# Patient Record
Sex: Male | Born: 1945 | Race: Black or African American | Hispanic: No | Marital: Married | Smoking: Former smoker
Health system: Southern US, Community
[De-identification: ages and names within clinical notes are randomized; demographics above are authoritative.]

## PROBLEM LIST (undated history)

## (undated) DIAGNOSIS — G629 Polyneuropathy, unspecified: Secondary | ICD-10-CM

## (undated) DIAGNOSIS — G40909 Epilepsy, unspecified, not intractable, without status epilepticus: Secondary | ICD-10-CM

## (undated) DIAGNOSIS — K859 Acute pancreatitis without necrosis or infection, unspecified: Secondary | ICD-10-CM

## (undated) DIAGNOSIS — E785 Hyperlipidemia, unspecified: Secondary | ICD-10-CM

## (undated) DIAGNOSIS — C801 Malignant (primary) neoplasm, unspecified: Secondary | ICD-10-CM

## (undated) DIAGNOSIS — N184 Chronic kidney disease, stage 4 (severe): Secondary | ICD-10-CM

## (undated) DIAGNOSIS — K219 Gastro-esophageal reflux disease without esophagitis: Secondary | ICD-10-CM

## (undated) DIAGNOSIS — B1920 Unspecified viral hepatitis C without hepatic coma: Secondary | ICD-10-CM

## (undated) DIAGNOSIS — M2010 Hallux valgus (acquired), unspecified foot: Secondary | ICD-10-CM

## (undated) DIAGNOSIS — C61 Malignant neoplasm of prostate: Secondary | ICD-10-CM

## (undated) DIAGNOSIS — F32A Depression, unspecified: Secondary | ICD-10-CM

## (undated) DIAGNOSIS — F329 Major depressive disorder, single episode, unspecified: Secondary | ICD-10-CM

## (undated) DIAGNOSIS — M5430 Sciatica, unspecified side: Secondary | ICD-10-CM

## (undated) DIAGNOSIS — I69322 Dysarthria following cerebral infarction: Secondary | ICD-10-CM

## (undated) DIAGNOSIS — H35039 Hypertensive retinopathy, unspecified eye: Secondary | ICD-10-CM

## (undated) DIAGNOSIS — IMO0001 Reserved for inherently not codable concepts without codable children: Secondary | ICD-10-CM

## (undated) DIAGNOSIS — E11319 Type 2 diabetes mellitus with unspecified diabetic retinopathy without macular edema: Secondary | ICD-10-CM

## (undated) DIAGNOSIS — E119 Type 2 diabetes mellitus without complications: Secondary | ICD-10-CM

## (undated) DIAGNOSIS — H269 Unspecified cataract: Secondary | ICD-10-CM

## (undated) DIAGNOSIS — I10 Essential (primary) hypertension: Secondary | ICD-10-CM

## (undated) DIAGNOSIS — I639 Cerebral infarction, unspecified: Secondary | ICD-10-CM

## (undated) DIAGNOSIS — J449 Chronic obstructive pulmonary disease, unspecified: Secondary | ICD-10-CM

## (undated) DIAGNOSIS — K31811 Angiodysplasia of stomach and duodenum with bleeding: Secondary | ICD-10-CM

## (undated) DIAGNOSIS — R41841 Cognitive communication deficit: Secondary | ICD-10-CM

## (undated) HISTORY — PX: FOOT SURGERY: SHX648

## (undated) HISTORY — DX: Type 2 diabetes mellitus with unspecified diabetic retinopathy without macular edema: E11.319

## (undated) HISTORY — DX: Chronic obstructive pulmonary disease, unspecified: J44.9

## (undated) HISTORY — DX: Sciatica, unspecified side: M54.30

## (undated) HISTORY — PX: OTHER SURGICAL HISTORY: SHX169

## (undated) HISTORY — DX: Polyneuropathy, unspecified: G62.9

## (undated) HISTORY — DX: Cerebral infarction, unspecified: I63.9

## (undated) HISTORY — DX: Hyperlipidemia, unspecified: E78.5

## (undated) HISTORY — PX: CATARACT EXTRACTION: SUR2

## (undated) HISTORY — DX: Hypertensive retinopathy, unspecified eye: H35.039

## (undated) HISTORY — DX: Hallux valgus (acquired), unspecified foot: M20.10

## (undated) HISTORY — PX: EYE SURGERY: SHX253

## (undated) HISTORY — DX: Unspecified cataract: H26.9

---

## 1998-01-15 ENCOUNTER — Emergency Department (HOSPITAL_COMMUNITY): Admission: EM | Admit: 1998-01-15 | Discharge: 1998-01-15 | Payer: Self-pay | Admitting: Emergency Medicine

## 2001-04-04 ENCOUNTER — Emergency Department (HOSPITAL_COMMUNITY): Admission: EM | Admit: 2001-04-04 | Discharge: 2001-04-04 | Payer: Self-pay | Admitting: Emergency Medicine

## 2001-04-04 ENCOUNTER — Encounter: Payer: Self-pay | Admitting: Emergency Medicine

## 2003-05-24 ENCOUNTER — Emergency Department (HOSPITAL_COMMUNITY): Admission: EM | Admit: 2003-05-24 | Discharge: 2003-05-24 | Payer: Self-pay

## 2003-09-23 ENCOUNTER — Emergency Department (HOSPITAL_COMMUNITY): Admission: EM | Admit: 2003-09-23 | Discharge: 2003-09-23 | Payer: Self-pay | Admitting: Emergency Medicine

## 2003-09-25 ENCOUNTER — Emergency Department (HOSPITAL_COMMUNITY): Admission: EM | Admit: 2003-09-25 | Discharge: 2003-09-25 | Payer: Self-pay | Admitting: Emergency Medicine

## 2004-06-18 ENCOUNTER — Emergency Department (HOSPITAL_COMMUNITY): Admission: EM | Admit: 2004-06-18 | Discharge: 2004-06-18 | Payer: Self-pay | Admitting: Family Medicine

## 2005-12-21 ENCOUNTER — Emergency Department (HOSPITAL_COMMUNITY): Admission: EM | Admit: 2005-12-21 | Discharge: 2005-12-21 | Payer: Self-pay | Admitting: Family Medicine

## 2005-12-26 ENCOUNTER — Emergency Department (HOSPITAL_COMMUNITY): Admission: EM | Admit: 2005-12-26 | Discharge: 2005-12-26 | Payer: Self-pay | Admitting: Family Medicine

## 2008-09-23 ENCOUNTER — Emergency Department (HOSPITAL_COMMUNITY): Admission: EM | Admit: 2008-09-23 | Discharge: 2008-09-23 | Payer: Self-pay | Admitting: Emergency Medicine

## 2010-11-10 ENCOUNTER — Inpatient Hospital Stay (INDEPENDENT_AMBULATORY_CARE_PROVIDER_SITE_OTHER)
Admission: RE | Admit: 2010-11-10 | Discharge: 2010-11-10 | Disposition: A | Discharge status: Home / Self Care | Payer: Self-pay | Source: Ambulatory Visit | Attending: Family Medicine | Admitting: Family Medicine

## 2010-11-10 DIAGNOSIS — L84 Corns and callosities: Secondary | ICD-10-CM

## 2011-05-16 ENCOUNTER — Emergency Department (HOSPITAL_COMMUNITY): Payer: Self-pay

## 2011-05-16 ENCOUNTER — Emergency Department (HOSPITAL_COMMUNITY)
Admission: EM | Admit: 2011-05-16 | Discharge: 2011-05-16 | Disposition: A | Discharge status: Home / Self Care | Payer: Self-pay | Attending: Emergency Medicine | Admitting: Emergency Medicine

## 2011-05-16 ENCOUNTER — Encounter (HOSPITAL_COMMUNITY): Payer: Self-pay | Admitting: *Deleted

## 2011-05-16 DIAGNOSIS — R112 Nausea with vomiting, unspecified: Secondary | ICD-10-CM | POA: Insufficient documentation

## 2011-05-16 DIAGNOSIS — R109 Unspecified abdominal pain: Secondary | ICD-10-CM | POA: Insufficient documentation

## 2011-05-16 DIAGNOSIS — K859 Acute pancreatitis without necrosis or infection, unspecified: Secondary | ICD-10-CM | POA: Insufficient documentation

## 2011-05-16 LAB — COMPREHENSIVE METABOLIC PANEL
ALT: 57 U/L — ABNORMAL HIGH (ref 0–53)
AST: 44 U/L — ABNORMAL HIGH (ref 0–37)
Albumin: 3.2 g/dL — ABNORMAL LOW (ref 3.5–5.2)
Chloride: 101 mEq/L (ref 96–112)
Creatinine, Ser: 0.78 mg/dL (ref 0.50–1.35)
Sodium: 137 mEq/L (ref 135–145)
Total Bilirubin: 0.3 mg/dL (ref 0.3–1.2)

## 2011-05-16 LAB — URINALYSIS, ROUTINE W REFLEX MICROSCOPIC
Glucose, UA: >1000 mg/dL — AB
Hgb urine dipstick: NEGATIVE
Ketones, ur: NEGATIVE mg/dL
Leukocytes, UA: NEGATIVE
pH: 6.5 (ref 5.0–8.0)

## 2011-05-16 LAB — URINE MICROSCOPIC-ADD ON

## 2011-05-16 LAB — CBC
MCHC: 36.3 g/dL — ABNORMAL HIGH (ref 30.0–36.0)
Platelets: 133 10*3/uL — ABNORMAL LOW (ref 150–400)
RDW: 11.1 % — ABNORMAL LOW (ref 11.5–15.5)
WBC: 4.7 10*3/uL (ref 4.0–10.5)

## 2011-05-16 LAB — DIFFERENTIAL
Basophils Absolute: 0 10*3/uL (ref 0.0–0.1)
Basophils Relative: 0 % (ref 0–1)
Lymphocytes Relative: 54 % — ABNORMAL HIGH (ref 12–46)
Monocytes Absolute: 0.4 10*3/uL (ref 0.1–1.0)
Neutro Abs: 1.7 10*3/uL (ref 1.7–7.7)
Neutrophils Relative %: 35 % — ABNORMAL LOW (ref 43–77)

## 2011-05-16 LAB — OCCULT BLOOD, POC DEVICE: Fecal Occult Bld: NEGATIVE

## 2011-05-16 MED ORDER — HYDROCODONE-ACETAMINOPHEN 5-325 MG PO TABS: 1.0000 | ORAL_TABLET | Freq: Four times a day (QID) | ORAL | Status: AC | PRN

## 2011-05-16 MED ORDER — ONDANSETRON 8 MG PO TBDP: 8.0000 mg | ORAL_TABLET | Freq: Three times a day (TID) | ORAL | Status: AC | PRN

## 2011-05-16 MED ORDER — ONDANSETRON HCL 4 MG/2ML IJ SOLN
4.0000 mg | Freq: Once | INTRAMUSCULAR | Status: AC
  Administered 2011-05-16: 4 mg via INTRAVENOUS
  Filled 2011-05-16: qty 2

## 2011-05-16 MED ORDER — MORPHINE SULFATE 4 MG/ML IJ SOLN
4.0000 mg | Freq: Once | INTRAMUSCULAR | Status: AC
  Administered 2011-05-16: 4 mg via INTRAVENOUS
  Filled 2011-05-16: qty 1

## 2011-05-16 MED ORDER — SODIUM CHLORIDE 0.9 % IV SOLN
999.0000 mL | INTRAVENOUS | Status: DC
  Administered 2011-05-16: 999 mL via INTRAVENOUS

## 2011-05-16 NOTE — ED Provider Notes (Signed)
History     CSN: [id number]  Arrival date & time 05/16/11  0236   First MD Initiated Contact with Patient 05/16/11 [phone]      Chief Complaint  Patient presents with  . Abdominal Pain    (Consider location/radiation/quality/duration/timing/severity/associated sxs/prior treatment) HPI Patient states he's been having abdominal pain ongoing for the last 5 days. It is primarily been in the right upper abdomen but also seems to be in the epigastric region. It never goes to the left side. Patient did notice when he ate this morning he had nausea and vomiting. The pain became more severe this evening so he came to the emergency room. It does seem to wax and wane somewhat. nothing specifically brings it on. He denies any hematuria or dysuria. He has not had any fevers coughing or chest pain. He did notice that earlier in the week he had some dark stools. History reviewed. No pertinent past medical history.  History reviewed. No pertinent past surgical history.  History reviewed. No pertinent family history.  History  Substance Use Topics  . Smoking status: Current Everyday Smoker  . Smokeless tobacco: Not on file  . Alcohol Use: Yes      Review of Systems  All other systems reviewed and are negative.    Allergies  Review of patient's allergies indicates no known allergies.  Home Medications  No current outpatient prescriptions on file.  BP 159/84  Pulse 90  Temp(Src) 98.5 F (36.9 C) (Oral)  Resp 18  SpO2 98%  Physical Exam  Nursing note and vitals reviewed. Constitutional: He appears well-developed and well-nourished. No distress.  HENT:  Head: Normocephalic and atraumatic.  Right Ear: External ear normal.  Left Ear: External ear normal.  Eyes: Conjunctivae are normal. Right eye exhibits no discharge. Left eye exhibits no discharge. No scleral icterus.  Neck: Neck supple. No tracheal deviation present.  Cardiovascular: Normal rate, regular rhythm and intact distal  pulses.   Pulmonary/Chest: Effort normal and breath sounds normal. No stridor. No respiratory distress. He has no wheezes. He has no rales.  Abdominal: Soft. Bowel sounds are normal. He exhibits no distension. There is tenderness in the right upper quadrant and epigastric area. There is no rebound, no guarding and no CVA tenderness. No hernia.  Musculoskeletal: He exhibits no edema and no tenderness.  Neurological: He is alert. He has normal strength. No sensory deficit. Cranial nerve deficit:  no gross defecits noted. He exhibits normal muscle tone. He displays no seizure activity. Coordination normal.  Skin: Skin is warm and dry. No rash noted.  Psychiatric: He has a normal mood and affect.    ED Course  Procedures (including critical care time)  Labs Reviewed  CBC - Abnormal; Notable for the following:    MCHC 36.3 (*)    RDW 11.1 (*)    Platelets 133 (*)    All other components within normal limits  DIFFERENTIAL - Abnormal; Notable for the following:    Neutrophils Relative 35 (*)    Lymphocytes Relative 54 (*)    All other components within normal limits  COMPREHENSIVE METABOLIC PANEL - Abnormal; Notable for the following:    Glucose, Bld 318 (*)    Albumin 3.2 (*)    AST 44 (*)    ALT 57 (*)    All other components within normal limits  LIPASE, BLOOD - Abnormal; Notable for the following:    Lipase 177 (*)    All other components within normal limits  URINALYSIS, ROUTINE  W REFLEX MICROSCOPIC - Abnormal; Notable for the following:    APPearance TURBID (*)    Glucose, UA >1000 (*)    Protein, ur >300 (*)    All other components within normal limits  OCCULT BLOOD, POC DEVICE  URINE MICROSCOPIC-ADD ON  OCCULT BLOOD GASTRIC / DUODENUM   US Abdomen Complete  05/16/2011  The *RADIOLOGY REPORT*  Clinical Data:  Right lateral abdominal pain for 5 days.  Nausea and vomiting.  Black stools.  COMPLETE ABDOMINAL ULTRASOUND  Comparison:  None.  Findings:  Gallbladder:  No gallstones,  gallbladder wall thickening, or pericholecystic fluid.  Common bile duct:  Normal caliber.  Maximal diameter measured at 5 mm.  Liver:  No focal lesion identified.  Within normal limits in parenchymal echogenicity.  IVC:  Appears normal.  Pancreas:  No focal abnormality seen.  Spleen:  Spleen length measures 5.7 cm.  Normal parenchymal echotexture.  Right Kidney:  Right kidney measures 12.5 cm length.  No hydronephrosis.  Left Kidney:  Left kidney measures 12.3 cm length.  No hydronephrosis.  Abdominal aorta:  No aneurysm identified.  IMPRESSION: Negative abdominal ultrasound.  Original Report Authenticated By: Marlon Pel, M.D.      MDM  Pt states his symptoms started after an alcohol binge.  Lab tests suggests mild pancreatitis.  No signs of gallstones on [place].  Pt without vomiting here in the ED and has been able to take PO and keep down fluids.   Will dc home on liquid diet for next 48 hours with medications for pain and nausea.  Pt counseled on alcohol use.        Celene Kras, MD 05/16/11 [phone]

## 2011-05-16 NOTE — ED Notes (Signed)
He pt is c/o rt lat abd pain for 5 days.  nv this am

## 2011-05-16 NOTE — ED Notes (Addendum)
Pt stated he had some alcoholic beverages on Saturday and started having some abdominal pain on the right side that radiates to the middle part of his stomach.  Pt stated he had some pancakes yesterday morning and had an episode of nausea and vomited.  Monday-Wednesday he states he had black stools.  Pt states that when he is laying down he "tastes blood".

## 2012-05-06 ENCOUNTER — Emergency Department (HOSPITAL_COMMUNITY)
Admission: EM | Admit: 2012-05-06 | Discharge: 2012-05-06 | Disposition: A | Discharge status: Home / Self Care | Payer: Self-pay | Attending: Emergency Medicine | Admitting: Emergency Medicine

## 2012-05-06 ENCOUNTER — Emergency Department (HOSPITAL_COMMUNITY): Payer: Self-pay

## 2012-05-06 ENCOUNTER — Encounter (HOSPITAL_COMMUNITY): Payer: Self-pay | Admitting: *Deleted

## 2012-05-06 DIAGNOSIS — F17200 Nicotine dependence, unspecified, uncomplicated: Secondary | ICD-10-CM | POA: Insufficient documentation

## 2012-05-06 DIAGNOSIS — R05 Cough: Secondary | ICD-10-CM | POA: Insufficient documentation

## 2012-05-06 DIAGNOSIS — J392 Other diseases of pharynx: Secondary | ICD-10-CM | POA: Insufficient documentation

## 2012-05-06 DIAGNOSIS — R0602 Shortness of breath: Secondary | ICD-10-CM | POA: Insufficient documentation

## 2012-05-06 DIAGNOSIS — R059 Cough, unspecified: Secondary | ICD-10-CM | POA: Insufficient documentation

## 2012-05-06 DIAGNOSIS — R042 Hemoptysis: Secondary | ICD-10-CM | POA: Insufficient documentation

## 2012-05-06 LAB — CBC WITH DIFFERENTIAL/PLATELET
Basophils Absolute: 0 10*3/uL (ref 0.0–0.1)
Basophils Relative: 0 % (ref 0–1)
Eosinophils Absolute: 0.1 10*3/uL (ref 0.0–0.7)
MCH: 32.5 pg (ref 26.0–34.0)
MCHC: 36.3 g/dL — ABNORMAL HIGH (ref 30.0–36.0)
Neutrophils Relative %: 38 % — ABNORMAL LOW (ref 43–77)
Platelets: 121 10*3/uL — ABNORMAL LOW (ref 150–400)

## 2012-05-06 LAB — BASIC METABOLIC PANEL
GFR calc Af Amer: >90 mL/min (ref >90)
GFR calc non Af Amer: >90 mL/min (ref >90)
Potassium: 3 mEq/L — ABNORMAL LOW (ref 3.5–5.1)
Sodium: 137 mEq/L (ref 135–145)

## 2012-05-06 MED ORDER — HYDROCODONE-ACETAMINOPHEN 5-325 MG PO TABS: 2.0000 | ORAL_TABLET | ORAL | Status: AC | PRN

## 2012-05-06 MED ORDER — IOHEXOL 300 MG/ML  SOLN
80.0000 mL | Freq: Once | INTRAMUSCULAR | Status: AC | PRN
  Administered 2012-05-06: 80 mL via INTRAVENOUS

## 2012-05-06 MED ORDER — SULFAMETHOXAZOLE-TRIMETHOPRIM 800-160 MG PO TABS: 1.0000 | ORAL_TABLET | Freq: Two times a day (BID) | ORAL | Status: DC

## 2012-05-06 NOTE — ED Notes (Signed)
Coughing up blood all day yesterday; and cont. To do so after going to bed. Very mild in spit. But states real red.

## 2012-05-06 NOTE — ED Provider Notes (Signed)
Medical screening examination/treatment/procedure(s) were performed by non-physician practitioner and as supervising physician I was immediately available for consultation/collaboration.    Nelia Shi, MD 05/06/12 [phone]

## 2012-05-06 NOTE — ED Notes (Signed)
Took pt's iv out before pt left for discharge

## 2012-05-06 NOTE — ED Notes (Signed)
Pt presents to department for evaluation of hemoptysis. States he has been coughing up blood x1 day. Respirations unlabored. Lung sounds clear and equal bilaterally. Denies chest pain. Denies fever. Pt is alert and oriented x4. No signs of acute distress noted.

## 2012-05-06 NOTE — ED Provider Notes (Signed)
History     CSN: [id number]  Arrival date & time 05/06/12  1191   First MD Initiated Contact with Patient 05/06/12 0645      Chief Complaint  Patient presents with  . Hemoptysis    (Consider location/radiation/quality/duration/timing/severity/associated sxs/prior treatment) Patient is a 67 y.o. male presenting with cough. The history is provided by the patient. No language interpreter was used.  Cough This is a new problem. The problem occurs constantly. The problem has been gradually worsening. The cough is productive of bloody sputum. There has been no fever. Associated symptoms include shortness of breath. He has tried nothing for the symptoms. The treatment provided no relief. He is not a smoker. His past medical history does not include pneumonia, COPD or asthma.   Pt complains of coughing up blood for 2 months.   Pt reports he also has a knot on the right side of his chest a long clavicle History reviewed. No pertinent past medical history.  History reviewed. No pertinent past surgical history.  No family history on file.  History  Substance Use Topics  . Smoking status: Current Every Day Smoker  . Smokeless tobacco: Not on file  . Alcohol Use: Yes      Review of Systems  Respiratory: Positive for cough and shortness of breath.   All other systems reviewed and are negative.    Allergies  Penicillins  Home Medications  No current outpatient prescriptions on file.  BP 170/90  Pulse 102  Temp 98.6 F (37 C) (Oral)  Resp 18  SpO2 99%  Physical Exam  Nursing note and vitals reviewed. Constitutional: He is oriented to person, place, and time. He appears well-developed and well-nourished.  HENT:  Head: Normocephalic and atraumatic.  Eyes: Conjunctivae normal are normal. Pupils are equal, round, and reactive to light.  Neck: Normal range of motion. Neck supple.  Cardiovascular: Normal rate.   Pulmonary/Chest: Effort normal.  Abdominal: Soft. Bowel sounds  are normal.  Musculoskeletal: Normal range of motion.  Neurological: He is alert and oriented to person, place, and time.  Skin: Skin is warm.  Psychiatric: He has a normal mood and affect.    ED Course  Procedures (including critical care time)   Labs Reviewed  CBC WITH DIFFERENTIAL  BASIC METABOLIC PANEL   No results found.   1. Hemoptysis   2. Lesion of throat       MDM   I spoke to Dr. Purcell Nails about dark area in pt's mouth.   He advised to have pt call his office on Monday to be seen for evaluation. Blood may be second to sinus problem.  Pt is allergic to pcn.   I will treat with bactrim.   I counseled pt about necessity of evaluation of dark area in mouth and of my concerns that this could be throat cancer.   Pt understands and agreees with plan.       Lonia Skinner E[place], [place] 05/06/12 [phone]

## 2012-07-04 ENCOUNTER — Encounter (HOSPITAL_COMMUNITY): Payer: Self-pay | Admitting: *Deleted

## 2012-07-04 ENCOUNTER — Emergency Department (HOSPITAL_COMMUNITY)
Admission: EM | Admit: 2012-07-04 | Discharge: 2012-07-04 | Disposition: A | Discharge status: Home / Self Care | Payer: Self-pay | Attending: Emergency Medicine | Admitting: Emergency Medicine

## 2012-07-04 ENCOUNTER — Emergency Department (HOSPITAL_COMMUNITY): Payer: Self-pay

## 2012-07-04 DIAGNOSIS — Z8719 Personal history of other diseases of the digestive system: Secondary | ICD-10-CM | POA: Insufficient documentation

## 2012-07-04 DIAGNOSIS — Z87828 Personal history of other (healed) physical injury and trauma: Secondary | ICD-10-CM | POA: Insufficient documentation

## 2012-07-04 DIAGNOSIS — F17200 Nicotine dependence, unspecified, uncomplicated: Secondary | ICD-10-CM | POA: Insufficient documentation

## 2012-07-04 DIAGNOSIS — R58 Hemorrhage, not elsewhere classified: Secondary | ICD-10-CM | POA: Insufficient documentation

## 2012-07-04 DIAGNOSIS — R109 Unspecified abdominal pain: Secondary | ICD-10-CM | POA: Insufficient documentation

## 2012-07-04 HISTORY — DX: Acute pancreatitis without necrosis or infection, unspecified: K85.90

## 2012-07-04 LAB — URINALYSIS, ROUTINE W REFLEX MICROSCOPIC
Bilirubin Urine: NEGATIVE
Glucose, UA: 100 mg/dL — AB
Hgb urine dipstick: NEGATIVE
Ketones, ur: NEGATIVE mg/dL
Protein, ur: 100 mg/dL — AB

## 2012-07-04 LAB — CBC WITH DIFFERENTIAL/PLATELET
Eosinophils Absolute: 0.1 10*3/uL (ref 0.0–0.7)
Eosinophils Relative: 2 % (ref 0–5)
Lymphs Abs: 2.2 10*3/uL (ref 0.7–4.0)
MCH: 31.9 pg (ref 26.0–34.0)
MCV: 87 fL (ref 78.0–100.0)
Monocytes Absolute: 0.4 10*3/uL (ref 0.1–1.0)
Monocytes Relative: 11 % (ref 3–12)
Platelets: 129 10*3/uL — ABNORMAL LOW (ref 150–400)
RBC: 4.54 MIL/uL (ref 4.22–5.81)

## 2012-07-04 LAB — COMPREHENSIVE METABOLIC PANEL
AST: 47 U/L — ABNORMAL HIGH (ref 0–37)
Albumin: 3.6 g/dL (ref 3.5–5.2)
Alkaline Phosphatase: 60 U/L (ref 39–117)
Chloride: 100 mEq/L (ref 96–112)
Creatinine, Ser: 0.8 mg/dL (ref 0.50–1.35)
Potassium: 3.7 mEq/L (ref 3.5–5.1)
Total Bilirubin: 0.2 mg/dL — ABNORMAL LOW (ref 0.3–1.2)

## 2012-07-04 LAB — URINE MICROSCOPIC-ADD ON

## 2012-07-04 MED ORDER — SODIUM CHLORIDE 0.9 % IV BOLUS (SEPSIS)
500.0000 mL | Freq: Once | INTRAVENOUS | Status: AC
  Administered 2012-07-04: 500 mL via INTRAVENOUS

## 2012-07-04 MED ORDER — OXYCODONE-ACETAMINOPHEN 5-325 MG PO TABS
1.0000 | ORAL_TABLET | Freq: Four times a day (QID) | ORAL | Status: DC | PRN
Start: 2012-07-04 — End: 2014-01-08

## 2012-07-04 MED ORDER — IOHEXOL 300 MG/ML  SOLN
75.0000 mL | Freq: Once | INTRAMUSCULAR | Status: AC | PRN
  Administered 2012-07-04: 75 mL via INTRAVENOUS

## 2012-07-04 NOTE — ED Notes (Signed)
Pt reports coughing up blood a couple of days ago. Also reports left flank pain x 2 days. Denies nausea, vomiting, or diarrhea at this time. States that he was supposed to follow up with an ENT but cannot afford to see one at this time. Pt denies dysuria, penile discharge or penile pain at this time.

## 2012-07-04 NOTE — ED Notes (Signed)
MD Pickering at bedside.  

## 2012-07-04 NOTE — ED Notes (Signed)
PT c/o intermittently coughing up blood.  He was seen here 3 weeks ago and referred to ENT specialist, but he doesn't have insurance so he did not go see specialist.  Pt also c/o L sided abd pain that radiates to LLQ, pain is intermittent and increases at night.  Pt is a smoker and does drink "straight liquor".

## 2012-07-04 NOTE — ED Notes (Signed)
[facility]G 161 

## 2012-07-04 NOTE — ED Provider Notes (Signed)
History     CSN: [id number]  Arrival date & time 07/04/12  1806   First MD Initiated Contact with Patient 07/04/12 1852      Chief Complaint  Patient presents with  . Hemoptysis    (Consider location/radiation/quality/duration/timing/severity/associated sxs/prior treatment) The history is provided by the patient.   patient states he's had some blood in his mouth over the last few months. He states it is worse at night. He states he wakes up with blood there sometimes. No other bleeding. States it is not episode blood he is coughing up. It is not blood that is throwing up. No other bleeding. No fevers. He also has had some upper abdominal pain. 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 He had been seen for the same thing a month or 2 ago and is post fall with ear nose and throat, but he never did. He states he has no insurance.  Past Medical History  Diagnosis Date  . Pancreatitis     History reviewed. No pertinent past surgical history.  No family history on file.  History  Substance Use Topics  . Smoking status: Current Every Day Smoker -- 0.50 packs/day    Types: Cigarettes  . Smokeless tobacco: Not on file  . Alcohol Use: Yes     Comment: binge drinks straight liquor      Review of Systems  Constitutional: Negative for activity change and appetite change.  HENT: Negative for nosebleeds and neck stiffness.   Eyes: Negative for pain.  Respiratory: Negative for chest tightness and shortness of breath.   Cardiovascular: Negative for chest pain and leg swelling.  Gastrointestinal: Positive for abdominal pain. Negative for nausea, vomiting and diarrhea.  Genitourinary: Negative for flank pain.  Musculoskeletal: Negative for back pain.  Skin: Negative for rash.  Allergic/Immunologic: Negative for environmental allergies and immunocompromised state.  Neurological: Negative for weakness, numbness and headaches.  Psychiatric/Behavioral: Negative for behavioral  problems.    Allergies  Penicillins  Home Medications   Current Outpatient Rx  Name  Route  Sig  Dispense  Refill  . oxyCODONE-acetaminophen (PERCOCET/ROXICET) 5-325 MG per tablet   Oral   Take 1-2 tablets by mouth every 6 (six) hours as needed for pain.   10 tablet   0     BP 156/86  Pulse 79  Temp(Src) 98.3 F (36.8 C) (Oral)  Resp 20  SpO2 99%  Physical Exam  Nursing note and vitals reviewed. Constitutional: He is oriented to person, place, and time. He appears well-developed and well-nourished.  HENT:  Head: Normocephalic and atraumatic.  No active bleeding seen. His approximate 1 cm hyperpigmented area along the right tonsillar pillar. Does not bleed with scraping. There is no mass.  Eyes: EOM are normal. Pupils are equal, round, and reactive to light.  Neck: Normal range of motion. Neck supple.  Cardiovascular: Normal rate, regular rhythm and normal heart sounds.   No murmur heard. Pulmonary/Chest: Effort normal and breath sounds normal.  Abdominal: Soft. Bowel sounds are normal. He exhibits no distension and no mass. There is no tenderness. There is no rebound and no guarding.  Minimal upper abdominal tenderness without rebound or guarding.  Musculoskeletal: Normal range of motion. He exhibits no edema.  Neurological: He is alert and oriented to person, place, and time. No cranial nerve deficit.  Skin: Skin is warm and dry.  No petechiae. No swollen lymph nodes  Psychiatric: He has a normal mood and affect.    ED Course  Procedures (  including critical care time)  Labs Reviewed  COMPREHENSIVE METABOLIC PANEL - Abnormal; Notable for the following:    Glucose, Bld 157 (*)    AST 47 (*)    ALT 59 (*)    Total Bilirubin 0.2 (*)    All other components within normal limits  CBC WITH DIFFERENTIAL - Abnormal; Notable for the following:    MCHC 36.7 (*)    Platelets 129 (*)    Neutrophils Relative 33 (*)    Neutro Abs 1.3 (*)    Lymphocytes Relative 54 (*)     All other components within normal limits  URINALYSIS, ROUTINE W REFLEX MICROSCOPIC - Abnormal; Notable for the following:    Glucose, UA 100 (*)    Protein, ur 100 (*)    All other components within normal limits  GLUCOSE, CAPILLARY - Abnormal; Notable for the following:    Glucose-Capillary 161 (*)    All other components within normal limits  LIPASE, BLOOD - Abnormal; Notable for the following:    Lipase 74 (*)    All other components within normal limits  URINE MICROSCOPIC-ADD ON - Abnormal; Notable for the following:    Squamous Epithelial / LPF FEW (*)    Casts HYALINE CASTS (*)    All other components within normal limits   Ct Soft Tissue Neck W Contrast  07/04/2012  *RADIOLOGY REPORT*  Clinical Data: Hemoptysis.  Palpable abnormalities in the neck at physical examination.  CT NECK WITH CONTRAST  Technique:  Multidetector CT imaging of the neck was performed with intravenous contrast.  Contrast: 75mL OMNIPAQUE IOHEXOL 300 MG/ML.  Comparison: No prior neck CT.  Cervical spine CT 05/24/2003.  Findings: No neck masses or significant lymphadenopathy.  No abnormal fluid collections in the neck to suggest abscess.  Normal appearing symmetric bilateral parotid and submandibular salivary glands.  Mild atherosclerosis involving the carotid bulbs bilaterally without significant stenosis.  Normal larynx and pharynx.  Normal thyroid gland.  Visualized superior mediastinum unremarkable apart from mild atherosclerosis involving the aortic arch.  Visualized lung apices clear with mild emphysematous changes.  Bone window images demonstrate degenerative changes throughout the cervical spine.  IMPRESSION:  1.  No acute abnormalities involving the neck. 2.  No neck masses or lymphadenopathy. 3.  Mild bilateral carotid atherosclerosis without stenosis.   Original Report Authenticated By: Hulan Saas, M.D.      1. Bleeding   2. Abdominal pain       MDM  Patient with blood in his throat at times.  Could be coming from his nose it could be coughing up. Previously had negative chest CT, but did have some nodules. He does have a pigmented area on his posterior pharynx. It is not raised. After discussion with Dr. Suszanne Conners, a CT of his neck was done with no clear masses. He will followup with Dr. Lazarus Salines, which really was supposed to followup after his last visit here. He does have some abdominal pain and a previous history of pancreatitis. Lipase is minimally elevated. His been tolerating orals and has not drank recently. He was discharged home some pain medicines.        Juliet Rude. Rubin Payor, MD 07/05/12 [phone]

## 2013-01-17 ENCOUNTER — Ambulatory Visit (INDEPENDENT_AMBULATORY_CARE_PROVIDER_SITE_OTHER): Payer: Medicare Other

## 2013-01-17 ENCOUNTER — Ambulatory Visit: Payer: Medicare Other

## 2013-01-17 VITALS — BP 161/95 | HR 98 | Resp 16

## 2013-01-17 DIAGNOSIS — M2012 Hallux valgus (acquired), left foot: Secondary | ICD-10-CM

## 2013-01-17 DIAGNOSIS — M21619 Bunion of unspecified foot: Secondary | ICD-10-CM | POA: Insufficient documentation

## 2013-01-17 DIAGNOSIS — M2010 Hallux valgus (acquired), unspecified foot: Secondary | ICD-10-CM

## 2013-01-17 HISTORY — DX: Hallux valgus (acquired), unspecified foot: M20.10

## 2013-01-17 NOTE — Progress Notes (Signed)
Gregory Nunez was seen today for postop visit. Patient is proxy five-week status post Austin bunionectomy left foot and hammertoe repair second digit left foot. X-rays taken today reveal intact K wire fixation of the first metatarsal with consolidation of the osteotomy no hyperostosis mild asymmetric joint space narrowing noted. Intact pin fixation second toe noted with good alignment of the digit being appreciated.  At this time pinning of second toe removed following a Betadine prep, triple antibiotic ointment and a Band-Aid dressing were applied, and Coflex strapping of the digits was performed. Patient will discontinue use of air fracture boot, and resume a lace up oxford or athletic shoe. Patient will also continue active and passive range of motion exercises of the great toe joint. Patient will followup in 2 months for additional postop x-rays and assessment  Alvan Dame DPM

## 2013-01-17 NOTE — Progress Notes (Signed)
Foot is sore, hurts at the upper part of my toe.  I ran out of pain medicine.  He mentioned maybe taking pin out today

## 2013-01-17 NOTE — Patient Instructions (Signed)
Discontinue air fracture boot. Maintained NSAIDs for pain. Resume athletic or oxford walking shoes

## 2013-01-22 ENCOUNTER — Ambulatory Visit (INDEPENDENT_AMBULATORY_CARE_PROVIDER_SITE_OTHER): Payer: Medicare Other | Admitting: Podiatry

## 2013-01-22 ENCOUNTER — Encounter: Payer: Self-pay | Admitting: Podiatry

## 2013-01-22 ENCOUNTER — Telehealth: Payer: Self-pay | Admitting: *Deleted

## 2013-01-22 VITALS — BP 158/97 | HR 98 | Temp 98.8°F | Resp 16 | Ht 71.0 in | Wt 185.0 lb

## 2013-01-22 DIAGNOSIS — L02619 Cutaneous abscess of unspecified foot: Secondary | ICD-10-CM

## 2013-01-22 MED ORDER — AMOXICILLIN 500 MG PO CAPS: 500.0000 mg | ORAL_CAPSULE | Freq: Three times a day (TID) | ORAL | Status: DC

## 2013-01-22 MED ORDER — HYDROCODONE-ACETAMINOPHEN 10-325 MG PO TABS: 1.0000 | ORAL_TABLET | Freq: Three times a day (TID) | ORAL | Status: DC | PRN

## 2013-01-22 NOTE — Telephone Encounter (Signed)
[facility] [phone] states pt presented with hardcopy rx states another was to be called in.  I checked Amoxicillin rx was electronically sent to [facility].  I will verbally send to [facility] per pt's request and cancel rx called to [facility] [phone].  Done 544pm.

## 2013-01-22 NOTE — Patient Instructions (Signed)
See Dr. Ralene Cork on Friday. If you develop any swelling fever or chills contact us and Doctor

## 2013-01-22 NOTE — Progress Notes (Signed)
Subjective:     Patient ID: Gregory Nunez, male   DOB: 1945/05/03, 67 y.o.   MRN: [id number]  Foot Pain   my left second toe has started to hurt over the last several days. I'm also feeling like I. have some swelling in my ankle. Patient states he had his pin removed 3 days ago.   Review of Systems  All other systems reviewed and are negative.       Objective:   Physical Exam  Nursing note and vitals reviewed. Cardiovascular: Intact distal pulses.    Neurological was intact. Incision sites appear to be healing well. Mild edema noted in the left forefoot and left ankle. Negative for lymph node distention or calor. The second toe is moderately edematous with some pain in the plantar aspect of the toe itself. No drainage noted at this time.    Assessment:     Possible soft tissue infection. Patient is wearing slippers so he may have inflamed his left foot.    Plan:     As a precautionary measure I am placing him on cephalexin 500 mg 3 times a day. Advised on returning to his surgical shoe. Advised on monitoring his temperature. If any changes should occur he is to contact us immediately or go to the emergency room. Reappoint for Dr. Juanetta Gosling to evaluate on Friday

## 2013-01-26 ENCOUNTER — Ambulatory Visit: Payer: Medicare Other

## 2013-02-07 ENCOUNTER — Ambulatory Visit (INDEPENDENT_AMBULATORY_CARE_PROVIDER_SITE_OTHER): Payer: Medicare Other

## 2013-02-07 ENCOUNTER — Other Ambulatory Visit: Payer: Self-pay

## 2013-02-07 VITALS — BP 162/96 | HR 90 | Temp 97.6°F | Resp 12 | Ht 71.0 in | Wt 182.0 lb

## 2013-02-07 DIAGNOSIS — M2010 Hallux valgus (acquired), unspecified foot: Secondary | ICD-10-CM

## 2013-02-07 DIAGNOSIS — E1140 Type 2 diabetes mellitus with diabetic neuropathy, unspecified: Secondary | ICD-10-CM

## 2013-02-07 DIAGNOSIS — E1142 Type 2 diabetes mellitus with diabetic polyneuropathy: Secondary | ICD-10-CM

## 2013-02-07 DIAGNOSIS — Z9889 Other specified postprocedural states: Secondary | ICD-10-CM

## 2013-02-07 DIAGNOSIS — Q828 Other specified congenital malformations of skin: Secondary | ICD-10-CM

## 2013-02-07 DIAGNOSIS — M2012 Hallux valgus (acquired), left foot: Secondary | ICD-10-CM

## 2013-02-07 DIAGNOSIS — E1149 Type 2 diabetes mellitus with other diabetic neurological complication: Secondary | ICD-10-CM

## 2013-02-07 NOTE — Patient Instructions (Signed)

## 2013-02-07 NOTE — Progress Notes (Signed)
  Subjective:    Patient ID: Gregory Nunez, male    DOB: [birth year], 67 y.o.   MRN: [id number]  HPI Comments: DOS 12/11/12 '' THE LT. FOOT STILL SWOLLEN AND HURTING, ESPECIALLY WHEN PUTTING SHOES''  patient presents this time proxy 2 months status post [place] bunionectomy left foot with pin fixation. Patient is doing well however still exhibit edema. Should note however patient is not wearing his compression stocking and is walking on a pair flimsy slippers rather than shoes. Is wearing her stockings at night rather than during the day patient was corrected on those instructions at this time. ALSO, THE CALLUSES UNDER MY FEET NEED TO BE TRIM'' patient has a history of diabetes cocking factors. History peripheral neuropathy decreased epicritic and proprioceptive sensations to the toes. Patient does have keratoses sub fifth metatarsal area bilateral secondary to plantar flexed metatarsals showing hemorrhage a keratoses no active ulcers are noted. Understand was is within the postop. However the separate procedure carried at this time for debridement of keratoses the presence of diabetes cocking factors   Review of Systems  Constitutional: Negative.   HENT: Negative.   Eyes: Negative.   Respiratory: Positive for shortness of breath.   Cardiovascular: Negative.   Gastrointestinal: Negative.   Endocrine: Negative.   Genitourinary: Negative.   Musculoskeletal: Negative.   Skin: Negative.   Allergic/Immunologic: Negative.   Neurological: Positive for dizziness and light-headedness.  Hematological: Negative.   Psychiatric/Behavioral: Negative.        Objective:   Physical Exam Vascular status as follows pedal pulses palpable DP +2/4 l for PT +1 over 4 bilateral. Epicritic and proprioceptive sensations are diminished on Semmes Weinstein testing to the forefoot and digits. Hair growth is absent bilateral no open wounds or ulcerations noted. There is keratoses sub-fifth MP. Bilateral. There is  significant for 20+2 edema left foot in particular second toe and first MTP joint left. This is consistent with postop course. X-rays reviewed reveal good consolidation of the osteotomy sites first and second toe. Intact pin fixation first metatarsal noted.  Patient is interested in diabetic accident shoes we'll obtain authorization from his primary care physician for appropriate depth shoes. We'll followup with him next month for appropriate postop followup and diabetic foot care is needed.       Assessment & Plan:  Good postop progress to this point on left foot however recommend use of compression stockings during the day. Maintain a firm stable soled shoe at all times. Will also follow up on obtaining appropriate diabetic shoes and custom insoles. Followup in one month for continued surgical postop followup and diabetic foot care.  Or Ralene Cork DPM

## 2013-02-13 ENCOUNTER — Ambulatory Visit: Payer: Medicare Other | Admitting: *Deleted

## 2013-02-13 NOTE — Progress Notes (Signed)
Pt was measured and molded for diabetic shoes

## 2013-02-28 ENCOUNTER — Telehealth: Payer: Self-pay | Admitting: *Deleted

## 2013-02-28 NOTE — Telephone Encounter (Signed)
Pt states he is very concerned about the surgery on his foot, his toe is still dark and he wasn't even sure he understood what he the surgery was for.  I called [phone] and pt states he was sorry, but he'd been listening to people who didn't know about his foot and he got scared.  I asked the pt if he was having any problems and he said no, the toe was fine a little dark, but he knew that was to be expected.  Pt states disregard the message.

## 2013-03-09 ENCOUNTER — Ambulatory Visit: Payer: Medicare Other

## 2013-03-13 ENCOUNTER — Telehealth: Payer: Self-pay | Admitting: *Deleted

## 2013-03-13 NOTE — Telephone Encounter (Signed)
Pt asked about diabetic shoes.  Referred to schedulers for scheduling an appt to pick up diabetic shoes.

## 2013-03-21 ENCOUNTER — Ambulatory Visit (INDEPENDENT_AMBULATORY_CARE_PROVIDER_SITE_OTHER): Payer: Medicare Other

## 2013-03-21 VITALS — BP 156/87 | HR 100 | Resp 12

## 2013-03-21 DIAGNOSIS — M2012 Hallux valgus (acquired), left foot: Secondary | ICD-10-CM

## 2013-03-21 DIAGNOSIS — M2010 Hallux valgus (acquired), unspecified foot: Secondary | ICD-10-CM

## 2013-03-21 DIAGNOSIS — Z9889 Other specified postprocedural states: Secondary | ICD-10-CM

## 2013-03-21 DIAGNOSIS — Q828 Other specified congenital malformations of skin: Secondary | ICD-10-CM

## 2013-03-21 DIAGNOSIS — E1149 Type 2 diabetes mellitus with other diabetic neurological complication: Secondary | ICD-10-CM

## 2013-03-21 NOTE — Progress Notes (Signed)
   Subjective:    Patient ID: Gregory Nunez, male    DOB: [birth year], 67 y.o.   MRN: [id number]  HPI Comments: ''the foot doing ok ,but still swollen and have discoloration'' and pick up diabetic shoes  Diabetes      Review of Systems deferred at this visit     Objective:   Physical Exam Neurovascular status is intact with pedal pulses palpable DP postal for PT plus one over 4 bilateral Refill time 3 seconds all digits epicritic and proprioceptive sensations intact although diminished on [facility] testing consistent with diabetic neuropathy. The neuropathy affecting the toes forefoot abduction to his left foot and leg and ankle. Patient is reduced and abnormal sensations. No significant pain or discomfort noted for postoperative standpoint is four-month status post bunionectomy and hammertoe repair second both of left foot incisions well coapted and healed x-rays reveal good positions of the osteotomy site fixation good clinical and radiographic findings noted. This time patient is dispensed 1 pair of diabetic accident shoes and 3 pairs of custom molded dual density Plastizote inlays which fit and contour well full contact the patient's foot and arch. Patient given written instructions and use the shoes and insoles we'll followup in 2-3 months for continued diabetic foot and palliative care. Patient is a keratoses sub-5 bilateral secondary to plantar flexed metatarsals also on the right foot has interdigital keratoses second and hallux due to overlapping hammertoe and bunion deformity. The left foot is status post repair doing well       Assessment & Plan:  Assessment good postop progress 4 months postop bunionectomy and hammertoe repair left foot patient discharged from a surgical standpoint will continue with complex compression stocking if needed continue with range of motion exercises as recommended. Diabetic shoes and insoles dispensed with instructions at this time that fit and  contour well. Patient be recheck in 2 months for followup continued palliative care in the future as needed advised to contact us any changes or exacerbations in the interim.  Alvan Dame DPM

## 2013-03-21 NOTE — Patient Instructions (Signed)
Diabetes and Foot Care Diabetes may cause you to have problems because of poor blood supply (circulation) to your feet and legs. This may cause the skin on your feet to become thinner, break easier, and heal more slowly. Your skin may become dry, and the skin may peel and crack. You may also have nerve damage in your legs and feet causing decreased feeling in them. You may not notice minor injuries to your feet that could lead to infections or more serious problems. Taking care of your feet is one of the most important things you can do for yourself.  HOME CARE INSTRUCTIONS  Wear shoes at all times, even in the house. Do not go barefoot. Bare feet are easily injured.  Check your feet daily for blisters, cuts, and redness. If you cannot see the bottom of your feet, use a mirror or ask someone for help.  Wash your feet with warm water (do not use hot water) and mild soap. Then pat your feet and the areas between your toes until they are completely dry. Do not soak your feet as this can dry your skin.  Apply a moisturizing lotion or petroleum jelly (that does not contain alcohol and is unscented) to the skin on your feet and to dry, brittle toenails. Do not apply lotion between your toes.  Trim your toenails straight across. Do not dig under them or around the cuticle. File the edges of your nails with an emery board or nail file.  Do not cut corns or calluses or try to remove them with medicine.  Wear clean socks or stockings every day. Make sure they are not too tight. Do not wear knee-high stockings since they may decrease blood flow to your legs.  Wear shoes that fit properly and have enough cushioning. To break in new shoes, wear them for just a few hours a day. This prevents you from injuring your feet. Always look in your shoes before you put them on to be sure there are no objects inside.  Do not cross your legs. This may decrease the blood flow to your feet.  If you find a minor scrape,  cut, or break in the skin on your feet, keep it and the skin around it clean and dry. These areas may be cleansed with mild soap and water. Do not cleanse the area with peroxide, alcohol, or iodine.  When you remove an adhesive bandage, be sure not to damage the skin around it.  If you have a wound, look at it several times a day to make sure it is healing.  Do not use heating pads or hot water bottles. They may burn your skin. If you have lost feeling in your feet or legs, you may not know it is happening until it is too late.  Make sure your health care provider performs a complete foot exam at least annually or more often if you have foot problems. Report any cuts, sores, or bruises to your health care provider immediately. SEEK MEDICAL CARE IF:   You have an injury that is not healing.  You have cuts or breaks in the skin.  You have an ingrown nail.  You notice redness on your legs or feet.  You feel burning or tingling in your legs or feet.  You have pain or cramps in your legs and feet.  Your legs or feet are numb.  Your feet always feel cold. SEEK IMMEDIATE MEDICAL CARE IF:   There is increasing redness,   swelling, or pain in or around a wound.  There is a red line that goes up your leg.  Pus is coming from a wound.  You develop a fever or as directed by your health care provider.  You notice a bad smell coming from an ulcer or wound. Document Released: 04/02/2000 Document Revised: 12/06/2012 Document Reviewed: 09/12/2012 Exit[employer] Patient Information 2014 [employer].  

## 2013-05-23 ENCOUNTER — Ambulatory Visit (INDEPENDENT_AMBULATORY_CARE_PROVIDER_SITE_OTHER): Payer: Medicare Other

## 2013-05-23 VITALS — BP 168/96 | HR 98 | Resp 12

## 2013-05-23 DIAGNOSIS — E1142 Type 2 diabetes mellitus with diabetic polyneuropathy: Secondary | ICD-10-CM

## 2013-05-23 DIAGNOSIS — E1140 Type 2 diabetes mellitus with diabetic neuropathy, unspecified: Secondary | ICD-10-CM

## 2013-05-23 DIAGNOSIS — Q828 Other specified congenital malformations of skin: Secondary | ICD-10-CM

## 2013-05-23 DIAGNOSIS — E1149 Type 2 diabetes mellitus with other diabetic neurological complication: Secondary | ICD-10-CM

## 2013-05-23 DIAGNOSIS — L608 Other nail disorders: Secondary | ICD-10-CM

## 2013-05-23 DIAGNOSIS — M2010 Hallux valgus (acquired), unspecified foot: Secondary | ICD-10-CM

## 2013-05-23 NOTE — Progress Notes (Signed)
   Subjective:    Patient ID: Gregory Nunez, male    DOB: [birth year], 68 y.o.   MRN: [id number]  HPI ''CORN ON THE RT FOOT AND THE 2ND TOE.  AND TOENAILS TRIM.    Review of Systems no new changes or findings at this time.     Objective:   Physical Exam Masker status is intact with pedal pulses palpable patient is several months status post Austin bunionectomy left foot and hammertoe repair second digit left foot. There are no additional corns or calluses on the left at this time has resolved with surgical intervention. Patient these have thickening glucose incurvation of nails which are debrided at this time neurovascular status is intact with pedal pulses palpable DP postal for PT plus one over 4 bilateral skin color pigment and hair growth normal diminished hair growth noted epicritic and progressive sensations intact although diminished distally left more so than right patient's only complaint is he has numbness in his left foot more so right of the toe area no right in the leg mid leg and foot on the left are involved with numbness or loss of sensation patient cases been taking vitamin C my recommendation at this time is a B complex and folic acid vitamin D supplementation. 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       Assessment & Plan:  Assessment diabetes with peripheral neuropathy and some mild complications. Patient is notable bunion deformity residual on the right side which would be surgeries up in the future use to considering and we'll make arrangements for time at some point in the future we'll contact us for surgical consult. Likely Austin bunionectomy hammertoe repair second possibly tailor bunionectomy as well. At this time conservative care in the form of debridement of dystrophic friable nails carried out the  presence of diabetes and complications also debridement multiple keratoses right foot and dispensing to foam padding cushioning. On exam of his diabetic insoles the left tissue is fine however the right shoe insoles remove and in fact he is wearing a left orthotic or insert inside the right shoe this may correlate with some other difficulties patient is having sub-fifth MTP area patient is advised to go home and switch its appropriate orthotics labeled right foot not left again future followup for palliative care in 3 months for nail and callus care or contact us when he's ready for surgical intervention for bunion and hammertoe repair on the right foot. Should note have some slight hemorrhage a keratoses with no open were ulceration sub-fifth right at this time.  Harriet Masson DPM

## 2013-05-23 NOTE — Patient Instructions (Signed)
Diabetes and Foot Care Diabetes may cause you to have problems because of poor blood supply (circulation) to your feet and legs. This may cause the skin on your feet to become thinner, break easier, and heal more slowly. Your skin may become dry, and the skin may peel and crack. You may also have nerve damage in your legs and feet causing decreased feeling in them. You may not notice minor injuries to your feet that could lead to infections or more serious problems. Taking care of your feet is one of the most important things you can do for yourself.  HOME CARE INSTRUCTIONS  Wear shoes at all times, even in the house. Do not go barefoot. Bare feet are easily injured.  Check your feet daily for blisters, cuts, and redness. If you cannot see the bottom of your feet, use a mirror or ask someone for help.  Wash your feet with warm water (do not use hot water) and mild soap. Then pat your feet and the areas between your toes until they are completely dry. Do not soak your feet as this can dry your skin.  Apply a moisturizing lotion or petroleum jelly (that does not contain alcohol and is unscented) to the skin on your feet and to dry, brittle toenails. Do not apply lotion between your toes.  Trim your toenails straight across. Do not dig under them or around the cuticle. File the edges of your nails with an emery board or nail file.  Do not cut corns or calluses or try to remove them with medicine.  Wear clean socks or stockings every day. Make sure they are not too tight. Do not wear knee-high stockings since they may decrease blood flow to your legs.  Wear shoes that fit properly and have enough cushioning. To break in new shoes, wear them for just a few hours a day. This prevents you from injuring your feet. Always look in your shoes before you put them on to be sure there are no objects inside.  Do not cross your legs. This may decrease the blood flow to your feet.  If you find a minor scrape,  cut, or break in the skin on your feet, keep it and the skin around it clean and dry. These areas may be cleansed with mild soap and water. Do not cleanse the area with peroxide, alcohol, or iodine.  When you remove an adhesive bandage, be sure not to damage the skin around it.  If you have a wound, look at it several times a day to make sure it is healing.  Do not use heating pads or hot water bottles. They may burn your skin. If you have lost feeling in your feet or legs, you may not know it is happening until it is too late.  Make sure your health care provider performs a complete foot exam at least annually or more often if you have foot problems. Report any cuts, sores, or bruises to your health care provider immediately. SEEK MEDICAL CARE IF:   You have an injury that is not healing.  You have cuts or breaks in the skin.  You have an ingrown nail.  You notice redness on your legs or feet.  You feel burning or tingling in your legs or feet.  You have pain or cramps in your legs and feet.  Your legs or feet are numb.  Your feet always feel cold. SEEK IMMEDIATE MEDICAL CARE IF:   There is increasing redness,   swelling, or pain in or around a wound.  There is a red line that goes up your leg.  Pus is coming from a wound.  You develop a fever or as directed by your health care provider.  You notice a bad smell coming from an ulcer or wound. Document Released: 04/02/2000 Document Revised: 12/06/2012 Document Reviewed: 09/12/2012 Exit[employer] Patient Information 2014 [employer].  

## 2013-08-21 ENCOUNTER — Ambulatory Visit: Payer: Medicare Other

## 2013-12-18 HISTORY — PX: PROSTATE BIOPSY: SHX241

## 2013-12-19 DIAGNOSIS — C61 Malignant neoplasm of prostate: Secondary | ICD-10-CM

## 2013-12-19 HISTORY — DX: Malignant neoplasm of prostate: C61

## 2014-01-08 ENCOUNTER — Encounter: Payer: Self-pay | Admitting: Radiation Oncology

## 2014-01-08 NOTE — Progress Notes (Signed)
GU Location of Tumor / Histology: prostate adenocarcinoma  If Prostate Cancer, Gleason Score is (4 + 3) and PSA is (5.82)  Gregory Nunez presented 1 month ago with signs/symptoms of: elevated PSA  Biopsies of prostate (if applicable) revealed:  05/20/20  Volume 31.31 cc   Past/Anticipated interventions by urology, if any: biopsy  Past/Anticipated interventions by medical oncology, if any: no  Weight changes, if any: no  Bowel/Bladder complaints, if any:  IPSS 21,  Incomplete emptying, frequency, intermittent stream, urgency, weak stream, straining to urinate, nocturia x 2  Nausea/Vomiting, if any: no  Pain issues, if any:  no  SAFETY ISSUES:  Prior radiation? no  Pacemaker/ICD? no  Possible current pregnancy? na  Is the patient on methotrexate? no  Current Complaints / other details:  Patient getting more information before making decision. Dr Janice Norrie recommends radical prostatectomy. Patient retired from Web designer, worked for [employer]. Single, no children.

## 2014-01-09 ENCOUNTER — Ambulatory Visit
Admission: RE | Admit: 2014-01-09 | Discharge: 2014-01-09 | Disposition: A | Discharge status: Home / Self Care | Payer: Medicare Other | Source: Ambulatory Visit | Attending: Radiation Oncology | Admitting: Radiation Oncology

## 2014-01-09 ENCOUNTER — Encounter: Payer: Self-pay | Admitting: Radiation Oncology

## 2014-01-09 VITALS — BP 156/87 | HR 93 | Temp 97.9°F | Resp 20 | Ht 71.0 in | Wt 178.5 lb

## 2014-01-09 DIAGNOSIS — Z87898 Personal history of other specified conditions: Secondary | ICD-10-CM | POA: Diagnosis not present

## 2014-01-09 DIAGNOSIS — Z510 Encounter for antineoplastic radiation therapy: Secondary | ICD-10-CM | POA: Insufficient documentation

## 2014-01-09 DIAGNOSIS — F329 Major depressive disorder, single episode, unspecified: Secondary | ICD-10-CM | POA: Diagnosis not present

## 2014-01-09 DIAGNOSIS — F17200 Nicotine dependence, unspecified, uncomplicated: Secondary | ICD-10-CM | POA: Diagnosis not present

## 2014-01-09 DIAGNOSIS — F3289 Other specified depressive episodes: Secondary | ICD-10-CM | POA: Insufficient documentation

## 2014-01-09 DIAGNOSIS — N529 Male erectile dysfunction, unspecified: Secondary | ICD-10-CM | POA: Diagnosis not present

## 2014-01-09 DIAGNOSIS — R3911 Hesitancy of micturition: Secondary | ICD-10-CM | POA: Diagnosis not present

## 2014-01-09 DIAGNOSIS — C61 Malignant neoplasm of prostate: Secondary | ICD-10-CM | POA: Diagnosis not present

## 2014-01-09 DIAGNOSIS — Z7982 Long term (current) use of aspirin: Secondary | ICD-10-CM | POA: Insufficient documentation

## 2014-01-09 DIAGNOSIS — E119 Type 2 diabetes mellitus without complications: Secondary | ICD-10-CM | POA: Insufficient documentation

## 2014-01-09 HISTORY — DX: Malignant neoplasm of prostate: C61

## 2014-01-09 HISTORY — DX: Major depressive disorder, single episode, unspecified: F32.9

## 2014-01-09 HISTORY — DX: Type 2 diabetes mellitus without complications: E11.9

## 2014-01-09 HISTORY — DX: Essential (primary) hypertension: I10

## 2014-01-09 HISTORY — DX: Depression, unspecified: F32.A

## 2014-01-09 NOTE — Progress Notes (Signed)
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 male who is seen today through the courtesy of Dr. Lowella Nunez for discussion of possible radiation therapy in the management of his stageT1c intermediate risk adenocarcinoma prostate. He was referred by his primary care physician to Dr. Janice Nunez for evaluation of erectile dysfunction. Dr. Janice Nunez obtained a PSA on 11/15/2013 and this was slightly elevated at 5.82 with 14% free PSA. He underwent ultrasound-guided biopsies on 12/19/2013. His was found to have Gleason 7 (4+3) involving 5% of one core from left mid gland, 40% of one core from the left lateral apex and 40% of one core from left apex. He had Gleason 7 (3+4) involving 50% of one core from the left lateral base and 5% of one core from the right lateral apex. He had Gleason 6 (3+3) involving less than 5% of one core from the right lateral mid gland and 5% of one core from the right apex. His gland volume was 31.3 cc. He does have moderate urinary obstructive symptomatology with an I PSS score of 21. He has a fair amount of urinary hesitancy. No GI difficulties. He has erectile dysfunction.   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     PAST SURGICAL HISTORY:  Past Surgical History  Procedure Laterality Date  . Foot surgery    . Prostate biopsy  12/2013    Gleason 4+3=7, volume 31.31 cc     FAMILY HISTORY: family history  includes Cancer in his sister; Heart disease in his mother.  His father died at age 66, unknown cause. His mother is alive and well at age 48. No family history of prostate cancer.   SOCIAL HISTORY:  reports that he has been smoking Cigarettes.  He has a 40 pack-year smoking history. He does not have any smokeless tobacco history on file. He reports that he drinks alcohol. He reports that he does not use illicit drugs.  Divorced, no children. Retired from a Scientist, water quality.   ALLERGIES: Penicillins   MEDICATIONS:  Current Outpatient Prescriptions  Medication Sig Dispense Refill  . aspirin 81 MG tablet Take 81 mg by mouth daily.      Marland Kitchen buPROPion (WELLBUTRIN SR) 150 MG 12 hr tablet Take 150 mg by mouth 2 (two) times daily.      Marland Kitchen gabapentin (NEURONTIN) 300 MG capsule Take 300 mg by mouth 3 (three) times daily.      Marland Kitchen glimepiride (AMARYL) 4 MG tablet Take 4 mg by mouth daily with breakfast.       No current facility-administered medications for this encounter.     REVIEW OF SYSTEMS:  Pertinent items are noted in HPI.    PHYSICAL EXAM:  height is 5\' 11"  (1.803 m) and weight is 178 lb 8 oz (80.967 kg). His oral temperature is 97.9 F (36.6 C). His blood pressure is 156/87 and his pulse is 93. His respiration is 20.  Alert and oriented 68 year old African American male appearing younger than his stated age. Head and neck examination: Grossly unremarkable. Nodes: Without palpable cervical or supraclavicular lymphadenopathy. Chest: Lungs clear. Abdomen: Without masses organomegaly. Genitalia: Unremarkable to inspection. Rectal: Prostate gland is normal in size and is without focal induration or nodularity. Extremities: Without edema.   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 I explained to Gregory Nunez that his prognosis is related to his stage, PSA level, and Gleason score. His stage and PSA level are favorable while his Gleason score of 7 is of intermediate favorability. His management options include surgery versus radiation therapy. I do not recommend active surveillance in view of his Gleason 7 (4+3) disease and good performance status. Radiation therapy options would include 5 weeks of external beam followed by seed implant boost or 8 weeks of external beam/IMRT. We discussed the potential acute and late toxicities of radiation therapy. We also discussed treatment with a comfortably full bladder to minimize urinary toxicity. He is not a good candidate for seed implantation in view of his I PSS score of 21. However, this could improve with an alpha blocker. We also discussed the option of short-term (6 months) androgen deprivation therapy which is currently being studied in a national trial for patients with intermediate risk disease. Considering his  moderate volume Gleason score 7 (4+3) disease, think it would be reasonable to offer him this option recognizing the potential temporary side effects including hot flashes, loss of sex drive, and fatigue. I told him that robotic surgery and radiation therapy would be equally curative. I do recommend that he speak with one of the robotic surgeon's to discuss robotic surgery in more detail. He'll see Dr. Janice Nunez on September 29, and Dr. Janice Nunez can refer him to one of his robotic surgeons. After meeting a robotic surgeon the patient can contact me if you would like to proceed with external beam/IMRT. He understands that he will need to have placement of 3 gold seed markers for image guidance if he  wants to have radiation therapy. We can discuss short-term androgen deprivation therapy more detail at that time.   PLAN: As discussed above there  I spent 45  minutes face to face with the patient and more than 50% of that time was spent in counseling and/or coordination of care.

## 2014-01-09 NOTE — Progress Notes (Signed)
Please see the Nurse Progress Note in the MD Initial Consult Encounter for this patient. 

## 2014-01-18 ENCOUNTER — Encounter: Payer: Self-pay | Admitting: *Deleted

## 2014-01-18 NOTE — Progress Notes (Signed)
[facility] Psychosocial Distress Screening Clinical Social Work  Clinical Social Work was referred by distress screening protocol.  The patient scored a 7 on the Psychosocial Distress Thermometer which indicates moderate distress. Clinical Social Worker attempted to contact patient by phone to assess for distress and other psychosocial needs.   ONCBCN DISTRESS SCREENING 01/09/2014  Screening Type Initial Screening  Elta [place] the number that describes how much distress you have been experiencing in the past week 7  Emotional problem type Depression;Nervousness/Anxiety;Adjusting to illness  Spiritual/Religous concerns type Facing my mortality  Information Concerns Type Lack of info about treatment;Lack of info about complementary therapy choices;Lack of info about maintaining fitness  Physical Problem type Sleep/insomnia;Breathing;Sexual problems  Other "sexual problems" listed as most distressing    Clinical Social Worker follow up needed: Yes.    If yes, follow up plan:  Patient was not available.  CSW left message with patient's sister and requested patient return call when convenient.  Polo Riley, MSW, LCSW, OSW-C Clinical Social Worker [facility] [phone]

## 2014-02-05 ENCOUNTER — Other Ambulatory Visit: Payer: Self-pay | Admitting: Urology

## 2014-02-14 ENCOUNTER — Encounter: Payer: Self-pay | Admitting: *Deleted

## 2014-02-14 NOTE — Progress Notes (Signed)
[place] Work  Clinical Social Work was referred by patient for assessment of psychosocial needs due to need of transportation assistance.  Clinical Social Worker contacted patient at home to offer support and assess for needs. Pt reports he lives out in [place] which limits his transportation options. CSW discussed possible options with pt. He is currently staying at his mother's house in [place] in order to be closer to appointments, but plans to return home after his surgery. CSW discussed gas cards and ACS as options. Pt to consider his choices and CSW to research additional options as well and get back with pt.     Clinical Social Work interventions: Resource education  Loren Racer, [place] Worker [place]  [place] Phone: [phone] Fax: [phone]

## 2014-02-22 ENCOUNTER — Encounter (HOSPITAL_COMMUNITY)
Admission: RE | Admit: 2014-02-22 | Discharge: 2014-02-22 | Disposition: A | Discharge status: Home / Self Care | Payer: Medicare Other | Source: Ambulatory Visit | Attending: Urology | Admitting: Urology

## 2014-02-22 ENCOUNTER — Ambulatory Visit (HOSPITAL_COMMUNITY)
Admission: RE | Admit: 2014-02-22 | Discharge: 2014-02-22 | Disposition: A | Discharge status: Home / Self Care | Payer: Medicare Other | Source: Ambulatory Visit | Attending: Anesthesiology | Admitting: Anesthesiology

## 2014-02-22 ENCOUNTER — Encounter (HOSPITAL_COMMUNITY): Payer: Self-pay

## 2014-02-22 DIAGNOSIS — Z01818 Encounter for other preprocedural examination: Secondary | ICD-10-CM | POA: Insufficient documentation

## 2014-02-22 DIAGNOSIS — C61 Malignant neoplasm of prostate: Secondary | ICD-10-CM | POA: Diagnosis not present

## 2014-02-22 DIAGNOSIS — R0600 Dyspnea, unspecified: Secondary | ICD-10-CM

## 2014-02-22 DIAGNOSIS — E119 Type 2 diabetes mellitus without complications: Secondary | ICD-10-CM

## 2014-02-22 DIAGNOSIS — R05 Cough: Secondary | ICD-10-CM | POA: Diagnosis not present

## 2014-02-22 DIAGNOSIS — F17210 Nicotine dependence, cigarettes, uncomplicated: Secondary | ICD-10-CM | POA: Diagnosis not present

## 2014-02-22 DIAGNOSIS — R0602 Shortness of breath: Secondary | ICD-10-CM | POA: Insufficient documentation

## 2014-02-22 HISTORY — DX: Reserved for inherently not codable concepts without codable children: IMO0001

## 2014-02-22 HISTORY — DX: Gastro-esophageal reflux disease without esophagitis: K21.9

## 2014-02-22 LAB — COMPREHENSIVE METABOLIC PANEL
ALT: 80 U/L — AB (ref 0–53)
ANION GAP: 12 (ref 5–15)
AST: 78 U/L — ABNORMAL HIGH (ref 0–37)
Albumin: 3.4 g/dL — ABNORMAL LOW (ref 3.5–5.2)
Alkaline Phosphatase: 64 U/L (ref 39–117)
BUN: 13 mg/dL (ref 6–23)
CO2: 26 meq/L (ref 19–32)
Calcium: 9.6 mg/dL (ref 8.4–10.5)
Chloride: 103 mEq/L (ref 96–112)
Creatinine, Ser: 0.85 mg/dL (ref 0.50–1.35)
GFR calc Af Amer: >90 mL/min (ref >90)
GFR, EST NON AFRICAN AMERICAN: 89 mL/min — AB (ref >90)
Glucose, Bld: 225 mg/dL — ABNORMAL HIGH (ref 70–99)
Potassium: 3.9 mEq/L (ref 3.7–5.3)
SODIUM: 141 meq/L (ref 137–147)
Total Bilirubin: 0.3 mg/dL (ref 0.3–1.2)
Total Protein: 8.1 g/dL (ref 6.0–8.3)

## 2014-02-22 LAB — CBC
HCT: 39.9 % (ref 39.0–52.0)
HEMOGLOBIN: 13.5 g/dL (ref 13.0–17.0)
MCH: 31.2 pg (ref 26.0–34.0)
MCHC: 33.8 g/dL (ref 30.0–36.0)
MCV: 92.1 fL (ref 78.0–100.0)
Platelets: 152 10*3/uL (ref 150–400)
RBC: 4.33 MIL/uL (ref 4.22–5.81)
RDW: 11.5 % (ref 11.5–15.5)
WBC: 3.7 10*3/uL — ABNORMAL LOW (ref 4.0–10.5)

## 2014-02-22 NOTE — Progress Notes (Signed)
Your patient has screened at an elevated risk for Obstructive Sleep Apnea using the Stop-Bang Tool during a pre-surgical vist. A score of 4 or greater is an elevated risk. Score of 4. 

## 2014-02-22 NOTE — Patient Instructions (Addendum)
Gregory Nunez  02/22/2014   Your procedure is scheduled on:   Wednesday February 27, 2014  Report to [facility] Main Entrance and follow signs to  [place] arrive at [place].  Call this number if you have problems the morning of surgery [phone] or Presurgical Testing [phone].   Remember:  BEGIN CLEAR LIQUID DIET Tuesday November 10,2015 PER MD INSTRUCTION.  THEN DO NOT EAT ANY FOOD OR TAKE ANY LIQUIDS AFTER MIDNIGHT NIGHT PRIOR TO YOUR SURGERY.             Remember: follow any bowel prep instructions per MD office.      Take these medicines the morning of surgery with A SIP OF WATER: Bupropion;Gabapentin                               You may not have any metal on your body including hair pins and piercings  Do not wear jewelry, lotions, powders, or deodorant.             Men may shave face and neck.               Do not bring valuables to the hospital. [place].  Contacts, dentures or bridgework may not be worn into surgery.  Leave suitcase in the car. After surgery it may be brought to your room.  For patients admitted to the hospital, checkout time is 11:00 AM the day of discharge.   ________________________________________________________________________  [facility] - Preparing for Surgery Before surgery, you can play an important role.  Because skin is not sterile, your skin needs to be as free of germs as possible.  You can reduce the number of germs on your skin by washing with CHG (chlorahexidine gluconate) soap before surgery.  CHG is an antiseptic cleaner which kills germs and bonds with the skin to continue killing germs even after washing. Please DO NOT use if you have an allergy to CHG or antibacterial soaps.  If your skin becomes reddened/irritated stop using the CHG and inform your nurse when you arrive at Short Stay. Do not shave (including legs and underarms) for at least 48 hours prior to the first  CHG shower.  You may shave your face/neck. Please follow these instructions carefully:  1.  Shower with CHG Soap the night before surgery and the  morning of Surgery.  2.  If you choose to wash your hair, wash your hair first as usual with your  normal  shampoo.  3.  After you shampoo, rinse your hair and body thoroughly to remove the  shampoo.                           4.  Use CHG as you would any other liquid soap.  You can apply chg directly  to the skin and wash                       Gently with a scrungie or clean washcloth.  5.  Apply the CHG Soap to your body ONLY FROM THE NECK DOWN.   Do not use on face/ open                           Wound or open sores. Avoid contact with eyes, ears  mouth and genitals (private parts).                       Wash face,  Genitals (private parts) with your normal soap.             6.  Wash thoroughly, paying special attention to the area where your surgery  will be performed.  7.  Thoroughly rinse your body with warm water from the neck down.  8.  DO NOT shower/wash with your normal soap after using and rinsing off  the CHG Soap.                9.  Pat yourself dry with a clean towel.            10.  Wear clean pajamas.            11.  Place clean sheets on your bed the night of your first shower and do not  sleep with pets. Day of Surgery : Do not apply any lotions/deodorants the morning of surgery.  Please wear clean clothes to the hospital/surgery center.  FAILURE TO FOLLOW THESE INSTRUCTIONS MAY RESULT IN THE CANCELLATION OF YOUR SURGERY PATIENT SIGNATURE_________________________________  NURSE SIGNATURE__________________________________  _____   CLEAR LIQUID DIET   Foods Allowed                                                                     Foods Excluded  Coffee and tea, regular and decaf                             liquids that you cannot  Plain Jell-O in any flavor                                             see through such  as: Fruit ices (not with fruit pulp)                                     milk, soups, orange juice  Iced Popsicles                                    All solid food Carbonated beverages, regular and diet                                    Cranberry, grape and apple juices Sports drinks like Gatorade Lightly seasoned clear broth or consume(fat free) Sugar, honey syrup  Sample Menu Breakfast                                Lunch  Supper Cranberry juice                    Beef broth                            Chicken broth Jell-O                                     Grape juice                           Apple juice Coffee or tea                        Jell-O                                      Popsicle                                                Coffee or tea                        Coffee or tea  _____________________________________________________________________  ___________________________________________________________________

## 2014-02-22 NOTE — Progress Notes (Addendum)
CBC and CMP results per PAT visit 02/22/2014 in epic routed to Dr Clint Lipps / [facility]

## 2014-02-25 NOTE — Progress Notes (Signed)
Final EKG 02/22/2014 EPIC

## 2014-02-26 NOTE — Anesthesia Preprocedure Evaluation (Addendum)
Anesthesia Evaluation  Patient identified by MRN, date of birth, ID band Patient awake    Reviewed: Allergy & Precautions, H&P , NPO status , Patient's Chart, lab work & pertinent test results  Airway Mallampati: II  TM Distance: >3 FB Neck ROM: full    Dental  (+) Edentulous Upper, Edentulous Lower, Dental Advisory Given   Pulmonary neg pulmonary ROS, shortness of breath and with exertion, Current Smoker,  breath sounds clear to auscultation  Pulmonary exam normal       Cardiovascular Exercise Tolerance: Good hypertension, Rhythm:regular Rate:Normal     Neuro/Psych negative neurological ROS  negative psych ROS   GI/Hepatic negative GI ROS, Neg liver ROS, GERD-  ,pancreatitis   Endo/Other  negative endocrine ROSdiabetes, Well Controlled, Type 2, Oral Hypoglycemic Agents  Renal/GU negative Renal ROS  negative genitourinary   Musculoskeletal   Abdominal   Peds  Hematology negative hematology ROS (+)   Anesthesia Other Findings   Reproductive/Obstetrics negative OB ROS                            Anesthesia Physical Anesthesia Plan  ASA: III  Anesthesia Plan: General   Post-op Pain Management:    Induction: Intravenous  Airway Management Planned: Oral ETT  Additional Equipment:   Intra-op Plan:   Post-operative Plan: Extubation in OR  Informed Consent: I have reviewed the patients History and Physical, chart, labs and discussed the procedure including the risks, benefits and alternatives for the proposed anesthesia with the patient or authorized representative who has indicated his/her understanding and acceptance.   Dental Advisory Given  Plan Discussed with: CRNA and Surgeon  Anesthesia Plan Comments:         Anesthesia Quick Evaluation

## 2014-02-27 ENCOUNTER — Encounter (HOSPITAL_COMMUNITY)
Admission: RE | Disposition: A | Discharge status: Home / Self Care | Payer: Self-pay | Source: Ambulatory Visit | Attending: Urology

## 2014-02-27 ENCOUNTER — Encounter (HOSPITAL_COMMUNITY): Payer: Self-pay | Admitting: Certified Registered"

## 2014-02-27 ENCOUNTER — Inpatient Hospital Stay (HOSPITAL_COMMUNITY)
Admission: RE | Admit: 2014-02-27 | Discharge: 2014-03-01 | DRG: 708 | Disposition: A | Discharge status: Home / Self Care | Payer: Medicare Other | Source: Ambulatory Visit | Attending: Urology | Admitting: Urology

## 2014-02-27 ENCOUNTER — Inpatient Hospital Stay (HOSPITAL_COMMUNITY): Payer: Medicare Other | Admitting: Anesthesiology

## 2014-02-27 DIAGNOSIS — K219 Gastro-esophageal reflux disease without esophagitis: Secondary | ICD-10-CM | POA: Diagnosis present

## 2014-02-27 DIAGNOSIS — F17210 Nicotine dependence, cigarettes, uncomplicated: Secondary | ICD-10-CM | POA: Diagnosis present

## 2014-02-27 DIAGNOSIS — C61 Malignant neoplasm of prostate: Secondary | ICD-10-CM | POA: Diagnosis present

## 2014-02-27 DIAGNOSIS — F1290 Cannabis use, unspecified, uncomplicated: Secondary | ICD-10-CM | POA: Diagnosis present

## 2014-02-27 DIAGNOSIS — I10 Essential (primary) hypertension: Secondary | ICD-10-CM | POA: Diagnosis present

## 2014-02-27 DIAGNOSIS — N3289 Other specified disorders of bladder: Secondary | ICD-10-CM | POA: Diagnosis present

## 2014-02-27 DIAGNOSIS — Z809 Family history of malignant neoplasm, unspecified: Secondary | ICD-10-CM

## 2014-02-27 DIAGNOSIS — E119 Type 2 diabetes mellitus without complications: Secondary | ICD-10-CM | POA: Diagnosis present

## 2014-02-27 DIAGNOSIS — N529 Male erectile dysfunction, unspecified: Secondary | ICD-10-CM | POA: Diagnosis present

## 2014-02-27 HISTORY — PX: ROBOT ASSISTED LAPAROSCOPIC RADICAL PROSTATECTOMY: SHX5141

## 2014-02-27 HISTORY — PX: LYMPHADENECTOMY: SHX5960

## 2014-02-27 LAB — GLUCOSE, CAPILLARY
GLUCOSE-CAPILLARY: 251 mg/dL — AB (ref 70–99)
Glucose-Capillary: 106 mg/dL — ABNORMAL HIGH (ref 70–99)
Glucose-Capillary: 276 mg/dL — ABNORMAL HIGH (ref 70–99)
Glucose-Capillary: 222 mg/dL — ABNORMAL HIGH (ref 70–99)
Glucose-Capillary: 242 mg/dL — ABNORMAL HIGH (ref 70–99)

## 2014-02-27 LAB — HEMOGLOBIN AND HEMATOCRIT, BLOOD
HEMATOCRIT: 37.9 % — AB (ref 39.0–52.0)
Hemoglobin: 12.7 g/dL — ABNORMAL LOW (ref 13.0–17.0)

## 2014-02-27 SURGERY — ROBOTIC ASSISTED LAPAROSCOPIC RADICAL PROSTATECTOMY
Anesthesia: General

## 2014-02-27 MED ORDER — CIPROFLOXACIN IN D5W 400 MG/200ML IV SOLN
INTRAVENOUS | Status: AC
  Filled 2014-02-27: qty 200

## 2014-02-27 MED ORDER — LIDOCAINE HCL (PF) 2 % IJ SOLN
INTRAMUSCULAR | Status: DC | PRN
  Administered 2014-02-27: 30 mg via INTRADERMAL

## 2014-02-27 MED ORDER — HYDROMORPHONE HCL 1 MG/ML IJ SOLN
0.2500 mg | INTRAMUSCULAR | Status: DC | PRN
  Administered 2014-02-27: 0.25 mg via INTRAVENOUS
  Administered 2014-02-27: 0.5 mg via INTRAVENOUS

## 2014-02-27 MED ORDER — LACTATED RINGERS IR SOLN
Status: DC | PRN
  Administered 2014-02-27: 1000 mL

## 2014-02-27 MED ORDER — BUPROPION HCL ER (SR) 150 MG PO TB12
150.0000 mg | ORAL_TABLET | Freq: Every day | ORAL | Status: DC
  Administered 2014-02-27 – 2014-03-01 (×3): 150 mg via ORAL
  Filled 2014-02-27 (×3): qty 1

## 2014-02-27 MED ORDER — ONDANSETRON HCL 4 MG/2ML IJ SOLN
4.0000 mg | INTRAMUSCULAR | Status: DC | PRN
  Administered 2014-02-27: 4 mg via INTRAVENOUS
  Filled 2014-02-27: qty 2

## 2014-02-27 MED ORDER — LABETALOL HCL 5 MG/ML IV SOLN: 5.0000 mg | INTRAVENOUS | Status: DC | PRN

## 2014-02-27 MED ORDER — CLINDAMYCIN PHOSPHATE 900 MG/50ML IV SOLN
INTRAVENOUS | Status: AC
  Filled 2014-02-27: qty 50

## 2014-02-27 MED ORDER — PROPOFOL 10 MG/ML IV BOLUS
INTRAVENOUS | Status: DC | PRN
  Administered 2014-02-27: 150 mg via INTRAVENOUS

## 2014-02-27 MED ORDER — INSULIN ASPART 100 UNIT/ML ~~LOC~~ SOLN
0.0000 [IU] | Freq: Four times a day (QID) | SUBCUTANEOUS | Status: DC
  Administered 2014-02-27 (×2): 5 [IU] via SUBCUTANEOUS
  Administered 2014-02-27: 8 [IU] via SUBCUTANEOUS
  Administered 2014-02-28 (×2): 3 [IU] via SUBCUTANEOUS
  Administered 2014-02-28: 5 [IU] via SUBCUTANEOUS

## 2014-02-27 MED ORDER — DEXAMETHASONE SODIUM PHOSPHATE 10 MG/ML IJ SOLN
INTRAMUSCULAR | Status: DC | PRN
  Administered 2014-02-27: 10 mg via INTRAVENOUS

## 2014-02-27 MED ORDER — HYDROCODONE-ACETAMINOPHEN 5-325 MG PO TABS: 1.0000 | ORAL_TABLET | Freq: Four times a day (QID) | ORAL | Status: DC | PRN

## 2014-02-27 MED ORDER — FENTANYL CITRATE 0.05 MG/ML IJ SOLN
INTRAMUSCULAR | Status: AC
  Filled 2014-02-27: qty 5

## 2014-02-27 MED ORDER — SODIUM CHLORIDE 0.9 % IV BOLUS (SEPSIS)
1000.0000 mL | Freq: Once | INTRAVENOUS | Status: AC
  Administered 2014-02-27: 1000 mL via INTRAVENOUS

## 2014-02-27 MED ORDER — ACETAMINOPHEN 500 MG PO TABS
1000.0000 mg | ORAL_TABLET | Freq: Four times a day (QID) | ORAL | Status: AC
  Administered 2014-02-27 – 2014-02-28 (×4): 1000 mg via ORAL
  Filled 2014-02-27 (×7): qty 2

## 2014-02-27 MED ORDER — GLYCOPYRROLATE 0.2 MG/ML IJ SOLN
INTRAMUSCULAR | Status: AC
  Filled 2014-02-27: qty 3

## 2014-02-27 MED ORDER — DEXAMETHASONE SODIUM PHOSPHATE 10 MG/ML IJ SOLN
INTRAMUSCULAR | Status: AC
  Filled 2014-02-27: qty 1

## 2014-02-27 MED ORDER — CIPROFLOXACIN IN D5W 400 MG/200ML IV SOLN
400.0000 mg | Freq: Once | INTRAVENOUS | Status: AC
  Administered 2014-02-27: 400 mg via INTRAVENOUS

## 2014-02-27 MED ORDER — ONDANSETRON HCL 4 MG/2ML IJ SOLN
INTRAMUSCULAR | Status: AC
  Filled 2014-02-27: qty 2

## 2014-02-27 MED ORDER — CLINDAMYCIN PHOSPHATE 900 MG/50ML IV SOLN
900.0000 mg | Freq: Once | INTRAVENOUS | Status: AC
  Administered 2014-02-27: 900 mg via INTRAVENOUS

## 2014-02-27 MED ORDER — LACTATED RINGERS IV SOLN
INTRAVENOUS | Status: DC
Start: 2014-02-27 — End: 2014-02-27

## 2014-02-27 MED ORDER — HYDROMORPHONE HCL 1 MG/ML IJ SOLN
0.5000 mg | INTRAMUSCULAR | Status: DC | PRN
  Administered 2014-02-27 – 2014-02-28 (×5): 1 mg via INTRAVENOUS
  Filled 2014-02-27 (×5): qty 1

## 2014-02-27 MED ORDER — SODIUM CHLORIDE 0.9 % IJ SOLN
INTRAMUSCULAR | Status: DC | PRN
  Administered 2014-02-27: 20 mL via INTRAVENOUS

## 2014-02-27 MED ORDER — LACTATED RINGERS IV SOLN
INTRAVENOUS | Status: DC | PRN
  Administered 2014-02-27 (×2): via INTRAVENOUS

## 2014-02-27 MED ORDER — MIDAZOLAM HCL 2 MG/2ML IJ SOLN
INTRAMUSCULAR | Status: AC
  Filled 2014-02-27: qty 2

## 2014-02-27 MED ORDER — BELLADONNA ALKALOIDS-OPIUM 16.2-60 MG RE SUPP
1.0000 | Freq: Four times a day (QID) | RECTAL | Status: DC | PRN
  Administered 2014-02-27: 1 via RECTAL
  Filled 2014-02-27: qty 1

## 2014-02-27 MED ORDER — HYDROMORPHONE HCL 2 MG/ML IJ SOLN
INTRAMUSCULAR | Status: AC
  Filled 2014-02-27: qty 1

## 2014-02-27 MED ORDER — NEOSTIGMINE METHYLSULFATE 10 MG/10ML IV SOLN
INTRAVENOUS | Status: AC
  Filled 2014-02-27: qty 1

## 2014-02-27 MED ORDER — LIDOCAINE HCL (CARDIAC) 20 MG/ML IV SOLN
INTRAVENOUS | Status: AC
  Filled 2014-02-27: qty 5

## 2014-02-27 MED ORDER — SODIUM CHLORIDE 0.9 % IJ SOLN
INTRAMUSCULAR | Status: AC
  Filled 2014-02-27: qty 20

## 2014-02-27 MED ORDER — FENTANYL CITRATE 0.05 MG/ML IJ SOLN
INTRAMUSCULAR | Status: DC | PRN
  Administered 2014-02-27: 25 ug via INTRAVENOUS
  Administered 2014-02-27: 100 ug via INTRAVENOUS
  Administered 2014-02-27: 25 ug via INTRAVENOUS

## 2014-02-27 MED ORDER — PROPOFOL 10 MG/ML IV BOLUS
INTRAVENOUS | Status: AC
  Filled 2014-02-27: qty 20

## 2014-02-27 MED ORDER — SODIUM CHLORIDE 0.45 % IV SOLN
INTRAVENOUS | Status: DC
  Administered 2014-02-27 – 2014-02-28 (×2): via INTRAVENOUS

## 2014-02-27 MED ORDER — GLIMEPIRIDE 4 MG PO TABS
4.0000 mg | ORAL_TABLET | Freq: Every day | ORAL | Status: DC
  Administered 2014-02-28 – 2014-03-01 (×2): 4 mg via ORAL
  Filled 2014-02-27 (×3): qty 1

## 2014-02-27 MED ORDER — ROCURONIUM BROMIDE 100 MG/10ML IV SOLN
INTRAVENOUS | Status: DC | PRN
  Administered 2014-02-27: 50 mg via INTRAVENOUS
  Administered 2014-02-27: 10 mg via INTRAVENOUS

## 2014-02-27 MED ORDER — MIDAZOLAM HCL 2 MG/2ML IJ SOLN
1.0000 mg | INTRAMUSCULAR | Status: DC | PRN
  Administered 2014-02-27: 0.5 mg via INTRAVENOUS

## 2014-02-27 MED ORDER — CIPROFLOXACIN HCL 500 MG PO TABS: 500.0000 mg | ORAL_TABLET | Freq: Two times a day (BID) | ORAL | Status: DC

## 2014-02-27 MED ORDER — INSULIN ASPART 100 UNIT/ML ~~LOC~~ SOLN
SUBCUTANEOUS | Status: AC
  Filled 2014-02-27: qty 1

## 2014-02-27 MED ORDER — GLYCOPYRROLATE 0.2 MG/ML IJ SOLN
INTRAMUSCULAR | Status: DC | PRN
  Administered 2014-02-27: 0.2 mg via INTRAVENOUS
  Administered 2014-02-27: 0.6 mg via INTRAVENOUS

## 2014-02-27 MED ORDER — NEOSTIGMINE METHYLSULFATE 10 MG/10ML IV SOLN
INTRAVENOUS | Status: DC | PRN
  Administered 2014-02-27: 1 mg via INTRAVENOUS
  Administered 2014-02-27: 4 mg via INTRAVENOUS

## 2014-02-27 MED ORDER — OXYCODONE HCL 5 MG PO TABS
5.0000 mg | ORAL_TABLET | ORAL | Status: DC | PRN
  Administered 2014-02-28 (×2): 5 mg via ORAL
  Filled 2014-02-27 (×2): qty 1

## 2014-02-27 MED ORDER — GABAPENTIN 300 MG PO CAPS
300.0000 mg | ORAL_CAPSULE | Freq: Three times a day (TID) | ORAL | Status: DC
  Administered 2014-02-27 – 2014-03-01 (×6): 300 mg via ORAL
  Filled 2014-02-27 (×8): qty 1

## 2014-02-27 MED ORDER — BUPIVACAINE LIPOSOME 1.3 % IJ SUSP
20.0000 mL | Freq: Once | INTRAMUSCULAR | Status: DC
  Filled 2014-02-27: qty 20

## 2014-02-27 MED ORDER — LABETALOL HCL 5 MG/ML IV SOLN
INTRAVENOUS | Status: AC
Start: 2014-02-27 — End: 2014-02-27
  Administered 2014-02-27: 5 mg via INTRAVENOUS
  Filled 2014-02-27: qty 4

## 2014-02-27 MED ORDER — NICOTINE 21 MG/24HR TD PT24
21.0000 mg | MEDICATED_PATCH | Freq: Every morning | TRANSDERMAL | Status: DC
  Administered 2014-02-27 – 2014-02-28 (×2): 21 mg via TRANSDERMAL
  Filled 2014-02-27 (×3): qty 1

## 2014-02-27 MED ORDER — MIDAZOLAM HCL 5 MG/5ML IJ SOLN
INTRAMUSCULAR | Status: DC | PRN
  Administered 2014-02-27: 2 mg via INTRAVENOUS

## 2014-02-27 MED ORDER — ROCURONIUM BROMIDE 100 MG/10ML IV SOLN
INTRAVENOUS | Status: AC
  Filled 2014-02-27: qty 1

## 2014-02-27 MED ORDER — HYDROMORPHONE HCL 1 MG/ML IJ SOLN
INTRAMUSCULAR | Status: AC
  Filled 2014-02-27: qty 1

## 2014-02-27 MED ORDER — ONDANSETRON HCL 4 MG/2ML IJ SOLN
INTRAMUSCULAR | Status: DC | PRN
  Administered 2014-02-27: 4 mg via INTRAVENOUS

## 2014-02-27 MED ORDER — LACTATED RINGERS IV SOLN: INTRAVENOUS | Status: DC

## 2014-02-27 SURGICAL SUPPLY — 56 items
CABLE HIGH FREQUENCY MONO STRZ (ELECTRODE) ×4 IMPLANT
CANISTER SUCTION 2500CC (MISCELLANEOUS) ×2 IMPLANT
CANNULA SEAL DVNC (CANNULA) IMPLANT
CANNULA SEALS DA VINCI (CANNULA) ×2
CATH FOLEY 2WAY SLVR 18FR 30CC (CATHETERS) ×4 IMPLANT
CATH TIEMANN FOLEY 18FR 5CC (CATHETERS) ×4 IMPLANT
CHLORAPREP W/TINT 26ML (MISCELLANEOUS) ×4 IMPLANT
CLIP LIGATING HEM O LOK PURPLE (MISCELLANEOUS) ×14 IMPLANT
CLOTH BEACON ORANGE TIMEOUT ST (SAFETY) ×4 IMPLANT
CONT SPEC 4OZ CLIKSEAL STRL BL (MISCELLANEOUS) ×4 IMPLANT
COVER SURGICAL LIGHT HANDLE (MISCELLANEOUS) ×4 IMPLANT
COVER TIP SHEARS 8 DVNC (MISCELLANEOUS) ×2 IMPLANT
COVER TIP SHEARS 8MM DA VINCI (MISCELLANEOUS) ×2
CUTTER ECHEON FLEX ENDO 45 340 (ENDOMECHANICALS) ×4 IMPLANT
DECANTER SPIKE VIAL GLASS SM (MISCELLANEOUS) ×4 IMPLANT
DRSG TEGADERM 4X4.75 (GAUZE/BANDAGES/DRESSINGS) ×12 IMPLANT
DRSG TEGADERM 6X8 (GAUZE/BANDAGES/DRESSINGS) ×8 IMPLANT
ELECT REM PT RETURN 9FT ADLT (ELECTROSURGICAL) ×4
ELECTRODE REM PT RTRN 9FT ADLT (ELECTROSURGICAL) ×2 IMPLANT
GAUZE SPONGE 2X2 8PLY STRL LF (GAUZE/BANDAGES/DRESSINGS) ×2 IMPLANT
GLOVE BIO SURGEON STRL SZ 6.5 (GLOVE) ×3 IMPLANT
GLOVE BIO SURGEONS STRL SZ 6.5 (GLOVE) ×1
GLOVE BIOGEL M STRL SZ7.5 (GLOVE) ×8 IMPLANT
GLOVE BIOGEL PI IND STRL 7.5 (GLOVE) IMPLANT
GLOVE BIOGEL PI INDICATOR 7.5 (GLOVE)
GOWN STRL REUS W/TWL LRG LVL4 (GOWN DISPOSABLE) ×12 IMPLANT
HOLDER FOLEY CATH W/STRAP (MISCELLANEOUS) ×4 IMPLANT
IV LACTATED RINGERS 1000ML (IV SOLUTION) ×4 IMPLANT
KIT ACCESSORY DA VINCI DISP (KITS) ×2
KIT ACCESSORY DVNC DISP (KITS) IMPLANT
KIT PROCEDURE DA VINCI SI (MISCELLANEOUS) ×2
KIT PROCEDURE DVNC SI (MISCELLANEOUS) ×2 IMPLANT
LIQUID BAND (GAUZE/BANDAGES/DRESSINGS) ×4 IMPLANT
MARKER SKIN DUAL TIP RULER LAB (MISCELLANEOUS) ×2 IMPLANT
NDL INSUFFLATION 14GA 120MM (NEEDLE) ×2 IMPLANT
NEEDLE INSUFFLATION 14GA 120MM (NEEDLE) ×4 IMPLANT
NEEDLE SPNL 22GX7 SPINOC (NEEDLE) IMPLANT
PACK ROBOT UROLOGY CUSTOM (CUSTOM PROCEDURE TRAY) ×4 IMPLANT
RELOAD GREEN ECHELON 45 (STAPLE) ×4 IMPLANT
SET TUBE IRRIG SUCTION NO TIP (IRRIGATION / IRRIGATOR) ×4 IMPLANT
SOLUTION ELECTROLUBE (MISCELLANEOUS) ×4 IMPLANT
SPONGE GAUZE 2X2 STER 10/PKG (GAUZE/BANDAGES/DRESSINGS) ×2
SPONGE LAP 4X18 X RAY DECT (DISPOSABLE) ×4 IMPLANT
SUT ETHILON 3 0 PS 1 (SUTURE) ×4 IMPLANT
SUT MNCRL AB 4-0 PS2 18 (SUTURE) ×8 IMPLANT
SUT PDS AB 1 CT1 27 (SUTURE) ×8 IMPLANT
SUT VIC AB 2-0 SH 27 (SUTURE) ×4
SUT VIC AB 2-0 SH 27X BRD (SUTURE) ×2 IMPLANT
SUT VICRYL 0 UR6 27IN ABS (SUTURE) ×4 IMPLANT
SUT VLOC BARB 180 ABS3/0GR12 (SUTURE) ×12
SUTURE VLOC BRB 180 ABS3/0GR12 (SUTURE) ×6 IMPLANT
SYR 27GX1/2 1ML LL SAFETY (SYRINGE) ×4 IMPLANT
TOWEL OR 17X26 10 PK STRL BLUE (TOWEL DISPOSABLE) ×4 IMPLANT
TOWEL OR NON WOVEN STRL DISP B (DISPOSABLE) ×4 IMPLANT
TROCAR 12M 150ML BLUNT (TROCAR) ×4 IMPLANT
WATER STERILE IRR 1500ML POUR (IV SOLUTION) ×8 IMPLANT

## 2014-02-27 NOTE — Progress Notes (Signed)
Patient ID: Gregory Nunez, male   DOB: 10/14/1945, 68 y.o.   MRN: [id number] Post-op note  Subjective: The patient is doing well.  Having some pain and bladder spasms.  Objective: Vital signs in last 24 hours: Temp:  [96.1 F (35.6 C)-97.8 F (36.6 C)] 97.4 F (36.3 C) (11/11 1401) Pulse Rate:  [78-110] 93 (11/11 1401) Resp:  [12-25] 12 (11/11 1401) BP: (156-189)/(82-106) 185/99 mmHg (11/11 1401) SpO2:  [96 %-100 %] 100 % (11/11 1401) Weight:  [81.194 kg (179 lb)] 81.194 kg (179 lb) (11/11 0654)  Intake/Output from previous day:   Intake/Output this shift: Total I/O In: 2200 [I.V.:1200; IV Piggyback:1000] Out: 1460 [Urine:1400; Drains:60]  Physical Exam:  General: Alert and oriented. Abdomen: Soft, Nondistended. Incisions: Clean and dry. Urine: pink  Lab Results:  Recent Labs  02/27/14 1223  HGB 12.7*  HCT 37.9*    Assessment/Plan: POD#0   1) Continue to monitor  2) DVT prophy, clears, IS, amb, pain control  3) BP up but partially due to pain.  B/O for spasms.  Recheck BP after better pain control.    LOS: 0 days   Lequan Dobratz 02/27/2014, 3:38 PM

## 2014-02-27 NOTE — Transfer of Care (Signed)
Immediate Anesthesia Transfer of Care Note  Patient: Gregory Nunez  Procedure(s) Performed: Procedure(s): ROBOTIC ASSISTED LAPAROSCOPIC RADICAL PROSTATECTOMY WITH INDOCYANINE GREEN DYE (N/A) BILATERAL LYMPHADENECTOMY (Bilateral)  Patient Location: PACU  Anesthesia Type:General  Level of Consciousness: awake, alert  and oriented  Airway & Oxygen Therapy: Patient Spontanous Breathing, Patient connected to face mask oxygen and patient states can't breathe, 100% O2 sats, much reassurance offered.  Post-op Assessment: Report given to PACU RN, Post -op Vital signs reviewed and stable and Patient moving all extremities X 4  Post vital signs: Reviewed and stable  Complications: No apparent anesthesia complications

## 2014-02-27 NOTE — Discharge Instructions (Signed)

## 2014-02-27 NOTE — H&P (Signed)
SATOSHI KALAS is an 68 y.o. male.    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 Bilateral apex positivity as well as base. TRUS volume 30mL.  2 - Erectile Dysfunction - pt manages with Staxyn 100mg  for progressive ED.   PMH sig for DM (no neuropathy or sequelae), HTN. NO CV disese. NO prior chest or abdominal surgeries. NO blood thinners.   Today "Johnattan" is seen to proceed with robotic radical prostatectomy.  Past Medical History  Diagnosis Date  . Pancreatitis   . 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 Depression   . Hypertension   . Diabetes mellitus without complication   . Prostate cancer 12/19/13    Gleason 4+3=7, volume 31.31 cc  . Malignant neoplasm of prostate 01/09/2014  . Shortness of breath dyspnea     with exertion   . GERD (gastroesophageal reflux disease)     if drinks alcohol    Past Surgical History  Procedure Laterality Date  . Foot surgery    . Prostate biopsy  12/2013    Gleason 4+3=7, volume 31.31 cc  . Biopsy on throat      hx of     Family History  Problem Relation Age of Onset  . Heart disease Mother   . Cancer Sister     breast   Social History:  reports that he has been smoking Cigarettes.  He has a 20 pack-year smoking history. He has never used smokeless tobacco. He reports that he drinks alcohol. He reports that he uses illicit drugs (Marijuana and Cocaine).  Allergies:  Allergies  Allergen Reactions  . Penicillins Anxiety    No prescriptions prior to admission    No results found for this or any previous visit (from the past 48 hour(s)). No results found.  Review of Systems  Constitutional: Negative.   HENT: Negative.   Eyes: Negative.   Respiratory: Negative.   Cardiovascular: Negative.   Gastrointestinal: Negative.    Genitourinary: Negative.   Musculoskeletal: Negative.   Skin: Negative.   Neurological: Negative.   Endo/Heme/Allergies: Negative.   Psychiatric/Behavioral: Negative.     There were no vitals taken for this visit. Physical Exam  Constitutional: He appears well-developed.  HENT:  Head: Normocephalic.  Eyes: Pupils are equal, round, and reactive to light.  Neck: Normal range of motion.  Cardiovascular: Normal rate.   Respiratory: Effort normal.  GI: Soft.  Genitourinary: Penis normal.  No CVAT  Musculoskeletal: Normal range of motion.  Neurological: He is alert.  Skin: Skin is warm and dry.  Psychiatric: He has a normal mood and affect. His behavior is normal. Judgment normal.     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 I showed the patient on their abdomen the approximately 6 small incision (trocar) sites as well as presumed extraction sites with robotic approach as well as possible open incision sites should open conversion be necessary. We rediscussed peri-operative risks including bleeding, infection, deep vein thrombosis, pulmonary embolism, compartment syndrome, nuropathy / neuropraxia, heart attack, stroke, death, as well as long-term risks such as non-cure / need for additional therapy. We specifically readdressed that the procedure would compromise urinary  control leading to stress incontinence which typically resolves with time and pelvic rehabilitation (Kegel's, etc..), but can sometimes be permanent and require additional therapy including surgery. We also specifically readdressed sexual sequellae including significant erectile dysfunction which typically partially resolves with time but can also be permanent and require additional therapy including surgery.   We rediscussed the typical hospital course  including usual 1-2 night hospitalization, discharge with foley catheter in place usually for 1-2 weeks before voiding trial as well as usually 2 week recovery until able to perform most non-strenuous activity and 6 weeks until able to return to most jobs and more strenuous activity such as exercise.   He voiced understanding and wants to proceed today as planned.    2 - Erectile Dysfunction - frankly discussed with patient that this would be worse post-op an likely require more aggressive therapy such as injections or other to achieve response adequate for intercourse. He has very good understanding of this and cancer control remains his top priority.  Lily Velasquez 02/27/2014, 6:03 AM

## 2014-02-27 NOTE — Brief Op Note (Signed)
02/27/2014  11:40 AM  PATIENT:  Gregory Nunez  68 y.o. male  PRE-OPERATIVE DIAGNOSIS:  PROSTATE CANCER  POST-OPERATIVE DIAGNOSIS:  PROSTATE CANCER  PROCEDURE:  Procedure(s): ROBOTIC ASSISTED LAPAROSCOPIC RADICAL PROSTATECTOMY WITH INDOCYANINE GREEN DYE (N/A) BILATERAL LYMPHADENECTOMY (Bilateral)  SURGEON:  Surgeon(s) and Role:    * Alexis Frock, MD - Primary  PHYSICIAN ASSISTANT:   ASSISTANTS: Debbrah Alar, PA   ANESTHESIA:   local and general  EBL:  Total I/O In: 1000 [I.V.:1000] Out: -   BLOOD ADMINISTERED:none  DRAINS: JP to bulb, Foley to gravity   LOCAL MEDICATIONS USED:  MARCAINE     SPECIMEN:  Source of Specimen:  pelvic lymph nodes, prostatectomy, rt prostatic pedicle (frozen)  DISPOSITION OF SPECIMEN:  PATHOLOGY  COUNTS:  YES  TOURNIQUET:  * No tourniquets in log *  DICTATION: .Other Dictation: Dictation Number [id number]  PLAN OF CARE: Admit to inpatient   PATIENT DISPOSITION:  PACU - hemodynamically stable.   Delay start of Pharmacological VTE agent (>24hrs) due to surgical blood loss or risk of bleeding: yes

## 2014-02-27 NOTE — Plan of Care (Signed)
Problem: Consults Goal: Radical Robotic Prostatectomy Patient Education Outcome: Completed/Met Date Met:  02/27/14  Problem: Phase I Progression Outcomes Goal: Pain controlled with appropriate interventions Outcome: Completed/Met Date Met:  02/27/14 Goal: Foley/JP patent Outcome: Completed/Met Date Met:  02/27/14 Goal: Incision/dressing intact Outcome: Completed/Met Date Met:  02/27/14

## 2014-02-27 NOTE — Anesthesia Postprocedure Evaluation (Signed)
  Anesthesia Post-op Note  Patient: Gregory Nunez  Procedure(s) Performed: Procedure(s) (LRB): ROBOTIC ASSISTED LAPAROSCOPIC RADICAL PROSTATECTOMY WITH INDOCYANINE GREEN DYE (N/A) BILATERAL LYMPHADENECTOMY (Bilateral)  Patient Location: PACU  Anesthesia Type: General  Level of Consciousness: awake and alert   Airway and Oxygen Therapy: Patient Spontanous Breathing  Post-op Pain: mild  Post-op Assessment: Post-op Vital signs reviewed, Patient's Cardiovascular Status Stable, Respiratory Function Stable, Patent Airway and No signs of Nausea or vomiting  Last Vitals:  Filed Vitals:   02/27/14 1300  BP: 188/95  Pulse: 84  Temp: 36.3 C  Resp: 17    Post-op Vital Signs: stable   Complications: No apparent anesthesia complications

## 2014-02-28 ENCOUNTER — Encounter (HOSPITAL_COMMUNITY): Payer: Self-pay | Admitting: Urology

## 2014-02-28 LAB — CREATININE, FLUID (PLEURAL, PERITONEAL, JP DRAINAGE): Creat, Fluid: 1.3 mg/dL

## 2014-02-28 LAB — GLUCOSE, CAPILLARY
GLUCOSE-CAPILLARY: 166 mg/dL — AB (ref 70–99)
GLUCOSE-CAPILLARY: 155 mg/dL — AB (ref 70–99)
GLUCOSE-CAPILLARY: 154 mg/dL — AB (ref 70–99)
Glucose-Capillary: 219 mg/dL — ABNORMAL HIGH (ref 70–99)
Glucose-Capillary: 192 mg/dL — ABNORMAL HIGH (ref 70–99)

## 2014-02-28 LAB — BASIC METABOLIC PANEL
ANION GAP: 12 (ref 5–15)
BUN: 20 mg/dL (ref 6–23)
CALCIUM: 8.1 mg/dL — AB (ref 8.4–10.5)
CO2: 22 mEq/L (ref 19–32)
CREATININE: 1.22 mg/dL (ref 0.50–1.35)
Chloride: 96 mEq/L (ref 96–112)
GFR, EST AFRICAN AMERICAN: 70 mL/min — AB (ref >90)
GFR, EST NON AFRICAN AMERICAN: 60 mL/min — AB (ref >90)
Glucose, Bld: 205 mg/dL — ABNORMAL HIGH (ref 70–99)
Potassium: 4.5 mEq/L (ref 3.7–5.3)
Sodium: 130 mEq/L — ABNORMAL LOW (ref 137–147)

## 2014-02-28 LAB — HEMOGLOBIN AND HEMATOCRIT, BLOOD
HEMATOCRIT: 32.4 % — AB (ref 39.0–52.0)
HEMOGLOBIN: 11.1 g/dL — AB (ref 13.0–17.0)

## 2014-02-28 MED ORDER — INSULIN ASPART 100 UNIT/ML ~~LOC~~ SOLN: 0.0000 [IU] | Freq: Three times a day (TID) | SUBCUTANEOUS | Status: DC

## 2014-02-28 NOTE — Plan of Care (Signed)
Problem: Phase II Progression Outcomes Goal: Pain controlled Outcome: Completed/Met Date Met:  02/28/14 Goal: Ambulate in halls 4-6 x day Outcome: Completed/Met Date Met:  02/28/14 Goal: Discharge plan established Outcome: Completed/Met Date Met:  02/28/14 Goal: Tolerates clear liquids POD #1 Outcome: Completed/Met Date Met:  02/28/14 Goal: Vital signs stable Outcome: Completed/Met Date Met:  02/28/14 Goal: Other Phase II Outcomes/Goals Outcome: Not Applicable Date Met:  84/83/50

## 2014-02-28 NOTE — Op Note (Signed)
NAME:  Gregory Nunez, Gregory Nunez NO.:  [id number]  MEDICAL RECORD NO.:  [id number]  LOCATION:  4259                         FACILITY:  [facility]  PHYSICIAN:  Alexis Frock, MD     DATE OF BIRTH:  1945/09/12  DATE OF PROCEDURE: 02/27/2014 DATE OF DISCHARGE:                              OPERATIVE REPORT   PREOPERATIVE DIAGNOSIS:  Moderate risk prostate cancer.  PROCEDURE: 1. Robotic-assisted laparoscopic radical prostatectomy. 2. Laparoscopic bilateral pelvic lymphadenectomy. 3. Injection of indocyanine green dye for sentinel lymphangiography.  ESTIMATED BLOOD LOSS:  Nil.  COMPLICATIONS:  None.  SPECIMEN: 1. Periprostatic fat. 2. Right external iliac lymph nodes. 3. Right obturator lymph nodes. 4. Left external iliac lymph nodes. 5. Left obturator lymph nodes. 6. Right prostatic pedicle tissue for frozen section, negative for     carcinoma. 7. Radical prostatectomy. 8. Periprostatic lymph node, sentinal  ASSISTANT:  Clemetine Marker, PA.  DRAINS: 1. Foley catheter straight drain. 2. Jackson-Pratt drain bulb suction.  INDICATION:  Gregory Nunez is a very pleasant 68 year old gentleman who was found on workup of elevated PSA blood test to have moderate risk prostate cancer with multifocal Gleason 7 as well as Gleason 6 disease including bilateral apical involvement.  Options were discussed for definitive management including ablated therapies versus surgery versus surveillance protocols, and he adamantly wished to proceed with surgery with minimal invasive assistance.  We discussed the implications of apical biopsy positivity and had agreed that a non-nerve sparing approach would be most prudent oncologically and he wished to proceed. Informed consent was obtained and placed in medical record.  PROCEDURE IN DETAIL:  The patient being Gregory Nunez, procedure being robotic radical prostatectomy was confirmed.  Procedure was carried out. Time-out was performed.   Intravenous antibiotics administered.  General endotracheal anesthesia was introduced.  The patient was placed into a low lithotomy position.  He was further fashioned on the operative table using 3-inch tape over foam padding across his chest.  Sterile field was created by first proper shaving and then prepping his infraxiphoid abdomen using  chlorhexidine and gluconate in his penis, perineum, proximal thighs using iodine x3.  He was placed in a steep Trendelenburg position and was found to be in adequate position.  Foley catheter was placed per urethra to straight drain.  A high-flow low pressure pneumoperitoneum was obtained using Veress technique in the infraumbilical midline having passed the aspiration and drop test. Next, a 12-mm robotic camera port was placed in the same location. Laparoscopic examination of peritoneal cavity revealed no significant adhesions, no visceral injury.  Additional ports were placed as follows; right paramedian 8-mm robotic port, right far lateral 12-mm assist port, right paramedian 5-mm suction port, left paramedian 8-mm robotic port, left far lateral 8-mm robotic port.  Robot was docked and passed through electronic checks.  Initial attention was directed at development of space of Retzius.  Incision was made lateral to the right medial umbilical ligament from the midline towards the area of the internal ring and coursing along the iliac vessels towards the area of the ureter, which was positively identified.  The lateral bladder was then swept away from the right pelvic sidewall towards the area of the endopelvic  fascia.  A mirror image dissection was performed on the left side sweeping the left lateral bladder away from the pelvic sidewall. Bilateral vas deferens were encountered during these maneuvers and ligated and use of the bucket handle for bladder mobilization.  Anterior attachments were taken down using cautery scissors.  This exposed  the anterior base of the prostate, which was then defatted.  The tissue set aside for permanent pathology, labeled periprostatic fat.  Next, a robotically guided percutaneously placed 18-gauge spinal needle was used to inject 0.2 mL of indocyanine green dye into each lobe of the prostate with intervening section and coagulation current injection sites to avoid dye spillage, which did not occur.  The endopelvic fascia was then carefully incised bilaterally and the lateral prostatic plane developed towards the apex.  This exposed the dorsal venous complex, which was controlled using vascular load stapler, resulted in excellent hemostatic control of the stricture and no evidence of membranous urethral injury.  Attention was then directed at lymph node dissection and lymphangiography.  Under near infrared fluorescence light, the entire pelvis was carefully inspected.  There was some hyperfluorescence tissue coursing along the periprostatic fat in the bladder neck area, however, there was no obvious sentinel nodes in the additional distributions of the iliac or obturator groups bilaterally, inspecting all the way to the bifurcation. As such, decision was made to proceed with standard template dissection of lymph nodes, first on the right side with the confines of the external iliac packet being the external iliac artery and pelvic side wall, external iliac vein, and ureter.  This fibrofatty tissue was set aside, labeled right external iliac lymph nodes.  Similarly, the right obturator group was dissected, mobilized, and lymphostasis was achieved with cold clips with confines being the obturator nerve, pelvic side wall and external iliac vein.  A mirror image lymphadenectomy was performed on the left side  and packets again being the left external iliac, left obturator lymph nodes.  Bilateral obturator nerve were inspected following these maneuvers and found to be uninjured.  Additional  hyperfluorescent tissue from the area of the bladder neck on the left side corresponding to likely a sentinel drainage was mobilized.  Lymphostasis was achieved with cold clips and set aside labeled periprostatic lymph node sentinel.  The bladder neck was then identified by moving the Foley catheter back and forth. Dissection proceeded in the anterior to posterior direction separating the bladder neck from the base of the prostate taking great care to avoid excessive caliber of bladder neck.  Posterior dissection was performed by incising approximately 7 mm inferior posterior to the posterior lip of the bladder and dissecting directly inferiorly.  The plane of Denonvilliers was entered.  Bilateral seminal vesicles and vas deferens were encountered.  The bilateral vas deferens were dissected for a distance of 4 cm, ligated, and placed on gentle superior traction. Bilateral seminal vesicles were dissected towards the tips and also placed on gentle superior traction.  Pressure dissection was then continued again within the plane of the Denonvilliers towards the apex of the prostate.  This exposed the prostatic pedicles on both sides. The right prostatic pedicle was quite thick and very dense and this was controlled using sequential clipping technique.  This was somewhat concerning for possible locally advanced disease.  Frozen section was sent of the tissue and found to be negative for carcinoma.  Towards the area of the apex, only partial nerve sparing was performed.  Again on the left side, the prostatic pedicle was controlled  using sequential clipping technique.  Towards the area of the apex, the left side was much less adherent, and therefore moderate nerve sparing was performed on the left side only.  Next, membranous urethra was cut purposely in the anterior plane by placing the prostate in gentle superior traction.  This completely freed up the prostatectomy specimen, which was  placed into an EndoCatch bag for later retrieval.  Digital rectal exam was performed by surgeon using Indicator glove and no evidence of rectal violation was seen.  Next, posterior dissection was performed using a 3-0 V-loc suture reapproximating the posterior urethral plate to the posterior bladder tissue bringing any structures into tension-free apposition.  Next, mucosal anastomosis was performed using double-armed V-Loc suture at 6 o'clock to the 12 o'clock positions at 2 separate running suture lines. A new Foley catheter was then placed and irrigated quantitatively, and no leak was seen.  A closed suction drain was brought through the previous left lateral most robotic port site.  Right prior 12 mm assistant port site was closed at the level of fascia using 0-Vicryl and a Carter-Thomason suture passer.  Robot was then undocked.  All sponge and needle counts were correct.  Specimen was retrieved by extending the previous camera port site for total distance of approximately 3 cm removing the prostatectomy specimen and setting aside for permanent pathology.  The extraction site was closed at the level of fascia using figure-of-eight PDS followed by Scarpa's with Vicryl.  All incision sites were infiltrated with dilute lipolyzed Marcaine and closed at the level of the skin using subcuticular Monocryl followed by Dermabond. Procedure was then terminated.  The patient tolerated the procedure well.  There were no immediate periprocedural complications.  The patient was taken to [facility] in stable condition.          ______________________________ Alexis Frock, MD     TM/MEDQ  D:  02/27/2014  T:  02/28/2014  Job:  [id number]

## 2014-02-28 NOTE — Progress Notes (Signed)
1 Day Post-Op  Subjective:  1 - Moderate Risk Prostate Cancer - s/p robotic prostatectomy with bilateral template + ICG sentinel lymphadenectomy 02/27/2014.  Today Gregory Nunez is w/o complaints. Tollerating diet, ambulating, pain manageable.   Objective: Vital signs in last 24 hours: Temp:  [97.5 F (36.4 C)-98.5 F (36.9 C)] 98.5 F (36.9 C) (11/12 1439) Pulse Rate:  [90-106] 98 (11/12 1439) Resp:  [18-19] 19 (11/12 1439) BP: (111-156)/(67-91) 132/76 mmHg (11/12 1439) SpO2:  [97 %-100 %] 97 % (11/12 1439) Last BM Date: 02/26/14  Intake/Output from previous day: 11/11 0701 - 11/12 0700 In: 4595 [P.O.:480; I.V.:3115; IV Piggyback:1000] Out: 3125 [Urine:2850; Drains:275] Intake/Output this shift: Total I/O In: 480 [P.O.:480] Out: 630 [Urine:500; Drains:130]  General appearance: alert, cooperative and appears stated age Eyes: conjunctivae/corneas clear. PERRL, EOM's intact. Fundi benign. Nose: Nares normal. Septum midline. Mucosa normal. No drainage or sinus tenderness. Throat: lips, mucosa, and tongue normal; teeth and gums normal Neck: supple, symmetrical, trachea midline Back: symmetric, no curvature. ROM normal. No CVA tenderness. Resp: Non-labored on room air Cardio: Nl rate GI: soft, non-tender; bowel sounds normal; no masses,  no organomegaly Male genitalia: normal, Foley c/d/i with clearing urine. No clots Extremities: extremities normal, atraumatic, no cyanosis or edema Pulses: 2+ and symmetric Skin: Skin color, texture, turgor normal. No rashes or lesions Lymph nodes: Cervical, supraclavicular, and axillary nodes normal. Neurologic: Grossly normal Incision/Wound:  Lab Results:   Recent Labs  02/27/14 1223 02/28/14 0403  HGB 12.7* 11.1*  HCT 37.9* 32.4*   BMET  Recent Labs  02/28/14 0403  NA 130*  K 4.5  CL 96  CO2 22  GLUCOSE 205*  BUN 20  CREATININE 1.22  CALCIUM 8.1*   PT/INR No results for input(s): LABPROT, INR in the last 72 hours. ABG No  results for input(s): PHART, HCO3 in the last 72 hours.  Invalid input(s): PCO2, PO2  Studies/Results: No results found.  Anti-infectives: Anti-infectives    Start     Dose/Rate Route Frequency Ordered Stop   02/27/14 0900  ciprofloxacin (CIPRO) IVPB 400 mg     400 mg200 mL/hr over 60 Minutes Intravenous  Once 02/27/14 0856 02/27/14 0935   02/27/14 0900  clindamycin (CLEOCIN) IVPB 900 mg     900 mg100 mL/hr over 30 Minutes Intravenous  Once 02/27/14 0858 02/27/14 0910   02/27/14 0000  ciprofloxacin (CIPRO) 500 MG tablet     500 mg Oral 2 times daily 02/27/14 1145        Assessment/Plan:  1 - Moderate Risk Prostate Cancer - doing very well POD1. JP out today as Cr same as serum. He has minimal support at home, therefore plan on speng another night and likely DC home in AM tomorrow.  [facility], Orlandus Borowski 02/28/2014

## 2014-02-28 NOTE — Progress Notes (Signed)
Writer educated patient on leg bag teaching.  Patient verbalizes and demonstrates understanding.  Leg bag is now connected to foley.

## 2014-03-01 LAB — GLUCOSE, CAPILLARY: GLUCOSE-CAPILLARY: 112 mg/dL — AB (ref 70–99)

## 2014-03-01 MED ORDER — SENNOSIDES-DOCUSATE SODIUM 8.6-50 MG PO TABS: 1.0000 | ORAL_TABLET | Freq: Two times a day (BID) | ORAL | Status: DC

## 2014-03-01 NOTE — Discharge Summary (Signed)
Physician Discharge Summary  Patient ID: Gregory Nunez MRN: [id number] DOB/AGE: October 18, 1945 68 y.o.  Admit date: 02/27/2014 Discharge date: 03/01/2014  Admission Diagnoses: Prostate Cancer  Discharge Diagnoses:  Active Problems:   Prostate cancer   Discharged Condition: good  Hospital Course:   1 - Moderate Risk Prostate Cancer - s/p robotic prostatectomy with bilateral template + ICG sentinel lymphadenectomy 02/27/2014. JP drain removed POD 1 as output minimal and Cr same as serum. By POD2, the day of discharge, he is ambulatory, tollerating diet, pain controlled on PO meds, and felt to be adequate for discharge.  Consults: None  Significant Diagnostic Studies: labs: patholgy (pending at discharge)  Treatments: surgery: robotic prostatectomy with bilateral template + ICG sentinel lymphadenectomy 02/27/2014  Discharge Exam: Blood pressure 145/89, pulse 112, temperature 98.7 F (37.1 C), temperature source Oral, resp. rate 18, height 5\' 11"  (1.803 m), weight 81.194 kg (179 lb), SpO2 98 %. General appearance: alert, cooperative, appears stated age and very vigorous Eyes: negative Nose: Nares normal. Septum midline. Mucosa normal. No drainage or sinus tenderness. Throat: lips, mucosa, and tongue normal; teeth and gums normal Neck: supple, symmetrical, trachea midline Back: symmetric, no curvature. ROM normal. No CVA tenderness. Resp: non-labored on room air Cardio: Nl rate GI: soft, non-tender; bowel sounds normal; no masses,  no organomegaly Male genitalia: normal, foley c/d/i with yellow urine Extremities: extremities normal, atraumatic, no cyanosis or edema Pulses: 2+ and symmetric Skin: Skin color, texture, turgor normal. No rashes or lesions Lymph nodes: Cervical, supraclavicular, and axillary nodes normal. Neurologic: Grossly normal Incision/Wound: recent incision sites c/d/i. Prior JP site c/d/i with dressing in place. No hernias.   Disposition: 01-Home or Self  Care     Medication List    STOP taking these medications        aspirin 81 MG tablet      TAKE these medications        buPROPion 150 MG 12 hr tablet  Commonly known as:  WELLBUTRIN SR  Take 150 mg by mouth daily.     ciprofloxacin 500 MG tablet  Commonly known as:  CIPRO  Take 1 tablet (500 mg total) by mouth 2 (two) times daily. Start day prior to office visit for foley removal     gabapentin 300 MG capsule  Commonly known as:  NEURONTIN  Take 300 mg by mouth 3 (three) times daily.     glimepiride 4 MG tablet  Commonly known as:  AMARYL  Take 4 mg by mouth daily with breakfast.     HYDROcodone-acetaminophen 5-325 MG per tablet  Commonly known as:  NORCO  Take 1-2 tablets by mouth every 6 (six) hours as needed.     nicotine 21 mg/24hr patch  Commonly known as:  NICODERM CQ - dosed in mg/24 hours  Place 21 mg onto the skin every morning.     OVER THE COUNTER MEDICATION  Place 2 drops into both eyes daily as needed. muro     senna-docusate 8.6-50 MG per tablet  Commonly known as:  Senokot-S  Take 1 tablet by mouth 2 (two) times daily. While taking pain meds to prevent constipation           Follow-up Information    Follow up with Alexis Frock, MD On 03/07/2014.   Specialty:  Urology   Why:  at 10:45   Contact information:   [address] [place] [address] [phone]       Signed: Alexis Frock 03/01/2014, 6:44 AM

## 2014-03-06 ENCOUNTER — Emergency Department (HOSPITAL_COMMUNITY)
Admission: EM | Admit: 2014-03-06 | Discharge: 2014-03-07 | Disposition: A | Discharge status: Home / Self Care | Payer: Medicare Other | Source: Home / Self Care | Attending: Emergency Medicine | Admitting: Emergency Medicine

## 2014-03-06 ENCOUNTER — Encounter (HOSPITAL_COMMUNITY): Payer: Self-pay | Admitting: Family Medicine

## 2014-03-06 ENCOUNTER — Encounter (HOSPITAL_COMMUNITY): Payer: Self-pay | Admitting: Emergency Medicine

## 2014-03-06 ENCOUNTER — Emergency Department (HOSPITAL_COMMUNITY)
Admission: EM | Admit: 2014-03-06 | Discharge: 2014-03-06 | Disposition: A | Discharge status: Home / Self Care | Payer: Medicare Other | Attending: Emergency Medicine | Admitting: Emergency Medicine

## 2014-03-06 DIAGNOSIS — Z8719 Personal history of other diseases of the digestive system: Secondary | ICD-10-CM | POA: Insufficient documentation

## 2014-03-06 DIAGNOSIS — N4889 Other specified disorders of penis: Secondary | ICD-10-CM

## 2014-03-06 DIAGNOSIS — Z8739 Personal history of other diseases of the musculoskeletal system and connective tissue: Secondary | ICD-10-CM

## 2014-03-06 DIAGNOSIS — I10 Essential (primary) hypertension: Secondary | ICD-10-CM

## 2014-03-06 DIAGNOSIS — Z79899 Other long term (current) drug therapy: Secondary | ICD-10-CM | POA: Insufficient documentation

## 2014-03-06 DIAGNOSIS — T83098A Other mechanical complication of other indwelling urethral catheter, initial encounter: Secondary | ICD-10-CM

## 2014-03-06 DIAGNOSIS — R1030 Lower abdominal pain, unspecified: Secondary | ICD-10-CM | POA: Diagnosis not present

## 2014-03-06 DIAGNOSIS — Z8546 Personal history of malignant neoplasm of prostate: Secondary | ICD-10-CM | POA: Insufficient documentation

## 2014-03-06 DIAGNOSIS — T83091A Other mechanical complication of indwelling urethral catheter, initial encounter: Secondary | ICD-10-CM

## 2014-03-06 DIAGNOSIS — Z792 Long term (current) use of antibiotics: Secondary | ICD-10-CM | POA: Insufficient documentation

## 2014-03-06 DIAGNOSIS — E119 Type 2 diabetes mellitus without complications: Secondary | ICD-10-CM

## 2014-03-06 DIAGNOSIS — Z880 Allergy status to penicillin: Secondary | ICD-10-CM

## 2014-03-06 DIAGNOSIS — K5900 Constipation, unspecified: Secondary | ICD-10-CM | POA: Diagnosis not present

## 2014-03-06 DIAGNOSIS — F329 Major depressive disorder, single episode, unspecified: Secondary | ICD-10-CM | POA: Insufficient documentation

## 2014-03-06 DIAGNOSIS — Z720 Tobacco use: Secondary | ICD-10-CM | POA: Insufficient documentation

## 2014-03-06 DIAGNOSIS — Y846 Urinary catheterization as the cause of abnormal reaction of the patient, or of later complication, without mention of misadventure at the time of the procedure: Secondary | ICD-10-CM | POA: Insufficient documentation

## 2014-03-06 DIAGNOSIS — K6289 Other specified diseases of anus and rectum: Secondary | ICD-10-CM | POA: Diagnosis not present

## 2014-03-06 DIAGNOSIS — Z7982 Long term (current) use of aspirin: Secondary | ICD-10-CM

## 2014-03-06 LAB — I-STAT CHEM 8, ED
BUN: 11 mg/dL (ref 6–23)
CALCIUM ION: 1.21 mmol/L (ref 1.13–1.30)
Chloride: 101 mEq/L (ref 96–112)
Creatinine, Ser: 0.8 mg/dL (ref 0.50–1.35)
Glucose, Bld: 146 mg/dL — ABNORMAL HIGH (ref 70–99)
HEMATOCRIT: 34 % — AB (ref 39.0–52.0)
HEMOGLOBIN: 11.6 g/dL — AB (ref 13.0–17.0)
Potassium: 4.2 mEq/L (ref 3.7–5.3)
Sodium: 138 mEq/L (ref 137–147)
TCO2: 24 mmol/L (ref 0–100)

## 2014-03-06 LAB — I-STAT CREATININE, ED: CREATININE: 0.9 mg/dL (ref 0.50–1.35)

## 2014-03-06 MED ORDER — OXYCODONE-ACETAMINOPHEN 5-325 MG PO TABS
1.0000 | ORAL_TABLET | Freq: Once | ORAL | Status: AC
  Administered 2014-03-06: 1 via ORAL
  Filled 2014-03-06: qty 1

## 2014-03-06 NOTE — ED Notes (Signed)
Bladder scan done on pt, no urine noted to be in bladder at this time.

## 2014-03-06 NOTE — ED Provider Notes (Signed)
CSN: [id number]     Arrival date & time 03/06/14  1705 History   First MD Initiated Contact with Patient 03/06/14 1748     Chief Complaint  Patient presents with  . Groin Pain     (Consider location/radiation/quality/duration/timing/severity/associated sxs/prior Treatment) HPI The patient is 68 y.o.Male who recently underwent prostatectomy 7 days ago who presents complaining of suprapubic pain. He says that he noticed some bright red blood in his Foley catheterearlier this afternoon. He spoke with his urologist who told him not to worry about it, this was also normal, and that he would see him tomorrow in the office. Several hours later the patient noticed that after he emptied his Foley bag, but no more urine flow into it. After an hour or 2 he began to experience 10 out of 10 suprapubic pain. After approximately an hour he noticed urine begin to drain into the bag again, and the pain gradually resolved. He denies any generalized abdominal pain, says he just feels sore, unchanged over the past few days. He also notes that he has not had a bowel movement since his surgery. He was on a liquid diet for the first 5 days however.    Past Medical History  Diagnosis Date  . Pancreatitis   . 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 Depression   . Hypertension   . Diabetes mellitus without complication   . Prostate cancer 12/19/13    Gleason 4+3=7, volume 31.31 cc  . Malignant neoplasm of prostate 01/09/2014  . Shortness of breath dyspnea     with exertion   . GERD (gastroesophageal reflux disease)     if drinks alcohol   Past Surgical History  Procedure Laterality Date  . Foot surgery    . Prostate biopsy  12/2013    Gleason 4+3=7, volume 31.31 cc  . Biopsy on throat      hx of   . Robot assisted laparoscopic radical prostatectomy N/A 02/27/2014    Procedure: ROBOTIC ASSISTED LAPAROSCOPIC RADICAL PROSTATECTOMY WITH  INDOCYANINE GREEN DYE;  Surgeon: Alexis Frock, MD;  Location: [facility];  Service: Urology;  Laterality: N/A;  . Lymphadenectomy Bilateral 02/27/2014    Procedure: BILATERAL LYMPHADENECTOMY;  Surgeon: Alexis Frock, MD;  Location: [facility];  Service: Urology;  Laterality: Bilateral;   Family History  Problem Relation Age of Onset  . Heart disease Mother   . Cancer Sister     breast   History  Substance Use Topics  . Smoking status: Current Every Day Smoker -- 0.50 packs/day for 40 years    Types: Cigarettes  . Smokeless tobacco: Never Used  . Alcohol Use: Yes     Comment: binge drinks straight liquor    Review of Systems  Gastrointestinal: Positive for abdominal pain, constipation and rectal pain. Negative for nausea and vomiting.  Genitourinary: Positive for hematuria, difficulty urinating and penile pain.  All other systems reviewed and are negative.     Allergies  Penicillins  Home Medications   Prior to Admission medications   Medication Sig Start Date End Date Taking? Authorizing Provider  buPROPion (WELLBUTRIN SR) 150 MG 12 hr tablet Take 150 mg by mouth daily.    Yes Historical Provider, MD  ciprofloxacin (CIPRO) 500 MG tablet Take 1 tablet (500 mg total) by mouth 2 (two) times daily. Start day prior to office visit for foley removal 02/27/14  Yes Debbrah Alar, PA-C  gabapentin (NEURONTIN) 300  MG capsule Take 300 mg by mouth 3 (three) times daily.   Yes Historical Provider, MD  glimepiride (AMARYL) 4 MG tablet Take 4 mg by mouth daily with breakfast.   Yes Historical Provider, MD  HYDROcodone-acetaminophen (NORCO) 5-325 MG per tablet Take 1-2 tablets by mouth every 6 (six) hours as needed. 02/27/14  Yes Amanda Dancy, PA-C  senna-docusate (SENOKOT-S) 8.6-50 MG per tablet Take 1 tablet by mouth 2 (two) times daily. While taking pain meds to prevent constipation 03/01/14  Yes Alexis Frock, MD   BP 129/76 mmHg  Pulse 97  Temp(Src) 98.3 F (36.8 C) (Oral)  Resp 16   SpO2 96% Physical Exam  Constitutional: He is oriented to person, place, and time. He appears well-developed and well-nourished.  HENT:  Head: Normocephalic and atraumatic.  Eyes: Pupils are equal, round, and reactive to light.  Neck: Normal range of motion.  Cardiovascular: Normal rate and regular rhythm.   Pulmonary/Chest: Effort normal and breath sounds normal.  Abdominal: Soft. He exhibits no distension.  Several abdominal incisions with staples in place and clean dry and intact dressings. Minimal abdominal tenderness diffusely, no specific point tenderness, no rebound or guarding.  Genitourinary:  Foley catheter in place, bright red blood in the Foley bag. Rectal exam normal, no  Blood, soft stool in the vault.  Musculoskeletal: Normal range of motion.  Neurological: He is alert and oriented to person, place, and time.  Skin: Skin is warm and dry.  Psychiatric: He has a normal mood and affect.    ED Course  Procedures (including critical care time) Labs Review Labs Reviewed  I-STAT CHEM 8, ED - Abnormal; Notable for the following:    Glucose, Bld 146 (*)    Hemoglobin 11.6 (*)    HCT 34.0 (*)    All other components within normal limits  I-STAT CREATININE, ED    Imaging Review No results found.   EKG Interpretation None      MDM   Final diagnoses:  None    68 y.o.male presenting with suprapubic pain. Had gradual onset of pain in the context of hematuria in his Foley catheter after abrupt stop of the flow of urine. He reports that the pain resolved completely after his bag began to fill with urine again. On exam the patient has minimal abdominal tenderness consistent with postoperative pain. His surgical sites look clean and intact with no evidence of infection. Bladder was scanned and there was no residual urine in his bladder. Irrigated his Foley, and urine returning after patient was a light pink in color. Hemoglobin at baseline. Doubt any serious active bleeding.  Stops soft stool in the rectal vault, doubt impaction or small bowel obstruction, as the patient's also passing gas. Likely his abdominal pain was caused by urinary retention retention due to transient obstruction of his Foley catheter by a clot.  Patient is pain free with normal vital signs normal renal function stable hemoglobin, as follow-up tomorrow with urology. We'll discharge home with outpatient any further testing or imaging indicated. He is given strong return precautions.     Leata Mouse, MD 03/07/14 [id number]  Dot Lanes, MD 03/14/14 [phone]

## 2014-03-06 NOTE — Discharge Instructions (Signed)

## 2014-03-06 NOTE — ED Notes (Signed)
Family at bedside. 

## 2014-03-06 NOTE — ED Notes (Signed)
Pt requests that bandage be changed to abdomen, new sterile gauze applied and tegaderm applied to LLQ or abdomen. Bleeding controlled at this time, no drainage noted.

## 2014-03-06 NOTE — ED Notes (Signed)
Foley catheter flushed, draining clear yellow urine at this time

## 2014-03-06 NOTE — ED Notes (Signed)
Per EMS, patient had her prostate removed on Friday, patient has urinary catheter in place since that time. Patient reports to EMS that this AM he noticed blood in his drainage bag, went to [facility] and had the bag replaced. Patient states he has had no urinary output since the bag was changed at [facility] despite adequate PO intake. Patient reports he is having significant pain. Patient reports he has an appointment in the morning to have staples removed and the catheter removed.

## 2014-03-06 NOTE — ED Notes (Signed)
Bed: WA06 Expected date: 03/06/14 Expected time: 10:58 PM Means of arrival: Ambulance Comments: 68 yo M abd pain

## 2014-03-06 NOTE — ED Notes (Signed)
Per pt sts he had prostate removed a week ago and no BM since. sts lower abdominal /groin pain. sts also blood in catheter bag.

## 2014-03-07 DIAGNOSIS — R1030 Lower abdominal pain, unspecified: Secondary | ICD-10-CM | POA: Diagnosis not present

## 2014-03-07 MED ORDER — OXYCODONE-ACETAMINOPHEN 5-325 MG PO TABS
2.0000 | ORAL_TABLET | Freq: Once | ORAL | Status: AC
  Administered 2014-03-07: 2 via ORAL
  Filled 2014-03-07: qty 2

## 2014-03-07 NOTE — Discharge Instructions (Signed)
Foley Catheter Care °A Foley catheter is a soft, flexible tube that is placed into the bladder to drain urine. A Foley catheter may be inserted if: °· You leak urine or are not able to control when you urinate (urinary incontinence). °· You are not able to urinate when you need to (urinary retention). °· You had prostate surgery or surgery on the genitals. °· You have certain medical conditions, such as multiple sclerosis, dementia, or a spinal cord injury. °If you are going home with a Foley catheter in place, follow the instructions below. °TAKING CARE OF THE CATHETER °1. Wash your hands with soap and water. °2. Using mild soap and warm water on a clean washcloth: °· Clean the area on your body closest to the catheter insertion site using a circular motion, moving away from the catheter. Never wipe toward the catheter because this could sweep bacteria up into the urethra and cause infection. °· Remove all traces of soap. Pat the area dry with a clean towel. For males, reposition the foreskin. °3. Attach the catheter to your leg so there is no tension on the catheter. Use adhesive tape or a leg strap. If you are using adhesive tape, remove any sticky residue left behind by the previous tape you used. °4. Keep the drainage bag below the level of the bladder, but keep it off the floor. °5. Check throughout the day to be sure the catheter is working and urine is draining freely. Make sure the tubing does not become kinked. °6. Do not pull on the catheter or try to remove it. Pulling could damage internal tissues. °TAKING CARE OF THE DRAINAGE BAGS °You will be given two drainage bags to take home. One is a large overnight drainage bag, and the other is a smaller leg bag that fits underneath clothing. You may wear the overnight bag at any time, but you should never wear the smaller leg bag at night. Follow the instructions below for how to empty, change, and clean your drainage bags. °Emptying the Drainage Bag °You must  empty your drainage bag when it is  -½ full or at least 2-3 times a day. °1. Wash your hands with soap and water. °2. Keep the drainage bag below your hips, below the level of your bladder. This stops urine from going back into the tubing and into your bladder. °3. Hold the dirty bag over the toilet or a clean container. °4. Open the pour spout at the bottom of the bag and empty the urine into the toilet or container. Do not let the pour spout touch the toilet, container, or any other surface. Doing so can place bacteria on the bag, which can cause an infection. °5. Clean the pour spout with a gauze pad or cotton ball that has rubbing alcohol on it. °6. Close the pour spout. °7. Attach the bag to your leg with adhesive tape or a leg strap. °8. Wash your hands well. °Changing the Drainage Bag °Change your drainage bag once a month or sooner if it starts to smell bad or look dirty. Below are steps to follow when changing the drainage bag. °1. Wash your hands with soap and water. °2. Pinch off the rubber catheter so that urine does not spill out. °3. Disconnect the catheter tube from the drainage tube at the connection valve. Do not let the tubes touch any surface. °4. Clean the end of the catheter tube with an alcohol wipe. Use a different alcohol wipe to clean the   end of the drainage tube. °5. Connect the catheter tube to the drainage tube of the clean drainage bag. °6. Attach the new bag to the leg with adhesive tape or a leg strap. Avoid attaching the new bag too tightly. °7. Wash your hands well. °Cleaning the Drainage Bag °1. Wash your hands with soap and water. °2. Wash the bag in warm, soapy water. °3. Rinse the bag thoroughly with warm water. °4. Fill the bag with a solution of white vinegar and water (1 cup vinegar to 1 qt warm water [.2 L vinegar to 1 L warm water]). Close the bag and soak it for 30 minutes in the solution. °5. Rinse the bag with warm water. °6. Hang the bag to dry with the pour spout open  and hanging downward. °7. Store the clean bag (once it is dry) in a clean plastic bag. °8. Wash your hands well. °PREVENTING INFECTION °· Wash your hands before and after handling your catheter. °· Take showers daily and wash the area where the catheter enters your body. Do not take baths. Replace wet leg straps with dry ones, if this applies. °· Do not use powders, sprays, or lotions on the genital area. Only use creams, lotions, or ointments as directed by your caregiver. °· For females, wipe from front to back after each bowel movement. °· Drink enough fluids to keep your urine clear or pale yellow unless you have a fluid restriction. °· Do not let the drainage bag or tubing touch or lie on the floor. °· Wear cotton underwear to absorb moisture and to keep your skin drier. °SEEK MEDICAL CARE IF:  °· Your urine is cloudy or smells unusually bad. °· Your catheter becomes clogged. °· You are not draining urine into the bag or your bladder feels full. °· Your catheter starts to leak. °SEEK IMMEDIATE MEDICAL CARE IF:  °· You have pain, swelling, redness, or pus where the catheter enters the body. °· You have pain in the abdomen, legs, lower back, or bladder. °· You have a fever. °· You see blood fill the catheter, or your urine is pink or red. °· You have nausea, vomiting, or chills. °· Your catheter gets pulled out. °MAKE SURE YOU:  °· Understand these instructions. °· Will watch your condition. °· Will get help right away if you are not doing well or get worse. °Document Released: 04/05/2005 Document Revised: 08/20/2013 Document Reviewed: 03/27/2012 °ExitCare® Patient Information ©2015 [employer]. This information is not intended to replace advice given to you by your health care provider. Make sure you discuss any questions you have with your health care provider. ° °

## 2014-03-07 NOTE — ED Provider Notes (Signed)
CSN: [id number]     Arrival date & time 03/06/14  2309 History   First MD Initiated Contact with Patient 03/06/14 2356     Chief Complaint  Patient presents with  . Urinary Retention  . Penis Pain      HPI Patient is a days postop from robotic-assisted laparoscopic radical prostatectomy by Dr. Tresa Moore.  Patient reports he developed some hematuria in his catheter this morning and presented initially to an outside emergency department with complaints of decreased urinary frequency.  Since his ER visit he has been unable to urinate.  Prior to my evaluation the patient had his catheter flushed by nursing staff he states he feels much better.  He has a draining catheter at this time.  Patient denies fevers or chills.  No nausea or vomiting.  No other complaints.  He is scheduled to see urology in the morning to have his catheter removed.   Past Medical History  Diagnosis Date  . Pancreatitis   . 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 Depression   . Hypertension   . Diabetes mellitus without complication   . Prostate cancer 12/19/13    Gleason 4+3=7, volume 31.31 cc  . Malignant neoplasm of prostate 01/09/2014  . Shortness of breath dyspnea     with exertion   . GERD (gastroesophageal reflux disease)     if drinks alcohol   Past Surgical History  Procedure Laterality Date  . Foot surgery    . Prostate biopsy  12/2013    Gleason 4+3=7, volume 31.31 cc  . Biopsy on throat      hx of   . Robot assisted laparoscopic radical prostatectomy N/A 02/27/2014    Procedure: ROBOTIC ASSISTED LAPAROSCOPIC RADICAL PROSTATECTOMY WITH INDOCYANINE GREEN DYE;  Surgeon: Alexis Frock, MD;  Location: [facility];  Service: Urology;  Laterality: N/A;  . Lymphadenectomy Bilateral 02/27/2014    Procedure: BILATERAL LYMPHADENECTOMY;  Surgeon: Alexis Frock, MD;  Location: [facility];  Service: Urology;  Laterality: Bilateral;   Family  History  Problem Relation Age of Onset  . Heart disease Mother   . Cancer Sister     breast   History  Substance Use Topics  . Smoking status: Current Every Day Smoker -- 0.50 packs/day for 40 years    Types: Cigarettes  . Smokeless tobacco: Never Used  . Alcohol Use: Yes     Comment: binge drinks straight liquor    Review of Systems  All other systems reviewed and are negative.     Allergies  Penicillins  Home Medications   Prior to Admission medications   Medication Sig Start Date End Date Taking? Authorizing Provider  aspirin 81 MG tablet Take 81 mg by mouth daily.   Yes Historical Provider, MD  buPROPion (WELLBUTRIN SR) 150 MG 12 hr tablet Take 150 mg by mouth daily.    Yes Historical Provider, MD  gabapentin (NEURONTIN) 300 MG capsule Take 300 mg by mouth 3 (three) times daily.   Yes Historical Provider, MD  glimepiride (AMARYL) 4 MG tablet Take 4 mg by mouth daily with breakfast.   Yes Historical Provider, MD  HYDROcodone-acetaminophen (NORCO) 5-325 MG per tablet Take 1-2 tablets by mouth every 6 (six) hours as needed. 02/27/14  Yes Amanda Dancy, PA-C  senna-docusate (SENOKOT-S) 8.6-50 MG per tablet Take 1 tablet by mouth 2 (two) times daily. While taking pain meds to prevent constipation 03/01/14  Yes Alexis Frock,  MD  ciprofloxacin (CIPRO) 500 MG tablet Take 1 tablet (500 mg total) by mouth 2 (two) times daily. Start day prior to office visit for foley removal 02/27/14   Amanda Dancy, PA-C   BP 158/72 mmHg  Pulse 103  Temp(Src) 98.6 F (37 C) (Oral)  Resp 18  SpO2 96% Physical Exam  Constitutional: He is oriented to person, place, and time. He appears well-developed and well-nourished.  HENT:  Head: Normocephalic.  Eyes: EOM are normal.  Neck: Normal range of motion.  Pulmonary/Chest: Effort normal.  Abdominal: He exhibits no distension. There is no tenderness.  Genitourinary:  Foley catheter in place.  No bleeding around the catheter.  Bedside ultrasound  demonstrates decompressed bladder.  Balloon noted in bladder.  Foley bag with yellow urine.  No blood clots.  No hematuria noted.  Musculoskeletal: Normal range of motion.  Neurological: He is alert and oriented to person, place, and time.  Psychiatric: He has a normal mood and affect.  Nursing note and vitals reviewed.   ED Course  Procedures (including critical care time) Labs Review Labs Reviewed  URINE CULTURE    Imaging Review No results found.   EKG Interpretation None      MDM   Final diagnoses:  Obstructed Foley catheter, initial encounter    Decompressed bladder at this time.  Patient is feeling better.  Family instructed in flushing of the catheter.  Will follow-up with urology in the morning and likely of his catheter discontinued.  I think this is best to have the urologist remove the catheter.  I do not think he needs it removed tonight.  He appears to be functioning fine now.  Urine culture sent.    Hoy Morn, MD 03/07/14 [phone]

## 2014-03-08 LAB — URINE CULTURE
Colony Count: NO GROWTH
Culture: NO GROWTH

## 2014-12-03 ENCOUNTER — Encounter: Payer: Self-pay | Admitting: Podiatry

## 2014-12-03 ENCOUNTER — Ambulatory Visit (INDEPENDENT_AMBULATORY_CARE_PROVIDER_SITE_OTHER): Payer: Medicare Other | Admitting: Podiatry

## 2014-12-03 VITALS — BP 164/99 | HR 93 | Resp 12

## 2014-12-03 DIAGNOSIS — L84 Corns and callosities: Secondary | ICD-10-CM

## 2014-12-03 DIAGNOSIS — E1140 Type 2 diabetes mellitus with diabetic neuropathy, unspecified: Secondary | ICD-10-CM | POA: Diagnosis not present

## 2014-12-03 NOTE — Progress Notes (Signed)
   Subjective:    Patient ID: Gregory Nunez, male    DOB: [birth year], 69 y.o.   MRN: [id number]  HPI   Pt presents for diabetic foot exam, nail debridement. He c/o of painful corn interdigital right left 1st and 2nd toe.  On the right foot denies any history of foot ulceration or claudication. He said foot surgery on the left foot by Dr. Blenda Mounts including bunion and hammertoe  Review of Systems  All other systems reviewed and are negative.      Objective:   Physical Exam   orientated 3  Vascular: DP and PT pulses 2/4 bilaterally Capillary reflex immediate bilaterally  Neurological: Sensation to 10 g monofilament wire intact 1/5 right and 3/5 left Ankle reflexes weekly reactive bilaterally Vibratory sensation nonreactive bilaterally  Dermatological: Well-healed surgical scar dorsal medial first MPJ left and second toe left Keratoses medial border second right toe without any surrounding erythema, edema or drainage Plantar callus fifth right MPJ  Musculoskeletal: HAV right Hammertoe second rig      Assessment & Plan:   Assessment: Satisfactory vascular status 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 Also, made aware that keratoses in the second right toe was friction from the HAV bunion. I debrided the keratoses in the medial border of the second right toe and the plantar keratoses fifth right MPJ Patient is made aware that these both these lesions would recur  Reappoint at patient's request

## 2014-12-03 NOTE — Patient Instructions (Signed)
Diabetes and Foot Care Diabetes may cause you to have problems because of poor blood supply (circulation) to your feet and legs. This may cause the skin on your feet to become thinner, break easier, and heal more slowly. Your skin may become dry, and the skin may peel and crack. You may also have nerve damage in your legs and feet causing decreased feeling in them. You may not notice minor injuries to your feet that could lead to infections or more serious problems. Taking care of your feet is one of the most important things you can do for yourself.  HOME CARE INSTRUCTIONS  Wear shoes at all times, even in the house. Do not go barefoot. Bare feet are easily injured.  Check your feet daily for blisters, cuts, and redness. If you cannot see the bottom of your feet, use a mirror or ask someone for help.  Wash your feet with warm water (do not use hot water) and mild soap. Then pat your feet and the areas between your toes until they are completely dry. Do not soak your feet as this can dry your skin.  Apply a moisturizing lotion or petroleum jelly (that does not contain alcohol and is unscented) to the skin on your feet and to dry, brittle toenails. Do not apply lotion between your toes.  Trim your toenails straight across. Do not dig under them or around the cuticle. File the edges of your nails with an emery board or nail file.  Do not cut corns or calluses or try to remove them with medicine.  Wear clean socks or stockings every day. Make sure they are not too tight. Do not wear knee-high stockings since they may decrease blood flow to your legs.  Wear shoes that fit properly and have enough cushioning. To break in new shoes, wear them for just a few hours a day. This prevents you from injuring your feet. Always look in your shoes before you put them on to be sure there are no objects inside.  Do not cross your legs. This may decrease the blood flow to your feet.  If you find a minor scrape,  cut, or break in the skin on your feet, keep it and the skin around it clean and dry. These areas may be cleansed with mild soap and water. Do not cleanse the area with peroxide, alcohol, or iodine.  When you remove an adhesive bandage, be sure not to damage the skin around it.  If you have a wound, look at it several times a day to make sure it is healing.  Do not use heating pads or hot water bottles. They may burn your skin. If you have lost feeling in your feet or legs, you may not know it is happening until it is too late.  Make sure your health care provider performs a complete foot exam at least annually or more often if you have foot problems. Report any cuts, sores, or bruises to your health care provider immediately. SEEK MEDICAL CARE IF:   You have an injury that is not healing.  You have cuts or breaks in the skin.  You have an ingrown nail.  You notice redness on your legs or feet.  You feel burning or tingling in your legs or feet.  You have pain or cramps in your legs and feet.  Your legs or feet are numb.  Your feet always feel cold. SEEK IMMEDIATE MEDICAL CARE IF:   There is increasing redness,   swelling, or pain in or around a wound.  There is a red line that goes up your leg.  Pus is coming from a wound.  You develop a fever or as directed by your health care provider.  You notice a bad smell coming from an ulcer or wound. Document Released: 04/02/2000 Document Revised: 12/06/2012 Document Reviewed: 09/12/2012 Exit[employer] Patient Information 2015 [employer]. This information is not intended to replace advice given to you by your health care provider. Make sure you discuss any questions you have with your health care provider.  

## 2015-01-27 ENCOUNTER — Emergency Department (HOSPITAL_COMMUNITY)
Admission: EM | Admit: 2015-01-27 | Discharge: 2015-01-27 | Disposition: A | Discharge status: Home / Self Care | Payer: No Typology Code available for payment source | Attending: Emergency Medicine | Admitting: Emergency Medicine

## 2015-01-27 ENCOUNTER — Encounter (HOSPITAL_COMMUNITY): Payer: Self-pay | Admitting: Emergency Medicine

## 2015-01-27 DIAGNOSIS — T148 Other injury of unspecified body region: Secondary | ICD-10-CM | POA: Diagnosis not present

## 2015-01-27 DIAGNOSIS — F329 Major depressive disorder, single episode, unspecified: Secondary | ICD-10-CM | POA: Diagnosis not present

## 2015-01-27 DIAGNOSIS — S199XXA Unspecified injury of neck, initial encounter: Secondary | ICD-10-CM | POA: Diagnosis present

## 2015-01-27 DIAGNOSIS — Z7982 Long term (current) use of aspirin: Secondary | ICD-10-CM | POA: Diagnosis not present

## 2015-01-27 DIAGNOSIS — Z8739 Personal history of other diseases of the musculoskeletal system and connective tissue: Secondary | ICD-10-CM | POA: Diagnosis not present

## 2015-01-27 DIAGNOSIS — Y92410 Unspecified street and highway as the place of occurrence of the external cause: Secondary | ICD-10-CM | POA: Insufficient documentation

## 2015-01-27 DIAGNOSIS — Z8719 Personal history of other diseases of the digestive system: Secondary | ICD-10-CM | POA: Diagnosis not present

## 2015-01-27 DIAGNOSIS — T148XXA Other injury of unspecified body region, initial encounter: Secondary | ICD-10-CM

## 2015-01-27 DIAGNOSIS — Z8546 Personal history of malignant neoplasm of prostate: Secondary | ICD-10-CM | POA: Diagnosis not present

## 2015-01-27 DIAGNOSIS — Y998 Other external cause status: Secondary | ICD-10-CM | POA: Diagnosis not present

## 2015-01-27 DIAGNOSIS — Z79899 Other long term (current) drug therapy: Secondary | ICD-10-CM | POA: Diagnosis not present

## 2015-01-27 DIAGNOSIS — I10 Essential (primary) hypertension: Secondary | ICD-10-CM | POA: Insufficient documentation

## 2015-01-27 DIAGNOSIS — Z880 Allergy status to penicillin: Secondary | ICD-10-CM | POA: Insufficient documentation

## 2015-01-27 DIAGNOSIS — S299XXA Unspecified injury of thorax, initial encounter: Secondary | ICD-10-CM | POA: Insufficient documentation

## 2015-01-27 DIAGNOSIS — Z720 Tobacco use: Secondary | ICD-10-CM | POA: Diagnosis not present

## 2015-01-27 DIAGNOSIS — Y9389 Activity, other specified: Secondary | ICD-10-CM | POA: Insufficient documentation

## 2015-01-27 DIAGNOSIS — E119 Type 2 diabetes mellitus without complications: Secondary | ICD-10-CM | POA: Diagnosis not present

## 2015-01-27 MED ORDER — METHOCARBAMOL 500 MG PO TABS: 500.0000 mg | ORAL_TABLET | Freq: Two times a day (BID) | ORAL | Status: DC

## 2015-01-27 MED ORDER — IBUPROFEN 800 MG PO TABS: 800.0000 mg | ORAL_TABLET | Freq: Three times a day (TID) | ORAL | Status: DC
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
## 2015-01-27 NOTE — ED Notes (Signed)
PA at bedside.
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
## 2015-02-18 ENCOUNTER — Emergency Department (HOSPITAL_COMMUNITY): Payer: Medicare Other

## 2015-02-18 ENCOUNTER — Emergency Department (HOSPITAL_COMMUNITY)
Admission: EM | Admit: 2015-02-18 | Discharge: 2015-02-18 | Disposition: A | Discharge status: Home / Self Care | Payer: Medicare Other | Attending: Emergency Medicine | Admitting: Emergency Medicine

## 2015-02-18 ENCOUNTER — Encounter (HOSPITAL_COMMUNITY): Payer: Self-pay | Admitting: Emergency Medicine

## 2015-02-18 DIAGNOSIS — Z8546 Personal history of malignant neoplasm of prostate: Secondary | ICD-10-CM | POA: Diagnosis not present

## 2015-02-18 DIAGNOSIS — F17210 Nicotine dependence, cigarettes, uncomplicated: Secondary | ICD-10-CM | POA: Diagnosis not present

## 2015-02-18 DIAGNOSIS — R042 Hemoptysis: Secondary | ICD-10-CM | POA: Insufficient documentation

## 2015-02-18 DIAGNOSIS — R630 Anorexia: Secondary | ICD-10-CM | POA: Insufficient documentation

## 2015-02-18 DIAGNOSIS — Z7982 Long term (current) use of aspirin: Secondary | ICD-10-CM | POA: Diagnosis not present

## 2015-02-18 DIAGNOSIS — Z79899 Other long term (current) drug therapy: Secondary | ICD-10-CM | POA: Diagnosis not present

## 2015-02-18 DIAGNOSIS — Z880 Allergy status to penicillin: Secondary | ICD-10-CM | POA: Diagnosis not present

## 2015-02-18 DIAGNOSIS — Z8739 Personal history of other diseases of the musculoskeletal system and connective tissue: Secondary | ICD-10-CM | POA: Diagnosis not present

## 2015-02-18 DIAGNOSIS — E119 Type 2 diabetes mellitus without complications: Secondary | ICD-10-CM | POA: Insufficient documentation

## 2015-02-18 DIAGNOSIS — F329 Major depressive disorder, single episode, unspecified: Secondary | ICD-10-CM | POA: Diagnosis not present

## 2015-02-18 DIAGNOSIS — R0602 Shortness of breath: Secondary | ICD-10-CM | POA: Diagnosis not present

## 2015-02-18 DIAGNOSIS — K219 Gastro-esophageal reflux disease without esophagitis: Secondary | ICD-10-CM | POA: Insufficient documentation

## 2015-02-18 DIAGNOSIS — R05 Cough: Secondary | ICD-10-CM | POA: Diagnosis present

## 2015-02-18 DIAGNOSIS — I10 Essential (primary) hypertension: Secondary | ICD-10-CM | POA: Insufficient documentation

## 2015-02-18 LAB — CBC WITH DIFFERENTIAL/PLATELET
BASOS PCT: 0 %
Basophils Absolute: 0 10*3/uL (ref 0.0–0.1)
Eosinophils Absolute: 0.2 10*3/uL (ref 0.0–0.7)
Eosinophils Relative: 4 %
HEMATOCRIT: 34 % — AB (ref 39.0–52.0)
Hemoglobin: 11.5 g/dL — ABNORMAL LOW (ref 13.0–17.0)
LYMPHS ABS: 2.2 10*3/uL (ref 0.7–4.0)
LYMPHS PCT: 44 %
MCH: 31.8 pg (ref 26.0–34.0)
MCHC: 33.8 g/dL (ref 30.0–36.0)
MCV: 93.9 fL (ref 78.0–100.0)
MONO ABS: 0.5 10*3/uL (ref 0.1–1.0)
MONOS PCT: 10 %
NEUTROS ABS: 2.1 10*3/uL (ref 1.7–7.7)
NEUTROS PCT: 42 %
PLATELETS: 148 10*3/uL — AB (ref 150–400)
RBC: 3.62 MIL/uL — ABNORMAL LOW (ref 4.22–5.81)
RDW: 12.3 % (ref 11.5–15.5)
WBC: 4.9 10*3/uL (ref 4.0–10.5)

## 2015-02-18 LAB — BASIC METABOLIC PANEL
ANION GAP: 6 (ref 5–15)
BUN: 14 mg/dL (ref 6–20)
CALCIUM: 9.1 mg/dL (ref 8.9–10.3)
CO2: 27 mmol/L (ref 22–32)
CREATININE: 0.84 mg/dL (ref 0.61–1.24)
Chloride: 109 mmol/L (ref 101–111)
GFR calc Af Amer: >60 mL/min (ref >60)
GLUCOSE: 149 mg/dL — AB (ref 65–99)
Potassium: 4.1 mmol/L (ref 3.5–5.1)
Sodium: 142 mmol/L (ref 135–145)

## 2015-02-18 LAB — BRAIN NATRIURETIC PEPTIDE: B Natriuretic Peptide: 25.9 pg/mL (ref 0.0–100.0)

## 2015-02-18 LAB — D-DIMER, QUANTITATIVE: D-Dimer, Quant: 0.38 ug/mL-FEU (ref 0.00–0.48)

## 2015-02-18 LAB — TROPONIN I: Troponin I: <0.03 ng/mL (ref <0.031)

## 2015-02-18 MED ORDER — DOXYCYCLINE HYCLATE 100 MG PO CAPS: 100.0000 mg | ORAL_CAPSULE | Freq: Two times a day (BID) | ORAL | Status: DC

## 2015-02-18 MED ORDER — IOHEXOL 350 MG/ML SOLN
100.0000 mL | Freq: Once | INTRAVENOUS | Status: AC | PRN
  Administered 2015-02-18: 100 mL via INTRAVENOUS

## 2015-02-18 NOTE — ED Notes (Signed)
Pt has been coughing up blood intermittently for the past few months. Went to PCP on Friday for this. Had a chest x ray done today, but has not gotten results back yet. Pt reports the cough along with generalized weakness.

## 2015-02-18 NOTE — Progress Notes (Signed)
EDCM spoke to patient at bedside.  Patient confirms his pcp is Dr. Jeanie Cooks.  System updated.

## 2015-02-18 NOTE — ED Provider Notes (Signed)
CSN: [id number]     Arrival date & time 02/18/15  1756 History   First MD Initiated Contact with Patient 02/18/15 1950     Chief Complaint  Patient presents with  . Cough     (Consider location/radiation/quality/duration/timing/severity/associated sxs/prior Treatment) HPI Comments: Patient states he's been having intermittent hemoptysis for the past 3 months. He saw his PCP 3 days ago and had a chest x-ray but does not know the results. He came in tonight because the cough became worse with some generalized weakness. The weakness has resolved after he ate some food. He believes his blood sugar was low. Denies any chest pain or fever. No weight loss. No intentional weight loss. No night sweats. No travel outside the country. He is a smoker. States he's had intermittent hemoptysis maybe 2 or 3 times a week with streaks of blood in his sputum and sometimes of blood clots. No leg pain or leg swelling. No fever. No abdominal pain or vomiting. Came in tonight because he had more generalized weakness.  Patient is a 69 y.o. male presenting with cough. The history is provided by the patient.  Cough Associated symptoms: shortness of breath   Associated symptoms: no chest pain, no fever and no headaches     Past Medical History  Diagnosis Date  . Pancreatitis   . 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 Depression   . Hypertension   . Diabetes mellitus without complication ([place])   . 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 Malignant neoplasm of prostate ([place]) 01/09/2014  . Shortness of breath dyspnea     with exertion   . GERD (gastroesophageal reflux disease)     if drinks alcohol   Past Surgical History  Procedure Laterality Date  . Foot surgery    . Prostate biopsy  12/2013    Gleason 4+3=7, volume 31.31 cc  . Biopsy on throat      hx of   . Robot assisted laparoscopic radical  prostatectomy N/A 02/27/2014    Procedure: ROBOTIC ASSISTED LAPAROSCOPIC RADICAL PROSTATECTOMY WITH INDOCYANINE GREEN DYE;  Surgeon: Alexis Frock, MD;  Location: [facility];  Service: Urology;  Laterality: N/A;  . Lymphadenectomy Bilateral 02/27/2014    Procedure: BILATERAL LYMPHADENECTOMY;  Surgeon: Alexis Frock, MD;  Location: [facility];  Service: Urology;  Laterality: Bilateral;   Family History  Problem Relation Age of Onset  . Heart disease Mother   . Cancer Sister     breast   Social History  Substance Use Topics  . Smoking status: Current Every Day Smoker -- 0.50 packs/day for 40 years    Types: Cigarettes  . Smokeless tobacco: Never Used  . Alcohol Use: Yes     Comment: binge drinks straight liquor    Review of Systems  Constitutional: Positive for activity change and appetite change. Negative for fever.  HENT: Negative for congestion and nosebleeds.   Eyes: Negative for visual disturbance.  Respiratory: Positive for cough and shortness of breath. Negative for chest tightness.   Cardiovascular: Negative for chest pain.  Gastrointestinal: Negative for nausea and vomiting.  Genitourinary: Negative for dysuria, hematuria and scrotal swelling.  Musculoskeletal: Negative for arthralgias and gait problem.  Skin: Negative for color change.  Neurological: Negative for dizziness, weakness and headaches.    A complete 10 system review of systems was obtained and all systems are negative except as noted in  the HPI and PMH.    Allergies  Penicillins  Home Medications   Prior to Admission medications   Medication Sig Start Date End Date Taking? Authorizing Provider  aspirin EC 81 MG tablet Take 81 mg by mouth daily.   Yes Historical Provider, MD  buPROPion (WELLBUTRIN SR) 150 MG 12 hr tablet Take 150 mg by mouth daily.    Yes Historical Provider, MD  busPIRone (BUSPAR) 5 MG tablet Take 5 mg by mouth daily. 02/14/15  Yes Historical Provider, MD  gabapentin (NEURONTIN) 300 MG  capsule Take 300 mg by mouth 2 (two) times daily.    Yes Historical Provider, MD  glimepiride (AMARYL) 4 MG tablet Take 4 mg by mouth daily with breakfast.   Yes Historical Provider, MD  hydrOXYzine (ATARAX/VISTARIL) 50 MG tablet Take 50 mg by mouth daily.   Yes Historical Provider, MD  ibuprofen (ADVIL,MOTRIN) 800 MG tablet Take 1 tablet (800 mg total) by mouth 3 (three) times daily. Patient taking differently: Take 800 mg by mouth every 8 (eight) hours as needed for moderate pain.  01/27/15  Yes Shari Upstill, PA-C  methocarbamol (ROBAXIN) 500 MG tablet Take 1 tablet (500 mg total) by mouth 2 (two) times daily. 01/27/15  Yes Charlann Lange, PA-C  Naphazoline-Glycerin (CLEAR EYES MAX REDNESS RELIEF OP) Apply 2 drops to eye daily as needed (red eyes).   Yes Historical Provider, MD  omeprazole (PRILOSEC) 20 MG capsule Take 20 mg by mouth 2 (two) times daily. 02/06/15  Yes Historical Provider, MD  doxycycline (VIBRAMYCIN) 100 MG capsule Take 1 capsule (100 mg total) by mouth 2 (two) times daily. 02/18/15   Ezequiel Essex, MD   BP 165/93 mmHg  Pulse 92  Temp(Src) 98 F (36.7 C) (Oral)  Resp 20  Ht 5\' 11"  (1.803 m)  Wt 180 lb (81.647 kg)  BMI 25.12 kg/m2  SpO2 98% Physical Exam  Constitutional: He is oriented to person, place, and time. He appears well-developed and well-nourished. No distress.  HENT:  Head: Normocephalic and atraumatic.  Mouth/Throat: Oropharynx is clear and moist. No oropharyngeal exudate.  Eyes: Conjunctivae and EOM are normal. Pupils are equal, round, and reactive to light.  Neck: Normal range of motion. Neck supple.  No meningismus.  Cardiovascular: Normal rate, regular rhythm, normal heart sounds and intact distal pulses.   No murmur heard. Pulmonary/Chest: Effort normal and breath sounds normal. No respiratory distress. He has no wheezes. He exhibits no tenderness.  Abdominal: Soft. There is no tenderness. There is no rebound and no guarding.  Musculoskeletal: Normal  range of motion. He exhibits no edema or tenderness.  Neurological: He is alert and oriented to person, place, and time. No cranial nerve deficit. He exhibits normal muscle tone. Coordination normal.  No ataxia on finger to nose bilaterally. No pronator drift. 5/5 strength throughout. CN 2-12 intact. Negative Romberg. Equal grip strength. Sensation intact. Gait is normal.   Skin: Skin is warm.  Psychiatric: He has a normal mood and affect. His behavior is normal.  Nursing note and vitals reviewed.   ED Course  Procedures (including critical care time) Labs Review Labs Reviewed  CBC WITH DIFFERENTIAL/PLATELET - Abnormal; Notable for the following:    RBC 3.62 (*)    Hemoglobin 11.5 (*)    HCT 34.0 (*)    Platelets 148 (*)    All other components within normal limits  BASIC METABOLIC PANEL - Abnormal; Notable for the following:    Glucose, Bld 149 (*)    All other  components within normal limits  TROPONIN I  BRAIN NATRIURETIC PEPTIDE  D-DIMER, QUANTITATIVE (NOT AT [facility])    Imaging Review Dg Chest 2 View  02/18/2015  CLINICAL DATA:  Intermittent hemoptysis for 3 months. History of prostate carcinoma EXAM: CHEST  2 VIEW COMPARISON:  February 22, 2014 FINDINGS: There is a 1.6 x 1.1 cm nodular opacity in the left lower lobe region adjacent to the left heart border. The lungs elsewhere are clear. Heart size and pulmonary vascularity are normal. No adenopathy. There is degenerative change in the lower thoracic spine. IMPRESSION: Nodular lesion left lower lobe seen only on the frontal view measuring 1.6 x 1.1 cm. Advise noncontrast enhanced chest CT to further assess. This opacity was not appreciable on prior study. Lungs elsewhere clear.  No demonstrable adenopathy. Electronically Signed   By: Lowella Grip III M.D.   On: 02/18/2015 19:25   Ct Angio Chest Pe W/cm &/or Wo Cm  02/18/2015  CLINICAL DATA:  Chronic intermittent hole mild ptosis over the past few months. Current smoker. Personal  history of prostate cancer post prostatectomy. EXAM: CT ANGIOGRAPHY CHEST WITH CONTRAST TECHNIQUE: Multidetector CT imaging of the chest was performed using the standard protocol during bolus administration of intravenous contrast. Multiplanar CT image reconstructions and MIPs were obtained to evaluate the vascular anatomy. CONTRAST:  144mL OMNIPAQUE IOHEXOL 350 MG/ML IV. COMPARISON:  CT chest 05/06/2012. FINDINGS: Contrast opacification of pulmonary arteries is good. No filling defects within either main pulmonary artery or their branches in either lung to suggest pulmonary embolism. Heart size upper normal with left ventricular hypertrophy. Very small pericardial effusion in the superior recess. Mild atherosclerosis involving the thoracic and upper abdominal aorta without aneurysm. Atelectasis in the lower lobes related to low lung volumes. Vague nodules identified on the prior CT are not visible currently, indicating that they were inflammatory in origin. No confluent airspace consolidation. No pulmonary parenchymal nodules or masses. Emphysematous changes as noted previously. Central airways patent with moderate to marked bronchial wall thickening. No pathologic mediastinal, hilar or axillary lymphadenopathy. Thyroid gland normal in appearance. Esophagus normal in appearance throughout the mediastinum. Visualized upper abdomen unremarkable for the early arterial phase enhancement. Bone window images demonstrate mid and lower thoracic spondylosis and severe degenerative changes in the right sternoclavicular joint. Review of the MIP images confirms the above findings. IMPRESSION: 1. No evidence of pulmonary embolism. 2. COPD/emphysema. Mild atelectasis in the lower lobes related to low lung volumes. No acute cardiopulmonary disease otherwise. Electronically Signed   By: Evangeline Dakin M.D.   On: 02/18/2015 22:47   I have personally reviewed and evaluated these images and lab results as part of my medical  decision-making.   EKG Interpretation   Date/Time:  Tuesday February 18 2015 20:03:18 EDT Ventricular Rate:  94 PR Interval:  164 QRS Duration: 93 QT Interval:  372 QTC Calculation: 465 R Axis:   -49 Text Interpretation:  Sinus rhythm Probable left atrial enlargement Left  axis deviation RSR' in V1 or V2, right VCD or RVH Nonspecific T  abnormalities, lateral leads No significant change was found Confirmed by  Wyvonnia Dusky  MD, Samanthan Dugo [phone]) on 02/18/2015 8:39:27 PM      MDM   Final diagnoses:  Hemoptysis   intermittent hemoptysis for the past 3 months. Also endorses shortness of breath. No hypoxia. Lungs are clear. X-ray shows possible nodular lesion in the left lower lobe.  Well appearing, no distress, lungs clear. No hypoxia.  The lung nodule seen on x-ray is not  seen on CT scan. There is no pulmonary embolism or other explanation of Patient's hemoptysis. Patient is stable and in no distress. No hypoxia.  Hemoglobin is stable.  We'll treat empirically with antibiotics for possible bronchitis. Will need pulmonary follow-up. Discussed smoking cessation. Return to the ED with worsening symptoms including shortness of breath, worsening hemoptysis or chest pain.    Ezequiel Essex, MD 02/18/15 [id number]

## 2015-02-18 NOTE — Discharge Instructions (Signed)
Hemoptysis There is no evidence of blood clot or pneumonia. Take antibiotic as prescribed. Follow-up with the lung doctors. return to the ED if you develop new or worsening symptoms. Hemoptysis, which means coughing up blood, can be a sign of a minor problem or a serious medical condition. The blood that is coughed up may come from the lungs and airways. Coughed-up blood can also come from bleeding that occurs outside the lungs and airways. Blood can drain into the windpipe during a severe nosebleed or when blood is vomited from the stomach. Because hemoptysis can be a sign of something serious, a medical evaluation is required. For some people with hemoptysis, no definite cause is ever identified. CAUSES  The most common cause of hemoptysis is bronchitis. Some other common causes include:   A ruptured blood vessel caused by coughing or an infection.   A medical condition that causes damage to the large air passageways (bronchiectasis).   A blood clot in the lungs (pulmonary embolism).   Pneumonia.   Tuberculosis.   Breathing in a small foreign object.   Cancer. For some people with hemoptysis, no definite cause is ever identified.  HOME CARE INSTRUCTIONS  Only take over-the-counter or prescription medicines as directed by your caregiver. Do not use cough suppressants unless your caregiver approves.  If your caregiver prescribes antibiotic medicines, take them as directed. Finish them even if you start to feel better.  Do not smoke. Also avoid secondhand smoke.  Follow up with your caregiver as directed. SEEK IMMEDIATE MEDICAL CARE IF:   You cough up bloody mucus for longer than a week.  You have a blood-producing cough that is severe or getting worse.  You have a blood-producing cough thatcomes and goes over time.  You develop problems with your breathing.   You vomit blood.  You develop bloody or black-colored stools.  You have chest pain.   You develop night  sweats.  You feel faint or pass out.   You have a fever or persistent symptoms for more than 2-3 days.  You have a fever and your symptoms suddenly get worse. MAKE SURE YOU:  Understand these instructions.  Will watch your condition.  Will get help right away if you are not doing well or get worse.   This information is not intended to replace advice given to you by your health care provider. Make sure you discuss any questions you have with your health care provider.   Document Released: 06/14/2001 Document Revised: 03/22/2012 Document Reviewed: 01/21/2012 Elsevier Interactive Patient Education [employer].

## 2015-05-21 ENCOUNTER — Encounter (HOSPITAL_COMMUNITY): Payer: Self-pay | Admitting: *Deleted

## 2015-05-21 ENCOUNTER — Emergency Department (INDEPENDENT_AMBULATORY_CARE_PROVIDER_SITE_OTHER)
Admission: EM | Admit: 2015-05-21 | Discharge: 2015-05-21 | Disposition: A | Discharge status: Home / Self Care | Payer: Medicare Other | Source: Home / Self Care | Attending: Family Medicine | Admitting: Family Medicine

## 2015-05-21 DIAGNOSIS — H9201 Otalgia, right ear: Secondary | ICD-10-CM | POA: Diagnosis not present

## 2015-05-21 NOTE — ED Provider Notes (Signed)
CSN: [id number]     Arrival date & time 05/21/15  1301 History   First MD Initiated Contact with Patient 05/21/15 1312     Chief Complaint  Patient presents with  . Otalgia   (Consider location/radiation/quality/duration/timing/severity/associated sxs/prior Treatment) Patient is a 70 y.o. male presenting with ear pain. The history is provided by the patient.  Otalgia Location:  Right Behind ear:  No abnormality Quality:  Dull Severity:  No pain Onset quality:  Sudden Progression:  Unchanged Chronicity:  New Context comment:  Pt concerned about Q-tip end coming off in right ear. no pain or bleeding.or hearing probl. Relieved by:  None tried Worsened by:  Nothing tried Ineffective treatments:  None tried Associated symptoms: no ear discharge and no hearing loss     Past Medical History  Diagnosis Date  . Pancreatitis   . 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 Depression   . Hypertension   . Diabetes mellitus without complication ([place])   . 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 Malignant neoplasm of prostate ([place]) 01/09/2014  . Shortness of breath dyspnea     with exertion   . GERD (gastroesophageal reflux disease)     if drinks alcohol   Past Surgical History  Procedure Laterality Date  . Foot surgery    . Prostate biopsy  12/2013    Gleason 4+3=7, volume 31.31 cc  . Biopsy on throat      hx of   . Robot assisted laparoscopic radical prostatectomy N/A 02/27/2014    Procedure: ROBOTIC ASSISTED LAPAROSCOPIC RADICAL PROSTATECTOMY WITH INDOCYANINE GREEN DYE;  Surgeon: Alexis Frock, MD;  Location: [facility];  Service: Urology;  Laterality: N/A;  . Lymphadenectomy Bilateral 02/27/2014    Procedure: BILATERAL LYMPHADENECTOMY;  Surgeon: Alexis Frock, MD;  Location: [facility];  Service: Urology;  Laterality: Bilateral;   Family History  Problem Relation Age of Onset  . Heart  disease Mother   . Cancer Sister     breast   Social History  Substance Use Topics  . Smoking status: Current Every Day Smoker -- 0.50 packs/day for 40 years    Types: Cigarettes  . Smokeless tobacco: Never Used  . Alcohol Use: Yes     Comment: binge drinks straight liquor    Review of Systems  Constitutional: Negative.   HENT: Positive for ear pain. Negative for ear discharge and hearing loss.   All other systems reviewed and are negative.   Allergies  Penicillins  Home Medications   Prior to Admission medications   Medication Sig Start Date End Date Taking? Authorizing Provider  aspirin EC 81 MG tablet Take 81 mg by mouth daily.    Historical Provider, MD  buPROPion (WELLBUTRIN SR) 150 MG 12 hr tablet Take 150 mg by mouth daily.     Historical Provider, MD  busPIRone (BUSPAR) 5 MG tablet Take 5 mg by mouth daily. 02/14/15   Historical Provider, MD  doxycycline (VIBRAMYCIN) 100 MG capsule Take 1 capsule (100 mg total) by mouth 2 (two) times daily. 02/18/15   Ezequiel Essex, MD  gabapentin (NEURONTIN) 300 MG capsule Take 300 mg by mouth 2 (two) times daily.     Historical Provider, MD  glimepiride (AMARYL) 4 MG tablet Take 4 mg by mouth daily with breakfast.    Historical Provider, MD  hydrOXYzine (ATARAX/VISTARIL) 50 MG tablet Take 50 mg by mouth daily.  Historical Provider, MD  ibuprofen (ADVIL,MOTRIN) 800 MG tablet Take 1 tablet (800 mg total) by mouth 3 (three) times daily. Patient taking differently: Take 800 mg by mouth every 8 (eight) hours as needed for moderate pain.  01/27/15   Charlann Lange, PA-C  methocarbamol (ROBAXIN) 500 MG tablet Take 1 tablet (500 mg total) by mouth 2 (two) times daily. 01/27/15   Charlann Lange, PA-C  Naphazoline-Glycerin (CLEAR EYES MAX REDNESS RELIEF OP) Apply 2 drops to eye daily as needed (red eyes).    Historical Provider, MD  omeprazole (PRILOSEC) 20 MG capsule Take 20 mg by mouth 2 (two) times daily. 02/06/15   Historical Provider, MD    Meds Ordered and Administered this Visit  Medications - No data to display  BP 161/92 mmHg  Pulse 95  Temp(Src) 98.2 F (36.8 C) (Oral)  Resp 16  SpO2 99% No data found.   Physical Exam  Constitutional: He is oriented to person, place, and time. He appears well-developed and well-nourished.  HENT:  Head: Normocephalic.  Right Ear: External ear normal.  Left Ear: External ear normal.  Mouth/Throat: Oropharynx is clear and moist.  No fb visualized, canal clean and wnl.  Neck: Normal range of motion. Neck supple.  Lymphadenopathy:    He has no cervical adenopathy.  Neurological: He is alert and oriented to person, place, and time.  Skin: Skin is warm and dry.  Nursing note and vitals reviewed.   ED Course  Procedures (including critical care time)  Labs Review Labs Reviewed - No data to display  Imaging Review No results found.   Visual Acuity Review  Right Eye Distance:   Left Eye Distance:   Bilateral Distance:    Right Eye Near:   Left Eye Near:    Bilateral Near:         MDM   1. Otalgia of right ear    D/c discussed and provided to pt, understands.   Billy Fischer, MD 05/21/15 [phone]

## 2015-05-21 NOTE — ED Notes (Signed)
Pt  Reports     Poss  fb  r  Ear       Sensation of fullness        Pt  Appears  In no  Distress

## 2015-05-21 NOTE — Discharge Instructions (Signed)
Return as needed

## 2015-11-10 ENCOUNTER — Encounter (HOSPITAL_COMMUNITY): Payer: Self-pay | Admitting: Emergency Medicine

## 2015-11-10 ENCOUNTER — Emergency Department (HOSPITAL_COMMUNITY)
Admission: EM | Admit: 2015-11-10 | Discharge: 2015-11-11 | Disposition: A | Discharge status: Home / Self Care | Payer: No Typology Code available for payment source | Attending: Emergency Medicine | Admitting: Emergency Medicine

## 2015-11-10 DIAGNOSIS — F329 Major depressive disorder, single episode, unspecified: Secondary | ICD-10-CM | POA: Insufficient documentation

## 2015-11-10 DIAGNOSIS — Z7982 Long term (current) use of aspirin: Secondary | ICD-10-CM | POA: Insufficient documentation

## 2015-11-10 DIAGNOSIS — F17210 Nicotine dependence, cigarettes, uncomplicated: Secondary | ICD-10-CM | POA: Diagnosis not present

## 2015-11-10 DIAGNOSIS — Z7984 Long term (current) use of oral hypoglycemic drugs: Secondary | ICD-10-CM | POA: Diagnosis not present

## 2015-11-10 DIAGNOSIS — I10 Essential (primary) hypertension: Secondary | ICD-10-CM | POA: Diagnosis not present

## 2015-11-10 DIAGNOSIS — Z8546 Personal history of malignant neoplasm of prostate: Secondary | ICD-10-CM | POA: Insufficient documentation

## 2015-11-10 DIAGNOSIS — E119 Type 2 diabetes mellitus without complications: Secondary | ICD-10-CM | POA: Insufficient documentation

## 2015-11-10 DIAGNOSIS — S199XXA Unspecified injury of neck, initial encounter: Secondary | ICD-10-CM | POA: Diagnosis present

## 2015-11-10 DIAGNOSIS — S161XXA Strain of muscle, fascia and tendon at neck level, initial encounter: Secondary | ICD-10-CM | POA: Insufficient documentation

## 2015-11-10 DIAGNOSIS — Y939 Activity, unspecified: Secondary | ICD-10-CM | POA: Insufficient documentation

## 2015-11-10 DIAGNOSIS — Y999 Unspecified external cause status: Secondary | ICD-10-CM | POA: Insufficient documentation

## 2015-11-10 DIAGNOSIS — S39012A Strain of muscle, fascia and tendon of lower back, initial encounter: Secondary | ICD-10-CM

## 2015-11-10 DIAGNOSIS — Y92410 Unspecified street and highway as the place of occurrence of the external cause: Secondary | ICD-10-CM | POA: Diagnosis not present
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
## 2015-11-11 ENCOUNTER — Emergency Department (HOSPITAL_COMMUNITY): Payer: No Typology Code available for payment source

## 2015-11-11 MED ORDER — IBUPROFEN 800 MG PO TABS: 800.0000 mg | ORAL_TABLET | Freq: Three times a day (TID) | ORAL | 0 refills | Status: DC | PRN
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
## 2015-11-11 NOTE — ED Notes (Signed)
Patient transported to X-ray 

## 2016-05-24 ENCOUNTER — Encounter: Payer: Self-pay | Admitting: Gastroenterology

## 2016-07-05 ENCOUNTER — Ambulatory Visit (AMBULATORY_SURGERY_CENTER): Payer: Self-pay

## 2016-07-05 VITALS — Ht 71.0 in | Wt 180.8 lb

## 2016-07-05 DIAGNOSIS — Z1211 Encounter for screening for malignant neoplasm of colon: Secondary | ICD-10-CM

## 2016-07-05 MED ORDER — NA SULFATE-K SULFATE-MG SULF 17.5-3.13-1.6 GM/177ML PO SOLN: 1.0000 | Freq: Once | ORAL | 0 refills | Status: AC

## 2016-07-05 NOTE — Progress Notes (Signed)
Denies allergies to eggs or soy products. Denies complication of anesthesia or sedation. Denies use of weight loss medication. Denies use of O2.   Emmi instructions declined. No home computer. 

## 2016-07-19 ENCOUNTER — Encounter: Payer: Self-pay | Admitting: Gastroenterology

## 2016-07-19 ENCOUNTER — Ambulatory Visit (AMBULATORY_SURGERY_CENTER): Payer: Medicare Other | Admitting: Gastroenterology

## 2016-07-19 VITALS — BP 156/88 | HR 85 | Temp 97.7°F | Resp 19 | Ht 71.0 in | Wt 180.0 lb

## 2016-07-19 DIAGNOSIS — D120 Benign neoplasm of cecum: Secondary | ICD-10-CM | POA: Diagnosis not present

## 2016-07-19 DIAGNOSIS — Z1212 Encounter for screening for malignant neoplasm of rectum: Secondary | ICD-10-CM

## 2016-07-19 DIAGNOSIS — D128 Benign neoplasm of rectum: Secondary | ICD-10-CM

## 2016-07-19 DIAGNOSIS — D122 Benign neoplasm of ascending colon: Secondary | ICD-10-CM

## 2016-07-19 DIAGNOSIS — D129 Benign neoplasm of anus and anal canal: Secondary | ICD-10-CM

## 2016-07-19 DIAGNOSIS — Z1211 Encounter for screening for malignant neoplasm of colon: Secondary | ICD-10-CM

## 2016-07-19 DIAGNOSIS — K621 Rectal polyp: Secondary | ICD-10-CM

## 2016-07-19 DIAGNOSIS — D125 Benign neoplasm of sigmoid colon: Secondary | ICD-10-CM

## 2016-07-19 DIAGNOSIS — K635 Polyp of colon: Secondary | ICD-10-CM

## 2016-07-19 DIAGNOSIS — D124 Benign neoplasm of descending colon: Secondary | ICD-10-CM

## 2016-07-19 DIAGNOSIS — D123 Benign neoplasm of transverse colon: Secondary | ICD-10-CM

## 2016-07-19 MED ORDER — SODIUM CHLORIDE 0.9 % IV SOLN: 500.0000 mL | INTRAVENOUS | Status: DC

## 2016-07-19 NOTE — Progress Notes (Signed)
Called to room to assist during endoscopic procedure.  Patient ID and intended procedure confirmed with present staff. Received instructions for my participation in the procedure from the performing physician.  

## 2016-07-19 NOTE — Progress Notes (Signed)
Pt's states no medical or surgical changes since previsit or office visit. 

## 2016-07-19 NOTE — Progress Notes (Signed)
A/ox3, pleased with MAC, report to RN 

## 2016-07-19 NOTE — Patient Instructions (Signed)
YOU HAD AN ENDOSCOPIC PROCEDURE TODAY AT [facility]:   Refer to the procedure report that was given to you for any specific questions about what was found during the examination.  If the procedure report does not answer your questions, please call your gastroenterologist to clarify.  If you requested that your care partner not be given the details of your procedure findings, then the procedure report has been included in a sealed envelope for you to review at your convenience later.  YOU SHOULD EXPECT: Some feelings of bloating in the abdomen. Passage of more gas than usual.  Walking can help get rid of the air that was put into your GI tract during the procedure and reduce the bloating. If you had a lower endoscopy (such as a colonoscopy or flexible sigmoidoscopy) you may notice spotting of blood in your stool or on the toilet paper. If you underwent a bowel prep for your procedure, you may not have a normal bowel movement for a few days.  Please Note:  You might notice some irritation and congestion in your nose or some drainage.  This is from the oxygen used during your procedure.  There is no need for concern and it should clear up in a day or so.  SYMPTOMS TO REPORT IMMEDIATELY:   Following lower endoscopy (colonoscopy or flexible sigmoidoscopy):  Excessive amounts of blood in the stool  Significant tenderness or worsening of abdominal pains  Swelling of the abdomen that is new, acute  Fever of 100F or higher    For urgent or emergent issues, a gastroenterologist can be reached at any hour by calling [phone].   DIET:  We do recommend a small meal at first, but then you may proceed to your regular diet.  Drink plenty of fluids but you should avoid alcoholic beverages for 24 hours.  ACTIVITY:  You should plan to take it easy for the rest of today and you should NOT DRIVE or use heavy machinery until tomorrow (because of the sedation medicines used during the test).     FOLLOW UP: Our staff will call the number listed on your records the next business day following your procedure to check on you and address any questions or concerns that you may have regarding the information given to you following your procedure. If we do not reach you, we will leave a message.  However, if you are feeling well and you are not experiencing any problems, there is no need to return our call.  We will assume that you have returned to your regular daily activities without incident.  If any biopsies were taken you will be contacted by phone or by letter within the next 1-3 weeks.  Please call us at [phone] if you have not heard about the biopsies in 3 weeks.    SIGNATURES/CONFIDENTIALITY: You and/or your care partner have signed paperwork which will be entered into your electronic medical record.  These signatures attest to the fact that that the information above on your After Visit Summary has been reviewed and is understood.  Full responsibility of the confidentiality of this discharge information lies with you and/or your care-partner.   NO IBUPROFEN ,NAPROXEN,OR OTHER NON STEROIDAL ANTI INFLAMMATORY PRODUCTS FOR 2 WEEKS AFTER POLYP REMPVAL  AWAIT PATHOLOGY RESULTS

## 2016-07-19 NOTE — Op Note (Signed)
[facility] Patient Name: Gregory Nunez Procedure Date: 07/19/2016 8:24 AM MRN: [id number] Endoscopist: Remo Lipps P. Armbruster MD, MD Age: 71 Referring MD:  Date of Birth: 1945/08/25 Gender: Male Account #: [id number] Procedure:                Colonoscopy Indications:              Screening for colorectal malignant neoplasm, This                            is the patient's first colonoscopy Medicines:                Monitored Anesthesia Care Procedure:                Pre-Anesthesia Assessment:                           - Prior to the procedure, a History and Physical                            was performed, and patient medications and                            allergies were reviewed. The patient's tolerance of                            previous anesthesia was also reviewed. The risks                            and benefits of the procedure and the sedation                            options and risks were discussed with the patient.                            All questions were answered, and informed consent                            was obtained. Prior Anticoagulants: The patient has                            taken aspirin, last dose was 1 day prior to                            procedure. ASA Grade Assessment: II - A patient                            with mild systemic disease. After reviewing the                            risks and benefits, the patient was deemed in                            satisfactory condition to undergo the procedure.  After obtaining informed consent, the colonoscope                            was passed under direct vision. Throughout the                            procedure, the patient's blood pressure, pulse, and                            oxygen saturations were monitored continuously. The                            Colonoscope was introduced through the anus and                            advanced to the the  terminal ileum, with                            identification of the appendiceal orifice and IC                            valve. The colonoscopy was performed without                            difficulty. The patient tolerated the procedure                            well. The quality of the bowel preparation was                            good. The terminal ileum, ileocecal valve,                            appendiceal orifice, and rectum were photographed. Scope In: 8:34:38 AM Scope Out: 9:05:56 AM Scope Withdrawal Time: 0 hours 27 minutes 19 seconds  Total Procedure Duration: 0 hours 31 minutes 18 seconds  Findings:                 The perianal and digital rectal examinations were                            normal.                           Four sessile polyps were found in the ascending                            colon. The polyps were 4 to 5 mm in size. These                            polyps were removed with a cold snare. Resection                            and retrieval were complete.  Nine sessile polyps were found in the transverse                            colon. The polyps were 4 to 5 mm in size. These                            polyps were removed with a cold snare. Resection                            and retrieval were complete.                           Two sessile polyps were found in the descending                            colon. The polyps were 3 to 6 mm in size. These                            polyps were removed with a cold snare. Resection                            and retrieval were complete.                           A 5 mm polyp was found in the sigmoid colon. The                            polyp was sessile. The polyp was removed with a                            cold snare. Resection and retrieval were complete.                           Two sessile polyps were found in the rectum. The                            polyps were 3  to 4 mm in size. These polyps were                            removed with a cold snare. Resection and retrieval                            were complete.                           There was a medium-sized lipoma, in the ascending                            colon.                           Internal hemorrhoids were found during  retroflexion. The hemorrhoids were moderate.                           The terminal ileum appeared normal.                           The exam was otherwise without abnormality. Complications:            No immediate complications. Estimated blood loss:                            Minimal. Estimated Blood Loss:     Estimated blood loss was minimal. Impression:               - Four 4 to 5 mm polyps in the ascending colon,                            removed with a cold snare. Resected and retrieved.                           - Nine 4 to 5 mm polyps in the transverse colon,                            removed with a cold snare. Resected and retrieved.                           - Two 3 to 6 mm polyps in the descending colon,                            removed with a cold snare. Resected and retrieved.                           - One 5 mm polyp in the sigmoid colon, removed with                            a cold snare. Resected and retrieved.                           - Two 3 to 4 mm polyps in the rectum, removed with                            a cold snare. Resected and retrieved.                           - Medium-sized lipoma in the ascending colon.                           - Internal hemorrhoids.                           - The examined portion of the ileum was normal.                           - The examination was otherwise  normal. Recommendation:           - Patient has a contact number available for                            emergencies. The signs and symptoms of potential                            delayed complications were discussed  with the                            patient. Return to normal activities tomorrow.                            Written discharge instructions were provided to the                            patient.                           - Resume previous diet.                           - Continue present medications.                           - No ibuprofen, naproxen, or other non-steroidal                            anti-inflammatory drugs for 2 weeks after polyp                            removal.                           - Await pathology results.                           - Repeat colonoscopy is recommended for                            surveillance. The colonoscopy date will be                            determined after pathology results from today's                            exam become available for review. Remo Lipps P. Armbruster MD, MD 07/19/2016 9:12:11 AM This report has been signed electronically.

## 2016-07-20 ENCOUNTER — Telehealth: Payer: Self-pay | Admitting: *Deleted

## 2016-07-20 NOTE — Telephone Encounter (Signed)
  Follow up Call-  Call back number 07/19/2016  Post procedure Call Back phone  # [phone]  Permission to leave phone message Yes  Some recent data might be hidden    [facility]

## 2016-07-20 NOTE — Telephone Encounter (Signed)
  Follow up Call-  Call back number 07/19/2016  Post procedure Call Back phone  # [phone]  Permission to leave phone message Yes  Some recent data might be hidden     Patient questions:  Do you have a fever, pain , or abdominal swelling? NO Pain Score  0 *  Have you tolerated food without any problems? Yes.    Have you been able to return to your normal activities? Yes.    Do you have any questions about your discharge instructions: Diet   No. Medications  No. Follow up visit  No.  Do you have questions or concerns about your Care? No.  Actions: * If pain score is 4 or above: No action needed, pain <4.

## 2016-11-30 ENCOUNTER — Ambulatory Visit (INDEPENDENT_AMBULATORY_CARE_PROVIDER_SITE_OTHER): Payer: Medicare Other | Admitting: Sports Medicine

## 2016-11-30 ENCOUNTER — Encounter: Payer: Self-pay | Admitting: Sports Medicine

## 2016-11-30 ENCOUNTER — Ambulatory Visit (INDEPENDENT_AMBULATORY_CARE_PROVIDER_SITE_OTHER): Payer: Medicare Other

## 2016-11-30 VITALS — BP 158/81 | HR 93 | Resp 16

## 2016-11-30 DIAGNOSIS — M79672 Pain in left foot: Secondary | ICD-10-CM

## 2016-11-30 DIAGNOSIS — L84 Corns and callosities: Secondary | ICD-10-CM | POA: Diagnosis not present

## 2016-11-30 DIAGNOSIS — E1140 Type 2 diabetes mellitus with diabetic neuropathy, unspecified: Secondary | ICD-10-CM | POA: Diagnosis not present

## 2016-11-30 NOTE — Progress Notes (Signed)
Subjective: Gregory Nunez is a 71 y.o. male patient with history of diabetes who presents to office today complaining of painful callus while ambulating in shoes; unable to trim. Patient states that the glucose reading this morning was not recorded. Patient denies any new changes in medication or new problems. Patient admits to, numbness, burning or tingling in left foot that radiates up the leg and wanted to have it checked. Admits to a history of previous foot surgery.  Patient Active Problem List   Diagnosis Date Noted  . Prostate cancer ([place]) 02/27/2014  . Malignant neoplasm of prostate ([place]) 01/09/2014  . Bunion 01/17/2013  . 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 aspirin EC 81 MG tablet Take 81 mg by mouth daily.    Marland Kitchen buPROPion (WELLBUTRIN SR) 150 MG 12 hr tablet Take 150 mg by mouth daily.     . busPIRone (BUSPAR) 5 MG tablet Take 5 mg by mouth daily.    Marland Kitchen gabapentin (NEURONTIN) 300 MG capsule Take 300 mg by mouth 2 (two) times daily.     Marland Kitchen glimepiride (AMARYL) 4 MG tablet Take 4 mg by mouth daily with breakfast.    . ibuprofen (ADVIL,MOTRIN) 800 MG tablet Take 1 tablet (800 mg total) by mouth every 8 (eight) hours as needed for moderate pain. 20 tablet 0  . omeprazole (PRILOSEC) 20 MG capsule Take 20 mg by mouth 2 (two) times daily.  2   Current Facility-Administered Medications on File Prior to Visit  Medication Dose Route Frequency Provider Last Rate Last Dose  . 0.9 %  sodium chloride infusion  500 mL Intravenous Continuous Armbruster, Renelda Loma, MD       Allergies  Allergen Reactions  . Penicillins Anxiety    Has patient had a PCN reaction causing immediate rash, facial/tongue/throat swelling, SOB or lightheadedness with hypotension:No Has patient had a PCN reaction causing severe rash involving mucus membranes or skin necrosis:No Has patient had a PCN reaction that required  hospitalization: No Has patient had a PCN reaction occurring within the last 10 years: No      No results found for this or any previous visit (from the past 2160 hour(s)).  Objective: General: Patient is awake, alert, and oriented x 3 and in no acute distress.  Integument: Skin is warm, dry and supple bilateral. Nails are short and mildly, thickened and  dystrophic with subungual debris, consistent with onychomycosis, 1-5 bilateral. No signs of infection. Callus sub-met 5 bilateral and medial second toe on right with no signs of infection. Remaining integument unremarkable.  Vasculature:  Dorsalis Pedis pulse 2/4 bilateral. Posterior Tibial pulse  2/4 bilateral.  Capillary fill time <3 sec 1-5 bilateral. Positive hair growth to the level of the digits. Temperature gradient within normal limits. No varicosities present bilateral. No edema present bilateral.   Neurology: The patient has diminished sensation measured with a 5.07/10g Semmes Weinstein Monofilament at all pedal sites bilateral . Vibratory sensation diminished bilateral with tuning fork. No Babinski sign present bilateral.   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 Muscular strength 5/5 in all lower extremity muscular groups bilateral without pain on range of motion . No tenderness with calf compression bilateral.  Assessment and Plan: Problem List Items Addressed This Visit    None    Visit Diagnoses    Corns and callosities    -  Primary   Foot pain, left  Relevant Orders   DG Foot Complete Left   Type 2 diabetes, controlled, with neuropathy ([place])          -Examined patient. -Discussed and educated patient on diabetic foot care, especially with  regards to the vascular, neurological and musculoskeletal systems.  -Stressed the importance of good glycemic control and the detriment of not  controlling glucose levels in relation to the foot. -Mechanically debridedCallus 3 using  sterile chisel blade without incident  -Dispensed toe spacer for right foot -Recommend over-the-counter Aspercreme for likely neuropathy pain, worse on left. Advised patient that if continues to persist, we will consult neurology for further recommendation -Safe step diabetic shoe order form was completed; office to contact primary care for approval / certification;  Office to arrange shoe fitting and dispensing. -Answered all patient questions -Patient to return  in 3 months for at risk foot care -Patient advised to call the office if any problems or questions arise in the meantime.  Landis Martins, DPM

## 2016-11-30 NOTE — Patient Instructions (Signed)
[place]

## 2017-03-08 ENCOUNTER — Encounter: Payer: Self-pay | Admitting: Sports Medicine

## 2017-03-08 ENCOUNTER — Ambulatory Visit (INDEPENDENT_AMBULATORY_CARE_PROVIDER_SITE_OTHER): Payer: Medicare Other | Admitting: Sports Medicine

## 2017-03-08 DIAGNOSIS — E1140 Type 2 diabetes mellitus with diabetic neuropathy, unspecified: Secondary | ICD-10-CM

## 2017-03-08 DIAGNOSIS — M79671 Pain in right foot: Secondary | ICD-10-CM | POA: Diagnosis not present

## 2017-03-08 DIAGNOSIS — M79672 Pain in left foot: Secondary | ICD-10-CM

## 2017-03-08 DIAGNOSIS — L84 Corns and callosities: Secondary | ICD-10-CM

## 2017-03-08 NOTE — Progress Notes (Signed)
Subjective: Gregory Nunez is a 71 y.o. male patient with history of diabetes who presents to office today complaining of painful callus while ambulating in shoes; unable to trim. Patient states that the glucose reading this morning was not recorded, "Dont have meter". Patient denies any new changes in medication or new problems.   Patient Active Problem List   Diagnosis Date Noted  . Prostate cancer ([place]) 02/27/2014  . Malignant neoplasm of prostate ([place]) 01/09/2014  . Bunion 01/17/2013  . 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 aspirin EC 81 MG tablet Take 81 mg by mouth daily.    Marland Kitchen buPROPion (WELLBUTRIN SR) 150 MG 12 hr tablet Take 150 mg by mouth daily.     . busPIRone (BUSPAR) 5 MG tablet Take 5 mg by mouth daily.    Marland Kitchen gabapentin (NEURONTIN) 300 MG capsule Take 300 mg by mouth 2 (two) times daily.     Marland Kitchen glimepiride (AMARYL) 4 MG tablet Take 4 mg by mouth daily with breakfast.    . ibuprofen (ADVIL,MOTRIN) 800 MG tablet Take 1 tablet (800 mg total) by mouth every 8 (eight) hours as needed for moderate pain. 20 tablet 0  . omeprazole (PRILOSEC) 20 MG capsule Take 20 mg by mouth 2 (two) times daily.  2   Current Facility-Administered Medications on File Prior to Visit  Medication Dose Route Frequency Provider Last Rate Last Dose  . 0.9 %  sodium chloride infusion  500 mL Intravenous Continuous Armbruster, Carlota Raspberry, MD       Allergies  Allergen Reactions  . Penicillins Anxiety    Has patient had a PCN reaction causing immediate rash, facial/tongue/throat swelling, SOB or lightheadedness with hypotension:No Has patient had a PCN reaction causing severe rash involving mucus membranes or skin necrosis:No Has patient had a PCN reaction that required hospitalization: No Has patient had a PCN reaction occurring within the last 10 years: No      No results found for this or any previous visit  (from the past 2160 hour(s)).  Objective: General: Patient is awake, alert, and oriented x 3 and in no acute distress.  Integument: Skin is warm, dry and supple bilateral. Nails are short and mildly, thickened and  dystrophic with subungual debris, consistent with onychomycosis, 1-5 bilateral. No signs of infection. Callus sub-met 5 bilateral and medial second toe on right with no signs of infection. Remaining integument unremarkable.  Vasculature:  Dorsalis Pedis pulse 2/4 bilateral. Posterior Tibial pulse  2/4 bilateral.  Capillary fill time <3 sec 1-5 bilateral. Positive hair growth to the level of the digits. Temperature gradient within normal limits. No varicosities present bilateral. No edema present bilateral.   Neurology: The patient has diminished sensation measured with a 5.07/10g Semmes Weinstein Monofilament at all pedal sites bilateral . Vibratory sensation diminished bilateral with tuning fork. No Babinski sign present bilateral.   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 Muscular strength 5/5 in all lower extremity muscular groups bilateral without pain on range of motion . No tenderness with calf compression bilateral.  Assessment and Plan: Problem List Items Addressed This Visit    None    Visit Diagnoses    Corns and callosities    -  Primary   Type 2 diabetes, controlled, with neuropathy (HCC)       Foot pain, bilateral         -Examined patient. -Discussed and educated patient  on diabetic foot care, especially with  regards to the vascular, neurological and musculoskeletal systems.  -Stressed the importance of good glycemic control and the detriment of not  controlling glucose levels in relation to the foot. -Mechanically debridedCallus 3 using sterile chisel blade without incident  -Continue with toe spacer for right foot -Continue with over-the-counter Aspercreme for nerve pain and advised patient to discuss with PCP for possible  change to Gabapentin medication  -Awaiting Diabetic shoes  -Answered all patient questions -Patient to return  in 3 months for at risk foot care -Patient advised to call the office if any problems or questions arise in the meantime.  Landis Martins, DPM

## 2017-04-15 ENCOUNTER — Encounter (HOSPITAL_COMMUNITY): Payer: Self-pay | Admitting: Family Medicine

## 2017-04-15 ENCOUNTER — Ambulatory Visit (INDEPENDENT_AMBULATORY_CARE_PROVIDER_SITE_OTHER): Payer: Medicare Other

## 2017-04-15 ENCOUNTER — Ambulatory Visit (HOSPITAL_COMMUNITY)
Admission: EM | Admit: 2017-04-15 | Discharge: 2017-04-15 | Disposition: A | Discharge status: Home / Self Care | Payer: Medicare Other | Attending: Internal Medicine | Admitting: Internal Medicine

## 2017-04-15 DIAGNOSIS — R05 Cough: Secondary | ICD-10-CM

## 2017-04-15 DIAGNOSIS — J069 Acute upper respiratory infection, unspecified: Secondary | ICD-10-CM | POA: Diagnosis not present

## 2017-04-15 MED ORDER — AZITHROMYCIN 250 MG PO TABS: 250.0000 mg | ORAL_TABLET | Freq: Every day | ORAL | 0 refills | Status: DC

## 2017-04-15 MED ORDER — BENZONATATE 100 MG PO CAPS: 100.0000 mg | ORAL_CAPSULE | Freq: Three times a day (TID) | ORAL | 0 refills | Status: DC

## 2017-04-15 NOTE — ED Provider Notes (Signed)
[facility]    CSN: [id number] Arrival date & time: 04/15/17  1246     History   Chief Complaint Chief Complaint  Patient presents with  . URI    HPI Gregory Nunez is a 71 y.o. male.   71 year old male, with history of hypertension, diabetes, hyperlipidemia, presenting today complaining of upper respiratory symptoms.  Patient states that he has had nasal congestion, mild headache and nonproductive cough over the past 2-3 days.  He has had no fever or chills.  Denies any chest pain, shortness of breath, neck pain or stiffness.  Denies any known recent sick contacts.  Has been using Mucinex at home without much relief.   The history is provided by the patient.  URI  Presenting symptoms: congestion, cough and rhinorrhea   Presenting symptoms: no ear pain, no fever and no sore throat   Severity:  Mild Onset quality:  Gradual Duration:  3 days Timing:  Constant Progression:  Unchanged Chronicity:  New Relieved by:  Nothing Worsened by:  Nothing Ineffective treatments:  OTC medications Associated symptoms: headaches and sneezing   Associated symptoms: no arthralgias, no myalgias, no neck pain, no sinus pain, no swollen glands and no wheezing   Risk factors: not elderly, no chronic cardiac disease, no chronic kidney disease, no chronic respiratory disease, no diabetes mellitus and no sick contacts     Past Medical History:  Diagnosis Date  . Depression   . Diabetes mellitus without complication ([place])   . GERD (gastroesophageal reflux disease)    if drinks alcohol  . 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 Hyperlipidemia   . Hypertension   . Malignant neoplasm of prostate ([place]) 01/09/2014  . Pancreatitis   . Prostate cancer ([place]) 12/19/13   Gleason 4+3=7, volume 31.31 cc  . Shortness of breath dyspnea    with exertion     Patient Active Problem List   Diagnosis Date Noted  .  Prostate cancer ([place]) 02/27/2014  . Malignant neoplasm of prostate ([place]) 01/09/2014  . Bunion 01/17/2013  . 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 biopsy on throat     hx of   . FOOT SURGERY    . LYMPHADENECTOMY Bilateral 02/27/2014   Procedure: BILATERAL LYMPHADENECTOMY;  Surgeon: Alexis Frock, MD;  Location: [facility];  Service: Urology;  Laterality: Bilateral;  . PROSTATE BIOPSY  12/2013   Gleason 4+3=7, volume 31.31 cc  . ROBOT ASSISTED LAPAROSCOPIC RADICAL PROSTATECTOMY N/A 02/27/2014   Procedure: ROBOTIC ASSISTED LAPAROSCOPIC RADICAL PROSTATECTOMY WITH INDOCYANINE GREEN DYE;  Surgeon: Alexis Frock, MD;  Location: [facility];  Service: Urology;  Laterality: N/A;       Home Medications    Prior to Admission medications   Medication Sig Start Date End Date Taking? Authorizing Provider  losartan (COZAAR) 100 MG tablet Take 25 mg by mouth daily.   Yes [provider]  aspirin EC 81 MG tablet Take 81 mg by mouth daily.    [provider]  azithromycin (ZITHROMAX) 250 MG tablet Take 1 tablet (250 mg total) by mouth daily. Take first 2 tablets together, then 1 every day until finished. 04/18/17   Blue, Olivia C, PA-C  benzonatate (TESSALON) 100 MG capsule Take 1 capsule (100 mg total) by mouth every 8 (eight) hours. 04/15/17   Blue, Olivia C, PA-C  buPROPion (WELLBUTRIN SR) 150 MG 12 hr  tablet Take 150 mg by mouth daily.     [provider]  busPIRone (BUSPAR) 5 MG tablet Take 5 mg by mouth daily. 02/14/15   [provider]  gabapentin (NEURONTIN) 300 MG capsule Take 300 mg by mouth 2 (two) times daily.     [provider]  glimepiride (AMARYL) 4 MG tablet Take 4 mg by mouth daily with breakfast.    [provider]  ibuprofen (ADVIL,MOTRIN) 800 MG tablet Take 1 tablet (800 mg total) by mouth every 8 (eight) hours as needed for moderate pain. 11/11/15   Molpus, John, MD  omeprazole  (PRILOSEC) 20 MG capsule Take 20 mg by mouth 2 (two) times daily. 02/06/15   [provider]    Family History Family History  Problem Relation Age of Onset  . Heart disease Mother   . Cancer Sister        breast  . Colon cancer Neg Hx   . Esophageal cancer Neg Hx   . Rectal cancer Neg Hx   . Stomach cancer Neg Hx     Social History Social History   Tobacco Use  . Smoking status: Current Every Day Smoker    Packs/day: 0.50    Years: 40.00    Pack years: 20.00    Types: Cigarettes  . Smokeless tobacco: Never Used  Substance Use Topics  . Alcohol use: No    Comment: Patient states no alcohol in 2 years.   . Drug use: Yes    Types: Marijuana, Cocaine    Comment: past hx approx 30 years ago      Allergies   Penicillins   Review of Systems Review of Systems  Constitutional: Negative for chills and fever.  HENT: Positive for congestion, rhinorrhea and sneezing. Negative for ear pain, sinus pain and sore throat.   Eyes: Negative for pain and visual disturbance.  Respiratory: Positive for cough. Negative for shortness of breath and wheezing.   Cardiovascular: Negative for chest pain and palpitations.  Gastrointestinal: Negative for abdominal pain and vomiting.  Genitourinary: Negative for dysuria and hematuria.  Musculoskeletal: Negative for arthralgias, back pain, myalgias and neck pain.  Skin: Negative for color change and rash.  Neurological: Positive for headaches. Negative for seizures and syncope.  All other systems reviewed and are negative.    Physical Exam Triage Vital Signs ED Triage Vitals  Enc Vitals Group     BP 04/15/17 1318 (!) 180/97     Pulse Rate 04/15/17 1318 90     Resp 04/15/17 1318 18     Temp 04/15/17 1318 98.3 F (36.8 C)     Temp src --      SpO2 04/15/17 1318 100 %     Weight --      Height --      Head Circumference --      Peak Flow --      Pain Score 04/15/17 1316 5     Pain Loc --      Pain Edu? --      Excl. in  [place]? --    No data found.  Updated Vital Signs BP (!) 180/97   Pulse 90   Temp 98.3 F (36.8 C)   Resp 18   SpO2 100%   Visual Acuity Right Eye Distance:   Left Eye Distance:   Bilateral Distance:    Right Eye Near:   Left Eye Near:    Bilateral Near:     Physical Exam  Constitutional:  He appears well-developed and well-nourished.  HENT:  Head: Normocephalic and atraumatic.  Right Ear: Hearing, tympanic membrane, external ear and ear canal normal.  Left Ear: Hearing, tympanic membrane, external ear and ear canal normal.  Nose: Nose normal.  Mouth/Throat: Uvula is midline and oropharynx is clear and moist. No oropharyngeal exudate, posterior oropharyngeal edema, posterior oropharyngeal erythema or tonsillar abscesses.  Eyes: Conjunctivae are normal.  Neck: Neck supple.  Cardiovascular: Normal rate and regular rhythm.  No murmur heard. Pulmonary/Chest: Effort normal and breath sounds normal. No stridor. No respiratory distress. He has no decreased breath sounds. He has no wheezes. He has no rhonchi. He has no rales.  Abdominal: Soft. There is no tenderness.  Musculoskeletal: He exhibits no edema.  Neurological: He is alert.  Skin: Skin is warm and dry.  Psychiatric: He has a normal mood and affect.  Nursing note and vitals reviewed.    UC Treatments / Results  Labs (all labs ordered are listed, but only abnormal results are displayed) Labs Reviewed - No data to display  EKG  EKG Interpretation None       Radiology Dg Chest 2 View  Result Date: 04/15/2017 CLINICAL DATA:  Cough and congestion for the past 5 days. EXAM: CHEST  2 VIEW COMPARISON:  CT chest and chest x-ray dated February 18, 2015. FINDINGS: The cardiomediastinal silhouette is normal in size. Normal pulmonary vascularity. No focal consolidation, pleural effusion, or pneumothorax. No acute osseous abnormality. IMPRESSION: No active cardiopulmonary disease. Electronically Signed   By: Titus Dubin  M.D.   On: 04/15/2017 14:02    Procedures Procedures (including critical care time)  Medications Ordered in UC Medications - No data to display   Initial Impression / Assessment and Plan / UC Course  I have reviewed the triage vital signs and the nursing notes.  Pertinent labs & imaging results that were available during my care of the patient were reviewed by me and considered in my medical decision making (see chart for details).     Normal chest x-ray without evidence of infiltrate.  Patient will be prescribed Tessalon and prescription for azithromycin sent to the pharmacy to be filled in 3 days if patient is not feeling better.  Return precautions discussed  Final Clinical Impressions(s) / UC Diagnoses   Final diagnoses:  Viral upper respiratory tract infection    ED Discharge Orders        Ordered    azithromycin (ZITHROMAX) 250 MG tablet  Daily     04/15/17 1408    benzonatate (TESSALON) 100 MG capsule  Every 8 hours     04/15/17 1408       Controlled Substance Prescriptions [place] Controlled Substance Registry consulted? Not Applicable   Phebe Colla, [place] 04/15/17 1430

## 2017-04-15 NOTE — ED Triage Notes (Signed)
Pt here for URI symptoms x 4 days. Taking mucinex OTC without relief

## 2017-06-07 ENCOUNTER — Encounter: Payer: Self-pay | Admitting: Sports Medicine

## 2017-06-07 ENCOUNTER — Ambulatory Visit (INDEPENDENT_AMBULATORY_CARE_PROVIDER_SITE_OTHER): Payer: Medicare Other | Admitting: Sports Medicine

## 2017-06-07 DIAGNOSIS — M79674 Pain in right toe(s): Secondary | ICD-10-CM | POA: Diagnosis not present

## 2017-06-07 DIAGNOSIS — B351 Tinea unguium: Secondary | ICD-10-CM

## 2017-06-07 DIAGNOSIS — E1140 Type 2 diabetes mellitus with diabetic neuropathy, unspecified: Secondary | ICD-10-CM

## 2017-06-07 DIAGNOSIS — M79675 Pain in left toe(s): Secondary | ICD-10-CM | POA: Diagnosis not present

## 2017-06-07 DIAGNOSIS — M79672 Pain in left foot: Secondary | ICD-10-CM

## 2017-06-07 DIAGNOSIS — M79671 Pain in right foot: Secondary | ICD-10-CM

## 2017-06-07 DIAGNOSIS — L84 Corns and callosities: Secondary | ICD-10-CM

## 2017-06-07 NOTE — Progress Notes (Signed)
Subjective: Gregory Nunez is a 72 y.o. male patient with history of diabetes who presents to office today complaining of painful callus and nails while ambulating in shoes; unable to trim. Patient states that the glucose reading this morning was not recorded, "Dont have meter" and will talk with PCP about it today. Patient denies any new changes in medication or new problems. Reports that he has no recurrence of cancer.   Patient Active Problem List   Diagnosis Date Noted  . Prostate cancer ([place]) 02/27/2014  . Malignant neoplasm of prostate ([place]) 01/09/2014  . Bunion 01/17/2013  . 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 aspirin EC 81 MG tablet Take 81 mg by mouth daily.    Marland Kitchen azithromycin (ZITHROMAX) 250 MG tablet Take 1 tablet (250 mg total) by mouth daily. Take first 2 tablets together, then 1 every day until finished. 6 tablet 0  . benzonatate (TESSALON) 100 MG capsule Take 1 capsule (100 mg total) by mouth every 8 (eight) hours. 21 capsule 0  . buPROPion (WELLBUTRIN SR) 150 MG 12 hr tablet Take 150 mg by mouth daily.     . busPIRone (BUSPAR) 5 MG tablet Take 5 mg by mouth daily.    Marland Kitchen gabapentin (NEURONTIN) 300 MG capsule Take 300 mg by mouth 2 (two) times daily.     Marland Kitchen glimepiride (AMARYL) 4 MG tablet Take 4 mg by mouth daily with breakfast.    . ibuprofen (ADVIL,MOTRIN) 800 MG tablet Take 1 tablet (800 mg total) by mouth every 8 (eight) hours as needed for moderate pain. 20 tablet 0  . losartan (COZAAR) 100 MG tablet Take 25 mg by mouth daily.    Marland Kitchen omeprazole (PRILOSEC) 20 MG capsule Take 20 mg by mouth 2 (two) times daily.  2   Current Facility-Administered Medications on File Prior to Visit  Medication Dose Route Frequency Provider Last Rate Last Dose  . 0.9 %  sodium chloride infusion  500 mL Intravenous Continuous Armbruster, Carlota Raspberry, MD       Allergies  Allergen Reactions  . Penicillins  Anxiety    Has patient had a PCN reaction causing immediate rash, facial/tongue/throat swelling, SOB or lightheadedness with hypotension:No Has patient had a PCN reaction causing severe rash involving mucus membranes or skin necrosis:No Has patient had a PCN reaction that required hospitalization: No Has patient had a PCN reaction occurring within the last 10 years: No      No results found for this or any previous visit (from the past 2160 hour(s)).  Objective: General: Patient is awake, alert, and oriented x 3 and in no acute distress.  Integument: Skin is warm, dry and supple bilateral. Nails are elonagted and mildly, thickened and  dystrophic with subungual debris, consistent with onychomycosis, 1-5 bilateral. No signs of infection. Callus sub-met 5 bilateral and medial second toe on right with no signs of infection. Remaining integument unremarkable.  Vasculature:  Dorsalis Pedis pulse 2/4 bilateral. Posterior Tibial pulse  2/4 bilateral.  Capillary fill time <3 sec 1-5 bilateral. Positive hair growth to the level of the digits. Temperature gradient within normal limits. No varicosities present bilateral. No edema present bilateral.   Neurology: The patient has diminished sensation measured with a 5.07/10g Semmes Weinstein Monofilament at all pedal sites bilateral . Vibratory sensation diminished bilateral with tuning fork. No Babinski sign present bilateral.   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 Muscular  strength 5/5 in all lower extremity muscular groups bilateral without pain on range of motion . No tenderness with calf compression bilateral.  Assessment and Plan: Problem List Items Addressed This Visit    None    Visit Diagnoses    Corns and callosities    -  Primary   Type 2 diabetes, controlled, with neuropathy (HCC)       Foot pain, bilateral       Pain due to onychomycosis of toenails of both feet          -Examined  patient. -Discussed and educated patient on diabetic foot care, especially with  regards to the vascular, neurological and musculoskeletal systems.  -Stressed the importance of good glycemic control and the detriment of not  controlling glucose levels in relation to the foot. -Mechanically debrided nails x 10 using sterile nail nipper and Callus 3 using sterile chisel blade without incident  -Continue with toe spacer for right foot -Continue with over-the-counter Aspercreme for nerve pain and advised patient to discuss with PCP for possible change to Gabapentin medication  -[employer] diabetic shoe order form was completed; office to contact primary care for approval / certification;  Office to arrange shoe fitting and dispensing. Dawn to contact patient after benefits are verified with insurance.  -Answered all patient questions -Patient to return  in 3 months for at risk foot care -Patient advised to call the office if any problems or questions arise in the meantime.  Landis Martins, DPM

## 2017-06-08 ENCOUNTER — Ambulatory Visit: Payer: Medicaid Other | Admitting: Orthotics

## 2017-06-08 DIAGNOSIS — M21619 Bunion of unspecified foot: Secondary | ICD-10-CM

## 2017-06-08 DIAGNOSIS — E1140 Type 2 diabetes mellitus with diabetic neuropathy, unspecified: Secondary | ICD-10-CM

## 2017-06-08 DIAGNOSIS — L84 Corns and callosities: Secondary | ICD-10-CM

## 2017-06-08 NOTE — Progress Notes (Signed)
Patient presents today for diabetic exam/measurements.  [place] patient w/ HAV, PN, and Pre-ulcerative callus sub 5 b/l.  Diabetic doctor is [place], measures 12.5 [place]annock.

## 2017-07-07 ENCOUNTER — Ambulatory Visit: Payer: Medicaid Other | Admitting: Orthotics

## 2017-07-13 ENCOUNTER — Ambulatory Visit (INDEPENDENT_AMBULATORY_CARE_PROVIDER_SITE_OTHER): Payer: Medicaid Other | Admitting: Orthotics

## 2017-07-13 DIAGNOSIS — M2040 Other hammer toe(s) (acquired), unspecified foot: Secondary | ICD-10-CM | POA: Diagnosis not present

## 2017-07-13 DIAGNOSIS — M2010 Hallux valgus (acquired), unspecified foot: Secondary | ICD-10-CM | POA: Diagnosis not present

## 2017-07-13 DIAGNOSIS — E1140 Type 2 diabetes mellitus with diabetic neuropathy, unspecified: Secondary | ICD-10-CM | POA: Diagnosis not present

## 2017-07-13 DIAGNOSIS — L84 Corns and callosities: Secondary | ICD-10-CM | POA: Diagnosis not present

## 2017-07-13 NOTE — Progress Notes (Signed)

## 2017-07-28 ENCOUNTER — Encounter: Payer: Self-pay | Admitting: Gastroenterology

## 2017-09-06 ENCOUNTER — Ambulatory Visit (INDEPENDENT_AMBULATORY_CARE_PROVIDER_SITE_OTHER): Payer: Medicare HMO | Admitting: Sports Medicine

## 2017-09-06 ENCOUNTER — Encounter: Payer: Self-pay | Admitting: Sports Medicine

## 2017-09-06 DIAGNOSIS — M21619 Bunion of unspecified foot: Secondary | ICD-10-CM

## 2017-09-06 DIAGNOSIS — M79672 Pain in left foot: Secondary | ICD-10-CM | POA: Diagnosis not present

## 2017-09-06 DIAGNOSIS — M79671 Pain in right foot: Secondary | ICD-10-CM | POA: Diagnosis not present

## 2017-09-06 DIAGNOSIS — L84 Corns and callosities: Secondary | ICD-10-CM

## 2017-09-06 DIAGNOSIS — E1140 Type 2 diabetes mellitus with diabetic neuropathy, unspecified: Secondary | ICD-10-CM | POA: Diagnosis not present

## 2017-09-06 NOTE — Progress Notes (Signed)
Subjective: Gregory Nunez is a 72 y.o. male patient with history of diabetes who presents to office today complaining of painful callus while ambulating in shoes; unable to trim. Patient states that he trimmed his nails. His glucose reading this morning was not recorded, "Dont Check"; denies any new changes in medication or new problems.  Patient Active Problem List   Diagnosis Date Noted  . Prostate cancer ([place]) 02/27/2014  . Malignant neoplasm of prostate ([place]) 01/09/2014  . Bunion 01/17/2013  . 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 aspirin EC 81 MG tablet Take 81 mg by mouth daily.    Marland Kitchen azithromycin (ZITHROMAX) 250 MG tablet Take 1 tablet (250 mg total) by mouth daily. Take first 2 tablets together, then 1 every day until finished. 6 tablet 0  . benzonatate (TESSALON) 100 MG capsule Take 1 capsule (100 mg total) by mouth every 8 (eight) hours. (Patient taking differently: Take 100 mg by mouth every 8 (eight) hours as needed. ) 21 capsule 0  . buPROPion (WELLBUTRIN SR) 150 MG 12 hr tablet Take 150 mg by mouth daily.     . busPIRone (BUSPAR) 5 MG tablet Take 5 mg by mouth daily.    Marland Kitchen gabapentin (NEURONTIN) 300 MG capsule Take 300 mg by mouth 2 (two) times daily.     Marland Kitchen glimepiride (AMARYL) 4 MG tablet Take 4 mg by mouth daily with breakfast.    . ibuprofen (ADVIL,MOTRIN) 800 MG tablet Take 1 tablet (800 mg total) by mouth every 8 (eight) hours as needed for moderate pain. 20 tablet 0  . losartan (COZAAR) 100 MG tablet Take 25 mg by mouth daily.    Marland Kitchen omeprazole (PRILOSEC) 20 MG capsule Take 20 mg by mouth 2 (two) times daily.  2   Current Facility-Administered Medications on File Prior to Visit  Medication Dose Route Frequency Provider Last Rate Last Dose  . 0.9 %  sodium chloride infusion  500 mL Intravenous Continuous Armbruster, Carlota Raspberry, MD       Allergies  Allergen Reactions  . Penicillins  Anxiety    Has patient had a PCN reaction causing immediate rash, facial/tongue/throat swelling, SOB or lightheadedness with hypotension:No Has patient had a PCN reaction causing severe rash involving mucus membranes or skin necrosis:No Has patient had a PCN reaction that required hospitalization: No Has patient had a PCN reaction occurring within the last 10 years: No      No results found for this or any previous visit (from the past 2160 hour(s)).  Objective: General: Patient is awake, alert, and oriented x 3 and in no acute distress.  Integument: Skin is warm, dry and supple bilateral. Nails are short and mildly, thickened and  dystrophic with subungual debris, consistent with onychomycosis, 1-5 bilateral. No signs of infection. Callus sub-met 5 bilateral and medial second toe on right with no signs of infection. Remaining integument unremarkable.  Vasculature:  Dorsalis Pedis pulse 2/4 bilateral. Posterior Tibial pulse  2/4 bilateral.  Capillary fill time <3 sec 1-5 bilateral. Positive hair growth to the level of the digits. Temperature gradient within normal limits. No varicosities present bilateral. No edema present bilateral.   Neurology: The patient has diminished sensation measured with a 5.07/10g Semmes Weinstein Monofilament at all pedal sites bilateral . Vibratory sensation diminished bilateral with tuning fork. No Babinski sign present bilateral.   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 Muscular strength 5/5  in all lower extremity muscular groups bilateral without pain on range of motion . No tenderness with calf compression bilateral.  Assessment and Plan: Problem List Items Addressed This Visit      Musculoskeletal and Integument   Bunion    Other Visit Diagnoses    Type 2 diabetes, controlled, with neuropathy ([place])    -  Primary   Corns and callosities       Foot pain, bilateral          -Examined patient. -Discussed and  educated patient on diabetic foot care, especially with  regards to the vascular, neurological and musculoskeletal systems.  -Stressed the importance of good glycemic control and the detriment of not  controlling glucose levels in relation to the foot. -Mechanically debrided Callus 3 using sterile chisel blade without incident  -Continue with toe spacer for right foot -Continue with diabetic shoes/insoles  -Answered all patient questions -Patient to return  in 3 months for at risk foot care -Patient advised to call the office if any problems or questions arise in the meantime.  Landis Martins, DPM

## 2017-12-13 ENCOUNTER — Ambulatory Visit: Payer: Medicare HMO | Admitting: Sports Medicine

## 2017-12-19 ENCOUNTER — Emergency Department (HOSPITAL_COMMUNITY)
Admission: EM | Admit: 2017-12-19 | Discharge: 2017-12-19 | Disposition: A | Discharge status: Home / Self Care | Payer: Medicare HMO | Attending: Emergency Medicine | Admitting: Emergency Medicine

## 2017-12-19 ENCOUNTER — Emergency Department (HOSPITAL_COMMUNITY): Payer: Medicare HMO

## 2017-12-19 ENCOUNTER — Other Ambulatory Visit: Payer: Self-pay

## 2017-12-19 ENCOUNTER — Encounter (HOSPITAL_COMMUNITY): Payer: Self-pay

## 2017-12-19 DIAGNOSIS — K859 Acute pancreatitis without necrosis or infection, unspecified: Secondary | ICD-10-CM | POA: Insufficient documentation

## 2017-12-19 DIAGNOSIS — I10 Essential (primary) hypertension: Secondary | ICD-10-CM | POA: Diagnosis not present

## 2017-12-19 DIAGNOSIS — E119 Type 2 diabetes mellitus without complications: Secondary | ICD-10-CM | POA: Diagnosis not present

## 2017-12-19 DIAGNOSIS — R079 Chest pain, unspecified: Secondary | ICD-10-CM | POA: Diagnosis present

## 2017-12-19 DIAGNOSIS — Z79899 Other long term (current) drug therapy: Secondary | ICD-10-CM | POA: Diagnosis not present

## 2017-12-19 DIAGNOSIS — F17210 Nicotine dependence, cigarettes, uncomplicated: Secondary | ICD-10-CM | POA: Insufficient documentation

## 2017-12-19 DIAGNOSIS — Z7984 Long term (current) use of oral hypoglycemic drugs: Secondary | ICD-10-CM | POA: Insufficient documentation

## 2017-12-19 DIAGNOSIS — Z7982 Long term (current) use of aspirin: Secondary | ICD-10-CM | POA: Insufficient documentation

## 2017-12-19 LAB — BASIC METABOLIC PANEL
Anion gap: 9 (ref 5–15)
BUN: 13 mg/dL (ref 8–23)
CHLORIDE: 105 mmol/L (ref 98–111)
CO2: 26 mmol/L (ref 22–32)
CREATININE: 0.93 mg/dL (ref 0.61–1.24)
Calcium: 8.6 mg/dL — ABNORMAL LOW (ref 8.9–10.3)
GFR calc Af Amer: >60 mL/min (ref >60)
GFR calc non Af Amer: >60 mL/min (ref >60)
GLUCOSE: 302 mg/dL — AB (ref 70–99)
POTASSIUM: 3.3 mmol/L — AB (ref 3.5–5.1)
Sodium: 140 mmol/L (ref 135–145)

## 2017-12-19 LAB — CBC
HEMATOCRIT: 37 % — AB (ref 39.0–52.0)
Hemoglobin: 12.8 g/dL — ABNORMAL LOW (ref 13.0–17.0)
MCH: 31.4 pg (ref 26.0–34.0)
MCHC: 34.6 g/dL (ref 30.0–36.0)
MCV: 90.7 fL (ref 78.0–100.0)
Platelets: 181 10*3/uL (ref 150–400)
RBC: 4.08 MIL/uL — ABNORMAL LOW (ref 4.22–5.81)
RDW: 11.6 % (ref 11.5–15.5)
WBC: 4.7 10*3/uL (ref 4.0–10.5)

## 2017-12-19 LAB — HEPATIC FUNCTION PANEL
ALT: 32 U/L (ref 0–44)
AST: 38 U/L (ref 15–41)
Albumin: 2.6 g/dL — ABNORMAL LOW (ref 3.5–5.0)
Alkaline Phosphatase: 56 U/L (ref 38–126)
Bilirubin, Direct: 0.1 mg/dL (ref 0.0–0.2)
Indirect Bilirubin: 0.1 mg/dL — ABNORMAL LOW (ref 0.3–0.9)
Total Bilirubin: 0.2 mg/dL — ABNORMAL LOW (ref 0.3–1.2)
Total Protein: 6.7 g/dL (ref 6.5–8.1)

## 2017-12-19 LAB — LIPASE, BLOOD: Lipase: 173 U/L — ABNORMAL HIGH (ref 11–51)

## 2017-12-19 LAB — I-STAT TROPONIN, ED: Troponin i, poc: 0.02 ng/mL (ref 0.00–0.08)

## 2017-12-19 MED ORDER — IOPAMIDOL (ISOVUE-300) INJECTION 61%
100.0000 mL | Freq: Once | INTRAVENOUS | Status: AC | PRN
  Administered 2017-12-19: 100 mL via INTRAVENOUS

## 2017-12-19 MED ORDER — IOPAMIDOL (ISOVUE-300) INJECTION 61%
INTRAVENOUS | Status: AC
  Filled 2017-12-19: qty 100

## 2017-12-19 MED ORDER — KETOROLAC TROMETHAMINE 30 MG/ML IJ SOLN
30.0000 mg | Freq: Once | INTRAMUSCULAR | Status: AC
  Administered 2017-12-19: 30 mg via INTRAVENOUS
  Filled 2017-12-19: qty 1

## 2017-12-19 MED ORDER — HYDROCODONE-ACETAMINOPHEN 5-325 MG PO TABS: 1.0000 | ORAL_TABLET | Freq: Four times a day (QID) | ORAL | 0 refills | Status: DC | PRN

## 2017-12-19 NOTE — ED Notes (Signed)
Patient transported to CT 

## 2017-12-19 NOTE — ED Provider Notes (Signed)
[facility] DEPT Provider Note   CSN: [id number] Arrival date & time: 12/19/17  0253     History   Chief Complaint Chief Complaint  Patient presents with  . Chest Pain    HPI CODIE KROGH is a 72 y.o. male.  Patient is a 72 year old male with past medical history of diabetes, hypertension, hyperlipidemia, prostate cancer, and pancreatitis.  He presents today for evaluation of epigastric pain.  This started 4 days ago and is worsening.  He states that his pain is worse when he eats or drinks.  He denies any fevers, chills, shortness of breath, productive cough.  He denies any recent exertional symptoms.  The history is provided by the patient.  Chest Pain   This is a new problem. Episode onset: 4 days ago. The problem occurs constantly. The problem has been gradually worsening. Associated with: Eating and drinking. The pain is moderate. The quality of the pain is described as sharp. The pain does not radiate. Associated symptoms include nausea. Pertinent negatives include no cough, no exertional chest pressure and no shortness of breath. He has tried nothing for the symptoms.    Past Medical History:  Diagnosis Date  . Depression   . Diabetes mellitus without complication ([place])   . GERD (gastroesophageal reflux disease)    if drinks alcohol  . 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 Hyperlipidemia   . Hypertension   . Malignant neoplasm of prostate ([place]) 01/09/2014  . Pancreatitis   . Prostate cancer ([place]) 12/19/13   Gleason 4+3=7, volume 31.31 cc  . Shortness of breath dyspnea    with exertion     Patient Active Problem List   Diagnosis Date Noted  . Prostate cancer ([place]) 02/27/2014  . Malignant neoplasm of prostate ([place]) 01/09/2014  . Bunion 01/17/2013  . 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 biopsy on  throat     hx of   . FOOT SURGERY    . LYMPHADENECTOMY Bilateral 02/27/2014   Procedure: BILATERAL LYMPHADENECTOMY;  Surgeon: Alexis Frock, MD;  Location: [facility];  Service: Urology;  Laterality: Bilateral;  . PROSTATE BIOPSY  12/2013   Gleason 4+3=7, volume 31.31 cc  . ROBOT ASSISTED LAPAROSCOPIC RADICAL PROSTATECTOMY N/A 02/27/2014   Procedure: ROBOTIC ASSISTED LAPAROSCOPIC RADICAL PROSTATECTOMY WITH INDOCYANINE GREEN DYE;  Surgeon: Alexis Frock, MD;  Location: [facility];  Service: Urology;  Laterality: N/A;        Home Medications    Prior to Admission medications   Medication Sig Start Date End Date Taking? Authorizing Provider  aspirin EC 81 MG tablet Take 81 mg by mouth daily.    [provider]  azithromycin (ZITHROMAX) 250 MG tablet Take 1 tablet (250 mg total) by mouth daily. Take first 2 tablets together, then 1 every day until finished. 04/18/17   Blue, Olivia C, PA-C  benzonatate (TESSALON) 100 MG capsule Take 1 capsule (100 mg total) by mouth every 8 (eight) hours. Patient taking differently: Take 100 mg by mouth every 8 (eight) hours as needed.  04/15/17   Blue, Olivia C, PA-C  buPROPion (WELLBUTRIN SR) 150 MG 12 hr tablet Take 150 mg by mouth daily.     [provider]  busPIRone (BUSPAR) 5 MG tablet Take 5 mg by mouth daily. 02/14/15   [provider]  gabapentin (NEURONTIN) 300 MG capsule Take 300  mg by mouth 2 (two) times daily.     [provider]  glimepiride (AMARYL) 4 MG tablet Take 4 mg by mouth daily with breakfast.    [provider]  ibuprofen (ADVIL,MOTRIN) 800 MG tablet Take 1 tablet (800 mg total) by mouth every 8 (eight) hours as needed for moderate pain. 11/11/15   Molpus, John, MD  losartan (COZAAR) 100 MG tablet Take 25 mg by mouth daily.    [provider]  omeprazole (PRILOSEC) 20 MG capsule Take 20 mg by mouth 2 (two) times daily. 02/06/15   [provider]    Family History Family History   Problem Relation Age of Onset  . Heart disease Mother   . Cancer Sister        breast  . Colon cancer Neg Hx   . Esophageal cancer Neg Hx   . Rectal cancer Neg Hx   . Stomach cancer Neg Hx     Social History Social History   Tobacco Use  . Smoking status: Current Every Day Smoker    Packs/day: 0.50    Years: 40.00    Pack years: 20.00    Types: Cigarettes  . Smokeless tobacco: Never Used  Substance Use Topics  . Alcohol use: No    Comment: Patient states no alcohol in 2 years.   . Drug use: Yes    Types: Marijuana, Cocaine    Comment: past hx approx 30 years ago      Allergies   Penicillins   Review of Systems Review of Systems  Respiratory: Negative for cough and shortness of breath.   Cardiovascular: Positive for chest pain.  Gastrointestinal: Positive for nausea.  All other systems reviewed and are negative.    Physical Exam Updated Vital Signs BP (!) 182/93   Pulse 90   Temp 98.1 F (36.7 C)   Resp (!) 25   SpO2 98%   Physical Exam  Constitutional: He is oriented to person, place, and time. He appears well-developed and well-nourished. No distress.  HENT:  Head: Normocephalic and atraumatic.  Mouth/Throat: Oropharynx is clear and moist.  Neck: Normal range of motion. Neck supple.  Cardiovascular: Normal rate and regular rhythm. Exam reveals no friction rub.  No murmur heard. Pulmonary/Chest: Effort normal and breath sounds normal. No respiratory distress. He has no wheezes. He has no rales.  Abdominal: Soft. Bowel sounds are normal. He exhibits no distension. There is tenderness.  There is tenderness to palpation in the epigastric region.  There is no rebound or guarding.  Musculoskeletal: Normal range of motion. He exhibits no edema.  Neurological: He is alert and oriented to person, place, and time. Coordination normal.  Skin: Skin is warm and dry. He is not diaphoretic.  Nursing note and vitals reviewed.    ED Treatments / Results   Labs (all labs ordered are listed, but only abnormal results are displayed) Labs Reviewed  BASIC METABOLIC PANEL  CBC  LIPASE, BLOOD  I-STAT TROPONIN, ED    EKG EKG Interpretation  Date/Time:  Monday December 19 2017 03:02:54 EDT Ventricular Rate:  90 PR Interval:    QRS Duration: 106 QT Interval:  359 QTC Calculation: 440 R Axis:   -60 Text Interpretation:  Sinus rhythm LAD, consider left anterior fascicular block Abnormal R-wave progression, early transition Abnormal T, consider ischemia, diffuse leads Minimal ST elevation, anterior leads Baseline wander in lead(s) V1 Changes noted when compared with 02/18/2015 Confirmed by Veryl Speak [id number]) on 12/19/2017 3:08:25 AM  Radiology Dg Chest 2 View  Result Date: 12/19/2017 CLINICAL DATA:  Central chest and epigastric pain for 4 days. Postprandial vomiting. EXAM: CHEST - 2 VIEW COMPARISON:  Chest radiograph January 06, 2017 FINDINGS: Cardiomediastinal silhouette is normal. No pleural effusions or focal consolidations. Trachea projects midline and there is no pneumothorax. Soft tissue planes and included osseous structures are non-suspicious. Mild degenerative changes thoracic spine. IMPRESSION: Negative. Electronically Signed   By: Elon Alas M.D.   On: 12/19/2017 03:38    Procedures Procedures (including critical care time)  Medications Ordered in ED Medications - No data to display   Initial Impression / Assessment and Plan / ED Course  I have reviewed the triage vital signs and the nursing notes.  Pertinent labs & imaging results that were available during my care of the patient were reviewed by me and considered in my medical decision making (see chart for details).  Patient is a 72 year old male presenting with epigastric pain.  His pain is reproducible with palpation of the epigastrium and his lipase is elevated near 200.  He has a history of pancreatitis and states that this feels the same.  His EKG shows  nonspecific changes, but troponin is negative with 2 days worth of discomfort.  I highly doubt a cardiac etiology.  A CT scan was obtained showing cholelithiasis, however no ductal dilatation or evidence for obstruction.  His LFTs are unremarkable and he has no white count.  I believe he is appropriate for discharge.  I will recommend a clear liquid diet, pain medicine, and slowly advancing diet as tolerated over the next several days.  Final Clinical Impressions(s) / ED Diagnoses   Final diagnoses:  None    ED Discharge Orders    None       Veryl Speak, MD 12/19/17 [phone]

## 2017-12-19 NOTE — Discharge Instructions (Addendum)
Hydrocodone as prescribed as needed for pain.  Clear liquid diet as tolerated for the next 24 hours, then slowly advance as tolerated.  Return to the emergency department if you develop worsening pain, high fever, bloody stool or vomit, or other new and concerning symptoms.

## 2017-12-19 NOTE — ED Notes (Signed)
Patient transported to X-ray 

## 2017-12-19 NOTE — ED Triage Notes (Addendum)
Pt reports central chest and epigastric pain x4 days. He reports that he is vomiting after most meals. He states that pain is worse after drinking water. A&Ox4. Denies SOB or diaphoresis. Endorses nausea.

## 2018-02-14 ENCOUNTER — Encounter: Payer: Self-pay | Admitting: Sports Medicine

## 2018-02-14 ENCOUNTER — Ambulatory Visit (INDEPENDENT_AMBULATORY_CARE_PROVIDER_SITE_OTHER): Payer: Medicare HMO | Admitting: Sports Medicine

## 2018-02-14 DIAGNOSIS — E1140 Type 2 diabetes mellitus with diabetic neuropathy, unspecified: Secondary | ICD-10-CM

## 2018-02-14 DIAGNOSIS — M79672 Pain in left foot: Secondary | ICD-10-CM

## 2018-02-14 DIAGNOSIS — M79675 Pain in left toe(s): Secondary | ICD-10-CM

## 2018-02-14 DIAGNOSIS — M79671 Pain in right foot: Secondary | ICD-10-CM | POA: Diagnosis not present

## 2018-02-14 DIAGNOSIS — M21619 Bunion of unspecified foot: Secondary | ICD-10-CM

## 2018-02-14 DIAGNOSIS — M79674 Pain in right toe(s): Secondary | ICD-10-CM | POA: Diagnosis not present

## 2018-02-14 DIAGNOSIS — B351 Tinea unguium: Secondary | ICD-10-CM

## 2018-02-14 DIAGNOSIS — L84 Corns and callosities: Secondary | ICD-10-CM

## 2018-02-14 NOTE — Progress Notes (Signed)
Subjective: Gregory Nunez is a 72 y.o. male patient with history of diabetes who presents to office today complaining of painful callus and nails while ambulating in shoes; unable to trim. His glucose reading this morning was not recorded, "Dont Check, I allow my doctor to check"; denies any new changes in medication or new problems.  Denies any changes with medicines or health since last visit.  Patient Active Problem List   Diagnosis Date Noted  . Prostate cancer ([place]) 02/27/2014  . Malignant neoplasm of prostate ([place]) 01/09/2014  . Bunion 01/17/2013  . 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 glimepiride (AMARYL) 4 MG tablet Take 4 mg by mouth daily with breakfast.    . HYDROcodone-acetaminophen (NORCO) 5-325 MG tablet Take 1-2 tablets by mouth every 6 (six) hours as needed. 15 tablet 0  . losartan (COZAAR) 100 MG tablet Take by mouth daily.      Current Facility-Administered Medications on File Prior to Visit  Medication Dose Route Frequency Provider Last Rate Last Dose  . 0.9 %  sodium chloride infusion  500 mL Intravenous Continuous Armbruster, Carlota Raspberry, MD       Allergies  Allergen Reactions  . Penicillins Anxiety    Has patient had a PCN reaction causing immediate rash, facial/tongue/throat swelling, SOB or lightheadedness with hypotension:No Has patient had a PCN reaction causing severe rash involving mucus membranes or skin necrosis:No Has patient had a PCN reaction that required hospitalization: No Has patient had a PCN reaction occurring within the last 10 years: No      Recent Results (from the past 2160 hour(s))  Basic metabolic panel     Status: Abnormal   Collection Time: 12/19/17  3:37 AM  Result Value Ref Range   Sodium 140 135 - 145 mmol/L   Potassium 3.3 (L) 3.5 - 5.1 mmol/L   Chloride 105 98 - 111 mmol/L   CO2 26 22 - 32 mmol/L   Glucose, Bld 302 (H) 70 - 99 mg/dL    BUN 13 8 - 23 mg/dL   Creatinine, Ser 0.93 0.61 - 1.24 mg/dL   Calcium 8.6 (L) 8.9 - 10.3 mg/dL   GFR calc non Af Amer >60 >60 mL/min   GFR calc Af Amer >60 >60 mL/min    Comment: (NOTE) The eGFR has been calculated using the CKD EPI equation. This calculation has not been validated in all clinical situations. eGFR's persistently <60 mL/min signify possible Chronic Kidney Disease.    Anion gap 9 5 - 15    Comment: Performed at [facility], [place] [address], [place], [place] [address]  CBC     Status: Abnormal   Collection Time: 12/19/17  3:37 AM  Result Value Ref Range   WBC 4.7 4.0 - 10.5 K/uL   RBC 4.08 (L) 4.22 - 5.81 MIL/uL   Hemoglobin 12.8 (L) 13.0 - 17.0 g/dL   HCT 37.0 (L) 39.0 - 52.0 %   MCV 90.7 78.0 - 100.0 fL   MCH 31.4 26.0 - 34.0 pg   MCHC 34.6 30.0 - 36.0 g/dL   RDW 11.6 11.5 - 15.5 %   Platelets 181 150 - 400 K/uL    Comment: Performed at [facility], [place] [address] [place], [place] [address]  Lipase, blood     Status: Abnormal   Collection Time: 12/19/17  3:37 AM  Result Value Ref Range   Lipase  173 (H) 11 - 51 U/L    Comment: RESULTS CONFIRMED BY MANUAL DILUTION Performed at [facility] [address], [place], [place] [address]   Hepatic function panel     Status: Abnormal   Collection Time: 12/19/17  3:37 AM  Result Value Ref Range   Total Protein 6.7 6.5 - 8.1 g/dL   Albumin 2.6 (L) 3.5 - 5.0 g/dL   AST 38 15 - 41 U/L   ALT 32 0 - 44 U/L   Alkaline Phosphatase 56 38 - 126 U/L   Total Bilirubin 0.2 (L) 0.3 - 1.2 mg/dL   Bilirubin, Direct 0.1 0.0 - 0.2 mg/dL   Indirect Bilirubin 0.1 (L) 0.3 - 0.9 mg/dL    Comment: Performed at [facility], [place] [address], [place], [place] [address]  I-stat troponin, ED     Status: None   Collection Time: 12/19/17  3:40 AM  Result Value Ref Range   Troponin i, poc 0.02 0.00 - 0.08 ng/mL   Comment 3            Comment: Due to the release  kinetics of cTnI, a negative result within the first hours of the onset of symptoms does not rule out myocardial infarction with certainty. If myocardial infarction is still suspected, repeat the test at appropriate intervals.     Objective: General: Patient is awake, alert, and oriented x 3 and in no acute distress.  Integument: Skin is warm, dry and supple bilateral. Nails are mildly elongated and mildly, thickened and  dystrophic with subungual debris, consistent with onychomycosis, 1-5 bilateral. No signs of infection. Callus sub-met 5 bilateral and medial second toe on right and lateral first toe on the right with no signs of infection. Remaining integument unremarkable.  Vasculature:  Dorsalis Pedis pulse 2/4 bilateral. Posterior Tibial pulse  2/4 bilateral.  Capillary fill time <3 sec 1-5 bilateral. Positive hair growth to the level of the digits. Temperature gradient within normal limits. No varicosities present bilateral. No edema present bilateral.   Neurology: The patient has diminished sensation measured with a 5.07/10g Semmes Weinstein Monofilament at all pedal sites bilateral. Vibratory sensation diminished bilateral with tuning fork. No Babinski sign present bilateral.   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 Muscular strength 5/5 in all lower extremity muscular groups bilateral without pain on range of motion . No tenderness with calf compression bilateral.  Assessment and Plan: Problem List Items Addressed This Visit      Musculoskeletal and Integument   Bunion    Other Visit Diagnoses    Corns and callosities    -  Primary   Pain due to onychomycosis of toenails of both feet       Type 2 diabetes, controlled, with neuropathy (HCC)       Foot pain, bilateral         -Examined patient. -Discussed and educated patient on diabetic foot care, especially with  regards to the vascular, neurological and musculoskeletal systems.   -Stressed the importance of good glycemic control and the detriment of not  controlling glucose levels in relation to the foot. -Mechanically debrided nails x10 using a sterile nail nipper without incident and debrided callus 3 using sterile chisel blade without incident  -Continue with toe spacer for right foot; a new spacer was dispensed at this visit -Continue with diabetic shoes/insoles as previously recommended -Answered all patient questions -Patient to return  in 3 months for at risk foot care -Patient advised  to call the office if any problems or questions arise in the meantime.  Landis Martins, DPM

## 2018-04-05 ENCOUNTER — Ambulatory Visit: Payer: Self-pay | Admitting: General Surgery

## 2018-04-05 NOTE — H&P (Signed)
Gregory Nunez Documented: 04/05/2018 9:27 AM Location: [facility] Patient #: [id number] DOB: January 17, 1946 Single / Language: Cleophus Molt / Race: Black or African American Male  History of Present Illness Randall Hiss M. Marybel Alcott MD; 04/05/2018 10:56 AM) The patient is a 72 year old male who presents for evaluation of gall stones. He is referred by Dr Jeanie Cooks for evaluation of gallstone pancreatitis. He reports at least 2 episodes of where he has had epigastric pain radiating to his back. It is associated with nausea and occasional emesis. His most recent episode was in early September which prompted him to go the emergency room. He was found to have a mildly elevated lipase at 173 without any radiological findings of pancreatitis. He had no radiological findings of cholecystitis but did have cholelithiasis on imaging. He denies any alcohol use or any medications around that time. He denies any family history of pancreatitis. He denies any fevers or chills with these episodes. He has not had any additional episodes since the trip to the emergency room. He denies any acholic stools. He denies any weight loss. He denies any melena or hematochezia. He has had a robotic prostatectomy for prostate cancer years ago. He does also have constipation which is another complaint. He states that he averages a bowel movement about twice a week. He is a chronic smoker but stopped smoking about 9 days ago. He is easing a patch. He lives alone. He denies any TIAs or amaurosis fugax.   Problem List/Past Medical Randall Hiss M. 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  Diagnostic Studies History Sabino Gasser, CMA; 04/05/2018 9:29 AM) Colonoscopy 1-5 years ago  Allergies Sabino Gasser, CMA; 04/05/2018 9:28 AM) Penicillins Allergies Reconciled  Medication  History Sabino Gasser, CMA; 04/05/2018 9:28 AM) Losartan Potassium (100MG  Tablet, Oral) Active. Glimepiride (4MG  Tablet, Oral) Active. amLODIPine Besylate (5MG  Tablet, Oral) Active. traZODone HCl (50MG  Tablet, Oral) Active. Medications Reconciled  Social History Sabino Gasser, CMA; 04/05/2018 9:29 AM) Alcohol use Remotely quit alcohol use. Caffeine use Carbonated beverages, Coffee, Tea. Illicit drug use Remotely quit drug use. Tobacco use Former smoker.  Family History Sabino Gasser, [place]; 04/05/2018 9:29 AM) Breast Cancer Sister. Diabetes Mellitus Mother. Heart Disease Mother. Hypertension Mother. Melanoma Sister.  Other Problems Randall Hiss M. Redmond Pulling, MD; 04/05/2018 10:58 AM) Anxiety Disorder Bladder Problems Cholelithiasis Diabetes Mellitus Enlarged Prostate High blood pressure Pancreatitis Prostate Cancer Sleep Apnea     Review of Systems Sabino Gasser CMA; 04/05/2018 9:29 AM) General Present- Fatigue. Not Present- Appetite Loss, Chills, Fever, Night Sweats, Weight Gain and Weight Loss. Skin Not Present- Change in Wart/Mole, Dryness, Hives, Jaundice, New Lesions, Non-Healing Wounds, Rash and Ulcer. HEENT Present- Ringing in the Ears and Visual Disturbances. Not Present- Earache, Hearing Loss, Hoarseness, Nose Bleed, Oral Ulcers, Seasonal Allergies, Sinus Pain, Sore Throat, Wears glasses/contact lenses and Yellow Eyes. Respiratory Present- Chronic Cough, Difficulty Breathing and Wheezing. Not Present- Bloody sputum and Snoring. Cardiovascular Present- Difficulty Breathing Lying Down, Leg Cramps and Shortness of Breath. Not Present- Chest Pain, Palpitations, Rapid Heart Rate and Swelling of Extremities. Gastrointestinal Present- Abdominal Pain, Change in Bowel Habits, Constipation, Gets full quickly at meals and Vomiting. Not Present- Bloating, Bloody Stool, Chronic diarrhea, Difficulty Swallowing, Excessive gas, Hemorrhoids, Indigestion, Nausea and  Rectal Pain. Male Genitourinary Present- Change in Urinary Stream, Frequency, Nocturia, Urgency and Urine Leakage. Not Present- Blood in Urine, Impotence and Painful Urination. Musculoskeletal Not Present- Back Pain,  Joint Pain, Joint Stiffness, Muscle Pain, Muscle Weakness and Swelling of Extremities. Neurological Present- Decreased Memory and Weakness. Not Present- Fainting, Headaches, Numbness, Seizures, Tingling, Tremor and Trouble walking. Psychiatric Present- Anxiety and Change in Sleep Pattern. Not Present- Bipolar, Depression, Fearful and Frequent crying. Endocrine Present- Excessive Hunger and New Diabetes. Not Present- Cold Intolerance, Hair Changes, Heat Intolerance and Hot flashes.  Vitals Sabino Gasser CMA; 04/05/2018 9:29 AM) 04/05/2018 9:29 AM Weight: 184.25 lb Height: 71in Body Surface Area: 2.04 m Body Mass Index: 25.7 kg/m  Temp.: 98.57F(Oral)  Pulse: 108 (Regular)  BP: 124/62 (Sitting, Left Arm, Standard)      Physical Exam Randall Hiss M. Keyairra Kolinski MD; 04/05/2018 10:53 AM)  General Mental Status-Alert. General Appearance-Consistent with stated age. Hydration-Well hydrated. Voice-Normal.  Head and Neck Head-normocephalic, atraumatic with no lesions or palpable masses. Trachea-midline. Thyroid Gland Characteristics - normal size and consistency.  Eye Eyeball - Bilateral-Normal. Sclera/Conjunctiva - Bilateral-No scleral icterus.  Chest and Lung Exam Chest and lung exam reveals -quiet, even and easy respiratory effort with no use of accessory muscles and on auscultation, normal breath sounds, no adventitious sounds and normal vocal resonance. Inspection Chest Wall - Normal. Back - normal.  Breast - Did not examine.  Cardiovascular Cardiovascular examination reveals -normal heart sounds, regular rate and rhythm with no murmurs and normal pedal pulses bilaterally.  Abdomen Inspection Inspection of the abdomen reveals - No  Hernias. Skin - Scar - Note: well healed trocar scars. Palpation/Percussion Palpation and Percussion of the abdomen reveal - Soft, Non Tender, No Rebound tenderness, No Rigidity (guarding) and No hepatosplenomegaly. Auscultation Auscultation of the abdomen reveals - Bowel sounds normal.  Peripheral Vascular Upper Extremity Palpation - Pulses bilaterally normal.  Neurologic Neurologic evaluation reveals -alert and oriented x 3 with no impairment of recent or remote memory. Mental Status-Normal.  Neuropsychiatric The patient's mood and affect are described as -normal. Judgment and Insight-insight is appropriate concerning matters relevant to self.  Musculoskeletal Normal Exam - Left-Upper Extremity Strength Normal and Lower Extremity Strength Normal. Normal Exam - Right-Upper Extremity Strength Normal and Lower Extremity Strength Normal.  Lymphatic Head & Neck  General Head & Neck Lymphatics: Bilateral - Description - Normal. Axillary - Did not examine. Femoral & Inguinal - Did not examine.    Assessment & Plan Randall Hiss M. Madisen Ludvigsen MD; 04/05/2018 10:57 AM)  CONSTIPATION IN MALE (K59.00) Impression: We discussed his constipation. We discussed strategies to make him more regular. 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 He has no other risk factors for pancreatitis. Therefore agree with his primary care doctor that this is probably gallstone pancreatitis.  I believe the patient's symptoms are consistent with gallbladder disease.  We discussed gallbladder disease. The patient was given Neurosurgeon. We discussed non-operative and operative management. We discussed the signs & symptoms of acute cholecystitis  I discussed laparoscopic cholecystectomy with IOC in detail. The patient was given educational  material as well as diagrams detailing the procedure. We discussed the risks and benefits of a laparoscopic cholecystectomy including, but not limited to bleeding, infection, injury to surrounding structures such as the intestine or liver, bile leak, retained gallstones, need to convert to an open procedure, prolonged diarrhea, blood clots such as DVT, common bile duct injury, anesthesia risks, and possible need for additional procedures. We discussed the typical post-operative recovery course. I explained that the likelihood of improvement of  their symptoms is good.  The patient has elected to proceed with surgery.  Current Plans Pt Education - Pamphlet Given - Laparoscopic Gallbladder Surgery: discussed with patient and provided information.  Leighton Ruff. Redmond Pulling, MD, FACS General, Bariatric, & Minimally Invasive Surgery [facility], [place]

## 2018-04-06 NOTE — Patient Instructions (Addendum)
Gregory Nunez  04/06/2018   Your procedure is scheduled on: Tuesday 04/25/2018  Report to [facility] Main  Entrance              Report to admitting at  1150  AM    Call this number if you have problems the morning of surgery [phone]    Remember: Do not eat food :After Midnight.  NO SOLID FOOD AFTER MIDNIGHT THE NIGHT PRIOR TO SURGERY. NOTHING BY MOUTH EXCEPT CLEAR LIQUIDS UNTIL 3 HOURS PRIOR TO [place] SURGERY. PLEASE FINISH ENSURE DRINK PER SURGEON ORDER 3 HOURS PRIOR TO SCHEDULED SURGERY TIME WHICH NEEDS TO BE COMPLETED AT 1050 am.    CLEAR LIQUID DIET   Foods Allowed                                                                     Foods Excluded  Coffee and tea, regular and decaf                             liquids that you cannot  Plain Jell-O in any flavor                                             see through such as: Fruit ices (not with fruit pulp)                                     milk, soups, orange juice  Iced Popsicles                                    All solid food Carbonated beverages, regular and diet                                    Cranberry, grape and apple juices Sports drinks like Gatorade Lightly seasoned clear broth or consume(fat free) Sugar, honey syrup  Sample Menu Breakfast                                Lunch                                     Supper Cranberry juice                    Beef broth                            Chicken broth Jell-O  Grape juice                           Apple juice Coffee or tea                        Jell-O                                      Popsicle                                                Coffee or tea                        Coffee or tea  _____________________________________________________________________  How to Manage Your Diabetes Before and After Surgery  Why is it important to control my blood sugar before and after  surgery? . Improving blood sugar levels before and after surgery helps healing and can limit problems. . A way of improving blood sugar control is eating a healthy diet by: o  Eating less sugar and carbohydrates o  Increasing activity/exercise o  Talking with your doctor about reaching your blood sugar goals . High blood sugars (greater than 180 mg/dL) can raise your risk of infections and slow your recovery, so you will need to focus on controlling your diabetes during the weeks before surgery. . Make sure that the doctor who takes care of your diabetes knows about your planned surgery including the date and location.  How do I manage my blood sugar before surgery? . Check your blood sugar at least 4 times a day, starting 2 days before surgery, to make sure that the level is not too high or low. o Check your blood sugar the morning of your surgery when you wake up and every 2 hours until you get to the Short Stay unit. . If your blood sugar is less than 70 mg/dL, you will need to treat for low blood sugar: o Do not take insulin. o Treat a low blood sugar (less than 70 mg/dL) with  cup of clear juice (cranberry or apple), 4 glucose tablets, OR glucose gel. o Recheck blood sugar in 15 minutes after treatment (to make sure it is greater than 70 mg/dL). If your blood sugar is not greater than 70 mg/dL on recheck, call [phone] for further instructions. . Report your blood sugar to the short stay nurse when you get to Short Stay.  . If you are admitted to the hospital after surgery: o Your blood sugar will be checked by the staff and you will probably be given insulin after surgery (instead of oral diabetes medicines) to make sure you have good blood sugar levels. o The goal for blood sugar control after surgery is 80-180 mg/dL.   WHAT DO I DO ABOUT MY DIABETES MEDICATION?        The day before surgery, Do not take the evening dose of (Glimepiride( Amaryl)!  . Do not take oral diabetes  medicines (pills) the morning of surgery.                    BRUSH YOUR TEETH MORNING OF SURGERY AND RINSE YOUR MOUTH OUT, NO  CHEWING GUM CANDY OR MINTS.     Take these medicines the morning of surgery with A SIP OF WATER: none               DO NOT TAKE ANY DIABETIC MEDICATIONS DAY OF YOUR SURGERY                               You may not have any metal on your body including hair pins and              piercings  Do not wear jewelry, make-up, lotions, powders or perfumes, deodorant             Men may shave face and neck.   Do not bring valuables to the hospital. [place].  Contacts, dentures or bridgework may not be worn into surgery.  Leave suitcase in the car. After surgery it may be brought to your room.                  Please read over the following fact sheets you were given: _____________________________________________________________________             [facility] - Preparing for Surgery Before surgery, you can play an important role.  Because skin is not sterile, your skin needs to be as free of germs as possible.  You can reduce the number of germs on your skin by washing with CHG (chlorahexidine gluconate) soap before surgery.  CHG is an antiseptic cleaner which kills germs and bonds with the skin to continue killing germs even after washing. Please DO NOT use if you have an allergy to CHG or antibacterial soaps.  If your skin becomes reddened/irritated stop using the CHG and inform your nurse when you arrive at Short Stay. Do not shave (including legs and underarms) for at least 48 hours prior to the first CHG shower.  You may shave your face/neck. Please follow these instructions carefully:  1.  Shower with CHG Soap the night before surgery and the  morning of Surgery.  2.  If you choose to wash your hair, wash your hair first as usual with your  normal  shampoo.  3.  After you shampoo, rinse your hair and body  thoroughly to remove the  shampoo.                           4.  Use CHG as you would any other liquid soap.  You can apply chg directly  to the skin and wash                       Gently with a scrungie or clean washcloth.  5.  Apply the CHG Soap to your body ONLY FROM THE NECK DOWN.   Do not use on face/ open                           Wound or open sores. Avoid contact with eyes, ears mouth and genitals (private parts).                       Wash face,  Genitals (private parts) with your normal soap.  6.  Wash thoroughly, paying special attention to the area where your surgery  will be performed.  7.  Thoroughly rinse your body with warm water from the neck down.  8.  DO NOT shower/wash with your normal soap after using and rinsing off  the CHG Soap.                9.  Pat yourself dry with a clean towel.            10.  Wear clean pajamas.            11.  Place clean sheets on your bed the night of your first shower and do not  sleep with pets. Day of Surgery : Do not apply any lotions/deodorants the morning of surgery.  Please wear clean clothes to the hospital/surgery center.  FAILURE TO FOLLOW THESE INSTRUCTIONS MAY RESULT IN THE CANCELLATION OF YOUR SURGERY PATIENT SIGNATURE_________________________________  NURSE SIGNATURE__________________________________  ________________________________________________________________________

## 2018-04-06 NOTE — Progress Notes (Addendum)
12/20/2017- noted in [place]  12/19/2017- noted in Epic- CXR  and CT abd. Pelvis w/contrast

## 2018-04-10 ENCOUNTER — Encounter (HOSPITAL_COMMUNITY)
Admission: RE | Admit: 2018-04-10 | Discharge: 2018-04-10 | Disposition: A | Discharge status: Home / Self Care | Payer: Medicare HMO | Source: Ambulatory Visit | Attending: General Surgery | Admitting: General Surgery

## 2018-04-10 ENCOUNTER — Encounter (HOSPITAL_COMMUNITY): Payer: Self-pay

## 2018-04-10 ENCOUNTER — Encounter (HOSPITAL_COMMUNITY): Payer: Self-pay | Admitting: Anesthesiology

## 2018-04-10 ENCOUNTER — Other Ambulatory Visit: Payer: Self-pay

## 2018-04-10 DIAGNOSIS — K851 Biliary acute pancreatitis without necrosis or infection: Secondary | ICD-10-CM | POA: Diagnosis not present

## 2018-04-10 DIAGNOSIS — Z01818 Encounter for other preprocedural examination: Secondary | ICD-10-CM | POA: Diagnosis present

## 2018-04-10 DIAGNOSIS — I10 Essential (primary) hypertension: Secondary | ICD-10-CM | POA: Diagnosis not present

## 2018-04-10 LAB — COMPREHENSIVE METABOLIC PANEL
ALT: 30 U/L (ref 0–44)
AST: 38 U/L (ref 15–41)
Albumin: 2.8 g/dL — ABNORMAL LOW (ref 3.5–5.0)
Alkaline Phosphatase: 52 U/L (ref 38–126)
Anion gap: 8 (ref 5–15)
BUN: 16 mg/dL (ref 8–23)
CO2: 25 mmol/L (ref 22–32)
Calcium: 8.8 mg/dL — ABNORMAL LOW (ref 8.9–10.3)
Chloride: 107 mmol/L (ref 98–111)
Creatinine, Ser: 0.93 mg/dL (ref 0.61–1.24)
GFR calc Af Amer: >60 mL/min (ref >60)
Glucose, Bld: 89 mg/dL (ref 70–99)
Potassium: 3.4 mmol/L — ABNORMAL LOW (ref 3.5–5.1)
Sodium: 140 mmol/L (ref 135–145)
Total Bilirubin: 0.5 mg/dL (ref 0.3–1.2)
Total Protein: 6.8 g/dL (ref 6.5–8.1)

## 2018-04-10 LAB — HEMOGLOBIN A1C
Hgb A1c MFr Bld: 5.7 % — ABNORMAL HIGH (ref 4.8–5.6)
Mean Plasma Glucose: 116.89 mg/dL

## 2018-04-10 LAB — CBC
HCT: 23 % — ABNORMAL LOW (ref 39.0–52.0)
Hemoglobin: 6.6 g/dL — CL (ref 13.0–17.0)
MCH: 26.1 pg (ref 26.0–34.0)
MCHC: 28.7 g/dL — ABNORMAL LOW (ref 30.0–36.0)
MCV: 90.9 fL (ref 80.0–100.0)
Platelets: 184 10*3/uL (ref 150–400)
RBC: 2.53 MIL/uL — ABNORMAL LOW (ref 4.22–5.81)
RDW: 14.9 % (ref 11.5–15.5)
WBC: 4.4 10*3/uL (ref 4.0–10.5)
nRBC: 0 % (ref 0.0–0.2)

## 2018-04-10 LAB — GLUCOSE, CAPILLARY: Glucose-Capillary: 107 mg/dL — ABNORMAL HIGH (ref 70–99)

## 2018-04-10 MED ORDER — CHLORHEXIDINE GLUCONATE 4 % EX LIQD: 60.0000 mL | Freq: Once | CUTANEOUS | Status: DC

## 2018-04-10 NOTE — Progress Notes (Signed)
   04/10/18 0946  OBSTRUCTIVE SLEEP APNEA  Have you ever been diagnosed with sleep apnea through a sleep study? No  Do you snore loudly (loud enough to be heard through closed doors)?  1  Do you often feel tired, fatigued, or sleepy during the daytime (such as falling asleep during driving or talking to someone)? 1  Has anyone observed you stop breathing during your sleep? 1  Do you have, or are you being treated for high blood pressure? 1  BMI more than 35 kg/m2? 0  Age > 50 (1-yes) 1  Neck circumference greater than:Male 16 inches or larger, Male 17inches or larger? 0  Male Gender (Yes=1) 1  Obstructive Sleep Apnea Score 6  Score 5 or greater  Results sent to PCP

## 2018-04-10 NOTE — Progress Notes (Signed)
Consulted Dr. Ambrose Pancoast, [facility] via phone about EKG from 12/19/2017. Reviewed patient's history with Dr. Ambrose Pancoast, and he wants EKG repeated today at pre-op appointment.

## 2018-04-10 NOTE — Progress Notes (Signed)
CRITICAL VALUE ALERT  Critical Value:  Hemoglobin 6.6 called to nurse from [place] at 1058 am  Date & Time Notied:  04/10/2018  At 1108 am  Provider Notified: Dr. Brantley Stage  Orders Received/Actions taken: no new orders received

## 2018-04-10 NOTE — Progress Notes (Signed)
Consulted Dr. Ambrose Pancoast, [facility] face to face about results from EKG done today 04/10/2018 and patient is OK for surgery.

## 2018-04-15 ENCOUNTER — Observation Stay (HOSPITAL_COMMUNITY): Payer: Medicare HMO

## 2018-04-15 ENCOUNTER — Observation Stay (HOSPITAL_COMMUNITY)
Admission: EM | Admit: 2018-04-15 | Discharge: 2018-04-17 | Disposition: A | Discharge status: Home / Self Care | Payer: Medicare HMO | Attending: Internal Medicine | Admitting: Internal Medicine

## 2018-04-15 ENCOUNTER — Emergency Department (HOSPITAL_COMMUNITY): Payer: Medicare HMO

## 2018-04-15 ENCOUNTER — Other Ambulatory Visit: Payer: Self-pay

## 2018-04-15 ENCOUNTER — Encounter (HOSPITAL_COMMUNITY): Payer: Self-pay | Admitting: Emergency Medicine

## 2018-04-15 DIAGNOSIS — K31819 Angiodysplasia of stomach and duodenum without bleeding: Secondary | ICD-10-CM | POA: Insufficient documentation

## 2018-04-15 DIAGNOSIS — D649 Anemia, unspecified: Secondary | ICD-10-CM | POA: Diagnosis not present

## 2018-04-15 DIAGNOSIS — Z7984 Long term (current) use of oral hypoglycemic drugs: Secondary | ICD-10-CM | POA: Diagnosis not present

## 2018-04-15 DIAGNOSIS — K921 Melena: Secondary | ICD-10-CM | POA: Diagnosis not present

## 2018-04-15 DIAGNOSIS — D509 Iron deficiency anemia, unspecified: Principal | ICD-10-CM | POA: Insufficient documentation

## 2018-04-15 DIAGNOSIS — E785 Hyperlipidemia, unspecified: Secondary | ICD-10-CM | POA: Diagnosis not present

## 2018-04-15 DIAGNOSIS — K5909 Other constipation: Secondary | ICD-10-CM | POA: Insufficient documentation

## 2018-04-15 DIAGNOSIS — R0602 Shortness of breath: Secondary | ICD-10-CM

## 2018-04-15 DIAGNOSIS — E119 Type 2 diabetes mellitus without complications: Secondary | ICD-10-CM | POA: Diagnosis not present

## 2018-04-15 DIAGNOSIS — Z8546 Personal history of malignant neoplasm of prostate: Secondary | ICD-10-CM | POA: Insufficient documentation

## 2018-04-15 DIAGNOSIS — F17210 Nicotine dependence, cigarettes, uncomplicated: Secondary | ICD-10-CM | POA: Insufficient documentation

## 2018-04-15 DIAGNOSIS — Z79899 Other long term (current) drug therapy: Secondary | ICD-10-CM | POA: Insufficient documentation

## 2018-04-15 DIAGNOSIS — Z86010 Personal history of colonic polyps: Secondary | ICD-10-CM | POA: Insufficient documentation

## 2018-04-15 DIAGNOSIS — R0600 Dyspnea, unspecified: Secondary | ICD-10-CM | POA: Insufficient documentation

## 2018-04-15 DIAGNOSIS — I10 Essential (primary) hypertension: Secondary | ICD-10-CM | POA: Insufficient documentation

## 2018-04-15 DIAGNOSIS — K2950 Unspecified chronic gastritis without bleeding: Secondary | ICD-10-CM | POA: Diagnosis not present

## 2018-04-15 DIAGNOSIS — R5383 Other fatigue: Secondary | ICD-10-CM | POA: Diagnosis not present

## 2018-04-15 LAB — CBC
HEMATOCRIT: 18.8 % — AB (ref 39.0–52.0)
Hemoglobin: 5.3 g/dL — CL (ref 13.0–17.0)
MCH: 25.6 pg — ABNORMAL LOW (ref 26.0–34.0)
MCHC: 28.2 g/dL — ABNORMAL LOW (ref 30.0–36.0)
MCV: 90.8 fL (ref 80.0–100.0)
NRBC: 0 % (ref 0.0–0.2)
Platelets: 158 10*3/uL (ref 150–400)
RBC: 2.07 MIL/uL — ABNORMAL LOW (ref 4.22–5.81)
RDW: 16.1 % — ABNORMAL HIGH (ref 11.5–15.5)
WBC: 4.3 10*3/uL (ref 4.0–10.5)

## 2018-04-15 LAB — URINALYSIS, COMPLETE (UACMP) WITH MICROSCOPIC
Bacteria, UA: NONE SEEN
Bilirubin Urine: NEGATIVE
Glucose, UA: NEGATIVE mg/dL
Hgb urine dipstick: NEGATIVE
Ketones, ur: NEGATIVE mg/dL
Leukocytes, UA: NEGATIVE
Nitrite: NEGATIVE
Protein, ur: 30 mg/dL — AB
Specific Gravity, Urine: 1.004 — ABNORMAL LOW (ref 1.005–1.030)
pH: 8 (ref 5.0–8.0)

## 2018-04-15 LAB — BASIC METABOLIC PANEL
Anion gap: 5 (ref 5–15)
BUN: 21 mg/dL (ref 8–23)
CHLORIDE: 110 mmol/L (ref 98–111)
CO2: 24 mmol/L (ref 22–32)
CREATININE: 1.02 mg/dL (ref 0.61–1.24)
Calcium: 8.2 mg/dL — ABNORMAL LOW (ref 8.9–10.3)
GFR calc Af Amer: >60 mL/min (ref >60)
GFR calc non Af Amer: >60 mL/min (ref >60)
Glucose, Bld: 164 mg/dL — ABNORMAL HIGH (ref 70–99)
Potassium: 4 mmol/L (ref 3.5–5.1)
Sodium: 139 mmol/L (ref 135–145)

## 2018-04-15 LAB — ABO/RH: ABO/RH(D): O POS

## 2018-04-15 LAB — DIFFERENTIAL
Abs Immature Granulocytes: 0.01 10*3/uL (ref 0.00–0.07)
Basophils Absolute: 0 10*3/uL (ref 0.0–0.1)
Basophils Relative: 0 %
Eosinophils Absolute: 0.1 10*3/uL (ref 0.0–0.5)
Eosinophils Relative: 2 %
Immature Granulocytes: 0 %
Lymphocytes Relative: 38 %
Lymphs Abs: 1.8 10*3/uL (ref 0.7–4.0)
Monocytes Absolute: 0.5 10*3/uL (ref 0.1–1.0)
Monocytes Relative: 10 %
Neutro Abs: 2.3 10*3/uL (ref 1.7–7.7)
Neutrophils Relative %: 50 %

## 2018-04-15 LAB — RETICULOCYTES
Immature Retic Fract: 35 % — ABNORMAL HIGH (ref 2.3–15.9)
RBC.: 2.11 MIL/uL — AB (ref 4.22–5.81)
Retic Count, Absolute: 77.4 10*3/uL (ref 19.0–186.0)
Retic Ct Pct: 3.7 % — ABNORMAL HIGH (ref 0.4–3.1)

## 2018-04-15 LAB — FERRITIN: Ferritin: 7 ng/mL — ABNORMAL LOW (ref 24–336)

## 2018-04-15 LAB — FOLATE: Folate: 16.6 ng/mL (ref >5.9)

## 2018-04-15 LAB — POCT I-STAT, CHEM 8
BUN: 21 mg/dL (ref 8–23)
CALCIUM ION: 1.11 mmol/L — AB (ref 1.15–1.40)
CHLORIDE: 109 mmol/L (ref 98–111)
Creatinine, Ser: 1 mg/dL (ref 0.61–1.24)
Glucose, Bld: 127 mg/dL — ABNORMAL HIGH (ref 70–99)
HEMATOCRIT: 19 % — AB (ref 39.0–52.0)
Hemoglobin: 6.5 g/dL — CL (ref 13.0–17.0)
Potassium: 4.3 mmol/L (ref 3.5–5.1)
Sodium: 139 mmol/L (ref 135–145)
TCO2: 25 mmol/L (ref 22–32)

## 2018-04-15 LAB — PREPARE RBC (CROSSMATCH)

## 2018-04-15 LAB — IRON AND TIBC
Iron: 12 ug/dL — ABNORMAL LOW (ref 45–182)
Saturation Ratios: 2 % — ABNORMAL LOW (ref 17.9–39.5)
TIBC: 560 ug/dL — AB (ref 250–450)
UIBC: 548 ug/dL

## 2018-04-15 LAB — VITAMIN B12: Vitamin B-12: 292 pg/mL (ref 180–914)

## 2018-04-15 LAB — I-STAT TROPONIN, ED: Troponin i, poc: 0.01 ng/mL (ref 0.00–0.08)

## 2018-04-15 LAB — OCCULT BLOOD, POC DEVICE: Fecal Occult Bld: NEGATIVE

## 2018-04-15 MED ORDER — IPRATROPIUM-ALBUTEROL 0.5-2.5 (3) MG/3ML IN SOLN
3.0000 mL | Freq: Four times a day (QID) | RESPIRATORY_TRACT | Status: DC
  Administered 2018-04-15 – 2018-04-16 (×5): 3 mL via RESPIRATORY_TRACT
  Filled 2018-04-15 (×5): qty 3

## 2018-04-15 MED ORDER — NICOTINE 21 MG/24HR TD PT24
21.0000 mg | MEDICATED_PATCH | Freq: Every day | TRANSDERMAL | Status: DC
  Administered 2018-04-15 – 2018-04-17 (×3): 21 mg via TRANSDERMAL
  Filled 2018-04-15 (×3): qty 1

## 2018-04-15 MED ORDER — SODIUM CHLORIDE 0.9% IV SOLUTION
Freq: Once | INTRAVENOUS | Status: AC
  Administered 2018-04-15: 19:00:00 via INTRAVENOUS

## 2018-04-15 MED ORDER — IPRATROPIUM-ALBUTEROL 0.5-2.5 (3) MG/3ML IN SOLN
3.0000 mL | Freq: Four times a day (QID) | RESPIRATORY_TRACT | Status: DC | PRN
  Administered 2018-04-15: 3 mL via RESPIRATORY_TRACT
  Filled 2018-04-15: qty 3

## 2018-04-15 MED ORDER — TRAZODONE HCL 50 MG PO TABS
50.0000 mg | ORAL_TABLET | Freq: Every evening | ORAL | Status: DC | PRN
  Administered 2018-04-15: 50 mg via ORAL
  Filled 2018-04-15: qty 1

## 2018-04-15 MED ORDER — AMLODIPINE BESYLATE 5 MG PO TABS
5.0000 mg | ORAL_TABLET | Freq: Every evening | ORAL | Status: DC
  Administered 2018-04-15 – 2018-04-16 (×2): 5 mg via ORAL
  Filled 2018-04-15 (×2): qty 1

## 2018-04-15 MED ORDER — PANTOPRAZOLE SODIUM 40 MG IV SOLR
40.0000 mg | Freq: Two times a day (BID) | INTRAVENOUS | Status: DC
  Administered 2018-04-15 – 2018-04-17 (×4): 40 mg via INTRAVENOUS
  Filled 2018-04-15 (×4): qty 40

## 2018-04-15 MED ORDER — BUDESONIDE 0.25 MG/2ML IN SUSP
0.2500 mg | Freq: Two times a day (BID) | RESPIRATORY_TRACT | Status: DC
  Administered 2018-04-15 – 2018-04-17 (×4): 0.25 mg via RESPIRATORY_TRACT
  Filled 2018-04-15 (×4): qty 2

## 2018-04-15 MED ORDER — ALUM & MAG HYDROXIDE-SIMETH 200-200-20 MG/5ML PO SUSP
30.0000 mL | Freq: Four times a day (QID) | ORAL | Status: DC | PRN
  Administered 2018-04-15: 30 mL via ORAL
  Filled 2018-04-15: qty 30

## 2018-04-15 MED ORDER — HYDRALAZINE HCL 20 MG/ML IJ SOLN
10.0000 mg | Freq: Four times a day (QID) | INTRAMUSCULAR | Status: DC | PRN
  Administered 2018-04-15: 10 mg via INTRAVENOUS
  Filled 2018-04-15: qty 1

## 2018-04-15 MED ORDER — CYCLOBENZAPRINE HCL 5 MG PO TABS
5.0000 mg | ORAL_TABLET | Freq: Once | ORAL | Status: AC
  Administered 2018-04-15: 5 mg via ORAL
  Filled 2018-04-15: qty 1

## 2018-04-15 MED ORDER — FUROSEMIDE 10 MG/ML IJ SOLN
60.0000 mg | Freq: Once | INTRAMUSCULAR | Status: AC
  Administered 2018-04-15: 60 mg via INTRAVENOUS
  Filled 2018-04-15: qty 6

## 2018-04-15 MED ORDER — LOSARTAN POTASSIUM 50 MG PO TABS
100.0000 mg | ORAL_TABLET | Freq: Every evening | ORAL | Status: DC
  Administered 2018-04-15 – 2018-04-16 (×2): 100 mg via ORAL
  Filled 2018-04-15 (×2): qty 2

## 2018-04-15 MED ORDER — PNEUMOCOCCAL VAC POLYVALENT 25 MCG/0.5ML IJ INJ
0.5000 mL | INJECTION | INTRAMUSCULAR | Status: DC
  Filled 2018-04-15: qty 0.5

## 2018-04-15 MED ORDER — INSULIN ASPART 100 UNIT/ML ~~LOC~~ SOLN
0.0000 [IU] | Freq: Three times a day (TID) | SUBCUTANEOUS | Status: DC
  Administered 2018-04-16 – 2018-04-17 (×3): 1 [IU] via SUBCUTANEOUS

## 2018-04-15 NOTE — ED Notes (Signed)
Patient transported to X-ray 

## 2018-04-15 NOTE — ED Notes (Signed)
Signed consent at bedside  ?

## 2018-04-15 NOTE — ED Notes (Signed)
Date and time results received: 04/15/18 2:20 PM  Test: Hemoglobin Critical Value: 5.3  Name of Provider Notified: Billy Fischer

## 2018-04-15 NOTE — H&P (Signed)
History and Physical  Gregory Nunez LOV:[id number] DOB: 10/29/1945 DOA: 04/15/2018  Referring physician: ER physician PCP: Gregory Ebbs, MD  Outpatient Specialists: Urology, patient has history of prostate cancer Patient coming from: Home  Chief Complaint: Dyspnea on minimal exertion  HPI: Patient is a 72 year old African-American male with past medical history significant for diabetes mellitus, hypertension, tobacco use will likely undiagnosed COPD (patient has about 40 pack years), prostate cancer Status post prostatectomy back 2 years ago with subsequent erectile dysfunction and multiple colonic polyps as per colonoscopy done last year by the local GI group amongst other comorbidities.  Patient also has history of Goody powder use, and reports intermittent hematochezia.  Patient presents with 1 week history of severe dyspnea on minimal exertion, shortness of breath, generalized fatigue and weakness of the legs.  Patient's nephew eventually convinced patient to seek medical assistance as the patient could barely make it to the ball game yesterday.  On presentation to the ER, patient's initial hemoglobin was found to be 5.3 g/dL, and repeat hemoglobin was 6.5 g/dL.  Reviewing patient's records, patient's hemoglobin was noted to be 6.6 g/dL on 04/10/2018.  Details of the encounter on 04/10/2018 was not volunteered by the patient.  Fecal occult blood done by the ER is said to be negative.  No headache, no neck pain, no fever or chills, no URI symptoms, no chest pain, no nausea vomiting and no urinary symptoms.  Patient will be admitted for further assessment and management of symptomatic anemia.  ED Course: Patient has been typed and crossmatched.  Patient is currently being transfused with packed red blood cells.  The plan is to transfuse patient with 2 units of packed red blood cells.    Pertinent labs: CBC reveals WBC of 4.3, hemoglobin of 5.3, hematocrit of 18.8, MCV of 90.8 with platelet  count of 158.  Chemistry reveals sodium of 139, potassium of 4.3, chloride of 109, CO2 24, BUN of 21, creatinine 1.02 with blood sugar 164.  EKG: Independently reviewed.   Imaging: independently reviewed.   Review of Systems:  Negative for fever, visual changes, sore throat, rash, chest pain, dysuria, n/v/abdominal pain.  Past Medical History:  Diagnosis Date  . Depression   . Diabetes mellitus without complication ([place])   . GERD (gastroesophageal reflux disease)    if drinks alcohol  . 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 Hyperlipidemia   . Hypertension   . Malignant neoplasm of prostate ([place]) 01/09/2014  . Pancreatitis   . Prostate cancer ([place]) 12/19/13   Gleason 4+3=7, volume 31.31 cc  . Shortness of breath dyspnea    with exertion     Past Surgical History:  Procedure Laterality Date  . biopsy on throat     hx of   . FOOT SURGERY    . LYMPHADENECTOMY Bilateral 02/27/2014   Procedure: BILATERAL LYMPHADENECTOMY;  Surgeon: Alexis Frock, MD;  Location: [facility];  Service: Urology;  Laterality: Bilateral;  . PROSTATE BIOPSY  12/2013   Gleason 4+3=7, volume 31.31 cc  . ROBOT ASSISTED LAPAROSCOPIC RADICAL PROSTATECTOMY N/A 02/27/2014   Procedure: ROBOTIC ASSISTED LAPAROSCOPIC RADICAL PROSTATECTOMY WITH INDOCYANINE GREEN DYE;  Surgeon: Alexis Frock, MD;  Location: [facility] OR[facility];  Service: Urology;  Laterality: N/A;     reports that he has been smoking cigarettes. He has a 20.00 pack-year smoking history. He has never used smokeless tobacco. He reports previous drug use. Drugs: Marijuana and Cocaine. He reports  that he does not drink alcohol.  Allergies  Allergen Reactions  . Penicillins Anxiety and Other (See Comments)    DID THE REACTION INVOLVE: Swelling of the face/tongue/throat, SOB, or low BP? No Sudden or severe rash/hives, skin peeling, or the inside of the mouth or nose? No Did it require medical  treatment? No When did it last happen?many years ago If all above answers are "NO", may proceed with cephalosporin use.       Family History  Problem Relation Age of Onset  . Heart disease Mother   . Cancer Sister        breast  . Colon cancer Neg Hx   . Esophageal cancer Neg Hx   . Rectal cancer Neg Hx   . Stomach cancer Neg Hx      Prior to Admission medications   Medication Sig Start Date End Date Taking? Authorizing Provider  amLODipine (NORVASC) 5 MG tablet Take 5 mg by mouth every evening.   Yes [provider]  glimepiride (AMARYL) 4 MG tablet Take 4 mg by mouth every evening.    Yes [provider]  losartan (COZAAR) 100 MG tablet Take 100 mg by mouth every evening.    Yes [provider]  nicotine (NICODERM CQ - DOSED IN MG/24 HOURS) 21 mg/24hr patch Place 21 mg onto the skin daily.   Yes [provider]  traZODone (DESYREL) 50 MG tablet Take 50 mg by mouth at bedtime as needed for sleep.   Yes [provider]    Physical Exam: Vitals:   04/15/18 1500 04/15/18 1605 04/15/18 1635 04/15/18 1712  BP: (!) 141/85 (!) 147/71 (!) 153/74 (!) 151/78  Pulse: 98 (!) 106 (!) 102 (!) 102  Resp: 20 20 (!) 24 (!) 24  Temp:   97.8 F (36.6 C) 98.4 F (36.9 C)  TempSrc:   Oral Oral  SpO2: 100% 99% 94% 94%     Constitutional:  . Dyspneic on minimal exertion. Eyes:  . Pallor.  No jaundice.  ENMT:  . external ears, nose appear normal.  Dry buccal mucosa. Neck:  . Neck is supple. No JVD Respiratory:  . Decreased air entry globally.  Inspiratory and expiratory wheeze. Cardiovascular:  . S1S2 . No LE extremity edema   Abdomen:  . Abdomen is soft and non tender. Organs are difficult to assess. Neurologic:  . Awake and alert. . Moves all limbs.  Wt Readings from Last 3 Encounters:  04/10/18 82.1 kg  07/19/16 81.6 kg  07/05/16 82 kg    I have personally reviewed following labs and imaging studies  Labs on  Admission:  CBC: Recent Labs  Lab 04/10/18 1014 04/15/18 1351 04/15/18 1432 04/15/18 1504  WBC 4.4 4.3  --   --   NEUTROABS  --   --  2.3  --   HGB 6.6* 5.3*  --  6.5*  HCT 23.0* 18.8*  --  19.0*  MCV 90.9 90.8  --   --   PLT 184 158  --   --    Basic Metabolic Panel: Recent Labs  Lab 04/10/18 1014 04/15/18 1351 04/15/18 1504  NA 140 139 139  K 3.4* 4.0 4.3  CL 107 110 109  CO2 25 24  --   GLUCOSE 89 164* 127*  BUN 16 21 21   CREATININE 0.93 1.02 1.00  CALCIUM 8.8* 8.2*  --    Liver Function Tests: Recent Labs  Lab 04/10/18 1014  AST 38  ALT 30  ALKPHOS  52  BILITOT 0.5  PROT 6.8  ALBUMIN 2.8*   No results for input(s): LIPASE, AMYLASE in the last 168 hours. No results for input(s): AMMONIA in the last 168 hours. Coagulation Profile: No results for input(s): INR, PROTIME in the last 168 hours. Cardiac Enzymes: No results for input(s): CKTOTAL, CKMB, CKMBINDEX, TROPONINI in the last 168 hours. BNP (last 3 results) No results for input(s): PROBNP in the last 8760 hours. HbA1C: No results for input(s): HGBA1C in the last 72 hours. CBG: Recent Labs  Lab 04/10/18 0937  GLUCAP 107*   Lipid Profile: No results for input(s): CHOL, HDL, LDLCALC, TRIG, CHOLHDL, LDLDIRECT in the last 72 hours. Thyroid Function Tests: No results for input(s): TSH, T4TOTAL, FREET4, T3FREE, THYROIDAB in the last 72 hours. Anemia Panel: Recent Labs    04/15/18 1432  FOLATE 16.6  RETICCTPCT 3.7*   Urine analysis:    Component Value Date/Time   COLORURINE YELLOW 07/04/2012 1842   APPEARANCEUR CLEAR 07/04/2012 1842   LABSPEC 1.020 07/04/2012 1842   PHURINE 5.5 07/04/2012 1842   GLUCOSEU 100 (A) 07/04/2012 1842   HGBUR NEGATIVE 07/04/2012 1842   BILIRUBINUR NEGATIVE 07/04/2012 1842   KETONESUR NEGATIVE 07/04/2012 1842   PROTEINUR 100 (A) 07/04/2012 1842   UROBILINOGEN 1.0 07/04/2012 1842   NITRITE NEGATIVE 07/04/2012 1842   LEUKOCYTESUR NEGATIVE 07/04/2012 1842   Sepsis  Labs: @LABRCNTIP (procalcitonin:4,lacticidven:4) )No results found for this or any previous visit (from the past 240 hour(s)).    Radiological Exams on Admission: Dg Chest 2 View  Result Date: 04/15/2018 CLINICAL DATA:  Dyspnea on exertion EXAM: CHEST - 2 VIEW COMPARISON:  1919 FINDINGS: Top-normal heart size. Nonaneurysmal thoracic aorta. No acute pulmonary consolidation or overt pulmonary edema. Effusion or pneumothorax. IMPRESSION: No active cardiopulmonary disease. Electronically Signed   By: Ashley Royalty M.D.   On: 04/15/2018 15:45    EKG: Independently reviewed.   Active Problems:   Symptomatic anemia   Assessment/Plan Symptomatic anemia: Source of GI bleed unclear. Possible upper GI bleed. Patient has history of Goody powder use. PPI. However, colonoscopy done last year revealed multiple colonic polyps. Patient is currently being transfused with 2 units of packed red blood cells. Monitor H&H closely. Transfuse as needed. Iron studies. Check folate and B12 level. Consult GI team. Further management will depend on hospital course.  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  Hypertension: Continue to optimize.  Diabetes mellitus: Accu-Cheks AC at bedtime Cover with sliding scale insulin HbA1c  History of prostate cancer: Patient follows up with the urology team.   Further management will depend on hospital course.  DVT prophylaxis: SCD Code Status: Full Family Communication:  Disposition Plan: Will depend on hospital course. Consults called: GI.  Discussed with Dr. Juanita Craver Admission status: Observation  Time spent: 65 minutes minutes  Dana Allan, MD  [facility] Pager #: [phone] 7PM-7AM contact night coverage as above  04/15/2018, 5:28 PM

## 2018-04-15 NOTE — ED Notes (Signed)
ED TO INPATIENT HANDOFF REPORT  Name/Age/Gender Gregory Nunez 72 y.o. male  Code Status Code Status History    Date Active Date Inactive Code Status Order ID Comments User Context   02/27/2014 1416 03/01/2014 1227 Full Code [id number]  Debbrah Alar, PA-C Inpatient      Home/SNF/Other Home  Chief Complaint shob; leg weakness  Level of Care/Admitting Diagnosis ED Disposition    ED Disposition Condition [facility]: [facility] [[id number]]  Level of Care: Telemetry [5]  Admit to tele based on following criteria: Monitor for Ischemic changes  Diagnosis: Symptomatic anemia [1761607]  Admitting Physician: Bonnell Public [[id number]]  Attending Physician: Dana Allan I [[id number]]  PT Class (Do Not Modify): Observation [104]  PT Acc Code (Do Not Modify): Observation [10022]       Medical History Past Medical History:  Diagnosis Date  . Depression   . Diabetes mellitus without complication ([place])   . GERD (gastroesophageal reflux disease)    if drinks alcohol  . 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 Hyperlipidemia   . Hypertension   . Malignant neoplasm of prostate ([place]) 01/09/2014  . Pancreatitis   . 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 Shortness of breath dyspnea    with exertion     Allergies Allergies  Allergen Reactions  . Penicillins Anxiety and Other (See Comments)    DID THE REACTION INVOLVE: Swelling of the face/tongue/throat, SOB, or low BP? No Sudden or severe rash/hives, skin peeling, or the inside of the mouth or nose? No Did it require medical treatment? No When did it last happen?many years ago If all above answers are "NO", may proceed with cephalosporin use.       IV Location/Drains/Wounds Patient Lines/Drains/Airways Status   Active Line/Drains/Airways    Name:   Placement date:   Placement  time:   Site:   Days:   Peripheral IV 04/15/18 Left Forearm   04/15/18    1443    Forearm   less than 1   Peripheral IV 04/15/18 Right Forearm   04/15/18    1446    Forearm   less than 1   Incision - 6 Ports Abdomen 1: Left;Lateral;Lower 2: Left;Lateral;Upper 3: Umbilicus;Superior 4: Right;Lateral;Lower 5: Right;Medial;Upper 6: Right;Lateral;Upper   02/27/14    0905     1508          Labs/Imaging Results for orders placed or performed during the hospital encounter of 04/15/18 (from the past 48 hour(s))  Basic metabolic panel     Status: Abnormal   Collection Time: 04/15/18  1:51 PM  Result Value Ref Range   Sodium 139 135 - 145 mmol/L   Potassium 4.0 3.5 - 5.1 mmol/L   Chloride 110 98 - 111 mmol/L   CO2 24 22 - 32 mmol/L   Glucose, Bld 164 (H) 70 - 99 mg/dL   BUN 21 8 - 23 mg/dL   Creatinine, Ser 1.02 0.61 - 1.24 mg/dL   Calcium 8.2 (L) 8.9 - 10.3 mg/dL   GFR calc non Af Amer >60 >60 mL/min   GFR calc Af Amer >60 >60 mL/min   Anion gap 5 5 - 15    Comment: Performed at [facility], [place] [address] [place], [place] [address]  CBC     Status: Abnormal   Collection Time: 04/15/18  1:51  PM  Result Value Ref Range   WBC 4.3 4.0 - 10.5 K/uL   RBC 2.07 (L) 4.22 - 5.81 MIL/uL   Hemoglobin 5.3 (LL) 13.0 - 17.0 g/dL    Comment: REPEATED TO VERIFY THIS CRITICAL RESULT HAS VERIFIED AND BEEN CALLED TO [facility],S BY BILLY HOOKER ON 12 28 2019 AT 1416, AND HAS BEEN READ BACK. CRITICAL VERIFIED    HCT 18.8 (L) 39.0 - 52.0 %   MCV 90.8 80.0 - 100.0 fL   MCH 25.6 (L) 26.0 - 34.0 pg   MCHC 28.2 (L) 30.0 - 36.0 g/dL   RDW 16.1 (H) 11.5 - 15.5 %   Platelets 158 150 - 400 K/uL   nRBC 0.0 0.0 - 0.2 %    Comment: Performed at [facility], [place] [address] [place], [place] [address]  I-stat troponin, ED     Status: None   Collection Time: 04/15/18  1:56 PM  Result Value Ref Range   Troponin i, poc 0.01 0.00 - 0.08 ng/mL   Comment 3            Comment: Due  to the release kinetics of cTnI, a negative result within the first hours of the onset of symptoms does not rule out myocardial infarction with certainty. If myocardial infarction is still suspected, repeat the test at appropriate intervals.   Type and screen [place]     Status: None (Preliminary result)   Collection Time: 04/15/18  2:32 PM  Result Value Ref Range   ABO/RH(D) O POS    Antibody Screen NEG    Sample Expiration 04/18/2018    Unit Number [id number]    Blood Component Type RED CELLS,LR    Unit division 00    Status of Unit ISSUED    Transfusion Status OK TO TRANSFUSE    Crossmatch Result      Compatible Performed at [facility], [place] [address] [place], [place] [address]    Unit Number [id number]    Blood Component Type RED CELLS,LR    Unit division 00    Status of Unit ALLOCATED    Transfusion Status OK TO TRANSFUSE    Crossmatch Result Compatible   Vitamin B12     Status: None   Collection Time: 04/15/18  2:32 PM  Result Value Ref Range   Vitamin B-12 292 180 - 914 pg/mL    Comment: (NOTE) This assay is not validated for testing neonatal or myeloproliferative syndrome specimens for Vitamin B12 levels. Performed at [facility], [place] [address], [place], [place] [address]   Folate     Status: None   Collection Time: 04/15/18  2:32 PM  Result Value Ref Range   Folate 16.6 >5.9 ng/mL    Comment: Performed at [facility], [place] [address], [place], [place] [address]  Iron and TIBC     Status: Abnormal   Collection Time: 04/15/18  2:32 PM  Result Value Ref Range   Iron 12 (L) 45 - 182 ug/dL   TIBC 560 (H) 250 - 450 ug/dL   Saturation Ratios 2 (L) 17.9 - 39.5 %   UIBC 548 ug/dL    Comment: Performed at [facility], [place] [address] [place], [place] [address]  Ferritin     Status: Abnormal   Collection Time: 04/15/18  2:32 PM  Result Value Ref Range    Ferritin 7 (L) 24 - 336 ng/mL    Comment: Performed at  [facility], [place] [address], [place], [place] [address]  Reticulocytes     Status: Abnormal   Collection Time: 04/15/18  2:32 PM  Result Value Ref Range   Retic Ct Pct 3.7 (H) 0.4 - 3.1 %   RBC. 2.11 (L) 4.22 - 5.81 MIL/uL   Retic Count, Absolute 77.4 19.0 - 186.0 K/uL   Immature Retic Fract 35.0 (H) 2.3 - 15.9 %    Comment: Performed at [facility], [place] [address] [place], [place] [address]  Differential     Status: None   Collection Time: 04/15/18  2:32 PM  Result Value Ref Range   Neutrophils Relative % 50 %   Neutro Abs 2.3 1.7 - 7.7 K/uL   Lymphocytes Relative 38 %   Lymphs Abs 1.8 0.7 - 4.0 K/uL   Monocytes Relative 10 %   Monocytes Absolute 0.5 0.1 - 1.0 K/uL   Eosinophils Relative 2 %   Eosinophils Absolute 0.1 0.0 - 0.5 K/uL   Basophils Relative 0 %   Basophils Absolute 0.0 0.0 - 0.1 K/uL   Immature Granulocytes 0 %   Abs Immature Granulocytes 0.01 0.00 - 0.07 K/uL   Polychromasia PRESENT     Comment: Performed at [facility], [place] [address], [place], [place] [address]  ABO/Rh     Status: None (Preliminary result)   Collection Time: 04/15/18  2:32 PM  Result Value Ref Range   ABO/RH(D)      O POS Performed at [facility], [place] [address] [place], [place] [address]   Occult blood, poc device     Status: None   Collection Time: 04/15/18  2:43 PM  Result Value Ref Range   Fecal Occult Bld NEGATIVE NEGATIVE  Prepare RBC     Status: None   Collection Time: 04/15/18  2:45 PM  Result Value Ref Range   Order Confirmation      ORDER PROCESSED BY BLOOD BANK Performed at [place] [address] [place],  [address]   I-STAT, [place] Clap 8     Status: Abnormal   Collection Time: 04/15/18  3:04 PM  Result Value Ref Range   Sodium 139 135 - 145 mmol/L   Potassium 4.3 3.5 - 5.1 mmol/L   Chloride 109 98 - 111 mmol/L    BUN 21 8 - 23 mg/dL   Creatinine, Ser 1.00 0.61 - 1.24 mg/dL   Glucose, Bld 127 (H) 70 - 99 mg/dL   Calcium, Ion 1.11 (L) 1.15 - 1.40 mmol/L   TCO2 25 22 - 32 mmol/L   Hemoglobin 6.5 (LL) 13.0 - 17.0 g/dL   HCT 19.0 (L) 39.0 - 52.0 %   Comment NOTIFIED PHYSICIAN    Dg Chest 2 View  Result Date: 04/15/2018 CLINICAL DATA:  Dyspnea on exertion EXAM: CHEST - 2 VIEW COMPARISON:  1919 FINDINGS: Top-normal heart size. Nonaneurysmal thoracic aorta. No acute pulmonary consolidation or overt pulmonary edema. Effusion or pneumothorax. IMPRESSION: No active cardiopulmonary disease. Electronically Signed   By: Ashley Royalty M.D.   On: 04/15/2018 15:45   EKG Interpretation  Date/Time:  Saturday April 15 2018 13:44:34 EST Ventricular Rate:  94 PR Interval:    QRS Duration: 101 QT Interval:  365 QTC Calculation: 457 R Axis:   25 Text Interpretation:  Sinus rhythm RSR' in V1 or V2, right VCD or RVH Nonspecific T abnormalities, lateral leads No significant change since last tracing Confirmed by Gareth Morgan [phone]) on 04/15/2018  3:38:28 PM   Pending Labs Unresulted Labs (From admission, onward)    Start     Ordered   04/15/18 1805  Brain natriuretic peptide  Once,   R     04/15/18 1804   04/15/18 1731  Troponin I - Now Then Q3H  Now then every 3 hours,   R     04/15/18 1730   Signed and Held  HIV antibody (Routine Testing)  Once,   R     Signed and Held   Signed and Held  Magnesium  Once,   R     Signed and Held   Signed and Held  Phosphorus  Once,   R     Signed and Held   Signed and Held  TSH  Once,   R     Signed and Held   Signed and Held  Hemoglobin A1c  Once,   R     Signed and Held   Signed and Held  Urinalysis, Complete w Microscopic  Once,   R     Signed and Held   Signed and Held  Occult blood card to lab, stool  Daily,   R     Signed and Held   Signed and Occupational hygienist morning,   R     Signed and Held   Signed and Held  CBC  Tomorrow morning,    R     Signed and Held   Signed and Held  Ferritin  Once,   R     Signed and Held   Signed and Held  Iron and TIBC  Once,   R     Signed and Held   Signed and Held  Vitamin B12  Once,   R     Signed and Held   Signed and Held  Folate RBC  Once,   R     Signed and Held          Vitals/Pain Today's Vitals   04/15/18 1500 04/15/18 1605 04/15/18 1635 04/15/18 1712  BP: (!) 141/85 (!) 147/71 (!) 153/74 (!) 151/78  Pulse: 98 (!) 106 (!) 102 (!) 102  Resp: 20 20 (!) 24 (!) 24  Temp:   97.8 F (36.6 C) 98.4 F (36.9 C)  TempSrc:   Oral Oral  SpO2: 100% 99% 94% 94%  PainSc:        Isolation Precautions No active isolations  Medications Medications  0.9 %  sodium chloride infusion (Manually program via Guardrails IV Fluids) (has no administration in time range)    Mobility walks

## 2018-04-15 NOTE — Consult Note (Signed)
CROSS COVER [facility] Reason for Consult: Severe anemia/black stool. Referring Physician:THP.  Gregory Nunez is an 72 y.o. male.  HPI: [name] is a 72 year old black male with multiple medical problems listed below, who presented to the emergency room with worsening shortness of breath with exertion and fatigue. He was found to have a hemoglobin of 5.3 g/dL and a repeat hemoglobin of 6.5 g/dL. He was found to be FOBT negative by the ER physician. Patient has been melena off and on for a few weeks now. He denies having any hematochezia. There is no history of fever, chills, rigors, nausea, vomiting or chest pain. Patient was given 2 units of packed red blood cells on admission and GI consultation was procured for further evaluation and treatment. Patient has had a history of COPD with over 40-pack-year history of smoking and a history of prostate cancer resulting in a prostatectomy 2 years ago. He's also had a colonoscopy with multiple polyps removed by Dr. Nicki Guadalajara in April 2018. Patient has been using a lot of Goody powders lately aches and pain. He denies a previous history of peptic ulcer disease. He has a history of reflux but denies having any odynophagia or dysphagia. He has chronic constipation and can go up to 1 week without a BM. He is scheduled to have a cholecystectomy on 04/19/2018 for cholelithiasis. He has had some RUQ pain for about 1 year and the pain is worse post-prandially, especially with the consumption of greasy foods.   Past Medical History:  Diagnosis Date  . Depression   . Diabetes mellitus without complication ([place])   . GERD (gastroesophageal reflux disease)    if drinks alcohol  . 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 Hyperlipidemia   . Hypertension   . Malignant neoplasm of prostate ([place]) 01/09/2014  . Pancreatitis   . 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 Shortness of breath dyspnea    with exertion    Past Surgical History:  Procedure Laterality Date  . biopsy on throat     hx of   . FOOT SURGERY    . LYMPHADENECTOMY Bilateral 02/27/2014   Procedure: BILATERAL LYMPHADENECTOMY;  Surgeon: Alexis Frock, MD;  Location: [facility];  Service: Urology;  Laterality: Bilateral;  . PROSTATE BIOPSY  12/2013   Gleason 4+3=7, volume 31.31 cc  . ROBOT ASSISTED LAPAROSCOPIC RADICAL PROSTATECTOMY N/A 02/27/2014   Procedure: ROBOTIC ASSISTED LAPAROSCOPIC RADICAL PROSTATECTOMY WITH INDOCYANINE GREEN DYE;  Surgeon: Alexis Frock, MD;  Location: [facility];  Service: Urology;  Laterality: N/A;   Family History  Problem Relation Age of Onset  . Heart disease Mother   . Cancer Sister        breast  . Colon cancer Neg Hx   . Esophageal cancer Neg Hx   . Rectal cancer Neg Hx   . Stomach cancer Neg Hx    Social History:  reports that he has been smoking cigarettes. He has a 20.00 pack-year smoking history. He has never used smokeless tobacco. He reports previous drug use. Drugs: Marijuana and Cocaine. He reports that he does not drink alcohol.  Allergies:  Allergies  Allergen Reactions  . Penicillins Anxiety and Other (See Comments)    DID THE REACTION INVOLVE: Swelling of the face/tongue/throat, SOB, or low BP? No Sudden or severe rash/hives, skin peeling, or the inside of the mouth or nose? No Did it  require medical treatment? No When did it last happen?many years ago If all above answers are "NO", may proceed with cephalosporin use.      Medications: I have reviewed the patient's current medications.  Results for orders placed or performed during the hospital encounter of 04/15/18 (from the past 48 hour(s))  Basic metabolic panel     Status: Abnormal   Collection Time: 04/15/18  1:51 PM  Result Value Ref Range   Sodium 139 135 - 145 mmol/L   Potassium 4.0 3.5 - 5.1 mmol/L   Chloride 110 98 - 111 mmol/L   CO2 24 22 - 32 mmol/L    Glucose, Bld 164 (H) 70 - 99 mg/dL   BUN 21 8 - 23 mg/dL   Creatinine, Ser 1.02 0.61 - 1.24 mg/dL   Calcium 8.2 (L) 8.9 - 10.3 mg/dL   GFR calc non Af Amer >60 >60 mL/min   GFR calc Af Amer >60 >60 mL/min   Anion gap 5 5 - 15    Comment: Performed at [facility], [place] [address], [place], [place] [address]  CBC     Status: Abnormal   Collection Time: 04/15/18  1:51 PM  Result Value Ref Range   WBC 4.3 4.0 - 10.5 K/uL   RBC 2.07 (L) 4.22 - 5.81 MIL/uL   Hemoglobin 5.3 (LL) 13.0 - 17.0 g/dL    Comment: REPEATED TO VERIFY THIS CRITICAL RESULT HAS VERIFIED AND BEEN CALLED TO [facility],S BY BILLY HOOKER ON 12 28 2019 AT 1416, AND HAS BEEN READ BACK. CRITICAL VERIFIED    HCT 18.8 (L) 39.0 - 52.0 %   MCV 90.8 80.0 - 100.0 fL   MCH 25.6 (L) 26.0 - 34.0 pg   MCHC 28.2 (L) 30.0 - 36.0 g/dL   RDW 16.1 (H) 11.5 - 15.5 %   Platelets 158 150 - 400 K/uL   nRBC 0.0 0.0 - 0.2 %    Comment: Performed at [facility], [place] [address], [place], [place] [address]  I-stat troponin, ED     Status: None   Collection Time: 04/15/18  1:56 PM  Result Value Ref Range   Troponin i, poc 0.01 0.00 - 0.08 ng/mL   Comment 3            Comment: Due to the release kinetics of cTnI, a negative result within the first hours of the onset of symptoms does not rule out myocardial infarction with certainty. If myocardial infarction is still suspected, repeat the test at appropriate intervals.   Type and screen [place]     Status: None (Preliminary result)   Collection Time: 04/15/18  2:32 PM  Result Value Ref Range   ABO/RH(D) O POS    Antibody Screen NEG    Sample Expiration 04/18/2018    Unit Number [id number]    Blood Component Type RED CELLS,LR    Unit division 00    Status of Unit ISSUED    Transfusion Status OK TO TRANSFUSE    Crossmatch Result      Compatible Performed at [facility], [place] [address], [place],  [place] [address]    Unit Number [id number]    Blood Component Type RED CELLS,LR    Unit division 00    Status of Unit ALLOCATED    Transfusion Status OK TO TRANSFUSE    Crossmatch Result Compatible   Vitamin B12     Status: None   Collection Time: 04/15/18  2:32  PM  Result Value Ref Range   Vitamin B-12 292 180 - 914 pg/mL    Comment: (NOTE) This assay is not validated for testing neonatal or myeloproliferative syndrome specimens for Vitamin B12 levels. Performed at [facility], [place] [address] [place], [place] [address]   Folate     Status: None   Collection Time: 04/15/18  2:32 PM  Result Value Ref Range   Folate 16.6 >5.9 ng/mL    Comment: Performed at [facility], [place] [address] [place], [place] [address]  Iron and TIBC     Status: Abnormal   Collection Time: 04/15/18  2:32 PM  Result Value Ref Range   Iron 12 (L) 45 - 182 ug/dL   TIBC 560 (H) 250 - 450 ug/dL   Saturation Ratios 2 (L) 17.9 - 39.5 %   UIBC 548 ug/dL    Comment: Performed at [facility], [place] [address] [place], [place] [address]  Ferritin     Status: Abnormal   Collection Time: 04/15/18  2:32 PM  Result Value Ref Range   Ferritin 7 (L) 24 - 336 ng/mL    Comment: Performed at [facility], [place] [address], [place], [place] [address]  Reticulocytes     Status: Abnormal   Collection Time: 04/15/18  2:32 PM  Result Value Ref Range   Retic Ct Pct 3.7 (H) 0.4 - 3.1 %   RBC. 2.11 (L) 4.22 - 5.81 MIL/uL   Retic Count, Absolute 77.4 19.0 - 186.0 K/uL   Immature Retic Fract 35.0 (H) 2.3 - 15.9 %    Comment: Performed at [facility], [place] [address] [place], [place] [address]  Differential     Status: None   Collection Time: 04/15/18  2:32 PM  Result Value Ref Range   Neutrophils Relative % 50 %   Neutro Abs 2.3 1.7 - 7.7 K/uL   Lymphocytes Relative 38 %   Lymphs Abs 1.8 0.7 - 4.0 K/uL   Monocytes Relative 10 %    Monocytes Absolute 0.5 0.1 - 1.0 K/uL   Eosinophils Relative 2 %   Eosinophils Absolute 0.1 0.0 - 0.5 K/uL   Basophils Relative 0 %   Basophils Absolute 0.0 0.0 - 0.1 K/uL   Immature Granulocytes 0 %   Abs Immature Granulocytes 0.01 0.00 - 0.07 K/uL   Polychromasia PRESENT     Comment: Performed at [facility], [place] [address] [place], [place] [address]  ABO/Rh     Status: None (Preliminary result)   Collection Time: 04/15/18  2:32 PM  Result Value Ref Range   ABO/RH(D)      O POS Performed at [facility], [place] [address] [place], [place] [address]   Occult blood, poc device     Status: None   Collection Time: 04/15/18  2:43 PM  Result Value Ref Range   Fecal Occult Bld NEGATIVE NEGATIVE  Prepare RBC     Status: None   Collection Time: 04/15/18  2:45 PM  Result Value Ref Range   Order Confirmation      ORDER PROCESSED BY BLOOD BANK Performed at [place] [address], [place], [place] [address]   I-STAT, [place] Clap 8     Status: Abnormal   Collection Time: 04/15/18  3:04 PM  Result Value Ref Range   Sodium 139 135 - 145 mmol/L   Potassium 4.3 3.5 - 5.1 mmol/L   Chloride 109  98 - 111 mmol/L   BUN 21 8 - 23 mg/dL   Creatinine, Ser 1.00 0.61 - 1.24 mg/dL   Glucose, Bld 127 (H) 70 - 99 mg/dL   Calcium, Ion 1.11 (L) 1.15 - 1.40 mmol/L   TCO2 25 22 - 32 mmol/L   Hemoglobin 6.5 (LL) 13.0 - 17.0 g/dL   HCT 19.0 (L) 39.0 - 52.0 %   Comment NOTIFIED PHYSICIAN     Dg Chest 2 View  Result Date: 04/15/2018 CLINICAL DATA:  Dyspnea on exertion EXAM: CHEST - 2 VIEW COMPARISON:  1919 FINDINGS: Top-normal heart size. Nonaneurysmal thoracic aorta. No acute pulmonary consolidation or overt pulmonary edema. Effusion or pneumothorax. IMPRESSION: No active cardiopulmonary disease. Electronically Signed   By: Ashley Royalty M.D.   On: 04/15/2018 15:45   Review of Systems  Constitutional: Positive for diaphoresis and malaise/fatigue.  Negative for chills and fever.  HENT: Negative.   Eyes: Negative.   Respiratory: Positive for shortness of breath.   Cardiovascular: Negative.   Gastrointestinal: Positive for abdominal pain, blood in stool, constipation and heartburn. Negative for diarrhea, nausea and vomiting.  Genitourinary: Negative.   Musculoskeletal: Positive for joint pain and myalgias.  Skin: Negative.   Neurological: Negative.   Endo/Heme/Allergies: Negative.   Psychiatric/Behavioral: Negative.    Blood pressure (!) 151/78, pulse (!) 102, temperature 98.4 F (36.9 C), temperature source Oral, resp. rate (!) 24, SpO2 94 %. Physical Exam  Constitutional: He is oriented to person, place, and time. He appears well-developed and well-nourished.  HENT:  Head: Normocephalic and atraumatic.  Eyes: Pupils are equal, round, and reactive to light. Conjunctivae and EOM are normal.  Neck: Normal range of motion. Neck supple.  Cardiovascular: Normal rate and regular rhythm.  Respiratory: Effort normal and breath sounds normal.  GI: Soft. Bowel sounds are normal.  Musculoskeletal: Normal range of motion.  Neurological: He is alert and oriented to person, place, and time.  Skin: Skin is warm and dry.  Psychiatric: He has a normal mood and affect. His behavior is normal. Judgment and thought content normal.   Assessment/Plan: 1) Severe iron deficiencyanemia/GERD/Melena-patient will need an EGD to rule out peptic ulcer disease. He needs to avoid the use of all nonsteroidals at the present time. Continue IV PPIs 2) Cholelithiasis on a recent CT scan-laparoscopic cholecystectomy planned for 04/19/2018. 3) Chronic constipation/-Trial of Miralax advised.  4) Personal history of multiple colonic polyps removed in April 2018. 5) Hypertension/Hyperlipidemia.[place] 6) History of prostate cancer status post prostatectomy [place] Tat 04/15/2018, 6:00 PM

## 2018-04-15 NOTE — Progress Notes (Signed)
Pt c/o dyspnea stating "I can't breathe". RT called to bedside, 2nd unit of blood stopped, Bodenheimer, PA called. CXR ordered, duoneb administered. 2LNC applied, and lasix ordered. Pt states symptoms have improved. Pt has audible crackles throughout.

## 2018-04-15 NOTE — Progress Notes (Signed)
Bodenheimer PA updated on pt's status. Pt's VS 184/89 HR 114 pt required increase O2 4LNC sating 94%. CXR resulted showing mild pulmonary edema.  Will resume transfusion per Bodenheimer at a slow rate and will d/c transfusion if pt becomes symptomatic again. Will continue to monitor BP closely and notify provider if prn hydralazine is needed.

## 2018-04-15 NOTE — ED Provider Notes (Signed)
[facility] Provider Note   CSN: [id number] Arrival date & time: 04/15/18  1320     History   Chief Complaint Chief Complaint  Patient presents with  . Leg Pain  . Shortness of Breath    HPI Gregory Nunez is a 72 y.o. male.  HPI   72yo male with history of DM, htn, hlpd, pancreatitis, prostate cancer, undergoing outpatient evaluation of gallstones with plan for cholecystectomy and preop labs ordered 12/23 who presents with concern for dyspnea on exertion, chest tightness and leg pain on exertion.  Reports over the last week he has walked very short distances and started to feel severe aching burning in his legs and a warmth, tightness, burning in his chest. Feels short of breath with walking.  At rest, does not have symptoms. No dizziness. Denies nausea, vomiting or diarrhea. Denies current abdominal pain (had been seein in Sept for abdominal pain, epigastric) and denies back or flank pain. Reports a chronic tingling and cramping in his left leg which is present for years since he had a bunion surgery and that keeps him from sleep.  Reports he will have occasional black colored stool, sometimes parts will be normal brown and will appear black at the end.   Past Medical History:  Diagnosis Date  . Depression   . Diabetes mellitus without complication ([place])   . GERD (gastroesophageal reflux disease)    if drinks alcohol  . 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 Hyperlipidemia   . Hypertension   . Malignant neoplasm of prostate ([place]) 01/09/2014  . Pancreatitis   . Prostate cancer ([place]) 12/19/13   Gleason 4+3=7, volume 31.31 cc  . Shortness of breath dyspnea    with exertion     Patient Active Problem List   Diagnosis Date Noted  . Symptomatic anemia 04/15/2018  . Prostate cancer ([place]) 02/27/2014  . Malignant neoplasm of prostate ([place]) 01/09/2014  . Bunion  01/17/2013  . 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 biopsy on throat     hx of   . FOOT SURGERY    . LYMPHADENECTOMY Bilateral 02/27/2014   Procedure: BILATERAL LYMPHADENECTOMY;  Surgeon: Alexis Frock, MD;  Location: [facility];  Service: Urology;  Laterality: Bilateral;  . PROSTATE BIOPSY  12/2013   Gleason 4+3=7, volume 31.31 cc  . ROBOT ASSISTED LAPAROSCOPIC RADICAL PROSTATECTOMY N/A 02/27/2014   Procedure: ROBOTIC ASSISTED LAPAROSCOPIC RADICAL PROSTATECTOMY WITH INDOCYANINE GREEN DYE;  Surgeon: Alexis Frock, MD;  Location: [facility];  Service: Urology;  Laterality: N/A;        Home Medications    Prior to Admission medications   Medication Sig Start Date End Date Taking? Authorizing Provider  amLODipine (NORVASC) 5 MG tablet Take 5 mg by mouth every evening.   Yes [provider]  glimepiride (AMARYL) 4 MG tablet Take 4 mg by mouth every evening.    Yes [provider]  losartan (COZAAR) 100 MG tablet Take 100 mg by mouth every evening.    Yes [provider]  nicotine (NICODERM CQ - DOSED IN MG/24 HOURS) 21 mg/24hr patch Place 21 mg onto the skin daily.   Yes [provider]  traZODone (DESYREL) 50 MG tablet Take 50 mg by mouth at bedtime as needed for sleep.   Yes [provider]    Family History Family History  Problem Relation Age of Onset  . Heart disease Mother   . Cancer Sister        breast  . Colon cancer Neg Hx   . Esophageal cancer Neg Hx   . Rectal cancer Neg Hx   . Stomach cancer Neg Hx     Social History Social History   Tobacco Use  . Smoking status: Current Some Day Smoker    Packs/day: 0.50    Years: 40.00    Pack years: 20.00    Types: Cigarettes  . Smokeless tobacco: Never Used  . Tobacco comment: smokes a couple every day or two  Substance Use Topics  . Alcohol use: No    Comment: Patient states no alcohol in 2 years.   . Drug use: Not  Currently    Types: Marijuana, Cocaine    Comment: past hx approx 30 years ago      Allergies   Penicillins   Review of Systems Review of Systems  Constitutional: Negative for fever.  HENT: Negative for sore throat.   Eyes: Negative for visual disturbance.  Respiratory: Positive for shortness of breath.   Cardiovascular: Positive for chest pain. Negative for leg swelling.  Gastrointestinal: Negative for abdominal pain, constipation, diarrhea, nausea and vomiting. Blood in stool: occasional black stool.  Genitourinary: Negative for difficulty urinating.  Musculoskeletal: Negative for back pain and neck stiffness.  Skin: Negative for rash.  Neurological: Negative for syncope and headaches.     Physical Exam ED Triage Vitals  Enc Vitals Group     BP 04/15/18 1334 (!) 148/75     Pulse Rate 04/15/18 1334 94     Resp 04/15/18 1334 16     Temp 04/15/18 1334 98.3 F (36.8 C)     Temp Source 04/15/18 1334 Oral     SpO2 04/15/18 1334 100 %     Weight 04/15/18 1857 179 lb (81.2 kg)     Height 04/15/18 1856 5\' 11"  (1.803 m)     Head Circumference --      Peak Flow --      Pain Score 04/15/18 1334 6     Pain Loc --      Pain Edu? --      Excl. in [place]? --     Physical Exam Vitals signs and nursing note reviewed.  Constitutional:      General: He is not in acute distress.    Appearance: He is well-developed. He is not diaphoretic.  HENT:     Head: Normocephalic and atraumatic.  Eyes:     Conjunctiva/sclera: Conjunctivae normal.  Neck:     Musculoskeletal: Normal range of motion.  Cardiovascular:     Rate and Rhythm: Normal rate and regular rhythm.     Heart sounds: Normal heart sounds. No murmur. No friction rub. No gallop.   Pulmonary:     Effort: Pulmonary effort is normal. No respiratory distress.     Breath sounds: Normal breath sounds. No wheezing or rales.  Abdominal:     General: There is no distension.     Palpations: Abdomen is soft.     Tenderness: There is  no abdominal tenderness. There is no guarding.  Skin:    General: Skin is warm and dry.  Neurological:     Mental Status: He is alert and oriented to person, place, and time.      ED Treatments / Results  Labs (all labs ordered are listed, but only abnormal results are displayed) Labs Reviewed  BASIC METABOLIC PANEL - Abnormal; Notable for the following components:      Result Value   Glucose, Bld 164 (*)    Calcium 8.2 (*)    All other components within normal limits  CBC - Abnormal; Notable for the following components:   RBC 2.07 (*)    Hemoglobin 5.3 (*)    HCT 18.8 (*)    MCH 25.6 (*)    MCHC 28.2 (*)    RDW 16.1 (*)    All other components within normal limits  IRON AND TIBC - Abnormal; Notable for the following components:   Iron 12 (*)    TIBC 560 (*)    Saturation Ratios 2 (*)    All other components within normal limits  FERRITIN - Abnormal; Notable for the following components:   Ferritin 7 (*)    All other components within normal limits  RETICULOCYTES - Abnormal; Notable for the following components:   Retic Ct Pct 3.7 (*)    RBC. 2.11 (*)    Immature Retic Fract 35.0 (*)    All other components within normal limits  URINALYSIS, COMPLETE (UACMP) WITH MICROSCOPIC - Abnormal; Notable for the following components:   Color, Urine COLORLESS (*)    Specific Gravity, Urine 1.004 (*)    Protein, ur 30 (*)    All other components within normal limits  POCT I-STAT, CHEM 8 - Abnormal; Notable for the following components:   Glucose, Bld 127 (*)    Calcium, Ion 1.11 (*)    Hemoglobin 6.5 (*)    HCT 19.0 (*)    All other components within normal limits  VITAMIN B12  FOLATE  DIFFERENTIAL  TROPONIN I  TROPONIN I  BRAIN NATRIURETIC PEPTIDE  HIV ANTIBODY (ROUTINE TESTING W REFLEX)  MAGNESIUM  PHOSPHORUS  TSH  HEMOGLOBIN A1C  OCCULT BLOOD X 1 CARD TO LAB, STOOL  BASIC METABOLIC PANEL  CBC  FERRITIN  IRON AND TIBC  VITAMIN B12  FOLATE RBC  I-STAT  TROPONIN, ED  I-STAT CHEM 8, ED  POC OCCULT BLOOD, ED  OCCULT BLOOD, POC DEVICE  TYPE AND SCREEN  PREPARE RBC (CROSSMATCH)  ABO/RH    EKG EKG Interpretation  Date/Time:  Saturday April 15 2018 13:44:34 EST Ventricular Rate:  94 PR Interval:    QRS Duration: 101 QT Interval:  365 QTC Calculation: 457 R Axis:   25 Text Interpretation:  Sinus rhythm RSR' in V1 or V2, right VCD or RVH Nonspecific T abnormalities, lateral leads No significant change since last tracing Confirmed by Gareth Morgan [phone]) on 04/15/2018 3:38:28 PM   Radiology Dg Chest 2 View  Result Date: 04/15/2018 CLINICAL DATA:  Dyspnea on exertion EXAM: CHEST - 2 VIEW COMPARISON:  1919 FINDINGS: Top-normal heart size. Nonaneurysmal thoracic aorta. No acute pulmonary consolidation or overt pulmonary edema. Effusion or pneumothorax. IMPRESSION: No active cardiopulmonary disease. Electronically Signed   By: Ashley Royalty M.D.   On: 04/15/2018 15:45   Dg Chest Port 1 View  Result Date: 04/15/2018 CLINICAL DATA:  Shortness of breath. EXAM: PORTABLE CHEST 1 VIEW COMPARISON:  04/15/2018 at 1519 hours FINDINGS: The cardiomediastinal silhouette is unchanged with upper limits of normal heart size. There is central pulmonary vascular congestion with diffusely increased interstitial markings, most notable in the lung bases and increased from the prior study. No sizable pleural effusion or pneumothorax is identified. No acute osseous abnormality is seen. IMPRESSION: Central pulmonary vascular congestion with increased interstitial compatible with mild edema. Electronically Signed   By:  Logan Bores M.D.   On: 04/15/2018 22:03    Procedures .Critical Care Performed by: Gareth Morgan, MD Authorized by: Gareth Morgan, MD   Critical care provider statement:    Critical care time (minutes):  30   Critical care was time spent personally by me on the following activities:  Interpretation of cardiac output measurements,  examination of patient, development of treatment plan with patient or surrogate, ordering and review of laboratory studies, ordering and performing treatments and interventions and re-evaluation of patient's condition   (including critical care time)  Medications Ordered in ED Medications  amLODipine (NORVASC) tablet 5 mg (5 mg Oral Given 04/15/18 2035)  losartan (COZAAR) tablet 100 mg (100 mg Oral Given 04/15/18 2026)  nicotine (NICODERM CQ - dosed in mg/24 hours) patch 21 mg (21 mg Transdermal Patch Applied 04/15/18 2027)  traZODone (DESYREL) tablet 50 mg (50 mg Oral Given 04/15/18 2026)  pantoprazole (PROTONIX) injection 40 mg (40 mg Intravenous Given 04/15/18 2026)  insulin aspart (novoLOG) injection 0-9 Units (has no administration in time range)  budesonide (PULMICORT) nebulizer solution 0.25 mg (0.25 mg Nebulization Given 04/15/18 2018)  ipratropium-albuterol (DUONEB) 0.5-2.5 (3) MG/3ML nebulizer solution 3 mL (3 mLs Nebulization Given 04/15/18 2018)  ipratropium-albuterol (DUONEB) 0.5-2.5 (3) MG/3ML nebulizer solution 3 mL (3 mLs Nebulization Given 04/15/18 2123)  pneumococcal 23 valent vaccine (PNU-IMMUNE) injection 0.5 mL (has no administration in time range)  alum & mag hydroxide-simeth (MAALOX/MYLANTA) 200-200-20 MG/5ML suspension 30 mL (30 mLs Oral Given 04/15/18 2019)  hydrALAZINE (APRESOLINE) injection 10 mg (10 mg Intravenous Given 04/15/18 2224)  0.9 %  sodium chloride infusion (Manually program via Guardrails IV Fluids) ( Intravenous New Bag/Given 04/15/18 1857)  cyclobenzaprine (FLEXERIL) tablet 5 mg (5 mg Oral Given 04/15/18 2019)  furosemide (LASIX) injection 60 mg (60 mg Intravenous Given 04/15/18 2127)     Initial Impression / Assessment and Plan / ED Course  I have reviewed the triage vital signs and the nursing notes.  Pertinent labs & imaging results that were available during my care of the patient were reviewed by me and considered in my medical decision making  (see chart for details).      72yo male with history of DM, htn, hlpd, pancreatitis, prostate cancer, undergoing outpatient evaluation of gallstones with plan for cholecystectomy and preop labs ordered 12/23 who presents with concern for dyspnea on exertion, chest tightness and leg pain on exertion.   Labs significant for hemoglobin of 5.3.  VItal signs stable on arrival, patient well appearing and asymptomatic at rest.  Noted to have hgb 6.6 on 12/23 preop labs, however was 12.8 in September.  Anemia panel ordered.  By history, reports some black stool but on my exam has no melena, and hemoccult is negative. Consider possible intermittent or chronic GI bleed, and given prior epigastric pain would consider gastric ulcer on differential, however patietn without signs of acute clinically significant GI bleed at this time.  Denies history of trauma, denies back pain, denies abdominal pain, doubt acute intraabdominal bleed from prior pancreatitis, AAA, or other retroperitoneal bleed.  He reports left leg numbness/cramping symptoms have been chronic for years, do not feel this represents sequela of retroperitoneal bleed.  He otherwise denies bleeding from other locations.  Symptoms of chest pain, dyspnea and leg pain on exertion likely secondary to severe anemia. Troponin negative.  Ordered 2U pRBC and will admit for continued work up.      Final Clinical Impressions(s) / ED Diagnoses   Final diagnoses:  Symptomatic anemia  Other fatigue    ED Discharge Orders    None       Gareth Morgan, MD 04/15/18 2322

## 2018-04-15 NOTE — ED Triage Notes (Signed)
Pt c/o DOE and bilateral leg weakness x 1 week. Pt states when he is at rest he is asymptomatic but when he walks he becomes short of breath, chest feels "hot" and legs begin cramping and wekening with distance. Pt also states he has lost a lot of sleep due to leg cramping.

## 2018-04-16 ENCOUNTER — Observation Stay (HOSPITAL_COMMUNITY): Payer: Medicare HMO | Admitting: Certified Registered"

## 2018-04-16 ENCOUNTER — Encounter (HOSPITAL_COMMUNITY)
Admission: EM | Disposition: A | Discharge status: Home / Self Care | Payer: Self-pay | Source: Home / Self Care | Attending: Emergency Medicine

## 2018-04-16 ENCOUNTER — Encounter (HOSPITAL_COMMUNITY): Payer: Self-pay | Admitting: Certified Registered"

## 2018-04-16 DIAGNOSIS — D649 Anemia, unspecified: Secondary | ICD-10-CM | POA: Diagnosis not present

## 2018-04-16 DIAGNOSIS — K31819 Angiodysplasia of stomach and duodenum without bleeding: Secondary | ICD-10-CM | POA: Diagnosis not present

## 2018-04-16 DIAGNOSIS — R0602 Shortness of breath: Secondary | ICD-10-CM | POA: Diagnosis not present

## 2018-04-16 DIAGNOSIS — K2950 Unspecified chronic gastritis without bleeding: Secondary | ICD-10-CM | POA: Diagnosis not present

## 2018-04-16 DIAGNOSIS — D509 Iron deficiency anemia, unspecified: Secondary | ICD-10-CM | POA: Diagnosis not present

## 2018-04-16 DIAGNOSIS — K921 Melena: Secondary | ICD-10-CM | POA: Diagnosis not present

## 2018-04-16 HISTORY — PX: HOT HEMOSTASIS: SHX5433

## 2018-04-16 HISTORY — PX: ESOPHAGOGASTRODUODENOSCOPY: SHX5428

## 2018-04-16 HISTORY — PX: BIOPSY: SHX5522

## 2018-04-16 LAB — CBC
HCT: 24.5 % — ABNORMAL LOW (ref 39.0–52.0)
Hemoglobin: 7.6 g/dL — ABNORMAL LOW (ref 13.0–17.0)
MCH: 27 pg (ref 26.0–34.0)
MCHC: 31 g/dL (ref 30.0–36.0)
MCV: 87.2 fL (ref 80.0–100.0)
Platelets: 172 10*3/uL (ref 150–400)
RBC: 2.81 MIL/uL — ABNORMAL LOW (ref 4.22–5.81)
RDW: 15.4 % (ref 11.5–15.5)
WBC: 7.6 10*3/uL (ref 4.0–10.5)
nRBC: 0.3 % — ABNORMAL HIGH (ref 0.0–0.2)

## 2018-04-16 LAB — BASIC METABOLIC PANEL
Anion gap: 9 (ref 5–15)
BUN: 15 mg/dL (ref 8–23)
CO2: 22 mmol/L (ref 22–32)
Calcium: 8.2 mg/dL — ABNORMAL LOW (ref 8.9–10.3)
Chloride: 107 mmol/L (ref 98–111)
Creatinine, Ser: 0.98 mg/dL (ref 0.61–1.24)
GFR calc Af Amer: >60 mL/min (ref >60)
GFR calc non Af Amer: >60 mL/min (ref >60)
Glucose, Bld: 145 mg/dL — ABNORMAL HIGH (ref 70–99)
Potassium: 3.5 mmol/L (ref 3.5–5.1)
Sodium: 138 mmol/L (ref 135–145)

## 2018-04-16 LAB — BRAIN NATRIURETIC PEPTIDE: B Natriuretic Peptide: 123.8 pg/mL — ABNORMAL HIGH (ref 0.0–100.0)

## 2018-04-16 LAB — PHOSPHORUS: Phosphorus: 3.2 mg/dL (ref 2.5–4.6)

## 2018-04-16 LAB — VITAMIN B12: Vitamin B-12: 301 pg/mL (ref 180–914)

## 2018-04-16 LAB — GLUCOSE, CAPILLARY
Glucose-Capillary: 130 mg/dL — ABNORMAL HIGH (ref 70–99)
Glucose-Capillary: 133 mg/dL — ABNORMAL HIGH (ref 70–99)
Glucose-Capillary: 157 mg/dL — ABNORMAL HIGH (ref 70–99)

## 2018-04-16 LAB — HEMOGLOBIN A1C
Hgb A1c MFr Bld: 5.6 % (ref 4.8–5.6)
Mean Plasma Glucose: 114.02 mg/dL

## 2018-04-16 LAB — IRON AND TIBC
Iron: 44 ug/dL — ABNORMAL LOW (ref 45–182)
Saturation Ratios: 7 % — ABNORMAL LOW (ref 17.9–39.5)
TIBC: 608 ug/dL — ABNORMAL HIGH (ref 250–450)
UIBC: 564 ug/dL

## 2018-04-16 LAB — TROPONIN I
Troponin I: <0.03 ng/mL (ref <0.03)
Troponin I: <0.03 ng/mL (ref <0.03)

## 2018-04-16 LAB — MAGNESIUM: Magnesium: 1.8 mg/dL (ref 1.7–2.4)

## 2018-04-16 LAB — FERRITIN: Ferritin: 7 ng/mL — ABNORMAL LOW (ref 24–336)

## 2018-04-16 LAB — TSH: TSH: 2.49 u[IU]/mL (ref 0.350–4.500)

## 2018-04-16 SURGERY — EGD (ESOPHAGOGASTRODUODENOSCOPY)
Anesthesia: Monitor Anesthesia Care

## 2018-04-16 MED ORDER — PROPOFOL 10 MG/ML IV BOLUS
INTRAVENOUS | Status: AC
  Filled 2018-04-16: qty 40

## 2018-04-16 MED ORDER — CYCLOBENZAPRINE HCL 5 MG PO TABS
5.0000 mg | ORAL_TABLET | Freq: Once | ORAL | Status: AC
  Administered 2018-04-16: 5 mg via ORAL
  Filled 2018-04-16: qty 1

## 2018-04-16 MED ORDER — LIDOCAINE 2% (20 MG/ML) 5 ML SYRINGE
INTRAMUSCULAR | Status: DC | PRN
  Administered 2018-04-16: 60 mg via INTRAVENOUS

## 2018-04-16 MED ORDER — ONDANSETRON HCL 4 MG/2ML IJ SOLN
INTRAMUSCULAR | Status: DC | PRN
  Administered 2018-04-16: 4 mg via INTRAVENOUS

## 2018-04-16 MED ORDER — METHOCARBAMOL 500 MG PO TABS
500.0000 mg | ORAL_TABLET | Freq: Once | ORAL | Status: AC
  Administered 2018-04-16: 500 mg via ORAL
  Filled 2018-04-16: qty 1

## 2018-04-16 MED ORDER — POTASSIUM CHLORIDE CRYS ER 20 MEQ PO TBCR
40.0000 meq | EXTENDED_RELEASE_TABLET | Freq: Once | ORAL | Status: AC
  Administered 2018-04-16: 40 meq via ORAL
  Filled 2018-04-16: qty 2

## 2018-04-16 MED ORDER — SODIUM CHLORIDE 0.9 % IV SOLN: INTRAVENOUS | Status: DC

## 2018-04-16 MED ORDER — SODIUM CHLORIDE 0.9 % IV SOLN
510.0000 mg | Freq: Once | INTRAVENOUS | Status: AC
  Administered 2018-04-16: 510 mg via INTRAVENOUS
  Filled 2018-04-16: qty 17

## 2018-04-16 MED ORDER — LACTATED RINGERS IV SOLN
INTRAVENOUS | Status: DC | PRN
  Administered 2018-04-16: 11:00:00 via INTRAVENOUS

## 2018-04-16 MED ORDER — PROPOFOL 10 MG/ML IV BOLUS
INTRAVENOUS | Status: DC | PRN
  Administered 2018-04-16 (×5): 50 mg via INTRAVENOUS

## 2018-04-16 NOTE — Op Note (Signed)
[facility] Patient Name: Gregory Nunez Procedure Date: 04/16/2018 MRN: [id number] Attending MD: Juanita Craver , MD Date of Birth: 1945-12-31 CSN: [id number] Age: 72 Admit Type: Inpatient Procedure:                EGD with antral biopsies & ablation of duodenal                            bulb AVM. Indications:              Iron deficiency anemia Providers:                Juanita Craver, MD, Elna Breslow, RN, Laverda Sorenson,                            Technician, Glenis Smoker, CRNA Referring MD:             Carlota Raspberry. Havery Moros, MD Medicines:                Monitored Anesthesia Care Complications:            No immediate complications. Estimated Blood Loss:     Estimated blood loss was minimal. Procedure:                Pre-Anesthesia Assessment: - Prior to the                            procedure, a history and physical was performed,                            and patient medications and allergies were                            reviewed. The patient's tolerance of previous                            anesthesia was also reviewed. The risks and                            benefits of the procedure and the sedation options                            and risks were discussed with the patient. All                            questions were answered, and informed consent was                            obtained. Prior Anticoagulants: The patient has                            taken no previous anticoagulant or antiplatelet                            agents. ASA Grade Assessment: III - A patient with  severe systemic disease. After reviewing the risks                            and benefits, the patient was deemed in                            satisfactory condition to undergo the procedure.                            After obtaining informed consent, the endoscope was                            passed under direct vision. Throughout the                procedure, the patient's blood pressure, pulse, and                            oxygen saturations were monitored continuously. The                            [id number]-[id number] ([id number] Olympus adult endoscope was                            introduced through the mouth, and advanced to the                            second part of duodenum. The EGD was accomplished                            without difficulty. The patient tolerated the                            procedure well. Scope In: Scope Out: Findings:      The examined esophagus and the GEJ appeared widely patent and normal.      Diffuse moderate inflammation characterized by erythema, friability,       granularity with a few scattered erosions was found in the entire       examined stomach-biopsies were taken with a cold forceps for histology.      The cardia and gastric fundus were normal on retroflexion.      A single small angiodysplastic lesion without bleeding was found in the       duodenal bulb; fulguration to ablate the lesion by argon plasma at 20       watts was successful.      The first portion of the duodenum and second portion of the duodenum       were normal. Impression:               - Normal appearing, widely patent esophagus and GEJ.                           - Diffuse chronic gastritis with a few                            erosions-biopsies done.                           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                           - Normal first portion of the duodenum and second                            portion of the duodenum. Moderate Sedation:      MAC used. Recommendation:           - Clear liquid diet today; advance as tolerated.                           - Continue present medications.                           - Await pathology results.                            - Serial CBC's.                           - Return to returm to Dr. Doyne Keel office in 2                            weeks.                           - If the patient has any abnormal GI symptoms in                            the interim, he has been advised to call the office                            ASAP for further recommendations. Procedure Code(s):        --- Professional ---                           [phone], Esophagogastroduodenoscopy, flexible,                            transoral; with ablation of tumor(s), polyp(s), or                            other lesion(s) (includes pre- and post-dilation                            and guide wire passage, when performed) Diagnosis Code(s):        --- Professional ---                           D50.9,  Iron deficiency anemia, unspecified                           K31.819, Angiodysplasia of stomach and duodenum                            without bleeding                           K29.50, Unspecified chronic gastritis without                            bleeding CPT copyright 2018 American [employer] Association. All rights reserved. The codes documented in this report are preliminary and upon coder review may  be revised to meet current compliance requirements. Juanita Craver, MD Juanita Craver, MD 04/16/2018 12:01:20 PM This report has been signed electronically. Number of Addenda: 0

## 2018-04-16 NOTE — Transfer of Care (Signed)
Immediate Anesthesia Transfer of Care Note  Patient: [name]  Procedure(s) Performed: ESOPHAGOGASTRODUODENOSCOPY (EGD) (Left ) BIOPSY HOT HEMOSTASIS (ARGON PLASMA COAGULATION/BICAP) (N/A )  Patient Location: PACU and Endoscopy Unit  Anesthesia Type:MAC  Level of Consciousness: sedated  Airway & Oxygen Therapy: Patient Spontanous Breathing and Patient connected to nasal cannula oxygen  Post-op Assessment: Report given to RN and Post -op Vital signs reviewed and stable  Post vital signs: Reviewed and stable  Last Vitals:  Vitals Value Taken Time  BP    Temp    Pulse    Resp 21 04/16/2018 11:53 AM  SpO2    Vitals shown include unvalidated device data.  Last Pain:  Vitals:   04/16/18 1111  TempSrc: Oral  PainSc: 0-No pain      Patients Stated Pain Goal: 4 (36/64/40 3474)  Complications: No apparent anesthesia complications

## 2018-04-16 NOTE — Anesthesia Postprocedure Evaluation (Signed)
Anesthesia Post Note  Patient: Gregory Nunez  Procedure(s) Performed: ESOPHAGOGASTRODUODENOSCOPY (EGD) (Left ) BIOPSY HOT HEMOSTASIS (ARGON PLASMA COAGULATION/BICAP) (N/A )     Patient location during evaluation: PACU Anesthesia Type: MAC Level of consciousness: awake and alert Pain management: pain level controlled Vital Signs Assessment: post-procedure vital signs reviewed and stable Respiratory status: spontaneous breathing, nonlabored ventilation, respiratory function stable and patient connected to nasal cannula oxygen Cardiovascular status: stable and blood pressure returned to baseline Postop Assessment: no apparent nausea or vomiting Anesthetic complications: no    Last Vitals:  Vitals:   04/16/18 1200 04/16/18 1210  BP: (!) 93/46 (!) 98/50  Pulse: 85 88  Resp: (!) 21 (!) 21  Temp:    SpO2: 99% 94%    Last Pain:  Vitals:   04/16/18 1210  TempSrc:   PainSc: 0-No pain                 Tiajuana Amass

## 2018-04-16 NOTE — Anesthesia Procedure Notes (Signed)
Procedure Name: MAC Date/Time: 04/16/2018 11:22 AM Performed by: Cynda Familia, CRNA Pre-anesthesia Checklist: Patient identified, Emergency Drugs available, Suction available, Patient being monitored and Timeout performed Patient Re-evaluated:Patient Re-evaluated prior to induction Oxygen Delivery Method: Nasal cannula Placement Confirmation: positive ETCO2 and breath sounds checked- equal and bilateral Dental Injury: Teeth and Oropharynx as per pre-operative assessment  Comments: Bite block by RN

## 2018-04-16 NOTE — Plan of Care (Signed)
Pt alert and oriented, stable and resting after EGD.  RN will monitor.

## 2018-04-16 NOTE — Anesthesia Preprocedure Evaluation (Signed)
Anesthesia Evaluation  Patient identified by MRN, date of birth, ID band Patient awake    Reviewed: Allergy & Precautions, NPO status , Patient's Chart, lab work & pertinent test results  Airway Mallampati: II  TM Distance: >3 FB Neck ROM: Full    Dental   Pulmonary shortness of breath, Current Smoker,    breath sounds clear to auscultation       Cardiovascular hypertension, Pt. on medications  Rhythm:Regular Rate:Normal     Neuro/Psych negative neurological ROS     GI/Hepatic Neg liver ROS, GERD  ,Melena with anemia   Endo/Other  diabetes, Type 2, Oral Hypoglycemic Agents  Renal/GU negative Renal ROS     Musculoskeletal   Abdominal   Peds  Hematology  (+) anemia ,   Anesthesia Other Findings   Reproductive/Obstetrics                             Lab Results  Component Value Date   WBC 7.6 04/16/2018   HGB 7.6 (L) 04/16/2018   HCT 24.5 (L) 04/16/2018   MCV 87.2 04/16/2018   PLT 172 04/16/2018    Anesthesia Physical Anesthesia Plan  ASA: III  Anesthesia Plan: MAC   Post-op Pain Management:    Induction: Intravenous  PONV Risk Score and Plan: 0 and Propofol infusion, Ondansetron and Treatment may vary due to age or medical condition  Airway Management Planned: Natural Airway and Nasal Cannula  Additional Equipment:   Intra-op Plan:   Post-operative Plan:   Informed Consent: I have reviewed the patients History and Physical, chart, labs and discussed the procedure including the risks, benefits and alternatives for the proposed anesthesia with the patient or authorized representative who has indicated his/her understanding and acceptance.     Plan Discussed with:   Anesthesia Plan Comments:         Anesthesia Quick Evaluation

## 2018-04-16 NOTE — Progress Notes (Signed)
PROGRESS NOTE    Gregory Nunez  MGQ:[id number] DOB: Sep 26, 1945 DOA: 04/15/2018 PCP: [name], MD  Outpatient Specialists:   Brief Narrative:  Patient is a 72 year old African-American male with past medical history significant for diabetes mellitus, hypertension, tobacco use will likely undiagnosed COPD (patient has about 40 pack years), prostate cancer Status post prostatectomy back 2 years ago with subsequent erectile dysfunction and multiple colonic polyps as per colonoscopy done last year by the local GI group amongst other comorbidities.  Patient also has history of Goody powder use, and reports intermittent hematochezia.  Patient presents with 1 week history of severe dyspnea on minimal exertion, shortness of breath, generalized fatigue and weakness of the legs.  Patient was admitted for further assessment and management of symptomatic anemia.  Patient was transfused with 2 units of packed red blood cells.  Patient proceeded with EGD that revealed gastritis.  Patient will be given IV iron (Feraheme), will be discharged on oral iron.  Patient's symptoms have improved significantly.  Input is highly appreciated.  Assessment & Plan:   Active Problems:   Symptomatic anemia  Symptomatic anemia: Source of GI bleed unclear. Possible upper GI bleed. Patient has history of Goody powder use. PPI. However, colonoscopy done last year revealed multiple colonic polyps. Patient is currently being transfused with 2 units of packed red blood cells. Monitor H&H closely. Transfuse as needed. Iron studies. Check folate and B12 level. Consult GI team. Further management will depend on hospital course. 04/16/2018: Patient has been transfused with 2 units of packed red blood cells.  Hemoglobin today 7.6 g/dL.  Iron studies revealed severe iron deficiency.  Will administer IV iron.  We will continue oral iron.  Likely discharge in the morning.  Likely undiagnosed COPD: Nebs Pulmicort Nebs  DuoNeb Will avoid oral IV steroids for now. 04/16/2018: Improved significantly.  Hypertension: Continue to optimize.  Diabetes mellitus: Accu-Cheks AC at bedtime Cover with sliding scale insulin HbA1c 5.6%  History of prostate cancer: Patient follows up with the urology team.   Further management will depend on hospital course.  DVT prophylaxis: SCD Code Status: Full Family Communication:  Disposition Plan: Will depend on hospital course. Consults called: GI.  Discussed with Dr. Juanita Craver  Procedures:   EGD revealed diffuse gastritis.  Antimicrobials:   None.  Subjective: No new complaints. Shortness of breath has improved significantly. No chest pain. No fever chills.  Objective: Vitals:   04/16/18 1210 04/16/18 1226 04/16/18 1305 04/16/18 1348  BP: (!) 98/50 124/73 (!) 147/79   Pulse: 88 (!) 101 99   Resp: (!) 21 16 18    Temp:  97.9 F (36.6 C) 98 F (36.7 C)   TempSrc:  Oral Oral   SpO2: 94% 100% 97% 99%  Weight:      Height:        Intake/Output Summary (Last 24 hours) at 04/16/2018 1415 Last data filed at 04/16/2018 1149 Gross per 24 hour  Intake 1286 ml  Output 4030 ml  Net -2744 ml   Filed Weights   04/15/18 1857  Weight: 81.2 kg    Examination:  General exam: Appears calm and comfortable.  Pale. Respiratory system: Clear to auscultation. Respiratory effort normal. Cardiovascular system: S1 & S2 heard. No pedal edema. Gastrointestinal system: Abdomen is nondistended, soft and nontender. No organomegaly or masses felt. Normal bowel sounds heard. Central nervous system: Alert and oriented. No focal neurological deficits. Extremities: Symmetric 5 x 5 power.   Data Reviewed: I have personally reviewed following labs  and imaging studies  CBC: Recent Labs  Lab 04/10/18 1014 04/15/18 1351 04/15/18 1432 04/15/18 1504 04/16/18 0555  WBC 4.4 4.3  --   --  7.6  NEUTROABS  --   --  2.3  --   --   HGB 6.6* 5.3*  --  6.5* 7.6*    HCT 23.0* 18.8*  --  19.0* 24.5*  MCV 90.9 90.8  --   --  87.2  PLT 184 158  --   --  865   Basic Metabolic Panel: Recent Labs  Lab 04/10/18 1014 04/15/18 1351 04/15/18 1504 04/16/18 0132 04/16/18 0548  NA 140 139 139  --  138  K 3.4* 4.0 4.3  --  3.5  CL 107 110 109  --  107  CO2 25 24  --   --  22  GLUCOSE 89 164* 127*  --  145*  BUN 16 21 21   --  15  CREATININE 0.93 1.02 1.00  --  0.98  CALCIUM 8.8* 8.2*  --   --  8.2*  MG  --   --   --  1.8  --   PHOS  --   --   --  3.2  --    GFR: Estimated Creatinine Clearance: 74.7 mL/min (by C-G formula based on SCr of 0.98 mg/dL). Liver Function Tests: Recent Labs  Lab 04/10/18 1014  AST 38  ALT 30  ALKPHOS 52  BILITOT 0.5  PROT 6.8  ALBUMIN 2.8*   No results for input(s): LIPASE, AMYLASE in the last 168 hours. No results for input(s): AMMONIA in the last 168 hours. Coagulation Profile: No results for input(s): INR, PROTIME in the last 168 hours. Cardiac Enzymes: Recent Labs  Lab 04/16/18 0132 04/16/18 0548  TROPONINI <0.03 <0.03   BNP (last 3 results) No results for input(s): PROBNP in the last 8760 hours. HbA1C: Recent Labs    04/16/18 0132  HGBA1C 5.6   CBG: Recent Labs  Lab 04/10/18 0937 04/16/18 1303  GLUCAP 107* 130*   Lipid Profile: No results for input(s): CHOL, HDL, LDLCALC, TRIG, CHOLHDL, LDLDIRECT in the last 72 hours. Thyroid Function Tests: Recent Labs    04/16/18 0132  TSH 2.490   Anemia Panel: Recent Labs    04/15/18 1432 04/16/18 0132  VITAMINB12 292 301  FOLATE 16.6  --   FERRITIN 7* 7*  TIBC 560* 608*  IRON 12* 44*  RETICCTPCT 3.7*  --    Urine analysis:    Component Value Date/Time   COLORURINE COLORLESS (A) 04/15/2018 2221   APPEARANCEUR CLEAR 04/15/2018 2221   LABSPEC 1.004 (L) 04/15/2018 2221   PHURINE 8.0 04/15/2018 2221   GLUCOSEU NEGATIVE 04/15/2018 2221   HGBUR NEGATIVE 04/15/2018 2221   BILIRUBINUR NEGATIVE 04/15/2018 2221   KETONESUR NEGATIVE 04/15/2018  2221   PROTEINUR 30 (A) 04/15/2018 2221   UROBILINOGEN 1.0 07/04/2012 1842   NITRITE NEGATIVE 04/15/2018 2221   LEUKOCYTESUR NEGATIVE 04/15/2018 2221   Sepsis Labs: @LABRCNTIP (procalcitonin:4,lacticidven:4)  )No results found for this or any previous visit (from the past 240 hour(s)).       Radiology Studies: Dg Chest 2 View  Result Date: 04/15/2018 CLINICAL DATA:  Dyspnea on exertion EXAM: CHEST - 2 VIEW COMPARISON:  1919 FINDINGS: Top-normal heart size. Nonaneurysmal thoracic aorta. No acute pulmonary consolidation or overt pulmonary edema. Effusion or pneumothorax. IMPRESSION: No active cardiopulmonary disease. Electronically Signed   By: Ashley Royalty M.D.   On: 04/15/2018 15:45   Dg Chest Port 1  View  Result Date: 04/15/2018 CLINICAL DATA:  Shortness of breath. EXAM: PORTABLE CHEST 1 VIEW COMPARISON:  04/15/2018 at 1519 hours FINDINGS: The cardiomediastinal silhouette is unchanged with upper limits of normal heart size. There is central pulmonary vascular congestion with diffusely increased interstitial markings, most notable in the lung bases and increased from the prior study. No sizable pleural effusion or pneumothorax is identified. No acute osseous abnormality is seen. IMPRESSION: Central pulmonary vascular congestion with increased interstitial compatible with mild edema. Electronically Signed   By: Logan Bores M.D.   On: 04/15/2018 22:03        Scheduled Meds: . amLODipine  5 mg Oral QPM  . budesonide (PULMICORT) nebulizer solution  0.25 mg Nebulization BID  . insulin aspart  0-9 Units Subcutaneous TID WC  . ipratropium-albuterol  3 mL Nebulization Q6H  . losartan  100 mg Oral QPM  . nicotine  21 mg Transdermal Daily  . pantoprazole (PROTONIX) IV  40 mg Intravenous Q12H  . pneumococcal 23 valent vaccine  0.5 mL Intramuscular Tomorrow-1000   Continuous Infusions: . ferumoxytol       LOS: 0 days    Time spent: 35 minutes    Dana Allan, MD  [facility] Pager #: [phone] 7PM-7AM contact night coverage as above

## 2018-04-16 NOTE — Progress Notes (Signed)
Pt taken off unit to Endo for EGD. Pt stable at this time.

## 2018-04-17 ENCOUNTER — Encounter (HOSPITAL_COMMUNITY): Payer: Self-pay | Admitting: Gastroenterology

## 2018-04-17 DIAGNOSIS — D649 Anemia, unspecified: Secondary | ICD-10-CM | POA: Diagnosis not present

## 2018-04-17 DIAGNOSIS — R0602 Shortness of breath: Secondary | ICD-10-CM | POA: Diagnosis not present

## 2018-04-17 DIAGNOSIS — D509 Iron deficiency anemia, unspecified: Secondary | ICD-10-CM | POA: Diagnosis not present

## 2018-04-17 LAB — RENAL FUNCTION PANEL
Albumin: 2.7 g/dL — ABNORMAL LOW (ref 3.5–5.0)
Anion gap: 6 (ref 5–15)
BUN: 12 mg/dL (ref 8–23)
CO2: 22 mmol/L (ref 22–32)
Calcium: 8.3 mg/dL — ABNORMAL LOW (ref 8.9–10.3)
Chloride: 111 mmol/L (ref 98–111)
Creatinine, Ser: 1.03 mg/dL (ref 0.61–1.24)
GFR calc Af Amer: >60 mL/min (ref >60)
GFR calc non Af Amer: >60 mL/min (ref >60)
Glucose, Bld: 84 mg/dL (ref 70–99)
Phosphorus: 3.3 mg/dL (ref 2.5–4.6)
Potassium: 3.7 mmol/L (ref 3.5–5.1)
Sodium: 139 mmol/L (ref 135–145)

## 2018-04-17 LAB — TYPE AND SCREEN
ABO/RH(D): O POS
Antibody Screen: NEGATIVE
UNIT DIVISION: 0
Unit division: 0

## 2018-04-17 LAB — CBC WITH DIFFERENTIAL/PLATELET
Abs Immature Granulocytes: 0.01 10*3/uL (ref 0.00–0.07)
Basophils Absolute: 0 10*3/uL (ref 0.0–0.1)
Basophils Relative: 0 %
Eosinophils Absolute: 0.1 10*3/uL (ref 0.0–0.5)
Eosinophils Relative: 2 %
HCT: 25 % — ABNORMAL LOW (ref 39.0–52.0)
Hemoglobin: 7.5 g/dL — ABNORMAL LOW (ref 13.0–17.0)
Immature Granulocytes: 0 %
Lymphocytes Relative: 30 %
Lymphs Abs: 1.5 10*3/uL (ref 0.7–4.0)
MCH: 26.6 pg (ref 26.0–34.0)
MCHC: 30 g/dL (ref 30.0–36.0)
MCV: 88.7 fL (ref 80.0–100.0)
Monocytes Absolute: 0.6 10*3/uL (ref 0.1–1.0)
Monocytes Relative: 12 %
Neutro Abs: 2.8 10*3/uL (ref 1.7–7.7)
Neutrophils Relative %: 56 %
Platelets: 172 10*3/uL (ref 150–400)
RBC: 2.82 MIL/uL — ABNORMAL LOW (ref 4.22–5.81)
RDW: 15.6 % — ABNORMAL HIGH (ref 11.5–15.5)
WBC: 5 10*3/uL (ref 4.0–10.5)
nRBC: 0 % (ref 0.0–0.2)

## 2018-04-17 LAB — BPAM RBC
Blood Product Expiration Date: 202001202359
Blood Product Expiration Date: 202001242359
ISSUE DATE / TIME: 201912282004
ISSUE DATE / TIME: 201912281642
Unit Type and Rh: 5100
Unit Type and Rh: 5100

## 2018-04-17 LAB — GLUCOSE, CAPILLARY
Glucose-Capillary: 83 mg/dL (ref 70–99)
Glucose-Capillary: 128 mg/dL — ABNORMAL HIGH (ref 70–99)

## 2018-04-17 LAB — HIV ANTIBODY (ROUTINE TESTING W REFLEX): HIV Screen 4th Generation wRfx: NONREACTIVE

## 2018-04-17 LAB — MAGNESIUM: Magnesium: 2.3 mg/dL (ref 1.7–2.4)

## 2018-04-17 MED ORDER — MOMETASONE FURO-FORMOTEROL FUM 100-5 MCG/ACT IN AERO: 2.0000 | INHALATION_SPRAY | Freq: Every day | RESPIRATORY_TRACT | 1 refills | Status: DC

## 2018-04-17 MED ORDER — FERROUS SULFATE 325 (65 FE) MG PO TABS: 325.0000 mg | ORAL_TABLET | Freq: Two times a day (BID) | ORAL | Status: AC

## 2018-04-17 MED ORDER — PANTOPRAZOLE SODIUM 40 MG PO TBEC: 40.0000 mg | DELAYED_RELEASE_TABLET | Freq: Every day | ORAL | 1 refills | Status: DC

## 2018-04-17 MED ORDER — FERROUS SULFATE 325 (65 FE) MG PO TABS: 325.0000 mg | ORAL_TABLET | Freq: Two times a day (BID) | ORAL | 3 refills | Status: DC

## 2018-04-17 MED ORDER — FERROUS SULFATE 325 (65 FE) MG PO TABS: 325.0000 mg | ORAL_TABLET | Freq: Two times a day (BID) | ORAL | Status: DC

## 2018-04-17 MED ORDER — IPRATROPIUM-ALBUTEROL 20-100 MCG/ACT IN AERS: 1.0000 | INHALATION_SPRAY | Freq: Four times a day (QID) | RESPIRATORY_TRACT | 1 refills | Status: DC | PRN

## 2018-04-17 MED ORDER — SUCRALFATE 1 G PO TABS: 1.0000 g | ORAL_TABLET | Freq: Four times a day (QID) | ORAL | 1 refills | Status: DC

## 2018-04-17 MED ORDER — TIOTROPIUM BROMIDE MONOHYDRATE 18 MCG IN CAPS: 18.0000 ug | ORAL_CAPSULE | Freq: Every day | RESPIRATORY_TRACT | 2 refills | Status: DC

## 2018-04-17 MED ORDER — IPRATROPIUM-ALBUTEROL 0.5-2.5 (3) MG/3ML IN SOLN
3.0000 mL | Freq: Three times a day (TID) | RESPIRATORY_TRACT | Status: DC
  Administered 2018-04-17: 3 mL via RESPIRATORY_TRACT
  Filled 2018-04-17 (×2): qty 3

## 2018-04-17 NOTE — Progress Notes (Signed)
PT Cancellation/Screen Note  Patient Details Name: LACHLAN MCKIM MRN: [id number] DOB: [birth year]   Cancelled Treatment:    Reason Eval/Treat Not Completed: PT screened, no needs identified, will sign off. Spoke with pt who denied any needs for therapy. He stated he has been getting up unassisted and without difficulty. Will sign off at pt's request.    Weston Anna, PT Acute Rehabilitation Services Pager: [phone] Office: [phone]

## 2018-04-17 NOTE — Discharge Summary (Signed)
Physician Discharge Summary  Patient ID: Gregory Nunez MRN: [id number] DOB/AGE: May 19, 1945 72 y.o.  Admit date: 04/15/2018 Discharge date: 04/17/2018  Admission Diagnoses:  Discharge Diagnoses:  Active Problems:   Symptomatic anemia  Discharged Condition: stable  Hospital Course: Patient is a 72 year old African-American male with past medical history significant for diabetes mellitus, hypertension, tobacco use with likely undiagnosed COPD (patient has about 40 pack years), prostate cancerStatus post prostatectomyback 2 years ago with subsequent erectile dysfunction and multiple colonic polypsas per colonoscopy done last year by the local GI group amongst other comorbidities. Patient also has history of Goody powder use, and reported intermittent hematochezia. Patient presented with 1-week history of severe dyspnea on minimal exertion, shortness of breath, generalized fatigue and weakness of the legs.    Hemoglobin on presentation was 5.3 g/dL.  Patient was admitted for further assessment and management of symptomatic anemia.  Patient was transfused with 2 units of packed red blood cells.  Patient proceeded with EGD that revealed gastritis.  Patient was given IV iron (Feraheme) prior to discharge, and will be discharged on oral iron.  Patient's symptoms have improved significantly.    Hemoglobin prior to discharge was 7.5 g/dL.  Patient be discharged to the care of the primary care provider and the GI team.  Kindly continue to monitor H/H.  Patient has been advised to avoid Goody powder use.    Symptomatic anemia: Upper GI bleed. Patient has history of Goody powder use. Avoid NSAIDs, including Goody powder PPI. Colonoscopy done last year revealed multiple colonic polyps. Patient was transfused 2 units of packed red blood cells.   Hemoglobin prior to discharge was 7.5 g/dL.   Iron on admission was 12, TIBC of 560 and ferritin of 7, B12 level of 292 and folate of 16.6. 04/16/2018:  Patient has been transfused with 2 units of packed red blood cells.  Hemoglobin today 7.6 g/dL.   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 Further assessment by the primary care provider on discharge.  Hypertension: Continue to optimize.  Diabetes mellitus: Accu-Cheks AC at bedtime Cover with sliding scale insulin HbA1c 5.6%  History of prostate cancer: Patient follows up with the urology team.  Consults: None  Significant Diagnostic Studies:   Discharge Exam: Blood pressure (!) 144/84, pulse 96, temperature 98.6 F (37 C), temperature source Oral, resp. rate 18, height 5\' 11"  (1.803 m), weight 81.2 kg, SpO2 94 %.  Disposition: Discharge disposition: 01-Home or Self Care   Discharge Instructions    Diet - low sodium heart healthy   Complete by:  As directed    Increase activity slowly   Complete by:  As directed      Allergies as of 04/17/2018      Reactions   Penicillins Anxiety, Other (See Comments)   DID THE REACTION INVOLVE: Swelling of the face/tongue/throat, SOB, or low BP? No Sudden or severe rash/hives, skin peeling, or the inside of the mouth or nose? No Did it require medical treatment? No When did it last happen?many years ago If all above answers are "NO", may proceed with cephalosporin use.      Medication List    TAKE these medications   amLODipine 5 MG tablet Commonly known as:  NORVASC Take 5 mg by mouth every evening.   glimepiride 4 MG tablet Commonly known as:  AMARYL Take 4 mg by mouth every evening.   Ipratropium-Albuterol 20-100 MCG/ACT Aers respimat Commonly known as:  COMBIVENT RESPIMAT Inhale  1 puff into the lungs every 6 (six) hours as needed for wheezing or shortness of breath.   losartan 100 MG tablet Commonly known as:  COZAAR Take 100 mg by mouth every evening.   mometasone-formoterol 100-5 MCG/ACT Aero Commonly known as:  DULERA Inhale 2 puffs into the lungs  daily.   nicotine 21 mg/24hr patch Commonly known as:  NICODERM CQ - dosed in mg/24 hours Place 21 mg onto the skin daily.   pantoprazole 40 MG tablet Commonly known as:  PROTONIX Take 1 tablet (40 mg total) by mouth daily.   sucralfate 1 g tablet Commonly known as:  CARAFATE Take 1 tablet (1 g total) by mouth 4 (four) times daily.   tiotropium 18 MCG inhalation capsule Commonly known as:  SPIRIVA HANDIHALER Place 1 capsule (18 mcg total) into inhaler and inhale daily.   traZODone 50 MG tablet Commonly known as:  DESYREL Take 50 mg by mouth at bedtime as needed for sleep.      Follow-up Information    Armbruster, Carlota Raspberry, MD Follow up on 05/09/2018.   Specialty:  Gastroenterology Why:  1:45 pm Contact information: [address] [place] [address] [phone]           Signed: Bonnell Public 04/17/2018, 12:03 PM

## 2018-04-17 NOTE — Progress Notes (Signed)
Pt upset he's still on clear liquid diet.  [facility] on call MD called, states pt has to stay on clear liquid diet until attending sees him today.  Pt informed.

## 2018-04-18 LAB — FOLATE RBC
Folate, Hemolysate: 424.2 ng/mL
Folate, RBC: 1632 ng/mL (ref >498)
Hematocrit: 26 % — ABNORMAL LOW (ref 37.5–51.0)

## 2018-04-25 ENCOUNTER — Ambulatory Visit (HOSPITAL_COMMUNITY): Admission: RE | Admit: 2018-04-25 | Payer: Medicare HMO | Source: Ambulatory Visit | Admitting: General Surgery

## 2018-04-25 ENCOUNTER — Encounter (HOSPITAL_COMMUNITY): Admission: RE | Payer: Self-pay | Source: Ambulatory Visit

## 2018-04-25 SURGERY — LAPAROSCOPIC CHOLECYSTECTOMY WITH INTRAOPERATIVE CHOLANGIOGRAM
Anesthesia: General

## 2018-05-09 ENCOUNTER — Ambulatory Visit: Payer: Medicare HMO | Admitting: Gastroenterology

## 2018-05-23 ENCOUNTER — Ambulatory Visit (INDEPENDENT_AMBULATORY_CARE_PROVIDER_SITE_OTHER): Payer: Medicare HMO | Admitting: Sports Medicine

## 2018-05-23 ENCOUNTER — Encounter: Payer: Self-pay | Admitting: Sports Medicine

## 2018-05-23 DIAGNOSIS — L84 Corns and callosities: Secondary | ICD-10-CM

## 2018-05-23 DIAGNOSIS — B351 Tinea unguium: Secondary | ICD-10-CM

## 2018-05-23 DIAGNOSIS — M79674 Pain in right toe(s): Secondary | ICD-10-CM

## 2018-05-23 DIAGNOSIS — E1140 Type 2 diabetes mellitus with diabetic neuropathy, unspecified: Secondary | ICD-10-CM | POA: Diagnosis not present

## 2018-05-23 DIAGNOSIS — M79675 Pain in left toe(s): Secondary | ICD-10-CM

## 2018-05-23 NOTE — Progress Notes (Signed)
Subjective: Gregory Nunez is a 73 y.o. male patient with history of diabetes who presents to office today complaining of painful callus and nails while ambulating in shoes; unable to trim. His glucose reading this morning was not recorded,"Does not check"; Admits that he was in hospital for low Oxygen in blood and had to get a transfusion and was given Iron. Now feels better. No other issues.   Patient Active Problem List   Diagnosis Date Noted  . Symptomatic anemia 04/15/2018  . Prostate cancer ([place]) 02/27/2014  . Malignant neoplasm of prostate ([place]) 01/09/2014  . Bunion 01/17/2013  . 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 amLODipine (NORVASC) 5 MG tablet Take 5 mg by mouth every evening.    . ferrous sulfate 325 (65 FE) MG tablet Take 1 tablet (325 mg total) by mouth 2 (two) times daily with a meal. 30 tablet 3  . glimepiride (AMARYL) 4 MG tablet Take 4 mg by mouth every evening.     . Ipratropium-Albuterol (COMBIVENT RESPIMAT) 20-100 MCG/ACT AERS respimat Inhale 1 puff into the lungs every 6 (six) hours as needed for wheezing or shortness of breath. 4 g 1  . losartan (COZAAR) 100 MG tablet Take 100 mg by mouth every evening.     . mometasone-formoterol (DULERA) 100-5 MCG/ACT AERO Inhale 2 puffs into the lungs daily. 13 g 1  . nicotine (NICODERM CQ - DOSED IN MG/24 HOURS) 21 mg/24hr patch Place 21 mg onto the skin daily.    . pantoprazole (PROTONIX) 40 MG tablet Take 1 tablet (40 mg total) by mouth daily. 30 tablet 1  . sucralfate (CARAFATE) 1 g tablet Take 1 tablet (1 g total) by mouth 4 (four) times daily. 120 tablet 1  . tiotropium (SPIRIVA HANDIHALER) 18 MCG inhalation capsule Place 1 capsule (18 mcg total) into inhaler and inhale daily. 30 capsule 2  . traZODone (DESYREL) 50 MG tablet Take 50 mg by mouth at bedtime as needed for sleep.     No current facility-administered medications on file  prior to visit.    Allergies  Allergen Reactions  . Penicillins Anxiety and Other (See Comments)    DID THE REACTION INVOLVE: Swelling of the face/tongue/throat, SOB, or low BP? No Sudden or severe rash/hives, skin peeling, or the inside of the mouth or nose? No Did it require medical treatment? No When did it last happen?many years ago If all above answers are "NO", may proceed with cephalosporin use.       Recent Results (from the past 2160 hour(s))  Glucose, capillary     Status: Abnormal   Collection Time: 04/10/18  9:37 AM  Result Value Ref Range   Glucose-Capillary 107 (H) 70 - 99 mg/dL  Comprehensive metabolic panel     Status: Abnormal   Collection Time: 04/10/18 10:14 AM  Result Value Ref Range   Sodium 140 135 - 145 mmol/L   Potassium 3.4 (L) 3.5 - 5.1 mmol/L   Chloride 107 98 - 111 mmol/L   CO2 25 22 - 32 mmol/L   Glucose, Bld 89 70 - 99 mg/dL   BUN 16 8 - 23 mg/dL   Creatinine, Ser 0.93 0.61 - 1.24 mg/dL   Calcium 8.8 (L) 8.9 - 10.3 mg/dL   Total Protein 6.8 6.5 - 8.1 g/dL   Albumin 2.8 (L) 3.5 - 5.0 g/dL   AST 38 15 - 41 U/L   ALT  30 0 - 44 U/L   Alkaline Phosphatase 52 38 - 126 U/L   Total Bilirubin 0.5 0.3 - 1.2 mg/dL   GFR calc non Af Amer >60 >60 mL/min   GFR calc Af Amer >60 >60 mL/min   Anion gap 8 5 - 15    Comment: Performed at [facility], [place] [address] [place], [place] [address]  Hemoglobin A1c     Status: Abnormal   Collection Time: 04/10/18 10:14 AM  Result Value Ref Range   Hgb A1c MFr Bld 5.7 (H) 4.8 - 5.6 %    Comment: (NOTE) Pre diabetes:          5.7%-6.4% Diabetes:              >6.4% Glycemic control for   <7.0% adults with diabetes    Mean Plasma Glucose 116.89 mg/dL    Comment: Performed at [place] [address] [place] [address]  CBC     Status: Abnormal   Collection Time: 04/10/18 10:14 AM  Result Value Ref Range   WBC 4.4 4.0 - 10.5 K/uL   RBC 2.53 (L) 4.22 - 5.81 MIL/uL    Hemoglobin 6.6 (LL) 13.0 - 17.0 g/dL    Comment: REPEATED TO VERIFY THIS CRITICAL RESULT HAS VERIFIED AND BEEN CALLED TO B NANNEY,RN BY JACQUELYN HOLMES ON 12 23 2019 AT 1058, AND HAS BEEN READ BACK. CRITICAL RESULTS VERIFIED    HCT 23.0 (L) 39.0 - 52.0 %   MCV 90.9 80.0 - 100.0 fL   MCH 26.1 26.0 - 34.0 pg   MCHC 28.7 (L) 30.0 - 36.0 g/dL   RDW 14.9 11.5 - 15.5 %   Platelets 184 150 - 400 K/uL   nRBC 0.0 0.0 - 0.2 %    Comment: Performed at [facility], [place] [address] [place],  [address]  Basic metabolic panel     Status: Abnormal   Collection Time: 04/15/18  1:51 PM  Result Value Ref Range   Sodium 139 135 - 145 mmol/L   Potassium 4.0 3.5 - 5.1 mmol/L   Chloride 110 98 - 111 mmol/L   CO2 24 22 - 32 mmol/L   Glucose, Bld 164 (H) 70 - 99 mg/dL   BUN 21 8 - 23 mg/dL   Creatinine, Ser 1.02 0.61 - 1.24 mg/dL   Calcium 8.2 (L) 8.9 - 10.3 mg/dL   GFR calc non Af Amer >60 >60 mL/min   GFR calc Af Amer >60 >60 mL/min   Anion gap 5 5 - 15    Comment: Performed at [facility], [place] [address] [place],  [address]  CBC     Status: Abnormal   Collection Time: 04/15/18  1:51 PM  Result Value Ref Range   WBC 4.3 4.0 - 10.5 K/uL   RBC 2.07 (L) 4.22 - 5.81 MIL/uL   Hemoglobin 5.3 (LL) 13.0 - 17.0 g/dL    Comment: REPEATED TO VERIFY THIS CRITICAL RESULT HAS VERIFIED AND BEEN CALLED TO [place]ONARD,S BY BILLY HOOKER ON 12 28 2019 AT 1416, AND HAS BEEN READ BACK. CRITICAL VERIFIED    HCT 18.8 (L) 39.0 - 52.0 %   MCV 90.8 80.0 - 100.0 fL   MCH 25.6 (L) 26.0 - 34.0 pg   MCHC 28.2 (L) 30.0 - 36.0 g/dL   RDW 16.1 (H) 11.5 - 15.5 %   Platelets 158 150 - 400 K/uL   nRBC 0.0 0.0 - 0.2 %  Comment: Performed at [facility], [place] [address], [place], [place] [address]  I-stat troponin, ED     Status: None   Collection Time: 04/15/18  1:56 PM  Result Value Ref Range   Troponin i, poc 0.01 0.00 - 0.08 ng/mL   Comment 3             Comment: Due to the release kinetics of cTnI, a negative result within the first hours of the onset of symptoms does not rule out myocardial infarction with certainty. If myocardial infarction is still suspected, repeat the test at appropriate intervals.   Type and screen [place]     Status: None   Collection Time: 04/15/18  2:32 PM  Result Value Ref Range   ABO/RH(D) O POS    Antibody Screen NEG    Sample Expiration 04/18/2018    Unit Number [id number]    Blood Component Type RED CELLS,LR    Unit division 00    Status of Unit ISSUED,FINAL    Transfusion Status OK TO TRANSFUSE    Crossmatch Result Compatible    Unit Number [id number]    Blood Component Type RED CELLS,LR    Unit division 00    Status of Unit ISSUED,FINAL    Transfusion Status OK TO TRANSFUSE    Crossmatch Result      Compatible Performed at [facility], [place] [address], [place], [place] [address]   Vitamin B12     Status: None   Collection Time: 04/15/18  2:32 PM  Result Value Ref Range   Vitamin B-12 292 180 - 914 pg/mL    Comment: (NOTE) This assay is not validated for testing neonatal or myeloproliferative syndrome specimens for Vitamin B12 levels. Performed at [facility], [place] [address] [place], [place] [address]   Folate     Status: None   Collection Time: 04/15/18  2:32 PM  Result Value Ref Range   Folate 16.6 >5.9 ng/mL    Comment: Performed at [facility], [place] [address] [place], [place] [address]  Iron and TIBC     Status: Abnormal   Collection Time: 04/15/18  2:32 PM  Result Value Ref Range   Iron 12 (L) 45 - 182 ug/dL   TIBC 560 (H) 250 - 450 ug/dL   Saturation Ratios 2 (L) 17.9 - 39.5 %   UIBC 548 ug/dL    Comment: Performed at [facility], [place] [address] [place], [place] [address]  Ferritin     Status: Abnormal   Collection Time: 04/15/18  2:32 PM  Result Value Ref Range    Ferritin 7 (L) 24 - 336 ng/mL    Comment: Performed at [facility], [place] [address], [place], [place] [address]  Reticulocytes     Status: Abnormal   Collection Time: 04/15/18  2:32 PM  Result Value Ref Range   Retic Ct Pct 3.7 (H) 0.4 - 3.1 %   RBC. 2.11 (L) 4.22 - 5.81 MIL/uL   Retic Count, Absolute 77.4 19.0 - 186.0 K/uL   Immature Retic Fract 35.0 (H) 2.3 - 15.9 %    Comment: Performed at [facility], [place] [address] [place],  [address]  Differential     Status: None   Collection Time: 04/15/18  2:32 PM  Result Value Ref Range   Neutrophils Relative % 50 %   Neutro Abs 2.3 1.7 - 7.7 K/uL   Lymphocytes Relative  38 %   Lymphs Abs 1.8 0.7 - 4.0 K/uL   Monocytes Relative 10 %   Monocytes Absolute 0.5 0.1 - 1.0 K/uL   Eosinophils Relative 2 %   Eosinophils Absolute 0.1 0.0 - 0.5 K/uL   Basophils Relative 0 %   Basophils Absolute 0.0 0.0 - 0.1 K/uL   Immature Granulocytes 0 %   Abs Immature Granulocytes 0.01 0.00 - 0.07 K/uL   Polychromasia PRESENT     Comment: Performed at [facility], [place] [address], [place], [place] [address]  ABO/Rh     Status: None   Collection Time: 04/15/18  2:32 PM  Result Value Ref Range   ABO/RH(D)      O POS Performed at [facility], [place] [address], [place], [place] [address]   BPAM RBC     Status: None   Collection Time: 04/15/18  2:32 PM  Result Value Ref Range   ISSUE DATE / TIME [id number]    Blood Product Unit Number [id number]    PRODUCT CODE F6384Y65    Unit Type and Rh 5100    Blood Product Expiration Date 993570177939    ISSUE DATE / TIME 030092330076    Blood Product Unit Number [id number]    PRODUCT CODE L8937D42    Unit Type and Rh 5100    Blood Product Expiration Date 876811572620   Occult blood, poc device     Status: None   Collection Time: 04/15/18  2:43 PM  Result Value Ref Range   Fecal Occult Bld NEGATIVE NEGATIVE  Prepare RBC      Status: None   Collection Time: 04/15/18  2:45 PM  Result Value Ref Range   Order Confirmation      ORDER PROCESSED BY BLOOD BANK Performed at [facility], [place] [address] [place], [place] [address]   I-STAT, [place] Clap 8     Status: Abnormal   Collection Time: 04/15/18  3:04 PM  Result Value Ref Range   Sodium 139 135 - 145 mmol/L   Potassium 4.3 3.5 - 5.1 mmol/L   Chloride 109 98 - 111 mmol/L   BUN 21 8 - 23 mg/dL   Creatinine, Ser 1.00 0.61 - 1.24 mg/dL   Glucose, Bld 127 (H) 70 - 99 mg/dL   Calcium, Ion 1.11 (L) 1.15 - 1.40 mmol/L   TCO2 25 22 - 32 mmol/L   Hemoglobin 6.5 (LL) 13.0 - 17.0 g/dL   HCT 19.0 (L) 39.0 - 52.0 %   Comment NOTIFIED PHYSICIAN   Urinalysis, Complete w Microscopic     Status: Abnormal   Collection Time: 04/15/18 10:21 PM  Result Value Ref Range   Color, Urine COLORLESS (A) YELLOW   APPearance CLEAR CLEAR   Specific Gravity, Urine 1.004 (L) 1.005 - 1.030   pH 8.0 5.0 - 8.0   Glucose, UA NEGATIVE NEGATIVE mg/dL   Hgb urine dipstick NEGATIVE NEGATIVE   Bilirubin Urine NEGATIVE NEGATIVE   Ketones, ur NEGATIVE NEGATIVE mg/dL   Protein, ur 30 (A) NEGATIVE mg/dL   Nitrite NEGATIVE NEGATIVE   Leukocytes, UA NEGATIVE NEGATIVE   RBC / HPF 0-5 0 - 5 RBC/hpf   WBC, UA 0-5 0 - 5 WBC/hpf   Bacteria, UA NONE SEEN NONE SEEN   Mucus PRESENT     Comment: Performed at [facility], [place] [address] [place], [place] [address]  Troponin I - Now Then Q3H     Status: None   Collection Time: 04/16/18  1:32 AM  Result Value Ref Range   Troponin I <0.03 <0.03 ng/mL    Comment: Performed at [facility], [place] [address], [place], [place] [address]  Brain natriuretic peptide     Status: Abnormal   Collection Time: 04/16/18  1:32 AM  Result Value Ref Range   B Natriuretic Peptide 123.8 (H) 0.0 - 100.0 pg/mL    Comment: Performed at [facility], [place] [address] [place], [place] [address]  HIV  antibody (Routine Testing)     Status: None   Collection Time: 04/16/18  1:32 AM  Result Value Ref Range   HIV Screen 4th Generation wRfx Non Reactive Non Reactive    Comment: (NOTE) Performed At: [facility] [place], [place] [id number] Rush Farmer MD [id number]   Magnesium     Status: None   Collection Time: 04/16/18  1:32 AM  Result Value Ref Range   Magnesium 1.8 1.7 - 2.4 mg/dL    Comment: Performed at [facility], [place] [address] [place], [place] [address]  Phosphorus     Status: None   Collection Time: 04/16/18  1:32 AM  Result Value Ref Range   Phosphorus 3.2 2.5 - 4.6 mg/dL    Comment: Performed at [facility], [place] [address] [place], [place] [address]  TSH     Status: None   Collection Time: 04/16/18  1:32 AM  Result Value Ref Range   TSH 2.490 0.350 - 4.500 uIU/mL    Comment: Performed by a 3rd Generation assay with a functional sensitivity of <=0.01 uIU/mL. Performed at [facility], [place] [address] [place], [place] [address]   Hemoglobin A1c     Status: None   Collection Time: 04/16/18  1:32 AM  Result Value Ref Range   Hgb A1c MFr Bld 5.6 4.8 - 5.6 %    Comment: (NOTE) Pre diabetes:          5.7%-6.4% Diabetes:              >6.4% Glycemic control for   <7.0% adults with diabetes    Mean Plasma Glucose 114.02 mg/dL    Comment: Performed at [place] [address] [place], [place] [address]  Ferritin     Status: Abnormal   Collection Time: 04/16/18  1:32 AM  Result Value Ref Range   Ferritin 7 (L) 24 - 336 ng/mL    Comment: Performed at [facility], [place] [address], [place], [place] [address]  Iron and TIBC     Status: Abnormal   Collection Time: 04/16/18  1:32 AM  Result Value Ref Range   Iron 44 (L) 45 - 182 ug/dL   TIBC 608 (H) 250 - 450 ug/dL   Saturation Ratios 7 (L) 17.9 - 39.5 %   UIBC 564 ug/dL    Comment: Performed at [facility], [place] [address], [place], [place] [address]  Vitamin B12     Status: None   Collection Time: 04/16/18  1:32 AM  Result Value Ref Range   Vitamin B-12 301 180 - 914 pg/mL    Comment: (NOTE) This assay is not validated for testing neonatal or myeloproliferative syndrome specimens for Vitamin B12 levels. Performed at [facility], [place] [address], [place], [place] [address]   Folate RBC     Status: Abnormal   Collection Time: 04/16/18  1:32 AM  Result Value Ref Range  Folate, Hemolysate 424.2 Not Estab. ng/mL   Hematocrit 26.0 (L) 37.5 - 51.0 %   Folate, RBC 1,632 >498 ng/mL    Comment: (NOTE) Performed At: [facility] [place], [place] [id number] Rush Farmer MD [id number]   Troponin I - Now Then Q3H     Status: None   Collection Time: 04/16/18  5:48 AM  Result Value Ref Range   Troponin I <0.03 <0.03 ng/mL    Comment: Performed at [facility], [place] [address] [place], [place] [address]  Basic metabolic panel     Status: Abnormal   Collection Time: 04/16/18  5:48 AM  Result Value Ref Range   Sodium 138 135 - 145 mmol/L   Potassium 3.5 3.5 - 5.1 mmol/L    Comment: DELTA CHECK NOTED   Chloride 107 98 - 111 mmol/L   CO2 22 22 - 32 mmol/L   Glucose, Bld 145 (H) 70 - 99 mg/dL   BUN 15 8 - 23 mg/dL   Creatinine, Ser 0.98 0.61 - 1.24 mg/dL   Calcium 8.2 (L) 8.9 - 10.3 mg/dL   GFR calc non Af Amer >60 >60 mL/min   GFR calc Af Amer >60 >60 mL/min   Anion gap 9 5 - 15    Comment: Performed at [facility], [place] [address] [place], [place] [address]  CBC     Status: Abnormal   Collection Time: 04/16/18  5:55 AM  Result Value Ref Range   WBC 7.6 4.0 - 10.5 K/uL   RBC 2.81 (L) 4.22 - 5.81 MIL/uL   Hemoglobin 7.6 (L) 13.0 - 17.0 g/dL    Comment: REPEATED TO VERIFY POST TRANSFUSION SPECIMEN DELTA CHECK NOTED    HCT 24.5 (L) 39.0 - 52.0 %   MCV 87.2 80.0 - 100.0 fL   MCH  27.0 26.0 - 34.0 pg   MCHC 31.0 30.0 - 36.0 g/dL   RDW 15.4 11.5 - 15.5 %   Platelets 172 150 - 400 K/uL   nRBC 0.3 (H) 0.0 - 0.2 %    Comment: Performed at [facility], [place] [address], [place], [place] [address]  Glucose, capillary     Status: Abnormal   Collection Time: 04/16/18  1:03 PM  Result Value Ref Range   Glucose-Capillary 130 (H) 70 - 99 mg/dL  Glucose, capillary     Status: Abnormal   Collection Time: 04/16/18  4:17 PM  Result Value Ref Range   Glucose-Capillary 133 (H) 70 - 99 mg/dL  Glucose, capillary     Status: Abnormal   Collection Time: 04/16/18  9:24 PM  Result Value Ref Range   Glucose-Capillary 157 (H) 70 - 99 mg/dL  CBC with Differential/Platelet     Status: Abnormal   Collection Time: 04/17/18  5:17 AM  Result Value Ref Range   WBC 5.0 4.0 - 10.5 K/uL   RBC 2.82 (L) 4.22 - 5.81 MIL/uL   Hemoglobin 7.5 (L) 13.0 - 17.0 g/dL   HCT 25.0 (L) 39.0 - 52.0 %   MCV 88.7 80.0 - 100.0 fL   MCH 26.6 26.0 - 34.0 pg   MCHC 30.0 30.0 - 36.0 g/dL   RDW 15.6 (H) 11.5 - 15.5 %   Platelets 172 150 - 400 K/uL   nRBC 0.0 0.0 - 0.2 %   Neutrophils Relative % 56 %   Neutro Abs 2.8 1.7 - 7.7 K/uL   Lymphocytes Relative 30 %   Lymphs Abs 1.5 0.7 -  4.0 K/uL   Monocytes Relative 12 %   Monocytes Absolute 0.6 0.1 - 1.0 K/uL   Eosinophils Relative 2 %   Eosinophils Absolute 0.1 0.0 - 0.5 K/uL   Basophils Relative 0 %   Basophils Absolute 0.0 0.0 - 0.1 K/uL   Immature Granulocytes 0 %   Abs Immature Granulocytes 0.01 0.00 - 0.07 K/uL    Comment: Performed at [facility], [place] [address], [place], [place] [address]  Renal function panel     Status: Abnormal   Collection Time: 04/17/18  5:17 AM  Result Value Ref Range   Sodium 139 135 - 145 mmol/L   Potassium 3.7 3.5 - 5.1 mmol/L   Chloride 111 98 - 111 mmol/L   CO2 22 22 - 32 mmol/L   Glucose, Bld 84 70 - 99 mg/dL   BUN 12 8 - 23 mg/dL   Creatinine, Ser 1.03 0.61 - 1.24 mg/dL    Calcium 8.3 (L) 8.9 - 10.3 mg/dL   Phosphorus 3.3 2.5 - 4.6 mg/dL   Albumin 2.7 (L) 3.5 - 5.0 g/dL   GFR calc non Af Amer >60 >60 mL/min   GFR calc Af Amer >60 >60 mL/min   Anion gap 6 5 - 15    Comment: Performed at [facility], [place] [address], [place], [place] [address]  Magnesium     Status: None   Collection Time: 04/17/18  5:17 AM  Result Value Ref Range   Magnesium 2.3 1.7 - 2.4 mg/dL    Comment: Performed at [facility], [place] [address], [place], [place] [address]  Glucose, capillary     Status: None   Collection Time: 04/17/18  7:35 AM  Result Value Ref Range   Glucose-Capillary 83 70 - 99 mg/dL  Glucose, capillary     Status: Abnormal   Collection Time: 04/17/18 10:56 AM  Result Value Ref Range   Glucose-Capillary 128 (H) 70 - 99 mg/dL    Objective: General: Patient is awake, alert, and oriented x 3 and in no acute distress.  Integument: Skin is warm, dry and supple bilateral. Nails are mildly elongated and mildly, thickened and  dystrophic with subungual debris, consistent with onychomycosis, 1-5 bilateral. No signs of infection. Callus sub-met 5 bilateral and medial second toe on right and lateral first toe on the right with no signs of infection. Remaining integument unremarkable.  Vasculature:  Dorsalis Pedis pulse 2/4 bilateral. Posterior Tibial pulse  2/4 bilateral.  Capillary fill time <3 sec 1-5 bilateral. Positive hair growth to the level of the digits. Temperature gradient within normal limits. No varicosities present bilateral. No edema present bilateral.   Neurology: The patient has diminished sensation measured with a 5.07/10g Semmes Weinstein Monofilament at all pedal sites bilateral. Vibratory sensation diminished bilateral with tuning fork. No Babinski sign present bilateral.   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 Muscular strength 5/5 in all lower extremity muscular groups bilateral  without pain on range of motion . No tenderness with calf compression bilateral.  Assessment and Plan: Problem List Items Addressed This Visit    None    Visit Diagnoses    Pain due to onychomycosis of toenails of both feet    -  Primary   Corns and callosities       Type 2 diabetes, controlled, with neuropathy ([place])         -Examined patient. -Discussed and educated patient on diabetic foot care, especially with  regards to  the vascular, neurological and musculoskeletal systems. -Mechanically debrided nails x10 using a sterile nail nipper without incident and debrided callus 3 using sterile chisel blade without incident  -Continue with toe spacer for right foot as needed -Continue with diabetic shoes/insoles as previously recommended -Patient to return  in 3 months for at risk foot care -Patient advised to call the office if any problems or questions arise in the meantime.  Landis Martins, DPM

## 2018-07-28 ENCOUNTER — Encounter (INDEPENDENT_AMBULATORY_CARE_PROVIDER_SITE_OTHER): Payer: Self-pay | Admitting: Ophthalmology

## 2018-07-28 ENCOUNTER — Emergency Department (HOSPITAL_COMMUNITY)
Admission: EM | Admit: 2018-07-28 | Discharge: 2018-07-28 | Disposition: A | Discharge status: Home / Self Care | Payer: Medicare HMO | Attending: Emergency Medicine | Admitting: Emergency Medicine

## 2018-07-28 ENCOUNTER — Other Ambulatory Visit: Payer: Self-pay

## 2018-07-28 ENCOUNTER — Encounter (HOSPITAL_COMMUNITY): Payer: Self-pay

## 2018-07-28 ENCOUNTER — Ambulatory Visit (INDEPENDENT_AMBULATORY_CARE_PROVIDER_SITE_OTHER): Payer: Medicare HMO | Admitting: Ophthalmology

## 2018-07-28 DIAGNOSIS — F17210 Nicotine dependence, cigarettes, uncomplicated: Secondary | ICD-10-CM | POA: Insufficient documentation

## 2018-07-28 DIAGNOSIS — H33012 Retinal detachment with single break, left eye: Secondary | ICD-10-CM | POA: Insufficient documentation

## 2018-07-28 DIAGNOSIS — H3581 Retinal edema: Secondary | ICD-10-CM | POA: Diagnosis not present

## 2018-07-28 DIAGNOSIS — H4312 Vitreous hemorrhage, left eye: Secondary | ICD-10-CM

## 2018-07-28 DIAGNOSIS — E119 Type 2 diabetes mellitus without complications: Secondary | ICD-10-CM | POA: Insufficient documentation

## 2018-07-28 DIAGNOSIS — E113393 Type 2 diabetes mellitus with moderate nonproliferative diabetic retinopathy without macular edema, bilateral: Secondary | ICD-10-CM | POA: Diagnosis not present

## 2018-07-28 DIAGNOSIS — I10 Essential (primary) hypertension: Secondary | ICD-10-CM | POA: Insufficient documentation

## 2018-07-28 DIAGNOSIS — H25813 Combined forms of age-related cataract, bilateral: Secondary | ICD-10-CM

## 2018-07-28 DIAGNOSIS — H35033 Hypertensive retinopathy, bilateral: Secondary | ICD-10-CM

## 2018-07-28 DIAGNOSIS — H538 Other visual disturbances: Secondary | ICD-10-CM | POA: Diagnosis present

## 2018-07-28 DIAGNOSIS — Z79899 Other long term (current) drug therapy: Secondary | ICD-10-CM | POA: Insufficient documentation

## 2018-07-28 DIAGNOSIS — H3322 Serous retinal detachment, left eye: Secondary | ICD-10-CM

## 2018-07-28 LAB — COMPREHENSIVE METABOLIC PANEL
ALT: 39 U/L (ref 0–44)
AST: 30 U/L (ref 15–41)
Albumin: 3.1 g/dL — ABNORMAL LOW (ref 3.5–5.0)
Alkaline Phosphatase: 68 U/L (ref 38–126)
Anion gap: 7 (ref 5–15)
BUN: 24 mg/dL — ABNORMAL HIGH (ref 8–23)
CO2: 24 mmol/L (ref 22–32)
Calcium: 9.1 mg/dL (ref 8.9–10.3)
Chloride: 105 mmol/L (ref 98–111)
Creatinine, Ser: 0.91 mg/dL (ref 0.61–1.24)
GFR calc Af Amer: >60 mL/min (ref >60)
GFR calc non Af Amer: >60 mL/min (ref >60)
Glucose, Bld: 239 mg/dL — ABNORMAL HIGH (ref 70–99)
Potassium: 3.6 mmol/L (ref 3.5–5.1)
Sodium: 136 mmol/L (ref 135–145)
Total Bilirubin: 0.4 mg/dL (ref 0.3–1.2)
Total Protein: 7.8 g/dL (ref 6.5–8.1)

## 2018-07-28 LAB — CBC WITH DIFFERENTIAL/PLATELET
Abs Immature Granulocytes: 0.01 10*3/uL (ref 0.00–0.07)
Basophils Absolute: 0 10*3/uL (ref 0.0–0.1)
Basophils Relative: 0 %
Eosinophils Absolute: 0.1 10*3/uL (ref 0.0–0.5)
Eosinophils Relative: 2 %
HCT: 39.9 % (ref 39.0–52.0)
Hemoglobin: 13 g/dL (ref 13.0–17.0)
Immature Granulocytes: 0 %
Lymphocytes Relative: 41 %
Lymphs Abs: 1.9 10*3/uL (ref 0.7–4.0)
MCH: 29.5 pg (ref 26.0–34.0)
MCHC: 32.6 g/dL (ref 30.0–36.0)
MCV: 90.7 fL (ref 80.0–100.0)
Monocytes Absolute: 0.4 10*3/uL (ref 0.1–1.0)
Monocytes Relative: 9 %
Neutro Abs: 2.1 10*3/uL (ref 1.7–7.7)
Neutrophils Relative %: 48 %
Platelets: 162 10*3/uL (ref 150–400)
RBC: 4.4 MIL/uL (ref 4.22–5.81)
RDW: 12.3 % (ref 11.5–15.5)
WBC: 4.6 10*3/uL (ref 4.0–10.5)
nRBC: 0 % (ref 0.0–0.2)

## 2018-07-28 LAB — CBG MONITORING, ED: Glucose-Capillary: 226 mg/dL — ABNORMAL HIGH (ref 70–99)

## 2018-07-28 MED ORDER — BEVACIZUMAB CHEMO INJECTION 1.25MG/0.05ML SYRINGE FOR KALEIDOSCOPE
1.2500 mg | INTRAVITREAL | Status: AC | PRN
  Administered 2018-07-28: 1.25 mg via INTRAVITREAL

## 2018-07-28 NOTE — Progress Notes (Addendum)
[facility] Note  07/28/2018     CHIEF COMPLAINT Patient presents for Retina Evaluation and Blurred Vision   HISTORY OF PRESENT ILLNESS: Gregory Nunez is a 73 y.o. male who presents to the clinic today for:   HPI    Retina Evaluation    In left eye.  This started days ago.  Duration of days.  Associated Symptoms Floaters.  Context:  distance vision and near vision.  I, the attending physician,  performed the HPI with the patient and updated documentation appropriately.          Blurred Vision    In left eye.  Onset was sudden.  Severity is complete loss of vision.  Occurring constantly.  I, the attending physician,  performed the HPI with the patient and updated documentation appropriately.          Comments    Patient states he has had floaters in his left eye for several weeks and within the last 4 days, he noticed a loss of vision OS and noticed a shade/veil over his left eye.  Patient denies eye pain.  Patient denies any recent trauma.  Patient denies flashes of light.       Last edited by Bernarda Caffey, MD on 07/28/2018  1:04 PM. (History)    pt states he started seeing black dots in his vision about 2 weeks ago, he states about 4 days ago he noticed a black veil in his vision, he states he still sees it if he covers his right eye, he states he does not recall doing any strenuous activity when this started, pt states he is diabetic and only takes glimepiride, he states he has been on this same medication for several years, he states he does not check his blood sugar at home, but today in the ED it was 239, his A1c was last checked in December 2019 and it was 5.6, he states he also has high blood pressure, pt denies seeing flashing lights, pt denies taking aspirin or blood thinners  Referring physician:   HISTORICAL INFORMATION:   Selected notes from the MEDICAL RECORD NUMBER Referred by ED for RD OS LEE:  Ocular Hx- PMH-DM [takes glimeperide  (A1c: 5.6, 04/16/18) BS: 239 (04.10.20)]    CURRENT MEDICATIONS: No current outpatient medications on file. (Ophthalmic Drugs)   No current facility-administered medications for this visit.  (Ophthalmic Drugs)   Current Outpatient Medications (Other)  Medication Sig   ferrous sulfate 325 (65 FE) MG tablet Take 1 tablet (325 mg total) by mouth 2 (two) times daily with a meal. (Patient taking differently: Take 325 mg by mouth daily. )   gabapentin (NEURONTIN) 300 MG capsule Take 300 mg by mouth 3 (three) times daily.   glimepiride (AMARYL) 4 MG tablet Take 4 mg by mouth every evening.    Ipratropium-Albuterol (COMBIVENT RESPIMAT) 20-100 MCG/ACT AERS respimat Inhale 1 puff into the lungs every 6 (six) hours as needed for wheezing or shortness of breath.   losartan (COZAAR) 100 MG tablet Take 100 mg by mouth every evening.    mometasone-formoterol (DULERA) 100-5 MCG/ACT AERO Inhale 2 puffs into the lungs daily. (Patient not taking: Reported on 07/28/2018)   pantoprazole (PROTONIX) 40 MG tablet Take 1 tablet (40 mg total) by mouth daily. (Patient not taking: Reported on 07/28/2018)   sucralfate (CARAFATE) 1 g tablet Take 1 tablet (1 g total) by mouth 4 (four) times daily.   tiotropium (SPIRIVA HANDIHALER) 18 MCG inhalation  capsule Place 1 capsule (18 mcg total) into inhaler and inhale daily. (Patient not taking: Reported on 07/28/2018)   traZODone (DESYREL) 50 MG tablet Take 50 mg by mouth at bedtime as needed for sleep.   No current facility-administered medications for this visit.  (Other)      REVIEW OF SYSTEMS: ROS    Positive for: Endocrine, Eyes   Negative for: Constitutional, Gastrointestinal, Neurological, Skin, Genitourinary, Musculoskeletal, HENT, Cardiovascular, Respiratory, Psychiatric, Allergic/Imm, Heme/Lymph   Last edited by Doneen Poisson on 07/28/2018 12:48 PM. (History)       ALLERGIES Allergies  Allergen Reactions   Penicillins Anxiety and Other (See  Comments)    DID THE REACTION INVOLVE: Swelling of the face/tongue/throat, SOB, or low BP? No Sudden or severe rash/hives, skin peeling, or the inside of the mouth or nose? No Did it require medical treatment? No When did it last happen?many years ago If all above answers are NO, may proceed with cephalosporin use.       PAST MEDICAL HISTORY Past Medical History:  Diagnosis Date   Depression    Diabetes mellitus without complication ([place])    GERD (gastroesophageal reflux disease)    if drinks alcohol   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   Patient is approximately 5-week status post bunion correction left foot   Hyperlipidemia    Hypertension    Malignant neoplasm of prostate ([place]) 01/09/2014   Pancreatitis    Prostate cancer ([place]) 12/19/13   Gleason 4+3=7, volume 31.31 cc   Shortness of breath dyspnea    with exertion    Past Surgical History:  Procedure Laterality Date   BIOPSY  04/16/2018   Procedure: BIOPSY;  Surgeon: Juanita Craver, MD;  Location: [facility];  Service: Endoscopy;;   biopsy on throat     hx of    ESOPHAGOGASTRODUODENOSCOPY Left 04/16/2018   Procedure: ESOPHAGOGASTRODUODENOSCOPY (EGD);  Surgeon: Juanita Craver, MD;  Location: [facility];  Service: Endoscopy;  Laterality: Left;   FOOT SURGERY     HOT HEMOSTASIS N/A 04/16/2018   Procedure: HOT HEMOSTASIS (ARGON PLASMA COAGULATION/BICAP);  Surgeon: Juanita Craver, MD;  Location: [facility];  Service: Endoscopy;  Laterality: N/A;   LYMPHADENECTOMY Bilateral 02/27/2014   Procedure: BILATERAL LYMPHADENECTOMY;  Surgeon: Alexis Frock, MD;  Location: [facility];  Service: Urology;  Laterality: Bilateral;   PROSTATE BIOPSY  12/2013   Gleason 4+3=7, volume 31.31 cc   ROBOT ASSISTED LAPAROSCOPIC RADICAL PROSTATECTOMY N/A 02/27/2014   Procedure: ROBOTIC ASSISTED LAPAROSCOPIC RADICAL PROSTATECTOMY WITH INDOCYANINE GREEN DYE;  Surgeon: Alexis Frock, MD;  Location: [facility];  Service: Urology;   Laterality: N/A;    FAMILY HISTORY Family History  Problem Relation Age of Onset   Heart disease Mother    Cancer Sister        breast   Colon cancer Neg Hx    Esophageal cancer Neg Hx    Rectal cancer Neg Hx    Stomach cancer Neg Hx     SOCIAL HISTORY Social History   Tobacco Use   Smoking status: Current Some Day Smoker    Packs/day: 0.50    Years: 40.00    Pack years: 20.00    Types: Cigarettes   Smokeless tobacco: Never Used   Tobacco comment: smokes a couple every day or two  Substance Use Topics   Alcohol use: No    Comment: Patient states no alcohol in 2 years.    Drug use: Not Currently    Types: Marijuana, Cocaine    Comment:  past hx approx 30 years ago          OPHTHALMIC EXAM:  Base Eye Exam    Visual Acuity (Snellen - Linear)      Right Left   Dist [place] 20/30 -1 20/CF   Dist ph  20/20 -2 NI       Tonometry (Tonopen, 12:50 PM)      Right Left   Pressure 18 18       Pupils      Dark Light Shape React APD   Right 4 3 Round Brisk 0   Left 4 3 Round Brisk 0       Visual Fields      Left Right     Full   Restrictions Total inferior temporal, inferior nasal deficiencies        Extraocular Movement      Right Left    Full Full       Neuro/Psych    Oriented x3:  Yes   Mood/Affect:  Normal       Dilation    Both eyes:  1.0% Mydriacyl, 2.5% Phenylephrine @ 12:50 PM        Slit Lamp and Fundus Exam    Slit Lamp Exam      Right Left   Lids/Lashes Dermatochalasis - upper lid Dermatochalasis - upper lid   Conjunctiva/Sclera nasal Pinguecula nasal and temporal Pinguecula, Melanosis   Cornea Arcus, 2+ Punctate epithelial erosions Arcus, 3+ diffuse Punctate epithelial erosions   Anterior Chamber Deep and quiet Deep and quiet   Iris Round and dilated, No NVI Round and dilated, No NVI, vertical PPM from 1100-0700   Lens 2-3+ Nuclear sclerosis, 2-3+ Cortical cataract 2-3+ Nuclear sclerosis, 3+ Cortical cataract   Vitreous  Vitreous syneresis Vitreous syneresis, +RBC, diffue VH greatest central, blood clots settling inferiorly       Fundus Exam      Right Left   Disc Pink and Sharp hazy view, perfused   C/D Ratio 0.5    Macula Flat, Blunted foveal reflex, Retinal pigment epithelial mottling, rare MA, no edema hazy view, no details visible, grossly attached   Vessels Vascular attenuation, Tortuous, AV crossing changes Vascular attenuation   Periphery Attached, scattered DBH Attached superiory, temporally and nasally, scattered DBH, blood blots settling inferiorly, no view of inferior peripheray        Refraction    Wearing Rx      Sphere   Right None   Left None       Manifest Refraction      Sphere Cylinder Dist VA   Right +0.50 Sphere 20/20-2   Left NI +/-3.00            IMAGING AND PROCEDURES  Imaging and Procedures for @TODAY @  OCT, Retina - OU - Both Eyes       Right Eye Quality was good. Central Foveal Thickness: 276. Progression has no prior data. Findings include normal foveal contour, no SRF, no IRF (Irregular lamination, no DME).   Left Eye Quality was poor. Findings include no SRF, no IRF.   Notes *Images captured and stored on drive  Diagnosis / Impression:  OD: NFP, no IRF/SRF OS: vitreous hemorrhage   Clinical management:  See below  Abbreviations: NFP - Normal foveal profile. CME - cystoid macular edema. PED - pigment epithelial detachment. IRF - intraretinal fluid. SRF - subretinal fluid. EZ - ellipsoid zone. ERM - epiretinal membrane. ORA - outer retinal atrophy. ORT - outer retinal tubulation. SRHM -  subretinal hyper-reflective material        Intravitreal Injection, Pharmacologic Agent - OS - Left Eye       Time Out 07/28/2018. 1:48 PM. Confirmed correct patient, procedure, site, and patient consented.   Anesthesia Topical anesthesia was used. Anesthetic medications included Lidocaine 2%, Proparacaine 0.5%.   Procedure Preparation included 5% betadine  to ocular surface, eyelid speculum. A supplied needle was used.   Injection:  1.25 mg Bevacizumab (AVASTIN) SOLN   NDC: 69678-938-10, Lot: [id number] , Expiration date: 07/28/2018   Route: Intravitreal, Site: Left Eye, Waste: 0 mL  Post-op Post injection exam found visual acuity of at least counting fingers. The patient tolerated the procedure well. There were no complications. The patient received written and verbal post procedure care education.        CBG monitoring, ED     Component Value Flag Ref Range Units Status   Glucose-Capillary 226    70 - 99 mg/dL Final        CBC with Differential/Platelet     Component Value Flag Ref Range Units Status   WBC 4.6    4.0 - 10.5 K/uL Final   RBC 4.40    4.22 - 5.81 MIL/uL Final   Hemoglobin 13.0    13.0 - 17.0 g/dL Final   HCT 39.9    39.0 - 52.0 % Final   MCV 90.7    80.0 - 100.0 fL Final   MCH 29.5    26.0 - 34.0 pg Final   MCHC 32.6    30.0 - 36.0 g/dL Final   RDW 12.3    11.5 - 15.5 % Final   Platelets 162    150 - 400 K/uL Final   nRBC 0.0    0.0 - 0.2 % Final   Neutrophils Relative % 48     % Final   Neutro Abs 2.1    1.7 - 7.7 K/uL Final   Lymphocytes Relative 41     % Final   Lymphs Abs 1.9    0.7 - 4.0 K/uL Final   Monocytes Relative 9     % Final   Monocytes Absolute 0.4    0.1 - 1.0 K/uL Final   Eosinophils Relative 2     % Final   Eosinophils Absolute 0.1    0.0 - 0.5 K/uL Final   Basophils Relative 0     % Final   Basophils Absolute 0.0    0.0 - 0.1 K/uL Final   Immature Granulocytes 0     % Final   Abs Immature Granulocytes 0.01    0.00 - 0.07 K/uL Final   Comment:   Performed at [facility], [place] [address] [place], [place]        Comprehensive metabolic panel     Component Value Flag Ref Range Units Status   Sodium 136    135 - 145 mmol/L Final   Potassium 3.6    3.5 - 5.1 mmol/L Final   Chloride 105    98 - 111 mmol/L Final   CO2 24    22 - 32 mmol/L Final   Glucose, Bld  239    70 - 99 mg/dL Final   BUN 24    8 - 23 mg/dL Final   Creatinine, Ser 0.91    0.61 - 1.24 mg/dL Final   Calcium 9.1    8.9 - 10.3 mg/dL Final   Total Protein 7.8    6.5 - 8.1  g/dL Final   Albumin 3.1    3.5 - 5.0 g/dL Final   AST 30    15 - 41 U/L Final   ALT 39    0 - 44 U/L Final   Alkaline Phosphatase 68    38 - 126 U/L Final   Total Bilirubin 0.4    0.3 - 1.2 mg/dL Final   GFR calc non Af Amer >60    >60 mL/min Final   GFR calc Af Amer >60    >60 mL/min Final   Anion gap 7    5 - 15  Final   Comment:   Performed at [facility], [place] [address], [place], [place] [address]                 ASSESSMENT/PLAN:    ICD-10-CM   1. Vitreous hemorrhage of left eye (HCC) H43.12 Intravitreal Injection, Pharmacologic Agent - OS - Left Eye    Bevacizumab (AVASTIN) SOLN 1.25 mg  2. Moderate nonproliferative diabetic retinopathy of both eyes without macular edema associated with type 2 diabetes mellitus (HCC) G83.6629 Intravitreal Injection, Pharmacologic Agent - OS - Left Eye    Bevacizumab (AVASTIN) SOLN 1.25 mg  3. Retinal edema H35.81 OCT, Retina - OU - Both Eyes  4. Essential hypertension I10   5. Hypertensive retinopathy of both eyes H35.033   6. Combined forms of age-related cataract of both eyes H25.813     1. Vitreous Hemorrhage OS  - onset of floaters several weeks prior to presentation and significant vision loss noted around 04.06.20 by pt history  - unclear etiology, but suspect related to history of DM and HTN  - diffuse central VH with some settling inferiorly  - peripheral exam shows no RT/RD OS  - discussed findings and treatment options  - recommend IVA OS #1 today, 04.10.20 to help expedite clearance  - pt wishes to proceed  - RBA of procedure discussed, questions answered  - informed consent obtained and signed  - see procedure note  - VH precautions reviewed -- minimize activities, keep head elevated, avoid ASA/NSAIDs/blood thinners as  able  - f/u 10-12 days   2,3. Moderate nonproliferative diabetic retinopathy w/o DME, OU  - The incidence, risk factors for progression, natural history and treatment options for diabetic retinopathy were discussed with patient.    - The need for close monitoring of blood glucose, blood pressure, and serum lipids, avoiding cigarette or any type of tobacco, and the need for long term follow up was also discussed with patient.  - exam shows scattered DBH OU  - OCT without diabetic macular edema, OD -- OS no view of macula  - will need FA once VH clears  4,5. Hypertensive retinopathy OU - discussed importance of tight BP control - discussed possible contribution to [facility] OS - monitor  6. Mixed form age related cataracts OU  - The symptoms of cataract, surgical options, and treatments and risks were discussed with patient.  - discussed diagnosis and progression  - not yet visually significant  - monitor for now   Ophthalmic Meds Ordered this visit:  Meds ordered this encounter  Medications   Bevacizumab (AVASTIN) SOLN 1.25 mg       Return for f/u 10-12 days, VH OS, DFE, OCT.  There are no Patient Instructions on file for this visit.   Explained the diagnoses, plan, and follow up with the patient and they expressed understanding.  Patient expressed understanding of the importance of proper  follow up care.   This document serves as a record of services personally performed by Gardiner Sleeper, MD, PhD. It was created on their behalf by Ernest Mallick, OA, an ophthalmic assistant. The creation of this record is the provider's dictation and/or activities during the visit.    Electronically signed by: Ernest Mallick, OA  04.10.2020 2:44 PM    Gardiner Sleeper, M.D., Ph.D. Diseases & Surgery of the Retina and Vitreous [facility]  I have reviewed the above documentation for accuracy and completeness, and I agree with the above. Gardiner Sleeper, M.D., Ph.D. 07/28/18  2:44 PM      Abbreviations: M myopia (nearsighted); A astigmatism; H hyperopia (farsighted); P presbyopia; Mrx spectacle prescription;  CTL contact lenses; OD right eye; OS left eye; OU both eyes  XT exotropia; ET esotropia; PEK punctate epithelial keratitis; PEE punctate epithelial erosions; DES dry eye syndrome; MGD meibomian gland dysfunction; ATs artificial tears; PFAT's preservative free artificial tears; [place] nuclear sclerotic cataract; PSC posterior subcapsular cataract; ERM epi-retinal membrane; PVD posterior vitreous detachment; RD retinal detachment; DM diabetes mellitus; DR diabetic retinopathy; NPDR non-proliferative diabetic retinopathy; PDR proliferative diabetic retinopathy; CSME clinically significant macular edema; DME diabetic macular edema; dbh dot blot hemorrhages; CWS cotton wool spot; POAG primary open angle glaucoma; C/D cup-to-disc ratio; HVF humphrey visual field; GVF goldmann visual field; OCT optical coherence tomography; IOP intraocular pressure; BRVO Branch retinal vein occlusion; CRVO central retinal vein occlusion; CRAO central retinal artery occlusion; BRAO branch retinal artery occlusion; RT retinal tear; SB scleral buckle; PPV pars plana vitrectomy; VH Vitreous hemorrhage; PRP panretinal laser photocoagulation; IVK intravitreal kenalog; VMT vitreomacular traction; MH Macular hole;  NVD neovascularization of the disc; NVE neovascularization elsewhere; AREDS age related eye disease study; ARMD age related macular degeneration; POAG primary open angle glaucoma; EBMD epithelial/anterior basement membrane dystrophy; ACIOL anterior chamber intraocular lens; IOL intraocular lens; PCIOL posterior chamber intraocular lens; Phaco/IOL phacoemulsification with intraocular lens placement; [place] photorefractive keratectomy; LASIK laser assisted in situ keratomileusis; HTN hypertension; DM diabetes mellitus; COPD chronic obstructive pulmonary disease

## 2018-07-28 NOTE — ED Triage Notes (Signed)
Patient c/o left eye sight is blurry x 4 days. Patient states he went to his PCP and was told to make an appointment with an eye specialist but states that he is unable to make an appointment til the end of the month due to covid 19. Patient states he is concerned because he can not see out of his left eye appropriately.

## 2018-07-28 NOTE — Discharge Instructions (Signed)
Go to Dr. Dairl Ponder office at 12:45 PM today for evaluation for retinal detachment   See your primary care doctor regarding your diabetes   Return to ER if you have worse blurry vision, headaches, weakness, numbness

## 2018-07-28 NOTE — ED Provider Notes (Signed)
[facility] DEPT Provider Note   CSN: [id number] Arrival date & time: 07/28/18  1761    History   Chief Complaint Chief Complaint  Patient presents with  . eye issue    HPI Gregory Nunez is a 73 y.o. male hx of DM, GERD, here presenting with blurry vision.  Patient states that for the last 3 days he has progressively worse blurry vision in the left eye.  He states that when he covers up the right eye he has difficulty seeing out of the left eye.  He states that the vision got worse to the point that he can only count his fingers on the left eye.  Patient states that he has been seeing spots and flashing lights.  Patient saw primary care doctor several days ago and was referred to ophthalmology but was unable to get in for an appointment until later this month.  He states that his vision got worse that he has trouble seeing on the left side so came in for for further evaluation.  Patient denies any numbness or weakness or trouble speaking.  Denies any history of stroke in the past.     The history is provided by the patient.    Past Medical History:  Diagnosis Date  . Depression   . Diabetes mellitus without complication ([place])   . GERD (gastroesophageal reflux disease)    if drinks alcohol  . 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 Hyperlipidemia   . Hypertension   . Malignant neoplasm of prostate ([place]) 01/09/2014  . Pancreatitis   . Prostate cancer ([place]) 12/19/13   Gleason 4+3=7, volume 31.31 cc  . Shortness of breath dyspnea    with exertion     Patient Active Problem List   Diagnosis Date Noted  . Symptomatic anemia 04/15/2018  . Prostate cancer ([place]) 02/27/2014  . Malignant neoplasm of prostate ([place]) 01/09/2014  . Bunion 01/17/2013  . 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 BIOPSY  04/16/2018   Procedure: BIOPSY;  Surgeon: Juanita Craver, MD;  Location: [facility];  Service: Endoscopy;;  . biopsy on throat     hx of   . ESOPHAGOGASTRODUODENOSCOPY Left 04/16/2018   Procedure: ESOPHAGOGASTRODUODENOSCOPY (EGD);  Surgeon: Juanita Craver, MD;  Location: [facility];  Service: Endoscopy;  Laterality: Left;  . FOOT SURGERY    . HOT HEMOSTASIS N/A 04/16/2018   Procedure: HOT HEMOSTASIS (ARGON PLASMA COAGULATION/BICAP);  Surgeon: Juanita Craver, MD;  Location: [facility];  Service: Endoscopy;  Laterality: N/A;  . LYMPHADENECTOMY Bilateral 02/27/2014   Procedure: BILATERAL LYMPHADENECTOMY;  Surgeon: Alexis Frock, MD;  Location: [facility];  Service: Urology;  Laterality: Bilateral;  . PROSTATE BIOPSY  12/2013   Gleason 4+3=7, volume 31.31 cc  . ROBOT ASSISTED LAPAROSCOPIC RADICAL PROSTATECTOMY N/A 02/27/2014   Procedure: ROBOTIC ASSISTED LAPAROSCOPIC RADICAL PROSTATECTOMY WITH INDOCYANINE GREEN DYE;  Surgeon: Alexis Frock, MD;  Location: [facility];  Service: Urology;  Laterality: N/A;        Home Medications    Prior to Admission medications   Medication Sig Start Date End Date Taking? Authorizing Provider  amLODipine (NORVASC) 5 MG tablet Take 5 mg by mouth every evening.    [provider]  citalopram (CELEXA) 10 MG tablet  05/19/18   [provider]  ferrous sulfate 325 (65 FE) MG tablet Take 1 tablet (325 mg total)  by mouth 2 (two) times daily with a meal. 04/17/18 04/17/19  Dana Allan I, MD  gabapentin (NEURONTIN) 300 MG capsule Take 300 mg by mouth 3 (three) times daily. 06/06/18   [provider]  glimepiride (AMARYL) 4 MG tablet Take 4 mg by mouth every evening.     [provider]  Ipratropium-Albuterol (COMBIVENT RESPIMAT) 20-100 MCG/ACT AERS respimat Inhale 1 puff into the lungs every 6 (six) hours as needed for wheezing or shortness of breath. 04/17/18   Bonnell Public, MD  losartan (COZAAR) 100 MG tablet Take 100 mg by mouth every  evening.     [provider]  mometasone-formoterol (DULERA) 100-5 MCG/ACT AERO Inhale 2 puffs into the lungs daily. 04/17/18   Dana Allan I, MD  nicotine (NICODERM CQ - DOSED IN MG/24 HOURS) 21 mg/24hr patch Place 21 mg onto the skin daily.    [provider]  pantoprazole (PROTONIX) 40 MG tablet Take 1 tablet (40 mg total) by mouth daily. 04/17/18 04/17/19  Dana Allan I, MD  sucralfate (CARAFATE) 1 g tablet Take 1 tablet (1 g total) by mouth 4 (four) times daily. 04/17/18 04/17/19  Bonnell Public, MD  tiotropium (SPIRIVA HANDIHALER) 18 MCG inhalation capsule Place 1 capsule (18 mcg total) into inhaler and inhale daily. 04/17/18 04/17/19  Dana Allan I, MD  traZODone (DESYREL) 50 MG tablet Take 50 mg by mouth at bedtime as needed for sleep.    [provider]    Family History Family History  Problem Relation Age of Onset  . Heart disease Mother   . Cancer Sister        breast  . Colon cancer Neg Hx   . Esophageal cancer Neg Hx   . Rectal cancer Neg Hx   . Stomach cancer Neg Hx     Social History Social History   Tobacco Use  . Smoking status: Current Some Day Smoker    Packs/day: 0.50    Years: 40.00    Pack years: 20.00    Types: Cigarettes  . Smokeless tobacco: Never Used  . Tobacco comment: smokes a couple every day or two  Substance Use Topics  . Alcohol use: No    Comment: Patient states no alcohol in 2 years.   . Drug use: Not Currently    Types: Marijuana, Cocaine    Comment: past hx approx 30 years ago      Allergies   Penicillins   Review of Systems Review of Systems  Eyes: Positive for visual disturbance.  All other systems reviewed and are negative.    Physical Exam Updated Vital Signs BP (!) 160/84 (BP Location: Left Arm)   Pulse 92   Temp 98.3 F (36.8 C) (Oral)   Resp 14   Ht 5\' 11"  (1.803 m)   Wt 81.6 kg   SpO2 100%   BMI 25.10 kg/m   Physical Exam Vitals signs and nursing note  reviewed.  HENT:     Head: Normocephalic.     Right Ear: Tympanic membrane normal.     Left Ear: Tympanic membrane normal.     Nose: Nose normal.     Mouth/Throat:     Mouth: Mucous membranes are moist.  Eyes:     Extraocular Movements: Extraocular movements intact.     Pupils: Pupils are equal, round, and reactive to light.     Comments: Finger counting on the left eye only. Extra ocular movements intact   Neck:     Musculoskeletal:  Normal range of motion.  Cardiovascular:     Rate and Rhythm: Normal rate and regular rhythm.     Pulses: Normal pulses.     Heart sounds: Normal heart sounds.  Pulmonary:     Effort: Pulmonary effort is normal.     Breath sounds: Normal breath sounds.  Abdominal:     General: Abdomen is flat.     Palpations: Abdomen is soft.  Musculoskeletal: Normal range of motion.        General: Swelling:    Skin:    General: Skin is warm.     Capillary Refill: Capillary refill takes less than 2 seconds.  Neurological:     General: No focal deficit present.     Mental Status: He is alert and oriented to person, place, and time.     Comments: CN 2- 12 intact. Nl strength and sensation throughout. Nl finger to nose bilaterally   Psychiatric:        Mood and Affect: Mood normal.      ED Treatments / Results  Labs (all labs ordered are listed, but only abnormal results are displayed) Labs Reviewed  COMPREHENSIVE METABOLIC PANEL - Abnormal; Notable for the following components:      Result Value   Glucose, Bld 239 (*)    BUN 24 (*)    Albumin 3.1 (*)    All other components within normal limits  CBG MONITORING, ED - Abnormal; Notable for the following components:   Glucose-Capillary 226 (*)    All other components within normal limits  CBC WITH DIFFERENTIAL/PLATELET    EKG None  Radiology No results found.  Procedures Procedures (including critical care time)  EMERGENCY DEPARTMENT [place] OCULAR EXAM "Study: Limited Ultrasound of Orbit "   INDICATIONS: Vision loss and Floaters/Flashes Linear probe utilized to obtain images in both long and short axis of the orbit having the patient look left and right if possible.  PERFORMED BY: Myself IMAGES ARCHIVED?: Yes LIMITATIONS: none VIEWS USED: Left orbit INTERPRETATION: Retinal detachment present    Medications Ordered in ED Medications - No data to display   Initial Impression / Assessment and Plan / ED Course  I have reviewed the triage vital signs and the nursing notes.  Pertinent labs & imaging results that were available during my care of the patient were reviewed by me and considered in my medical decision making (see chart for details).       Gregory Nunez is a 73 y.o. male here with blurry vision L eye. Patient only able to finger count on the left eye. Patient clinically has retinal detachment. I performed bedside US and confirmed the retinal detachment. No signs of stroke. I talked to Dr. Coralyn Pear from ophtho. He will see patient later today in his office.   10:50 AM Glucose 230, otherwise chemistry is unremarkable. Will send patient directly to ophtho office. No risk factors for COVID so he is safe to go to the office directly.   Final Clinical Impressions(s) / ED Diagnoses   Final diagnoses:  None    ED Discharge Orders    None       Drenda Freeze, MD 07/28/18 1051

## 2018-08-03 NOTE — Progress Notes (Signed)
[facility] Note  08/07/2018     CHIEF COMPLAINT Patient presents for Retina Follow Up   HISTORY OF PRESENT ILLNESS: Gregory Nunez is a 73 y.o. male who presents to the clinic today for:   HPI    Retina Follow Up    Patient presents with  Other (Vitreous hemorrhage).  In left eye.  Severity is moderate.  Duration of 10 days.  Since onset it is gradually improving.  I, the attending physician,  performed the HPI with the patient and updated documentation appropriately.          Comments    Patient states vision improving a little bit OS. Last A1c was 6.8 within the past 2 weeks.        Last edited by Bernarda Caffey, MD on 08/07/2018  9:59 AM. (History)    pt states he is doing better, he states he has been sleeping with his head elevated   Referring physician:   HISTORICAL INFORMATION:   Selected notes from the MEDICAL RECORD NUMBER Referred by ED for RD OS LEE:  Ocular Hx- PMH-DM [takes glimeperide (A1c: 5.6, 04/16/18) BS: 239 (04.10.20)]    CURRENT MEDICATIONS: No current outpatient medications on file. (Ophthalmic Drugs)   No current facility-administered medications for this visit.  (Ophthalmic Drugs)   Current Outpatient Medications (Other)  Medication Sig  . ferrous sulfate 325 (65 FE) MG tablet Take 1 tablet (325 mg total) by mouth 2 (two) times daily with a meal. (Patient taking differently: Take 325 mg by mouth daily. )  . gabapentin (NEURONTIN) 300 MG capsule Take 300 mg by mouth 3 (three) times daily.  Marland Kitchen glimepiride (AMARYL) 4 MG tablet Take 4 mg by mouth every evening.   . Ipratropium-Albuterol (COMBIVENT RESPIMAT) 20-100 MCG/ACT AERS respimat Inhale 1 puff into the lungs every 6 (six) hours as needed for wheezing or shortness of breath.  . losartan (COZAAR) 100 MG tablet Take 100 mg by mouth every evening.   . mometasone-formoterol (DULERA) 100-5 MCG/ACT AERO Inhale 2 puffs into the lungs daily.  . pantoprazole (PROTONIX)  40 MG tablet Take 1 tablet (40 mg total) by mouth daily.  . sucralfate (CARAFATE) 1 g tablet Take 1 tablet (1 g total) by mouth 4 (four) times daily.  Marland Kitchen tiotropium (SPIRIVA HANDIHALER) 18 MCG inhalation capsule Place 1 capsule (18 mcg total) into inhaler and inhale daily.  . traZODone (DESYREL) 50 MG tablet Take 50 mg by mouth at bedtime as needed for sleep.   No current facility-administered medications for this visit.  (Other)      REVIEW OF SYSTEMS: ROS    Positive for: Endocrine, Eyes   Negative for: Constitutional, Gastrointestinal, Neurological, Skin, Genitourinary, Musculoskeletal, HENT, Cardiovascular, Respiratory, Psychiatric, Allergic/Imm, Heme/Lymph   Last edited by Roselee Nova D on 08/07/2018  9:25 AM. (History)       ALLERGIES Allergies  Allergen Reactions  . Penicillins Anxiety and Other (See Comments)    DID THE REACTION INVOLVE: Swelling of the face/tongue/throat, SOB, or low BP? No Sudden or severe rash/hives, skin peeling, or the inside of the mouth or nose? No Did it require medical treatment? No When did it last happen?many years ago If all above answers are "NO", may proceed with cephalosporin use.       PAST MEDICAL HISTORY Past Medical History:  Diagnosis Date  . Depression   . Diabetes mellitus without complication ([place])   . GERD (gastroesophageal reflux disease)    if  drinks alcohol  . 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 Hyperlipidemia   . Hypertension   . Malignant neoplasm of prostate ([place]) 01/09/2014  . Pancreatitis   . Prostate cancer ([place]) 12/19/13   Gleason 4+3=7, volume 31.31 cc  . Shortness of breath dyspnea    with exertion    Past Surgical History:  Procedure Laterality Date  . BIOPSY  04/16/2018   Procedure: BIOPSY;  Surgeon: Juanita Craver, MD;  [facility];  Service: Endoscopy;;  . biopsy on throat     hx of   .  ESOPHAGOGASTRODUODENOSCOPY Left 04/16/2018   Procedure: ESOPHAGOGASTRODUODENOSCOPY (EGD);  Surgeon: Juanita Craver, MD;  Location: [facility];  Service: Endoscopy;  Laterality: Left;  . FOOT SURGERY    . HOT HEMOSTASIS N/A 04/16/2018   Procedure: HOT HEMOSTASIS (ARGON PLASMA COAGULATION/BICAP);  Surgeon: Juanita Craver, MD;  Location: [facility];  Service: Endoscopy;  Laterality: N/A;  . LYMPHADENECTOMY Bilateral 02/27/2014   Procedure: BILATERAL LYMPHADENECTOMY;  Surgeon: Alexis Frock, MD;  Location: [facility];  Service: Urology;  Laterality: Bilateral;  . PROSTATE BIOPSY  12/2013   Gleason 4+3=7, volume 31.31 cc  . ROBOT ASSISTED LAPAROSCOPIC RADICAL PROSTATECTOMY N/A 02/27/2014   Procedure: ROBOTIC ASSISTED LAPAROSCOPIC RADICAL PROSTATECTOMY WITH INDOCYANINE GREEN DYE;  Surgeon: Alexis Frock, MD;  Location: [facility];  Service: Urology;  Laterality: N/A;    FAMILY HISTORY Family History  Problem Relation Age of Onset  . Heart disease Mother   . Cancer Sister        breast  . Colon cancer Neg Hx   . Esophageal cancer Neg Hx   . Rectal cancer Neg Hx   . Stomach cancer Neg Hx     SOCIAL HISTORY Social History   Tobacco Use  . Smoking status: Current Some Day Smoker    Packs/day: 0.50    Years: 40.00    Pack years: 20.00    Types: Cigarettes  . Smokeless tobacco: Never Used  . Tobacco comment: smokes a couple every day or two  Substance Use Topics  . Alcohol use: No    Comment: Patient states no alcohol in 2 years.   . Drug use: Not Currently    Types: Marijuana, Cocaine    Comment: past hx approx 30 years ago          OPHTHALMIC EXAM:  Base Eye Exam    Visual Acuity (Snellen - Linear)      Right Left   Dist [place] 20/25 -1 20/100 -2   Dist ph [place] 20/25 NI       Tonometry (Tonopen, 9:36 AM)      Right Left   Pressure 20 14       Pupils      Dark Light Shape React APD   Right 4 3 Round Brisk None   Left 4 3 Round Brisk None       Visual Fields (Counting  fingers)      Left Right    Full Full  VF OS appears full today. Defect noted at last visit.       Extraocular Movement      Right Left    Full, Ortho Full, Ortho       Neuro/Psych    Oriented x3:  Yes   Mood/Affect:  Normal       Dilation    Both eyes:  1.0% Mydriacyl, 2.5% Phenylephrine @ 9:36 AM  Slit Lamp and Fundus Exam    Slit Lamp Exam      Right Left   Lids/Lashes Dermatochalasis - upper lid Dermatochalasis - upper lid   Conjunctiva/Sclera nasal Pinguecula nasal and temporal Pinguecula, Melanosis   Cornea Arcus, 1-2+ inferior Punctate epithelial erosions Arcus, 2-3+ inferior Punctate epithelial erosions   Anterior Chamber Deep and quiet Deep and quiet   Iris Round and dilated, No NVI Round and dilated, No NVI, vertical PPM from 1100-0700   Lens 2+ Nuclear sclerosis, 2-3+ Cortical cataract 2-3+ Nuclear sclerosis, 3+ Cortical cataract   Vitreous Vitreous syneresis, Posterior vitreous detachment Vitreous syneresis, +RBC, diffue VH improving and settling inferiorly; blood clots turning white and settling inferiorly       Fundus Exam      Right Left   Disc Pink and Sharp Pink and Sharp   C/D Ratio 0.5 0.4   Macula Flat, good foveal reflex, Retinal pigment epithelial mottling, rare MA, no edema Flat, blunted foveal reflex, Scattered IRH, mild Epiretinal membrane   Vessels Vascular attenuation, Tortuous, AV crossing changes Vascular attenuation   Periphery Attached, scattered DBH Attached superiorly, temporally and nasally, scattered DBH, blood blots settling inferiorly, no view of inferior periphery          IMAGING AND PROCEDURES  Imaging and Procedures for @TODAY @  OCT, Retina - OU - Both Eyes       Right Eye Quality was good. Central Foveal Thickness: 271. Progression has been stable. Findings include normal foveal contour, no SRF, no IRF (Irregular lamination, no DME).   Left Eye Quality was poor. Central Foveal Thickness: 354. Progression has  improved. Findings include no SRF, normal foveal contour, epiretinal membrane, outer retinal atrophy, intraretinal fluid, intraretinal hyper-reflective material, macular pucker (Abnormal foveal profile, +ERM/pucker, mild DME).   Notes *Images captured and stored on drive  Diagnosis / Impression:  OD: NFP, no IRF/SRF OS: Abnormal foveal profile, +ERM/pucker, mild DME   Clinical management:  See below  Abbreviations: NFP - Normal foveal profile. CME - cystoid macular edema. PED - pigment epithelial detachment. IRF - intraretinal fluid. SRF - subretinal fluid. EZ - ellipsoid zone. ERM - epiretinal membrane. ORA - outer retinal atrophy. ORT - outer retinal tubulation. SRHM - subretinal hyper-reflective material                 ASSESSMENT/PLAN:    ICD-10-CM   1. Vitreous hemorrhage of left eye (HCC) H43.12   2. Moderate nonproliferative diabetic retinopathy of both eyes without macular edema associated with type 2 diabetes mellitus ([place]) E26.8341   3. Retinal edema H35.81 OCT, Retina - OU - Both Eyes  4. Essential hypertension I10   5. Hypertensive retinopathy of both eyes H35.033   6. Combined forms of age-related cataract of both eyes H25.813     1. Vitreous Hemorrhage OS -- improving  - onset of floaters several weeks prior to presentation and significant vision loss noted around 04.06.20 by pt history  - unclear etiology, but suspect related to history of DM and HTN  - s/p IVA OS #1 (04.10.20)  - VH clearing and settling inferiorly  - peripheral exam shows no RT/RD OS  - OCT shows mild DME and ERM OS  - discussed findings and treatment options  - VH precautions reviewed -- minimize activities, keep head elevated, avoid ASA/NSAIDs/blood thinners as able  - f/u 10 days  2,3. Moderate nonproliferative diabetic retinopathy w/o DME, OU  - The incidence, risk factors for progression, natural history and treatment options  for diabetic retinopathy were discussed with patient.     - The need for close monitoring of blood glucose, blood pressure, and serum lipids, avoiding cigarette or any type of tobacco, and the need for long term follow up was also discussed with patient.  - exam shows scattered DBH OU  - OCT without diabetic macular edema, OD -- OS no view of macula  - will need FA once VH clears  4,5. Hypertensive retinopathy OU - discussed importance of tight BP control - discussed possible contribution to [facility] OS - monitor  6. Mixed form age related cataracts OU  - The symptoms of cataract, surgical options, and treatments and risks were discussed with patient.  - discussed diagnosis and progression  - not yet visually significant  - monitor for now   Ophthalmic Meds Ordered this visit:  No orders of the defined types were placed in this encounter.      Return in about 10 days (around 08/17/2018) for f/u VH OS, DFE, OCT.  There are no Patient Instructions on file for this visit.   Explained the diagnoses, plan, and follow up with the patient and they expressed understanding.  Patient expressed understanding of the importance of proper follow up care.   This document serves as a record of services personally performed by Gardiner Sleeper, MD, PhD. It was created on their behalf by Ernest Mallick, OA, an ophthalmic assistant. The creation of this record is the provider's dictation and/or activities during the visit.    Electronically signed by: Ernest Mallick, OA  04.16.2020 10:03 AM     Gardiner Sleeper, M.D., Ph.D. Diseases & Surgery of the Retina and Vitreous [facility]  I have reviewed the above documentation for accuracy and completeness, and I agree with the above. Gardiner Sleeper, M.D., Ph.D. 08/07/18 10:03 AM    Abbreviations: M myopia (nearsighted); A astigmatism; H hyperopia (farsighted); P presbyopia; Mrx spectacle prescription;  CTL contact lenses; OD right eye; OS left eye; OU both eyes  XT exotropia; ET  esotropia; PEK punctate epithelial keratitis; PEE punctate epithelial erosions; DES dry eye syndrome; MGD meibomian gland dysfunction; ATs artificial tears; PFAT's preservative free artificial tears; [place] nuclear sclerotic cataract; PSC posterior subcapsular cataract; ERM epi-retinal membrane; PVD posterior vitreous detachment; RD retinal detachment; DM diabetes mellitus; DR diabetic retinopathy; NPDR non-proliferative diabetic retinopathy; PDR proliferative diabetic retinopathy; CSME clinically significant macular edema; DME diabetic macular edema; dbh dot blot hemorrhages; CWS cotton wool spot; POAG primary open angle glaucoma; C/D cup-to-disc ratio; HVF humphrey visual field; GVF goldmann visual field; OCT optical coherence tomography; IOP intraocular pressure; BRVO Branch retinal vein occlusion; CRVO central retinal vein occlusion; CRAO central retinal artery occlusion; BRAO branch retinal artery occlusion; RT retinal tear; SB scleral buckle; PPV pars plana vitrectomy; VH Vitreous hemorrhage; PRP panretinal laser photocoagulation; IVK intravitreal kenalog; VMT vitreomacular traction; MH Macular hole;  NVD neovascularization of the disc; NVE neovascularization elsewhere; AREDS age related eye disease study; ARMD age related macular degeneration; POAG primary open angle glaucoma; EBMD epithelial/anterior basement membrane dystrophy; ACIOL anterior chamber intraocular lens; IOL intraocular lens; PCIOL posterior chamber intraocular lens; Phaco/IOL phacoemulsification with intraocular lens placement; [place] photorefractive keratectomy; LASIK laser assisted in situ keratomileusis; HTN hypertension; DM diabetes mellitus; COPD chronic obstructive pulmonary disease

## 2018-08-07 ENCOUNTER — Other Ambulatory Visit: Payer: Self-pay

## 2018-08-07 ENCOUNTER — Ambulatory Visit (INDEPENDENT_AMBULATORY_CARE_PROVIDER_SITE_OTHER): Payer: Medicare HMO | Admitting: Ophthalmology

## 2018-08-07 ENCOUNTER — Encounter (INDEPENDENT_AMBULATORY_CARE_PROVIDER_SITE_OTHER): Payer: Self-pay | Admitting: Ophthalmology

## 2018-08-07 ENCOUNTER — Ambulatory Visit (INDEPENDENT_AMBULATORY_CARE_PROVIDER_SITE_OTHER): Payer: Medicare HMO | Admitting: Podiatry

## 2018-08-07 VITALS — Temp 97.3°F

## 2018-08-07 DIAGNOSIS — E113393 Type 2 diabetes mellitus with moderate nonproliferative diabetic retinopathy without macular edema, bilateral: Secondary | ICD-10-CM | POA: Diagnosis not present

## 2018-08-07 DIAGNOSIS — H25813 Combined forms of age-related cataract, bilateral: Secondary | ICD-10-CM

## 2018-08-07 DIAGNOSIS — I10 Essential (primary) hypertension: Secondary | ICD-10-CM | POA: Diagnosis not present

## 2018-08-07 DIAGNOSIS — M7752 Other enthesopathy of left foot: Secondary | ICD-10-CM | POA: Diagnosis not present

## 2018-08-07 DIAGNOSIS — M7751 Other enthesopathy of right foot: Secondary | ICD-10-CM

## 2018-08-07 DIAGNOSIS — H3581 Retinal edema: Secondary | ICD-10-CM | POA: Diagnosis not present

## 2018-08-07 DIAGNOSIS — L989 Disorder of the skin and subcutaneous tissue, unspecified: Secondary | ICD-10-CM | POA: Diagnosis not present

## 2018-08-07 DIAGNOSIS — H35033 Hypertensive retinopathy, bilateral: Secondary | ICD-10-CM

## 2018-08-07 DIAGNOSIS — H4312 Vitreous hemorrhage, left eye: Secondary | ICD-10-CM | POA: Diagnosis not present

## 2018-08-07 NOTE — Progress Notes (Signed)
   HPI: 73 year old male presents the office today for bilateral foot pain.  Patient states the pain can be 10/10 on occasion.  Last night he was unable to sleep because of the pain.  Pain is at the levels of the fifth toes bilaterally.  Past Medical History:  Diagnosis Date  . Depression   . Diabetes mellitus without complication ([place])   . GERD (gastroesophageal reflux disease)    if drinks alcohol  . 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 Hyperlipidemia   . Hypertension   . Malignant neoplasm of prostate ([place]) 01/09/2014  . Pancreatitis   . Prostate cancer ([place]) 12/19/13   Gleason 4+3=7, volume 31.31 cc  . Shortness of breath dyspnea    with exertion      Physical Exam: General: The patient is alert and oriented x3 in no acute distress.  Dermatology: Skin is warm, dry and supple bilateral lower extremities. Negative for open lesions or macerations.  Hyperkeratotic callus lesions noted to the plantar aspect of the fifth MPJ bilateral  Vascular: Palpable pedal pulses bilaterally. No edema or erythema noted. Capillary refill within normal limits.  Neurological: Epicritic and protective threshold grossly intact bilaterally.   Musculoskeletal Exam: Range of motion within normal limits to all pedal and ankle joints bilateral. Muscle strength 5/5 in all groups bilateral.  Tenderness to palpation and range of motion of the fifth MTPJ bilateral   Assessment: 1.  Porokeratosis sub-fifth MPJ bilateral 2.  Fifth MPJ capsulitis bilateral   Plan of Care:  1. Patient evaluated.  2.  Injection of 0.5 cc Celestone Soluspan injected in the fifth MTPJ bilaterally 3.  Excisional debridement of the hyperkeratotic callus lesion was performed using chisel 11 blade without incident or bleeding bilaterally. 4.  Return to clinic in 2 months      Edrick Kins, DPM [facility]  Dr. Edrick Kins, DPM     [address], [place] [address]                Office [phone]  Fax [phone]

## 2018-08-16 NOTE — Progress Notes (Signed)
[facility] Note  08/17/2018     CHIEF COMPLAINT Patient presents for Retina Follow Up   HISTORY OF PRESENT ILLNESS: Gregory Nunez is a 73 y.o. male who presents to the clinic today for:   HPI    Retina Follow Up    Patient presents with  Other (Vit heme OS).  In left eye.  Severity is moderate.  Duration of 10 days.  Since onset it is gradually improving.  I, the attending physician,  performed the HPI with the patient and updated documentation appropriately.          Comments    Patient states vision improved OS. Using Refresh gtts BID OU for dry eyes. Hasn't checked BS recently. Doesn't have meter at home. Last A1c was less than 7, last checked early April 2020.        Last edited by Bernarda Caffey, MD on 08/17/2018  9:38 AM. (History)    pt c/o itching and using Refresh eye gtt for dry eye. May use OTC allergy gtts for itching. Advised patient against use of visine gtt.    Referring physician:   HISTORICAL INFORMATION:   Selected notes from the MEDICAL RECORD NUMBER Referred by ED for RD OS LEE:  Ocular Hx- PMH-DM [takes glimeperide (A1c: 5.6, 04/16/18) BS: 239 (04.10.20)]    CURRENT MEDICATIONS: No current outpatient medications on file. (Ophthalmic Drugs)   No current facility-administered medications for this visit.  (Ophthalmic Drugs)   Current Outpatient Medications (Other)  Medication Sig  . ferrous sulfate 325 (65 FE) MG tablet Take 1 tablet (325 mg total) by mouth 2 (two) times daily with a meal. (Patient taking differently: Take 325 mg by mouth daily. )  . gabapentin (NEURONTIN) 300 MG capsule Take 300 mg by mouth 3 (three) times daily.  Marland Kitchen glimepiride (AMARYL) 4 MG tablet Take 4 mg by mouth every evening.   . Ipratropium-Albuterol (COMBIVENT RESPIMAT) 20-100 MCG/ACT AERS respimat Inhale 1 puff into the lungs every 6 (six) hours as needed for wheezing or shortness of breath.  . losartan (COZAAR) 100 MG tablet Take 100 mg by  mouth every evening.   . mometasone-formoterol (DULERA) 100-5 MCG/ACT AERO Inhale 2 puffs into the lungs daily.  . pantoprazole (PROTONIX) 40 MG tablet Take 1 tablet (40 mg total) by mouth daily.  . sucralfate (CARAFATE) 1 g tablet Take 1 tablet (1 g total) by mouth 4 (four) times daily.  Marland Kitchen tiotropium (SPIRIVA HANDIHALER) 18 MCG inhalation capsule Place 1 capsule (18 mcg total) into inhaler and inhale daily.  . traZODone (DESYREL) 50 MG tablet Take 50 mg by mouth at bedtime as needed for sleep.   No current facility-administered medications for this visit.  (Other)      REVIEW OF SYSTEMS: ROS    Positive for: Endocrine, Eyes   Negative for: Constitutional, Gastrointestinal, Neurological, Skin, Genitourinary, Musculoskeletal, HENT, Cardiovascular, Respiratory, Psychiatric, Allergic/Imm, Heme/Lymph   Last edited by Roselee Nova D on 08/17/2018  8:46 AM. (History)       ALLERGIES Allergies  Allergen Reactions  . Penicillins Anxiety and Other (See Comments)    DID THE REACTION INVOLVE: Swelling of the face/tongue/throat, SOB, or low BP? No Sudden or severe rash/hives, skin peeling, or the inside of the mouth or nose? No Did it require medical treatment? No When did it last happen?many years ago If all above answers are "NO", may proceed with cephalosporin use.       PAST MEDICAL HISTORY Past  Medical History:  Diagnosis Date  . Depression   . Diabetes mellitus without complication ([place])   . GERD (gastroesophageal reflux disease)    if drinks alcohol  . 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 Hyperlipidemia   . Hypertension   . Malignant neoplasm of prostate ([place]) 01/09/2014  . Pancreatitis   . Prostate cancer ([place]) 12/19/13   Gleason 4+3=7, volume 31.31 cc  . Shortness of breath dyspnea    with exertion    Past Surgical History:  Procedure Laterality Date  . BIOPSY  04/16/2018   Procedure:  BIOPSY;  Surgeon: Juanita Craver, MD;  Location: [facility];  Service: Endoscopy;;  . biopsy on throat     hx of   . ESOPHAGOGASTRODUODENOSCOPY Left 04/16/2018   Procedure: ESOPHAGOGASTRODUODENOSCOPY (EGD);  Surgeon: Juanita Craver, MD;  Location: [facility];  Service: Endoscopy;  Laterality: Left;  . FOOT SURGERY    . HOT HEMOSTASIS N/A 04/16/2018   Procedure: HOT HEMOSTASIS (ARGON PLASMA COAGULATION/BICAP);  Surgeon: Juanita Craver, MD;  Location: [facility];  Service: Endoscopy;  Laterality: N/A;  . LYMPHADENECTOMY Bilateral 02/27/2014   Procedure: BILATERAL LYMPHADENECTOMY;  Surgeon: Alexis Frock, MD;  Location: [facility];  Service: Urology;  Laterality: Bilateral;  . PROSTATE BIOPSY  12/2013   Gleason 4+3=7, volume 31.31 cc  . ROBOT ASSISTED LAPAROSCOPIC RADICAL PROSTATECTOMY N/A 02/27/2014   Procedure: ROBOTIC ASSISTED LAPAROSCOPIC RADICAL PROSTATECTOMY WITH INDOCYANINE GREEN DYE;  Surgeon: Alexis Frock, MD;  Location: [facility];  Service: Urology;  Laterality: N/A;    FAMILY HISTORY Family History  Problem Relation Age of Onset  . Heart disease Mother   . Cancer Sister        breast  . Colon cancer Neg Hx   . Esophageal cancer Neg Hx   . Rectal cancer Neg Hx   . Stomach cancer Neg Hx     SOCIAL HISTORY Social History   Tobacco Use  . Smoking status: Current Some Day Smoker    Packs/day: 0.50    Years: 40.00    Pack years: 20.00    Types: Cigarettes  . Smokeless tobacco: Never Used  . Tobacco comment: smokes a couple every day or two  Substance Use Topics  . Alcohol use: No    Comment: Patient states no alcohol in 2 years.   . Drug use: Not Currently    Types: Marijuana, Cocaine    Comment: past hx approx 30 years ago          OPHTHALMIC EXAM:  Base Eye Exam    Visual Acuity (Snellen - Linear)      Right Left   Dist [place] 20/20 -2 20/80 -3   Dist ph [place]  NI       Tonometry (Tonopen, 9:01 AM)      Right Left   Pressure 19 14       Pupils      Dark Light  Shape React APD   Right 4 3 Round Slow None   Left 4 3 Round Slow None       Visual Fields (Counting fingers)      Left Right    Full Full       Extraocular Movement      Right Left    Full, Ortho Full, Ortho       Neuro/Psych    Oriented x3:  Yes   Mood/Affect:  Normal       Dilation  Both eyes:  1.0% Mydriacyl, 2.5% Phenylephrine @ 9:01 AM        Slit Lamp and Fundus Exam    Slit Lamp Exam      Right Left   Lids/Lashes Dermatochalasis - upper lid Dermatochalasis - upper lid   Conjunctiva/Sclera nasal Pinguecula nasal and temporal Pinguecula, Melanosis   Cornea Arcus, 1-2+ inferior Punctate epithelial erosions Arcus, 1-2+ inferior Punctate epithelial erosions   Anterior Chamber Deep and quiet Deep and quiet   Iris Round and dilated, No NVI, PPM Round and dilated, No NVI, vertical PPM from 1100-0700   Lens 2+ Nuclear sclerosis, 2-3+ Cortical cataract 2-3+ Nuclear sclerosis, 3+ Cortical cataract, +RBC's on PC   Vitreous Vitreous syneresis, Posterior vitreous detachment Vitreous syneresis, +RBC, diffuse VH improving and settling inferiorly; blood clots turning white and settling inferiorly       Fundus Exam      Right Left   Disc Pink and Sharp Pink and Sharp   C/D Ratio 0.5 0.4   Macula Flat, good foveal reflex, Retinal pigment epithelial mottling, rare MA, no edema Flat, blunted foveal reflex, Scattered IRH, mild Epiretinal membrane,exudates   Vessels Vascular attenuation, Tortuous, AV crossing changes Vascular attenuation   Periphery Attached, scattered DBH Vit heme-turning white, clearing and settling inferiorly. Attached superiorly, temporally and nasally, scattered DBH        Refraction    Wearing Rx      Sphere   Right None   Left None       Manifest Refraction      Sphere Cylinder Dist VA   Right      Left +1.00 Sphere 20/70          IMAGING AND PROCEDURES  Imaging and Procedures for @TODAY @  OCT, Retina - OU - Both Eyes       Right  Eye Quality was good. Central Foveal Thickness: 270. Progression has been stable. Findings include normal foveal contour, no SRF, no IRF (Irregular lamination, no DME).   Left Eye Quality was poor. Central Foveal Thickness: 348. Progression has improved. Findings include no SRF, normal foveal contour, epiretinal membrane, outer retinal atrophy, intraretinal fluid, intraretinal hyper-reflective material, macular pucker (Interval improvement in vitreous opacities).   Notes *Images captured and stored on drive  Diagnosis / Impression:  OD: NFP, no IRF/SRF OS: Abnormal foveal profile, +ERM/pucker, mild DME-interval improvement in vitreous opacities   Clinical management:  See below  Abbreviations: NFP - Normal foveal profile. CME - innmacular edema. PED - pigment epithelial detachment. IRF - intraretinal fluid. SRF - subretinal fluid. EZ - ellipsoid zone. ERM - epiretinal membrane. ORA - outer retinal atrophy. ORT - outer retinal tubulation. SRHM - subretinal hyper-reflective material                 ASSESSMENT/PLAN:    ICD-10-CM   1. Vitreous hemorrhage of left eye (HCC) H43.12   2. Moderate nonproliferative diabetic retinopathy of both eyes without macular edema associated with type 2 diabetes mellitus ([place]) W23.7628   3. Retinal edema H35.81 OCT, Retina - OU - Both Eyes  4. Essential hypertension I10   5. Hypertensive retinopathy of both eyes H35.033   6. Combined forms of age-related cataract of both eyes H25.813     1. Vitreous Hemorrhage OS -- improving  - onset of floaters several weeks prior to presentation and significant vision loss noted around 04.06.20 by pt history  - unclear etiology, but suspect related to history of DM and HTN  - s/p IVA  OS #1 (04.10.20)  - VH clearing and settling inferiorly  - peripheral exam shows no RT/RD OS  - OCT shows mild DME and ERM OS  - discussed findings and treatment options  - VH precautions reviewed -- minimize activities,  keep head elevated, avoid                 ASA/NSAIDs/blood thinners as able  - f/u 2 weeks, sooner prn -- DFE/OCT/possible FA/ possible injection  2,3. Moderate nonproliferative diabetic retinopathy w/o DME, OU  - The incidence, risk factors for progression, natural history and treatment options for diabetic retinopathy were discussed with patient.    - The need for close monitoring of blood glucose, blood pressure, and serum lipids, avoiding cigarette or any type of tobacco, and the need for long term follow up was also discussed with patient.  - exam shows scattered DBH OU  - OCT without diabetic macular edema, OD -- OS no view of macula  - will need FA once VH clears  4,5. Hypertensive retinopathy OU  - discussed importance of tight BP control  - discussed possible contribution to [facility] OS  - monitor  6. Mixed form age related cataracts OU  - The symptoms of cataract, surgical options, and treatments and risks were discussed with patient.  - discussed diagnosis and progression  - not yet visually significant  - monitor for now   Ophthalmic Meds Ordered this visit:  No orders of the defined types were placed in this encounter.      Return in about 2 weeks (around 08/31/2018) for DFE, OCT.  There are no Patient Instructions on file for this visit.   Explained the diagnoses, plan, and follow up with the patient and they expressed understanding.  Patient expressed understanding of the importance of proper follow up care.   This document serves as a record of services personally performed by Gardiner Sleeper, MD, PhD. It was created on their behalf by Ernest Mallick, OA, an ophthalmic assistant. The creation of this record is the provider's dictation and/or activities during the visit.    Electronically signed by: Ernest Mallick, OA  04.29.2020 12:22 AM    Gardiner Sleeper, M.D., Ph.D. Diseases & Surgery of the Retina and Vitreous [facility]  I have reviewed  the above documentation for accuracy and completeness, and I agree with the above. Gardiner Sleeper, M.D., Ph.D. 08/18/18 12:23 AM    Abbreviations: M myopia (nearsighted); A astigmatism; H hyperopia (farsighted); P presbyopia; Mrx spectacle prescription;  CTL contact lenses; OD right eye; OS left eye; OU both eyes  XT exotropia; ET esotropia; PEK punctate epithelial keratitis; PEE punctate epithelial erosions; DES dry eye syndrome; MGD meibomian gland dysfunction; ATs artificial tears; PFAT's preservative free artificial tears; [place] nuclear sclerotic cataract; PSC posterior subcapsular cataract; ERM epi-retinal membrane; PVD posterior vitreous detachment; RD retinal detachment; DM diabetes mellitus; DR diabetic retinopathy; NPDR non-proliferative diabetic retinopathy; PDR proliferative diabetic retinopathy; CSME clinically significant macular edema; DME diabetic macular edema; dbh dot blot hemorrhages; CWS cotton wool spot; POAG primary open angle glaucoma; C/D cup-to-disc ratio; HVF humphrey visual field; GVF goldmann visual field; OCT optical coherence tomography; IOP intraocular pressure; BRVO Branch retinal vein occlusion; CRVO central retinal vein occlusion; CRAO central retinal artery occlusion; BRAO branch retinal artery occlusion; RT retinal tear; SB scleral buckle; PPV pars plana vitrectomy; VH Vitreous hemorrhage; PRP panretinal laser photocoagulation; IVK intravitreal kenalog; VMT vitreomacular traction; MH Macular hole;  NVD neovascularization of the  disc; NVE neovascularization elsewhere; AREDS age related eye disease study; ARMD age related macular degeneration; POAG primary open angle glaucoma; EBMD epithelial/anterior basement membrane dystrophy; ACIOL anterior chamber intraocular lens; IOL intraocular lens; PCIOL posterior chamber intraocular lens; Phaco/IOL phacoemulsification with intraocular lens placement; [place] photorefractive keratectomy; LASIK laser assisted in situ keratomileusis; HTN  hypertension; DM diabetes mellitus; COPD chronic obstructive pulmonary disease

## 2018-08-17 ENCOUNTER — Encounter (INDEPENDENT_AMBULATORY_CARE_PROVIDER_SITE_OTHER): Payer: Self-pay | Admitting: Ophthalmology

## 2018-08-17 ENCOUNTER — Ambulatory Visit (INDEPENDENT_AMBULATORY_CARE_PROVIDER_SITE_OTHER): Payer: Medicare HMO | Admitting: Ophthalmology

## 2018-08-17 ENCOUNTER — Other Ambulatory Visit: Payer: Self-pay

## 2018-08-17 DIAGNOSIS — E113393 Type 2 diabetes mellitus with moderate nonproliferative diabetic retinopathy without macular edema, bilateral: Secondary | ICD-10-CM

## 2018-08-17 DIAGNOSIS — H4312 Vitreous hemorrhage, left eye: Secondary | ICD-10-CM | POA: Diagnosis not present

## 2018-08-17 DIAGNOSIS — I10 Essential (primary) hypertension: Secondary | ICD-10-CM

## 2018-08-17 DIAGNOSIS — H25813 Combined forms of age-related cataract, bilateral: Secondary | ICD-10-CM

## 2018-08-17 DIAGNOSIS — H35033 Hypertensive retinopathy, bilateral: Secondary | ICD-10-CM

## 2018-08-17 DIAGNOSIS — H3581 Retinal edema: Secondary | ICD-10-CM | POA: Diagnosis not present

## 2018-08-22 ENCOUNTER — Ambulatory Visit: Payer: 59 | Admitting: Sports Medicine

## 2018-08-30 NOTE — Progress Notes (Signed)
[facility] Note  08/31/2018     CHIEF COMPLAINT Patient presents for Retina Evaluation   HISTORY OF PRESENT ILLNESS: Gregory Nunez is a 73 y.o. male who presents to the clinic today for:   HPI    Retina Evaluation    In left eye.  This started weeks ago.  Duration of weeks.  Associated Symptoms Floaters.  Response to treatment was mild improvement.  I, the attending physician,  performed the HPI with the patient and updated documentation appropriately.          Comments    Patient states he feels as if his vision is gradually improving OS.  He occasionally has pain OU and occasionally has intermittent floaters OU.  Patient denies flashes of light.       Last edited by Bernarda Caffey, MD on 08/31/2018  8:56 AM. (History)    pt states he feels like his vision is clearing up, pt states he is sleeping with his head elevated  Referring physician:   HISTORICAL INFORMATION:   Selected notes from the MEDICAL RECORD NUMBER Referred by ED for RD OS LEE:  Ocular Hx- PMH-DM [takes glimeperide (A1c: 5.6, 04/16/18) BS: 239 (04.10.20)]    CURRENT MEDICATIONS: No current outpatient medications on file. (Ophthalmic Drugs)   No current facility-administered medications for this visit.  (Ophthalmic Drugs)   Current Outpatient Medications (Other)  Medication Sig  . ferrous sulfate 325 (65 FE) MG tablet Take 1 tablet (325 mg total) by mouth 2 (two) times daily with a meal. (Patient taking differently: Take 325 mg by mouth daily. )  . gabapentin (NEURONTIN) 300 MG capsule Take 300 mg by mouth 3 (three) times daily.  Marland Kitchen glimepiride (AMARYL) 4 MG tablet Take 4 mg by mouth every evening.   . Ipratropium-Albuterol (COMBIVENT RESPIMAT) 20-100 MCG/ACT AERS respimat Inhale 1 puff into the lungs every 6 (six) hours as needed for wheezing or shortness of breath.  . losartan (COZAAR) 100 MG tablet Take 100 mg by mouth every evening.   . mometasone-formoterol (DULERA)  100-5 MCG/ACT AERO Inhale 2 puffs into the lungs daily.  . pantoprazole (PROTONIX) 40 MG tablet Take 1 tablet (40 mg total) by mouth daily.  . sucralfate (CARAFATE) 1 g tablet Take 1 tablet (1 g total) by mouth 4 (four) times daily.  Marland Kitchen tiotropium (SPIRIVA HANDIHALER) 18 MCG inhalation capsule Place 1 capsule (18 mcg total) into inhaler and inhale daily.  . traZODone (DESYREL) 50 MG tablet Take 50 mg by mouth at bedtime as needed for sleep.   No current facility-administered medications for this visit.  (Other)      REVIEW OF SYSTEMS: ROS    Positive for: Endocrine, Eyes   Negative for: Constitutional, Gastrointestinal, Neurological, Skin, Genitourinary, Musculoskeletal, HENT, Cardiovascular, Respiratory, Psychiatric, Allergic/Imm, Heme/Lymph   Last edited by Doneen Poisson on 08/31/2018  8:49 AM. (History)       ALLERGIES Allergies  Allergen Reactions  . Penicillins Anxiety and Other (See Comments)    DID THE REACTION INVOLVE: Swelling of the face/tongue/throat, SOB, or low BP? No Sudden or severe rash/hives, skin peeling, or the inside of the mouth or nose? No Did it require medical treatment? No When did it last happen?many years ago If all above answers are "NO", may proceed with cephalosporin use.       PAST MEDICAL HISTORY Past Medical History:  Diagnosis Date  . Depression   . Diabetes mellitus without complication ([place])   .  GERD (gastroesophageal reflux disease)    if drinks alcohol  . 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 Hyperlipidemia   . Hypertension   . Malignant neoplasm of prostate ([place]) 01/09/2014  . Pancreatitis   . Prostate cancer ([place]) 12/19/13   Gleason 4+3=7, volume 31.31 cc  . Shortness of breath dyspnea    with exertion    Past Surgical History:  Procedure Laterality Date  . BIOPSY  04/16/2018   Procedure: BIOPSY;  Surgeon: Juanita Craver, MD;  Location: [facility]  ENDOSCOPY;  Service: Endoscopy;;  . biopsy on throat     hx of   . ESOPHAGOGASTRODUODENOSCOPY Left 04/16/2018   Procedure: ESOPHAGOGASTRODUODENOSCOPY (EGD);  Surgeon: Juanita Craver, MD;  Location: [facility];  Service: Endoscopy;  Laterality: Left;  . FOOT SURGERY    . HOT HEMOSTASIS N/A 04/16/2018   Procedure: HOT HEMOSTASIS (ARGON PLASMA COAGULATION/BICAP);  Surgeon: Juanita Craver, MD;  Location: [facility];  Service: Endoscopy;  Laterality: N/A;  . LYMPHADENECTOMY Bilateral 02/27/2014   Procedure: BILATERAL LYMPHADENECTOMY;  Surgeon: Alexis Frock, MD;  Location: [facility] ORS;  Service: Urology;  Laterality: Bilateral;  . PROSTATE BIOPSY  12/2013   Gleason 4+3=7, volume 31.31 cc  . ROBOT ASSISTED LAPAROSCOPIC RADICAL PROSTATECTOMY N/A 02/27/2014   Procedure: ROBOTIC ASSISTED LAPAROSCOPIC RADICAL PROSTATECTOMY WITH INDOCYANINE GREEN DYE;  Surgeon: Alexis Frock, MD;  Location: [facility] ORS;  Service: Urology;  Laterality: N/A;    FAMILY HISTORY Family History  Problem Relation Age of Onset  . Heart disease Mother   . Cancer Sister        breast  . Colon cancer Neg Hx   . Esophageal cancer Neg Hx   . Rectal cancer Neg Hx   . Stomach cancer Neg Hx     SOCIAL HISTORY Social History   Tobacco Use  . Smoking status: Current Some Day Smoker    Packs/day: 0.50    Years: 40.00    Pack years: 20.00    Types: Cigarettes  . Smokeless tobacco: Never Used  . Tobacco comment: smokes a couple every day or two  Substance Use Topics  . Alcohol use: No    Comment: Patient states no alcohol in 2 years.   . Drug use: Not Currently    Types: Marijuana, Cocaine    Comment: past hx approx 30 years ago          OPHTHALMIC EXAM:  Base Eye Exam    Visual Acuity (Snellen - Linear)      Right Left   Dist [place] 20/30 -2 20/80 -3   Dist ph [place] 20/20 -2        Tonometry (Tonopen, 8:53 AM)      Right Left   Pressure 22 17       Pupils      Dark Light Shape React APD   Right 3 2 Round Minimal 0    Left 3 2 Round Minimal 0       Extraocular Movement      Right Left    Full Full       Neuro/Psych    Oriented x3:  Yes   Mood/Affect:  Normal       Dilation    Both eyes:  1.0% Mydriacyl, 2.5% Phenylephrine @ 8:53 AM        Slit Lamp and Fundus Exam    Slit Lamp Exam      Right Left   Lids/Lashes Dermatochalasis -  upper lid Dermatochalasis - upper lid   Conjunctiva/Sclera nasal Pinguecula nasal and temporal Pinguecula, Melanosis   Cornea Arcus, 1-2+ inferior Punctate epithelial erosions Arcus, 2+ inferior Punctate epithelial erosions   Anterior Chamber Deep, Narrow temporal angle Deep, narrow temporal angle   Iris Round and dilated, No NVI, PPM Round and dilated, No NVI, vertical PPM from 1100-0700   Lens 2+ Nuclear sclerosis, 2-3+ Cortical cataract 2-3+ Nuclear sclerosis, 3+ Cortical cataract, mild heme on PC temporally   Vitreous Vitreous syneresis, Posterior vitreous detachment Vitreous syneresis, +RBC, diffuse VH improving and settling inferiorly; blood clots turning white and settling inferiorly       Fundus Exam      Right Left   Disc Pink and Sharp sharp rim, mild pallor   C/D Ratio 0.5 0.4   Macula Flat, good foveal reflex, mild Epiretinal membrane, No heme or edema Blunted foveal reflex, +Exudates/edma superior macula, scattered MA, Epiretinal membrane   Vessels Vascular attenuation, Copper wiring Vascular attenuation   Periphery Attached, scattered IRH posteriorly Vit heme-turning white, clearing and settling inferiorly. Attached superiorly, temporally and nasally, scattered DBH        Refraction    Wearing Rx      Sphere   Right None   Left None          IMAGING AND PROCEDURES  Imaging and Procedures for @TODAY @  OCT, Retina - OU - Both Eyes       Right Eye Quality was good. Central Foveal Thickness: 271. Progression has been stable. Findings include normal foveal contour, no SRF, no IRF (Irregular lamination, no DME).   Left Eye Quality was  poor. Central Foveal Thickness: 323. Progression has improved. Findings include no SRF, normal foveal contour, epiretinal membrane, outer retinal atrophy, intraretinal fluid, intraretinal hyper-reflective material, macular pucker (Interval improvement in vitreous opacities).   Notes *Images captured and stored on drive  Diagnosis / Impression:  OD: NFP, no IRF/SRF OS: Abnormal foveal profile, +ERM/pucker, mild DME-interval improvement in vitreous opacities   Clinical management:  See below  Abbreviations: NFP - Normal foveal profile. CME - innmacular edema. PED - pigment epithelial detachment. IRF - intraretinal fluid. SRF - subretinal fluid. EZ - ellipsoid zone. ERM - epiretinal membrane. ORA - outer retinal atrophy. ORT - outer retinal tubulation. SRHM - subretinal hyper-reflective material        Intravitreal Injection, Pharmacologic Agent - OS - Left Eye       Time Out 08/31/2018. 10:21 AM. Confirmed correct patient, procedure, site, and patient consented.   Anesthesia Topical anesthesia was used. Anesthetic medications included Lidocaine 2%, Proparacaine 0.5%.   Procedure Preparation included 5% betadine to ocular surface, eyelid speculum. A 30 gauge needle was used.   Injection:  1.25 mg Bevacizumab (AVASTIN) SOLN   NDC: 82423-536-14, Lot: [id number] , Expiration date: 09/29/2018   Route: Intravitreal, Site: Left Eye, Waste: 0 mg  Post-op Post injection exam found visual acuity of at least counting fingers. The patient tolerated the procedure well. There were no complications. The patient received written and verbal post procedure care education.        Fluorescein Angiography Optos (Transit OS)       Right Eye   Progression has no prior data. Early phase findings include microaneurysm, vascular perfusion defect. Mid/Late phase findings include vascular perfusion defect, microaneurysm, staining.   Left Eye   Progression has no prior data. Early phase  findings include microaneurysm, blockage, vascular perfusion defect. Mid/Late phase findings include blockage, microaneurysm, vascular perfusion defect, leakage (  No obvious NV).   Notes Images stored on drive;   Impression: OD: moderate NPDR OS: severe NPDR with VH                  ASSESSMENT/PLAN:    ICD-10-CM   1. Vitreous hemorrhage of left eye (HCC) H43.12 Intravitreal Injection, Pharmacologic Agent - OS - Left Eye    Fluorescein Angiography Optos (Transit OS)    Bevacizumab (AVASTIN) SOLN 1.25 mg  2. Moderate nonproliferative diabetic retinopathy of both eyes with macular edema associated with type 2 diabetes mellitus (HCC) R51.8841 Intravitreal Injection, Pharmacologic Agent - OS - Left Eye    Fluorescein Angiography Optos (Transit OS)    Bevacizumab (AVASTIN) SOLN 1.25 mg  3. Retinal edema H35.81 OCT, Retina - OU - Both Eyes  4. Essential hypertension I10   5. Hypertensive retinopathy of both eyes H35.033 Fluorescein Angiography Optos (Transit OS)  6. Combined forms of age-related cataract of both eyes H25.813     1. Vitreous Hemorrhage OS -- improving  - onset of floaters several weeks prior to presentation and significant vision loss noted around 04.06.20 by pt history  - unclear etiology, but suspect related to history of DM and HTN  - s/p IVA OS #1 (04.10.20)  - VH clearing, turning white and settling inferiorly  - peripheral exam shows no RT/RD OS  - OCT shows mild DME and ERM OS  - FA (05.14.20) shows vascular perfusion defects and late leaking MA, but no NV OS  - discussed findings and treatment options  - recommend IVA OS #2 (05.14.20) for vitreous hemorrhage and DME  - RBA of procedure discussed, questions answered  - informed consent obtained and signed  - see procedure note  - VH precautions reviewed -- minimize activities, keep head elevated, avoid ASA/NSAIDs/blood thinners as able  - f/u 4 weeks, sooner prn -- DFE/OCT/ possible injection  2,3.  Moderate nonproliferative diabetic retinopathy w/ DME, OU  - The incidence, risk factors for progression, natural history and treatment options for diabetic retinopathy were discussed with patient.    - The need for close monitoring of blood glucose, blood pressure, and serum lipids, avoiding cigarette or any type of tobacco, and the need for long term follow up was also discussed with patient.  - exam shows scattered DBH OU  - OCT without diabetic macular edema, OD; +DME OS  - FA 5.14.20 shows late leaking MA OU, no NV OU  4,5. Hypertensive retinopathy OU  - discussed importance of tight BP control  - discussed possible contribution to [facility] OS  - monitor  6. Mixed form age related cataracts OU  - The symptoms of cataract, surgical options, and treatments and risks were discussed with patient.  - discussed diagnosis and progression  - not yet visually significant  - monitor for now   Ophthalmic Meds Ordered this visit:  Meds ordered this encounter  Medications  . Bevacizumab (AVASTIN) SOLN 1.25 mg       Return in about 4 weeks (around 09/28/2018) for f/u VH OS, DFE, OCT.  There are no Patient Instructions on file for this visit.   Explained the diagnoses, plan, and follow up with the patient and they expressed understanding.  Patient expressed understanding of the importance of proper follow up care.   This document serves as a record of services personally performed by Gardiner Sleeper, MD, PhD. It was created on their behalf by Ernest Mallick, OA, an ophthalmic assistant. The creation of this record is  the provider's dictation and/or activities during the visit.    Electronically signed by: Ernest Mallick, OA  05.13.2020 10:41 AM    Gardiner Sleeper, M.D., Ph.D. Diseases & Surgery of the Retina and Vitreous [facility]  I have reviewed the above documentation for accuracy and completeness, and I agree with the above. Gardiner Sleeper, M.D., Ph.D. 08/31/18  10:41 AM    Abbreviations: M myopia (nearsighted); A astigmatism; H hyperopia (farsighted); P presbyopia; Mrx spectacle prescription;  CTL contact lenses; OD right eye; OS left eye; OU both eyes  XT exotropia; ET esotropia; PEK punctate epithelial keratitis; PEE punctate epithelial erosions; DES dry eye syndrome; MGD meibomian gland dysfunction; ATs artificial tears; PFAT's preservative free artificial tears; [place] nuclear sclerotic cataract; PSC posterior subcapsular cataract; ERM epi-retinal membrane; PVD posterior vitreous detachment; RD retinal detachment; DM diabetes mellitus; DR diabetic retinopathy; NPDR non-proliferative diabetic retinopathy; PDR proliferative diabetic retinopathy; CSME clinically significant macular edema; DME diabetic macular edema; dbh dot blot hemorrhages; CWS cotton wool spot; POAG primary open angle glaucoma; C/D cup-to-disc ratio; HVF humphrey visual field; GVF goldmann visual field; OCT optical coherence tomography; IOP intraocular pressure; BRVO Branch retinal vein occlusion; CRVO central retinal vein occlusion; CRAO central retinal artery occlusion; BRAO branch retinal artery occlusion; RT retinal tear; SB scleral buckle; PPV pars plana vitrectomy; VH Vitreous hemorrhage; PRP panretinal laser photocoagulation; IVK intravitreal kenalog; VMT vitreomacular traction; MH Macular hole;  NVD neovascularization of the disc; NVE neovascularization elsewhere; AREDS age related eye disease study; ARMD age related macular degeneration; POAG primary open angle glaucoma; EBMD epithelial/anterior basement membrane dystrophy; ACIOL anterior chamber intraocular lens; IOL intraocular lens; PCIOL posterior chamber intraocular lens; Phaco/IOL phacoemulsification with intraocular lens placement; [place] photorefractive keratectomy; LASIK laser assisted in situ keratomileusis; HTN hypertension; DM diabetes mellitus; COPD chronic obstructive pulmonary disease

## 2018-08-31 ENCOUNTER — Other Ambulatory Visit: Payer: Self-pay

## 2018-08-31 ENCOUNTER — Encounter (INDEPENDENT_AMBULATORY_CARE_PROVIDER_SITE_OTHER): Payer: Self-pay | Admitting: Ophthalmology

## 2018-08-31 ENCOUNTER — Ambulatory Visit (INDEPENDENT_AMBULATORY_CARE_PROVIDER_SITE_OTHER): Payer: Medicare HMO | Admitting: Ophthalmology

## 2018-08-31 DIAGNOSIS — H3581 Retinal edema: Secondary | ICD-10-CM

## 2018-08-31 DIAGNOSIS — H4312 Vitreous hemorrhage, left eye: Secondary | ICD-10-CM

## 2018-08-31 DIAGNOSIS — H25813 Combined forms of age-related cataract, bilateral: Secondary | ICD-10-CM

## 2018-08-31 DIAGNOSIS — H35033 Hypertensive retinopathy, bilateral: Secondary | ICD-10-CM | POA: Diagnosis not present

## 2018-08-31 DIAGNOSIS — E113313 Type 2 diabetes mellitus with moderate nonproliferative diabetic retinopathy with macular edema, bilateral: Secondary | ICD-10-CM | POA: Diagnosis not present

## 2018-08-31 DIAGNOSIS — I10 Essential (primary) hypertension: Secondary | ICD-10-CM

## 2018-08-31 MED ORDER — BEVACIZUMAB CHEMO INJECTION 1.25MG/0.05ML SYRINGE FOR KALEIDOSCOPE
1.2500 mg | INTRAVITREAL | Status: AC | PRN
  Administered 2018-08-31: 1.25 mg via INTRAVITREAL

## 2018-09-26 NOTE — Progress Notes (Signed)
[facility] Note  09/28/2018     CHIEF COMPLAINT Patient presents for Retina Follow Up   HISTORY OF PRESENT ILLNESS: Gregory Nunez is a 73 y.o. male who presents to the clinic today for:   HPI    Retina Follow Up    Patient presents with  Other.  In left eye.  This started 1 month ago.  Severity is mild.  Since onset it is gradually improving.  I, the attending physician,  performed the HPI with the patient and updated documentation appropriately.          Comments    F/U [facility] OS. Patient states "I feel my vision has gotten better", denies floaters, flashes and ocular pain. Pt is ready for tx today if indicted.       Last edited by Bernarda Caffey, MD on 09/28/2018  8:59 AM. (History)    pt states he feels like his vision getting better, he states he does not see the floaters anymore  Referring physician:   HISTORICAL INFORMATION:   Selected notes from the MEDICAL RECORD NUMBER Referred by ED for RD OS LEE:  Ocular Hx- PMH-DM [takes glimeperide (A1c: 5.6, 04/16/18) BS: 239 (04.10.20)]    CURRENT MEDICATIONS: No current outpatient medications on file. (Ophthalmic Drugs)   No current facility-administered medications for this visit.  (Ophthalmic Drugs)   Current Outpatient Medications (Other)  Medication Sig  . ferrous sulfate 325 (65 FE) MG tablet Take 1 tablet (325 mg total) by mouth 2 (two) times daily with a meal. (Patient taking differently: Take 325 mg by mouth daily. )  . gabapentin (NEURONTIN) 300 MG capsule Take 300 mg by mouth 3 (three) times daily.  Marland Kitchen glimepiride (AMARYL) 4 MG tablet Take 4 mg by mouth every evening.   . Ipratropium-Albuterol (COMBIVENT RESPIMAT) 20-100 MCG/ACT AERS respimat Inhale 1 puff into the lungs every 6 (six) hours as needed for wheezing or shortness of breath.  . losartan (COZAAR) 100 MG tablet Take 100 mg by mouth every evening.   . mometasone-formoterol (DULERA) 100-5 MCG/ACT AERO Inhale 2 puffs into the  lungs daily.  . pantoprazole (PROTONIX) 40 MG tablet Take 1 tablet (40 mg total) by mouth daily.  . sucralfate (CARAFATE) 1 g tablet Take 1 tablet (1 g total) by mouth 4 (four) times daily.  Marland Kitchen tiotropium (SPIRIVA HANDIHALER) 18 MCG inhalation capsule Place 1 capsule (18 mcg total) into inhaler and inhale daily.  . traZODone (DESYREL) 50 MG tablet Take 50 mg by mouth at bedtime as needed for sleep.   No current facility-administered medications for this visit.  (Other)      REVIEW OF SYSTEMS: ROS    Positive for: Eyes   Negative for: Constitutional, Gastrointestinal, Neurological, Skin, Genitourinary, Musculoskeletal, HENT, Endocrine, Cardiovascular, Respiratory, Psychiatric, Allergic/Imm, Heme/Lymph   Last edited by Zenovia Jordan, LPN on 8/85/0277  4:12 AM. (History)       ALLERGIES Allergies  Allergen Reactions  . Penicillins Anxiety and Other (See Comments)    DID THE REACTION INVOLVE: Swelling of the face/tongue/throat, SOB, or low BP? No Sudden or severe rash/hives, skin peeling, or the inside of the mouth or nose? No Did it require medical treatment? No When did it last happen?many years ago If all above answers are "NO", may proceed with cephalosporin use.       PAST MEDICAL HISTORY Past Medical History:  Diagnosis Date  . Depression   . Diabetes mellitus without complication ([place])   .  GERD (gastroesophageal reflux disease)    if drinks alcohol  . 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 Hyperlipidemia   . Hypertension   . Malignant neoplasm of prostate ([place]) 01/09/2014  . Pancreatitis   . Prostate cancer ([place]) 12/19/13   Gleason 4+3=7, volume 31.31 cc  . Shortness of breath dyspnea    with exertion    Past Surgical History:  Procedure Laterality Date  . BIOPSY  04/16/2018   Procedure: BIOPSY;  Surgeon: Juanita Craver, MD;  Location: [facility];  Service: Endoscopy;;  . biopsy  on throat     hx of   . ESOPHAGOGASTRODUODENOSCOPY Left 04/16/2018   Procedure: ESOPHAGOGASTRODUODENOSCOPY (EGD);  Surgeon: Juanita Craver, MD;  Location: [facility];  Service: Endoscopy;  Laterality: Left;  . FOOT SURGERY    . HOT HEMOSTASIS N/A 04/16/2018   Procedure: HOT HEMOSTASIS (ARGON PLASMA COAGULATION/BICAP);  Surgeon: Juanita Craver, MD;  Location: [facility];  Service: Endoscopy;  Laterality: N/A;  . LYMPHADENECTOMY Bilateral 02/27/2014   Procedure: BILATERAL LYMPHADENECTOMY;  Surgeon: Alexis Frock, MD;  Location: [facility];  Service: Urology;  Laterality: Bilateral;  . PROSTATE BIOPSY  12/2013   Gleason 4+3=7, volume 31.31 cc  . ROBOT ASSISTED LAPAROSCOPIC RADICAL PROSTATECTOMY N/A 02/27/2014   Procedure: ROBOTIC ASSISTED LAPAROSCOPIC RADICAL PROSTATECTOMY WITH INDOCYANINE GREEN DYE;  Surgeon: Alexis Frock, MD;  Location: [facility];  Service: Urology;  Laterality: N/A;    FAMILY HISTORY Family History  Problem Relation Age of Onset  . Heart disease Mother   . Cancer Sister        breast  . Colon cancer Neg Hx   . Esophageal cancer Neg Hx   . Rectal cancer Neg Hx   . Stomach cancer Neg Hx     SOCIAL HISTORY Social History   Tobacco Use  . Smoking status: Current Some Day Smoker    Packs/day: 0.50    Years: 40.00    Pack years: 20.00    Types: Cigarettes  . Smokeless tobacco: Never Used  . Tobacco comment: smokes a couple every day or two  Substance Use Topics  . Alcohol use: No    Comment: Patient states no alcohol in 2 years.   . Drug use: Not Currently    Types: Marijuana, Cocaine    Comment: past hx approx 30 years ago          OPHTHALMIC EXAM:  Base Eye Exam    Visual Acuity (Snellen - Linear)      Right Left   Dist [place] 20/30 20/100   Dist ph [place] 20/25 -1 20/70 -1       Tonometry (Tonopen, 8:30 AM)      Right Left   Pressure 16 18       Pupils      Dark Light Shape React APD   Right 3 2 Round Brisk None   Left 3 2 Round Brisk None        Visual Fields      Left Right    Full Full       Extraocular Movement      Right Left    Full, Ortho Full, Ortho       Neuro/Psych    Oriented x3: Yes   Mood/Affect: Normal       Dilation    Both eyes: 1.0% Mydriacyl, 2.5% Phenylephrine @ 8:30 AM        Slit Lamp and Fundus  Exam    Slit Lamp Exam      Right Left   Lids/Lashes Dermatochalasis - upper lid Dermatochalasis - upper lid   Conjunctiva/Sclera nasal Pinguecula nasal and temporal Pinguecula, Melanosis   Cornea Arcus, 1-2+ inferior Punctate epithelial erosions Arcus, 2+ inferior Punctate epithelial erosions   Anterior Chamber Deep, Narrow temporal angle Deep, narrow temporal angle   Iris Round and dilated, No NVI, PPM Round and dilated, No NVI, vertical PPM from 1100-0700   Lens 2+ Nuclear sclerosis, 3+ Cortical cataract 2-3+ Nuclear sclerosis, 3+ Cortical cataract, mild heme on PC temporally   Vitreous Vitreous syneresis, Posterior vitreous detachment Vitreous syneresis, +RBC, diffuse VH improving and settling inferiorly; blood clots turning white and settling inferiorly       Fundus Exam      Right Left   Disc Pink and Sharp sharp rim, mild pallor   C/D Ratio 0.6 0.5   Macula Flat, good foveal reflex, mild Epiretinal membrane, No heme or edema Blunted foveal reflex, +scattered punctate Exudates, scattered MA, Epiretinal membrane, scattered DBH   Vessels Vascular attenuation, Copper wiring Vascular attenuation, AV crossing changes, Tortuous   Periphery Attached, scattered IRH posteriorly Attached, scattered DBH          IMAGING AND PROCEDURES  Imaging and Procedures for @TODAY @  OCT, Retina - OU - Both Eyes       Right Eye Quality was good. Central Foveal Thickness: 270. Progression has been stable. Findings include normal foveal contour, no SRF, no IRF (Irregular lamination, no DME).   Left Eye Quality was good. Central Foveal Thickness: 309. Progression has improved. Findings include no SRF, normal  foveal contour, epiretinal membrane, outer retinal atrophy, intraretinal fluid, intraretinal hyper-reflective material, macular pucker (Tr interval improvement in IRF).   Notes *Images captured and stored on drive  Diagnosis / Impression:  OD: NFP, no IRF/SRF OS: Abnormal foveal profile, +ERM/pucker, mild DME-tr interval improvement in IRF   Clinical management:  See below  Abbreviations: NFP - Normal foveal profile. CME - innmacular edema. PED - pigment epithelial detachment. IRF - intraretinal fluid. SRF - subretinal fluid. EZ - ellipsoid zone. ERM - epiretinal membrane. ORA - outer retinal atrophy. ORT - outer retinal tubulation. SRHM - subretinal hyper-reflective material        Intravitreal Injection, Pharmacologic Agent - OS - Left Eye       Time Out 09/28/2018. 8:46 AM. Confirmed correct patient, procedure, site, and patient consented.   Anesthesia Topical anesthesia was used. Anesthetic medications included Lidocaine 2%, Tetracaine 0.5%.   Procedure Preparation included 5% betadine to ocular surface, eyelid speculum. A 30 gauge needle was used.   Injection:  1.25 mg Bevacizumab (AVASTIN) SOLN   NDC: 44315-400-86, Lot: [id number] , Expiration date: 11/13/2018   Route: Intravitreal, Site: Left Eye, Waste: 0 mL  Post-op Post injection exam found visual acuity of at least counting fingers. The patient tolerated the procedure well. There were no complications. The patient received written and verbal post procedure care education.                 ASSESSMENT/PLAN:    ICD-10-CM   1. Vitreous hemorrhage of left eye (HCC)  H43.12 Intravitreal Injection, Pharmacologic Agent - OS - Left Eye    Bevacizumab (AVASTIN) SOLN 1.25 mg  2. Moderate nonproliferative diabetic retinopathy of both eyes with macular edema associated with type 2 diabetes mellitus (HCC)  11/26/2018 Intravitreal Injection, Pharmacologic Agent - OS - Left Eye    Bevacizumab (AVASTIN) SOLN 1.25 mg  3.  Retinal edema  H35.81 OCT, Retina - OU - Both Eyes  4. Essential hypertension  I10   5. Hypertensive retinopathy of both eyes  H35.033   6. Combined forms of age-related cataract of both eyes  H25.813     1. Vitreous Hemorrhage OS -- improving  - onset of floaters several weeks prior to presentation and significant vision loss noted around 04.06.20 by pt history  - unclear etiology, but suspect related to history of DM and HTN  - s/p IVA OS #1 (04.10.20), #2 (05.14.20)  - VH clearing, turning white and settling inferiorly  - peripheral exam shows no RT/RD OS  - OCT shows mild DME and ERM OS  - FA (05.14.20) shows vascular perfusion defects and late leaking MA, but no NV OS  - discussed findings and treatment options  - recommend IVA OS #3 (06.11.20) for vitreous hemorrhage and DME  - RBA of procedure discussed, questions answered  - informed consent obtained and signed  - see procedure note  - VH precautions reviewed -- minimize activities, keep head elevated, avoid ASA/NSAIDs/blood thinners as able  - f/u 4 weeks, sooner prn -- DFE/OCT/ possible injection  2,3. Moderate nonproliferative diabetic retinopathy w/ DME, OU  - The incidence, risk factors for progression, natural history and treatment options for diabetic retinopathy were discussed with patient.    - The need for close monitoring of blood glucose, blood pressure, and serum lipids, avoiding cigarette or any type of tobacco, and the need for long term follow up was also discussed with patient.  - exam shows scattered DBH OU  - OCT without diabetic macular edema, OD; +DME OS  - FA (5.14.20) shows late leaking MA OU, no NV OU  4,5. Hypertensive retinopathy OU  - discussed importance of tight BP control  - discussed possible contribution to [facility] OS  - monitor  6. Mixed form age related cataracts OU  - The symptoms of cataract, surgical options, and treatments and risks were discussed with patient.  - discussed diagnosis and  progression  - not yet visually significant  - monitor for now   Ophthalmic Meds Ordered this visit:  Meds ordered this encounter  Medications  . Bevacizumab (AVASTIN) SOLN 1.25 mg       Return in about 4 weeks (around 10/26/2018) for f/u mod NPDR w/ DME and clearing VH OS - Dilated Exam, OCT, Possible Injxn.  There are no Patient Instructions on file for this visit.   Explained the diagnoses, plan, and follow up with the patient and they expressed understanding.  Patient expressed understanding of the importance of proper follow up care.   This document serves as a record of services personally performed by Gardiner Sleeper, MD, PhD. It was created on their behalf by Ernest Mallick, OA, an ophthalmic assistant. The creation of this record is the provider's dictation and/or activities during the visit.    Electronically signed by: Ernest Mallick, OA  06.09.2020 8:59 AM     Gardiner Sleeper, M.D., Ph.D. Diseases & Surgery of the Retina and Vitreous [facility]   I have reviewed the above documentation for accuracy and completeness, and I agree with the above. Gardiner Sleeper, M.D., Ph.D. 09/28/18 9:00 AM   Abbreviations: M myopia (nearsighted); A astigmatism; H hyperopia (farsighted); P presbyopia; Mrx spectacle prescription;  CTL contact lenses; OD right eye; OS left eye; OU both eyes  XT exotropia; ET esotropia; PEK punctate epithelial keratitis; PEE punctate epithelial  erosions; DES dry eye syndrome; MGD meibomian gland dysfunction; ATs artificial tears; PFAT's preservative free artificial tears; [place] nuclear sclerotic cataract; PSC posterior subcapsular cataract; ERM epi-retinal membrane; PVD posterior vitreous detachment; RD retinal detachment; DM diabetes mellitus; DR diabetic retinopathy; NPDR non-proliferative diabetic retinopathy; PDR proliferative diabetic retinopathy; CSME clinically significant macular edema; DME diabetic macular edema; dbh dot blot  hemorrhages; CWS cotton wool spot; POAG primary open angle glaucoma; C/D cup-to-disc ratio; HVF humphrey visual field; GVF goldmann visual field; OCT optical coherence tomography; IOP intraocular pressure; BRVO Branch retinal vein occlusion; CRVO central retinal vein occlusion; CRAO central retinal artery occlusion; BRAO branch retinal artery occlusion; RT retinal tear; SB scleral buckle; PPV pars plana vitrectomy; VH Vitreous hemorrhage; PRP panretinal laser photocoagulation; IVK intravitreal kenalog; VMT vitreomacular traction; MH Macular hole;  NVD neovascularization of the disc; NVE neovascularization elsewhere; AREDS age related eye disease study; ARMD age related macular degeneration; POAG primary open angle glaucoma; EBMD epithelial/anterior basement membrane dystrophy; ACIOL anterior chamber intraocular lens; IOL intraocular lens; PCIOL posterior chamber intraocular lens; Phaco/IOL phacoemulsification with intraocular lens placement; [place] photorefractive keratectomy; LASIK laser assisted in situ keratomileusis; HTN hypertension; DM diabetes mellitus; COPD chronic obstructive pulmonary disease

## 2018-09-28 ENCOUNTER — Other Ambulatory Visit: Payer: Self-pay

## 2018-09-28 ENCOUNTER — Encounter (INDEPENDENT_AMBULATORY_CARE_PROVIDER_SITE_OTHER): Payer: Self-pay | Admitting: Ophthalmology

## 2018-09-28 ENCOUNTER — Ambulatory Visit (INDEPENDENT_AMBULATORY_CARE_PROVIDER_SITE_OTHER): Payer: Medicare HMO | Admitting: Ophthalmology

## 2018-09-28 DIAGNOSIS — H35033 Hypertensive retinopathy, bilateral: Secondary | ICD-10-CM

## 2018-09-28 DIAGNOSIS — H25813 Combined forms of age-related cataract, bilateral: Secondary | ICD-10-CM

## 2018-09-28 DIAGNOSIS — I10 Essential (primary) hypertension: Secondary | ICD-10-CM | POA: Diagnosis not present

## 2018-09-28 DIAGNOSIS — H4312 Vitreous hemorrhage, left eye: Secondary | ICD-10-CM | POA: Diagnosis not present

## 2018-09-28 DIAGNOSIS — E113313 Type 2 diabetes mellitus with moderate nonproliferative diabetic retinopathy with macular edema, bilateral: Secondary | ICD-10-CM | POA: Diagnosis not present

## 2018-09-28 DIAGNOSIS — H3581 Retinal edema: Secondary | ICD-10-CM

## 2018-09-28 MED ORDER — BEVACIZUMAB CHEMO INJECTION 1.25MG/0.05ML SYRINGE FOR KALEIDOSCOPE
1.2500 mg | INTRAVITREAL | Status: AC | PRN
  Administered 2018-09-28: 1.25 mg via INTRAVITREAL

## 2018-10-25 NOTE — Progress Notes (Signed)
[facility] Note  10/26/2018     CHIEF COMPLAINT Patient presents for Retina Follow Up   HISTORY OF PRESENT ILLNESS: Gregory Nunez is a 73 y.o. male who presents to the clinic today for:   HPI    Retina Follow Up    Patient presents with  Other.  In left eye.  This started 4 weeks ago.  Severity is moderate.  I, the attending physician,  performed the HPI with the patient and updated documentation appropriately.          Comments    Patient here for 4 weeks retina follow up for VH OS. Patient states vision is a lot better.  When comes here - gets shots. Gets a headache. Wants to know is there is an alternative to the shot. OS has like pressure. Used to be after shot. Now off/on after shot through the 4 weeks.        Last edited by Bernarda Caffey, MD on 10/26/2018  9:46 AM. (History)    pt states he feels like he is getting a headache after receiving injections, he states he did not take any medication for it,    Referring physician:   HISTORICAL INFORMATION:   Selected notes from the MEDICAL RECORD NUMBER Referred by ED for RD OS LEE:  Ocular Hx- PMH-DM [takes glimeperide (A1c: 5.6, 04/16/18) BS: 239 (04.10.20)]    CURRENT MEDICATIONS: No current outpatient medications on file. (Ophthalmic Drugs)   No current facility-administered medications for this visit.  (Ophthalmic Drugs)   Current Outpatient Medications (Other)  Medication Sig  . ferrous sulfate 325 (65 FE) MG tablet Take 1 tablet (325 mg total) by mouth 2 (two) times daily with a meal. (Patient taking differently: Take 325 mg by mouth daily. )  . gabapentin (NEURONTIN) 300 MG capsule Take 300 mg by mouth 3 (three) times daily.  Marland Kitchen glimepiride (AMARYL) 4 MG tablet Take 4 mg by mouth every evening.   . Ipratropium-Albuterol (COMBIVENT RESPIMAT) 20-100 MCG/ACT AERS respimat Inhale 1 puff into the lungs every 6 (six) hours as needed for wheezing or shortness of breath.  . losartan  (COZAAR) 100 MG tablet Take 100 mg by mouth every evening.   . mometasone-formoterol (DULERA) 100-5 MCG/ACT AERO Inhale 2 puffs into the lungs daily.  . pantoprazole (PROTONIX) 40 MG tablet Take 1 tablet (40 mg total) by mouth daily.  . sucralfate (CARAFATE) 1 g tablet Take 1 tablet (1 g total) by mouth 4 (four) times daily.  Marland Kitchen tiotropium (SPIRIVA HANDIHALER) 18 MCG inhalation capsule Place 1 capsule (18 mcg total) into inhaler and inhale daily.  . traZODone (DESYREL) 50 MG tablet Take 50 mg by mouth at bedtime as needed for sleep.   No current facility-administered medications for this visit.  (Other)      REVIEW OF SYSTEMS: ROS    Positive for: Endocrine, Eyes   Negative for: Constitutional, Gastrointestinal, Neurological, Skin, Genitourinary, Musculoskeletal, HENT, Cardiovascular, Respiratory, Psychiatric, Allergic/Imm, Heme/Lymph   Last edited by Theodore Demark on 10/26/2018  8:52 AM. (History)       ALLERGIES Allergies  Allergen Reactions  . Penicillins Anxiety and Other (See Comments)    DID THE REACTION INVOLVE: Swelling of the face/tongue/throat, SOB, or low BP? No Sudden or severe rash/hives, skin peeling, or the inside of the mouth or nose? No Did it require medical treatment? No When did it last happen?many years ago If all above answers are "NO", may proceed with  cephalosporin use.       PAST MEDICAL HISTORY Past Medical History:  Diagnosis Date  . Depression   . Diabetes mellitus without complication ([place])   . GERD (gastroesophageal reflux disease)    if drinks alcohol  . 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 Hyperlipidemia   . Hypertension   . Malignant neoplasm of prostate ([place]) 01/09/2014  . Pancreatitis   . Prostate cancer ([place]) 12/19/13   Gleason 4+3=7, volume 31.31 cc  . Shortness of breath dyspnea    with exertion    Past Surgical History:  Procedure Laterality  Date  . BIOPSY  04/16/2018   Procedure: BIOPSY;  Surgeon: Juanita Craver, MD;  Location: [facility];  Service: Endoscopy;;  . biopsy on throat     hx of   . ESOPHAGOGASTRODUODENOSCOPY Left 04/16/2018   Procedure: ESOPHAGOGASTRODUODENOSCOPY (EGD);  Surgeon: Juanita Craver, MD;  Location: [facility];  Service: Endoscopy;  Laterality: Left;  . FOOT SURGERY    . HOT HEMOSTASIS N/A 04/16/2018   Procedure: HOT HEMOSTASIS (ARGON PLASMA COAGULATION/BICAP);  Surgeon: Juanita Craver, MD;  Location: [facility];  Service: Endoscopy;  Laterality: N/A;  . LYMPHADENECTOMY Bilateral 02/27/2014   Procedure: BILATERAL LYMPHADENECTOMY;  Surgeon: Alexis Frock, MD;  Location: [facility];  Service: Urology;  Laterality: Bilateral;  . PROSTATE BIOPSY  12/2013   Gleason 4+3=7, volume 31.31 cc  . ROBOT ASSISTED LAPAROSCOPIC RADICAL PROSTATECTOMY N/A 02/27/2014   Procedure: ROBOTIC ASSISTED LAPAROSCOPIC RADICAL PROSTATECTOMY WITH INDOCYANINE GREEN DYE;  Surgeon: Alexis Frock, MD;  Location: [facility];  Service: Urology;  Laterality: N/A;    FAMILY HISTORY Family History  Problem Relation Age of Onset  . Heart disease Mother   . Cancer Sister        breast  . Colon cancer Neg Hx   . Esophageal cancer Neg Hx   . Rectal cancer Neg Hx   . Stomach cancer Neg Hx     SOCIAL HISTORY Social History   Tobacco Use  . Smoking status: Current Some Day Smoker    Packs/day: 0.50    Years: 40.00    Pack years: 20.00    Types: Cigarettes  . Smokeless tobacco: Never Used  . Tobacco comment: smokes a couple every day or two  Substance Use Topics  . Alcohol use: No    Comment: Patient states no alcohol in 2 years.   . Drug use: Not Currently    Types: Marijuana, Cocaine    Comment: past hx approx 30 years ago          OPHTHALMIC EXAM:  Base Eye Exam    Visual Acuity (Snellen - Linear)      Right Left   Dist [place] 20/30 -2 20/150 +2   Dist ph [place] 20/20 -1 20/70 -2       Tonometry (Tonopen, 8:47 AM)      Right  Left   Pressure 22 22  Patient a  Hard squeezer.       Pupils      Dark Light Shape React APD   Right 3 2 Round Brisk None   Left 3 2 Round Brisk None       Visual Fields (Counting fingers)      Left Right    Full Full       Extraocular Movement      Right Left    Full, Ortho Full, Ortho  Neuro/Psych    Oriented x3: Yes   Mood/Affect: Normal       Dilation    Both eyes: 1.0% Mydriacyl, 2.5% Phenylephrine @ 8:47 AM        Slit Lamp and Fundus Exam    Slit Lamp Exam      Right Left   Lids/Lashes Dermatochalasis - upper lid Dermatochalasis - upper lid   Conjunctiva/Sclera nasal Pinguecula nasal and temporal Pinguecula, Melanosis   Cornea Arcus, 1-2+ inferior Punctate epithelial erosions Arcus, 2+ inferior Punctate epithelial erosions   Anterior Chamber Deep, Narrow temporal angle Deep, narrow temporal angle   Iris Round and dilated, No NVI, PPM Round and dilated, No NVI, vertical PPM from 1100-0700   Lens 2+ Nuclear sclerosis, 3+ Cortical cataract 2-3+ Nuclear sclerosis, 3+ Cortical cataract, mild heme on PC temporally   Vitreous Vitreous syneresis, Posterior vitreous detachment Vitreous syneresis, white VH settling inferiorly       Fundus Exam      Right Left   Disc thin superior rim sharp rim, mild pallor   C/D Ratio 0.7 0.6   Macula Flat, good foveal reflex, mild Epiretinal membrane, RPE mottling, rare MA, no edema Blunted foveal reflex, scattered MA, Epiretinal membrane, scattered DBH - mostly superior macula   Vessels Vascular attenuation, Copper wiring Vascular attenuation, AV crossing changes, Tortuous   Periphery Attached, scattered IRH posteriorly, isolated blot heme inferior equator  Attached, scattered DBH greatest superiorly, inferior periphery obscured by old VH          IMAGING AND PROCEDURES  Imaging and Procedures for @TODAY @  OCT, Retina - OU - Both Eyes       Right Eye Quality was good. Central Foveal Thickness: 269. Progression has  been stable. Findings include normal foveal contour, no SRF, no IRF (Irregular lamination, no DME, diffuse thinning).   Left Eye Quality was good. Central Foveal Thickness: 307. Progression has been stable. Findings include no SRF, normal foveal contour, epiretinal membrane, outer retinal atrophy, intraretinal fluid, intraretinal hyper-reflective material, macular pucker (persistent IRF).   Notes *Images captured and stored on drive  Diagnosis / Impression:  OD: NFP, no IRF/SRF OS: Abnormal foveal profile, +ERM/pucker, mild DME-tr interval improvement in IRF   Clinical management:  See below  Abbreviations: NFP - Normal foveal profile. CME - innmacular edema. PED - pigment epithelial detachment. IRF - intraretinal fluid. SRF - subretinal fluid. EZ - ellipsoid zone. ERM - epiretinal membrane. ORA - outer retinal atrophy. ORT - outer retinal tubulation. SRHM - subretinal hyper-reflective material        Intravitreal Injection, Pharmacologic Agent - OS - Left Eye       Time Out 10/26/2018. 10:00 AM. Confirmed correct patient, procedure, site, and patient consented.   Anesthesia Anesthetic medications included Lidocaine 2%, Proparacaine 0.5%.   Procedure Preparation included 5% betadine to ocular surface, eyelid speculum. A supplied needle was used.   Injection:  1.25 mg Bevacizumab (AVASTIN) SOLN   NDC: 82505-397-67, Lot: [id number], Expiration date: 12/27/2018   Route: Intravitreal, Site: Left Eye, Waste: 0 mL  Post-op Post injection exam found visual acuity of at least counting fingers. The patient tolerated the procedure well. There were no complications. The patient received written and verbal post procedure care education.                 ASSESSMENT/PLAN:    ICD-10-CM   1. Vitreous hemorrhage of left eye (HCC)  H43.12 Intravitreal Injection, Pharmacologic Agent - OS - Left Eye  2. Moderate nonproliferative  diabetic retinopathy of both eyes with macular edema  associated with type 2 diabetes mellitus ([place])  Z12.4580   3. Retinal edema  H35.81 OCT, Retina - OU - Both Eyes  4. Essential hypertension  I10   5. Hypertensive retinopathy of both eyes  H35.033   6. Combined forms of age-related cataract of both eyes  H25.813   7. Moderate nonproliferative diabetic retinopathy of both eyes without macular edema associated with type 2 diabetes mellitus ([place])  D98.3382     1. Vitreous Hemorrhage OS -- improving  - onset of floaters several weeks prior to presentation and significant vision loss noted around 04.06.20 by pt history  - unclear etiology, but suspect related to history of DM and HTN  - s/p IVA OS #1 (04.10.20), #2 (05.14.20), #3 (06.11.20)  - VH clearing, turning white and settling inferiorly  - peripheral exam shows no RT/RD OS  - OCT shows mild persistent DME and ERM OS  - FA (05.14.20) shows vascular perfusion defects and late leaking MA, but no NV OS  - discussed findings and treatment options  - recommend IVA OS #4 (07.09.20) for vitreous hemorrhage and DME  - RBA of procedure discussed, questions answered  - informed consent obtained and signed  - see procedure note  - VH precautions reviewed -- minimize activities, keep head elevated, avoid ASA/NSAIDs/blood thinners as able  - f/u 4 weeks, sooner prn -- DFE/OCT/ possible injection  2,3. Moderate nonproliferative diabetic retinopathy w/ DME, OU  - exam shows scattered DBH OU  - OCT without diabetic macular edema OD; +DME OS  - FA (5.14.20) shows late leaking MA OU, no NV OU  4,5. Hypertensive retinopathy OU  - discussed importance of tight BP control  - discussed possible contribution to [facility] OS  - monitor  6. Mixed form age related cataracts OU  - The symptoms of cataract, surgical options, and treatments and risks were discussed with patient.  - discussed diagnosis and progression  - not yet visually significant  - monitor for now  - at next visit, make referral to general  opth for cataract / glaucoma eval   Ophthalmic Meds Ordered this visit:  No orders of the defined types were placed in this encounter.      Return in about 4 weeks (around 11/23/2018) for VH OS, DFE, OCT.  There are no Patient Instructions on file for this visit.   Explained the diagnoses, plan, and follow up with the patient and they expressed understanding.  Patient expressed understanding of the importance of proper follow up care.   This document serves as a record of services personally performed by Gardiner Sleeper, MD, PhD. It was created on their behalf by Ernest Mallick, OA, an ophthalmic assistant. The creation of this record is the provider's dictation and/or activities during the visit.    Electronically signed by: Ernest Mallick, OA 07.08.2020 1:21 AM    Gardiner Sleeper, M.D., Ph.D. Diseases & Surgery of the Retina and Vitreous [facility]  I have reviewed the above documentation for accuracy and completeness, and I agree with the above. Gardiner Sleeper, M.D., Ph.D. 10/27/18 1:23 AM   Abbreviations: M myopia (nearsighted); A astigmatism; H hyperopia (farsighted); P presbyopia; Mrx spectacle prescription;  CTL contact lenses; OD right eye; OS left eye; OU both eyes  XT exotropia; ET esotropia; PEK punctate epithelial keratitis; PEE punctate epithelial erosions; DES dry eye syndrome; MGD meibomian gland dysfunction; ATs artificial tears; PFAT's preservative free  artificial tears; [place] nuclear sclerotic cataract; PSC posterior subcapsular cataract; ERM epi-retinal membrane; PVD posterior vitreous detachment; RD retinal detachment; DM diabetes mellitus; DR diabetic retinopathy; NPDR non-proliferative diabetic retinopathy; PDR proliferative diabetic retinopathy; CSME clinically significant macular edema; DME diabetic macular edema; dbh dot blot hemorrhages; CWS cotton wool spot; POAG primary open angle glaucoma; C/D cup-to-disc ratio; HVF humphrey visual field; GVF  goldmann visual field; OCT optical coherence tomography; IOP intraocular pressure; BRVO Branch retinal vein occlusion; CRVO central retinal vein occlusion; CRAO central retinal artery occlusion; BRAO branch retinal artery occlusion; RT retinal tear; SB scleral buckle; PPV pars plana vitrectomy; VH Vitreous hemorrhage; PRP panretinal laser photocoagulation; IVK intravitreal kenalog; VMT vitreomacular traction; MH Macular hole;  NVD neovascularization of the disc; NVE neovascularization elsewhere; AREDS age related eye disease study; ARMD age related macular degeneration; POAG primary open angle glaucoma; EBMD epithelial/anterior basement membrane dystrophy; ACIOL anterior chamber intraocular lens; IOL intraocular lens; PCIOL posterior chamber intraocular lens; Phaco/IOL phacoemulsification with intraocular lens placement; [place] photorefractive keratectomy; LASIK laser assisted in situ keratomileusis; HTN hypertension; DM diabetes mellitus; COPD chronic obstructive pulmonary disease

## 2018-10-26 ENCOUNTER — Ambulatory Visit (INDEPENDENT_AMBULATORY_CARE_PROVIDER_SITE_OTHER): Payer: Medicare HMO | Admitting: Ophthalmology

## 2018-10-26 ENCOUNTER — Other Ambulatory Visit: Payer: Self-pay

## 2018-10-26 DIAGNOSIS — H3581 Retinal edema: Secondary | ICD-10-CM | POA: Diagnosis not present

## 2018-10-26 DIAGNOSIS — E113313 Type 2 diabetes mellitus with moderate nonproliferative diabetic retinopathy with macular edema, bilateral: Secondary | ICD-10-CM | POA: Diagnosis not present

## 2018-10-26 DIAGNOSIS — I10 Essential (primary) hypertension: Secondary | ICD-10-CM | POA: Diagnosis not present

## 2018-10-26 DIAGNOSIS — H4312 Vitreous hemorrhage, left eye: Secondary | ICD-10-CM

## 2018-10-26 DIAGNOSIS — H35033 Hypertensive retinopathy, bilateral: Secondary | ICD-10-CM

## 2018-10-26 DIAGNOSIS — H25813 Combined forms of age-related cataract, bilateral: Secondary | ICD-10-CM

## 2018-10-27 ENCOUNTER — Encounter (INDEPENDENT_AMBULATORY_CARE_PROVIDER_SITE_OTHER): Payer: Self-pay | Admitting: Ophthalmology

## 2018-10-27 MED ORDER — BEVACIZUMAB CHEMO INJECTION 1.25MG/0.05ML SYRINGE FOR KALEIDOSCOPE
1.2500 mg | INTRAVITREAL | Status: AC | PRN
  Administered 2018-10-27: 1.25 mg via INTRAVITREAL

## 2018-11-07 ENCOUNTER — Other Ambulatory Visit: Payer: Self-pay

## 2018-11-07 ENCOUNTER — Emergency Department (HOSPITAL_COMMUNITY): Payer: Medicare HMO

## 2018-11-07 ENCOUNTER — Encounter (HOSPITAL_COMMUNITY): Payer: Self-pay

## 2018-11-07 ENCOUNTER — Encounter: Payer: Self-pay | Admitting: Sports Medicine

## 2018-11-07 ENCOUNTER — Ambulatory Visit (INDEPENDENT_AMBULATORY_CARE_PROVIDER_SITE_OTHER): Payer: Medicare HMO | Admitting: Sports Medicine

## 2018-11-07 ENCOUNTER — Inpatient Hospital Stay (HOSPITAL_COMMUNITY)
Admission: EM | Admit: 2018-11-07 | Discharge: 2018-11-09 | DRG: 101 | Disposition: A | Discharge status: Home / Self Care | Payer: Medicare HMO | Attending: Internal Medicine | Admitting: Internal Medicine

## 2018-11-07 VITALS — Temp 97.8°F

## 2018-11-07 DIAGNOSIS — Z8546 Personal history of malignant neoplasm of prostate: Secondary | ICD-10-CM

## 2018-11-07 DIAGNOSIS — I160 Hypertensive urgency: Secondary | ICD-10-CM | POA: Diagnosis present

## 2018-11-07 DIAGNOSIS — Z7984 Long term (current) use of oral hypoglycemic drugs: Secondary | ICD-10-CM

## 2018-11-07 DIAGNOSIS — M79674 Pain in right toe(s): Secondary | ICD-10-CM | POA: Diagnosis not present

## 2018-11-07 DIAGNOSIS — L989 Disorder of the skin and subcutaneous tissue, unspecified: Secondary | ICD-10-CM | POA: Diagnosis not present

## 2018-11-07 DIAGNOSIS — Z1159 Encounter for screening for other viral diseases: Secondary | ICD-10-CM

## 2018-11-07 DIAGNOSIS — R569 Unspecified convulsions: Principal | ICD-10-CM

## 2018-11-07 DIAGNOSIS — I152 Hypertension secondary to endocrine disorders: Secondary | ICD-10-CM

## 2018-11-07 DIAGNOSIS — E118 Type 2 diabetes mellitus with unspecified complications: Secondary | ICD-10-CM

## 2018-11-07 DIAGNOSIS — L84 Corns and callosities: Secondary | ICD-10-CM

## 2018-11-07 DIAGNOSIS — F22 Delusional disorders: Secondary | ICD-10-CM | POA: Diagnosis present

## 2018-11-07 DIAGNOSIS — F419 Anxiety disorder, unspecified: Secondary | ICD-10-CM | POA: Diagnosis present

## 2018-11-07 DIAGNOSIS — E1140 Type 2 diabetes mellitus with diabetic neuropathy, unspecified: Secondary | ICD-10-CM

## 2018-11-07 DIAGNOSIS — R000 Tachycardia, unspecified: Secondary | ICD-10-CM

## 2018-11-07 DIAGNOSIS — B351 Tinea unguium: Secondary | ICD-10-CM

## 2018-11-07 DIAGNOSIS — F10239 Alcohol dependence with withdrawal, unspecified: Secondary | ICD-10-CM

## 2018-11-07 DIAGNOSIS — Z880 Allergy status to penicillin: Secondary | ICD-10-CM

## 2018-11-07 DIAGNOSIS — Z79899 Other long term (current) drug therapy: Secondary | ICD-10-CM

## 2018-11-07 DIAGNOSIS — R55 Syncope and collapse: Secondary | ICD-10-CM

## 2018-11-07 DIAGNOSIS — I10 Essential (primary) hypertension: Secondary | ICD-10-CM | POA: Diagnosis present

## 2018-11-07 DIAGNOSIS — M79675 Pain in left toe(s): Secondary | ICD-10-CM

## 2018-11-07 DIAGNOSIS — G40909 Epilepsy, unspecified, not intractable, without status epilepticus: Secondary | ICD-10-CM

## 2018-11-07 DIAGNOSIS — R911 Solitary pulmonary nodule: Secondary | ICD-10-CM

## 2018-11-07 DIAGNOSIS — E1159 Type 2 diabetes mellitus with other circulatory complications: Secondary | ICD-10-CM

## 2018-11-07 DIAGNOSIS — R4189 Other symptoms and signs involving cognitive functions and awareness: Secondary | ICD-10-CM | POA: Diagnosis present

## 2018-11-07 DIAGNOSIS — Z716 Tobacco abuse counseling: Secondary | ICD-10-CM

## 2018-11-07 DIAGNOSIS — F17200 Nicotine dependence, unspecified, uncomplicated: Secondary | ICD-10-CM | POA: Diagnosis present

## 2018-11-07 DIAGNOSIS — E876 Hypokalemia: Secondary | ICD-10-CM | POA: Diagnosis present

## 2018-11-07 DIAGNOSIS — E119 Type 2 diabetes mellitus without complications: Secondary | ICD-10-CM

## 2018-11-07 HISTORY — DX: Malignant (primary) neoplasm, unspecified: C80.1

## 2018-11-07 LAB — COMPREHENSIVE METABOLIC PANEL
ALT: 50 U/L — ABNORMAL HIGH (ref 0–44)
AST: 43 U/L — ABNORMAL HIGH (ref 15–41)
Albumin: 3 g/dL — ABNORMAL LOW (ref 3.5–5.0)
Alkaline Phosphatase: 64 U/L (ref 38–126)
Anion gap: 11 (ref 5–15)
BUN: 18 mg/dL (ref 8–23)
CO2: 22 mmol/L (ref 22–32)
Calcium: 9.3 mg/dL (ref 8.9–10.3)
Chloride: 104 mmol/L (ref 98–111)
Creatinine, Ser: 1.19 mg/dL (ref 0.61–1.24)
GFR calc Af Amer: >60 mL/min (ref >60)
GFR calc non Af Amer: >60 mL/min (ref >60)
Glucose, Bld: 358 mg/dL — ABNORMAL HIGH (ref 70–99)
Potassium: 3 mmol/L — ABNORMAL LOW (ref 3.5–5.1)
Sodium: 137 mmol/L (ref 135–145)
Total Bilirubin: 0.3 mg/dL (ref 0.3–1.2)
Total Protein: 7.6 g/dL (ref 6.5–8.1)

## 2018-11-07 LAB — CBC WITH DIFFERENTIAL/PLATELET
Abs Immature Granulocytes: 0.03 10*3/uL (ref 0.00–0.07)
Basophils Absolute: 0 10*3/uL (ref 0.0–0.1)
Basophils Relative: 0 %
Eosinophils Absolute: 0.1 10*3/uL (ref 0.0–0.5)
Eosinophils Relative: 2 %
HCT: 41.1 % (ref 39.0–52.0)
Hemoglobin: 13.9 g/dL (ref 13.0–17.0)
Immature Granulocytes: 1 %
Lymphocytes Relative: 55 %
Lymphs Abs: 2.8 10*3/uL (ref 0.7–4.0)
MCH: 31.3 pg (ref 26.0–34.0)
MCHC: 33.8 g/dL (ref 30.0–36.0)
MCV: 92.6 fL (ref 80.0–100.0)
Monocytes Absolute: 0.5 10*3/uL (ref 0.1–1.0)
Monocytes Relative: 10 %
Neutro Abs: 1.6 10*3/uL — ABNORMAL LOW (ref 1.7–7.7)
Neutrophils Relative %: 32 %
Platelets: 187 10*3/uL (ref 150–400)
RBC: 4.44 MIL/uL (ref 4.22–5.81)
RDW: 11.7 % (ref 11.5–15.5)
WBC: 5 10*3/uL (ref 4.0–10.5)
nRBC: 0 % (ref 0.0–0.2)

## 2018-11-07 LAB — ETHANOL: Alcohol, Ethyl (B): <10 mg/dL (ref <10)

## 2018-11-07 LAB — PROTIME-INR
INR: 0.9 (ref 0.8–1.2)
Prothrombin Time: 12.2 seconds (ref 11.4–15.2)

## 2018-11-07 LAB — LACTIC ACID, PLASMA: Lactic Acid, Venous: 2.4 mmol/L (ref 0.5–1.9)

## 2018-11-07 MED ORDER — SODIUM CHLORIDE 0.9 % IV BOLUS
1000.0000 mL | Freq: Once | INTRAVENOUS | Status: AC
  Administered 2018-11-08: 1000 mL via INTRAVENOUS

## 2018-11-07 NOTE — Progress Notes (Signed)
Subjective: Gregory Nunez is a 73 y.o. male patient with history of diabetes who presents to office today complaining of painful callus and nails while ambulating in shoes; unable to trim. His glucose reading this morning was not recorded,"Does not check"; last A1c 5.6.  Admits that he wants to discuss other options for his painful corn and callus especially at the second toe on the right foot.  No other issues noted.  Patient Active Problem List   Diagnosis Date Noted  . Symptomatic anemia 04/15/2018  . Prostate cancer ([place]) 02/27/2014  . Malignant neoplasm of prostate ([place]) 01/09/2014  . Bunion 01/17/2013  . 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 amLODipine (NORVASC) 5 MG tablet Take 5 mg by mouth daily.    . ferrous sulfate 325 (65 FE) MG tablet Take 1 tablet (325 mg total) by mouth 2 (two) times daily with a meal. (Patient taking differently: Take 325 mg by mouth daily. ) 30 tablet 3  . gabapentin (NEURONTIN) 300 MG capsule Take 300 mg by mouth 3 (three) times daily.    Marland Kitchen glimepiride (AMARYL) 4 MG tablet Take 4 mg by mouth every evening.     . Ipratropium-Albuterol (COMBIVENT RESPIMAT) 20-100 MCG/ACT AERS respimat Inhale 1 puff into the lungs every 6 (six) hours as needed for wheezing or shortness of breath. 4 g 1  . losartan (COZAAR) 100 MG tablet Take 100 mg by mouth every evening.     Marland Kitchen losartan (COZAAR) 50 MG tablet     . mometasone-formoterol (DULERA) 100-5 MCG/ACT AERO Inhale 2 puffs into the lungs daily. 13 g 1  . pantoprazole (PROTONIX) 40 MG tablet Take 1 tablet (40 mg total) by mouth daily. 30 tablet 1  . sucralfate (CARAFATE) 1 g tablet Take 1 tablet (1 g total) by mouth 4 (four) times daily. 120 tablet 1  . tiotropium (SPIRIVA HANDIHALER) 18 MCG inhalation capsule Place 1 capsule (18 mcg total) into inhaler and inhale daily. 30 capsule 2  . traZODone (DESYREL) 50 MG tablet Take 50 mg  by mouth at bedtime as needed for sleep.     No current facility-administered medications on file prior to visit.    Allergies  Allergen Reactions  . Penicillins Anxiety and Other (See Comments)    DID THE REACTION INVOLVE: Swelling of the face/tongue/throat, SOB, or low BP? No Sudden or severe rash/hives, skin peeling, or the inside of the mouth or nose? No Did it require medical treatment? No When did it last happen?many years ago If all above answers are "NO", may proceed with cephalosporin use.       No results found for this or any previous visit (from the past 2160 hour(s)).  Objective: General: Patient is awake, alert, and oriented x 3 and in no acute distress.  Integument: Skin is warm, dry and supple bilateral. Nails are mildly elongated and mildly, thickened and dystrophic with subungual debris, consistent with onychomycosis, 1-5 bilateral. No signs of infection. Callus sub-met 5 bilateral and medial second toe on right and lateral first toe on the right with no signs of infection. Remaining integument unremarkable.  Vasculature:  Dorsalis Pedis pulse 2/4 bilateral. Posterior Tibial pulse  2/4 bilateral.  Capillary fill time <3 sec 1-5 bilateral. Positive hair growth to the level of the digits. Temperature gradient within normal limits. No varicosities present bilateral. No edema present bilateral.   Neurology: The patient has diminished sensation measured  with a 5.07/10g Semmes Weinstein Monofilament at all pedal sites bilateral. Vibratory sensation diminished bilateral with tuning fork. No Babinski sign present bilateral.   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 Muscular strength 5/5 in all lower extremity muscular groups bilateral without pain on range of motion . No tenderness with calf compression bilateral.  Assessment and Plan: Problem List Items Addressed This Visit    None    Visit Diagnoses    Corns and callosities    -   Primary   Pain due to onychomycosis of toenails of both feet       Benign skin lesion       Type 2 diabetes, controlled, with neuropathy (HCC)       Relevant Medications   losartan (COZAAR) 50 MG tablet     -Examined patient. -Discussed and educated patient on diabetic foot care, especially with  regards to the vascular, neurological and musculoskeletal systems. -Mechanically debrided nails x10 using a sterile nail nipper without incident and debrided callus 3 using sterile chisel blade without incident  -Continue with toe spacer for right foot as needed; new spacer given at this visit -Continue with diabetic shoes/insoles as previously recommended -Advised patient in order to correct the hammertoe would also require bunion surgery as well which can take a 35-monthrecovery patient at this time declines wanting to proceed with something that aggressive for his bunion and hammertoe reports that he will continue to live with it and to use his cushions and spacers -Patient to return  in 2-2.5 months for at risk foot care -Patient advised to call the office if any problems or questions arise in the meantime.  TLandis Martins DPM
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
## 2018-11-07 NOTE — ED Notes (Signed)
Dr. Maryan Rued notified of Lactic Acid 2.4

## 2018-11-07 NOTE — ED Notes (Signed)
Pt states that he sniffed some heroin today, oxygen dropped to 87% on RA, placed on 2L

## 2018-11-07 NOTE — ED Provider Notes (Signed)
Plan at signout is to f/u on labs/ekg and reassess after IV fluids Pt stable at this time    Ripley Fraise, MD 11/07/18 2347
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
## 2018-11-08 ENCOUNTER — Observation Stay (HOSPITAL_COMMUNITY): Payer: Medicare HMO

## 2018-11-08 ENCOUNTER — Emergency Department (HOSPITAL_COMMUNITY): Payer: Medicare HMO

## 2018-11-08 ENCOUNTER — Other Ambulatory Visit: Payer: Self-pay

## 2018-11-08 DIAGNOSIS — R569 Unspecified convulsions: Secondary | ICD-10-CM

## 2018-11-08 DIAGNOSIS — I10 Essential (primary) hypertension: Secondary | ICD-10-CM

## 2018-11-08 DIAGNOSIS — G40909 Epilepsy, unspecified, not intractable, without status epilepticus: Secondary | ICD-10-CM

## 2018-11-08 DIAGNOSIS — F10239 Alcohol dependence with withdrawal, unspecified: Secondary | ICD-10-CM

## 2018-11-08 DIAGNOSIS — E1159 Type 2 diabetes mellitus with other circulatory complications: Secondary | ICD-10-CM

## 2018-11-08 DIAGNOSIS — R4189 Other symptoms and signs involving cognitive functions and awareness: Secondary | ICD-10-CM | POA: Diagnosis present

## 2018-11-08 DIAGNOSIS — I152 Hypertension secondary to endocrine disorders: Secondary | ICD-10-CM

## 2018-11-08 DIAGNOSIS — E118 Type 2 diabetes mellitus with unspecified complications: Secondary | ICD-10-CM

## 2018-11-08 DIAGNOSIS — R55 Syncope and collapse: Secondary | ICD-10-CM | POA: Diagnosis not present

## 2018-11-08 DIAGNOSIS — E119 Type 2 diabetes mellitus without complications: Secondary | ICD-10-CM

## 2018-11-08 DIAGNOSIS — F10939 Alcohol use, unspecified with withdrawal, unspecified: Secondary | ICD-10-CM

## 2018-11-08 HISTORY — DX: Unspecified convulsions: R56.9

## 2018-11-08 LAB — GLUCOSE, CAPILLARY
Glucose-Capillary: 230 mg/dL — ABNORMAL HIGH (ref 70–99)
Glucose-Capillary: 228 mg/dL — ABNORMAL HIGH (ref 70–99)
Glucose-Capillary: 148 mg/dL — ABNORMAL HIGH (ref 70–99)
Glucose-Capillary: 129 mg/dL — ABNORMAL HIGH (ref 70–99)

## 2018-11-08 LAB — LACTIC ACID, PLASMA: Lactic Acid, Venous: 1.5 mmol/L (ref 0.5–1.9)

## 2018-11-08 LAB — RAPID URINE DRUG SCREEN, HOSP PERFORMED
Amphetamines: NOT DETECTED
Barbiturates: NOT DETECTED
Benzodiazepines: NOT DETECTED
Cocaine: NOT DETECTED
Opiates: NOT DETECTED
Tetrahydrocannabinol: NOT DETECTED

## 2018-11-08 LAB — CBC
HCT: 38.8 % — ABNORMAL LOW (ref 39.0–52.0)
Hemoglobin: 13.3 g/dL (ref 13.0–17.0)
MCH: 31.3 pg (ref 26.0–34.0)
MCHC: 34.3 g/dL (ref 30.0–36.0)
MCV: 91.3 fL (ref 80.0–100.0)
Platelets: 174 10*3/uL (ref 150–400)
RBC: 4.25 MIL/uL (ref 4.22–5.81)
RDW: 11.8 % (ref 11.5–15.5)
WBC: 7.2 10*3/uL (ref 4.0–10.5)
nRBC: 0 % (ref 0.0–0.2)

## 2018-11-08 LAB — CREATININE, SERUM
Creatinine, Ser: 1.05 mg/dL (ref 0.61–1.24)
GFR calc Af Amer: >60 mL/min (ref >60)
GFR calc non Af Amer: >60 mL/min (ref >60)

## 2018-11-08 LAB — SARS CORONAVIRUS 2 BY RT PCR (HOSPITAL ORDER, PERFORMED IN ~~LOC~~ HOSPITAL LAB): SARS Coronavirus 2: NEGATIVE

## 2018-11-08 LAB — CBG MONITORING, ED: Glucose-Capillary: 297 mg/dL — ABNORMAL HIGH (ref 70–99)

## 2018-11-08 MED ORDER — INSULIN ASPART 100 UNIT/ML ~~LOC~~ SOLN: 0.0000 [IU] | Freq: Every day | SUBCUTANEOUS | Status: DC

## 2018-11-08 MED ORDER — LORAZEPAM 2 MG/ML IJ SOLN
1.0000 mg | Freq: Four times a day (QID) | INTRAMUSCULAR | Status: DC | PRN
  Administered 2018-11-08: 1 mg via INTRAVENOUS
  Filled 2018-11-08: qty 1

## 2018-11-08 MED ORDER — ACETAMINOPHEN 325 MG PO TABS: 650.0000 mg | ORAL_TABLET | Freq: Four times a day (QID) | ORAL | Status: DC | PRN

## 2018-11-08 MED ORDER — INSULIN ASPART 100 UNIT/ML IV SOLN
4.0000 [IU] | Freq: Once | INTRAVENOUS | Status: AC
  Administered 2018-11-08: 4 [IU] via INTRAVENOUS

## 2018-11-08 MED ORDER — INSULIN ASPART 100 UNIT/ML ~~LOC~~ SOLN
0.0000 [IU] | Freq: Three times a day (TID) | SUBCUTANEOUS | Status: DC
  Administered 2018-11-08: 17:00:00 1 [IU] via SUBCUTANEOUS
  Administered 2018-11-08: 3 [IU] via SUBCUTANEOUS
  Administered 2018-11-09: 12:00:00 1 [IU] via SUBCUTANEOUS

## 2018-11-08 MED ORDER — ADULT MULTIVITAMIN W/MINERALS CH
1.0000 | ORAL_TABLET | Freq: Every day | ORAL | Status: DC
  Administered 2018-11-08 – 2018-11-09 (×2): 1 via ORAL
  Filled 2018-11-08 (×2): qty 1

## 2018-11-08 MED ORDER — VITAMIN B-1 100 MG PO TABS
100.0000 mg | ORAL_TABLET | Freq: Every day | ORAL | Status: DC
  Administered 2018-11-09: 100 mg via ORAL
  Filled 2018-11-08 (×2): qty 1

## 2018-11-08 MED ORDER — HYDRALAZINE HCL 20 MG/ML IJ SOLN
5.0000 mg | INTRAMUSCULAR | Status: DC | PRN
  Administered 2018-11-08: 5 mg via INTRAVENOUS
  Filled 2018-11-08: qty 1

## 2018-11-08 MED ORDER — HYDRALAZINE HCL 20 MG/ML IJ SOLN
10.0000 mg | INTRAMUSCULAR | Status: DC | PRN
  Filled 2018-11-08: qty 1

## 2018-11-08 MED ORDER — IOHEXOL 350 MG/ML SOLN
75.0000 mL | Freq: Once | INTRAVENOUS | Status: AC | PRN
  Administered 2018-11-08: 75 mL via INTRAVENOUS

## 2018-11-08 MED ORDER — FOLIC ACID 1 MG PO TABS
1.0000 mg | ORAL_TABLET | Freq: Every day | ORAL | Status: DC
  Administered 2018-11-08 – 2018-11-09 (×2): 1 mg via ORAL
  Filled 2018-11-08 (×2): qty 1

## 2018-11-08 MED ORDER — ONDANSETRON HCL 4 MG PO TABS: 4.0000 mg | ORAL_TABLET | Freq: Four times a day (QID) | ORAL | Status: DC | PRN

## 2018-11-08 MED ORDER — LORAZEPAM 2 MG/ML IJ SOLN
1.0000 mg | Freq: Once | INTRAMUSCULAR | Status: AC
  Administered 2018-11-08: 1 mg via INTRAVENOUS
  Filled 2018-11-08: qty 1

## 2018-11-08 MED ORDER — LORAZEPAM 2 MG/ML IJ SOLN
1.0000 mg | INTRAMUSCULAR | Status: DC
  Administered 2018-11-08 (×2): 1 mg via INTRAVENOUS
  Filled 2018-11-08 (×2): qty 1

## 2018-11-08 MED ORDER — ENOXAPARIN SODIUM 40 MG/0.4ML ~~LOC~~ SOLN
40.0000 mg | Freq: Every day | SUBCUTANEOUS | Status: DC
  Administered 2018-11-08 – 2018-11-09 (×2): 40 mg via SUBCUTANEOUS
  Filled 2018-11-08 (×2): qty 0.4

## 2018-11-08 MED ORDER — SODIUM CHLORIDE 0.9 % IV BOLUS (SEPSIS)
1000.0000 mL | Freq: Once | INTRAVENOUS | Status: AC
  Administered 2018-11-08: 1000 mL via INTRAVENOUS

## 2018-11-08 MED ORDER — ACETAMINOPHEN 650 MG RE SUPP: 650.0000 mg | Freq: Four times a day (QID) | RECTAL | Status: DC | PRN

## 2018-11-08 MED ORDER — LACTATED RINGERS IV SOLN
INTRAVENOUS | Status: DC
  Administered 2018-11-08: 17:00:00 via INTRAVENOUS

## 2018-11-08 MED ORDER — AMLODIPINE BESYLATE 10 MG PO TABS
10.0000 mg | ORAL_TABLET | Freq: Every day | ORAL | Status: DC
  Administered 2018-11-08 – 2018-11-09 (×2): 10 mg via ORAL
  Filled 2018-11-08 (×2): qty 1

## 2018-11-08 MED ORDER — ONDANSETRON HCL 4 MG/2ML IJ SOLN: 4.0000 mg | Freq: Four times a day (QID) | INTRAMUSCULAR | Status: DC | PRN

## 2018-11-08 MED ORDER — POTASSIUM CHLORIDE CRYS ER 20 MEQ PO TBCR: 40.0000 meq | EXTENDED_RELEASE_TABLET | Freq: Once | ORAL | Status: DC

## 2018-11-08 MED ORDER — THIAMINE HCL 100 MG/ML IJ SOLN
100.0000 mg | Freq: Every day | INTRAMUSCULAR | Status: DC
  Administered 2018-11-08: 100 mg via INTRAVENOUS
  Filled 2018-11-08: qty 2

## 2018-11-08 MED ORDER — AMLODIPINE BESYLATE 5 MG PO TABS: 5.0000 mg | ORAL_TABLET | Freq: Every day | ORAL | Status: DC

## 2018-11-08 MED ORDER — LORAZEPAM 2 MG/ML IJ SOLN
1.0000 mg | Freq: Four times a day (QID) | INTRAMUSCULAR | Status: DC
  Administered 2018-11-09 (×2): 1 mg via INTRAVENOUS
  Filled 2018-11-08 (×2): qty 1

## 2018-11-08 MED ORDER — LORAZEPAM 2 MG/ML IJ SOLN: 1.0000 mg | INTRAMUSCULAR | Status: DC | PRN

## 2018-11-08 MED ORDER — POTASSIUM CHLORIDE CRYS ER 20 MEQ PO TBCR
40.0000 meq | EXTENDED_RELEASE_TABLET | Freq: Once | ORAL | Status: AC
  Administered 2018-11-08: 40 meq via ORAL
  Filled 2018-11-08: qty 2

## 2018-11-08 MED ORDER — INSULIN ASPART 100 UNIT/ML IV SOLN: 5.0000 [IU] | Freq: Once | INTRAVENOUS | Status: DC

## 2018-11-08 MED ORDER — LORAZEPAM 1 MG PO TABS: 1.0000 mg | ORAL_TABLET | Freq: Four times a day (QID) | ORAL | Status: DC | PRN

## 2018-11-08 MED ORDER — GABAPENTIN 300 MG PO CAPS
300.0000 mg | ORAL_CAPSULE | Freq: Three times a day (TID) | ORAL | Status: DC
  Administered 2018-11-08 – 2018-11-09 (×4): 300 mg via ORAL
  Filled 2018-11-08 (×4): qty 1
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
## 2018-11-08 NOTE — ED Provider Notes (Signed)
I reassessed patient prior to admission.  He is tachycardic and hypertensive, and appears tremulous. On further discussion, he admits to drinking alcohol, but reports he has had no more than 6 drinks in the past year. He had initially denied drug abuse, but told nursing that he sniffed heroin yesterday. He is now requesting discharge because he reports he has to "pay money to some people", but reports he will come right back. When I told him that he would have to sign out AMA and check back in the ER, he decided to hold off on leaving  Strong suspicion that this represents substance withdrawal of unclear etiology.  Although he denies alcohol abuse, his clinical picture is now looking more like alcohol withdrawal. He did admit to heroin use to nursing.  I did advise him of pulmonary nodule and need for repeat CT scan.   Ripley Fraise, MD 11/08/18 [phone]

## 2018-11-08 NOTE — Plan of Care (Signed)
  Problem: Clinical Measurements: Goal: Ability to maintain clinical measurements within normal limits will improve Outcome: Progressing Goal: Will remain free from infection Outcome: Progressing Goal: Diagnostic test results will improve Outcome: Progressing Goal: Respiratory complications will improve Outcome: Progressing Goal: Cardiovascular complication will be avoided Outcome: Progressing   Problem: Activity: Goal: Risk for activity intolerance will decrease Outcome: Progressing   Problem: Nutrition: Goal: Adequate nutrition will be maintained Outcome: Progressing   Problem: Pain Managment: Goal: General experience of comfort will improve Outcome: Progressing   Problem: Safety: Goal: Ability to remain free from injury will improve Outcome: Progressing   

## 2018-11-08 NOTE — Progress Notes (Signed)
EEG complete - results pending 

## 2018-11-08 NOTE — ED Notes (Signed)
Pt reports that he does not drink but about 3 drinks a month, pt states that he is always a very nervous person and that is why he is so shaky.

## 2018-11-08 NOTE — ED Notes (Signed)
Transported to CT 
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
## 2018-11-08 NOTE — Procedures (Signed)
Patient Name: Gregory Nunez.  MRN: [id number]  Epilepsy Attending: Lora Havens  Referring Physician/Provider: Dr Jimmy Picket Arrien Date: 11/08/18  Duration: 25.55 mins  Patient history:   Level of alertness:   AEDs during EEG study:   Technical aspects: This EEG study was done with scalp electrodes positioned according to the 10-20 International system of electrode placement. Electrical activity was acquired at a sampling rate of 500Hz  and reviewed with a high frequency filter of 70Hz  and a low frequency filter of 1Hz . EEG data were recorded continuously and digitally stored.   BACKGROUND ACTIVITY: Posterior dominant rhythm: The posterior dominant rhythm consists of 9-10 Hz activity of moderate voltage (25-35 uV) seen predominantly in posterior head regions, symmetric and reactive to eye opening and eye closing.          Slowing: None  EPILEPTIFORM ACTIVITY: Interictal epileptiform activity: None  Ictal Activity: None  OTHER EVENTS: None  ACTIVATION PROCEDURES:  Hyperventilation and photic stimulation were not performed.  IMPRESSION: This study during awake state only is within normal limits. No seizures or epileptiform discharges were seen throughout the recording.  Zyon Rosser Barbra Sarks

## 2018-11-08 NOTE — ED Provider Notes (Signed)
ED ECG REPORT   Date: 11/08/2018 7034  Rate: 110  Rhythm: sinus tachycardia  QRS Axis: left  Intervals: normal  ST/T Wave abnormalities: nonspecific ST changes  Conduction Disutrbances:nonspecific intraventricular conduction delay  Narrative Interpretation:   Old EKG Reviewed: unchanged from 04/15/18  I have personally reviewed the EKG tracing and agree with the computerized printout as noted.    Ripley Fraise, MD 11/08/18 [phone]
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
## 2018-11-08 NOTE — ED Provider Notes (Signed)
No acute findings on CT chest.  Patient still tachycardic. He was found to have a lung nodule that will need follow-up CT imaging. COVID-19 screen is negative. Patient will be admitted for syncope versus seizure episode.  Discussed with Dr. Blaine Hamper for admission   Ripley Fraise, MD 11/08/18 [phone]

## 2018-11-08 NOTE — ED Provider Notes (Signed)
Urine drug screen is negative. Patient is still tachycardic at rest, and has an oxygen requirement.  He has episodes of hypoxia when I am talking to him in the room.  He has abnormal lung sounds.  X-ray was negative.  We will proceed with CT chest, COVID-19 screen, and will need to be admitted.  Unclear etiology of his episode earlier in the night.  Gregory Nunez was evaluated in Emergency Department on 11/08/2018 for the symptoms described in the history of present illness. He was evaluated in the context of the global COVID-19 pandemic, which necessitated consideration that the patient might be at risk for infection with the SARS-CoV-2 virus that causes COVID-19. Institutional protocols and algorithms that pertain to the evaluation of patients at risk for COVID-19 are in a state of rapid change based on information released by regulatory bodies including the CDC and federal and state organizations. These policies and algorithms were followed during the patient's care in the ED.    Ripley Fraise, MD 11/08/18 [phone]
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
## 2018-11-08 NOTE — Progress Notes (Signed)
Rally Ouch Eliyah Bazzi. is a 73 y.o. male patient admitted from ED unresponsive, now alert  X 4 - no acute distress noted.  VSS - Blood pressure (!) 160/91, pulse (!) 108, temperature 99 F (37.2 C), temperature source Oral, resp. rate 16, height 5\' 11"  (1.803 m), weight 81.2 kg, SpO2 100 %.    IV in place, occlusive dsg intact without redness.  Orientation to room, and floor completed with information packet given to patient/family.  Patient declined safety video at this time.  Admission INP armband ID verified with patient/family, and in place.   SR up x 2, fall assessment complete, with patient and family able to verbalize understanding of risk associated with falls, and verbalized understanding to call nsg before up out of bed.  Call light within reach, patient able to voice, and demonstrate understanding.  Skin, clean-dry- intact without evidence of bruising, or skin tears.   No evidence of skin break down noted on exam.     Will cont to eval and treat per MD orders.  [place], RN 11/08/2018 8:47 AM

## 2018-11-08 NOTE — ED Notes (Signed)
Pt vomiting.

## 2018-11-09 DIAGNOSIS — Z7984 Long term (current) use of oral hypoglycemic drugs: Secondary | ICD-10-CM | POA: Diagnosis not present

## 2018-11-09 DIAGNOSIS — F17200 Nicotine dependence, unspecified, uncomplicated: Secondary | ICD-10-CM | POA: Diagnosis present

## 2018-11-09 DIAGNOSIS — R000 Tachycardia, unspecified: Secondary | ICD-10-CM | POA: Diagnosis present

## 2018-11-09 DIAGNOSIS — Z716 Tobacco abuse counseling: Secondary | ICD-10-CM | POA: Diagnosis not present

## 2018-11-09 DIAGNOSIS — F22 Delusional disorders: Secondary | ICD-10-CM | POA: Diagnosis present

## 2018-11-09 DIAGNOSIS — R55 Syncope and collapse: Secondary | ICD-10-CM | POA: Diagnosis not present

## 2018-11-09 DIAGNOSIS — Z880 Allergy status to penicillin: Secondary | ICD-10-CM | POA: Diagnosis not present

## 2018-11-09 DIAGNOSIS — I10 Essential (primary) hypertension: Secondary | ICD-10-CM | POA: Diagnosis present

## 2018-11-09 DIAGNOSIS — I160 Hypertensive urgency: Secondary | ICD-10-CM | POA: Diagnosis present

## 2018-11-09 DIAGNOSIS — Z1159 Encounter for screening for other viral diseases: Secondary | ICD-10-CM | POA: Diagnosis not present

## 2018-11-09 DIAGNOSIS — E876 Hypokalemia: Secondary | ICD-10-CM | POA: Diagnosis present

## 2018-11-09 DIAGNOSIS — Z8546 Personal history of malignant neoplasm of prostate: Secondary | ICD-10-CM | POA: Diagnosis not present

## 2018-11-09 DIAGNOSIS — Z79899 Other long term (current) drug therapy: Secondary | ICD-10-CM | POA: Diagnosis not present

## 2018-11-09 DIAGNOSIS — F10239 Alcohol dependence with withdrawal, unspecified: Secondary | ICD-10-CM | POA: Diagnosis not present

## 2018-11-09 DIAGNOSIS — R569 Unspecified convulsions: Secondary | ICD-10-CM | POA: Diagnosis present

## 2018-11-09 DIAGNOSIS — F419 Anxiety disorder, unspecified: Secondary | ICD-10-CM | POA: Diagnosis present

## 2018-11-09 DIAGNOSIS — E119 Type 2 diabetes mellitus without complications: Secondary | ICD-10-CM | POA: Diagnosis present

## 2018-11-09 LAB — BASIC METABOLIC PANEL
Anion gap: 9 (ref 5–15)
BUN: 14 mg/dL (ref 8–23)
CO2: 26 mmol/L (ref 22–32)
Calcium: 9.1 mg/dL (ref 8.9–10.3)
Chloride: 106 mmol/L (ref 98–111)
Creatinine, Ser: 1.11 mg/dL (ref 0.61–1.24)
Glucose, Bld: 134 mg/dL — ABNORMAL HIGH (ref 70–99)
Potassium: 3.7 mmol/L (ref 3.5–5.1)
Sodium: 141 mmol/L (ref 135–145)

## 2018-11-09 LAB — CBC
HCT: 38.6 % — ABNORMAL LOW (ref 39.0–52.0)
Hemoglobin: 12.7 g/dL — ABNORMAL LOW (ref 13.0–17.0)
MCH: 30.9 pg (ref 26.0–34.0)
MCHC: 32.9 g/dL (ref 30.0–36.0)
MCV: 93.9 fL (ref 80.0–100.0)
Platelets: 160 10*3/uL (ref 150–400)
RBC: 4.11 MIL/uL — ABNORMAL LOW (ref 4.22–5.81)
RDW: 11.9 % (ref 11.5–15.5)
WBC: 5.2 10*3/uL (ref 4.0–10.5)
nRBC: 0 % (ref 0.0–0.2)

## 2018-11-09 LAB — MAGNESIUM: Magnesium: 1.5 mg/dL — ABNORMAL LOW (ref 1.7–2.4)

## 2018-11-09 LAB — GLUCOSE, CAPILLARY
Glucose-Capillary: 91 mg/dL (ref 70–99)
Glucose-Capillary: 137 mg/dL — ABNORMAL HIGH (ref 70–99)

## 2018-11-09 MED ORDER — MAGNESIUM SULFATE 2 GM/50ML IV SOLN
2.0000 g | Freq: Once | INTRAVENOUS | Status: AC
  Administered 2018-11-09: 2 g via INTRAVENOUS
  Filled 2018-11-09: qty 50

## 2018-11-09 MED ORDER — THIAMINE HCL 100 MG PO TABS: 100.0000 mg | ORAL_TABLET | Freq: Every day | ORAL | Status: DC

## 2018-11-09 MED ORDER — FOLIC ACID 1 MG PO TABS: 1.0000 mg | ORAL_TABLET | Freq: Every day | ORAL | Status: DC

## 2018-11-09 MED ORDER — AMLODIPINE BESYLATE 10 MG PO TABS: 10.0000 mg | ORAL_TABLET | Freq: Every day | ORAL | 1 refills | Status: DC

## 2018-11-09 MED ORDER — VITAMIN B-1 100 MG PO TABS: 500.0000 mg | ORAL_TABLET | Freq: Three times a day (TID) | ORAL | Status: DC

## 2018-11-09 MED ORDER — ADULT MULTIVITAMIN W/MINERALS CH: 1.0000 | ORAL_TABLET | Freq: Every day | ORAL | Status: DC

## 2018-11-09 MED ORDER — VITAMIN B-1 100 MG PO TABS: 100.0000 mg | ORAL_TABLET | Freq: Every day | ORAL | Status: DC
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
## 2018-11-09 NOTE — Progress Notes (Signed)
Pt's BP assessed, and noted to be 187/100 (123) . RN attempted to administer Hydralazine as ordered, but pt refused. Pt educated on the benefits of medication, but stated, " You all are not going to keep giving me all these medication. I am ready to go home. I'm not taking nothing else." MD made aware. RN to continue to monitor.
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
## 2018-11-09 NOTE — Progress Notes (Signed)
Gold chain found in pink denture cup, and returned to patient. Charge RN aware.
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
## 2018-11-09 NOTE — Progress Notes (Signed)
Pt discharged with family. Pt a/o, VSS, showing no signs of distress. MD aware of discharge. All belongings sent home with patient. RN to continue to monitor.

## 2018-11-10 ENCOUNTER — Encounter: Payer: Self-pay | Admitting: Sports Medicine

## 2018-11-23 ENCOUNTER — Encounter (INDEPENDENT_AMBULATORY_CARE_PROVIDER_SITE_OTHER): Payer: Medicare HMO | Admitting: Ophthalmology
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
## 2018-11-27 ENCOUNTER — Ambulatory Visit (INDEPENDENT_AMBULATORY_CARE_PROVIDER_SITE_OTHER): Payer: Medicare HMO | Admitting: Ophthalmology

## 2018-11-27 ENCOUNTER — Encounter (INDEPENDENT_AMBULATORY_CARE_PROVIDER_SITE_OTHER): Payer: Self-pay | Admitting: Ophthalmology

## 2018-11-27 ENCOUNTER — Other Ambulatory Visit: Payer: Self-pay

## 2018-11-27 DIAGNOSIS — I10 Essential (primary) hypertension: Secondary | ICD-10-CM

## 2018-11-27 DIAGNOSIS — H35033 Hypertensive retinopathy, bilateral: Secondary | ICD-10-CM

## 2018-11-27 DIAGNOSIS — E113313 Type 2 diabetes mellitus with moderate nonproliferative diabetic retinopathy with macular edema, bilateral: Secondary | ICD-10-CM | POA: Diagnosis not present

## 2018-11-27 DIAGNOSIS — H40003 Preglaucoma, unspecified, bilateral: Secondary | ICD-10-CM

## 2018-11-27 DIAGNOSIS — H4312 Vitreous hemorrhage, left eye: Secondary | ICD-10-CM

## 2018-11-27 DIAGNOSIS — H25813 Combined forms of age-related cataract, bilateral: Secondary | ICD-10-CM

## 2018-11-27 DIAGNOSIS — H3581 Retinal edema: Secondary | ICD-10-CM | POA: Diagnosis not present

## 2018-11-27 DIAGNOSIS — H40053 Ocular hypertension, bilateral: Secondary | ICD-10-CM

## 2018-11-27 MED ORDER — BRIMONIDINE TARTRATE 0.2 % OP SOLN: 1.0000 [drp] | Freq: Two times a day (BID) | OPHTHALMIC | 3 refills | Status: DC

## 2018-11-27 MED ORDER — BEVACIZUMAB CHEMO INJECTION 1.25MG/0.05ML SYRINGE FOR KALEIDOSCOPE
1.2500 mg | INTRAVITREAL | Status: AC | PRN
  Administered 2018-11-27: 1.25 mg via INTRAVITREAL

## 2018-12-26 NOTE — Progress Notes (Addendum)
[facility] Note  12/27/2018     CHIEF COMPLAINT Patient presents for Retina Follow Up   HISTORY OF PRESENT ILLNESS: Gregory Tito. is a 73 y.o. male who presents to the clinic today for:   HPI    Retina Follow Up    Patient presents with  Other.  In left eye.  This started months ago.  Severity is moderate.  Duration of 4 weeks.  Since onset it is stable.  I, the attending physician,  performed the HPI with the patient and updated documentation appropriately.          Comments    73 y/o male pt here for 4 wk f/u for VH OS.  Pt had cat sx OS w/Dr. Kathlen Mody since last visit, and is scheduled for cat sx w/Dr. Kathlen Mody OD in 1 wk.  VA OS improved s/p cat sx.  No change in [place] OD.  Denies pain, flashes, floaters.  Does not ck BS.  Omni QID OS Brimonidine BID OU       Last edited by Bernarda Caffey, MD on 12/27/2018 10:09 AM. (History)    pt had cataract and goniotomy sx OS with Dr. Kathlen Mody on 12/20/18, pt states he saw Dr. Kathlen Mody yesterday and everything is healing well, pt states he is using brimonidine BID OU as well as the post op drop from [place], pts right eye is schedule for September 16   Referring physician:   HISTORICAL INFORMATION:   Selected notes from the MEDICAL RECORD NUMBER Referred by ED for RD OS LEE:  Ocular Hx- PMH-DM [takes glimeperide (A1c: 5.6, 04/16/18) BS: 239 (04.10.20)]    CURRENT MEDICATIONS: Current Outpatient Medications (Ophthalmic Drugs)  Medication Sig  . brimonidine (ALPHAGAN) 0.2 % ophthalmic solution Place 1 drop into both eyes 2 (two) times daily.   No current facility-administered medications for this visit.  (Ophthalmic Drugs)   Current Outpatient Medications (Other)  Medication Sig  . amLODipine (NORVASC) 10 MG tablet Take 1 tablet (10 mg total) by mouth daily.  . citalopram (CELEXA) 10 MG tablet every evening.  . COMBIVENT RESPIMAT 20-100 MCG/ACT AERS respimat Inhale 1 puff into the lungs every 6 (six)  hours as needed for shortness of breath.  . ferrous sulfate 325 (65 FE) MG tablet Take 1 tablet (325 mg total) by mouth 2 (two) times daily with a meal. (Patient taking differently: Take 325 mg by mouth daily. )  . FLUZONE HIGH-DOSE QUADRIVALENT 0.7 ML SUSY   . folic acid (FOLVITE) 1 MG tablet Take 1 tablet (1 mg total) by mouth daily.  Marland Kitchen gabapentin (NEURONTIN) 300 MG capsule Take 300 mg by mouth 3 (three) times daily.  Marland Kitchen glimepiride (AMARYL) 4 MG tablet Take 4 mg by mouth daily.  . Ipratropium-Albuterol (COMBIVENT RESPIMAT) 20-100 MCG/ACT AERS respimat Inhale 1 puff into the lungs every 6 (six) hours as needed for wheezing or shortness of breath.  . losartan (COZAAR) 100 MG tablet Take 100 mg by mouth every evening.   . mometasone-formoterol (DULERA) 100-5 MCG/ACT AERO Inhale 2 puffs into the lungs daily.  . Multiple Vitamin (MULTIVITAMIN WITH MINERALS) TABS tablet Take 1 tablet by mouth daily.  . pantoprazole (PROTONIX) 40 MG tablet Take 1 tablet (40 mg total) by mouth daily.  . simvastatin (ZOCOR) 10 MG tablet TAKE 1 TABLET BY MOUTH EVERYDAY AT BEDTIME  . sucralfate (CARAFATE) 1 g tablet Take 1 tablet (1 g total) by mouth 4 (four) times daily.  Marland Kitchen thiamine  100 MG tablet Take 1 tablet (100 mg total) by mouth daily.  Marland Kitchen tiotropium (SPIRIVA HANDIHALER) 18 MCG inhalation capsule Place 1 capsule (18 mcg total) into inhaler and inhale daily.  . traZODone (DESYREL) 50 MG tablet Take 50 mg by mouth at bedtime as needed for sleep.  Marland Kitchen amLODipine (NORVASC) 5 MG tablet Take 5 mg by mouth daily.  Marland Kitchen gabapentin (NEURONTIN) 300 MG capsule Take 300 mg by mouth 3 (three) times daily.  Marland Kitchen glimepiride (AMARYL) 4 MG tablet Take 4 mg by mouth every evening.   Marland Kitchen losartan (COZAAR) 50 MG tablet    No current facility-administered medications for this visit.  (Other)      REVIEW OF SYSTEMS: ROS    Positive for: Endocrine, Eyes   Negative for: Constitutional, Gastrointestinal, Neurological, Skin, Genitourinary,  Musculoskeletal, HENT, Cardiovascular, Respiratory, Psychiatric, Allergic/Imm, Heme/Lymph   Last edited by Matthew Folks, COA on 12/27/2018  9:36 AM. (History)       ALLERGIES Allergies  Allergen Reactions  . Penicillins     Nervous   . Penicillins Anxiety and Other (See Comments)    DID THE REACTION INVOLVE: Swelling of the face/tongue/throat, SOB, or low BP? No Sudden or severe rash/hives, skin peeling, or the inside of the mouth or nose? No Did it require medical treatment? No When did it last happen?many years ago If all above answers are "NO", may proceed with cephalosporin use.       PAST MEDICAL HISTORY Past Medical History:  Diagnosis Date  . Cancer [facility])    prostate  . Cataract    OD  . Depression   . Diabetes mellitus without complication ([place])   . Diabetic retinopathy ([place])    NPDR OU  . GERD (gastroesophageal reflux disease)    if drinks alcohol  . 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 Hyperlipidemia   . Hypertension   . Hypertensive retinopathy    OU  . Malignant neoplasm of prostate ([place]) 01/09/2014  . Pancreatitis   . Prostate cancer ([place]) 12/19/13   Gleason 4+3=7, volume 31.31 cc  . Shortness of breath dyspnea    with exertion    Past Surgical History:  Procedure Laterality Date  . BIOPSY  04/16/2018   Procedure: BIOPSY;  Surgeon: Juanita Craver, MD;  Location: [facility] ENDOSCOPY;  Service: Endoscopy;;  . biopsy on throat     hx of   . CATARACT EXTRACTION Left    Dr. Quentin Ore  . ESOPHAGOGASTRODUODENOSCOPY Left 04/16/2018   Procedure: ESOPHAGOGASTRODUODENOSCOPY (EGD);  Surgeon: Juanita Craver, MD;  Location: [facility];  Service: Endoscopy;  Laterality: Left;  . EYE SURGERY    . FOOT SURGERY    . HOT HEMOSTASIS N/A 04/16/2018   Procedure: HOT HEMOSTASIS (ARGON PLASMA COAGULATION/BICAP);  Surgeon: Juanita Craver, MD;  Location: [facility];  Service: Endoscopy;   Laterality: N/A;  . LYMPHADENECTOMY Bilateral 02/27/2014   Procedure: BILATERAL LYMPHADENECTOMY;  Surgeon: Alexis Frock, MD;  Location: [facility] ORS;  Service: Urology;  Laterality: Bilateral;  . PROSTATE BIOPSY  12/2013   Gleason 4+3=7, volume 31.31 cc  . ROBOT ASSISTED LAPAROSCOPIC RADICAL PROSTATECTOMY N/A 02/27/2014   Procedure: ROBOTIC ASSISTED LAPAROSCOPIC RADICAL PROSTATECTOMY WITH INDOCYANINE GREEN DYE;  Surgeon: Alexis Frock, MD;  Location: [facility];  Service: Urology;  Laterality: N/A;    FAMILY HISTORY Family History  Problem Relation Age of Onset  . Heart disease Mother   . Cancer Sister  breast  . Colon cancer Neg Hx   . Esophageal cancer Neg Hx   . Rectal cancer Neg Hx   . Stomach cancer Neg Hx     SOCIAL HISTORY Social History   Tobacco Use  . Smoking status: Current Every Day Smoker  . Smokeless tobacco: Never Used  . Tobacco comment: smokes a couple every day or two  Substance Use Topics  . Alcohol use: Yes    Comment: Patient states no alcohol in 2 years.   . Drug use: Yes    Types: Marijuana, Cocaine    Comment: past hx approx 30 years ago          OPHTHALMIC EXAM:  Base Eye Exam    Visual Acuity (Snellen - Linear)      Right Left   Dist [place] 20/40 20/50   Dist ph [place] 20/25 NI       Tonometry (Tonopen, 9:42 AM)      Right Left   Pressure 14 16       Pupils      Dark Light Shape React APD   Right 3 2 Round Brisk None   Left 3 2 Round Brisk None       Visual Fields (Counting fingers)      Left Right    Full Full       Extraocular Movement      Right Left    Full, Ortho Full, Ortho       Neuro/Psych    Oriented x3: Yes   Mood/Affect: Normal       Dilation    Both eyes: 1.0% Mydriacyl, 2.5% Phenylephrine @ 9:42 AM        Slit Lamp and Fundus Exam    Slit Lamp Exam      Right Left   Lids/Lashes Dermatochalasis - upper lid Dermatochalasis - upper lid   Conjunctiva/Sclera nasal Pinguecula, Melanosis nasal and temporal  Pinguecula, Melanosis   Cornea Arcus, 1-2+ inferior Punctate epithelial erosions Arcus, 2+ inferior Punctate epithelial erosions, Well healed cataract wounds   Anterior Chamber Deep, Narrow temporal angle Deep, narrow temporal angle   Iris Round and dilated, No NVI, PPM Round and dilated, No NVI, vertical PPM from 1100-0700   Lens 2+ Nuclear sclerosis, 3+ Cortical cataract PC IOL in good position with open PC   Vitreous Vitreous syneresis, Posterior vitreous detachment Vitreous syneresis with mild pigment / RBC, white VH shrinking and settling inferiorly, Posterior vitreous detachment       Fundus Exam      Right Left   Disc thin superior rim, Pink and Sharp, +cupping sharp rim, 1+ pallor, +cupping   C/D Ratio 0.6 0.6   Macula Flat, blunted foveal reflex, mild Epiretinal membrane, RPE mottling, scattered MA, no edema, focal IRH IT macula Flat, Blunted foveal reflex, mild cluster of exudate and MA with edema superior to fovea, scattered MA, Epiretinal membrane, scattered DBH - mostly superior macula   Vessels Vascular attenuation, mild tortuosity, Copper wiring, AV crossing changes Vascular attenuation, AV crossing changes, Tortuous, Copper wiring   Periphery Attached, scattered IRH posteriorly, blot heme superior to disc Attached, scattered MA/IRH, inferior periphery obscured by old VH          IMAGING AND PROCEDURES  Imaging and Procedures for @TODAY @  OCT, Retina - OU - Both Eyes       Right Eye Quality was good. Central Foveal Thickness: 271. Progression has been stable. Findings include normal foveal contour, no SRF, no IRF (Irregular  lamination, no DME, diffuse thinning).   Left Eye Quality was good. Central Foveal Thickness: 286. Progression has worsened. Findings include no SRF, normal foveal contour, epiretinal membrane, outer retinal atrophy, intraretinal fluid, intraretinal hyper-reflective material, macular pucker (Mild interval increase in IRF/IRHM superior fovea,  persistent vitreous opacitites).   Notes *Images captured and stored on drive  Diagnosis / Impression:  OD: NFP, no IRF/SRF OS: Abnormal foveal profile, +ERM/pucker, mild DME w/ Mild interval increase in IRF/IRHM superior fovea, persistent vitreous opacitites  Clinical management:  See below  Abbreviations: NFP - Normal foveal profile. CME - innmacular edema. PED - pigment epithelial detachment. IRF - intraretinal fluid. SRF - subretinal fluid. EZ - ellipsoid zone. ERM - epiretinal membrane. ORA - outer retinal atrophy. ORT - outer retinal tubulation. SRHM - subretinal hyper-reflective material        Intravitreal Injection, Pharmacologic Agent - OS - Left Eye       Time Out 12/27/2018. 10:19 AM. Confirmed correct patient, procedure, site, and patient consented.   Anesthesia Topical anesthesia was used. Anesthetic medications included Lidocaine 2%, Proparacaine 0.5%.   Procedure Preparation included 5% betadine to ocular surface, eyelid speculum. A 30 gauge needle was used.   Injection:  1.25 mg Bevacizumab (AVASTIN) SOLN   NDC: [id number], Lot: [id number] , Expiration date: 02/27/2019   Route: Intravitreal, Site: Left Eye, Waste: 0 mL  Post-op Post injection exam found visual acuity of at least counting fingers. The patient tolerated the procedure well. There were no complications. The patient received written and verbal post procedure care education.                 ASSESSMENT/PLAN:    ICD-10-CM   1. Vitreous hemorrhage of left eye (HCC)  H43.12 Intravitreal Injection, Pharmacologic Agent - OS - Left Eye    Bevacizumab (AVASTIN) SOLN 1.25 mg  2. Moderate nonproliferative diabetic retinopathy of both eyes with macular edema associated with type 2 diabetes mellitus (HCC)  24/01/2019 Intravitreal Injection, Pharmacologic Agent - OS - Left Eye    Bevacizumab (AVASTIN) SOLN 1.25 mg  3. Retinal edema  H35.81 OCT, Retina - OU - Both Eyes  4. Essential hypertension   I10   5. Hypertensive retinopathy of both eyes  H35.033   6. Combined forms of age-related cataract of right eye  H25.811   7. Pseudophakia  Z96.1   8. Bilateral ocular hypertension  H40.053   9. Glaucoma suspect of both eyes  H40.003     1. Vitreous Hemorrhage OS -- improving  - onset of floaters several weeks prior to presentation and significant vision loss noted around 04.06.20 by pt history  - unclear etiology, but suspect related to history of DM and HTN  - s/p IVA OS #1 (04.10.20), #2 (05.14.20), #3 (06.11.20), #4 (07.09.20), #5 (08.10.20)  - VH clearing centrally, turning white and settling inferiorly  - peripheral exam shows no RT/RD OS  - FA (05.14.20) shows vascular perfusion defects and late leaking MA, but no NV OS  - discussed findings and treatment options  - recommend IVA OS #6 (09.09.20) for vitreous hemorrhage and DME  - RBA of procedure discussed, questions answered  - informed consent obtained and signed  - see procedure note  - VH precautions reviewed -- minimize activities, keep head elevated, avoid ASA/NSAIDs/blood thinners as able  - f/u 4 weeks, sooner prn -- DFE/OCT/ possible injection  2,3. Moderate nonproliferative diabetic retinopathy w/ DME, OU  - exam shows scattered DBH OU  - OCT without diabetic macular  edema OD; OS: +DME --  interval increase in IRF/IRHM OS  - FA (5.14.20) shows late leaking MA OU, no NV OU  - treating DME with IVA as above  4,5. Hypertensive retinopathy OU  - discussed importance of tight BP control  - discussed possible contribution to [facility] OS  - monitor  6. Mixed form age related cataract OD  - under the expert care of Dr. Kathlen Mody  - scheduled for CE/IOL OD 01/03/19  7. Pseudophakia OS  - s/p CE/IOL OS (09.02.20, Dr. Kathlen Mody)  - beautiful surgery, doing well  - monitor  8,9. 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 Kathlen Mody)  Ophthalmic Meds Ordered this visit:  Meds ordered this encounter  Medications  . Bevacizumab (AVASTIN) SOLN 1.25 mg       Return in about 4 weeks (around 01/24/2019) for f/u DME and VH OS - Dilated Exam, OCT, Possible Injxn.  There are no Patient Instructions on file for this visit.   Explained the diagnoses, plan, and follow up with the patient and they expressed understanding.  Patient expressed understanding of the importance of proper follow up care.   This document serves as a record of services personally performed by Gardiner Sleeper, MD, PhD. It was created on their behalf by Ernest Mallick, OA, an ophthalmic assistant. The creation of this record is the provider's dictation and/or activities during the visit.    Electronically signed by: Ernest Mallick, OA  09.08.2020 1:08 PM    Gardiner Sleeper, M.D., Ph.D. Diseases & Surgery of the Retina and Vitreous [facility]  I have reviewed the above documentation for accuracy and completeness, and I agree with the above. Gardiner Sleeper, M.D., Ph.D. 12/27/18 1:08 PM    Abbreviations: M myopia (nearsighted); A astigmatism; H hyperopia (farsighted); P presbyopia; Mrx spectacle prescription;  CTL contact lenses; OD right eye; OS left eye; OU both eyes  XT exotropia; ET esotropia; PEK punctate epithelial keratitis; PEE punctate epithelial erosions; DES dry eye syndrome; MGD meibomian gland dysfunction; ATs artificial tears; PFAT's preservative free artificial tears; [place] nuclear sclerotic cataract; PSC posterior subcapsular cataract; ERM epi-retinal membrane; PVD posterior vitreous detachment; RD retinal detachment; DM diabetes mellitus; DR diabetic retinopathy; NPDR non-proliferative diabetic retinopathy; PDR proliferative diabetic retinopathy; CSME clinically significant macular edema; DME diabetic macular edema; dbh dot blot hemorrhages; CWS cotton wool spot; POAG primary  open angle glaucoma; C/D cup-to-disc ratio; HVF humphrey visual field; GVF goldmann visual field; OCT optical coherence tomography; IOP intraocular pressure; BRVO Branch retinal vein occlusion; CRVO central retinal vein occlusion; CRAO central retinal artery occlusion; BRAO branch retinal artery occlusion; RT retinal tear; SB scleral buckle; PPV pars plana vitrectomy; VH Vitreous hemorrhage; PRP panretinal laser photocoagulation; IVK intravitreal kenalog; VMT vitreomacular traction; MH Macular hole;  NVD neovascularization of the disc; NVE neovascularization elsewhere; AREDS age related eye disease study; ARMD age related macular degeneration; POAG primary open angle glaucoma; EBMD epithelial/anterior basement membrane dystrophy; ACIOL anterior chamber intraocular lens; IOL intraocular lens; PCIOL posterior chamber intraocular lens; Phaco/IOL phacoemulsification with intraocular lens placement; [place] photorefractive keratectomy; LASIK laser assisted in situ keratomileusis; HTN hypertension; DM diabetes mellitus; COPD chronic obstructive pulmonary disease

## 2018-12-27 ENCOUNTER — Ambulatory Visit (INDEPENDENT_AMBULATORY_CARE_PROVIDER_SITE_OTHER): Payer: Medicare HMO | Admitting: Ophthalmology

## 2018-12-27 ENCOUNTER — Other Ambulatory Visit: Payer: Self-pay

## 2018-12-27 ENCOUNTER — Encounter (INDEPENDENT_AMBULATORY_CARE_PROVIDER_SITE_OTHER): Payer: Self-pay | Admitting: Ophthalmology

## 2018-12-27 DIAGNOSIS — H35033 Hypertensive retinopathy, bilateral: Secondary | ICD-10-CM

## 2018-12-27 DIAGNOSIS — H40003 Preglaucoma, unspecified, bilateral: Secondary | ICD-10-CM

## 2018-12-27 DIAGNOSIS — H3581 Retinal edema: Secondary | ICD-10-CM

## 2018-12-27 DIAGNOSIS — H40053 Ocular hypertension, bilateral: Secondary | ICD-10-CM

## 2018-12-27 DIAGNOSIS — H4312 Vitreous hemorrhage, left eye: Secondary | ICD-10-CM | POA: Diagnosis not present

## 2018-12-27 DIAGNOSIS — E113313 Type 2 diabetes mellitus with moderate nonproliferative diabetic retinopathy with macular edema, bilateral: Secondary | ICD-10-CM | POA: Diagnosis not present

## 2018-12-27 DIAGNOSIS — H25811 Combined forms of age-related cataract, right eye: Secondary | ICD-10-CM

## 2018-12-27 DIAGNOSIS — Z961 Presence of intraocular lens: Secondary | ICD-10-CM

## 2018-12-27 DIAGNOSIS — I10 Essential (primary) hypertension: Secondary | ICD-10-CM

## 2018-12-27 MED ORDER — BEVACIZUMAB CHEMO INJECTION 1.25MG/0.05ML SYRINGE FOR KALEIDOSCOPE
1.2500 mg | INTRAVITREAL | Status: AC | PRN
  Administered 2018-12-27: 1.25 mg via INTRAVITREAL

## 2019-01-10 ENCOUNTER — Other Ambulatory Visit: Payer: Self-pay

## 2019-01-10 ENCOUNTER — Encounter (HOSPITAL_COMMUNITY): Payer: Self-pay | Admitting: Emergency Medicine

## 2019-01-10 ENCOUNTER — Ambulatory Visit (HOSPITAL_COMMUNITY)
Admission: EM | Admit: 2019-01-10 | Discharge: 2019-01-10 | Disposition: A | Discharge status: Home / Self Care | Payer: Medicare HMO | Attending: Family Medicine | Admitting: Family Medicine

## 2019-01-10 DIAGNOSIS — K219 Gastro-esophageal reflux disease without esophagitis: Secondary | ICD-10-CM | POA: Insufficient documentation

## 2019-01-10 DIAGNOSIS — E785 Hyperlipidemia, unspecified: Secondary | ICD-10-CM | POA: Insufficient documentation

## 2019-01-10 DIAGNOSIS — F329 Major depressive disorder, single episode, unspecified: Secondary | ICD-10-CM | POA: Insufficient documentation

## 2019-01-10 DIAGNOSIS — N476 Balanoposthitis: Secondary | ICD-10-CM | POA: Diagnosis not present

## 2019-01-10 DIAGNOSIS — Z8249 Family history of ischemic heart disease and other diseases of the circulatory system: Secondary | ICD-10-CM | POA: Insufficient documentation

## 2019-01-10 DIAGNOSIS — Z9079 Acquired absence of other genital organ(s): Secondary | ICD-10-CM | POA: Diagnosis not present

## 2019-01-10 DIAGNOSIS — Z7951 Long term (current) use of inhaled steroids: Secondary | ICD-10-CM | POA: Diagnosis not present

## 2019-01-10 DIAGNOSIS — I10 Essential (primary) hypertension: Secondary | ICD-10-CM | POA: Insufficient documentation

## 2019-01-10 DIAGNOSIS — E119 Type 2 diabetes mellitus without complications: Secondary | ICD-10-CM | POA: Insufficient documentation

## 2019-01-10 DIAGNOSIS — Z7984 Long term (current) use of oral hypoglycemic drugs: Secondary | ICD-10-CM | POA: Insufficient documentation

## 2019-01-10 DIAGNOSIS — R300 Dysuria: Secondary | ICD-10-CM | POA: Diagnosis not present

## 2019-01-10 DIAGNOSIS — Z880 Allergy status to penicillin: Secondary | ICD-10-CM | POA: Insufficient documentation

## 2019-01-10 DIAGNOSIS — Z8546 Personal history of malignant neoplasm of prostate: Secondary | ICD-10-CM | POA: Diagnosis not present

## 2019-01-10 DIAGNOSIS — N471 Phimosis: Secondary | ICD-10-CM | POA: Diagnosis not present

## 2019-01-10 DIAGNOSIS — F17210 Nicotine dependence, cigarettes, uncomplicated: Secondary | ICD-10-CM | POA: Diagnosis not present

## 2019-01-10 DIAGNOSIS — Z79899 Other long term (current) drug therapy: Secondary | ICD-10-CM | POA: Insufficient documentation

## 2019-01-10 DIAGNOSIS — R1909 Other intra-abdominal and pelvic swelling, mass and lump: Secondary | ICD-10-CM | POA: Diagnosis present

## 2019-01-10 LAB — POCT URINALYSIS DIP (DEVICE)
Bilirubin Urine: NEGATIVE
Glucose, UA: 500 mg/dL — AB
Ketones, ur: NEGATIVE mg/dL
Nitrite: NEGATIVE
Protein, ur: >=300 mg/dL — AB
Specific Gravity, Urine: 1.025 (ref 1.005–1.030)
Urobilinogen, UA: 1 mg/dL (ref 0.0–1.0)
pH: 6.5 (ref 5.0–8.0)

## 2019-01-10 MED ORDER — BETAMETHASONE DIPROPIONATE 0.05 % EX CREA: TOPICAL_CREAM | Freq: Two times a day (BID) | CUTANEOUS | 0 refills | Status: DC

## 2019-01-10 MED ORDER — NYSTATIN 100000 UNIT/GM EX CREA: TOPICAL_CREAM | CUTANEOUS | 0 refills | Status: DC

## 2019-01-10 MED ORDER — FLUCONAZOLE 150 MG PO TABS: 150.0000 mg | ORAL_TABLET | ORAL | 0 refills | Status: DC

## 2019-01-10 NOTE — ED Notes (Signed)
In treatment room for provider exam of patient

## 2019-01-10 NOTE — Discharge Instructions (Signed)
Please take 1 tablet of Diflucan today, repeat next Wednesday. Apply nystatin cream twice daily around foreskin and tip of penis Keep area clean and dry You may also apply steroid cream/betamethasone twice daily to further help with swelling and inflammation  Follow-up if symptoms not resolving, worsening, unable to urinate or pull foreskin back

## 2019-01-10 NOTE — ED Provider Notes (Addendum)
[name]    CSN: [id number] Arrival date & time: 01/10/19  1044      History   Chief Complaint Chief Complaint  Patient presents with  . Groin Swelling    HPI Gregory Nunez. is a 73 y.o. male history of prostate cancer status post prostatectomy, diabetes type 2, hypertension, hyperlipidemia, presenting today for evaluation of inflammation and swelling of his foreskin.  States that he has had history of similar approximately 20 years ago.  States that over the past couple weeks he has had increased difficulty retracting his foreskin and returning to normal position.  When he tries to retract it feels dry and has associated pain.  Does have some itching.  Notes that the tip of his penis also appears red.  Has some slight stinging with urination.  He does not believe this is STD related ICC is not been sexually active since his prostate removal.  Tried using triple antibiotic ointment without relief.  HPI  Past Medical History:  Diagnosis Date  . Cancer [facility])    prostate  . Cataract    OD  . Depression   . Diabetes mellitus without complication ([place])   . Diabetic retinopathy ([place])    NPDR OU  . GERD (gastroesophageal reflux disease)    if drinks alcohol  . 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 Hyperlipidemia   . Hypertension   . Hypertensive retinopathy    OU  . Malignant neoplasm of prostate ([place]) 01/09/2014  . Pancreatitis   . Prostate cancer ([place]) 12/19/13   Gleason 4+3=7, volume 31.31 cc  . Shortness of breath dyspnea    with exertion     Patient Active Problem List   Diagnosis Date Noted  . Unresponsive 11/08/2018  . Syncope, convulsive 11/08/2018  . Essential hypertension 11/08/2018  . Alcohol withdrawal seizure with complication, with unspecified complication ([place]) [id number]  . Type 2 diabetes mellitus without complication ([place]) [id number]  . Symptomatic anemia  04/15/2018  . Prostate cancer ([place]) 02/27/2014  . Malignant neoplasm of prostate ([place]) 01/09/2014  . Bunion 01/17/2013  . 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 BIOPSY  04/16/2018   Procedure: BIOPSY;  Surgeon: Juanita Craver, MD;  Location: [facility];  Service: Endoscopy;;  . biopsy on throat     hx of   . CATARACT EXTRACTION Left    Dr. Quentin Ore  . ESOPHAGOGASTRODUODENOSCOPY Left 04/16/2018   Procedure: ESOPHAGOGASTRODUODENOSCOPY (EGD);  Surgeon: Juanita Craver, MD;  Location: [facility];  Service: Endoscopy;  Laterality: Left;  . EYE SURGERY    . FOOT SURGERY    . HOT HEMOSTASIS N/A 04/16/2018   Procedure: HOT HEMOSTASIS (ARGON PLASMA COAGULATION/BICAP);  Surgeon: Juanita Craver, MD;  Location: [facility];  Service: Endoscopy;  Laterality: N/A;  . LYMPHADENECTOMY Bilateral 02/27/2014   Procedure: BILATERAL LYMPHADENECTOMY;  Surgeon: Alexis Frock, MD;  Location: [facility];  Service: Urology;  Laterality: Bilateral;  . PROSTATE BIOPSY  12/2013   Gleason 4+3=7, volume 31.31 cc  . ROBOT ASSISTED LAPAROSCOPIC RADICAL PROSTATECTOMY N/A 02/27/2014   Procedure: ROBOTIC ASSISTED LAPAROSCOPIC RADICAL PROSTATECTOMY WITH INDOCYANINE GREEN DYE;  Surgeon: Alexis Frock, MD;  Location: [facility];  Service: Urology;  Laterality: N/A;       Home Medications    Prior to Admission medications   Medication Sig Start Date End Date Taking? Authorizing Provider  amLODipine (NORVASC)  10 MG tablet Take 1 tablet (10 mg total) by mouth daily. 11/10/18   Mikhail, Velta Addison, DO  amLODipine (NORVASC) 5 MG tablet Take 5 mg by mouth daily. 09/23/18   [provider]  betamethasone dipropionate (DIPROLENE) 0.05 % cream Apply topically 2 (two) times daily. 01/10/19   Dariah Mcsorley Nunez, Gregory Nunez  brimonidine (ALPHAGAN) 0.2 % ophthalmic solution Place 1 drop into both eyes 2 (two) times daily. 11/27/18   Bernarda Caffey, MD  citalopram (CELEXA) 10 MG tablet  every evening. 11/23/18   [provider]  COMBIVENT RESPIMAT 20-100 MCG/ACT AERS respimat Inhale 1 puff into the lungs every 6 (six) hours as needed for shortness of breath. 06/12/18   [provider]  ferrous sulfate 325 (65 FE) MG tablet Take 1 tablet (325 mg total) by mouth 2 (two) times daily with a meal. Patient taking differently: Take 325 mg by mouth daily.  04/17/18 04/17/19  Dana Allan I, MD  fluconazole (DIFLUCAN) 150 MG tablet Take 1 tablet (150 mg total) by mouth once a week. 01/10/19   Lj Miyamoto Nunez, Gregory Nunez  FLUZONE HIGH-DOSE QUADRIVALENT 0.7 ML SUSY  12/22/18   [provider]  folic acid (FOLVITE) 1 MG tablet Take 1 tablet (1 mg total) by mouth daily. 11/10/18   Mikhail, Velta Addison, DO  gabapentin (NEURONTIN) 300 MG capsule Take 300 mg by mouth 3 (three) times daily. 06/06/18   [provider]  gabapentin (NEURONTIN) 300 MG capsule Take 300 mg by mouth 3 (three) times daily. 10/03/18   [provider]  glimepiride (AMARYL) 4 MG tablet Take 4 mg by mouth every evening.     [provider]  glimepiride (AMARYL) 4 MG tablet Take 4 mg by mouth daily. 10/07/18   [provider]  Ipratropium-Albuterol (COMBIVENT RESPIMAT) 20-100 MCG/ACT AERS respimat Inhale 1 puff into the lungs every 6 (six) hours as needed for wheezing or shortness of breath. 04/17/18   Bonnell Public, MD  losartan (COZAAR) 100 MG tablet Take 100 mg by mouth every evening.     [provider]  losartan (COZAAR) 50 MG tablet  10/22/18   [provider]  mometasone-formoterol (DULERA) 100-5 MCG/ACT AERO Inhale 2 puffs into the lungs daily. 04/17/18   Bonnell Public, MD  Multiple Vitamin (MULTIVITAMIN WITH MINERALS) TABS tablet Take 1 tablet by mouth daily. 11/10/18   Cristal Ford, DO  nystatin cream (MYCOSTATIN) Apply to affected area 2 times daily 01/10/19   Ruthene Methvin Nunez, Gregory Nunez  pantoprazole (PROTONIX) 40 MG tablet Take 1 tablet (40  mg total) by mouth daily. 04/17/18 04/17/19  Dana Allan I, MD  simvastatin (ZOCOR) 10 MG tablet TAKE 1 TABLET BY MOUTH EVERYDAY AT BEDTIME 11/23/18   [provider]  sucralfate (CARAFATE) 1 g tablet Take 1 tablet (1 g total) by mouth 4 (four) times daily. 04/17/18 04/17/19  Dana Allan I, MD  sulfamethoxazole-trimethoprim (BACTRIM DS) 800-160 MG tablet Take 1 tablet by mouth 2 (two) times daily for 7 days. 01/12/19 01/19/19  Loura Halt A, NP  thiamine 100 MG tablet Take 1 tablet (100 mg total) by mouth daily. 11/13/18   Mikhail, Velta Addison, DO  tiotropium (SPIRIVA HANDIHALER) 18 MCG inhalation capsule Place 1 capsule (18 mcg total) into inhaler and inhale daily. 04/17/18 04/17/19  Dana Allan I, MD  traZODone (DESYREL) 50 MG tablet Take 50 mg by mouth at bedtime as needed for sleep.    [provider]    Family History Family History  Problem Relation  Age of Onset  . Heart disease Mother   . Cancer Sister        breast  . Colon cancer Neg Hx   . Esophageal cancer Neg Hx   . Rectal cancer Neg Hx   . Stomach cancer Neg Hx     Social History Social History   Tobacco Use  . Smoking status: Current Every Day Smoker    Packs/day: 0.50  . Smokeless tobacco: Never Used  . Tobacco comment: smokes a couple every day or two  Substance Use Topics  . Alcohol use: Yes    Comment: Patient states no alcohol in 2 years.   . Drug use: Yes    Types: Marijuana, Cocaine    Comment: past hx approx 30 years ago      Allergies   Penicillins and Penicillins   Review of Systems Review of Systems  Constitutional: Negative for fatigue and fever.  Eyes: Negative for redness, itching and visual disturbance.  Respiratory: Negative for shortness of breath.   Cardiovascular: Negative for chest pain and leg swelling.  Gastrointestinal: Negative for nausea and vomiting.  Genitourinary: Positive for penile pain and penile swelling.  Musculoskeletal: Negative for  arthralgias and myalgias.  Skin: Positive for color change. Negative for rash and wound.  Neurological: Negative for dizziness, syncope, weakness, light-headedness and headaches.     Physical Exam Triage Vital Signs ED Triage Vitals  Enc Vitals Group     BP 01/10/19 1107 (!) 155/105     Pulse Rate 01/10/19 1107 93     Resp 01/10/19 1107 18     Temp 01/10/19 1107 97.9 F (36.6 Nunez)     Temp Source 01/10/19 1107 Tympanic     SpO2 01/10/19 1107 99 %     Weight --      Height --      Head Circumference --      Peak Flow --      Pain Score 01/10/19 1106 7     Pain Loc --      Pain Edu? --      Excl. in [place]? --    No data found.  Updated Vital Signs BP (!) 155/105 (BP Location: Right Arm)   Pulse 93   Temp 97.9 F (36.6 Nunez) (Tympanic)   Resp 18   SpO2 99%   Visual Acuity Right Eye Distance:   Left Eye Distance:   Bilateral Distance:    Right Eye Near:   Left Eye Near:    Bilateral Near:     Physical Exam Vitals signs and nursing note reviewed.  Constitutional:      Appearance: He is well-developed.     Comments: No acute distress  HENT:     Head: Normocephalic and atraumatic.     Nose: Nose normal.  Eyes:     Conjunctiva/sclera: Conjunctivae normal.  Neck:     Musculoskeletal: Neck supple.  Cardiovascular:     Rate and Rhythm: Normal rate.  Pulmonary:     Effort: Pulmonary effort is normal. No respiratory distress.  Abdominal:     General: There is no distension.  Genitourinary:    Comments: Foreskin of penis swollen, erythematous and multiple small cuts, unable to fully retract, glans of penis is very erythematous, small amount of whitish discharge noted around glans, no discharge noted from meatus Musculoskeletal: Normal range of motion.  Skin:    General: Skin is warm and dry.  Neurological:     Mental Status: He is alert and  oriented to person, place, and time.      UC Treatments / Results  Labs (all labs ordered are listed, but only abnormal  results are displayed) Labs Reviewed  URINE CULTURE - Abnormal; Notable for the following components:      Result Value   Culture >=100,000 COLONIES/mL ESCHERICHIA COLI (*)    Organism ID, Bacteria ESCHERICHIA COLI (*)    All other components within normal limits  POCT URINALYSIS DIP (DEVICE) - Abnormal; Notable for the following components:   Glucose, UA 500 (*)    Hgb urine dipstick MODERATE (*)    Protein, ur >=300 (*)    Leukocytes,Ua TRACE (*)    All other components within normal limits    EKG   Radiology No results found.  Procedures Procedures (including critical care time)  Medications Ordered in UC Medications - No data to display  Initial Impression / Assessment and Plan / UC Course  I have reviewed the triage vital signs and the nursing notes.  Pertinent labs & imaging results that were available during my care of the patient were reviewed by me and considered in my medical decision making (see chart for details).  Clinical Course as of Jan 15 939  Fri Jan 12, 2019  1239 Organism ID, Bacteria(!): ESCHERICHIA COLI [TB]  1239 Urine culture(!) [TB]    Clinical Course User Index [TB] Loura Halt A, NP    Patient appears to have phimosis/balanitis likely secondary to yeast.  Dysuria likely related to inflammation externally, but will send urine for culture to confirm.  Has glucose in urine which is likely contributing to symptoms.  No signs of urinary retention at this time.  Treating with nystatin cream twice daily as well as betamethasone.  Monitor for gradual resolution,Discussed strict return precautions. Patient verbalized understanding and is agreeable with plan.  Culture returned positive for E. coli.  Patient was placed on Bactrim.  Final Clinical Impressions(s) / UC Diagnoses   Final diagnoses:  Phimosis  Balanoposthitis     Discharge Instructions     Please take 1 tablet of Diflucan today, repeat next Wednesday. Apply nystatin cream twice  daily around foreskin and tip of penis Keep area clean and dry You may also apply steroid cream/betamethasone twice daily to further help with swelling and inflammation  Follow-up if symptoms not resolving, worsening, unable to urinate or pull foreskin back    ED Prescriptions    Medication Sig Dispense Auth. Provider   nystatin cream (MYCOSTATIN) Apply to affected area 2 times daily 30 g Kaho Selle Nunez, Gregory Nunez   betamethasone dipropionate (DIPROLENE) 0.05 % cream Apply topically 2 (two) times daily. 30 g Nyla Creason Nunez, Gregory Nunez   fluconazole (DIFLUCAN) 150 MG tablet Take 1 tablet (150 mg total) by mouth once a week. 2 tablet Gregory Nunez, Gregory Nunez, Gregory Nunez     PDMP not reviewed this encounter.   Janith Lima, Gregory Nunez 01/10/19 [address] [place], [place] 01/15/19 [phone]

## 2019-01-10 NOTE — ED Triage Notes (Signed)
Pt presents to [facility] for foreskin swelling x 2 weeks.  Hx of same, and uses Mupirocin, but he is out.  Patient states he has a hx prostate cancer and c/o penile "dryness" since.

## 2019-01-12 ENCOUNTER — Telehealth (HOSPITAL_COMMUNITY): Payer: Self-pay | Admitting: Emergency Medicine

## 2019-01-12 LAB — URINE CULTURE: Culture: >=100000 — AB

## 2019-01-12 MED ORDER — SULFAMETHOXAZOLE-TRIMETHOPRIM 800-160 MG PO TABS: 1.0000 | ORAL_TABLET | Freq: Two times a day (BID) | ORAL | 0 refills | Status: AC

## 2019-01-12 NOTE — Telephone Encounter (Signed)
Pt not started on antibiotics, per traci send Bactrim BID x7 days. Patient contacted and made aware of    results, all questions answered

## 2019-01-16 ENCOUNTER — Ambulatory Visit (INDEPENDENT_AMBULATORY_CARE_PROVIDER_SITE_OTHER): Payer: Medicare HMO | Admitting: Sports Medicine

## 2019-01-16 ENCOUNTER — Encounter: Payer: Self-pay | Admitting: Sports Medicine

## 2019-01-16 ENCOUNTER — Other Ambulatory Visit: Payer: Self-pay

## 2019-01-16 DIAGNOSIS — L989 Disorder of the skin and subcutaneous tissue, unspecified: Secondary | ICD-10-CM

## 2019-01-16 DIAGNOSIS — B351 Tinea unguium: Secondary | ICD-10-CM | POA: Diagnosis not present

## 2019-01-16 DIAGNOSIS — M79675 Pain in left toe(s): Secondary | ICD-10-CM

## 2019-01-16 DIAGNOSIS — L84 Corns and callosities: Secondary | ICD-10-CM | POA: Diagnosis not present

## 2019-01-16 DIAGNOSIS — M79674 Pain in right toe(s): Secondary | ICD-10-CM | POA: Diagnosis not present

## 2019-01-16 DIAGNOSIS — E1140 Type 2 diabetes mellitus with diabetic neuropathy, unspecified: Secondary | ICD-10-CM

## 2019-01-16 NOTE — Progress Notes (Signed)
Subjective: Gregory Nunez. is a 73 y.o. male patient with history of diabetes who presents to office today complaining of painful callus and nails while ambulating in shoes; unable to trim. His glucose reading this morning was not recorded,"Does not check"; last A1c 5.5.  No other issues noted.  Patient Active Problem List   Diagnosis Date Noted  . Unresponsive 11/08/2018  . Syncope, convulsive 11/08/2018  . Essential hypertension 11/08/2018  . Alcohol withdrawal seizure with complication, with unspecified complication ([place]) 62/56/3893  . Type 2 diabetes mellitus without complication ([place]) 73/42/8768  . Symptomatic anemia 04/15/2018  . Prostate cancer ([place]) 02/27/2014  . Malignant neoplasm of prostate ([place]) 01/09/2014  . Bunion 01/17/2013  . 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 amLODipine (NORVASC) 10 MG tablet Take 1 tablet (10 mg total) by mouth daily. 30 tablet 1  . amLODipine (NORVASC) 5 MG tablet Take 5 mg by mouth daily.    . betamethasone dipropionate (DIPROLENE) 0.05 % cream Apply topically 2 (two) times daily. 30 g 0  . brimonidine (ALPHAGAN) 0.2 % ophthalmic solution Place 1 drop into both eyes 2 (two) times daily. 10 mL 3  . citalopram (CELEXA) 10 MG tablet every evening.    . COMBIVENT RESPIMAT 20-100 MCG/ACT AERS respimat Inhale 1 puff into the lungs every 6 (six) hours as needed for shortness of breath.    . ferrous sulfate 325 (65 FE) MG tablet Take 1 tablet (325 mg total) by mouth 2 (two) times daily with a meal. (Patient taking differently: Take 325 mg by mouth daily. ) 30 tablet 3  . fluconazole (DIFLUCAN) 150 MG tablet Take 1 tablet (150 mg total) by mouth once a week. 2 tablet 0  . FLUZONE HIGH-DOSE QUADRIVALENT 0.7 ML SUSY     . folic acid (FOLVITE) 1 MG tablet Take 1 tablet (1 mg total) by mouth daily.    Marland Kitchen gabapentin (NEURONTIN) 300 MG capsule Take 300 mg by  mouth 3 (three) times daily.    Marland Kitchen gabapentin (NEURONTIN) 300 MG capsule Take 300 mg by mouth 3 (three) times daily.    Marland Kitchen glimepiride (AMARYL) 4 MG tablet Take 4 mg by mouth every evening.     Marland Kitchen glimepiride (AMARYL) 4 MG tablet Take 4 mg by mouth daily.    . Ipratropium-Albuterol (COMBIVENT RESPIMAT) 20-100 MCG/ACT AERS respimat Inhale 1 puff into the lungs every 6 (six) hours as needed for wheezing or shortness of breath. 4 g 1  . losartan (COZAAR) 100 MG tablet Take 100 mg by mouth every evening.     Marland Kitchen losartan (COZAAR) 50 MG tablet     . mometasone-formoterol (DULERA) 100-5 MCG/ACT AERO Inhale 2 puffs into the lungs daily. 13 g 1  . Multiple Vitamin (MULTIVITAMIN WITH MINERALS) TABS tablet Take 1 tablet by mouth daily.    Marland Kitchen nystatin cream (MYCOSTATIN) Apply to affected area 2 times daily 30 g 0  . pantoprazole (PROTONIX) 40 MG tablet Take 1 tablet (40 mg total) by mouth daily. 30 tablet 1  . simvastatin (ZOCOR) 10 MG tablet TAKE 1 TABLET BY MOUTH EVERYDAY AT BEDTIME    . sucralfate (CARAFATE) 1 g tablet Take 1 tablet (1 g total) by mouth 4 (four) times daily. 120 tablet 1  . sulfamethoxazole-trimethoprim (BACTRIM DS) 800-160 MG tablet Take 1 tablet by mouth 2 (two) times daily for 7 days. 14 tablet 0  . thiamine 100 MG  tablet Take 1 tablet (100 mg total) by mouth daily.    Marland Kitchen tiotropium (SPIRIVA HANDIHALER) 18 MCG inhalation capsule Place 1 capsule (18 mcg total) into inhaler and inhale daily. 30 capsule 2  . traZODone (DESYREL) 50 MG tablet Take 50 mg by mouth at bedtime as needed for sleep.     No current facility-administered medications on file prior to visit.    Allergies  Allergen Reactions  . Penicillins     Nervous   . Penicillins Anxiety and Other (See Comments)    DID THE REACTION INVOLVE: Swelling of the face/tongue/throat, SOB, or low BP? No Sudden or severe rash/hives, skin peeling, or the inside of the mouth or nose? No Did it require medical treatment? No When did it last  happen?many years ago If all above answers are "NO", may proceed with cephalosporin use.       Recent Results (from the past 2160 hour(s))  CBC with Differential/Platelet     Status: Abnormal   Collection Time: 11/07/18  9:54 PM  Result Value Ref Range   WBC 5.0 4.0 - 10.5 K/uL   RBC 4.44 4.22 - 5.81 MIL/uL   Hemoglobin 13.9 13.0 - 17.0 g/dL   HCT 41.1 39.0 - 52.0 %   MCV 92.6 80.0 - 100.0 fL   MCH 31.3 26.0 - 34.0 pg   MCHC 33.8 30.0 - 36.0 g/dL   RDW 11.7 11.5 - 15.5 %   Platelets 187 150 - 400 K/uL   nRBC 0.0 0.0 - 0.2 %   Neutrophils Relative % 32 %   Neutro Abs 1.6 (L) 1.7 - 7.7 K/uL   Lymphocytes Relative 55 %   Lymphs Abs 2.8 0.7 - 4.0 K/uL   Monocytes Relative 10 %   Monocytes Absolute 0.5 0.1 - 1.0 K/uL   Eosinophils Relative 2 %   Eosinophils Absolute 0.1 0.0 - 0.5 K/uL   Basophils Relative 0 %   Basophils Absolute 0.0 0.0 - 0.1 K/uL   Immature Granulocytes 1 %   Abs Immature Granulocytes 0.03 0.00 - 0.07 K/uL    Comment: Performed at [facility], [address] [place], [place] [address]  Comprehensive metabolic panel     Status: Abnormal   Collection Time: 11/07/18  9:54 PM  Result Value Ref Range   Sodium 137 135 - 145 mmol/L   Potassium 3.0 (L) 3.5 - 5.1 mmol/L   Chloride 104 98 - 111 mmol/L   CO2 22 22 - 32 mmol/L   Glucose, Bld 358 (H) 70 - 99 mg/dL   BUN 18 8 - 23 mg/dL   Creatinine, Ser 1.19 0.61 - 1.24 mg/dL   Calcium 9.3 8.9 - 10.3 mg/dL   Total Protein 7.6 6.5 - 8.1 g/dL   Albumin 3.0 (L) 3.5 - 5.0 g/dL   AST 43 (H) 15 - 41 U/L   ALT 50 (H) 0 - 44 U/L   Alkaline Phosphatase 64 38 - 126 U/L   Total Bilirubin 0.3 0.3 - 1.2 mg/dL   GFR calc non Af Amer >60 >60 mL/min   GFR calc Af Amer >60 >60 mL/min   Anion gap 11 5 - 15    Comment: Performed at [facility] [address] [place], [place] [address]  Protime-INR     Status: None   Collection Time: 11/07/18  9:54 PM  Result Value Ref Range   Prothrombin Time 12.2 11.4 -  15.2 seconds   INR 0.9 0.8 - 1.2  Comment: (NOTE) INR goal varies based on device and disease states. Performed at [facility], [place] [address] [place], [place] [address]   Ethanol     Status: None   Collection Time: 11/07/18  9:54 PM  Result Value Ref Range   Alcohol, Ethyl (B) <10 <10 mg/dL    Comment: (NOTE) Lowest detectable limit for serum alcohol is 10 mg/dL. For medical purposes only. Performed at [facility], [place] [address] [place], [place] [address]   Lactic acid, plasma     Status: Abnormal   Collection Time: 11/07/18  9:54 PM  Result Value Ref Range   Lactic Acid, Venous 2.4 (HH) 0.5 - 1.9 mmol/L    Comment: CRITICAL RESULT CALLED TO, READ BACK BY AND VERIFIED WITH: NEWNAM K,RN 11/07/18 2230 [place] AT 2245 Performed at [facility] [address], [place], [place] [address] CORRECTED ON 07/21 AT 2257: PREVIOUSLY REPORTED AS 2.4 CRITICAL RESULT CALLED TO, READ BACK BY AND VERIFIED WITH: NEWNAM K,RN 11/07/18 2230 WAYK   Rapid urine drug screen (hospital performed)     Status: None   Collection Time: 11/08/18 12:15 AM  Result Value Ref Range   Opiates NONE DETECTED NONE DETECTED   Cocaine NONE DETECTED NONE DETECTED   Benzodiazepines NONE DETECTED NONE DETECTED   Amphetamines NONE DETECTED NONE DETECTED   Tetrahydrocannabinol NONE DETECTED NONE DETECTED   Barbiturates NONE DETECTED NONE DETECTED    Comment: (NOTE) DRUG SCREEN FOR MEDICAL PURPOSES ONLY.  IF CONFIRMATION IS NEEDED FOR ANY PURPOSE, NOTIFY LAB WITHIN 5 DAYS. LOWEST DETECTABLE LIMITS FOR URINE DRUG SCREEN Drug Class                     Cutoff (ng/mL) Amphetamine and metabolites    1000 Barbiturate and metabolites    200 Benzodiazepine                 672 Tricyclics and metabolites     300 Opiates and metabolites        300 Cocaine and metabolites        300 THC                            50 Performed at [facility], [place] [address] [place], [place] [address]    SARS Coronavirus 2 (CEPHEID - Performed in [facility] hospital lab), Hosp Order     Status: None   Collection Time: 11/08/18  2:33 AM   Specimen: Nasopharyngeal Swab  Result Value Ref Range   SARS Coronavirus 2 NEGATIVE NEGATIVE    Comment: (NOTE) If result is NEGATIVE SARS-CoV-2 target nucleic acids are NOT DETECTED. The SARS-CoV-2 RNA is generally detectable in upper and lower  respiratory specimens during the acute phase of infection. The lowest  concentration of SARS-CoV-2 viral copies this assay can detect is 250  copies / mL. A negative result does not preclude SARS-CoV-2 infection  and should not be used as the sole basis for treatment or other  patient management decisions.  A negative result may occur with  improper specimen collection / handling, submission of specimen other  than nasopharyngeal swab, presence of viral mutation(s) within the  areas targeted by this assay, and inadequate number of viral copies  (<250 copies / mL). A negative result must be combined with clinical  observations, patient history, and epidemiological information. If result is POSITIVE SARS-CoV-2 target nucleic acids are  DETECTED. The SARS-CoV-2 RNA is generally detectable in upper and lower  respiratory specimens dur ing the acute phase of infection.  Positive  results are indicative of active infection with SARS-CoV-2.  Clinical  correlation with patient history and other diagnostic information is  necessary to determine patient infection status.  Positive results do  not rule out bacterial infection or co-infection with other viruses. If result is PRESUMPTIVE POSTIVE SARS-CoV-2 nucleic acids MAY BE PRESENT.   A presumptive positive result was obtained on the submitted specimen  and confirmed on repeat testing.  While 2019 novel coronavirus  (SARS-CoV-2) nucleic acids may be present in the submitted sample  additional confirmatory testing may be necessary for epidemiological  and / or  clinical management purposes  to differentiate between  SARS-CoV-2 and other Sarbecovirus currently known to infect humans.  If clinically indicated additional testing with an alternate test  methodology [phone]) is advised. The SARS-CoV-2 RNA is generally  detectable in upper and lower respiratory sp ecimens during the acute  phase of infection. The expected result is Negative. Fact Sheet for Patients:  StrictlyIdeas.no Fact Sheet for Healthcare Providers: BankingDealers.co.za This test is not yet approved or cleared by the [place] FDA and has been authorized for detection and/or diagnosis of SARS-CoV-2 by FDA under an Emergency Use Authorization (EUA).  This EUA will remain in effect (meaning this test can be used) for the duration of the COVID-19 declaration under Section 564(b)(1) of the Act, 21 U.S.C. section 360bbb-3(b)(1), unless the authorization is terminated or revoked sooner. Performed at [facility], [place] [address] [place], [place] [address]   CBG monitoring, ED     Status: Abnormal   Collection Time: 11/08/18  4:35 AM  Result Value Ref Range   Glucose-Capillary 297 (H) 70 - 99 mg/dL  CBC     Status: Abnormal   Collection Time: 11/08/18  9:23 AM  Result Value Ref Range   WBC 7.2 4.0 - 10.5 K/uL   RBC 4.25 4.22 - 5.81 MIL/uL   Hemoglobin 13.3 13.0 - 17.0 g/dL   HCT 38.8 (L) 39.0 - 52.0 %   MCV 91.3 80.0 - 100.0 fL   MCH 31.3 26.0 - 34.0 pg   MCHC 34.3 30.0 - 36.0 g/dL   RDW 11.8 11.5 - 15.5 %   Platelets 174 150 - 400 K/uL   nRBC 0.0 0.0 - 0.2 %    Comment: Performed at [facility], [place] [address] [place], [place] [address]  Creatinine, serum     Status: None   Collection Time: 11/08/18  9:23 AM  Result Value Ref Range   Creatinine, Ser 1.05 0.61 - 1.24 mg/dL   GFR calc non Af Amer >60 >60 mL/min   GFR calc Af Amer >60 >60 mL/min    Comment: Performed at [facility] [address], [place], [place] [address]  Glucose, capillary     Status: Abnormal   Collection Time: 11/08/18  9:45 AM  Result Value Ref Range   Glucose-Capillary 230 (H) 70 - 99 mg/dL  Lactic acid, plasma     Status: None   Collection Time: 11/08/18  9:58 AM  Result Value Ref Range   Lactic Acid, Venous 1.5 0.5 - 1.9 mmol/L    Comment: Performed at [facility], [place] [address], [place], [place] [address]  Glucose, capillary     Status: Abnormal   Collection Time: 11/08/18 12:22 PM  Result Value Ref Range  Glucose-Capillary 228 (H) 70 - 99 mg/dL  Glucose, capillary     Status: Abnormal   Collection Time: 11/08/18  4:51 PM  Result Value Ref Range   Glucose-Capillary 148 (H) 70 - 99 mg/dL  Glucose, capillary     Status: Abnormal   Collection Time: 11/08/18  9:09 PM  Result Value Ref Range   Glucose-Capillary 129 (H) 70 - 99 mg/dL  Basic metabolic panel     Status: Abnormal   Collection Time: 11/09/18  2:45 AM  Result Value Ref Range   Sodium 141 135 - 145 mmol/L   Potassium 3.7 3.5 - 5.1 mmol/L   Chloride 106 98 - 111 mmol/L   CO2 26 22 - 32 mmol/L   Glucose, Bld 134 (H) 70 - 99 mg/dL   BUN 14 8 - 23 mg/dL   Creatinine, Ser 1.11 0.61 - 1.24 mg/dL   Calcium 9.1 8.9 - 10.3 mg/dL   GFR calc non Af Amer NOT CALCULATED >60 mL/min   GFR calc Af Amer NOT CALCULATED >60 mL/min   Anion gap 9 5 - 15    Comment: Performed at [facility], [place] [address] [place], [place] [address]  CBC     Status: Abnormal   Collection Time: 11/09/18  2:45 AM  Result Value Ref Range   WBC 5.2 4.0 - 10.5 K/uL   RBC 4.11 (L) 4.22 - 5.81 MIL/uL   Hemoglobin 12.7 (L) 13.0 - 17.0 g/dL   HCT 38.6 (L) 39.0 - 52.0 %   MCV 93.9 80.0 - 100.0 fL   MCH 30.9 26.0 - 34.0 pg   MCHC 32.9 30.0 - 36.0 g/dL   RDW 11.9 11.5 - 15.5 %   Platelets 160 150 - 400 K/uL   nRBC 0.0 0.0 - 0.2 %    Comment: Performed at [facility], [place] [address] [place], [place] [address]  Magnesium     Status: Abnormal   Collection  Time: 11/09/18  2:45 AM  Result Value Ref Range   Magnesium 1.5 (L) 1.7 - 2.4 mg/dL    Comment: Performed at [facility] [address], [place], [place] [address]  Glucose, capillary     Status: None   Collection Time: 11/09/18  7:51 AM  Result Value Ref Range   Glucose-Capillary 91 70 - 99 mg/dL  Glucose, capillary     Status: Abnormal   Collection Time: 11/09/18 12:00 PM  Result Value Ref Range   Glucose-Capillary 137 (H) 70 - 99 mg/dL  POCT urinalysis dip (device)     Status: Abnormal   Collection Time: 01/10/19 11:32 AM  Result Value Ref Range   Glucose, UA 500 (A) NEGATIVE mg/dL   Bilirubin Urine NEGATIVE NEGATIVE   Ketones, ur NEGATIVE NEGATIVE mg/dL   Specific Gravity, Urine 1.025 1.005 - 1.030   Hgb urine dipstick MODERATE (A) NEGATIVE   pH 6.5 5.0 - 8.0   Protein, ur >=300 (A) NEGATIVE mg/dL   Urobilinogen, UA 1.0 0.0 - 1.0 mg/dL   Nitrite NEGATIVE NEGATIVE   Leukocytes,Ua TRACE (A) NEGATIVE    Comment: Biochemical Testing Only. Please order routine urinalysis from main lab if confirmatory testing is needed.  Urine culture     Status: Abnormal   Collection Time: 01/10/19 11:43 AM   Specimen: Urine, Random  Result Value Ref Range   Specimen Description URINE, RANDOM    Special Requests      NONE Performed at [facility], [address] [place], [place] [address]    Culture >=100,000 COLONIES/mL ESCHERICHIA COLI (A)    Report Status 01/12/2019 FINAL    Organism ID, Bacteria ESCHERICHIA COLI (A)       Susceptibility   Escherichia coli - MIC*    AMPICILLIN >=32 RESISTANT Resistant     CEFAZOLIN <=4 SENSITIVE Sensitive     CEFTRIAXONE <=1 SENSITIVE Sensitive     CIPROFLOXACIN >=4 RESISTANT Resistant     GENTAMICIN <=1 SENSITIVE Sensitive     IMIPENEM <=0.25 SENSITIVE Sensitive     NITROFURANTOIN <=16 SENSITIVE Sensitive     TRIMETH/SULFA <=20 SENSITIVE Sensitive     AMPICILLIN/SULBACTAM 16 INTERMEDIATE Intermediate     PIP/TAZO <=4 SENSITIVE  Sensitive     Extended ESBL NEGATIVE Sensitive     * >=100,000 COLONIES/mL ESCHERICHIA COLI    Objective: General: Patient is awake, alert, and oriented x 3 and in no acute distress.  Integument: Skin is warm, dry and supple bilateral. Nails are mildly elongated and mildly, thickened and dystrophic with subungual debris, consistent with onychomycosis, 1-5 bilateral. No signs of infection. Callus sub-met 5 bilateral and medial second toe on right and lateral first toe on the right with no signs of infection. Remaining integument unremarkable.  Vasculature:  Dorsalis Pedis pulse 2/4 bilateral. Posterior Tibial pulse  2/4 bilateral.  Capillary fill time <3 sec 1-5 bilateral. Positive hair growth to the level of the digits. Temperature gradient within normal limits. No varicosities present bilateral. No edema present bilateral.   Neurology: The patient has diminished sensation measured with a 5.07/10g Semmes Weinstein Monofilament at all pedal sites bilateral. Vibratory sensation diminished bilateral with tuning fork. No Babinski sign present bilateral.   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 Muscular strength 5/5 in all lower extremity muscular groups bilateral without pain on range of motion . No tenderness with calf compression bilateral.  Assessment and Plan: Problem List Items Addressed This Visit    None    Visit Diagnoses    Pain due to onychomycosis of toenails of both feet    -  Primary   Corns and callosities       Benign skin lesion       Type 2 diabetes, controlled, with neuropathy ([place])         -Examined patient. -Re-Discussed and educated patient on diabetic foot care, especially with  regards to the vascular, neurological and musculoskeletal systems. -Mechanically debrided nails x10 using a sterile nail nipper without incident and debrided callus 3 using sterile chisel blade without incident  -Continue with toe spacer for right foot as  needed; new spacer given at this visit again -Patient to return  in 2-2.5 months for at risk foot care -Patient advised to call the office if any problems or questions arise in the meantime.  Landis Martins, DPM

## 2019-01-18 NOTE — Progress Notes (Addendum)
[facility] Note  01/24/2019     CHIEF COMPLAINT Patient presents for Retina Follow Up   HISTORY OF PRESENT ILLNESS: Gregory Nunez. is a 73 y.o. male who presents to the clinic today for:  HPI    Retina Follow Up    Diagnosis: Vit heme.  In left eye.  Severity is moderate.  Duration of 4 weeks.  Since onset it is gradually improving.  I, the attending physician,  performed the HPI with the patient and updated documentation appropriately.          Comments    Patient states had CE with IOL OD on 09.16.20. Has now had cataract surgery in both eyes, improved vision OU. Does not see any floaters OS. BS hasn't checked. Last a1c unknown. Ran out of brimonidine (1 week ago) and timolol gtts (hasn't used) for IOP. Not using any post-op gtts from cataract surgery.       Last edited by Bernarda Caffey, MD on 01/24/2019  8:10 AM. (History)     Patient had CE with IOL OD with Dr. Kathlen Mody on 09.16.20 and CE with IOL OS on 09.02.20, also with Dr. Kathlen Mody. Vision seems better OU. Ran out of brimonidine and timolol. Not using any gtts right now. Has difficulty putting gtts in eye--patient lives alone.   Referring physician:   HISTORICAL INFORMATION:   Selected notes from the MEDICAL RECORD NUMBER Referred by ED for RD OS LEE:  Ocular Hx- PMH-DM [takes glimeperide (A1c: 5.6, 04/16/18) BS: 239 (04.10.20)]    CURRENT MEDICATIONS: Current Outpatient Medications (Ophthalmic Drugs)  Medication Sig  . brimonidine (ALPHAGAN) 0.2 % ophthalmic solution Place 1 drop into both eyes 2 (two) times daily. (Patient not taking: Reported on 01/24/2019)   No current facility-administered medications for this visit.  (Ophthalmic Drugs)   Current Outpatient Medications (Other)  Medication Sig  . amLODipine (NORVASC) 10 MG tablet Take 1 tablet (10 mg total) by mouth daily.  Marland Kitchen amLODipine (NORVASC) 5 MG tablet Take 5 mg by mouth daily.  . betamethasone dipropionate  (DIPROLENE) 0.05 % cream Apply topically 2 (two) times daily.  . citalopram (CELEXA) 10 MG tablet every evening.  . COMBIVENT RESPIMAT 20-100 MCG/ACT AERS respimat Inhale 1 puff into the lungs every 6 (six) hours as needed for shortness of breath.  . ferrous sulfate 325 (65 FE) MG tablet Take 1 tablet (325 mg total) by mouth 2 (two) times daily with a meal. (Patient taking differently: Take 325 mg by mouth daily. )  . fluconazole (DIFLUCAN) 150 MG tablet Take 1 tablet (150 mg total) by mouth once a week.  Marland Kitchen FLUZONE HIGH-DOSE QUADRIVALENT 0.7 ML SUSY   . folic acid (FOLVITE) 1 MG tablet Take 1 tablet (1 mg total) by mouth daily.  Marland Kitchen gabapentin (NEURONTIN) 300 MG capsule Take 300 mg by mouth 3 (three) times daily.  Marland Kitchen glimepiride (AMARYL) 4 MG tablet Take 4 mg by mouth every evening.   . Ipratropium-Albuterol (COMBIVENT RESPIMAT) 20-100 MCG/ACT AERS respimat Inhale 1 puff into the lungs every 6 (six) hours as needed for wheezing or shortness of breath.  . mometasone-formoterol (DULERA) 100-5 MCG/ACT AERO Inhale 2 puffs into the lungs daily.  . Multiple Vitamin (MULTIVITAMIN WITH MINERALS) TABS tablet Take 1 tablet by mouth daily.  Marland Kitchen nystatin cream (MYCOSTATIN) Apply to affected area 2 times daily  . pantoprazole (PROTONIX) 40 MG tablet Take 1 tablet (40 mg total) by mouth daily.  . simvastatin (ZOCOR) 10 MG  tablet TAKE 1 TABLET BY MOUTH EVERYDAY AT BEDTIME  . sucralfate (CARAFATE) 1 g tablet Take 1 tablet (1 g total) by mouth 4 (four) times daily.  Marland Kitchen thiamine 100 MG tablet Take 1 tablet (100 mg total) by mouth daily.  Marland Kitchen tiotropium (SPIRIVA HANDIHALER) 18 MCG inhalation capsule Place 1 capsule (18 mcg total) into inhaler and inhale daily.  . traZODone (DESYREL) 50 MG tablet Take 50 mg by mouth at bedtime as needed for sleep.  Marland Kitchen gabapentin (NEURONTIN) 300 MG capsule Take 300 mg by mouth 3 (three) times daily.  Marland Kitchen glimepiride (AMARYL) 4 MG tablet Take 4 mg by mouth daily.  Marland Kitchen losartan (COZAAR) 100 MG  tablet Take 100 mg by mouth every evening.   Marland Kitchen losartan (COZAAR) 50 MG tablet    No current facility-administered medications for this visit.  (Other)      REVIEW OF SYSTEMS: ROS    Positive for: Endocrine, Eyes   Negative for: Constitutional, Gastrointestinal, Neurological, Skin, Genitourinary, Musculoskeletal, HENT, Cardiovascular, Respiratory, Psychiatric, Allergic/Imm, Heme/Lymph   Last edited by Roselee Nova D, COT on 01/24/2019  8:04 AM. (History)       ALLERGIES Allergies  Allergen Reactions  . Penicillins     Nervous   . Penicillins Anxiety and Other (See Comments)    DID THE REACTION INVOLVE: Swelling of the face/tongue/throat, SOB, or low BP? No Sudden or severe rash/hives, skin peeling, or the inside of the mouth or nose? No Did it require medical treatment? No When did it last happen?many years ago If all above answers are "NO", may proceed with cephalosporin use.       PAST MEDICAL HISTORY Past Medical History:  Diagnosis Date  . Cancer [facility])    prostate  . Cataract    OD  . Depression   . Diabetes mellitus without complication ([place])   . Diabetic retinopathy ([place])    NPDR OU  . GERD (gastroesophageal reflux disease)    if drinks alcohol  . 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 Hyperlipidemia   . Hypertension   . Hypertensive retinopathy    OU  . Malignant neoplasm of prostate ([place]) 01/09/2014  . Pancreatitis   . Prostate cancer ([place]) 12/19/13   Gleason 4+3=7, volume 31.31 cc  . Shortness of breath dyspnea    with exertion    Past Surgical History:  Procedure Laterality Date  . BIOPSY  04/16/2018   Procedure: BIOPSY;  Surgeon: Juanita Craver, MD;  Location: [facility] ENDOSCOPY;  Service: Endoscopy;;  . biopsy on throat     hx of   . CATARACT EXTRACTION Bilateral    Dr. Quentin Ore  . ESOPHAGOGASTRODUODENOSCOPY Left 04/16/2018   Procedure: ESOPHAGOGASTRODUODENOSCOPY  (EGD);  Surgeon: Juanita Craver, MD;  Location: [facility];  Service: Endoscopy;  Laterality: Left;  . EYE SURGERY    . FOOT SURGERY    . HOT HEMOSTASIS N/A 04/16/2018   Procedure: HOT HEMOSTASIS (ARGON PLASMA COAGULATION/BICAP);  Surgeon: Juanita Craver, MD;  Location: [facility];  Service: Endoscopy;  Laterality: N/A;  . LYMPHADENECTOMY Bilateral 02/27/2014   Procedure: BILATERAL LYMPHADENECTOMY;  Surgeon: Alexis Frock, MD;  Location: [facility] ORS;  Service: Urology;  Laterality: Bilateral;  . PROSTATE BIOPSY  12/2013   Gleason 4+3=7, volume 31.31 cc  . ROBOT ASSISTED LAPAROSCOPIC RADICAL PROSTATECTOMY N/A 02/27/2014   Procedure: ROBOTIC ASSISTED LAPAROSCOPIC RADICAL PROSTATECTOMY WITH INDOCYANINE GREEN DYE;  Surgeon: Alexis Frock, MD;  Location: [facility] ORS;  Service: Urology;  Laterality: N/A;    FAMILY HISTORY Family History  Problem Relation Age of Onset  . Heart disease Mother   . Cancer Sister        breast  . Colon cancer Neg Hx   . Esophageal cancer Neg Hx   . Rectal cancer Neg Hx   . Stomach cancer Neg Hx     SOCIAL HISTORY Social History   Tobacco Use  . Smoking status: Current Every Day Smoker    Packs/day: 0.50  . Smokeless tobacco: Never Used  . Tobacco comment: smokes a couple every day or two  Substance Use Topics  . Alcohol use: Yes    Comment: Patient states no alcohol in 2 years.   . Drug use: Yes    Types: Marijuana, Cocaine    Comment: past hx approx 30 years ago          OPHTHALMIC EXAM:  Base Eye Exam    Visual Acuity (Snellen - Linear)      Right Left   Dist [place] 20/25 -2 20/50 +2   Dist ph  NI 20/40 -2       Tonometry (Tonopen, 8:19 AM)      Right Left   Pressure 18 17       Pupils      Dark Light Shape React APD   Right 4 3 Round Slow None   Left 4 3 Round Slow None       Visual Fields (Counting fingers)      Left Right    Full Full       Extraocular Movement      Right Left    Full, Ortho Full, Ortho       Neuro/Psych     Oriented x3: Yes   Mood/Affect: Normal       Dilation    Both eyes: 1.0% Mydriacyl, 2.5% Phenylephrine @ 8:19 AM        Slit Lamp and Fundus Exam    Slit Lamp Exam      Right Left   Lids/Lashes Dermatochalasis - upper lid Dermatochalasis - upper lid   Conjunctiva/Sclera nasal Pinguecula, Melanosis nasal and temporal Pinguecula, Melanosis   Cornea Arcus, 1-2+ inferior Punctate epithelial erosions, well healed temporal cataract wound Arcus, Well healed temporal cataract wounds   Anterior Chamber Deep and clear, 1/2+ pigment Deep, narrow temporal angle   Iris Round and dilated, No NVI, PPM Round and dilated, No NVI, vertical PPM from 1100-0700   Lens PC IOl in good position PC IOL in good position    Vitreous Mild syneresis Vitreous syneresis with mild pigment / RBC, white VH shrinking and settling inferiorly, Posterior vitreous detachment       Fundus Exam      Right Left   Disc thin superior rim, Pink and Sharp, +cupping sharp rim, 1+ pallor, +cupping   C/D Ratio 0.6 0.6   Macula Flat, good foveal reflex, mild Epiretinal membrane, RPE mottling, scattered MA--rare, no edema Flat, Blunted foveal reflex, mild cluster of exudate and MA with edema superior to fovea--slightly improved, scattered MA, Epiretinal membrane, scattered DBH - mostly superior macula   Vessels Vascular attenuation, mild tortuosity, Copper wiring, AV crossing changes Vascular attenuation, AV crossing changes, Tortuous, Copper wiring   Periphery Attached, scattered IRH posteriorly, blot heme superior to disc--improving Attached, scattered MA/IRH, inferior periphery obscured by old VH        Refraction    Manifest Refraction      Sphere  Cylinder Axis Dist VA   Right [place] Sphere  20/25-1   Left [place] +0.75 165 20/40+1          IMAGING AND PROCEDURES  Imaging and Procedures for @TODAY @  OCT, Retina - OU - Both Eyes       Right Eye Quality was good. Central Foveal Thickness: 274. Progression has been  stable. Findings include normal foveal contour, no SRF, no IRF (Irregular lamination, no DME, diffuse thinning, trace vitreous opacities).   Left Eye Quality was good. Central Foveal Thickness: 300. Progression has improved. Findings include no SRF, normal foveal contour, epiretinal membrane, outer retinal atrophy, intraretinal fluid, intraretinal hyper-reflective material, macular pucker (Mild interval improvement in IRF/IRHM superior fovea and vitreous opacities).   Notes *Images captured and stored on drive  Diagnosis / Impression:  OD: NFP, no IRF/SRF OS: Abnormal foveal profile, +ERM/pucker, mild DME w/ Mild interval improvement in IRF/IRHM superior fovea and vitreous opacitites  Clinical management:  See below  Abbreviations: NFP - Normal foveal profile. CME - innmacular edema. PED - pigment epithelial detachment. IRF - intraretinal fluid. SRF - subretinal fluid. EZ - ellipsoid zone. ERM - epiretinal membrane. ORA - outer retinal atrophy. ORT - outer retinal tubulation. SRHM - subretinal hyper-reflective material        Intravitreal Injection, Pharmacologic Agent - OS - Left Eye       Time Out 01/24/2019. 8:07 AM. Confirmed correct patient, procedure, site, and patient consented.   Anesthesia Topical anesthesia was used. Anesthetic medications included Lidocaine 2%, Proparacaine 0.5%.   Procedure Preparation included 5% betadine to ocular surface, eyelid speculum. A 30 gauge needle was used.   Injection:  1.25 mg Bevacizumab (AVASTIN) SOLN   NDC: [id number], Lot: [id number] , Expiration date: 04/05/2019   Route: Intravitreal, Site: Left Eye, Waste: 0 mL  Post-op Post injection exam found visual acuity of at least counting fingers. The patient tolerated the procedure well. There were no complications. The patient received written and verbal post procedure care education.                 ASSESSMENT/PLAN:    ICD-10-CM   1. Vitreous hemorrhage of left eye  (HCC)  H43.12 Intravitreal Injection, Pharmacologic Agent - OS - Left Eye    Bevacizumab (AVASTIN) SOLN 1.25 mg  2. Moderate nonproliferative diabetic retinopathy of both eyes with macular edema associated with type 2 diabetes mellitus (HCC)  04/18/2019 Intravitreal Injection, Pharmacologic Agent - OS - Left Eye    Bevacizumab (AVASTIN) SOLN 1.25 mg  3. Retinal edema  H35.81 OCT, Retina - OU - Both Eyes  4. Essential hypertension  I10   5. Hypertensive retinopathy of both eyes  H35.033   6. Combined forms of age-related cataract of right eye  H25.811   7. Pseudophakia  Z96.1   8. 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 Vitreous Hemorrhage OS -- improving  - onset of floaters several weeks prior to presentation and significant vision loss noted around 04.06.20 by pt history  - unclear etiology, but suspect related to history of DM and HTN  - s/p IVA OS #1 (04.10.20), #2 (05.14.20), #3 (06.11.20), #4 (07.09.20), #5 (08.10.20), # 6 (09.09.20)  - VH clearing centrally, turning white and settling inferiorly  - peripheral exam shows no RT/RD OS  -OCT shows interval improvement in IRF/IRHM superior fovea and vitreous opacities  - FA (05.14.20) shows vascular perfusion defects and late leaking MA, but no NV OS  - discussed findings and treatment options  -  BCVA 20/40 today, improved from 20/50  - recommend IVA OS #7 (10.07.20) for vitreous hemorrhage and DME  - RBA of procedure discussed, questions answered  - informed consent obtained and signed  - see procedure note  - VH precautions reviewed -- minimize activities, keep head elevated, avoid ASA/NSAIDs/blood thinners as able  - f/u 4 weeks, sooner prn -- DFE/OCT/ possible injection  2,3. Moderate nonproliferative diabetic retinopathy w/ DME, OU  - exam shows scattered DBH OU  - OCT without diabetic macular edema OD; OS: +DME --  interval increase in IRF/IRHM OS  - FA (5.14.20) shows late leaking MA OU, no NV OU  - treating DME with IVA  as above  4,5. Hypertensive retinopathy OU  - discussed importance of tight BP control  - discussed possible contribution to [facility] OS  - monitor  6. Pseudophakia OU  - s/p CE/IOL OS (09.02.20, Dr. Kathlen Mody), OD (09.16.20) Dr. Kathlen Mody  - beautiful surgeries, doing well  - still using combo drop OD  - montior  7,8. 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 Kathlen Mody)   Ophthalmic Meds Ordered this visit:  Meds ordered this encounter  Medications  . Bevacizumab (AVASTIN) SOLN 1.25 mg       Return in about 4 weeks (around 02/21/2019) for DFE, OCT.  There are no Patient Instructions on file for this visit.   Explained the diagnoses, plan, and follow up with the patient and they expressed understanding.  Patient expressed understanding of the importance of proper follow up care.   This document serves as a record of services personally performed by Gardiner Sleeper, MD, PhD. It was created on their behalf by Roselee Nova, COMT. The creation of this record is the provider's dictation and/or activities during the visit.  Electronically signed by: Roselee Nova, COMT 01/24/19 9:11 AM   Gardiner Sleeper, M.D., Ph.D. Diseases & Surgery of the Retina and [place] 01/24/19   I have reviewed the above documentation for accuracy and completeness, and I agree with the above. Gardiner Sleeper, M.D., Ph.D. 01/24/19 9:11 AM    Abbreviations: M myopia (nearsighted); A astigmatism; H hyperopia (farsighted); P presbyopia; Mrx spectacle prescription;  CTL contact lenses; OD right eye; OS left eye; OU both eyes  XT exotropia; ET esotropia; PEK punctate epithelial keratitis; PEE punctate epithelial erosions; DES dry eye syndrome; MGD meibomian gland dysfunction; ATs artificial tears; PFAT's preservative free artificial tears; [place]  nuclear sclerotic cataract; PSC posterior subcapsular cataract; ERM epi-retinal membrane; PVD posterior vitreous detachment; RD retinal detachment; DM diabetes mellitus; DR diabetic retinopathy; NPDR non-proliferative diabetic retinopathy; PDR proliferative diabetic retinopathy; CSME clinically significant macular edema; DME diabetic macular edema; dbh dot blot hemorrhages; CWS cotton wool spot; POAG primary open angle glaucoma; C/D cup-to-disc ratio; HVF humphrey visual field; GVF goldmann visual field; OCT optical coherence tomography; IOP intraocular pressure; BRVO Branch retinal vein occlusion; CRVO central retinal vein occlusion; CRAO central retinal artery occlusion; BRAO branch retinal artery occlusion; RT retinal tear; SB scleral buckle; PPV pars plana vitrectomy; VH Vitreous hemorrhage; PRP panretinal laser photocoagulation; IVK intravitreal kenalog; VMT vitreomacular traction; MH Macular hole;  NVD neovascularization of the disc; NVE neovascularization elsewhere; AREDS age related eye disease study; ARMD age related macular degeneration; POAG primary open angle glaucoma; EBMD epithelial/anterior  basement membrane dystrophy; ACIOL anterior chamber intraocular lens; IOL intraocular lens; PCIOL posterior chamber intraocular lens; Phaco/IOL phacoemulsification with intraocular lens placement; [place] photorefractive keratectomy; LASIK laser assisted in situ keratomileusis; HTN hypertension; DM diabetes mellitus; COPD chronic obstructive pulmonary disease

## 2019-01-24 ENCOUNTER — Ambulatory Visit (INDEPENDENT_AMBULATORY_CARE_PROVIDER_SITE_OTHER): Payer: Medicare HMO | Admitting: Ophthalmology

## 2019-01-24 ENCOUNTER — Encounter (INDEPENDENT_AMBULATORY_CARE_PROVIDER_SITE_OTHER): Payer: Self-pay | Admitting: Ophthalmology

## 2019-01-24 DIAGNOSIS — I10 Essential (primary) hypertension: Secondary | ICD-10-CM

## 2019-01-24 DIAGNOSIS — H40053 Ocular hypertension, bilateral: Secondary | ICD-10-CM

## 2019-01-24 DIAGNOSIS — H40003 Preglaucoma, unspecified, bilateral: Secondary | ICD-10-CM

## 2019-01-24 DIAGNOSIS — H4312 Vitreous hemorrhage, left eye: Secondary | ICD-10-CM

## 2019-01-24 DIAGNOSIS — H35033 Hypertensive retinopathy, bilateral: Secondary | ICD-10-CM

## 2019-01-24 DIAGNOSIS — H3581 Retinal edema: Secondary | ICD-10-CM

## 2019-01-24 DIAGNOSIS — E113313 Type 2 diabetes mellitus with moderate nonproliferative diabetic retinopathy with macular edema, bilateral: Secondary | ICD-10-CM | POA: Diagnosis not present

## 2019-01-24 DIAGNOSIS — Z961 Presence of intraocular lens: Secondary | ICD-10-CM

## 2019-01-24 MED ORDER — BEVACIZUMAB CHEMO INJECTION 1.25MG/0.05ML SYRINGE FOR KALEIDOSCOPE
1.2500 mg | INTRAVITREAL | Status: AC | PRN
  Administered 2019-01-24: 1.25 mg via INTRAVITREAL

## 2019-02-19 NOTE — Progress Notes (Addendum)
[facility] Note  02/21/2019     CHIEF COMPLAINT Patient presents for Retina Follow Up   HISTORY OF PRESENT ILLNESS: Gregory Evetts. is a 73 y.o. male who presents to the clinic today for:  HPI    Retina Follow Up    Patient presents with  Other.  In left eye.  This started weeks ago.  Severity is moderate.  Duration of weeks.  Since onset it is gradually improving.  I, the attending physician,  performed the HPI with the patient and updated documentation appropriately.          Comments    Patient states his vision is improved OU.  Patient denies eye pain or discomfort and denies any new or worsening floaters or fol OU.       Last edited by Gregory Caffey, MD on 02/21/2019 10:31 AM. (History)    pt is using a pink, gray and purple cap drop in the left eye, purple cap in right eye only, he feels like his vision is doing well   Referring physician:   HISTORICAL INFORMATION:   Selected notes from the MEDICAL RECORD NUMBER Referred by ED for RD OS LEE:  Ocular Hx- PMH-DM [takes glimeperide (A1c: 5.6, 04/16/18) BS: 239 (04.10.20)]    CURRENT MEDICATIONS: Current Outpatient Medications (Ophthalmic Drugs)  Medication Sig  . brimonidine (ALPHAGAN) 0.2 % ophthalmic solution Place 1 drop into both eyes 2 (two) times daily. (Patient not taking: Reported on 01/24/2019)   No current facility-administered medications for this visit.  (Ophthalmic Drugs)   Current Outpatient Medications (Other)  Medication Sig  . amLODipine (NORVASC) 10 MG tablet Take 1 tablet (10 mg total) by mouth daily.  Marland Kitchen amLODipine (NORVASC) 5 MG tablet Take 5 mg by mouth daily.  . betamethasone dipropionate (DIPROLENE) 0.05 % cream Apply topically 2 (two) times daily.  . citalopram (CELEXA) 10 MG tablet every evening.  . COMBIVENT RESPIMAT 20-100 MCG/ACT AERS respimat Inhale 1 puff into the lungs every 6 (six) hours as needed for shortness of breath.  . ferrous sulfate 325  (65 FE) MG tablet Take 1 tablet (325 mg total) by mouth 2 (two) times daily with a meal. (Patient taking differently: Take 325 mg by mouth daily. )  . fluconazole (DIFLUCAN) 150 MG tablet Take 1 tablet (150 mg total) by mouth once a week.  Marland Kitchen FLUZONE HIGH-DOSE QUADRIVALENT 0.7 ML SUSY   . folic acid (FOLVITE) 1 MG tablet Take 1 tablet (1 mg total) by mouth daily.  Marland Kitchen gabapentin (NEURONTIN) 300 MG capsule Take 300 mg by mouth 3 (three) times daily.  Marland Kitchen gabapentin (NEURONTIN) 300 MG capsule Take 300 mg by mouth 3 (three) times daily.  Marland Kitchen glimepiride (AMARYL) 4 MG tablet Take 4 mg by mouth every evening.   Marland Kitchen glimepiride (AMARYL) 4 MG tablet Take 4 mg by mouth daily.  . Ipratropium-Albuterol (COMBIVENT RESPIMAT) 20-100 MCG/ACT AERS respimat Inhale 1 puff into the lungs every 6 (six) hours as needed for wheezing or shortness of breath.  . losartan (COZAAR) 100 MG tablet Take 100 mg by mouth every evening.   Marland Kitchen losartan (COZAAR) 50 MG tablet   . mometasone-formoterol (DULERA) 100-5 MCG/ACT AERO Inhale 2 puffs into the lungs daily.  . Multiple Vitamin (MULTIVITAMIN WITH MINERALS) TABS tablet Take 1 tablet by mouth daily.  Marland Kitchen nystatin cream (MYCOSTATIN) Apply to affected area 2 times daily  . pantoprazole (PROTONIX) 40 MG tablet Take 1 tablet (40 mg total) by  mouth daily.  . simvastatin (ZOCOR) 10 MG tablet TAKE 1 TABLET BY MOUTH EVERYDAY AT BEDTIME  . sucralfate (CARAFATE) 1 g tablet Take 1 tablet (1 g total) by mouth 4 (four) times daily.  Marland Kitchen thiamine 100 MG tablet Take 1 tablet (100 mg total) by mouth daily.  Marland Kitchen tiotropium (SPIRIVA HANDIHALER) 18 MCG inhalation capsule Place 1 capsule (18 mcg total) into inhaler and inhale daily.  . traZODone (DESYREL) 50 MG tablet Take 50 mg by mouth at bedtime as needed for sleep.   No current facility-administered medications for this visit.  (Other)      REVIEW OF SYSTEMS: ROS    Positive for: Endocrine, Eyes   Negative for: Constitutional, Gastrointestinal,  Neurological, Skin, Genitourinary, Musculoskeletal, HENT, Cardiovascular, Respiratory, Psychiatric, Allergic/Imm, Heme/Lymph   Last edited by Gregory Nunez on 02/21/2019  9:33 AM. (History)       ALLERGIES Allergies  Allergen Reactions  . Penicillins     Nervous   . Penicillins Anxiety and Other (See Comments)    DID THE REACTION INVOLVE: Swelling of the face/tongue/throat, SOB, or low BP? No Sudden or severe rash/hives, skin peeling, or the inside of the mouth or nose? No Did it require medical treatment? No When did it last happen?many years ago If all above answers are "NO", may proceed with cephalosporin use.       PAST MEDICAL HISTORY Past Medical History:  Diagnosis Date  . Cancer [facility])    prostate  . Cataract    OD  . Depression   . Diabetes mellitus without complication ([place])   . Diabetic retinopathy ([place])    NPDR OU  . GERD (gastroesophageal reflux disease)    if drinks alcohol  . 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 Hyperlipidemia   . Hypertension   . Hypertensive retinopathy    OU  . Malignant neoplasm of prostate ([place]) 01/09/2014  . Pancreatitis   . Prostate cancer ([place]) 12/19/13   Gleason 4+3=7, volume 31.31 cc  . Shortness of breath dyspnea    with exertion    Past Surgical History:  Procedure Laterality Date  . BIOPSY  04/16/2018   Procedure: BIOPSY;  Surgeon: Juanita Craver, MD;  Location: [facility] ENDOSCOPY;  Service: Endoscopy;;  . biopsy on throat     hx of   . CATARACT EXTRACTION Bilateral    Dr. Quentin Ore  . ESOPHAGOGASTRODUODENOSCOPY Left 04/16/2018   Procedure: ESOPHAGOGASTRODUODENOSCOPY (EGD);  Surgeon: Juanita Craver, MD;  Location: [facility];  Service: Endoscopy;  Laterality: Left;  . EYE SURGERY    . FOOT SURGERY    . HOT HEMOSTASIS N/A 04/16/2018   Procedure: HOT HEMOSTASIS (ARGON PLASMA COAGULATION/BICAP);  Surgeon: Juanita Craver, MD;  Location: [facility]  ENDOSCOPY;  Service: Endoscopy;  Laterality: N/A;  . LYMPHADENECTOMY Bilateral 02/27/2014   Procedure: BILATERAL LYMPHADENECTOMY;  Surgeon: Alexis Frock, MD;  Location: [facility] ORS;  Service: Urology;  Laterality: Bilateral;  . PROSTATE BIOPSY  12/2013   Gleason 4+3=7, volume 31.31 cc  . ROBOT ASSISTED LAPAROSCOPIC RADICAL PROSTATECTOMY N/A 02/27/2014   Procedure: ROBOTIC ASSISTED LAPAROSCOPIC RADICAL PROSTATECTOMY WITH INDOCYANINE GREEN DYE;  Surgeon: Alexis Frock, MD;  Location: [facility] ORS;  Service: Urology;  Laterality: N/A;    FAMILY HISTORY Family History  Problem Relation Age of Onset  . Heart disease Mother   . Cancer Sister        breast  . Colon cancer Neg Hx   .  Esophageal cancer Neg Hx   . Rectal cancer Neg Hx   . Stomach cancer Neg Hx     SOCIAL HISTORY Social History   Tobacco Use  . Smoking status: Current Every Day Smoker    Packs/day: 0.50  . Smokeless tobacco: Never Used  . Tobacco comment: smokes a couple every day or two  Substance Use Topics  . Alcohol use: Yes    Comment: Patient states no alcohol in 2 years.   . Drug use: Yes    Types: Marijuana, Cocaine    Comment: past hx approx 30 years ago          OPHTHALMIC EXAM:  Base Eye Exam    Visual Acuity (Snellen - Linear)      Right Left   Dist [place] 20/20 -2 20/50 -2   Dist ph [place]  20/40 -2       Tonometry (Tonopen, 9:36 AM)      Right Left   Pressure 15 19       Pupils      Dark Light Shape React APD   Right 3 2 Round Minimal 0   Left 3 2 Round Minimal 0       Visual Fields      Left Right    Full Full       Extraocular Movement      Right Left    Full Full       Neuro/Psych    Oriented x3: Yes   Mood/Affect: Normal       Dilation    Both eyes: 1.0% Mydriacyl, 2.5% Phenylephrine @ 9:36 AM        Slit Lamp and Fundus Exam    Slit Lamp Exam      Right Left   Lids/Lashes Dermatochalasis - upper lid Dermatochalasis - upper lid   Conjunctiva/Sclera nasal Pinguecula, Melanosis  nasal and temporal Pinguecula, Melanosis   Cornea Arcus, 1-2+ inferior Punctate epithelial erosions, well healed temporal cataract wound Arcus, Well healed temporal cataract wounds, 2+ Punctate epithelial erosions   Anterior Chamber Deep and clear, 1/2+ pigment Deep, narrow temporal angle   Iris Round and dilated, No NVI, PPM Round and dilated, No NVI, vertical PPM from 1100-0700   Lens PC IOl in good position PC IOL in good position    Vitreous Mild syneresis, Posterior vitreous detachment Vitreous syneresis with mild pigment / RBC, white VH shrinking and settling inferiorly, Posterior vitreous detachment       Fundus Exam      Right Left   Disc thin superior rim, mild pallor, Sharp rim, +cupping sharp rim, 1+ pallor, +cupping   C/D Ratio 0.6 0.6   Macula Flat, good foveal reflex, mild Epiretinal membrane, RPE mottling, scattered MA--rare, no edema Flat, Blunted foveal reflex, mild cluster of exudate and MA with edema superior to fovea--persistent, scattered MA, Epiretinal membrane, scattered DBH - mostly superior macula - improving   Vessels Vascular attenuation, mild tortuosity, Copper wiring, AV crossing changes Vascular attenuation, AV crossing changes, Tortuous, Copper wiring   Periphery Attached, scattered IRH posteriorly, blot heme superior to disc--improving Attached, scattered MA/IRH, inferior periphery obscured by old VH        Refraction    Wearing Rx      Sphere   Right None   Left None          IMAGING AND PROCEDURES  Imaging and Procedures for @TODAY @  OCT, Retina - OU - Both Eyes  Right Eye Quality was good. Central Foveal Thickness: 278. Progression has been stable. Findings include normal foveal contour, no SRF, no IRF (Irregular lamination, no DME, diffuse thinning, trace vitreous opacities - improved).   Left Eye Quality was good. Central Foveal Thickness: 283. Progression has been stable. Findings include no SRF, normal foveal contour, epiretinal  membrane, outer retinal atrophy, intraretinal fluid, intraretinal hyper-reflective material, macular pucker (Persistent mild, IRF/IRHM superior fovea; trace vitreous opacities).   Notes *Images captured and stored on drive  Diagnosis / Impression:  OD: NFP, no IRF/SRF OS: normal foveal profile, +ERM/pucker, mild DME Persistent mild, IRF/IRHM superior fovea; trace vitreous opacities  Clinical management:  See below  Abbreviations: NFP - Normal foveal profile. CME - innmacular edema. PED - pigment epithelial detachment. IRF - intraretinal fluid. SRF - subretinal fluid. EZ - ellipsoid zone. ERM - epiretinal membrane. ORA - outer retinal atrophy. ORT - outer retinal tubulation. SRHM - subretinal hyper-reflective material        Intravitreal Injection, Pharmacologic Agent - OS - Left Eye       Time Out 02/21/2019. 9:38 AM. Confirmed correct patient, procedure, site, and patient consented.   Anesthesia Topical anesthesia was used. Anesthetic medications included Lidocaine 2%, Proparacaine 0.5%.   Procedure Preparation included 5% betadine to ocular surface, eyelid speculum. A supplied needle was used.   Injection:  1.25 mg Bevacizumab (AVASTIN) SOLN   NDC: [id number], Lot: [id number] , Expiration date: 04/04/2019   Route: Intravitreal, Site: Left Eye, Waste: 0 mL  Post-op Post injection exam found visual acuity of at least counting fingers. The patient tolerated the procedure well. There were no complications. The patient received written and verbal post procedure care education.                 ASSESSMENT/PLAN:    ICD-10-CM   1. Vitreous hemorrhage of left eye (HCC)  H43.12 Intravitreal Injection, Pharmacologic Agent - OS - Left Eye    Bevacizumab (AVASTIN) SOLN 1.25 mg  2. Moderate nonproliferative diabetic retinopathy of both eyes with macular edema associated with type 2 diabetes mellitus (HCC)  04/17/2019 Intravitreal Injection, Pharmacologic Agent - OS - Left Eye     Bevacizumab (AVASTIN) SOLN 1.25 mg  3. Retinal edema  H35.81 OCT, Retina - OU - Both Eyes  4. Essential hypertension  I10   5. Hypertensive retinopathy of both eyes  H35.033   6. Pseudophakia  Z96.1   7. 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 Glaucoma suspect of both eyes  H40.003     1. Vitreous Hemorrhage OS -- improving  - onset of floaters several weeks prior to presentation and significant vision loss noted around 04.06.20 by pt history  - unclear etiology, but suspect related to history of DM and HTN  - s/p IVA OS #1 (04.10.20), #2 (05.14.20), #3 (06.11.20), #4 (07.09.20), #5 (08.10.20), # 6 (09.09.20), #7 (10.07.20)  - VH clearing centrally, turning white and settling inferiorly  - peripheral exam shows no RT/RD OS  - OCT shows persistent, mild IRF/IRHM superior fovea and vitreous opacities  - FA (05.14.20) shows vascular perfusion defects and late leaking MA, but no NV OS  - discussed findings and treatment options  - BCVA 20/40 today, improved from 20/50  - recommend IVA OS #8 (11.04.20) for vitreous hemorrhage and DME  - RBA of procedure discussed, questions answered  - informed consent obtained and signed  - see procedure note  - VH precautions reviewed -- minimize activities, keep head elevated, avoid ASA/NSAIDs/blood thinners  as able  - f/u 4 weeks, sooner prn -- DFE/OCT/ possible injection  2,3. Moderate nonproliferative diabetic retinopathy w/ DME, OU  - exam shows scattered DBH OU  - OCT without diabetic macular edema OD; OS: +DME --  persistent  - FA (5.14.20) shows late leaking MA OU, no NV OU  - treating DME with IVA as above  4,5. Hypertensive retinopathy OU  - discussed importance of tight BP control  - discussed possible contribution to [facility] OS  - monitor  6. Pseudophakia OU  - s/p CE/IOL OU -- OS (09.02.20, Dr. Kathlen Mody), OD (09.16.20) Dr. Kathlen Mody  - beautiful surgeries, doing well  - post op drops per Dr. Kathlen Mody  7,8. 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 Kathlen Mody)   Ophthalmic Meds Ordered this visit:  Meds ordered this encounter  Medications  . Bevacizumab (AVASTIN) SOLN 1.25 mg       Return in about 4 weeks (around 03/21/2019) for f/u DME / VH OS, DFE, OCT.  There are no Patient Instructions on file for this visit.   Explained the diagnoses, plan, and follow up with the patient and they expressed understanding.  Patient expressed understanding of the importance of proper follow up care.   This document serves as a record of services personally performed by Gardiner Sleeper, MD, PhD. It was created on their behalf by Roselee Nova, COMT. The creation of this record is the provider's dictation and/or activities during the visit.  Electronically signed by: Roselee Nova, COMT 02/21/19 1:21 PM   This document serves as a record of services personally performed by Gardiner Sleeper, MD, PhD. It was created on their behalf by Ernest Mallick, OA, an ophthalmic assistant. The creation of this record is the provider's dictation and/or activities during the visit.    Electronically signed by: Ernest Mallick, OA 11.04.2020 1:21 PM  Gardiner Sleeper, M.D., Ph.D. Diseases & Surgery of the Retina and [place] 02/21/19  I have reviewed the above documentation for accuracy and completeness, and I agree with the above. Gardiner Sleeper, M.D., Ph.D. 02/21/19 1:21 PM     Abbreviations: M myopia (nearsighted); A astigmatism; H hyperopia (farsighted); P presbyopia; Mrx spectacle prescription;  CTL contact lenses; OD right eye; OS left eye; OU both eyes  XT exotropia; ET esotropia; PEK punctate epithelial keratitis; PEE punctate epithelial erosions; DES dry eye syndrome; MGD meibomian gland dysfunction; ATs artificial tears; PFAT's preservative free artificial tears; [place]  nuclear sclerotic cataract; PSC posterior subcapsular cataract; ERM epi-retinal membrane; PVD posterior vitreous detachment; RD retinal detachment; DM diabetes mellitus; DR diabetic retinopathy; NPDR non-proliferative diabetic retinopathy; PDR proliferative diabetic retinopathy; CSME clinically significant macular edema; DME diabetic macular edema; dbh dot blot hemorrhages; CWS cotton wool spot; POAG primary open angle glaucoma; C/D cup-to-disc ratio; HVF humphrey visual field; GVF goldmann visual field; OCT optical coherence tomography; IOP intraocular pressure; BRVO Branch retinal vein occlusion; CRVO central retinal vein occlusion; CRAO central retinal artery occlusion; BRAO branch retinal artery occlusion; RT retinal tear; SB scleral buckle; PPV pars plana vitrectomy; VH Vitreous hemorrhage; PRP panretinal laser photocoagulation; IVK intravitreal kenalog; VMT vitreomacular traction; MH Macular hole;  NVD neovascularization of the disc; NVE neovascularization elsewhere; AREDS age related eye disease study; ARMD age related macular degeneration; POAG primary  open angle glaucoma; EBMD epithelial/anterior basement membrane dystrophy; ACIOL anterior chamber intraocular lens; IOL intraocular lens; PCIOL posterior chamber intraocular lens; Phaco/IOL phacoemulsification with intraocular lens placement; PRK photorefractive keratectomy; LASIK laser assisted in situ keratomileusis; HTN hypertension; DM diabetes mellitus; COPD chronic obstructive pulmonary disease  

## 2019-02-21 ENCOUNTER — Ambulatory Visit (INDEPENDENT_AMBULATORY_CARE_PROVIDER_SITE_OTHER): Payer: Medicare HMO | Admitting: Ophthalmology

## 2019-02-21 ENCOUNTER — Encounter (INDEPENDENT_AMBULATORY_CARE_PROVIDER_SITE_OTHER): Payer: Self-pay | Admitting: Ophthalmology

## 2019-02-21 DIAGNOSIS — H40053 Ocular hypertension, bilateral: Secondary | ICD-10-CM

## 2019-02-21 DIAGNOSIS — Z961 Presence of intraocular lens: Secondary | ICD-10-CM

## 2019-02-21 DIAGNOSIS — E113313 Type 2 diabetes mellitus with moderate nonproliferative diabetic retinopathy with macular edema, bilateral: Secondary | ICD-10-CM | POA: Diagnosis not present

## 2019-02-21 DIAGNOSIS — H3581 Retinal edema: Secondary | ICD-10-CM | POA: Diagnosis not present

## 2019-02-21 DIAGNOSIS — I10 Essential (primary) hypertension: Secondary | ICD-10-CM | POA: Diagnosis not present

## 2019-02-21 DIAGNOSIS — H4312 Vitreous hemorrhage, left eye: Secondary | ICD-10-CM

## 2019-02-21 DIAGNOSIS — H40003 Preglaucoma, unspecified, bilateral: Secondary | ICD-10-CM

## 2019-02-21 DIAGNOSIS — H35033 Hypertensive retinopathy, bilateral: Secondary | ICD-10-CM

## 2019-02-21 MED ORDER — BEVACIZUMAB CHEMO INJECTION 1.25MG/0.05ML SYRINGE FOR KALEIDOSCOPE
1.2500 mg | INTRAVITREAL | Status: AC | PRN
  Administered 2019-02-21: 1.25 mg via INTRAVITREAL

## 2019-03-20 NOTE — Progress Notes (Addendum)
[facility] Note  03/21/2019     CHIEF COMPLAINT Patient presents for Retina Follow Up   HISTORY OF PRESENT ILLNESS: Gregory Tatom. is a 73 y.o. male who presents to the clinic today for:  HPI    Retina Follow Up    Patient presents with  Other (Vit heme).  In left eye.  Severity is moderate.  Duration of 4 weeks.  Since onset it is gradually improving.  I, the attending physician,  performed the HPI with the patient and updated documentation appropriately.          Comments    Patient states vision improving OS. Doesn't check BS because he doesn't have a meter at home. Last a1c unknown. Last BS was high when checked at doctor's office, so his doctor added metformin 500 mg bid in addition to the medication he already takes for diabetes. Has been on metformin for the past 2 weeks. Using brimonidine bid OU, prednisolone acetate bid OS.        Last edited by Bernarda Caffey, MD on 03/21/2019 10:36 AM. (History)    pt states vision improving OS. Sees less floaters OS  Referring physician:   HISTORICAL INFORMATION:   Selected notes from the MEDICAL RECORD NUMBER Referred by ED for RD OS LEE:  Ocular Hx- PMH-DM [takes glimeperide (A1c: 5.6, 04/16/18) BS: 239 (04.10.20)]    CURRENT MEDICATIONS: Current Outpatient Medications (Ophthalmic Drugs)  Medication Sig  . brimonidine (ALPHAGAN) 0.2 % ophthalmic solution Place 1 drop into both eyes 2 (two) times daily.  . prednisoLONE acetate (PRED FORTE) 1 % ophthalmic suspension Place 1 drop into the left eye 2 (two) times daily.   No current facility-administered medications for this visit.  (Ophthalmic Drugs)   Current Outpatient Medications (Other)  Medication Sig  . amLODipine (NORVASC) 10 MG tablet Take 1 tablet (10 mg total) by mouth daily.  Marland Kitchen amLODipine (NORVASC) 5 MG tablet Take 5 mg by mouth daily.  . betamethasone dipropionate (DIPROLENE) 0.05 % cream Apply topically 2 (two) times daily.   . citalopram (CELEXA) 10 MG tablet every evening.  . COMBIVENT RESPIMAT 20-100 MCG/ACT AERS respimat Inhale 1 puff into the lungs every 6 (six) hours as needed for shortness of breath.  . ferrous sulfate 325 (65 FE) MG tablet Take 1 tablet (325 mg total) by mouth 2 (two) times daily with a meal. (Patient taking differently: Take 325 mg by mouth daily. )  . fluconazole (DIFLUCAN) 150 MG tablet Take 1 tablet (150 mg total) by mouth once a week.  Marland Kitchen FLUZONE HIGH-DOSE QUADRIVALENT 0.7 ML SUSY   . folic acid (FOLVITE) 1 MG tablet Take 1 tablet (1 mg total) by mouth daily.  Marland Kitchen gabapentin (NEURONTIN) 300 MG capsule Take 300 mg by mouth 3 (three) times daily.  Marland Kitchen gabapentin (NEURONTIN) 300 MG capsule Take 300 mg by mouth 3 (three) times daily.  Marland Kitchen glimepiride (AMARYL) 4 MG tablet Take 4 mg by mouth every evening.   . Ipratropium-Albuterol (COMBIVENT RESPIMAT) 20-100 MCG/ACT AERS respimat Inhale 1 puff into the lungs every 6 (six) hours as needed for wheezing or shortness of breath.  . losartan (COZAAR) 100 MG tablet Take 100 mg by mouth every evening.   . metFORMIN (GLUCOPHAGE) 500 MG tablet Take 500 mg by mouth 2 (two) times daily.  . mometasone-formoterol (DULERA) 100-5 MCG/ACT AERO Inhale 2 puffs into the lungs daily.  . Multiple Vitamin (MULTIVITAMIN WITH MINERALS) TABS tablet Take 1 tablet by  mouth daily.  Marland Kitchen nystatin cream (MYCOSTATIN) Apply to affected area 2 times daily  . pantoprazole (PROTONIX) 40 MG tablet Take 1 tablet (40 mg total) by mouth daily.  . simvastatin (ZOCOR) 10 MG tablet TAKE 1 TABLET BY MOUTH EVERYDAY AT BEDTIME  . sucralfate (CARAFATE) 1 g tablet Take 1 tablet (1 g total) by mouth 4 (four) times daily.  Marland Kitchen thiamine 100 MG tablet Take 1 tablet (100 mg total) by mouth daily.  Marland Kitchen tiotropium (SPIRIVA HANDIHALER) 18 MCG inhalation capsule Place 1 capsule (18 mcg total) into inhaler and inhale daily.  . traZODone (DESYREL) 50 MG tablet Take 50 mg by mouth at bedtime as needed for sleep.   Marland Kitchen glimepiride (AMARYL) 4 MG tablet Take 4 mg by mouth daily.  Marland Kitchen losartan (COZAAR) 50 MG tablet    No current facility-administered medications for this visit.  (Other)      REVIEW OF SYSTEMS: ROS    Positive for: Endocrine, Eyes   Negative for: Constitutional, Gastrointestinal, Neurological, Skin, Genitourinary, Musculoskeletal, HENT, Cardiovascular, Respiratory, Psychiatric, Allergic/Imm, Heme/Lymph   Last edited by Roselee Nova D, COT on 03/21/2019  8:55 AM. (History)       ALLERGIES Allergies  Allergen Reactions  . Penicillins     Nervous   . Penicillins Anxiety and Other (See Comments)    DID THE REACTION INVOLVE: Swelling of the face/tongue/throat, SOB, or low BP? No Sudden or severe rash/hives, skin peeling, or the inside of the mouth or nose? No Did it require medical treatment? No When did it last happen?many years ago If all above answers are "NO", may proceed with cephalosporin use.       PAST MEDICAL HISTORY Past Medical History:  Diagnosis Date  . Cancer [facility])    prostate  . Cataract    OD  . Depression   . Diabetes mellitus without complication ([place])   . Diabetic retinopathy ([employer])    NPDR OU  . GERD (gastroesophageal reflux disease)    if drinks alcohol  . 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 Hyperlipidemia   . Hypertension   . Hypertensive retinopathy    OU  . Malignant neoplasm of prostate ([place]) 01/09/2014  . Pancreatitis   . Prostate cancer ([place]) 12/19/13   Gleason 4+3=7, volume 31.31 cc  . Shortness of breath dyspnea    with exertion    Past Surgical History:  Procedure Laterality Date  . BIOPSY  04/16/2018   Procedure: BIOPSY;  Surgeon: Juanita Craver, MD;  Location: [facility] ENDOSCOPY;  Service: Endoscopy;;  . biopsy on throat     hx of   . CATARACT EXTRACTION Bilateral    Dr. Quentin Ore  . ESOPHAGOGASTRODUODENOSCOPY Left 04/16/2018   Procedure:  ESOPHAGOGASTRODUODENOSCOPY (EGD);  Surgeon: Juanita Craver, MD;  Location: [facility];  Service: Endoscopy;  Laterality: Left;  . EYE SURGERY    . FOOT SURGERY    . HOT HEMOSTASIS N/A 04/16/2018   Procedure: HOT HEMOSTASIS (ARGON PLASMA COAGULATION/BICAP);  Surgeon: Juanita Craver, MD;  Location: [facility];  Service: Endoscopy;  Laterality: N/A;  . LYMPHADENECTOMY Bilateral 02/27/2014   Procedure: BILATERAL LYMPHADENECTOMY;  Surgeon: Alexis Frock, MD;  Location: [facility] ORS;  Service: Urology;  Laterality: Bilateral;  . PROSTATE BIOPSY  12/2013   Gleason 4+3=7, volume 31.31 cc  . ROBOT ASSISTED LAPAROSCOPIC RADICAL PROSTATECTOMY N/A 02/27/2014   Procedure: ROBOTIC ASSISTED LAPAROSCOPIC RADICAL PROSTATECTOMY WITH INDOCYANINE GREEN DYE;  Surgeon: Alexis Frock, MD;  Location: [facility] ORS;  Service: Urology;  Laterality: N/A;    FAMILY HISTORY Family History  Problem Relation Age of Onset  . Heart disease Mother   . Cancer Sister        breast  . Colon cancer Neg Hx   . Esophageal cancer Neg Hx   . Rectal cancer Neg Hx   . Stomach cancer Neg Hx     SOCIAL HISTORY Social History   Tobacco Use  . Smoking status: Current Every Day Smoker    Packs/day: 0.50  . Smokeless tobacco: Never Used  . Tobacco comment: smokes a couple every day or two  Substance Use Topics  . Alcohol use: Yes    Comment: Patient states no alcohol in 2 years.   . Drug use: Yes    Types: Marijuana, Cocaine    Comment: past hx approx 30 years ago          OPHTHALMIC EXAM:  Base Eye Exam    Visual Acuity (Snellen - Linear)      Right Left   Dist [place] 20/20 20/40 -2   Dist ph [place]  NI   Correction: Glasses       Tonometry (Tonopen, 10:42 AM)      Right Left   Pressure 18 18       Pupils      Dark Light Shape React APD   Right 3 2 Round Minimal None   Left 3 2 Round Minimal None       Visual Fields (Counting fingers)      Left Right    Full Full       Extraocular Movement      Right Left     Full, Ortho Full, Ortho       Neuro/Psych    Oriented x3: Yes   Mood/Affect: Normal       Dilation    Both eyes: 1.0% Mydriacyl, 2.5% Phenylephrine @ 9:10 AM        Slit Lamp and Fundus Exam    Slit Lamp Exam      Right Left   Lids/Lashes Dermatochalasis - upper lid Dermatochalasis - upper lid   Conjunctiva/Sclera nasal Pinguecula, Melanosis nasal and temporal Pinguecula, Melanosis   Cornea Arcus, 1-2+ inferior Punctate epithelial erosions, well healed temporal cataract wound Arcus, Well healed temporal cataract wounds, 2+ Punctate epithelial erosions   Anterior Chamber Deep and clear, 1/2+ pigment Deep, narrow temporal angle   Iris Round and dilated, No NVI, PPM Round and dilated, No NVI, vertical PPM from 1100-0700   Lens PC IOl in good position PC IOL in good position    Vitreous Mild syneresis, Posterior vitreous detachment Vitreous syneresis with mild pigment / RBC, white VH shrinking and settling inferiorly, Posterior vitreous detachment       Fundus Exam      Right Left   Disc thin superior rim, mild pallor, Sharp rim, +cupping sharp rim, 1+ pallor, +cupping   C/D Ratio 0.65 0.6   Macula Flat, good foveal reflex, mild Epiretinal membrane, RPE mottling, scattered MA--rare, no edema Flat, Blunted foveal reflex, mild cluster of exudate and MA with edema superior to fovea--improving, scattered MA, Epiretinal membrane, scattered DBH - mostly superior macula - improving   Vessels Vascular attenuation, mild tortuosity, Copper wiring, AV crossing changes Vascular attenuation, AV crossing changes, Tortuous, Copper wiring   Periphery Attached, scattered IRH posteriorly, blot heme superior to disc--improving Attached, scattered MA/IRH, inferior periphery obscured by old VH  Refraction    Manifest Refraction      Sphere Cylinder Axis Dist VA   Right       Left -1.00 +0.75 170 20/30-2          IMAGING AND PROCEDURES  Imaging and Procedures for @TODAY @  OCT, Retina - OU  - Both Eyes       Right Eye Quality was good. Central Foveal Thickness: 275. Progression has been stable. Findings include normal foveal contour, no SRF, no IRF (Irregular lamination, no DME, diffuse thinning, trace vitreous opacities - improved).   Left Eye Quality was good. Central Foveal Thickness: 271. Progression has been stable. Findings include no SRF, normal foveal contour, epiretinal membrane, outer retinal atrophy, intraretinal fluid, intraretinal hyper-reflective material, macular pucker (Persistent mild, IRF/IRHM superior fovea; trace vitreous opacities).   Notes *Images captured and stored on drive  Diagnosis / Impression:  OD: NFP, no IRF/SRF OS: normal foveal profile, +ERM/pucker, mild DME Persistent mild, IRF/IRHM superior fovea; trace vitreous opacities  Clinical management:  See below  Abbreviations: NFP - Normal foveal profile. CME - innmacular edema. PED - pigment epithelial detachment. IRF - intraretinal fluid. SRF - subretinal fluid. EZ - ellipsoid zone. ERM - epiretinal membrane. ORA - outer retinal atrophy. ORT - outer retinal tubulation. SRHM - subretinal hyper-reflective material        Intravitreal Injection, Pharmacologic Agent - OS - Left Eye       Time Out 03/21/2019. 9:05 AM. Confirmed correct patient, procedure, site, and patient consented.   Anesthesia Topical anesthesia was used. Anesthetic medications included Lidocaine 2%, Proparacaine 0.5%.   Procedure Preparation included 5% betadine to ocular surface, eyelid speculum. A 30 gauge needle was used.   Injection:  1.25 mg Bevacizumab (AVASTIN) SOLN   NDC: [id number], Lot: [id number] , Expiration date: 05/25/2019   Route: Intravitreal, Site: Left Eye, Waste: 0 mL  Post-op Post injection exam found visual acuity of at least counting fingers. The patient tolerated the procedure well. There were no complications. The patient received written and verbal post procedure care education.                  ASSESSMENT/PLAN:    ICD-10-CM   1. Vitreous hemorrhage of left eye (HCC)  H43.12 Intravitreal Injection, Pharmacologic Agent - OS - Left Eye    Bevacizumab (AVASTIN) SOLN 1.25 mg  2. Moderate nonproliferative diabetic retinopathy of both eyes with macular edema associated with type 2 diabetes mellitus (HCC)  15/08/2019 Intravitreal Injection, Pharmacologic Agent - OS - Left Eye    Bevacizumab (AVASTIN) SOLN 1.25 mg  3. Retinal edema  H35.81 OCT, Retina - OU - Both Eyes  4. Essential hypertension  I10   5. Hypertensive retinopathy of both eyes  H35.033   6. Pseudophakia  Z96.1   7. 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 Glaucoma suspect of both eyes  H40.003     1. Vitreous Hemorrhage OS -- improving  - onset of floaters several weeks prior to presentation and significant vision loss noted around 04.06.20 by pt history  - unclear etiology, but suspect related to history of DM and HTN  - s/p IVA OS #1 (04.10.20), #2 (05.14.20), #3 (06.11.20), #4 (07.09.20), #5 (08.10.20), # 6 (09.09.20), #7 (10.07.20), # 8 (11.04.20)  - VH clearing centrally, turning white and settling inferiorly  - peripheral exam shows no RT/RD OS  - OCT shows persistent, mild IRF/IRHM superior fovea (slighlty improved) and vitreous opacities  - FA (05.14.20) shows vascular perfusion defects and  late leaking MA, but no NV OS  - discussed findings and treatment options  - BCVA 20/30-2 today, improved from 20/40  - recommend IVA OS #9 (12.02.20) for vitreous hemorrhage and DME  - RBA of procedure discussed, questions answered  - informed consent obtained and signed  - see procedure note  - VH precautions reviewed -- minimize activities, keep head elevated, avoid ASA/NSAIDs/blood thinners as able  - f/u 4 weeks, sooner prn -- DFE/OCT/ possible injection  2,3. Moderate nonproliferative diabetic retinopathy w/ DME, OU  - exam shows scattered DBH OU  - OCT without diabetic macular edema OD; OS: +DME --   persistent  - FA (5.14.20) shows late leaking MA OU, no NV OU  - treating DME with IVA as above  4,5. Hypertensive retinopathy OU  - discussed importance of tight BP control  - discussed possible contribution to [facility] OS  - monitor  6. Pseudophakia OU  - s/p CE/IOL OU -- OS (09.02.20, Dr. Kathlen Mody), OD (09.16.20) Dr. Kathlen Mody  - beautiful surgeries, doing well  - post op drops per Dr. Kathlen Mody  7,8. 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 Kathlen Mody)   Ophthalmic Meds Ordered this visit:  Meds ordered this encounter  Medications  . Bevacizumab (AVASTIN) SOLN 1.25 mg       Return in about 5 weeks (around 04/25/2019) for Dilated Exam, OCT, Possible Injxn.  There are no Patient Instructions on file for this visit.   Explained the diagnoses, plan, and follow up with the patient and they expressed understanding.  Patient expressed understanding of the importance of proper follow up care.   This document serves as a record of services personally performed by Gardiner Sleeper, MD, PhD. It was created on their behalf by Roselee Nova, COMT. The creation of this record is the provider's dictation and/or activities during the visit.  This document serves as a record of services personally performed by Gardiner Sleeper, MD, PhD. It was created on their behalf by Roselee Nova, COMT. The creation of this record is the provider's dictation and/or activities during the visit.  Electronically signed by: Roselee Nova, COMT 03/21/19 10:27 PM  Gardiner Sleeper, M.D., Ph.D. Diseases & Surgery of the Retina and [place] 03/21/2019   I have reviewed the above documentation for accuracy and completeness, and I agree with the above. Gardiner Sleeper, M.D., Ph.D. 03/21/19 10:27 PM   Abbreviations: M myopia (nearsighted); A astigmatism; H  hyperopia (farsighted); P presbyopia; Mrx spectacle prescription;  CTL contact lenses; OD right eye; OS left eye; OU both eyes  XT exotropia; ET esotropia; PEK punctate epithelial keratitis; PEE punctate epithelial erosions; DES dry eye syndrome; MGD meibomian gland dysfunction; ATs artificial tears; PFAT's preservative free artificial tears; [place] nuclear sclerotic cataract; PSC posterior subcapsular cataract; ERM epi-retinal membrane; PVD posterior vitreous detachment; RD retinal detachment; DM diabetes mellitus; DR diabetic retinopathy; NPDR non-proliferative diabetic retinopathy; PDR proliferative diabetic retinopathy; CSME clinically significant macular edema; DME diabetic macular edema; dbh dot blot hemorrhages; CWS cotton wool spot; POAG primary open angle glaucoma; C/D cup-to-disc ratio; HVF humphrey visual field; GVF goldmann visual field; OCT optical coherence tomography; IOP intraocular pressure; BRVO Branch retinal vein occlusion; CRVO central retinal vein occlusion; CRAO central retinal artery occlusion; BRAO branch retinal artery occlusion; RT  retinal tear; SB scleral buckle; PPV pars plana vitrectomy; VH Vitreous hemorrhage; PRP panretinal laser photocoagulation; IVK intravitreal kenalog; VMT vitreomacular traction; MH Macular hole;  NVD neovascularization of the disc; NVE neovascularization elsewhere; AREDS age related eye disease study; ARMD age related macular degeneration; POAG primary open angle glaucoma; EBMD epithelial/anterior basement membrane dystrophy; ACIOL anterior chamber intraocular lens; IOL intraocular lens; PCIOL posterior chamber intraocular lens; Phaco/IOL phacoemulsification with intraocular lens placement; PRK photorefractive keratectomy; LASIK laser assisted in situ keratomileusis; HTN hypertension; DM diabetes mellitus; COPD chronic obstructive pulmonary disease  

## 2019-03-21 ENCOUNTER — Ambulatory Visit (INDEPENDENT_AMBULATORY_CARE_PROVIDER_SITE_OTHER): Payer: Medicare HMO | Admitting: Ophthalmology

## 2019-03-21 ENCOUNTER — Encounter (INDEPENDENT_AMBULATORY_CARE_PROVIDER_SITE_OTHER): Payer: Self-pay | Admitting: Ophthalmology

## 2019-03-21 DIAGNOSIS — I10 Essential (primary) hypertension: Secondary | ICD-10-CM

## 2019-03-21 DIAGNOSIS — E113313 Type 2 diabetes mellitus with moderate nonproliferative diabetic retinopathy with macular edema, bilateral: Secondary | ICD-10-CM

## 2019-03-21 DIAGNOSIS — H3581 Retinal edema: Secondary | ICD-10-CM

## 2019-03-21 DIAGNOSIS — H40003 Preglaucoma, unspecified, bilateral: Secondary | ICD-10-CM

## 2019-03-21 DIAGNOSIS — Z961 Presence of intraocular lens: Secondary | ICD-10-CM

## 2019-03-21 DIAGNOSIS — H35033 Hypertensive retinopathy, bilateral: Secondary | ICD-10-CM

## 2019-03-21 DIAGNOSIS — H4312 Vitreous hemorrhage, left eye: Secondary | ICD-10-CM | POA: Diagnosis not present

## 2019-03-21 DIAGNOSIS — H40053 Ocular hypertension, bilateral: Secondary | ICD-10-CM

## 2019-03-21 MED ORDER — BEVACIZUMAB CHEMO INJECTION 1.25MG/0.05ML SYRINGE FOR KALEIDOSCOPE
1.2500 mg | INTRAVITREAL | Status: AC | PRN
  Administered 2019-03-21: 1.25 mg via INTRAVITREAL

## 2019-04-10 ENCOUNTER — Encounter: Payer: Self-pay | Admitting: Sports Medicine

## 2019-04-10 ENCOUNTER — Ambulatory Visit (INDEPENDENT_AMBULATORY_CARE_PROVIDER_SITE_OTHER): Payer: Medicare HMO | Admitting: Sports Medicine

## 2019-04-10 ENCOUNTER — Other Ambulatory Visit: Payer: Self-pay

## 2019-04-10 DIAGNOSIS — E1140 Type 2 diabetes mellitus with diabetic neuropathy, unspecified: Secondary | ICD-10-CM

## 2019-04-10 DIAGNOSIS — B351 Tinea unguium: Secondary | ICD-10-CM | POA: Diagnosis not present

## 2019-04-10 DIAGNOSIS — M79674 Pain in right toe(s): Secondary | ICD-10-CM | POA: Diagnosis not present

## 2019-04-10 DIAGNOSIS — L989 Disorder of the skin and subcutaneous tissue, unspecified: Secondary | ICD-10-CM

## 2019-04-10 DIAGNOSIS — L84 Corns and callosities: Secondary | ICD-10-CM | POA: Diagnosis not present

## 2019-04-10 DIAGNOSIS — M79675 Pain in left toe(s): Secondary | ICD-10-CM | POA: Diagnosis not present

## 2019-04-10 NOTE — Progress Notes (Signed)
Subjective: Gregory Nunez. is a 73 y.o. male patient with history of diabetes who returns to office today complaining of painful callus and nails while ambulating in shoes; unable to trim. His glucose reading this morning was not recorded,"Does not check"; last A1c 5.5.  No other issues noted.  Patient Active Problem List   Diagnosis Date Noted  . Unresponsive 11/08/2018  . Syncope, convulsive 11/08/2018  . Essential hypertension 11/08/2018  . Alcohol withdrawal seizure with complication, with unspecified complication ([place]) 32/95/1884  . Type 2 diabetes mellitus without complication ([place]) 16/60/6301  . Symptomatic anemia 04/15/2018  . Prostate cancer ([place]) 02/27/2014  . Malignant neoplasm of prostate ([place]) 01/09/2014  . Bunion 01/17/2013  . 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 amLODipine (NORVASC) 10 MG tablet Take 1 tablet (10 mg total) by mouth daily. 30 tablet 1  . amLODipine (NORVASC) 5 MG tablet Take 5 mg by mouth daily.    . betamethasone dipropionate (DIPROLENE) 0.05 % cream Apply topically 2 (two) times daily. 30 g 0  . brimonidine (ALPHAGAN) 0.2 % ophthalmic solution Place 1 drop into both eyes 2 (two) times daily. 10 mL 3  . citalopram (CELEXA) 10 MG tablet every evening.    . COMBIVENT RESPIMAT 20-100 MCG/ACT AERS respimat Inhale 1 puff into the lungs every 6 (six) hours as needed for shortness of breath.    . ferrous sulfate 325 (65 FE) MG tablet Take 1 tablet (325 mg total) by mouth 2 (two) times daily with a meal. (Patient taking differently: Take 325 mg by mouth daily. ) 30 tablet 3  . fluconazole (DIFLUCAN) 150 MG tablet Take 1 tablet (150 mg total) by mouth once a week. 2 tablet 0  . FLUZONE HIGH-DOSE QUADRIVALENT 0.7 ML SUSY     . folic acid (FOLVITE) 1 MG tablet Take 1 tablet (1 mg total) by mouth daily.    Marland Kitchen gabapentin (NEURONTIN) 300 MG capsule Take 300 mg by mouth  3 (three) times daily.    Marland Kitchen glimepiride (AMARYL) 4 MG tablet Take 4 mg by mouth every evening.     . Ipratropium-Albuterol (COMBIVENT RESPIMAT) 20-100 MCG/ACT AERS respimat Inhale 1 puff into the lungs every 6 (six) hours as needed for wheezing or shortness of breath. 4 g 1  . losartan (COZAAR) 100 MG tablet Take 100 mg by mouth every evening.     . metFORMIN (GLUCOPHAGE) 500 MG tablet Take 500 mg by mouth 2 (two) times daily.    . mometasone-formoterol (DULERA) 100-5 MCG/ACT AERO Inhale 2 puffs into the lungs daily. 13 g 1  . Multiple Vitamin (MULTIVITAMIN WITH MINERALS) TABS tablet Take 1 tablet by mouth daily.    Marland Kitchen nystatin cream (MYCOSTATIN) Apply to affected area 2 times daily 30 g 0  . pantoprazole (PROTONIX) 40 MG tablet Take 1 tablet (40 mg total) by mouth daily. 30 tablet 1  . prednisoLONE acetate (PRED FORTE) 1 % ophthalmic suspension Place 1 drop into the left eye 2 (two) times daily.    . simvastatin (ZOCOR) 10 MG tablet TAKE 1 TABLET BY MOUTH EVERYDAY AT BEDTIME    . sucralfate (CARAFATE) 1 g tablet Take 1 tablet (1 g total) by mouth 4 (four) times daily. 120 tablet 1  . thiamine 100 MG tablet Take 1 tablet (100 mg total) by mouth daily.    Marland Kitchen tiotropium (SPIRIVA HANDIHALER) 18 MCG inhalation capsule Place 1 capsule (18  mcg total) into inhaler and inhale daily. 30 capsule 2  . traZODone (DESYREL) 50 MG tablet Take 50 mg by mouth at bedtime as needed for sleep.     No current facility-administered medications on file prior to visit.   Allergies  Allergen Reactions  . Penicillins     Nervous   . Penicillins Anxiety and Other (See Comments)    DID THE REACTION INVOLVE: Swelling of the face/tongue/throat, SOB, or low BP? No Sudden or severe rash/hives, skin peeling, or the inside of the mouth or nose? No Did it require medical treatment? No When did it last happen?many years ago If all above answers are "NO", may proceed with cephalosporin use.       Recent Results  (from the past 2160 hour(s))  POCT urinalysis dip (device)     Status: Abnormal   Collection Time: 01/10/19 11:32 AM  Result Value Ref Range   Glucose, UA 500 (A) NEGATIVE mg/dL   Bilirubin Urine NEGATIVE NEGATIVE   Ketones, ur NEGATIVE NEGATIVE mg/dL   Specific Gravity, Urine 1.025 1.005 - 1.030   Hgb urine dipstick MODERATE (A) NEGATIVE   pH 6.5 5.0 - 8.0   Protein, ur >=300 (A) NEGATIVE mg/dL   Urobilinogen, UA 1.0 0.0 - 1.0 mg/dL   Nitrite NEGATIVE NEGATIVE   Leukocytes,Ua TRACE (A) NEGATIVE    Comment: Biochemical Testing Only. Please order routine urinalysis from main lab if confirmatory testing is needed.  Urine culture     Status: Abnormal   Collection Time: 01/10/19 11:43 AM   Specimen: Urine, Random  Result Value Ref Range   Specimen Description URINE, RANDOM    Special Requests      NONE Performed at [facility], [address], [place], [place] [address]    Culture >=100,000 COLONIES/mL ESCHERICHIA COLI (A)    Report Status 01/12/2019 FINAL    Organism ID, Bacteria ESCHERICHIA COLI (A)       Susceptibility   Escherichia coli - MIC*    AMPICILLIN >=32 RESISTANT Resistant     CEFAZOLIN <=4 SENSITIVE Sensitive     CEFTRIAXONE <=1 SENSITIVE Sensitive     CIPROFLOXACIN >=4 RESISTANT Resistant     GENTAMICIN <=1 SENSITIVE Sensitive     IMIPENEM <=0.25 SENSITIVE Sensitive     NITROFURANTOIN <=16 SENSITIVE Sensitive     TRIMETH/SULFA <=20 SENSITIVE Sensitive     AMPICILLIN/SULBACTAM 16 INTERMEDIATE Intermediate     PIP/TAZO <=4 SENSITIVE Sensitive     Extended ESBL NEGATIVE Sensitive     * >=100,000 COLONIES/mL ESCHERICHIA COLI    Objective: General: Patient is awake, alert, and oriented x 3 and in no acute distress.  Integument: Skin is warm, dry and supple bilateral. Nails are mildly elongated and mildly, thickened and dystrophic with subungual debris, consistent with onychomycosis, 1-5 bilateral. No signs of infection. Callus sub-met 5 bilateral and medial  second toe on right and lateral first toe on the right with no signs of infection. Remaining integument unremarkable.  Vasculature:  Dorsalis Pedis pulse 2/4 bilateral. Posterior Tibial pulse  2/4 bilateral.  Capillary fill time <3 sec 1-5 bilateral. Positive hair growth to the level of the digits. Temperature gradient within normal limits. No varicosities present bilateral. No edema present bilateral.   Neurology: The patient has diminished sensation measured with a 5.07/10g Semmes Weinstein Monofilament at all pedal sites bilateral. Vibratory sensation diminished bilateral with tuning fork. No Babinski sign present bilateral.   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 Muscular strength  5/5 in all lower extremity muscular groups bilateral without pain on range of motion . No tenderness with calf compression bilateral.  Assessment and Plan: Problem List Items Addressed This Visit    None    Visit Diagnoses    Corns and callosities    -  Primary   Benign skin lesion       Pain due to onychomycosis of toenails of both feet       Type 2 diabetes, controlled, with neuropathy ([place])         -Examined patient. -Re-educated patient on diabetic foot care, especially with  regards to the vascular, neurological and musculoskeletal systems. -Mechanically debrided nails x10 using a sterile nail nipper without incident and debrided callus 3 using sterile chisel blade without incident  -Continue with toe spacer for right foot as needed -Patient to return  in 2-2.5 months for at risk foot care -Patient advised to call the office if any problems or questions arise in the meantime.  Landis Martins, DPM

## 2019-04-25 ENCOUNTER — Encounter (INDEPENDENT_AMBULATORY_CARE_PROVIDER_SITE_OTHER): Payer: Medicare HMO | Admitting: Ophthalmology

## 2019-05-14 NOTE — Progress Notes (Signed)
[facility] Note  05/16/2019     CHIEF COMPLAINT Patient presents for Retina Follow Up   HISTORY OF PRESENT ILLNESS: Gregory Nunez. is a 74 y.o. male who presents to the clinic today for:  HPI    Retina Follow Up    In left eye.  This started 6 months ago.  Since onset it is stable.  I, the attending physician,  performed the HPI with the patient and updated documentation appropriately.          Comments    F/U Vit. Hemorrhage OS. Patient states his vision is "good", denies new visual onsets/issues.       Last edited by Bernarda Caffey, MD on 05/16/2019  3:22 PM. (History)    pt states he is doing well, he is slightly delayed to follow up today  Referring physician:   HISTORICAL INFORMATION:   Selected notes from the MEDICAL RECORD NUMBER Referred by ED for RD OS LEE:  Ocular Hx- PMH-DM [takes glimeperide (A1c: 5.6, 04/16/18) BS: 239 (04.10.20)]    CURRENT MEDICATIONS: Current Outpatient Medications (Ophthalmic Drugs)  Medication Sig  . brimonidine (ALPHAGAN) 0.2 % ophthalmic solution Place 1 drop into both eyes 2 (two) times daily.  . prednisoLONE acetate (PRED FORTE) 1 % ophthalmic suspension Place 1 drop into the left eye 2 (two) times daily.   No current facility-administered medications for this visit. (Ophthalmic Drugs)   Current Outpatient Medications (Other)  Medication Sig  . amLODipine (NORVASC) 10 MG tablet Take 1 tablet (10 mg total) by mouth daily.  Marland Kitchen amLODipine (NORVASC) 5 MG tablet Take 5 mg by mouth daily.  . betamethasone dipropionate (DIPROLENE) 0.05 % cream Apply topically 2 (two) times daily.  . citalopram (CELEXA) 10 MG tablet every evening.  . COMBIVENT RESPIMAT 20-100 MCG/ACT AERS respimat Inhale 1 puff into the lungs every 6 (six) hours as needed for shortness of breath.  . ferrous sulfate 325 (65 FE) MG tablet Take 1 tablet (325 mg total) by mouth 2 (two) times daily with a meal. (Patient taking  differently: Take 325 mg by mouth daily. )  . fluconazole (DIFLUCAN) 150 MG tablet Take 1 tablet (150 mg total) by mouth once a week.  Marland Kitchen FLUZONE HIGH-DOSE QUADRIVALENT 0.7 ML SUSY   . folic acid (FOLVITE) 1 MG tablet Take 1 tablet (1 mg total) by mouth daily.  Marland Kitchen gabapentin (NEURONTIN) 300 MG capsule Take 300 mg by mouth 3 (three) times daily.  Marland Kitchen glimepiride (AMARYL) 4 MG tablet Take 4 mg by mouth every evening.   . Ipratropium-Albuterol (COMBIVENT RESPIMAT) 20-100 MCG/ACT AERS respimat Inhale 1 puff into the lungs every 6 (six) hours as needed for wheezing or shortness of breath.  . losartan (COZAAR) 100 MG tablet Take 100 mg by mouth every evening.   . metFORMIN (GLUCOPHAGE) 500 MG tablet Take 500 mg by mouth 2 (two) times daily.  . mometasone-formoterol (DULERA) 100-5 MCG/ACT AERO Inhale 2 puffs into the lungs daily.  . Multiple Vitamin (MULTIVITAMIN WITH MINERALS) TABS tablet Take 1 tablet by mouth daily.  Marland Kitchen nystatin cream (MYCOSTATIN) Apply to affected area 2 times daily  . pantoprazole (PROTONIX) 40 MG tablet Take 1 tablet (40 mg total) by mouth daily.  . simvastatin (ZOCOR) 10 MG tablet TAKE 1 TABLET BY MOUTH EVERYDAY AT BEDTIME  . sucralfate (CARAFATE) 1 g tablet Take 1 tablet (1 g total) by mouth 4 (four) times daily.  Marland Kitchen thiamine 100 MG tablet Take 1 tablet (  100 mg total) by mouth daily.  Marland Kitchen tiotropium (SPIRIVA HANDIHALER) 18 MCG inhalation capsule Place 1 capsule (18 mcg total) into inhaler and inhale daily.  . traZODone (DESYREL) 50 MG tablet Take 50 mg by mouth at bedtime as needed for sleep.   No current facility-administered medications for this visit. (Other)      REVIEW OF SYSTEMS: ROS    Positive for: Eyes   Negative for: Constitutional, Gastrointestinal, Neurological, Skin, Genitourinary, Musculoskeletal, HENT, Endocrine, Cardiovascular, Respiratory, Psychiatric, Allergic/Imm, Heme/Lymph   Last edited by Zenovia Jordan, LPN on [id number]  [id number] PM. (History)        ALLERGIES Allergies  Allergen Reactions  . Penicillins     Nervous   . Penicillins Anxiety and Other (See Comments)    DID THE REACTION INVOLVE: Swelling of the face/tongue/throat, SOB, or low BP? No Sudden or severe rash/hives, skin peeling, or the inside of the mouth or nose? No Did it require medical treatment? No When did it last happen?many years ago If all above answers are "NO", may proceed with cephalosporin use.       PAST MEDICAL HISTORY Past Medical History:  Diagnosis Date  . Cancer [facility])    prostate  . Cataract    OD  . Depression   . Diabetes mellitus without complication ([place])   . Diabetic retinopathy ([place])    NPDR OU  . GERD (gastroesophageal reflux disease)    if drinks alcohol  . 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 Hyperlipidemia   . Hypertension   . Hypertensive retinopathy    OU  . Malignant neoplasm of prostate ([place]) 01/09/2014  . Pancreatitis   . Prostate cancer ([place]) 12/19/13   Gleason 4+3=7, volume 31.31 cc  . Shortness of breath dyspnea    with exertion    Past Surgical History:  Procedure Laterality Date  . BIOPSY  04/16/2018   Procedure: BIOPSY;  Surgeon: Juanita Craver, MD;  Location: [facility] ENDOSCOPY;  Service: Endoscopy;;  . biopsy on throat     hx of   . CATARACT EXTRACTION Bilateral    Dr. Quentin Ore  . ESOPHAGOGASTRODUODENOSCOPY Left 04/16/2018   Procedure: ESOPHAGOGASTRODUODENOSCOPY (EGD);  Surgeon: Juanita Craver, MD;  Location: [facility];  Service: Endoscopy;  Laterality: Left;  . EYE SURGERY    . FOOT SURGERY    . HOT HEMOSTASIS N/A 04/16/2018   Procedure: HOT HEMOSTASIS (ARGON PLASMA COAGULATION/BICAP);  Surgeon: Juanita Craver, MD;  Location: [facility];  Service: Endoscopy;  Laterality: N/A;  . LYMPHADENECTOMY Bilateral 02/27/2014   Procedure: BILATERAL LYMPHADENECTOMY;  Surgeon: Alexis Frock, MD;  Location: [facility] ORS;  Service:  Urology;  Laterality: Bilateral;  . PROSTATE BIOPSY  12/2013   Gleason 4+3=7, volume 31.31 cc  . ROBOT ASSISTED LAPAROSCOPIC RADICAL PROSTATECTOMY N/A 02/27/2014   Procedure: ROBOTIC ASSISTED LAPAROSCOPIC RADICAL PROSTATECTOMY WITH INDOCYANINE GREEN DYE;  Surgeon: Alexis Frock, MD;  Location: [facility] ORS;  Service: Urology;  Laterality: N/A;    FAMILY HISTORY Family History  Problem Relation Age of Onset  . Heart disease Mother   . Cancer Sister        breast  . Colon cancer Neg Hx   . Esophageal cancer Neg Hx   . Rectal cancer Neg Hx   . Stomach cancer Neg Hx     SOCIAL HISTORY Social History   Tobacco Use  . Smoking status: Current Every Day Smoker    Packs/day: 0.50  . Smokeless  tobacco: Never Used  . Tobacco comment: smokes a couple every day or two  Substance Use Topics  . Alcohol use: Yes    Comment: Patient states no alcohol in 2 years.   . Drug use: Yes    Types: Marijuana, Cocaine    Comment: past hx approx 30 years ago          OPHTHALMIC EXAM:  Base Eye Exam    Visual Acuity (Snellen - Linear)      Right Left   Dist [place] 20/20 -2 20/50 +2   Dist ph [place] NI 20/40 +1       Tonometry (Tonopen, 1:41 PM)      Right Left   Pressure 12 14       Pupils      Dark Light Shape React APD   Right 3 2 Round Brisk None   Left 3 2 Round Brisk None       Visual Fields (Counting fingers)      Left Right    Full Full       Extraocular Movement      Right Left    Full, Ortho Full, Ortho       Neuro/Psych    Oriented x3: Yes   Mood/Affect: Normal       Dilation    Both eyes: 1.0% Mydriacyl, 2.5% Phenylephrine @ 1:41 PM        Slit Lamp and Fundus Exam    Slit Lamp Exam      Right Left   Lids/Lashes Dermatochalasis - upper lid Dermatochalasis - upper lid   Conjunctiva/Sclera nasal Pinguecula, Melanosis nasal and temporal Pinguecula, Melanosis   Cornea Arcus, 1-2+ inferior Punctate epithelial erosions, well healed temporal cataract wound Arcus, Well  healed temporal cataract wounds, 2+ Punctate epithelial erosions   Anterior Chamber Deep and clear, 1/2+ pigment Deep, narrow temporal angle   Iris Round and dilated, No NVI, PPM Round and dilated, No NVI, vertical PPM from 1100-0700   Lens PC IOl in good position PC IOL in good position    Vitreous Mild syneresis, Posterior vitreous detachment Vitreous syneresis with mild pigment / RBC, old, white VH settling inferiorly, Posterior vitreous detachment       Fundus Exam      Right Left   Disc thin superior rim, mild pallor, Sharp rim, +cupping sharp rim, 1+ pallor, +cupping   C/D Ratio 0.65 0.6   Macula Flat, good foveal reflex, mild Epiretinal membrane, RPE mottling, scattered MA--rare, no edema Flat, good foveal reflex, mild cluster of exudate and MA with edema superior to fovea--improving, scattered MA, Epiretinal membrane, scattered DBH - mostly superior macula - improving, +cystic changes   Vessels Vascular attenuation, mild tortuosity, Copper wiring, AV crossing changes Vascular attenuation, AV crossing changes, Tortuous, Copper wiring   Periphery Attached, scattered IRH posteriorly, blot heme superior to disc--improving Attached, scattered MA/IRH, inferior periphery obscured by old VH          IMAGING AND PROCEDURES  Imaging and Procedures for @TODAY @  OCT, Retina - OU - Both Eyes       Right Eye Quality was good. Central Foveal Thickness: 277. Progression has been stable. Findings include normal foveal contour, no SRF, no IRF (Irregular lamination, no DME, diffuse thinning, trace vitreous opacities - improved).   Left Eye Quality was good. Central Foveal Thickness: 264. Progression has been stable. Findings include no SRF, normal foveal contour, epiretinal membrane, outer retinal atrophy, intraretinal fluid, intraretinal hyper-reflective material, macular pucker (Persistent  mild, IRF/IRHM superior fovea; trace vitreous opacities -- improved).   Notes *Images captured and  stored on drive  Diagnosis / Impression:  OD: NFP, no IRF/SRF OS: normal foveal profile; +ERM/pucker; mild DME Persistent; mild IRF/IRHM superior fovea; trace vitreous opacities  Clinical management:  See below  Abbreviations: NFP - Normal foveal profile. CME - innmacular edema. PED - pigment epithelial detachment. IRF - intraretinal fluid. SRF - subretinal fluid. EZ - ellipsoid zone. ERM - epiretinal membrane. ORA - outer retinal atrophy. ORT - outer retinal tubulation. SRHM - subretinal hyper-reflective material        Intravitreal Injection, Pharmacologic Agent - OS - Left Eye       Time Out 05/16/2019. 3:56 PM. Confirmed correct patient, procedure, site, and patient consented.   Anesthesia Topical anesthesia was used. Anesthetic medications included Lidocaine 2%, Proparacaine 0.5%.   Procedure Preparation included 5% betadine to ocular surface, eyelid speculum. A supplied (32 g) needle was used.   Injection:  1.25 mg Bevacizumab (AVASTIN) SOLN   NDC: [id number], Lot: [id number] , Expiration date: 07/19/2019   Route: Intravitreal, Site: Left Eye, Waste: 0 mL  Post-op Post injection exam found visual acuity of at least counting fingers. The patient tolerated the procedure well. There were no complications. The patient received written and verbal post procedure care education.                 ASSESSMENT/PLAN:    ICD-10-CM   1. Vitreous hemorrhage of left eye (HCC)  H43.12 Intravitreal Injection, Pharmacologic Agent - OS - Left Eye    Bevacizumab (AVASTIN) SOLN 1.25 mg  2. Moderate nonproliferative diabetic retinopathy of both eyes with macular edema associated with type 2 diabetes mellitus (HCC)  17/04/2019 Intravitreal Injection, Pharmacologic Agent - OS - Left Eye    Bevacizumab (AVASTIN) SOLN 1.25 mg  3. Retinal edema  H35.81 OCT, Retina - OU - Both Eyes  4. Essential hypertension  I10   5. Hypertensive retinopathy of both eyes  H35.033   6. 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 Glaucoma suspect of both eyes  H40.003     1. Vitreous Hemorrhage OS -- improving  - onset of floaters several weeks prior to presentation and significant vision loss noted around 04.06.20 by pt history  - unclear etiology, but suspect related to history of DM and HTN  - s/p IVA OS #1 (04.10.20), #2 (05.14.20), #3 (06.11.20), #4 (07.09.20), #5 (08.10.20), # 6 (09.09.20), #7 (10.07.20), # 8 (11.04.20), #9 (12.02.20)  - VH clearing centrally, turning white and settling inferiorly -- persistent  - peripheral exam shows no RT/RD OS  - OCT shows persistent, mild IRF/IRHM superior fovea (slighlty improved) and vitreous opacities  - FA (05.14.20) shows vascular perfusion defects and late leaking MA, but no NV OS  - discussed findings and treatment options  - BCVA stable at 20/40  - recommend IVA OS #10 (01.27.21) for vitreous hemorrhage and DME  - RBA of procedure discussed, questions answered  - informed consent obtained and signed  - see procedure note  - VH precautions reviewed -- minimize activities, keep head elevated, avoid ASA/NSAIDs/blood thinners as able  - f/u 6 weeks, sooner prn -- DFE/OCT/ possible injection  2,3. Moderate nonproliferative diabetic retinopathy w/ DME, OU  - exam shows scattered DBH OU  - OCT without diabetic macular edema OD; OS: +DME --  persistent  - FA (5.14.20) shows late leaking MA OU, no NV OU  - treating  DME with IVA as above  4,5. Hypertensive retinopathy OU  - discussed importance of tight BP control  - discussed possible contribution to [facility] OS  - monitor  6. Pseudophakia OU  - s/p CE/IOL OU -- OS (09.02.20, Dr. Kathlen Mody), OD (09.16.20) Dr. Kathlen Mody  - beautiful surgeries, doing well  - monitor with Dr. Kathlen Mody  7,8. 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  Kathlen Mody)   Ophthalmic Meds Ordered this visit:  Meds ordered this encounter  Medications  . Bevacizumab (AVASTIN) SOLN 1.25 mg       Return in about 6 weeks (around 06/27/2019) for f/ vitreous hemorrhage OS, DFE, OCT.  There are no Patient Instructions on file for this visit.   Explained the diagnoses, plan, and follow up with the patient and they expressed understanding.  Patient expressed understanding of the importance of proper follow up care.   This document serves as a record of services personally performed by Gardiner Sleeper, MD, PhD. It was created on their behalf by Roselee Nova, COMT. The creation of this record is the provider's dictation and/or activities during the visit.  Electronically signed by: Roselee Nova, COMT 05/18/19 1:59 AM  Gardiner Sleeper, M.D., Ph.D. Diseases & Surgery of the Retina and [place] 05/16/2019   I have reviewed the above documentation for accuracy and completeness, and I agree with the above. Gardiner Sleeper, M.D., Ph.D. 05/18/19 1:59 AM    Abbreviations: M myopia (nearsighted); A astigmatism; H hyperopia (farsighted); P presbyopia; Mrx spectacle prescription;  CTL contact lenses; OD right eye; OS left eye; OU both eyes  XT exotropia; ET esotropia; PEK punctate epithelial keratitis; PEE punctate epithelial erosions; DES dry eye syndrome; MGD meibomian gland dysfunction; ATs artificial tears; PFAT's preservative free artificial tears; [place] nuclear sclerotic cataract; PSC posterior subcapsular cataract; ERM epi-retinal membrane; PVD posterior vitreous detachment; RD retinal detachment; DM diabetes mellitus; DR diabetic retinopathy; NPDR non-proliferative diabetic retinopathy; PDR proliferative diabetic retinopathy; CSME clinically significant macular edema; DME diabetic macular edema; dbh dot blot hemorrhages; CWS cotton wool spot; POAG primary open angle glaucoma; C/D cup-to-disc ratio; HVF humphrey visual field;  GVF goldmann visual field; OCT optical coherence tomography; IOP intraocular pressure; BRVO Branch retinal vein occlusion; CRVO central retinal vein occlusion; CRAO central retinal artery occlusion; BRAO branch retinal artery occlusion; RT retinal tear; SB scleral buckle; PPV pars plana vitrectomy; VH Vitreous hemorrhage; PRP panretinal laser photocoagulation; IVK intravitreal kenalog; VMT vitreomacular traction; MH Macular hole;  NVD neovascularization of the disc; NVE neovascularization elsewhere; AREDS age related eye disease study; ARMD age related macular degeneration; POAG primary open angle glaucoma; EBMD epithelial/anterior basement membrane dystrophy; ACIOL anterior chamber intraocular lens; IOL intraocular lens; PCIOL posterior chamber intraocular lens; Phaco/IOL phacoemulsification with intraocular lens placement; [place] photorefractive keratectomy; LASIK laser assisted in situ keratomileusis; HTN hypertension; DM diabetes mellitus; COPD chronic obstructive pulmonary disease

## 2019-05-16 ENCOUNTER — Ambulatory Visit (INDEPENDENT_AMBULATORY_CARE_PROVIDER_SITE_OTHER): Payer: Medicare HMO | Admitting: Ophthalmology

## 2019-05-16 ENCOUNTER — Encounter (INDEPENDENT_AMBULATORY_CARE_PROVIDER_SITE_OTHER): Payer: Self-pay | Admitting: Ophthalmology

## 2019-05-16 DIAGNOSIS — H4312 Vitreous hemorrhage, left eye: Secondary | ICD-10-CM

## 2019-05-16 DIAGNOSIS — H40053 Ocular hypertension, bilateral: Secondary | ICD-10-CM

## 2019-05-16 DIAGNOSIS — I10 Essential (primary) hypertension: Secondary | ICD-10-CM | POA: Diagnosis not present

## 2019-05-16 DIAGNOSIS — H3581 Retinal edema: Secondary | ICD-10-CM

## 2019-05-16 DIAGNOSIS — H35033 Hypertensive retinopathy, bilateral: Secondary | ICD-10-CM

## 2019-05-16 DIAGNOSIS — E113313 Type 2 diabetes mellitus with moderate nonproliferative diabetic retinopathy with macular edema, bilateral: Secondary | ICD-10-CM

## 2019-05-16 DIAGNOSIS — Z961 Presence of intraocular lens: Secondary | ICD-10-CM

## 2019-05-16 DIAGNOSIS — H40003 Preglaucoma, unspecified, bilateral: Secondary | ICD-10-CM

## 2019-05-18 MED ORDER — BEVACIZUMAB CHEMO INJECTION 1.25MG/0.05ML SYRINGE FOR KALEIDOSCOPE
1.2500 mg | INTRAVITREAL | Status: AC | PRN
  Administered 2019-05-18: 1.25 mg via INTRAVITREAL

## 2019-06-09 ENCOUNTER — Ambulatory Visit: Payer: Medicare Other | Attending: Internal Medicine

## 2019-06-21 ENCOUNTER — Ambulatory Visit: Payer: Medicare Other | Attending: Internal Medicine

## 2019-06-21 DIAGNOSIS — Z23 Encounter for immunization: Secondary | ICD-10-CM | POA: Insufficient documentation

## 2019-06-21 NOTE — Progress Notes (Signed)
   [id number]  Name:  Gregory Nunez.    MRN: [id number] DOB: Dec 27, 1945  06/21/2019  [name] was observed post Covid-19 immunization for 15 minutes without incident. He was provided with Vaccine Information Sheet and instruction to access the V-Safe system.   [name] was instructed to call 911 with any severe reactions post vaccine: Marland Kitchen Difficulty breathing  . Swelling of face and throat  . A fast heartbeat  . A bad rash all over body  . Dizziness and weakness   Immunizations Administered    Name Date Dose VIS Date Route   Pfizer COVID-19 Vaccine 06/21/2019  8:31 AM 0.3 mL 03/30/2019 Intramuscular   Manufacturer: [place]   Lot: [id number]   [place]: [id number]

## 2019-06-22 NOTE — Progress Notes (Signed)
[facility] Note  06/27/2019     CHIEF COMPLAINT Patient presents for Retina Follow Up   HISTORY OF PRESENT ILLNESS: Gregory Nunez. is a 74 y.o. male who presents to the clinic today for:  HPI    Retina Follow Up    Patient presents with  Other.  In left eye.  This started 6 weeks ago.  Severity is moderate.  I, the attending physician,  performed the HPI with the patient and updated documentation appropriately.          Comments    Patient here for 6 weeks retina follow up for VH OS. Patient states vision doing OK. No eye pain. Eye are dry.       Last edited by Bernarda Caffey, MD on 06/27/2019  1:28 PM. (History)    pt states he is doing well, he got his first covid shot yesterday, he states he has not seen Dr. Kathlen Mody in over 2 months, he states he is not using any drops  Referring physician:   HISTORICAL INFORMATION:   Selected notes from the MEDICAL RECORD NUMBER Referred by ED for RD OS LEE:  Ocular Hx- PMH-DM [takes glimeperide (A1c: 5.6, 04/16/18) BS: 239 (04.10.20)]    CURRENT MEDICATIONS: Current Outpatient Medications (Ophthalmic Drugs)  Medication Sig  . brimonidine (ALPHAGAN) 0.2 % ophthalmic solution Place 1 drop into both eyes 2 (two) times daily.  . prednisoLONE acetate (PRED FORTE) 1 % ophthalmic suspension Place 1 drop into the left eye 2 (two) times daily.   No current facility-administered medications for this visit. (Ophthalmic Drugs)   Current Outpatient Medications (Other)  Medication Sig  . amLODipine (NORVASC) 10 MG tablet Take 1 tablet (10 mg total) by mouth daily.  Marland Kitchen amLODipine (NORVASC) 5 MG tablet Take 5 mg by mouth daily.  . betamethasone dipropionate (DIPROLENE) 0.05 % cream Apply topically 2 (two) times daily.  . citalopram (CELEXA) 10 MG tablet every evening.  . COMBIVENT RESPIMAT 20-100 MCG/ACT AERS respimat Inhale 1 puff into the lungs every 6 (six) hours as needed for shortness of breath.  .  ferrous sulfate 325 (65 FE) MG tablet Take 1 tablet (325 mg total) by mouth 2 (two) times daily with a meal. (Patient taking differently: Take 325 mg by mouth daily. )  . fluconazole (DIFLUCAN) 150 MG tablet Take 1 tablet (150 mg total) by mouth once a week.  Marland Kitchen FLUZONE HIGH-DOSE QUADRIVALENT 0.7 ML SUSY   . folic acid (FOLVITE) 1 MG tablet Take 1 tablet (1 mg total) by mouth daily.  Marland Kitchen gabapentin (NEURONTIN) 300 MG capsule Take 300 mg by mouth 3 (three) times daily.  Marland Kitchen glimepiride (AMARYL) 4 MG tablet Take 4 mg by mouth every evening.   . Ipratropium-Albuterol (COMBIVENT RESPIMAT) 20-100 MCG/ACT AERS respimat Inhale 1 puff into the lungs every 6 (six) hours as needed for wheezing or shortness of breath.  . losartan (COZAAR) 100 MG tablet Take 100 mg by mouth every evening.   . metFORMIN (GLUCOPHAGE) 500 MG tablet Take 500 mg by mouth 2 (two) times daily.  . mometasone-formoterol (DULERA) 100-5 MCG/ACT AERO Inhale 2 puffs into the lungs daily.  . Multiple Vitamin (MULTIVITAMIN WITH MINERALS) TABS tablet Take 1 tablet by mouth daily.  Marland Kitchen nystatin cream (MYCOSTATIN) Apply to affected area 2 times daily  . pantoprazole (PROTONIX) 40 MG tablet Take 1 tablet (40 mg total) by mouth daily.  . simvastatin (ZOCOR) 10 MG tablet TAKE 1 TABLET BY MOUTH  EVERYDAY AT BEDTIME  . sucralfate (CARAFATE) 1 g tablet Take 1 tablet (1 g total) by mouth 4 (four) times daily.  Marland Kitchen thiamine 100 MG tablet Take 1 tablet (100 mg total) by mouth daily.  Marland Kitchen tiotropium (SPIRIVA HANDIHALER) 18 MCG inhalation capsule Place 1 capsule (18 mcg total) into inhaler and inhale daily.  . traZODone (DESYREL) 50 MG tablet Take 50 mg by mouth at bedtime as needed for sleep.   No current facility-administered medications for this visit. (Other)      REVIEW OF SYSTEMS: ROS    Positive for: Endocrine, Eyes   Negative for: Constitutional, Gastrointestinal, Neurological, Skin, Genitourinary, Musculoskeletal, HENT, Cardiovascular, Respiratory,  Psychiatric, Allergic/Imm, Heme/Lymph   Last edited by Theodore Demark, COA on 06/27/2019  1:11 PM. (History)       ALLERGIES Allergies  Allergen Reactions  . Penicillins     Nervous   . Penicillins Anxiety and Other (See Comments)    DID THE REACTION INVOLVE: Swelling of the face/tongue/throat, SOB, or low BP? No Sudden or severe rash/hives, skin peeling, or the inside of the mouth or nose? No Did it require medical treatment? No When did it last happen?many years ago If all above answers are "NO", may proceed with cephalosporin use.       PAST MEDICAL HISTORY Past Medical History:  Diagnosis Date  . Cancer [facility])    prostate  . Cataract    OD  . Depression   . Diabetes mellitus without complication ([place])   . Diabetic retinopathy ([place])    NPDR OU  . GERD (gastroesophageal reflux disease)    if drinks alcohol  . 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 Hyperlipidemia   . Hypertension   . Hypertensive retinopathy    OU  . Malignant neoplasm of prostate ([place]) 01/09/2014  . Pancreatitis   . Prostate cancer ([place]) 12/19/13   Gleason 4+3=7, volume 31.31 cc  . Shortness of breath dyspnea    with exertion    Past Surgical History:  Procedure Laterality Date  . BIOPSY  04/16/2018   Procedure: BIOPSY;  Surgeon: Juanita Craver, MD;  Location: [facility] ENDOSCOPY;  Service: Endoscopy;;  . biopsy on throat     hx of   . CATARACT EXTRACTION Bilateral    Dr. Quentin Ore  . ESOPHAGOGASTRODUODENOSCOPY Left 04/16/2018   Procedure: ESOPHAGOGASTRODUODENOSCOPY (EGD);  Surgeon: Juanita Craver, MD;  Location: [facility];  Service: Endoscopy;  Laterality: Left;  . EYE SURGERY    . FOOT SURGERY    . HOT HEMOSTASIS N/A 04/16/2018   Procedure: HOT HEMOSTASIS (ARGON PLASMA COAGULATION/BICAP);  Surgeon: Juanita Craver, MD;  Location: [facility];  Service: Endoscopy;  Laterality: N/A;  . LYMPHADENECTOMY Bilateral  02/27/2014   Procedure: BILATERAL LYMPHADENECTOMY;  Surgeon: Alexis Frock, MD;  Location: [facility] ORS;  Service: Urology;  Laterality: Bilateral;  . PROSTATE BIOPSY  12/2013   Gleason 4+3=7, volume 31.31 cc  . ROBOT ASSISTED LAPAROSCOPIC RADICAL PROSTATECTOMY N/A 02/27/2014   Procedure: ROBOTIC ASSISTED LAPAROSCOPIC RADICAL PROSTATECTOMY WITH INDOCYANINE GREEN DYE;  Surgeon: Alexis Frock, MD;  Location: [facility];  Service: Urology;  Laterality: N/A;    FAMILY HISTORY Family History  Problem Relation Age of Onset  . Heart disease Mother   . Cancer Sister        breast  . Colon cancer Neg Hx   . Esophageal cancer Neg Hx   . Rectal cancer Neg Hx   .  Stomach cancer Neg Hx     SOCIAL HISTORY Social History   Tobacco Use  . Smoking status: Current Every Day Smoker    Packs/day: 0.50  . Smokeless tobacco: Never Used  . Tobacco comment: smokes a couple every day or two  Substance Use Topics  . Alcohol use: Yes    Comment: Patient states no alcohol in 2 years.   . Drug use: Yes    Types: Marijuana, Cocaine    Comment: past hx approx 30 years ago          OPHTHALMIC EXAM:  Base Eye Exam    Visual Acuity (Snellen - Linear)      Right Left   Dist [place] 20/20 20/50 +2   Dist ph [place]  20/40       Tonometry (Tonopen, 1:08 PM)      Right Left   Pressure 17 14       Pupils      Dark Light Shape React APD   Right 3 2 Round Brisk None   Left 3 2 Round Brisk None       Visual Fields (Counting fingers)      Left Right    Full Full       Extraocular Movement      Right Left    Full, Ortho Full, Ortho       Neuro/Psych    Oriented x3: Yes   Mood/Affect: Normal       Dilation    Both eyes: 1.0% Mydriacyl, 2.5% Phenylephrine @ 1:08 PM        Slit Lamp and Fundus Exam    Slit Lamp Exam      Right Left   Lids/Lashes Dermatochalasis - upper lid Dermatochalasis - upper lid   Conjunctiva/Sclera nasal Pinguecula, Melanosis nasal and temporal Pinguecula, Melanosis   Cornea  Arcus, 1-2+ inferior Punctate epithelial erosions, well healed temporal cataract wound Arcus, Well healed temporal cataract wounds, 2+ Punctate epithelial erosions   Anterior Chamber Deep and clear, 1/2+ pigment Deep, narrow temporal angle   Iris Round and dilated, No NVI, PPM Round and dilated, No NVI, vertical PPM from 1100-0700   Lens PC IOl in good position PC IOL in good position    Vitreous Mild syneresis, Posterior vitreous detachment Vitreous syneresis with mild pigment / RBC, old, white VH settling inferiorly, Posterior vitreous detachment       Fundus Exam      Right Left   Disc thin superior rim, mild pallor, Sharp rim, +cupping sharp rim, 1+ pallor, +cupping   C/D Ratio 0.65 0.6   Macula Flat, good foveal reflex, mild Epiretinal membrane, RPE mottling, scattered MA--rare, no edema Flat, good foveal reflex, mild cluster of exudate and MA with edema superior to fovea--improving, scattered MA/DBH, Epiretinal membrane   Vessels Vascular attenuation, mild tortuosity, Copper wiring, AV crossing changes Vascular attenuation, AV crossing changes, Tortuous, Copper wiring   Periphery Attached, scattered IRH posteriorly, blot heme superior to disc--improving Attached, scattered MA/IRH, inferior periphery obscured by old VH          IMAGING AND PROCEDURES  Imaging and Procedures for @TODAY @  Intravitreal Injection, Pharmacologic Agent - OS - Left Eye       Time Out 06/27/2019. 1:04 PM. Confirmed correct patient, procedure, site, and patient consented.   Anesthesia Topical anesthesia was used. Anesthetic medications included Lidocaine 2%, Proparacaine 0.5%.   Procedure Preparation included 5% betadine to ocular surface, eyelid speculum. A supplied needle was used.  Injection:  1.25 mg Bevacizumab (AVASTIN) SOLN   NDC: [id number], Lot: [id number] , Expiration date: 08/08/2019   Route: Intravitreal, Site: Left Eye, Waste: 0 mL  Post-op Post injection exam found visual acuity of  at least counting fingers. The patient tolerated the procedure well. There were no complications. The patient received written and verbal post procedure care education.        OCT, Retina - OU - Both Eyes       Right Eye Quality was good. Central Foveal Thickness: 273. Progression has been stable. Findings include normal foveal contour, no SRF, no IRF (Irregular lamination, no DME, diffuse thinning, trace vitreous opacities - improved).   Left Eye Quality was good. Central Foveal Thickness: 262. Progression has improved. Findings include no SRF, normal foveal contour, epiretinal membrane, outer retinal atrophy, intraretinal fluid, intraretinal hyper-reflective material, macular pucker (Mild interval improvement in IRF/IRHM superior fovea; trace, persistent vitreous opacities ).   Notes *Images captured and stored on drive  Diagnosis / Impression:  OD: NFP, no IRF/SRF OS: normal foveal profile; +ERM/pucker; Mild interval improvement in IRF/IRHM superior fovea; trace, persistent vitreous opacities    Clinical management:  See below  Abbreviations: NFP - Normal foveal profile. CME - innmacular edema. PED - pigment epithelial detachment. IRF - intraretinal fluid. SRF - subretinal fluid. EZ - ellipsoid zone. ERM - epiretinal membrane. ORA - outer retinal atrophy. ORT - outer retinal tubulation. SRHM - subretinal hyper-reflective material                 ASSESSMENT/PLAN:    ICD-10-CM   1. Vitreous hemorrhage of left eye (HCC)  H43.12 Intravitreal Injection, Pharmacologic Agent - OS - Left Eye    Bevacizumab (AVASTIN) SOLN 1.25 mg  2. Moderate nonproliferative diabetic retinopathy of both eyes with macular edema associated with type 2 diabetes mellitus (HCC)  08/21/2019 Intravitreal Injection, Pharmacologic Agent - OS - Left Eye    Bevacizumab (AVASTIN) SOLN 1.25 mg  3. Retinal edema  H35.81 OCT, Retina - OU - Both Eyes  4. Essential hypertension  I10   5. Hypertensive retinopathy  of both eyes  H35.033   6. Pseudophakia  Z96.1   7. 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 Glaucoma suspect of both eyes  H40.003     1. Vitreous Hemorrhage OS -- improving  - onset of floaters several weeks prior to presentation and significant vision loss noted around 04.06.20 by pt history  - unclear etiology, but suspect related to history of DM and HTN  - s/p IVA OS #1 (04.10.20), #2 (05.14.20), #3 (06.11.20), #4 (07.09.20), #5 (08.10.20), # 6 (09.09.20), #7 (10.07.20), # 8 (11.04.20), #9 (12.02.20), #10 (01.27.21)  - VH clearing centrally, turning white and settling inferiorly   - peripheral exam shows no RT/RD OS  - OCT shows persistent, mild IRF/IRHM superior fovea (slighlty improved) and vitreous opacities  - FA (05.14.20) shows vascular perfusion defects and late leaking MA, but no NV OS  - discussed findings and treatment options  - BCVA stable at 20/40  - recommend IVA OS #11 (03.10.21) for vitreous hemorrhage and DME  - RBA of procedure discussed, questions answered  - informed consent obtained and signed  - see procedure note  - VH precautions reviewed -- minimize activities, keep head elevated, avoid ASA/NSAIDs/blood thinners as able  - f/u 6 weeks, sooner prn -- DFE/OCT/ possible injection  2,3. Moderate nonproliferative diabetic retinopathy w/ DME, OU  - exam shows scattered DBH OU  - OCT without diabetic  macular edema OD; OS: +DME --  persistent  - FA (5.14.20) shows late leaking MA OU, no NV OU  - treating DME with IVA as above  4,5. Hypertensive retinopathy OU  - discussed importance of tight BP control  - discussed possible contribution to [facility] OS  - monitor  6. Pseudophakia OU  - s/p CE/IOL OU -- OS (09.02.20, Dr. Kathlen Mody), OD (09.16.20) Dr. Kathlen Mody  - beautiful surgeries, doing well  - monitor with Dr. Kathlen Mody  7,8. 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 Kathlen Mody)    Ophthalmic Meds Ordered this visit:  Meds ordered this encounter  Medications  . Bevacizumab (AVASTIN) SOLN 1.25 mg       Return in about 6 weeks (around 08/08/2019) for f/u NPDR OU, OS, DFE, OCT.  There are no Patient Instructions on file for this visit.   Explained the diagnoses, plan, and follow up with the patient and they expressed understanding.  Patient expressed understanding of the importance of proper follow up care.   This document serves as a record of services personally performed by Gardiner Sleeper, MD, PhD. It was created on their behalf by Ernest Mallick, OA, an ophthalmic assistant. The creation of this record is the provider's dictation and/or activities during the visit.    Electronically signed by: Ernest Mallick, OA 03.10.2021 1:45 PM  Gardiner Sleeper, M.D., Ph.D. Diseases & Surgery of the Retina and [place] 06/27/2019   I have reviewed the above documentation for accuracy and completeness, and I agree with the above. Gardiner Sleeper, M.D., Ph.D. 06/27/19 1:45 PM   Abbreviations: M myopia (nearsighted); A astigmatism; H hyperopia (farsighted); P presbyopia; Mrx spectacle prescription;  CTL contact lenses; OD right eye; OS left eye; OU both eyes  XT exotropia; ET esotropia; PEK punctate epithelial keratitis; PEE punctate epithelial erosions; DES dry eye syndrome; MGD meibomian gland dysfunction; ATs artificial tears; PFAT's preservative free artificial tears; [place] nuclear sclerotic cataract; PSC posterior subcapsular cataract; ERM epi-retinal membrane; PVD posterior vitreous detachment; RD retinal detachment; DM diabetes mellitus; DR diabetic retinopathy; NPDR non-proliferative diabetic retinopathy; PDR proliferative diabetic retinopathy; CSME clinically significant macular edema; DME diabetic macular edema; dbh dot blot hemorrhages; CWS cotton wool spot; POAG primary open angle  glaucoma; C/D cup-to-disc ratio; HVF humphrey visual field; GVF goldmann visual field; OCT optical coherence tomography; IOP intraocular pressure; BRVO Branch retinal vein occlusion; CRVO central retinal vein occlusion; CRAO central retinal artery occlusion; BRAO branch retinal artery occlusion; RT retinal tear; SB scleral buckle; PPV pars plana vitrectomy; VH Vitreous hemorrhage; PRP panretinal laser photocoagulation; IVK intravitreal kenalog; VMT vitreomacular traction; MH Macular hole;  NVD neovascularization of the disc; NVE neovascularization elsewhere; AREDS age related eye disease study; ARMD age related macular degeneration; POAG primary open angle glaucoma; EBMD epithelial/anterior basement membrane dystrophy; ACIOL anterior chamber intraocular lens; IOL intraocular lens; PCIOL posterior chamber intraocular lens; Phaco/IOL phacoemulsification with intraocular lens placement; [place] photorefractive keratectomy; LASIK laser assisted in situ keratomileusis; HTN hypertension; DM diabetes mellitus; COPD chronic obstructive pulmonary disease

## 2019-06-27 ENCOUNTER — Ambulatory Visit (INDEPENDENT_AMBULATORY_CARE_PROVIDER_SITE_OTHER): Payer: Medicare Other | Admitting: Ophthalmology

## 2019-06-27 ENCOUNTER — Encounter (INDEPENDENT_AMBULATORY_CARE_PROVIDER_SITE_OTHER): Payer: Self-pay | Admitting: Ophthalmology

## 2019-06-27 DIAGNOSIS — Z961 Presence of intraocular lens: Secondary | ICD-10-CM

## 2019-06-27 DIAGNOSIS — H40003 Preglaucoma, unspecified, bilateral: Secondary | ICD-10-CM

## 2019-06-27 DIAGNOSIS — H4312 Vitreous hemorrhage, left eye: Secondary | ICD-10-CM | POA: Diagnosis not present

## 2019-06-27 DIAGNOSIS — H35033 Hypertensive retinopathy, bilateral: Secondary | ICD-10-CM

## 2019-06-27 DIAGNOSIS — H3581 Retinal edema: Secondary | ICD-10-CM | POA: Diagnosis not present

## 2019-06-27 DIAGNOSIS — E113313 Type 2 diabetes mellitus with moderate nonproliferative diabetic retinopathy with macular edema, bilateral: Secondary | ICD-10-CM

## 2019-06-27 DIAGNOSIS — I10 Essential (primary) hypertension: Secondary | ICD-10-CM

## 2019-06-27 DIAGNOSIS — H40053 Ocular hypertension, bilateral: Secondary | ICD-10-CM

## 2019-06-27 MED ORDER — BEVACIZUMAB CHEMO INJECTION 1.25MG/0.05ML SYRINGE FOR KALEIDOSCOPE
1.2500 mg | INTRAVITREAL | Status: AC | PRN
  Administered 2019-06-27: 1.25 mg via INTRAVITREAL

## 2019-07-10 ENCOUNTER — Other Ambulatory Visit: Payer: Self-pay

## 2019-07-10 ENCOUNTER — Ambulatory Visit (INDEPENDENT_AMBULATORY_CARE_PROVIDER_SITE_OTHER): Payer: Medicare Other | Admitting: Sports Medicine

## 2019-07-10 ENCOUNTER — Encounter: Payer: Self-pay | Admitting: Sports Medicine

## 2019-07-10 VITALS — Temp 97.7°F

## 2019-07-10 DIAGNOSIS — M79675 Pain in left toe(s): Secondary | ICD-10-CM

## 2019-07-10 DIAGNOSIS — L989 Disorder of the skin and subcutaneous tissue, unspecified: Secondary | ICD-10-CM

## 2019-07-10 DIAGNOSIS — E1140 Type 2 diabetes mellitus with diabetic neuropathy, unspecified: Secondary | ICD-10-CM | POA: Diagnosis not present

## 2019-07-10 DIAGNOSIS — L84 Corns and callosities: Secondary | ICD-10-CM | POA: Diagnosis not present

## 2019-07-10 DIAGNOSIS — M79674 Pain in right toe(s): Secondary | ICD-10-CM | POA: Diagnosis not present

## 2019-07-10 DIAGNOSIS — B351 Tinea unguium: Secondary | ICD-10-CM | POA: Diagnosis not present

## 2019-07-10 NOTE — Progress Notes (Signed)
Subjective: Gregory Nunez. is a 74 y.o. male patient with history of diabetes who returns to office today complaining of painful callus and nails while ambulating in shoes; unable to trim. His glucose reading this morning was not recorded,"Does not check"; last A1c 5.6.  No other issues noted.  Patient Active Problem List   Diagnosis Date Noted  . Unresponsive 11/08/2018  . Syncope, convulsive 11/08/2018  . Essential hypertension 11/08/2018  . Alcohol withdrawal seizure with complication, with unspecified complication ([place]) 46/27/0350  . Type 2 diabetes mellitus without complication ([place]) 09/38/1829  . Symptomatic anemia 04/15/2018  . Prostate cancer ([place]) 02/27/2014  . Malignant neoplasm of prostate ([place]) 01/09/2014  . Bunion 01/17/2013  . 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 amLODipine (NORVASC) 10 MG tablet Take 1 tablet (10 mg total) by mouth daily. 30 tablet 1  . amLODipine (NORVASC) 5 MG tablet Take 5 mg by mouth daily.    . betamethasone dipropionate (DIPROLENE) 0.05 % cream Apply topically 2 (two) times daily. 30 g 0  . brimonidine (ALPHAGAN) 0.2 % ophthalmic solution Place 1 drop into both eyes 2 (two) times daily. 10 mL 3  . citalopram (CELEXA) 10 MG tablet every evening.    . COMBIVENT RESPIMAT 20-100 MCG/ACT AERS respimat Inhale 1 puff into the lungs every 6 (six) hours as needed for shortness of breath.    . fluconazole (DIFLUCAN) 150 MG tablet Take 1 tablet (150 mg total) by mouth once a week. 2 tablet 0  . FLUZONE HIGH-DOSE QUADRIVALENT 0.7 ML SUSY     . folic acid (FOLVITE) 1 MG tablet Take 1 tablet (1 mg total) by mouth daily.    Marland Kitchen gabapentin (NEURONTIN) 300 MG capsule Take 300 mg by mouth 3 (three) times daily.    Marland Kitchen glimepiride (AMARYL) 4 MG tablet Take 4 mg by mouth every evening.     . Ipratropium-Albuterol (COMBIVENT RESPIMAT) 20-100 MCG/ACT AERS respimat Inhale 1  puff into the lungs every 6 (six) hours as needed for wheezing or shortness of breath. 4 g 1  . losartan (COZAAR) 100 MG tablet Take 100 mg by mouth every evening.     . metFORMIN (GLUCOPHAGE) 500 MG tablet Take 500 mg by mouth 2 (two) times daily.    . mometasone-formoterol (DULERA) 100-5 MCG/ACT AERO Inhale 2 puffs into the lungs daily. 13 g 1  . Multiple Vitamin (MULTIVITAMIN WITH MINERALS) TABS tablet Take 1 tablet by mouth daily.    Marland Kitchen nystatin cream (MYCOSTATIN) Apply to affected area 2 times daily 30 g 0  . prednisoLONE acetate (PRED FORTE) 1 % ophthalmic suspension Place 1 drop into the left eye 2 (two) times daily.    . simvastatin (ZOCOR) 10 MG tablet TAKE 1 TABLET BY MOUTH EVERYDAY AT BEDTIME    . thiamine 100 MG tablet Take 1 tablet (100 mg total) by mouth daily.    . traZODone (DESYREL) 50 MG tablet Take 50 mg by mouth at bedtime as needed for sleep.    . ferrous sulfate 325 (65 FE) MG tablet Take 1 tablet (325 mg total) by mouth 2 (two) times daily with a meal. (Patient taking differently: Take 325 mg by mouth daily. ) 30 tablet 3  . pantoprazole (PROTONIX) 40 MG tablet Take 1 tablet (40 mg total) by mouth daily. 30 tablet 1  . sucralfate (CARAFATE) 1 g tablet Take 1 tablet (1 g total) by mouth  4 (four) times daily. 120 tablet 1  . tiotropium (SPIRIVA HANDIHALER) 18 MCG inhalation capsule Place 1 capsule (18 mcg total) into inhaler and inhale daily. 30 capsule 2   No current facility-administered medications on file prior to visit.   Allergies  Allergen Reactions  . Penicillins     Nervous   . Penicillins Anxiety and Other (See Comments)    DID THE REACTION INVOLVE: Swelling of the face/tongue/throat, SOB, or low BP? No Sudden or severe rash/hives, skin peeling, or the inside of the mouth or nose? No Did it require medical treatment? No When did it last happen?many years ago If all above answers are "NO", may proceed with cephalosporin use.       No results found  for this or any previous visit (from the past 2160 hour(s)).  Objective: General: Patient is awake, alert, and oriented x 3 and in no acute distress.  Integument: Skin is warm, dry and supple bilateral. Nails are mildly elongated and mildly, thickened and dystrophic with subungual debris, consistent with onychomycosis, 1-5 bilateral. No signs of infection. Callus sub-met 5 bilateral and medial second toe on right and lateral first toe on the right with no signs of infection. Remaining integument unremarkable.  Vasculature:  Dorsalis Pedis pulse 2/4 bilateral. Posterior Tibial pulse  2/4 bilateral.  Capillary fill time <3 sec 1-5 bilateral. Positive hair growth to the level of the digits. Temperature gradient within normal limits. No varicosities present bilateral. No edema present bilateral.   Neurology: The patient has diminished sensation measured with a 5.07/10g Semmes Weinstein Monofilament at all pedal sites bilateral. Vibratory sensation diminished bilateral with tuning fork. No Babinski sign present bilateral.   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 Muscular strength 5/5 in all lower extremity muscular groups bilateral without pain on range of motion . No tenderness with calf compression bilateral.  Assessment and Plan: Problem List Items Addressed This Visit    None    Visit Diagnoses    Pain due to onychomycosis of toenails of both feet    -  Primary   Corns and callosities       Benign skin lesion       Type 2 diabetes, controlled, with neuropathy ([place])         -Examined patient. -Re-educated patient on diabetic foot care, especially with  regards to the vascular, neurological and musculoskeletal systems. -Mechanically debrided nails x10 using a sterile nail nipper without incident and debrided callus 3 using sterile chisel blade without incident  -Continue with toe spacer for right foot as needed [employer] step diabetic shoe order form was  completed; office to contact primary care for approval / certification;  Office to arrange shoe fitting and dispensing. -Patient to return  in 2-2.5 months for at risk foot care -Patient advised to call the office if any problems or questions arise in the meantime.  Landis Martins, DPM

## 2019-07-17 ENCOUNTER — Other Ambulatory Visit: Payer: Self-pay

## 2019-07-17 ENCOUNTER — Ambulatory Visit: Payer: Medicare Other | Admitting: Orthotics

## 2019-07-17 ENCOUNTER — Ambulatory Visit: Payer: Medicare Other | Attending: Internal Medicine

## 2019-07-17 DIAGNOSIS — L84 Corns and callosities: Secondary | ICD-10-CM

## 2019-07-17 DIAGNOSIS — M7751 Other enthesopathy of right foot: Secondary | ICD-10-CM

## 2019-07-17 DIAGNOSIS — M79674 Pain in right toe(s): Secondary | ICD-10-CM

## 2019-07-17 DIAGNOSIS — Z23 Encounter for immunization: Secondary | ICD-10-CM

## 2019-07-17 DIAGNOSIS — B351 Tinea unguium: Secondary | ICD-10-CM

## 2019-07-17 DIAGNOSIS — L989 Disorder of the skin and subcutaneous tissue, unspecified: Secondary | ICD-10-CM

## 2019-07-17 NOTE — Progress Notes (Signed)

## 2019-07-17 NOTE — Progress Notes (Signed)
   [id number]  Name:  Gregory Nunez.    MRN: [id number] DOB: 07/03/1945  07/17/2019  [name] was observed post Covid-19 immunization for 15 minutes without incident. He was provided with Vaccine Information Sheet and instruction to access the V-Safe system.   [name] was instructed to call 911 with any severe reactions post vaccine: Marland Kitchen Difficulty breathing  . Swelling of face and throat  . A fast heartbeat  . A bad rash all over body  . Dizziness and weakness   Immunizations Administered    Name Date Dose VIS Date Route   Pfizer COVID-19 Vaccine 07/17/2019 10:28 AM 0.3 mL 03/30/2019 Intramuscular   Manufacturer: [place]   Lot: [id number]   [place]: [id number]

## 2019-07-30 ENCOUNTER — Ambulatory Visit: Payer: Medicare Other | Admitting: Orthotics

## 2019-08-08 ENCOUNTER — Encounter (INDEPENDENT_AMBULATORY_CARE_PROVIDER_SITE_OTHER): Payer: Medicare Other | Admitting: Ophthalmology

## 2019-08-08 DIAGNOSIS — H25813 Combined forms of age-related cataract, bilateral: Secondary | ICD-10-CM

## 2019-08-08 DIAGNOSIS — H25811 Combined forms of age-related cataract, right eye: Secondary | ICD-10-CM

## 2019-08-08 DIAGNOSIS — E113313 Type 2 diabetes mellitus with moderate nonproliferative diabetic retinopathy with macular edema, bilateral: Secondary | ICD-10-CM

## 2019-08-08 DIAGNOSIS — H40003 Preglaucoma, unspecified, bilateral: Secondary | ICD-10-CM

## 2019-08-08 DIAGNOSIS — H40053 Ocular hypertension, bilateral: Secondary | ICD-10-CM

## 2019-08-08 DIAGNOSIS — H3581 Retinal edema: Secondary | ICD-10-CM

## 2019-08-08 DIAGNOSIS — Z961 Presence of intraocular lens: Secondary | ICD-10-CM

## 2019-08-08 DIAGNOSIS — H35033 Hypertensive retinopathy, bilateral: Secondary | ICD-10-CM

## 2019-08-08 DIAGNOSIS — H4312 Vitreous hemorrhage, left eye: Secondary | ICD-10-CM

## 2019-08-08 DIAGNOSIS — I10 Essential (primary) hypertension: Secondary | ICD-10-CM

## 2019-08-08 DIAGNOSIS — E113393 Type 2 diabetes mellitus with moderate nonproliferative diabetic retinopathy without macular edema, bilateral: Secondary | ICD-10-CM

## 2019-08-19 ENCOUNTER — Encounter (HOSPITAL_COMMUNITY): Payer: Self-pay | Admitting: Emergency Medicine

## 2019-08-19 ENCOUNTER — Emergency Department (HOSPITAL_COMMUNITY): Payer: Medicare Other

## 2019-08-19 ENCOUNTER — Emergency Department (HOSPITAL_COMMUNITY)
Admission: EM | Admit: 2019-08-19 | Discharge: 2019-08-19 | Disposition: A | Discharge status: Home / Self Care | Payer: Medicare Other | Attending: Emergency Medicine | Admitting: Emergency Medicine

## 2019-08-19 ENCOUNTER — Ambulatory Visit (INDEPENDENT_AMBULATORY_CARE_PROVIDER_SITE_OTHER)
Admission: EM | Admit: 2019-08-19 | Discharge: 2019-08-19 | Disposition: A | Discharge status: Home / Self Care | Payer: Medicare Other | Source: Home / Self Care

## 2019-08-19 ENCOUNTER — Other Ambulatory Visit: Payer: Self-pay

## 2019-08-19 ENCOUNTER — Encounter (HOSPITAL_COMMUNITY): Payer: Self-pay

## 2019-08-19 DIAGNOSIS — M7989 Other specified soft tissue disorders: Secondary | ICD-10-CM

## 2019-08-19 DIAGNOSIS — Y939 Activity, unspecified: Secondary | ICD-10-CM | POA: Diagnosis not present

## 2019-08-19 DIAGNOSIS — S61411A Laceration without foreign body of right hand, initial encounter: Secondary | ICD-10-CM | POA: Diagnosis not present

## 2019-08-19 DIAGNOSIS — Z23 Encounter for immunization: Secondary | ICD-10-CM | POA: Insufficient documentation

## 2019-08-19 DIAGNOSIS — M79601 Pain in right arm: Secondary | ICD-10-CM | POA: Diagnosis not present

## 2019-08-19 DIAGNOSIS — W1830XA Fall on same level, unspecified, initial encounter: Secondary | ICD-10-CM | POA: Insufficient documentation

## 2019-08-19 DIAGNOSIS — I10 Essential (primary) hypertension: Secondary | ICD-10-CM | POA: Insufficient documentation

## 2019-08-19 DIAGNOSIS — M79641 Pain in right hand: Secondary | ICD-10-CM | POA: Diagnosis not present

## 2019-08-19 DIAGNOSIS — Z79899 Other long term (current) drug therapy: Secondary | ICD-10-CM | POA: Insufficient documentation

## 2019-08-19 DIAGNOSIS — Z7984 Long term (current) use of oral hypoglycemic drugs: Secondary | ICD-10-CM | POA: Diagnosis not present

## 2019-08-19 DIAGNOSIS — Z8546 Personal history of malignant neoplasm of prostate: Secondary | ICD-10-CM | POA: Insufficient documentation

## 2019-08-19 DIAGNOSIS — Z720 Tobacco use: Secondary | ICD-10-CM | POA: Insufficient documentation

## 2019-08-19 DIAGNOSIS — Y929 Unspecified place or not applicable: Secondary | ICD-10-CM | POA: Insufficient documentation

## 2019-08-19 DIAGNOSIS — S65311A Laceration of deep palmar arch of right hand, initial encounter: Secondary | ICD-10-CM

## 2019-08-19 DIAGNOSIS — E119 Type 2 diabetes mellitus without complications: Secondary | ICD-10-CM | POA: Insufficient documentation

## 2019-08-19 DIAGNOSIS — Y999 Unspecified external cause status: Secondary | ICD-10-CM | POA: Insufficient documentation

## 2019-08-19 DIAGNOSIS — R238 Other skin changes: Secondary | ICD-10-CM

## 2019-08-19 DIAGNOSIS — W19XXXA Unspecified fall, initial encounter: Secondary | ICD-10-CM

## 2019-08-19 MED ORDER — TETANUS-DIPHTH-ACELL PERTUSSIS 5-2.5-18.5 LF-MCG/0.5 IM SUSP
0.5000 mL | Freq: Once | INTRAMUSCULAR | Status: AC
  Administered 2019-08-19: 0.5 mL via INTRAMUSCULAR
  Filled 2019-08-19: qty 0.5

## 2019-08-19 MED ORDER — ACETAMINOPHEN 325 MG PO TABS
650.0000 mg | ORAL_TABLET | Freq: Once | ORAL | Status: AC
  Administered 2019-08-19: 650 mg via ORAL
  Filled 2019-08-19: qty 2

## 2019-08-19 MED ORDER — LOSARTAN POTASSIUM 50 MG PO TABS
100.0000 mg | ORAL_TABLET | Freq: Once | ORAL | Status: AC
  Administered 2019-08-19: 100 mg via ORAL
  Filled 2019-08-19: qty 2

## 2019-08-19 MED ORDER — CEPHALEXIN 500 MG PO CAPS
500.0000 mg | ORAL_CAPSULE | Freq: Three times a day (TID) | ORAL | 0 refills | Status: DC
Start: 2019-08-19 — End: 2019-12-03

## 2019-08-19 MED ORDER — AMLODIPINE BESYLATE 5 MG PO TABS
10.0000 mg | ORAL_TABLET | Freq: Once | ORAL | Status: AC
  Administered 2019-08-19: 10 mg via ORAL
  Filled 2019-08-19: qty 2

## 2019-08-19 NOTE — Discharge Instructions (Addendum)
You were seen in the emergency department today after a fall.  Your x-rays did not show any new fractures or dislocations.  Did show some degenerative changes throughout the right hand, please discuss this with your primary care provider in follow-up.  Your tetanus has been updated.  We have cleaned your wound and dressed this.  Please see attached wound care instructions.  We are sending you home with Keflex, antibiotic, to help prevent infection of your hand.  Please take Tylenol per over-the-counter dosing to help with any discomfort.  We have prescribed you new medication(s) today. Discuss the medications prescribed today with your pharmacist as they can have adverse effects and interactions with your other medicines including over the counter and prescribed medications. Seek medical evaluation if you start to experience new or abnormal symptoms after taking one of these medicines, seek care immediately if you start to experience difficulty breathing, feeling of your throat closing, facial swelling, or rash as these could be indications of a more serious allergic reaction  Please follow with your primary care provider within 3 days for recheck of your wound and your symptoms.  Please also have your blood pressure rechecked at the appointment as it was elevated in the ER today.  Return to the ER for new or worsening symptoms including but not limited to increased pain, redness, fever, drainage from the wound, or any other concerns.

## 2019-08-19 NOTE — ED Triage Notes (Addendum)
Last night pt fall while trying to take grocery inside home and has a laceration in the palm of his right hand with some swelling.  Pt was advise to go to the Emergency room for  Further evaluation. Pt hand was swollen and he complained of right arm pain with tingling.

## 2019-08-19 NOTE — ED Triage Notes (Signed)
Pt has approx 2- 2 1/2 inch laceration to palm of R hand with swelling.  States he tripped and fell last night while getting groceries out of car and unsure what he cut it on.  Denies LOC.  Denies neck and back pain.  Reports R shoulder pain.

## 2019-08-19 NOTE — ED Provider Notes (Signed)
[facility] EMERGENCY DEPARTMENT Provider Note   CSN: [id number] Arrival date & time: 08/19/19  1223     History Chief Complaint  Patient presents with  . Fall  . Extremity Laceration    Gregory Nunez. is a 74 y.o. male with a history of diabetes mellitus, hypertension, hyperlipidemia, prostate cancer, and anemia who presents to the emergency department from urgent care for evaluation status post mechanical fall last night.  Patient states he was carrying the groceries and accidentally tripped last night falling onto the right upper extremity.  He states that he lightly bumped his head but did not lose consciousness.  He did some assistance getting up and has been ambulatory since the injury.  He reports he is having pain to the right shoulder into the right hand with a wound to the right hand, this is worse with movement, no alleviating factors.  No intervention prior to arrival.  He denies headache, visual disturbance, numbness, weakness, syncope, seizure, vomiting, chest pain, or abdominal pain.  Unknown last tetanus vaccine.  He is not on blood thinners.  He has not taken any of his morning medications today.  HPI     Past Medical History:  Diagnosis Date  . Cancer [facility])    prostate  . Cataract    OD  . Depression   . Diabetes mellitus without complication ([place])   . Diabetic retinopathy ([place])    NPDR OU  . GERD (gastroesophageal reflux disease)    if drinks alcohol  . 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 Hyperlipidemia   . Hypertension   . Hypertensive retinopathy    OU  . Malignant neoplasm of prostate ([place]) 01/09/2014  . Pancreatitis   . Prostate cancer ([place]) 12/19/13   Gleason 4+3=7, volume 31.31 cc  . Shortness of breath dyspnea    with exertion     Patient Active Problem List   Diagnosis Date Noted  . Unresponsive 11/08/2018  . Syncope, convulsive 11/08/2018    . Essential hypertension 11/08/2018  . Alcohol withdrawal seizure with complication, with unspecified complication ([place]) [id number]  . Type 2 diabetes mellitus without complication ([place]) [id number]  . Symptomatic anemia 04/15/2018  . Prostate cancer ([place]) 02/27/2014  . Malignant neoplasm of prostate ([place]) 01/09/2014  . Bunion 01/17/2013  . 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 BIOPSY  04/16/2018   Procedure: BIOPSY;  Surgeon: Juanita Craver, MD;  Location: [facility] ENDOSCOPY;  Service: Endoscopy;;  . biopsy on throat     hx of   . CATARACT EXTRACTION Bilateral    Dr. Quentin Ore  . ESOPHAGOGASTRODUODENOSCOPY Left 04/16/2018   Procedure: ESOPHAGOGASTRODUODENOSCOPY (EGD);  Surgeon: Juanita Craver, MD;  Location: [facility];  Service: Endoscopy;  Laterality: Left;  . EYE SURGERY    . FOOT SURGERY    . HOT HEMOSTASIS N/A 04/16/2018   Procedure: HOT HEMOSTASIS (ARGON PLASMA COAGULATION/BICAP);  Surgeon: Juanita Craver, MD;  Location: [facility] ENDOSCOPY;  Service: Endoscopy;  Laterality: N/A;  . LYMPHADENECTOMY Bilateral 02/27/2014   Procedure: BILATERAL LYMPHADENECTOMY;  Surgeon: Alexis Frock, MD;  Location: [facility] ORS;  Service: Urology;  Laterality: Bilateral;  . PROSTATE BIOPSY  12/2013   Gleason 4+3=7, volume 31.31 cc  . ROBOT ASSISTED LAPAROSCOPIC RADICAL PROSTATECTOMY N/A 02/27/2014   Procedure: ROBOTIC ASSISTED LAPAROSCOPIC RADICAL PROSTATECTOMY WITH INDOCYANINE GREEN DYE;  Surgeon: Alexis Frock, MD;  Location: [facility]  ORS;  Service: Urology;  Laterality: N/A;       Family History  Problem Relation Age of Onset  . Heart disease Mother   . Cancer Sister        breast  . Colon cancer Neg Hx   . Esophageal cancer Neg Hx   . Rectal cancer Neg Hx   . Stomach cancer Neg Hx     Social History   Tobacco Use  . Smoking status: Current Every Day Smoker    Packs/day: 0.50  . Smokeless tobacco: Never Used  . Tobacco comment: smokes a  couple every day or two  Substance Use Topics  . Alcohol use: Yes    Comment: Patient states no alcohol in 2 years.   . Drug use: Yes    Types: Marijuana, Cocaine    Comment: past hx approx 30 years ago     Home Medications Prior to Admission medications   Medication Sig Start Date End Date Taking? Authorizing Provider  amLODipine (NORVASC) 10 MG tablet Take 1 tablet (10 mg total) by mouth daily. 11/10/18   Mikhail, Velta Addison, DO  amLODipine (NORVASC) 5 MG tablet Take 5 mg by mouth daily. 09/23/18   [provider]  betamethasone dipropionate (DIPROLENE) 0.05 % cream Apply topically 2 (two) times daily. 01/10/19   Wieters, Hallie C, PA-C  brimonidine (ALPHAGAN) 0.2 % ophthalmic solution Place 1 drop into both eyes 2 (two) times daily. 11/27/18   Bernarda Caffey, MD  citalopram (CELEXA) 10 MG tablet every evening. 11/23/18   [provider]  COMBIVENT RESPIMAT 20-100 MCG/ACT AERS respimat Inhale 1 puff into the lungs every 6 (six) hours as needed for shortness of breath. 06/12/18   [provider]  ferrous sulfate 325 (65 FE) MG tablet Take 1 tablet (325 mg total) by mouth 2 (two) times daily with a meal. Patient taking differently: Take 325 mg by mouth daily.  04/17/18 04/17/19  Dana Allan I, MD  fluconazole (DIFLUCAN) 150 MG tablet Take 1 tablet (150 mg total) by mouth once a week. 01/10/19   Wieters, Hallie C, PA-C  FLUZONE HIGH-DOSE QUADRIVALENT 0.7 ML SUSY  12/22/18   [provider]  folic acid (FOLVITE) 1 MG tablet Take 1 tablet (1 mg total) by mouth daily. 11/10/18   Mikhail, Velta Addison, DO  gabapentin (NEURONTIN) 300 MG capsule Take 300 mg by mouth 3 (three) times daily. 06/06/18   [provider]  glimepiride (AMARYL) 4 MG tablet Take 4 mg by mouth every evening.     [provider]  Ipratropium-Albuterol (COMBIVENT RESPIMAT) 20-100 MCG/ACT AERS respimat Inhale 1 puff into the lungs every 6 (six) hours as needed for wheezing or shortness of  breath. 04/17/18   Bonnell Public, MD  losartan (COZAAR) 100 MG tablet Take 100 mg by mouth every evening.     [provider]  metFORMIN (GLUCOPHAGE) 500 MG tablet Take 500 mg by mouth 2 (two) times daily. 02/13/19   [provider]  mometasone-formoterol (DULERA) 100-5 MCG/ACT AERO Inhale 2 puffs into the lungs daily. 04/17/18   Bonnell Public, MD  Multiple Vitamin (MULTIVITAMIN WITH MINERALS) TABS tablet Take 1 tablet by mouth daily. 11/10/18   Cristal Ford, DO  nystatin cream (MYCOSTATIN) Apply to affected area 2 times daily 01/10/19   Wieters, Hallie C, PA-C  pantoprazole (PROTONIX) 40 MG tablet Take 1 tablet (40 mg total) by mouth daily. 04/17/18 04/17/19  Dana Allan I, MD  prednisoLONE acetate (PRED FORTE) 1 % ophthalmic suspension  Place 1 drop into the left eye 2 (two) times daily. 03/18/19   [provider]  simvastatin (ZOCOR) 10 MG tablet TAKE 1 TABLET BY MOUTH EVERYDAY AT BEDTIME 11/23/18   [provider]  sucralfate (CARAFATE) 1 g tablet Take 1 tablet (1 g total) by mouth 4 (four) times daily. 04/17/18 04/17/19  Dana Allan I, MD  thiamine 100 MG tablet Take 1 tablet (100 mg total) by mouth daily. 11/13/18   Mikhail, Velta Addison, DO  tiotropium (SPIRIVA HANDIHALER) 18 MCG inhalation capsule Place 1 capsule (18 mcg total) into inhaler and inhale daily. 04/17/18 04/17/19  Dana Allan I, MD  traZODone (DESYREL) 50 MG tablet Take 50 mg by mouth at bedtime as needed for sleep.    [provider]    Allergies    Penicillins and Penicillins  Review of Systems   Review of Systems  Constitutional: Negative for chills and fever.  Eyes: Negative for visual disturbance.  Respiratory: Negative for cough and shortness of breath.   Cardiovascular: Negative for chest pain.  Gastrointestinal: Negative for abdominal pain and vomiting.  Musculoskeletal: Positive for arthralgias.  Skin: Positive for wound.  Neurological:  Negative for dizziness, seizures, syncope, weakness, numbness and headaches.  All other systems reviewed and are negative.   Physical Exam Updated Vital Signs BP (!) 173/103 (BP Location: Right Arm)   Pulse (!) 108   Temp 98.2 F (36.8 C) (Oral)   Resp 20   Ht 5\' 11"  (1.803 m)   Wt 83.5 kg   SpO2 99%   BMI 25.66 kg/m   Physical Exam Vitals and nursing note reviewed.  Constitutional:      General: He is not in acute distress.    Appearance: Normal appearance. He is well-developed. He is not ill-appearing or toxic-appearing.  HENT:     Head: Normocephalic.      Ears:     Comments: No hemotympanum.    Nose: Nose normal.     Mouth/Throat:     Pharynx: Uvula midline.  Eyes:     General:        Right eye: No discharge.        Left eye: No discharge.     Conjunctiva/sclera: Conjunctivae normal.  Neck:     Comments: No midline tenderness.  Cardiovascular:     Rate and Rhythm: Regular rhythm. Tachycardia present.     Pulses:          Radial pulses are 2+ on the right side and 2+ on the left side.  Pulmonary:     Effort: Pulmonary effort is normal. No respiratory distress.     Breath sounds: Normal breath sounds. No wheezing, rhonchi or rales.  Chest:     Chest wall: No tenderness.  Abdominal:     General: There is no distension.     Palpations: Abdomen is soft.     Tenderness: There is no abdominal tenderness. There is no guarding or rebound.  Musculoskeletal:     Cervical back: Normal range of motion and neck supple.     Comments: Upper extremities: Patient has an abrasion to the right lateral shoulder around the humeral head.  He has a 3.5 cm laceration to the palm of the right hand that is pictured below, no active bleeding, no palpable or visible foreign bodies.  Patient has intact active range of motion throughout bilateral upper extremities.  He is tender to the right humeral head as well as to the right fourth/fifth MCP joints and just  proximal to this over the  laceration.  Otherwise nontender.  No anatomical snuffbox tenderness.  He is able to flex/extend the MCP/IP joints of all digits against resistance. Back: No midline tenderness or palpable step-off Lower extremities: Intact active range of motion.  No focal bony tenderness.  Skin:    General: Skin is warm and dry.     Capillary Refill: Capillary refill takes less than 2 seconds.     Findings: No rash.  Neurological:     Mental Status: He is alert.     Comments: Alert. Clear speech.  CN III through XII grossly intact.  Sensation grossly tact bilateral upper and lower extremities.  5 out of 5 symmetric grip strength.  5-5 strength with plantar dorsiflexion bilaterally.  Patient is ambulatory.  Psychiatric:        Mood and Affect: Mood normal.        Behavior: Behavior normal.        ED Results / Procedures / Treatments   Labs (all labs ordered are listed, but only abnormal results are displayed) Labs Reviewed - No data to display  EKG None  Radiology DG Shoulder Right  Result Date: 08/19/2019 CLINICAL DATA:  Fall last night, pain in the right shoulder. EXAM: RIGHT SHOULDER - 2+ VIEW COMPARISON:  None FINDINGS: Mild spurring of the humeral head and glenoid. No appreciable fracture or malalignment. Subacromial morphology is type 2 (curved). IMPRESSION: Mild spurring of the humeral head and glenoid. Electronically Signed   By: Van Clines M.D.   On: 08/19/2019 14:25   DG Hand Complete Right  Result Date: 08/19/2019 CLINICAL DATA:  Fall, right hand pain. EXAM: RIGHT HAND - COMPLETE 3+ VIEW COMPARISON:  None. FINDINGS: Mild chronic appearing deformity in the shaft of the proximal metaphysis of the middle phalanx of the index finger, with mild notching along the radial side of the head of the middle phalanx. Appearance likely chronic posttraumatic. Mild spurring of the interphalangeal joints. Mild spurring at the first MCP joint. Small erosion or degenerative subcortical cyst along the  radial side of the head of the proximal phalanx middle finger. Subcortical cyst or geode in the triquetrum. Borderline widened scapholunate interval, scapholunate ligament tear not excluded. Unusual radial sided spur like projection from the mid scaphoid. NO ACUTE FRACTURE IDENTIFIED.: NO ACUTE FRACTURE IDENTIFIED. IMPRESSION: 1. Chronic appearing deformity in the shaft of the middle phalanx of the index finger, with mild notching along the radial side of the head of the middle phalanx. Query remote trauma versus less likely a chronic lucent cortical lesion or erosion. 2. Small erosion or degenerative subcortical cyst in the head of the proximal phalanx middle finger. 3. Subcortical cyst or geode in the triquetrum. 4. Borderline widened scapholunate interval, scapholunate ligament tear not excluded. Electronically Signed   By: Van Clines M.D.   On: 08/19/2019 14:29    Procedures Procedures (including critical care time)  Medications Ordered in ED Medications  Tdap (BOOSTRIX) injection 0.5 mL (0.5 mLs Intramuscular Given 08/19/19 1401)  amLODipine (NORVASC) tablet 10 mg (10 mg Oral Given 08/19/19 1400)  losartan (COZAAR) tablet 100 mg (100 mg Oral Given 08/19/19 1401)  acetaminophen (TYLENOL) tablet 650 mg (650 mg Oral Given 08/19/19 1401)    ED Course  I have reviewed the triage vital signs and the nursing notes.  Pertinent labs & imaging results that were available during my care of the patient were reviewed by me and considered in my medical decision making (see chart for details).  MDM Rules/Calculators/A&P                     Patient presents to the emergency department status post mechanical fall with complaints of right shoulder and hand pain with right hand laceration.  He is nontoxic, resting comfortably, his vitals are notable for elevated blood pressure and mild tachycardia, he has not had his antihypertensive medications today, low suspicion for hypertensive emergency.  Will  administer BP meds in the ED.  He does not appear to have signs of serious head, neck, back, or intrathoracic/abdominal injury.  He has no focal neurologic deficits, no midline spinal tenderness, and no chest/abdominal tenderness.  He has some mild abrasions present.  He is right hand laceration does not appear to have any visible or palpable foreign bodies.  This was cleansed with Betadine, pressure irrigated with 1 L of saline, and bandaged with antibiotic ointment.  Given injury greater than 15 hours prior to arrival feel that risks outweigh benefit of closure due to concern for infection.  Will start on Keflex prophylactically for short course.  Tetanus updated in the ER.  His x-rays have been obtained, personally reviewed and interpreted, no acute fractures or dislocations, in regards to the changes to the middle phalanx into the scapholunate region, these areas are nontender to palpation therefore do not suspect acute process.  Recommended Tylenol per over-the-counter dosing.  We discussed wound care.  Overall appears appropriate for discharge home. I discussed results, treatment plan, need for follow-up, and return precautions with the patient. Provided opportunity for questions, patient confirmed understanding and is in agreement with plan.   Findings and plan of care discussed with supervising physician Dr. Sherry Ruffing who has evaluated the patient and is in agreement.   Final Clinical Impression(s) / ED Diagnoses Final diagnoses:  Fall, initial encounter  Laceration of right hand without foreign body, initial encounter  Hypertension, unspecified type    Rx / DC Orders ED Discharge Orders         Ordered    cephALEXin (KEFLEX) 500 MG capsule  3 times daily     08/19/19 [address] [place], PA-C 08/19/19 1532    Tegeler, Gwenyth Allegra, MD 08/20/19 [phone]

## 2019-08-19 NOTE — ED Notes (Signed)
Pt discharge instructions reviewed with the patient. The patient verbalized understanding of instructions and prescriptions. Pt discharged.

## 2019-08-22 NOTE — Discharge Instructions (Addendum)
Go to the ER for further evaluation and treatment. 

## 2019-08-22 NOTE — ED Provider Notes (Signed)
[facility]   DI:[id number] 08/19/19 Arrival Time: 75  CC: LAC  SUBJECTIVE:  Gregory Nunez. is a 74 y.o. male who presents with a laceration to R hand that occurred 1 day ago.  Symptoms began after he fell taking groceries into the house.  Bleeding controlled.  Currently not on blood thinners.  Denies similar symptoms in the past.  Denies fever, chills, nausea, vomiting, redness, swelling, purulent drainage, decrease strength or sensation. Reports pain, swelling, drainage from the laceration that is white and yellow. Reports R arm swelling, tingling.    ROS: As per HPI.  All other pertinent ROS negative.     Past Medical History:  Diagnosis Date  . Cancer [facility])    prostate  . Cataract    OD  . Depression   . Diabetes mellitus without complication ([place])   . Diabetic retinopathy ([place])    NPDR OU  . GERD (gastroesophageal reflux disease)    if drinks alcohol  . 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 Hyperlipidemia   . Hypertension   . Hypertensive retinopathy    OU  . Malignant neoplasm of prostate ([place]) 01/09/2014  . Pancreatitis   . Prostate cancer ([place]) 12/19/13   Gleason 4+3=7, volume 31.31 cc  . Shortness of breath dyspnea    with exertion    Past Surgical History:  Procedure Laterality Date  . BIOPSY  04/16/2018   Procedure: BIOPSY;  Surgeon: Juanita Craver, MD;  Location: [facility] ENDOSCOPY;  Service: Endoscopy;;  . biopsy on throat     hx of   . CATARACT EXTRACTION Bilateral    Dr. Quentin Ore  . ESOPHAGOGASTRODUODENOSCOPY Left 04/16/2018   Procedure: ESOPHAGOGASTRODUODENOSCOPY (EGD);  Surgeon: Juanita Craver, MD;  Location: [facility];  Service: Endoscopy;  Laterality: Left;  . EYE SURGERY    . FOOT SURGERY    . HOT HEMOSTASIS N/A 04/16/2018   Procedure: HOT HEMOSTASIS (ARGON PLASMA COAGULATION/BICAP);  Surgeon: Juanita Craver, MD;  Location: [facility];  Service: Endoscopy;   Laterality: N/A;  . LYMPHADENECTOMY Bilateral 02/27/2014   Procedure: BILATERAL LYMPHADENECTOMY;  Surgeon: Alexis Frock, MD;  Location: [facility] ORS;  Service: Urology;  Laterality: Bilateral;  . PROSTATE BIOPSY  12/2013   Gleason 4+3=7, volume 31.31 cc  . ROBOT ASSISTED LAPAROSCOPIC RADICAL PROSTATECTOMY N/A 02/27/2014   Procedure: ROBOTIC ASSISTED LAPAROSCOPIC RADICAL PROSTATECTOMY WITH INDOCYANINE GREEN DYE;  Surgeon: Alexis Frock, MD;  Location: [facility];  Service: Urology;  Laterality: N/A;   Allergies  Allergen Reactions  . Penicillins     Nervous   . Penicillins Anxiety and Other (See Comments)    DID THE REACTION INVOLVE: Swelling of the face/tongue/throat, SOB, or low BP? No Sudden or severe rash/hives, skin peeling, or the inside of the mouth or nose? No Did it require medical treatment? No When did it last happen?many years ago If all above answers are "NO", may proceed with cephalosporin use.      No current facility-administered medications on file prior to encounter.   Current Outpatient Medications on File Prior to Encounter  Medication Sig Dispense Refill  . fluconazole (DIFLUCAN) 150 MG tablet Take 1 tablet (150 mg total) by mouth once a week. 2 tablet 0  . FLUZONE HIGH-DOSE QUADRIVALENT 0.7 ML SUSY     . gabapentin (NEURONTIN) 300 MG capsule Take 300 mg by mouth 3 (three) times daily.    Marland Kitchen glimepiride (AMARYL) 4 MG tablet Take 4  mg by mouth every evening.     Marland Kitchen losartan (COZAAR) 100 MG tablet Take 100 mg by mouth every evening.     . metFORMIN (GLUCOPHAGE) 500 MG tablet Take 500 mg by mouth 2 (two) times daily.    . pantoprazole (PROTONIX) 40 MG tablet Take 1 tablet (40 mg total) by mouth daily. 30 tablet 1  . simvastatin (ZOCOR) 10 MG tablet Take 10 mg by mouth at bedtime.     . traZODone (DESYREL) 50 MG tablet Take 50 mg by mouth at bedtime as needed for sleep.    . [DISCONTINUED] amLODipine (NORVASC) 10 MG tablet Take 1 tablet (10 mg total) by mouth daily.  (Patient not taking: Reported on 08/19/2019) 30 tablet 1  . [DISCONTINUED] COMBIVENT RESPIMAT 20-100 MCG/ACT AERS respimat Inhale 1 puff into the lungs every 6 (six) hours as needed for shortness of breath.    . [DISCONTINUED] ferrous sulfate 325 (65 FE) MG tablet Take 1 tablet (325 mg total) by mouth 2 (two) times daily with a meal. (Patient not taking: Reported on 08/19/2019) 30 tablet 3  . [DISCONTINUED] mometasone-formoterol (DULERA) 100-5 MCG/ACT AERO Inhale 2 puffs into the lungs daily. (Patient not taking: Reported on 08/19/2019) 13 g 1  . [DISCONTINUED] sucralfate (CARAFATE) 1 g tablet Take 1 tablet (1 g total) by mouth 4 (four) times daily. (Patient not taking: Reported on 08/19/2019) 120 tablet 1  . [DISCONTINUED] tiotropium (SPIRIVA HANDIHALER) 18 MCG inhalation capsule Place 1 capsule (18 mcg total) into inhaler and inhale daily. (Patient not taking: Reported on 08/19/2019) 30 capsule 2   Social History   Socioeconomic History  . Marital status: Married    Spouse name: Not on file  . Number of children: Not on file  . Years of education: Not on file  . Highest education level: Not on file  Occupational History  . Not on file  Tobacco Use  . Smoking status: Current Every Day Smoker    Packs/day: 0.50  . Smokeless tobacco: Never Used  . Tobacco comment: smokes a couple every day or two  Substance and Sexual Activity  . Alcohol use: Yes    Comment: Patient states no alcohol in 2 years.   . Drug use: Yes    Types: Marijuana, Cocaine    Comment: past hx approx 30 years ago   . Sexual activity: Not on file  Other Topics Concern  . Not on file  Social History Narrative   ** Merged History Encounter **       Social Determinants of Health   Financial Resource Strain:   . Difficulty of Paying Living Expenses:   Food Insecurity:   . Worried About Charity fundraiser in the Last Year:   . Arboriculturist in the Last Year:   Transportation Needs:   . Film/video editor (Medical):    Marland Kitchen Lack of Transportation (Non-Medical):   Physical Activity:   . Days of Exercise per Week:   . Minutes of Exercise per Session:   Stress:   . Feeling of Stress :   Social Connections:   . Frequency of Communication with Friends and Family:   . Frequency of Social Gatherings with Friends and Family:   . Attends Religious Services:   . Active Member of Clubs or Organizations:   . Attends Archivist Meetings:   Marland Kitchen Marital Status:   Intimate Partner Violence:   . Fear of Current or Ex-Partner:   . Emotionally Abused:   .  Physically Abused:   . Sexually Abused:    Family History  Problem Relation Age of Onset  . Heart disease Mother   . Cancer Sister        breast  . Colon cancer Neg Hx   . Esophageal cancer Neg Hx   . Rectal cancer Neg Hx   . Stomach cancer Neg Hx      OBJECTIVE:  Vitals:   08/19/19 1215  BP: (!) 191/90  Pulse: (!) 105  Resp: 20  Temp: 98 F (36.7 C)  TempSrc: Oral  SpO2: 98%     General appearance: alert; no distress Skin: laceration of R palmar surface of hand, about 3 cm in length. Area is draining yellow fluid. Area is tender, warm to touch, with erythema streaking up the R arm Psychological: alert and cooperative; normal mood and affect   Results for orders placed or performed during the hospital encounter of 01/10/19  Urine culture   Specimen: Urine, Random  Result Value Ref Range   Specimen Description URINE, RANDOM    Special Requests      NONE Performed at [facility], [address], [place], [place] [address]    Culture >=100,000 COLONIES/mL ESCHERICHIA COLI (A)    Report Status 01/12/2019 FINAL    Organism ID, Bacteria ESCHERICHIA COLI (A)       Susceptibility   Escherichia coli - MIC*    AMPICILLIN >=32 RESISTANT Resistant     CEFAZOLIN <=4 SENSITIVE Sensitive     CEFTRIAXONE <=1 SENSITIVE Sensitive     CIPROFLOXACIN >=4 RESISTANT Resistant     GENTAMICIN <=1 SENSITIVE Sensitive     IMIPENEM <=0.25  SENSITIVE Sensitive     NITROFURANTOIN <=16 SENSITIVE Sensitive     TRIMETH/SULFA <=20 SENSITIVE Sensitive     AMPICILLIN/SULBACTAM 16 INTERMEDIATE Intermediate     PIP/TAZO <=4 SENSITIVE Sensitive     Extended ESBL NEGATIVE Sensitive     * >=100,000 COLONIES/mL ESCHERICHIA COLI  POCT urinalysis dip (device)  Result Value Ref Range   Glucose, UA 500 (A) NEGATIVE mg/dL   Bilirubin Urine NEGATIVE NEGATIVE   Ketones, ur NEGATIVE NEGATIVE mg/dL   Specific Gravity, Urine 1.025 1.005 - 1.030   Hgb urine dipstick MODERATE (A) NEGATIVE   pH 6.5 5.0 - 8.0   Protein, ur >=300 (A) NEGATIVE mg/dL   Urobilinogen, UA 1.0 0.0 - 1.0 mg/dL   Nitrite NEGATIVE NEGATIVE   Leukocytes,Ua TRACE (A) NEGATIVE    Labs Reviewed - No data to display  No results found.    ASSESSMENT & PLAN:  1. Right hand pain   2. Right arm pain   3. Laceration of deep palmar arch of right hand, initial encounter   4. Redness and swelling of hand     No orders of the defined types were placed in this encounter.   Go to the ED for further evaluation and treatment.due to erythema and streaking up the R arm from the wound. Concern for systemic infection that may need IV antibiotic treatment.   Reviewed expectations re: course of current medical issues. Questions answered. Outlined signs and symptoms indicating need for more acute intervention. Patient verbalized understanding. After Visit Summary given.    Faustino Congress, NP 08/22/19 1036

## 2019-08-31 ENCOUNTER — Other Ambulatory Visit: Payer: Self-pay

## 2019-08-31 ENCOUNTER — Other Ambulatory Visit: Payer: Medicare Other | Admitting: Orthotics

## 2019-08-31 DIAGNOSIS — E1140 Type 2 diabetes mellitus with diabetic neuropathy, unspecified: Secondary | ICD-10-CM

## 2019-08-31 DIAGNOSIS — M2012 Hallux valgus (acquired), left foot: Secondary | ICD-10-CM

## 2019-08-31 DIAGNOSIS — M2011 Hallux valgus (acquired), right foot: Secondary | ICD-10-CM | POA: Diagnosis not present

## 2019-10-16 ENCOUNTER — Ambulatory Visit: Payer: Medicare Other | Admitting: Sports Medicine

## 2019-11-22 ENCOUNTER — Encounter: Payer: Self-pay | Admitting: Sports Medicine

## 2019-11-22 ENCOUNTER — Other Ambulatory Visit: Payer: Self-pay

## 2019-11-22 ENCOUNTER — Ambulatory Visit (INDEPENDENT_AMBULATORY_CARE_PROVIDER_SITE_OTHER): Payer: Medicare Other | Admitting: Sports Medicine

## 2019-11-22 ENCOUNTER — Other Ambulatory Visit: Payer: Self-pay | Admitting: Sports Medicine

## 2019-11-22 ENCOUNTER — Ambulatory Visit (INDEPENDENT_AMBULATORY_CARE_PROVIDER_SITE_OTHER): Payer: Medicare Other

## 2019-11-22 DIAGNOSIS — M79672 Pain in left foot: Secondary | ICD-10-CM

## 2019-11-22 DIAGNOSIS — B351 Tinea unguium: Secondary | ICD-10-CM

## 2019-11-22 DIAGNOSIS — M7750 Other enthesopathy of unspecified foot: Secondary | ICD-10-CM

## 2019-11-22 DIAGNOSIS — M779 Enthesopathy, unspecified: Secondary | ICD-10-CM | POA: Diagnosis not present

## 2019-11-22 DIAGNOSIS — M778 Other enthesopathies, not elsewhere classified: Secondary | ICD-10-CM

## 2019-11-22 DIAGNOSIS — E1140 Type 2 diabetes mellitus with diabetic neuropathy, unspecified: Secondary | ICD-10-CM | POA: Diagnosis not present

## 2019-11-22 DIAGNOSIS — M79674 Pain in right toe(s): Secondary | ICD-10-CM | POA: Diagnosis not present

## 2019-11-22 DIAGNOSIS — M79675 Pain in left toe(s): Secondary | ICD-10-CM | POA: Diagnosis not present

## 2019-11-22 DIAGNOSIS — L84 Corns and callosities: Secondary | ICD-10-CM

## 2019-11-22 MED ORDER — TRIAMCINOLONE ACETONIDE 10 MG/ML IJ SUSP
10.0000 mg | Freq: Once | INTRAMUSCULAR | Status: AC
  Administered 2019-11-22: 10 mg

## 2019-11-22 NOTE — Progress Notes (Signed)
Subjective: Gregory Nunez. is a 74 y.o. male patient with history of diabetes who returns to office today complaining of painful and nails while ambulating in shoes; unable to trim and a new pain at the top of the left foot for the last 3 months, achy pain that comes and goes 6/10 to top of the foot. His glucose reading this morning was not recorded,"Does not check"; last A1c unknown.  No other issues noted.  Patient Active Problem List   Diagnosis Date Noted  . Unresponsive 11/08/2018  . Syncope, convulsive 11/08/2018  . Essential hypertension 11/08/2018  . Alcohol withdrawal seizure with complication, with unspecified complication ([place]) 02/40/9735  . Type 2 diabetes mellitus without complication ([place]) 32/99/2426  . Symptomatic anemia 04/15/2018  . Prostate cancer ([place]) 02/27/2014  . Malignant neoplasm of prostate ([place]) 01/09/2014  . Bunion 01/17/2013  . 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 albuterol (VENTOLIN HFA) 108 (90 Base) MCG/ACT inhaler     . amLODipine (NORVASC) 10 MG tablet Take 10 mg by mouth daily.    . cephALEXin (KEFLEX) 500 MG capsule Take 1 capsule (500 mg total) by mouth 3 (three) times daily. 15 capsule 0  . fluconazole (DIFLUCAN) 150 MG tablet Take 1 tablet (150 mg total) by mouth once a week. 2 tablet 0  . FLUZONE HIGH-DOSE QUADRIVALENT 0.7 ML SUSY     . gabapentin (NEURONTIN) 300 MG capsule Take 300 mg by mouth 3 (three) times daily.    Marland Kitchen glimepiride (AMARYL) 4 MG tablet Take 4 mg by mouth every evening.     Marland Kitchen losartan (COZAAR) 100 MG tablet Take 100 mg by mouth every evening.     . metFORMIN (GLUCOPHAGE) 500 MG tablet Take 500 mg by mouth 2 (two) times daily.    . simvastatin (ZOCOR) 10 MG tablet Take 10 mg by mouth at bedtime.     . traZODone (DESYREL) 50 MG tablet Take 50 mg by mouth at bedtime as needed for sleep.    . pantoprazole (PROTONIX) 40 MG tablet  Take 1 tablet (40 mg total) by mouth daily. 30 tablet 1  . [DISCONTINUED] COMBIVENT RESPIMAT 20-100 MCG/ACT AERS respimat Inhale 1 puff into the lungs every 6 (six) hours as needed for shortness of breath.    . [DISCONTINUED] ferrous sulfate 325 (65 FE) MG tablet Take 1 tablet (325 mg total) by mouth 2 (two) times daily with a meal. (Patient not taking: Reported on 08/19/2019) 30 tablet 3  . [DISCONTINUED] mometasone-formoterol (DULERA) 100-5 MCG/ACT AERO Inhale 2 puffs into the lungs daily. (Patient not taking: Reported on 08/19/2019) 13 g 1  . [DISCONTINUED] sucralfate (CARAFATE) 1 g tablet Take 1 tablet (1 g total) by mouth 4 (four) times daily. (Patient not taking: Reported on 08/19/2019) 120 tablet 1  . [DISCONTINUED] tiotropium (SPIRIVA HANDIHALER) 18 MCG inhalation capsule Place 1 capsule (18 mcg total) into inhaler and inhale daily. (Patient not taking: Reported on 08/19/2019) 30 capsule 2   No current facility-administered medications on file prior to visit.   Allergies  Allergen Reactions  . Penicillins     Nervous   . Penicillins Anxiety and Other (See Comments)    DID THE REACTION INVOLVE: Swelling of the face/tongue/throat, SOB, or low BP? No Sudden or severe rash/hives, skin peeling, or the inside of the mouth or nose? No Did it require medical treatment? No When did it last happen?many years ago  If all above answers are "NO", may proceed with cephalosporin use.       No results found for this or any previous visit (from the past 2160 hour(s)).  Objective: General: Patient is awake, alert, and oriented x 3 and in no acute distress.  Integument: Skin is warm, dry and supple bilateral. Nails are mildly elongated and mildly, thickened and dystrophic with subungual debris, consistent with onychomycosis, 1-5 bilateral. No signs of infection. Minimal Callus sub-met 5 bilateral and medial second toe on right and lateral first toe on the right with no signs of infection. Remaining  integument unremarkable.  Vasculature:  Dorsalis Pedis pulse 2/4 bilateral. Posterior Tibial pulse  2/4 bilateral. Capillary fill time <3 sec 1-5 bilateral. Positive hair growth to the level of the digits.Temperature gradient within normal limits. No varicosities present bilateral. No edema present bilateral.   Neurology: The patient has diminished sensation measured with a 5.07/10g Semmes Weinstein Monofilament at all pedal sites bilateral.   Musculoskeletal: Pain to dorsal midfoot on left, Asymptomatic pes planus, bunion on right and bilateral hammertoes. Muscular strength 5/5 in all lower extremity muscular groups bilateral without pain on range of motion . No tenderness with calf compression bilateral.  Xrays, left foot kwire intact to 1st met, midtarsal breech supportive of planus deformity.   Assessment and Plan: Problem List Items Addressed This Visit    None    Visit Diagnoses    Pain in left foot    -  Primary   Relevant Orders   DG Foot Complete Left   Capsulitis       Tendonitis       Pain due to onychomycosis of toenails of both feet       Corns and callosities       Type 2 diabetes, controlled, with neuropathy ([place])         -Examined patient. -Re-educated patient on diabetic foot care, especially with  regards to the vascular, neurological and musculoskeletal systems. -Mechanically debrided nails x10 using a sterile nail nipper without incident -Callus minimal today, no debridement necessary -Discussed left foot pain -After oral consent and aseptic prep, injected a mixture containing 1 ml of 2%  plain lidocaine, 1 ml 0.5% plain marcaine, 0.5 ml of kenalog 10 and 0.5 ml of dexamethasone phosphate into Left midfoot at area of pain without complication. Post-injection care discussed with patient.  -Advised gentle stretching daily and OTC voltaren gel -May do soaking with epsom salt with care to check temp of water before doing so -Continue with diabetic shoes -Return in 9  weeks or sooner if pain fails to resolve -Patient advised to call the office if any problems or questions arise in the meantime.  Landis Martins, DPM

## 2019-11-22 NOTE — Patient Instructions (Signed)
Get OTC voltaren from [facility] to use to your foot for pain

## 2019-12-03 ENCOUNTER — Encounter (HOSPITAL_COMMUNITY): Payer: Self-pay | Admitting: Emergency Medicine

## 2019-12-03 ENCOUNTER — Ambulatory Visit (HOSPITAL_COMMUNITY)
Admission: EM | Admit: 2019-12-03 | Discharge: 2019-12-03 | Disposition: A | Discharge status: Home / Self Care | Payer: Medicare Other | Attending: Family Medicine | Admitting: Family Medicine

## 2019-12-03 ENCOUNTER — Other Ambulatory Visit: Payer: Self-pay

## 2019-12-03 DIAGNOSIS — M542 Cervicalgia: Secondary | ICD-10-CM | POA: Diagnosis not present

## 2019-12-03 MED ORDER — METHOCARBAMOL 500 MG PO TABS
500.0000 mg | ORAL_TABLET | Freq: Three times a day (TID) | ORAL | 0 refills | Status: DC
Start: 2019-12-03 — End: 2020-05-08

## 2019-12-03 MED ORDER — HYDROCODONE-ACETAMINOPHEN 5-325 MG PO TABS: 1.0000 | ORAL_TABLET | Freq: Four times a day (QID) | ORAL | 0 refills | Status: DC | PRN

## 2019-12-03 NOTE — ED Triage Notes (Signed)
Pt presents with neck and shoulder pain xs 3 days. States pain is keeping him up at night. Denies injury or fall.   States advil gives some relief.

## 2019-12-03 NOTE — ED Provider Notes (Signed)
[place]    CSN: [id number] Arrival date & time: 12/03/19  1346      History   Chief Complaint Chief Complaint  Patient presents with  . Shoulder Pain    HPI Gregory Nunez. is a 74 y.o. male.   HPI  Patient states that Gregory Nunez woke up with a stiff neck 2 to 3 days ago.  Right side of his neck.  Painful with movement.  Hard to drive.  Denies any injury or fall.  Gregory Nunez states Advil gives him mild relief.  The pain is still significant and keeping him awake at night.  No numbness or weakness in the arms.  No trauma.  No overuse.  No prior history of neck pathology  Past Medical History:  Diagnosis Date  . Cancer [facility])    prostate  . Cataract    OD  . Depression   . Diabetes mellitus without complication ([place])   . Diabetic retinopathy ([place])    NPDR OU  . GERD (gastroesophageal reflux disease)    if drinks alcohol  . 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 Hyperlipidemia   . Hypertension   . Hypertensive retinopathy    OU  . Malignant neoplasm of prostate ([place]) 01/09/2014  . Pancreatitis   . Prostate cancer ([place]) 12/19/13   Gleason 4+3=7, volume 31.31 cc  . Shortness of breath dyspnea    with exertion     Patient Active Problem List   Diagnosis Date Noted  . Unresponsive 11/08/2018  . Syncope, convulsive 11/08/2018  . Essential hypertension 11/08/2018  . Alcohol withdrawal seizure with complication, with unspecified complication ([place]) 62/83/6629  . Type 2 diabetes mellitus without complication ([place]) 47/65/4650  . Symptomatic anemia 04/15/2018  . Prostate cancer ([place]) 02/27/2014  . Malignant neoplasm of prostate ([place]) 01/09/2014  . Bunion 01/17/2013  . 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 BIOPSY  04/16/2018   Procedure: BIOPSY;  Surgeon: Juanita Craver, MD;  Location: [facility];  Service: Endoscopy;;  . biopsy on  throat     hx of   . CATARACT EXTRACTION Bilateral    Dr. Quentin Ore  . ESOPHAGOGASTRODUODENOSCOPY Left 04/16/2018   Procedure: ESOPHAGOGASTRODUODENOSCOPY (EGD);  Surgeon: Juanita Craver, MD;  Location: [facility];  Service: Endoscopy;  Laterality: Left;  . EYE SURGERY    . FOOT SURGERY    . HOT HEMOSTASIS N/A 04/16/2018   Procedure: HOT HEMOSTASIS (ARGON PLASMA COAGULATION/BICAP);  Surgeon: Juanita Craver, MD;  Location: [facility];  Service: Endoscopy;  Laterality: N/A;  . LYMPHADENECTOMY Bilateral 02/27/2014   Procedure: BILATERAL LYMPHADENECTOMY;  Surgeon: Alexis Frock, MD;  Location: [facility];  Service: Urology;  Laterality: Bilateral;  . PROSTATE BIOPSY  12/2013   Gleason 4+3=7, volume 31.31 cc  . ROBOT ASSISTED LAPAROSCOPIC RADICAL PROSTATECTOMY N/A 02/27/2014   Procedure: ROBOTIC ASSISTED LAPAROSCOPIC RADICAL PROSTATECTOMY WITH INDOCYANINE GREEN DYE;  Surgeon: Alexis Frock, MD;  Location: [facility];  Service: Urology;  Laterality: N/A;       Home Medications    Prior to Admission medications   Medication Sig Start Date End Date Taking? Authorizing Provider  albuterol (VENTOLIN HFA) 108 (90 Base) MCG/ACT inhaler  08/21/19   [provider]  amLODipine (NORVASC) 10 MG tablet Take 10 mg by mouth daily. 09/30/19   [provider]  gabapentin (NEURONTIN) 300 MG capsule Take 300 mg by mouth  3 (three) times daily. 06/06/18   [provider]  glimepiride (AMARYL) 4 MG tablet Take 4 mg by mouth every evening.     [provider]  HYDROcodone-acetaminophen (NORCO/VICODIN) 5-325 MG tablet Take 1-2 tablets by mouth every 6 (six) hours as needed. 12/03/19   Raylene Everts, MD  losartan (COZAAR) 100 MG tablet Take 100 mg by mouth every evening.     [provider]  metFORMIN (GLUCOPHAGE) 500 MG tablet Take 500 mg by mouth 2 (two) times daily. 02/13/19   [provider]  methocarbamol (ROBAXIN) 500 MG tablet Take 1 tablet (500 mg total) by  mouth 3 (three) times daily. 12/03/19   Raylene Everts, MD  pantoprazole (PROTONIX) 40 MG tablet Take 1 tablet (40 mg total) by mouth daily. 04/17/18 08/19/19  Dana Allan I, MD  simvastatin (ZOCOR) 10 MG tablet Take 10 mg by mouth at bedtime.  11/23/18   [provider]  traZODone (DESYREL) 50 MG tablet Take 50 mg by mouth at bedtime as needed for sleep.    [provider]  COMBIVENT RESPIMAT 20-100 MCG/ACT AERS respimat Inhale 1 puff into the lungs every 6 (six) hours as needed for shortness of breath. 06/12/18 08/19/19  [provider]  ferrous sulfate 325 (65 FE) MG tablet Take 1 tablet (325 mg total) by mouth 2 (two) times daily with a meal. Patient not taking: Reported on 08/19/2019 04/17/18 08/19/19  Dana Allan I, MD  mometasone-formoterol (DULERA) 100-5 MCG/ACT AERO Inhale 2 puffs into the lungs daily. Patient not taking: Reported on 08/19/2019 04/17/18 08/19/19  Dana Allan I, MD  sucralfate (CARAFATE) 1 g tablet Take 1 tablet (1 g total) by mouth 4 (four) times daily. Patient not taking: Reported on 08/19/2019 04/17/18 08/19/19  Dana Allan I, MD  tiotropium (SPIRIVA HANDIHALER) 18 MCG inhalation capsule Place 1 capsule (18 mcg total) into inhaler and inhale daily. Patient not taking: Reported on 08/19/2019 04/17/18 08/19/19  Bonnell Public, MD    Family History Family History  Problem Relation Age of Onset  . Heart disease Mother   . Cancer Sister        breast  . Colon cancer Neg Hx   . Esophageal cancer Neg Hx   . Rectal cancer Neg Hx   . Stomach cancer Neg Hx     Social History Social History   Tobacco Use  . Smoking status: Current Every Day Smoker    Packs/day: 0.50  . Smokeless tobacco: Never Used  . Tobacco comment: smokes a couple every day or two  Vaping Use  . Vaping Use: Never used  Substance Use Topics  . Alcohol use: Yes    Comment: Patient states no alcohol in 2 years.   . Drug use: Yes    Types: Marijuana, Cocaine     Comment: past hx approx 30 years ago      Allergies   Penicillins and Penicillins   Review of Systems Review of Systems See HPI  Physical Exam Triage Vital Signs ED Triage Vitals  Enc Vitals Group     BP 12/03/19 1549 (!) 183/94     Pulse Rate 12/03/19 1549 95     Resp 12/03/19 1549 18     Temp 12/03/19 1549 98.7 F (37.1 C)     Temp Source 12/03/19 1549 Oral     SpO2 12/03/19 1549 98 %     Weight --      Height --      Head  Circumference --      Peak Flow --      Pain Score 12/03/19 1547 8     Pain Loc --      Pain Edu? --      Excl. in [place]? --    No data found.  Updated Vital Signs BP (!) 183/94 (BP Location: Left Arm)   Pulse 95   Temp 98.7 F (37.1 C) (Oral)   Resp 18   SpO2 98%      Physical Exam Constitutional:      General: Gregory Nunez is not in acute distress.    Appearance: Gregory Nunez is well-developed.  HENT:     Head: Normocephalic and atraumatic.     Mouth/Throat:     Comments: Mask in place Eyes:     Conjunctiva/sclera: Conjunctivae normal.     Pupils: Pupils are equal, round, and reactive to light.  Neck:     Comments: Tenderness in the right paraspinous cervical muscles some mild tenderness in the right upper body the trapezius.  Slow but full range of motion  Cardiovascular:     Rate and Rhythm: Normal rate.  Pulmonary:     Effort: Pulmonary effort is normal. No respiratory distress.  Abdominal:     General: There is no distension.     Palpations: Abdomen is soft.  Musculoskeletal:        General: Normal range of motion.     Cervical back: Tenderness present.     Comments: Strength sensation range of motion reflexes intact in both upper extremities  Skin:    General: Skin is warm and dry.  Neurological:     Mental Status: Gregory Nunez is alert.  Psychiatric:        Behavior: Behavior normal.      UC Treatments / Results  Labs (all labs ordered are listed, but only abnormal results are displayed) Labs Reviewed - No data to  display  EKG   Radiology No results found.  Procedures Procedures (including critical care time)  Medications Ordered in UC Medications - No data to display  Initial Impression / Assessment and Plan / UC Course  I have reviewed the triage vital signs and the nursing notes.  Pertinent labs & imaging results that were available during my care of the patient were reviewed by me and considered in my medical decision making (see chart for details).       Final Clinical Impressions(s) / UC Diagnoses   Final diagnoses:  Neck pain, musculoskeletal     Discharge Instructions     You may take ibuprofen for moderate pain.  600 mg 3 times a day with food If the pain is severe take the hydrocodone pain medicine.  Do not take hydrocodone and drive The methocarbamol as a muscle relaxer This is useful at bedtime.  You may take 3 times a day Put heat on the painful neck muscles Gentle massage See your primary care doctor if not improved in a couple days   ED Prescriptions    Medication Sig Dispense Auth. Provider   methocarbamol (ROBAXIN) 500 MG tablet Take 1 tablet (500 mg total) by mouth 3 (three) times daily. 30 tablet Raylene Everts, MD   HYDROcodone-acetaminophen (NORCO/VICODIN) 5-325 MG tablet Take 1-2 tablets by mouth every 6 (six) hours as needed. 10 tablet Raylene Everts, MD     I have reviewed the PDMP during this encounter.   Raylene Everts, MD 12/03/19 2135

## 2019-12-03 NOTE — Discharge Instructions (Signed)
You may take ibuprofen for moderate pain.  600 mg 3 times a day with food If the pain is severe take the hydrocodone pain medicine.  Do not take hydrocodone and drive The methocarbamol as a muscle relaxer This is useful at bedtime.  You may take 3 times a day Put heat on the painful neck muscles Gentle massage See your primary care doctor if not improved in a couple days

## 2019-12-05 ENCOUNTER — Other Ambulatory Visit: Payer: Self-pay

## 2019-12-05 ENCOUNTER — Encounter (HOSPITAL_COMMUNITY): Payer: Self-pay

## 2019-12-05 ENCOUNTER — Ambulatory Visit (HOSPITAL_COMMUNITY)
Admission: EM | Admit: 2019-12-05 | Discharge: 2019-12-05 | Disposition: A | Discharge status: Home / Self Care | Payer: Medicare Other | Attending: Urgent Care | Admitting: Urgent Care

## 2019-12-05 DIAGNOSIS — M5030 Other cervical disc degeneration, unspecified cervical region: Secondary | ICD-10-CM | POA: Diagnosis not present

## 2019-12-05 DIAGNOSIS — R030 Elevated blood-pressure reading, without diagnosis of hypertension: Secondary | ICD-10-CM

## 2019-12-05 DIAGNOSIS — I10 Essential (primary) hypertension: Secondary | ICD-10-CM

## 2019-12-05 DIAGNOSIS — M542 Cervicalgia: Secondary | ICD-10-CM

## 2019-12-05 LAB — CBG MONITORING, ED: Glucose-Capillary: 116 mg/dL — ABNORMAL HIGH (ref 70–99)

## 2019-12-05 MED ORDER — TRAMADOL HCL 50 MG PO TABS: 50.0000 mg | ORAL_TABLET | Freq: Four times a day (QID) | ORAL | 0 refills | Status: DC | PRN

## 2019-12-05 MED ORDER — PREDNISONE 20 MG PO TABS
ORAL_TABLET | ORAL | 0 refills | Status: DC
Start: 2019-12-05 — End: 2020-05-08

## 2019-12-05 NOTE — Discharge Instructions (Addendum)
Please schedule Tylenol at 500 mg - 650 mg once every 6 hours as needed for aches and pains.  If you still have pain despite taking Tylenol regularly, this is breakthrough pain.  You can use tramadol once every 6 hours for this.  Once your pain is better controlled, switch back to just Tylenol. It is okay to use this with your muscle relaxant and the steroid (prednisone) course.

## 2019-12-05 NOTE — ED Provider Notes (Signed)
[place]   MRN: [id number] DOB: [birth year]  Subjective:   Gregory Nunez. is a 74 y.o. male presenting for recheck from his visit on 12/03/2019.  Patient had neck pain then, states that it is not gotten worse.  But still feels a stiffness reports that the muscle relaxant is not helping.  Rates the pain 8 out of 10 when he lays down or turns his head, bends toward the right using his neck but in general was more moderate.  Denies fall, trauma, radicular symptoms, numbness or tingling, weakness.  Has a history of degenerative disc disease, seen on x-ray in 2017.  No current facility-administered medications for this encounter.  Current Outpatient Medications:  .  albuterol (VENTOLIN HFA) 108 (90 Base) MCG/ACT inhaler, , Disp: , Rfl:  .  amLODipine (NORVASC) 10 MG tablet, Take 10 mg by mouth daily., Disp: , Rfl:  .  gabapentin (NEURONTIN) 300 MG capsule, Take 300 mg by mouth 3 (three) times daily., Disp: , Rfl:  .  glimepiride (AMARYL) 4 MG tablet, Take 4 mg by mouth every evening. , Disp: , Rfl:  .  HYDROcodone-acetaminophen (NORCO/VICODIN) 5-325 MG tablet, Take 1-2 tablets by mouth every 6 (six) hours as needed., Disp: 10 tablet, Rfl: 0 .  losartan (COZAAR) 100 MG tablet, Take 100 mg by mouth every evening. , Disp: , Rfl:  .  metFORMIN (GLUCOPHAGE) 500 MG tablet, Take 500 mg by mouth 2 (two) times daily., Disp: , Rfl:  .  methocarbamol (ROBAXIN) 500 MG tablet, Take 1 tablet (500 mg total) by mouth 3 (three) times daily., Disp: 30 tablet, Rfl: 0 .  pantoprazole (PROTONIX) 40 MG tablet, Take 1 tablet (40 mg total) by mouth daily., Disp: 30 tablet, Rfl: 1 .  simvastatin (ZOCOR) 10 MG tablet, Take 10 mg by mouth at bedtime. , Disp: , Rfl:  .  traZODone (DESYREL) 50 MG tablet, Take 50 mg by mouth at bedtime as needed for sleep., Disp: , Rfl:    Allergies  Allergen Reactions  . Penicillins     Nervous   . Penicillins Anxiety and Other (See Comments)    DID THE REACTION  INVOLVE: Swelling of the face/tongue/throat, SOB, or low BP? No Sudden or severe rash/hives, skin peeling, or the inside of the mouth or nose? No Did it require medical treatment? No When did it last happen?many years ago If all above answers are "NO", may proceed with cephalosporin use.       Past Medical History:  Diagnosis Date  . Cancer [facility])    prostate  . Cataract    OD  . Depression   . Diabetes mellitus without complication ([place])   . Diabetic retinopathy ([place])    NPDR OU  . GERD (gastroesophageal reflux disease)    if drinks alcohol  . 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 Hyperlipidemia   . Hypertension   . Hypertensive retinopathy    OU  . Malignant neoplasm of prostate ([place]) 01/09/2014  . Pancreatitis   . 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 Shortness of breath dyspnea    with exertion      Past Surgical History:  Procedure Laterality Date  . BIOPSY  04/16/2018   Procedure: BIOPSY;  Surgeon: Juanita Craver, MD;  [facility];  Service: Endoscopy;;  . biopsy on throat     hx of   . CATARACT  EXTRACTION Bilateral    Dr. Quentin Ore  . ESOPHAGOGASTRODUODENOSCOPY Left 04/16/2018   Procedure: ESOPHAGOGASTRODUODENOSCOPY (EGD);  Surgeon: Juanita Craver, MD;  Location: [facility];  Service: Endoscopy;  Laterality: Left;  . EYE SURGERY    . FOOT SURGERY    . HOT HEMOSTASIS N/A 04/16/2018   Procedure: HOT HEMOSTASIS (ARGON PLASMA COAGULATION/BICAP);  Surgeon: Juanita Craver, MD;  Location: [facility];  Service: Endoscopy;  Laterality: N/A;  . LYMPHADENECTOMY Bilateral 02/27/2014   Procedure: BILATERAL LYMPHADENECTOMY;  Surgeon: Alexis Frock, MD;  Location[facility];  Service: Urology;  Laterality: Bilateral;  . PROSTATE BIOPSY  12/2013   Gleason 4+3=7, volume 31.31 cc  . ROBOT ASSISTED LAPAROSCOPIC RADICAL PROSTATECTOMY N/A 02/27/2014    Procedure: ROBOTIC ASSISTED LAPAROSCOPIC RADICAL PROSTATECTOMY WITH INDOCYANINE GREEN DYE;  Surgeon: Alexis Frock, MD;  [facility];  Service: Urology;  Laterality: N/A;    Family History  Problem Relation Age of Onset  . Heart disease Mother   . Cancer Sister        breast  . Colon cancer Neg Hx   . Esophageal cancer Neg Hx   . Rectal cancer Neg Hx   . Stomach cancer Neg Hx     Social History   Tobacco Use  . Smoking status: Current Every Day Smoker    Packs/day: 0.50  . Smokeless tobacco: Never Used  . Tobacco comment: smokes a couple every day or two  Vaping Use  . Vaping Use: Never used  Substance Use Topics  . Alcohol use: Not Currently    Comment: Patient states no alcohol in 2 years.   . Drug use: Not Currently    Types: Marijuana, Cocaine    Comment: past hx approx 30 years ago     ROS   Objective:   Vitals: BP (!) 195/43   Pulse 78   Temp 97.7 F (36.5 C) (Oral)   Resp 16   Ht 5\' 11"  (1.803 m)   Wt 186 lb (84.4 kg)   SpO2 98%   BMI 25.94 kg/m   BP 177/103 on recheck.  BP Readings from Last 3 Encounters:  12/05/19 (!) 195/43  12/03/19 (!) 183/94  08/19/19 (!) 170/86   Physical Exam Constitutional:      General: He is not in acute distress.    Appearance: Normal appearance. He is well-developed and normal weight. He is not ill-appearing, toxic-appearing or diaphoretic.  HENT:     Head: Normocephalic and atraumatic.     Right Ear: External ear normal.     Left Ear: External ear normal.     Nose: Nose normal.     Mouth/Throat:     Pharynx: Oropharynx is clear.  Eyes:     General: No scleral icterus.       Right eye: No discharge.        Left eye: No discharge.     Extraocular Movements: Extraocular movements intact.     Pupils: Pupils are equal, round, and reactive to light.  Cardiovascular:     Rate and Rhythm: Normal rate.  Pulmonary:     Effort: Pulmonary effort is normal.  Musculoskeletal:     Cervical back: Spasms (Along  paraspinal muscles and trapezius bilaterally worse on the right), torticollis and tenderness present. No swelling, edema, deformity, erythema, signs of trauma, lacerations, rigidity, bony tenderness or crepitus. Pain with movement (With range of motion testing to the right) present. Decreased range of motion (Rotation and lateral flexion to the right).  Skin:  General: Skin is warm and dry.  Neurological:     Mental Status: He is alert and oriented to person, place, and time.     Cranial Nerves: No cranial nerve deficit.     Motor: No weakness.     Coordination: Coordination normal.     Gait: Gait normal.     Deep Tendon Reflexes: Reflexes normal.  Psychiatric:        Mood and Affect: Mood normal.        Behavior: Behavior normal.        Thought Content: Thought content normal.        Judgment: Judgment normal.     Results for orders placed or performed during the hospital encounter of 12/05/19 (from the past 24 hour(s))  POC CBG monitoring     Status: Abnormal   Collection Time: 12/05/19 10:53 AM  Result Value Ref Range   Glucose-Capillary 116 (H) 70 - 99 mg/dL   Comment 1 Document in Chart     Assessment and Plan :   PDMP not reviewed this encounter.  1. Degenerative disc disease, cervical   2. Neck pain   3. Essential hypertension   4. Elevated blood pressure reading     Patient did not take his blood pressure medications today.  Emphasized need for compliance with this.  Recommended he start this and then use the medications prescribed today. Will use prednisone course, APAP with tramadol for breakthrough pain. Deferred imaging given lack of trauma, falls or physical exam findings warranting this. Counseled patient on potential for adverse effects with medications prescribed/recommended today, ER and return-to-clinic precautions discussed, patient verbalized understanding.    Jaynee Eagles, PA-C 12/05/19 1102

## 2019-12-05 NOTE — ED Triage Notes (Signed)
Pt c/o 8/10 sharp pain in neck. Pt was here yesterday for same and states the pain meds and muscle relaxer not helping. Pt states couldn't sleep last night. Pt states entire head hurts. Pt barely able to turn head side to side. Pt states he had difficulty driving here since he has decreased ROM of neck. Pt denies injury. Pt denies numbness and tingling. Pt able to move all extremities.

## 2020-01-24 ENCOUNTER — Ambulatory Visit: Payer: Medicare Other | Admitting: Sports Medicine

## 2020-04-23 ENCOUNTER — Observation Stay (HOSPITAL_COMMUNITY): Payer: Medicare Other

## 2020-04-23 ENCOUNTER — Inpatient Hospital Stay (HOSPITAL_COMMUNITY)
Admission: EM | Admit: 2020-04-23 | Discharge: 2020-05-08 | DRG: 065 | Disposition: A | Discharge status: Skilled Nursing Facility | Payer: Medicare Other | Attending: Internal Medicine | Admitting: Internal Medicine

## 2020-04-23 ENCOUNTER — Emergency Department (HOSPITAL_COMMUNITY): Payer: Medicare Other

## 2020-04-23 ENCOUNTER — Other Ambulatory Visit: Payer: Self-pay

## 2020-04-23 DIAGNOSIS — Z789 Other specified health status: Secondary | ICD-10-CM

## 2020-04-23 DIAGNOSIS — N184 Chronic kidney disease, stage 4 (severe): Secondary | ICD-10-CM | POA: Diagnosis present

## 2020-04-23 DIAGNOSIS — R1310 Dysphagia, unspecified: Secondary | ICD-10-CM | POA: Diagnosis present

## 2020-04-23 DIAGNOSIS — I152 Hypertension secondary to endocrine disorders: Secondary | ICD-10-CM | POA: Diagnosis present

## 2020-04-23 DIAGNOSIS — R29703 NIHSS score 3: Secondary | ICD-10-CM | POA: Diagnosis present

## 2020-04-23 DIAGNOSIS — Z7984 Long term (current) use of oral hypoglycemic drugs: Secondary | ICD-10-CM

## 2020-04-23 DIAGNOSIS — Z880 Allergy status to penicillin: Secondary | ICD-10-CM

## 2020-04-23 DIAGNOSIS — F17210 Nicotine dependence, cigarettes, uncomplicated: Secondary | ICD-10-CM | POA: Diagnosis present

## 2020-04-23 DIAGNOSIS — Z79899 Other long term (current) drug therapy: Secondary | ICD-10-CM

## 2020-04-23 DIAGNOSIS — E1169 Type 2 diabetes mellitus with other specified complication: Secondary | ICD-10-CM | POA: Diagnosis present

## 2020-04-23 DIAGNOSIS — R531 Weakness: Secondary | ICD-10-CM

## 2020-04-23 DIAGNOSIS — N179 Acute kidney failure, unspecified: Secondary | ICD-10-CM | POA: Diagnosis present

## 2020-04-23 DIAGNOSIS — J449 Chronic obstructive pulmonary disease, unspecified: Secondary | ICD-10-CM | POA: Diagnosis present

## 2020-04-23 DIAGNOSIS — G8191 Hemiplegia, unspecified affecting right dominant side: Secondary | ICD-10-CM | POA: Diagnosis present

## 2020-04-23 DIAGNOSIS — R29810 Facial weakness: Secondary | ICD-10-CM | POA: Diagnosis present

## 2020-04-23 DIAGNOSIS — Z8249 Family history of ischemic heart disease and other diseases of the circulatory system: Secondary | ICD-10-CM

## 2020-04-23 DIAGNOSIS — Z9183 Wandering in diseases classified elsewhere: Secondary | ICD-10-CM

## 2020-04-23 DIAGNOSIS — E1165 Type 2 diabetes mellitus with hyperglycemia: Secondary | ICD-10-CM | POA: Diagnosis not present

## 2020-04-23 DIAGNOSIS — K219 Gastro-esophageal reflux disease without esophagitis: Secondary | ICD-10-CM | POA: Diagnosis present

## 2020-04-23 DIAGNOSIS — E119 Type 2 diabetes mellitus without complications: Secondary | ICD-10-CM

## 2020-04-23 DIAGNOSIS — K5909 Other constipation: Secondary | ICD-10-CM | POA: Diagnosis not present

## 2020-04-23 DIAGNOSIS — I10 Essential (primary) hypertension: Secondary | ICD-10-CM | POA: Diagnosis not present

## 2020-04-23 DIAGNOSIS — I639 Cerebral infarction, unspecified: Secondary | ICD-10-CM

## 2020-04-23 DIAGNOSIS — H35033 Hypertensive retinopathy, bilateral: Secondary | ICD-10-CM | POA: Diagnosis present

## 2020-04-23 DIAGNOSIS — I6381 Other cerebral infarction due to occlusion or stenosis of small artery: Secondary | ICD-10-CM | POA: Diagnosis not present

## 2020-04-23 DIAGNOSIS — Z8673 Personal history of transient ischemic attack (TIA), and cerebral infarction without residual deficits: Secondary | ICD-10-CM | POA: Diagnosis present

## 2020-04-23 DIAGNOSIS — F1010 Alcohol abuse, uncomplicated: Secondary | ICD-10-CM | POA: Diagnosis present

## 2020-04-23 DIAGNOSIS — F1090 Alcohol use, unspecified, uncomplicated: Secondary | ICD-10-CM

## 2020-04-23 DIAGNOSIS — E1159 Type 2 diabetes mellitus with other circulatory complications: Secondary | ICD-10-CM | POA: Diagnosis present

## 2020-04-23 DIAGNOSIS — Z8546 Personal history of malignant neoplasm of prostate: Secondary | ICD-10-CM

## 2020-04-23 DIAGNOSIS — Z720 Tobacco use: Secondary | ICD-10-CM | POA: Diagnosis present

## 2020-04-23 DIAGNOSIS — N183 Chronic kidney disease, stage 3 unspecified: Secondary | ICD-10-CM | POA: Diagnosis present

## 2020-04-23 DIAGNOSIS — E118 Type 2 diabetes mellitus with unspecified complications: Secondary | ICD-10-CM

## 2020-04-23 DIAGNOSIS — E785 Hyperlipidemia, unspecified: Secondary | ICD-10-CM | POA: Diagnosis present

## 2020-04-23 DIAGNOSIS — Z803 Family history of malignant neoplasm of breast: Secondary | ICD-10-CM

## 2020-04-23 DIAGNOSIS — Z7289 Other problems related to lifestyle: Secondary | ICD-10-CM

## 2020-04-23 DIAGNOSIS — E11319 Type 2 diabetes mellitus with unspecified diabetic retinopathy without macular edema: Secondary | ICD-10-CM

## 2020-04-23 DIAGNOSIS — Z20822 Contact with and (suspected) exposure to covid-19: Secondary | ICD-10-CM | POA: Diagnosis present

## 2020-04-23 DIAGNOSIS — R4189 Other symptoms and signs involving cognitive functions and awareness: Secondary | ICD-10-CM | POA: Diagnosis present

## 2020-04-23 DIAGNOSIS — E113293 Type 2 diabetes mellitus with mild nonproliferative diabetic retinopathy without macular edema, bilateral: Secondary | ICD-10-CM | POA: Diagnosis present

## 2020-04-23 DIAGNOSIS — E876 Hypokalemia: Secondary | ICD-10-CM | POA: Diagnosis not present

## 2020-04-23 DIAGNOSIS — I2510 Atherosclerotic heart disease of native coronary artery without angina pectoris: Secondary | ICD-10-CM | POA: Diagnosis present

## 2020-04-23 HISTORY — DX: Other specified health status: Z78.9

## 2020-04-23 HISTORY — DX: Alcohol use, unspecified, uncomplicated: F10.90

## 2020-04-23 LAB — COMPREHENSIVE METABOLIC PANEL
ALT: 39 U/L (ref 0–44)
AST: 68 U/L — ABNORMAL HIGH (ref 15–41)
Albumin: 3.4 g/dL — ABNORMAL LOW (ref 3.5–5.0)
Alkaline Phosphatase: 45 U/L (ref 38–126)
Anion gap: 14 (ref 5–15)
BUN: 38 mg/dL — ABNORMAL HIGH (ref 8–23)
CO2: 19 mmol/L — ABNORMAL LOW (ref 22–32)
Calcium: 9.3 mg/dL (ref 8.9–10.3)
Chloride: 104 mmol/L (ref 98–111)
Creatinine, Ser: 2.35 mg/dL — ABNORMAL HIGH (ref 0.61–1.24)
GFR, Estimated: 29 mL/min — ABNORMAL LOW (ref >60)
Glucose, Bld: 110 mg/dL — ABNORMAL HIGH (ref 70–99)
Potassium: 3.5 mmol/L (ref 3.5–5.1)
Sodium: 137 mmol/L (ref 135–145)
Total Bilirubin: 0.8 mg/dL (ref 0.3–1.2)
Total Protein: 8 g/dL (ref 6.5–8.1)

## 2020-04-23 LAB — DIFFERENTIAL
Abs Immature Granulocytes: 0.02 10*3/uL (ref 0.00–0.07)
Basophils Absolute: 0 10*3/uL (ref 0.0–0.1)
Basophils Relative: 0 %
Eosinophils Absolute: 0 10*3/uL (ref 0.0–0.5)
Eosinophils Relative: 1 %
Immature Granulocytes: 0 %
Lymphocytes Relative: 22 %
Lymphs Abs: 1.2 10*3/uL (ref 0.7–4.0)
Monocytes Absolute: 0.5 10*3/uL (ref 0.1–1.0)
Monocytes Relative: 9 %
Neutro Abs: 3.5 10*3/uL (ref 1.7–7.7)
Neutrophils Relative %: 68 %

## 2020-04-23 LAB — I-STAT CHEM 8, ED
BUN: 38 mg/dL — ABNORMAL HIGH (ref 8–23)
Calcium, Ion: 1.11 mmol/L — ABNORMAL LOW (ref 1.15–1.40)
Chloride: 105 mmol/L (ref 98–111)
Creatinine, Ser: 2.2 mg/dL — ABNORMAL HIGH (ref 0.61–1.24)
Glucose, Bld: 108 mg/dL — ABNORMAL HIGH (ref 70–99)
HCT: 35 % — ABNORMAL LOW (ref 39.0–52.0)
Hemoglobin: 11.9 g/dL — ABNORMAL LOW (ref 13.0–17.0)
Potassium: 3.6 mmol/L (ref 3.5–5.1)
Sodium: 141 mmol/L (ref 135–145)
TCO2: 22 mmol/L (ref 22–32)

## 2020-04-23 LAB — PROTIME-INR
INR: 1 (ref 0.8–1.2)
Prothrombin Time: 13.1 seconds (ref 11.4–15.2)

## 2020-04-23 LAB — CBC
HCT: 35.7 % — ABNORMAL LOW (ref 39.0–52.0)
Hemoglobin: 11.6 g/dL — ABNORMAL LOW (ref 13.0–17.0)
MCH: 30.9 pg (ref 26.0–34.0)
MCHC: 32.5 g/dL (ref 30.0–36.0)
MCV: 95.2 fL (ref 80.0–100.0)
Platelets: 196 10*3/uL (ref 150–400)
RBC: 3.75 MIL/uL — ABNORMAL LOW (ref 4.22–5.81)
RDW: 11.6 % (ref 11.5–15.5)
WBC: 5.2 10*3/uL (ref 4.0–10.5)
nRBC: 0 % (ref 0.0–0.2)

## 2020-04-23 LAB — RESP PANEL BY RT-PCR (FLU A&B, COVID) ARPGX2
Influenza A by PCR: NEGATIVE
Influenza B by PCR: NEGATIVE
SARS Coronavirus 2 by RT PCR: NEGATIVE

## 2020-04-23 LAB — APTT: aPTT: 33 seconds (ref 24–36)

## 2020-04-23 LAB — CBG MONITORING, ED: Glucose-Capillary: 70 mg/dL (ref 70–99)

## 2020-04-23 MED ORDER — CLOPIDOGREL BISULFATE 75 MG PO TABS
75.0000 mg | ORAL_TABLET | Freq: Every day | ORAL | Status: DC
  Administered 2020-04-24 – 2020-05-08 (×15): 75 mg via ORAL
  Filled 2020-04-23 (×15): qty 1

## 2020-04-23 MED ORDER — SODIUM CHLORIDE 0.9 % IV SOLN: INTRAVENOUS | Status: AC

## 2020-04-23 MED ORDER — ASPIRIN EC 81 MG PO TBEC
81.0000 mg | DELAYED_RELEASE_TABLET | Freq: Every day | ORAL | Status: DC
  Administered 2020-04-24 – 2020-05-08 (×15): 81 mg via ORAL
  Filled 2020-04-23 (×15): qty 1

## 2020-04-23 MED ORDER — ENOXAPARIN SODIUM 30 MG/0.3ML ~~LOC~~ SOLN
30.0000 mg | SUBCUTANEOUS | Status: DC
  Administered 2020-04-23: 30 mg via SUBCUTANEOUS
  Filled 2020-04-23: qty 0.3

## 2020-04-23 MED ORDER — LORAZEPAM 2 MG/ML IJ SOLN: 0.0000 mg | Freq: Two times a day (BID) | INTRAMUSCULAR | Status: DC

## 2020-04-23 MED ORDER — THIAMINE HCL 100 MG/ML IJ SOLN: 100.0000 mg | Freq: Every day | INTRAMUSCULAR | Status: DC

## 2020-04-23 MED ORDER — INSULIN ASPART 100 UNIT/ML ~~LOC~~ SOLN
0.0000 [IU] | Freq: Three times a day (TID) | SUBCUTANEOUS | Status: DC
  Administered 2020-04-24 – 2020-04-25 (×3): 2 [IU] via SUBCUTANEOUS
  Administered 2020-04-25: 1 [IU] via SUBCUTANEOUS
  Administered 2020-04-26: 2 [IU] via SUBCUTANEOUS
  Administered 2020-04-26: 1 [IU] via SUBCUTANEOUS
  Administered 2020-04-26 – 2020-04-27 (×2): 3 [IU] via SUBCUTANEOUS
  Administered 2020-04-27 – 2020-04-28 (×2): 2 [IU] via SUBCUTANEOUS
  Administered 2020-04-28: 7 [IU] via SUBCUTANEOUS

## 2020-04-23 MED ORDER — ATORVASTATIN CALCIUM 40 MG PO TABS
40.0000 mg | ORAL_TABLET | Freq: Every day | ORAL | Status: DC
  Administered 2020-04-23 – 2020-05-08 (×16): 40 mg via ORAL
  Filled 2020-04-23 (×16): qty 1

## 2020-04-23 MED ORDER — NICOTINE 21 MG/24HR TD PT24
21.0000 mg | MEDICATED_PATCH | Freq: Every day | TRANSDERMAL | Status: DC
  Administered 2020-04-23 – 2020-05-08 (×16): 21 mg via TRANSDERMAL
  Filled 2020-04-23 (×16): qty 1

## 2020-04-23 MED ORDER — SENNOSIDES-DOCUSATE SODIUM 8.6-50 MG PO TABS
1.0000 | ORAL_TABLET | Freq: Every evening | ORAL | Status: DC | PRN
  Administered 2020-04-27: 1 via ORAL
  Filled 2020-04-23: qty 1

## 2020-04-23 MED ORDER — ACETAMINOPHEN 325 MG PO TABS: 650.0000 mg | ORAL_TABLET | ORAL | Status: DC | PRN

## 2020-04-23 MED ORDER — LORAZEPAM 2 MG/ML IJ SOLN: 0.0000 mg | Freq: Four times a day (QID) | INTRAMUSCULAR | Status: AC

## 2020-04-23 MED ORDER — ACETAMINOPHEN 160 MG/5ML PO SOLN: 650.0000 mg | ORAL | Status: DC | PRN

## 2020-04-23 MED ORDER — ACETAMINOPHEN 650 MG RE SUPP: 650.0000 mg | RECTAL | Status: DC | PRN

## 2020-04-23 MED ORDER — ASPIRIN 325 MG PO TABS
325.0000 mg | ORAL_TABLET | Freq: Once | ORAL | Status: AC
  Administered 2020-04-23: 325 mg via ORAL
  Filled 2020-04-23: qty 1

## 2020-04-23 MED ORDER — CHLORDIAZEPOXIDE HCL 25 MG PO CAPS
100.0000 mg | ORAL_CAPSULE | Freq: Once | ORAL | Status: AC
  Administered 2020-04-23: 100 mg via ORAL
  Filled 2020-04-23: qty 4

## 2020-04-23 MED ORDER — LORAZEPAM 1 MG PO TABS: 0.0000 mg | ORAL_TABLET | Freq: Four times a day (QID) | ORAL | Status: AC

## 2020-04-23 MED ORDER — SODIUM CHLORIDE 0.9 % IV BOLUS
1000.0000 mL | Freq: Once | INTRAVENOUS | Status: AC
  Administered 2020-04-23: 1000 mL via INTRAVENOUS

## 2020-04-23 MED ORDER — LORAZEPAM 1 MG PO TABS: 0.0000 mg | ORAL_TABLET | Freq: Two times a day (BID) | ORAL | Status: DC

## 2020-04-23 MED ORDER — STROKE: EARLY STAGES OF RECOVERY BOOK
Status: AC
  Filled 2020-04-23: qty 1

## 2020-04-23 MED ORDER — STROKE: EARLY STAGES OF RECOVERY BOOK: Freq: Once | Status: AC

## 2020-04-23 MED ORDER — THIAMINE HCL 100 MG PO TABS
100.0000 mg | ORAL_TABLET | Freq: Every day | ORAL | Status: DC
  Administered 2020-04-23 – 2020-05-08 (×16): 100 mg via ORAL
  Filled 2020-04-23 (×16): qty 1

## 2020-04-23 MED ORDER — GADOBUTROL 1 MMOL/ML IV SOLN
6.0000 mL | Freq: Once | INTRAVENOUS | Status: AC | PRN
  Administered 2020-04-23: 6 mL via INTRAVENOUS

## 2020-04-23 MED ORDER — SODIUM CHLORIDE 0.9% FLUSH
3.0000 mL | Freq: Once | INTRAVENOUS | Status: AC
  Administered 2020-04-23: 3 mL via INTRAVENOUS

## 2020-04-23 NOTE — H&P (Signed)
History and Physical    Laverle Patter Criss Alvine. PYK:[id number] DOB: 03-04-1946 DOA: 04/23/2020  PCP: Fleet Contras, MD  [facility]  I have personally briefly reviewed patient's old medical records in [facility]  Chief Complaint: Right-sided weakness  HPI: Shahin Knierim. is a 75 y.o. male with medical history significant for type 2 diabetes with diabetic retinopathy, hypertension, hyperlipidemia, prostate cancer, and alcohol use who presents to the ED for evaluation of right-sided weakness.  History somewhat limited from patient due to inability to recall recent events and is otherwise supplemented by EDP and chart review.  Patient states he has been having a hard time the last few days but does not specify what has been bothering him.  He says he has not been eating or drinking well.  He admits that he has not been taking his medications for some time.  He says he has had some weakness in his right leg.  Per ED documentation patient was found [facility].  EMS were called and he had reported right-sided facial droop and altered mental status therefore was brought to the ED for further evaluation.  There was concern for excessive alcohol use from prior providers.  Upon questioning, patient states that his last drink was 12 days ago providing an inconsistent history.  He does report chronic tobacco use of 1.5 packs/day.  ED Course:  Initial vitals showed BP 190/94, pulse 95, RR 15, SPO2 97% on room air.  Labs show WBC 5.2, hemoglobin 11.6, platelets 196,000, sodium 137, potassium 3.5, bicarb 19, BUN 38, creatinine 2.35, serum glucose 110, AST 68, ALT 39, alk phos 45, total bilirubin 0.8.  SARS-CoV-2 PCR panel is ordered and pending.  Patient arrived as a code stroke and was seen by neurology.  CT head without contrast showed new hypodensity in the left paramedian pons suspicious for acute infarction and new ill-defined hypodensity in left  lenticular nucleus suspicious for subacute infarct.  Patient was ordered to receive aspirin 325 mg, 1 L normal saline.  There was concern for alcohol withdrawal and patient was ordered to receive Librium with Ativan as needed per CIWA protocol.  The hospitalist service was consulted to admit for further evaluation and management.  Review of Systems: All systems reviewed and are negative except as documented in history of present illness above.   Past Medical History:  Diagnosis Date  . Cancer [facility])    prostate  . Cataract    OD  . Depression   . Diabetes mellitus without complication (HCC)   . Diabetic retinopathy (HCC)    NPDR OU  . GERD (gastroesophageal reflux disease)    if drinks alcohol  . 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 Hyperlipidemia   . Hypertension   . Hypertensive retinopathy    OU  . Malignant neoplasm of prostate (HCC) 01/09/2014  . Pancreatitis   . Prostate cancer (HCC) 12/19/13   Gleason 4+3=7, volume 31.31 cc  . Shortness of breath dyspnea    with exertion     Past Surgical History:  Procedure Laterality Date  . BIOPSY  04/16/2018   Procedure: BIOPSY;  Surgeon: Charna Elizabeth, MD;  Location: [facility] ENDOSCOPY;  Service: Endoscopy;;  . biopsy on throat     hx of   . CATARACT EXTRACTION Bilateral    Dr. Baker Pierini  . ESOPHAGOGASTRODUODENOSCOPY Left 04/16/2018   Procedure: ESOPHAGOGASTRODUODENOSCOPY (EGD);  Surgeon: Charna Elizabeth, MD;  Location: [facility];  Service: Endoscopy;  Laterality: Left;  . EYE SURGERY    . FOOT SURGERY    . HOT HEMOSTASIS N/A 04/16/2018   Procedure: HOT HEMOSTASIS (ARGON PLASMA COAGULATION/BICAP);  Surgeon: Charna Elizabeth, MD;  Location: [facility];  Service: Endoscopy;  Laterality: N/A;  . LYMPHADENECTOMY Bilateral 02/27/2014   Procedure: BILATERAL LYMPHADENECTOMY;  Surgeon: Sebastian Ache, MD;  Location: [facility] ORS;  Service: Urology;  Laterality:  Bilateral;  . PROSTATE BIOPSY  12/2013   Gleason 4+3=7, volume 31.31 cc  . ROBOT ASSISTED LAPAROSCOPIC RADICAL PROSTATECTOMY N/A 02/27/2014   Procedure: ROBOTIC ASSISTED LAPAROSCOPIC RADICAL PROSTATECTOMY WITH INDOCYANINE GREEN DYE;  Surgeon: Sebastian Ache, MD;  Location: [facility] ORS;  Service: Urology;  Laterality: N/A;    Social History:  reports that he has been smoking. He has been smoking about 0.50 packs per day. He has never used smokeless tobacco. He reports previous alcohol use. He reports previous drug use. Drugs: Marijuana and Cocaine.  Allergies  Allergen Reactions  . Penicillins     Nervous   . Penicillins Anxiety and Other (See Comments)    DID THE REACTION INVOLVE: Swelling of the face/tongue/throat, SOB, or low BP? No Sudden or severe rash/hives, skin peeling, or the inside of the mouth or nose? No Did it require medical treatment? No When did it last happen?many years ago If all above answers are "NO", may proceed with cephalosporin use.       Family History  Problem Relation Age of Onset  . Heart disease Mother   . Cancer Sister        breast  . Colon cancer Neg Hx   . Esophageal cancer Neg Hx   . Rectal cancer Neg Hx   . Stomach cancer Neg Hx      Prior to Admission medications   Medication Sig Start Date End Date Taking? Authorizing Provider  albuterol (VENTOLIN HFA) 108 (90 Base) MCG/ACT inhaler  08/21/19   [provider]  amLODipine (NORVASC) 10 MG tablet Take 10 mg by mouth daily. 09/30/19   [provider]  gabapentin (NEURONTIN) 300 MG capsule Take 300 mg by mouth 3 (three) times daily. 06/06/18   [provider]  glimepiride (AMARYL) 4 MG tablet Take 4 mg by mouth every evening.     [provider]  HYDROcodone-acetaminophen (NORCO/VICODIN) 5-325 MG tablet Take 1-2 tablets by mouth every 6 (six) hours as needed. 12/03/19   Eustace Moore, MD  losartan (COZAAR) 100 MG tablet Take 100 mg by mouth every evening.      [provider]  metFORMIN (GLUCOPHAGE) 500 MG tablet Take 500 mg by mouth 2 (two) times daily. 02/13/19   [provider]  methocarbamol (ROBAXIN) 500 MG tablet Take 1 tablet (500 mg total) by mouth 3 (three) times daily. 12/03/19   Eustace Moore, MD  pantoprazole (PROTONIX) 40 MG tablet Take 1 tablet (40 mg total) by mouth daily. 04/17/18 08/19/19  Barnetta Chapel, MD  predniSONE (DELTASONE) 20 MG tablet Take 2 tablets daily with breakfast. 12/05/19   Wallis Bamberg, PA-C  simvastatin (ZOCOR) 10 MG tablet Take 10 mg by mouth at bedtime.  11/23/18   [provider]  traMADol (ULTRAM) 50 MG tablet Take 1 tablet (50 mg total) by mouth every 6 (six) hours as needed. 12/05/19   Wallis Bamberg, PA-C  traZODone (DESYREL) 50 MG tablet Take 50 mg by mouth at bedtime as needed for sleep.    [provider]  COMBIVENT RESPIMAT 20-100 MCG/ACT AERS respimat Inhale 1 puff into the lungs every 6 (six) hours as needed for shortness of breath. 06/12/18 08/19/19  [provider]  ferrous sulfate 325 (65 FE) MG tablet Take 1 tablet (325 mg total) by mouth 2 (two) times daily with a meal. Patient not taking: Reported on 08/19/2019 04/17/18 08/19/19  Berton Mount I, MD  mometasone-formoterol (DULERA) 100-5 MCG/ACT AERO Inhale 2 puffs into the lungs daily. Patient not taking: Reported on 08/19/2019 04/17/18 08/19/19  Berton Mount I, MD  sucralfate (CARAFATE) 1 g tablet Take 1 tablet (1 g total) by mouth 4 (four) times daily. Patient not taking: Reported on 08/19/2019 04/17/18 08/19/19  Berton Mount I, MD  tiotropium (SPIRIVA HANDIHALER) 18 MCG inhalation capsule Place 1 capsule (18 mcg total) into inhaler and inhale daily. Patient not taking: Reported on 08/19/2019 04/17/18 08/19/19  Barnetta Chapel, MD    Physical Exam: Vitals:   04/23/20 1745 04/23/20 1800 04/23/20 1815 04/23/20 1825  BP: (!) 190/94 (!) 207/105 (!) 200/109   Pulse: 95 (!) 106 97   Resp: 15 (!) 24 11    Temp:    97.8 F (36.6 C)  TempSrc:    Oral  SpO2: 97% 98% 97%   Weight:      Height:       Constitutional: Resting supine in bed, NAD, calm, comfortable Eyes: PERRL, EOMI, lids and conjunctivae normal ENMT: Mucous membranes are dry. Posterior pharynx clear of any exudate or lesions.Normal dentition.  Neck: normal, supple, no masses. Respiratory: clear to auscultation bilaterally, no wheezing, no crackles. Normal respiratory effort. No accessory muscle use.  Cardiovascular: Tachycardic, no murmurs / rubs / gallops. No extremity edema. 2+ pedal pulses. Abdomen: no tenderness, no masses palpated. No hepatosplenomegaly. Bowel sounds positive.  Musculoskeletal: no clubbing / cyanosis. No joint deformity upper and lower extremities. Good ROM, no contractures. Normal muscle tone.  Skin: no rashes, lesions, ulcers. No induration Neurologic: Slight right facial droop otherwise CN 2-12 grossly intact. Sensation intact, Strength 5/5 in all 4.  Past-pointing with left hand on FTN otherwise intact. Psychiatric: Awake, alert, and oriented x3 but not really providing detailed history.  Labs on Admission: I have personally reviewed following labs and imaging studies  CBC: Recent Labs  Lab 04/23/20 1715 04/23/20 1723  WBC 5.2  --   NEUTROABS 3.5  --   HGB 11.6* 11.9*  HCT 35.7* 35.0*  MCV 95.2  --   PLT 196  --    Basic Metabolic Panel: Recent Labs  Lab 04/23/20 1715 04/23/20 1723  NA 137 141  K 3.5 3.6  CL 104 105  CO2 19*  --   GLUCOSE 110* 108*  BUN 38* 38*  CREATININE 2.35* 2.20*  CALCIUM 9.3  --    GFR: Estimated Creatinine Clearance: 32.3 mL/min (A) (by C-G formula based on SCr of 2.2 mg/dL (H)). Liver Function Tests: Recent Labs  Lab 04/23/20 1715  AST 68*  ALT 39  ALKPHOS 45  BILITOT 0.8  PROT 8.0  ALBUMIN 3.4*   No results for input(s): LIPASE, AMYLASE in the last 168 hours. No results for input(s): AMMONIA in the last 168 hours. Coagulation Profile: Recent  Labs  Lab 04/23/20 1715  INR 1.0   Cardiac Enzymes: No results for input(s): CKTOTAL, CKMB, CKMBINDEX, TROPONINI in the last 168 hours. BNP (last 3 results) No results for input(s): PROBNP in the last 8760 hours. HbA1C: No results for input(s): HGBA1C in the last 72  hours. CBG: No results for input(s): GLUCAP in the last 168 hours. Lipid Profile: No results for input(s): CHOL, HDL, LDLCALC, TRIG, CHOLHDL, LDLDIRECT in the last 72 hours. Thyroid Function Tests: No results for input(s): TSH, T4TOTAL, FREET4, T3FREE, THYROIDAB in the last 72 hours. Anemia Panel: No results for input(s): VITAMINB12, FOLATE, FERRITIN, TIBC, IRON, RETICCTPCT in the last 72 hours. Urine analysis:    Component Value Date/Time   COLORURINE COLORLESS (A) 04/15/2018 2221   APPEARANCEUR CLEAR 04/15/2018 2221   LABSPEC 1.025 01/10/2019 1132   PHURINE 6.5 01/10/2019 1132   GLUCOSEU 500 (A) 01/10/2019 1132   HGBUR MODERATE (A) 01/10/2019 1132   BILIRUBINUR NEGATIVE 01/10/2019 1132   [place] 01/10/2019 1132   PROTEINUR >=300 (A) 01/10/2019 1132   UROBILINOGEN 1.0 01/10/2019 1132   NITRITE NEGATIVE 01/10/2019 1132   LEUKOCYTESUR TRACE (A) 01/10/2019 1132    Radiological Exams on Admission: CT HEAD CODE STROKE WO CONTRAST  Result Date: 04/23/2020 CLINICAL DATA:  Code stroke. Acute neuro deficit. Right facial droop and confusion EXAM: CT HEAD WITHOUT CONTRAST TECHNIQUE: Contiguous axial images were obtained from the base of the skull through the vertex without intravenous contrast. COMPARISON:  CT head 11/07/2018 FINDINGS: Brain: New area of ill-defined hypodensity in the left lenticular nucleus likely a subacute infarct. New hypodensity in the left paramedian pons which is relatively well defined and likely is a chronic lacunar infarct but could be subacute. Generalized atrophy. Chronic microvascular ischemic changes in the white matter are relatively mild. Negative for hemorrhage or mass. Ventricle  size normal. Vascular: Negative for hyperdense vessel Skull: Negative Sinuses/Orbits: Paranasal sinuses clear. Bilateral cataract extraction. Other: None ASPECTS ([place] Stroke Program Early CT Score) - Ganglionic level infarction (caudate, lentiform nuclei, internal capsule, insula, M1-M3 cortex): 6 - Supraganglionic infarction (M4-M6 cortex): 3 Total score (0-10 with 10 being normal): 9 IMPRESSION: 1. New ill-defined hypodensity left lenticular nucleus suspicious for subacute infarct. 2. New hypodensity in the left paramedian pons consistent with acute infarction which is likely chronic but could be recent. 3. Negative for hemorrhage 4. ASPECTS is 9 5. Code stroke imaging results were communicated on 04/23/2020 at 5:36 pm to provider Lorrin Goodell via text page Electronically Signed   By: Franchot Gallo M.D.   On: 04/23/2020 17:39    EKG: Personally reviewed. Sinus tachycardia, rate 101, nonspecific T wave changes lateral leads.  Not significantly changed when compared to prior.  Assessment/Plan Principal Problem:   Acute CVA (cerebrovascular accident) ([place]) Active Problems:   Hypertension associated with diabetes ([place])   Type 2 diabetes mellitus without complication ([place])   Hyperlipidemia associated with type 2 diabetes mellitus ([place])   Alcohol use  Jaikob Borgwardt Oliver Heitzenrater. is a 75 y.o. male with medical history significant for type 2 diabetes with diabetic retinopathy, hypertension, hyperlipidemia, prostate cancer, and alcohol use who is admitted with acute CVA.  Acute CVA: Initial CT head suspicious for acute left paramedian pons and subacute left lenticular nucleus infarcts. -Neurology consulted and following -Obtain MRI brain -MRA head and neck -Given aspirin 325 mg today followed by 81 mg daily -Plavix 75 mg daily -Echocardiogram -PT/OT/SLP eval -Monitor on telemetry, neurochecks -Check A1c, lipid panel -Allow permissive hypertension  Acute kidney injury: Suspect prerenal from poor  oral intake per patient.  Receiving 1 L normal saline bolus.  Placed on maintenance IV fluids afterwards and repeat labs in a.m.  Check urinalysis.  Hypertension: Holding home amlodipine and losartan to allow for permissive hypertension.    Type 2 diabetes:  Place on sensitive SSI.  Check A1c.  Hyperlipidemia: Start atorvastatin 40 mg daily.  Alcohol use: Patient providing inconsistent history regarding alcohol use.  There was concern for withdrawal and patient has been given Librium and started on CIWA protocol with as needed Ativan.  Continue for now.  Tobacco use: Smoking 1.5 packs/day.  Patient advised on cessation.  Nicotine patch provided.  DVT prophylaxis: Lovenox Code Status: Full code, confirmed with patient Family Communication: Discussed with patient Disposition Plan: From home, dispo pending further stroke work-up and PT/OT/SLP eval Consults called: Neurology Admission status:  Status is: Observation  The patient remains OBS appropriate and will d/c before 2 midnights.  Dispo: The patient is from: Home              Anticipated d/c is to: Home versus SNF              Anticipated d/c date is: 1 day              Patient currently is not medically stable to d/c.  Zada Finders MD [facility]  If 7PM-7AM, please contact night-coverage [email or web address]  04/23/2020, 6:38 PM

## 2020-04-23 NOTE — ED Provider Notes (Signed)
[facility] EMERGENCY DEPARTMENT Provider Note   CSN: [id number] Arrival date & time: 04/23/20  1712     History No chief complaint on file.   Gregory Nunez. is a 75 y.o. male.  75 yo M with a chief complaints of right-sided weakness.  Patient was found wandering around outside and EMS was called.  He was made a code stroke in route.  Level 5 caveat acuity of condition.  The history is provided by the patient and the EMS personnel.       Past Medical History:  Diagnosis Date  . Cancer [facility])    prostate  . Cataract    OD  . Depression   . Diabetes mellitus without complication ([place])   . Diabetic retinopathy ([place])    NPDR OU  . GERD (gastroesophageal reflux disease)    if drinks alcohol  . 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 Hyperlipidemia   . Hypertension   . Hypertensive retinopathy    OU  . Malignant neoplasm of prostate ([place]) 01/09/2014  . Pancreatitis   . Prostate cancer ([place]) 12/19/13   Gleason 4+3=7, volume 31.31 cc  . Shortness of breath dyspnea    with exertion     Patient Active Problem List   Diagnosis Date Noted  . Unresponsive 11/08/2018  . Syncope, convulsive 11/08/2018  . Essential hypertension 11/08/2018  . Alcohol withdrawal seizure with complication, with unspecified complication ([place]) XX123456  . Type 2 diabetes mellitus without complication ([place]) XX123456  . Symptomatic anemia 04/15/2018  . Prostate cancer ([place]) 02/27/2014  . Malignant neoplasm of prostate ([place]) 01/09/2014  . Bunion 01/17/2013  . 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 BIOPSY  04/16/2018   Procedure: BIOPSY;  Surgeon: Juanita Craver, MD;  Location: [facility] ENDOSCOPY;  Service: Endoscopy;;  . biopsy on throat     hx of   . CATARACT EXTRACTION Bilateral    Dr. Quentin Ore  . ESOPHAGOGASTRODUODENOSCOPY Left  04/16/2018   Procedure: ESOPHAGOGASTRODUODENOSCOPY (EGD);  Surgeon: Juanita Craver, MD;  Location: [facility];  Service: Endoscopy;  Laterality: Left;  . EYE SURGERY    . FOOT SURGERY    . HOT HEMOSTASIS N/A 04/16/2018   Procedure: HOT HEMOSTASIS (ARGON PLASMA COAGULATION/BICAP);  Surgeon: Juanita Craver, MD;  Location: [facility];  Service: Endoscopy;  Laterality: N/A;  . LYMPHADENECTOMY Bilateral 02/27/2014   Procedure: BILATERAL LYMPHADENECTOMY;  Surgeon: Alexis Frock, MD;  Location: [facility] ORS;  Service: Urology;  Laterality: Bilateral;  . PROSTATE BIOPSY  12/2013   Gleason 4+3=7, volume 31.31 cc  . ROBOT ASSISTED LAPAROSCOPIC RADICAL PROSTATECTOMY N/A 02/27/2014   Procedure: ROBOTIC ASSISTED LAPAROSCOPIC RADICAL PROSTATECTOMY WITH INDOCYANINE GREEN DYE;  Surgeon: Alexis Frock, MD;  Location: [facility] ORS;  Service: Urology;  Laterality: N/A;       Family History  Problem Relation Age of Onset  . Heart disease Mother   . Cancer Sister        breast  . Colon cancer Neg Hx   . Esophageal cancer Neg Hx   . Rectal cancer Neg Hx   . Stomach cancer Neg Hx     Social History   Tobacco Use  . Smoking status: Current Every Day Smoker    Packs/day: 0.50  . Smokeless tobacco: Never Used  . Tobacco comment: smokes a couple every day or two  Vaping Use  .  Vaping Use: Never used  Substance Use Topics  . Alcohol use: Not Currently    Comment: Patient states no alcohol in 2 years.   . Drug use: Not Currently    Types: Marijuana, Cocaine    Comment: past hx approx 30 years ago     Home Medications Prior to Admission medications   Medication Sig Start Date End Date Taking? Authorizing Provider  albuterol (VENTOLIN HFA) 108 (90 Base) MCG/ACT inhaler  08/21/19   [provider]  amLODipine (NORVASC) 10 MG tablet Take 10 mg by mouth daily. 09/30/19   [provider]  gabapentin (NEURONTIN) 300 MG capsule Take 300 mg by mouth 3 (three) times daily. 06/06/18   [provider]  glimepiride (AMARYL) 4 MG tablet Take 4 mg by mouth every evening.     [provider]  HYDROcodone-acetaminophen (NORCO/VICODIN) 5-325 MG tablet Take 1-2 tablets by mouth every 6 (six) hours as needed. 12/03/19   Eustace Moore, MD  losartan (COZAAR) 100 MG tablet Take 100 mg by mouth every evening.     [provider]  metFORMIN (GLUCOPHAGE) 500 MG tablet Take 500 mg by mouth 2 (two) times daily. 02/13/19   [provider]  methocarbamol (ROBAXIN) 500 MG tablet Take 1 tablet (500 mg total) by mouth 3 (three) times daily. 12/03/19   Eustace Moore, MD  pantoprazole (PROTONIX) 40 MG tablet Take 1 tablet (40 mg total) by mouth daily. 04/17/18 08/19/19  Barnetta Chapel, MD  predniSONE (DELTASONE) 20 MG tablet Take 2 tablets daily with breakfast. 12/05/19   Wallis Bamberg, PA-C  simvastatin (ZOCOR) 10 MG tablet Take 10 mg by mouth at bedtime.  11/23/18   [provider]  traMADol (ULTRAM) 50 MG tablet Take 1 tablet (50 mg total) by mouth every 6 (six) hours as needed. 12/05/19   Wallis Bamberg, PA-C  traZODone (DESYREL) 50 MG tablet Take 50 mg by mouth at bedtime as needed for sleep.    [provider]  COMBIVENT RESPIMAT 20-100 MCG/ACT AERS respimat Inhale 1 puff into the lungs every 6 (six) hours as needed for shortness of breath. 06/12/18 08/19/19  [provider]  ferrous sulfate 325 (65 FE) MG tablet Take 1 tablet (325 mg total) by mouth 2 (two) times daily with a meal. Patient not taking: Reported on 08/19/2019 04/17/18 08/19/19  Berton Mount I, MD  mometasone-formoterol (DULERA) 100-5 MCG/ACT AERO Inhale 2 puffs into the lungs daily. Patient not taking: Reported on 08/19/2019 04/17/18 08/19/19  Berton Mount I, MD  sucralfate (CARAFATE) 1 g tablet Take 1 tablet (1 g total) by mouth 4 (four) times daily. Patient not taking: Reported on 08/19/2019 04/17/18 08/19/19  Berton Mount I, MD  tiotropium (SPIRIVA HANDIHALER) 18 MCG  inhalation capsule Place 1 capsule (18 mcg total) into inhaler and inhale daily. Patient not taking: Reported on 08/19/2019 04/17/18 08/19/19  Berton Mount I, MD    Allergies    Penicillins and Penicillins  Review of Systems   Review of Systems  Constitutional: Negative for chills and fever.  HENT: Negative for congestion and facial swelling.   Eyes: Negative for discharge and visual disturbance.  Respiratory: Negative for shortness of breath.   Cardiovascular: Negative for chest pain and palpitations.  Gastrointestinal: Negative for abdominal pain, diarrhea and vomiting.  Musculoskeletal: Negative for arthralgias and myalgias.  Skin: Negative for color change and rash.  Neurological: Positive for weakness. Negative for tremors, syncope and headaches.  Psychiatric/Behavioral: Negative for confusion and dysphoric  mood.    Physical Exam Updated Vital Signs Ht 5\' 11"  (1.803 m)   Wt 84.4 kg   BMI 25.95 kg/m   Physical Exam Vitals and nursing note reviewed.  Constitutional:      Appearance: He is well-developed and well-nourished.  HENT:     Head: Normocephalic and atraumatic.  Eyes:     Extraocular Movements: EOM normal.     Pupils: Pupils are equal, round, and reactive to light.  Neck:     Vascular: No JVD.  Cardiovascular:     Rate and Rhythm: Normal rate and regular rhythm.     Heart sounds: No murmur heard. No friction rub. No gallop.   Pulmonary:     Effort: No respiratory distress.     Breath sounds: No wheezing.  Abdominal:     General: There is no distension.     Tenderness: There is no guarding or rebound.  Musculoskeletal:        General: Normal range of motion.     Cervical back: Normal range of motion and neck supple.  Skin:    Coloration: Skin is not pale.     Findings: No rash.  Neurological:     Mental Status: He is alert and oriented to person, place, and time.     Comments: Leg worse than arm weakness 4 out of 5 compared to 5 out of 5 on the  left.  Patient is right-handed.  Psychiatric:        Mood and Affect: Mood and affect normal.        Behavior: Behavior normal.     ED Results / Procedures / Treatments   Labs (all labs ordered are listed, but only abnormal results are displayed) Labs Reviewed  CBC - Abnormal; Notable for the following components:      Result Value   RBC 3.75 (*)    Hemoglobin 11.6 (*)    HCT 35.7 (*)    All other components within normal limits  I-STAT CHEM 8, ED - Abnormal; Notable for the following components:   BUN 38 (*)    Creatinine, Ser 2.20 (*)    Glucose, Bld 108 (*)    Calcium, Ion 1.11 (*)    Hemoglobin 11.9 (*)    HCT 35.0 (*)    All other components within normal limits  RESP PANEL BY RT-PCR (FLU A&B, COVID) ARPGX2  PROTIME-INR  APTT  DIFFERENTIAL  COMPREHENSIVE METABOLIC PANEL  CBG MONITORING, ED    EKG None  Radiology CT HEAD CODE STROKE WO CONTRAST  Result Date: 04/23/2020 CLINICAL DATA:  Code stroke. Acute neuro deficit. Right facial droop and confusion EXAM: CT HEAD WITHOUT CONTRAST TECHNIQUE: Contiguous axial images were obtained from the base of the skull through the vertex without intravenous contrast. COMPARISON:  CT head 11/07/2018 FINDINGS: Brain: New area of ill-defined hypodensity in the left lenticular nucleus likely a subacute infarct. New hypodensity in the left paramedian pons which is relatively well defined and likely is a chronic lacunar infarct but could be subacute. Generalized atrophy. Chronic microvascular ischemic changes in the white matter are relatively mild. Negative for hemorrhage or mass. Ventricle size normal. Vascular: Negative for hyperdense vessel Skull: Negative Sinuses/Orbits: Paranasal sinuses clear. Bilateral cataract extraction. Other: None ASPECTS ([place] Stroke Program Early CT Score) - Ganglionic level infarction (caudate, lentiform nuclei, internal capsule, insula, M1-M3 cortex): 6 - Supraganglionic infarction (M4-M6 cortex): 3 Total  score (0-10 with 10 being normal): 9 IMPRESSION: 1. New ill-defined hypodensity left lenticular nucleus  suspicious for subacute infarct. 2. New hypodensity in the left paramedian pons consistent with acute infarction which is likely chronic but could be recent. 3. Negative for hemorrhage 4. ASPECTS is 9 5. Code stroke imaging results were communicated on 04/23/2020 at 5:36 pm to provider Lorrin Goodell via text page Electronically Signed   By: Franchot Gallo M.D.   On: 04/23/2020 17:39    Procedures Procedures (including critical care time)  Medications Ordered in ED Medications  sodium chloride flush (NS) 0.9 % injection 3 mL (has no administration in time range)  LORazepam (ATIVAN) injection 0-4 mg (has no administration in time range)    Or  LORazepam (ATIVAN) tablet 0-4 mg (has no administration in time range)  LORazepam (ATIVAN) injection 0-4 mg (has no administration in time range)    Or  LORazepam (ATIVAN) tablet 0-4 mg (has no administration in time range)  thiamine tablet 100 mg (has no administration in time range)    Or  thiamine (B-1) injection 100 mg (has no administration in time range)  chlordiazePOXIDE (LIBRIUM) capsule 100 mg (has no administration in time range)    ED Course  I have reviewed the triage vital signs and the nursing notes.  Pertinent labs & imaging results that were available during my care of the patient were reviewed by me and considered in my medical decision making (see chart for details).    MDM Rules/Calculators/A&P                          75 yo M with a chief complaints of right-sided weakness.  This was noted by EMS in route and he was made a code stroke.  Patient unfortunately is unable to know when the last time his leg was well.  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  Recommending medical admission.  The patients results and plan were reviewed and discussed.   Any x-rays performed were independently reviewed by myself.    Differential diagnosis were considered with the presenting HPI.  Medications  sodium chloride flush (NS) 0.9 % injection 3 mL (has no administration in time range)  LORazepam (ATIVAN) injection 0-4 mg (has no administration in time range)    Or  LORazepam (ATIVAN) tablet 0-4 mg (has no administration in time range)  LORazepam (ATIVAN) injection 0-4 mg (has no administration in time range)    Or  LORazepam (ATIVAN) tablet 0-4 mg (has no administration in time range)  thiamine tablet 100 mg (has no administration in time range)    Or  thiamine (B-1) injection 100 mg (has no administration in time range)  chlordiazePOXIDE (LIBRIUM) capsule 100 mg (has no administration in time range)    Vitals:   04/23/20 1743  Weight: 84.4 kg  Height: 5\' 11"  (1.803 m)    Final diagnoses:  Right sided weakness    Admission/ observation were discussed with the admitting physician, patient and/or family and they are comfortable with the plan.   Final Clinical Impression(s) / ED Diagnoses Final diagnoses:  Right sided weakness    Rx / DC Orders ED Discharge Orders    None       Deno Etienne, DO 04/23/20 1920

## 2020-04-23 NOTE — Code Documentation (Signed)
Patient was LSN around 1230 after driving to [facility]. Afterwards he was seen "wandering around." EMS was called and found pt with right facial droop and AMS. BP 220/100. Code Stroke called and pt taken to [facility] Stroke team and EDP met patient on arrival and cleared him for CT. Pt with an NIHSS of 3 (see documentation). Pt does claim he drinks a "few ounces of Vodka" weekly but has not had any in three days. CT completed and negative for hemorrhage (see results below) but with symptoms improving and a question about LKW times TPA was not given. There is no LVO suspected based off of the exam and per MD findings. Pt will be admitted and followed with q2x12 vitals/neuro checks then q4. MRI also ordered at this time. Hand off with [facility].   IMPRESSION: 1. New ill-defined hypodensity left lenticular nucleus suspicious for subacute infarct. 2. New hypodensity in the left paramedian pons consistent with acute infarction which is likely chronic but could be recent. 3. Negative for hemorrhage 4. ASPECTS is 9  Brode Sculley, Rande Brunt, RN

## 2020-04-23 NOTE — ED Notes (Signed)
Patient given apple juice by this rn. Will re check CBG.
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
## 2020-04-23 NOTE — ED Triage Notes (Addendum)
[facility]. Per [employer] employees, pt had arrived at [facility] around 1230 this afternoon and had been wandering in the area since that time.   EMS called code stroke for right sided facial droop and AMS.   Pt reports drinking a couple of oz of vodka a day but has not drank in 3 days.

## 2020-04-23 NOTE — ED Notes (Signed)
Pt off floor to MRI

## 2020-04-24 ENCOUNTER — Observation Stay (HOSPITAL_COMMUNITY): Payer: Medicare Other

## 2020-04-24 DIAGNOSIS — Z8546 Personal history of malignant neoplasm of prostate: Secondary | ICD-10-CM | POA: Diagnosis not present

## 2020-04-24 DIAGNOSIS — I340 Nonrheumatic mitral (valve) insufficiency: Secondary | ICD-10-CM

## 2020-04-24 DIAGNOSIS — R29810 Facial weakness: Secondary | ICD-10-CM | POA: Diagnosis present

## 2020-04-24 DIAGNOSIS — H35033 Hypertensive retinopathy, bilateral: Secondary | ICD-10-CM | POA: Diagnosis present

## 2020-04-24 DIAGNOSIS — J449 Chronic obstructive pulmonary disease, unspecified: Secondary | ICD-10-CM | POA: Diagnosis present

## 2020-04-24 DIAGNOSIS — K219 Gastro-esophageal reflux disease without esophagitis: Secondary | ICD-10-CM | POA: Diagnosis present

## 2020-04-24 DIAGNOSIS — I6389 Other cerebral infarction: Secondary | ICD-10-CM

## 2020-04-24 DIAGNOSIS — E1169 Type 2 diabetes mellitus with other specified complication: Secondary | ICD-10-CM | POA: Diagnosis present

## 2020-04-24 DIAGNOSIS — Z7289 Other problems related to lifestyle: Secondary | ICD-10-CM | POA: Diagnosis not present

## 2020-04-24 DIAGNOSIS — R531 Weakness: Secondary | ICD-10-CM | POA: Diagnosis present

## 2020-04-24 DIAGNOSIS — E119 Type 2 diabetes mellitus without complications: Secondary | ICD-10-CM | POA: Diagnosis not present

## 2020-04-24 DIAGNOSIS — E11319 Type 2 diabetes mellitus with unspecified diabetic retinopathy without macular edema: Secondary | ICD-10-CM | POA: Diagnosis not present

## 2020-04-24 DIAGNOSIS — I10 Essential (primary) hypertension: Secondary | ICD-10-CM | POA: Diagnosis not present

## 2020-04-24 DIAGNOSIS — Z720 Tobacco use: Secondary | ICD-10-CM | POA: Diagnosis not present

## 2020-04-24 DIAGNOSIS — R1310 Dysphagia, unspecified: Secondary | ICD-10-CM | POA: Diagnosis present

## 2020-04-24 DIAGNOSIS — K5909 Other constipation: Secondary | ICD-10-CM | POA: Diagnosis not present

## 2020-04-24 DIAGNOSIS — F17210 Nicotine dependence, cigarettes, uncomplicated: Secondary | ICD-10-CM | POA: Diagnosis present

## 2020-04-24 DIAGNOSIS — G8191 Hemiplegia, unspecified affecting right dominant side: Secondary | ICD-10-CM | POA: Diagnosis present

## 2020-04-24 DIAGNOSIS — I152 Hypertension secondary to endocrine disorders: Secondary | ICD-10-CM | POA: Diagnosis present

## 2020-04-24 DIAGNOSIS — Z20822 Contact with and (suspected) exposure to covid-19: Secondary | ICD-10-CM | POA: Diagnosis present

## 2020-04-24 DIAGNOSIS — E113293 Type 2 diabetes mellitus with mild nonproliferative diabetic retinopathy without macular edema, bilateral: Secondary | ICD-10-CM | POA: Diagnosis present

## 2020-04-24 DIAGNOSIS — E876 Hypokalemia: Secondary | ICD-10-CM | POA: Diagnosis not present

## 2020-04-24 DIAGNOSIS — R29703 NIHSS score 3: Secondary | ICD-10-CM | POA: Diagnosis present

## 2020-04-24 DIAGNOSIS — F1010 Alcohol abuse, uncomplicated: Secondary | ICD-10-CM | POA: Diagnosis present

## 2020-04-24 DIAGNOSIS — I639 Cerebral infarction, unspecified: Secondary | ICD-10-CM | POA: Diagnosis not present

## 2020-04-24 DIAGNOSIS — E785 Hyperlipidemia, unspecified: Secondary | ICD-10-CM | POA: Diagnosis present

## 2020-04-24 DIAGNOSIS — E1165 Type 2 diabetes mellitus with hyperglycemia: Secondary | ICD-10-CM | POA: Diagnosis not present

## 2020-04-24 DIAGNOSIS — E1159 Type 2 diabetes mellitus with other circulatory complications: Secondary | ICD-10-CM | POA: Diagnosis not present

## 2020-04-24 DIAGNOSIS — Z9183 Wandering in diseases classified elsewhere: Secondary | ICD-10-CM | POA: Diagnosis not present

## 2020-04-24 DIAGNOSIS — I2510 Atherosclerotic heart disease of native coronary artery without angina pectoris: Secondary | ICD-10-CM | POA: Diagnosis present

## 2020-04-24 DIAGNOSIS — R4189 Other symptoms and signs involving cognitive functions and awareness: Secondary | ICD-10-CM | POA: Diagnosis present

## 2020-04-24 DIAGNOSIS — I6381 Other cerebral infarction due to occlusion or stenosis of small artery: Secondary | ICD-10-CM | POA: Diagnosis present

## 2020-04-24 DIAGNOSIS — N179 Acute kidney failure, unspecified: Secondary | ICD-10-CM | POA: Diagnosis present

## 2020-04-24 LAB — LIPID PANEL
Cholesterol: 141 mg/dL (ref 0–200)
HDL: 38 mg/dL — ABNORMAL LOW (ref >40)
LDL Cholesterol: 82 mg/dL (ref 0–99)
Total CHOL/HDL Ratio: 3.7 RATIO
Triglycerides: 105 mg/dL (ref <150)
VLDL: 21 mg/dL (ref 0–40)

## 2020-04-24 LAB — HEMOGLOBIN A1C
Hgb A1c MFr Bld: 6.5 % — ABNORMAL HIGH (ref 4.8–5.6)
Mean Plasma Glucose: 139.85 mg/dL

## 2020-04-24 LAB — ECHOCARDIOGRAM COMPLETE
Area-P 1/2: 2.1 cm2
Height: 71 in
S' Lateral: 3.3 cm
Weight: 2977.09 oz

## 2020-04-24 LAB — GLUCOSE, CAPILLARY
Glucose-Capillary: 135 mg/dL — ABNORMAL HIGH (ref 70–99)
Glucose-Capillary: 157 mg/dL — ABNORMAL HIGH (ref 70–99)
Glucose-Capillary: 175 mg/dL — ABNORMAL HIGH (ref 70–99)
Glucose-Capillary: 86 mg/dL (ref 70–99)

## 2020-04-24 MED ORDER — ENOXAPARIN SODIUM 40 MG/0.4ML ~~LOC~~ SOLN
40.0000 mg | SUBCUTANEOUS | Status: DC
  Administered 2020-04-24 – 2020-05-07 (×14): 40 mg via SUBCUTANEOUS
  Filled 2020-04-24 (×14): qty 0.4

## 2020-04-24 MED ORDER — AMLODIPINE BESYLATE 10 MG PO TABS: 10.0000 mg | ORAL_TABLET | Freq: Every day | ORAL | Status: DC

## 2020-04-24 MED ORDER — LOSARTAN POTASSIUM-HCTZ 100-12.5 MG PO TABS: 1.0000 | ORAL_TABLET | Freq: Every day | ORAL | Status: DC

## 2020-04-24 NOTE — Consult Note (Addendum)
Physical Medicine and Rehabilitation Consult Reason for Consult: Right side weakness Referring Physician: [facility]  HPI: Gregory Nunez. is a 75 y.o. right-handed male with history of diabetes mellitus and retinopathy hypertension hyperlipidemia prostate cancer as well as history of alcohol use and tobacco.  History taken from chart review, niece, and patient.  Patient with alone.  Eighth floor apartment elevator access.  Independent without assistive device and still driving.  He presented on 04/23/2020 with right hemiparesis.  He was found by bystanders with AMS, walking around the parking lot.  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  Patient did not receive TPA.  Angiogram head and neck negative for large vessel occlusion.  Echocardiogram with injection fraction of 60-65% with mild concentric left ventricular hypertrophy. Admission chemistries unremarkable except glucose 110, BUN 38, creatinine 2.35.  Urine drug screen negative.  CIR recommended due to deficits with mobility, transfers, self-care.  Review of Systems  Constitutional: Negative for chills and fever.  HENT: Negative for hearing loss.   Eyes: Negative for blurred vision and double vision.  Respiratory: Negative for cough.        SOB with exertion  Cardiovascular: Negative for chest pain, palpitations and leg swelling.  Gastrointestinal:       GERD  Musculoskeletal: Negative for back pain and myalgias.  Skin: Negative for rash.  Neurological: Positive for sensory change, speech change, focal weakness and weakness.  Psychiatric/Behavioral: Positive for depression. The patient has insomnia.   All other systems reviewed and are negative.  Past Medical History:  Diagnosis Date  . Cancer [facility])    prostate  . Cataract    OD  . Depression   . Diabetes mellitus without  complication ([place])   . Diabetic retinopathy ([place])    NPDR OU  . GERD (gastroesophageal reflux disease)    if drinks alcohol  . 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 Hyperlipidemia   . Hypertension   . Hypertensive retinopathy    OU  . Malignant neoplasm of prostate ([place]) 01/09/2014  . Pancreatitis   . Prostate cancer ([place]) 12/19/13   Gleason 4+3=7, volume 31.31 cc  . Shortness of breath dyspnea    with exertion    Past Surgical History:  Procedure Laterality Date  . BIOPSY  04/16/2018   Procedure: BIOPSY;  Surgeon: Juanita Craver, MD;  Location: [facility] ENDOSCOPY;  Service: Endoscopy;;  . biopsy on throat     hx of   . CATARACT EXTRACTION Bilateral    Dr. Quentin Ore  . ESOPHAGOGASTRODUODENOSCOPY Left 04/16/2018   Procedure: ESOPHAGOGASTRODUODENOSCOPY (EGD);  Surgeon: Juanita Craver, MD;  Location: [facility];  Service: Endoscopy;  Laterality: Left;  . EYE SURGERY    . FOOT SURGERY    . HOT HEMOSTASIS N/A 04/16/2018   Procedure: HOT HEMOSTASIS (ARGON PLASMA COAGULATION/BICAP);  Surgeon: Juanita Craver, MD;  Location: [facility];  Service: Endoscopy;  Laterality: N/A;  . LYMPHADENECTOMY Bilateral 02/27/2014   Procedure: BILATERAL LYMPHADENECTOMY;  Surgeon: Alexis Frock, MD;  Location: [facility] ORS;  Service: Urology;  Laterality: Bilateral;  . PROSTATE BIOPSY  12/2013   Gleason 4+3=7, volume 31.31 cc  . ROBOT ASSISTED LAPAROSCOPIC RADICAL PROSTATECTOMY N/A 02/27/2014   Procedure: ROBOTIC ASSISTED LAPAROSCOPIC RADICAL PROSTATECTOMY WITH INDOCYANINE GREEN DYE;  Surgeon: Alexis Frock, MD;  Location: [facility] ORS;  Service: Urology;  Laterality: N/A;   Family History  Problem Relation Age of Onset  . Heart disease Mother   . Cancer Sister        breast  . Colon cancer Neg Hx   . Esophageal cancer Neg Hx   . Rectal cancer Neg Hx   . Stomach cancer Neg Hx    Social History:  reports that he has been smoking. He has  been smoking about 0.50 packs per day. He has never used smokeless tobacco. He reports previous alcohol use. He reports previous drug use. Drugs: Marijuana and Cocaine. Allergies:  Allergies  Allergen Reactions  . Penicillins     Nervous   . Penicillins Anxiety and Other (See Comments)    DID THE REACTION INVOLVE: Swelling of the face/tongue/throat, SOB, or low BP? No Sudden or severe rash/hives, skin peeling, or the inside of the mouth or nose? No Did it require medical treatment? No When did it last happen?many years ago If all above answers are "NO", may proceed with cephalosporin use.      Medications Prior to Admission  Medication Sig Dispense Refill  . albuterol (VENTOLIN HFA) 108 (90 Base) MCG/ACT inhaler Inhale 2 puffs into the lungs every 6 (six) hours as needed for wheezing or shortness of breath.    Marland Kitchen amLODipine (NORVASC) 10 MG tablet Take 10 mg by mouth daily.    . metFORMIN (GLUCOPHAGE) 500 MG tablet Take 500 mg by mouth 2 (two) times daily.    . traZODone (DESYREL) 50 MG tablet Take 50 mg by mouth at bedtime as needed for sleep.    Marland Kitchen HYDROcodone-acetaminophen (NORCO/VICODIN) 5-325 MG tablet Take 1-2 tablets by mouth every 6 (six) hours as needed. (Patient not taking: No sig reported) 10 tablet 0  . losartan (COZAAR) 100 MG tablet Take 100 mg by mouth every evening.  (Patient not taking: Reported on 04/24/2020)    . losartan-hydrochlorothiazide (HYZAAR) 100-12.5 MG tablet Take 1 tablet by mouth daily.    . methocarbamol (ROBAXIN) 500 MG tablet Take 1 tablet (500 mg total) by mouth 3 (three) times daily. (Patient not taking: No sig reported) 30 tablet 0  . pantoprazole (PROTONIX) 40 MG tablet Take 1 tablet (40 mg total) by mouth daily. 30 tablet 1  . predniSONE (DELTASONE) 20 MG tablet Take 2 tablets daily with breakfast. (Patient not taking: Reported on 04/24/2020) 10 tablet 0  . traMADol (ULTRAM) 50 MG tablet Take 1 tablet (50 mg total) by mouth every 6 (six) hours as  needed. (Patient not taking: No sig reported) 15 tablet 0    Home: Home Living Family/patient expects to be discharged to:: Private residence (apartment) Living Arrangements: Alone Available Help at Discharge: Family,Friend(s),Available 24 hours/day Type of Home: Apartment Home Access: Level entry,Elevator Home Layout: One level Bathroom Shower/Tub: Chiropodist: Standard Home Equipment: Grab bars - tub/shower,Grab bars - toilet Additional Comments: High rise 8th floor apartment with elevator access  Functional History: Prior Function Level of Independence: Independent Comments: Independent with ADLs/IADLs without AD. Patient was still driving. Eats out most of the time but doesn't do much cooking. Functional Status:  Mobility: Bed Mobility Overal bed mobility: Needs Assistance Bed Mobility: Supine to Sit Supine to sit: Min guard,HOB elevated Sit to supine: Min guard General bed mobility comments: Ues of bed rails and HOB elevated to come to sit EOB with extra time and effort, min guard for safety. Transfers Overall transfer level: Needs assistance Equipment used: Rolling walker (2 wheeled) Transfers:  Sit to/from [employer] Sit to Stand: Min assist Stand pivot transfers: Mod assist  Lateral/Scoot Transfers: Min guard General transfer comment: Sit to stand from EOB with cues for hand placement with minA and extra time and effort to power up. ModA for stability and to cue pt to sequence steps and manage RW to step to transfer to recliner, intermittent LOB with modA to recover. Ambulation/Gait Ambulation/Gait assistance: Mod assist Gait Distance (Feet): 8 Feet Assistive device: Rolling walker (2 wheeled) Gait Pattern/deviations: Decreased step length - right,Decreased stride length,Decreased weight shift to left,Shuffle,Leaning posteriorly,Step-to pattern,Decreased weight shift to right General Gait Details: Ambulates at slow pace with trunk  sway and knee instability without buckling noted. Decreased bilat step length, esp on R with step-to gait, with evident shuffling, despite cues to correct. Several bouts of LOB when taking steps requiring modA to recover, otherwise minA for steadying when no LOB. Poor awareness of location of chair, despite cues, attemtping to sit prior to reaching chair. Gait velocity: reduced Gait velocity interpretation: <1.31 ft/sec, indicative of household ambulator    ADL: ADL Overall ADL's : Needs assistance/impaired Eating/Feeding: Set up,Sitting Eating/Feeding Details (indicate cue type and reason): Difficulty opening food containers/packets. Able to spear food on plate and bring to mouth with dominant R hand. Requires increased time. Grooming: Moderate assistance,Standing Grooming Details (indicate cue type and reason): modA stand at sink level; Upper Body Dressing : Minimal assistance,Sitting Upper Body Dressing Details (indicate cue type and reason): To don hospital gown. Lower Body Dressing: Moderate assistance,Sit to/from stand Lower Body Dressing Details (indicate cue type and reason): Mod A to doff/don footwear seated EOB. Patient attemptes to use affected RUE but difficulty noted 2/2 decreased coordination/strength. General ADL Comments: Deferred functional mobility 2/2 elevated BP. Please refer to values below.  Cognition: Cognition Overall Cognitive Status: Impaired/Different from baseline Orientation Level: Oriented to place,Oriented to person,Disoriented to time,Disoriented to situation Attention: Sustained Sustained Attention: Impaired Sustained Attention Impairment: Verbal basic,Functional basic Memory: Impaired Memory Impairment: Decreased recall of new information,Retrieval deficit,Decreased short term memory Decreased Short Term Memory: Verbal basic,Functional basic Problem Solving: Impaired Problem Solving Impairment: Verbal basic,Functional basic Behaviors:  Perseveration Cognition Arousal/Alertness: Awake/alert Behavior During Therapy: Agitated,Impulsive Overall Cognitive Status: Impaired/Different from baseline Area of Impairment: Attention,Following commands,Safety/judgement,Memory,Problem solving,Awareness,Orientation Orientation Level: Disoriented to,Time (stated month was September) Current Attention Level: Sustained Memory: Decreased short-term memory Following Commands: Follows one step commands consistently,Follows one step commands with increased time,Follows multi-step commands inconsistently Safety/Judgement: Decreased awareness of safety,Decreased awareness of deficits Awareness: Emergent Problem Solving: Slow processing,Difficulty sequencing,Requires verbal cues General Comments: Came into room initially due to pt impulsively trying to get OOB, x2 reps, despite cues otherwise. Pt perseverating on calling nephew and going home and getting agitated with idea of staying in hospital longer. Poor word finding, having to change statements or numbers often. STM deficits and poor safety awareness and awareness of deficits as pt required repeated cues and extra time to proccess the cues to respond correctly and even attempting to avoid recognizing his difficulty with ambulating and his decreased balance stating he would not fall at home yet he would lose balance during session.  Blood pressure (!) 153/95, pulse (!) 107, temperature 98.1 F (36.7 C), temperature source Oral, resp. rate 18, height 5\' 11"  (1.803 m), weight 84.4 kg, SpO2 100 %. Physical Exam Vitals reviewed.  Constitutional:      General: He is not in acute distress.    Appearance: He is normal weight. He is not ill-appearing.  HENT:  Head: Normocephalic and atraumatic.     Right Ear: External ear normal.     Left Ear: External ear normal.     Nose: Nose normal.  Eyes:     General:        Right eye: No discharge.        Left eye: No discharge.     Extraocular  Movements: Extraocular movements intact.  Cardiovascular:     Rate and Rhythm: Regular rhythm. Tachycardia present.  Pulmonary:     Effort: Pulmonary effort is normal. No respiratory distress.     Breath sounds: No stridor.  Abdominal:     General: Abdomen is flat. Bowel sounds are normal. There is no distension.  Musculoskeletal:     Cervical back: Normal range of motion and neck supple.     Comments: No edema or tenderness in extremities  Skin:    General: Skin is warm and dry.  Neurological:     Mental Status: He is alert.     Comments: Alert No acute distress.   Makes eye contact with examiner.   Provides name and age but limited medical historian. Motor: Right upper extremity: 4+/5 proximal distal Right lower extremity: 4 --4/5 proximal distal Left upper extremity/left lower extremity: 5/5 proximal distal Sensation intact light touch Dysarthria Right facial weakness  Psychiatric:        Mood and Affect: Mood is anxious.        Speech: Speech is slurred and tangential.     Results for orders placed or performed during the hospital encounter of 04/23/20 (from the past 24 hour(s))  Glucose, capillary     Status: Abnormal   Collection Time: 04/24/20 12:33 PM  Result Value Ref Range   Glucose-Capillary 175 (H) 70 - 99 mg/dL   Comment 1 Notify RN    Comment 2 Document in Chart   Glucose, capillary     Status: Abnormal   Collection Time: 04/24/20  4:14 PM  Result Value Ref Range   Glucose-Capillary 157 (H) 70 - 99 mg/dL   Comment 1 Notify RN    Comment 2 Document in Chart   Glucose, capillary     Status: Abnormal   Collection Time: 04/24/20  9:55 PM  Result Value Ref Range   Glucose-Capillary 135 (H) 70 - 99 mg/dL   Comment 1 Notify RN    Comment 2 Document in Chart   Basic metabolic panel     Status: Abnormal   Collection Time: 04/25/20  2:09 AM  Result Value Ref Range   Sodium 140 135 - 145 mmol/L   Potassium 3.2 (L) 3.5 - 5.1 mmol/L   Chloride 108 98 - 111  mmol/L   CO2 28 22 - 32 mmol/L   Glucose, Bld 110 (H) 70 - 99 mg/dL   BUN 27 (H) 8 - 23 mg/dL   Creatinine, Ser 0.86 (H) 0.61 - 1.24 mg/dL   Calcium 8.6 (L) 8.9 - 10.3 mg/dL   GFR, Estimated 44 (L) >60 mL/min   Anion gap 4 (L) 5 - 15  Magnesium     Status: None   Collection Time: 04/25/20  2:09 AM  Result Value Ref Range   Magnesium 1.8 1.7 - 2.4 mg/dL  Glucose, capillary     Status: None   Collection Time: 04/25/20  6:46 AM  Result Value Ref Range   Glucose-Capillary 88 70 - 99 mg/dL   Comment 1 Notify RN    Comment 2 Document in Chart   Glucose, capillary  Status: Abnormal   Collection Time: 04/25/20 11:46 AM  Result Value Ref Range   Glucose-Capillary 170 (H) 70 - 99 mg/dL   CT HEAD WO CONTRAST  Result Date: 04/24/2020 CLINICAL DATA:  Follow-up examination for acute stroke. EXAM: CT HEAD WITHOUT CONTRAST TECHNIQUE: Contiguous axial images were obtained from the base of the skull through the vertex without intravenous contrast. COMPARISON:  Prior MRI from 04/23/2020. FINDINGS: Brain: Previously identified approximate 3 cm acute ischemic infarct involving the left basal ganglia again seen, stable in size and distribution. No hemorrhagic transformation or other complication. No significant mass effect. Previously noted additional subcentimeter subacute infarct involving the right periatrial white matter not well visualized by CT. No other new acute intracranial process. No hemorrhage or new large vessel territory infarct. No mass lesion, midline shift or mass effect. No hydrocephalus or extra-axial fluid collection. Atrophy with chronic microvascular ischemic disease again noted. Remote pontine infarct again noted as well. Vascular: No visible hyperdense vessel. Calcified atherosclerosis at the skull base. Skull: Scalp soft tissues and calvarium within normal limits. Sinuses/Orbits: Globes and orbital soft tissues demonstrate no acute finding. Paranasal sinuses remain clear. No significant  mastoid effusion. Other: None. IMPRESSION: 1. No significant interval change in size and distribution of ischemic infarct involving the left basal ganglia. No hemorrhagic transformation or other complication. 2. Previously identified subcentimeter subacute right periatrial infarct not well visualized by CT. 3. No other new acute intracranial abnormality. Electronically Signed   By: Jeannine Boga M.D.   On: 04/24/2020 04:47   MR ANGIO HEAD WO CONTRAST  Result Date: 04/23/2020 CLINICAL DATA:  Initial evaluation for acute stroke. EXAM: MRI HEAD WITHOUT CONTRAST MRA HEAD WITHOUT CONTRAST MRA NECK WITHOUT AND WITH CONTRAST TECHNIQUE: Multiplanar, multiecho pulse sequences of the brain and surrounding structures were obtained without intravenous contrast. Angiographic images of the Circle of Willis were obtained using MRA technique without intravenous contrast. Angiographic images of the neck were obtained using MRA technique without and with intravenous contrast. Carotid stenosis measurements (when applicable) are obtained utilizing NASCET criteria, using the distal internal carotid diameter as the denominator. CONTRAST:  54mL GADAVIST GADOBUTROL 1 MMOL/ML IV SOLN COMPARISON:  Prior head CT from earlier the same day. FINDINGS: MRI HEAD FINDINGS Brain: Examination degraded by motion artifact. Diffuse prominence of the CSF containing spaces compatible generalized age-related cerebral atrophy. Patchy and confluent T2/FLAIR hyperintensity within the periventricular and deep white matter both cerebral hemispheres most consistent with chronic small vessel ischemic disease. Patchy involvement of the pons noted. Superimposed remote lacunar infarct present at the left paramedian pons. Additional small remote lacunar infarct present at the posterior right frontoparietal corona radiata (series 15, image 17). Approximate 3 cm focus of curvilinear restricted diffusion seen involving the left basal ganglia, extending from the  left lentiform nucleus/subinsular white matter to the left caudate, consistent with acute ischemic infarct. Finding corresponds with abnormality on prior CT. Probable associated trace petechial hemorrhage (series 11, image 30). No frank hemorrhagic transformation or significant regional mass effect. An additional 5 mm focus of mild diffusion abnormality noted within the right periatrial white matter, consistent with a small subacute small vessel type infarct (series 5, image 71). No associated hemorrhage or mass effect. No other diffusion abnormality to suggest acute or subacute ischemia. Gray-white matter differentiation otherwise maintained. No acute intracranial hemorrhage. Subcentimeter chronic microhemorrhage noted at the posterior left temporal occipital region, of doubtful significance isolation (series 11, image 29). No mass lesion, midline shift or mass effect. No hydrocephalus or extra-axial fluid  collection. Pituitary gland suprasellar region normal. Midline structures intact. Vascular: Major intracranial vascular flow voids are maintained. Skull and upper cervical spine: Craniocervical junction normal. Bone marrow signal intensity within normal limits. No scalp soft tissue abnormality. Sinuses/Orbits: Patient status post bilateral ocular lens replacement. Globes and orbital soft tissues demonstrate no acute finding. Paranasal sinuses are largely clear. No significant mastoid effusion. Inner ear structures grossly normal. Other: None. MRA HEAD FINDINGS ANTERIOR CIRCULATION: [place] examination degraded by motion artifact. Visualized distal cervical segments of the internal carotid arteries are somewhat tortuous but patent with antegrade flow. Petrous, cavernous, and supraclinoid segments widely patent without appreciable stenosis. A1 segments patent bilaterally. Normal anterior communicating artery complex. Anterior cerebral arteries patent to their distal aspects without appreciable stenosis. No M1  stenosis or occlusion. Normal left MCA bifurcation. Right MCA trifurcations. Distal MCA branches well perfused and grossly symmetric. POSTERIOR CIRCULATION: Left V4 segment dominant. Mild atheromatous narrowing of the distal left V4 segment just prior to the vertebrobasilar junction. Left PICA origin patent and grossly normal. Right V4 segment patent without stenosis. Right PICA origin not seen. 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 Basilar otherwise widely patent. Superior cerebellar arteries grossly patent bilaterally. Both PCAs primarily supplied via the basilar, although a small right posterior communicating artery noted. PCAs well perfused to their distal aspects without appreciable stenosis. No aneurysm. MRA NECK FINDINGS AORTIC ARCH: Visualized aortic arch of normal caliber with normal 3 vessel morphology. No hemodynamically significant stenosis seen about the origin of the great vessels. Visualized subclavian arteries widely patent. RIGHT CAROTID SYSTEM: Right common and internal carotid arteries widely patent without stenosis, evidence for dissection, or occlusion. No significant atheromatous irregularity or narrowing about the right bifurcation. LEFT CAROTID SYSTEM: Left common and internal carotid arteries widely patent without stenosis, evidence for dissection or occlusion. No significant atheromatous narrowing or irregularity about the left carotid bifurcation. VERTEBRAL ARTERIES: Both vertebral arteries arise from the subclavian arteries. No proximal subclavian artery stenosis. Left vertebral artery dominant. Single short-segment moderate stenosis at the pre foraminal left V1 segment (series 1021, image 13). Vertebral arteries otherwise widely patent within the neck without stenosis, evidence for dissection or occlusion. IMPRESSION: MRI HEAD IMPRESSION: 1. 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 2. Additional 5 mm subacute ischemic nonhemorrhagic right periatrial white matter infarct. 3. 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 MRA HEAD IMPRESSION: 1. Motion degraded exam. 2. Negative intracranial MRA for large vessel occlusion. 3. 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 No other hemodynamically significant or correctable stenosis identified. MRA NECK IMPRESSION: 1. Wide patency of both carotid artery systems within the neck. 2. Single short-segment moderate stenosis involving the pre foraminal left V1 segment. Vertebral arteries otherwise widely patent within the neck. Left vertebral artery dominant. Electronically Signed   By: Jeannine Boga M.D.   On: 04/23/2020 21:43   MR ANGIO NECK W WO CONTRAST  Result Date: 04/23/2020 CLINICAL DATA:  Initial evaluation for acute stroke. EXAM: MRI HEAD WITHOUT CONTRAST MRA HEAD WITHOUT CONTRAST MRA NECK WITHOUT AND WITH CONTRAST TECHNIQUE: Multiplanar, multiecho pulse sequences of the brain and surrounding structures were obtained without intravenous contrast. Angiographic images of the Circle of Willis were obtained using MRA technique without intravenous contrast. Angiographic images of the neck were obtained using MRA technique without  and with intravenous contrast. Carotid stenosis measurements (when applicable) are obtained utilizing NASCET criteria, using the distal internal carotid diameter as the denominator. CONTRAST:  60mL GADAVIST GADOBUTROL 1 MMOL/ML IV SOLN COMPARISON:  Prior head CT from earlier the same day. FINDINGS: MRI HEAD FINDINGS Brain: Examination degraded by motion artifact. Diffuse prominence of the CSF containing spaces compatible generalized age-related cerebral atrophy. Patchy and confluent T2/FLAIR  hyperintensity within the periventricular and deep white matter both cerebral hemispheres most consistent with chronic small vessel ischemic disease. Patchy involvement of the pons noted. Superimposed remote lacunar infarct present at the left paramedian pons. Additional small remote lacunar infarct present at the posterior right frontoparietal corona radiata (series 15, image 17). Approximate 3 cm focus of curvilinear restricted diffusion seen involving the left basal ganglia, extending from the left lentiform nucleus/subinsular white matter to the left caudate, consistent with acute ischemic infarct. Finding corresponds with abnormality on prior CT. Probable associated trace petechial hemorrhage (series 11, image 30). No frank hemorrhagic transformation or significant regional mass effect. An additional 5 mm focus of mild diffusion abnormality noted within the right periatrial white matter, consistent with a small subacute small vessel type infarct (series 5, image 71). No associated hemorrhage or mass effect. No other diffusion abnormality to suggest acute or subacute ischemia. Gray-white matter differentiation otherwise maintained. No acute intracranial hemorrhage. Subcentimeter chronic microhemorrhage noted at the posterior left temporal occipital region, of doubtful significance isolation (series 11, image 29). No mass lesion, midline shift or mass effect. No hydrocephalus or extra-axial fluid collection. Pituitary gland suprasellar region normal. Midline structures intact. Vascular: Major intracranial vascular flow voids are maintained. Skull and upper cervical spine: Craniocervical junction normal. Bone marrow signal intensity within normal limits. No scalp soft tissue abnormality. Sinuses/Orbits: Patient status post bilateral ocular lens replacement. Globes and orbital soft tissues demonstrate no acute finding. Paranasal sinuses are largely clear. No significant mastoid effusion. Inner ear structures  grossly normal. Other: None. MRA HEAD FINDINGS ANTERIOR CIRCULATION: Emanation examination degraded by motion artifact. Visualized distal cervical segments of the internal carotid arteries are somewhat tortuous but patent with antegrade flow. Petrous, cavernous, and supraclinoid segments widely patent without appreciable stenosis. A1 segments patent bilaterally. Normal anterior communicating artery complex. Anterior cerebral arteries patent to their distal aspects without appreciable stenosis. No M1 stenosis or occlusion. Normal left MCA bifurcation. Right MCA trifurcations. Distal MCA branches well perfused and grossly symmetric. POSTERIOR CIRCULATION: Left V4 segment dominant. Mild atheromatous narrowing of the distal left V4 segment just prior to the vertebrobasilar junction. Left PICA origin patent and grossly normal. Right V4 segment patent without stenosis. Right PICA origin not seen. 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 Basilar otherwise widely patent. Superior cerebellar arteries grossly patent bilaterally. Both PCAs primarily supplied via the basilar, although a small right posterior communicating artery noted. PCAs well perfused to their distal aspects without appreciable stenosis. No aneurysm. MRA NECK FINDINGS AORTIC ARCH: Visualized aortic arch of normal caliber with normal 3 vessel morphology. No hemodynamically significant stenosis seen about the origin of the great vessels. Visualized subclavian arteries widely patent. RIGHT CAROTID SYSTEM: Right common and internal carotid arteries widely patent without stenosis, evidence for dissection, or occlusion. No significant atheromatous irregularity or narrowing about the right bifurcation. LEFT CAROTID SYSTEM: Left common and internal carotid arteries widely patent without stenosis, evidence for dissection or occlusion. No significant atheromatous narrowing or irregularity about the left carotid  bifurcation. VERTEBRAL ARTERIES: Both vertebral arteries arise from the  subclavian arteries. No proximal subclavian artery stenosis. Left vertebral artery dominant. Single short-segment moderate stenosis at the pre foraminal left V1 segment (series 1021, image 13). Vertebral arteries otherwise widely patent within the neck without stenosis, evidence for dissection or occlusion. IMPRESSION: MRI HEAD IMPRESSION: 1. 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 2. Additional 5 mm subacute ischemic nonhemorrhagic right periatrial white matter infarct. 3. 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 MRA HEAD IMPRESSION: 1. Motion degraded exam. 2. Negative intracranial MRA for large vessel occlusion. 3. 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 No other hemodynamically significant or correctable stenosis identified. MRA NECK IMPRESSION: 1. Wide patency of both carotid artery systems within the neck. 2. Single short-segment moderate stenosis involving the pre foraminal left V1 segment. Vertebral arteries otherwise widely patent within the neck. Left vertebral artery dominant. Electronically Signed   By: Jeannine Boga M.D.   On: 04/23/2020 21:43   MR BRAIN WO CONTRAST  Result Date: 04/23/2020 CLINICAL DATA:  Initial evaluation for acute stroke. EXAM: MRI HEAD WITHOUT CONTRAST MRA HEAD WITHOUT CONTRAST MRA NECK WITHOUT AND WITH CONTRAST TECHNIQUE: Multiplanar, multiecho pulse sequences of the brain and surrounding structures were obtained without intravenous contrast. Angiographic images of the Circle of Willis were obtained using MRA technique without intravenous contrast. Angiographic images of the neck were obtained using MRA technique  without and with intravenous contrast. Carotid stenosis measurements (when applicable) are obtained utilizing NASCET criteria, using the distal internal carotid diameter as the denominator. CONTRAST:  70mL GADAVIST GADOBUTROL 1 MMOL/ML IV SOLN COMPARISON:  Prior head CT from earlier the same day. FINDINGS: MRI HEAD FINDINGS Brain: Examination degraded by motion artifact. Diffuse prominence of the CSF containing spaces compatible generalized age-related cerebral atrophy. Patchy and confluent T2/FLAIR hyperintensity within the periventricular and deep white matter both cerebral hemispheres most consistent with chronic small vessel ischemic disease. Patchy involvement of the pons noted. Superimposed remote lacunar infarct present at the left paramedian pons. Additional small remote lacunar infarct present at the posterior right frontoparietal corona radiata (series 15, image 17). Approximate 3 cm focus of curvilinear restricted diffusion seen involving the left basal ganglia, extending from the left lentiform nucleus/subinsular white matter to the left caudate, consistent with acute ischemic infarct. Finding corresponds with abnormality on prior CT. Probable associated trace petechial hemorrhage (series 11, image 30). No frank hemorrhagic transformation or significant regional mass effect. An additional 5 mm focus of mild diffusion abnormality noted within the right periatrial white matter, consistent with a small subacute small vessel type infarct (series 5, image 71). No associated hemorrhage or mass effect. No other diffusion abnormality to suggest acute or subacute ischemia. Gray-white matter differentiation otherwise maintained. No acute intracranial hemorrhage. Subcentimeter chronic microhemorrhage noted at the posterior left temporal occipital region, of doubtful significance isolation (series 11, image 29). No mass lesion, midline shift or mass effect. No hydrocephalus or extra-axial fluid collection. Pituitary  gland suprasellar region normal. Midline structures intact. Vascular: Major intracranial vascular flow voids are maintained. Skull and upper cervical spine: Craniocervical junction normal. Bone marrow signal intensity within normal limits. No scalp soft tissue abnormality. Sinuses/Orbits: Patient status post bilateral ocular lens replacement. Globes and orbital soft tissues demonstrate no acute finding. Paranasal sinuses are largely clear. No significant mastoid effusion. Inner ear structures grossly normal. Other: None. MRA HEAD FINDINGS ANTERIOR CIRCULATION:  Emanation examination degraded by motion artifact. Visualized distal cervical segments of the internal carotid arteries are somewhat tortuous but patent with antegrade flow. Petrous, cavernous, and supraclinoid segments widely patent without appreciable stenosis. A1 segments patent bilaterally. Normal anterior communicating artery complex. Anterior cerebral arteries patent to their distal aspects without appreciable stenosis. No M1 stenosis or occlusion. Normal left MCA bifurcation. Right MCA trifurcations. Distal MCA branches well perfused and grossly symmetric. POSTERIOR CIRCULATION: Left V4 segment dominant. Mild atheromatous narrowing of the distal left V4 segment just prior to the vertebrobasilar junction. Left PICA origin patent and grossly normal. Right V4 segment patent without stenosis. Right PICA origin not seen. 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 Basilar otherwise widely patent. Superior cerebellar arteries grossly patent bilaterally. Both PCAs primarily supplied via the basilar, although a small right posterior communicating artery noted. PCAs well perfused to their distal aspects without appreciable stenosis. No aneurysm. MRA NECK FINDINGS AORTIC ARCH: Visualized aortic arch of normal caliber with normal 3 vessel morphology. No hemodynamically significant stenosis seen about the origin of  the great vessels. Visualized subclavian arteries widely patent. RIGHT CAROTID SYSTEM: Right common and internal carotid arteries widely patent without stenosis, evidence for dissection, or occlusion. No significant atheromatous irregularity or narrowing about the right bifurcation. LEFT CAROTID SYSTEM: Left common and internal carotid arteries widely patent without stenosis, evidence for dissection or occlusion. No significant atheromatous narrowing or irregularity about the left carotid bifurcation. VERTEBRAL ARTERIES: Both vertebral arteries arise from the subclavian arteries. No proximal subclavian artery stenosis. Left vertebral artery dominant. Single short-segment moderate stenosis at the pre foraminal left V1 segment (series 1021, image 13). Vertebral arteries otherwise widely patent within the neck without stenosis, evidence for dissection or occlusion. IMPRESSION: MRI HEAD IMPRESSION: 1. 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 2. Additional 5 mm subacute ischemic nonhemorrhagic right periatrial white matter infarct. 3. 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 MRA HEAD IMPRESSION: 1. Motion degraded exam. 2. Negative intracranial MRA for large vessel occlusion. 3. 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 No other hemodynamically significant or correctable stenosis identified. MRA NECK IMPRESSION: 1. Wide patency of both carotid artery systems within the neck. 2. Single short-segment moderate stenosis involving the pre foraminal left V1 segment. Vertebral arteries otherwise widely patent within the neck. Left vertebral artery dominant. Electronically Signed   By: Jeannine Boga M.D.   On: 04/23/2020 21:43    ECHOCARDIOGRAM COMPLETE  Result Date: 04/24/2020    ECHOCARDIOGRAM REPORT   Patient Name:   Gregory Nunez. Date of Exam: 04/24/2020 Medical Rec #:  [id number]               Height:       71.0 in Accession #:    [id number]              Weight:       186.1 lb Date of Birth:  1945/11/04               BSA:          2.045 m Patient Age:    89 years                BP:           166/95 mmHg Patient Gender: M  HR:           87 bpm. Exam Location:  Inpatient Procedure: 2D Echo, Cardiac Doppler and Color Doppler Indications:    Stroke 434.91 / I163.9  History:        Patient has no prior history of Echocardiogram examinations.                 Signs/Symptoms:Dyspnea; Risk Factors:Hypertension, Diabetes and                 Dyslipidemia. Cancer. GERD.  Sonographer:    Jonelle Sidle Dance Referring Phys: [id number] [place]  1. Left ventricular ejection fraction, by estimation, is 60 to 65%. The left ventricle has normal function. The left ventricle has no regional wall motion abnormalities. There is mild concentric left ventricular hypertrophy. Left ventricular diastolic parameters are consistent with Grade I diastolic dysfunction (impaired relaxation).  2. Right ventricular systolic function is normal. The right ventricular size is normal.  3. The mitral valve is normal in structure. Mild mitral valve regurgitation. No evidence of mitral stenosis.  4. The aortic valve is normal in structure. Aortic valve regurgitation is not visualized. No aortic stenosis is present.  5. The inferior vena cava is normal in size with greater than 50% respiratory variability, suggesting right atrial pressure of 3 mmHg. Conclusion(s)/Recommendation(s): No intracardiac source of embolism detected on this transthoracic study. A transesophageal echocardiogram is recommended to exclude cardiac source of embolism if clinically indicated. FINDINGS  Left Ventricle: Left ventricular ejection fraction, by  estimation, is 60 to 65%. The left ventricle has normal function. The left ventricle has no regional wall motion abnormalities. The left ventricular internal cavity size was normal in size. There is  mild concentric left ventricular hypertrophy. Left ventricular diastolic parameters are consistent with Grade I diastolic dysfunction (impaired relaxation). Normal left ventricular filling pressure. Right Ventricle: The right ventricular size is normal. No increase in right ventricular wall thickness. Right ventricular systolic function is normal. Left Atrium: Left atrial size was normal in size. Right Atrium: Right atrial size was normal in size. Pericardium: There is no evidence of pericardial effusion. Mitral Valve: The mitral valve is normal in structure. Mild mitral valve regurgitation. No evidence of mitral valve stenosis. Tricuspid Valve: The tricuspid valve is normal in structure. Tricuspid valve regurgitation is mild . No evidence of tricuspid stenosis. Aortic Valve: The aortic valve is normal in structure. Aortic valve regurgitation is not visualized. No aortic stenosis is present. Pulmonic Valve: The pulmonic valve was normal in structure. Pulmonic valve regurgitation is not visualized. No evidence of pulmonic stenosis. Aorta: The aortic root is normal in size and structure. Venous: The inferior vena cava is normal in size with greater than 50% respiratory variability, suggesting right atrial pressure of 3 mmHg. IAS/Shunts: No atrial level shunt detected by color flow Doppler.  LEFT VENTRICLE PLAX 2D LVIDd:         4.20 cm  Diastology LVIDs:         3.30 cm  LV e' medial:    6.64 cm/s LV PW:         1.10 cm  LV E/e' medial:  7.4 LV IVS:        1.10 cm  LV e' lateral:   6.96 cm/s LVOT diam:     2.20 cm  LV E/e' lateral: 7.0 LV SV:         63 LV SV Index:   31 LVOT Area:     3.80 cm  RIGHT  VENTRICLE            IVC RV Basal diam:  3.00 cm    IVC diam: 1.90 cm RV S prime:     8.49 cm/s TAPSE (M-mode): 1.7 cm  LEFT ATRIUM             Index       RIGHT ATRIUM           Index LA diam:        3.20 cm 1.56 cm/m  RA Area:     12.30 cm LA Vol (A2C):   67.1 ml 32.81 ml/m RA Volume:   28.30 ml  13.84 ml/m LA Vol (A4C):   53.9 ml 26.36 ml/m LA Biplane Vol: 62.0 ml 30.32 ml/m  AORTIC VALVE LVOT Vmax:   86.20 cm/s LVOT Vmean:  55.300 cm/s LVOT VTI:    0.166 m  AORTA Ao Root diam: 3.40 cm Ao Asc diam:  3.30 cm MITRAL VALVE MV Area (PHT): 2.10 cm    SHUNTS MV Decel Time: 361 msec    Systemic VTI:  0.17 m MV E velocity: 49.05 cm/s  Systemic Diam: 2.20 cm MV A velocity: 63.40 cm/s MV E/A ratio:  0.77 Ena Dawley MD Electronically signed by Ena Dawley MD Signature Date/Time: 04/24/2020/11:59:21 AM    Final    CT HEAD CODE STROKE WO CONTRAST  Result Date: 04/23/2020 CLINICAL DATA:  Code stroke. Acute neuro deficit. Right facial droop and confusion EXAM: CT HEAD WITHOUT CONTRAST TECHNIQUE: Contiguous axial images were obtained from the base of the skull through the vertex without intravenous contrast. COMPARISON:  CT head 11/07/2018 FINDINGS: Brain: New area of ill-defined hypodensity in the left lenticular nucleus likely a subacute infarct. New hypodensity in the left paramedian pons which is relatively well defined and likely is a chronic lacunar infarct but could be subacute. Generalized atrophy. Chronic microvascular ischemic changes in the white matter are relatively mild. Negative for hemorrhage or mass. Ventricle size normal. Vascular: Negative for hyperdense vessel Skull: Negative Sinuses/Orbits: Paranasal sinuses clear. Bilateral cataract extraction. Other: None ASPECTS ([place] Stroke Program Early CT Score) - Ganglionic level infarction (caudate, lentiform nuclei, internal capsule, insula, M1-M3 cortex): 6 - Supraganglionic infarction (M4-M6 cortex): 3 Total score (0-10 with 10 being normal): 9 IMPRESSION: 1. New ill-defined hypodensity left lenticular nucleus suspicious for subacute infarct. 2. New hypodensity  in the left paramedian pons consistent with acute infarction which is likely chronic but could be recent. 3. Negative for hemorrhage 4. ASPECTS is 9 5. Code stroke imaging results were communicated on 04/23/2020 at 5:36 pm to provider Lorrin Goodell via text page Electronically Signed   By: Franchot Gallo M.D.   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   Stroke: Continue secondary stroke prophylaxis and Risk Factor Modification listed below:   Antiplatelet therapy:   Blood Pressure Management:  Continue current medication with prn's with permisive HTN per primary team Statin Agent:   Diabetes management:   Tobacco abuse:   Right sided hemiparesis: PT/OT for mobility, ADL training  Motor recovery: Zoloft Labs independently reviewed.  Records reviewed and summated above.  1. Does the need for close, 24 hr/day medical supervision in concert with the patient's rehab needs make it unreasonable for this patient to be served in a less intensive setting? Yes  2. Co-Morbidities requiring supervision/potential complications: DM with retinopathy (Monitor in accordance with exercise  and adjust meds as necessary), HTN (monitor and provide prns in accordance with increased physical exertion and pain), hyperlipidemia, prostate cancer, tobacco abuse (counsel) as well as history of alcohol  3. Due to bladder management, safety, disease management, medication administration and patient education, does the patient require 24 hr/day rehab nursing? Yes 4. Does the patient require coordinated care of a physician, rehab nurse, therapy disciplines of PT/OT/SLP to address physical and functional deficits in the context of the above medical diagnosis(es)? Yes Addressing deficits in the following areas: balance, endurance, locomotion, strength, transferring, bathing,  dressing, toileting, cognition, speech and psychosocial support 5. Can the patient actively participate in an intensive therapy program of at least 3 hrs of therapy per day at least 5 days per week? Yes 6. The potential for patient to make measurable gains while on inpatient rehab is excellent 7. Anticipated functional outcomes upon discharge from inpatient rehab are supervision  with PT, supervision with OT, modified independent and supervision with SLP. 8. Estimated rehab length of stay to reach the above functional goals is: 14-18 days. 9. Anticipated discharge destination: Home 10. Overall Rehab/Functional Prognosis: good  RECOMMENDATIONS: This patient's condition is appropriate for continued rehabilitative care in the following setting: CIR Patient has agreed to participate in recommended program. Yes Note that insurance prior authorization may be required for reimbursement for recommended care.  Comment: Rehab Admissions Coordinator to follow up.  I have personally performed a face to face diagnostic evaluation, including, but not limited to relevant history and physical exam findings, of this patient and developed relevant assessment and plan.  Additionally, I have reviewed and concur with the physician assistant's documentation above.   Delice Lesch, MD, ABPMR Lavon Paganini Angiulli, PA-C 04/25/2020

## 2020-04-24 NOTE — Evaluation (Addendum)
Physical Therapy Evaluation Patient Details Name: Gregory Nunez. MRN: [id number] DOB: [birth year] Today's Date: 04/24/2020   History of Present Illness  Pt is a 75 year old male with a medical hx significant for dyspnea, alcohol use, prostate cancer, pancreatitis, HTN, HAV, DM2, and depression who presented with R-sided weakness, R-sided facial droop, and AMS when found wandering around a local [facility]. 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 They also showed superimposed remote lacunar infarcts involving the L paramedian pons and posterior R frontoparietal corona radiata. NIHSS of 3 and tPA not administered  Clinical Impression  Pt presents with condition mentioned above and deficits mentioned below, see PT Problem List. PTA patient was living alone in an 8th floor apartment with elevator access and was independent without AD. Patient was still driving and reports eating out for most meals. He reports having 24/7 assistance available from family and friends if needed. Patient currently presents below baseline level of function requiring min guard assist to come sit EOB with use of bed controls and rails, modA to come to stand from EOB, and modA to take several steps to the L with a RW to transfer to a recliner. He demonstrates decreased generalized lower extremity coordination and balance through knee instability without buckling and posterior LOB with modA to recover several times during his standing mobility. Pt also displays decreased strength in his R leg compared to his L, impacting his safety with mobility. Will continue to follow acutely. Recommending d/c to CIR to maximize pt safety and independence with all functional mobility.   See General Comments below for changes in BP throughout session. DO and RN notified.    Follow Up Recommendations  CIR;Supervision/Assistance - 24 hour    Equipment Recommendations  Rolling walker with 5" wheels;3in1 (PT)    Recommendations for Other Services Rehab consult     Precautions / Restrictions Precautions Precautions: Fall Precaution Comments: monitor BP (HTN and orthostatic hypotension) Restrictions Weight Bearing Restrictions: No      Mobility  Bed Mobility Overal bed mobility: Needs Assistance Bed Mobility: Supine to Sit     Supine to sit: Min guard;HOB elevated Sit to supine: Min guard   General bed mobility comments: Ues of bed rails and HOB elevated to come to sit EOB with extra time and effort, min guard for safety.    Transfers Overall transfer level: Needs assistance Equipment used: Rolling walker (2 wheeled) Transfers: Sit to/from [facility] Sit to Stand: Mod assist Stand pivot transfers: Mod assist      Lateral/Scoot Transfers: Min guard General transfer comment: Sit to stand from EOB with cues for hand placement with modA and extra time and efoort to power up. ModA for stability and to cue pt to sequence steps and manage RW to step to the L to transfer to recliner.  Ambulation/Gait Ambulation/Gait assistance: Mod assist Gait Distance (Feet): 3 Feet Assistive device: Rolling walker (2 wheeled) Gait Pattern/deviations: Step-through pattern;Decreased step length - right;Decreased step length - left;Decreased stride length;Decreased weight shift to right;Decreased weight shift to left;Shuffle;Leaning posteriorly Gait velocity: reduced Gait velocity interpretation: <1.31 ft/sec, indicative of household ambulator General Gait Details: Ambulates at slow pace with trunk sway and knee instability without buckling noted. Decreased bilat step length with evident shuffling, despite cues to correct. 3x posterior LOB when taking steps and once standing still at end of transfer, modA to recover.  Stairs            Wheelchair Mobility    Modified  Rankin (Stroke Patients Only) Modified Rankin (Stroke Patients Only) Pre-Morbid Rankin Score: No symptoms Modified Rankin: Moderately severe disability     Balance Overall balance assessment: Needs assistance Sitting-balance support: Bilateral upper extremity supported;Feet supported Sitting balance-Leahy Scale: Poor Sitting balance - Comments: UE support to maintain balance when moving legs, min guard for safety. Postural control: Posterior lean Standing balance support: Bilateral upper extremity supported;During functional activity Standing balance-Leahy Scale: Poor Standing balance comment: Reliant on UE support and external support for balance, with posterior LOB bouts and assistance to recover.                             Pertinent Vitals/Pain Pain Assessment: No/denies pain Pain Score: 0-No pain    Home Living Family/patient expects to be discharged to:: Private residence Living Arrangements: Alone Available Help at Discharge: Family;Friend(s);Available 24 hours/day Type of Home: Apartment Home Access: Level entry;Elevator     Home Layout: One level Home Equipment: Grab bars - tub/shower;Grab bars - toilet Additional Comments: High rise 8th floor apartment with elevator access    Prior Function Level of Independence: Independent         Comments: Independent with ADLs/IADLs without AD. Patient was still driving. Eats out most of the time but doesn't do much cooking.     Hand Dominance   Dominant Hand: Right    Extremity/Trunk Assessment   Upper Extremity Assessment Upper Extremity Assessment: Defer to OT evaluation RUE Deficits / Details: PROM WFL. Limited shoulder flexion ~80-90 degrees. Elbow flexion/extension WFL although slow. Patient able to flex/extend wrist/digits with increased time. MMT grossly 3+/5 RUE Sensation: WNL RUE Coordination: decreased fine motor;decreased gross motor    Lower Extremity Assessment Lower Extremity Assessment:  RLE deficits/detail;LLE deficits/detail RLE Deficits / Details: mmt SCORES GROSSLY 4- TO 4 RLE Sensation: WNL (denied numbness/tingling) RLE Coordination: decreased gross motor (no dysdiadochokinesia or dysmetria noted) LLE Deficits / Details: MMT scores of 4+ to 5 grossly LLE Sensation: WNL (denied numbness/tingling) LLE Coordination: decreased gross motor (no dysdiadochokinesia or dysmetria noted)    Cervical / Trunk Assessment Cervical / Trunk Assessment: Normal  Communication   Communication: Expressive difficulties (Word finding difficulty)  Cognition Arousal/Alertness: Awake/alert Behavior During Therapy: Flat affect Overall Cognitive Status: Impaired/Different from baseline Area of Impairment: Attention;Following commands;Safety/judgement;Memory;Problem solving;Awareness                   Current Attention Level: Sustained Memory: Decreased short-term memory Following Commands: Follows one step commands consistently;Follows one step commands with increased time;Follows multi-step commands inconsistently Safety/Judgement: Decreased awareness of safety;Decreased awareness of deficits Awareness: Emergent Problem Solving: Slow processing;Difficulty sequencing;Requires verbal cues General Comments: Patient A&Ox4 with increased time. Pt perseverating on words on occasion. Possible STM deficits as pt required repeated cues and extra time to proccess the cues to respond correctly. Poor safety awareness and reactional time to respond to LOB.      General Comments General comments (skin integrity, edema, etc.): BP start of session supine HOB elevated 148/83; BP sitting EOB 163/84; BP standing after transfer to chair 123/66; BP end of session sitting in recliner 147/94; Denied nausea, headache, lightheadedness or dizziness throughout    Exercises     Assessment/Plan    PT Assessment Patient needs continued PT services  PT Problem List Decreased strength;Decreased activity  tolerance;Decreased balance;Decreased mobility;Decreased coordination;Decreased cognition;Decreased knowledge of use  of DME;Decreased safety awareness;Decreased knowledge of precautions       PT Treatment Interventions DME instruction;Gait training;Stair training;Functional mobility training;Therapeutic activities;Therapeutic exercise;Balance training;Neuromuscular re-education;Cognitive remediation;Patient/family education    PT Goals (Current goals can be found in the Care Plan section)  Acute Rehab PT Goals Patient Stated Goal: To get better PT Goal Formulation: With patient Time For Goal Achievement: 05/08/20 Potential to Achieve Goals: Good    Frequency Min 4X/week   Barriers to discharge        Co-evaluation               AM-PAC PT "6 Clicks" Mobility  Outcome Measure Help needed turning from your back to your side while in a flat bed without using bedrails?: A Little Help needed moving from lying on your back to sitting on the side of a flat bed without using bedrails?: A Little Help needed moving to and from a bed to a chair (including a wheelchair)?: A Lot Help needed standing up from a chair using your arms (e.g., wheelchair or bedside chair)?: A Lot Help needed to walk in hospital room?: A Lot Help needed climbing 3-5 steps with a railing? : Total 6 Click Score: 13    End of Session Equipment Utilized During Treatment: Gait belt Activity Tolerance: Patient tolerated treatment well Patient left: in chair;with call bell/phone within reach;with chair alarm set Nurse Communication: Mobility status;Precautions;Other (comment) (lack of wall plug to chair alarm but chair alarm on battery; BP changes and pt asymptomatic) PT Visit Diagnosis: Unsteadiness on feet (R26.81);Other abnormalities of gait and mobility (R26.89);Muscle weakness (generalized) (M62.81);Difficulty in walking, not elsewhere classified (R26.2);Other symptoms and signs involving the nervous system  (R29.898)    Time: 1135-1200 PT Time Calculation (min) (ACUTE ONLY): 25 min   Charges:   PT Evaluation $PT Eval Moderate Complexity: 1 Mod PT Treatments $Therapeutic Activity: 8-22 mins        Moishe Spice, PT, DPT Acute Rehabilitation Services  Pager: [phone] Office: [phone]   Orvan Falconer 04/24/2020, 12:53 PM

## 2020-04-24 NOTE — Plan of Care (Signed)
  Problem: Education: Goal: Knowledge of patient specific risk factors addressed and post discharge goals established will improve Outcome: Progressing   Problem: Education: Goal: Knowledge of secondary prevention will improve Outcome: Progressing   Problem: Education: Goal: Knowledge of disease or condition will improve Outcome: Progressing   

## 2020-04-24 NOTE — Evaluation (Signed)
Speech Language Pathology Evaluation Patient Details Name: Gregory Nunez. MRN: [id number] DOB: [birth year] Today's Date: 04/24/2020 Time: 7829-5621 SLP Time Calculation (min) (ACUTE ONLY): 27 min  Problem List:  Patient Active Problem List   Diagnosis Date Noted  . Acute CVA (cerebrovascular accident) (HCC) 04/23/2020  . Hyperlipidemia associated with type 2 diabetes mellitus (HCC) 04/23/2020  . Alcohol use 04/23/2020  . AKI (acute kidney injury) (HCC) 04/23/2020  . Tobacco use 04/23/2020  . Unresponsive 11/08/2018  . Syncope, convulsive 11/08/2018  . Hypertension associated with diabetes (HCC) 11/08/2018  . Alcohol withdrawal seizure with complication, with unspecified complication (HCC) 11/08/2018  . Type 2 diabetes mellitus without complication (HCC) 11/08/2018  . Symptomatic anemia 04/15/2018  . Prostate cancer (HCC) 02/27/2014  . Malignant neoplasm of prostate (HCC) 01/09/2014  . Bunion 01/17/2013  . 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 Cancer [facility])    prostate  . Cataract    OD  . Depression   . Diabetes mellitus without complication (HCC)   . Diabetic retinopathy (HCC)    NPDR OU  . GERD (gastroesophageal reflux disease)    if drinks alcohol  . 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 Hyperlipidemia   . Hypertension   . Hypertensive retinopathy    OU  . Malignant neoplasm of prostate (HCC) 01/09/2014  . Pancreatitis   . Prostate cancer (HCC) 12/19/13   Gleason 4+3=7, volume 31.31 cc  . Shortness of breath dyspnea    with exertion    Past Surgical History:  Past Surgical History:  Procedure Laterality Date  . BIOPSY  04/16/2018   Procedure: BIOPSY;  Surgeon: Charna Elizabeth, MD;  Location: [facility] ENDOSCOPY;  Service: Endoscopy;;  . biopsy on throat     hx of   . CATARACT EXTRACTION Bilateral    Dr. Baker Pierini  .  ESOPHAGOGASTRODUODENOSCOPY Left 04/16/2018   Procedure: ESOPHAGOGASTRODUODENOSCOPY (EGD);  Surgeon: Charna Elizabeth, MD;  Location: [facility];  Service: Endoscopy;  Laterality: Left;  . EYE SURGERY    . FOOT SURGERY    . HOT HEMOSTASIS N/A 04/16/2018   Procedure: HOT HEMOSTASIS (ARGON PLASMA COAGULATION/BICAP);  Surgeon: Charna Elizabeth, MD;  Location: [facility];  Service: Endoscopy;  Laterality: N/A;  . LYMPHADENECTOMY Bilateral 02/27/2014   Procedure: BILATERAL LYMPHADENECTOMY;  Surgeon: Sebastian Ache, MD;  Location: [facility] ORS;  Service: Urology;  Laterality: Bilateral;  . PROSTATE BIOPSY  12/2013   Gleason 4+3=7, volume 31.31 cc  . ROBOT ASSISTED LAPAROSCOPIC RADICAL PROSTATECTOMY N/A 02/27/2014   Procedure: ROBOTIC ASSISTED LAPAROSCOPIC RADICAL PROSTATECTOMY WITH INDOCYANINE GREEN DYE;  Surgeon: Sebastian Ache, MD;  Location: [facility] ORS;  Service: Urology;  Laterality: N/A;   HPI:  75 y.o. male with medical history significant for type 2 diabetes with diabetic retinopathy, hypertension, hyperlipidemia, prostate cancer, and alcohol use who presents to the ED for evaluation of right-sided weakness 04/23/20 MRI head indicated 3 cm acute ischemic nonhemorrhagic infarct involving the left basal ganglia, corresponding with abnormality on prior CT.2. Additional 5 mm subacute ischemic nonhemorrhagic right periatrial white matter infarct  Assessment / Plan / Recommendation Clinical Impression  The [employer] Mental Status Examination (SLUMS) was administered with a score obtained of 11/30 (WIth a normal score of 27/30 being typical for this assessment)  indicating deficits in the areas of attention, memory, auditory comprehension and organization.  When expressing himself, he oftentimes  perseverated on words such as "apple" and had difficulty naming items within categories which is likely due to memory components/attention during sustained attention linguistic tasks.  OME revealed right oral  weakness/decreased coordination with tasks such as tongue protrusion, labial lateralization and noted decreased sensory awareness with breakfast consumption.  Speech was mildly dysarthric within simple conversation with speech being judged as 75% intelligible due to imprecise articulation and decreased vocal intensity.  Pt would benefit from ST while in acute setting.  Thank you for this consult.    SLP Assessment  SLP Recommendation/Assessment: Patient needs continued Speech Lanaguage Pathology Services SLP Visit Diagnosis: Attention and concentration deficit;Cognitive communication deficit (R41.841);Dysarthria and anarthria (R47.1) Attention and concentration deficit following: Cerebral infarction    Follow Up Recommendations  24 hour supervision/assistance    Frequency and Duration min 2x/week  1 week      SLP Evaluation Cognition  Overall Cognitive Status: Impaired/Different from baseline Orientation Level: Oriented to person;Oriented to place;Oriented to situation Attention: Sustained Sustained Attention: Impaired Sustained Attention Impairment: Verbal basic;Functional basic Memory: Impaired Memory Impairment: Decreased recall of new information;Retrieval deficit;Decreased short term memory Decreased Short Term Memory: Verbal basic;Functional basic Problem Solving: Impaired Problem Solving Impairment: Verbal basic;Functional basic Behaviors: Perseveration       Comprehension  Auditory Comprehension Overall Auditory Comprehension: Impaired Commands: Impaired Multistep Basic Commands: 25-49% accurate Conversation: Simple Interfering Components: Processing speed;Working Curator: Not tested Reading Comprehension Reading Status: Not tested    Expression Expression Primary Mode of Expression: Verbal Verbal Expression Overall Verbal Expression: Impaired Level of Generative/Spontaneous Verbalization: Conversation Repetition:  Impaired Level of Impairment: Phrase level Naming: Impairment Responsive: 51-75% accurate Confrontation: Impaired Convergent: 25-49% accurate Divergent: 25-49% accurate Verbal Errors: Perseveration Non-Verbal Means of Communication: Not applicable Written Expression Dominant Hand: Right Written Expression: Not tested   Oral / Motor  Oral Motor/Sensory Function Overall Oral Motor/Sensory Function: Mild impairment Facial ROM: Reduced right Facial Symmetry: Abnormal symmetry right Facial Strength: Reduced right Facial Sensation: Reduced right Lingual ROM: Reduced right Lingual Symmetry: Abnormal symmetry right Lingual Strength: Reduced Motor Speech Overall Motor Speech: Appears within functional limits for tasks assessed Respiration: Within functional limits Phonation: Low vocal intensity Resonance: Within functional limits Articulation: Impaired Level of Impairment: Sentence Intelligibility: Intelligibility reduced Word: 50-74% accurate Phrase: 50-74% accurate Sentence: 50-74% accurate Conversation: 50-74% accurate Motor Planning: Witnin functional limits Motor Speech Errors: Not applicable                      Elvina Sidle, M.S., CCC-SLP 04/24/2020, 3:48 PM

## 2020-04-24 NOTE — Plan of Care (Signed)
  Problem: Education: Goal: Knowledge of General Education information will improve Description: Including pain rating scale, medication(s)/side effects and non-pharmacologic comfort measures Outcome: Progressing   Problem: Education: Goal: Knowledge of secondary prevention will improve 04/24/2020 0840 by Azzie Roup I, RN Outcome: Progressing 04/24/2020 0839 by Ashley Royalty, RN Outcome: Progressing

## 2020-04-24 NOTE — Progress Notes (Signed)
We will need Speech therapy and  Physical therapy to determine best place for rehab- Occupational therapy has seen him, but no other therapies has a note in computer. - it appears from chart, he lives on his own, and it was bystanders that had him brought to the hospital, not family.   This is concerning for trying to find family to take care of him 24/7- which is required for inpt rehab at [facility].   Will have consult done tomorrow, not today, since need more information to make a decision.

## 2020-04-24 NOTE — Progress Notes (Signed)
  Echocardiogram 2D Echocardiogram has been performed.  Charvis Lightner G Kelseigh Diver 04/24/2020, 11:48 AM

## 2020-04-24 NOTE — Progress Notes (Addendum)
STROKE TEAM PROGRESS NOTE   SUBJECTIVE (INTERVAL HISTORY) Patient is sitting in the chair eating lunch during exam.  He tells symptoms started probably about 2 days ago.  No voiced complaints at this time .  Vital signs are stable.  MRI scan shows a large 3 cm left basal ganglia and corona radiator infarct.  MRA   shows no significant large vessel stenosis in the brain and the neck.Marland Kitchen  LDL cholesterol is 82 mg percent.  Hemoglobin A1c 6.5.   OBJECTIVE Vitals:   04/24/20 0428 04/24/20 0514 04/24/20 0857 04/24/20 1235  BP: 125/81 (!) 178/98 (!) 166/95 (!) 153/82  Pulse: 75 81 80 90  Resp: 18 16 18 17   Temp: (!) 97.4 F (36.3 C) (!) 97.5 F (36.4 C) 97.9 F (36.6 C) 97.7 F (36.5 C)  TempSrc: Axillary Oral Oral Oral  SpO2: 99% 100% 100% 100%  Weight:      Height:        Lipid Panel:  Recent Labs  Lab 04/24/20 0435  CHOL 141  TRIG 105  HDL 38*  CHOLHDL 3.7  VLDL 21  LDLCALC 82   HgbA1c:  Lab Results  Component Value Date   HGBA1C 6.5 (H) 04/24/2020   Urine Drug Screen:     Component Value Date/Time   LABOPIA NONE DETECTED 11/08/2018 0015   COCAINSCRNUR NONE DETECTED 11/08/2018 0015   LABBENZ NONE DETECTED 11/08/2018 0015   AMPHETMU NONE DETECTED 11/08/2018 0015   THCU NONE DETECTED 11/08/2018 0015   LABBARB NONE DETECTED 11/08/2018 0015    Alcohol Level     Component Value Date/Time   ETH <10 11/07/2018 2154    IMAGING  CT head 1. New ill-defined hypodensity left lenticular nucleus suspicious for subacute infarct. 2. New hypodensity in the left paramedian pons consistent with acute infarction which is likely chronic but could be recent. 3. Negative for hemorrhage  MRI Brain  1. 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 2. Additional 5 mm subacute ischemic nonhemorrhagic right periatrial white matter infarct. 3. Underlying age-related cerebral atrophy with moderate  chronic microvascular ischemic disease, with superimposed remote lacunar infarcts involving the left paramedian pons and posterior right frontoparietal corona radiata.  MRA head   1. Motion degraded exam. 2. Negative intracranial MRA for large vessel occlusion. 3. 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 No other hemodynamically significant or correctable stenosis identified.  MRA Neck  1. Wide patency of both carotid artery systems within the neck. 2. Single short-segment moderate stenosis involving the pre foraminal left V1 segment. Vertebral arteries otherwise widely patent within the neck. Left vertebral artery dominant.  PHYSICAL EXAM  Physical Exam  Constitutional: Appears well-developed and well-nourished elderly African-American male.  Psych: Affect appropriate to situation Eyes: No scleral injection HENT: No OP obstrucion MSK: no joint deformities.  Cardiovascular: Normal rate and regular rhythm.  Respiratory: Effort normal, non-labored breathing GI: Soft.  No distension. There is no tenderness.  Skin: WDI  Neuro: Mental Status: Patient is awake, alert, oriented to person, place, month, year, and situation. Patient is able to give a clear and coherent history. No signs of aphasia or neglect Cranial Nerves: II: Visual Fields are full. Pupils are equal, round, and reactive to light.   III,IV, VI: EOMI without ptosis or diploplia.  V: Facial sensation is symmetric to temperature VII: Facial movement is asymmetric.  VIII: hearing is intact to voice X: Uvula elevates symmetrically XI: Shoulder shrug is  symmetric. XII: tongue is deviated to right without atrophy or fasciculations.  Motor: Tone is normal. Bulk is normal. 5/5 strength on left arm, left leg and right leg. Right arm is 3/5 significant weakness of right grip and intrinsic hand muscles.  Obvious left or right upper extremity.  Trace weakness of right ankle dorsiflexors and hip  flexors. Sensory: Decreased on right  Cerebellar: FNF ataxia on right    NIHSS 1a Level of Conscious.: 0 1b LOC Questions: 0 1c LOC Commands: 0 2 Best Gaze: 0 3 Visual: 0 4 Facial Palsy: 2 5a Motor Arm - left: 0 5b Motor Arm - Right: 0 6a Motor Leg - Left: 0 6b Motor Leg - Right: 0 7 Limb Ataxia: 1 8 Sensory: 1 9 Best Language: 0 10 Dysarthria: 1 11 Extinct. and Inatten.: 0 TOTAL: 5  ASSESSMENT/PLAN [name] is a 75 y.o. male with history of PMHx of prostate cancer, HTN, HLD, substance abuse, ? COPD, and DM II with retinopathy. Pt admits to 2 liquor drinks a day and no ETOH x 3 days.  Patient is brought in to the ED via EMS for being found walking around the parking lot confused. EMS reported right facial droop and confusion to orientation questions. 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   LDL 82  HgbA1c 6.5  SCD's and lovenox 40 mg for VTE prophylaxis Diet Order            Diet heart healthy/carb modified Room service appropriate? Yes; Fluid consistency: Thin  Diet effective now                  No antithrombotic prior to admission, now on aspirin 81 mg daily and clopidogrel 75 mg daily. Continue both for 3 weeks then ASA only   Patient counseled to be compliant with his antithrombotic medications  Ongoing aggressive stroke risk factor management  Therapy recommendations:  Pending  Disposition:  Pending   Hypertension  Stable .  Permissive hypertension (OK if < 220/120) but gradually normalize in 5-7 days . BP 125/79-207/162 . Home meds on hold  .  Long-term BP goal normotensive  Hyperlipidemia  Home meds: not on meds    LDL 82, goal < 70  Add lipitor 40 mg   Continue statin at discharge  Diabetes type II  HgbA1c 6.5, goal < 7.0  Other Stroke Risk  Factors  Advanced age  Cigarette smoker  -advised to stop smoking  ETOH use, advised to drink no more than 1 drink(s) a day  Coronary artery disease   Other Active Problems  Hospital day # 0 He presented with right hemiparesis secondary to large left subcortical infarct likely acute in etiology.  Recommend continue telemetry monitoring for arrhythmia and loop recorder for paroxysmal A. fib.  Aspirin and Plavix for 3 weeks followed by aspirin alone and aggressive risk factor modification.  Patient advised to quit smoking.  Therapy consults.  Greater than 50% time during this 35-minute visit were spent on counseling and coordination of care about his cryptogenic stroke and answering questions.  Discussed with Dr. [place]  Shereece Wellborn, MD To contact Stroke Continuity provider, please refer to [email or web address] After hours, contact General Neurology

## 2020-04-24 NOTE — Progress Notes (Signed)
CrCl 67ml/min. Adjusting lovenox to 40mg  qday.  , PharmD, BCIDP, AAHIVP, CPP Infectious Disease Pharmacist 04/24/2020 2:41 PM

## 2020-04-24 NOTE — Evaluation (Signed)
Occupational Therapy Evaluation Patient Details Name: Gregory Nunez. MRN: [id number] DOB: 28-Jan-1946 Today's Date: 04/24/2020    History of Present Illness 75 y.o. male presenting with R-sided weakness and poor p.o. intake. Imaging (+) acute L paramedian pons and subacute L lenticular nucleus infarcts. Patient also found to have AKI. PMHx significant for DM II w/ retinopathy, HTN, HLD, prostate CA, and ETOH use disorder.   Clinical Impression   PTA patient was living alone in an 8th floor apartment with elevator access and was independent without AD. Patient was still driving and reports eating out for most meals. Patient currently presents below baseline level of function requiring Min A to Mod A grossly for ADLs per clinical judgement and limited functional assessment. Functional transfers and mobility deferred this date 2/2 elevated Bp on 175/98 in supine and 166/104 seated EOB. Patient also limited by R inattention, R hemiparesis, decreased coordination demonstrating undershooting during assessment, decreased orientation to midline, and decreased static/dynamic sitting balance. Patient would benefit from continued acute OT services in prep for safe d/c to next level of care with recommendation for CIR.     Follow Up Recommendations  CIR    Equipment Recommendations  3 in 1 bedside commode    Recommendations for Other Services Rehab consult     Precautions / Restrictions Precautions Precautions: Fall Restrictions Weight Bearing Restrictions: No      Mobility Bed Mobility Overal bed mobility: Needs Assistance Bed Mobility: Supine to Sit;Sit to Supine     Supine to sit: Min guard Sit to supine: Min guard   General bed mobility comments: Min guard for supine <> EOB +rail. Patient able to advance BLE toward EOB and elevate BLE from EOB to bed level upon return to supine. Cues for technique.    Transfers Overall transfer level: Needs assistance   Transfers:  Lateral/Scoot Transfers          Lateral/Scoot Transfers: Min guard General transfer comment: Min guard for lateral scoots toward HOB. Additional transfers deferred 2/2 elevated BP.    Balance Overall balance assessment: Needs assistance Sitting-balance support: Bilateral upper extremity supported;Feet supported Sitting balance-Leahy Scale: Fair Sitting balance - Comments: Cues for orientation to midline and tactile cues to maintain upright position. Postural control: Right lateral lean     Standing balance comment: Deferred 2/2 elevated BP.                           ADL either performed or assessed with clinical judgement   ADL Overall ADL's : Needs assistance/impaired Eating/Feeding: Set up;Sitting Eating/Feeding Details (indicate cue type and reason): Difficulty opening food containers/packets. Able to spear food on plate and bring to mouth with dominant R hand. Requires increased time. Grooming: Wash/dry hands;Wash/dry face;Sitting;Set up Grooming Details (indicate cue type and reason): Patient able to wash face/hands seated EOB. R lateral lean noted.         Upper Body Dressing : Minimal assistance;Sitting Upper Body Dressing Details (indicate cue type and reason): To don hospital gown. Lower Body Dressing: Moderate assistance;Sit to/from stand Lower Body Dressing Details (indicate cue type and reason): Mod A to doff/don footwear seated EOB. Patient attemptes to use affected RUE but difficulty noted 2/2 decreased coordination/strength.               General ADL Comments: Deferred functional mobility 2/2 elevated BP. Please refer to values below.     Vision Baseline Vision/History: Retinopathy Patient Visual Report: No change from baseline  Vision Assessment?: Vision impaired- to be further tested in functional context Additional Comments: Able to track therapists index finger during H-test. Patient also able to track beyond midline on R with additional  head turns. Peripheral vision appears to be intact.     Perception     Praxis      Pertinent Vitals/Pain Pain Assessment: 0-10 Pain Score: 0-No pain     Hand Dominance Right   Extremity/Trunk Assessment Upper Extremity Assessment Upper Extremity Assessment: RUE deficits/detail RUE Deficits / Details: PROM WFL. Limited shoulder flexion ~80-90 degrees. Elbow flexion/extension WFL although slow. Patient able to flex/extend wrist/digits with increased time. MMT grossly 3+/5 RUE Sensation: WNL RUE Coordination: decreased fine motor;decreased gross motor   Lower Extremity Assessment Lower Extremity Assessment: Defer to PT evaluation       Communication Communication Communication: Expressive difficulties (Word finding difficulty)   Cognition Arousal/Alertness: Awake/alert Behavior During Therapy: Flat affect Overall Cognitive Status: Impaired/Different from baseline Area of Impairment: Attention;Following commands;Safety/judgement                   Current Attention Level: Sustained   Following Commands: Follows one step commands consistently;Follows one step commands with increased time;Follows multi-step commands inconsistently Safety/Judgement: Decreased awareness of safety;Decreased awareness of deficits     General Comments: Patient A&Ox4 with increased time.   General Comments  BP 175/98 in supine and 166/104 seated EOB. HR in 80's and SpO2 100% on RA.    Exercises     Shoulder Instructions      Home Living Family/patient expects to be discharged to:: Other (Comment)                                 Additional Comments: High rise 8th floor apartment wtih elevator access      Prior Functioning/Environment Level of Independence: Independent        Comments: Independent with ADLs/IADLs without AD. Patient was still driving. Eats out most of the time but doesn't do much cooking.        OT Problem List: Decreased strength;Decreased  range of motion;Impaired balance (sitting and/or standing);Impaired vision/perception;Decreased coordination;Decreased safety awareness;Decreased knowledge of use of DME or AE;Impaired UE functional use      OT Treatment/Interventions: Self-care/ADL training;Therapeutic exercise;Neuromuscular education;Energy conservation;DME and/or AE instruction;Therapeutic activities;Visual/perceptual remediation/compensation;Patient/family education;Balance training    OT Goals(Current goals can be found in the care plan section) Acute Rehab OT Goals Patient Stated Goal: To get better OT Goal Formulation: With patient Time For Goal Achievement: 05/08/20 Potential to Achieve Goals: Good ADL Goals Pt Will Perform Grooming: with modified independence;standing Pt Will Perform Upper Body Dressing: with modified independence;sitting Pt Will Perform Lower Body Dressing: with modified independence;sit to/from stand Pt Will Transfer to Toilet: with supervision;bedside commode;ambulating Pt Will Perform Toileting - Clothing Manipulation and hygiene: with supervision;sit to/from stand Pt Will Perform Tub/Shower Transfer: Shower transfer;3 in 1;grab bars;rolling walker Additional ADL Goal #1: Patient will complete RUE NMR HEP with use of written handout in prep for ADLs.  OT Frequency: Min 2X/week   Barriers to D/C:            Co-evaluation              AM-PAC OT "6 Clicks" Daily Activity     Outcome Measure Help from another person eating meals?: A Little Help from another person taking care of personal grooming?: A Little Help from another person toileting, which includes using  toliet, bedpan, or urinal?: A Little Help from another person bathing (including washing, rinsing, drying)?: A Lot Help from another person to put on and taking off regular upper body clothing?: A Little Help from another person to put on and taking off regular lower body clothing?: A Lot 6 Click Score: 16   End of Session  Equipment Utilized During Treatment: Gait belt Nurse Communication: Mobility status;Other (comment) (Need for set-up of meal tray)  Activity Tolerance: Treatment limited secondary to medical complications (Comment) (Elevated BP) Patient left: in bed;with call bell/phone within reach;with bed alarm set;Other (comment) (Bed in chair position)  OT Visit Diagnosis: Muscle weakness (generalized) (M62.81);Ataxia, unspecified (R27.0);Other symptoms and signs involving the nervous system (R29.898);Hemiplegia and hemiparesis Hemiplegia - Right/Left: Right Hemiplegia - dominant/non-dominant: Dominant Hemiplegia - caused by: Cerebral infarction                Time: 7048-8891 OT Time Calculation (min): 34 min Charges:  OT General Charges $OT Visit: 1 Visit OT Evaluation $OT Eval Moderate Complexity: 1 Mod OT Treatments $Self Care/Home Management : 8-22 mins  Mulki Roesler H. OTR/L Supplemental OT, Department of rehab services [phone]  Jakeel Starliper R H. 04/24/2020, 10:42 AM

## 2020-04-24 NOTE — Progress Notes (Signed)
Pt &75 yrs old AA admitted to unit from Gregory Nunez with Dx  AMS  With code stroke as pt was seen in [facility] wandering around and confused. Pt  On arrival to unit alert awake and oriented to place person time and year . Stated he did not know why he was brought to Ed but told he has a stroke.  .  Noted slight weakness to rt  To rt arm with a drift rt leg drift and rt facial droop and ataxia to both rt arm and leg.  Pt oriented to unit and use of call light for help  Safety education given and cardiac monitor in use.  Rate 85 NSR This RN will continue to monitor.smoking cessation and reducing Alcohol intake education given. Pt demonstrated good understanding.

## 2020-04-24 NOTE — Progress Notes (Addendum)
PROGRESS NOTE    Gregory Nunez Gregory Nunez.  OZD:[id number] DOB: Dec 30, 1945 DOA: 04/23/2020 PCP: [name], MD    Brief Narrative:  Gregory Nunez. is a 75 year old male with past medical history significant for type 2 diabetes mellitus with diabetic retinopathy, essential hypertension, hyperlipidemia, prostate cancer and alcohol use disorder who presented to the ED with right-sided weakness.  Patient was found wandering around a local [facility] and EMS was activated with notable right-sided facial droop and altered mental status.  Upon chart review, concern for excessive alcohol abuse from prior presentations.  Patient reports his last drink was roughly 12 days ago.  Does report chronic tobacco use of 1.5 packs/day.  In the ED, BP 190/94, HR 95, RR 15, SPO2 97% room air.  WBC 5.2, hemoglobin 11.6, platelets 196.  Sodium 137, potassium 3.5, CO2 19, BUN 38, creatinine 2.35, glucose 110.  AST 68, ALT 39, alk phos 45, total bilirubin 0.8.  SARS-CoV-2/influenza A/B PCR negative.  CT head without contrast with new hypodensity in left paramedian pons consistent with acute infarction and ill-defined hypodensity left lenticular nuclear suspicious for subacute infarct.  Neurology was consulted.  Patient received 325 mg aspirin and 1 L NS bolus in the ED.  Also received Librium and Ativan as needed per CIWA protocol for concern of EtOH withdrawal.  Hospitalist service consulted for further evaluation and management of acute CVA.   Assessment & Plan:   Principal Problem:   Acute CVA (cerebrovascular accident) ([place]) Active Problems:   Hypertension associated with diabetes ([place])   Type 2 diabetes mellitus without complication ([place])   Hyperlipidemia associated with type 2 diabetes mellitus ([place])   Alcohol use   AKI (acute kidney injury) ([place])   Tobacco use   Acute CVA Patient presenting to the ED via EMS after being found wandering, confused with right-sided weakness and facial droop at [facility].  CT  head without contrast notable for ischemic infarct left basal ganglia without hemorrhage/mass.  MR brain with 3 mm acute ischemic nonhemorrhagic infarct left basal ganglia and 5 mm subacute infarct right periatrial white matter.  MRA head/neck with no large vessel occlusion but with short segment moderate stenosis mid basilar artery, short segment moderate stenosis 3 frontal left V1 segment, patent carotid arteries.  Hemoglobin A1c 6.5.  Lipid panel with total cholesterol 141, HDL 38, LDL 82, and triglycerides 105.  TTE with LVEF 60-65%, G1DD, mild MR, no LV regional wall motion abnormalities, RV systolic function normal, IVC normal in size.  --Neurology following, appreciate assistance --PT recommends CIR, rolling walker with 5 inch wheels, 3 and 1 bedside commode --OT recommends CIR --SLP evaluation: Pending --Aspirin 81 mg p.o. daily, Plavix 75 mg p.o. daily --Started atorvastatin 40 mg p.o. daily  Acute Renal Failure Etiology likely prerenal from poor oral intake.  Received IV fluid bolus on admission. --Cr 2.35>1.20 (baseline 1.05-1.1) --Avoid nephrotoxins, renally dose all medications --Repeat BMP in a.m.  Essential hypertension On amlodipine 10 mg at home.   --Holding home antihypertensives to allow for permissive hypertension  Type 2 diabetes mellitus Hemoglobin A1c 6.5.  On Metformin 500 mg twice daily at home. --Sensitive insulin sliding scale for coverage --CBGs before every meal/at bedtime  Hyperlipidemia Lipid panel with total cholesterol 141, HDL 38, LDL 82, and triglycerides 105. --Atorvastatin 40 mg p.o. daily  Tobacco use disorder Reports smoking 1.5 packs/day.  Counseled on need for complete cessation --Nicotine patch  EtOH use disorder --CIWAA protocol with symptom triggered Ativan --Thiamine, folic acid, multivitamin  DVT prophylaxis: Lovenox Code Status: Full code Family Communication: Updated patient extensively at bedside  Disposition Plan:  Status is:  Inpatient  Remains inpatient appropriate because:Ongoing diagnostic testing needed not appropriate for outpatient work up, Unsafe d/c plan, IV treatments appropriate due to intensity of illness or inability to take PO and Inpatient level of care appropriate due to severity of illness   Dispo: The patient is from: Home              Anticipated d/c is to: To be determined pending therapy evaluation              Anticipated d/c date is: 2 days              Patient currently is not medically stable to d/c.   Consultants:   Neurology  Procedures:   TTE  Antimicrobials:   None   Subjective: Patient seen and examined bedside, resting comfortably.  No specific complaints this morning.  Denies headache, no fever/chills/night sweats, no nausea/vomiting/diarrhea, no chest pain, no palpitations, no shortness of breath, no abdominal pain.  No acute events overnight per nursing staff.  Objective: Vitals:   04/24/20 0428 04/24/20 0514 04/24/20 0857 04/24/20 1235  BP: 125/81 (!) 178/98 (!) 166/95 (!) 153/82  Pulse: 75 81 80 90  Resp: _0 Temp: (!) 97.4 F (36.3 C) (!) 97.5 F (36.4 C) 97.9 F (36.6 C) 97.7 F (36.5 C)  TempSrc: Axillary Oral Oral Oral  SpO2: 99% 100% 100% 100%  Weight:      Height:        Intake/Output Summary (Last 24 hours) at 04/24/2020 1450 Last data filed at 04/24/2020 0921 Gross per 24 hour  Intake 1051.11 ml  Output 650 ml  Net 401.11 ml   Filed Weights   04/23/20 1743  Weight: 84.4 kg    Examination:  General exam: Appears calm and comfortable  Respiratory system: Clear to auscultation. Respiratory effort normal.  Oxygenating well on room air Cardiovascular system: S1 & S2 heard, RRR. No JVD, murmurs, rubs, gallops or clicks. No pedal edema. Gastrointestinal system: Abdomen is nondistended, soft and nontender. No organomegaly or masses felt. Normal bowel sounds heard. Central nervous system: Alert and oriented.  Right-sided facial droop,  otherwise no focal neurological deficits. Extremities: Symmetric 5 x 5 power. Skin: No rashes, lesions or ulcers Psychiatry: Judgement and insight appear poor. Mood & affect appropriate.     Data Reviewed: I have personally reviewed following labs and imaging studies  CBC: Recent Labs  Lab 04/23/20 1715 04/23/20 1723  WBC 5.2  --   NEUTROABS 3.5  --   HGB 11.6* 11.9*  HCT 35.7* 35.0*  MCV 95.2  --   PLT 196  --    Basic Metabolic Panel: Recent Labs  Lab 04/23/20 1715 04/23/20 1723  NA 137 141  K 3.5 3.6  CL 104 105  CO2 19*  --   GLUCOSE 110* 108*  BUN 38* 38*  CREATININE 2.35* 2.20*  CALCIUM 9.3  --    GFR: Estimated Creatinine Clearance: 32.3 mL/min (A) (by C-G formula based on SCr of 2.2 mg/dL (H)). Liver Function Tests: Recent Labs  Lab 04/23/20 1715  AST 68*  ALT 39  ALKPHOS 45  BILITOT 0.8  PROT 8.0  ALBUMIN 3.4*   No results for input(s): LIPASE, AMYLASE in the last 168 hours. No results for input(s): AMMONIA in the last 168 hours. Coagulation Profile: Recent Labs  Lab 04/23/20 1715  INR  1.0   Cardiac Enzymes: No results for input(s): CKTOTAL, CKMB, CKMBINDEX, TROPONINI in the last 168 hours. BNP (last 3 results) No results for input(s): PROBNP in the last 8760 hours. HbA1C: Recent Labs    04/24/20 0435  HGBA1C 6.5*   CBG: Recent Labs  Lab 04/23/20 2211 04/24/20 0000 04/24/20 1233  GLUCAP 70 86 175*   Lipid Profile: Recent Labs    04/24/20 0435  CHOL 141  HDL 38*  LDLCALC 82  TRIG 105  CHOLHDL 3.7   Thyroid Function Tests: No results for input(s): TSH, T4TOTAL, FREET4, T3FREE, THYROIDAB in the last 72 hours. Anemia Panel: No results for input(s): VITAMINB12, FOLATE, FERRITIN, TIBC, IRON, RETICCTPCT in the last 72 hours. Sepsis Labs: No results for input(s): PROCALCITON, LATICACIDVEN in the last 168 hours.  Recent Results (from the past 240 hour(s))  Resp Panel by RT-PCR (Flu A&B, Covid) Nasopharyngeal Swab     Status:  None   Collection Time: 04/23/20  7:25 PM   Specimen: Nasopharyngeal Swab; Nasopharyngeal(NP) swabs in vial transport medium  Result Value Ref Range Status   SARS Coronavirus 2 by RT PCR NEGATIVE NEGATIVE Final    Comment: (NOTE) SARS-CoV-2 target nucleic acids are NOT DETECTED.  The SARS-CoV-2 RNA is generally detectable in upper respiratory specimens during the acute phase of infection. The lowest concentration of SARS-CoV-2 viral copies this assay can detect is 138 copies/mL. A negative result does not preclude SARS-Cov-2 infection and should not be used as the sole basis for treatment or other patient management decisions. A negative result may occur with  improper specimen collection/handling, submission of specimen other than nasopharyngeal swab, presence of viral mutation(s) within the areas targeted by this assay, and inadequate number of viral copies(<138 copies/mL). A negative result must be combined with clinical observations, patient history, and epidemiological information. The expected result is Negative.  Fact Sheet for Patients:  EntrepreneurPulse.com.au  Fact Sheet for Healthcare Providers:  IncredibleEmployment.be  This test is no t yet approved or cleared by the [place] FDA and  has been authorized for detection and/or diagnosis of SARS-CoV-2 by FDA under an Emergency Use Authorization (EUA). This EUA will remain  in effect (meaning this test can be used) for the duration of the COVID-19 declaration under Section 564(b)(1) of the Act, 21 U.S.C.section 360bbb-3(b)(1), unless the authorization is terminated  or revoked sooner.       Influenza A by PCR NEGATIVE NEGATIVE Final   Influenza B by PCR NEGATIVE NEGATIVE Final    Comment: (NOTE) The Xpert Xpress SARS-CoV-2/FLU/RSV plus assay is intended as an aid in the diagnosis of influenza from Nasopharyngeal swab specimens and should not be used as a sole basis for  treatment. Nasal washings and aspirates are unacceptable for Xpert Xpress SARS-CoV-2/FLU/RSV testing.  Fact Sheet for Patients: EntrepreneurPulse.com.au  Fact Sheet for Healthcare Providers: IncredibleEmployment.be  This test is not yet approved or cleared by the [place] FDA and has been authorized for detection and/or diagnosis of SARS-CoV-2 by FDA under an Emergency Use Authorization (EUA). This EUA will remain in effect (meaning this test can be used) for the duration of the COVID-19 declaration under Section 564(b)(1) of the Act, 21 U.S.C. section 360bbb-3(b)(1), unless the authorization is terminated or revoked.  Performed at [facility], [place] [address] [place], [place] [address]          Radiology Studies: CT HEAD WO CONTRAST  Result Date: 04/24/2020 CLINICAL DATA:  Follow-up examination for acute stroke. EXAM: CT HEAD  WITHOUT CONTRAST TECHNIQUE: Contiguous axial images were obtained from the base of the skull through the vertex without intravenous contrast. COMPARISON:  Prior MRI from 04/23/2020. FINDINGS: Brain: Previously identified approximate 3 cm acute ischemic infarct involving the left basal ganglia again seen, stable in size and distribution. No hemorrhagic transformation or other complication. No significant mass effect. Previously noted additional subcentimeter subacute infarct involving the right periatrial white matter not well visualized by CT. No other new acute intracranial process. No hemorrhage or new large vessel territory infarct. No mass lesion, midline shift or mass effect. No hydrocephalus or extra-axial fluid collection. Atrophy with chronic microvascular ischemic disease again noted. Remote pontine infarct again noted as well. Vascular: No visible hyperdense vessel. Calcified atherosclerosis at the skull base. Skull: Scalp soft tissues and calvarium within normal limits. Sinuses/Orbits: Globes and orbital  soft tissues demonstrate no acute finding. Paranasal sinuses remain clear. No significant mastoid effusion. Other: None. IMPRESSION: 1. No significant interval change in size and distribution of ischemic infarct involving the left basal ganglia. No hemorrhagic transformation or other complication. 2. Previously identified subcentimeter subacute right periatrial infarct not well visualized by CT. 3. No other new acute intracranial abnormality. Electronically Signed   By: Jeannine Boga M.D.   On: 04/24/2020 04:47   MR ANGIO HEAD WO CONTRAST  Result Date: 04/23/2020 CLINICAL DATA:  Initial evaluation for acute stroke. EXAM: MRI HEAD WITHOUT CONTRAST MRA HEAD WITHOUT CONTRAST MRA NECK WITHOUT AND WITH CONTRAST TECHNIQUE: Multiplanar, multiecho pulse sequences of the brain and surrounding structures were obtained without intravenous contrast. Angiographic images of the Circle of Willis were obtained using MRA technique without intravenous contrast. Angiographic images of the neck were obtained using MRA technique without and with intravenous contrast. Carotid stenosis measurements (when applicable) are obtained utilizing NASCET criteria, using the distal internal carotid diameter as the denominator. CONTRAST:  34m GADAVIST GADOBUTROL 1 MMOL/ML IV SOLN COMPARISON:  Prior head CT from earlier the same day. FINDINGS: MRI HEAD FINDINGS Brain: Examination degraded by motion artifact. Diffuse prominence of the CSF containing spaces compatible generalized age-related cerebral atrophy. Patchy and confluent T2/FLAIR hyperintensity within the periventricular and deep white matter both cerebral hemispheres most consistent with chronic small vessel ischemic disease. Patchy involvement of the pons noted. Superimposed remote lacunar infarct present at the left paramedian pons. Additional small remote lacunar infarct present at the posterior right frontoparietal corona radiata (series 15, image 17). Approximate 3 cm focus of  curvilinear restricted diffusion seen involving the left basal ganglia, extending from the left lentiform nucleus/subinsular white matter to the left caudate, consistent with acute ischemic infarct. Finding corresponds with abnormality on prior CT. Probable associated trace petechial hemorrhage (series 11, image 30). No frank hemorrhagic transformation or significant regional mass effect. An additional 5 mm focus of mild diffusion abnormality noted within the right periatrial white matter, consistent with a small subacute small vessel type infarct (series 5, image 71). No associated hemorrhage or mass effect. No other diffusion abnormality to suggest acute or subacute ischemia. Gray-white matter differentiation otherwise maintained. No acute intracranial hemorrhage. Subcentimeter chronic microhemorrhage noted at the posterior left temporal occipital region, of doubtful significance isolation (series 11, image 29). No mass lesion, midline shift or mass effect. No hydrocephalus or extra-axial fluid collection. Pituitary gland suprasellar region normal. Midline structures intact. Vascular: Major intracranial vascular flow voids are maintained. Skull and upper cervical spine: Craniocervical junction normal. Bone marrow signal intensity within normal limits. No scalp soft tissue abnormality. Sinuses/Orbits: Patient status post bilateral ocular lens  replacement. Globes and orbital soft tissues demonstrate no acute finding. Paranasal sinuses are largely clear. No significant mastoid effusion. Inner ear structures grossly normal. Other: None. MRA HEAD FINDINGS ANTERIOR CIRCULATION: Emanation examination degraded by motion artifact. Visualized distal cervical segments of the internal carotid arteries are somewhat tortuous but patent with antegrade flow. Petrous, cavernous, and supraclinoid segments widely patent without appreciable stenosis. A1 segments patent bilaterally. Normal anterior communicating artery complex.  Anterior cerebral arteries patent to their distal aspects without appreciable stenosis. No M1 stenosis or occlusion. Normal left MCA bifurcation. Right MCA trifurcations. Distal MCA branches well perfused and grossly symmetric. POSTERIOR CIRCULATION: Left V4 segment dominant. Mild atheromatous narrowing of the distal left V4 segment just prior to the vertebrobasilar junction. Left PICA origin patent and grossly normal. Right V4 segment patent without stenosis. Right PICA origin not seen. 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 Basilar otherwise widely patent. Superior cerebellar arteries grossly patent bilaterally. Both PCAs primarily supplied via the basilar, although a small right posterior communicating artery noted. PCAs well perfused to their distal aspects without appreciable stenosis. No aneurysm. MRA NECK FINDINGS AORTIC ARCH: Visualized aortic arch of normal caliber with normal 3 vessel morphology. No hemodynamically significant stenosis seen about the origin of the great vessels. Visualized subclavian arteries widely patent. RIGHT CAROTID SYSTEM: Right common and internal carotid arteries widely patent without stenosis, evidence for dissection, or occlusion. No significant atheromatous irregularity or narrowing about the right bifurcation. LEFT CAROTID SYSTEM: Left common and internal carotid arteries widely patent without stenosis, evidence for dissection or occlusion. No significant atheromatous narrowing or irregularity about the left carotid bifurcation. VERTEBRAL ARTERIES: Both vertebral arteries arise from the subclavian arteries. No proximal subclavian artery stenosis. Left vertebral artery dominant. Single short-segment moderate stenosis at the pre foraminal left V1 segment (series 1021, image 13). Vertebral arteries otherwise widely patent within the neck without stenosis, evidence for dissection or occlusion. IMPRESSION: MRI HEAD IMPRESSION:  1. 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 2. Additional 5 mm subacute ischemic nonhemorrhagic right periatrial white matter infarct. 3. 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 MRA HEAD IMPRESSION: 1. Motion degraded exam. 2. Negative intracranial MRA for large vessel occlusion. 3. 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 No other hemodynamically significant or correctable stenosis identified. MRA NECK IMPRESSION: 1. Wide patency of both carotid artery systems within the neck. 2. Single short-segment moderate stenosis involving the pre foraminal left V1 segment. Vertebral arteries otherwise widely patent within the neck. Left vertebral artery dominant. Electronically Signed   By: Jeannine Boga M.D.   On: 04/23/2020 21:43   MR ANGIO NECK W WO CONTRAST  Result Date: 04/23/2020 CLINICAL DATA:  Initial evaluation for acute stroke. EXAM: MRI HEAD WITHOUT CONTRAST MRA HEAD WITHOUT CONTRAST MRA NECK WITHOUT AND WITH CONTRAST TECHNIQUE: Multiplanar, multiecho pulse sequences of the brain and surrounding structures were obtained without intravenous contrast. Angiographic images of the Circle of Willis were obtained using MRA technique without intravenous contrast. Angiographic images of the neck were obtained using MRA technique without and with intravenous contrast. Carotid stenosis measurements (when applicable) are obtained utilizing NASCET criteria, using the distal internal carotid diameter as the denominator. CONTRAST:  105m GADAVIST GADOBUTROL 1 MMOL/ML IV SOLN COMPARISON:  Prior head CT from earlier the same day. FINDINGS: MRI  HEAD FINDINGS Brain: Examination degraded by motion artifact. Diffuse prominence of the CSF  containing spaces compatible generalized age-related cerebral atrophy. Patchy and confluent T2/FLAIR hyperintensity within the periventricular and deep white matter both cerebral hemispheres most consistent with chronic small vessel ischemic disease. Patchy involvement of the pons noted. Superimposed remote lacunar infarct present at the left paramedian pons. Additional small remote lacunar infarct present at the posterior right frontoparietal corona radiata (series 15, image 17). Approximate 3 cm focus of curvilinear restricted diffusion seen involving the left basal ganglia, extending from the left lentiform nucleus/subinsular white matter to the left caudate, consistent with acute ischemic infarct. Finding corresponds with abnormality on prior CT. Probable associated trace petechial hemorrhage (series 11, image 30). No frank hemorrhagic transformation or significant regional mass effect. An additional 5 mm focus of mild diffusion abnormality noted within the right periatrial white matter, consistent with a small subacute small vessel type infarct (series 5, image 71). No associated hemorrhage or mass effect. No other diffusion abnormality to suggest acute or subacute ischemia. Gray-white matter differentiation otherwise maintained. No acute intracranial hemorrhage. Subcentimeter chronic microhemorrhage noted at the posterior left temporal occipital region, of doubtful significance isolation (series 11, image 29). No mass lesion, midline shift or mass effect. No hydrocephalus or extra-axial fluid collection. Pituitary gland suprasellar region normal. Midline structures intact. Vascular: Major intracranial vascular flow voids are maintained. Skull and upper cervical spine: Craniocervical junction normal. Bone marrow signal intensity within normal limits. No scalp soft tissue abnormality. Sinuses/Orbits: Patient status post bilateral ocular lens replacement. Globes and orbital soft tissues demonstrate no acute  finding. Paranasal sinuses are largely clear. No significant mastoid effusion. Inner ear structures grossly normal. Other: None. MRA HEAD FINDINGS ANTERIOR CIRCULATION: E[place]ation examination degraded by motion artifact. Visualized distal cervical segments of the internal carotid arteries are somewhat tortuous but patent with antegrade flow. Petrous, cavernous, and supraclinoid segments widely patent without appreciable stenosis. A1 segments patent bilaterally. Normal anterior communicating artery complex. Anterior cerebral arteries patent to their distal aspects without appreciable stenosis. No M1 stenosis or occlusion. Normal left MCA bifurcation. Right MCA trifurcations. Distal MCA branches well perfused and grossly symmetric. POSTERIOR CIRCULATION: Left V4 segment dominant. Mild atheromatous narrowing of the distal left V4 segment just prior to the vertebrobasilar junction. Left PICA origin patent and grossly normal. Right V4 segment patent without stenosis. Right PICA origin not seen. 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 Basilar otherwise widely patent. Superior cerebellar arteries grossly patent bilaterally. Both PCAs primarily supplied via the basilar, although a small right posterior communicating artery noted. PCAs well perfused to their distal aspects without appreciable stenosis. No aneurysm. MRA NECK FINDINGS AORTIC ARCH: Visualized aortic arch of normal caliber with normal 3 vessel morphology. No hemodynamically significant stenosis seen about the origin of the great vessels. Visualized subclavian arteries widely patent. RIGHT CAROTID SYSTEM: Right common and internal carotid arteries widely patent without stenosis, evidence for dissection, or occlusion. No significant atheromatous irregularity or narrowing about the right bifurcation. LEFT CAROTID SYSTEM: Left common and internal carotid arteries widely patent without stenosis, evidence for  dissection or occlusion. No significant atheromatous narrowing or irregularity about the left carotid bifurcation. VERTEBRAL ARTERIES: Both vertebral arteries arise from the subclavian arteries. No proximal subclavian artery stenosis. Left vertebral artery dominant. Single short-segment moderate stenosis at the pre foraminal left V1 segment (series 1021, image 13). Vertebral arteries otherwise widely patent within the neck without stenosis, evidence for dissection or occlusion. IMPRESSION: MRI HEAD  IMPRESSION: 1. 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 2. Additional 5 mm subacute ischemic nonhemorrhagic right periatrial white matter infarct. 3. 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 MRA HEAD IMPRESSION: 1. Motion degraded exam. 2. Negative intracranial MRA for large vessel occlusion. 3. 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 No other hemodynamically significant or correctable stenosis identified. MRA NECK IMPRESSION: 1. Wide patency of both carotid artery systems within the neck. 2. Single short-segment moderate stenosis involving the pre foraminal left V1 segment. Vertebral arteries otherwise widely patent within the neck. Left vertebral artery dominant. Electronically Signed   By: Jeannine Boga M.D.   On: 04/23/2020 21:43   MR BRAIN WO CONTRAST  Result Date: 04/23/2020 CLINICAL DATA:  Initial evaluation for acute stroke. EXAM: MRI HEAD WITHOUT CONTRAST MRA HEAD WITHOUT CONTRAST MRA NECK WITHOUT AND WITH CONTRAST TECHNIQUE: Multiplanar, multiecho pulse sequences of the brain and surrounding structures were obtained without intravenous contrast. Angiographic images of the Circle of Willis were obtained using MRA  technique without intravenous contrast. Angiographic images of the neck were obtained using MRA technique without and with intravenous contrast. Carotid stenosis measurements (when applicable) are obtained utilizing NASCET criteria, using the distal internal carotid diameter as the denominator. CONTRAST:  60m GADAVIST GADOBUTROL 1 MMOL/ML IV SOLN COMPARISON:  Prior head CT from earlier the same day. FINDINGS: MRI HEAD FINDINGS Brain: Examination degraded by motion artifact. Diffuse prominence of the CSF containing spaces compatible generalized age-related cerebral atrophy. Patchy and confluent T2/FLAIR hyperintensity within the periventricular and deep white matter both cerebral hemispheres most consistent with chronic small vessel ischemic disease. Patchy involvement of the pons noted. Superimposed remote lacunar infarct present at the left paramedian pons. Additional small remote lacunar infarct present at the posterior right frontoparietal corona radiata (series 15, image 17). Approximate 3 cm focus of curvilinear restricted diffusion seen involving the left basal ganglia, extending from the left lentiform nucleus/subinsular white matter to the left caudate, consistent with acute ischemic infarct. Finding corresponds with abnormality on prior CT. Probable associated trace petechial hemorrhage (series 11, image 30). No frank hemorrhagic transformation or significant regional mass effect. An additional 5 mm focus of mild diffusion abnormality noted within the right periatrial white matter, consistent with a small subacute small vessel type infarct (series 5, image 71). No associated hemorrhage or mass effect. No other diffusion abnormality to suggest acute or subacute ischemia. Gray-white matter differentiation otherwise maintained. No acute intracranial hemorrhage. Subcentimeter chronic microhemorrhage noted at the posterior left temporal occipital region, of doubtful significance isolation (series 11, image 29).  No mass lesion, midline shift or mass effect. No hydrocephalus or extra-axial fluid collection. Pituitary gland suprasellar region normal. Midline structures intact. Vascular: Major intracranial vascular flow voids are maintained. Skull and upper cervical spine: Craniocervical junction normal. Bone marrow signal intensity within normal limits. No scalp soft tissue abnormality. Sinuses/Orbits: Patient status post bilateral ocular lens replacement. Globes and orbital soft tissues demonstrate no acute finding. Paranasal sinuses are largely clear. No significant mastoid effusion. Inner ear structures grossly normal. Other: None. MRA HEAD FINDINGS ANTERIOR CIRCULATION: Emanation examination degraded by motion artifact. Visualized distal cervical segments of the internal carotid arteries are somewhat tortuous but patent with antegrade flow. Petrous, cavernous, and supraclinoid segments widely patent without appreciable stenosis. A1 segments patent bilaterally. Normal anterior communicating artery complex. Anterior cerebral  arteries patent to their distal aspects without appreciable stenosis. No M1 stenosis or occlusion. Normal left MCA bifurcation. Right MCA trifurcations. Distal MCA branches well perfused and grossly symmetric. POSTERIOR CIRCULATION: Left V4 segment dominant. Mild atheromatous narrowing of the distal left V4 segment just prior to the vertebrobasilar junction. Left PICA origin patent and grossly normal. Right V4 segment patent without stenosis. Right PICA origin not seen. 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 Basilar otherwise widely patent. Superior cerebellar arteries grossly patent bilaterally. Both PCAs primarily supplied via the basilar, although a small right posterior communicating artery noted. PCAs well perfused to their distal aspects without appreciable stenosis. No aneurysm. MRA NECK FINDINGS AORTIC ARCH: Visualized aortic arch of normal  caliber with normal 3 vessel morphology. No hemodynamically significant stenosis seen about the origin of the great vessels. Visualized subclavian arteries widely patent. RIGHT CAROTID SYSTEM: Right common and internal carotid arteries widely patent without stenosis, evidence for dissection, or occlusion. No significant atheromatous irregularity or narrowing about the right bifurcation. LEFT CAROTID SYSTEM: Left common and internal carotid arteries widely patent without stenosis, evidence for dissection or occlusion. No significant atheromatous narrowing or irregularity about the left carotid bifurcation. VERTEBRAL ARTERIES: Both vertebral arteries arise from the subclavian arteries. No proximal subclavian artery stenosis. Left vertebral artery dominant. Single short-segment moderate stenosis at the pre foraminal left V1 segment (series 1021, image 13). Vertebral arteries otherwise widely patent within the neck without stenosis, evidence for dissection or occlusion. IMPRESSION: MRI HEAD IMPRESSION: 1. 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 2. Additional 5 mm subacute ischemic nonhemorrhagic right periatrial white matter infarct. 3. 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 MRA HEAD IMPRESSION: 1. Motion degraded exam. 2. Negative intracranial MRA for large vessel occlusion. 3. 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 No other hemodynamically significant or correctable stenosis identified. MRA NECK IMPRESSION: 1. Wide patency of both carotid artery systems within the neck. 2. Single short-segment moderate stenosis involving the pre foraminal left V1 segment. Vertebral arteries otherwise widely patent within the neck. Left vertebral  artery dominant. Electronically Signed   By: Jeannine Boga M.D.   On: 04/23/2020 21:43   ECHOCARDIOGRAM COMPLETE  Result Date: 04/24/2020    ECHOCARDIOGRAM REPORT   Patient Name:   Edenilson Austad. Date of Exam: 04/24/2020 Medical Rec #:  [id number]               Height:       71.0 in Accession #:    [id number]              Weight:       186.1 lb Date of Birth:  [birth year]               BSA:          2.045 m Patient Age:    53 years                BP:           166/95 mmHg Patient Gender: M                       HR:           87 bpm. Exam Location:  Inpatient Procedure: 2D Echo, Cardiac Doppler  and Color Doppler Indications:    Stroke 434.91 / I163.9  History:        Patient has no prior history of Echocardiogram examinations.                 Signs/Symptoms:Dyspnea; Risk Factors:Hypertension, Diabetes and                 Dyslipidemia. Cancer. GERD.  Sonographer:    Jonelle Sidle Dance Referring Phys: [id number] Amaya  1. Left ventricular ejection fraction, by estimation, is 60 to 65%. The left ventricle has normal function. The left ventricle has no regional wall motion abnormalities. There is mild concentric left ventricular hypertrophy. Left ventricular diastolic parameters are consistent with Grade I diastolic dysfunction (impaired relaxation).  2. Right ventricular systolic function is normal. The right ventricular size is normal.  3. The mitral valve is normal in structure. Mild mitral valve regurgitation. No evidence of mitral stenosis.  4. The aortic valve is normal in structure. Aortic valve regurgitation is not visualized. No aortic stenosis is present.  5. The inferior vena cava is normal in size with greater than 50% respiratory variability, suggesting right atrial pressure of 3 mmHg. Conclusion(s)/Recommendation(s): No intracardiac source of embolism detected on this transthoracic study. A transesophageal echocardiogram is recommended to exclude cardiac source of embolism if  clinically indicated. FINDINGS  Left Ventricle: Left ventricular ejection fraction, by estimation, is 60 to 65%. The left ventricle has normal function. The left ventricle has no regional wall motion abnormalities. The left ventricular internal cavity size was normal in size. There is  mild concentric left ventricular hypertrophy. Left ventricular diastolic parameters are consistent with Grade I diastolic dysfunction (impaired relaxation). Normal left ventricular filling pressure. Right Ventricle: The right ventricular size is normal. No increase in right ventricular wall thickness. Right ventricular systolic function is normal. Left Atrium: Left atrial size was normal in size. Right Atrium: Right atrial size was normal in size. Pericardium: There is no evidence of pericardial effusion. Mitral Valve: The mitral valve is normal in structure. Mild mitral valve regurgitation. No evidence of mitral valve stenosis. Tricuspid Valve: The tricuspid valve is normal in structure. Tricuspid valve regurgitation is mild . No evidence of tricuspid stenosis. Aortic Valve: The aortic valve is normal in structure. Aortic valve regurgitation is not visualized. No aortic stenosis is present. Pulmonic Valve: The pulmonic valve was normal in structure. Pulmonic valve regurgitation is not visualized. No evidence of pulmonic stenosis. Aorta: The aortic root is normal in size and structure. Venous: The inferior vena cava is normal in size with greater than 50% respiratory variability, suggesting right atrial pressure of 3 mmHg. IAS/Shunts: No atrial level shunt detected by color flow Doppler.  LEFT VENTRICLE PLAX 2D LVIDd:         4.20 cm  Diastology LVIDs:         3.30 cm  LV e' medial:    6.64 cm/s LV PW:         1.10 cm  LV E/e' medial:  7.4 LV IVS:        1.10 cm  LV e' lateral:   6.96 cm/s LVOT diam:     2.20 cm  LV E/e' lateral: 7.0 LV SV:         63 LV SV Index:   31 LVOT Area:     3.80 cm  RIGHT VENTRICLE            IVC RV Basal  diam:  3.00 cm  IVC diam: 1.90 cm RV S prime:     8.49 cm/s TAPSE (M-mode): 1.7 cm LEFT ATRIUM             Index       RIGHT ATRIUM           Index LA diam:        3.20 cm 1.56 cm/m  RA Area:     12.30 cm LA Vol (A2C):   67.1 ml 32.81 ml/m RA Volume:   28.30 ml  13.84 ml/m LA Vol (A4C):   53.9 ml 26.36 ml/m LA Biplane Vol: 62.0 ml 30.32 ml/m  AORTIC VALVE LVOT Vmax:   86.20 cm/s LVOT Vmean:  55.300 cm/s LVOT VTI:    0.166 m  AORTA Ao Root diam: 3.40 cm Ao Asc diam:  3.30 cm MITRAL VALVE MV Area (PHT): 2.10 cm    SHUNTS MV Decel Time: 361 msec    Systemic VTI:  0.17 m MV E velocity: 49.05 cm/s  Systemic Diam: 2.20 cm MV A velocity: 63.40 cm/s MV E/A ratio:  0.77 Ena Dawley MD Electronically signed by Ena Dawley MD Signature Date/Time: 04/24/2020/11:59:21 AM    Final    CT HEAD CODE STROKE WO CONTRAST  Result Date: 04/23/2020 CLINICAL DATA:  Code stroke. Acute neuro deficit. Right facial droop and confusion EXAM: CT HEAD WITHOUT CONTRAST TECHNIQUE: Contiguous axial images were obtained from the base of the skull through the vertex without intravenous contrast. COMPARISON:  CT head 11/07/2018 FINDINGS: Brain: New area of ill-defined hypodensity in the left lenticular nucleus likely a subacute infarct. New hypodensity in the left paramedian pons which is relatively well defined and likely is a chronic lacunar infarct but could be subacute. Generalized atrophy. Chronic microvascular ischemic changes in the white matter are relatively mild. Negative for hemorrhage or mass. Ventricle size normal. Vascular: Negative for hyperdense vessel Skull: Negative Sinuses/Orbits: Paranasal sinuses clear. Bilateral cataract extraction. Other: None ASPECTS ([place] Stroke Program Early CT Score) - Ganglionic level infarction (caudate, lentiform nuclei, internal capsule, insula, M1-M3 cortex): 6 - Supraganglionic infarction (M4-M6 cortex): 3 Total score (0-10 with 10 being normal): 9 IMPRESSION: 1. New ill-defined  hypodensity left lenticular nucleus suspicious for subacute infarct. 2. New hypodensity in the left paramedian pons consistent with acute infarction which is likely chronic but could be recent. 3. Negative for hemorrhage 4. ASPECTS is 9 5. Code stroke imaging results were communicated on 04/23/2020 at 5:36 pm to provider Lorrin Goodell via text page Electronically Signed   By: Franchot Gallo M.D.   On: 04/23/2020 17:39        Scheduled Meds: . aspirin EC  81 mg Oral Daily  . atorvastatin  40 mg Oral Daily  . clopidogrel  75 mg Oral Daily  . enoxaparin (LOVENOX) injection  40 mg Subcutaneous Q24H  . insulin aspart  0-9 Units Subcutaneous TID WC  . LORazepam  0-4 mg Intravenous Q6H   Or  . LORazepam  0-4 mg Oral Q6H  . nicotine  21 mg Transdermal Daily  . thiamine  100 mg Oral Daily   Or  . thiamine  100 mg Intravenous Daily   Continuous Infusions:   LOS: 0 days    Time spent: 39 minutes spent on chart review, discussion with nursing staff, consultants, updating family and interview/physical exam; more than 50% of that time was spent in counseling and/or coordination of care.    Daven Pinckney J [place] DO [facility] Available via Epic secure chat 7am-7pm After these hours,  please refer to coverage provider listed on [email or web address] 04/24/2020, 2:50 PM

## 2020-04-25 DIAGNOSIS — E11319 Type 2 diabetes mellitus with unspecified diabetic retinopathy without macular edema: Secondary | ICD-10-CM

## 2020-04-25 DIAGNOSIS — Z7289 Other problems related to lifestyle: Secondary | ICD-10-CM

## 2020-04-25 DIAGNOSIS — I152 Hypertension secondary to endocrine disorders: Secondary | ICD-10-CM

## 2020-04-25 DIAGNOSIS — I639 Cerebral infarction, unspecified: Secondary | ICD-10-CM | POA: Diagnosis not present

## 2020-04-25 DIAGNOSIS — E119 Type 2 diabetes mellitus without complications: Secondary | ICD-10-CM

## 2020-04-25 DIAGNOSIS — E1159 Type 2 diabetes mellitus with other circulatory complications: Secondary | ICD-10-CM

## 2020-04-25 DIAGNOSIS — Z720 Tobacco use: Secondary | ICD-10-CM

## 2020-04-25 DIAGNOSIS — I10 Essential (primary) hypertension: Secondary | ICD-10-CM | POA: Diagnosis not present

## 2020-04-25 LAB — URINALYSIS, ROUTINE W REFLEX MICROSCOPIC
Bilirubin Urine: NEGATIVE
Glucose, UA: 50 mg/dL — AB
Ketones, ur: NEGATIVE mg/dL
Leukocytes,Ua: NEGATIVE
Nitrite: NEGATIVE
Protein, ur: >=300 mg/dL — AB
Specific Gravity, Urine: 1.01 (ref 1.005–1.030)
pH: 6 (ref 5.0–8.0)

## 2020-04-25 LAB — BASIC METABOLIC PANEL
Anion gap: 4 — ABNORMAL LOW (ref 5–15)
BUN: 27 mg/dL — ABNORMAL HIGH (ref 8–23)
CO2: 28 mmol/L (ref 22–32)
Calcium: 8.6 mg/dL — ABNORMAL LOW (ref 8.9–10.3)
Chloride: 108 mmol/L (ref 98–111)
Creatinine, Ser: 1.66 mg/dL — ABNORMAL HIGH (ref 0.61–1.24)
GFR, Estimated: 44 mL/min — ABNORMAL LOW (ref >60)
Glucose, Bld: 110 mg/dL — ABNORMAL HIGH (ref 70–99)
Potassium: 3.2 mmol/L — ABNORMAL LOW (ref 3.5–5.1)
Sodium: 140 mmol/L (ref 135–145)

## 2020-04-25 LAB — RAPID URINE DRUG SCREEN, HOSP PERFORMED
Amphetamines: NOT DETECTED
Barbiturates: NOT DETECTED
Benzodiazepines: POSITIVE — AB
Cocaine: NOT DETECTED
Opiates: NOT DETECTED
Tetrahydrocannabinol: NOT DETECTED

## 2020-04-25 LAB — GLUCOSE, CAPILLARY
Glucose-Capillary: 88 mg/dL (ref 70–99)
Glucose-Capillary: 170 mg/dL — ABNORMAL HIGH (ref 70–99)
Glucose-Capillary: 138 mg/dL — ABNORMAL HIGH (ref 70–99)

## 2020-04-25 LAB — MAGNESIUM: Magnesium: 1.8 mg/dL (ref 1.7–2.4)

## 2020-04-25 MED ORDER — MAGNESIUM SULFATE 2 GM/50ML IV SOLN
2.0000 g | Freq: Once | INTRAVENOUS | Status: AC
  Administered 2020-04-25: 2 g via INTRAVENOUS
  Filled 2020-04-25: qty 50

## 2020-04-25 MED ORDER — ASPIRIN 81 MG PO TBEC: 81.0000 mg | DELAYED_RELEASE_TABLET | Freq: Every day | ORAL | 0 refills | Status: AC

## 2020-04-25 MED ORDER — CLOPIDOGREL BISULFATE 75 MG PO TABS: 75.0000 mg | ORAL_TABLET | Freq: Every day | ORAL | 0 refills | Status: AC

## 2020-04-25 MED ORDER — AMLODIPINE BESYLATE 5 MG PO TABS
5.0000 mg | ORAL_TABLET | Freq: Every day | ORAL | Status: DC
  Administered 2020-04-25: 5 mg via ORAL
  Filled 2020-04-25: qty 1

## 2020-04-25 MED ORDER — POTASSIUM CHLORIDE CRYS ER 20 MEQ PO TBCR
40.0000 meq | EXTENDED_RELEASE_TABLET | ORAL | Status: AC
  Administered 2020-04-25 (×2): 40 meq via ORAL
  Filled 2020-04-25 (×2): qty 2

## 2020-04-25 MED ORDER — HYDRALAZINE HCL 20 MG/ML IJ SOLN
10.0000 mg | Freq: Once | INTRAMUSCULAR | Status: AC
  Administered 2020-04-25: 10 mg via INTRAVENOUS
  Filled 2020-04-25: qty 1

## 2020-04-25 MED ORDER — ATORVASTATIN CALCIUM 40 MG PO TABS: 40.0000 mg | ORAL_TABLET | Freq: Every day | ORAL | 0 refills | Status: DC

## 2020-04-25 NOTE — Progress Notes (Addendum)
STROKE TEAM PROGRESS NOTE   SUBJECTIVE (INTERVAL HISTORY) Patient is sitting in the chair and working with speech therapist.  No new neurological events overnight.  He has refused loop recorder.  Therapy has  recommended CIR or SNF which is refusing.  There are no voiced concerns from him  OBJECTIVE Vitals:   04/25/20 0057 04/25/20 0329 04/25/20 0800 04/25/20 1142  BP: (!) 173/106 (!) 157/73 (!) 167/89 (!) 153/95  Pulse: 95 99 (!) 107 (!) 107  Resp: 16 18 18 18   Temp:  97.6 F (36.4 C) 97.9 F (36.6 C) 98.1 F (36.7 C)  TempSrc:  Oral Oral Oral  SpO2: 100% 100% 100% 100%  Weight:      Height:        Lipid Panel:  Recent Labs  Lab 04/24/20 0435  CHOL 141  TRIG 105  HDL 38*  CHOLHDL 3.7  VLDL 21  LDLCALC 82   HgbA1c:  Lab Results  Component Value Date   HGBA1C 6.5 (H) 04/24/2020   Urine Drug Screen:     Component Value Date/Time   LABOPIA NONE DETECTED 04/25/2020 1243   COCAINSCRNUR NONE DETECTED 04/25/2020 1243   LABBENZ POSITIVE (A) 04/25/2020 1243   AMPHETMU NONE DETECTED 04/25/2020 1243   THCU NONE DETECTED 04/25/2020 1243   LABBARB NONE DETECTED 04/25/2020 1243    Alcohol Level     Component Value Date/Time   ETH <10 11/07/2018 2154    IMAGING  CT head 1. New ill-defined hypodensity left lenticular nucleus suspicious for subacute infarct. 2. New hypodensity in the left paramedian pons consistent with acute infarction which is likely chronic but could be recent. 3. Negative for hemorrhage  MRI Brain  1. 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 2. Additional 5 mm subacute ischemic nonhemorrhagic right periatrial white matter infarct. 3. 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  MRA head    1. Motion degraded exam. 2. Negative intracranial MRA for large vessel occlusion. 3. 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 No other hemodynamically significant or correctable stenosis identified.  MRA Neck  1. Wide patency of both carotid artery systems within the neck. 2. Single short-segment moderate stenosis involving the pre foraminal left V1 segment. Vertebral arteries otherwise widely patent within the neck. Left vertebral artery dominant.  PHYSICAL EXAM  Physical Exam  Constitutional: Appears well-developed and well-nourished elderly African-American male.  Psych: Affect appropriate to situation Eyes: No scleral injection HENT: No OP obstrucion MSK: no joint deformities.  Cardiovascular: Normal rate and regular rhythm.  Respiratory: Effort normal, non-labored breathing GI: Soft.  No distension. There is no tenderness.  Skin: WDI  Neuro: Mental Status: Patient is awake, alert, oriented to person, place, month, year, and situation. Patient is able to give a clear and coherent history. No signs of aphasia or neglect Cranial Nerves: II: Visual Fields are full. Pupils are equal, round, and reactive to light.   III,IV, VI: EOMI without ptosis or diploplia.  V: Facial sensation is symmetric to temperature VII: Facial movement is asymmetric.  VIII: hearing is intact to voice X: Uvula elevates symmetrically XI: Shoulder shrug is symmetric. XII: tongue is deviated to right without atrophy or fasciculations.  Motor: Tone is normal. Bulk is normal. 5/5 strength on left arm, left leg and right leg. Right arm is 3/5 significant weakness of  right grip and intrinsic hand muscles.  Orbits left or right upper extremity.  Trace weakness of right ankle dorsiflexors and hip flexors. Sensory: Decreased on right  Cerebellar: FNF ataxia on right    NIHSS 1a Level of Conscious.: 0 1b LOC Questions: 0 1c LOC Commands: 0 2 Best Gaze: 0 3 Visual: 0 4  Facial Palsy: 2 5a Motor Arm - left: 0 5b Motor Arm - Right: 0 6a Motor Leg - Left: 0 6b Motor Leg - Right: 0 7 Limb Ataxia: 1 8 Sensory: 1 9 Best Language: 0 10 Dysarthria: 1 11 Extinct. and Inatten.: 0 TOTAL: 5  ASSESSMENT/PLAN [name] is a 75 y.o. male with history of PMHx of prostate cancer, HTN, HLD, substance abuse, ? COPD, and DM II with retinopathy. Pt admits to 2 liquor drinks a day and no ETOH x 3 days.  Patient is brought in to the ED via EMS for being found walking around the parking lot confused. EMS reported right facial droop and confusion to orientation questions. NIHSS 3   Stroke:  left Basal Ganglia  Infarct to0 large large for lacune and likely cryptogenic etiology  2D Echo  EF 60-65% No intracardiac source of embolism  detected on this transthoracic study.   LDL 82  HgbA1c 6.5  SCD's and lovenox 40 mg for VTE prophylaxis Diet Order            Diet - low sodium heart healthy           Diet heart healthy/carb modified Room service appropriate? Yes; Fluid consistency: Thin  Diet effective now                 No antithrombotic prior to admission, now on aspirin 81 mg daily and clopidogrel 75 mg daily. Continue both for 3 weeks then ASA only   Patient counseled to be compliant with his antithrombotic medications  Ongoing aggressive stroke risk factor management  Therapy recommendations:  CIR or SNF Disposition:  Pending   Hypertension  Stable .  Permissive hypertension (OK if < 220/120) but gradually normalize in 5-7 days . BP 143/73-198/110 . Home meds on hold  .  Long-term BP goal normotensive  Hyperlipidemia  Home meds: not on meds    LDL 82, goal < 70  Add lipitor 40 mg   Continue statin at discharge  Diabetes type II  HgbA1c 6.5, goal < 7.0  Other Stroke Risk Factors  Advanced age  Cigarette smoker  -advised to stop smoking  ETOH use, advised to drink no more than 1 drink(s) a day  Coronary artery  disease   Other Active Problems  Hospital day # 1 Continue aspirin Plavix for 3 weeks followed by aspirin alone.  Patient may not qualify for inpatient rehab since he does not have 24-hour supervision at home and may likely need skilled nursing facility.  Follow-up with outpatient stroke clinic in 6 weeks.  Stroke team will sign off.  Kindly call for questions. Antony Contras, MD

## 2020-04-25 NOTE — Consult Note (Signed)
Gregory Nunez. is a 75 y.o. male with medical history significant for type 2 diabetes with diabetic retinopathy, hypertension, hyperlipidemia, prostate cancer, and alcohol use who presents to the ED for evaluation of right-sided weakness.  History somewhat limited from patient due to inability to recall recent events and is otherwise supplemented by EDP and chart review.  Patient states he has been having a hard time the last few days but does not specify what has been bothering him.  He says he has not been eating or drinking well.  He admits that he has not been taking his medications for some time.  He says he has had some weakness in his right leg.  Per ED documentation patient was found wandering around a local [facility].  EMS were called and he had reported right-sided facial droop and altered mental status therefore was brought to the ED for further evaluation.  There was concern for excessive alcohol use from prior providers.  Upon questioning, patient states that his last drink was 12 days ago providing an inconsistent history.  He does report chronic tobacco use of 1.5 packs/day.   Psych consult placed to assess capacity to make medical decisions. Writer seen and began assessment with patient, he was observed sitting in his chair eating lunch. Writer introduced self and reason for that visit. It was at that time he disclosed to me that he no longer wanted to go home and realized therapy was the best thing for him. " this morning I was hell of bent on going home. But after working with PT I realized Im not ready to go home. I have decided to go to rehab, and I realized that is what's best for me. " Writer continued assessment with patient to just ensure he had proper insight and judgement in which he was determined. Patent does appear to have understanding, insight, judgement, and knowledge. Patient is alert and oriented and appears to answer all questions appropriately.   Patient does appear  to have capacity to make medical decisions at his this time as evident by his ability to make the right decision and receive additional care and rehabilitation services at [facility].   -Psychiatry to sign off at this time.

## 2020-04-25 NOTE — Progress Notes (Signed)
  Speech Language Pathology Treatment: Dysphagia;Cognitive-Linquistic  Patient Details Name: Gregory Nunez. MRN: [id number] DOB: 11-22-1945 Today's Date: 04/25/2020 Time: 7322-0254 SLP Time Calculation (min) (ACUTE ONLY): 30 min  Assessment / Plan / Recommendation Clinical Impression  Pt seen for dysarthria treatment with use of slow, precise articulation via pausing, over-articulating words and opening mouth for increased intelligibility to 85% overall during speaking tasks.  Pt demonstrated simple problem solving for functional tasks (ie: walking, eating, etc.) indicating need for walker and use of swallowing strategies with min verbal cues provided by SLP.       Dysphagia tx provided with intake of thin via cup/straw with min verbal cues provided and delayed cough noted with larger volumes of liquid via straw, so straws should continue to be eliminated d/t decreased sensory awareness and probable silent aspiration with increased volume.  Right weakness min improved today as anterior loss less and pt able to sense material on right side of face after intake of liquids.  Education provided re: aspiration precautions needed during intake with pt agreement and teach back utilized.  Due to pt's cognitive iimpairment, he remains at a min risk for aspiration if swallowing prcautions are not adhered to during intake.   HPI HPI: 75 y.o. male with medical history significant for type 2 diabetes with diabetic retinopathy, hypertension, hyperlipidemia, prostate cancer, and alcohol use who presents to the ED for evaluation of right-sided weakness      SLP Plan  Continue with current plan of care       Recommendations  Diet recommendations: Dysphagia 3 (mechanical soft);Thin liquid Liquids provided via: Cup;No straw Medication Administration: Whole meds with puree Supervision: Patient able to self feed;Intermittent supervision to cue for compensatory strategies Compensations: Slow rate;Small  sips/bites;Minimize environmental distractions;Lingual sweep for clearance of pocketing                Oral Care Recommendations: Oral care BID Follow up Recommendations: Other (comment) (TBD) SLP Visit Diagnosis: Dysphagia, oropharyngeal phase (R13.12);Dysarthria and anarthria (R47.1);Attention and concentration deficit;Cognitive communication deficit (R41.841) Attention and concentration deficit following: Cerebral infarction Plan: Continue with current plan of care                      Elvina Sidle, M.S., CCC-SLP 04/25/2020, 1:39 PM

## 2020-04-25 NOTE — TOC Initial Note (Signed)
Transition of Care (TOC) - Initial/Assessment Note    Patient Details  Name: Gregory Nunez. MRN: [id number] Date of Birth: 01-09-1946  Transition of Care [facility]) CM/SW Contact:    Pollie Friar, RN Phone Number: 04/25/2020, 1:50 PM  Clinical Narrative:                 Pt is from a senior apartment where he is alone. He has no 24 hour supervision. Per therapies he is a high falls risk and has major cognitive deficits. CM spoke to patient and then his nephew over the phone. Currently the plan is for SNF rehab. CM has faxed him out and choice will need to be provided to nephew. The nephew plans to visit later today to go over everything with the patient.  TOC following.  Expected Discharge Plan: Skilled Nursing Facility Barriers to Discharge: SNF Pending bed offer,Insurance Authorization   Patient Goals and CMS Choice   CMS Medicare.gov Compare Post Acute Care list provided to:: Patient Represenative (must comment) Choice offered to / list presented to :  (nephew)  Expected Discharge Plan and Services Expected Discharge Plan: Skilled Nursing Facility In-house Referral: Clinical Social Work Discharge Planning Services: CM Consult Post Acute Care Choice: [place] Living arrangements for the past 2 months: Apartment Expected Discharge Date: 04/25/20                                    Prior Living Arrangements/Services Living arrangements for the past 2 months: Apartment Lives with:: Self Patient language and need for interpreter reviewed:: Yes Do you feel safe going back to the place where you live?: Yes      Need for Family Participation in Patient Care: Yes (Comment) Care giver support system in place?: No (comment)   Criminal Activity/Legal Involvement Pertinent to Current Situation/Hospitalization: No - Comment as needed  Activities of Daily Living Home Assistive Devices/Equipment: None ADL Screening (condition at time of  admission) Patient's cognitive ability adequate to safely complete daily activities?: Yes Is the patient deaf or have difficulty hearing?: No Does the patient have difficulty seeing, even when wearing glasses/contacts?: No Does the patient have difficulty concentrating, remembering, or making decisions?: Yes Patient able to express need for assistance with ADLs?: Yes Does the patient have difficulty dressing or bathing?: No Independently performs ADLs?: Yes (appropriate for developmental age) Does the patient have difficulty walking or climbing stairs?: Yes Weakness of Legs: Right Weakness of Arms/Hands: Right  Permission Sought/Granted                  Emotional Assessment Appearance:: Appears stated age Attitude/Demeanor/Rapport: Inconsistent Affect (typically observed): In denial Orientation: : Oriented to Self   Psych Involvement: Yes (comment)  Admission diagnosis:  Right sided weakness [R53.1] Acute CVA (cerebrovascular accident) [facility]) [I63.9] Patient Active Problem List   Diagnosis Date Noted  . Tobacco abuse   . 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 ([place])   . Acute CVA (cerebrovascular accident) ([place]) 04/23/2020  . Hyperlipidemia associated with type 2 diabetes mellitus ([place]) 04/23/2020  . Alcohol use 04/23/2020  . AKI (acute kidney injury) ([place]) 04/23/2020  . Tobacco use 04/23/2020  . Unresponsive 11/08/2018  . Syncope, convulsive 11/08/2018  . Hypertension associated with diabetes ([place]) 11/08/2018  . Alcohol withdrawal seizure with complication, with unspecified complication ([place]) 13/11/6576  . Type 2 diabetes mellitus without complication ([place]) 46/96/2952  . Symptomatic  anemia 04/15/2018  . Prostate cancer ([place]) 02/27/2014  . Malignant neoplasm of prostate ([place]) 01/09/2014  . Bunion 01/17/2013  . 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

## 2020-04-25 NOTE — Progress Notes (Signed)
Inpatient Rehab Admissions Coordinator:   Per TOC note, no 24/7 available to pt, which would preclude him from being a CIR candidate at this time as his goals are for supervision level.  We will sign off.    Shann Medal, PT, DPT Admissions Coordinator [phone] 04/25/20  2:25 PM

## 2020-04-25 NOTE — Progress Notes (Addendum)
Occupational Therapy Treatment Patient Details Name: Gregory Nunez. MRN: [id number] DOB: Oct 02, 1945 Today's Date: 04/25/2020    History of present illness Pt is a 75 year old male with a medical hx significant for dyspnea, alcohol use, prostate cancer, pancreatitis, HTN, HAV, DM2, and depression who presented with R-sided weakness, R-sided facial droop, and AMS when [facility] a local [facility]. 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 They also showed superimposed remote lacunar infarcts involving the L paramedian pons and posterior R frontoparietal corona radiata. NIHSS of 3 and tPA not administered   OT comments  Pt received sitting in recliner, attempting to call his family on the room phone. Pt required max cues to turn phone on, dial number, and turn phone off when finished with conversation. During conversation with family, pt demonstrated limitations with word finding, memory, problem solving and awareness. He scored a 2/10 on the medicog indicating cognitive limitations that are required for safe completion of medication management and cooking. Pt required modA to powerup into standing from EOB and from recliner. He had reports of dizziness after 5 feet ambulation requiring sitting on EOB. Pt with decreased awareness of deficits and of safety. Due to pt's cognitive and physical limitations, he is unsafe to d/c home alone at this time. Continue to recommend d/c to CIR. If pt is unable to d/c to CIR, recommend d/c to SNF with 24/7 physical assistance.   Spoke with pt's nephew, Legrand Como, on the phone. Family is agreeable that pt get extra therapy before discharging home. Legrand Como reports pt may be able to stay with pt's sister following d/c and plans to discuss this with pt's sister.   BP sitting in recliner 143/94 HR 122;BP after standing 68min 120/79  with c/o dizziness, BP 135/78 HR 129 with return to sitting in recliner  Follow Up Recommendations  CIR    Equipment Recommendations  3 in 1 bedside commode    Recommendations for Other Services Rehab consult    Precautions / Restrictions Precautions Precautions: Fall Precaution Comments: monitor BP (HTN and orthostatic hypotension) Restrictions Weight Bearing Restrictions: No       Mobility Bed Mobility Overal bed mobility: Needs Assistance Bed Mobility: Supine to Sit     Supine to sit: Min guard;HOB elevated     General bed mobility comments: pt sitting in recliner upon arrival  Transfers Overall transfer level: Needs assistance Equipment used: Rolling walker (2 wheeled) Transfers: Sit to/from [facility] Sit to Stand: Mod assist Stand pivot transfers: Mod assist      Lateral/Scoot Transfers: Min guard General transfer comment: pt with 1x loss of balance with initial standing returning to sitting due to inability to powerup into standing;pt required modA to powerup from EOB and assist to stabilize4 in stnaidng, pt required modA to navigate around obstacles in room    Balance Overall balance assessment: Needs assistance Sitting-balance support: Feet supported;No upper extremity supported Sitting balance-Leahy Scale: Fair Sitting balance - Comments: no UE support, min gaurd for safety sitting statically EOB. Postural control: Posterior lean Standing balance support: Bilateral upper extremity supported;During functional activity Standing balance-Leahy Scale: Poor Standing balance comment: heavy reliance on BUE support in standing, posterior loss of balance                           ADL either performed or assessed with  clinical judgement   ADL Overall ADL's : Needs assistance/impaired Eating/Feeding: Set up   Grooming: Moderate assistance;Standing Grooming Details (indicate cue type and reason): modA stand at sink level;          Upper Body Dressing : Minimal assistance;Sitting Upper Body Dressing Details (indicate cue type and reason): To don hospital gown. Lower Body Dressing: Moderate assistance;Sit to/from stand Lower Body Dressing Details (indicate cue type and reason): Mod A to doff/don footwear seated EOB. Patient attemptes to use affected RUE but difficulty noted 2/2 decreased coordination/strength.             Functional mobility during ADLs: Moderate assistance;Rolling walker General ADL Comments: pt limited with standing tolerance secondary to BP and c/o dizziness, pt required modA to navigate obstacles in the environment with his RW     Vision   Vision Assessment?: Vision impaired- to be further tested in functional context Additional Comments: will continue to assess, difficult to assess secondary to cognitive deficits   Perception     Praxis      Cognition Arousal/Alertness: Awake/alert Behavior During Therapy: Agitated;Impulsive Overall Cognitive Status: Impaired/Different from baseline Area of Impairment: Attention;Following commands;Safety/judgement;Memory;Problem solving;Awareness;Orientation                 Orientation Level: Disoriented to;Time (stated month was September) Current Attention Level: Sustained Memory: Decreased short-term memory Following Commands: Follows one step commands consistently;Follows one step commands with increased time;Follows multi-step commands inconsistently Safety/Judgement: Decreased awareness of safety;Decreased awareness of deficits Awareness: Emergent Problem Solving: Slow processing;Difficulty sequencing;Requires verbal cues General Comments: Came into room initially due to pt impulsively trying to get OOB, x2 reps, despite cues otherwise. Pt perseverating on calling nephew and going home and getting agitated with idea of staying in hospital longer. Poor word finding, having to change statements or numbers often. STM deficits and poor safety  awareness and awareness of deficits as pt required repeated cues and extra time to proccess the cues to respond correctly and even attempting to avoid recognizing his difficulty with ambulating and his decreased balance stating he would not fall at home yet he would lose balance during session.        Exercises     Shoulder Instructions       General Comments BP sitting in recliner 143/94 HR 122;BP after standing 75min 120/79 with c/o dizziness, BP 135/78 HR 129 with return to sitting in recliner    Pertinent Vitals/ Pain       Pain Assessment: No/denies pain  Home Living                                          Prior Functioning/Environment              Frequency  Min 2X/week        Progress Toward Goals  OT Goals(current goals can now be found in the care plan section)  Progress towards OT goals: Progressing toward goals  Acute Rehab OT Goals Patient Stated Goal: to go home OT Goal Formulation: With patient Time For Goal Achievement: 05/08/20 Potential to Achieve Goals: Good ADL Goals Pt Will Perform Grooming: with modified independence;standing Pt Will Perform Upper Body Dressing: with modified independence;sitting Pt Will Perform Lower Body Dressing: with modified independence;sit to/from stand Pt Will Transfer to Toilet: with supervision;bedside commode;ambulating Pt Will Perform Toileting - Clothing Manipulation and hygiene: with supervision;sit to/from stand Pt  Will Perform Tub/Shower Transfer: Shower transfer;3 in 1;grab bars;rolling walker Additional ADL Goal #1: Patient will complete RUE NMR HEP with use of written handout in prep for ADLs.  Plan Discharge plan remains appropriate    Co-evaluation                 AM-PAC OT "6 Clicks" Daily Activity     Outcome Measure   Help from another person eating meals?: A Little Help from another person taking care of personal grooming?: A Little Help from another person toileting,  which includes using toliet, bedpan, or urinal?: A Little Help from another person bathing (including washing, rinsing, drying)?: A Lot Help from another person to put on and taking off regular upper body clothing?: A Little Help from another person to put on and taking off regular lower body clothing?: A Lot 6 Click Score: 16    End of Session Equipment Utilized During Treatment: Gait belt;Rolling walker  OT Visit Diagnosis: Muscle weakness (generalized) (M62.81);Ataxia, unspecified (R27.0);Other symptoms and signs involving the nervous system (R29.898);Hemiplegia and hemiparesis;Unsteadiness on feet (R26.81) Hemiplegia - Right/Left: Right Hemiplegia - dominant/non-dominant: Dominant Hemiplegia - caused by: Cerebral infarction   Activity Tolerance Patient tolerated treatment well   Patient Left with call bell/phone within reach;in chair;with chair alarm set   Nurse Communication Mobility status        Time: 4888-9169 OT Time Calculation (min): 38 min  Charges: OT General Charges $OT Visit: 1 Visit OT Treatments $Self Care/Home Management : 8-22 mins $Cognitive Funtion inital: Initial 15 mins $Cognitive Funtion additional: Additional15 mins  Helene Kelp OTR/L Acute Rehabilitation Services Office: [place] 04/25/2020, 1:24 PM

## 2020-04-25 NOTE — Progress Notes (Signed)
Physical Therapy Treatment Patient Details Name: Gregory Nunez. MRN: [id number] DOB: 1946-04-15 Today's Date: 04/25/2020    History of Present Illness Pt is a 75 year old male with a medical hx significant for dyspnea, alcohol use, prostate cancer, pancreatitis, HTN, HAV, DM2, and depression who presented with R-sided weakness, R-sided facial droop, and AMS when found wandering around a local [facility]. 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 They also showed superimposed remote lacunar infarcts involving the L paramedian pons and posterior R frontoparietal corona radiata. NIHSS of 3 and tPA not administered    PT Comments    Pt demonstrates difficulty finding words and significant cognitive deficits that place him at risk for falls and injuries. Initially entered room due to pt impulsively trying to get OOB x2 reps, despite cues otherwise. Pt agitated and perseverating on calling nephew and going home throughout. Pt demonstrates significantly poor awareness of his deficits that impact his safety as he was allowed to ambulate a short distance with a RW with time provided for pt to attempt to react to bouts of LOB, but pt displayed delayed-no reactional strategies resulting in him requiring modA to recover his balance. Pt did not notice he drags his R foot with gait and denies balance issues stating "I will not fall at home". He also was unaware of location of his chair even with cues as he would initiate going towards chair and then attempt to sit prematurely. Shakiness noted throughout session. MinA to come to stand with increased effort. BP dropped with changes in position, see General Comments below. Will continue to follow acutely. Pt would greatly benefit from intensive therapy in the CIR setting to address his deficits in cognition, safety awareness, and functional  mobility.  Follow Up Recommendations  CIR;Supervision/Assistance - 24 hour (if pt denies CIR then SNF)     Equipment Recommendations  Rolling walker with 5" wheels;3in1 (PT)    Recommendations for Other Services Rehab consult     Precautions / Restrictions Precautions Precautions: Fall Precaution Comments: monitor BP (HTN and orthostatic hypotension) Restrictions Weight Bearing Restrictions: No    Mobility  Bed Mobility Overal bed mobility: Needs Assistance Bed Mobility: Supine to Sit     Supine to sit: Min guard;HOB elevated Sit to supine: Min guard   General bed mobility comments: Ues of bed rails and HOB elevated to come to sit EOB with extra time and effort, min guard for safety.  Transfers Overall transfer level: Needs assistance Equipment used: Rolling walker (2 wheeled) Transfers: Sit to/from [facility] Sit to Stand: Min assist Stand pivot transfers: Mod assist      Lateral/Scoot Transfers: Min guard General transfer comment: Sit to stand from EOB with cues for hand placement with minA and extra time and effort to power up. ModA for stability and to cue pt to sequence steps and manage RW to step to transfer to recliner, intermittent LOB with modA to recover.  Ambulation/Gait Ambulation/Gait assistance: Mod assist Gait Distance (Feet): 8 Feet Assistive device: Rolling walker (2 wheeled) Gait Pattern/deviations: Decreased step length - right;Decreased stride length;Decreased weight shift to left;Shuffle;Leaning posteriorly;Step-to pattern;Decreased weight shift to right Gait velocity: reduced Gait velocity interpretation: <1.31 ft/sec, indicative of household ambulator General Gait Details: Ambulates at slow pace with trunk sway and knee instability without buckling noted. Decreased bilat step length, esp on R with step-to gait, with evident shuffling,  despite cues to correct. Several bouts of LOB when taking steps requiring modA to recover,  otherwise minA for steadying when no LOB. Poor awareness of location of chair, despite cues, attemtping to sit prior to reaching chair.   Stairs             Wheelchair Mobility    Modified Rankin (Stroke Patients Only) Modified Rankin (Stroke Patients Only) Pre-Morbid Rankin Score: No symptoms Modified Rankin: Moderately severe disability     Balance Overall balance assessment: Needs assistance Sitting-balance support: Feet supported;No upper extremity supported Sitting balance-Leahy Scale: Fair Sitting balance - Comments: no UE support, min gaurd for safety sitting statically EOB. Postural control: Posterior lean Standing balance support: Bilateral upper extremity supported;During functional activity Standing balance-Leahy Scale: Poor Standing balance comment: Reliant on UE support and external support for balance, with LOB bouts and assistance to recover.                            Cognition Arousal/Alertness: Awake/alert Behavior During Therapy: Agitated;Impulsive Overall Cognitive Status: Impaired/Different from baseline Area of Impairment: Attention;Following commands;Safety/judgement;Memory;Problem solving;Awareness;Orientation                 Orientation Level: Disoriented to;Time (stated month was September) Current Attention Level: Sustained Memory: Decreased short-term memory Following Commands: Follows one step commands consistently;Follows one step commands with increased time;Follows multi-step commands inconsistently Safety/Judgement: Decreased awareness of safety;Decreased awareness of deficits Awareness: Emergent Problem Solving: Slow processing;Difficulty sequencing;Requires verbal cues General Comments: Came into room initially due to pt impulsively trying to get OOB, x2 reps, despite cues otherwise. Pt perseverating on calling nephew and going home and getting agitated with idea of staying in hospital longer. Poor word finding, having  to change statements or numbers often. STM deficits and poor safety awareness and awareness of deficits as pt required repeated cues and extra time to proccess the cues to respond correctly and even attempting to avoid recognizing his difficulty with ambulating and his decreased balance stating he would not fall at home yet he would lose balance during session.      Exercises      General Comments General comments (skin integrity, edema, etc.): BP sitting EOB 162/94; BP standing ~110s/60s?; BP sitting after gait and transfer 131/80; denies dizziness, lightheadedness, headache      Pertinent Vitals/Pain Pain Assessment: No/denies pain    Home Living Family/patient expects to be discharged to:: Private residence (apartment) Living Arrangements: Alone                  Prior Function            PT Goals (current goals can now be found in the care plan section) Acute Rehab PT Goals Patient Stated Goal: to go home PT Goal Formulation: With patient Time For Goal Achievement: 05/08/20 Potential to Achieve Goals: Good Progress towards PT goals: Progressing toward goals    Frequency    Min 4X/week      PT Plan Current plan remains appropriate    Co-evaluation              AM-PAC PT "6 Clicks" Mobility   Outcome Measure  Help needed turning from your back to your side while in a flat bed without using bedrails?: A Little Help needed moving from lying on your back to sitting on the side of a flat bed without using bedrails?: A Little Help needed moving to and from a bed to a chair (  including a wheelchair)?: A Lot Help needed standing up from a chair using your arms (e.g., wheelchair or bedside chair)?: A Little Help needed to walk in hospital room?: A Lot Help needed climbing 3-5 steps with a railing? : Total 6 Click Score: 14    End of Session Equipment Utilized During Treatment: Gait belt Activity Tolerance: Treatment limited secondary to medical complications  (Comment) (BP changes) Patient left: in chair;with call bell/phone within reach;with chair alarm set Nurse Communication: Mobility status PT Visit Diagnosis: Unsteadiness on feet (R26.81);Other abnormalities of gait and mobility (R26.89);Muscle weakness (generalized) (M62.81);Difficulty in walking, not elsewhere classified (R26.2);Other symptoms and signs involving the nervous system (R29.898)     Time: 1005-1031 PT Time Calculation (min) (ACUTE ONLY): 26 min  Charges:  $Therapeutic Activity: 23-37 mins                     Moishe Spice, PT, DPT Acute Rehabilitation Services  Pager: [phone] Office: [place] 04/25/2020, 10:49 AM

## 2020-04-25 NOTE — Progress Notes (Signed)
PROGRESS NOTE    Gregory Nunez.  IRS:[id number] DOB: Jul 24, 1945 DOA: 04/23/2020 PCP: [name], MD    Brief Narrative:  Gregory Nunez. is a 74 year old male with past medical history significant for type 2 diabetes mellitus with diabetic retinopathy, essential hypertension, hyperlipidemia, prostate cancer and alcohol use disorder who presented to the ED with right-sided weakness.  Patient was found wandering around a local [facility] and EMS was activated with notable right-sided facial droop and altered mental status.  Upon chart review, concern for excessive alcohol abuse from prior presentations.  Patient reports his last drink was roughly 12 days ago.  Does report chronic tobacco use of 1.5 packs/day.  In the ED, BP 190/94, HR 95, RR 15, SPO2 97% room air.  WBC 5.2, hemoglobin 11.6, platelets 196.  Sodium 137, potassium 3.5, CO2 19, BUN 38, creatinine 2.35, glucose 110.  AST 68, ALT 39, alk phos 45, total bilirubin 0.8.  SARS-CoV-2/influenza A/B PCR negative.  CT head without contrast with new hypodensity in left paramedian pons consistent with acute infarction and ill-defined hypodensity left lenticular nuclear suspicious for subacute infarct.  Neurology was consulted.  Patient received 325 mg aspirin and 1 L NS bolus in the ED.  Also received Librium and Ativan as needed per CIWA protocol for concern of EtOH withdrawal.  Hospitalist service consulted for further evaluation and management of acute CVA.   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  CT head without contrast notable for ischemic infarct left basal ganglia without hemorrhage/mass.  MR brain with 3 mm acute ischemic nonhemorrhagic infarct left basal ganglia and 5 mm subacute infarct right periatrial white matter.  MRA head/neck with no large vessel occlusion but with short segment moderate stenosis mid basilar artery, short segment moderate stenosis 3 frontal left V1 segment, patent carotid arteries.  Hemoglobin A1c 6.5.  Lipid panel with total cholesterol 141, HDL 38, LDL 82, and triglycerides 105.  TTE with LVEF 60-65%, G1DD, mild MR, no LV regional wall motion abnormalities, RV systolic function normal, IVC normal in size.  --Neurology following, appreciate assistance --PT recommends CIR, rolling walker with 5 inch wheels, 3 and 1 bedside commode --OT recommends CIR --SLP evaluation: Dysphagia 3 diet, mechanical soft with thin liquids, meds with pure --Aspirin 81 mg p.o. daily, Plavix 75 mg p.o. daily x 3 weeks  followed by aspirin alone --Started atorvastatin 40 mg p.o. daily --No arrhythmias noted on telemetry such far, offered loop recorder placement, patient declined today --Pending CIR versus SNF placement  Acute Renal Failure Etiology likely prerenal from poor oral intake.  Received IV fluid bolus on admission. --Cr 2.35>2.20>1.66 (baseline 1.05-1.1) --Avoid nephrotoxins, renally dose all medications --Repeat BMP in a.m.  Essential hypertension On amlodipine 10 mg at home.   --restart amlodipine at 63m PO daily --Continue to monitor BP closely and slowly adjust to normal BP over next few days  Type 2 diabetes mellitus Hemoglobin A1c 6.5.  On Metformin 500 mg  twice daily at home. --Sensitive insulin sliding scale for coverage --CBGs before every meal/at bedtime  Hyperlipidemia Lipid panel with total cholesterol 141, HDL 38, LDL 82, and triglycerides  105. --Atorvastatin 40 mg p.o. daily  Tobacco use disorder Reports smoking 1.5 packs/day.  Counseled on need for complete cessation --Nicotine patch  EtOH use disorder --CIWAA protocol with symptom triggered Ativan --Thiamine, folic acid, multivitamin  Cognitive impairment Patient seen by Occupational Therapy, scored 2/10 on the medicog; which correlates to cognitive limitations that are required for safe completion of medication management and cooking.  Patient seen by psychiatry on 04/25/2020 and deemed to possess capacity for medical decision-making.  Etiology of his impairment likely confounded by his significant EtOH abuse disorder.   DVT prophylaxis: Lovenox Code Status: Full code Family Communication: Updated patient extensively at bedside  Disposition Plan:  Status is: Inpatient  Remains inpatient appropriate because:Ongoing diagnostic testing needed not appropriate for outpatient work up, Unsafe d/c plan, IV treatments appropriate due to intensity of illness or inability to take PO and Inpatient level of care appropriate due to severity of illness   Dispo: The patient is from: Home              Anticipated d/c is to: SNF vs CIR              Anticipated d/c date is: 1 day              Patient currently is medically stable to d/c.   Consultants:   Neurology  Psychiatry  Procedures:   TTE  Antimicrobials:   None   Subjective: Patient seen and examined bedside, resting comfortably.  Patient requesting to discharge home today.  Was observed by nursing and therapy today to be unsteady in gait and concerned about his ability to make medical decisions.  Per OT he scored very poorly on his cognitive assessment.  Nephew wants him to go to rehab.  Was seen by psychiatry this morning with assessment that he possesses capacity to make medical decisions.  Now that patient has seen that he was so unsteady on his feet he would like now to go to rehab.  No other complaints or  concerns at this time. Denies headache, no fever/chills/night sweats, no nausea/vomiting/diarrhea, no chest pain, no palpitations, no shortness of breath, no abdominal pain.  No acute events overnight per nursing staff.  Objective: Vitals:   04/25/20 0057 04/25/20 0329 04/25/20 0800 04/25/20 1142  BP: (!) 173/106 (!) 157/73 (!) 167/89 (!) 153/95  Pulse: 95 99 (!) 107 (!) 107  Resp: _0 Temp:  97.6 F (36.4 C) 97.9 F (36.6 C) 98.1 F (36.7 C)  TempSrc:  Oral Oral Oral  SpO2: 100% 100% 100% 100%  Weight:      Height:        Intake/Output Summary (Last 24 hours) at 04/25/2020 1419 Last data filed at 04/25/2020 0617 Gross per 24 hour  Intake 240 ml  Output 1475 ml  Net -1235 ml   Filed Weights   04/23/20 1743  Weight: 84.4 kg    Examination:  General exam: Appears calm and comfortable  Respiratory system: Clear to auscultation. Respiratory effort normal.  Oxygenating well on room air Cardiovascular system: S1 & S2 heard, RRR. No JVD, murmurs, rubs, gallops or clicks. No pedal edema. Gastrointestinal system: Abdomen is nondistended, soft and nontender. No organomegaly or masses felt. Normal bowel sounds heard. Central nervous system: Alert and oriented to person/place/time.  Right-sided facial droop,  otherwise no focal neurological deficits. Extremities: Symmetric 5 x 5 power. Skin: No rashes, lesions or ulcers Psychiatry: Judgement and insight appear poor. Mood & affect appropriate.     Data Reviewed: I have personally reviewed following labs and imaging studies  CBC: Recent Labs  Lab 04/23/20 1715 04/23/20 1723  WBC 5.2  --   NEUTROABS 3.5  --   HGB 11.6* 11.9*  HCT 35.7* 35.0*  MCV 95.2  --   PLT 196  --    Basic Metabolic Panel: Recent Labs  Lab 04/23/20 1715 04/23/20 1723 04/25/20 0209  NA 137 141 140  K 3.5 3.6 3.2*  CL 104 105 108  CO2 19*  --  28  GLUCOSE 110* 108* 110*  BUN 38* 38* 27*  CREATININE 2.35* 2.20* 1.66*  CALCIUM 9.3  --  8.6*   MG  --   --  1.8   GFR: Estimated Creatinine Clearance: 42.8 mL/min (A) (by C-G formula based on SCr of 1.66 mg/dL (H)). Liver Function Tests: Recent Labs  Lab 04/23/20 1715  AST 68*  ALT 39  ALKPHOS 45  BILITOT 0.8  PROT 8.0  ALBUMIN 3.4*   No results for input(s): LIPASE, AMYLASE in the last 168 hours. No results for input(s): AMMONIA in the last 168 hours. Coagulation Profile: Recent Labs  Lab 04/23/20 1715  INR 1.0   Cardiac Enzymes: No results for input(s): CKTOTAL, CKMB, CKMBINDEX, TROPONINI in the last 168 hours. BNP (last 3 results) No results for input(s): PROBNP in the last 8760 hours. HbA1C: Recent Labs    04/24/20 0435  HGBA1C 6.5*   CBG: Recent Labs  Lab 04/24/20 1233 04/24/20 1614 04/24/20 2155 04/25/20 0646 04/25/20 1146  GLUCAP 175* 157* 135* 88 170*   Lipid Profile: Recent Labs    04/24/20 0435  CHOL 141  HDL 38*  LDLCALC 82  TRIG 105  CHOLHDL 3.7   Thyroid Function Tests: No results for input(s): TSH, T4TOTAL, FREET4, T3FREE, THYROIDAB in the last 72 hours. Anemia Panel: No results for input(s): VITAMINB12, FOLATE, FERRITIN, TIBC, IRON, RETICCTPCT in the last 72 hours. Sepsis Labs: No results for input(s): PROCALCITON, LATICACIDVEN in the last 168 hours.  Recent Results (from the past 240 hour(s))  Resp Panel by RT-PCR (Flu A&B, Covid) Nasopharyngeal Swab     Status: None   Collection Time: 04/23/20  7:25 PM   Specimen: Nasopharyngeal Swab; Nasopharyngeal(NP) swabs in vial transport medium  Result Value Ref Range Status   SARS Coronavirus 2 by RT PCR NEGATIVE NEGATIVE Final    Comment: (NOTE) SARS-CoV-2 target nucleic acids are NOT DETECTED.  The SARS-CoV-2 RNA is generally detectable in upper respiratory specimens during the acute phase of infection. The lowest concentration of SARS-CoV-2 viral copies this assay can detect is 138 copies/mL. A negative result does not preclude SARS-Cov-2 infection and should not be used as  the sole basis for treatment or other patient management decisions. A negative result may occur with  improper specimen collection/handling, submission of specimen other than nasopharyngeal swab, presence of viral mutation(s) within the areas targeted by this assay, and inadequate number of viral copies(<138 copies/mL). A negative result must be combined with clinical observations, patient history, and epidemiological information. The expected result is Negative.  Fact Sheet for Patients:  EntrepreneurPulse.com.au  Fact Sheet for Healthcare Providers:  IncredibleEmployment.be  This test is no t yet approved or cleared by the [place] FDA and  has been authorized for detection and/or diagnosis of SARS-CoV-2 by FDA under  an Emergency Use Authorization (EUA). This EUA will remain  in effect (meaning this test can be used) for the duration of the COVID-19 declaration under Section 564(b)(1) of the Act, 21 U.S.C.section 360bbb-3(b)(1), unless the authorization is terminated  or revoked sooner.       Influenza A by PCR NEGATIVE NEGATIVE Final   Influenza B by PCR NEGATIVE NEGATIVE Final    Comment: (NOTE) The Xpert Xpress SARS-CoV-2/FLU/RSV plus assay is intended as an aid in the diagnosis of influenza from Nasopharyngeal swab specimens and should not be used as a sole basis for treatment. Nasal washings and aspirates are unacceptable for Xpert Xpress SARS-CoV-2/FLU/RSV testing.  Fact Sheet for Patients: EntrepreneurPulse.com.au  Fact Sheet for Healthcare Providers: IncredibleEmployment.be  This test is not yet approved or cleared by the [place] FDA and has been authorized for detection and/or diagnosis of SARS-CoV-2 by FDA under an Emergency Use Authorization (EUA). This EUA will remain in effect (meaning this test can be used) for the duration of the COVID-19 declaration under Section 564(b)(1) of  the Act, 21 U.S.C. section 360bbb-3(b)(1), unless the authorization is terminated or revoked.  Performed at [facility], [place] [address], [place], [place] [address]          Radiology Studies: CT HEAD WO CONTRAST  Result Date: 04/24/2020 CLINICAL DATA:  Follow-up examination for acute stroke. EXAM: CT HEAD WITHOUT CONTRAST TECHNIQUE: Contiguous axial images were obtained from the base of the skull through the vertex without intravenous contrast. COMPARISON:  Prior MRI from 04/23/2020. FINDINGS: Brain: Previously identified approximate 3 cm acute ischemic infarct involving the left basal ganglia again seen, stable in size and distribution. No hemorrhagic transformation or other complication. No significant mass effect. Previously noted additional subcentimeter subacute infarct involving the right periatrial white matter not well visualized by CT. No other new acute intracranial process. No hemorrhage or new large vessel territory infarct. No mass lesion, midline shift or mass effect. No hydrocephalus or extra-axial fluid collection. Atrophy with chronic microvascular ischemic disease again noted. Remote pontine infarct again noted as well. Vascular: No visible hyperdense vessel. Calcified atherosclerosis at the skull base. Skull: Scalp soft tissues and calvarium within normal limits. Sinuses/Orbits: Globes and orbital soft tissues demonstrate no acute finding. Paranasal sinuses remain clear. No significant mastoid effusion. Other: None. IMPRESSION: 1. No significant interval change in size and distribution of ischemic infarct involving the left basal ganglia. No hemorrhagic transformation or other complication. 2. Previously identified subcentimeter subacute right periatrial infarct not well visualized by CT. 3. No other new acute intracranial abnormality. Electronically Signed   By: Jeannine Boga M.D.   On: 04/24/2020 04:47   MR ANGIO HEAD WO CONTRAST  Result Date: 04/23/2020 CLINICAL  DATA:  Initial evaluation for acute stroke. EXAM: MRI HEAD WITHOUT CONTRAST MRA HEAD WITHOUT CONTRAST MRA NECK WITHOUT AND WITH CONTRAST TECHNIQUE: Multiplanar, multiecho pulse sequences of the brain and surrounding structures were obtained without intravenous contrast. Angiographic images of the Circle of Willis were obtained using MRA technique without intravenous contrast. Angiographic images of the neck were obtained using MRA technique without and with intravenous contrast. Carotid stenosis measurements (when applicable) are obtained utilizing NASCET criteria, using the distal internal carotid diameter as the denominator. CONTRAST:  62m GADAVIST GADOBUTROL 1 MMOL/ML IV SOLN COMPARISON:  Prior head CT from earlier the same day. FINDINGS: MRI HEAD FINDINGS Brain: Examination degraded by motion artifact. Diffuse prominence of the CSF containing spaces compatible generalized age-related cerebral atrophy. Patchy and confluent T2/FLAIR hyperintensity  within the periventricular and deep white matter both cerebral hemispheres most consistent with chronic small vessel ischemic disease. Patchy involvement of the pons noted. Superimposed remote lacunar infarct present at the left paramedian pons. Additional small remote lacunar infarct present at the posterior right frontoparietal corona radiata (series 15, image 17). Approximate 3 cm focus of curvilinear restricted diffusion seen involving the left basal ganglia, extending from the left lentiform nucleus/subinsular white matter to the left caudate, consistent with acute ischemic infarct. Finding corresponds with abnormality on prior CT. Probable associated trace petechial hemorrhage (series 11, image 30). No frank hemorrhagic transformation or significant regional mass effect. An additional 5 mm focus of mild diffusion abnormality noted within the right periatrial white matter, consistent with a small subacute small vessel type infarct (series 5, image 71). No associated  hemorrhage or mass effect. No other diffusion abnormality to suggest acute or subacute ischemia. Gray-white matter differentiation otherwise maintained. No acute intracranial hemorrhage. Subcentimeter chronic microhemorrhage noted at the posterior left temporal occipital region, of doubtful significance isolation (series 11, image 29). No mass lesion, midline shift or mass effect. No hydrocephalus or extra-axial fluid collection. Pituitary gland suprasellar region normal. Midline structures intact. Vascular: Major intracranial vascular flow voids are maintained. Skull and upper cervical spine: Craniocervical junction normal. Bone marrow signal intensity within normal limits. No scalp soft tissue abnormality. Sinuses/Orbits: Patient status post bilateral ocular lens replacement. Globes and orbital soft tissues demonstrate no acute finding. Paranasal sinuses are largely clear. No significant mastoid effusion. Inner ear structures grossly normal. Other: None. MRA HEAD FINDINGS ANTERIOR CIRCULATION: Emanation examination degraded by motion artifact. Visualized distal cervical segments of the internal carotid arteries are somewhat tortuous but patent with antegrade flow. Petrous, cavernous, and supraclinoid segments widely patent without appreciable stenosis. A1 segments patent bilaterally. Normal anterior communicating artery complex. Anterior cerebral arteries patent to their distal aspects without appreciable stenosis. No M1 stenosis or occlusion. Normal left MCA bifurcation. Right MCA trifurcations. Distal MCA branches well perfused and grossly symmetric. POSTERIOR CIRCULATION: Left V4 segment dominant. Mild atheromatous narrowing of the distal left V4 segment just prior to the vertebrobasilar junction. Left PICA origin patent and grossly normal. Right V4 segment patent without stenosis. Right PICA origin not seen. 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 Basilar  otherwise widely patent. Superior cerebellar arteries grossly patent bilaterally. Both PCAs primarily supplied via the basilar, although a small right posterior communicating artery noted. PCAs well perfused to their distal aspects without appreciable stenosis. No aneurysm. MRA NECK FINDINGS AORTIC ARCH: Visualized aortic arch of normal caliber with normal 3 vessel morphology. No hemodynamically significant stenosis seen about the origin of the great vessels. Visualized subclavian arteries widely patent. RIGHT CAROTID SYSTEM: Right common and internal carotid arteries widely patent without stenosis, evidence for dissection, or occlusion. No significant atheromatous irregularity or narrowing about the right bifurcation. LEFT CAROTID SYSTEM: Left common and internal carotid arteries widely patent without stenosis, evidence for dissection or occlusion. No significant atheromatous narrowing or irregularity about the left carotid bifurcation. VERTEBRAL ARTERIES: Both vertebral arteries arise from the subclavian arteries. No proximal subclavian artery stenosis. Left vertebral artery dominant. Single short-segment moderate stenosis at the pre foraminal left V1 segment (series 1021, image 13). Vertebral arteries otherwise widely patent within the neck without stenosis, evidence for dissection or occlusion. IMPRESSION: MRI HEAD IMPRESSION: 1. 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 2. Additional 5 mm subacute ischemic nonhemorrhagic  right periatrial white matter infarct. 3. 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 MRA HEAD IMPRESSION: 1. Motion degraded exam. 2. Negative intracranial MRA for large vessel occlusion. 3. 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 No other hemodynamically significant or correctable stenosis identified. MRA NECK IMPRESSION: 1. Wide patency of both carotid artery systems within the neck. 2. Single short-segment moderate stenosis involving the pre foraminal left V1 segment. Vertebral arteries otherwise widely patent within the neck. Left vertebral artery dominant. Electronically Signed   By: Jeannine Boga M.D.   On: 04/23/2020 21:43   MR ANGIO NECK W WO CONTRAST  Result Date: 04/23/2020 CLINICAL DATA:  Initial evaluation for acute stroke. EXAM: MRI HEAD WITHOUT CONTRAST MRA HEAD WITHOUT CONTRAST MRA NECK WITHOUT AND WITH CONTRAST TECHNIQUE: Multiplanar, multiecho pulse sequences of the brain and surrounding structures were obtained without intravenous contrast. Angiographic images of the Circle of Willis were obtained using MRA technique without intravenous contrast. Angiographic images of the neck were obtained using MRA technique without and with intravenous contrast. Carotid stenosis measurements (when applicable) are obtained utilizing NASCET criteria, using the distal internal carotid diameter as the denominator. CONTRAST:  42m GADAVIST GADOBUTROL 1 MMOL/ML IV SOLN COMPARISON:  Prior head CT from earlier the same day. FINDINGS: MRI HEAD FINDINGS Brain: Examination degraded by motion artifact. Diffuse prominence of the CSF containing spaces compatible generalized age-related cerebral atrophy. Patchy and confluent T2/FLAIR hyperintensity within the periventricular and deep white matter both cerebral hemispheres most consistent with chronic small vessel ischemic disease. Patchy involvement of the pons noted. Superimposed remote lacunar infarct present at the left paramedian pons. Additional small remote lacunar infarct present at the posterior right frontoparietal corona radiata (series 15, image 17). Approximate 3 cm focus of curvilinear restricted diffusion seen involving the left basal ganglia, extending from the  left lentiform nucleus/subinsular white matter to the left caudate, consistent with acute ischemic infarct. Finding corresponds with abnormality on prior CT. Probable associated trace petechial hemorrhage (series 11, image 30). No frank hemorrhagic transformation or significant regional mass effect. An additional 5 mm focus of mild diffusion abnormality noted within the right periatrial white matter, consistent with a small subacute small vessel type infarct (series 5, image 71). No associated hemorrhage or mass effect. No other diffusion abnormality to suggest acute or subacute ischemia. Gray-white matter differentiation otherwise maintained. No acute intracranial hemorrhage. Subcentimeter chronic microhemorrhage noted at the posterior left temporal occipital region, of doubtful significance isolation (series 11, image 29). No mass lesion, midline shift or mass effect. No hydrocephalus or extra-axial fluid collection. Pituitary gland suprasellar region normal. Midline structures intact. Vascular: Major intracranial vascular flow voids are maintained. Skull and upper cervical spine: Craniocervical junction normal. Bone marrow signal intensity within normal limits. No scalp soft tissue abnormality. Sinuses/Orbits: Patient status post bilateral ocular lens replacement. Globes and orbital soft tissues demonstrate no acute finding. Paranasal sinuses are largely clear. No significant mastoid effusion. Inner ear structures grossly normal. Other: None. MRA HEAD FINDINGS ANTERIOR CIRCULATION: Emanation examination degraded by motion artifact. Visualized distal cervical segments of the internal carotid arteries are somewhat tortuous but patent with antegrade flow. Petrous, cavernous, and supraclinoid segments widely patent without appreciable stenosis. A1 segments patent bilaterally. Normal anterior communicating artery complex. Anterior cerebral arteries patent to their distal aspects without appreciable stenosis. No M1  stenosis or occlusion. Normal left MCA bifurcation. Right MCA trifurcations. Distal MCA  branches well perfused and grossly symmetric. POSTERIOR CIRCULATION: Left V4 segment dominant. Mild atheromatous narrowing of the distal left V4 segment just prior to the vertebrobasilar junction. Left PICA origin patent and grossly normal. Right V4 segment patent without stenosis. Right PICA origin not seen. 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 Basilar otherwise widely patent. Superior cerebellar arteries grossly patent bilaterally. Both PCAs primarily supplied via the basilar, although a small right posterior communicating artery noted. PCAs well perfused to their distal aspects without appreciable stenosis. No aneurysm. MRA NECK FINDINGS AORTIC ARCH: Visualized aortic arch of normal caliber with normal 3 vessel morphology. No hemodynamically significant stenosis seen about the origin of the great vessels. Visualized subclavian arteries widely patent. RIGHT CAROTID SYSTEM: Right common and internal carotid arteries widely patent without stenosis, evidence for dissection, or occlusion. No significant atheromatous irregularity or narrowing about the right bifurcation. LEFT CAROTID SYSTEM: Left common and internal carotid arteries widely patent without stenosis, evidence for dissection or occlusion. No significant atheromatous narrowing or irregularity about the left carotid bifurcation. VERTEBRAL ARTERIES: Both vertebral arteries arise from the subclavian arteries. No proximal subclavian artery stenosis. Left vertebral artery dominant. Single short-segment moderate stenosis at the pre foraminal left V1 segment (series 1021, image 13). Vertebral arteries otherwise widely patent within the neck without stenosis, evidence for dissection or occlusion. IMPRESSION: MRI HEAD IMPRESSION: 1. 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 2. Additional 5 mm subacute ischemic nonhemorrhagic right periatrial white matter infarct. 3. 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 MRA HEAD IMPRESSION: 1. Motion degraded exam. 2. Negative intracranial MRA for large vessel occlusion. 3. 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 No other hemodynamically significant or correctable stenosis identified. MRA NECK IMPRESSION: 1. Wide patency of both carotid artery systems within the neck. 2. Single short-segment moderate stenosis involving the pre foraminal left V1 segment. Vertebral arteries otherwise widely patent within the neck. Left vertebral artery dominant. Electronically Signed   By: Jeannine Boga M.D.   On: 04/23/2020 21:43   MR BRAIN WO CONTRAST  Result Date: 04/23/2020 CLINICAL DATA:  Initial evaluation for acute stroke. EXAM: MRI HEAD WITHOUT CONTRAST MRA HEAD WITHOUT CONTRAST MRA NECK WITHOUT AND WITH CONTRAST TECHNIQUE: Multiplanar, multiecho pulse sequences of the brain and surrounding structures were obtained without intravenous contrast. Angiographic images of the Circle of Willis were obtained using MRA technique without intravenous contrast. Angiographic images of the neck were obtained using MRA technique without and with intravenous contrast. Carotid stenosis measurements (when applicable) are obtained utilizing NASCET criteria, using the distal internal carotid diameter as the denominator. CONTRAST:  34m GADAVIST GADOBUTROL 1 MMOL/ML IV SOLN COMPARISON:  Prior head CT from earlier the same day. FINDINGS: MRI HEAD FINDINGS Brain: Examination degraded by motion artifact. Diffuse prominence of the CSF containing spaces compatible generalized age-related cerebral atrophy. Patchy and confluent T2/FLAIR  hyperintensity within the periventricular and deep white matter both cerebral hemispheres most consistent with chronic small vessel ischemic disease. Patchy involvement of the pons noted. Superimposed remote lacunar infarct present at the left paramedian pons. Additional small remote lacunar infarct present at the posterior right frontoparietal corona radiata (series 15, image 17). Approximate 3 cm focus of curvilinear restricted diffusion seen involving the left basal ganglia, extending from the left lentiform nucleus/subinsular white matter to the  left caudate, consistent with acute ischemic infarct. Finding corresponds with abnormality on prior CT. Probable associated trace petechial hemorrhage (series 11, image 30). No frank hemorrhagic transformation or significant regional mass effect. An additional 5 mm focus of mild diffusion abnormality noted within the right periatrial white matter, consistent with a small subacute small vessel type infarct (series 5, image 71). No associated hemorrhage or mass effect. No other diffusion abnormality to suggest acute or subacute ischemia. Gray-white matter differentiation otherwise maintained. No acute intracranial hemorrhage. Subcentimeter chronic microhemorrhage noted at the posterior left temporal occipital region, of doubtful significance isolation (series 11, image 29). No mass lesion, midline shift or mass effect. No hydrocephalus or extra-axial fluid collection. Pituitary gland suprasellar region normal. Midline structures intact. Vascular: Major intracranial vascular flow voids are maintained. Skull and upper cervical spine: Craniocervical junction normal. Bone marrow signal intensity within normal limits. No scalp soft tissue abnormality. Sinuses/Orbits: Patient status post bilateral ocular lens replacement. Globes and orbital soft tissues demonstrate no acute finding. Paranasal sinuses are largely clear. No significant mastoid effusion. Inner ear structures  grossly normal. Other: None. MRA HEAD FINDINGS ANTERIOR CIRCULATION: [place] examination degraded by motion artifact. Visualized distal cervical segments of the internal carotid arteries are somewhat tortuous but patent with antegrade flow. Petrous, cavernous, and supraclinoid segments widely patent without appreciable stenosis. A1 segments patent bilaterally. Normal anterior communicating artery complex. Anterior cerebral arteries patent to their distal aspects without appreciable stenosis. No M1 stenosis or occlusion. Normal left MCA bifurcation. Right MCA trifurcations. Distal MCA branches well perfused and grossly symmetric. POSTERIOR CIRCULATION: Left V4 segment dominant. Mild atheromatous narrowing of the distal left V4 segment just prior to the vertebrobasilar junction. Left PICA origin patent and grossly normal. Right V4 segment patent without stenosis. Right PICA origin not seen. 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 Basilar otherwise widely patent. Superior cerebellar arteries grossly patent bilaterally. Both PCAs primarily supplied via the basilar, although a small right posterior communicating artery noted. PCAs well perfused to their distal aspects without appreciable stenosis. No aneurysm. MRA NECK FINDINGS AORTIC ARCH: Visualized aortic arch of normal caliber with normal 3 vessel morphology. No hemodynamically significant stenosis seen about the origin of the great vessels. Visualized subclavian arteries widely patent. RIGHT CAROTID SYSTEM: Right common and internal carotid arteries widely patent without stenosis, evidence for dissection, or occlusion. No significant atheromatous irregularity or narrowing about the right bifurcation. LEFT CAROTID SYSTEM: Left common and internal carotid arteries widely patent without stenosis, evidence for dissection or occlusion. No significant atheromatous narrowing or irregularity about the left carotid  bifurcation. VERTEBRAL ARTERIES: Both vertebral arteries arise from the subclavian arteries. No proximal subclavian artery stenosis. Left vertebral artery dominant. Single short-segment moderate stenosis at the pre foraminal left V1 segment (series 1021, image 13). Vertebral arteries otherwise widely patent within the neck without stenosis, evidence for dissection or occlusion. IMPRESSION: MRI HEAD IMPRESSION: 1. 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 2. Additional 5 mm subacute ischemic nonhemorrhagic right periatrial white matter infarct. 3. 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 MRA HEAD IMPRESSION: 1. Motion degraded exam. 2. Negative intracranial MRA for large vessel occlusion. 3. 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 No other hemodynamically significant or correctable stenosis identified. MRA NECK IMPRESSION: 1. Wide patency of both carotid artery systems  within the neck. 2. Single short-segment moderate stenosis involving the pre foraminal left V1 segment. Vertebral arteries otherwise widely patent within the neck. Left vertebral artery dominant. Electronically Signed   By: Jeannine Boga M.D.   On: 04/23/2020 21:43   ECHOCARDIOGRAM COMPLETE  Result Date: 04/24/2020    ECHOCARDIOGRAM REPORT   Patient Name:   Gregory Barnier. Date of Exam: 04/24/2020 Medical Rec #:  [id number]               Height:       71.0 in Accession #:    [id number]              Weight:       186.1 lb Date of Birth:  06-24-1945               BSA:          2.045 m Patient Age:    89 years                BP:           166/95 mmHg Patient Gender: M                       HR:           87 bpm. Exam Location:  Inpatient Procedure: 2D Echo,  Cardiac Doppler and Color Doppler Indications:    Stroke 434.91 / I163.9  History:        Patient has no prior history of Echocardiogram examinations.                 Signs/Symptoms:Dyspnea; Risk Factors:Hypertension, Diabetes and                 Dyslipidemia. Cancer. GERD.  Sonographer:    Jonelle Sidle Dance Referring Phys: [id number] [place]  1. Left ventricular ejection fraction, by estimation, is 60 to 65%. The left ventricle has normal function. The left ventricle has no regional wall motion abnormalities. There is mild concentric left ventricular hypertrophy. Left ventricular diastolic parameters are consistent with Grade I diastolic dysfunction (impaired relaxation).  2. Right ventricular systolic function is normal. The right ventricular size is normal.  3. The mitral valve is normal in structure. Mild mitral valve regurgitation. No evidence of mitral stenosis.  4. The aortic valve is normal in structure. Aortic valve regurgitation is not visualized. No aortic stenosis is present.  5. The inferior vena cava is normal in size with greater than 50% respiratory variability, suggesting right atrial pressure of 3 mmHg. Conclusion(s)/Recommendation(s): No intracardiac source of embolism detected on this transthoracic study. A transesophageal echocardiogram is recommended to exclude cardiac source of embolism if clinically indicated. FINDINGS  Left Ventricle: Left ventricular ejection fraction, by estimation, is 60 to 65%. The left ventricle has normal function. The left ventricle has no regional wall motion abnormalities. The left ventricular internal cavity size was normal in size. There is  mild concentric left ventricular hypertrophy. Left ventricular diastolic parameters are consistent with Grade I diastolic dysfunction (impaired relaxation). Normal left ventricular filling pressure. Right Ventricle: The right ventricular size is normal. No increase in right ventricular wall thickness. Right  ventricular systolic function is normal. Left Atrium: Left atrial size was normal in size. Right Atrium: Right atrial size was normal in size. Pericardium: There is no evidence of pericardial effusion. Mitral Valve: The mitral valve is normal in structure. Mild mitral valve regurgitation. No evidence of mitral valve stenosis. Tricuspid  Valve: The tricuspid valve is normal in structure. Tricuspid valve regurgitation is mild . No evidence of tricuspid stenosis. Aortic Valve: The aortic valve is normal in structure. Aortic valve regurgitation is not visualized. No aortic stenosis is present. Pulmonic Valve: The pulmonic valve was normal in structure. Pulmonic valve regurgitation is not visualized. No evidence of pulmonic stenosis. Aorta: The aortic root is normal in size and structure. Venous: The inferior vena cava is normal in size with greater than 50% respiratory variability, suggesting right atrial pressure of 3 mmHg. IAS/Shunts: No atrial level shunt detected by color flow Doppler.  LEFT VENTRICLE PLAX 2D LVIDd:         4.20 cm  Diastology LVIDs:         3.30 cm  LV e' medial:    6.64 cm/s LV PW:         1.10 cm  LV E/e' medial:  7.4 LV IVS:        1.10 cm  LV e' lateral:   6.96 cm/s LVOT diam:     2.20 cm  LV E/e' lateral: 7.0 LV SV:         63 LV SV Index:   31 LVOT Area:     3.80 cm  RIGHT VENTRICLE            IVC RV Basal diam:  3.00 cm    IVC diam: 1.90 cm RV S prime:     8.49 cm/s TAPSE (M-mode): 1.7 cm LEFT ATRIUM             Index       RIGHT ATRIUM           Index LA diam:        3.20 cm 1.56 cm/m  RA Area:     12.30 cm LA Vol (A2C):   67.1 ml 32.81 ml/m RA Volume:   28.30 ml  13.84 ml/m LA Vol (A4C):   53.9 ml 26.36 ml/m LA Biplane Vol: 62.0 ml 30.32 ml/m  AORTIC VALVE LVOT Vmax:   86.20 cm/s LVOT Vmean:  55.300 cm/s LVOT VTI:    0.166 m  AORTA Ao Root diam: 3.40 cm Ao Asc diam:  3.30 cm MITRAL VALVE MV Area (PHT): 2.10 cm    SHUNTS MV Decel Time: 361 msec    Systemic VTI:  0.17 m MV E velocity:  49.05 cm/s  Systemic Diam: 2.20 cm MV A velocity: 63.40 cm/s MV E/A ratio:  0.77 Ena Dawley MD Electronically signed by Ena Dawley MD Signature Date/Time: 04/24/2020/11:59:21 AM    Final    CT HEAD CODE STROKE WO CONTRAST  Result Date: 04/23/2020 CLINICAL DATA:  Code stroke. Acute neuro deficit. Right facial droop and confusion EXAM: CT HEAD WITHOUT CONTRAST TECHNIQUE: Contiguous axial images were obtained from the base of the skull through the vertex without intravenous contrast. COMPARISON:  CT head 11/07/2018 FINDINGS: Brain: New area of ill-defined hypodensity in the left lenticular nucleus likely a subacute infarct. New hypodensity in the left paramedian pons which is relatively well defined and likely is a chronic lacunar infarct but could be subacute. Generalized atrophy. Chronic microvascular ischemic changes in the white matter are relatively mild. Negative for hemorrhage or mass. Ventricle size normal. Vascular: Negative for hyperdense vessel Skull: Negative Sinuses/Orbits: Paranasal sinuses clear. Bilateral cataract extraction. Other: None ASPECTS ([place] Stroke Program Early CT Score) - Ganglionic level infarction (caudate, lentiform nuclei, internal capsule, insula, M1-M3 cortex): 6 - Supraganglionic infarction (M4-M6 cortex): 3 Total score (0-10  with 10 being normal): 9 IMPRESSION: 1. New ill-defined hypodensity left lenticular nucleus suspicious for subacute infarct. 2. New hypodensity in the left paramedian pons consistent with acute infarction which is likely chronic but could be recent. 3. Negative for hemorrhage 4. ASPECTS is 9 5. Code stroke imaging results were communicated on 04/23/2020 at 5:36 pm to provider Lorrin Goodell via text page Electronically Signed   By: Franchot Gallo M.D.   On: 04/23/2020 17:39        Scheduled Meds: . aspirin EC  81 mg Oral Daily  . atorvastatin  40 mg Oral Daily  . clopidogrel  75 mg Oral Daily  . enoxaparin (LOVENOX) injection  40 mg  Subcutaneous Q24H  . insulin aspart  0-9 Units Subcutaneous TID WC  . LORazepam  0-4 mg Intravenous Q6H   Or  . LORazepam  0-4 mg Oral Q6H  . nicotine  21 mg Transdermal Daily  . thiamine  100 mg Oral Daily   Or  . thiamine  100 mg Intravenous Daily   Continuous Infusions:   LOS: 1 day    Time spent: 35 minutes spent on chart review, discussion with nursing staff, consultants, updating family and interview/physical exam; more than 50% of that time was spent in counseling and/or coordination of care.    Cherith Tewell J [place] DO [facility] Available via Epic secure chat 7am-7pm After these hours, please refer to coverage provider listed on [email or web address] 04/25/2020, 2:19 PM

## 2020-04-25 NOTE — NC FL2 (Signed)
[place] LEVEL OF CARE SCREENING TOOL     IDENTIFICATION  Patient Name: Gregory Nunez. Birthdate: 13-May-1945 Sex: male Admission Date (Current Location): 04/23/2020  [facility] and [place] Number:  Herbalist and Address:  [facility] [place] [address], [place], [place] [address]      Provider Number: [id number]  Attending Physician Name and Address:  [place] Donnamarie Poag, DO  Relative Name and Phone Number:       Current Level of Care: Hospital Recommended Level of Care: [place] Prior Approval Number:    Date Approved/Denied:   PASRR Number: [id number]  Discharge Plan: SNF    Current Diagnoses: Patient Active Problem List   Diagnosis Date Noted  . Acute CVA (cerebrovascular accident) ([place]) 04/23/2020  . Hyperlipidemia associated with type 2 diabetes mellitus ([place]) 04/23/2020  . Alcohol use 04/23/2020  . AKI (acute kidney injury) ([place]) 04/23/2020  . Tobacco use 04/23/2020  . Unresponsive 11/08/2018  . Syncope, convulsive 11/08/2018  . Hypertension associated with diabetes ([place]) 11/08/2018  . Alcohol withdrawal seizure with complication, with unspecified complication ([place]) 67/20/9470  . Type 2 diabetes mellitus without complication ([place]) 96/28/3662  . Symptomatic anemia 04/15/2018  . Prostate cancer ([place]) 02/27/2014  . Malignant neoplasm of prostate ([place]) 01/09/2014  . Bunion 01/17/2013  . 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    Orientation RESPIRATION BLADDER Height & Weight     Self  Normal Continent Weight: 84.4 kg Height:  5\' 11"  (180.3 cm)  BEHAVIORAL SYMPTOMS/MOOD NEUROLOGICAL BOWEL NUTRITION STATUS      Continent Diet (heart healthy/ carb modified with thin liquids)  AMBULATORY STATUS COMMUNICATION OF NEEDS Skin   Limited Assist Verbally Normal                       Personal Care Assistance Level of Assistance  Bathing,Feeding,Dressing Bathing Assistance: Limited  assistance Feeding assistance: Independent Dressing Assistance: Limited assistance     Functional Limitations Info  Sight,Hearing,Speech Sight Info: Adequate Hearing Info: Adequate Speech Info: Impaired (dysarthria)    SPECIAL CARE FACTORS FREQUENCY  PT (By licensed PT),OT (By licensed OT),Speech therapy     PT Frequency: 5x/wk OT Frequency: 5x/wk     Speech Therapy Frequency: 5x/wk      Contractures Contractures Info: Not present    Additional Factors Info  Code Status,Allergies,Insulin Sliding Scale Code Status Info: Full Allergies Info: Penicillins   Insulin Sliding Scale Info: Novolog 0-9 units SQ three times a day       Current Medications (04/25/2020):  This is the current hospital active medication list Current Facility-Administered Medications  Medication Dose Route Frequency Provider Last Rate Last Admin  . acetaminophen (TYLENOL) tablet 650 mg  650 mg Oral Q4H PRN Lenore Cordia, MD       Or  . acetaminophen (TYLENOL) 160 MG/5ML solution 650 mg  650 mg Per Tube Q4H PRN Lenore Cordia, MD       Or  . acetaminophen (TYLENOL) suppository 650 mg  650 mg Rectal Q4H PRN Lenore Cordia, MD      . aspirin EC tablet 81 mg  81 mg Oral Daily Kirby-Graham, Karsten Fells, NP   81 mg at 04/25/20 0820  . atorvastatin (LIPITOR) tablet 40 mg  40 mg Oral Daily Lenore Cordia, MD   40 mg at 04/25/20 0820  . clopidogrel (PLAVIX) tablet 75 mg  75 mg Oral Daily Kirby-Graham, Karsten Fells, NP  75 mg at 04/25/20 0820  . enoxaparin (LOVENOX) injection 40 mg  40 mg Subcutaneous Q24H Pham, Minh Q, RPH-CPP   40 mg at 04/24/20 2144  . insulin aspart (novoLOG) injection 0-9 Units  0-9 Units Subcutaneous TID WC Lenore Cordia, MD   2 Units at 04/24/20 1636  . LORazepam (ATIVAN) injection 0-4 mg  0-4 mg Intravenous Q6H Deno Etienne, DO       Or  . LORazepam (ATIVAN) tablet 0-4 mg  0-4 mg Oral Q6H Floyd, Dan, DO      . nicotine (NICODERM CQ - dosed in mg/24 hours) patch 21 mg  21 mg Transdermal  Daily Lenore Cordia, MD   21 mg at 04/25/20 9741  . potassium chloride SA (KLOR-CON) CR tablet 40 mEq  40 mEq Oral Q3H [place] Eric J, DO   40 mEq at 04/25/20 6384  . senna-docusate (Senokot-S) tablet 1 tablet  1 tablet Oral QHS PRN Zada Finders R, MD      . thiamine tablet 100 mg  100 mg Oral Daily Deno Etienne, DO   100 mg at 04/25/20 0820   Or  . thiamine (B-1) injection 100 mg  100 mg Intravenous Daily Deno Etienne, DO         Discharge Medications: Please see discharge summary for a list of discharge medications.  Relevant Imaging Results:  Relevant Lab Results:   Additional Information SS#: [id number]  Pollie Friar, RN

## 2020-04-25 NOTE — Progress Notes (Signed)
Gregory Nunez ( brother ) came to pick up a set of keys from patient that was "ok" to do so per [employer].

## 2020-04-25 NOTE — Discharge Summary (Addendum)
Physician Discharge Summary  [name] QAS:[id number] DOB: April 16, 1946 DOA: 04/23/2020  PCP: [name], MD  Admit date: 04/23/2020 Discharge date: 04/25/2020  Admitted From: Home Disposition: Home  Recommendations for Outpatient Follow-up:  Follow up with PCP in 1-2 weeks Follow-up with [facility]/stroke clinic in 6 weeks Continue DAPT with aspirin/Plavix followed by aspirin alone Started on atorvastatin 40 mg p.o. daily Continue encourage tobacco cessation Please obtain BMP in one week  Home Health: PT/OT/RN, patient refused SNF/CIR Equipment/Devices: Walker/3 1 bedside commode  Discharge Condition: Stable CODE STATUS: Full code Diet recommendation: Heart healthy/consistent carbohydrate diet  History of present illness:  Gregory Nunez. is a 75 year old male with past medical history significant for type 2 diabetes mellitus with diabetic retinopathy, essential hypertension, hyperlipidemia, prostate cancer and alcohol use disorder who presented to the ED with right-sided weakness.  Patient was found wandering around a local [facility] and EMS was activated with notable right-sided facial droop and altered mental status.   Upon chart review, concern for excessive alcohol abuse from prior presentations.  Patient reports his last drink was roughly 12 days ago.  Does report chronic tobacco use of 1.5 packs/day.   In the ED, BP 190/94, HR 95, RR 15, SPO2 97% room air.  WBC 5.2, hemoglobin 11.6, platelets 196.  Sodium 137, potassium 3.5, CO2 19, BUN 38, creatinine 2.35, glucose 110.  AST 68, ALT 39, alk phos 45, total bilirubin 0.8.  SARS-CoV-2/influenza A/B PCR negative.  CT head without contrast with new hypodensity in left paramedian pons consistent with acute infarction and ill-defined hypodensity left lenticular nuclear suspicious for subacute infarct.  Neurology was consulted.  Patient received 325 mg aspirin and 1 L NS bolus in the ED.  Also received  Librium and Ativan as needed per CIWA protocol for concern of EtOH withdrawal.  Hospitalist service consulted for further evaluation and management of acute CVA.  Hospital course:.  Acute CVA Patient presenting to the ED via EMS after being found wandering, confused with right-sided weakness and [facility]  CT head without contrast notable for ischemic infarct left basal ganglia without hemorrhage/mass.  MR brain with 3 mm acute ischemic nonhemorrhagic infarct left basal ganglia and 5 mm subacute infarct right periatrial white matter.  MRA head/neck with no large vessel occlusion but with short segment moderate stenosis mid basilar artery, short segment moderate stenosis 3 frontal left V1 segment, patent carotid arteries.  Hemoglobin A1c 6.5.  Lipid panel with total cholesterol 141, HDL 38, LDL 82, and triglycerides 105.  TTE with LVEF 60-65%, G1DD, mild MR, no LV regional wall motion abnormalities, RV systolic function normal, IVC normal in size.   Neurology was consulted and followed during hospital course.  We will continue dual antiplatelet therapy with aspirin and Plavix x3 weeks followed by aspirin alone.  Started on atorvastatin 40 mg p.o. daily.  Was seen by PT/OT with recommendations of the inpatient rehabilitation, although patient refused placement and wishes to return home.  Neurology also recommended loop recorder for concern of a cryptogenic source of stroke; patient declined loop recorder placement.  Recommend follow-up with PCP in 1 week following discharge.  Follow-up with [facility] in 6 weeks following discharge.   Acute Renal Failure Etiology likely prerenal from poor oral intake.  Received IV fluid bolus on admission.  Creatinine improved from 2.35 to 1.66 at time of discharge.  Recommend repeat BMP in next PCP visit.   Essential hypertension Continue home amlodipine 10 mg  p.o. daily, resumed home losartan-HCTZ.  Outpatient follow-up with PCP.   Type  2 diabetes mellitus Hemoglobin A1c 6.5.  On Metformin 500 mg twice daily at home.   Hyperlipidemia Lipid panel with total cholesterol 141, HDL 38, LDL 82, and triglycerides 105.  Started on atorvastatin 40 mg p.o. daily  Hypokalemia Potassium 3.2, repleted.  Recommend repeat BMP at next PCP visit.   Tobacco use disorder Reports smoking 1.5 packs/day.  Counseled on need for complete cessation   EtOH use disorder No signs of alcohol withdrawal during the hospitalization.  Continue encourage alcohol cessation.  Discharge Diagnoses:  Principal Problem:   Acute CVA (cerebrovascular accident) ([place]) Active Problems:   Hypertension associated with diabetes ([place])   Type 2 diabetes mellitus without complication ([place])   Hyperlipidemia associated with type 2 diabetes mellitus ([place])   Alcohol use   AKI (acute kidney injury) ([place])   Tobacco use    Discharge Instructions  Discharge Instructions     Call MD for:  difficulty breathing, headache or visual disturbances   Complete by: As directed    Call MD for:  extreme fatigue   Complete by: As directed    Call MD for:  persistant dizziness or light-headedness   Complete by: As directed    Call MD for:  persistant nausea and vomiting   Complete by: As directed    Call MD for:  severe uncontrolled pain   Complete by: As directed    Call MD for:  temperature >100.4   Complete by: As directed    Diet - low sodium heart healthy   Complete by: As directed    Increase activity slowly   Complete by: As directed       Allergies as of 04/25/2020       Reactions   Penicillins    Nervous   Penicillins Anxiety, Other (See Comments)   DID THE REACTION INVOLVE: Swelling of the face/tongue/throat, SOB, or low BP? No Sudden or severe rash/hives, skin peeling, or the inside of the mouth or nose? No Did it require medical treatment? No When did it last happen?      many years ago If all above answers are "NO", may proceed with cephalosporin  use.        Medication List     STOP taking these medications    HYDROcodone-acetaminophen 5-325 MG tablet Commonly known as: NORCO/VICODIN   losartan 100 MG tablet Commonly known as: COZAAR   methocarbamol 500 MG tablet Commonly known as: ROBAXIN   predniSONE 20 MG tablet Commonly known as: DELTASONE   traMADol 50 MG tablet Commonly known as: ULTRAM       TAKE these medications    albuterol 108 (90 Base) MCG/ACT inhaler Commonly known as: VENTOLIN HFA Inhale 2 puffs into the lungs every 6 (six) hours as needed for wheezing or shortness of breath.   amLODipine 10 MG tablet Commonly known as: NORVASC Take 10 mg by mouth daily.   aspirin 81 MG EC tablet Take 1 tablet (81 mg total) by mouth daily. Swallow whole.   atorvastatin 40 MG tablet Commonly known as: LIPITOR Take 1 tablet (40 mg total) by mouth daily.   clopidogrel 75 MG tablet Commonly known as: PLAVIX Take 1 tablet (75 mg total) by mouth daily for 21 days.   losartan-hydrochlorothiazide 100-12.5 MG tablet Commonly known as: HYZAAR Take 1 tablet by mouth daily.   metFORMIN 500 MG tablet Commonly known as: GLUCOPHAGE Take 500 mg by mouth 2 (two)  times daily.   pantoprazole 40 MG tablet Commonly known as: Protonix Take 1 tablet (40 mg total) by mouth daily.   traZODone 50 MG tablet Commonly known as: DESYREL Take 50 mg by mouth at bedtime as needed for sleep.               Durable Medical Equipment  (From admission, onward)           Start     Ordered   04/25/20 0810  For home use only DME 3 n 1  Once        04/25/20 0809   04/25/20 0809  For home use only DME Walker rolling  Once       Question Answer Comment  Walker: With 5 Inch Wheels   Patient needs a walker to treat with the following condition Gait abnormality      04/25/20 0809            Follow-up Information     [name], MD. Schedule an appointment as soon as possible for a visit in 1 week(s).    Specialty: Internal Medicine Contact information: [address] [place] [address] [phone]         [facility]. Schedule an appointment as soon as possible for a visit in 6 week(s).   Specialty: Neurology Contact information: [address] [place] [phone]               Allergies  Allergen Reactions   Penicillins     Nervous    Penicillins Anxiety and Other (See Comments)    DID THE REACTION INVOLVE: Swelling of the face/tongue/throat, SOB, or low BP? No Sudden or severe rash/hives, skin peeling, or the inside of the mouth or nose? No Did it require medical treatment? No When did it last happen?      many years ago If all above answers are "NO", may proceed with cephalosporin use.       Consultations: Neurology   Procedures/Studies: CT HEAD WO CONTRAST  Result Date: 04/24/2020 CLINICAL DATA:  Follow-up examination for acute stroke. EXAM: CT HEAD WITHOUT CONTRAST TECHNIQUE: Contiguous axial images were obtained from the base of the skull through the vertex without intravenous contrast. COMPARISON:  Prior MRI from 04/23/2020. FINDINGS: Brain: Previously identified approximate 3 cm acute ischemic infarct involving the left basal ganglia again seen, stable in size and distribution. No hemorrhagic transformation or other complication. No significant mass effect. Previously noted additional subcentimeter subacute infarct involving the right periatrial white matter not well visualized by CT. No other new acute intracranial process. No hemorrhage or new large vessel territory infarct. No mass lesion, midline shift or mass effect. No hydrocephalus or extra-axial fluid collection. Atrophy with chronic microvascular ischemic disease again noted. Remote pontine infarct again noted as well. Vascular: No visible hyperdense vessel. Calcified atherosclerosis at the skull base. Skull: Scalp soft tissues and  calvarium within normal limits. Sinuses/Orbits: Globes and orbital soft tissues demonstrate no acute finding. Paranasal sinuses remain clear. No significant mastoid effusion. Other: None. IMPRESSION: 1. No significant interval change in size and distribution of ischemic infarct involving the left basal ganglia. No hemorrhagic transformation or other complication. 2. Previously identified subcentimeter subacute right periatrial infarct not well visualized by CT. 3. No other new acute intracranial abnormality. Electronically Signed   By: Jeannine Boga M.D.   On: 04/24/2020 04:47   MR ANGIO HEAD WO CONTRAST  Result Date: 04/23/2020 CLINICAL DATA:  Initial evaluation  for acute stroke. EXAM: MRI HEAD WITHOUT CONTRAST MRA HEAD WITHOUT CONTRAST MRA NECK WITHOUT AND WITH CONTRAST TECHNIQUE: Multiplanar, multiecho pulse sequences of the brain and surrounding structures were obtained without intravenous contrast. Angiographic images of the Circle of Willis were obtained using MRA technique without intravenous contrast. Angiographic images of the neck were obtained using MRA technique without and with intravenous contrast. Carotid stenosis measurements (when applicable) are obtained utilizing NASCET criteria, using the distal internal carotid diameter as the denominator. CONTRAST:  27m GADAVIST GADOBUTROL 1 MMOL/ML IV SOLN COMPARISON:  Prior head CT from earlier the same day. FINDINGS: MRI HEAD FINDINGS Brain: Examination degraded by motion artifact. Diffuse prominence of the CSF containing spaces compatible generalized age-related cerebral atrophy. Patchy and confluent T2/FLAIR hyperintensity within the periventricular and deep white matter both cerebral hemispheres most consistent with chronic small vessel ischemic disease. Patchy involvement of the pons noted. Superimposed remote lacunar infarct present at the left paramedian pons. Additional small remote lacunar infarct present at the posterior right  frontoparietal corona radiata (series 15, image 17). Approximate 3 cm focus of curvilinear restricted diffusion seen involving the left basal ganglia, extending from the left lentiform nucleus/subinsular white matter to the left caudate, consistent with acute ischemic infarct. Finding corresponds with abnormality on prior CT. Probable associated trace petechial hemorrhage (series 11, image 30). No frank hemorrhagic transformation or significant regional mass effect. An additional 5 mm focus of mild diffusion abnormality noted within the right periatrial white matter, consistent with a small subacute small vessel type infarct (series 5, image 71). No associated hemorrhage or mass effect. No other diffusion abnormality to suggest acute or subacute ischemia. Gray-white matter differentiation otherwise maintained. No acute intracranial hemorrhage. Subcentimeter chronic microhemorrhage noted at the posterior left temporal occipital region, of doubtful significance isolation (series 11, image 29). No mass lesion, midline shift or mass effect. No hydrocephalus or extra-axial fluid collection. Pituitary gland suprasellar region normal. Midline structures intact. Vascular: Major intracranial vascular flow voids are maintained. Skull and upper cervical spine: Craniocervical junction normal. Bone marrow signal intensity within normal limits. No scalp soft tissue abnormality. Sinuses/Orbits: Patient status post bilateral ocular lens replacement. Globes and orbital soft tissues demonstrate no acute finding. Paranasal sinuses are largely clear. No significant mastoid effusion. Inner ear structures grossly normal. Other: None. MRA HEAD FINDINGS ANTERIOR CIRCULATION: [place]manation examination degraded by motion artifact. Visualized distal cervical segments of the internal carotid arteries are somewhat tortuous but patent with antegrade flow. Petrous, cavernous, and supraclinoid segments widely patent without appreciable stenosis. A1  segments patent bilaterally. Normal anterior communicating artery complex. Anterior cerebral arteries patent to their distal aspects without appreciable stenosis. No M1 stenosis or occlusion. Normal left MCA bifurcation. Right MCA trifurcations. Distal MCA branches well perfused and grossly symmetric. POSTERIOR CIRCULATION: Left V4 segment dominant. Mild atheromatous narrowing of the distal left V4 segment just prior to the vertebrobasilar junction. Left PICA origin patent and grossly normal. Right V4 segment patent without stenosis. Right PICA origin not seen. 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 Basilar otherwise widely patent. Superior cerebellar arteries grossly patent bilaterally. Both PCAs primarily supplied via the basilar, although a small right posterior communicating artery noted. PCAs well perfused to their distal aspects without appreciable stenosis. No aneurysm. MRA NECK FINDINGS AORTIC ARCH: Visualized aortic arch of normal caliber with normal 3 vessel morphology. No hemodynamically significant stenosis seen about the origin of the great vessels. Visualized subclavian arteries widely patent. RIGHT CAROTID SYSTEM: Right common and  internal carotid arteries widely patent without stenosis, evidence for dissection, or occlusion. No significant atheromatous irregularity or narrowing about the right bifurcation. LEFT CAROTID SYSTEM: Left common and internal carotid arteries widely patent without stenosis, evidence for dissection or occlusion. No significant atheromatous narrowing or irregularity about the left carotid bifurcation. VERTEBRAL ARTERIES: Both vertebral arteries arise from the subclavian arteries. No proximal subclavian artery stenosis. Left vertebral artery dominant. Single short-segment moderate stenosis at the pre foraminal left V1 segment (series 1021, image 13). Vertebral arteries otherwise widely patent within the neck without  stenosis, evidence for dissection or occlusion. IMPRESSION: MRI HEAD IMPRESSION: 1. 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 2. Additional 5 mm subacute ischemic nonhemorrhagic right periatrial white matter infarct. 3. 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 MRA HEAD IMPRESSION: 1. Motion degraded exam. 2. Negative intracranial MRA for large vessel occlusion. 3. 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 No other hemodynamically significant or correctable stenosis identified. MRA NECK IMPRESSION: 1. Wide patency of both carotid artery systems within the neck. 2. Single short-segment moderate stenosis involving the pre foraminal left V1 segment. Vertebral arteries otherwise widely patent within the neck. Left vertebral artery dominant. Electronically Signed   By: Jeannine Boga M.D.   On: 04/23/2020 21:43   MR ANGIO NECK W WO CONTRAST  Result Date: 04/23/2020 CLINICAL DATA:  Initial evaluation for acute stroke. EXAM: MRI HEAD WITHOUT CONTRAST MRA HEAD WITHOUT CONTRAST MRA NECK WITHOUT AND WITH CONTRAST TECHNIQUE: Multiplanar, multiecho pulse sequences of the brain and surrounding structures were obtained without intravenous contrast. Angiographic images of the Circle of Willis were obtained using MRA technique without intravenous contrast. Angiographic images of the neck were obtained using MRA technique without and with intravenous contrast. Carotid stenosis measurements (when applicable) are obtained utilizing NASCET criteria, using the distal internal carotid diameter as the denominator. CONTRAST:  30m GADAVIST GADOBUTROL 1 MMOL/ML IV SOLN COMPARISON:  Prior head CT from earlier the same day. FINDINGS: MRI HEAD FINDINGS  Brain: Examination degraded by motion artifact. Diffuse prominence of the CSF containing spaces compatible generalized age-related cerebral atrophy. Patchy and confluent T2/FLAIR hyperintensity within the periventricular and deep white matter both cerebral hemispheres most consistent with chronic small vessel ischemic disease. Patchy involvement of the pons noted. Superimposed remote lacunar infarct present at the left paramedian pons. Additional small remote lacunar infarct present at the posterior right frontoparietal corona radiata (series 15, image 17). Approximate 3 cm focus of curvilinear restricted diffusion seen involving the left basal ganglia, extending from the left lentiform nucleus/subinsular white matter to the left caudate, consistent with acute ischemic infarct. Finding corresponds with abnormality on prior CT. Probable associated trace petechial hemorrhage (series 11, image 30). No frank hemorrhagic transformation or significant regional mass effect. An additional 5 mm focus of mild diffusion abnormality noted within the right periatrial white matter, consistent with a small subacute small vessel type infarct (series 5, image 71). No associated hemorrhage or mass effect. No other diffusion abnormality to suggest acute or subacute ischemia. Gray-white matter differentiation otherwise maintained. No acute intracranial hemorrhage. Subcentimeter chronic microhemorrhage noted at the posterior left temporal occipital region, of doubtful significance isolation (series 11, image 29). No mass lesion, midline shift or mass effect. No hydrocephalus or extra-axial fluid collection. Pituitary gland suprasellar region normal. Midline structures intact. Vascular: Major intracranial vascular flow  voids are maintained. Skull and upper cervical spine: Craniocervical junction normal. Bone marrow signal intensity within normal limits. No scalp soft tissue abnormality. Sinuses/Orbits: Patient status post bilateral  ocular lens replacement. Globes and orbital soft tissues demonstrate no acute finding. Paranasal sinuses are largely clear. No significant mastoid effusion. Inner ear structures grossly normal. Other: None. MRA HEAD FINDINGS ANTERIOR CIRCULATION: [place] examination degraded by motion artifact. Visualized distal cervical segments of the internal carotid arteries are somewhat tortuous but patent with antegrade flow. Petrous, cavernous, and supraclinoid segments widely patent without appreciable stenosis. A1 segments patent bilaterally. Normal anterior communicating artery complex. Anterior cerebral arteries patent to their distal aspects without appreciable stenosis. No M1 stenosis or occlusion. Normal left MCA bifurcation. Right MCA trifurcations. Distal MCA branches well perfused and grossly symmetric. POSTERIOR CIRCULATION: Left V4 segment dominant. Mild atheromatous narrowing of the distal left V4 segment just prior to the vertebrobasilar junction. Left PICA origin patent and grossly normal. Right V4 segment patent without stenosis. Right PICA origin not seen. 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 Basilar otherwise widely patent. Superior cerebellar arteries grossly patent bilaterally. Both PCAs primarily supplied via the basilar, although a small right posterior communicating artery noted. PCAs well perfused to their distal aspects without appreciable stenosis. No aneurysm. MRA NECK FINDINGS AORTIC ARCH: Visualized aortic arch of normal caliber with normal 3 vessel morphology. No hemodynamically significant stenosis seen about the origin of the great vessels. Visualized subclavian arteries widely patent. RIGHT CAROTID SYSTEM: Right common and internal carotid arteries widely patent without stenosis, evidence for dissection, or occlusion. No significant atheromatous irregularity or narrowing about the right bifurcation. LEFT CAROTID SYSTEM: Left common  and internal carotid arteries widely patent without stenosis, evidence for dissection or occlusion. No significant atheromatous narrowing or irregularity about the left carotid bifurcation. VERTEBRAL ARTERIES: Both vertebral arteries arise from the subclavian arteries. No proximal subclavian artery stenosis. Left vertebral artery dominant. Single short-segment moderate stenosis at the pre foraminal left V1 segment (series 1021, image 13). Vertebral arteries otherwise widely patent within the neck without stenosis, evidence for dissection or occlusion. IMPRESSION: MRI HEAD IMPRESSION: 1. 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 2. Additional 5 mm subacute ischemic nonhemorrhagic right periatrial white matter infarct. 3. 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 MRA HEAD IMPRESSION: 1. Motion degraded exam. 2. Negative intracranial MRA for large vessel occlusion. 3. 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 No other hemodynamically significant or correctable stenosis identified. MRA NECK IMPRESSION: 1. Wide patency of both carotid artery systems within the neck. 2. Single short-segment moderate stenosis involving the pre foraminal left V1 segment. Vertebral arteries otherwise widely patent within the neck. Left vertebral artery dominant. Electronically Signed   By: Jeannine Boga M.D.   On: 04/23/2020 21:43   MR BRAIN WO CONTRAST  Result Date: 04/23/2020 CLINICAL DATA:  Initial evaluation for acute stroke. EXAM: MRI HEAD WITHOUT CONTRAST MRA HEAD WITHOUT CONTRAST MRA NECK WITHOUT AND WITH CONTRAST TECHNIQUE: Multiplanar, multiecho pulse sequences of the brain and surrounding structures were obtained without intravenous contrast.  Angiographic images of the Circle of Willis were obtained using MRA technique without intravenous contrast. Angiographic images of the neck were obtained using MRA technique without and with intravenous contrast. Carotid stenosis measurements (when applicable) are obtained utilizing NASCET criteria, using  the distal internal carotid diameter as the denominator. CONTRAST:  14m GADAVIST GADOBUTROL 1 MMOL/ML IV SOLN COMPARISON:  Prior head CT from earlier the same day. FINDINGS: MRI HEAD FINDINGS Brain: Examination degraded by motion artifact. Diffuse prominence of the CSF containing spaces compatible generalized age-related cerebral atrophy. Patchy and confluent T2/FLAIR hyperintensity within the periventricular and deep white matter both cerebral hemispheres most consistent with chronic small vessel ischemic disease. Patchy involvement of the pons noted. Superimposed remote lacunar infarct present at the left paramedian pons. Additional small remote lacunar infarct present at the posterior right frontoparietal corona radiata (series 15, image 17). Approximate 3 cm focus of curvilinear restricted diffusion seen involving the left basal ganglia, extending from the left lentiform nucleus/subinsular white matter to the left caudate, consistent with acute ischemic infarct. Finding corresponds with abnormality on prior CT. Probable associated trace petechial hemorrhage (series 11, image 30). No frank hemorrhagic transformation or significant regional mass effect. An additional 5 mm focus of mild diffusion abnormality noted within the right periatrial white matter, consistent with a small subacute small vessel type infarct (series 5, image 71). No associated hemorrhage or mass effect. No other diffusion abnormality to suggest acute or subacute ischemia. Gray-white matter differentiation otherwise maintained. No acute intracranial hemorrhage. Subcentimeter chronic microhemorrhage noted at the posterior left temporal  occipital region, of doubtful significance isolation (series 11, image 29). No mass lesion, midline shift or mass effect. No hydrocephalus or extra-axial fluid collection. Pituitary gland suprasellar region normal. Midline structures intact. Vascular: Major intracranial vascular flow voids are maintained. Skull and upper cervical spine: Craniocervical junction normal. Bone marrow signal intensity within normal limits. No scalp soft tissue abnormality. Sinuses/Orbits: Patient status post bilateral ocular lens replacement. Globes and orbital soft tissues demonstrate no acute finding. Paranasal sinuses are largely clear. No significant mastoid effusion. Inner ear structures grossly normal. Other: None. MRA HEAD FINDINGS ANTERIOR CIRCULATION: Emanation examination degraded by motion artifact. Visualized distal cervical segments of the internal carotid arteries are somewhat tortuous but patent with antegrade flow. Petrous, cavernous, and supraclinoid segments widely patent without appreciable stenosis. A1 segments patent bilaterally. Normal anterior communicating artery complex. Anterior cerebral arteries patent to their distal aspects without appreciable stenosis. No M1 stenosis or occlusion. Normal left MCA bifurcation. Right MCA trifurcations. Distal MCA branches well perfused and grossly symmetric. POSTERIOR CIRCULATION: Left V4 segment dominant. Mild atheromatous narrowing of the distal left V4 segment just prior to the vertebrobasilar junction. Left PICA origin patent and grossly normal. Right V4 segment patent without stenosis. Right PICA origin not seen. 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 Basilar otherwise widely patent. Superior cerebellar arteries grossly patent bilaterally. Both PCAs primarily supplied via the basilar, although a small right posterior communicating artery noted. PCAs well perfused to their distal aspects without appreciable stenosis.  No aneurysm. MRA NECK FINDINGS AORTIC ARCH: Visualized aortic arch of normal caliber with normal 3 vessel morphology. No hemodynamically significant stenosis seen about the origin of the great vessels. Visualized subclavian arteries widely patent. RIGHT CAROTID SYSTEM: Right common and internal carotid arteries widely patent without stenosis, evidence for dissection, or occlusion. No significant atheromatous irregularity or narrowing about the right bifurcation. LEFT CAROTID SYSTEM: Left common and internal carotid arteries widely patent without stenosis, evidence for dissection or occlusion. No significant atheromatous narrowing or irregularity about the left carotid bifurcation. VERTEBRAL ARTERIES: Both vertebral arteries arise from the subclavian arteries. No proximal subclavian artery stenosis. Left vertebral artery dominant. Single short-segment moderate stenosis at  the pre foraminal left V1 segment (series 1021, image 13). Vertebral arteries otherwise widely patent within the neck without stenosis, evidence for dissection or occlusion. IMPRESSION: MRI HEAD IMPRESSION: 1. 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 2. Additional 5 mm subacute ischemic nonhemorrhagic right periatrial white matter infarct. 3. 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 MRA HEAD IMPRESSION: 1. Motion degraded exam. 2. Negative intracranial MRA for large vessel occlusion. 3. 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 No other hemodynamically significant or correctable stenosis identified. MRA NECK IMPRESSION: 1. Wide patency of both carotid artery systems within the neck. 2. Single short-segment moderate stenosis involving the pre foraminal left V1 segment.  Vertebral arteries otherwise widely patent within the neck. Left vertebral artery dominant. Electronically Signed   By: Jeannine Boga M.D.   On: 04/23/2020 21:43   ECHOCARDIOGRAM COMPLETE  Result Date: 04/24/2020    ECHOCARDIOGRAM REPORT   Patient Name:   Gregory Nunez. Date of Exam: 04/24/2020 Medical Rec #:  [id number]               Height:       71.0 in Accession #:    [id number]              Weight:       186.1 lb Date of Birth:  11-Aug-1945               BSA:          2.045 m Patient Age:    15 years                BP:           166/95 mmHg Patient Gender: M                       HR:           87 bpm. Exam Location:  Inpatient Procedure: 2D Echo, Cardiac Doppler and Color Doppler Indications:    Stroke 434.91 / I163.9  History:        Patient has no prior history of Echocardiogram examinations.                 Signs/Symptoms:Dyspnea; Risk Factors:Hypertension, Diabetes and                 Dyslipidemia. Cancer. GERD.  Sonographer:    Jonelle Sidle Dance Referring Phys: [id number] [place]  1. Left ventricular ejection fraction, by estimation, is 60 to 65%. The left ventricle has normal function. The left ventricle has no regional wall motion abnormalities. There is mild concentric left ventricular hypertrophy. Left ventricular diastolic parameters are consistent with Grade I diastolic dysfunction (impaired relaxation).  2. Right ventricular systolic function is normal. The right ventricular size is normal.  3. The mitral valve is normal in structure. Mild mitral valve regurgitation. No evidence of mitral stenosis.  4. The aortic valve is normal in structure. Aortic valve regurgitation is not visualized. No aortic stenosis is present.  5. The inferior vena cava is normal in size with greater than 50% respiratory variability, suggesting right atrial pressure of 3 mmHg. Conclusion(s)/Recommendation(s): No intracardiac source of embolism detected on this transthoracic study. A transesophageal  echocardiogram is recommended to exclude cardiac source of embolism if clinically indicated. FINDINGS  Left  Ventricle: Left ventricular ejection fraction, by estimation, is 60 to 65%. The left ventricle has normal function. The left ventricle has no regional wall motion abnormalities. The left ventricular internal cavity size was normal in size. There is  mild concentric left ventricular hypertrophy. Left ventricular diastolic parameters are consistent with Grade I diastolic dysfunction (impaired relaxation). Normal left ventricular filling pressure. Right Ventricle: The right ventricular size is normal. No increase in right ventricular wall thickness. Right ventricular systolic function is normal. Left Atrium: Left atrial size was normal in size. Right Atrium: Right atrial size was normal in size. Pericardium: There is no evidence of pericardial effusion. Mitral Valve: The mitral valve is normal in structure. Mild mitral valve regurgitation. No evidence of mitral valve stenosis. Tricuspid Valve: The tricuspid valve is normal in structure. Tricuspid valve regurgitation is mild . No evidence of tricuspid stenosis. Aortic Valve: The aortic valve is normal in structure. Aortic valve regurgitation is not visualized. No aortic stenosis is present. Pulmonic Valve: The pulmonic valve was normal in structure. Pulmonic valve regurgitation is not visualized. No evidence of pulmonic stenosis. Aorta: The aortic root is normal in size and structure. Venous: The inferior vena cava is normal in size with greater than 50% respiratory variability, suggesting right atrial pressure of 3 mmHg. IAS/Shunts: No atrial level shunt detected by color flow Doppler.  LEFT VENTRICLE PLAX 2D LVIDd:         4.20 cm  Diastology LVIDs:         3.30 cm  LV e' medial:    6.64 cm/s LV PW:         1.10 cm  LV E/e' medial:  7.4 LV IVS:        1.10 cm  LV e' lateral:   6.96 cm/s LVOT diam:     2.20 cm  LV E/e' lateral: 7.0 LV SV:         63 LV SV Index:    31 LVOT Area:     3.80 cm  RIGHT VENTRICLE            IVC RV Basal diam:  3.00 cm    IVC diam: 1.90 cm RV S prime:     8.49 cm/s TAPSE (M-mode): 1.7 cm LEFT ATRIUM             Index       RIGHT ATRIUM           Index LA diam:        3.20 cm 1.56 cm/m  RA Area:     12.30 cm LA Vol (A2C):   67.1 ml 32.81 ml/m RA Volume:   28.30 ml  13.84 ml/m LA Vol (A4C):   53.9 ml 26.36 ml/m LA Biplane Vol: 62.0 ml 30.32 ml/m  AORTIC VALVE LVOT Vmax:   86.20 cm/s LVOT Vmean:  55.300 cm/s LVOT VTI:    0.166 m  AORTA Ao Root diam: 3.40 cm Ao Asc diam:  3.30 cm MITRAL VALVE MV Area (PHT): 2.10 cm    SHUNTS MV Decel Time: 361 msec    Systemic VTI:  0.17 m MV E velocity: 49.05 cm/s  Systemic Diam: 2.20 cm MV A velocity: 63.40 cm/s MV E/A ratio:  0.77 Ena Dawley MD Electronically signed by Ena Dawley MD Signature Date/Time: 04/24/2020/11:59:21 AM    Final    CT HEAD CODE STROKE WO CONTRAST  Result Date: 04/23/2020 CLINICAL DATA:  Code stroke. Acute neuro deficit. Right facial droop and confusion EXAM: CT HEAD WITHOUT  CONTRAST TECHNIQUE: Contiguous axial images were obtained from the base of the skull through the vertex without intravenous contrast. COMPARISON:  CT head 11/07/2018 FINDINGS: Brain: New area of ill-defined hypodensity in the left lenticular nucleus likely a subacute infarct. New hypodensity in the left paramedian pons which is relatively well defined and likely is a chronic lacunar infarct but could be subacute. Generalized atrophy. Chronic microvascular ischemic changes in the white matter are relatively mild. Negative for hemorrhage or mass. Ventricle size normal. Vascular: Negative for hyperdense vessel Skull: Negative Sinuses/Orbits: Paranasal sinuses clear. Bilateral cataract extraction. Other: None ASPECTS ([place] Stroke Program Early CT Score) - Ganglionic level infarction (caudate, lentiform nuclei, internal capsule, insula, M1-M3 cortex): 6 - Supraganglionic infarction (M4-M6 cortex): 3 Total  score (0-10 with 10 being normal): 9 IMPRESSION: 1. New ill-defined hypodensity left lenticular nucleus suspicious for subacute infarct. 2. New hypodensity in the left paramedian pons consistent with acute infarction which is likely chronic but could be recent. 3. Negative for hemorrhage 4. ASPECTS is 9 5. Code stroke imaging results were communicated on 04/23/2020 at 5:36 pm to provider Lorrin Goodell via text page Electronically Signed   By: Franchot Gallo M.D.   On: 04/23/2020 17:39     Subjective: Patient seen and examined at bedside, resting comfortably.  States wants to discharge home today.  Discussed with patient neurology recommends loop recorder for concerns of cryptogenic stroke and embolic source, patient declined placement.  Also declines SNF or CIR placement but okay for home health.  No other questions or concerns at this time.  Denies headache, no fever/chills/night sweats, no nausea/vomiting/diarrhea, no abdominal pain, no chest pain, no palpitations, no fatigue, no paresthesias.  No acute events overnight per nursing staff.  Discharge Exam: Vitals:   04/25/20 0329 04/25/20 0800  BP: (!) 157/73 (!) 167/89  Pulse: 99 (!) 107  Resp: 18 18  Temp: 97.6 F (36.4 C) 97.9 F (36.6 C)  SpO2: 100% 100%   Vitals:   04/25/20 0012 04/25/20 0057 04/25/20 0329 04/25/20 0800  BP: (!) 198/110 (!) 173/106 (!) 157/73 (!) 167/89  Pulse: 89 95 99 (!) 107  Resp: _0 Temp: 97.8 F (36.6 C)  97.6 F (36.4 C) 97.9 F (36.6 C)  TempSrc: Oral  Oral Oral  SpO2: 99% 100% 100% 100%  Weight:      Height:        General: Pt is alert, awake, not in acute distress Cardiovascular: RRR, S1/S2 +, no rubs, no gallops Respiratory: CTA bilaterally, no wheezing, no rhonchi, oxygenating well on room air Abdominal: Soft, NT, ND, bowel sounds + Extremities: no edema, no cyanosis    The results of significant diagnostics from this hospitalization (including imaging, microbiology, ancillary and  laboratory) are listed below for reference.     Microbiology: Recent Results (from the past 240 hour(s))  Resp Panel by RT-PCR (Flu A&B, Covid) Nasopharyngeal Swab     Status: None   Collection Time: 04/23/20  7:25 PM   Specimen: Nasopharyngeal Swab; Nasopharyngeal(NP) swabs in vial transport medium  Result Value Ref Range Status   SARS Coronavirus 2 by RT PCR NEGATIVE NEGATIVE Final    Comment: (NOTE) SARS-CoV-2 target nucleic acids are NOT DETECTED.  The SARS-CoV-2 RNA is generally detectable in upper respiratory specimens during the acute phase of infection. The lowest concentration of SARS-CoV-2 viral copies this assay can detect is 138 copies/mL. A negative result does not preclude SARS-Cov-2 infection and should not be used as the sole basis  for treatment or other patient management decisions. A negative result may occur with  improper specimen collection/handling, submission of specimen other than nasopharyngeal swab, presence of viral mutation(s) within the areas targeted by this assay, and inadequate number of viral copies(<138 copies/mL). A negative result must be combined with clinical observations, patient history, and epidemiological information. The expected result is Negative.  Fact Sheet for Patients:  EntrepreneurPulse.com.au  Fact Sheet for Healthcare Providers:  IncredibleEmployment.be  This test is no t yet approved or cleared by the [place] FDA and  has been authorized for detection and/or diagnosis of SARS-CoV-2 by FDA under an Emergency Use Authorization (EUA). This EUA will remain  in effect (meaning this test can be used) for the duration of the COVID-19 declaration under Section 564(b)(1) of the Act, 21 U.S.C.section 360bbb-3(b)(1), unless the authorization is terminated  or revoked sooner.       Influenza A by PCR NEGATIVE NEGATIVE Final   Influenza B by PCR NEGATIVE NEGATIVE Final    Comment: (NOTE) The  Xpert Xpress SARS-CoV-2/FLU/RSV plus assay is intended as an aid in the diagnosis of influenza from Nasopharyngeal swab specimens and should not be used as a sole basis for treatment. Nasal washings and aspirates are unacceptable for Xpert Xpress SARS-CoV-2/FLU/RSV testing.  Fact Sheet for Patients: EntrepreneurPulse.com.au  Fact Sheet for Healthcare Providers: IncredibleEmployment.be  This test is not yet approved or cleared by the [place] FDA and has been authorized for detection and/or diagnosis of SARS-CoV-2 by FDA under an Emergency Use Authorization (EUA). This EUA will remain in effect (meaning this test can be used) for the duration of the COVID-19 declaration under Section 564(b)(1) of the Act, 21 U.S.C. section 360bbb-3(b)(1), unless the authorization is terminated or revoked.  Performed at [facility], [place] [address] [place], [place] [address]      Labs: BNP (last 3 results) No results for input(s): BNP in the last 8760 hours. Basic Metabolic Panel: Recent Labs  Lab 04/23/20 1715 04/23/20 1723 04/25/20 0209  NA 137 141 140  K 3.5 3.6 3.2*  CL 104 105 108  CO2 19*  --  28  GLUCOSE 110* 108* 110*  BUN 38* 38* 27*  CREATININE 2.35* 2.20* 1.66*  CALCIUM 9.3  --  8.6*  MG  --   --  1.8   Liver Function Tests: Recent Labs  Lab 04/23/20 1715  AST 68*  ALT 39  ALKPHOS 45  BILITOT 0.8  PROT 8.0  ALBUMIN 3.4*   No results for input(s): LIPASE, AMYLASE in the last 168 hours. No results for input(s): AMMONIA in the last 168 hours. CBC: Recent Labs  Lab 04/23/20 1715 04/23/20 1723  WBC 5.2  --   NEUTROABS 3.5  --   HGB 11.6* 11.9*  HCT 35.7* 35.0*  MCV 95.2  --   PLT 196  --    Cardiac Enzymes: No results for input(s): CKTOTAL, CKMB, CKMBINDEX, TROPONINI in the last 168 hours. BNP: Invalid input(s): POCBNP CBG: Recent Labs  Lab 04/24/20 0000 04/24/20 1233 04/24/20 1614 04/24/20 2155  04/25/20 0646  GLUCAP 86 175* 157* 135* 88   D-Dimer No results for input(s): DDIMER in the last 72 hours. Hgb A1c Recent Labs    04/24/20 0435  HGBA1C 6.5*   Lipid Profile Recent Labs    04/24/20 0435  CHOL 141  HDL 38*  LDLCALC 82  TRIG 105  CHOLHDL 3.7   Thyroid function studies No results for input(s): TSH, T4TOTAL, T3FREE, THYROIDAB  in the last 72 hours.  Invalid input(s): FREET3 Anemia work up No results for input(s): VITAMINB12, FOLATE, FERRITIN, TIBC, IRON, RETICCTPCT in the last 72 hours. Urinalysis    Component Value Date/Time   COLORURINE COLORLESS (A) 04/15/2018 2221   APPEARANCEUR CLEAR 04/15/2018 2221   LABSPEC 1.025 01/10/2019 1132   PHURINE 6.5 01/10/2019 1132   GLUCOSEU 500 (A) 01/10/2019 1132   HGBUR MODERATE (A) 01/10/2019 1132   BILIRUBINUR NEGATIVE 01/10/2019 1132   KETONESUR NEGATIVE 01/10/2019 1132   PROTEINUR >=300 (A) 01/10/2019 1132   UROBILINOGEN 1.0 01/10/2019 1132   NITRITE NEGATIVE 01/10/2019 1132   LEUKOCYTESUR TRACE (A) 01/10/2019 1132   Sepsis Labs Invalid input(s): PROCALCITONIN,  WBC,  LACTICIDVEN Microbiology Recent Results (from the past 240 hour(s))  Resp Panel by RT-PCR (Flu A&B, Covid) Nasopharyngeal Swab     Status: None   Collection Time: 04/23/20  7:25 PM   Specimen: Nasopharyngeal Swab; Nasopharyngeal(NP) swabs in vial transport medium  Result Value Ref Range Status   SARS Coronavirus 2 by RT PCR NEGATIVE NEGATIVE Final    Comment: (NOTE) SARS-CoV-2 target nucleic acids are NOT DETECTED.  The SARS-CoV-2 RNA is generally detectable in upper respiratory specimens during the acute phase of infection. The lowest concentration of SARS-CoV-2 viral copies this assay can detect is 138 copies/mL. A negative result does not preclude SARS-Cov-2 infection and should not be used as the sole basis for treatment or other patient management decisions. A negative result may occur with  improper specimen collection/handling,  submission of specimen other than nasopharyngeal swab, presence of viral mutation(s) within the areas targeted by this assay, and inadequate number of viral copies(<138 copies/mL). A negative result must be combined with clinical observations, patient history, and epidemiological information. The expected result is Negative.  Fact Sheet for Patients:  EntrepreneurPulse.com.au  Fact Sheet for Healthcare Providers:  IncredibleEmployment.be  This test is no t yet approved or cleared by the [place] FDA and  has been authorized for detection and/or diagnosis of SARS-CoV-2 by FDA under an Emergency Use Authorization (EUA). This EUA will remain  in effect (meaning this test can be used) for the duration of the COVID-19 declaration under Section 564(b)(1) of the Act, 21 U.S.C.section 360bbb-3(b)(1), unless the authorization is terminated  or revoked sooner.       Influenza A by PCR NEGATIVE NEGATIVE Final   Influenza B by PCR NEGATIVE NEGATIVE Final    Comment: (NOTE) The Xpert Xpress SARS-CoV-2/FLU/RSV plus assay is intended as an aid in the diagnosis of influenza from Nasopharyngeal swab specimens and should not be used as a sole basis for treatment. Nasal washings and aspirates are unacceptable for Xpert Xpress SARS-CoV-2/FLU/RSV testing.  Fact Sheet for Patients: EntrepreneurPulse.com.au  Fact Sheet for Healthcare Providers: IncredibleEmployment.be  This test is not yet approved or cleared by the [place] FDA and has been authorized for detection and/or diagnosis of SARS-CoV-2 by FDA under an Emergency Use Authorization (EUA). This EUA will remain in effect (meaning this test can be used) for the duration of the COVID-19 declaration under Section 564(b)(1) of the Act, 21 U.S.C. section 360bbb-3(b)(1), unless the authorization is terminated or revoked.  Performed at [facility], [place] [address], [place], [place] [address]      Time coordinating discharge: Over 30 minutes  SIGNED:   Miyoko Hashimi J [place] DO  [facility] 04/25/2020, 8:13 AM

## 2020-04-25 NOTE — Evaluation (Signed)
Clinical/Bedside Swallow Evaluation Patient Details  Name: Gregory Nunez. MRN: [id number] Date of Birth: [birth year]  Today's Date: 04/25/2020 Time:        Past Medical History:  Past Medical History:  Diagnosis Date  . Cancer [facility])    prostate  . Cataract    OD  . Depression   . Diabetes mellitus without complication ([place])   . Diabetic retinopathy ([place])    NPDR OU  . GERD (gastroesophageal reflux disease)    if drinks alcohol  . 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 Hyperlipidemia   . Hypertension   . Hypertensive retinopathy    OU  . Malignant neoplasm of prostate ([place]) 01/09/2014  . Pancreatitis   . Prostate cancer ([place]) 12/19/13   Gleason 4+3=7, volume 31.31 cc  . Shortness of breath dyspnea    with exertion    Past Surgical History:  Past Surgical History:  Procedure Laterality Date  . BIOPSY  04/16/2018   Procedure: BIOPSY;  Surgeon: Juanita Craver, MD;  Location: [facility];  Service: Endoscopy;;  . biopsy on throat     hx of   . CATARACT EXTRACTION Bilateral    Dr. Quentin Ore  . ESOPHAGOGASTRODUODENOSCOPY Left 04/16/2018   Procedure: ESOPHAGOGASTRODUODENOSCOPY (EGD);  Surgeon: Juanita Craver, MD;  Location: [facility];  Service: Endoscopy;  Laterality: Left;  . EYE SURGERY    . FOOT SURGERY    . HOT HEMOSTASIS N/A 04/16/2018   Procedure: HOT HEMOSTASIS (ARGON PLASMA COAGULATION/BICAP);  Surgeon: Juanita Craver, MD;  Location: [facility];  Service: Endoscopy;  Laterality: N/A;  . LYMPHADENECTOMY Bilateral 02/27/2014   Procedure: BILATERAL LYMPHADENECTOMY;  Surgeon: Alexis Frock, MD;  Location: [facility];  Service: Urology;  Laterality: Bilateral;  . PROSTATE BIOPSY  12/2013   Gleason 4+3=7, volume 31.31 cc  . ROBOT ASSISTED LAPAROSCOPIC RADICAL PROSTATECTOMY N/A 02/27/2014   Procedure: ROBOTIC ASSISTED LAPAROSCOPIC RADICAL PROSTATECTOMY WITH INDOCYANINE GREEN DYE;  Surgeon:  Alexis Frock, MD;  Location: [facility];  Service: Urology;  Laterality: N/A;   HPI:  75 y.o. 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 2. Additional 5 mm subacute ischemic nonhemorrhagic right periatrial white matter infarct.  Assessment / Plan / Recommendation Clinical Impression  Pt presents with oropharyngeal dysphagia characterized by decreased oral manipulation/propulsion; reduced right lingual/labial sensation noted with meal consumption with bolus residue present within right buccal cavity evidenced by pocketing  and material on right labial area without awareness noted by pt when masticating/preparing material for swallow.  Prolonged oral phase noted with delayed onset of swallow with min-mod cues provided for swallowing material and clearing residue from right buccal cavity with lingual sweep.  Nursing informed SLP pt coughed with medication administration with larger sips of thin/medication earlier in the morning.  Pt did not exhibit any overt s/s of aspiration during sips of thin via cup (no straw utilized) during meal.  Mastication impaired throughout consumption with fatigue noted by end of meal.  Pt would benefit from diet initiation of Dysphagia 3/thin (without straws) and meds whole in puree to prevent risk of aspiration.  ST will f/u while in acute setting for diet tolerance/safety as well as speech/language and cognitive needs.  SLP Visit Diagnosis: Dysphagia, oropharyngeal phase (R13.12)    Aspiration Risk  Mild aspiration risk    Diet Recommendation   Dyphagia 3/thin liquids; no straws  Medication Administration: Whole meds with puree    Other  Recommendations Oral  Care Recommendations: Oral care BID   Follow up Recommendations Other (comment) (TBD)      Frequency and Duration min 2x/week  1 week       Prognosis Prognosis for Safe Diet Advancement: Good      Swallow Study   General Date of Onset: 04/23/20 HPI: 75 y.o. male with medical history significant for type 2 diabetes with diabetic retinopathy, hypertension, hyperlipidemia, prostate cancer, and alcohol use who presents to the ED for evaluation of right-sided weakness Previous Swallow Assessment: [facility] swallow screen 04/24/19 passed Respiratory Status: Room air History of Recent Intubation: No Behavior/Cognition: Alert;Cooperative;Requires cueing Oral Care Completed by SLP: Other (Comment) (n/a due to pt consuming lunch tray when SLP entered room) Oral Cavity - Dentition: Missing dentition Vision: Functional for self-feeding Baseline Vocal Quality: Low vocal intensity Volitional Cough: Other (Comment) (Not observed d/t pt eating lunch) Volitional Swallow: Able to elicit    Oral/Motor/Sensory Function Overall Oral Motor/Sensory Function: Mild impairment Facial ROM: Reduced right Facial Symmetry: Abnormal symmetry right Facial Strength: Reduced right Facial Sensation: Reduced right Lingual Strength: Reduced Lingual Sensation: Reduced   Ice Chips Ice chips: Not tested   Thin Liquid Thin Liquid: Impaired Presentation: Cup;Self Fed Pharyngeal  Phase Impairments: Suspected delayed Swallow Other Comments: Nursing stated pt coughed when taking meds with liquids    Nectar Thick Nectar Thick Liquid: Not tested   Honey Thick Honey Thick Liquid: Not tested   Puree Puree: Impaired Presentation: Self Fed Oral Phase Impairments: Reduced lingual movement/coordination Oral Phase Functional Implications: Prolonged oral transit Pharyngeal Phase Impairments: Suspected delayed Swallow   Solid     Solid: Impaired Presentation: Self Fed Oral Phase Impairments: Reduced lingual  movement/coordination;Impaired mastication Oral Phase Functional Implications: Impaired mastication;Prolonged oral transit;Right lateral sulci pocketing Pharyngeal Phase Impairments: Suspected delayed Swallow      Elvina Sidle, M.S., CCC-SLP 04/25/2020,9:15 AM

## 2020-04-25 NOTE — Discharge Instructions (Signed)
Physical Therapy After a Stroke After a stroke, some people experience physical changes or problems. Physical therapy may be prescribed to help you recover and overcome problems such as:  Inability to move (paralysis) or weakness, typically affecting one side of the body.  Trouble with balance.  Pain, a pins and needles sensation, or numbness in certain parts of the body. You may also have difficulty feeling touch, pressure, or changes in temperature.  Involuntary muscle tightening (spasticity).  Stiffness in muscles and joints.  Altered coordination and reflexes. What causes physical disability after a stroke? A stroke can damage parts of your brain that control your body's normal functions, including your ability to move and to keep your balance. The types of physical problems you have will depend on how severe the stroke was and where it was located in the brain. Weakness or paralysis may affect just your fingers and hands, a whole leg or arm, or an entire side of your body. What is physical therapy? Physical therapy involves using exercises, stretches, and activities to help you regain movement and independence after your stroke. Physical therapy may focus on one or more of the following:  Range of motion. This can help with movement and reduce muscle stiffness.  Balance. This helps to lower your risk of falling.  Position changes or transfers, such as moving from sitting to standing or from a chair to a bed.  Coordination, such as getting an object from a shelf.  Muscle strength. Muscles may be strengthened with weights or by repeating certain motions.  Functional mobility. This may include stair training or learning how to use a wheelchair, walker, or cane.  Walking (gait training).  Activities of daily living, such as getting out of the car or buttoning a shirt. Why is physical therapy important? It is important to do exercises and follow your rehabilitation plan as told by  your physical therapist. Physical therapy can:  Help you regain independence.  Prevent injury from falls by building strength and balance.  Lower your risk of blood clots.  Lower your risk of skin sores (pressure injuries).  Increase physical activity and exercise. This may help lower your risk for another stroke.  Help reduce pain. When will therapy start and where will I have therapy? Your health care provider will decide when it is best for you to start therapy. In some cases, people start rehabilitation, including physical therapy, as soon as they are medically stable, which may be 24-48 hours after a stroke. Rehabilitation can take place in a few different places, based on your needs. It may take place in:  The hospital or an in-patient rehabilitation hospital.  An outpatient rehabilitation facility.  A long-term care facility.  A community rehabilitation clinic.  Your home. What are assistive devices? Assistive devices are tools to help you move, maintain balance, and manage daily tasks while recovering from a stroke. Your physical therapist may recommend and help you learn to use:  Equipment to help you move, such as wheelchairs, canes, or walkers.  Braces or splints to keep your arms, hands, legs, or feet in a comfortable and safe position.  Bathtub benches or grab bars to keep you safe in the bathroom.  Special utensils, bowls, and plates that allow you to eat with one hand. It is important to use these devices as told by your health care provider. Summary  After a stroke, some people may experience physical disabilities, such as weakness or paralysis, pain, or balance problems.  Physical therapy  involves exercises, stretches, and activities that help to improve your ability to move and to handle daily tasks.  Physical therapy exercises focus on restoring range of motion, balance, coordination, muscle strength, and the ability to move (mobility).  Physical  therapy can help you regain independence, prevent falls, and allow you to live a more active lifestyle after a stroke. This information is not intended to replace advice given to you by your health care provider. Make sure you discuss any questions you have with your health care provider. Document Revised: 07/27/2018 Document Reviewed: 07/12/2016 Elsevier Patient Education  [employer] [place]. Ischemic Stroke  An ischemic stroke (cerebrovascular accident, or CVA) is the sudden death of brain tissue that occurs when an area of the brain does not get enough oxygen. It is a medical emergency that must be treated right away. An ischemic stroke can cause permanent loss of brain function. This can cause problems with how different parts of your body function. What are the causes? This condition is caused by a decrease of oxygen supply to an area of the brain, which may be the result of:  A small blood clot (embolus) or a buildup of plaque in the blood vessels (atherosclerosis) that blocks blood flow in the brain.  An abnormal heart rhythm (atrial fibrillation).  A blocked or damaged artery in the head or neck. Sometimes the cause of stroke is not known (cryptogenic). What increases the risk? Certain factors may make you more likely to develop this condition. Some of these factors are things that you can change, such as:  Obesity.  Smoking cigarettes.  Taking oral birth control, especially if you also use tobacco.  Physical inactivity.  Excessive alcohol use.  Use of illegal drugs, especially cocaine and methamphetamine. Other risk factors include:  High blood pressure (hypertension).  High cholesterol.  Diabetes mellitus.  Heart disease.  Being [place] American, Native American, Hispanic, or [place] Native.  Being over age 85.  Family history of stroke.  Previous history of blood clots, stroke, or transient ischemic attack (TIA).  Sickle cell disease.  Being a woman with a  history of preeclampsia.  Migraine headache.  Sleep apnea.  Irregular heartbeats, such as atrial fibrillation.  Chronic inflammatory diseases, such as rheumatoid arthritis or lupus.  Blood clotting disorders (hypercoagulable state). What are the signs or symptoms? Symptoms of this condition usually develop suddenly, or you may notice them after waking up from sleep. Symptoms may include sudden:  Weakness or numbness in your face, arm, or leg, especially on one side of your body.  Trouble walking or difficulty moving your arms or legs.  Loss of balance or coordination.  Confusion.  Slurred speech (dysarthria).  Trouble speaking, understanding speech, or both (aphasia).  Vision changes--such as double vision, blurred vision, or loss of vision--in one or both eyes.  Dizziness.  Nausea and vomiting.  Severe headache with no known cause. The headache is often described as the worst headache ever experienced. If possible, make note of the exact time that you last felt like your normal self and what time your symptoms started. Tell your health care provider. If symptoms come and go, this could be a sign of a warning stroke, or TIA. Get help right away, even if you feel better. How is this diagnosed? This condition may be diagnosed based on:  Your symptoms, your medical history, and a physical exam.  CT scan of the brain.  MRI.  CT angiogram. This test uses a computer to take X-rays  of your arteries. A dye may be injected into your blood to show the inside of your blood vessels more clearly.  MRI angiogram. This is a type of MRI that is used to evaluate the blood vessels.  Cerebral angiogram. This test uses X-rays and a dye to show the blood vessels in the brain and neck. You may need to see a health care provider who specializes in stroke care. A stroke specialist can be seen in person or through communication using telephone or television technology (telemedicine). Other  tests may also be done to find the cause of the stroke, such as:  Electrocardiogram (ECG).  Continuous heart monitoring.  Echocardiogram.  Transesophageal echocardiogram (TEE).  Carotid ultrasound.  A scan of the brain circulation.  Blood tests.  Sleep study to check for sleep apnea. How is this treated? Treatment for this condition will depend on the duration, severity, and cause of your symptoms and on the area of the brain affected. It is very important to get treatment at the first sign of stroke symptoms. Some treatments work better if they are done within 3-6 hours of the onset of stroke symptoms. These initial treatments may include:  Aspirin.  Medicines to control blood pressure.  Medicine given by injection to dissolve the blood clot (thrombolytic).  Treatments given directly to the affected artery to remove or dissolve the blood clot. Other treatment options may include:  Oxygen.  IV fluids.  Medicines to thin the blood (anticoagulants or antiplatelets).  Procedures to increase blood flow. Medicines and changes to your diet may be used to help treat and manage risk factors for stroke, such as diabetes, high cholesterol, and high blood pressure. After a stroke, you may work with physical, speech, mental health, or occupational therapists to help you recover. Follow these instructions at home: Medicines  Take over-the-counter and prescription medicines only as told by your health care provider.  If you were told to take a medicine to thin your blood, such as aspirin or an anticoagulant, take it exactly as told by your health care provider. ? Taking too much blood-thinning medicine can cause bleeding. ? If you do not take enough blood-thinning medicine, you will not have the protection that you need against another stroke and other problems.  Understand the side effects of taking anticoagulant medicine. When taking this type of medicine, make sure you: ? Hold  pressure over any cuts for longer than usual. ? Tell your dentist and other health care providers that you are taking anticoagulants before you have any procedures that may cause bleeding. ? Avoid activities that may cause trauma or injury. Eating and drinking  Follow instructions from your health care provider about diet.  Eat healthy foods.  If your ability to swallow was affected by the stroke, you may need to take steps to avoid choking, such as: ? Taking small bites when eating. ? Eating foods that are soft or pureed. Safety  Follow instructions from your health care team about physical activity.  Use a walker or cane as told by your health care provider.  Take steps to create a safe home environment in order to reduce the risk of falls. This may include: ? Having your home looked at by specialists. ? Installing grab bars in the bedroom and bathroom. ? Using safety equipment, such as raised toilets and a seat in the shower. General instructions  Do not use any tobacco products, such as cigarettes, chewing tobacco, and e-cigarettes. If you need help quitting, ask  your health care provider.  Limit alcohol intake to no more than 1 drink a day for nonpregnant women and 2 drinks a day for men. One drink equals 12 oz of beer, 5 oz of wine, or 1 oz of hard liquor.  If you need help to stop using drugs or alcohol, ask your health care provider about a referral to a program or specialist.  Maintain an active and healthy lifestyle. Get regular exercise as told by your health care provider.  Keep all follow-up visits as told by your health care provider, including visits with all specialists on your health care team. This is important. How is this prevented? Your risk of another stroke can be decreased by managing high blood pressure, high cholesterol, diabetes, heart disease, sleep apnea, and obesity. It can also be decreased by quitting smoking, limiting alcohol, and staying physically  active. Your health care provider will continue to work with you on measures to prevent short-term and long-term complications of stroke. Get help right away if:   You have any symptoms of a stroke. "BE FAST" is an easy way to remember the main warning signs of a stroke: ? B - Balance. Signs are dizziness, sudden trouble walking, or loss of balance. ? E - Eyes. Signs are trouble seeing or a sudden change in vision. ? F - Face. Signs are sudden weakness or numbness of the face, or the face or eyelid drooping on one side. ? A - Arms. Signs are weakness or numbness in an arm. This happens suddenly and usually on one side of the body. ? S - Speech. Signs are sudden trouble speaking, slurred speech, or trouble understanding what people say. ? T - Time. Time to call emergency services. Write down what time symptoms started.  You have other signs of a stroke, such as: ? A sudden, severe headache with no known cause. ? Nausea or vomiting. ? Seizure.  These symptoms may represent a serious problem that is an emergency. Do not wait to see if the symptoms will go away. Get medical help right away. Call your local emergency services (911 in the [place] Do not drive yourself to the hospital. Summary  An ischemic stroke (cerebrovascular accident, or CVA) is the sudden death of brain tissue that occurs when an area of the brain does not get enough oxygen.  Symptoms of this condition usually develop suddenly, or you may notice them after waking up from sleep.  It is very important to get treatment at the first sign of stroke symptoms. Stroke is a medical emergency that must be treated right away. This information is not intended to replace advice given to you by your health care provider. Make sure you discuss any questions you have with your health care provider. Document Revised: 12/23/2017 Document Reviewed: 07/02/2015 Elsevier Patient Education  2020 [facility] Discharge After a Stroke   Being discharged from the hospital after a stroke can feel overwhelming. Many things may be different, and it is normal to feel scared or anxious. Some stroke survivors may be able to return to their homes, and others may need more specialized care on a temporary or permanent basis. Your stroke care team will work with you to develop a discharge plan that is best for you. Ask questions if you do not understand something. Invite a friend or family member to participate in discharge planning. Understanding and following your discharge plan can help to prevent another stroke or other problems. Understanding your medicines After  a stroke, your health care provider may prescribe one or more types of medicine. It is important to take medicines exactly as told by your health care provider. Serious harm, such as another stroke, can happen if you are unable to take your medicine exactly as prescribed. Make sure you understand:  What medicine to take.  Why you are taking the medicine.  How and when to take it.  If it can be taken with your other medicines and herbal supplements.  Possible side effects.  When to call your health care provider if you have any side effects.  How you will get and pay for your medicines. Medical assistance programs may be able to help you pay for prescription medicines if you cannot afford them. If you are taking an anticoagulant, be sure to take it exactly as told by your health care provider. This type of medicine can increase the risk of bleeding because it works to prevent blood from clotting. You may need to take certain precautions to prevent bleeding. You should contact your health care provider if you have:  Bleeding or bruising.  A fall or other injury to your head.  Blood in your urine or stool (feces). Planning for home safety  Take steps to prevent falls, such as installing grab bars or using a shower chair. Ask a friend or family member to get needed  things in place before you go home if possible. A therapist can come to your home to make recommendations for safety equipment. Ask your health care provider if you would benefit from this service or from home care. Getting needed equipment Ask your health care provider for a list of any medical equipment and supplies you will need at home. These may include items such as:  Walkers.  Canes.  Wheelchairs.  Hand-strengthening devices.  Special eating utensils. Medical equipment can be rented or purchased, depending on your insurance coverage. Check with your insurance company about what is covered. Keeping follow-up visits After a stroke, you will need to follow up regularly with a health care provider. You may also need rehabilitation, which can include physical therapy, occupational therapy, or speech-language therapy. Keeping these appointments is very important to your recovery after a stroke. Be sure to bring your medicine list and discharge papers with you to your appointments. If you need help to keep track of your schedule, use a calendar or appointment reminder. Preventing another stroke Having a stroke puts you at risk for another stroke in the future. Ask your health care provider what actions you can take to lower the risk. These may include:  Increasing how much you exercise.  Making a healthy eating plan.  Quitting smoking.  Managing other health conditions, such as high blood pressure, high cholesterol, or diabetes.  Limiting alcohol use. Knowing the warning signs of a stroke  Make sure you understand the signs of a stroke. Before you leave the hospital, you will receive information outlining the stroke warning signs. Share these with your friends and family members. "BE FAST" is an easy way to remember the main warning signs of a stroke:  B - Balance. Signs are dizziness, sudden trouble walking, or loss of balance.  E - Eyes. Signs are trouble seeing or a sudden  change in vision.  F - Face. Signs are sudden weakness or numbness of the face, or the face or eyelid drooping on one side.  A - Arms. Signs are weakness or numbness in an arm. This happens suddenly and  usually on one side of the body.  S - Speech. Signs are sudden trouble speaking, slurred speech, or trouble understanding what people say.  T - Time. Time to call emergency services. Write down what time symptoms started. Other signs of stroke may include:  A sudden, severe headache with no known cause.  Nausea or vomiting.  Seizure. These symptoms may represent a serious problem that is an emergency. Do not wait to see if the symptoms will go away. Get medical help right away. Call your local emergency services (911 in the [place] Do not drive yourself to the hospital. Make note of the time that you had your first symptoms. Your emergency responders or emergency room staff will need to know this information. Summary  Being discharged from the hospital after a stroke can feel overwhelming. It is normal to feel scared or anxious.  Make sure you take medicines exactly as told by your health care provider.  Know the warning signs of a stroke, and get help right way if you have any of these symptoms. "BE FAST" is an easy way to remember the main warning signs of a stroke. This information is not intended to replace advice given to you by your health care provider. Make sure you discuss any questions you have with your health care provider. Document Revised: 12/27/2018 Document Reviewed: 07/09/2016 Elsevier Patient Education  [place].

## 2020-04-26 DIAGNOSIS — I10 Essential (primary) hypertension: Secondary | ICD-10-CM | POA: Diagnosis not present

## 2020-04-26 LAB — GLUCOSE, CAPILLARY
Glucose-Capillary: 132 mg/dL — ABNORMAL HIGH (ref 70–99)
Glucose-Capillary: 175 mg/dL — ABNORMAL HIGH (ref 70–99)
Glucose-Capillary: 212 mg/dL — ABNORMAL HIGH (ref 70–99)
Glucose-Capillary: 224 mg/dL — ABNORMAL HIGH (ref 70–99)

## 2020-04-26 LAB — BASIC METABOLIC PANEL
Anion gap: 11 (ref 5–15)
BUN: 23 mg/dL (ref 8–23)
CO2: 21 mmol/L — ABNORMAL LOW (ref 22–32)
Calcium: 9.2 mg/dL (ref 8.9–10.3)
Chloride: 111 mmol/L (ref 98–111)
Creatinine, Ser: 1.51 mg/dL — ABNORMAL HIGH (ref 0.61–1.24)
GFR, Estimated: 49 mL/min — ABNORMAL LOW (ref >60)
Glucose, Bld: 141 mg/dL — ABNORMAL HIGH (ref 70–99)
Potassium: 3.9 mmol/L (ref 3.5–5.1)
Sodium: 143 mmol/L (ref 135–145)

## 2020-04-26 MED ORDER — AMLODIPINE BESYLATE 10 MG PO TABS
10.0000 mg | ORAL_TABLET | Freq: Every day | ORAL | Status: DC
  Administered 2020-04-26 – 2020-05-08 (×13): 10 mg via ORAL
  Filled 2020-04-26 (×13): qty 1

## 2020-04-26 MED ORDER — SODIUM CHLORIDE 0.9 % IV SOLN: INTRAVENOUS | Status: AC

## 2020-04-26 MED ORDER — SODIUM CHLORIDE 0.9 % IV BOLUS
1000.0000 mL | Freq: Once | INTRAVENOUS | Status: AC
  Administered 2020-04-26: 1000 mL via INTRAVENOUS

## 2020-04-26 NOTE — Progress Notes (Signed)
PROGRESS NOTE    Gregory Nunez Gregory Nunez.  NAT:[id number] DOB: 1945/09/11 DOA: 04/23/2020 PCP: [name], MD    Brief Narrative:  Gregory Kovalcik. is a 75 year old male with past medical history significant for type 2 diabetes mellitus with diabetic retinopathy, essential hypertension, hyperlipidemia, prostate cancer and alcohol use disorder who presented to the ED with right-sided weakness.  Patient was found wandering around a local [facility] and EMS was activated with notable right-sided facial droop and altered mental status.  Upon chart review, concern for excessive alcohol abuse from prior presentations.  Patient reports his last drink was roughly 12 days ago.  Does report chronic tobacco use of 1.5 packs/day.  In the ED, BP 190/94, HR 95, RR 15, SPO2 97% room air.  WBC 5.2, hemoglobin 11.6, platelets 196.  Sodium 137, potassium 3.5, CO2 19, BUN 38, creatinine 2.35, glucose 110.  AST 68, ALT 39, alk phos 45, total bilirubin 0.8.  SARS-CoV-2/influenza A/B PCR negative.  CT head without contrast with new hypodensity in left paramedian pons consistent with acute infarction and ill-defined hypodensity left lenticular nuclear suspicious for subacute infarct.  Neurology was consulted.  Patient received 325 mg aspirin and 1 L NS bolus in the ED.  Also received Librium and Ativan as needed per CIWA protocol for concern of EtOH withdrawal.  Hospitalist service consulted for further evaluation and management of acute CVA.   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  CT head without contrast notable for ischemic infarct left basal ganglia without hemorrhage/mass.  MR brain with 3 mm acute ischemic nonhemorrhagic infarct left basal ganglia and 5 mm subacute infarct right periatrial white matter.  MRA head/neck with no large vessel occlusion but with short segment moderate stenosis mid basilar artery, short segment moderate stenosis 3 frontal left V1 segment, patent carotid arteries.  Hemoglobin A1c 6.5.  Lipid panel with total cholesterol 141, HDL 38, LDL 82, and triglycerides 105.  TTE with LVEF 60-65%, G1DD, mild MR, no LV regional wall motion abnormalities, RV systolic function normal, IVC normal in size.  --Neurology following, appreciate assistance --PT/OT recommends SNF --SLP evaluation: Dysphagia 3 diet, mechanical soft with thin liquids, meds with pure --Aspirin 81 mg p.o. daily, Plavix 75 mg p.o. daily x 3 weeks  followed by aspirin alone --Started atorvastatin 40 mg p.o. daily --No arrhythmias noted on telemetry such far, offered loop recorder placement, patient declined  --Pending SNF placement; CIR declined 2/2 poor social support  Acute Renal Failure Etiology likely prerenal from poor oral intake.  Received IV fluid bolus on admission. --Cr 2.35>2.20>1.66>1.51 (baseline 1.05-1.1) --Avoid nephrotoxins, renally dose all medications --Repeat BMP in a.m.  Essential hypertension On amlodipine 10 mg at home.   --restarted amlodipine 21m PO daily --Continue to monitor BP closely and slowly adjust to normal BP over next few days  Type 2 diabetes mellitus Hemoglobin A1c 6.5.  On Metformin 500 mg twice daily at home. --Sensitive insulin sliding scale for coverage --CBGs  before every meal/at bedtime  Hyperlipidemia Lipid panel with total cholesterol 141, HDL 38, LDL 82, and triglycerides 105. --Atorvastatin 40 mg p.o. daily  Tobacco use  disorder Reports smoking 1.5 packs/day.  Counseled on need for complete cessation --Nicotine patch  EtOH use disorder --CIWAA protocol with symptom triggered Ativan --Thiamine, folic acid, multivitamin  Cognitive impairment Patient seen by Occupational Therapy, scored 2/10 on the medicog; which correlates to cognitive limitations that are required for safe completion of medication management and cooking.  Patient seen by psychiatry on 04/25/2020 and deemed to possess capacity for medical decision-making.  Etiology of his impairment likely confounded by his significant EtOH abuse disorder.   DVT prophylaxis: Lovenox Code Status: Full code Family Communication: Updated patient extensively at bedside  Disposition Plan:  Status is: Inpatient  Remains inpatient appropriate because:Ongoing diagnostic testing needed not appropriate for outpatient work up, Unsafe d/c plan, IV treatments appropriate due to intensity of illness or inability to take PO and Inpatient level of care appropriate due to severity of illness   Dispo: The patient is from: Home              Anticipated d/c is to: SNF              Anticipated d/c date is: 1 day              Patient currently is medically stable to d/c.   Consultants:   Neurology  Psychiatry  Procedures:   TTE  Antimicrobials:   None   Subjective: Patient seen and examined bedside, resting comfortably.  Continues with waxing and waning cognition.  No specific complaints this morning.  Awaiting SNF placement. Denies headache, no fever/chills/night sweats, no nausea/vomiting/diarrhea, no chest pain, no palpitations, no shortness of breath, no abdominal pain.  No acute events overnight per nursing staff.  Objective: Vitals:   04/26/20 0006 04/26/20 0354 04/26/20 0728 04/26/20 1121  BP: (!) 183/98 (!) 177/97 (!) 186/97 (!) 182/91  Pulse: 99 (!) 101 (!) 101 (!) 103  Resp: _0 Temp: (!) 97.5 F (36.4 C) 98.5 F (36.9 C) 97.6 F (36.4  C) 98.9 F (37.2 C)  TempSrc: Oral Oral Oral Oral  SpO2: 100% 100% 100% 99%  Weight:      Height:       No intake or output data in the 24 hours ending 04/26/20 1312 Filed Weights   04/23/20 1743  Weight: 84.4 kg    Examination:  General exam: Appears calm and comfortable  Respiratory system: Clear to auscultation. Respiratory effort normal.  Oxygenating well on room air Cardiovascular system: S1 & S2 heard, RRR. No JVD, murmurs, rubs, gallops or clicks. No pedal edema. Gastrointestinal system: Abdomen is nondistended, soft and nontender. No organomegaly or masses felt. Normal bowel sounds heard. Central nervous system: Alert and oriented to time.  Right-sided facial droop, otherwise no focal neurological deficits. Extremities: Symmetric 5 x 5 power. Skin: No rashes, lesions or ulcers Psychiatry: Judgement and insight appear poor. Mood & affect appropriate.     Data Reviewed: I have personally reviewed following labs and imaging studies  CBC: Recent Labs  Lab 04/23/20 1715 04/23/20 1723  WBC 5.2  --   NEUTROABS 3.5  --   HGB 11.6* 11.9*  HCT 35.7* 35.0*  MCV 95.2  --   PLT 196  --    Basic Metabolic Panel: Recent Labs  Lab 04/23/20 1715 04/23/20 1723 04/25/20 0209 04/26/20 0433  NA 137 141 140 143  K 3.5 3.6 3.2* 3.9  CL 104 105 108 111  CO2 19*  --  28 21*  GLUCOSE 110* 108* 110* 141*  BUN 38* 38* 27* 23  CREATININE 2.35* 2.20* 1.66* 1.51*  CALCIUM 9.3  --  8.6* 9.2  MG  --   --  1.8  --    GFR: Estimated Creatinine Clearance: 47.1 mL/min (A) (by C-G formula based on SCr of 1.51 mg/dL (H)). Liver Function Tests: Recent Labs  Lab 04/23/20 1715  AST 68*  ALT 39  ALKPHOS 45  BILITOT 0.8  PROT 8.0  ALBUMIN 3.4*   No results for input(s): LIPASE, AMYLASE in the last 168 hours. No results for input(s): AMMONIA in the last 168 hours. Coagulation Profile: Recent Labs  Lab 04/23/20 1715  INR 1.0   Cardiac Enzymes: No results for input(s):  CKTOTAL, CKMB, CKMBINDEX, TROPONINI in the last 168 hours. BNP (last 3 results) No results for input(s): PROBNP in the last 8760 hours. HbA1C: Recent Labs    04/24/20 0435  HGBA1C 6.5*   CBG: Recent Labs  Lab 04/25/20 0646 04/25/20 1146 04/25/20 1722 04/26/20 0628 04/26/20 1118  GLUCAP 88 170* 138* 132* 175*   Lipid Profile: Recent Labs    04/24/20 0435  CHOL 141  HDL 38*  LDLCALC 82  TRIG 105  CHOLHDL 3.7   Thyroid Function Tests: No results for input(s): TSH, T4TOTAL, FREET4, T3FREE, THYROIDAB in the last 72 hours. Anemia Panel: No results for input(s): VITAMINB12, FOLATE, FERRITIN, TIBC, IRON, RETICCTPCT in the last 72 hours. Sepsis Labs: No results for input(s): PROCALCITON, LATICACIDVEN in the last 168 hours.  Recent Results (from the past 240 hour(s))  Resp Panel by RT-PCR (Flu A&B, Covid) Nasopharyngeal Swab     Status: None   Collection Time: 04/23/20  7:25 PM   Specimen: Nasopharyngeal Swab; Nasopharyngeal(NP) swabs in vial transport medium  Result Value Ref Range Status   SARS Coronavirus 2 by RT PCR NEGATIVE NEGATIVE Final    Comment: (NOTE) SARS-CoV-2 target nucleic acids are NOT DETECTED.  The SARS-CoV-2 RNA is generally detectable in upper respiratory specimens during the acute phase of infection. The lowest concentration of SARS-CoV-2 viral copies this assay can detect is 138 copies/mL. A negative result does not preclude SARS-Cov-2 infection and should not be used as the sole basis for treatment or other patient management decisions. A negative result may occur with  improper specimen collection/handling, submission of specimen other than nasopharyngeal swab, presence of viral mutation(s) within the areas targeted by this assay, and inadequate number of viral copies(<138 copies/mL). A negative result must be combined with clinical observations, patient history, and epidemiological information. The expected result is Negative.  Fact Sheet for  Patients:  EntrepreneurPulse.com.au  Fact Sheet for Healthcare Providers:  IncredibleEmployment.be  This test is no t yet approved or cleared by the [place] FDA and  has been authorized for detection and/or diagnosis of SARS-CoV-2 by FDA under an Emergency Use Authorization (EUA). This EUA will remain  in effect (meaning this test can be used) for the duration of the COVID-19 declaration under Section 564(b)(1) of the Act, 21 U.S.C.section 360bbb-3(b)(1), unless the authorization is terminated  or revoked sooner.       Influenza A by PCR NEGATIVE NEGATIVE Final   Influenza B by PCR NEGATIVE NEGATIVE Final    Comment: (NOTE) The Xpert Xpress SARS-CoV-2/FLU/RSV plus assay is intended as an aid in the diagnosis of influenza from Nasopharyngeal swab specimens and should not be used  as a sole basis for treatment. Nasal washings and aspirates are unacceptable for Xpert Xpress SARS-CoV-2/FLU/RSV testing.  Fact Sheet for Patients: EntrepreneurPulse.com.au  Fact Sheet for Healthcare Providers: IncredibleEmployment.be  This test is not yet approved or cleared by the [place] FDA and has been authorized for detection and/or diagnosis of SARS-CoV-2 by FDA under an Emergency Use Authorization (EUA). This EUA will remain in effect (meaning this test can be used) for the duration of the COVID-19 declaration under Section 564(b)(1) of the Act, 21 U.S.C. section 360bbb-3(b)(1), unless the authorization is terminated or revoked.  Performed at [facility], [place] [address], [place], [place] [address]          Radiology Studies: No results found.      Scheduled Meds: . amLODipine  10 mg Oral Daily  . aspirin EC  81 mg Oral Daily  . atorvastatin  40 mg Oral Daily  . clopidogrel  75 mg Oral Daily  . enoxaparin (LOVENOX) injection  40 mg Subcutaneous Q24H  . insulin aspart  0-9 Units  Subcutaneous TID WC  . nicotine  21 mg Transdermal Daily  . thiamine  100 mg Oral Daily   Or  . thiamine  100 mg Intravenous Daily   Continuous Infusions:   LOS: 2 days    Time spent: 32 minutes spent on chart review, discussion with nursing staff, consultants, updating family and interview/physical exam; more than 50% of that time was spent in counseling and/or coordination of care.    Gregory Nunez J [place] DO [facility] Available via Epic secure chat 7am-7pm After these hours, please refer to coverage provider listed on [email or web address] 04/26/2020, 1:12 PM

## 2020-04-26 NOTE — Progress Notes (Signed)
Physical Therapy Treatment Patient Details Name: Gregory Nunez. MRN: [id number] DOB: 12-03-1945 Today's Date: 04/26/2020    History of Present Illness Pt is a 75 year old male with a medical hx significant for dyspnea, alcohol use, prostate cancer, pancreatitis, HTN, HAV, DM2, and depression who presented with R-sided weakness, R-sided facial droop, and AMS when found wandering around a local [facility]. 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 They also showed superimposed remote lacunar infarcts involving the L paramedian pons and posterior R frontoparietal corona radiata. NIHSS of 3 and tPA not administered    PT Comments    Increased time and effort coming to sit EOB with min guard. Increased assistance from minA first rep to modA second sit to stand rep from EOB, suggesting decreased leg strength and endurance. Pt making progress with mobility, ambulating an increased distance of ~15 ft around edge of bed with a RW and modA. Pt ambulates with hips, trunk, and knees flexed throughout despite cues to correct and demonstrates decreased weight shift to L, limiting his ability to pick up his R for swing. Also, he displays decreased R hip flexor strength limiting his R foot advancement with swing as he has a step-to gait pattern. Provided verbal and tactile cues to correct, but poor carryover. Thus, focused remainder of session on strengthening bilat legs, with pt requiring AAROM on R for SLR. BP changes (orthostatic hypotension) with symptoms limited further mobility progression today, see General Comments below (RN and DO notified). Will continue to follow acutely. Changed d/c recs to SNF.  Follow Up Recommendations  Supervision/Assistance - 24 hour;SNF     Equipment Recommendations  Rolling walker with 5" wheels;3in1 (PT)    Recommendations for Other Services        Precautions / Restrictions Precautions Precautions: Fall Precaution Comments: monitor BP (HTN and orthostatic hypotension) Restrictions Weight Bearing Restrictions: No    Mobility  Bed Mobility Overal bed mobility: Needs Assistance Bed Mobility: Supine to Sit     Supine to sit: Min guard;HOB elevated     General bed mobility comments: Ues of bed rails and HOB elevated to come to sit EOB with extra time and effort, min guard for safety. Difficulty scooting all the way to EOB, esp on R hip.  Transfers Overall transfer level: Needs assistance Equipment used: Rolling walker (2 wheeled) Transfers: Sit to/from Stand Sit to Stand: Mod assist         General transfer comment: Sit to stand 2x from EOB with cues for hand placement with minA first rep then modA second rep, extra time and effort to power up. Cues for R hand placement on RW upon coming to stand.  Ambulation/Gait Ambulation/Gait assistance: Mod assist Gait Distance (Feet): 15 Feet Assistive device: Rolling walker (2 wheeled) Gait Pattern/deviations: Decreased step length - right;Decreased stride length;Decreased weight shift to left;Shuffle;Step-to pattern;Trunk flexed Gait velocity: reduced Gait velocity interpretation: <1.31 ft/sec, indicative of household ambulator General Gait Details: Ambulates at slow pace with trunk sway, trunk and knees flexed, and knee instability without buckling noted. Decreased bilat step length, esp on R with step-to gait, with evident shuffling, despite cues to correct. Provided verbal and tactile cues to inc weight shift to L and to advance R leg with poor carryover. Bouts of LOB requiring modA to recover.   Stairs             Wheelchair  Mobility    Modified Rankin (Stroke Patients Only) Modified Rankin (Stroke Patients Only) Pre-Morbid Rankin Score: No symptoms Modified Rankin: Moderately severe disability     Balance Overall balance assessment: Needs  assistance Sitting-balance support: Feet supported;No upper extremity supported Sitting balance-Leahy Scale: Fair Sitting balance - Comments: no UE support, min gaurd for safety sitting statically EOB.   Standing balance support: Bilateral upper extremity supported;During functional activity Standing balance-Leahy Scale: Poor Standing balance comment: Reliant on UE support and external support for balance, with LOB bouts and assistance to recover.                            Cognition Arousal/Alertness: Awake/alert Behavior During Therapy: Impulsive Overall Cognitive Status: Impaired/Different from baseline Area of Impairment: Attention;Following commands;Safety/judgement;Memory;Problem solving;Awareness;Orientation                 Orientation Level: Disoriented to;Time (stated correct month initially but then changed date to "June 20" and perseverated on that date) Current Attention Level: Sustained Memory: Decreased short-term memory Following Commands: Follows one step commands consistently;Follows one step commands with increased time;Follows multi-step commands inconsistently Safety/Judgement: Decreased awareness of safety;Decreased awareness of deficits Awareness: Emergent Problem Solving: Slow processing;Difficulty sequencing;Requires verbal cues General Comments: Came into room initially due to pt impulsively trying to get OOB, x2 reps, despite cues otherwise. Pt perseverating on calling nephew and going home and getting agitated with idea of staying in hospital longer. Poor word finding, having to change statements or numbers often. STM deficits and poor safety awareness and awareness of deficits as pt required repeated cues and extra time to proccess the cues to respond correctly and even attempting to avoid recognizing his difficulty with ambulating and his decreased balance stating he would not fall at home yet he would lose balance during session.       Exercises General Exercises - Lower Extremity Ankle Circles/Pumps: Strengthening;Both;10 reps;Supine Short Arc Quad: Both;10 reps;Supine (in recliner) Straight Leg Raises: Strengthening;Both;10 reps;Supine (in recliner; AAROM on R)    General Comments General comments (skin integrity, edema, etc.): BP start of session supine HOB elevated 140/80; BP sitting ~123/93; BP after gait bout 86/60 (little dizzy); BP once reclined in chair 111/67 (less dizzy); BP 125/78 end of session reclined      Pertinent Vitals/Pain Pain Assessment: Faces Faces Pain Scale: Hurts a little bit Pain Location: headache Pain Descriptors / Indicators: Aching Pain Intervention(s): Limited activity within patient's tolerance;Monitored during session;Repositioned (when asked what number 0-10 he stated "I don't know")    Home Living                      Prior Function            PT Goals (current goals can now be found in the care plan section) Acute Rehab PT Goals Patient Stated Goal: to go home PT Goal Formulation: With patient Time For Goal Achievement: 05/08/20 Potential to Achieve Goals: Good Progress towards PT goals: Progressing toward goals    Frequency    Min 4X/week      PT Plan Discharge plan needs to be updated    Co-evaluation              AM-PAC PT "6 Clicks" Mobility   Outcome Measure  Help needed turning from your back to your side while in a flat bed without using bedrails?: A Little Help needed moving from lying on your back to sitting on  the side of a flat bed without using bedrails?: A Little Help needed moving to and from a bed to a chair (including a wheelchair)?: A Lot Help needed standing up from a chair using your arms (e.g., wheelchair or bedside chair)?: A Lot Help needed to walk in hospital room?: A Lot Help needed climbing 3-5 steps with a railing? : Total 6 Click Score: 13    End of Session Equipment Utilized During Treatment: Gait belt Activity  Tolerance: Treatment limited secondary to medical complications (Comment) (BP changes) Patient left: in chair;with call bell/phone within reach;with chair alarm set Nurse Communication: Mobility status;Other (comment) (BP) PT Visit Diagnosis: Unsteadiness on feet (R26.81);Other abnormalities of gait and mobility (R26.89);Muscle weakness (generalized) (M62.81);Difficulty in walking, not elsewhere classified (R26.2);Other symptoms and signs involving the nervous system (R29.898)     Time: 0086-7619 PT Time Calculation (min) (ACUTE ONLY): 42 min  Charges:  $Gait Training: 8-22 mins $Therapeutic Exercise: 8-22 mins $Therapeutic Activity: 8-22 mins                     Moishe Spice, PT, DPT Acute Rehabilitation Services  Pager: [phone] Office: [place] 04/26/2020, 4:02 PM

## 2020-04-26 NOTE — TOC Progression Note (Addendum)
Transition of Care [facility]) - Progression Note    Patient Details  Name: Gregory Nunez. MRN: [id number] Date of Birth: Jul 26, 1945  [facility] CM/SW Contact  Coralee [place], [place] Phone Number: 04/26/2020, 3:24 PM  Clinical Narrative:    CSW attempted to contact pt's nephew to give bed offers. Pt has one offer, for [facility]. CSW left Voicemail, will continue to follow for DC planning.   CSW spoke with pt's nephew and gave bed offers. He would like to do some research, and will call back in the morning.  Expected Discharge Plan: Skilled Nursing Facility Barriers to Discharge: SNF Pending bed offer,Insurance Authorization  Expected Discharge Plan and Services Expected Discharge Plan: [place] In-house Referral: Clinical Social Work Discharge Planning Services: CM Consult Post Acute Care Choice: [place] Living arrangements for the past 2 months: Apartment Expected Discharge Date: 04/25/20                                     Social Determinants of Health (SDOH) Interventions    Readmission Risk Interventions No flowsheet data found.

## 2020-04-27 DIAGNOSIS — I10 Essential (primary) hypertension: Secondary | ICD-10-CM | POA: Diagnosis not present

## 2020-04-27 LAB — BASIC METABOLIC PANEL
Anion gap: 11 (ref 5–15)
BUN: 26 mg/dL — ABNORMAL HIGH (ref 8–23)
CO2: 21 mmol/L — ABNORMAL LOW (ref 22–32)
Calcium: 9 mg/dL (ref 8.9–10.3)
Chloride: 110 mmol/L (ref 98–111)
Creatinine, Ser: 1.46 mg/dL — ABNORMAL HIGH (ref 0.61–1.24)
GFR, Estimated: 51 mL/min — ABNORMAL LOW (ref >60)
Glucose, Bld: 123 mg/dL — ABNORMAL HIGH (ref 70–99)
Potassium: 3.6 mmol/L (ref 3.5–5.1)
Sodium: 142 mmol/L (ref 135–145)

## 2020-04-27 LAB — GLUCOSE, CAPILLARY
Glucose-Capillary: 139 mg/dL — ABNORMAL HIGH (ref 70–99)
Glucose-Capillary: 194 mg/dL — ABNORMAL HIGH (ref 70–99)
Glucose-Capillary: 205 mg/dL — ABNORMAL HIGH (ref 70–99)
Glucose-Capillary: 134 mg/dL — ABNORMAL HIGH (ref 70–99)

## 2020-04-27 LAB — SARS CORONAVIRUS 2 (TAT 6-24 HRS): SARS Coronavirus 2: NEGATIVE

## 2020-04-27 MED ORDER — CARVEDILOL 12.5 MG PO TABS
12.5000 mg | ORAL_TABLET | Freq: Two times a day (BID) | ORAL | Status: DC
Start: 2020-04-27 — End: 2020-04-28
  Administered 2020-04-27 (×2): 12.5 mg via ORAL
  Filled 2020-04-27 (×2): qty 1

## 2020-04-27 MED ORDER — HYDRALAZINE HCL 20 MG/ML IJ SOLN
5.0000 mg | Freq: Once | INTRAMUSCULAR | Status: AC
  Administered 2020-04-27: 5 mg via INTRAVENOUS
  Filled 2020-04-27: qty 1

## 2020-04-27 NOTE — Progress Notes (Signed)
PROGRESS NOTE    Gregory Nunez.  YNW:[id number] DOB: [birth year] DOA: 04/23/2020 PCP: [name], MD    Brief Narrative:  Gregory [place]. is a 75 year old male with past medical history significant for type 2 diabetes mellitus with diabetic retinopathy, essential hypertension, hyperlipidemia, prostate cancer and alcohol use disorder who presented to the ED with right-sided weakness.  Patient was found wandering around a local [facility] and EMS was activated with notable right-sided facial droop and altered mental status.  Upon chart review, concern for excessive alcohol abuse from prior presentations.  Patient reports his last drink was roughly 12 days ago.  Does report chronic tobacco use of 1.5 packs/day.  In the ED, BP 190/94, HR 95, RR 15, SPO2 97% room air.  WBC 5.2, hemoglobin 11.6, platelets 196.  Sodium 137, potassium 3.5, CO2 19, BUN 38, creatinine 2.35, glucose 110.  AST 68, ALT 39, alk phos 45, total bilirubin 0.8.  SARS-CoV-2/influenza A/B PCR negative.  CT head without contrast with new hypodensity in left paramedian pons consistent with acute infarction and ill-defined hypodensity left lenticular nuclear suspicious for subacute infarct.  Neurology was consulted.  Patient received 325 mg aspirin and 1 L NS bolus in the ED.  Also received Librium and Ativan as needed per CIWA protocol for concern of EtOH withdrawal.  Hospitalist service consulted for further evaluation and management of acute CVA.   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  CT head without contrast notable for ischemic infarct left basal ganglia without hemorrhage/mass.  MR brain with 3 mm acute ischemic nonhemorrhagic infarct left basal ganglia and 5 mm subacute infarct right periatrial white matter.  MRA head/neck with no large vessel occlusion but with short segment moderate stenosis mid basilar artery, short segment moderate stenosis 3 frontal left V1 segment, patent carotid arteries.  Hemoglobin A1c 6.5.  Lipid panel with total cholesterol 141, HDL 38, LDL 82, and triglycerides 105.  TTE with LVEF 60-65%, G1DD, mild MR, no LV regional wall motion abnormalities, RV systolic function normal, IVC normal in size.  --Neurology following, appreciate assistance --PT/OT recommends SNF --SLP evaluation: Dysphagia 3 diet, mechanical soft with thin liquids, meds with pure --Aspirin 81 mg p.o. daily, Plavix 75 mg p.o. daily x 3 weeks  followed by aspirin alone --Started atorvastatin 40 mg p.o. daily --No arrhythmias noted on telemetry such far, offered loop recorder placement, patient declined  --Pending SNF placement; CIR declined 2/2 poor social support, accepted at [facility]; pending bed  Acute Renal Failure Etiology likely prerenal from poor oral intake.  Received IV fluid bolus on admission. --Cr 2.35>2.20>1.66>1.51>1.49 (baseline 1.1) --continue NS at 175mL/hr --Avoid nephrotoxins, renally dose all medications --Repeat BMP in a.m.  Essential hypertension On amlodipine 10 mg at home.   --restarted amlodipine 10mg  PO daily --started on coreg 12.5mg  BID --Continue to monitor BP closely and slowly adjust to normal BP over next few days  Type 2 diabetes mellitus Hemoglobin A1c 6.5.  On  Metformin 500 mg twice daily at home. --Sensitive insulin sliding scale for coverage --CBGs before every meal/at bedtime  Hyperlipidemia Lipid panel with total cholesterol 141,  HDL 38, LDL 82, and triglycerides 105. --Atorvastatin 40 mg p.o. daily  Tobacco use disorder Reports smoking 1.5 packs/day.  Counseled on need for complete cessation --Nicotine patch  EtOH use disorder --CIWAA protocol with symptom triggered Ativan --Thiamine, folic acid, multivitamin  Cognitive impairment Patient seen by Occupational Therapy, scored 2/10 on the medicog; which correlates to cognitive limitations that are required for safe completion of medication management and cooking.  Patient seen by psychiatry on 04/25/2020 and deemed to possess capacity for medical decision-making.  Etiology of his impairment likely confounded by his significant EtOH abuse disorder.   DVT prophylaxis: Lovenox Code Status: Full code Family Communication: Updated patient extensively at bedside  Disposition Plan:  Status is: Inpatient  Remains inpatient appropriate because:Ongoing diagnostic testing needed not appropriate for outpatient work up, Unsafe d/c plan, IV treatments appropriate due to intensity of illness or inability to take PO and Inpatient level of care appropriate due to severity of illness   Dispo: The patient is from: Home              Anticipated d/c is to: SNF              Anticipated d/c date is: 1 day              Patient currently is medically stable to d/c.   Consultants:   Neurology  Psychiatry  Procedures:   TTE  Antimicrobials:   None   Subjective: Patient seen and examined bedside, resting comfortably.  Eating breakfast.  Alert and oriented this morning, although waxes and wanes during the hospitalization especially with his cognitive dysfunction.  Accepted at [facility] SNF, awaiting bed placement. Denies headache, no fever/chills/night sweats, no nausea/vomiting/diarrhea, no chest pain, no palpitations, no shortness of breath, no abdominal pain.  No acute events overnight per nursing staff.  Objective: Vitals:   04/27/20 0400 04/27/20 0556 04/27/20 0631  04/27/20 0829  BP: (!) 186/88 (!) 193/90 (!) 165/80 (!) 177/79  Pulse: 100 99 100 (!) 107  Resp: $Remo'20  20 20  'QpJnw$ Temp: 98.4 F (36.9 C)   98.2 F (36.8 C)  TempSrc: Oral   Oral  SpO2: 100% 100% 100% 100%  Weight:      Height:        Intake/Output Summary (Last 24 hours) at 04/27/2020 1130 Last data filed at 04/26/2020 2359 Gross per 24 hour  Intake 600 ml  Output --  Net 600 ml   Filed Weights   04/23/20 1743  Weight: 84.4 kg    Examination:  General exam: Appears calm and comfortable  Respiratory system: Clear to auscultation. Respiratory effort normal.  Oxygenating well on room air Cardiovascular system: S1 & S2 heard, RRR. No JVD, murmurs, rubs, gallops or clicks. No pedal edema. Gastrointestinal system: Abdomen is nondistended, soft and nontender. No organomegaly or masses felt. Normal bowel sounds heard. Central nervous system: Alert and oriented to person/place/time.  Right-sided facial droop, otherwise no focal neurological deficits. Extremities: Symmetric 5 x 5 power. Skin: No rashes, lesions or ulcers Psychiatry: Judgement and insight appear poor. Mood & affect appropriate.     Data Reviewed: I have personally reviewed following labs and imaging studies  CBC: Recent Labs  Lab 04/23/20 1715 04/23/20 1723  WBC 5.2  --   NEUTROABS 3.5  --   HGB 11.6* 11.9*  HCT 35.7*  35.0*  MCV 95.2  --   PLT 196  --    Basic Metabolic Panel: Recent Labs  Lab 04/23/20 1715 04/23/20 1723 04/25/20 0209 04/26/20 0433 04/27/20 0232  NA 137 141 140 143 142  K 3.5 3.6 3.2* 3.9 3.6  CL 104 105 108 111 110  CO2 19*  --  28 21* 21*  GLUCOSE 110* 108* 110* 141* 123*  BUN 38* 38* 27* 23 26*  CREATININE 2.35* 2.20* 1.66* 1.51* 1.46*  CALCIUM 9.3  --  8.6* 9.2 9.0  MG  --   --  1.8  --   --    GFR: Estimated Creatinine Clearance: 48.7 mL/min (A) (by C-G formula based on SCr of 1.46 mg/dL (H)). Liver Function Tests: Recent Labs  Lab 04/23/20 1715  AST 68*  ALT 39  ALKPHOS  45  BILITOT 0.8  PROT 8.0  ALBUMIN 3.4*   No results for input(s): LIPASE, AMYLASE in the last 168 hours. No results for input(s): AMMONIA in the last 168 hours. Coagulation Profile: Recent Labs  Lab 04/23/20 1715  INR 1.0   Cardiac Enzymes: No results for input(s): CKTOTAL, CKMB, CKMBINDEX, TROPONINI in the last 168 hours. BNP (last 3 results) No results for input(s): PROBNP in the last 8760 hours. HbA1C: No results for input(s): HGBA1C in the last 72 hours. CBG: Recent Labs  Lab 04/26/20 0628 04/26/20 1118 04/26/20 1841 04/26/20 2157 04/27/20 0614  GLUCAP 132* 175* 212* 224* 139*   Lipid Profile: No results for input(s): CHOL, HDL, LDLCALC, TRIG, CHOLHDL, LDLDIRECT in the last 72 hours. Thyroid Function Tests: No results for input(s): TSH, T4TOTAL, FREET4, T3FREE, THYROIDAB in the last 72 hours. Anemia Panel: No results for input(s): VITAMINB12, FOLATE, FERRITIN, TIBC, IRON, RETICCTPCT in the last 72 hours. Sepsis Labs: No results for input(s): PROCALCITON, LATICACIDVEN in the last 168 hours.  Recent Results (from the past 240 hour(s))  Resp Panel by RT-PCR (Flu A&B, Covid) Nasopharyngeal Swab     Status: None   Collection Time: 04/23/20  7:25 PM   Specimen: Nasopharyngeal Swab; Nasopharyngeal(NP) swabs in vial transport medium  Result Value Ref Range Status   SARS Coronavirus 2 by RT PCR NEGATIVE NEGATIVE Final    Comment: (NOTE) SARS-CoV-2 target nucleic acids are NOT DETECTED.  The SARS-CoV-2 RNA is generally detectable in upper respiratory specimens during the acute phase of infection. The lowest concentration of SARS-CoV-2 viral copies this assay can detect is 138 copies/mL. A negative result does not preclude SARS-Cov-2 infection and should not be used as the sole basis for treatment or other patient management decisions. A negative result may occur with  improper specimen collection/handling, submission of specimen other than nasopharyngeal swab,  presence of viral mutation(s) within the areas targeted by this assay, and inadequate number of viral copies(<138 copies/mL). A negative result must be combined with clinical observations, patient history, and epidemiological information. The expected result is Negative.  Fact Sheet for Patients:  EntrepreneurPulse.com.au  Fact Sheet for Healthcare Providers:  IncredibleEmployment.be  This test is no t yet approved or cleared by the [place] FDA and  has been authorized for detection and/or diagnosis of SARS-CoV-2 by FDA under an Emergency Use Authorization (EUA). This EUA will remain  in effect (meaning this test can be used) for the duration of the COVID-19 declaration under Section 564(b)(1) of the Act, 21 U.S.C.section 360bbb-3(b)(1), unless the authorization is terminated  or revoked sooner.       Influenza A by PCR NEGATIVE NEGATIVE Final  Influenza B by PCR NEGATIVE NEGATIVE Final    Comment: (NOTE) The Xpert Xpress SARS-CoV-2/FLU/RSV plus assay is intended as an aid in the diagnosis of influenza from Nasopharyngeal swab specimens and should not be used as a sole basis for treatment. Nasal washings and aspirates are unacceptable for Xpert Xpress SARS-CoV-2/FLU/RSV testing.  Fact Sheet for Patients: EntrepreneurPulse.com.au  Fact Sheet for Healthcare Providers: IncredibleEmployment.be  This test is not yet approved or cleared by the [place] FDA and has been authorized for detection and/or diagnosis of SARS-CoV-2 by FDA under an Emergency Use Authorization (EUA). This EUA will remain in effect (meaning this test can be used) for the duration of the COVID-19 declaration under Section 564(b)(1) of the Act, 21 U.S.C. section 360bbb-3(b)(1), unless the authorization is terminated or revoked.  Performed at [facility], [place] [address], [place], [place] [address]           Radiology Studies: No results found.      Scheduled Meds: . amLODipine  10 mg Oral Daily  . aspirin EC  81 mg Oral Daily  . atorvastatin  40 mg Oral Daily  . carvedilol  12.5 mg Oral BID WC  . clopidogrel  75 mg Oral Daily  . enoxaparin (LOVENOX) injection  40 mg Subcutaneous Q24H  . insulin aspart  0-9 Units Subcutaneous TID WC  . nicotine  21 mg Transdermal Daily  . thiamine  100 mg Oral Daily   Or  . thiamine  100 mg Intravenous Daily   Continuous Infusions: . sodium chloride 100 mL/hr at 04/27/20 0521     LOS: 3 days    Time spent: 33 minutes spent on chart review, discussion with nursing staff, consultants, updating family and interview/physical exam; more than 50% of that time was spent in counseling and/or coordination of care.    Gregory Nunez J [place] DO [facility] Available via Epic secure chat 7am-7pm After these hours, please refer to coverage provider listed on [email or web address] 04/27/2020, 11:30 AM

## 2020-04-27 NOTE — TOC Progression Note (Addendum)
Transition of Care [facility]) - Progression Note    Patient Details  Name: Gregory Nunez. MRN: [id number] Date of Birth: 07-28-1945  Transition of Care [facility]) CM/SW Contact  Coralee [place], [place] Phone Number: 04/27/2020, 9:47 AM  Clinical Narrative:    CSW received confirmation that pt's nephew would like for pt to go to [facility]. Facility was updated and noted that it is on a day by day basis and SW will need to check in with them in the morning. CSW started insurance auth and requested covid test. SW will continue to follow for transition of care.  Auth approved: 11:27 am Everlene Balls ID: [id number] 1/9- 1/12 Jacqualyn Posey, case manager   Expected Discharge Plan: Skilled Nursing Facility Barriers to Discharge: SNF Pending bed offer,Insurance Authorization  Expected Discharge Plan and Services Expected Discharge Plan: [place] In-house Referral: Clinical Social Work Discharge Planning Services: CM Consult Post Acute Care Choice: [place] Living arrangements for the past 2 months: Apartment Expected Discharge Date: 04/25/20                                     Social Determinants of Health (SDOH) Interventions    Readmission Risk Interventions No flowsheet data found.

## 2020-04-28 DIAGNOSIS — I10 Essential (primary) hypertension: Secondary | ICD-10-CM | POA: Diagnosis not present

## 2020-04-28 LAB — GLUCOSE, CAPILLARY
Glucose-Capillary: 136 mg/dL — ABNORMAL HIGH (ref 70–99)
Glucose-Capillary: 156 mg/dL — ABNORMAL HIGH (ref 70–99)
Glucose-Capillary: 304 mg/dL — ABNORMAL HIGH (ref 70–99)
Glucose-Capillary: 267 mg/dL — ABNORMAL HIGH (ref 70–99)
Glucose-Capillary: 159 mg/dL — ABNORMAL HIGH (ref 70–99)

## 2020-04-28 MED ORDER — INSULIN ASPART 100 UNIT/ML ~~LOC~~ SOLN
0.0000 [IU] | Freq: Three times a day (TID) | SUBCUTANEOUS | Status: DC
  Administered 2020-04-28: 8 [IU] via SUBCUTANEOUS
  Administered 2020-04-29: 5 [IU] via SUBCUTANEOUS
  Administered 2020-04-29: 8 [IU] via SUBCUTANEOUS
  Administered 2020-04-30: 5 [IU] via SUBCUTANEOUS
  Administered 2020-04-30: 2 [IU] via SUBCUTANEOUS
  Administered 2020-04-30: 8 [IU] via SUBCUTANEOUS
  Administered 2020-05-01: 2 [IU] via SUBCUTANEOUS
  Administered 2020-05-01 (×2): 8 [IU] via SUBCUTANEOUS
  Administered 2020-05-02: 3 [IU] via SUBCUTANEOUS
  Administered 2020-05-02: 5 [IU] via SUBCUTANEOUS
  Administered 2020-05-02 – 2020-05-03 (×2): 2 [IU] via SUBCUTANEOUS
  Administered 2020-05-03: 5 [IU] via SUBCUTANEOUS
  Administered 2020-05-03: 8 [IU] via SUBCUTANEOUS
  Administered 2020-05-04: 2 [IU] via SUBCUTANEOUS
  Administered 2020-05-04: 5 [IU] via SUBCUTANEOUS
  Administered 2020-05-04 – 2020-05-05 (×3): 3 [IU] via SUBCUTANEOUS
  Administered 2020-05-05: 5 [IU] via SUBCUTANEOUS
  Administered 2020-05-06 (×3): 3 [IU] via SUBCUTANEOUS
  Administered 2020-05-07 (×2): 5 [IU] via SUBCUTANEOUS
  Administered 2020-05-08: 11 [IU] via SUBCUTANEOUS
  Administered 2020-05-08: 3 [IU] via SUBCUTANEOUS

## 2020-04-28 MED ORDER — CARVEDILOL 12.5 MG PO TABS
25.0000 mg | ORAL_TABLET | Freq: Two times a day (BID) | ORAL | Status: DC
  Administered 2020-04-28 – 2020-05-08 (×21): 25 mg via ORAL
  Filled 2020-04-28 (×5): qty 2
  Filled 2020-04-28: qty 4
  Filled 2020-04-28 (×15): qty 2

## 2020-04-28 NOTE — Progress Notes (Signed)
PROGRESS NOTE    Gregory Leon Hagood Jr.  MRN:[id number] DOB: 04/30/1945 DOA: 04/23/2020 PCP: Avbuere, Edwin, MD    Brief Narrative:  Gregory Leon Weatherall Jr. is a 74-year-old male with past medical history significant for type 2 diabetes mellitus with diabetic retinopathy, essential hypertension, hyperlipidemia, prostate cancer and alcohol use disorder who presented to the ED with right-sided weakness.  Patient was found wandering around a local [facility] and EMS was activated with notable right-sided facial droop and altered mental status.  Upon chart review, concern for excessive alcohol abuse from prior presentations.  Patient reports his last drink was roughly 12 days ago.  Does report chronic tobacco use of 1.5 packs/day.  In the ED, BP 190/94, HR 95, RR 15, SPO2 97% room air.  WBC 5.2, hemoglobin 11.6, platelets 196.  Sodium 137, potassium 3.5, CO2 19, BUN 38, creatinine 2.35, glucose 110.  AST 68, ALT 39, alk phos 45, total bilirubin 0.8.  SARS-CoV-2/influenza A/B PCR negative.  CT head without contrast with new hypodensity in left paramedian pons consistent with acute infarction and ill-defined hypodensity left lenticular nuclear suspicious for subacute infarct.  Neurology was consulted.  Patient received 325 mg aspirin and 1 L NS bolus in the ED.  Also received Librium and Ativan as needed per CIWA protocol for concern of EtOH withdrawal.  Hospitalist service consulted for further evaluation and management of acute CVA.   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  CT head without contrast notable for ischemic infarct left basal ganglia without hemorrhage/mass.  MR brain with 3 mm acute ischemic nonhemorrhagic infarct left basal ganglia and 5 mm subacute infarct right periatrial white matter.  MRA head/neck with no large vessel occlusion but with short segment moderate stenosis mid basilar artery, short segment moderate stenosis 3 frontal left V1 segment, patent carotid arteries.  Hemoglobin A1c 6.5.  Lipid panel with total cholesterol 141, HDL 38, LDL 82, and triglycerides 105.  TTE with LVEF 60-65%, G1DD, mild MR, no LV regional wall motion abnormalities, RV systolic function normal, IVC normal in size.  --Neurology following, appreciate assistance --PT/OT recommends SNF --SLP evaluation: Dysphagia 3 diet, mechanical soft with thin liquids, meds with pure --Aspirin 81 mg p.o. daily, Plavix 75 mg p.o. daily x 3 weeks  followed by aspirin alone --Started atorvastatin 40 mg p.o. daily --No arrhythmias noted on telemetry such far, offered loop recorder placement, patient declined  --Pending SNF placement; CIR declined 2/2 poor social support, accepted at [facility] but since has been declined due to apparent active liability claim through [employer] in the Medicare system  --dispo per SW  Acute Renal Failure Etiology likely prerenal from poor oral intake.  Received IV fluid bolus on admission. --Cr 2.35>2.20>1.66>1.51>1.49 (baseline 1.1) --continue NS at 100mL/hr --Avoid nephrotoxins, renally dose all medications --Repeat BMP in a.m.  Essential hypertension On amlodipine 10 mg at home.   --restarted amlodipine 10mg PO daily --started on coreg 25 mg BID --Continue to monitor BP closely   and slowly adjust to normal BP over next few days  Type 2 diabetes mellitus Hemoglobin A1c 6.5.  On Metformin 500 mg twice daily at home. --Sensitive insulin sliding scale for  coverage --CBGs before every meal/at bedtime  Hyperlipidemia Lipid panel with total cholesterol 141, HDL 38, LDL 82, and triglycerides 105. --Atorvastatin 40 mg p.o. daily  Tobacco use disorder Reports smoking 1.5 packs/day.  Counseled on need for complete cessation --Nicotine patch  EtOH use disorder --CIWAA protocol with symptom triggered Ativan --Thiamine, folic acid, multivitamin  Cognitive impairment Patient seen by Occupational Therapy, scored 2/10 on the medicog; which correlates to cognitive limitations that are required for safe completion of medication management and cooking.  Patient seen by psychiatry on 04/25/2020 and deemed to possess capacity for medical decision-making.  Etiology of his impairment likely confounded by his significant EtOH abuse disorder.   DVT prophylaxis: Lovenox Code Status: Full code Family Communication: Updated patient extensively at bedside  Disposition Plan:  Status is: Inpatient  Remains inpatient appropriate because:Ongoing diagnostic testing needed not appropriate for outpatient work up, Unsafe d/c plan, IV treatments appropriate due to intensity of illness or inability to take PO and Inpatient level of care appropriate due to severity of illness   Dispo: The patient is from: Home              Anticipated d/c is to: SNF              Anticipated d/c date is: 1 day              Patient currently is medically stable to d/c.   Consultants:   Neurology  Psychiatry  Procedures:   TTE  Antimicrobials:   None   Subjective: Patient seen and examined bedside, resting comfortably.  No complaints this morning.  Waiting for breakfast to arrive.  Was initially accepted at [facility], they have rescinded acceptance given that he has an open liability claim through [employer] in the Medicare system.  No other questions or concerns at this time. Denies headache, no fever/chills/night sweats, no nausea/vomiting/diarrhea, no chest pain, no  palpitations, no shortness of breath, no abdominal pain.  No acute events overnight per nursing staff.  Objective: Vitals:   04/28/20 0018 04/28/20 0430 04/28/20 0850 04/28/20 1000  BP: (!) 156/92 (!) 159/90 (!) 184/92 (!) 174/88  Pulse: 91 97 (!) 107   Resp: 18 20    Temp: 98.5 F (36.9 C) 98.1 F (36.7 C) 98.2 F (36.8 C)   TempSrc: Oral Oral Oral   SpO2: 99% 100% 100%   Weight:      Height:        Intake/Output Summary (Last 24 hours) at 04/28/2020 1150 Last data filed at 04/28/2020 1000 Gross per 24 hour  Intake 3371.67 ml  Output 1200 ml  Net 2171.67 ml   Filed Weights   04/23/20 1743  Weight: 84.4 kg    Examination:  General exam: Appears calm and comfortable  Respiratory system: Clear to auscultation. Respiratory effort normal.  Oxygenating well on room air Cardiovascular system: S1 & S2 heard, RRR. No JVD, murmurs, rubs, gallops or clicks. No pedal edema. Gastrointestinal system: Abdomen is nondistended, soft and nontender. No organomegaly or masses felt. Normal bowel sounds heard. Central nervous system: Alert and oriented to person/place/time.  Right-sided facial droop, otherwise no focal neurological deficits. Extremities: Symmetric 5 x 5 power. Skin: No rashes, lesions or ulcers Psychiatry: Judgement and insight appear poor. Mood & affect appropriate.       Data Reviewed: I have personally reviewed following labs and imaging studies  CBC: Recent Labs  Lab 04/23/20 1715 04/23/20 1723  WBC 5.2  --   NEUTROABS 3.5  --   HGB 11.6* 11.9*  HCT 35.7* 35.0*  MCV 95.2  --   PLT 196  --    Basic Metabolic Panel: Recent Labs  Lab 04/23/20 1715 04/23/20 1723 04/25/20 0209 04/26/20 0433 04/27/20 0232  NA 137 141 140 143 142  K 3.5 3.6 3.2* 3.9 3.6  CL 104 105 108 111 110  CO2 19*  --  28 21* 21*  GLUCOSE 110* 108* 110* 141* 123*  BUN 38* 38* 27* 23 26*  CREATININE 2.35* 2.20* 1.66* 1.51* 1.46*  CALCIUM 9.3  --  8.6* 9.2 9.0  MG  --   --  1.8  --    --    GFR: Estimated Creatinine Clearance: 48.7 mL/min (A) (by C-G formula based on SCr of 1.46 mg/dL (H)). Liver Function Tests: Recent Labs  Lab 04/23/20 1715  AST 68*  ALT 39  ALKPHOS 45  BILITOT 0.8  PROT 8.0  ALBUMIN 3.4*   No results for input(s): LIPASE, AMYLASE in the last 168 hours. No results for input(s): AMMONIA in the last 168 hours. Coagulation Profile: Recent Labs  Lab 04/23/20 1715  INR 1.0   Cardiac Enzymes: No results for input(s): CKTOTAL, CKMB, CKMBINDEX, TROPONINI in the last 168 hours. BNP (last 3 results) No results for input(s): PROBNP in the last 8760 hours. HbA1C: No results for input(s): HGBA1C in the last 72 hours. CBG: Recent Labs  Lab 04/27/20 1141 04/27/20 1550 04/27/20 2138 04/28/20 0637 04/28/20 1138  GLUCAP 194* 205* 134* 156* 304*   Lipid Profile: No results for input(s): CHOL, HDL, LDLCALC, TRIG, CHOLHDL, LDLDIRECT in the last 72 hours. Thyroid Function Tests: No results for input(s): TSH, T4TOTAL, FREET4, T3FREE, THYROIDAB in the last 72 hours. Anemia Panel: No results for input(s): VITAMINB12, FOLATE, FERRITIN, TIBC, IRON, RETICCTPCT in the last 72 hours. Sepsis Labs: No results for input(s): PROCALCITON, LATICACIDVEN in the last 168 hours.  Recent Results (from the past 240 hour(s))  Resp Panel by RT-PCR (Flu A&B, Covid) Nasopharyngeal Swab     Status: None   Collection Time: 04/23/20  7:25 PM   Specimen: Nasopharyngeal Swab; Nasopharyngeal(NP) swabs in vial transport medium  Result Value Ref Range Status   SARS Coronavirus 2 by RT PCR NEGATIVE NEGATIVE Final    Comment: (NOTE) SARS-CoV-2 target nucleic acids are NOT DETECTED.  The SARS-CoV-2 RNA is generally detectable in upper respiratory specimens during the acute phase of infection. The lowest concentration of SARS-CoV-2 viral copies this assay can detect is 138 copies/mL. A negative result does not preclude SARS-Cov-2 infection and should not be used as the sole  basis for treatment or other patient management decisions. A negative result may occur with  improper specimen collection/handling, submission of specimen other than nasopharyngeal swab, presence of viral mutation(s) within the areas targeted by this assay, and inadequate number of viral copies(<138 copies/mL). A negative result must be combined with clinical observations, patient history, and epidemiological information. The expected result is Negative.  Fact Sheet for Patients:  EntrepreneurPulse.com.au  Fact Sheet for Healthcare Providers:  IncredibleEmployment.be  This test is no t yet approved or cleared by the [place] FDA and  has been authorized for detection and/or diagnosis of SARS-CoV-2 by FDA under an Emergency Use Authorization (EUA). This EUA will remain  in effect (meaning this test can  be used) for the duration of the COVID-19 declaration under Section 564(b)(1) of the Act, 21 U.S.C.section 360bbb-3(b)(1), unless the authorization is terminated  or revoked sooner.       Influenza A by PCR NEGATIVE NEGATIVE Final   Influenza B by PCR NEGATIVE NEGATIVE Final    Comment: (NOTE) The Xpert Xpress SARS-CoV-2/FLU/RSV plus assay is intended as an aid in the diagnosis of influenza from Nasopharyngeal swab specimens and should not be used as a sole basis for treatment. Nasal washings and aspirates are unacceptable for Xpert Xpress SARS-CoV-2/FLU/RSV testing.  Fact Sheet for Patients: https://www.fda.gov/media/152166/download  Fact Sheet for Healthcare Providers: https://www.fda.gov/media/152162/download  This test is not yet approved or cleared by the [place] FDA and has been authorized for detection and/or diagnosis of SARS-CoV-2 by FDA under an Emergency Use Authorization (EUA). This EUA will remain in effect (meaning this test can be used) for the duration of the COVID-19 declaration under Section 564(b)(1) of the Act,  21 U.S.C. section 360bbb-3(b)(1), unless the authorization is terminated or revoked.  Performed at [facility], [address] [place], [place] [address]   SARS CORONAVIRUS 2 (TAT 6-24 HRS) Nasopharyngeal Nasopharyngeal Swab     Status: None   Collection Time: 04/27/20 11:58 AM   Specimen: Nasopharyngeal Swab  Result Value Ref Range Status   SARS Coronavirus 2 NEGATIVE NEGATIVE Final    Comment: (NOTE) SARS-CoV-2 target nucleic acids are NOT DETECTED.  The SARS-CoV-2 RNA is generally detectable in upper and lower respiratory specimens during the acute phase of infection. Negative results do not preclude SARS-CoV-2 infection, do not rule out co-infections with other pathogens, and should not be used as the sole basis for treatment or other patient management decisions. Negative results must be combined with clinical observations, patient history, and epidemiological information. The expected result is Negative.  Fact Sheet for Patients: https://www.fda.gov/media/138098/download  Fact Sheet for Healthcare Providers: https://www.fda.gov/media/138095/download  This test is not yet approved or cleared by the [place] FDA and  has been authorized for detection and/or diagnosis of SARS-CoV-2 by FDA under an Emergency Use Authorization (EUA). This EUA will remain  in effect (meaning this test can be used) for the duration of the COVID-19 declaration under Se ction 564(b)(1) of the Act, 21 U.S.C. section 360bbb-3(b)(1), unless the authorization is terminated or revoked sooner.  Performed at [facility], [address] [place], [place] [address]          Radiology Studies: No results found.      Scheduled Meds: . amLODipine  10 mg Oral Daily  . aspirin EC  81 mg Oral Daily  . atorvastatin  40 mg Oral Daily  . carvedilol  25 mg Oral BID WC  . clopidogrel  75 mg Oral Daily  . enoxaparin (LOVENOX) injection  40 mg Subcutaneous Q24H  . insulin  aspart  0-9 Units Subcutaneous TID WC  . nicotine  21 mg Transdermal Daily  . thiamine  100 mg Oral Daily   Or  . thiamine  100 mg Intravenous Daily   Continuous Infusions: . sodium chloride 100 mL/hr at 04/28/20 0601     LOS: 4 days    Time spent: 33 minutes spent on chart review, discussion with nursing staff, consultants, updating family and interview/physical exam; more than 50% of that time was spent in counseling and/or coordination of care.     J , DO [facility] Available via Epic secure chat 7am-7pm After these hours, please refer to coverage   provider listed on [email or web address] 04/28/2020, 11:50 AM

## 2020-04-28 NOTE — Progress Notes (Signed)
Inpatient Diabetes Program Recommendations  AACE/ADA: New Consensus Statement on Inpatient Glycemic Control (2015)  Target Ranges:  Prepandial:   less than 140 mg/dL      Peak postprandial:   less than 180 mg/dL (1-2 hours)      Critically ill patients:  140 - 180 mg/dL   Results for DELROY, ORDWAY (MRN [id number]) as of 04/28/2020 15:30  Ref. Range 04/28/2020 06:37 04/28/2020 11:38  Glucose-Capillary Latest Ref Range: 70 - 99 mg/dL 156 (H)  2 units NOVOLOG  304 (H)  7 units NOVOLOG    Admit with: Acute CVA  History: DM  Home DM Meds: Metformin 500 mg BID  Current Orders: Novolog Sensitive Correction Scale/ SSI (0-9 units) TID AC     MD- Note poor PO intake per documentation.  However, CBG 304 at 11:30am today.  May consider increasing the Novolog SSi to the Moderate Scale (0-15 units)     --Will follow patient during hospitalization--  Wyn Quaker RN, MSN, CDE Diabetes Coordinator Inpatient Glycemic Control Team Team Pager: [phone] (8a-5p)

## 2020-04-28 NOTE — Progress Notes (Signed)
Physical Therapy Treatment Patient Details Name: Gregory Nunez. MRN: [id number] DOB: November 19, 1945 Today's Date: 04/28/2020    History of Present Illness Pt is a 75 year old male with a medical hx significant for dyspnea, alcohol use, prostate cancer, pancreatitis, HTN, HAV, DM2, and depression who presented with R-sided weakness, R-sided facial droop, and AMS when found wandering around a local [facility]S. 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 They also showed superimposed remote lacunar infarcts involving the L paramedian pons and posterior R frontoparietal corona radiata. NIHSS of 3 and tPA not administered    PT Comments    Pt progressing towards physical therapy goals.  Pt requires increased cues for command following at times. He had increased difficulty taking steps this session and pt reports feeling fatigued. Pt left with NT present and all needs met in recliner chair with chair alarm set. SNF level rehab recommendation remains appropriate at d/c. Will continue to follow and progress as able per POC.   Follow Up Recommendations  SNF; Supervision/Assistance - 24 hour     Equipment Recommendations  Rolling walker with 5" wheels;3in1 (PT)    Recommendations for Other Services       Precautions / Restrictions Precautions Precautions: Fall Precaution Comments: monitor BP (HTN and orthostatic hypotension) Restrictions Weight Bearing Restrictions: No    Mobility  Bed Mobility Overal bed mobility: Needs Assistance Bed Mobility: Supine to Sit     Supine to sit: Min guard;HOB elevated Sit to supine: Min guard   General bed mobility comments: HOB elevated and pt requiring increased time to transition around to EOB. Multimodal cues to complete transfer out to EOB to get feet on the floor. VC's for scooting R hip out more and pt looking at LLE and  scooting L hip out instead.  Transfers Overall transfer level: Needs assistance Equipment used: 1 person hand held assist Transfers: Sit to/from Stand Sit to Stand: Mod assist Stand pivot transfers: Mod assist       General transfer comment: VC's for hand placement on seated surface for safety. Pt required heavy mod assist to power-up to full stand. Pt was able to perform SPT from bed to chair with mod assist, however pt with increased difficulty advancing LE's around to fully line up hips with chair. Uncontrolled descent to sit.  Ambulation/Gait Ambulation/Gait assistance: Mod assist Gait Distance (Feet): 2 Feet Assistive device: Rolling walker (2 wheeled) Gait Pattern/deviations: Decreased weight shift to left;Shuffle;Step-to pattern;Trunk flexed Gait velocity: Decreased Gait velocity interpretation: <1.31 ft/sec, indicative of household ambulator General Gait Details: Pt unable to take steps away from the bed, so lateral shuffling steps performed at EOB instead. Posterior lean and eventual LOB back to sit on EOB.   Stairs             Wheelchair Mobility    Modified Rankin (Stroke Patients Only) Modified Rankin (Stroke Patients Only) Pre-Morbid Rankin Score: No symptoms Modified Rankin: Moderately severe disability     Balance Overall balance assessment: Needs assistance Sitting-balance support: Feet supported;No upper extremity supported Sitting balance-Leahy Scale: Fair Sitting balance - Comments: no UE support, min gaurd for safety sitting statically EOB. Postural control: Posterior lean Standing balance support: Bilateral upper extremity supported;During functional activity Standing balance-Leahy Scale: Poor Standing balance comment: Reliant on UE support and external support for balance, with LOB bouts and assistance to recover.  Cognition Arousal/Alertness: Awake/alert Behavior During Therapy: WFL for tasks  assessed/performed Overall Cognitive Status: Impaired/Different from baseline Area of Impairment: Attention;Following commands;Safety/judgement;Problem solving                   Current Attention Level: Sustained Memory: Decreased short-term memory Following Commands: Follows one step commands consistently;Follows one step commands with increased time;Follows multi-step commands inconsistently Safety/Judgement: Decreased awareness of safety;Decreased awareness of deficits Awareness: Emergent Problem Solving: Slow processing;Difficulty sequencing;Requires verbal cues        Exercises General Exercises - Lower Extremity Ankle Circles/Pumps: Strengthening;Both;10 reps;Supine Straight Leg Raises: 10 reps    General Comments        Pertinent Vitals/Pain Pain Assessment: Faces Faces Pain Scale: No hurt Pain Intervention(s): Monitored during session    Home Living                      Prior Function            PT Goals (current goals can now be found in the care plan section) Acute Rehab PT Goals Patient Stated Goal: to go home PT Goal Formulation: With patient Time For Goal Achievement: 05/08/20 Potential to Achieve Goals: Good Progress towards PT goals: Progressing toward goals    Frequency    Min 4X/week      PT Plan Current plan remains appropriate    Co-evaluation              AM-PAC PT "6 Clicks" Mobility   Outcome Measure  Help needed turning from your back to your side while in a flat bed without using bedrails?: A Little Help needed moving from lying on your back to sitting on the side of a flat bed without using bedrails?: A Little Help needed moving to and from a bed to a chair (including a wheelchair)?: A Lot Help needed standing up from a chair using your arms (e.g., wheelchair or bedside chair)?: A Lot Help needed to walk in hospital room?: A Lot Help needed climbing 3-5 steps with a railing? : Total 6 Click Score: 13     End of Session Equipment Utilized During Treatment: Gait belt Activity Tolerance: Patient limited by fatigue Patient left: in chair;with call bell/phone within reach;with chair alarm set Nurse Communication: Mobility status (NT present at end of session) PT Visit Diagnosis: Unsteadiness on feet (R26.81);Other symptoms and signs involving the nervous system (G89.169)     Time: 4503-8882 PT Time Calculation (min) (ACUTE ONLY): 22 min  Charges:  $Gait Training: 8-22 mins                     Gregory Nunez, PT, DPT Acute Rehabilitation Services Pager: [phone] Office: [phone]    Gregory Nunez 04/28/2020, 12:00 PM

## 2020-04-28 NOTE — TOC Progression Note (Signed)
Transition of Care [facility]) - Progression Note    Patient Details  Name: Gregory Nunez. MRN: [id number] Date of Birth: 1945/05/18  Transition of Care [facility]) CM/SW [place], [place] Phone Number: 04/28/2020, 11:34 AM  Clinical Narrative:   CSW reached out to [facility] to ask about bed availability, and they found that patient has an active liability claim through [place] in the Medicare system, so they are not able to offer until that is closed. CSW attempted to reach patient's nephew to discuss, and left a voicemail. Patient has no other bed offers at this time. CSW to follow.    Expected Discharge Plan: Skilled Nursing Facility Barriers to Discharge: SNF Pending bed offer,Insurance Authorization  Expected Discharge Plan and Services Expected Discharge Plan: [place] In-house Referral: Clinical Social Work Discharge Planning Services: CM Consult Post Acute Care Choice: [place] Living arrangements for the past 2 months: Apartment Expected Discharge Date: 04/25/20                                     Social Determinants of Health (SDOH) Interventions    Readmission Risk Interventions No flowsheet data found.

## 2020-04-28 NOTE — Plan of Care (Signed)
  Problem: Education: Goal: Knowledge of disease or condition will improve Outcome: Progressing Goal: Knowledge of secondary prevention will improve Outcome: Progressing Goal: Knowledge of patient specific risk factors addressed and post discharge goals established will improve Outcome: Progressing   Problem: Education: Goal: Knowledge of General Education information will improve Description: Including pain rating scale, medication(s)/side effects and non-pharmacologic comfort measures Outcome: Progressing   Problem: Health Behavior/Discharge Planning: Goal: Ability to manage health-related needs will improve Outcome: Progressing   Problem: Clinical Measurements: Goal: Ability to maintain clinical measurements within normal limits will improve Outcome: Progressing Goal: Will remain free from infection Outcome: Progressing Goal: Diagnostic test results will improve Outcome: Progressing Goal: Respiratory complications will improve Outcome: Progressing Goal: Cardiovascular complication will be avoided Outcome: Progressing   Problem: Activity: Goal: Risk for activity intolerance will decrease Outcome: Progressing   Problem: Nutrition: Goal: Adequate nutrition will be maintained Outcome: Progressing   Problem: Coping: Goal: Level of anxiety will decrease Outcome: Progressing   Problem: Elimination: Goal: Will not experience complications related to bowel motility Outcome: Progressing Goal: Will not experience complications related to urinary retention Outcome: Progressing   Problem: Pain Managment: Goal: General experience of comfort will improve Outcome: Progressing   Problem: Safety: Goal: Ability to remain free from injury will improve Outcome: Progressing   Problem: Skin Integrity: Goal: Risk for impaired skin integrity will decrease Outcome: Progressing

## 2020-04-29 DIAGNOSIS — I10 Essential (primary) hypertension: Secondary | ICD-10-CM | POA: Diagnosis not present

## 2020-04-29 LAB — BASIC METABOLIC PANEL
Anion gap: 12 (ref 5–15)
BUN: 30 mg/dL — ABNORMAL HIGH (ref 8–23)
CO2: 18 mmol/L — ABNORMAL LOW (ref 22–32)
Calcium: 8.9 mg/dL (ref 8.9–10.3)
Chloride: 108 mmol/L (ref 98–111)
Creatinine, Ser: 1.41 mg/dL — ABNORMAL HIGH (ref 0.61–1.24)
GFR, Estimated: 53 mL/min — ABNORMAL LOW (ref >60)
Glucose, Bld: 147 mg/dL — ABNORMAL HIGH (ref 70–99)
Potassium: 3.6 mmol/L (ref 3.5–5.1)
Sodium: 138 mmol/L (ref 135–145)

## 2020-04-29 LAB — GLUCOSE, CAPILLARY
Glucose-Capillary: 282 mg/dL — ABNORMAL HIGH (ref 70–99)
Glucose-Capillary: 211 mg/dL — ABNORMAL HIGH (ref 70–99)
Glucose-Capillary: 200 mg/dL — ABNORMAL HIGH (ref 70–99)

## 2020-04-29 NOTE — Progress Notes (Signed)
PROGRESS NOTE    Gregory Nunez Gregory Nunez.  AOZ:[id number] DOB: October 05, 1945 DOA: 04/23/2020 PCP: [name], MD    Brief Narrative:  Gregory Nunez. is a 75 year old male with past medical history significant for type 2 diabetes mellitus with diabetic retinopathy, essential hypertension, hyperlipidemia, prostate cancer and alcohol use disorder who presented to the ED with right-sided weakness.  [facility] was [facility] a [facility] and EMS was activated with notable right-sided facial droop and altered mental status.  Upon chart review, concern for excessive alcohol abuse from prior presentations.  Patient reports his last drink was roughly 12 days ago.  Does report chronic tobacco use of 1.5 packs/day.  In the ED, BP 190/94, HR 95, RR 15, SPO2 97% room air.  WBC 5.2, hemoglobin 11.6, platelets 196.  Sodium 137, potassium 3.5, CO2 19, BUN 38, creatinine 2.35, glucose 110.  AST 68, ALT 39, alk phos 45, total bilirubin 0.8.  SARS-CoV-2/influenza A/B PCR negative.  CT head without contrast with new hypodensity in left paramedian pons consistent with acute infarction and ill-defined hypodensity left lenticular nuclear suspicious for subacute infarct.  Neurology was consulted.  Patient received 325 mg aspirin and 1 L NS bolus in the ED.  Also received Librium and Ativan as needed per CIWA protocol for concern of EtOH withdrawal.  Hospitalist service consulted for further evaluation and management of acute CVA.   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  CT head without contrast notable for ischemic infarct left basal ganglia without hemorrhage/mass.  MR brain with 3 mm acute ischemic nonhemorrhagic infarct left basal ganglia and 5 mm subacute infarct right periatrial white matter.  MRA head/neck with no large vessel occlusion but with short segment moderate stenosis mid basilar artery, short segment moderate stenosis 3 frontal left V1 segment, patent carotid arteries.  Hemoglobin A1c 6.5.  Lipid panel with total cholesterol 141, HDL 38, LDL 82, and triglycerides 105.  TTE with LVEF 60-65%, G1DD, mild MR, no LV regional wall motion abnormalities, RV systolic function normal, IVC normal in size.  --Neurology following, appreciate assistance --PT/OT recommends SNF --SLP evaluation: Dysphagia 3 diet, mechanical soft with thin liquids, meds with pure --Aspirin 81 mg p.o. daily, Plavix 75 mg p.o. daily x 3 weeks  followed by aspirin alone --Started atorvastatin 40 mg p.o. daily --No arrhythmias noted on telemetry such far, offered loop recorder placement, patient declined  --Pending SNF placement; CIR declined 2/2 poor social support, accepted at [facility] but since has been declined due to apparent active liability claim through [place] in the Medicare system  --dispo per SW  Acute Renal Failure Etiology likely prerenal from poor oral intake.  Received IV fluid bolus on admission. --Cr 2.35>2.20>1.66>1.51>1.49>1.41 (baseline 1.1) --Avoid nephrotoxins, renally dose all medications --Repeat BMP in a.m.  Essential hypertension On amlodipine 10 mg at home.   --BP 165/88 this am --restarted amlodipine 71m PO daily --started on coreg 25 mg BID --Continue to monitor BP closely  and slowly adjust to normal BP over next few days  Type 2 diabetes mellitus Hemoglobin A1c 6.5.  On Metformin 500 mg twice daily at home. --moderate insulin sliding scale for  coverage --CBGs before every meal/at bedtime  Hyperlipidemia Lipid panel with total cholesterol 141, HDL 38, LDL 82, and triglycerides 105. --Atorvastatin 40 mg p.o. daily  Tobacco use disorder Reports smoking 1.5 packs/day.  Counseled on need for complete cessation --Nicotine patch  EtOH use disorder --CIWAA protocol with symptom triggered Ativan --Thiamine, folic acid, multivitamin  Cognitive impairment Patient seen by Occupational Therapy, scored 2/10 on the medicog; which correlates to cognitive limitations that are required for safe completion of medication management and cooking.  Patient seen by psychiatry on 04/25/2020 and deemed to possess capacity for medical decision-making.  Etiology of his impairment likely confounded by his significant EtOH abuse disorder.   DVT prophylaxis: Lovenox Code Status: Full code Family Communication: Updated patient extensively at bedside  Disposition Plan:  Status is: Inpatient  Remains inpatient appropriate because:Ongoing diagnostic testing needed not appropriate for outpatient work up, Unsafe d/c plan, IV treatments appropriate due to intensity of illness or inability to take PO and Inpatient level of care appropriate due to severity of illness   Dispo: The patient is from: Home              Anticipated d/c is to: SNF              Anticipated d/c date is: 1 day              Patient currently is medically stable to d/c.   Consultants:   Neurology  Psychiatry  Procedures:   TTE  Antimicrobials:   None   Subjective: Patient seen and examined bedside, resting comfortably.  No complaints this morning.  No other questions or concerns at this time. Awaiting SNF placement. Denies headache, no fever/chills/night sweats, no nausea/vomiting/diarrhea, no chest pain, no palpitations, no shortness of breath, no abdominal pain.  No acute events overnight per nursing staff.  Objective: Vitals:   04/28/20 1952 04/28/20 2333 04/29/20  0334 04/29/20 0753  BP: 137/75 (!) 159/79 (!) 165/88 (!) 151/73  Pulse: 74 77 93 82  Resp: _0 Temp: 98.3 F (36.8 C) 98 F (36.7 C) 98.1 F (36.7 C) 97.8 F (36.6 C)  TempSrc: Oral Oral Oral Oral  SpO2: 100% 99% 100% 99%  Weight:      Height:        Intake/Output Summary (Last 24 hours) at 04/29/2020 1143 Last data filed at 04/28/2020 1900 Gross per 24 hour  Intake 1140 ml  Output 750 ml  Net 390 ml   Filed Weights   04/23/20 1743  Weight: 84.4 kg    Examination:  General exam: Appears calm and comfortable  Respiratory system: Clear to auscultation. Respiratory effort normal.  Oxygenating well on room air Cardiovascular system: S1 & S2 heard, RRR. No JVD, murmurs, rubs, gallops or clicks. No pedal edema. Gastrointestinal system: Abdomen is nondistended, soft and nontender. No organomegaly or masses felt. Normal bowel sounds heard. Central nervous system: Alert and oriented to person/place but not time ("I don't keep up with that").  Right-sided facial droop, otherwise no focal neurological deficits. Extremities: Symmetric 5 x 5 power. Skin: No rashes, lesions or ulcers Psychiatry: Judgement and insight appear poor. Mood & affect appropriate.     Data Reviewed: I have personally reviewed following labs and imaging studies  CBC: Recent Labs  Lab 04/23/20  1715 04/23/20 1723  WBC 5.2  --   NEUTROABS 3.5  --   HGB 11.6* 11.9*  HCT 35.7* 35.0*  MCV 95.2  --   PLT 196  --    Basic Metabolic Panel: Recent Labs  Lab 04/23/20 1715 04/23/20 1723 04/25/20 0209 04/26/20 0433 04/27/20 0232 04/29/20 0456  NA 137 141 140 143 142 138  K 3.5 3.6 3.2* 3.9 3.6 3.6  CL 104 105 108 111 110 108  CO2 19*  --  28 21* 21* 18*  GLUCOSE 110* 108* 110* 141* 123* 147*  BUN 38* 38* 27* 23 26* 30*  CREATININE 2.35* 2.20* 1.66* 1.51* 1.46* 1.41*  CALCIUM 9.3  --  8.6* 9.2 9.0 8.9  MG  --   --  1.8  --   --   --    GFR: Estimated Creatinine Clearance: 50.4 mL/min (A)  (by C-G formula based on SCr of 1.41 mg/dL (H)). Liver Function Tests: Recent Labs  Lab 04/23/20 1715  AST 68*  ALT 39  ALKPHOS 45  BILITOT 0.8  PROT 8.0  ALBUMIN 3.4*   No results for input(s): LIPASE, AMYLASE in the last 168 hours. No results for input(s): AMMONIA in the last 168 hours. Coagulation Profile: Recent Labs  Lab 04/23/20 1715  INR 1.0   Cardiac Enzymes: No results for input(s): CKTOTAL, CKMB, CKMBINDEX, TROPONINI in the last 168 hours. BNP (last 3 results) No results for input(s): PROBNP in the last 8760 hours. HbA1C: No results for input(s): HGBA1C in the last 72 hours. CBG: Recent Labs  Lab 04/27/20 2138 04/28/20 0637 04/28/20 1138 04/28/20 1633 04/28/20 2148  GLUCAP 134* 156* 304* 267* 159*   Lipid Profile: No results for input(s): CHOL, HDL, LDLCALC, TRIG, CHOLHDL, LDLDIRECT in the last 72 hours. Thyroid Function Tests: No results for input(s): TSH, T4TOTAL, FREET4, T3FREE, THYROIDAB in the last 72 hours. Anemia Panel: No results for input(s): VITAMINB12, FOLATE, FERRITIN, TIBC, IRON, RETICCTPCT in the last 72 hours. Sepsis Labs: No results for input(s): PROCALCITON, LATICACIDVEN in the last 168 hours.  Recent Results (from the past 240 hour(s))  Resp Panel by RT-PCR (Flu A&B, Covid) Nasopharyngeal Swab     Status: None   Collection Time: 04/23/20  7:25 PM   Specimen: Nasopharyngeal Swab; Nasopharyngeal(NP) swabs in vial transport medium  Result Value Ref Range Status   SARS Coronavirus 2 by RT PCR NEGATIVE NEGATIVE Final    Comment: (NOTE) SARS-CoV-2 target nucleic acids are NOT DETECTED.  The SARS-CoV-2 RNA is generally detectable in upper respiratory specimens during the acute phase of infection. The lowest concentration of SARS-CoV-2 viral copies this assay can detect is 138 copies/mL. A negative result does not preclude SARS-Cov-2 infection and should not be used as the sole basis for treatment or other patient management decisions. A  negative result may occur with  improper specimen collection/handling, submission of specimen other than nasopharyngeal swab, presence of viral mutation(s) within the areas targeted by this assay, and inadequate number of viral copies(<138 copies/mL). A negative result must be combined with clinical observations, patient history, and epidemiological information. The expected result is Negative.  Fact Sheet for Patients:  EntrepreneurPulse.com.au  Fact Sheet for Healthcare Providers:  IncredibleEmployment.be  This test is no t yet approved or cleared by the [place] FDA and  has been authorized for detection and/or diagnosis of SARS-CoV-2 by FDA under an Emergency Use Authorization (EUA). This EUA will remain  in effect (meaning this test can be used) for the duration  of the COVID-19 declaration under Section 564(b)(1) of the Act, 21 U.S.C.section 360bbb-3(b)(1), unless the authorization is terminated  or revoked sooner.       Influenza A by PCR NEGATIVE NEGATIVE Final   Influenza B by PCR NEGATIVE NEGATIVE Final    Comment: (NOTE) The Xpert Xpress SARS-CoV-2/FLU/RSV plus assay is intended as an aid in the diagnosis of influenza from Nasopharyngeal swab specimens and should not be used as a sole basis for treatment. Nasal washings and aspirates are unacceptable for Xpert Xpress SARS-CoV-2/FLU/RSV testing.  Fact Sheet for Patients: EntrepreneurPulse.com.au  Fact Sheet for Healthcare Providers: IncredibleEmployment.be  This test is not yet approved or cleared by the [place] FDA and has been authorized for detection and/or diagnosis of SARS-CoV-2 by FDA under an Emergency Use Authorization (EUA). This EUA will remain in effect (meaning this test can be used) for the duration of the COVID-19 declaration under Section 564(b)(1) of the Act, 21 U.S.C. section 360bbb-3(b)(1), unless the authorization  is terminated or revoked.  Performed at [facility], [place] [address], [place], [place] [address]   SARS CORONAVIRUS 2 (TAT 6-24 HRS) Nasopharyngeal Nasopharyngeal Swab     Status: None   Collection Time: 04/27/20 11:58 AM   Specimen: Nasopharyngeal Swab  Result Value Ref Range Status   SARS Coronavirus 2 NEGATIVE NEGATIVE Final    Comment: (NOTE) SARS-CoV-2 target nucleic acids are NOT DETECTED.  The SARS-CoV-2 RNA is generally detectable in upper and lower respiratory specimens during the acute phase of infection. Negative results do not preclude SARS-CoV-2 infection, do not rule out co-infections with other pathogens, and should not be used as the sole basis for treatment or other patient management decisions. Negative results must be combined with clinical observations, patient history, and epidemiological information. The expected result is Negative.  Fact Sheet for Patients: SugarRoll.be  Fact Sheet for Healthcare Providers: https://www.woods-mathews.com/  This test is not yet approved or cleared by the [place] FDA and  has been authorized for detection and/or diagnosis of SARS-CoV-2 by FDA under an Emergency Use Authorization (EUA). This EUA will remain  in effect (meaning this test can be used) for the duration of the COVID-19 declaration under Se ction 564(b)(1) of the Act, 21 U.S.C. section 360bbb-3(b)(1), unless the authorization is terminated or revoked sooner.  Performed at [facility], [place] [address], [place], [place] [address]          Radiology Studies: No results found.      Scheduled Meds: . amLODipine  10 mg Oral Daily  . aspirin EC  81 mg Oral Daily  . atorvastatin  40 mg Oral Daily  . carvedilol  25 mg Oral BID WC  . clopidogrel  75 mg Oral Daily  . enoxaparin (LOVENOX) injection  40 mg Subcutaneous Q24H  . insulin aspart  0-15 Units Subcutaneous TID WC  . nicotine  21 mg  Transdermal Daily  . thiamine  100 mg Oral Daily   Or  . thiamine  100 mg Intravenous Daily   Continuous Infusions:    LOS: 5 days    Time spent: 32 minutes spent on chart review, discussion with nursing staff, consultants, updating family and interview/physical exam; more than 50% of that time was spent in counseling and/or coordination of care.    Elly Haffey J [place] DO [facility] Available via Epic secure chat 7am-7pm After these hours, please refer to coverage provider listed on [email or web address] 04/29/2020, 11:43 AM

## 2020-04-29 NOTE — Progress Notes (Signed)
Occupational Therapy Treatment Patient Details Name: Gregory Nunez. MRN: [id number] DOB: 26-Feb-1946 Today's Date: 04/29/2020    History of present illness Pt is a 75 year old male with a medical hx significant for dyspnea, alcohol use, prostate cancer, pancreatitis, HTN, HAV, DM2, and depression who presented with R-sided weakness, R-sided facial droop, and AMS when found wandering around a local [facility]. 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 They also showed superimposed remote lacunar infarcts involving the L paramedian pons and posterior R frontoparietal corona radiata. NIHSS of 3 and tPA not administered   OT comments  Pt agreeable to participate in therapy and get OOB for lunch. Pt continues to present with decreased strength and coordination at RUE/RLE. Facilitating weight bearing through RUE with support at elbow and wrist/hand. Pt requiring Min A -Mod A +2 for stand pivot to recliner. Mod A and built up handle for self feeding lunch with LUE; facilitating smooth, functional movement patterns to/from mouth. Continue to recommend dc to CIR and will continue to follow acutely as admitted.    Follow Up Recommendations  CIR    Equipment Recommendations  3 in 1 bedside commode    Recommendations for Other Services Rehab consult    Precautions / Restrictions Precautions Precautions: Fall Precaution Comments: monitor BP (HTN and orthostatic hypotension) Restrictions Weight Bearing Restrictions: No       Mobility Bed Mobility Overal bed mobility: Needs Assistance Bed Mobility: Supine to Sit     Supine to sit: Min guard;HOB elevated     General bed mobility comments: HOB elevated and pt requiring increased time to transition around to EOB. Multimodal cues to complete transfer out to EOB to get feet on the floor. VC's for scooting R hip out  more and pt looking at LLE and scooting L hip out instead.  Transfers Overall transfer level: Needs assistance Equipment used: 1 person hand held assist;Rolling walker (2 wheeled) Transfers: Sit to/from [facility] Sit to Stand: Min assist;Mod assist;+2 safety/equipment;+2 physical assistance Stand pivot transfers: Min assist;Mod assist;+2 safety/equipment;+2 physical assistance       General transfer comment: pt needed support R knee and R elbow in standing and with transfer to collapse on R side with trunk rotation. Had difficulty stepping R foot. Mod A to control descent to chair.    Balance Overall balance assessment: Needs assistance Sitting-balance support: Feet supported;No upper extremity supported Sitting balance-Leahy Scale: Fair Sitting balance - Comments: no UE support, min gaurd for safety sitting statically EOB. Postural control: Posterior lean Standing balance support: Bilateral upper extremity supported;During functional activity Standing balance-Leahy Scale: Poor Standing balance comment: Reliant on UE support and external support for balance, with LOB bouts and assistance to recover.                           ADL either performed or assessed with clinical judgement   ADL Overall ADL's : Needs assistance/impaired Eating/Feeding: Moderate assistance;Sitting;Set up;Supervision/ safety Eating/Feeding Details (indicate cue type and reason): Pt with tremors when bringing cup to mouth. Requiring signifciatn time and effort. Benefiting from [place] for spooning food with fork and bringing to mouth. When suing LUE, pt abel to bring fork to mouth while also bending forward towards fork; requiring signficant time and effort  Toilet Transfer: Minimal assistance;Moderate assistance;+2 for physical assistance;Stand-pivot (stand pivot to recliner) Toilet Transfer Details (indicate cue type and reason): pt needed support R knee and  R elbow in standing and with transfer to collapse on R side with trunk rotation. Had difficulty stepping R foot. Mod A to control descent to chair.         Functional mobility during ADLs: Minimal assistance;Moderate assistance;+2 for physical assistance General ADL Comments: Facilitating weight bearing through [place].     Vision       Perception     Praxis      Cognition Arousal/Alertness: Awake/alert Behavior During Therapy: WFL for tasks assessed/performed Overall Cognitive Status: Impaired/Different from baseline Area of Impairment: Attention;Following commands;Safety/judgement;Problem solving                 Orientation Level: Disoriented to;Time Current Attention Level: Sustained Memory: Decreased short-term memory Following Commands: Follows one step commands consistently;Follows one step commands with increased time;Follows multi-step commands inconsistently Safety/Judgement: Decreased awareness of safety;Decreased awareness of deficits Awareness: Emergent Problem Solving: Slow processing;Difficulty sequencing;Requires verbal cues General Comments: Benefits from direct, simpel cues in a calm environement        Exercises     Shoulder Instructions       General Comments No dizziness noted. Pt engaged in session. Had dentures, fork, and some remaining food in bed upon PT/OT arrival    Pertinent Vitals/ Pain       Pain Assessment: Faces Faces Pain Scale: No hurt Pain Intervention(s): Monitored during session  Home Living                                          Prior Functioning/Environment              Frequency  Min 2X/week        Progress Toward Goals  OT Goals(current goals can now be found in the care plan section)  Progress towards OT goals: Progressing toward goals  Acute Rehab OT Goals Patient Stated Goal: to go home OT Goal Formulation: With patient Time For Goal Achievement: 05/08/20 Potential to Achieve  Goals: Good ADL Goals Pt Will Perform Grooming: with modified independence;standing Pt Will Perform Upper Body Dressing: with modified independence;sitting Pt Will Perform Lower Body Dressing: with modified independence;sit to/from stand Pt Will Transfer to Toilet: with supervision;bedside commode;ambulating Pt Will Perform Toileting - Clothing Manipulation and hygiene: with supervision;sit to/from stand Pt Will Perform Tub/Shower Transfer: Shower transfer;3 in 1;grab bars;rolling walker Additional ADL Goal #1: Patient will complete RUE NMR HEP with use of written handout in prep for ADLs.  Plan Discharge plan remains appropriate    Co-evaluation    PT/OT/SLP Co-Evaluation/Treatment: Yes Reason for Co-Treatment: For patient/therapist safety;To address functional/ADL transfers PT goals addressed during session: Mobility/safety with mobility;Balance;Proper use of DME;Strengthening/ROM OT goals addressed during session: ADL's and self-care      AM-PAC OT "6 Clicks" Daily Activity     Outcome Measure   Help from another person eating meals?: A Little Help from another person taking care of personal grooming?: A Little Help from another person toileting, which includes using toliet, bedpan, or urinal?: A Little Help from another person bathing (including washing, rinsing, drying)?: A Lot Help from another person to put on and taking off regular upper body clothing?: A Little Help from another person to put on and taking off regular lower body clothing?:  A Lot 6 Click Score: 16    End of Session Equipment Utilized During Treatment: Gait belt;Rolling walker  OT Visit Diagnosis: Muscle weakness (generalized) (M62.81);Ataxia, unspecified (R27.0);Other symptoms and signs involving the nervous system (R29.898);Hemiplegia and hemiparesis;Unsteadiness on feet (R26.81) Hemiplegia - Right/Left: Right Hemiplegia - dominant/non-dominant: Dominant Hemiplegia - caused by: Cerebral infarction    Activity Tolerance Patient tolerated treatment well   Patient Left with call bell/phone within reach;in chair;with chair alarm set   Nurse Communication Mobility status        Time: [id number] OT Time Calculation (min): 35 min  Charges: OT General Charges $OT Visit: 1 Visit OT Treatments $Self Care/Home Management : 8-22 mins  [place], OTR/L Acute Rehab Pager: [phone] Office: [place] 04/29/2020, 5:36 PM

## 2020-04-29 NOTE — Progress Notes (Signed)
Physical Therapy Treatment Patient Details Name: Gregory Nunez. MRN: [id number] DOB: 1945/12/23 Today's Date: 04/29/2020    History of Present Illness Pt is a 75 year old male with a medical hx significant for dyspnea, alcohol use, prostate cancer, pancreatitis, HTN, HAV, DM2, and depression who presented with R-sided weakness, R-sided facial droop, and AMS when found wandering around a local [facility]. 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 They also showed superimposed remote lacunar infarcts involving the L paramedian pons and posterior R frontoparietal corona radiata. NIHSS of 3 and tPA not administered    PT Comments    Pt agreeable and participatory with OT/PT today. Worked on sitting balance followed by standing balance and pregait activities. Needs support to R side for knee and elbow buckling. Pt fatigues quickly with functional activity. Did not relay dizziness today. Mod A +2 for progression of mobility. PT will continue to follow.    Follow Up Recommendations  Supervision/Assistance - 24 hour;CIR     Equipment Recommendations  Rolling walker with 5" wheels;3in1 (PT)    Recommendations for Other Services Rehab consult     Precautions / Restrictions Precautions Precautions: Fall Precaution Comments: monitor BP (HTN and OH) Restrictions Weight Bearing Restrictions: No    Mobility  Bed Mobility Overal bed mobility: Needs Assistance Bed Mobility: Supine to Sit     Supine to sit: Min guard;HOB elevated     General bed mobility comments: HOB elevated and pt requiring increased time to transition around to EOB. Multimodal cues to complete transfer out to EOB to get feet on the floor. VC's for scooting R hip out more and pt looking at LLE and scooting L hip out instead.  Transfers Overall transfer level: Needs assistance Equipment  used: 1 person hand held assist;Rolling walker (2 wheeled) Transfers: Sit to/from [facility] Sit to Stand: Min assist;Mod assist;+2 safety/equipment;+2 physical assistance Stand pivot transfers: Min assist;Mod assist;+2 safety/equipment;+2 physical assistance       General transfer comment: pt needed support R knee and R elbow in standing and with transfer to collapse on R side with trunk rotation. Had difficulty stepping R foot. Mod A to control descent to chair.  Ambulation/Gait                 Stairs             Wheelchair Mobility    Modified Rankin (Stroke Patients Only) Modified Rankin (Stroke Patients Only) Pre-Morbid Rankin Score: No symptoms Modified Rankin: Moderately severe disability     Balance Overall balance assessment: Needs assistance Sitting-balance support: Feet supported;No upper extremity supported Sitting balance-Leahy Scale: Fair Sitting balance - Comments: no UE support, min guard for safety sitting statically EOB. Postural control: Right lateral lean Standing balance support: Bilateral upper extremity supported;During functional activity Standing balance-Leahy Scale: Poor Standing balance comment: Reliant on UE support and external support for balance. Worked on pregait activities in standing. Pt fatigues quickly                            Cognition Arousal/Alertness: Awake/alert Behavior During Therapy: WFL for tasks assessed/performed Overall Cognitive Status: Impaired/Different from baseline Area of Impairment: Attention;Following commands;Safety/judgement;Problem solving                 Orientation Level: Disoriented to;Time Current Attention Level: Sustained Memory: Decreased short-term memory  Following Commands: Follows one step commands consistently;Follows one step commands with increased time;Follows multi-step commands inconsistently Safety/Judgement: Decreased awareness of safety;Decreased  awareness of deficits Awareness: Emergent Problem Solving: Slow processing;Difficulty sequencing;Requires verbal cues General Comments: pt needs very direct, short, simple cues      Exercises      General Comments General comments (skin integrity, edema, etc.): No dizziness noted. Pt engaged in session. Had dentures, fork, and some remaining food in bed upon PT/OT arrival      Pertinent Vitals/Pain Pain Assessment: Faces Faces Pain Scale: No hurt    Home Living                      Prior Function            PT Goals (current goals can now be found in the care plan section) Acute Rehab PT Goals Patient Stated Goal: to go home PT Goal Formulation: With patient Time For Goal Achievement: 05/08/20 Potential to Achieve Goals: Good Progress towards PT goals: Progressing toward goals    Frequency    Min 4X/week      PT Plan Current plan remains appropriate    Co-evaluation PT/OT/SLP Co-Evaluation/Treatment: Yes Reason for Co-Treatment: Complexity of the patient's impairments (multi-system involvement);Necessary to address cognition/behavior during functional activity;For patient/therapist safety;To address functional/ADL transfers PT goals addressed during session: Mobility/safety with mobility;Balance;Proper use of DME;Strengthening/ROM        AM-PAC PT "6 Clicks" Mobility   Outcome Measure  Help needed turning from your back to your side while in a flat bed without using bedrails?: A Little Help needed moving from lying on your back to sitting on the side of a flat bed without using bedrails?: A Little Help needed moving to and from a bed to a chair (including a wheelchair)?: A Lot Help needed standing up from a chair using your arms (e.g., wheelchair or bedside chair)?: A Lot Help needed to walk in hospital room?: A Lot Help needed climbing 3-5 steps with a railing? : Total 6 Click Score: 13    End of Session Equipment Utilized During Treatment: Gait  belt Activity Tolerance: Patient limited by fatigue Patient left: in chair;with call bell/phone within reach;with chair alarm set Nurse Communication: Mobility status PT Visit Diagnosis: Unsteadiness on feet (R26.81);Other symptoms and signs involving the nervous system (R29.898)     Time: 7846-9629 PT Time Calculation (min) (ACUTE ONLY): 23 min  Charges:  $Gait Training: 8-22 mins                     Leighton Roach, [place]  Pager [phone] Office [place] 04/29/2020, 5:13 PM

## 2020-04-30 DIAGNOSIS — I10 Essential (primary) hypertension: Secondary | ICD-10-CM | POA: Diagnosis not present

## 2020-04-30 LAB — GLUCOSE, CAPILLARY
Glucose-Capillary: 130 mg/dL — ABNORMAL HIGH (ref 70–99)
Glucose-Capillary: 285 mg/dL — ABNORMAL HIGH (ref 70–99)
Glucose-Capillary: 210 mg/dL — ABNORMAL HIGH (ref 70–99)
Glucose-Capillary: 201 mg/dL — ABNORMAL HIGH (ref 70–99)

## 2020-04-30 NOTE — Progress Notes (Signed)
  Speech Language Pathology Treatment: Dysphagia;Cognitive-Linquistic  Patient Details Name: Gregory Nunez. MRN: [id number] DOB: Jul 19, 1945 Today's Date: 04/30/2020 Time: 2992-4268 SLP Time Calculation (min) (ACUTE ONLY): 23 min  Assessment / Plan / Recommendation Clinical Impression  Pt was seen for dysphagia treatment and was cooperative throughout the session. Pt's nurse tech and RN denied observance of signs of aspiration with p.o. intake, but stated that he does need some assistance with meals. Pt tolerated limited boluses of dysphagia 3 solids and up to 8 consecutive swallows of thin liquids via straw without overt s/sx of aspiration. Mild oral residue was noted and cleared with lingual sweeps and liquid washes. Mastication was mildly prolonged, but functional. Pt demonstrated improved awareness during this session, and was able to relay some of his deficits to his family member who called. He recalled various activities from the morning; however, cueing was consistently needed throughout the session for recall for dysarthria compensatory strategies. He demonstrated 0% accuracy with time management problems despite verbal prompts. He was re-educated regarding the nature of dysarthria, and compensatory strategies to improve speech intelligibility. Pt verbalized understanding regarding all areas of education and used compensatory strategies at the sentence level with 75% accuracy given itermmittent cues. It is recommened that his current diet be continued. His tolerance of thin liquids via straw has improved, but pt stated that he still prefers liquids via cup. SLP will continue to follow pt.    HPI HPI: 75 y.o. male with medical history significant for type 2 diabetes with diabetic retinopathy, hypertension, hyperlipidemia, prostate cancer, and alcohol use who presents to the ED for evaluation of right-sided weakness.  04/23/20 MRI head indicated 3 cm acute ischemic nonhemorrhagic infarct  involving the left  basal ganglia, corresponding with abnormality on prior CT.2. Additional 5 mm subacute ischemic nonhemorrhagic right periatrial  white matter infarct,      SLP Plan  Continue with current plan of care       Recommendations  Diet recommendations: Dysphagia 3 (mechanical soft);Thin liquid Liquids provided via: Cup;No straw Medication Administration: Whole meds with puree Supervision: Patient able to self feed;Intermittent supervision to cue for compensatory strategies Compensations: Slow rate;Small sips/bites;Minimize environmental distractions;Lingual sweep for clearance of pocketing            Gregory Nunez I. Gregory Nunez, Gregory Nunez, Gregory Nunez Office number [phone] Pager [phone]                Gregory Nunez 04/30/2020, 11:00 AM

## 2020-04-30 NOTE — Care Management Important Message (Signed)
Important Message  Patient Details  Name: Gregory Nunez. MRN: [id number] Date of Birth: April 16, 1946   Medicare Important Message Given:  Yes     Adria Costley [address] 04/30/2020, 9:23 AM

## 2020-04-30 NOTE — Progress Notes (Signed)
Physical Therapy Treatment Patient Details Name: Gregory Nunez. MRN: [id number] DOB: 02/25/1946 Today's Date: 04/30/2020    History of Present Illness Pt is a 75 year old male with a medical hx significant for dyspnea, alcohol use, prostate cancer, pancreatitis, HTN, HAV, DM2, and depression who presented with R-sided weakness, R-sided facial droop, and AMS when found wandering around a local [facility]. 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 They also showed superimposed remote lacunar infarcts involving the L paramedian pons and posterior R frontoparietal corona radiata. NIHSS of 3 and tPA not administered    PT Comments    Pt able to take several steps this session, but demonstrates poor weight shifting resulting in decreased foot advancement and a shuffling gait pattern. Pt required modAx1 and minAx1 with use of a RW to ambulate ~3 ft total laterally to chair and then slightly anteriorly. During 2nd gait training bout after BP improved (OH see General Comments below) and pt rested sitting in recliner, focused on facilitating adequate weight shifting for gait by having pt obtain a wide stance and pushing his hip laterally to either side, with success. As pt fatigued though, his trunk flexion increased along with the instability in his knees. Required minAx2 to come to stand from EOB and min guard with max cues and extra time to come to sit EOB. Will continue to follow acutely. Current recommendations remain appropriate.  Follow Up Recommendations  Supervision/Assistance - 24 hour;CIR     Equipment Recommendations  Rolling walker with 5" wheels;3in1 (PT)    Recommendations for Other Services Rehab consult     Precautions / Restrictions Precautions Precautions: Fall Precaution Comments: monitor BP (HTN and orthostatic hypotension) Restrictions Weight  Bearing Restrictions: No    Mobility  Bed Mobility Overal bed mobility: Needs Assistance Bed Mobility: Supine to Sit     Supine to sit: Min guard;HOB elevated     General bed mobility comments: Use of bed rails and HOB elevated to come to sit EOB with extra time and effort, min guard for safety. Cues for hand placement and to manage lges off EOB. Difficulty scooting all the way to EOB, esp on R hip.  Transfers Overall transfer level: Needs assistance Equipment used: Rolling walker (2 wheeled) Transfers: Sit to/from [facility] Sit to Stand: Min assist;+2 physical assistance;+2 safety/equipment Stand pivot transfers: Min assist;Mod assist;+2 physical assistance;+2 safety/equipment       General transfer comment: Sit to stand from EOB with cues for hand placement with minAx2 with extra time and effort to power up. Cues for manging RW and sequencing steps to chair from EOB. Provided R knee block intermittently as knee instability but no buckling noted. ModAx1 with minAx1 to transfer to chair.  Ambulation/Gait Ambulation/Gait assistance: Mod assist;Min assist Gait Distance (Feet): 3 Feet Assistive device: Rolling walker (2 wheeled) Gait Pattern/deviations: Decreased weight shift to left;Shuffle;Step-to pattern;Trunk flexed Gait velocity: reduced Gait velocity interpretation: <1.31 ft/sec, indicative of household ambulator General Gait Details: Ambulates at slow pace with trunk sway, trunk and knees flexed, and knee instability without buckling noted. Decreased bilat step length, esp on R with step-to gait, with evident shuffling, despite cues to correct. Provided verbal and tactile cues to inc weight shift to L and to advance R leg with poor carryover. Bouts of LOB requiring modAx1 and minAx1 to recover. 2 total gait training bouts, with seated rest break between.  On final bout, facilitated weihgt shifting laterally with wide BOS, cuing pt to move hip to either leg,  success, but knees beginning to buckle and inc trunk flexion with fatigue.   Stairs             Wheelchair Mobility    Modified Rankin (Stroke Patients Only) Modified Rankin (Stroke Patients Only) Pre-Morbid Rankin Score: No symptoms Modified Rankin: Moderately severe disability     Balance Overall balance assessment: Needs assistance Sitting-balance support: Feet supported;No upper extremity supported Sitting balance-Leahy Scale: Fair Sitting balance - Comments: no UE support, min gaurd for safety sitting statically EOB.   Standing balance support: Bilateral upper extremity supported;During functional activity Standing balance-Leahy Scale: Poor Standing balance comment: Reliant on UE support and external support for balance, with LOB bouts and assistance to recover.                            Cognition Arousal/Alertness: Awake/alert Behavior During Therapy: WFL for tasks assessed/performed Overall Cognitive Status: Impaired/Different from baseline Area of Impairment: Attention;Following commands;Safety/judgement;Memory;Problem solving;Awareness;Orientation                 Orientation Level: Disoriented to;Time;Person (stated DOB 7/ "50" / 1947.) Current Attention Level: Sustained Memory: Decreased short-term memory Following Commands: Follows one step commands consistently;Follows one step commands with increased time;Follows multi-step commands inconsistently Safety/Judgement: Decreased awareness of safety;Decreased awareness of deficits Awareness: Emergent Problem Solving: Slow processing;Difficulty sequencing;Requires verbal cues General Comments: Pt disoriented to DOB. Pt closing eyes often during session, but easily arousable. Conversating with eyes closed, most likely closed due to reports of dizziness? Extra time and repeated multi-modal cues for simple command following.      Exercises General Exercises - Lower Extremity Long Arc Quad:  Both;5 reps;Strengthening;Seated (with isometric and eccentric contractions also)    General Comments General comments (skin integrity, edema, etc.): BP start of session supine 122/66; BP sitting EOB 119/53; BP sitting after transfer to chair 96/48 (reported dizziness); BP after several min sitting in chair 114/54; BP after 2nd gait training bout 102/49      Pertinent Vitals/Pain Pain Assessment: No/denies pain Pain Intervention(s): Monitored during session    Home Living                      Prior Function            PT Goals (current goals can now be found in the care plan section) Acute Rehab PT Goals Patient Stated Goal: to improve PT Goal Formulation: With patient Time For Goal Achievement: 05/08/20 Potential to Achieve Goals: Good Progress towards PT goals: Progressing toward goals    Frequency    Min 4X/week      PT Plan Current plan remains appropriate    Co-evaluation              AM-PAC PT "6 Clicks" Mobility   Outcome Measure  Help needed turning from your back to your side while in a flat bed without using bedrails?: A Little Help needed moving from lying on your back to sitting on the side of a flat bed without using bedrails?: A Little Help needed moving to and from a bed to a chair (including a wheelchair)?: A Lot Help needed standing up from a chair using your arms (e.g., wheelchair or bedside chair)?: A Little Help needed to walk in hospital room?: A Lot Help needed climbing 3-5 steps with a railing? : Total  6 Click Score: 14    End of Session Equipment Utilized During Treatment: Gait belt Activity Tolerance: Patient tolerated treatment well Patient left: in chair;with call bell/phone within reach;with chair alarm set Nurse Communication: Mobility status;Other (comment) (BP) PT Visit Diagnosis: Unsteadiness on feet (R26.81);Other abnormalities of gait and mobility (R26.89);Muscle weakness (generalized) (M62.81);Difficulty in walking,  not elsewhere classified (R26.2);Other symptoms and signs involving the nervous system (R29.898)     Time: 6144-3154 PT Time Calculation (min) (ACUTE ONLY): 27 min  Charges:  $Gait Training: 8-22 mins $Therapeutic Activity: 8-22 mins                     Moishe Spice, PT, DPT Acute Rehabilitation Services  Pager: [phone] Office: [place] 04/30/2020, 4:35 PM

## 2020-04-30 NOTE — Progress Notes (Signed)
PROGRESS NOTE    Gregory Nunez.  AOZ:[id number] DOB: October 05, 1945 DOA: 04/23/2020 PCP: [name], MD    Brief Narrative:  Gregory Nunez. is a 75 year old male with past medical history significant for type 2 diabetes mellitus with diabetic retinopathy, essential hypertension, hyperlipidemia, prostate cancer and alcohol use disorder who presented to the ED with right-sided weakness.  [facility] was [facility] and EMS was activated with notable right-sided facial droop and altered mental status.  Upon chart review, concern for excessive alcohol abuse from prior presentations.  Patient reports his last drink was roughly 12 days ago.  Does report chronic tobacco use of 1.5 packs/day.  In the ED, BP 190/94, HR 95, RR 15, SPO2 97% room air.  WBC 5.2, hemoglobin 11.6, platelets 196.  Sodium 137, potassium 3.5, CO2 19, BUN 38, creatinine 2.35, glucose 110.  AST 68, ALT 39, alk phos 45, total bilirubin 0.8.  SARS-CoV-2/influenza A/B PCR negative.  CT head without contrast with new hypodensity in left paramedian pons consistent with acute infarction and ill-defined hypodensity left lenticular nuclear suspicious for subacute infarct.  Neurology was consulted.  Patient received 325 mg aspirin and 1 L NS bolus in the ED.  Also received Librium and Ativan as needed per CIWA protocol for concern of EtOH withdrawal.  Hospitalist service consulted for further evaluation and management of acute CVA.   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  CT head without contrast notable for ischemic infarct left basal ganglia without hemorrhage/mass.  MR brain with 3 mm acute ischemic nonhemorrhagic infarct left basal ganglia and 5 mm subacute infarct right periatrial white matter.  MRA head/neck with no large vessel occlusion but with short segment moderate stenosis mid basilar artery, short segment moderate stenosis 3 frontal left V1 segment, patent carotid arteries.  Hemoglobin A1c 6.5.  Lipid panel with total cholesterol 141, HDL 38, LDL 82, and triglycerides 105.  TTE with LVEF 60-65%, G1DD, mild MR, no LV regional wall motion abnormalities, RV systolic function normal, IVC normal in size.  --Neurology following, appreciate assistance --PT/OT recommends SNF --SLP evaluation: Dysphagia 3 diet, mechanical soft with thin liquids, meds with pure --Aspirin 81 mg p.o. daily, Plavix 75 mg p.o. daily x 3 weeks  followed by aspirin alone --Started atorvastatin 40 mg p.o. daily --No arrhythmias noted on telemetry such far, offered loop recorder placement, patient declined  --Pending SNF placement; CIR declined 2/2 poor social support, accepted at [facility] but since has been declined due to apparent active liability claim through [place] in the Medicare system  --dispo per SW  Acute Renal Failure Etiology likely prerenal from poor oral intake.  Received IV fluid bolus on admission. --Cr 2.35>2.20>1.66>1.51>1.49>1.41 (baseline 1.1) --Avoid nephrotoxins, renally dose all medications --Repeat BMP in a.m.  Essential hypertension On amlodipine 10 mg at home.   --BP 165/88 this am --restarted amlodipine 71m PO daily --started on coreg 25 mg BID --Continue to monitor BP closely  and slowly adjust to normal BP over next few days  Type 2 diabetes mellitus Hemoglobin A1c 6.5.  On Metformin 500 mg twice daily at home. --moderate insulin sliding scale for  coverage --CBGs before every meal/at bedtime  Hyperlipidemia Lipid panel with total cholesterol 141, HDL 38, LDL 82, and triglycerides 105. --Atorvastatin 40 mg p.o. daily  Tobacco use disorder Reports smoking 1.5 packs/day.  Counseled on need for complete cessation --Nicotine patch  EtOH use disorder --CIWAA protocol with symptom triggered Ativan --Thiamine, folic acid, multivitamin  Cognitive impairment Patient seen by Occupational Therapy, scored 2/10 on the medicog; which correlates to cognitive limitations that are required for safe completion of medication management and cooking.  Patient seen by psychiatry on 04/25/2020 and deemed to possess capacity for medical decision-making.  Etiology of his impairment likely confounded by his significant EtOH abuse disorder.   DVT prophylaxis: Lovenox Code Status: Full code Family Communication: Updated patient extensively at bedside  Disposition Plan:  Status is: Inpatient  Remains inpatient appropriate because:Ongoing diagnostic testing needed not appropriate for outpatient work up, Unsafe d/c plan, IV treatments appropriate due to intensity of illness or inability to take PO and Inpatient level of care appropriate due to severity of illness   Dispo: The patient is from: Home              Anticipated d/c is to: SNF              Anticipated d/c date is: 1 day              Patient currently is medically stable to d/c.   Consultants:   Neurology  Psychiatry  Procedures:   TTE  Antimicrobials:   None   Subjective: Patient seen and examined bedside, resting comfortably.  Sleeping but easily arousable.  No complaints this morning.  No other questions or concerns at this time. Awaiting SNF placement. Denies headache, no fever/chills/night sweats, no nausea/vomiting/diarrhea, no chest pain, no palpitations, no shortness of breath, no abdominal pain.  No acute events overnight per nursing staff.  Objective: Vitals:   04/29/20  2039 04/30/20 0026 04/30/20 0414 04/30/20 0721  BP: 129/65 135/68 (!) 143/72 (!) 151/71  Pulse: 79 81 78 81  Resp: $Remo'14 18 16 18  'CMTBI$ Temp: 98 F (36.7 C) 97.9 F (36.6 C) 97.7 F (36.5 C) 98.1 F (36.7 C)  TempSrc: Oral Oral  Oral  SpO2: 99% 98% 98% 99%  Weight:      Height:        Intake/Output Summary (Last 24 hours) at 04/30/2020 1110 Last data filed at 04/30/2020 1010 Gross per 24 hour  Intake 353 ml  Output 1600 ml  Net -1247 ml   Filed Weights   04/23/20 1743  Weight: 84.4 kg    Examination:  General exam: Appears calm and comfortable  Respiratory system: Clear to auscultation. Respiratory effort normal.  Oxygenating well on room air Cardiovascular system: S1 & S2 heard, RRR. No JVD, murmurs, rubs, gallops or clicks. No pedal edema. Gastrointestinal system: Abdomen is nondistended, soft and nontender. No organomegaly or masses felt. Normal bowel sounds heard. Central nervous system: Alert and oriented to person/place but not time ("I don't keep up with that").  Right-sided facial droop, otherwise no focal neurological deficits. Extremities: Symmetric 5 x 5 power. Skin: No rashes, lesions or ulcers Psychiatry: Judgement and insight appear poor. Mood & affect appropriate.     Data Reviewed: I have personally reviewed following labs and imaging studies  CBC: Recent  Labs  Lab 04/23/20 1715 04/23/20 1723  WBC 5.2  --   NEUTROABS 3.5  --   HGB 11.6* 11.9*  HCT 35.7* 35.0*  MCV 95.2  --   PLT 196  --    Basic Metabolic Panel: Recent Labs  Lab 04/23/20 1715 04/23/20 1723 04/25/20 0209 04/26/20 0433 04/27/20 0232 04/29/20 0456  NA 137 141 140 143 142 138  K 3.5 3.6 3.2* 3.9 3.6 3.6  CL 104 105 108 111 110 108  CO2 19*  --  28 21* 21* 18*  GLUCOSE 110* 108* 110* 141* 123* 147*  BUN 38* 38* 27* 23 26* 30*  CREATININE 2.35* 2.20* 1.66* 1.51* 1.46* 1.41*  CALCIUM 9.3  --  8.6* 9.2 9.0 8.9  MG  --   --  1.8  --   --   --    GFR: Estimated Creatinine  Clearance: 50.4 mL/min (A) (by C-G formula based on SCr of 1.41 mg/dL (H)). Liver Function Tests: Recent Labs  Lab 04/23/20 1715  AST 68*  ALT 39  ALKPHOS 45  BILITOT 0.8  PROT 8.0  ALBUMIN 3.4*   No results for input(s): LIPASE, AMYLASE in the last 168 hours. No results for input(s): AMMONIA in the last 168 hours. Coagulation Profile: Recent Labs  Lab 04/23/20 1715  INR 1.0   Cardiac Enzymes: No results for input(s): CKTOTAL, CKMB, CKMBINDEX, TROPONINI in the last 168 hours. BNP (last 3 results) No results for input(s): PROBNP in the last 8760 hours. HbA1C: No results for input(s): HGBA1C in the last 72 hours. CBG: Recent Labs  Lab 04/28/20 2148 04/29/20 1330 04/29/20 1729 04/29/20 2128 04/30/20 0613  GLUCAP 159* 282* 211* 200* 130*   Lipid Profile: No results for input(s): CHOL, HDL, LDLCALC, TRIG, CHOLHDL, LDLDIRECT in the last 72 hours. Thyroid Function Tests: No results for input(s): TSH, T4TOTAL, FREET4, T3FREE, THYROIDAB in the last 72 hours. Anemia Panel: No results for input(s): VITAMINB12, FOLATE, FERRITIN, TIBC, IRON, RETICCTPCT in the last 72 hours. Sepsis Labs: No results for input(s): PROCALCITON, LATICACIDVEN in the last 168 hours.  Recent Results (from the past 240 hour(s))  Resp Panel by RT-PCR (Flu A&B, Covid) Nasopharyngeal Swab     Status: None   Collection Time: 04/23/20  7:25 PM   Specimen: Nasopharyngeal Swab; Nasopharyngeal(NP) swabs in vial transport medium  Result Value Ref Range Status   SARS Coronavirus 2 by RT PCR NEGATIVE NEGATIVE Final    Comment: (NOTE) SARS-CoV-2 target nucleic acids are NOT DETECTED.  The SARS-CoV-2 RNA is generally detectable in upper respiratory specimens during the acute phase of infection. The lowest concentration of SARS-CoV-2 viral copies this assay can detect is 138 copies/mL. A negative result does not preclude SARS-Cov-2 infection and should not be used as the sole basis for treatment or other  patient management decisions. A negative result may occur with  improper specimen collection/handling, submission of specimen other than nasopharyngeal swab, presence of viral mutation(s) within the areas targeted by this assay, and inadequate number of viral copies(<138 copies/mL). A negative result must be combined with clinical observations, patient history, and epidemiological information. The expected result is Negative.  Fact Sheet for Patients:  EntrepreneurPulse.com.au  Fact Sheet for Healthcare Providers:  IncredibleEmployment.be  This test is no t yet approved or cleared by the [place] FDA and  has been authorized for detection and/or diagnosis of SARS-CoV-2 by FDA under an Emergency Use Authorization (EUA). This EUA will remain  in effect (meaning this test can be  used) for the duration of the COVID-19 declaration under Section 564(b)(1) of the Act, 21 U.S.C.section 360bbb-3(b)(1), unless the authorization is terminated  or revoked sooner.       Influenza A by PCR NEGATIVE NEGATIVE Final   Influenza B by PCR NEGATIVE NEGATIVE Final    Comment: (NOTE) The Xpert Xpress SARS-CoV-2/FLU/RSV plus assay is intended as an aid in the diagnosis of influenza from Nasopharyngeal swab specimens and should not be used as a sole basis for treatment. Nasal washings and aspirates are unacceptable for Xpert Xpress SARS-CoV-2/FLU/RSV testing.  Fact Sheet for Patients: EntrepreneurPulse.com.au  Fact Sheet for Healthcare Providers: IncredibleEmployment.be  This test is not yet approved or cleared by the [place] FDA and has been authorized for detection and/or diagnosis of SARS-CoV-2 by FDA under an Emergency Use Authorization (EUA). This EUA will remain in effect (meaning this test can be used) for the duration of the COVID-19 declaration under Section 564(b)(1) of the Act, 21 U.S.C. section  360bbb-3(b)(1), unless the authorization is terminated or revoked.  Performed at [facility], [place] [address], [place], [place] [address]   SARS CORONAVIRUS 2 (TAT 6-24 HRS) Nasopharyngeal Nasopharyngeal Swab     Status: None   Collection Time: 04/27/20 11:58 AM   Specimen: Nasopharyngeal Swab  Result Value Ref Range Status   SARS Coronavirus 2 NEGATIVE NEGATIVE Final    Comment: (NOTE) SARS-CoV-2 target nucleic acids are NOT DETECTED.  The SARS-CoV-2 RNA is generally detectable in upper and lower respiratory specimens during the acute phase of infection. Negative results do not preclude SARS-CoV-2 infection, do not rule out co-infections with other pathogens, and should not be used as the sole basis for treatment or other patient management decisions. Negative results must be combined with clinical observations, patient history, and epidemiological information. The expected result is Negative.  Fact Sheet for Patients: SugarRoll.be  Fact Sheet for Healthcare Providers: https://www.woods-mathews.com/  This test is not yet approved or cleared by the [place] FDA and  has been authorized for detection and/or diagnosis of SARS-CoV-2 by FDA under an Emergency Use Authorization (EUA). This EUA will remain  in effect (meaning this test can be used) for the duration of the COVID-19 declaration under Se ction 564(b)(1) of the Act, 21 U.S.C. section 360bbb-3(b)(1), unless the authorization is terminated or revoked sooner.  Performed at [facility], [place] [address], [place], [place] [address]          Radiology Studies: No results found.      Scheduled Meds: . amLODipine  10 mg Oral Daily  . aspirin EC  81 mg Oral Daily  . atorvastatin  40 mg Oral Daily  . carvedilol  25 mg Oral BID WC  . clopidogrel  75 mg Oral Daily  . enoxaparin (LOVENOX) injection  40 mg Subcutaneous Q24H  . insulin aspart  0-15 Units  Subcutaneous TID WC  . nicotine  21 mg Transdermal Daily  . thiamine  100 mg Oral Daily   Or  . thiamine  100 mg Intravenous Daily   Continuous Infusions:    LOS: 6 days    Time spent: 32 minutes spent on chart review, discussion with nursing staff, consultants, updating family and interview/physical exam; more than 50% of that time was spent in counseling and/or coordination of care.    [place] Ndiaye J [place] DO [facility] Available via Epic secure chat 7am-7pm After these hours, please refer to coverage provider listed on [email or web address] 04/30/2020, 11:10 AM

## 2020-04-30 NOTE — TOC Progression Note (Signed)
Transition of Care [facility]) - Progression Note    Patient Details  Name: Gregory Nunez. MRN: [id number] Date of Birth: 06-25-1945  Transition of Care [facility] CM/SW [place], [place] Phone Number: 04/30/2020, 4:26 PM  Clinical Narrative:   CSW spoke with patient's nephew, updated him on the issue with an open [employer] claim impacting his Medicare coverage for SNF at this time. Per nephew, the patient had someone [facility], which is what he thinks the claim is about. Nephew will call this evening to ask for more details, and will call CSW back tomorrow. CSW also updated the patient's nephew that [facility] has had a COVID outbreak today and will not be able to take the patient, and patient has no other bed offers at this time due to the [place] claim. CSW to follow.    Expected Discharge Plan: Skilled Nursing Facility Barriers to Discharge: SNF Pending bed offer,Insurance Authorization  Expected Discharge Plan and Services Expected Discharge Plan: [place] In-house Referral: Clinical Social Work Discharge Planning Services: CM Consult Post Acute Care Choice: [place] Living arrangements for the past 2 months: Apartment Expected Discharge Date: 04/25/20                                     Social Determinants of Health (SDOH) Interventions    Readmission Risk Interventions No flowsheet data found.

## 2020-05-01 DIAGNOSIS — I10 Essential (primary) hypertension: Secondary | ICD-10-CM | POA: Diagnosis not present

## 2020-05-01 LAB — GLUCOSE, CAPILLARY
Glucose-Capillary: 142 mg/dL — ABNORMAL HIGH (ref 70–99)
Glucose-Capillary: 284 mg/dL — ABNORMAL HIGH (ref 70–99)
Glucose-Capillary: 278 mg/dL — ABNORMAL HIGH (ref 70–99)
Glucose-Capillary: 165 mg/dL — ABNORMAL HIGH (ref 70–99)

## 2020-05-01 NOTE — Progress Notes (Signed)
PROGRESS NOTE    [name]  TKW:[id number] DOB: 1945/08/25 DOA: 04/23/2020 PCP: [name], MD    Brief Narrative:  Gregory Nunez. is a 75 year old male with past medical history significant for type 2 diabetes mellitus with diabetic retinopathy, essential hypertension, hyperlipidemia, prostate cancer and alcohol use disorder who presented to the ED with right-sided weakness.  Patient was found wandering around a local [facility] and EMS was activated with notable right-sided facial droop and altered mental status.  Upon chart review, concern for excessive alcohol abuse from prior presentations.  Patient reports his last drink was roughly 12 days ago.  Does report chronic tobacco use of 1.5 packs/day.  In the ED, BP 190/94, HR 95, RR 15, SPO2 97% room air.  WBC 5.2, hemoglobin 11.6, platelets 196.  Sodium 137, potassium 3.5, CO2 19, BUN 38, creatinine 2.35, glucose 110.  AST 68, ALT 39, alk phos 45, total bilirubin 0.8.  SARS-CoV-2/influenza A/B PCR negative.  CT head without contrast with new hypodensity in left paramedian pons consistent with acute infarction and ill-defined hypodensity left lenticular nuclear suspicious for subacute infarct.  Neurology was consulted.  Patient received 325 mg aspirin and 1 L NS bolus in the ED.  Also received Librium and Ativan as needed per CIWA protocol for concern of EtOH withdrawal.  Hospitalist service consulted for further evaluation and management of acute CVA.   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  CT head without contrast notable for ischemic infarct left basal ganglia without hemorrhage/mass.  MR brain with 3 mm acute ischemic nonhemorrhagic infarct left basal ganglia and 5 mm subacute infarct right periatrial white matter.  MRA head/neck with no large vessel occlusion but with short segment moderate stenosis mid basilar artery, short segment moderate stenosis 3 frontal left V1 segment, patent carotid arteries.  Hemoglobin A1c 6.5.  Lipid panel with total cholesterol 141, HDL 38, LDL 82, and triglycerides 105.  TTE with LVEF 60-65%, G1DD, mild MR, no LV regional wall motion abnormalities, RV systolic function normal, IVC normal in size.  --Neurology following, appreciate assistance --PT/OT recommends SNF --SLP evaluation: Dysphagia 3 diet, mechanical soft with thin liquids, meds with pure --Aspirin 81 mg p.o. daily, Plavix 75 mg p.o. daily x 3 weeks  followed by aspirin alone --Started atorvastatin 40 mg p.o. daily --No arrhythmias noted on telemetry such far, offered loop recorder placement, patient declined  --Pending SNF placement; CIR declined 2/2 poor social support, accepted at [facility] but since has been declined due to apparent active liability claim through [place] in the Medicare system  --dispo per SW  Acute Renal Failure Etiology likely prerenal from poor oral intake.  Received IV fluid bolus on admission. --Cr 2.35>2.20>1.66>1.51>1.49>1.41 (baseline 1.1) --Avoid nephrotoxins, renally dose all medications --Repeat BMP in a.m.  Essential hypertension On amlodipine 10 mg at home.   --BP 145/79 this am --restarted amlodipine 10mg  PO daily --started on coreg 25 mg BID --Continue to monitor BP closely  Type 2 diabetes mellitus Hemoglobin A1c 6.5.  On Metformin 500 mg twice daily at home. --moderate insulin sliding scale for coverage --CBGs before every meal/at  bedtime  Hyperlipidemia Lipid panel with total cholesterol 141, HDL 38, LDL 82, and triglycerides 105. --Atorvastatin 40 mg p.o. daily  Tobacco use disorder Reports smoking 1.5 packs/day.  Counseled on need for complete cessation --Nicotine patch  EtOH use disorder --CIWAA protocol with symptom triggered Ativan --Thiamine, folic acid, multivitamin  Cognitive impairment Patient seen by Occupational Therapy, scored 2/10 on the medicog; which correlates to cognitive limitations that are required for safe completion of medication management and cooking.  Patient seen by psychiatry on 04/25/2020 and deemed to possess capacity for medical decision-making.  Etiology of his impairment likely confounded by his significant EtOH abuse disorder.   DVT prophylaxis: Lovenox Code Status: Full code Family Communication: Updated patient extensively at bedside  Disposition Plan:  Status is: Inpatient  Remains inpatient appropriate because:Ongoing diagnostic testing needed not appropriate for outpatient work up, Unsafe d/c plan, IV treatments appropriate due to intensity of illness or inability to take PO and Inpatient level of care appropriate due to severity of illness   Dispo: The patient is from: Home              Anticipated d/c is to: SNF              Anticipated d/c date is: 1 day              Patient currently is medically stable to d/c.   Consultants:   Neurology  Psychiatry  Procedures:   TTE  Antimicrobials:   None   Subjective: Patient seen and examined bedside, resting comfortably.  Eating breakfast, no complaints this morning.  Continues to await SNF placement, difficult given his apparent liability claim from [place] and Medicare system per social work.  Denies headache, no fever/chills/night sweats, no nausea/vomiting/diarrhea, no chest pain, no palpitations, no shortness of breath, no abdominal pain.  No acute events overnight per nursing staff.  Objective: Vitals:    04/30/20 2033 04/30/20 2328 05/01/20 0344 05/01/20 0803  BP: 129/69 135/71 (!) 145/79 (!) 148/78  Pulse: 76 77 83 86  Resp: $Remo'18 20 16 18  'fLwbD$ Temp: 97.8 F (36.6 C) 98.7 F (37.1 C) 98.1 F (36.7 C) 97.8 F (36.6 C)  TempSrc: Oral Oral Oral   SpO2: 99% 98% 98% 100%  Weight:      Height:        Intake/Output Summary (Last 24 hours) at 05/01/2020 1029 Last data filed at 05/01/2020 0600 Gross per 24 hour  Intake --  Output 1300 ml  Net -1300 ml   Filed Weights   04/23/20 1743  Weight: 84.4 kg    Examination:  General exam: Appears calm and comfortable  Respiratory system: Clear to auscultation. Respiratory effort normal.  Oxygenating well on room air Cardiovascular system: S1 & S2 heard, RRR. No JVD, murmurs, rubs, gallops or clicks. No pedal edema. Gastrointestinal system: Abdomen is nondistended, soft and nontender. No organomegaly or masses felt. Normal bowel sounds heard. Central nervous system: Alert and oriented to person/place but not time ("I don't keep up with that").  Right-sided facial droop, otherwise no focal neurological deficits. Extremities: Symmetric 5 x 5 power. Skin: No rashes, lesions or ulcers Psychiatry: Judgement and insight appear poor. Mood & affect appropriate.     Data Reviewed: I have personally reviewed following labs and imaging studies  CBC: No results for input(s): WBC, NEUTROABS, HGB,  HCT, MCV, PLT in the last 168 hours. Basic Metabolic Panel: Recent Labs  Lab 04/25/20 0209 04/26/20 0433 04/27/20 0232 04/29/20 0456  NA 140 143 142 138  K 3.2* 3.9 3.6 3.6  CL 108 111 110 108  CO2 28 21* 21* 18*  GLUCOSE 110* 141* 123* 147*  BUN 27* 23 26* 30*  CREATININE 1.66* 1.51* 1.46* 1.41*  CALCIUM 8.6* 9.2 9.0 8.9  MG 1.8  --   --   --    GFR: Estimated Creatinine Clearance: 50.4 mL/min (A) (by C-G formula based on SCr of 1.41 mg/dL (H)). Liver Function Tests: No results for input(s): AST, ALT, ALKPHOS, BILITOT, PROT, ALBUMIN in the last 168  hours. No results for input(s): LIPASE, AMYLASE in the last 168 hours. No results for input(s): AMMONIA in the last 168 hours. Coagulation Profile: No results for input(s): INR, PROTIME in the last 168 hours. Cardiac Enzymes: No results for input(s): CKTOTAL, CKMB, CKMBINDEX, TROPONINI in the last 168 hours. BNP (last 3 results) No results for input(s): PROBNP in the last 8760 hours. HbA1C: No results for input(s): HGBA1C in the last 72 hours. CBG: Recent Labs  Lab 04/30/20 0613 04/30/20 1252 04/30/20 1545 04/30/20 2126 05/01/20 0612  GLUCAP 130* 285* 210* 201* 142*   Lipid Profile: No results for input(s): CHOL, HDL, LDLCALC, TRIG, CHOLHDL, LDLDIRECT in the last 72 hours. Thyroid Function Tests: No results for input(s): TSH, T4TOTAL, FREET4, T3FREE, THYROIDAB in the last 72 hours. Anemia Panel: No results for input(s): VITAMINB12, FOLATE, FERRITIN, TIBC, IRON, RETICCTPCT in the last 72 hours. Sepsis Labs: No results for input(s): PROCALCITON, LATICACIDVEN in the last 168 hours.  Recent Results (from the past 240 hour(s))  Resp Panel by RT-PCR (Flu A&B, Covid) Nasopharyngeal Swab     Status: None   Collection Time: 04/23/20  7:25 PM   Specimen: Nasopharyngeal Swab; Nasopharyngeal(NP) swabs in vial transport medium  Result Value Ref Range Status   SARS Coronavirus 2 by RT PCR NEGATIVE NEGATIVE Final    Comment: (NOTE) SARS-CoV-2 target nucleic acids are NOT DETECTED.  The SARS-CoV-2 RNA is generally detectable in upper respiratory specimens during the acute phase of infection. The lowest concentration of SARS-CoV-2 viral copies this assay can detect is 138 copies/mL. A negative result does not preclude SARS-Cov-2 infection and should not be used as the sole basis for treatment or other patient management decisions. A negative result may occur with  improper specimen collection/handling, submission of specimen other than nasopharyngeal swab, presence of viral mutation(s)  within the areas targeted by this assay, and inadequate number of viral copies(<138 copies/mL). A negative result must be combined with clinical observations, patient history, and epidemiological information. The expected result is Negative.  Fact Sheet for Patients:  EntrepreneurPulse.com.au  Fact Sheet for Healthcare Providers:  IncredibleEmployment.be  This test is no t yet approved or cleared by the [place] FDA and  has been authorized for detection and/or diagnosis of SARS-CoV-2 by FDA under an Emergency Use Authorization (EUA). This EUA will remain  in effect (meaning this test can be used) for the duration of the COVID-19 declaration under Section 564(b)(1) of the Act, 21 U.S.C.section 360bbb-3(b)(1), unless the authorization is terminated  or revoked sooner.       Influenza A by PCR NEGATIVE NEGATIVE Final   Influenza B by PCR NEGATIVE NEGATIVE Final    Comment: (NOTE) The Xpert Xpress SARS-CoV-2/FLU/RSV plus assay is intended as an aid in the diagnosis of influenza from Nasopharyngeal swab specimens and  should not be used as a sole basis for treatment. Nasal washings and aspirates are unacceptable for Xpert Xpress SARS-CoV-2/FLU/RSV testing.  Fact Sheet for Patients: EntrepreneurPulse.com.au  Fact Sheet for Healthcare Providers: IncredibleEmployment.be  This test is not yet approved or cleared by the [place] FDA and has been authorized for detection and/or diagnosis of SARS-CoV-2 by FDA under an Emergency Use Authorization (EUA). This EUA will remain in effect (meaning this test can be used) for the duration of the COVID-19 declaration under Section 564(b)(1) of the Act, 21 U.S.C. section 360bbb-3(b)(1), unless the authorization is terminated or revoked.  Performed at [facility], [place] [address] [place], [place] [address]   SARS CORONAVIRUS 2 (TAT 6-24 HRS) Nasopharyngeal  Nasopharyngeal Swab     Status: None   Collection Time: 04/27/20 11:58 AM   Specimen: Nasopharyngeal Swab  Result Value Ref Range Status   SARS Coronavirus 2 NEGATIVE NEGATIVE Final    Comment: (NOTE) SARS-CoV-2 target nucleic acids are NOT DETECTED.  The SARS-CoV-2 RNA is generally detectable in upper and lower respiratory specimens during the acute phase of infection. Negative results do not preclude SARS-CoV-2 infection, do not rule out co-infections with other pathogens, and should not be used as the sole basis for treatment or other patient management decisions. Negative results must be combined with clinical observations, patient history, and epidemiological information. The expected result is Negative.  Fact Sheet for Patients: SugarRoll.be  Fact Sheet for Healthcare Providers: https://www.woods-mathews.com/  This test is not yet approved or cleared by the [place] FDA and  has been authorized for detection and/or diagnosis of SARS-CoV-2 by FDA under an Emergency Use Authorization (EUA). This EUA will remain  in effect (meaning this test can be used) for the duration of the COVID-19 declaration under Se ction 564(b)(1) of the Act, 21 U.S.C. section 360bbb-3(b)(1), unless the authorization is terminated or revoked sooner.  Performed at [facility], [place] [address] [place], [place] [address]          Radiology Studies: No results found.      Scheduled Meds: . amLODipine  10 mg Oral Daily  . aspirin EC  81 mg Oral Daily  . atorvastatin  40 mg Oral Daily  . carvedilol  25 mg Oral BID WC  . clopidogrel  75 mg Oral Daily  . enoxaparin (LOVENOX) injection  40 mg Subcutaneous Q24H  . insulin aspart  0-15 Units Subcutaneous TID WC  . nicotine  21 mg Transdermal Daily  . thiamine  100 mg Oral Daily   Or  . thiamine  100 mg Intravenous Daily   Continuous Infusions:    LOS: 7 days    Time spent: 32  minutes spent on chart review, discussion with nursing staff, consultants, updating family and interview/physical exam; more than 50% of that time was spent in counseling and/or coordination of care.    Neelie Welshans J [place] DO [facility] Available via Epic secure chat 7am-7pm After these hours, please refer to coverage provider listed on [email or web address] 05/01/2020, 10:29 AM

## 2020-05-01 NOTE — Plan of Care (Signed)
  Problem: Education: Goal: Knowledge of disease or condition will improve Outcome: Progressing Goal: Knowledge of secondary prevention will improve Outcome: Progressing Goal: Knowledge of patient specific risk factors addressed and post discharge goals established will improve Outcome: Progressing   Problem: Education: Goal: Knowledge of General Education information will improve Description: Including pain rating scale, medication(s)/side effects and non-pharmacologic comfort measures Outcome: Progressing   Problem: Health Behavior/Discharge Planning: Goal: Ability to manage health-related needs will improve Outcome: Progressing   Problem: Clinical Measurements: Goal: Ability to maintain clinical measurements within normal limits will improve Outcome: Progressing Goal: Will remain free from infection Outcome: Progressing Goal: Diagnostic test results will improve Outcome: Progressing Goal: Respiratory complications will improve Outcome: Progressing Goal: Cardiovascular complication will be avoided Outcome: Progressing   Problem: Activity: Goal: Risk for activity intolerance will decrease Outcome: Progressing   Problem: Nutrition: Goal: Adequate nutrition will be maintained Outcome: Progressing   Problem: Coping: Goal: Level of anxiety will decrease Outcome: Progressing   Problem: Elimination: Goal: Will not experience complications related to bowel motility Outcome: Progressing Goal: Will not experience complications related to urinary retention Outcome: Progressing   Problem: Pain Managment: Goal: General experience of comfort will improve Outcome: Progressing   Problem: Safety: Goal: Ability to remain free from injury will improve Outcome: Progressing   Problem: Skin Integrity: Goal: Risk for impaired skin integrity will decrease Outcome: Progressing

## 2020-05-01 NOTE — Progress Notes (Signed)
Physical Therapy Treatment Patient Details Name: Gregory Nunez. MRN: [id number] DOB: 14-Jan-1946 Today's Date: 05/01/2020    History of Present Illness Pt is a 75 year old male with a medical hx significant for dyspnea, alcohol use, prostate cancer, pancreatitis, HTN, HAV, DM2, and depression who presented with R-sided weakness, R-sided facial droop, and AMS when found wandering around a local CVS. 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 They also showed superimposed remote lacunar infarcts involving the L paramedian pons and posterior R frontoparietal corona radiata. NIHSS of 3 and tPA not administered    PT Comments    Pt continuing to make progess towards his goals. Focused session on facilitating learning with transfers, progressing to only requiring minAx1 to come to stand 10x from recliner and 3x from EOB. However, even with faded feedback, pt has difficulty remembering proper hand placement on current sitting surface to push up to stand rather than pulling up on RW. Primed quads with 5x LAQ sitting EOB prior to gait training to inc knee extension for improved balance as pt was displaying knee buckling, esp on the L, as he fatigued. Pt continues to demonstrate difficulty comprehending cues to shift weight anterior to L leg <> posterior to R leg without moving feet. He was able to maintain improved knee extension shifting weight laterally between legs instead. Will continue to follow acutely. Current recommendations remain appropriate.  Follow Up Recommendations  Supervision/Assistance - 24 hour;CIR (if pt does not qualify for CIR then SNF)     Equipment Recommendations  Rolling walker with 5" wheels;3in1 (PT)    Recommendations for Other Services Rehab consult     Precautions / Restrictions Precautions Precautions: Fall Precaution Comments:  monitor BP (HTN and orthostatic hypotension) Restrictions Weight Bearing Restrictions: No    Mobility  Bed Mobility Overal bed mobility: Needs Assistance Bed Mobility: Sit to Supine       Sit to supine: Min assist   General bed mobility comments: Cued pt to bring one leg up onto bed at a time with descent of trunk, difficulty managing legs thus minA to complete.  Transfers Overall transfer level: Needs assistance Equipment used: Rolling walker (2 wheeled) Transfers: Sit to/from [facility] Sit to Stand: Min assist Stand pivot transfers: Mod assist       General transfer comment: Sit to stand practiced 10x from recliner and 3x from EOB, attempting to provide faded feedback for set-up and sequencing transition of hands to RW with success except poor carryover with hand placement to push up to stand. Min-modA to control descent back to chair, cuing pt to look and reach back for chair. ModA for stand step to L to bed from chair due to L knee buckling.  Ambulation/Gait Ambulation/Gait assistance: Mod assist Gait Distance (Feet): 3 Feet Assistive device: Rolling walker (2 wheeled) Gait Pattern/deviations: Decreased weight shift to left;Shuffle;Step-to pattern;Trunk flexed;Decreased step length - right;Decreased step length - left;Decreased stride length;Decreased weight shift to right Gait velocity: reduced Gait velocity interpretation: <1.31 ft/sec, indicative of household ambulator General Gait Details: Several small side steps to L chair to EOB with use of RW, shuffling feet and displaying poor weight shifting and thus dec feet clearance and step length. L knee buckling 1st bout of step to EOB thus modA for balance. 2 additional standing gait training bouts, 1x cuing pt to shift weight anterior to L foot <> posterior  to R but bilat knees buckling even with tactile cues. Final bout shifting weight laterally between legs, cuing for knee extension at quads and to extend  hips and look superiorly, noted success but fatigue with inc knee instability with time.   Stairs             Wheelchair Mobility    Modified Rankin (Stroke Patients Only) Modified Rankin (Stroke Patients Only) Pre-Morbid Rankin Score: No symptoms Modified Rankin: Moderately severe disability     Balance Overall balance assessment: Needs assistance Sitting-balance support: Feet supported;No upper extremity supported Sitting balance-Leahy Scale: Fair Sitting balance - Comments: no UE support, min guard for safety sitting statically EOB.   Standing balance support: Bilateral upper extremity supported;During functional activity Standing balance-Leahy Scale: Poor Standing balance comment: Reliant on UE support and external support for balance, with LOB bouts and assistance to recover 2/2 knee buckling esp on L.                            Cognition Arousal/Alertness: Awake/alert Behavior During Therapy: WFL for tasks assessed/performed Overall Cognitive Status: Impaired/Different from baseline Area of Impairment: Attention;Following commands;Safety/judgement;Memory;Problem solving;Awareness                   Current Attention Level: Sustained Memory: Decreased short-term memory Following Commands: Follows one step commands consistently;Follows one step commands with increased time;Follows multi-step commands inconsistently Safety/Judgement: Decreased awareness of safety;Decreased awareness of deficits Awareness: Emergent Problem Solving: Slow processing;Difficulty sequencing;Requires verbal cues General Comments: A&Ox4. Extra time and repeated multi-modal cues for simple command following. Required reminders for proper hand placement for transfers.      Exercises General Exercises - Lower Extremity Long Arc Quad: Both;Strengthening;Seated;5 reps    General Comments General comments (skin integrity, edema, etc.): BP 124/67 sitting in recliner       Pertinent Vitals/Pain Pain Assessment: 0-10 Pain Score: 6  Pain Location: L anterior chest inferior to underarm Pain Descriptors / Indicators: Aching Pain Intervention(s): Monitored during session;Limited activity within patient's tolerance;Repositioned    Home Living                      Prior Function            PT Goals (current goals can now be found in the care plan section) Acute Rehab PT Goals Patient Stated Goal: to improve PT Goal Formulation: With patient Time For Goal Achievement: 05/08/20 Potential to Achieve Goals: Good Progress towards PT goals: Progressing toward goals    Frequency    Min 4X/week      PT Plan Current plan remains appropriate    Co-evaluation              AM-PAC PT "6 Clicks" Mobility   Outcome Measure  Help needed turning from your back to your side while in a flat bed without using bedrails?: A Little Help needed moving from lying on your back to sitting on the side of a flat bed without using bedrails?: A Little Help needed moving to and from a bed to a chair (including a wheelchair)?: A Lot Help needed standing up from a chair using your arms (e.g., wheelchair or bedside chair)?: A Little Help needed to walk in hospital room?: A Lot Help needed climbing 3-5 steps with a railing? : Total 6 Click Score: 14    End of Session Equipment Utilized During Treatment: Gait belt Activity Tolerance: Patient tolerated treatment well Patient  left: in bed;with call bell/phone within reach;with bed alarm set Nurse Communication: Mobility status (pt report of holding onto wash cloth with old nose bleed on it) PT Visit Diagnosis: Unsteadiness on feet (R26.81);Other abnormalities of gait and mobility (R26.89);Muscle weakness (generalized) (M62.81);Difficulty in walking, not elsewhere classified (R26.2);Other symptoms and signs involving the nervous system (R29.898)     Time: 6294-7654 PT Time Calculation (min) (ACUTE ONLY): 41  min  Charges:  $Gait Training: 8-22 mins $Therapeutic Activity: 23-37 mins                     Moishe Spice, PT, DPT Acute Rehabilitation Services  Pager: [phone] Office: [place] 05/01/2020, 4:37 PM

## 2020-05-01 NOTE — TOC Progression Note (Signed)
Transition of Care [facility]) - Progression Note    Patient Details  Name: Aadit Hagood. MRN: [id number] Date of Birth: Oct 05, 1945  Transition of Care [facility]) CM/SW [place], [place] Phone Number: 05/01/2020, 11:49 AM  Clinical Narrative:   CSW spoke with patient's nephew this morning, that he had contacted [facility] and they have no open or pending claims on record for his uncle. Nephew provided contact information for CSW to verify, and receive written verification to send to SNFs. Patient's only bed offer had been [facility], and they are currently unable to take patients due to a COVID outbreak. CSW provided update to nephew, and will try to obtain another bed offer for the patient. CSW faxed out referral again to pending SNF offers. No bed offers at this time. CSW to follow.    Expected Discharge Plan: Skilled Nursing Facility Barriers to Discharge: SNF Pending bed offer,Insurance Authorization  Expected Discharge Plan and Services Expected Discharge Plan: [place] In-house Referral: Clinical Social Work Discharge Planning Services: CM Consult Post Acute Care Choice: [place] Living arrangements for the past 2 months: Apartment Expected Discharge Date: 04/25/20                                     Social Determinants of Health (SDOH) Interventions    Readmission Risk Interventions No flowsheet data found.

## 2020-05-02 DIAGNOSIS — I10 Essential (primary) hypertension: Secondary | ICD-10-CM | POA: Diagnosis not present

## 2020-05-02 LAB — GLUCOSE, CAPILLARY
Glucose-Capillary: 142 mg/dL — ABNORMAL HIGH (ref 70–99)
Glucose-Capillary: 150 mg/dL — ABNORMAL HIGH (ref 70–99)
Glucose-Capillary: 207 mg/dL — ABNORMAL HIGH (ref 70–99)
Glucose-Capillary: 179 mg/dL — ABNORMAL HIGH (ref 70–99)
Glucose-Capillary: 195 mg/dL — ABNORMAL HIGH (ref 70–99)

## 2020-05-02 LAB — BASIC METABOLIC PANEL
Anion gap: 8 (ref 5–15)
BUN: 35 mg/dL — ABNORMAL HIGH (ref 8–23)
CO2: 23 mmol/L (ref 22–32)
Calcium: 8.9 mg/dL (ref 8.9–10.3)
Chloride: 107 mmol/L (ref 98–111)
Creatinine, Ser: 1.35 mg/dL — ABNORMAL HIGH (ref 0.61–1.24)
GFR, Estimated: 56 mL/min — ABNORMAL LOW (ref >60)
Glucose, Bld: 146 mg/dL — ABNORMAL HIGH (ref 70–99)
Potassium: 3.5 mmol/L (ref 3.5–5.1)
Sodium: 138 mmol/L (ref 135–145)

## 2020-05-02 NOTE — Progress Notes (Signed)
PROGRESS NOTE    Gregory Nunez.  IHW:[id number]  DOB: 15-Sep-1945  DOA: 04/23/2020 PCP: [name], MD Outpatient Specialists:   Hospital course:  Gregory Flirt [place]. is a 75 year old male with past medical history significant for type 2 diabetes mellitus with diabetic retinopathy, essential hypertension, hyperlipidemia, prostate cancer and alcohol use disorder who presented to the ED with right-sided weakness.  Patient was found wandering around a local [facility] and EMS was activated with notable right-sided facial droop and altered mental status.  Upon chart review, concern for excessive alcohol abuse from prior presentations. CT head without contrast with new hypodensity in left paramedian pons consistent with acute infarction and ill-defined hypodensity left lenticular nuclear suspicious for subacute infarct.  Neurology was consulted.  Patient received 325 mg aspirin and 1 L NS bolus in the ED.  Also received Librium and Ativan as needed per CIWA protocol for concern of EtOH withdrawal.    Patient was admitted for treatment of acute CVA and possible alcohol withdrawal.   Subjective:   Patient feels like he is doing quite well has no complaints at all.   Objective: Vitals:   05/02/20 0733 05/02/20 0919 05/02/20 1158 05/02/20 1558  BP: 138/74 123/62 123/64 132/65  Pulse: 87  78 82  Resp: 15  16 17   Temp: 98.5 F (36.9 C)  98.3 F (36.8 C) 97.7 F (36.5 C)  TempSrc: Oral  Oral Oral  SpO2: 98%  100% 100%  Weight:      Height:        Intake/Output Summary (Last 24 hours) at 05/02/2020 1826 Last data filed at 05/02/2020 1604 Gross per 24 hour  Intake 240 ml  Output 1875 ml  Net -1635 ml   Filed Weights   04/23/20 1743  Weight: 84.4 kg     Exam:  General: Relatively well-appearing man sitting up in bed watching TV in no distress. Eyes: sclera anicteric, conjuctiva mild injection bilaterally CVS: S1-S2, regular  Respiratory:  decreased air entry  bilaterally secondary to decreased inspiratory effort, rales at bases  GI: NABS, soft, NT  LE: No edema.  Psych:  mood and affect appropriate to situation.   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 MR shows 3 mm acute ischemic nonhemorrhagic infarct of the left basal ganglia and 5 mm subacute infarct right periatrial white matter. 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 Patient scored 2 out of 10 on the [facility] which correlates to cognitive limitations that are required for safe completion of medication management and cooking. Patient seen by psychiatry in 04/25/2020 and was deemed to have capacity for medical decision-making.    DVT prophylaxis: Lovenox Code Status: Full Family Communication: None Disposition Plan:   Patient is from: Home  Anticipated Discharge Location: SNF  Barriers to Discharge: Awaiting SNF bed  Is patient medically stable for Discharge: Yes   Consultants:  Neurology  Psychiatry  Procedures:  TTE  Antimicrobials:  None   Data Reviewed:  Basic Metabolic Panel: Recent Labs  Lab 04/26/20 0433 04/27/20 0232 04/29/20 0456 05/02/20 0437  NA 143 142 138 138  K 3.9 3.6 3.6 3.5  CL 111 110 108 107  CO2 21* 21* 18* 23  GLUCOSE 141* 123* 147* 146*  BUN 23 26* 30* 35*  CREATININE 1.51* 1.46* 1.41* 1.35*  CALCIUM 9.2 9.0 8.9 8.9   Liver  Function Tests: No results for input(s): AST, ALT, ALKPHOS, BILITOT, PROT, ALBUMIN in the last 168 hours. No results for input(s): LIPASE, AMYLASE in the last 168 hours. No results for input(s): AMMONIA in the last 168 hours. CBC: No results for input(s): WBC, NEUTROABS, HGB, HCT, MCV, PLT in the last 168 hours. Cardiac Enzymes: No results for input(s): CKTOTAL, CKMB, CKMBINDEX, TROPONINI in the last 168 hours. BNP (last 3 results) No results for input(s): PROBNP in the last 8760 hours. CBG: Recent Labs  Lab 05/01/20 2133 05/02/20 0605 05/02/20 0735 05/02/20 1159 05/02/20 1600  GLUCAP 165* 142* 150* 207* 179*    Recent Results (from the past 240 hour(s))  Resp Panel by RT-PCR (Flu A&B, Covid) Nasopharyngeal Swab     Status: None   Collection Time: 04/23/20  7:25 PM   Specimen: Nasopharyngeal Swab; Nasopharyngeal(NP) swabs in vial transport medium  Result Value Ref Range Status   SARS Coronavirus 2 by RT PCR NEGATIVE NEGATIVE Final    Comment: (NOTE) SARS-CoV-2 target nucleic acids are NOT DETECTED.  The SARS-CoV-2 RNA is generally detectable in upper respiratory specimens during the acute phase of infection. The lowest concentration of SARS-CoV-2 viral copies this assay can detect is 138 copies/mL. A negative result does not preclude SARS-Cov-2 infection and should not be used as the sole basis for treatment or other patient management decisions. A negative result may occur with  improper specimen collection/handling, submission of specimen other than nasopharyngeal swab, presence of viral mutation(s) within the areas targeted by this assay, and inadequate number of viral copies(<138 copies/mL). A negative result must be combined with clinical observations, patient history, and epidemiological information. The expected result is Negative.  Fact Sheet for Patients:  EntrepreneurPulse.com.au  Fact Sheet for Healthcare Providers:   IncredibleEmployment.be  This test is no t yet approved or cleared by the [place] FDA and  has been authorized for detection and/or diagnosis of SARS-CoV-2 by FDA under an Emergency Use Authorization (EUA). This EUA will remain  in effect (meaning this test can be used) for the duration of the COVID-19 declaration under Section 564(b)(1) of the Act, 21 U.S.C.section 360bbb-3(b)(1), unless the authorization is terminated  or revoked sooner.       Influenza A by PCR NEGATIVE NEGATIVE Final   Influenza B by PCR NEGATIVE NEGATIVE Final    Comment: (NOTE) The Xpert Xpress SARS-CoV-2/FLU/RSV plus assay is intended as an aid in the diagnosis of influenza from Nasopharyngeal swab specimens and should not be used as a sole basis for treatment. Nasal washings and aspirates are unacceptable for Xpert Xpress SARS-CoV-2/FLU/RSV testing.  Fact Sheet for Patients: EntrepreneurPulse.com.au  Fact Sheet for Healthcare Providers: IncredibleEmployment.be  This test is not yet approved or cleared by the [place] FDA and has been authorized for detection and/or diagnosis of SARS-CoV-2 by FDA under an Emergency Use Authorization (EUA). This EUA will remain in effect (meaning this test can be used) for the duration of the COVID-19 declaration under Section 564(b)(1) of the Act, 21 U.S.C. section 360bbb-3(b)(1), unless the authorization is terminated or  revoked.  Performed at [facility], [place] [address], [place], [place] [address]   SARS CORONAVIRUS 2 (TAT 6-24 HRS) Nasopharyngeal Nasopharyngeal Swab     Status: None   Collection Time: 04/27/20 11:58 AM   Specimen: Nasopharyngeal Swab  Result Value Ref Range Status   SARS Coronavirus 2 NEGATIVE NEGATIVE Final    Comment: (NOTE) SARS-CoV-2 target nucleic acids are NOT DETECTED.  The SARS-CoV-2 RNA is generally detectable in upper and lower respiratory specimens during the  acute phase of infection. Negative results do not preclude SARS-CoV-2 infection, do not rule out co-infections with other pathogens, and should not be used as the sole basis for treatment or other patient management decisions. Negative results must be combined with clinical observations, patient history, and epidemiological information. The expected result is Negative.  Fact Sheet for Patients: SugarRoll.be  Fact Sheet for Healthcare Providers: https://www.woods-mathews.com/  This test is not yet approved or cleared by the [place] FDA and  has been authorized for detection and/or diagnosis of SARS-CoV-2 by FDA under an Emergency Use Authorization (EUA). This EUA will remain  in effect (meaning this test can be used) for the duration of the COVID-19 declaration under Se ction 564(b)(1) of the Act, 21 U.S.C. section 360bbb-3(b)(1), unless the authorization is terminated or revoked sooner.  Performed at [facility], [place] [address] [place], [place] [address]       Studies: No results found.   Scheduled Meds: . amLODipine  10 mg Oral Daily  . aspirin EC  81 mg Oral Daily  . atorvastatin  40 mg Oral Daily  . carvedilol  25 mg Oral BID WC  . clopidogrel  75 mg Oral Daily  . enoxaparin (LOVENOX) injection  40 mg Subcutaneous Q24H  . insulin aspart  0-15 Units Subcutaneous TID WC  . nicotine  21 mg Transdermal Daily  . thiamine  100 mg Oral Daily   Or  . thiamine  100 mg Intravenous Daily   Continuous Infusions:  Principal Problem:   Acute CVA (cerebrovascular accident) ([place]) Active Problems:   Hypertension associated with diabetes ([place])   Type 2 diabetes mellitus without complication ([place])   Hyperlipidemia associated with type 2 diabetes mellitus ([place])   Alcohol use   AKI (acute kidney injury) ([place])   Tobacco use   Tobacco abuse   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 ([place])     Gregory Nunez  Tublu Rhylie Stehr, [facility]  If 7PM-7AM, please contact night-coverage [email or web address] Password [facility] 05/02/2020, 6:26 PM    LOS: 8 days

## 2020-05-03 DIAGNOSIS — I10 Essential (primary) hypertension: Secondary | ICD-10-CM | POA: Diagnosis not present

## 2020-05-03 LAB — GLUCOSE, CAPILLARY
Glucose-Capillary: 148 mg/dL — ABNORMAL HIGH (ref 70–99)
Glucose-Capillary: 259 mg/dL — ABNORMAL HIGH (ref 70–99)
Glucose-Capillary: 233 mg/dL — ABNORMAL HIGH (ref 70–99)
Glucose-Capillary: 213 mg/dL — ABNORMAL HIGH (ref 70–99)

## 2020-05-03 MED ORDER — SENNOSIDES-DOCUSATE SODIUM 8.6-50 MG PO TABS
1.0000 | ORAL_TABLET | Freq: Every day | ORAL | Status: DC
  Administered 2020-05-03: 1 via ORAL
  Filled 2020-05-03: qty 1

## 2020-05-03 MED ORDER — POLYETHYLENE GLYCOL 3350 17 G PO PACK
17.0000 g | PACK | Freq: Every day | ORAL | Status: DC
  Administered 2020-05-03 – 2020-05-08 (×6): 17 g via ORAL
  Filled 2020-05-03 (×5): qty 1

## 2020-05-03 NOTE — Progress Notes (Addendum)
PROGRESS NOTE    Gregory Nunez.  VWU:[id number]  DOB: 1945/04/22  DOA: 04/23/2020 PCP: [name], MD Outpatient Specialists:   Hospital course:  Gregory Nunez. is a 75 year old male with past medical history significant for type 2 diabetes mellitus with diabetic retinopathy, essential hypertension, hyperlipidemia, prostate cancer and alcohol use disorder who presented to the ED with right-sided weakness.  Patient was found wandering around a local [facility]S and EMS was activated with notable right-sided facial droop and altered mental status.  Upon chart review, concern for excessive alcohol abuse from prior presentations. CT head without contrast with new hypodensity in left paramedian pons consistent with acute infarction and ill-defined hypodensity left lenticular nuclear suspicious for subacute infarct.  Neurology was consulted.  Patient received 325 mg aspirin and 1 L NS bolus in the ED.  Also received Librium and Ativan as needed per CIWA protocol for concern of EtOH withdrawal.    Patient was admitted for treatment of acute CVA and possible alcohol withdrawal.   Subjective:  Patient's main concern is ongoing constipation.  Notes he had a small bowel movement yesterday but otherwise it had been several days before he had moved his bowels   Objective: Vitals:   05/03/20 0432 05/03/20 0904 05/03/20 1216 05/03/20 1509  BP: (!) 144/72 126/61 122/63 113/71  Pulse:  83 81 87  Resp: 20 17 18 17   Temp: 98.2 F (36.8 C) 98.4 F (36.9 C) 98.2 F (36.8 C) 97.8 F (36.6 C)  TempSrc: Oral Oral Oral Oral  SpO2: 98% 99% 100% 100%  Weight:      Height:        Intake/Output Summary (Last 24 hours) at 05/03/2020 1637 Last data filed at 05/03/2020 0902 Gross per 24 hour  Intake 476 ml  Output 800 ml  Net -324 ml   Filed Weights   04/23/20 1743  Weight: 84.4 kg     Exam:  General: Patient resting comfortably in stretcher, no acute distress Eyes: sclera  anicteric, conjuctiva mild injection bilaterally CVS: S1-S2, regular  Respiratory:  decreased air entry bilaterally secondary to decreased inspiratory effort, rales at bases  GI: NABS, soft, NT  LE: No edema.  Psych:  mood and affect appropriate to situation.   Assessment & Plan:    75 year old man status post CVA is awaiting placement at SNF and rehab.  Constipation We will add senna 1 tablet at bedtime and MiraLAX daily  DM2 Blood sugar was somewhat elevated earlier today, can consider adding Lantus if they remain elevated. In the meanwhile continue SSI AC at bedtime Hopefully this means that patient is eating better. 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 Plan is for DAPT with aspirin and Plavix for 3 weeks followed by aspirin alone DAPT was started 04/24/2020 and will be completed on 05/15/2020. Patient can start on aspirin alone on 05/15/2020. MR shows 3 mm acute ischemic nonhemorrhagic infarct of the left basal ganglia and 5 mm subacute infarct right periatrial white matter. 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 Patient scored 2 out  of 10 on the [facility] which correlates to cognitive limitations that are required for safe completion of medication management and cooking. Patient seen by psychiatry in 04/25/2020 and was deemed to have capacity for medical decision-making.    DVT prophylaxis: Lovenox Code Status: Full Family Communication: None Disposition Plan:   Patient is from: Home  Anticipated  Discharge Location: SNF  Barriers to Discharge: Awaiting SNF bed  Is patient medically stable for Discharge: Yes   Consultants:  Neurology  Psychiatry  Procedures:  TTE  Antimicrobials:  None   Data Reviewed:  Basic Metabolic Panel: Recent Labs  Lab 04/27/20 0232 04/29/20 0456 05/02/20 0437  NA 142 138 138  K 3.6 3.6 3.5  CL 110 108 107  CO2 21* 18* 23  GLUCOSE 123* 147* 146*  BUN 26* 30* 35*  CREATININE 1.46* 1.41* 1.35*  CALCIUM 9.0 8.9 8.9   Liver Function Tests: No results for input(s): AST, ALT, ALKPHOS, BILITOT, PROT, ALBUMIN in the last 168 hours. No results for input(s): LIPASE, AMYLASE in the last 168 hours. No results for input(s): AMMONIA in the last 168 hours. CBC: No results for input(s): WBC, NEUTROABS, HGB, HCT, MCV, PLT in the last 168 hours. Cardiac Enzymes: No results for input(s): CKTOTAL, CKMB, CKMBINDEX, TROPONINI in the last 168 hours. BNP (last 3 results) No results for input(s): PROBNP in the last 8760 hours. CBG: Recent Labs  Lab 05/02/20 1600 05/02/20 2135 05/03/20 0626 05/03/20 1221 05/03/20 1625  GLUCAP 179* 195* 148* 259* 233*    Recent Results (from the past 240 hour(s))  Resp Panel by RT-PCR (Flu A&B, Covid) Nasopharyngeal Swab     Status: None   Collection Time: 04/23/20  7:25 PM   Specimen: Nasopharyngeal Swab; Nasopharyngeal(NP) swabs in vial transport medium  Result Value Ref Range Status   SARS Coronavirus 2 by RT PCR NEGATIVE NEGATIVE Final    Comment: (NOTE) SARS-CoV-2 target nucleic acids are NOT DETECTED.  The SARS-CoV-2 RNA is generally detectable in upper respiratory specimens during the acute phase of infection. The lowest concentration of SARS-CoV-2 viral copies this assay can detect is 138 copies/mL. A negative result does not preclude SARS-Cov-2 infection and should not be used as the sole basis for treatment or other patient management decisions. A negative result may occur with  improper specimen  collection/handling, submission of specimen other than nasopharyngeal swab, presence of viral mutation(s) within the areas targeted by this assay, and inadequate number of viral copies(<138 copies/mL). A negative result must be combined with clinical observations, patient history, and epidemiological information. The expected result is Negative.  Fact Sheet for Patients:  EntrepreneurPulse.com.au  Fact Sheet for Healthcare Providers:  IncredibleEmployment.be  This test is no t yet approved or cleared by the [place] FDA and  has been authorized for detection and/or diagnosis of SARS-CoV-2 by FDA under an Emergency Use Authorization (EUA). This EUA will remain  in effect (meaning this test can be used) for the duration of the COVID-19 declaration under Section 564(b)(1) of the Act, 21 U.S.C.section 360bbb-3(b)(1), unless the authorization is terminated  or revoked sooner.       Influenza A by PCR NEGATIVE NEGATIVE Final   Influenza B by PCR NEGATIVE NEGATIVE Final    Comment: (NOTE) The Xpert Xpress SARS-CoV-2/FLU/RSV plus assay is intended as an aid in the diagnosis of influenza from Nasopharyngeal swab specimens and should not be used as a sole basis for treatment. Nasal washings and aspirates are unacceptable for Xpert Xpress SARS-CoV-2/FLU/RSV testing.  Fact Sheet for Patients: EntrepreneurPulse.com.au  Fact Sheet for Healthcare Providers: IncredibleEmployment.be  This test is not yet approved or cleared by the [place] FDA and has been authorized for detection and/or diagnosis of SARS-CoV-2 by FDA under an Emergency Use Authorization (EUA). This EUA will remain in effect (meaning this test can be used) for the duration of the COVID-19 declaration under Section 564(b)(1) of the Act, 21 U.S.C. section 360bbb-3(b)(1), unless the authorization is terminated or revoked.  Performed at [facility], [place] [address], [place], [place] [address]   SARS CORONAVIRUS 2 (TAT 6-24 HRS) Nasopharyngeal Nasopharyngeal Swab     Status: None   Collection Time: 04/27/20 11:58 AM   Specimen: Nasopharyngeal Swab  Result Value Ref Range Status   SARS Coronavirus 2 NEGATIVE NEGATIVE Final    Comment: (NOTE) SARS-CoV-2 target nucleic acids are NOT DETECTED.  The SARS-CoV-2 RNA is generally detectable in upper and lower respiratory specimens during the acute phase of infection. Negative results do not preclude SARS-CoV-2 infection, do not rule out co-infections with other pathogens, and should not be used as the sole basis for treatment or other patient management decisions. Negative results must be combined with clinical observations, patient history, and epidemiological information. The expected result is Negative.  Fact Sheet for Patients: SugarRoll.be  Fact Sheet for Healthcare Providers: https://www.woods-mathews.com/  This test is not yet approved or cleared by the [place] FDA and  has been authorized for detection and/or diagnosis of SARS-CoV-2 by FDA under an Emergency Use Authorization (EUA). This EUA will remain  in effect (meaning this test can be used) for the duration of the COVID-19 declaration under Se ction 564(b)(1) of the Act, 21 U.S.C. section 360bbb-3(b)(1), unless the authorization is terminated or revoked sooner.  Performed at [facility], [place] [address] [place], [place] [address]       Studies: No results found.   Scheduled Meds: . amLODipine  10 mg Oral Daily  . aspirin EC  81 mg Oral Daily  . atorvastatin  40 mg Oral Daily  . carvedilol  25 mg Oral BID WC  . clopidogrel  75 mg Oral Daily  . enoxaparin (LOVENOX) injection  40 mg Subcutaneous Q24H  . insulin aspart  0-15 Units Subcutaneous TID WC  . nicotine  21 mg Transdermal Daily  . thiamine  100 mg Oral Daily   Or  . thiamine  100 mg  Intravenous Daily   Continuous Infusions:  Principal Problem:   Acute CVA (cerebrovascular accident) ([place]) Active Problems:   Hypertension associated with diabetes ([place])   Type 2 diabetes mellitus without complication ([place])   Hyperlipidemia associated with type 2 diabetes mellitus ([place])   Alcohol use   AKI (acute kidney injury) ([place])   Tobacco use   Tobacco abuse   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 ([place])     Gregory Nunez, [facility]  If 7PM-7AM, please contact night-coverage [email or web address] Password [facility] 05/03/2020, 4:37 PM    LOS: 9 days

## 2020-05-03 NOTE — Progress Notes (Signed)
Physical Therapy Treatment Patient Details Name: Gregory Nunez. MRN: [id number] DOB: Jan 27, 1946 Today's Date: 05/03/2020    History of Present Illness Pt is a 75 year old male with a medical hx significant for dyspnea, alcohol use, prostate cancer, pancreatitis, HTN, HAV, DM2, and depression who presented with R-sided weakness, R-sided facial droop, and AMS when found wandering around a local [facility]. 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 They also showed superimposed remote lacunar infarcts involving the L paramedian pons and posterior R frontoparietal corona radiata. NIHSS of 3 and tPA not administered    PT Comments    Today's skilled session continued to focus on mobility progression. Limited to bedside due to no second person assistance available. The pt continues to need cues for safety with mobility. Acute PT to continue during pt's hospital stay.    Follow Up Recommendations  Supervision/Assistance - 24 hour;CIR (if does not qualify for CIR, then SNF)     Equipment Recommendations  Rolling walker with 5" wheels;3in1 (PT)    Recommendations for Other Services Rehab consult     Precautions / Restrictions Precautions Precautions: Fall Precaution Comments: monitor BP (HTN and orthostatic hypotension) Restrictions Weight Bearing Restrictions: No    Mobility  Bed Mobility Overal bed mobility: Needs Assistance Bed Mobility: Supine to Sit     Supine to sit: Min guard;HOB elevated     General bed mobility comments: with HOB ~35 degrees, cues needed on sequencing with increased time and effort needed.  Transfers Overall transfer level: Needs assistance Equipment used: 1 person hand held assist Transfers: Sit to/from [facility] Sit to Stand: Min assist Stand pivot transfers: Mod assist       General transfer  comment: cues for hand placement and weight shifting for sit<>stand transfers. stand pivot transfer from bed to chair with mod assist with left knee blocked due to bucking and ataxic movements. Once seated in recliner pt was able to scoot self back in the chair.   Modified Rankin (Stroke Patients Only) Modified Rankin (Stroke Patients Only) Pre-Morbid Rankin Score: No symptoms Modified Rankin: Moderately severe disability     Balance   Sitting-balance support: Feet supported;No upper extremity supported Sitting balance-Leahy Scale: Fair Sitting balance - Comments: no UE support, min guard for safety sitting EOB with total assist to change gowns. pt also performed heel raises for 10 reps each side, then long arc quads for 5 reps each side with min active assistm, cues to task throughout.   Standing balance support: Bilateral upper extremity supported;During functional activity Standing balance-Leahy Scale: Poor Standing balance comment: pt tended to lean posterior, needed cues for posture.            Cognition Arousal/Alertness: Awake/alert Behavior During Therapy: WFL for tasks assessed/performed Overall Cognitive Status: Impaired/Different from baseline Area of Impairment: Attention;Following commands;Safety/judgement;Memory;Problem solving;Awareness                   Current Attention Level: Sustained Memory: Decreased short-term memory Following Commands: Follows one step commands consistently;Follows one step commands with increased time;Follows multi-step commands inconsistently Safety/Judgement: Decreased awareness of safety;Decreased awareness of deficits Awareness: Emergent Problem Solving: Slow processing;Difficulty sequencing;Requires verbal cues General Comments: cues needed for safety wth mobility with increased time for processing.       Pertinent Vitals/Pain Pain Assessment: No/denies pain Pain Score: 0-No pain     PT Goals (current goals  can now be  found in the care plan section) Acute Rehab PT Goals Patient Stated Goal: to improve PT Goal Formulation: With patient Time For Goal Achievement: 05/08/20 Potential to Achieve Goals: Good Progress towards PT goals: Progressing toward goals    Frequency    Min 4X/week      PT Plan Current plan remains appropriate    AM-PAC PT "6 Clicks" Mobility   Outcome Measure  Help needed turning from your back to your side while in a flat bed without using bedrails?: A Little Help needed moving from lying on your back to sitting on the side of a flat bed without using bedrails?: A Little Help needed moving to and from a bed to a chair (including a wheelchair)?: A Lot Help needed standing up from a chair using your arms (e.g., wheelchair or bedside chair)?: A Little Help needed to walk in hospital room?: A Lot Help needed climbing 3-5 steps with a railing? : Total 6 Click Score: 14    End of Session Equipment Utilized During Treatment: Gait belt Activity Tolerance: Patient tolerated treatment well Patient left: in chair;with call bell/phone within reach;with chair alarm set Nurse Communication: Mobility status PT Visit Diagnosis: Unsteadiness on feet (R26.81);Other abnormalities of gait and mobility (R26.89);Muscle weakness (generalized) (M62.81);Difficulty in walking, not elsewhere classified (R26.2);Other symptoms and signs involving the nervous system (R29.898)     Time: 1030-1046 PT Time Calculation (min) (ACUTE ONLY): 16 min  Charges:  $Therapeutic Activity: 8-22 mins                    Willow Ora, PTA, [facility] Acute Rehab Services Office- [phone] 05/03/20, 11:16 AM  Willow Ora 05/03/2020, 11:09 AM

## 2020-05-04 DIAGNOSIS — I10 Essential (primary) hypertension: Secondary | ICD-10-CM | POA: Diagnosis not present

## 2020-05-04 LAB — GLUCOSE, CAPILLARY
Glucose-Capillary: 133 mg/dL — ABNORMAL HIGH (ref 70–99)
Glucose-Capillary: 230 mg/dL — ABNORMAL HIGH (ref 70–99)
Glucose-Capillary: 175 mg/dL — ABNORMAL HIGH (ref 70–99)
Glucose-Capillary: 183 mg/dL — ABNORMAL HIGH (ref 70–99)

## 2020-05-04 MED ORDER — SENNOSIDES-DOCUSATE SODIUM 8.6-50 MG PO TABS
2.0000 | ORAL_TABLET | Freq: Every day | ORAL | Status: DC
  Administered 2020-05-04 – 2020-05-07 (×4): 2 via ORAL
  Filled 2020-05-04 (×4): qty 2

## 2020-05-04 NOTE — Progress Notes (Signed)
PROGRESS NOTE    Gregory Nunez.  OVZ:[id number]  DOB: [birth year]  DOA: 04/23/2020 PCP: [name], MD Outpatient Specialists:   Hospital course:  Gregory Flirt [place]. is a 75 year old male with past medical history significant for type 2 diabetes mellitus with diabetic retinopathy, essential hypertension, hyperlipidemia, prostate cancer and alcohol use disorder who presented to the ED with right-sided weakness.  Patient was found wandering around a local [facility] and EMS was activated with notable right-sided facial droop and altered mental status.  Upon chart review, concern for excessive alcohol abuse from prior presentations. CT head without contrast with new hypodensity in left paramedian pons consistent with acute infarction and ill-defined hypodensity left lenticular nuclear suspicious for subacute infarct.  Neurology was consulted.  Patient received 325 mg aspirin and 1 L NS bolus in the ED.  Also received Librium and Ativan as needed per CIWA protocol for concern of EtOH withdrawal.    Patient was admitted for treatment of acute CVA and possible alcohol withdrawal.   Subjective:  Patient's without new complaints, still struggling with constipation.   Objective: Vitals:   05/03/20 1947 05/03/20 2347 05/04/20 0403 05/04/20 1020  BP: 132/69 137/73 (!) 155/75 132/66  Pulse: 75 84 88 87  Resp: 18 18 17 16   Temp: 97.7 F (36.5 C) 98.2 F (36.8 C) 98.3 F (36.8 C) 98.3 F (36.8 C)  TempSrc: Oral Oral Axillary Oral  SpO2: 100% 99% 100% 99%  Weight:      Height:        Intake/Output Summary (Last 24 hours) at 05/04/2020 1210 Last data filed at 05/04/2020 0800 Gross per 24 hour  Intake 200 ml  Output 1000 ml  Net -800 ml   Filed Weights   04/23/20 1743  Weight: 84.4 kg     Exam:  General: Patient resting comfortably in stretcher, no acute distress Eyes: sclera anicteric, conjuctiva mild injection bilaterally CVS: S1-S2, regular  Respiratory:   decreased air entry bilaterally secondary to decreased inspiratory effort, rales at bases  GI: NABS, soft, NT  LE: No edema.  Psych:  mood and affect appropriate to situation.   Assessment & Plan:    75 year old man status post CVA is awaiting placement at SNF and rehab.  Constipation Increase senna to 2 tablet at bedtime Continue MiraLAX daily  DM2 Blood sugar is normal in AM and seems to be increasing in the evening Diabetes Coordinator consult placed today In the meanwhile continue SSI AC at bedtime Hopefully this means that patient is eating better. 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 Plan is for DAPT with aspirin and Plavix for 3 weeks followed by aspirin alone DAPT was started 04/24/2020 and will be completed on 05/15/2020. Patient can start on aspirin alone on 05/15/2020. MR shows 3 mm acute ischemic nonhemorrhagic infarct of the left basal ganglia and 5 mm subacute infarct right periatrial white matter. 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 Patient scored 2 out of 10 on the [facility] which correlates to cognitive limitations that are required for safe completion  of medication management and cooking. Patient seen by psychiatry in 04/25/2020 and was deemed to have capacity for medical decision-making.    DVT prophylaxis: Lovenox Code Status: Full Family Communication: None Disposition Plan:   Patient is from: Home  Anticipated Discharge Location: SNF  Barriers to Discharge: Awaiting SNF bed   Is patient medically stable for Discharge: Yes   Consultants:  Neurology  Psychiatry  Procedures:  TTE  Antimicrobials:  None   Data Reviewed:  Basic Metabolic Panel: Recent Labs  Lab 04/29/20 0456 05/02/20 0437  NA 138 138  K 3.6 3.5  CL 108 107  CO2 18* 23  GLUCOSE 147* 146*  BUN 30* 35*  CREATININE 1.41* 1.35*  CALCIUM 8.9 8.9   Liver Function Tests: No results for input(s): AST, ALT, ALKPHOS, BILITOT, PROT, ALBUMIN in the last 168 hours. No results for input(s): LIPASE, AMYLASE in the last 168 hours. No results for input(s): AMMONIA in the last 168 hours. CBC: No results for input(s): WBC, NEUTROABS, HGB, HCT, MCV, PLT in the last 168 hours. Cardiac Enzymes: No results for input(s): CKTOTAL, CKMB, CKMBINDEX, TROPONINI in the last 168 hours. BNP (last 3 results) No results for input(s): PROBNP in the last 8760 hours. CBG: Recent Labs  Lab 05/03/20 1221 05/03/20 1625 05/03/20 2143 05/04/20 0619 05/04/20 1147  GLUCAP 259* 233* 213* 133* 230*    Recent Results (from the past 240 hour(s))  SARS CORONAVIRUS 2 (TAT 6-24 HRS) Nasopharyngeal Nasopharyngeal Swab     Status: None   Collection Time: 04/27/20 11:58 AM   Specimen: Nasopharyngeal Swab  Result Value Ref Range Status   SARS Coronavirus 2 NEGATIVE NEGATIVE Final    Comment: (NOTE) SARS-CoV-2 target nucleic acids are NOT DETECTED.  The SARS-CoV-2 RNA is generally detectable in upper and lower respiratory specimens during the acute phase of infection. Negative results do not preclude SARS-CoV-2 infection, do not rule out co-infections with other pathogens, and should not be used as the sole basis for treatment or other patient management decisions. Negative results must be combined with clinical observations, patient history, and epidemiological information. The expected result is Negative.  Fact Sheet for Patients: SugarRoll.be  Fact Sheet for Healthcare  Providers: https://www.woods-mathews.com/  This test is not yet approved or cleared by the [place] FDA and  has been authorized for detection and/or diagnosis of SARS-CoV-2 by FDA under an Emergency Use Authorization (EUA). This EUA will remain  in effect (meaning this test can be used) for the duration of the COVID-19 declaration under Se ction 564(b)(1) of the Act, 21 U.S.C. section 360bbb-3(b)(1), unless the authorization is terminated or revoked sooner.  Performed at [facility], [place] [address] [place],  [address]       Studies: No results found.   Scheduled Meds: . amLODipine  10 mg Oral Daily  . aspirin EC  81 mg Oral Daily  . atorvastatin  40 mg Oral Daily  . carvedilol  25 mg Oral BID WC  . clopidogrel  75 mg Oral Daily  . enoxaparin (LOVENOX) injection  40 mg Subcutaneous Q24H  . insulin aspart  0-15 Units Subcutaneous TID WC  . nicotine  21 mg Transdermal Daily  . polyethylene glycol  17 g Oral Daily  . senna-docusate  1 tablet Oral QHS  . thiamine  100 mg Oral Daily   Or  . thiamine  100 mg Intravenous Daily   Continuous Infusions:  Principal Problem:   Acute CVA (cerebrovascular accident) [facility]) Active Problems:  Hypertension associated with diabetes ([place])   Type 2 diabetes mellitus without complication ([place])   Hyperlipidemia associated with type 2 diabetes mellitus ([place])   Alcohol use   AKI (acute kidney injury) ([place])   Tobacco use   Tobacco abuse   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 ([place])     Dadrian Ballantine Derek Jack, [facility]  If 7PM-7AM, please contact night-coverage [email or web address] Password [email or web address] 05/04/2020, 12:10 PM    LOS: 10 days

## 2020-05-04 NOTE — Progress Notes (Signed)
Inpatient Diabetes Program Recommendations  AACE/ADA: New Consensus Statement on Inpatient Glycemic Control (2015)  Target Ranges:  Prepandial:   less than 140 mg/dL      Peak postprandial:   less than 180 mg/dL (1-2 hours)      Critically ill patients:  140 - 180 mg/dL   Lab Results  Component Value Date   GLUCAP 230 (H) 05/04/2020   HGBA1C 6.5 (H) 04/24/2020    Review of Glycemic Control  Diabetes history: DM2 Outpatient Diabetes medications: metformin 500 mg bid Current orders for Inpatient glycemic control: Novolog 0-15 units tidwc  HgbA1C - 6.5% Post-prandials elevated > 180 mg/dL May benefit from addition of mc insulin  Inpatient Diabetes Program Recommendations:     Add Novolog 2 units tidwc if eating > 50% meal  Continue to follow throughout inpatient stay.  Thank you. Lorenda Peck, RD, LDN, CDE Inpatient Diabetes Coordinator [phone]

## 2020-05-05 LAB — GLUCOSE, CAPILLARY
Glucose-Capillary: 163 mg/dL — ABNORMAL HIGH (ref 70–99)
Glucose-Capillary: 247 mg/dL — ABNORMAL HIGH (ref 70–99)
Glucose-Capillary: 184 mg/dL — ABNORMAL HIGH (ref 70–99)

## 2020-05-05 NOTE — Progress Notes (Signed)
  Speech Language Pathology Treatment: Dysphagia  Patient Details Name: Gregory Nunez. MRN: [id number] DOB: 02-Feb-1946 Today's Date: 05/05/2020 Time: 1348-1400 SLP Time Calculation (min) (ACUTE ONLY): 12 min  Assessment / Plan / Recommendation Clinical Impression  Pt was seen for treatment. He was alert and cooperative during the session, but reported fatigue. He exhibited increasing difficulty maintaining attention and alertness as the session progressed and the it was abbreviated for this  reason. He was seen with his lunch tray and initially refused p.o. intake, but was amenable to having some once his dentures were cleaned and placed. He consumed a meal of meatloaf and mac and cheese. No s/sx of aspiration were noted with solids or liquids. Mastication was mildly increased, but functional. Mild-moderate oral residue was noted which was inconsistently cleared with secondary swallows. Pt refused additional p.o. intake after a few boluses stating, "I think I'll like to have it later; I'm not really hungry." Pt's current diet may be continued and SLP will follow.    HPI HPI: 75 y.o. male with medical history significant for type 2 diabetes with diabetic retinopathy, hypertension, hyperlipidemia, prostate cancer, and alcohol use who presents to the ED for evaluation of right-sided weakness.  04/23/20 MRI head indicated 3 cm acute ischemic nonhemorrhagic infarct involving the left  basal ganglia, corresponding with abnormality on prior CT.2. Additional 5 mm subacute ischemic nonhemorrhagic right periatrial  white matter infarct,      SLP Plan  Continue with current plan of care       Recommendations  Liquids provided via: Cup;No straw Medication Administration: Whole meds with puree Supervision: Patient able to self feed;Intermittent supervision to cue for compensatory strategies Compensations: Slow rate;Small sips/bites;Minimize environmental distractions;Lingual sweep for clearance of  pocketing                Oral Care Recommendations: Oral care BID Follow up Recommendations: Other (comment) (TBD) SLP Visit Diagnosis: Dysphagia, oropharyngeal phase (R13.12);Dysarthria and anarthria (R47.1);Attention and concentration deficit;Cognitive communication deficit (R41.841) Attention and concentration deficit following: Cerebral infarction Plan: Continue with current plan of care       Soha Thorup I. Hardin Negus, [place], [place] Office number [phone] Pager [place] 05/05/2020, 5:28 PM

## 2020-05-05 NOTE — Progress Notes (Signed)
Progress Note    Gregory Nunez.  XH:[id number] DOB: 1945/12/17  DOA: 04/23/2020 PCP: [name], MD      Brief Narrative:    Medical records reviewed and are as summarized below:  Gregory Pagan. is a 75 y.o. male with past medical history significant for type 2 diabetes mellitus with diabetic retinopathy, essential hypertension, hyperlipidemia, prostate cancer and alcohol use disorder who presented to the ED with right-sided weakness. Patient was found wandering around a local [facility] and EMS was activated with notable right-sided facial droop and altered mental status.  Upon chart review, concern for excessive alcohol abuse from prior presentations. CT head without contrast with new hypodensity in left paramedian pons consistent with acute infarction and ill-defined hypodensity left lenticular nuclear suspicious for subacute infarct. Neurology was consulted. Patient received 325 mg aspirin and 1 L NS bolus in the ED. Also received Librium and Ativan as needed per CIWA protocol for concern of EtOH withdrawal.   Patient was admitted for treatment of acute CVA and possible alcohol withdrawal.       Assessment/Plan:   Principal Problem:   Acute CVA (cerebrovascular accident) ([place]) Active Problems:   Hypertension associated with diabetes ([place])   Type 2 diabetes mellitus without complication ([place])   Hyperlipidemia associated with type 2 diabetes mellitus ([place])   Alcohol use   AKI (acute kidney injury) ([place])   Tobacco use   Tobacco abuse   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 ([place])   Body mass index is 25.95 kg/m.       Constipation Increase senna to 2 tablet at bedtime Continue MiraLAX daily  DM2 Blood sugar is normal in AM and seems to be increasing in the evening Diabetes Coordinator consult placed today In the meanwhile continue SSI AC at bedtime Hopefully this means that patient is eating better. 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 Plan is for DAPT with aspirin and Plavix for 3 weeks followed by aspirin alone DAPT was started 04/24/2020 and will be completed on 05/15/2020. Patient can start on aspirin alone on 05/15/2020. MR shows 3 mm acute ischemic nonhemorrhagic infarct of the left basal ganglia and 5 mm subacute infarct right periatrial white matter. 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 Awaiting placement to SNF.  AKI Creatinine improving.  Monitor BMP.  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 Patient scored 2 out of 10 on the [facility] which correlates to cognitive limitations that are required for safe completion of medication management and cooking. Patient seen by psychiatry in 04/25/2020 and was deemed to have capacity for medical decision-making.              Diet Order            DIET DYS 3 Room service appropriate? Yes; Fluid consistency: Thin  Diet effective now           Diet - low sodium heart healthy                    Consultants:  Neurologist  Physiatrist  Procedures:  None    Medications:   . amLODipine  10 mg Oral Daily  . aspirin EC  81 mg Oral Daily  . atorvastatin  40 mg Oral Daily  . carvedilol  25 mg Oral BID WC  . clopidogrel  75 mg Oral Daily  . enoxaparin (LOVENOX) injection  40 mg Subcutaneous Q24H  . insulin aspart  0-15 Units Subcutaneous TID WC  . nicotine  21 mg Transdermal Daily  . polyethylene glycol  17 g Oral Daily  . senna-docusate  2 tablet Oral QHS  . thiamine  100 mg Oral Daily   Or  . thiamine  100 mg Intravenous Daily   Continuous Infusions:   Anti-infectives (From  admission, onward)   None             Family Communication/Anticipated D/C date and plan/Code Status   DVT prophylaxis: enoxaparin (LOVENOX) injection 40 mg Start: 04/24/20 2000     Code Status: Full Code  Family Communication: None Disposition Plan:    Status is: Inpatient  Remains inpatient appropriate because:Unsafe d/c plan   Dispo:  Patient From: Home  Planned Disposition: [place]  Expected discharge date: 05/06/2020  Medically stable for discharge: Yes            Subjective:   Interval events noted.  No new complaints.  Objective:    Vitals:   05/05/20 0000 05/05/20 0400 05/05/20 0800 05/05/20 1200  BP: (!) 129/59 136/82 (!) 152/67 115/74  Pulse: 90 88 89 78  Resp: 18 18 16 16   Temp: 98.9 F (37.2 C) 98.7 F (37.1 C) 98 F (36.7 C) 98.1 F (36.7 C)  TempSrc: Oral Oral Oral Oral  SpO2: 98% 98% 99% 99%  Weight:      Height:       No data found.  No intake or output data in the 24 hours ending 05/05/20 1542 Filed Weights   04/23/20 1743  Weight: 84.4 kg    Exam:  GEN: NAD SKIN: No rash EYES: EOMI ENT: MMM CV: RRR PULM: CTA B ABD: soft, ND, NT, +BS CNS: AAO x 3, right facial droop, slurred speech.  No deficits noted in upper and lower extremities. EXT: No edema or tenderness   Data Reviewed:   I have personally reviewed following labs and imaging studies:  Labs: Labs show the following:   Basic Metabolic Panel: Recent Labs  Lab 04/29/20 0456 05/02/20 0437  NA 138 138  K 3.6 3.5  CL 108 107  CO2 18* 23  GLUCOSE 147* 146*  BUN 30* 35*  CREATININE 1.41* 1.35*  CALCIUM 8.9 8.9   GFR Estimated Creatinine Clearance: 52.7 mL/min (A) (by C-G formula based on SCr of 1.35 mg/dL (H)). Liver Function Tests: No results for input(s): AST, ALT, ALKPHOS, BILITOT, PROT, ALBUMIN in the last 168 hours. No results for input(s): LIPASE, AMYLASE in the last 168 hours. No results for input(s): AMMONIA in the  last 168 hours. Coagulation profile No results for input(s): INR, PROTIME in the last 168 hours.  CBC: No results for input(s): WBC, NEUTROABS, HGB, HCT, MCV, PLT in the last 168 hours. Cardiac Enzymes: No results for input(s): CKTOTAL, CKMB, CKMBINDEX, TROPONINI in the last 168 hours. BNP (last 3 results) No results for input(s): PROBNP in the last 8760 hours. CBG: Recent Labs  Lab 05/04/20 1147 05/04/20 1629 05/04/20 1950 05/05/20 0834 05/05/20 1159  GLUCAP 230* 175* 183* 163* 247*   D-Dimer: No results for input(s): DDIMER in the last 72 hours. Hgb A1c: No results for input(s): HGBA1C in the last 72 hours. Lipid Profile: No results for input(s):  CHOL, HDL, LDLCALC, TRIG, CHOLHDL, LDLDIRECT in the last 72 hours. Thyroid function studies: No results for input(s): TSH, T4TOTAL, T3FREE, THYROIDAB in the last 72 hours.  Invalid input(s): FREET3 Anemia work up: No results for input(s): VITAMINB12, FOLATE, FERRITIN, TIBC, IRON, RETICCTPCT in the last 72 hours. Sepsis Labs: No results for input(s): PROCALCITON, WBC, LATICACIDVEN in the last 168 hours.  Microbiology Recent Results (from the past 240 hour(s))  SARS CORONAVIRUS 2 (TAT 6-24 HRS) Nasopharyngeal Nasopharyngeal Swab     Status: None   Collection Time: 04/27/20 11:58 AM   Specimen: Nasopharyngeal Swab  Result Value Ref Range Status   SARS Coronavirus 2 NEGATIVE NEGATIVE Final    Comment: (NOTE) SARS-CoV-2 target nucleic acids are NOT DETECTED.  The SARS-CoV-2 RNA is generally detectable in upper and lower respiratory specimens during the acute phase of infection. Negative results do not preclude SARS-CoV-2 infection, do not rule out co-infections with other pathogens, and should not be used as the sole basis for treatment or other patient management decisions. Negative results must be combined with clinical observations, patient history, and epidemiological information. The expected result is Negative.  Fact  Sheet for Patients: SugarRoll.be  Fact Sheet for Healthcare Providers: https://www.woods-mathews.com/  This test is not yet approved or cleared by the [place] FDA and  has been authorized for detection and/or diagnosis of SARS-CoV-2 by FDA under an Emergency Use Authorization (EUA). This EUA will remain  in effect (meaning this test can be used) for the duration of the COVID-19 declaration under Se ction 564(b)(1) of the Act, 21 U.S.C. section 360bbb-3(b)(1), unless the authorization is terminated or revoked sooner.  Performed at [facility], [place] [address] [place], [place] [address]     Procedures and diagnostic studies:  No results found.             LOS: 11 days   [place] Copy[employer], advertising on [email or web address]. If 7PM-7AM, please contact night-coverage at [email or web address]     05/05/2020, 3:42 PM

## 2020-05-05 NOTE — Plan of Care (Signed)
  Problem: Education: Goal: Knowledge of patient specific risk factors addressed and post discharge goals established will improve Outcome: Progressing   Problem: Health Behavior/Discharge Planning: Goal: Ability to manage health-related needs will improve Outcome: Progressing   Problem: Clinical Measurements: Goal: Will remain free from infection Outcome: Progressing   Problem: Activity: Goal: Risk for activity intolerance will decrease Outcome: Progressing   Problem: Coping: Goal: Level of anxiety will decrease Outcome: Progressing   Problem: Pain Managment: Goal: General experience of comfort will improve Outcome: Progressing

## 2020-05-05 NOTE — Progress Notes (Signed)
Physical Therapy Treatment Patient Details Name: Gregory Nunez. MRN: [id number] DOB: 1946-03-02 Today's Date: 05/05/2020    History of Present Illness Pt is a 75 year old male with a medical hx significant for dyspnea, alcohol use, prostate cancer, pancreatitis, HTN, HAV, DM2, and depression who presented with R-sided weakness, R-sided facial droop, and AMS when found wandering around a local [facility]. 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 They also showed superimposed remote lacunar infarcts involving the L paramedian pons and posterior R frontoparietal corona radiata. NIHSS of 3 and tPA not administered    PT Comments    Patient continues with decr balance (incr time working on finding midline in sitting at EOB and in chair). Patient with posterior lean when coming to stand and did better with RW in front of pt to assist with forward wt-shift as coming to stand. Continues with slow, delayed processing for all cues requiring incr time.     Follow Up Recommendations  Supervision/Assistance - 24 hour;CIR (if does not qualify for CIR, then SNF)     Equipment Recommendations  Rolling walker with 5" wheels;3in1 (PT)    Recommendations for Other Services       Precautions / Restrictions Precautions Precautions: Fall Precaution Comments: monitor BP (HTN and orthostatic hypotension)    Mobility  Bed Mobility Overal bed mobility: Needs Assistance Bed Mobility: Supine to Sit     Supine to sit: Min guard;HOB elevated     General bed mobility comments: with HOB ~35 degrees, cues needed on sequencing with increased time and effort needed.  Transfers Overall transfer level: Needs assistance Equipment used: Rolling walker (2 wheeled);None Transfers: Sit to/from Gregory Nunez Sit to Stand: Min assist   Squat pivot transfers: Min  assist     General transfer comment: cues for hand placement and weight shifting for sit<>stand transfers. Attempted stand pivot transfer from bed to chair towards his left, however pt would not fully stand and instead performed squat-pivot. Once seated in recliner pt was able to scoot self back in the chair. Repeated sit to stand from recliner to RW with significant posterior lean  Ambulation/Gait             General Gait Details: no 2nd person for safe gait training   Stairs             Wheelchair Mobility    Modified Rankin (Stroke Patients Only) Modified Rankin (Stroke Patients Only) Pre-Morbid Rankin Score: No symptoms Modified Rankin: Moderately severe disability     Balance Overall balance assessment: Needs assistance Sitting-balance support: Feet supported;No upper extremity supported Sitting balance-Leahy Scale: Poor Sitting balance - Comments: increased rt lean Postural control: Posterior lean;Right lateral lean Standing balance support: Bilateral upper extremity supported;During functional activity Standing balance-Leahy Scale: Poor Standing balance comment: pt tended to lean posterior, needed cues for posture.                            Cognition Arousal/Alertness: Awake/alert Behavior During Therapy: WFL for tasks assessed/performed Overall Cognitive Status: Impaired/Different from baseline Area of Impairment: Attention;Following commands;Safety/judgement;Memory;Problem solving;Awareness                 Orientation Level:  (oriented to person, day of week; otherwise NT) Current Attention Level: Sustained   Following Commands: Follows one step commands consistently;Follows one step commands with  increased time;Follows multi-step commands inconsistently Safety/Judgement: Decreased awareness of safety;Decreased awareness of deficits Awareness: Emergent Problem Solving: Slow processing;Difficulty sequencing;Requires verbal  cues General Comments: cues needed for safety wth mobility with increased time for processing.      Exercises General Exercises - Lower Extremity Long Arc Quad: Both;Strengthening;Seated;5 reps Heel Raises: AROM;Both;10 reps;Seated    General Comments        Pertinent Vitals/Pain Pain Assessment: No/denies pain Faces Pain Scale: No hurt    Home Living                      Prior Function            PT Goals (current goals can now be found in the care plan section) Acute Rehab PT Goals Patient Stated Goal: to improve Time For Goal Achievement: 05/08/20 Potential to Achieve Goals: Good Progress towards PT goals: Progressing toward goals    Frequency    Min 4X/week      PT Plan Current plan remains appropriate    Co-evaluation              AM-PAC PT "6 Clicks" Mobility   Outcome Measure  Help needed turning from your back to your side while in a flat bed without using bedrails?: A Little Help needed moving from lying on your back to sitting on the side of a flat bed without using bedrails?: A Little Help needed moving to and from a bed to a chair (including a wheelchair)?: A Little Help needed standing up from a chair using your arms (e.g., wheelchair or bedside chair)?: A Little Help needed to walk in hospital room?: A Lot Help needed climbing 3-5 steps with a railing? : Total 6 Click Score: 15    End of Session Equipment Utilized During Treatment: Gait belt Activity Tolerance: Patient tolerated treatment well Patient left: in chair;with call bell/phone within reach;with chair alarm set Nurse Communication: Mobility status PT Visit Diagnosis: Unsteadiness on feet (R26.81);Other abnormalities of gait and mobility (R26.89);Muscle weakness (generalized) (M62.81);Difficulty in walking, not elsewhere classified (R26.2);Other symptoms and signs involving the nervous system (R29.898)     Time: 4174-0814 PT Time Calculation (min) (ACUTE ONLY): 34  min  Charges:  $Therapeutic Activity: 8-22 mins $Neuromuscular Re-education: 8-22 mins                      Arby Barrette, PT Pager [phone]    [name] 05/05/2020, 9:57 AM

## 2020-05-06 DIAGNOSIS — E785 Hyperlipidemia, unspecified: Secondary | ICD-10-CM

## 2020-05-06 DIAGNOSIS — E1169 Type 2 diabetes mellitus with other specified complication: Secondary | ICD-10-CM

## 2020-05-06 DIAGNOSIS — N179 Acute kidney failure, unspecified: Secondary | ICD-10-CM

## 2020-05-06 LAB — BASIC METABOLIC PANEL
Anion gap: 9 (ref 5–15)
BUN: 42 mg/dL — ABNORMAL HIGH (ref 8–23)
CO2: 24 mmol/L (ref 22–32)
Calcium: 9.5 mg/dL (ref 8.9–10.3)
Chloride: 107 mmol/L (ref 98–111)
Creatinine, Ser: 1.59 mg/dL — ABNORMAL HIGH (ref 0.61–1.24)
GFR, Estimated: 46 mL/min — ABNORMAL LOW (ref >60)
Glucose, Bld: 154 mg/dL — ABNORMAL HIGH (ref 70–99)
Potassium: 4.2 mmol/L (ref 3.5–5.1)
Sodium: 140 mmol/L (ref 135–145)

## 2020-05-06 LAB — GLUCOSE, CAPILLARY
Glucose-Capillary: 183 mg/dL — ABNORMAL HIGH (ref 70–99)
Glucose-Capillary: 179 mg/dL — ABNORMAL HIGH (ref 70–99)
Glucose-Capillary: 200 mg/dL — ABNORMAL HIGH (ref 70–99)
Glucose-Capillary: 189 mg/dL — ABNORMAL HIGH (ref 70–99)

## 2020-05-06 MED ORDER — LACTATED RINGERS IV SOLN: INTRAVENOUS | Status: AC

## 2020-05-06 NOTE — Progress Notes (Signed)
Physical Therapy Treatment Patient Details Name: Gregory Nunez. MRN: [id number] DOB: Nov 17, 1945 Today's Date: 05/06/2020    History of Present Illness Pt is a 75 year old male with a medical hx significant for dyspnea, alcohol use, prostate cancer, pancreatitis, HTN, HAV, DM2, and depression who presented with R-sided weakness, R-sided facial droop, and AMS when found wandering around a local CVS. 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 They also showed superimposed remote lacunar infarcts involving the L paramedian pons and posterior R frontoparietal corona radiata. NIHSS of 3 and tPA not administered    PT Comments    Patient responding to requests/commands more briskly compared to 1/17. Patient did not need physical assist for supine to sit and later return to supine (for RN to replace condom catheter that was leaking). Able to do pre-gait and side-stepping with RW and +1 assist, however with incr lean to his right with fatigue and recommend 2 person assist or chair to follow for gait training.     Follow Up Recommendations  Supervision/Assistance - 24 hour;SNF (no CIR due to lack of 24/7 support)     Equipment Recommendations  Rolling walker with 5" wheels;3in1 (PT)    Recommendations for Other Services       Precautions / Restrictions Precautions Precautions: Fall Precaution Comments: monitor BP (HTN and orthostatic hypotension)    Mobility  Bed Mobility Overal bed mobility: Needs Assistance Bed Mobility: Supine to Sit     Supine to sit: Min guard;HOB elevated Sit to supine: Min guard   General bed mobility comments: with HOB ~35 degrees, cues needed on sequencing with increased time and effort needed.  Transfers Overall transfer level: Needs assistance Equipment used: Rolling walker (2 wheeled);None Transfers: Sit to/from  Stand Sit to Stand: Min assist;Mod assist         General transfer comment: cues for hand placement and weight shifting for sit<>stand transfers. Needed large boost for anterior wt-shift and initiating lift-off x 3 reps  Ambulation/Gait Ambulation/Gait assistance: Mod assist Gait Distance (Feet): 3 Feet Assistive device: Rolling walker (2 wheeled) Gait Pattern/deviations: Decreased weight shift to left;Shuffle;Step-to pattern;Trunk flexed;Decreased step length - right;Decreased step length - left;Decreased stride length;Decreased weight shift to right Gait velocity: reduced   General Gait Details: no 2nd person for safe gait training; sidestepping to his left with increassing rt lean   Stairs             Wheelchair Mobility    Modified Rankin (Stroke Patients Only) Modified Rankin (Stroke Patients Only) Pre-Morbid Rankin Score: No symptoms Modified Rankin: Moderately severe disability     Balance Overall balance assessment: Needs assistance Sitting-balance support: Feet supported;No upper extremity supported Sitting balance-Leahy Scale: Poor Sitting balance - Comments: mild rt lean Postural control: Right lateral lean Standing balance support: Bilateral upper extremity supported;During functional activity Standing balance-Leahy Scale: Poor Standing balance comment: pt tended to lean posterior and right, especially with fatigue, needed cues for posture.                            Cognition Arousal/Alertness: Awake/alert Behavior During Therapy: WFL for tasks assessed/performed Overall Cognitive Status: Impaired/Different from baseline Area of Impairment: Following commands;Safety/judgement;Problem solving;Awareness                 Orientation Level:  (oriented to person, day of week; otherwise NT)  Following Commands: Follows one step commands consistently;Follows one step commands with increased time;Follows multi-step commands  inconsistently Safety/Judgement: Decreased awareness of safety;Decreased awareness of deficits Awareness: Emergent Problem Solving: Slow processing;Difficulty sequencing;Requires verbal cues;Decreased initiation;Requires tactile cues General Comments: verbal and tactile cues needed for safety wth mobility with increased time for processing.      Exercises General Exercises - Lower Extremity Long Arc Quad: Both;Strengthening;Seated;5 reps (RLE improved) Straight Leg Raises: Right;5 reps    General Comments General comments (skin integrity, edema, etc.): Patient's condom catheter came loose and pt incontinent of urine in standing. Returned to sitting. As PT cleaned the floor, pt given washcloth to wash his legs and feet--did safely reaching down to his LEs      Pertinent Vitals/Pain Pain Assessment: No/denies pain Faces Pain Scale: No hurt    Home Living                      Prior Function            PT Goals (current goals can now be found in the care plan section) Acute Rehab PT Goals Patient Stated Goal: to improve Time For Goal Achievement: 05/08/20 Potential to Achieve Goals: Good Progress towards PT goals: Progressing toward goals    Frequency    Min 3X/week      PT Plan Discharge plan needs to be updated;Frequency needs to be updated    Co-evaluation              AM-PAC PT "6 Clicks" Mobility   Outcome Measure  Help needed turning from your back to your side while in a flat bed without using bedrails?: A Little Help needed moving from lying on your back to sitting on the side of a flat bed without using bedrails?: A Little Help needed moving to and from a bed to a chair (including a wheelchair)?: A Lot Help needed standing up from a chair using your arms (e.g., wheelchair or bedside chair)?: A Lot Help needed to walk in hospital room?: A Lot Help needed climbing 3-5 steps with a railing? : Total 6 Click Score: 13    End of Session Equipment  Utilized During Treatment: Gait belt Activity Tolerance: Patient tolerated treatment well Patient left: in chair;with call bell/phone within reach;with chair alarm set Nurse Communication: Mobility status PT Visit Diagnosis: Unsteadiness on feet (R26.81);Other abnormalities of gait and mobility (R26.89);Muscle weakness (generalized) (M62.81);Difficulty in walking, not elsewhere classified (R26.2);Other symptoms and signs involving the nervous system (R29.898)     Time: 9794-8016 PT Time Calculation (min) (ACUTE ONLY): 36 min  Charges:  $Therapeutic Activity: 23-37 mins                      Arby Barrette, PT Pager [phone]    [name] 05/06/2020, 5:26 PM

## 2020-05-06 NOTE — Progress Notes (Signed)
Progress Note    Gregory Nunez.  GHW:[id number] DOB: 1946/04/08  DOA: 04/23/2020 PCP: [name], MD      Brief Narrative:    Medical records reviewed and are as summarized below:  Gregory Pagan. is a 75 y.o. male with past medical history significant for type 2 diabetes mellitus with diabetic retinopathy, essential hypertension, hyperlipidemia, prostate cancer and alcohol use disorder who presented to the ED with right-sided weakness. Patient was found wandering around a local [facility] and EMS was activated with notable right-sided facial droop and altered mental status.  Upon chart review, concern for excessive alcohol abuse from prior presentations. CT head without contrast with new hypodensity in left paramedian pons consistent with acute infarction and ill-defined hypodensity left lenticular nuclear suspicious for subacute infarct. Neurology was consulted. Patient received 325 mg aspirin and 1 L NS bolus in the ED. Also received Librium and Ativan as needed per CIWA protocol for concern of EtOH withdrawal.   Patient was admitted for treatment of acute CVA and possible alcohol withdrawal.       Assessment/Plan:   Principal Problem:   Acute CVA (cerebrovascular accident) ([place]) Active Problems:   Hypertension associated with diabetes ([place])   Type 2 diabetes mellitus without complication ([place])   Hyperlipidemia associated with type 2 diabetes mellitus ([place])   Alcohol use   AKI (acute kidney injury) ([place])   Tobacco use   Tobacco abuse   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 ([place])   Body mass index is 25.95 kg/m.     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 Plan is for DAPT with aspirin and Plavix for 3 weeks followed by aspirin alone DAPT was started 04/24/2020 and will be completed on 05/15/2020. Patient can start on aspirin alone on 05/15/2020. MR shows 3 mm acute ischemic nonhemorrhagic infarct of the left basal ganglia and 5 mm  subacute infarct right periatrial white matter. 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 Awaiting placement to SNF.  AKI Creatinine is a little worse today.  Restart IV fluids and monitor BMP.  Type II DM with hyperglycemia Metformin is on hold.  Use NovoLog as needed for hyperglycemia.  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 Patient scored 2 out of 10 on the [facility] which correlates to cognitive limitations that are required for safe completion of medication management and cooking. Patient seen by psychiatry in 04/25/2020 and was deemed to have capacity for medical decision-making.   Constipation Continue laxatives           Diet Order            DIET DYS 3 Room service appropriate? Yes; Fluid consistency: Thin  Diet effective now           Diet - low sodium heart healthy                    Consultants:  Neurologist  Physiatrist  Procedures:  None    Medications:   . amLODipine  10 mg Oral Daily  . aspirin EC  81 mg Oral Daily  . atorvastatin  40 mg Oral Daily  . carvedilol  25 mg Oral BID WC  . clopidogrel  75 mg  Oral Daily  . enoxaparin (LOVENOX) injection  40 mg Subcutaneous Q24H  . insulin aspart  0-15 Units Subcutaneous TID WC  . nicotine  21 mg Transdermal Daily  . polyethylene glycol  17 g Oral Daily  . senna-docusate  2 tablet Oral QHS  . thiamine  100 mg Oral Daily   Or  . thiamine  100 mg Intravenous Daily   Continuous Infusions: . lactated ringers 75 mL/hr at 05/06/20 1010     Anti-infectives (From admission, onward)   None             Family Communication/Anticipated D/C date and plan/Code Status    DVT prophylaxis: enoxaparin (LOVENOX) injection 40 mg Start: 04/24/20 2000     Code Status: Full Code  Family Communication: None Disposition Plan:    Status is: Inpatient  Remains inpatient appropriate because:Unsafe d/c plan   Dispo:  Patient From: Home  Planned Disposition: [place]  Expected discharge date: 05/07/2020  Medically stable for discharge: Yes            Subjective:   Interval events noted.  He has no complaints.  No weakness in the extremities.  Speech is still slurred.  Objective:    Vitals:   05/06/20 0045 05/06/20 0400 05/06/20 0800 05/06/20 1209  BP: 123/63 (!) 124/59 123/63 111/68  Pulse: 84 77 80 74  Resp:      Temp: 98.5 F (36.9 C) 98 F (36.7 C) 98.1 F (36.7 C) 98.4 F (36.9 C)  TempSrc: Oral Oral Oral Oral  SpO2: 97% 99% 99% 100%  Weight:      Height:       No data found.  No intake or output data in the 24 hours ending 05/06/20 1604 Filed Weights   04/23/20 1743  Weight: 84.4 kg    Exam:  GEN: NAD SKIN: No rash EYES: EOMI ENT: MMM CV: RRR PULM: CTA B ABD: soft, ND, NT, +BS CNS: AAO x 3, right facial droop, slurred speech.  No deficits noted in the upper and lower extremities. EXT: No edema or tenderness       Data Reviewed:   I have personally reviewed following labs and imaging studies:  Labs: Labs show the following:   Basic Metabolic Panel: Recent Labs  Lab 05/02/20 0437 05/06/20 0504  NA 138 140  K 3.5 4.2  CL 107 107  CO2 23 24  GLUCOSE 146* 154*  BUN 35* 42*  CREATININE 1.35* 1.59*  CALCIUM 8.9 9.5   GFR Estimated Creatinine Clearance: 44.7 mL/min (A) (by C-G formula based on SCr of 1.59 mg/dL (H)). Liver Function Tests: No results for input(s): AST, ALT, ALKPHOS, BILITOT, PROT, ALBUMIN in the last 168 hours. No results for input(s): LIPASE, AMYLASE in the last 168 hours. No results for input(s): AMMONIA in the last 168 hours. Coagulation profile No results for  input(s): INR, PROTIME in the last 168 hours.  CBC: No results for input(s): WBC, NEUTROABS, HGB, HCT, MCV, PLT in the last 168 hours. Cardiac Enzymes: No results for input(s): CKTOTAL, CKMB, CKMBINDEX, TROPONINI in the last 168 hours. BNP (last 3 results) No results for input(s): PROBNP in the last 8760 hours. CBG: Recent Labs  Lab 05/05/20 1159 05/05/20 1709 05/06/20 0915 05/06/20 1218 05/06/20 1537  GLUCAP 247* 184* 183* 179* 200*   D-Dimer: No results for input(s): DDIMER in the last 72 hours. Hgb A1c: No results for input(s): HGBA1C in the last 72 hours. Lipid Profile: No results for input(s): CHOL,  HDL, LDLCALC, TRIG, CHOLHDL, LDLDIRECT in the last 72 hours. Thyroid function studies: No results for input(s): TSH, T4TOTAL, T3FREE, THYROIDAB in the last 72 hours.  Invalid input(s): FREET3 Anemia work up: No results for input(s): VITAMINB12, FOLATE, FERRITIN, TIBC, IRON, RETICCTPCT in the last 72 hours. Sepsis Labs: No results for input(s): PROCALCITON, WBC, LATICACIDVEN in the last 168 hours.  Microbiology Recent Results (from the past 240 hour(s))  SARS CORONAVIRUS 2 (TAT 6-24 HRS) Nasopharyngeal Nasopharyngeal Swab     Status: None   Collection Time: 04/27/20 11:58 AM   Specimen: Nasopharyngeal Swab  Result Value Ref Range Status   SARS Coronavirus 2 NEGATIVE NEGATIVE Final    Comment: (NOTE) SARS-CoV-2 target nucleic acids are NOT DETECTED.  The SARS-CoV-2 RNA is generally detectable in upper and lower respiratory specimens during the acute phase of infection. Negative results do not preclude SARS-CoV-2 infection, do not rule out co-infections with other pathogens, and should not be used as the sole basis for treatment or other patient management decisions. Negative results must be combined with clinical observations, patient history, and epidemiological information. The expected result is Negative.  Fact Sheet for  Patients: SugarRoll.be  Fact Sheet for Healthcare Providers: https://www.woods-mathews.com/  This test is not yet approved or cleared by the [place] FDA and  has been authorized for detection and/or diagnosis of SARS-CoV-2 by FDA under an Emergency Use Authorization (EUA). This EUA will remain  in effect (meaning this test can be used) for the duration of the COVID-19 declaration under Se ction 564(b)(1) of the Act, 21 U.S.C. section 360bbb-3(b)(1), unless the authorization is terminated or revoked sooner.  Performed at [facility], [place] [address] [place], [place] [address]     Procedures and diagnostic studies:  No results found.             LOS: 12 days   [place] Copywriter, advertising on [email or web address]. If 7PM-7AM, please contact night-coverage at [email or web address]     05/06/2020, 4:04 PM

## 2020-05-07 LAB — GLUCOSE, CAPILLARY
Glucose-Capillary: 148 mg/dL — ABNORMAL HIGH (ref 70–99)
Glucose-Capillary: 233 mg/dL — ABNORMAL HIGH (ref 70–99)
Glucose-Capillary: 191 mg/dL — ABNORMAL HIGH (ref 70–99)
Glucose-Capillary: 226 mg/dL — ABNORMAL HIGH (ref 70–99)
Glucose-Capillary: 155 mg/dL — ABNORMAL HIGH (ref 70–99)

## 2020-05-07 LAB — BASIC METABOLIC PANEL
Anion gap: 8 (ref 5–15)
BUN: 33 mg/dL — ABNORMAL HIGH (ref 8–23)
CO2: 24 mmol/L (ref 22–32)
Calcium: 8.8 mg/dL — ABNORMAL LOW (ref 8.9–10.3)
Chloride: 105 mmol/L (ref 98–111)
Creatinine, Ser: 1.42 mg/dL — ABNORMAL HIGH (ref 0.61–1.24)
GFR, Estimated: 53 mL/min — ABNORMAL LOW (ref >60)
Glucose, Bld: 154 mg/dL — ABNORMAL HIGH (ref 70–99)
Potassium: 3.6 mmol/L (ref 3.5–5.1)
Sodium: 137 mmol/L (ref 135–145)

## 2020-05-07 LAB — MAGNESIUM: Magnesium: 1.6 mg/dL — ABNORMAL LOW (ref 1.7–2.4)

## 2020-05-07 LAB — SARS CORONAVIRUS 2 (TAT 6-24 HRS): SARS Coronavirus 2: NEGATIVE

## 2020-05-07 LAB — PHOSPHORUS: Phosphorus: 3.7 mg/dL (ref 2.5–4.6)

## 2020-05-07 MED ORDER — MAGNESIUM SULFATE 2 GM/50ML IV SOLN
2.0000 g | Freq: Once | INTRAVENOUS | Status: AC
  Administered 2020-05-07: 2 g via INTRAVENOUS
  Filled 2020-05-07: qty 50

## 2020-05-07 NOTE — TOC Progression Note (Signed)
Transition of Care [facility]) - Progression Note    Patient Details  Name: Gregory Nunez. MRN: [id number] Date of Birth: [birth year]  Transition of Care [facility]) CM/SW [place],  Phone Number: 05/07/2020, 4:09 PM  Clinical Narrative:   CSW received call from [facility] that they had male beds open up. CSW asked them to review patient's referral again (they had previously denied), and they are agreeable to accept patient for SNF. CSW left nephew Legrand Como a voicemail with bed offer, and ordered COVID test for plan to admit to SNF tomorrow. CSW faxed insurance authorization request through [facility].    Expected Discharge Plan: Skilled Nursing Facility Barriers to Discharge: SNF Pending bed offer,Insurance Authorization  Expected Discharge Plan and Services Expected Discharge Plan: [place] In-house Referral: Clinical Social Work Discharge Planning Services: CM Consult Post Acute Care Choice: [place] Living arrangements for the past 2 months: Apartment Expected Discharge Date: 04/25/20                                     Social Determinants of Health (SDOH) Interventions    Readmission Risk Interventions No flowsheet data found.

## 2020-05-07 NOTE — Plan of Care (Signed)
  Problem: Education: Goal: Knowledge of disease or condition will improve Outcome: Progressing Goal: Knowledge of secondary prevention will improve Outcome: Progressing Goal: Knowledge of patient specific risk factors addressed and post discharge goals established will improve Outcome: Progressing   Problem: Education: Goal: Knowledge of General Education information will improve Description: Including pain rating scale, medication(s)/side effects and non-pharmacologic comfort measures Outcome: Progressing   Problem: Clinical Measurements: Goal: Ability to maintain clinical measurements within normal limits will improve Outcome: Progressing Goal: Will remain free from infection Outcome: Progressing Goal: Diagnostic test results will improve Outcome: Progressing Goal: Respiratory complications will improve Outcome: Progressing Goal: Cardiovascular complication will be avoided Outcome: Progressing   Problem: Activity: Goal: Risk for activity intolerance will decrease Outcome: Progressing   Problem: Nutrition: Goal: Adequate nutrition will be maintained Outcome: Progressing

## 2020-05-07 NOTE — Progress Notes (Signed)
Physical Therapy Treatment Patient Details Name: Gregory Nunez. MRN: [id number] DOB: 1945-12-05 Today's Date: 05/07/2020    History of Present Illness Pt is a 75 year old male with a medical hx significant for dyspnea, alcohol use, prostate cancer, pancreatitis, HTN, HAV, DM2, and depression who presented with R-sided weakness, R-sided facial droop, and AMS when found wandering around a local CVS. 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 They also showed superimposed remote lacunar infarcts involving the L paramedian pons and posterior R frontoparietal corona radiata. NIHSS of 3 and tPA not administered    PT Comments    Patient with brighter affect and more alert this morning. Ox4 (knew hospital, but thought [facility]--min cues and named [facility]). Patient able to progress to ambulation with second person to follow with chair. During second walk, at ~40 ft pt began with more heavy lean to his rt and fortunate to have chair follow as pt with decr awareness and felt he was safe to keep walking.     Follow Up Recommendations  Supervision/Assistance - 24 hour;SNF (no CIR due to lack of 24/7 support)     Equipment Recommendations  Rolling walker with 5" wheels;3in1 (PT)    Recommendations for Other Services       Precautions / Restrictions Precautions Precautions: Fall Precaution Comments: monitor BP (HTN and orthostatic hypotension)    Mobility  Bed Mobility                  Transfers Overall transfer level: Needs assistance Equipment used: Rolling walker (2 wheeled);None Transfers: Sit to/from Stand Sit to Stand: Min assist         General transfer comment: cues for hand placement and weight shifting for sit<>stand transfers.  Ambulation/Gait Ambulation/Gait assistance: Mod assist;Min assist;+2 safety/equipment Gait Distance  (Feet): 20 Feet (seated rest; 40 with chair follow) Assistive device: Rolling walker (2 wheeled) Gait Pattern/deviations: Shuffle;Trunk flexed;Decreased step length - right;Decreased weight shift to left;Step-through pattern Gait velocity: reduced   General Gait Details: pt requires constant cues for increasing rt step length; requires assist to wtxhift to left as he fatigues with incr rt lean   Stairs             Wheelchair Mobility    Modified Rankin (Stroke Patients Only) Modified Rankin (Stroke Patients Only) Pre-Morbid Rankin Score: No symptoms Modified Rankin: Moderately severe disability     Balance Overall balance assessment: Needs assistance Sitting-balance support: Feet supported;No upper extremity supported Sitting balance-Leahy Scale: Poor Sitting balance - Comments: mild rt lean Postural control: Right lateral lean Standing balance support: Bilateral upper extremity supported;During functional activity Standing balance-Leahy Scale: Poor Standing balance comment: pt tended to lean right, especially with fatigue, needed cues for posture.                            Cognition Arousal/Alertness: Awake/alert Behavior During Therapy: WFL for tasks assessed/performed Overall Cognitive Status: Impaired/Different from baseline Area of Impairment: Problem solving;Awareness;Following commands                 Orientation Level:  (a&ox 4) Current Attention Level: Selective   Following Commands: Follows one step commands consistently;Follows one step commands with increased time;Follows multi-step commands inconsistently Safety/Judgement: Decreased awareness of deficits Awareness: Intellectual Problem Solving: Slow processing;Difficulty sequencing;Requires verbal cues;Decreased initiation;Requires tactile cues General Comments: verbal and  tactile cues needed for sequencing with mobility with increased time for processing.      Exercises General  Exercises - Lower Extremity Long Arc Quad: Both;Strengthening;Seated;5 reps (RLE improved; warm-up prior to walk) Heel Raises: AROM;Both;10 reps;Seated    General Comments        Pertinent Vitals/Pain Pain Assessment: No/denies pain Faces Pain Scale: No hurt    Home Living                      Prior Function            PT Goals (current goals can now be found in the care plan section) Acute Rehab PT Goals Patient Stated Goal: to improve Time For Goal Achievement: 05/08/20 Potential to Achieve Goals: Good Progress towards PT goals: Progressing toward goals    Frequency    Min 3X/week      PT Plan Current plan remains appropriate    Co-evaluation              AM-PAC PT "6 Clicks" Mobility   Outcome Measure  Help needed turning from your back to your side while in a flat bed without using bedrails?: A Little Help needed moving from lying on your back to sitting on the side of a flat bed without using bedrails?: A Little Help needed moving to and from a bed to a chair (including a wheelchair)?: A Little Help needed standing up from a chair using your arms (e.g., wheelchair or bedside chair)?: A Little Help needed to walk in hospital room?: A Lot Help needed climbing 3-5 steps with a railing? : Total 6 Click Score: 15    End of Session Equipment Utilized During Treatment: Gait belt Activity Tolerance: Patient tolerated treatment well Patient left: in chair;with call bell/phone within reach;with chair alarm set   PT Visit Diagnosis: Unsteadiness on feet (R26.81);Other abnormalities of gait and mobility (R26.89);Muscle weakness (generalized) (M62.81);Difficulty in walking, not elsewhere classified (R26.2);Other symptoms and signs involving the nervous system (I71.245)     Time: 8099-8338 PT Time Calculation (min) (ACUTE ONLY): 17 min  Charges:  $Gait Training: 8-22 mins                      Arby Barrette, PT Pager [phone]    [name] 05/07/2020, 10:00 AM

## 2020-05-07 NOTE — Progress Notes (Signed)
  Speech Language Pathology Treatment: Dysphagia;Cognitive-Linquistic  Patient Details Name: Gregory Nunez. MRN: [id number] DOB: [birth year] Today's Date: 05/07/2020 Time: 7408-1448 SLP Time Calculation (min) (ACUTE ONLY): 24 min  Assessment / Plan / Recommendation Clinical Impression  Pt awoke from sleep easily and receptive to lunch meal. Set up assist provided due to decreased right hand dexterity. SLP initiated donning dentures which did not appear to have a significant effect on efficiency. Took additional bites without effectively clearing but able to refrain when cued. He used finger to clear right buccal cavity when tongue not successful. Recommend continue Dys 3 and carry over of strategies.  Dysfluency noted (part word) today and educated to stop and restart utterance. Dysarthria did in fact improve slightly when dentures were donned. Mild problem solving situations during meal/utensil manipulation required minimal assist.  He needed assist for simple hypothetical situations involving re: basic needs. He asked this therapist how to turn on tv with return demonstration effectively but could not manipulate channel or volume with suspected visual disturbance and decreased fine motor ability.     HPI HPI: 75 y.o. male with medical history significant for type 2 diabetes with diabetic retinopathy, hypertension, hyperlipidemia, prostate cancer, and alcohol use who presents to the ED for evaluation of right-sided weakness.  04/23/20 MRI head indicated 3 cm acute ischemic nonhemorrhagic infarct involving the left  basal ganglia, corresponding with abnormality on prior CT.2. Additional 5 mm subacute ischemic nonhemorrhagic right periatrial  white matter infarct,      SLP Plan  Continue with current plan of care       Recommendations  Diet recommendations: Dysphagia 3 (mechanical soft);Thin liquid Liquids provided via: Cup;Straw Medication Administration: Whole meds with  puree Supervision: Patient able to self feed;Intermittent supervision to cue for compensatory strategies Compensations: Slow rate;Small sips/bites;Minimize environmental distractions;Lingual sweep for clearance of pocketing Postural Changes and/or Swallow Maneuvers: Seated upright 90 degrees                Oral Care Recommendations: Oral care BID Follow up Recommendations: Skilled Nursing facility SLP Visit Diagnosis: Dysphagia, oropharyngeal phase (R13.12);Cognitive communication deficit (J85.631) Plan: Continue with current plan of care                       Houston Siren 05/07/2020, 1:55 PM  Orbie Pyo Colvin Caroli.Ed Risk analyst [phone] Office [phone]

## 2020-05-07 NOTE — Progress Notes (Signed)
Occupational Therapy Treatment Patient Details Name: Gregory Nunez. MRN: [id number] DOB: [birth year] Today's Date: 05/07/2020    History of present illness Pt is a 75 year old male with a medical hx significant for dyspnea, alcohol use, prostate cancer, pancreatitis, HTN, HAV, DM2, and depression who presented with R-sided weakness, R-sided facial droop, and AMS when found wandering around a local [facility]. 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 They also showed superimposed remote lacunar infarcts involving the L paramedian pons and posterior R frontoparietal corona radiata. NIHSS of 3 and tPA not administered   OT comments  Patient met lying supine in bed attempting to eat breakfast. Education on proper positioning to prevent choking. Patient in agreement with getting to recliner for breakfast meal. During short-distance functional mobility in room, patient with change in status prompting seated rest break. BP 100/50. RN aware. Further mobility deferred. Patient continues to be limited by decreased cognition, R hemiparesis, decreased orientation to midline with mobility especially with fatigue, and generalized weakness indicating need for continued acute OT services. Recommendation for SNF rehab given patient CLOF and decreased caregiver support.    Follow Up Recommendations  CIR;SNF;Supervision/Assistance - 24 hour (Patient denied CIR placement 2/2 decreased caregiver support.)    Equipment Recommendations  3 in 1 bedside commode    Recommendations for Other Services      Precautions / Restrictions Precautions Precautions: Fall Precaution Comments: monitor BP (HTN and orthostatic hypotension) Restrictions Weight Bearing Restrictions: No       Mobility Bed Mobility Overal bed mobility: Needs Assistance Bed Mobility: Supine to Sit     Supine to  sit: Min guard;HOB elevated     General bed mobility comments: Min guard with min cues for hand placement and sequencing.  Transfers Overall transfer level: Needs assistance Equipment used: Rolling walker (2 wheeled);None Transfers: Sit to/from Stand Sit to Stand: Min assist Stand pivot transfers: Mod assist       General transfer comment: Cues for hand placement and orientation to midline in standing.    Balance Overall balance assessment: Needs assistance Sitting-balance support: Feet supported;No upper extremity supported Sitting balance-Leahy Scale: Poor Sitting balance - Comments: Able to maintain static sitting at EOB without external assist. Postural control: Right lateral lean Standing balance support: Bilateral upper extremity supported;During functional activity Standing balance-Leahy Scale: Poor Standing balance comment: Patient with heavy R lateral lean with increased functional mobility in room with inability to correct despite cueing requiring seated rest break.                           ADL either performed or assessed with clinical judgement   ADL                           Toilet Transfer: Moderate assistance;RW Toilet Transfer Details (indicate cue type and reason): Simulated stand-pivot to recliner with RW and heavy cues for upright posture 2/2 R lateral lean, hand placement and sequencing. Patient unaware of L lateral lean.         Functional mobility during ADLs: Minimal assistance;Moderate assistance General ADL Comments: Min A short distances but requires increased assist with fatigue. Please refer below for additional details.     Vision       Quarry manager  Arousal/Alertness: Awake/alert Behavior During Therapy: WFL for tasks assessed/performed Overall Cognitive Status: Impaired/Different from baseline Area of Impairment: Problem solving;Awareness;Following commands                  Orientation Level: Disoriented to;Time Current Attention Level: Selective   Following Commands: Follows one step commands consistently;Follows one step commands with increased time;Follows multi-step commands inconsistently Safety/Judgement: Decreased awareness of deficits Awareness: Intellectual Problem Solving: Slow processing;Difficulty sequencing;Requires verbal cues;Decreased initiation;Requires tactile cues General Comments: verbal and tactile cues needed for sequencing with mobility with increased time for processing.        Exercises Exercises: General Lower Extremity General Exercises - Lower Extremity Long Arc Quad: Both;Strengthening;Seated;5 reps (RLE improved; warm-up prior to walk) Heel Raises: AROM;Both;10 reps;Seated   Shoulder Instructions       General Comments Initially alert and oriented to name but difficulty recalling D.O.B without multi choice cues. Patient initially stating place as [place] but states "Gregory Nunez" when asked the name of the hospital. Patient able to recall Month with multichoice cues but states correct year. During short-distance mobiltiy in room, patient with change in status prompting seated rest break. BP 100/50. RN notified.    Pertinent Vitals/ Pain       Pain Assessment: No/denies pain Faces Pain Scale: No hurt  Home Living                                          Prior Functioning/Environment              Frequency  Min 2X/week        Progress Toward Goals  OT Goals(current goals can now be found in the care plan section)  Progress towards OT goals: Progressing toward goals  Acute Rehab OT Goals Patient Stated Goal: to improve OT Goal Formulation: With patient Time For Goal Achievement: 05/21/20 Potential to Achieve Goals: Good ADL Goals Pt Will Perform Grooming: with modified independence;standing Pt Will Perform Upper Body Dressing: with modified independence;sitting Pt Will Perform Lower Body  Dressing: with modified independence;sit to/from stand Pt Will Transfer to Toilet: with supervision;bedside commode;ambulating Pt Will Perform Toileting - Clothing Manipulation and hygiene: with supervision;sit to/from stand Pt Will Perform Tub/Shower Transfer: Shower transfer;3 in 1;grab bars;rolling walker Additional ADL Goal #1: Patient will complete RUE NMR HEP with use of written handout in prep for ADLs.  Plan Discharge plan remains appropriate;Frequency remains appropriate    Co-evaluation                 AM-PAC OT "6 Clicks" Daily Activity     Outcome Measure   Help from another person eating meals?: A Little Help from another person taking care of personal grooming?: A Little Help from another person toileting, which includes using toliet, bedpan, or urinal?: A Little Help from another person bathing (including washing, rinsing, drying)?: A Lot Help from another person to put on and taking off regular upper body clothing?: A Little Help from another person to put on and taking off regular lower body clothing?: A Lot 6 Click Score: 16    End of Session Equipment Utilized During Treatment: Gait belt;Rolling walker  OT Visit Diagnosis: Muscle weakness (generalized) (M62.81);Ataxia, unspecified (R27.0);Other symptoms and signs involving the nervous system (R29.898);Hemiplegia and hemiparesis;Unsteadiness on feet (R26.81) Hemiplegia - Right/Left: Right Hemiplegia - dominant/non-dominant: Dominant Hemiplegia - caused by: Cerebral infarction   Activity Tolerance Patient  tolerated treatment well   Patient Left in chair;with call bell/phone within reach;with chair alarm set   Nurse Communication Mobility status;Other (comment) (Changes in vitals with ambulation)        Time: 3212-2482 OT Time Calculation (min): 23 min  Charges: OT General Charges $OT Visit: 1 Visit OT Treatments $Self Care/Home Management : 8-22 mins $Therapeutic Activity: 8-22 mins  Janessa Mickle H.  OTR/L Supplemental OT, Department of rehab services [phone]   Sonda Coppens R H. 05/07/2020, 10:23 AM

## 2020-05-07 NOTE — Progress Notes (Signed)
PROGRESS NOTE    [name]  MWU:[id number] DOB: August 13, 1945 DOA: 04/23/2020 PCP: [name], MD   Brief Narrative:   Gregory Nunez. is a 75 y.o. male with past medical history significant for type 2 diabetes mellitus with diabetic retinopathy, essential hypertension, hyperlipidemia, prostate cancer and alcohol use disorder who presented to the ED with right-sided weakness. Patient was found wandering around a local [facility] and EMS was activated with notable right-sided facial droop and altered mental status.  Upon chart review, concern for excessive alcohol abuse from prior presentations. CT head without contrast with new hypodensity in left paramedian pons consistent with acute infarction and ill-defined hypodensity left lenticular nuclear suspicious for subacute infarct. Neurology was consulted. Patient received 325 mg aspirin and 1 L NS bolus in the ED. Also received Librium and Ativan as needed per CIWA protocol for concern of EtOH withdrawal. Patient was admitted for treatment of acute CVA and possible alcohol withdrawal.   Assessment & Plan:  Acute left basal ganglia infarct: -Suspect embolic source -Continue aspirin, Plavix for 3 weeks followed by aspirin alone -DAPT was started on 04/24/2020 and will be completed on 05/15/2020 -Reviewed MRI brain, MRA head and neck. -Echo showed preserved ejection fraction with normal RV function with no LVH or motion wall abnormalities. -Continue atorvastatin -Continue dysphagia 3 diet as per SLP evaluation -Awaiting SNF placement  AKI: -Kidney function improved this morning with IV hydration.  Continue to monitor  Hypomagnesemia: Replenished.  Repeat magnesium level tomorrow AM  Type 2 diabetes mellitus: Well-controlled well-controlled.  A1c 6.5% -Continue sliding scale insulin.  Metformin is on hold.  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 -Continue supportive care  Chronic constipation: Continue as needed laxatives  Tobacco abuse: Continue nicotine patch  DVT prophylaxis: Lovenox Code Status: Full code Family Communication:  None present at bedside.  Plan of care discussed with patient in length and he verbalized understanding and agreed with it. Disposition Plan: SNF-pending bed offer/insurance authorization  Consultants:   Neurology  Procedures:   MRI head and  MRA head and neck  Echo  Antimicrobials:  None Status is: Inpatient   Dispo:  Patient From: Home  Planned Disposition: [place]  Expected discharge date: 2 to 3 days  medically stable for discharge: Yes   Subjective: Patient seen and examined.  Sitting comfortably on the recliner.  Tells me that this morning he was not doing well however after physical therapy he feels much better.  No acute events overnight.  Tells me that his his speech has improved, denies any weakness.  Objective: Vitals:   05/06/20 2346 05/07/20 0339 05/07/20 0752 05/07/20 1108  BP: 133/74 139/71 125/73 (!) 111/58  Pulse: 86 87 85 70  Resp: 18 19 14 18   Temp: 98.2 F (36.8 C) 98 F (36.7 C) 98.4 F (36.9 C) 98 F (36.7 C)  TempSrc: Oral Oral Oral Oral  SpO2: 100% 96% 100% 100%  Weight:      Height:        Intake/Output Summary (Last 24 hours) at 05/07/2020 1431 Last data filed at 05/07/2020 0700 Gross per 24 hour  Intake 1251.68 ml  Output 1750 ml  Net -498.32 ml   Filed Weights   04/23/20 1743  Weight: 84.4 kg    Examination:  General exam: Appears calm and comfortable, on room air, slurred speech noted Respiratory system: Clear to auscultation. Respiratory effort normal. Cardiovascular system: S1 & S2 heard, RRR. No JVD, murmurs, rubs, gallops or clicks. No pedal  edema. Gastrointestinal system: Abdomen is nondistended, soft and nontender. No organomegaly or masses felt. Normal bowel sounds heard. Central nervous system: Alert and oriented.  Slurred speech, right facial droop, following commands. Extremities: Symmetric 5 x 5 power. Skin: No rashes, lesions or ulcers Psychiatry: Judgement and insight appear normal. Mood & affect appropriate.    Data Reviewed: I have personally reviewed following labs and imaging studies  CBC: No results for input(s): WBC, NEUTROABS, HGB, HCT, MCV, PLT in the last 168 hours. Basic Metabolic Panel: Recent Labs  Lab 05/02/20 0437 05/06/20 0504 05/07/20 0631  NA 138 140 137  K 3.5 4.2 3.6  CL 107 107 105  CO2 23 24 24   GLUCOSE 146* 154* 154*  BUN 35* 42* 33*  CREATININE 1.35* 1.59* 1.42*  CALCIUM 8.9 9.5 8.8*  MG  --   --  1.6*  PHOS  --   --  3.7   GFR: Estimated Creatinine Clearance: 50.1 mL/min (A) (by C-G formula based on SCr of 1.42 mg/dL (H)). Liver Function Tests: No results for input(s): AST, ALT, ALKPHOS, BILITOT, PROT, ALBUMIN in the last 168 hours. No results for input(s): LIPASE, AMYLASE in the last 168 hours. No results for input(s): AMMONIA in the last 168 hours. Coagulation Profile: No results for input(s): INR, PROTIME in the last 168 hours. Cardiac Enzymes: No results for input(s): CKTOTAL, CKMB, CKMBINDEX, TROPONINI in the last 168 hours. BNP (last 3 results) No results for input(s): PROBNP in the last 8760 hours. HbA1C: No results for input(s): HGBA1C in the last 72 hours. CBG: Recent Labs  Lab 05/06/20 1537 05/06/20 2121 05/07/20 0618 05/07/20 0847 05/07/20 1107  GLUCAP 200* 189* 148* 233* 191*   Lipid Profile: No results for input(s): CHOL, HDL, LDLCALC, TRIG, CHOLHDL, LDLDIRECT in the last 72 hours. Thyroid Function Tests: No results for input(s): TSH, T4TOTAL, FREET4, T3FREE, THYROIDAB in the last 72 hours. Anemia Panel: No results for input(s): VITAMINB12, FOLATE,  FERRITIN, TIBC, IRON, RETICCTPCT in the last 72 hours. Sepsis Labs: No results for input(s): PROCALCITON, LATICACIDVEN in the last 168 hours.  No results found for this or any previous visit (from the past 240 hour(s)).    Radiology Studies: No results found.  Scheduled Meds: . amLODipine  10 mg Oral Daily  . aspirin EC  81 mg Oral Daily  . atorvastatin  40 mg Oral Daily  . carvedilol  25 mg Oral BID WC  . clopidogrel  75 mg Oral Daily  . enoxaparin (LOVENOX) injection  40 mg Subcutaneous Q24H  . insulin aspart  0-15 Units Subcutaneous TID WC  . nicotine  21 mg Transdermal Daily  . polyethylene glycol  17 g Oral Daily  . senna-docusate  2 tablet Oral QHS  . thiamine  100 mg Oral Daily   Or  . thiamine  100 mg Intravenous Daily   Continuous Infusions:   LOS: 13 days   Time spent: 35 minutes  Suman Trivedi Loann Quill, MD [facility]  If 7PM-7AM, please contact night-coverage [email or web address] 05/07/2020, 2:31 PM

## 2020-05-08 LAB — BASIC METABOLIC PANEL
Anion gap: 11 (ref 5–15)
BUN: 27 mg/dL — ABNORMAL HIGH (ref 8–23)
CO2: 22 mmol/L (ref 22–32)
Calcium: 8.9 mg/dL (ref 8.9–10.3)
Chloride: 105 mmol/L (ref 98–111)
Creatinine, Ser: 1.4 mg/dL — ABNORMAL HIGH (ref 0.61–1.24)
GFR, Estimated: 53 mL/min — ABNORMAL LOW (ref >60)
Glucose, Bld: 178 mg/dL — ABNORMAL HIGH (ref 70–99)
Potassium: 3.9 mmol/L (ref 3.5–5.1)
Sodium: 138 mmol/L (ref 135–145)

## 2020-05-08 LAB — GLUCOSE, CAPILLARY
Glucose-Capillary: 159 mg/dL — ABNORMAL HIGH (ref 70–99)
Glucose-Capillary: 338 mg/dL — ABNORMAL HIGH (ref 70–99)

## 2020-05-08 LAB — MAGNESIUM: Magnesium: 2 mg/dL (ref 1.7–2.4)

## 2020-05-08 MED ORDER — POLYETHYLENE GLYCOL 3350 17 G PO PACK: 17.0000 g | PACK | Freq: Every day | ORAL | 0 refills | Status: DC

## 2020-05-08 MED ORDER — NICOTINE 21 MG/24HR TD PT24: 21.0000 mg | MEDICATED_PATCH | Freq: Every day | TRANSDERMAL | 0 refills | Status: DC

## 2020-05-08 MED ORDER — CARVEDILOL 25 MG PO TABS: 25.0000 mg | ORAL_TABLET | Freq: Two times a day (BID) | ORAL | 0 refills | Status: DC

## 2020-05-08 MED ORDER — SENNOSIDES-DOCUSATE SODIUM 8.6-50 MG PO TABS: 2.0000 | ORAL_TABLET | Freq: Every evening | ORAL | 0 refills | Status: DC | PRN

## 2020-05-08 NOTE — Discharge Summary (Addendum)
Physician Discharge Summary  [name] VA:[id number] DOB: [birth year] DOA: 04/23/2020  PCP: [name], MD  Admit date: 04/23/2020 Discharge date: 05/08/2020  Admitted From: Home Disposition:  SNF  Recommendations for Outpatient Follow-up:  1. Follow-up with PCP as an outpatient 2. Follow-up with neurology in 6 weeks 3. Repeat BMP in 1 week 4. Continue aspirin and Plavix until 1/28 and then aspirin alone.  Home Health: None Equipment/Devices: None Discharge Condition: Stable CODE STATUS: Full code Diet recommendation: Dysphagia 3 diet  Brief/Interim Summary: Gregory Nunezis a 75 y.o.malewith past medical history significant for type 2 diabetes mellitus with diabetic retinopathy, essential hypertension, hyperlipidemia, prostate cancer and alcohol use disorder who presented to the ED with right-sided weakness. Patient was found wandering around a local [facility] and EMS was activated with notable right-sided facial droop and altered mental status.  Upon chart review, concern for excessive alcohol abuse from prior presentations. CT head without contrast with new hypodensity in left paramedian pons consistent with acute infarction and ill-defined hypodensity left lenticular nuclear suspicious for subacute infarct. Neurology was consulted. Patient received 325 mg aspirin and 1 L NS bolus in the ED. Also received Librium and Ativan as needed per CIWA protocol for concern of EtOH withdrawal. Patient was admitted for treatment of acute CVA and possible alcohol withdrawal.  Acute left basal ganglia infarct: -Suspect embolic source -Patient started on aspirin, Plavix for 3 weeks followed by aspirin alone -DAPT was started on 04/25/2020 and will be completed on 05/16/2020 -Reviewed MRI brain, MRA head and neck. -Echo showed preserved ejection fraction with normal RV function with no LVH or motion wall abnormalities. -Continued atorvastatin -Continue dysphagia 3 diet as per  SLP evaluation -Appreciate neurology's recommendation-signed off and recommended outpatient stroke clinic follow-up in 6 weeks.  AKI: -Kidney function improved.  Repeat BMP in 1 week.  Hypomagnesemia:  Resolved  Type 2 diabetes mellitus: Well-controlled.  A1c 6.5% -Continued sliding scale insulin.    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 -Continued supportive care  Chronic constipation: Continued as needed laxatives  Tobacco abuse: Discharged on nicotine patch.  Patient discharged to a nursing home in a stable condition.  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 Chronic constipation Tobacco abuse  Discharge Instructions  Discharge Instructions    Call MD for:  difficulty breathing, headache or visual disturbances   Complete by: As directed    Call MD for:  extreme fatigue   Complete by: As directed    Call MD for:  persistant dizziness or light-headedness   Complete by: As directed    Call MD for:  persistant nausea and vomiting   Complete by: As directed    Call MD for:  severe uncontrolled pain   Complete by: As directed    Call MD for:  temperature >100.4   Complete by: As directed    Diet - low sodium heart healthy   Complete by: As directed    Discharge instructions   Complete by: As directed    Follow-up with PCP as an outpatient Repeat BMP in 1 week Follow-up with neurology as an outpatient   Increase activity slowly   Complete by: As directed  Increase activity slowly   Complete by: As directed      Allergies as of 05/08/2020      Reactions   Penicillins     Nervous   Penicillins Anxiety, Other (See Comments)   DID THE REACTION INVOLVE: Swelling of the face/tongue/throat, SOB, or low BP? No Sudden or severe rash/hives, skin peeling, or the inside of the mouth or nose? No Did it require medical treatment? No When did it last happen?many years ago If all above answers are "NO", may proceed with cephalosporin use.      Medication List    STOP taking these medications   HYDROcodone-acetaminophen 5-325 MG tablet Commonly known as: NORCO/VICODIN   losartan 100 MG tablet Commonly known as: COZAAR   losartan-hydrochlorothiazide 100-12.5 MG tablet Commonly known as: HYZAAR   methocarbamol 500 MG tablet Commonly known as: ROBAXIN   predniSONE 20 MG tablet Commonly known as: DELTASONE   traMADol 50 MG tablet Commonly known as: ULTRAM     TAKE these medications   albuterol 108 (90 Base) MCG/ACT inhaler Commonly known as: VENTOLIN HFA Inhale 2 puffs into the lungs every 6 (six) hours as needed for wheezing or shortness of breath.   amLODipine 10 MG tablet Commonly known as: NORVASC Take 10 mg by mouth daily.   aspirin 81 MG EC tablet Take 1 tablet (81 mg total) by mouth daily. Swallow whole.   atorvastatin 40 MG tablet Commonly known as: LIPITOR Take 1 tablet (40 mg total) by mouth daily.   carvedilol 25 MG tablet Commonly known as: COREG Take 1 tablet (25 mg total) by mouth 2 (two) times daily with a meal.   clopidogrel 75 MG tablet Commonly known as: PLAVIX Take 1 tablet (75 mg total) by mouth daily for 21 days.   metFORMIN 500 MG tablet Commonly known as: GLUCOPHAGE Take 500 mg by mouth 2 (two) times daily.   nicotine 21 mg/24hr patch Commonly known as: NICODERM CQ - dosed in mg/24 hours Place 1 patch (21 mg total) onto the skin daily. Start taking on: May 09, 2020   pantoprazole 40 MG tablet Commonly known as: Protonix Take 1 tablet (40 mg total) by mouth daily.   polyethylene glycol 17 g  packet Commonly known as: MIRALAX / GLYCOLAX Take 17 g by mouth daily. Start taking on: May 09, 2020   senna-docusate 8.6-50 MG tablet Commonly known as: Senokot-S Take 2 tablets by mouth at bedtime as needed for mild constipation.   traZODone 50 MG tablet Commonly known as: DESYREL Take 50 mg by mouth at bedtime as needed for sleep.            Durable Medical Equipment  (From admission, onward)         Start     Ordered   04/25/20 0810  For home use only DME 3 n 1  Once        04/25/20 0809   04/25/20 0809  For home use only DME Walker rolling  Once       Question Answer Comment  Walker: With 5 Inch Wheels   Patient needs a walker to treat with the following condition Gait abnormality      04/25/20 0809          Contact information for follow-up providers    [name], MD. Schedule an appointment as soon as possible for a visit in 1 week(s).   Specialty: Internal Medicine Contact information: [address] [place] [address] [phone]  [facility]. Schedule an appointment as soon as possible for a visit in 6 week(s).   Specialty: Neurology Contact information: [address] [place], [facility] Follow up.   Specialty: Home Health Services Why: THe home health agency will contact you for the first home visit. Contact information: [place] [address] [phone]            Contact information for after-discharge care    Destination    [facility] Preferred SNF .   Service: Skilled Nursing Contact information: [address] [address] [phone]                 Allergies  Allergen Reactions  . Penicillins     Nervous   . Penicillins Anxiety and Other (See Comments)    DID THE REACTION INVOLVE: Swelling of the face/tongue/throat, SOB, or low BP?  No Sudden or severe rash/hives, skin peeling, or the inside of the mouth or nose? No Did it require medical treatment? No When did it last happen?many years ago If all above answers are "NO", may proceed with cephalosporin use.       Consultations:  Neurology   Procedures/Studies: CT HEAD WO CONTRAST  Result Date: 04/24/2020 CLINICAL DATA:  Follow-up examination for acute stroke. EXAM: CT HEAD WITHOUT CONTRAST TECHNIQUE: Contiguous axial images were obtained from the base of the skull through the vertex without intravenous contrast. COMPARISON:  Prior MRI from 04/23/2020. FINDINGS: Brain: Previously identified approximate 3 cm acute ischemic infarct involving the left basal ganglia again seen, stable in size and distribution. No hemorrhagic transformation or other complication. No significant mass effect. Previously noted additional subcentimeter subacute infarct involving the right periatrial white matter not well visualized by CT. No other new acute intracranial process. No hemorrhage or new large vessel territory infarct. No mass lesion, midline shift or mass effect. No hydrocephalus or extra-axial fluid collection. Atrophy with chronic microvascular ischemic disease again noted. Remote pontine infarct again noted as well. Vascular: No visible hyperdense vessel. Calcified atherosclerosis at the skull base. Skull: Scalp soft tissues and calvarium within normal limits. Sinuses/Orbits: Globes and orbital soft tissues demonstrate no acute finding. Paranasal sinuses remain clear. No significant mastoid effusion. Other: None. IMPRESSION: 1. No significant interval change in size and distribution of ischemic infarct involving the left basal ganglia. No hemorrhagic transformation or other complication. 2. Previously identified subcentimeter subacute right periatrial infarct not well visualized by CT. 3. No other new acute intracranial abnormality. Electronically Signed   By: Jeannine Boga  M.D.   On: 04/24/2020 04:47   MR ANGIO HEAD WO CONTRAST  Result Date: 04/23/2020 CLINICAL DATA:  Initial evaluation for acute stroke. EXAM: MRI HEAD WITHOUT CONTRAST MRA HEAD WITHOUT CONTRAST MRA NECK WITHOUT AND WITH CONTRAST TECHNIQUE: Multiplanar, multiecho pulse sequences of the brain and surrounding structures were obtained without intravenous contrast. Angiographic images of the Circle of Willis were obtained using MRA technique without intravenous contrast. Angiographic images of the neck were obtained using MRA technique without and with intravenous contrast. Carotid stenosis measurements (when applicable) are obtained utilizing NASCET criteria, using the distal internal carotid diameter as the denominator. CONTRAST:  5mL GADAVIST GADOBUTROL 1 MMOL/ML IV SOLN COMPARISON:  Prior head CT from earlier the same day. FINDINGS: MRI HEAD FINDINGS Brain: Examination degraded by motion artifact. Diffuse prominence of the CSF containing spaces compatible generalized age-related cerebral atrophy. Patchy  and confluent T2/FLAIR hyperintensity within the periventricular and deep white matter both cerebral hemispheres most consistent with chronic small vessel ischemic disease. Patchy involvement of the pons noted. Superimposed remote lacunar infarct present at the left paramedian pons. Additional small remote lacunar infarct present at the posterior right frontoparietal corona radiata (series 15, image 17). Approximate 3 cm focus of curvilinear restricted diffusion seen involving the left basal ganglia, extending from the left lentiform nucleus/subinsular white matter to the left caudate, consistent with acute ischemic infarct. Finding corresponds with abnormality on prior CT. Probable associated trace petechial hemorrhage (series 11, image 30). No frank hemorrhagic transformation or significant regional mass effect. An additional 5 mm focus of mild diffusion abnormality noted within the right periatrial white matter,  consistent with a small subacute small vessel type infarct (series 5, image 71). No associated hemorrhage or mass effect. No other diffusion abnormality to suggest acute or subacute ischemia. Gray-white matter differentiation otherwise maintained. No acute intracranial hemorrhage. Subcentimeter chronic microhemorrhage noted at the posterior left temporal occipital region, of doubtful significance isolation (series 11, image 29). No mass lesion, midline shift or mass effect. No hydrocephalus or extra-axial fluid collection. Pituitary gland suprasellar region normal. Midline structures intact. Vascular: Major intracranial vascular flow voids are maintained. Skull and upper cervical spine: Craniocervical junction normal. Bone marrow signal intensity within normal limits. No scalp soft tissue abnormality. Sinuses/Orbits: Patient status post bilateral ocular lens replacement. Globes and orbital soft tissues demonstrate no acute finding. Paranasal sinuses are largely clear. No significant mastoid effusion. Inner ear structures grossly normal. Other: None. MRA HEAD FINDINGS ANTERIOR CIRCULATION: Emanation examination degraded by motion artifact. Visualized distal cervical segments of the internal carotid arteries are somewhat tortuous but patent with antegrade flow. Petrous, cavernous, and supraclinoid segments widely patent without appreciable stenosis. A1 segments patent bilaterally. Normal anterior communicating artery complex. Anterior cerebral arteries patent to their distal aspects without appreciable stenosis. No M1 stenosis or occlusion. Normal left MCA bifurcation. Right MCA trifurcations. Distal MCA branches well perfused and grossly symmetric. POSTERIOR CIRCULATION: Left V4 segment dominant. Mild atheromatous narrowing of the distal left V4 segment just prior to the vertebrobasilar junction. Left PICA origin patent and grossly normal. Right V4 segment patent without stenosis. Right PICA origin not seen.  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 Basilar otherwise widely patent. Superior cerebellar arteries grossly patent bilaterally. Both PCAs primarily supplied via the basilar, although a small right posterior communicating artery noted. PCAs well perfused to their distal aspects without appreciable stenosis. No aneurysm. MRA NECK FINDINGS AORTIC ARCH: Visualized aortic arch of normal caliber with normal 3 vessel morphology. No hemodynamically significant stenosis seen about the origin of the great vessels. Visualized subclavian arteries widely patent. RIGHT CAROTID SYSTEM: Right common and internal carotid arteries widely patent without stenosis, evidence for dissection, or occlusion. No significant atheromatous irregularity or narrowing about the right bifurcation. LEFT CAROTID SYSTEM: Left common and internal carotid arteries widely patent without stenosis, evidence for dissection or occlusion. No significant atheromatous narrowing or irregularity about the left carotid bifurcation. VERTEBRAL ARTERIES: Both vertebral arteries arise from the subclavian arteries. No proximal subclavian artery stenosis. Left vertebral artery dominant. Single short-segment moderate stenosis at the pre foraminal left V1 segment (series 1021, image 13). Vertebral arteries otherwise widely patent within the neck without stenosis, evidence for dissection or occlusion. IMPRESSION: MRI HEAD IMPRESSION: 1. 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 2. Additional 5  mm subacute ischemic nonhemorrhagic right periatrial white matter infarct. 3. 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 MRA HEAD IMPRESSION: 1. Motion degraded exam. 2.  Negative intracranial MRA for large vessel occlusion. 3. 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 No other hemodynamically significant or correctable stenosis identified. MRA NECK IMPRESSION: 1. Wide patency of both carotid artery systems within the neck. 2. Single short-segment moderate stenosis involving the pre foraminal left V1 segment. Vertebral arteries otherwise widely patent within the neck. Left vertebral artery dominant. Electronically Signed   By: Jeannine Boga M.D.   On: 04/23/2020 21:43   MR ANGIO NECK W WO CONTRAST  Result Date: 04/23/2020 CLINICAL DATA:  Initial evaluation for acute stroke. EXAM: MRI HEAD WITHOUT CONTRAST MRA HEAD WITHOUT CONTRAST MRA NECK WITHOUT AND WITH CONTRAST TECHNIQUE: Multiplanar, multiecho pulse sequences of the brain and surrounding structures were obtained without intravenous contrast. Angiographic images of the Circle of Willis were obtained using MRA technique without intravenous contrast. Angiographic images of the neck were obtained using MRA technique without and with intravenous contrast. Carotid stenosis measurements (when applicable) are obtained utilizing NASCET criteria, using the distal internal carotid diameter as the denominator. CONTRAST:  42mL GADAVIST GADOBUTROL 1 MMOL/ML IV SOLN COMPARISON:  Prior head CT from earlier the same day. FINDINGS: MRI HEAD FINDINGS Brain: Examination degraded by motion artifact. Diffuse prominence of the CSF containing spaces compatible generalized age-related cerebral atrophy. Patchy and confluent T2/FLAIR hyperintensity within the periventricular and deep white matter both cerebral hemispheres most consistent with chronic small vessel ischemic disease. Patchy involvement of the pons noted. Superimposed remote lacunar infarct present at the left paramedian pons. Additional small remote lacunar infarct present at the posterior right frontoparietal corona radiata (series 15, image 17). Approximate  3 cm focus of curvilinear restricted diffusion seen involving the left basal ganglia, extending from the left lentiform nucleus/subinsular white matter to the left caudate, consistent with acute ischemic infarct. Finding corresponds with abnormality on prior CT. Probable associated trace petechial hemorrhage (series 11, image 30). No frank hemorrhagic transformation or significant regional mass effect. An additional 5 mm focus of mild diffusion abnormality noted within the right periatrial white matter, consistent with a small subacute small vessel type infarct (series 5, image 71). No associated hemorrhage or mass effect. No other diffusion abnormality to suggest acute or subacute ischemia. Gray-white matter differentiation otherwise maintained. No acute intracranial hemorrhage. Subcentimeter chronic microhemorrhage noted at the posterior left temporal occipital region, of doubtful significance isolation (series 11, image 29). No mass lesion, midline shift or mass effect. No hydrocephalus or extra-axial fluid collection. Pituitary gland suprasellar region normal. Midline structures intact. Vascular: Major intracranial vascular flow voids are maintained. Skull and upper cervical spine: Craniocervical junction normal. Bone marrow signal intensity within normal limits. No scalp soft tissue abnormality. Sinuses/Orbits: Patient status post bilateral ocular lens replacement. Globes and orbital soft tissues demonstrate no acute finding. Paranasal sinuses are largely clear. No significant mastoid effusion. Inner ear structures grossly normal. Other: None. MRA HEAD FINDINGS ANTERIOR CIRCULATION: Emanation examination degraded by motion artifact. Visualized distal cervical segments of the internal carotid arteries are somewhat tortuous but patent with antegrade flow. Petrous, cavernous, and supraclinoid segments widely patent without appreciable stenosis. A1 segments patent bilaterally. Normal anterior communicating artery  complex. Anterior cerebral arteries patent to their distal aspects without appreciable stenosis. No M1 stenosis or occlusion. Normal left MCA bifurcation. Right  MCA trifurcations. Distal MCA branches well perfused and grossly symmetric. POSTERIOR CIRCULATION: Left V4 segment dominant. Mild atheromatous narrowing of the distal left V4 segment just prior to the vertebrobasilar junction. Left PICA origin patent and grossly normal. Right V4 segment patent without stenosis. Right PICA origin not seen. 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 Basilar otherwise widely patent. Superior cerebellar arteries grossly patent bilaterally. Both PCAs primarily supplied via the basilar, although a small right posterior communicating artery noted. PCAs well perfused to their distal aspects without appreciable stenosis. No aneurysm. MRA NECK FINDINGS AORTIC ARCH: Visualized aortic arch of normal caliber with normal 3 vessel morphology. No hemodynamically significant stenosis seen about the origin of the great vessels. Visualized subclavian arteries widely patent. RIGHT CAROTID SYSTEM: Right common and internal carotid arteries widely patent without stenosis, evidence for dissection, or occlusion. No significant atheromatous irregularity or narrowing about the right bifurcation. LEFT CAROTID SYSTEM: Left common and internal carotid arteries widely patent without stenosis, evidence for dissection or occlusion. No significant atheromatous narrowing or irregularity about the left carotid bifurcation. VERTEBRAL ARTERIES: Both vertebral arteries arise from the subclavian arteries. No proximal subclavian artery stenosis. Left vertebral artery dominant. Single short-segment moderate stenosis at the pre foraminal left V1 segment (series 1021, image 13). Vertebral arteries otherwise widely patent within the neck without stenosis, evidence for dissection or occlusion. IMPRESSION: MRI HEAD  IMPRESSION: 1. 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 2. Additional 5 mm subacute ischemic nonhemorrhagic right periatrial white matter infarct. 3. 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 MRA HEAD IMPRESSION: 1. Motion degraded exam. 2. Negative intracranial MRA for large vessel occlusion. 3. 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 No other hemodynamically significant or correctable stenosis identified. MRA NECK IMPRESSION: 1. Wide patency of both carotid artery systems within the neck. 2. Single short-segment moderate stenosis involving the pre foraminal left V1 segment. Vertebral arteries otherwise widely patent within the neck. Left vertebral artery dominant. Electronically Signed   By: Jeannine Boga M.D.   On: 04/23/2020 21:43   MR BRAIN WO CONTRAST  Result Date: 04/23/2020 CLINICAL DATA:  Initial evaluation for acute stroke. EXAM: MRI HEAD WITHOUT CONTRAST MRA HEAD WITHOUT CONTRAST MRA NECK WITHOUT AND WITH CONTRAST TECHNIQUE: Multiplanar, multiecho pulse sequences of the brain and surrounding structures were obtained without intravenous contrast. Angiographic images of the Circle of Willis were obtained using MRA technique without intravenous contrast. Angiographic images of the neck were obtained using MRA technique without and with intravenous contrast. Carotid stenosis measurements (when applicable) are obtained utilizing NASCET criteria, using the distal internal carotid diameter as the denominator. CONTRAST:  30mL GADAVIST GADOBUTROL 1 MMOL/ML IV SOLN COMPARISON:  Prior head CT from earlier the same day. FINDINGS: MRI HEAD FINDINGS Brain: Examination degraded by motion artifact. Diffuse prominence of the CSF  containing spaces compatible generalized age-related cerebral atrophy. Patchy and confluent T2/FLAIR hyperintensity within the periventricular and deep white matter both cerebral hemispheres most consistent with chronic small vessel ischemic disease. Patchy involvement of the pons noted. Superimposed remote lacunar infarct present at the left paramedian pons. Additional small remote lacunar infarct present at the posterior right frontoparietal corona radiata (series 15, image 17). Approximate 3 cm focus of curvilinear restricted diffusion seen involving the left basal ganglia, extending from the left lentiform nucleus/subinsular  white matter to the left caudate, consistent with acute ischemic infarct. Finding corresponds with abnormality on prior CT. Probable associated trace petechial hemorrhage (series 11, image 30). No frank hemorrhagic transformation or significant regional mass effect. An additional 5 mm focus of mild diffusion abnormality noted within the right periatrial white matter, consistent with a small subacute small vessel type infarct (series 5, image 71). No associated hemorrhage or mass effect. No other diffusion abnormality to suggest acute or subacute ischemia. Gray-white matter differentiation otherwise maintained. No acute intracranial hemorrhage. Subcentimeter chronic microhemorrhage noted at the posterior left temporal occipital region, of doubtful significance isolation (series 11, image 29). No mass lesion, midline shift or mass effect. No hydrocephalus or extra-axial fluid collection. Pituitary gland suprasellar region normal. Midline structures intact. Vascular: Major intracranial vascular flow voids are maintained. Skull and upper cervical spine: Craniocervical junction normal. Bone marrow signal intensity within normal limits. No scalp soft tissue abnormality. Sinuses/Orbits: Patient status post bilateral ocular lens replacement. Globes and orbital soft tissues demonstrate no acute  finding. Paranasal sinuses are largely clear. No significant mastoid effusion. Inner ear structures grossly normal. Other: None. MRA HEAD FINDINGS ANTERIOR CIRCULATION: Emanation examination degraded by motion artifact. Visualized distal cervical segments of the internal carotid arteries are somewhat tortuous but patent with antegrade flow. Petrous, cavernous, and supraclinoid segments widely patent without appreciable stenosis. A1 segments patent bilaterally. Normal anterior communicating artery complex. Anterior cerebral arteries patent to their distal aspects without appreciable stenosis. No M1 stenosis or occlusion. Normal left MCA bifurcation. Right MCA trifurcations. Distal MCA branches well perfused and grossly symmetric. POSTERIOR CIRCULATION: Left V4 segment dominant. Mild atheromatous narrowing of the distal left V4 segment just prior to the vertebrobasilar junction. Left PICA origin patent and grossly normal. Right V4 segment patent without stenosis. Right PICA origin not seen. 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 Basilar otherwise widely patent. Superior cerebellar arteries grossly patent bilaterally. Both PCAs primarily supplied via the basilar, although a small right posterior communicating artery noted. PCAs well perfused to their distal aspects without appreciable stenosis. No aneurysm. MRA NECK FINDINGS AORTIC ARCH: Visualized aortic arch of normal caliber with normal 3 vessel morphology. No hemodynamically significant stenosis seen about the origin of the great vessels. Visualized subclavian arteries widely patent. RIGHT CAROTID SYSTEM: Right common and internal carotid arteries widely patent without stenosis, evidence for dissection, or occlusion. No significant atheromatous irregularity or narrowing about the right bifurcation. LEFT CAROTID SYSTEM: Left common and internal carotid arteries widely patent without stenosis, evidence for  dissection or occlusion. No significant atheromatous narrowing or irregularity about the left carotid bifurcation. VERTEBRAL ARTERIES: Both vertebral arteries arise from the subclavian arteries. No proximal subclavian artery stenosis. Left vertebral artery dominant. Single short-segment moderate stenosis at the pre foraminal left V1 segment (series 1021, image 13). Vertebral arteries otherwise widely patent within the neck without stenosis, evidence for dissection or occlusion. IMPRESSION: MRI HEAD IMPRESSION: 1. 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 2. Additional 5 mm subacute ischemic nonhemorrhagic right periatrial white matter infarct. 3. 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 MRA HEAD IMPRESSION: 1. Motion degraded exam. 2. Negative intracranial MRA for large vessel occlusion. 3. 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 No other hemodynamically significant or correctable stenosis identified. MRA NECK IMPRESSION: 1. Wide patency of  both carotid artery systems within the neck. 2. Single short-segment moderate stenosis involving the pre foraminal left V1 segment. Vertebral arteries otherwise widely patent within the neck. Left vertebral artery dominant. Electronically Signed   By: Jeannine Boga M.D.   On: 04/23/2020 21:43   ECHOCARDIOGRAM COMPLETE  Result Date: 04/24/2020    ECHOCARDIOGRAM REPORT   Patient Name:   Gregory Nunez. Date of Exam: 04/24/2020 Medical Rec #:  [id number]               Height:       71.0 in Accession #:    [id number]              Weight:       186.1 lb Date of Birth:  16-May-1945               BSA:          2.045 m Patient Age:    75 years                BP:           166/95 mmHg Patient  Gender: M                       HR:           87 bpm. Exam Location:  Inpatient Procedure: 2D Echo, Cardiac Doppler and Color Doppler Indications:    Stroke 434.91 / I163.9  History:        Patient has no prior history of Echocardiogram examinations.                 Signs/Symptoms:Dyspnea; Risk Factors:Hypertension, Diabetes and                 Dyslipidemia. Cancer. GERD.  Sonographer:    Jonelle Sidle Dance Referring Phys: [id number] [place]  1. Left ventricular ejection fraction, by estimation, is 60 to 65%. The left ventricle has normal function. The left ventricle has no regional wall motion abnormalities. There is mild concentric left ventricular hypertrophy. Left ventricular diastolic parameters are consistent with Grade I diastolic dysfunction (impaired relaxation).  2. Right ventricular systolic function is normal. The right ventricular size is normal.  3. The mitral valve is normal in structure. Mild mitral valve regurgitation. No evidence of mitral stenosis.  4. The aortic valve is normal in structure. Aortic valve regurgitation is not visualized. No aortic stenosis is present.  5. The inferior vena cava is normal in size with greater than 50% respiratory variability, suggesting right atrial pressure of 3 mmHg. Conclusion(s)/Recommendation(s): No intracardiac source of embolism detected on this transthoracic study. A transesophageal echocardiogram is recommended to exclude cardiac source of embolism if clinically indicated. FINDINGS  Left Ventricle: Left ventricular ejection fraction, by estimation, is 60 to 65%. The left ventricle has normal function. The left ventricle has no regional wall motion abnormalities. The left ventricular internal cavity size was normal in size. There is  mild concentric left ventricular hypertrophy. Left ventricular diastolic parameters are consistent with Grade I diastolic dysfunction (impaired relaxation). Normal left ventricular filling pressure. Right Ventricle:  The right ventricular size is normal. No increase in right ventricular wall thickness. Right ventricular systolic function is normal. Left Atrium: Left atrial size was normal in size. Right Atrium: Right atrial size was normal in size. Pericardium: There is no evidence of pericardial effusion. Mitral Valve: The mitral valve is normal in structure. Mild mitral valve regurgitation. No evidence of  mitral valve stenosis. Tricuspid Valve: The tricuspid valve is normal in structure. Tricuspid valve regurgitation is mild . No evidence of tricuspid stenosis. Aortic Valve: The aortic valve is normal in structure. Aortic valve regurgitation is not visualized. No aortic stenosis is present. Pulmonic Valve: The pulmonic valve was normal in structure. Pulmonic valve regurgitation is not visualized. No evidence of pulmonic stenosis. Aorta: The aortic root is normal in size and structure. Venous: The inferior vena cava is normal in size with greater than 50% respiratory variability, suggesting right atrial pressure of 3 mmHg. IAS/Shunts: No atrial level shunt detected by color flow Doppler.  LEFT VENTRICLE PLAX 2D LVIDd:         4.20 cm  Diastology LVIDs:         3.30 cm  LV e' medial:    6.64 cm/s LV PW:         1.10 cm  LV E/e' medial:  7.4 LV IVS:        1.10 cm  LV e' lateral:   6.96 cm/s LVOT diam:     2.20 cm  LV E/e' lateral: 7.0 LV SV:         63 LV SV Index:   31 LVOT Area:     3.80 cm  RIGHT VENTRICLE            IVC RV Basal diam:  3.00 cm    IVC diam: 1.90 cm RV S prime:     8.49 cm/s TAPSE (M-mode): 1.7 cm LEFT ATRIUM             Index       RIGHT ATRIUM           Index LA diam:        3.20 cm 1.56 cm/m  RA Area:     12.30 cm LA Vol (A2C):   67.1 ml 32.81 ml/m RA Volume:   28.30 ml  13.84 ml/m LA Vol (A4C):   53.9 ml 26.36 ml/m LA Biplane Vol: 62.0 ml 30.32 ml/m  AORTIC VALVE LVOT Vmax:   86.20 cm/s LVOT Vmean:  55.300 cm/s LVOT VTI:    0.166 m  AORTA Ao Root diam: 3.40 cm Ao Asc diam:  3.30 cm MITRAL VALVE MV  Area (PHT): 2.10 cm    SHUNTS MV Decel Time: 361 msec    Systemic VTI:  0.17 m MV E velocity: 49.05 cm/s  Systemic Diam: 2.20 cm MV A velocity: 63.40 cm/s MV E/A ratio:  0.77 Ena Dawley MD Electronically signed by Ena Dawley MD Signature Date/Time: 04/24/2020/11:59:21 AM    Final    CT HEAD CODE STROKE WO CONTRAST  Result Date: 04/23/2020 CLINICAL DATA:  Code stroke. Acute neuro deficit. Right facial droop and confusion EXAM: CT HEAD WITHOUT CONTRAST TECHNIQUE: Contiguous axial images were obtained from the base of the skull through the vertex without intravenous contrast. COMPARISON:  CT head 11/07/2018 FINDINGS: Brain: New area of ill-defined hypodensity in the left lenticular nucleus likely a subacute infarct. New hypodensity in the left paramedian pons which is relatively well defined and likely is a chronic lacunar infarct but could be subacute. Generalized atrophy. Chronic microvascular ischemic changes in the white matter are relatively mild. Negative for hemorrhage or mass. Ventricle size normal. Vascular: Negative for hyperdense vessel Skull: Negative Sinuses/Orbits: Paranasal sinuses clear. Bilateral cataract extraction. Other: None ASPECTS ([place] Stroke Program Early CT Score) - Ganglionic level infarction (caudate, lentiform nuclei, internal capsule, insula, M1-M3 cortex): 6 - Supraganglionic infarction (M4-M6 cortex):  3 Total score (0-10 with 10 being normal): 9 IMPRESSION: 1. New ill-defined hypodensity left lenticular nucleus suspicious for subacute infarct. 2. New hypodensity in the left paramedian pons consistent with acute infarction which is likely chronic but could be recent. 3. Negative for hemorrhage 4. ASPECTS is 9 5. Code stroke imaging results were communicated on 04/23/2020 at 5:36 pm to provider Lorrin Goodell via text page Electronically Signed   By: Franchot Gallo M.D.   On: 04/23/2020 17:39      Subjective: Patient seen and examined.  Sitting comfortably on recliner.   Tells me that he feels much better this morning.  He slept well last night.  His speech is improving.  Denies any weakness numbness or tingling sensation in hands or feet.  Denies headache, blurry vision, chest pain or shortness of breath.  Comfortable going to nursing home today.  Discharge Exam: Vitals:   05/08/20 0331 05/08/20 0815  BP: 130/74 133/71  Pulse: 78 73  Resp: 18 14  Temp: (!) 97.5 F (36.4 C) 98.1 F (36.7 C)  SpO2: 100% 100%   Vitals:   05/07/20 2019 05/07/20 2314 05/08/20 0331 05/08/20 0815  BP: 129/81 124/63 130/74 133/71  Pulse: 86 73 78 73  Resp: 18 18 18 14   Temp: 98.9 F (37.2 C) 98 F (36.7 C) (!) 97.5 F (36.4 C) 98.1 F (36.7 C)  TempSrc:  Oral Oral Oral  SpO2: 100% 100% 100% 100%  Weight:      Height:        General: Pt is alert, awake, not in acute distress, on room air, communicating well, slurred speech noted.  Mild Right facial droop.  Following commands. Cardiovascular: RRR, S1/S2 +, no rubs, no gallops Respiratory: CTA bilaterally, no wheezing, no rhonchi Abdominal: Soft, NT, ND, bowel sounds + Extremities: no edema, no cyanosis    The results of significant diagnostics from this hospitalization (including imaging, microbiology, ancillary and laboratory) are listed below for reference.     Microbiology: Recent Results (from the past 240 hour(s))  SARS CORONAVIRUS 2 (TAT 6-24 HRS) Nasopharyngeal Nasopharyngeal Swab     Status: None   Collection Time: 05/07/20  3:59 PM   Specimen: Nasopharyngeal Swab  Result Value Ref Range Status   SARS Coronavirus 2 NEGATIVE NEGATIVE Final    Comment: (NOTE) SARS-CoV-2 target nucleic acids are NOT DETECTED.  The SARS-CoV-2 RNA is generally detectable in upper and lower respiratory specimens during the acute phase of infection. Negative results do not preclude SARS-CoV-2 infection, do not rule out co-infections with other pathogens, and should not be used as the sole basis for treatment or other  patient management decisions. Negative results must be combined with clinical observations, patient history, and epidemiological information. The expected result is Negative.  Fact Sheet for Patients: SugarRoll.be  Fact Sheet for Healthcare Providers: https://www.woods-mathews.com/  This test is not yet approved or cleared by the [place] FDA and  has been authorized for detection and/or diagnosis of SARS-CoV-2 by FDA under an Emergency Use Authorization (EUA). This EUA will remain  in effect (meaning this test can be used) for the duration of the COVID-19 declaration under Se ction 564(b)(1) of the Act, 21 U.S.C. section 360bbb-3(b)(1), unless the authorization is terminated or revoked sooner.  Performed at [facility], [place] [address] [place], [place] [address]      Labs: BNP (last 3 results) No results for input(s): BNP in the last 8760 hours. Basic Metabolic Panel: Recent Labs  Lab 05/02/20 [phone]  05/06/20 0504 05/07/20 0631 05/08/20 0806  NA 138 140 137 138  K 3.5 4.2 3.6 3.9  CL 107 107 105 105  CO2 23 24 24 22   GLUCOSE 146* 154* 154* 178*  BUN 35* 42* 33* 27*  CREATININE 1.35* 1.59* 1.42* 1.40*  CALCIUM 8.9 9.5 8.8* 8.9  MG  --   --  1.6* 2.0  PHOS  --   --  3.7  --    Liver Function Tests: No results for input(s): AST, ALT, ALKPHOS, BILITOT, PROT, ALBUMIN in the last 168 hours. No results for input(s): LIPASE, AMYLASE in the last 168 hours. No results for input(s): AMMONIA in the last 168 hours. CBC: No results for input(s): WBC, NEUTROABS, HGB, HCT, MCV, PLT in the last 168 hours. Cardiac Enzymes: No results for input(s): CKTOTAL, CKMB, CKMBINDEX, TROPONINI in the last 168 hours. BNP: Invalid input(s): POCBNP CBG: Recent Labs  Lab 05/07/20 0847 05/07/20 1107 05/07/20 1636 05/07/20 2208 05/08/20 0628  GLUCAP 233* 191* 226* 155* 159*   D-Dimer No results for input(s): DDIMER in the last 72  hours. Hgb A1c No results for input(s): HGBA1C in the last 72 hours. Lipid Profile No results for input(s): CHOL, HDL, LDLCALC, TRIG, CHOLHDL, LDLDIRECT in the last 72 hours. Thyroid function studies No results for input(s): TSH, T4TOTAL, T3FREE, THYROIDAB in the last 72 hours.  Invalid input(s): FREET3 Anemia work up No results for input(s): VITAMINB12, FOLATE, FERRITIN, TIBC, IRON, RETICCTPCT in the last 72 hours. Urinalysis    Component Value Date/Time   COLORURINE YELLOW 04/25/2020 1242   APPEARANCEUR CLEAR 04/25/2020 1242   LABSPEC 1.010 04/25/2020 1242   PHURINE 6.0 04/25/2020 1242   GLUCOSEU 50 (A) 04/25/2020 1242   HGBUR SMALL (A) 04/25/2020 1242   BILIRUBINUR NEGATIVE 04/25/2020 1242   KETONESUR NEGATIVE 04/25/2020 1242   PROTEINUR >=300 (A) 04/25/2020 1242   UROBILINOGEN 1.0 01/10/2019 1132   NITRITE NEGATIVE 04/25/2020 1242   LEUKOCYTESUR NEGATIVE 04/25/2020 1242   Sepsis Labs Invalid input(s): PROCALCITONIN,  WBC,  LACTICIDVEN Microbiology Recent Results (from the past 240 hour(s))  SARS CORONAVIRUS 2 (TAT 6-24 HRS) Nasopharyngeal Nasopharyngeal Swab     Status: None   Collection Time: 05/07/20  3:59 PM   Specimen: Nasopharyngeal Swab  Result Value Ref Range Status   SARS Coronavirus 2 NEGATIVE NEGATIVE Final    Comment: (NOTE) SARS-CoV-2 target nucleic acids are NOT DETECTED.  The SARS-CoV-2 RNA is generally detectable in upper and lower respiratory specimens during the acute phase of infection. Negative results do not preclude SARS-CoV-2 infection, do not rule out co-infections with other pathogens, and should not be used as the sole basis for treatment or other patient management decisions. Negative results must be combined with clinical observations, patient history, and epidemiological information. The expected result is Negative.  Fact Sheet for Patients: SugarRoll.be  Fact Sheet for Healthcare  Providers: https://www.woods-mathews.com/  This test is not yet approved or cleared by the [place] FDA and  has been authorized for detection and/or diagnosis of SARS-CoV-2 by FDA under an Emergency Use Authorization (EUA). This EUA will remain  in effect (meaning this test can be used) for the duration of the COVID-19 declaration under Se ction 564(b)(1) of the Act, 21 U.S.C. section 360bbb-3(b)(1), unless the authorization is terminated or revoked sooner.  Performed at [facility], [place] [address], [place], [place] [address]      Time coordinating discharge: Over 30 minutes  SIGNED:   Mckinley Jewel, MD  [facility] 05/08/2020, 11:58 AM Pager   If 7PM-7AM, please contact night-coverage [email or web address]

## 2020-05-08 NOTE — TOC Transition Note (Signed)
Transition of Care [facility]) - CM/SW Discharge Note   Patient Details  Name: Gregory Nunez. MRN: [id number] Date of Birth: [birth year]  Transition of Care [facility]) CM/SW Contact:  Geralynn Ochs, LCSW Phone Number: 05/08/2020, 12:18 PM   Clinical Narrative:   Nurse to call report to [phone], Room 226.    Final next level of care: Skilled Nursing Facility Barriers to Discharge: Barriers Resolved   Patient Goals and CMS Choice   CMS Medicare.gov Compare Post Acute Care list provided to:: Patient Represenative (must comment) Choice offered to / list presented to :  (nephew)  Discharge Placement              Patient chooses bed at: [facility] Patient to be transferred to facility by: [place] Name of family member notified: Legrand Como Patient and family notified of of transfer: 05/08/20  Discharge Plan and Services In-house Referral: Clinical Social Work Discharge Planning Services: [employer] Consult Post Acute Care Choice: [place]                               Social Determinants of Health (SDOH) Interventions     Readmission Risk Interventions No flowsheet data found.

## 2020-05-08 NOTE — Plan of Care (Signed)
  Problem: Education: Goal: Knowledge of disease or condition will improve Outcome: Adequate for Discharge Goal: Knowledge of secondary prevention will improve Outcome: Adequate for Discharge Goal: Knowledge of patient specific risk factors addressed and post discharge goals established will improve Outcome: Adequate for Discharge   Problem: Education: Goal: Knowledge of General Education information will improve Description: Including pain rating scale, medication(s)/side effects and non-pharmacologic comfort measures Outcome: Adequate for Discharge   Problem: Health Behavior/Discharge Planning: Goal: Ability to manage health-related needs will improve Outcome: Adequate for Discharge   Problem: Clinical Measurements: Goal: Ability to maintain clinical measurements within normal limits will improve Outcome: Adequate for Discharge Goal: Will remain free from infection Outcome: Adequate for Discharge Goal: Diagnostic test results will improve Outcome: Adequate for Discharge Goal: Respiratory complications will improve Outcome: Adequate for Discharge Goal: Cardiovascular complication will be avoided Outcome: Adequate for Discharge   Problem: Activity: Goal: Risk for activity intolerance will decrease Outcome: Adequate for Discharge   Problem: Nutrition: Goal: Adequate nutrition will be maintained Outcome: Adequate for Discharge   Problem: Coping: Goal: Level of anxiety will decrease Outcome: Adequate for Discharge   Problem: Elimination: Goal: Will not experience complications related to bowel motility Outcome: Adequate for Discharge Goal: Will not experience complications related to urinary retention Outcome: Adequate for Discharge   Problem: Pain Managment: Goal: General experience of comfort will improve Outcome: Adequate for Discharge   Problem: Safety: Goal: Ability to remain free from injury will improve Outcome: Adequate for Discharge   Problem: Skin  Integrity: Goal: Risk for impaired skin integrity will decrease Outcome: Adequate for Discharge

## 2020-05-21 ENCOUNTER — Other Ambulatory Visit: Payer: Self-pay

## 2020-05-21 ENCOUNTER — Inpatient Hospital Stay (HOSPITAL_COMMUNITY)
Admission: EM | Admit: 2020-05-21 | Discharge: 2020-05-26 | DRG: 378 | Disposition: A | Discharge status: Skilled Nursing Facility | Payer: Medicare Other | Source: Skilled Nursing Facility | Attending: Internal Medicine | Admitting: Internal Medicine

## 2020-05-21 ENCOUNTER — Encounter (HOSPITAL_COMMUNITY): Payer: Self-pay

## 2020-05-21 DIAGNOSIS — N183 Chronic kidney disease, stage 3 unspecified: Secondary | ICD-10-CM | POA: Diagnosis present

## 2020-05-21 DIAGNOSIS — E1169 Type 2 diabetes mellitus with other specified complication: Secondary | ICD-10-CM | POA: Diagnosis not present

## 2020-05-21 DIAGNOSIS — E113293 Type 2 diabetes mellitus with mild nonproliferative diabetic retinopathy without macular edema, bilateral: Secondary | ICD-10-CM | POA: Diagnosis present

## 2020-05-21 DIAGNOSIS — H35039 Hypertensive retinopathy, unspecified eye: Secondary | ICD-10-CM | POA: Diagnosis present

## 2020-05-21 DIAGNOSIS — K635 Polyp of colon: Secondary | ICD-10-CM | POA: Diagnosis present

## 2020-05-21 DIAGNOSIS — K31811 Angiodysplasia of stomach and duodenum with bleeding: Secondary | ICD-10-CM | POA: Diagnosis present

## 2020-05-21 DIAGNOSIS — K922 Gastrointestinal hemorrhage, unspecified: Secondary | ICD-10-CM | POA: Diagnosis not present

## 2020-05-21 DIAGNOSIS — K5900 Constipation, unspecified: Secondary | ICD-10-CM | POA: Diagnosis not present

## 2020-05-21 DIAGNOSIS — I639 Cerebral infarction, unspecified: Secondary | ICD-10-CM | POA: Diagnosis not present

## 2020-05-21 DIAGNOSIS — E119 Type 2 diabetes mellitus without complications: Secondary | ICD-10-CM | POA: Diagnosis not present

## 2020-05-21 DIAGNOSIS — F1010 Alcohol abuse, uncomplicated: Secondary | ICD-10-CM | POA: Diagnosis present

## 2020-05-21 DIAGNOSIS — K621 Rectal polyp: Secondary | ICD-10-CM | POA: Diagnosis present

## 2020-05-21 DIAGNOSIS — K219 Gastro-esophageal reflux disease without esophagitis: Secondary | ICD-10-CM | POA: Diagnosis present

## 2020-05-21 DIAGNOSIS — Z8673 Personal history of transient ischemic attack (TIA), and cerebral infarction without residual deficits: Secondary | ICD-10-CM | POA: Diagnosis present

## 2020-05-21 DIAGNOSIS — K5909 Other constipation: Secondary | ICD-10-CM | POA: Diagnosis present

## 2020-05-21 DIAGNOSIS — N1831 Chronic kidney disease, stage 3a: Secondary | ICD-10-CM | POA: Diagnosis present

## 2020-05-21 DIAGNOSIS — I152 Hypertension secondary to endocrine disorders: Secondary | ICD-10-CM | POA: Diagnosis present

## 2020-05-21 DIAGNOSIS — I69320 Aphasia following cerebral infarction: Secondary | ICD-10-CM

## 2020-05-21 DIAGNOSIS — Z880 Allergy status to penicillin: Secondary | ICD-10-CM

## 2020-05-21 DIAGNOSIS — N179 Acute kidney failure, unspecified: Secondary | ICD-10-CM | POA: Diagnosis not present

## 2020-05-21 DIAGNOSIS — E785 Hyperlipidemia, unspecified: Secondary | ICD-10-CM | POA: Diagnosis not present

## 2020-05-21 DIAGNOSIS — K648 Other hemorrhoids: Secondary | ICD-10-CM | POA: Diagnosis present

## 2020-05-21 DIAGNOSIS — Z7982 Long term (current) use of aspirin: Secondary | ICD-10-CM

## 2020-05-21 DIAGNOSIS — Z79899 Other long term (current) drug therapy: Secondary | ICD-10-CM

## 2020-05-21 DIAGNOSIS — R4189 Other symptoms and signs involving cognitive functions and awareness: Secondary | ICD-10-CM | POA: Diagnosis present

## 2020-05-21 DIAGNOSIS — K2970 Gastritis, unspecified, without bleeding: Secondary | ICD-10-CM | POA: Diagnosis present

## 2020-05-21 DIAGNOSIS — E118 Type 2 diabetes mellitus with unspecified complications: Secondary | ICD-10-CM

## 2020-05-21 DIAGNOSIS — Z7951 Long term (current) use of inhaled steroids: Secondary | ICD-10-CM

## 2020-05-21 DIAGNOSIS — D649 Anemia, unspecified: Secondary | ICD-10-CM | POA: Diagnosis present

## 2020-05-21 DIAGNOSIS — Z720 Tobacco use: Secondary | ICD-10-CM | POA: Diagnosis present

## 2020-05-21 DIAGNOSIS — E1159 Type 2 diabetes mellitus with other circulatory complications: Secondary | ICD-10-CM | POA: Diagnosis present

## 2020-05-21 DIAGNOSIS — D1779 Benign lipomatous neoplasm of other sites: Secondary | ICD-10-CM | POA: Diagnosis present

## 2020-05-21 DIAGNOSIS — Z789 Other specified health status: Secondary | ICD-10-CM

## 2020-05-21 DIAGNOSIS — I129 Hypertensive chronic kidney disease with stage 1 through stage 4 chronic kidney disease, or unspecified chronic kidney disease: Secondary | ICD-10-CM | POA: Diagnosis present

## 2020-05-21 DIAGNOSIS — E1122 Type 2 diabetes mellitus with diabetic chronic kidney disease: Secondary | ICD-10-CM | POA: Diagnosis present

## 2020-05-21 DIAGNOSIS — Z7289 Other problems related to lifestyle: Secondary | ICD-10-CM | POA: Diagnosis not present

## 2020-05-21 DIAGNOSIS — Z8249 Family history of ischemic heart disease and other diseases of the circulatory system: Secondary | ICD-10-CM

## 2020-05-21 DIAGNOSIS — F0390 Unspecified dementia without behavioral disturbance: Secondary | ICD-10-CM | POA: Diagnosis present

## 2020-05-21 DIAGNOSIS — D509 Iron deficiency anemia, unspecified: Secondary | ICD-10-CM | POA: Diagnosis not present

## 2020-05-21 DIAGNOSIS — Z8546 Personal history of malignant neoplasm of prostate: Secondary | ICD-10-CM

## 2020-05-21 DIAGNOSIS — Z86010 Personal history of colonic polyps: Secondary | ICD-10-CM

## 2020-05-21 DIAGNOSIS — Z7902 Long term (current) use of antithrombotics/antiplatelets: Secondary | ICD-10-CM

## 2020-05-21 DIAGNOSIS — F17210 Nicotine dependence, cigarettes, uncomplicated: Secondary | ICD-10-CM | POA: Diagnosis present

## 2020-05-21 DIAGNOSIS — F1090 Alcohol use, unspecified, uncomplicated: Secondary | ICD-10-CM

## 2020-05-21 DIAGNOSIS — K921 Melena: Secondary | ICD-10-CM

## 2020-05-21 DIAGNOSIS — Z20822 Contact with and (suspected) exposure to covid-19: Secondary | ICD-10-CM | POA: Diagnosis present

## 2020-05-21 DIAGNOSIS — Z7984 Long term (current) use of oral hypoglycemic drugs: Secondary | ICD-10-CM

## 2020-05-21 DIAGNOSIS — Z9079 Acquired absence of other genital organ(s): Secondary | ICD-10-CM

## 2020-05-21 DIAGNOSIS — K31819 Angiodysplasia of stomach and duodenum without bleeding: Secondary | ICD-10-CM | POA: Diagnosis not present

## 2020-05-21 DIAGNOSIS — Z79891 Long term (current) use of opiate analgesic: Secondary | ICD-10-CM

## 2020-05-21 DIAGNOSIS — N184 Chronic kidney disease, stage 4 (severe): Secondary | ICD-10-CM | POA: Diagnosis present

## 2020-05-21 LAB — CBC WITH DIFFERENTIAL/PLATELET
Abs Immature Granulocytes: 0.02 10*3/uL (ref 0.00–0.07)
Basophils Absolute: 0 10*3/uL (ref 0.0–0.1)
Basophils Relative: 0 %
Eosinophils Absolute: 0 10*3/uL (ref 0.0–0.5)
Eosinophils Relative: 1 %
HCT: 12.4 % — ABNORMAL LOW (ref 39.0–52.0)
Hemoglobin: 3.6 g/dL — CL (ref 13.0–17.0)
Immature Granulocytes: 1 %
Lymphocytes Relative: 20 %
Lymphs Abs: 0.9 10*3/uL (ref 0.7–4.0)
MCH: 27.1 pg (ref 26.0–34.0)
MCHC: 29 g/dL — ABNORMAL LOW (ref 30.0–36.0)
MCV: 93.2 fL (ref 80.0–100.0)
Monocytes Absolute: 0.4 10*3/uL (ref 0.1–1.0)
Monocytes Relative: 9 %
Neutro Abs: 3.1 10*3/uL (ref 1.7–7.7)
Neutrophils Relative %: 69 %
Platelets: 184 10*3/uL (ref 150–400)
RBC: 1.33 MIL/uL — ABNORMAL LOW (ref 4.22–5.81)
RDW: 14.8 % (ref 11.5–15.5)
WBC: 4.4 10*3/uL (ref 4.0–10.5)
nRBC: 0.5 % — ABNORMAL HIGH (ref 0.0–0.2)

## 2020-05-21 LAB — RETICULOCYTES
Immature Retic Fract: 27.1 % — ABNORMAL HIGH (ref 2.3–15.9)
RBC.: 1.32 MIL/uL — ABNORMAL LOW (ref 4.22–5.81)
Retic Count, Absolute: 68 10*3/uL (ref 19.0–186.0)
Retic Ct Pct: 5.2 % — ABNORMAL HIGH (ref 0.4–3.1)

## 2020-05-21 LAB — BASIC METABOLIC PANEL
Anion gap: 9 (ref 5–15)
BUN: 33 mg/dL — ABNORMAL HIGH (ref 8–23)
CO2: 20 mmol/L — ABNORMAL LOW (ref 22–32)
Calcium: 8.1 mg/dL — ABNORMAL LOW (ref 8.9–10.3)
Chloride: 110 mmol/L (ref 98–111)
Creatinine, Ser: 1.78 mg/dL — ABNORMAL HIGH (ref 0.61–1.24)
GFR, Estimated: 40 mL/min — ABNORMAL LOW (ref >60)
Glucose, Bld: 364 mg/dL — ABNORMAL HIGH (ref 70–99)
Potassium: 4.3 mmol/L (ref 3.5–5.1)
Sodium: 139 mmol/L (ref 135–145)

## 2020-05-21 LAB — PROTIME-INR
INR: 1.2 (ref 0.8–1.2)
Prothrombin Time: 14.6 seconds (ref 11.4–15.2)

## 2020-05-21 LAB — IRON AND TIBC
Iron: 11 ug/dL — ABNORMAL LOW (ref 45–182)
Saturation Ratios: 2 % — ABNORMAL LOW (ref 17.9–39.5)
TIBC: 449 ug/dL (ref 250–450)
UIBC: 438 ug/dL

## 2020-05-21 LAB — FOLATE: Folate: 7.1 ng/mL (ref >5.9)

## 2020-05-21 LAB — HEMOGLOBIN AND HEMATOCRIT, BLOOD
HCT: 17.4 % — ABNORMAL LOW (ref 39.0–52.0)
Hemoglobin: 5.4 g/dL — CL (ref 13.0–17.0)

## 2020-05-21 LAB — CBG MONITORING, ED
Glucose-Capillary: 361 mg/dL — ABNORMAL HIGH (ref 70–99)
Glucose-Capillary: 301 mg/dL — ABNORMAL HIGH (ref 70–99)

## 2020-05-21 LAB — POC OCCULT BLOOD, ED: Fecal Occult Bld: POSITIVE — AB

## 2020-05-21 LAB — SARS CORONAVIRUS 2 (TAT 6-24 HRS): SARS Coronavirus 2: NEGATIVE

## 2020-05-21 LAB — VITAMIN B12: Vitamin B-12: 504 pg/mL (ref 180–914)

## 2020-05-21 LAB — FERRITIN: Ferritin: 11 ng/mL — ABNORMAL LOW (ref 24–336)

## 2020-05-21 LAB — PREPARE RBC (CROSSMATCH)

## 2020-05-21 MED ORDER — SODIUM CHLORIDE 0.9 % IV BOLUS
1000.0000 mL | Freq: Once | INTRAVENOUS | Status: AC
  Administered 2020-05-21: 1000 mL via INTRAVENOUS

## 2020-05-21 MED ORDER — ATORVASTATIN CALCIUM 40 MG PO TABS
40.0000 mg | ORAL_TABLET | Freq: Every day | ORAL | Status: DC
  Administered 2020-05-22 – 2020-05-25 (×5): 40 mg via ORAL
  Filled 2020-05-21 (×5): qty 1

## 2020-05-21 MED ORDER — ONDANSETRON HCL 4 MG PO TABS: 4.0000 mg | ORAL_TABLET | Freq: Four times a day (QID) | ORAL | Status: DC | PRN

## 2020-05-21 MED ORDER — ACETAMINOPHEN 650 MG RE SUPP: 650.0000 mg | Freq: Four times a day (QID) | RECTAL | Status: DC | PRN

## 2020-05-21 MED ORDER — PANTOPRAZOLE SODIUM 40 MG IV SOLR
40.0000 mg | Freq: Once | INTRAVENOUS | Status: AC
  Administered 2020-05-21: 40 mg via INTRAVENOUS
  Filled 2020-05-21: qty 40

## 2020-05-21 MED ORDER — FOLIC ACID 1 MG PO TABS
1.0000 mg | ORAL_TABLET | Freq: Every day | ORAL | Status: DC
  Administered 2020-05-22 – 2020-05-26 (×6): 1 mg via ORAL
  Filled 2020-05-21 (×6): qty 1

## 2020-05-21 MED ORDER — INSULIN ASPART 100 UNIT/ML ~~LOC~~ SOLN
0.0000 [IU] | Freq: Three times a day (TID) | SUBCUTANEOUS | Status: DC
  Administered 2020-05-22: 2 [IU] via SUBCUTANEOUS
  Administered 2020-05-22 (×2): 3 [IU] via SUBCUTANEOUS
  Administered 2020-05-23: 2 [IU] via SUBCUTANEOUS
  Filled 2020-05-21: qty 0.09

## 2020-05-21 MED ORDER — ADULT MULTIVITAMIN W/MINERALS CH
1.0000 | ORAL_TABLET | Freq: Every day | ORAL | Status: DC
  Administered 2020-05-22 – 2020-05-26 (×6): 1 via ORAL
  Filled 2020-05-21 (×6): qty 1

## 2020-05-21 MED ORDER — SODIUM CHLORIDE 0.9 % IV SOLN: INTRAVENOUS | Status: DC

## 2020-05-21 MED ORDER — INSULIN ASPART 100 UNIT/ML ~~LOC~~ SOLN
0.0000 [IU] | Freq: Every day | SUBCUTANEOUS | Status: DC
  Administered 2020-05-21: 4 [IU] via SUBCUTANEOUS
  Filled 2020-05-21: qty 0.05

## 2020-05-21 MED ORDER — ONDANSETRON HCL 4 MG/2ML IJ SOLN: 4.0000 mg | Freq: Four times a day (QID) | INTRAMUSCULAR | Status: DC | PRN

## 2020-05-21 MED ORDER — ALBUTEROL SULFATE HFA 108 (90 BASE) MCG/ACT IN AERS
2.0000 | INHALATION_SPRAY | Freq: Four times a day (QID) | RESPIRATORY_TRACT | Status: DC | PRN
  Filled 2020-05-21: qty 6.7

## 2020-05-21 MED ORDER — PANTOPRAZOLE SODIUM 40 MG IV SOLR
40.0000 mg | Freq: Two times a day (BID) | INTRAVENOUS | Status: DC
  Administered 2020-05-22 (×2): 40 mg via INTRAVENOUS
  Filled 2020-05-21 (×2): qty 40

## 2020-05-21 MED ORDER — ACETAMINOPHEN 325 MG PO TABS
650.0000 mg | ORAL_TABLET | Freq: Four times a day (QID) | ORAL | Status: DC | PRN
  Administered 2020-05-22: 650 mg via ORAL
  Filled 2020-05-21: qty 2

## 2020-05-21 MED ORDER — SODIUM CHLORIDE 0.9 % IV SOLN
10.0000 mL/h | Freq: Once | INTRAVENOUS | Status: AC
  Administered 2020-05-21: 10 mL/h via INTRAVENOUS

## 2020-05-21 MED ORDER — THIAMINE HCL 100 MG PO TABS
100.0000 mg | ORAL_TABLET | Freq: Every day | ORAL | Status: DC
  Administered 2020-05-22 – 2020-05-26 (×6): 100 mg via ORAL
  Filled 2020-05-21 (×6): qty 1

## 2020-05-21 NOTE — Progress Notes (Signed)
We are aware of consult.  Will see the patient in the morning.  [place] for clear liquids from GI standpoint. 

## 2020-05-21 NOTE — H&P (View-Only) (Signed)
We are aware of consult.  Will see the patient in the morning.  [place] for clear liquids from GI standpoint.

## 2020-05-21 NOTE — ED Provider Notes (Addendum)
[facility] DEPT Provider Note   CSN: [id number] Arrival date & time: 05/21/20  1319     History Chief Complaint  Patient presents with  . Anemia    Gregory Nunez. is a 75 y.o. male.  The history is provided by the patient and medical records. No language interpreter was used.  Anemia     75 year old male significant history of prostate cancer, diabetes, depression, hypertension brought here via EMS from a nursing facility for managements of anemia.  Patient is a poor historian.  Patient does not have any specific complaint.  He was sent here due to a hemoglobin of 3.6 at the facility.  He did admits to noticing black tarry stool for the past 3 to 4 days.  No complaints of fever chills no lightheadedness or dizziness no chest pain or shortness of breath no abdominal pain no nausea vomiting or diarrhea no dysuria and denies any other abnormal bleeding.  Denies any recent alcohol use.  He is currently on Plavix and aspirin.  He does have history of prostate cancer.  Past Medical History:  Diagnosis Date  . Cancer [facility])    prostate  . Cataract    OD  . Depression   . Diabetes mellitus without complication ([place])   . Diabetic retinopathy ([place])    NPDR OU  . GERD (gastroesophageal reflux disease)    if drinks alcohol  . 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 Hyperlipidemia   . Hypertension   . Hypertensive retinopathy    OU  . Malignant neoplasm of prostate ([place]) 01/09/2014  . Pancreatitis   . Prostate cancer ([place]) 12/19/13   Gleason 4+3=7, volume 31.31 cc  . Shortness of breath dyspnea    with exertion     Patient Active Problem List   Diagnosis Date Noted  . Tobacco abuse   . 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 ([place])   . Acute CVA (cerebrovascular accident) ([place]) 04/23/2020  . Hyperlipidemia associated with type 2 diabetes mellitus  ([place]) 04/23/2020  . Alcohol use 04/23/2020  . AKI (acute kidney injury) ([place]) 04/23/2020  . Tobacco use 04/23/2020  . Unresponsive 11/08/2018  . Syncope, convulsive 11/08/2018  . Hypertension associated with diabetes ([place]) 11/08/2018  . Alcohol withdrawal seizure with complication, with unspecified complication ([place]) 16/60/6301  . Type 2 diabetes mellitus without complication ([place]) 60/01/9322  . Symptomatic anemia 04/15/2018  . Prostate cancer ([place]) 02/27/2014  . Malignant neoplasm of prostate ([place]) 01/09/2014  . Bunion 01/17/2013  . 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 BIOPSY  04/16/2018   Procedure: BIOPSY;  Surgeon: Juanita Craver, MD;  Location: [facility] ENDOSCOPY;  Service: Endoscopy;;  . biopsy on throat     hx of   . CATARACT EXTRACTION Bilateral    Dr. Quentin Ore  . ESOPHAGOGASTRODUODENOSCOPY Left 04/16/2018   Procedure: ESOPHAGOGASTRODUODENOSCOPY (EGD);  Surgeon: Juanita Craver, MD;  Location: [facility];  Service: Endoscopy;  Laterality: Left;  . EYE SURGERY    . FOOT SURGERY    . HOT HEMOSTASIS N/A 04/16/2018   Procedure: HOT HEMOSTASIS (ARGON PLASMA COAGULATION/BICAP);  Surgeon: Juanita Craver, MD;  Location: [facility];  Service: Endoscopy;  Laterality: N/A;  . LYMPHADENECTOMY Bilateral 02/27/2014   Procedure: BILATERAL LYMPHADENECTOMY;  Surgeon: Alexis Frock, MD;  Location: [facility] ORS;  Service: Urology;  Laterality: Bilateral;  . PROSTATE BIOPSY  12/2013   Gleason 4+3=7, volume 31.31 cc  . ROBOT ASSISTED LAPAROSCOPIC RADICAL PROSTATECTOMY N/A 02/27/2014   Procedure: ROBOTIC ASSISTED LAPAROSCOPIC RADICAL PROSTATECTOMY WITH INDOCYANINE GREEN DYE;  Surgeon: Alexis Frock, MD;  Location: [facility];  Service: Urology;  Laterality: N/A;       Family History  Problem Relation Age of Onset  . Heart disease Mother   . Cancer Sister        breast  . Colon cancer Neg Hx   . Esophageal cancer Neg Hx   . Rectal cancer Neg  Hx   . Stomach cancer Neg Hx     Social History   Tobacco Use  . Smoking status: Current Every Day Smoker    Packs/day: 0.50  . Smokeless tobacco: Never Used  . Tobacco comment: smokes a couple every day or two  Vaping Use  . Vaping Use: Never used  Substance Use Topics  . Alcohol use: Not Currently    Comment: Patient states no alcohol in 2 years.   . Drug use: Not Currently    Types: Marijuana, Cocaine    Comment: past hx approx 30 years ago     Home Medications Prior to Admission medications   Medication Sig Start Date End Date Taking? Authorizing Provider  albuterol (VENTOLIN HFA) 108 (90 Base) MCG/ACT inhaler Inhale 2 puffs into the lungs every 6 (six) hours as needed for wheezing or shortness of breath. 08/21/19   [provider]  amLODipine (NORVASC) 10 MG tablet Take 10 mg by mouth daily. 09/30/19   [provider]  aspirin EC 81 MG EC tablet Take 1 tablet (81 mg total) by mouth daily. Swallow whole. 04/25/20 07/24/20  [place] Donnamarie Poag, DO  atorvastatin (LIPITOR) 40 MG tablet Take 1 tablet (40 mg total) by mouth daily. 04/25/20 07/24/20  [place] Donnamarie Poag, DO  carvedilol (COREG) 25 MG tablet Take 1 tablet (25 mg total) by mouth 2 (two) times daily with a meal. 05/08/20   [name], Rinka R, MD  metFORMIN (GLUCOPHAGE) 500 MG tablet Take 500 mg by mouth 2 (two) times daily. 02/13/19   [provider]  nicotine (NICODERM CQ - DOSED IN MG/24 HOURS) 21 mg/24hr patch Place 1 patch (21 mg total) onto the skin daily. 05/09/20   [name], Michell Heinrich, MD  pantoprazole (PROTONIX) 40 MG tablet Take 1 tablet (40 mg total) by mouth daily. 04/17/18 08/19/19  Dana Allan I, MD  polyethylene glycol (MIRALAX / GLYCOLAX) 17 g packet Take 17 g by mouth daily. 05/09/20   [name], Michell Heinrich, MD  senna-docusate (SENOKOT-S) 8.6-50 MG tablet Take 2 tablets by mouth at bedtime as needed for mild constipation. 05/08/20   [name], Michell Heinrich, MD  traZODone (DESYREL) 50 MG tablet Take 50 mg by mouth  at bedtime as needed for sleep.    [provider]  COMBIVENT RESPIMAT 20-100 MCG/ACT AERS respimat Inhale 1 puff into the lungs every 6 (six) hours as needed for shortness of breath. 06/12/18 08/19/19  [provider]  ferrous sulfate 325 (65 FE) MG tablet Take 1 tablet (325 mg total) by mouth 2 (two) times daily with a meal. Patient not taking: Reported on 08/19/2019 04/17/18 08/19/19  Dana Allan I, MD  mometasone-formoterol (DULERA) 100-5 MCG/ACT AERO Inhale 2 puffs into the lungs daily. Patient not taking: Reported on 08/19/2019 04/17/18 08/19/19  Dana Allan I, MD  sucralfate (CARAFATE) 1 g tablet Take 1 tablet (1 g total) by mouth 4 (four) times daily. Patient not taking: Reported  on 08/19/2019 04/17/18 08/19/19  Dana Allan I, MD  tiotropium (SPIRIVA HANDIHALER) 18 MCG inhalation capsule Place 1 capsule (18 mcg total) into inhaler and inhale daily. Patient not taking: Reported on 08/19/2019 04/17/18 08/19/19  Dana Allan I, MD    Allergies    Penicillins and Penicillins  Review of Systems   Review of Systems  Unable to perform ROS: Dementia    Physical Exam Updated Vital Signs BP 126/68   Pulse 87   Temp 99 F (37.2 C) (Oral)   Resp 16   SpO2 100%   Physical Exam Vitals and nursing note reviewed.  Constitutional:      General: He is not in acute distress.    Appearance: He is well-developed and well-nourished.  HENT:     Head: Atraumatic.  Eyes:     Conjunctiva/sclera: Conjunctivae normal.  Cardiovascular:     Rate and Rhythm: Normal rate and regular rhythm.     Pulses: Normal pulses.     Heart sounds: Normal heart sounds.  Pulmonary:     Effort: Pulmonary effort is normal.     Breath sounds: Normal breath sounds.  Abdominal:     Palpations: Abdomen is soft.     Tenderness: There is no abdominal tenderness.  Genitourinary:    Comments: John, RN available to chaperone.  Normal rectal tone, no obvious mass, melanotic stool noted on glove,  hemoccult positive.  Musculoskeletal:     Cervical back: Neck supple.  Skin:    Coloration: Skin is pale.     Findings: No rash.  Neurological:     Mental Status: He is alert. He is disoriented.     GCS: GCS eye subscore is 4. GCS verbal subscore is 5. GCS motor subscore is 6.  Psychiatric:        Mood and Affect: Mood and affect and mood normal.     ED Results / Procedures / Treatments   Labs (all labs ordered are listed, but only abnormal results are displayed) Labs Reviewed  BASIC METABOLIC PANEL - Abnormal; Notable for the following components:      Result Value   CO2 20 (*)    Glucose, Bld 364 (*)    BUN 33 (*)    Creatinine, Ser 1.78 (*)    Calcium 8.1 (*)    GFR, Estimated 40 (*)    All other components within normal limits  CBC WITH DIFFERENTIAL/PLATELET - Abnormal; Notable for the following components:   RBC 1.33 (*)    Hemoglobin 3.6 (*)    HCT 12.4 (*)    MCHC 29.0 (*)    nRBC 0.5 (*)    All other components within normal limits  RETICULOCYTES - Abnormal; Notable for the following components:   Retic Ct Pct 5.2 (*)    RBC. 1.32 (*)    Immature Retic Fract 27.1 (*)    All other components within normal limits  POC OCCULT BLOOD, ED - Abnormal; Notable for the following components:   Fecal Occult Bld POSITIVE (*)    All other components within normal limits  CBG MONITORING, ED - Abnormal; Notable for the following components:   Glucose-Capillary 361 (*)    All other components within normal limits  SARS CORONAVIRUS 2 (TAT 6-24 HRS)  PROTIME-INR  FOLATE  VITAMIN B12  IRON AND TIBC  FERRITIN  TYPE AND SCREEN  PREPARE RBC (CROSSMATCH)    EKG None  Radiology No results found.  Procedures .Critical Care Performed by: Domenic Moras, PA-C Authorized by:  Domenic Moras, PA-C   Critical care provider statement:    Critical care time (minutes):  37   Critical care was time spent personally by me on the following activities:  Discussions with consultants,  evaluation of patient's response to treatment, examination of patient, ordering and performing treatments and interventions, ordering and review of laboratory studies, ordering and review of radiographic studies, pulse oximetry, re-evaluation of patient's condition, obtaining history from patient or surrogate and review of old charts     Medications Ordered in ED Medications  0.9 %  sodium chloride infusion (has no administration in time range)  pantoprazole (PROTONIX) injection 40 mg (40 mg Intravenous Given 05/21/20 1416)  sodium chloride 0.9 % bolus 1,000 mL (1,000 mLs Intravenous New Bag/Given 05/21/20 1416)    ED Course  I have reviewed the triage vital signs and the nursing notes.  Pertinent labs & imaging results that were available during my care of the patient were reviewed by me and considered in my medical decision making (see chart for details).    MDM Rules/Calculators/A&P                          BP 125/68   Pulse 81   Temp 99 F (37.2 C) (Oral)   Resp 12   SpO2 100%   Final Clinical Impression(s) / ED Diagnoses Final diagnoses:  Upper GI bleed  Symptomatic anemia  Melena    Rx / DC Orders ED Discharge Orders    None     1:55 PM Patient sent here due to having a hemoglobin of 3.6 at his skilled nursing facility today.  He did admit to having black tarry stool for the past several days.  He is currently on Plavix and aspirin.  On exam he has a fairly benign abdominal exam however on rectal exam he does have black tarry stool concerning for upper GI bleed.  We will give Protonix, blood products, fluid, and will consult GI and medicine for admission.  He has been seen by the [facility] gastroenterology (Dr. Havery Moros) in the past.  CAre discussed with Dr. Roslynn Amble.  2:51 PM Hemoglobin obtained here is 3.6.  This is likely related to his upper GI bleed.  Appreciate consultation from [place] who agrees to be involved in patient care.  Will consult medicine for  admission.  Patient to receive blood product.  3:00 PM I have consulted [facility] Dr. Marylyn Ishihara who agrees to see and admit pt for further management of his UGIB.  Screening covid test ordered.    Domenic Moras, PA-C 05/21/20 1501    Domenic Moras, PA-C 05/21/20 1508    Lucrezia Starch, MD 05/21/20 [phone]

## 2020-05-21 NOTE — ED Triage Notes (Signed)
EMS reports from [facility], called out for anemia, lab results today HGB 3.6. Hx of CVA, hypertension, diabetes.  BP120/86 HR 80 RR 18 Sp02 100 RA CBG 525 18 LAC 422ml NS enroute

## 2020-05-21 NOTE — ED Notes (Signed)
3.7 hemoglobin critical value called to Domenic Moras, PA.

## 2020-05-21 NOTE — H&P (Signed)
History and Physical    Gregory Nunez. [id number] DOB: [birth year] DOA: 05/21/2020  PCP: [name], MD  Patient coming from: [facility]  Chief Complaint: dark stools  HPI: Gregory Schramm. is a 75 y.o. male with medical history significant of prostate cancer, DM2, HLD, HTN. Presenting with symptomatic anemia. Attempted call to [employer], but unable to reach nursing staff. Attempted call to pt's nephew but received VM only. Call to pt's brother made. He is aware that patient is in hospital but unaware of the circumstances leading to this hospitalization. Patient is a poor historian. HPI from chart review. Patient has apparently been having multiple dark stools over the last several days. The medical staff at [facility] checked labs and found him to be severely anemic. EMS was called for transfer to ED.   ED Course: Found to have an Hgb of 3.6. LBGI was consulted. 3 units pRBCs ordered. [facility] was called for admission.   Review of Systems:  Denies CP, palpitations, dyspnea. Reports weakness, dark stools. Review of systems is otherwise negative for all not mentioned in HPI.   PMHx Past Medical History:  Diagnosis Date  . Cancer [facility])    prostate  . Cataract    OD  . Depression   . Diabetes mellitus without complication ([place])   . Diabetic retinopathy ([place])    NPDR OU  . GERD (gastroesophageal reflux disease)    if drinks alcohol  . 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 Hyperlipidemia   . Hypertension   . Hypertensive retinopathy    OU  . Malignant neoplasm of prostate ([place]) 01/09/2014  . Pancreatitis   . Prostate cancer ([place]) 12/19/13   Gleason 4+3=7, volume 31.31 cc  . Shortness of breath dyspnea    with exertion     PSHx Past Surgical History:  Procedure Laterality Date  . BIOPSY  04/16/2018   Procedure: BIOPSY;  Surgeon: Juanita Craver, MD;  Location: [facility];   Service: Endoscopy;;  . biopsy on throat     hx of   . CATARACT EXTRACTION Bilateral    Dr. Quentin Ore  . ESOPHAGOGASTRODUODENOSCOPY Left 04/16/2018   Procedure: ESOPHAGOGASTRODUODENOSCOPY (EGD);  Surgeon: Juanita Craver, MD;  Location: [facility];  Service: Endoscopy;  Laterality: Left;  . EYE SURGERY    . FOOT SURGERY    . HOT HEMOSTASIS N/A 04/16/2018   Procedure: HOT HEMOSTASIS (ARGON PLASMA COAGULATION/BICAP);  Surgeon: Juanita Craver, MD;  Location: [facility];  Service: Endoscopy;  Laterality: N/A;  . LYMPHADENECTOMY Bilateral 02/27/2014   Procedure: BILATERAL LYMPHADENECTOMY;  Surgeon: Alexis Frock, MD;  Location: [facility]S;  Service: Urology;  Laterality: Bilateral;  . PROSTATE BIOPSY  12/2013   Gleason 4+3=7, volume 31.31 cc  . ROBOT ASSISTED LAPAROSCOPIC RADICAL PROSTATECTOMY N/A 02/27/2014   Procedure: ROBOTIC ASSISTED LAPAROSCOPIC RADICAL PROSTATECTOMY WITH INDOCYANINE GREEN DYE;  Surgeon: Alexis Frock, MD;  Location: [facility];  Service: Urology;  Laterality: N/A;    SocHx  reports that he has been smoking. He has been smoking about 0.50 packs per day. He has never used smokeless tobacco. He reports previous alcohol use. He reports previous drug use. Drugs: Marijuana and Cocaine.  Allergies  Allergen Reactions  . Penicillins     Nervous   . Penicillins Anxiety and Other (See Comments)    DID THE REACTION INVOLVE: Swelling of the face/tongue/throat, SOB, or low BP? No Sudden or severe rash/hives, skin peeling,  or the inside of the mouth or nose? No Did it require medical treatment? No When did it last happen?many years ago If all above answers are "NO", may proceed with cephalosporin use.       FamHx Family History  Problem Relation Age of Onset  . Heart disease Mother   . Cancer Sister        breast  . Colon cancer Neg Hx   . Esophageal cancer Neg Hx   . Rectal cancer Neg Hx   . Stomach cancer Neg Hx     Prior to Admission medications   Medication  Sig Start Date End Date Taking? Authorizing Provider  albuterol (VENTOLIN HFA) 108 (90 Base) MCG/ACT inhaler Inhale 2 puffs into the lungs every 6 (six) hours as needed for wheezing or shortness of breath. 08/21/19   [provider]  amLODipine (NORVASC) 10 MG tablet Take 10 mg by mouth daily. 09/30/19   [provider]  aspirin EC 81 MG EC tablet Take 1 tablet (81 mg total) by mouth daily. Swallow whole. 04/25/20 07/24/20  [place] Donnamarie Poag, DO  atorvastatin (LIPITOR) 40 MG tablet Take 1 tablet (40 mg total) by mouth daily. 04/25/20 07/24/20  [place] Donnamarie Poag, DO  carvedilol (COREG) 25 MG tablet Take 1 tablet (25 mg total) by mouth 2 (two) times daily with a meal. 05/08/20   [name], Rinka R, MD  metFORMIN (GLUCOPHAGE) 500 MG tablet Take 500 mg by mouth 2 (two) times daily. 02/13/19   [provider]  nicotine (NICODERM CQ - DOSED IN MG/24 HOURS) 21 mg/24hr patch Place 1 patch (21 mg total) onto the skin daily. 05/09/20   [name], Michell Heinrich, MD  pantoprazole (PROTONIX) 40 MG tablet Take 1 tablet (40 mg total) by mouth daily. 04/17/18 08/19/19  Dana Allan I, MD  polyethylene glycol (MIRALAX / GLYCOLAX) 17 g packet Take 17 g by mouth daily. 05/09/20   [name], Michell Heinrich, MD  senna-docusate (SENOKOT-S) 8.6-50 MG tablet Take 2 tablets by mouth at bedtime as needed for mild constipation. 05/08/20   [name], Michell Heinrich, MD  traZODone (DESYREL) 50 MG tablet Take 50 mg by mouth at bedtime as needed for sleep.    [provider]  COMBIVENT RESPIMAT 20-100 MCG/ACT AERS respimat Inhale 1 puff into the lungs every 6 (six) hours as needed for shortness of breath. 06/12/18 08/19/19  [provider]  ferrous sulfate 325 (65 FE) MG tablet Take 1 tablet (325 mg total) by mouth 2 (two) times daily with a meal. Patient not taking: Reported on 08/19/2019 04/17/18 08/19/19  Dana Allan I, MD  mometasone-formoterol (DULERA) 100-5 MCG/ACT AERO Inhale 2 puffs into the lungs daily. Patient not  taking: Reported on 08/19/2019 04/17/18 08/19/19  Dana Allan I, MD  sucralfate (CARAFATE) 1 g tablet Take 1 tablet (1 g total) by mouth 4 (four) times daily. Patient not taking: Reported on 08/19/2019 04/17/18 08/19/19  Dana Allan I, MD  tiotropium (SPIRIVA HANDIHALER) 18 MCG inhalation capsule Place 1 capsule (18 mcg total) into inhaler and inhale daily. Patient not taking: Reported on 08/19/2019 04/17/18 08/19/19  Bonnell Public, MD    Physical Exam: Vitals:   05/21/20 1325 05/21/20 1330 05/21/20 1400 05/21/20 1430  BP: 126/68 125/64 104/61 125/68  Pulse: 87 87 88 81  Resp: 16 19 17 12   Temp: 99 F (37.2 C)     TempSrc: Oral     SpO2: 100% 100% 100% 100%    General: 75 y.o. male resting in  bed in NAD Eyes: PERRL, normal sclera ENMT: Nares patent w/o discharge, orophaynx clear, dentition normal, ears w/o discharge/lesions/ulcers Neck: Supple, trachea midline Cardiovascular: tachy, +S1, S2, no m/g/r, equal pulses throughout Respiratory: CTABL, no w/r/r, normal WOB GI: BS+, NDNT, no masses noted, no organomegaly noted MSK: No e/c/c Skin: No rashes, bruises, ulcerations noted Neuro: A&O x 2, no focal deficits Psyc: somewhat confused and flat affect, calm/cooperative  Labs on Admission: I have personally reviewed following labs and imaging studies  CBC: Recent Labs  Lab 05/21/20 1337  WBC 4.4  NEUTROABS 3.1  HGB 3.6*  HCT 12.4*  MCV 93.2  PLT [id number]   Basic Metabolic Panel: Recent Labs  Lab 05/21/20 1337  NA 139  K 4.3  CL 110  CO2 20*  GLUCOSE 364*  BUN 33*  CREATININE 1.78*  CALCIUM 8.1*   GFR: CrCl cannot be calculated (Unknown ideal weight.). Liver Function Tests: No results for input(s): AST, ALT, ALKPHOS, BILITOT, PROT, ALBUMIN in the last 168 hours. No results for input(s): LIPASE, AMYLASE in the last 168 hours. No results for input(s): AMMONIA in the last 168 hours. Coagulation Profile: Recent Labs  Lab 05/21/20 1337  INR 1.2   Cardiac  Enzymes: No results for input(s): CKTOTAL, CKMB, CKMBINDEX, TROPONINI in the last 168 hours. BNP (last 3 results) No results for input(s): PROBNP in the last 8760 hours. HbA1C: No results for input(s): HGBA1C in the last 72 hours. CBG: Recent Labs  Lab 05/21/20 1328  GLUCAP 361*   Lipid Profile: No results for input(s): CHOL, HDL, LDLCALC, TRIG, CHOLHDL, LDLDIRECT in the last 72 hours. Thyroid Function Tests: No results for input(s): TSH, T4TOTAL, FREET4, T3FREE, THYROIDAB in the last 72 hours. Anemia Panel: Recent Labs    05/21/20 1337  RETICCTPCT 5.2*   Urine analysis:    Component Value Date/Time   COLORURINE YELLOW 04/25/2020 1242   APPEARANCEUR CLEAR 04/25/2020 1242   LABSPEC 1.010 04/25/2020 1242   PHURINE 6.0 04/25/2020 1242   GLUCOSEU 50 (A) 04/25/2020 1242   HGBUR SMALL (A) 04/25/2020 1242   BILIRUBINUR NEGATIVE 04/25/2020 1242   KETONESUR NEGATIVE 04/25/2020 1242   PROTEINUR >=300 (A) 04/25/2020 1242   UROBILINOGEN 1.0 01/10/2019 1132   NITRITE NEGATIVE 04/25/2020 1242   LEUKOCYTESUR NEGATIVE 04/25/2020 1242    Radiological Exams on Admission: No results found.  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   Status is: Inpatient  Not inpatient appropriate, will call UM team and downgrade to OBS.   Dispo: The patient is from: SNF              Anticipated d/c is to: SNF              Anticipated d/c date is: 2 days              Patient currently is not medically stable to d/c.   Difficult to place patient No  Time spent coordinating admission: 75 minutes  [facility]  If 7PM-7AM, please contact night-coverage [email or web address]  05/21/2020, 3:00 PM

## 2020-05-22 DIAGNOSIS — I152 Hypertension secondary to endocrine disorders: Secondary | ICD-10-CM

## 2020-05-22 DIAGNOSIS — Z720 Tobacco use: Secondary | ICD-10-CM

## 2020-05-22 DIAGNOSIS — Z7289 Other problems related to lifestyle: Secondary | ICD-10-CM

## 2020-05-22 DIAGNOSIS — N179 Acute kidney failure, unspecified: Secondary | ICD-10-CM

## 2020-05-22 DIAGNOSIS — I639 Cerebral infarction, unspecified: Secondary | ICD-10-CM | POA: Diagnosis not present

## 2020-05-22 DIAGNOSIS — Z86010 Personal history of colonic polyps: Secondary | ICD-10-CM | POA: Diagnosis not present

## 2020-05-22 DIAGNOSIS — D649 Anemia, unspecified: Secondary | ICD-10-CM

## 2020-05-22 DIAGNOSIS — K922 Gastrointestinal hemorrhage, unspecified: Secondary | ICD-10-CM

## 2020-05-22 DIAGNOSIS — D509 Iron deficiency anemia, unspecified: Secondary | ICD-10-CM | POA: Diagnosis present

## 2020-05-22 DIAGNOSIS — E1159 Type 2 diabetes mellitus with other circulatory complications: Secondary | ICD-10-CM

## 2020-05-22 DIAGNOSIS — K5900 Constipation, unspecified: Secondary | ICD-10-CM | POA: Diagnosis present

## 2020-05-22 DIAGNOSIS — K31819 Angiodysplasia of stomach and duodenum without bleeding: Secondary | ICD-10-CM

## 2020-05-22 DIAGNOSIS — E1169 Type 2 diabetes mellitus with other specified complication: Secondary | ICD-10-CM

## 2020-05-22 DIAGNOSIS — E785 Hyperlipidemia, unspecified: Secondary | ICD-10-CM

## 2020-05-22 DIAGNOSIS — E119 Type 2 diabetes mellitus without complications: Secondary | ICD-10-CM

## 2020-05-22 LAB — CBC
HCT: 20.9 % — ABNORMAL LOW (ref 39.0–52.0)
Hemoglobin: 6.7 g/dL — CL (ref 13.0–17.0)
MCH: 28.6 pg (ref 26.0–34.0)
MCHC: 32.1 g/dL (ref 30.0–36.0)
MCV: 89.3 fL (ref 80.0–100.0)
Platelets: 182 10*3/uL (ref 150–400)
RBC: 2.34 MIL/uL — ABNORMAL LOW (ref 4.22–5.81)
RDW: 16.1 % — ABNORMAL HIGH (ref 11.5–15.5)
WBC: 6.4 10*3/uL (ref 4.0–10.5)
nRBC: 0.3 % — ABNORMAL HIGH (ref 0.0–0.2)

## 2020-05-22 LAB — GLUCOSE, CAPILLARY
Glucose-Capillary: 202 mg/dL — ABNORMAL HIGH (ref 70–99)
Glucose-Capillary: 193 mg/dL — ABNORMAL HIGH (ref 70–99)

## 2020-05-22 LAB — COMPREHENSIVE METABOLIC PANEL
ALT: 19 U/L (ref 0–44)
AST: 23 U/L (ref 15–41)
Albumin: 2.7 g/dL — ABNORMAL LOW (ref 3.5–5.0)
Alkaline Phosphatase: 44 U/L (ref 38–126)
Anion gap: 11 (ref 5–15)
BUN: 31 mg/dL — ABNORMAL HIGH (ref 8–23)
CO2: 20 mmol/L — ABNORMAL LOW (ref 22–32)
Calcium: 8.2 mg/dL — ABNORMAL LOW (ref 8.9–10.3)
Chloride: 112 mmol/L — ABNORMAL HIGH (ref 98–111)
Creatinine, Ser: 1.68 mg/dL — ABNORMAL HIGH (ref 0.61–1.24)
GFR, Estimated: 43 mL/min — ABNORMAL LOW (ref >60)
Glucose, Bld: 164 mg/dL — ABNORMAL HIGH (ref 70–99)
Potassium: 3.9 mmol/L (ref 3.5–5.1)
Sodium: 143 mmol/L (ref 135–145)
Total Bilirubin: 0.7 mg/dL (ref 0.3–1.2)
Total Protein: 6.5 g/dL (ref 6.5–8.1)

## 2020-05-22 LAB — HEMOGLOBIN AND HEMATOCRIT, BLOOD
HCT: 21.9 % — ABNORMAL LOW (ref 39.0–52.0)
HCT: 21.4 % — ABNORMAL LOW (ref 39.0–52.0)
HCT: 22.6 % — ABNORMAL LOW (ref 39.0–52.0)
Hemoglobin: 7.1 g/dL — ABNORMAL LOW (ref 13.0–17.0)
Hemoglobin: 7 g/dL — ABNORMAL LOW (ref 13.0–17.0)
Hemoglobin: 7 g/dL — ABNORMAL LOW (ref 13.0–17.0)

## 2020-05-22 LAB — PROTIME-INR
INR: 1.2 (ref 0.8–1.2)
Prothrombin Time: 14.8 seconds (ref 11.4–15.2)

## 2020-05-22 LAB — CBG MONITORING, ED
Glucose-Capillary: 158 mg/dL — ABNORMAL HIGH (ref 70–99)
Glucose-Capillary: 237 mg/dL — ABNORMAL HIGH (ref 70–99)
Glucose-Capillary: 210 mg/dL — ABNORMAL HIGH (ref 70–99)

## 2020-05-22 MED ORDER — SODIUM CHLORIDE 0.9 % IV SOLN
8.0000 mg/h | INTRAVENOUS | Status: AC
  Administered 2020-05-22 – 2020-05-24 (×6): 8 mg/h via INTRAVENOUS
  Filled 2020-05-22 (×9): qty 80

## 2020-05-22 MED ORDER — ASPIRIN EC 81 MG PO TBEC
81.0000 mg | DELAYED_RELEASE_TABLET | Freq: Every day | ORAL | Status: DC
  Administered 2020-05-23 – 2020-05-26 (×4): 81 mg via ORAL
  Filled 2020-05-22 (×4): qty 1

## 2020-05-22 MED ORDER — PANTOPRAZOLE SODIUM 40 MG IV SOLR
40.0000 mg | Freq: Two times a day (BID) | INTRAVENOUS | Status: DC
  Administered 2020-05-25 – 2020-05-26 (×2): 40 mg via INTRAVENOUS
  Filled 2020-05-22 (×3): qty 40

## 2020-05-22 MED ORDER — INSULIN GLARGINE 100 UNIT/ML ~~LOC~~ SOLN
5.0000 [IU] | Freq: Every day | SUBCUTANEOUS | Status: DC
  Administered 2020-05-23 – 2020-05-25 (×3): 5 [IU] via SUBCUTANEOUS
  Filled 2020-05-22 (×5): qty 0.05

## 2020-05-22 MED ORDER — NICOTINE 14 MG/24HR TD PT24
14.0000 mg | MEDICATED_PATCH | Freq: Every day | TRANSDERMAL | Status: DC
  Administered 2020-05-23 – 2020-05-26 (×4): 14 mg via TRANSDERMAL
  Filled 2020-05-22 (×4): qty 1

## 2020-05-22 MED ORDER — PEG-KCL-NACL-NASULF-NA ASC-C 100 G PO SOLR
0.5000 | Freq: Once | ORAL | Status: AC
  Administered 2020-05-23: 100 g via ORAL

## 2020-05-22 MED ORDER — POLYETHYLENE GLYCOL 3350 17 G PO PACK: 17.0000 g | PACK | Freq: Every day | ORAL | Status: DC | PRN

## 2020-05-22 MED ORDER — CARVEDILOL 25 MG PO TABS
25.0000 mg | ORAL_TABLET | Freq: Two times a day (BID) | ORAL | Status: DC
  Administered 2020-05-22 – 2020-05-26 (×10): 25 mg via ORAL
  Filled 2020-05-22 (×5): qty 1
  Filled 2020-05-22: qty 8
  Filled 2020-05-22 (×4): qty 1

## 2020-05-22 MED ORDER — PEG-KCL-NACL-NASULF-NA ASC-C 100 G PO SOLR: 1.0000 | Freq: Once | ORAL | Status: DC

## 2020-05-22 MED ORDER — PEG-KCL-NACL-NASULF-NA ASC-C 100 G PO SOLR
0.5000 | Freq: Once | ORAL | Status: AC
  Administered 2020-05-22: 100 g via ORAL
  Filled 2020-05-22: qty 1

## 2020-05-22 NOTE — ED Notes (Signed)
Pt mittens taken off so he can eat breakfast tray.

## 2020-05-22 NOTE — Progress Notes (Signed)
PROGRESS NOTE    Gregory Nunez.  ZOX:[id number] DOB: 1946/03/24 DOA: 05/21/2020 PCP: [name], MD   Chief Complaint  Patient presents with  . Anemia    Brief Narrative:  Patient is a 75 year old African-American gentleman, history of prostate cancer, diabetes, hyperlipidemia, hypertension, use disorder recently hospitalized 04/23/2020>>> 05/08/2020 at [facility] for acute CVA and possible alcohol withdrawal and discharged to nursing facility on aspirin and Plavix presenting to the ED with concerns for melanotic stools.  At facility labs were obtained and patient noted to be severely anemic and patient transported to the ED.  On presentation patient noted to have a hemoglobin of 3.6.  Patient transfused 3 units packed red blood cells.  Hemoglobin currently at 7.1.  GI consultation pending.   Assessment & Plan:   Principal Problem:   GIB (gastrointestinal bleeding) Active Problems:   Symptomatic anemia   Iron deficiency anemia   Hypertension associated with diabetes ([place])   Type 2 diabetes mellitus without complication (HCC)   Acute CVA (cerebrovascular accident) ([place])   Hyperlipidemia associated with type 2 diabetes mellitus ([place])   Alcohol use   AKI (acute kidney injury) ([place])   Tobacco use   Constipation  1 concern for upper GI bleed/symptomatic anemia/iron deficiency anemia Questionable etiology.  Likely multifactorial.  Patient recent diagnosis of acute CVA discharged on aspirin and Plavix x3 weeks and then subsequently aspirin alone, history of alcohol use.  On presentation hemoglobin noted at 3.6.  Patient transfused 2 units packed red blood cells hemoglobin currently at 7.1.  Anemia panel consistent with iron deficiency anemia with iron level of 11, ferritin of 11, folate of 7.1.  Currently on clear liquids.  DC IV PPI and place on a Protonix drip.  Continue to hold antiplatelets.  Serial H&H.  Transfusion threshold hemoglobin < 7.  GI consultation pending for further  evaluation and management.  Will likely need IV iron during this hospitalization.  Follow.  2.  Acute kidney injury on chronic kidney disease stage IIIa  Likely secondary to a prerenal azotemia in the setting of GI bleed.  Patient transfused 3 units packed red blood cells.  Check a UA with cultures and sensitivities.  Hold antihypertensive medications.  Gentle hydration.  Follow.  3.  History of CVA Patient with recent history of CVA during recent hospitalization (04/23/2020-05/08/2020) and placed on DAPT 04/25/2020 which he should have completed on 05/16/2020 and to continue aspirin alone at that point.  Continue to hold antiplatelets secondary to problem #1.  Continue statin.  PT/OT.  SLP.  4.  Hypertension Continue to hold antihypertensive medications.  Resume beta-blocker.  5.  Diabetes mellitus type 2 Hemoglobin A1c 6.5 (/09/2020).  158 this morning.  Currently on clear liquids.  Continue to hold oral hypoglycemic agents.  Placed on Lantus 5 units daily.  Continue sliding scale insulin.  Follow.  6.  History of alcohol abuse with cognitive impairment thought secondary to alcohol abuse Patient with no signs of withdrawal.  Continue thiamine, folic acid, multivitamin.  7.  Chronic constipation Laxatives as needed.  8.  Tobacco abuse Place on nicotine patch.    DVT prophylaxis: SCDs Code Status: Full Family Communication: No family at bedside. Disposition:   Status is: Inpatient    Dispo: The patient is from: SNF              Anticipated d/c is to: SNF              Anticipated d/c date is: 3 to  4 days              Patient currently with concerns for GI bleed, severe anemia, GI evaluation pending.  Not stable for discharge.   Difficult to place patient undetermined       Consultants:   Gastroenterology pending  Procedures:   Transfuse 3 units packed red blood cells 05/21/2020  Antimicrobials:  None   Subjective: Laying down on gurney.  Alert to self and place..  An  expressive aphasia.  Denies any abdominal pain.  No chest pain.  No shortness of breath.  Objective: Vitals:   05/22/20 0800 05/22/20 1025 05/22/20 1200 05/22/20 1315  BP: (!) 146/71 126/74 121/89   Pulse: 97 87 77 66  Resp:  18 18   Temp: 98.8 F (37.1 C)     TempSrc: Oral     SpO2: 100% 100% 100% 100%    Intake/Output Summary (Last 24 hours) at 05/22/2020 1444 Last data filed at 05/22/2020 1158 Gross per 24 hour  Intake 4350 ml  Output 2 ml  Net 4348 ml   There were no vitals filed for this visit.  Examination:  General exam: Appears calm and comfortable  Respiratory system: Clear to auscultation. Respiratory effort normal. Cardiovascular system: S1 & S2 heard, RRR. No JVD, murmurs, rubs, gallops or clicks. No pedal edema. Gastrointestinal system: Abdomen is nondistended, soft and nontender. No organomegaly or masses felt. Normal bowel sounds heard. Central nervous system: Alert and oriented. No focal neurological deficits. Extremities: Symmetric 5 x 5 power. Skin: No rashes, lesions or ulcers Psychiatry: Judgement and insight appear poor. Mood & affect appropriate.     Data Reviewed: I have personally reviewed following labs and imaging studies  CBC: Recent Labs  Lab 05/21/20 1337 05/21/20 2132 05/22/20 0400 05/22/20 0832  WBC 4.4  --  6.4  --   NEUTROABS 3.1  --   --   --   HGB 3.6* 5.4* 6.7* 7.1*  HCT 12.4* 17.4* 20.9* 21.9*  MCV 93.2  --  89.3  --   PLT 184  --  182  --     Basic Metabolic Panel: Recent Labs  Lab 05/21/20 1337 05/22/20 0400  NA 139 143  K 4.3 3.9  CL 110 112*  CO2 20* 20*  GLUCOSE 364* 164*  BUN 33* 31*  CREATININE 1.78* 1.68*  CALCIUM 8.1* 8.2*    GFR: CrCl cannot be calculated (Unknown ideal weight.).  Liver Function Tests: Recent Labs  Lab 05/22/20 0400  AST 23  ALT 19  ALKPHOS 44  BILITOT 0.7  PROT 6.5  ALBUMIN 2.7*    CBG: Recent Labs  Lab 05/21/20 1328 05/21/20 2137 05/22/20 0804 05/22/20 1136  GLUCAP  361* 301* 158* 237*     Recent Results (from the past 240 hour(s))  SARS CORONAVIRUS 2 (TAT 6-24 HRS) Nasopharyngeal Nasopharyngeal Swab     Status: None   Collection Time: 05/21/20  1:43 PM   Specimen: Nasopharyngeal Swab  Result Value Ref Range Status   SARS Coronavirus 2 NEGATIVE NEGATIVE Final    Comment: (NOTE) SARS-CoV-2 target nucleic acids are NOT DETECTED.  The SARS-CoV-2 RNA is generally detectable in upper and lower respiratory specimens during the acute phase of infection. Negative results do not preclude SARS-CoV-2 infection, do not rule out co-infections with other pathogens, and should not be used as the sole basis for treatment or other patient management decisions. Negative results must be combined with clinical observations, patient history, and epidemiological information. The expected  result is Negative.  Fact Sheet for Patients: SugarRoll.be  Fact Sheet for Healthcare Providers: https://www.woods-mathews.com/  This test is not yet approved or cleared by the [place] FDA and  has been authorized for detection and/or diagnosis of SARS-CoV-2 by FDA under an Emergency Use Authorization (EUA). This EUA will remain  in effect (meaning this test can be used) for the duration of the COVID-19 declaration under Se ction 564(b)(1) of the Act, 21 U.S.C. section 360bbb-3(b)(1), unless the authorization is terminated or revoked sooner.  Performed at [facility], [place] [address] [place], [place] [address]          Radiology Studies: No results found.      Scheduled Meds: . atorvastatin  40 mg Oral QHS  . carvedilol  25 mg Oral BID WC  . folic acid  1 mg Oral Daily  . insulin aspart  0-5 Units Subcutaneous QHS  . insulin aspart  0-9 Units Subcutaneous TID WC  . multivitamin with minerals  1 tablet Oral Daily  . [START ON 05/25/2020] pantoprazole  40 mg Intravenous Q12H  . peg 3350 powder  1 kit Oral  Once  . thiamine  100 mg Oral Daily   Continuous Infusions: . sodium chloride 75 mL/hr at 05/22/20 0936  . pantoprozole (PROTONIX) infusion 8 mg/hr (05/22/20 0935)     LOS: 1 day    Time spent: 40 minutes    Irine Seal, MD [facility]   To contact the attending provider between 7A-7P or the covering provider during after hours 7P-7A, please log into the web site [email or web address] and access using universal [place] password for that web site. If you do not have the password, please call the hospital operator.  05/22/2020, 2:44 PM

## 2020-05-22 NOTE — ED Notes (Signed)
Date and time results received: 05/22/20  (5:11 AM)  Test: hemoglobin Critical Value: 6.7  Name of Provider Notified: on call floor coverage   Orders Received? Or Actions Taken?: action taken

## 2020-05-22 NOTE — H&P (View-Only) (Signed)
Referring Provider:  Domenic Moras, PA-C Primary Care Physician:  [name], MD Primary Gastroenterologist:  Dr. Havery Moros  Reason for Consultation:  Anemia and heme positive stools  HPI: Gregory Nunez. is a 75 y.o. male with past medical history of type 2 diabetes mellitus, hyperlipidemia, hypertension, prostate cancer, CVA just last month now on aspirin and Plavix who was brought to the hospital from his living facility after labs were performed and found him to be severely anemic.  There are also reports of black stools.  Due to the patient's stroke he has some expressive aphasia.  He denies any abdominal pain.  Hemoglobin is found to be 3.6 g.  He was heme positive.  He has received 3 units of packed red blood cells and hemoglobin is up to 7.1 g this morning.  Iron studies are low.  ASA and plavix now on hold.  He is on PPI gtt.  GI history as follows:  03/2018 EGD by Dr. Collene Mares showed the following:  - Normal appearing, widely patent esophagus and GEJ. - Diffuse chronic gastritis with a few erosions-biopsies done. 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 - Normal first portion of the duodenum and second portion of the duodenum.  Stomach, biopsy - ANTRAL-TYPE MUCOSA WITH CHRONIC GASTRITIS AND INTESTINAL METAPLASIA CONSISTENT WITH CHRONIC ATROPHIC GASTRITIS. - WARTHIN-STARRY STAIN IS NEGATIVE FOR HELICOBACTER PYLORI. - NO DYSPLASIA OR CARCINOMA.  07/2016 colonoscopy revealed the following:  - Four 4 to 5 mm polyps in the ascending colon, removed with a cold snare. Resected and retrieved. - Nine 4 to 5 mm polyps in the transverse colon, removed with a cold snare. Resected and retrieved. - Two 3 to 6 mm polyps in the descending colon, removed with a cold snare. Resected and retrieved. - One 5 mm polyp in the sigmoid colon, removed with a cold snare. Resected and retrieved. - Two 3 to 4 mm polyps in  the rectum, removed with a cold snare. Resected and retrieved. - Medium-sized lipoma in the ascending colon. - Internal hemorrhoids. - The examined portion of the ileum was normal. - The examination was otherwise normal.  Surgical [P], rectum, sigmoid, descending, transverse and ascending, polyp (18) - TUBULAR ADENOMA(S) (MULTIPLE FRAGMENTS). - SESSILE SERRATED ADENOMA(S) (MULTIPLE FRAGMENTS). - HYPERPLASTIC POLYP(S) (MULTIPLE FRAGMENTS). - HIGH GRADE DYSPLASIA IS NOT IDENTIFIED.  Recall recommended 07/2017.   Past Medical History:  Diagnosis Date  . Cancer [facility])    prostate  . Cataract    OD  . Depression   . Diabetes mellitus without complication ([place])   . Diabetic retinopathy ([place])    NPDR OU  . GERD (gastroesophageal reflux disease)    if drinks alcohol  . 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 Hyperlipidemia   . Hypertension   . Hypertensive retinopathy    OU  . Malignant neoplasm of prostate ([place]) 01/09/2014  . Pancreatitis   . Prostate cancer ([place]) 12/19/13   Gleason 4+3=7, volume 31.31 cc  . Shortness of breath dyspnea    with exertion     Past Surgical History:  Procedure Laterality Date  . BIOPSY  04/16/2018   Procedure: BIOPSY;  Surgeon: Juanita Craver, MD;  Location: [facility] ENDOSCOPY;  Service: Endoscopy;;  . biopsy on throat     hx of   . CATARACT EXTRACTION Bilateral    Dr. Quentin Ore  . ESOPHAGOGASTRODUODENOSCOPY Left 04/16/2018  Procedure: ESOPHAGOGASTRODUODENOSCOPY (EGD);  Surgeon: Juanita Craver, MD;  Location: [facility];  Service: Endoscopy;  Laterality: Left;  . EYE SURGERY    . FOOT SURGERY    . HOT HEMOSTASIS N/A 04/16/2018   Procedure: HOT HEMOSTASIS (ARGON PLASMA COAGULATION/BICAP);  Surgeon: Juanita Craver, MD;  Location: [facility];  Service: Endoscopy;  Laterality: N/A;  . LYMPHADENECTOMY Bilateral 02/27/2014   Procedure: BILATERAL LYMPHADENECTOMY;  Surgeon:  Alexis Frock, MD;  Location: [facility] ORS;  Service: Urology;  Laterality: Bilateral;  . PROSTATE BIOPSY  12/2013   Gleason 4+3=7, volume 31.31 cc  . ROBOT ASSISTED LAPAROSCOPIC RADICAL PROSTATECTOMY N/A 02/27/2014   Procedure: ROBOTIC ASSISTED LAPAROSCOPIC RADICAL PROSTATECTOMY WITH INDOCYANINE GREEN DYE;  Surgeon: Alexis Frock, MD;  Location: [facility] ORS;  Service: Urology;  Laterality: N/A;    Prior to Admission medications   Medication Sig Start Date End Date Taking? Authorizing Provider  amLODipine (NORVASC) 10 MG tablet Take 10 mg by mouth daily. 09/30/19  Yes [provider]  aspirin EC 81 MG EC tablet Take 1 tablet (81 mg total) by mouth daily. Swallow whole. 04/25/20 07/24/20 Yes [place] Eric J, DO  atorvastatin (LIPITOR) 40 MG tablet Take 1 tablet (40 mg total) by mouth daily. Patient taking differently: Take 40 mg by mouth at bedtime. 04/25/20 07/24/20 Yes [place] Eric J, DO  carvedilol (COREG) 25 MG tablet Take 1 tablet (25 mg total) by mouth 2 (two) times daily with a meal. Patient taking differently: Take 25 mg by mouth 2 (two) times daily with a meal. Hold if SBP<100, HR<60 05/08/20  Yes [name], Rinka R, MD  clopidogrel (PLAVIX) 75 MG tablet Take 75 mg by mouth daily.   Yes [provider]  Ensure Plus (ENSURE PLUS) LIQD Take 237 mLs by mouth 2 (two) times daily between meals.   Yes [provider]  metFORMIN (GLUCOPHAGE) 500 MG tablet Take 500 mg by mouth 2 (two) times daily. 02/13/19  Yes [provider]  pantoprazole (PROTONIX) 40 MG tablet Take 40 mg by mouth daily.   Yes [provider]  polyethylene glycol (MIRALAX / GLYCOLAX) 17 g packet Take 17 g by mouth daily. 05/09/20  Yes [name], Rinka R, MD  senna-docusate (SENOKOT-S) 8.6-50 MG tablet Take 2 tablets by mouth at bedtime as needed for mild constipation. Patient taking differently: Take 2 tablets by mouth at bedtime. 05/08/20  Yes [name], Rinka R, MD  albuterol (VENTOLIN HFA) 108 (90 Base)  MCG/ACT inhaler Inhale 2 puffs into the lungs every 6 (six) hours as needed for wheezing or shortness of breath. 08/21/19   [provider]  nicotine (NICODERM CQ - DOSED IN MG/24 HOURS) 21 mg/24hr patch Place 1 patch (21 mg total) onto the skin daily. 05/09/20   [name], Michell Heinrich, MD  pantoprazole (PROTONIX) 40 MG tablet Take 1 tablet (40 mg total) by mouth daily. 04/17/18 08/19/19  Dana Allan I, MD  traZODone (DESYREL) 50 MG tablet Take 50 mg by mouth at bedtime as needed for sleep.    [provider]  COMBIVENT RESPIMAT 20-100 MCG/ACT AERS respimat Inhale 1 puff into the lungs every 6 (six) hours as needed for shortness of breath. 06/12/18 08/19/19  [provider]  ferrous sulfate 325 (65 FE) MG tablet Take 1 tablet (325 mg total) by mouth 2 (two) times daily with a meal. Patient not taking: Reported on 08/19/2019 04/17/18 08/19/19  Dana Allan I, MD  mometasone-formoterol (DULERA) 100-5 MCG/ACT AERO Inhale 2 puffs into the lungs daily. Patient not taking:  Reported on 08/19/2019 04/17/18 08/19/19  Ogbata, Sylvester I, MD  sucralfate (CARAFATE) 1 g tablet Take 1 tablet (1 g total) by mouth 4 (four) times daily. Patient not taking: Reported on 08/19/2019 04/17/18 08/19/19  Ogbata, Sylvester I, MD  tiotropium (SPIRIVA HANDIHALER) 18 MCG inhalation capsule Place 1 capsule (18 mcg total) into inhaler and inhale daily. Patient not taking: Reported on 08/19/2019 04/17/18 08/19/19  Ogbata, Sylvester I, MD    Current Facility-Administered Medications  Medication Dose Route Frequency Provider Last Rate Last Admin  . 0.9 %  sodium chloride infusion   Intravenous Continuous Kyle, Tyrone A, DO 75 mL/hr at 05/22/20 0038 New Bag at 05/22/20 0038  . acetaminophen (TYLENOL) tablet 650 mg  650 mg Oral Q6H PRN Kyle, Tyrone A, DO   650 mg at 05/22/20 0041   Or  . acetaminophen (TYLENOL) suppository 650 mg  650 mg Rectal Q6H PRN Kyle, Tyrone A, DO      . albuterol (VENTOLIN HFA) 108 (90 Base)  MCG/ACT inhaler 2 puff  2 puff Inhalation Q6H PRN Kyle, Tyrone A, DO      . atorvastatin (LIPITOR) tablet 40 mg  40 mg Oral QHS Kyle, Tyrone A, DO   40 mg at 05/22/20 0052  . carvedilol (COREG) tablet 25 mg  25 mg Oral BID WC Thompson, Daniel V, MD      . folic acid (FOLVITE) tablet 1 mg  1 mg Oral Daily Kyle, Tyrone A, DO   1 mg at 05/22/20 0831  . insulin aspart (novoLOG) injection 0-5 Units  0-5 Units Subcutaneous QHS Kyle, Tyrone A, DO   4 Units at 05/21/20 2200  . insulin aspart (novoLOG) injection 0-9 Units  0-9 Units Subcutaneous TID WC Kyle, Tyrone A, DO   2 Units at 05/22/20 0822  . multivitamin with minerals tablet 1 tablet  1 tablet Oral Daily Kyle, Tyrone A, DO   1 tablet at 05/22/20 0831  . ondansetron (ZOFRAN) tablet 4 mg  4 mg Oral Q6H PRN Kyle, Tyrone A, DO       Or  . ondansetron (ZOFRAN) injection 4 mg  4 mg Intravenous Q6H PRN Kyle, Tyrone A, DO      . pantoprazole (PROTONIX) 80 mg in sodium chloride 0.9 % 100 mL (0.8 mg/mL) infusion  8 mg/hr Intravenous Continuous Thompson, Daniel V, MD      . [START ON 05/25/2020] pantoprazole (PROTONIX) injection 40 mg  40 mg Intravenous Q12H Thompson, Daniel V, MD      . thiamine tablet 100 mg  100 mg Oral Daily Kyle, Tyrone A, DO   100 mg at 05/22/20 0831   Current Outpatient Medications  Medication Sig Dispense Refill  . amLODipine (NORVASC) 10 MG tablet Take 10 mg by mouth daily.    . aspirin EC 81 MG EC tablet Take 1 tablet (81 mg total) by mouth daily. Swallow whole. 90 tablet 0  . atorvastatin (LIPITOR) 40 MG tablet Take 1 tablet (40 mg total) by mouth daily. (Patient taking differently: Take 40 mg by mouth at bedtime.) 90 tablet 0  . carvedilol (COREG) 25 MG tablet Take 1 tablet (25 mg total) by mouth 2 (two) times daily with a meal. (Patient taking differently: Take 25 mg by mouth 2 (two) times daily with a meal. Hold if SBP<100, HR<60) 60 tablet 0  . clopidogrel (PLAVIX) 75 MG tablet Take 75 mg by mouth daily.    . Ensure Plus  (ENSURE PLUS) LIQD Take 237 mLs by mouth 2 (two)   times daily between meals.    . metFORMIN (GLUCOPHAGE) 500 MG tablet Take 500 mg by mouth 2 (two) times daily.    . pantoprazole (PROTONIX) 40 MG tablet Take 40 mg by mouth daily.    . polyethylene glycol (MIRALAX / GLYCOLAX) 17 g packet Take 17 g by mouth daily. 14 each 0  . senna-docusate (SENOKOT-S) 8.6-50 MG tablet Take 2 tablets by mouth at bedtime as needed for mild constipation. (Patient taking differently: Take 2 tablets by mouth at bedtime.) 30 tablet 0  . albuterol (VENTOLIN HFA) 108 (90 Base) MCG/ACT inhaler Inhale 2 puffs into the lungs every 6 (six) hours as needed for wheezing or shortness of breath.    . nicotine (NICODERM CQ - DOSED IN MG/24 HOURS) 21 mg/24hr patch Place 1 patch (21 mg total) onto the skin daily. 28 patch 0  . pantoprazole (PROTONIX) 40 MG tablet Take 1 tablet (40 mg total) by mouth daily. 30 tablet 1  . traZODone (DESYREL) 50 MG tablet Take 50 mg by mouth at bedtime as needed for sleep.      Allergies as of 05/21/2020 - Review Complete 05/21/2020  Allergen Reaction Noted  . Penicillins  11/07/2018  . Penicillins Anxiety and Other (See Comments) 05/16/2011    Family History  Problem Relation Age of Onset  . Heart disease Mother   . Cancer Sister        breast  . Colon cancer Neg Hx   . Esophageal cancer Neg Hx   . Rectal cancer Neg Hx   . Stomach cancer Neg Hx     Social History   Socioeconomic History  . Marital status: Married    Spouse name: Not on file  . Number of children: Not on file  . Years of education: Not on file  . Highest education level: Not on file  Occupational History  . Not on file  Tobacco Use  . Smoking status: Current Every Day Smoker    Packs/day: 0.50  . Smokeless tobacco: Never Used  . Tobacco comment: smokes a couple every day or two  Vaping Use  . Vaping Use: Never used  Substance and Sexual Activity  . Alcohol use: Not Currently    Comment: Patient states no  alcohol in 2 years.   . Drug use: Not Currently    Types: Marijuana, Cocaine    Comment: past hx approx 30 years ago   . Sexual activity: Not on file  Other Topics Concern  . Not on file  Social History Narrative   ** Merged History Encounter **       Social Determinants of Health   Financial Resource Strain: Not on file  Food Insecurity: Not on file  Transportation Needs: Not on file  Physical Activity: Not on file  Stress: Not on file  Social Connections: Not on file  Intimate Partner Violence: Not on file    Review of Systems: ROS is O/W negative except as mentioned in HPI.  Physical Exam: Vital signs in last 24 hours: Temp:  [97.9 F (36.6 C)-99 F (37.2 C)] 98.8 F (37.1 C) (02/03 0800) Pulse Rate:  [70-102] 97 (02/03 0800) Resp:  [11-21] 18 (02/03 0724) BP: (95-154)/(44-109) 146/71 (02/03 0800) SpO2:  [99 %-100 %] 100 % (02/03 0800)   General:  Alert, Well-developed, well-nourished, pleasant and cooperative in NAD Head:  Normocephalic and atraumatic. Eyes:  Sclera clear, no icterus.  Conjunctiva pink. Ears:  Normal auditory acuity. Mouth:  No deformity or lesions.  Lungs:  Clear throughout to auscultation.  No wheezes, crackles, or rhonchi.  Heart:  Regular rate and rhythm; no murmurs, clicks, rubs, or gallops. Abdomen:  Soft, non-distended.  BS present.  Non-tender.     Rectal:  Deferred.  Heme positive in the ED.  Msk:  Symmetrical without gross deformities. Pulses:  Normal pulses noted. Extremities:  Without clubbing or edema. Neurologic:  Has some expressive aphasia. Skin:  Intact without significant lesions or rashes. Psych:  Alert and cooperative. Normal mood and affect.  Intake/Output from previous day: 02/02 0701 - 02/03 0700 In: 4350 [I.V.:500; Blood:2850; IV Piggyback:1000] Out: 1 [Stool:1]  Lab Results: Recent Labs    05/21/20 1337 05/21/20 2132 05/22/20 0400 05/22/20 0832  WBC 4.4  --  6.4  --   HGB 3.6* 5.4* 6.7* 7.1*  HCT 12.4*  17.4* 20.9* 21.9*  PLT 184  --  182  --    BMET Recent Labs    05/21/20 1337 05/22/20 0400  NA 139 143  K 4.3 3.9  CL 110 112*  CO2 20* 20*  GLUCOSE 364* 164*  BUN 33* 31*  CREATININE 1.78* 1.68*  CALCIUM 8.1* 8.2*   LFT Recent Labs    05/22/20 0400  PROT 6.5  ALBUMIN 2.7*  AST 23  ALT 19  ALKPHOS 44  BILITOT 0.7   PT/INR Recent Labs    05/21/20 1337 05/22/20 0400  LABPROT 14.6 14.8  INR 1.2 1.2   IMPRESSION: *Profound anemia, likely to due GI blood loss:  Hgb 3.6 grams on admission, received 3 units PRBCs and up to 7.1 grams this AM.  Was having black stools and is heme positive.  Iron studies low. *Chronic antiplatelet use with Plavix and ASA 81 mg daily for history of stroke just last month, 04/2020.  Plavix now on hold and per hospitalist he will just continue ASA from now on.  *History of multiple colon polyps, adenomatous and hyperplastic on colonoscopy 07/2016 with recall recommended 07/2017. *Histoty of small duodenal AVM in 03/2018 that was treated with APC  PLAN: -Plan for EGD and colonoscopy on 2/4.  I spoke with the patient's nephew, Gregory Nunez, via the number listed on the chart, per the patient's request, and he was agreeable for EGD and colonoscopy for his uncle. Consent obtained from him over the phone and left in endo.  Dr. Silverio Decamp, please update him after procedures. -Consider IV iron infusion as inpatient. -Monitor Hgb and transfuse further prn. -Continue PPI gtt for now.  Laban Emperor. Zehr  05/22/2020, 9:24 AM   Attending physician's note   I have taken a history, examined the patient and reviewed the chart. I agree with the Advanced Practitioner's note, impression and recommendations.  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 Ok to  continue low-dose aspirin 81 mg daily from GI standpoint  We will plan to proceed with EGD and colonoscopy for further evaluation to isolate source of potential GI bleed and blood loss  Monitor hemoglobin and transfuse as needed IV iron infusion Continue PPI  Bowel prep and n.p.o. after 9 AM  The risks and benefits as well as alternatives of endoscopic procedure(s) have been discussed and reviewed. All questions answered. The patient agrees to proceed.    The patient was provided an opportunity to ask questions and all were  answered. The patient agreed with the plan and demonstrated an understanding of the instructions.  Damaris Hippo , MD [phone]

## 2020-05-22 NOTE — Consult Note (Addendum)
Referring Provider:  Domenic Moras, PA-C Primary Care Physician:  [name], MD Primary Gastroenterologist:  Dr. Havery Moros  Reason for Consultation:  Anemia and heme positive stools  HPI: Gregory Nunez. is a 75 y.o. male with past medical history of type 2 diabetes mellitus, hyperlipidemia, hypertension, prostate cancer, CVA just last month now on aspirin and Plavix who was brought to the hospital from his living facility after labs were performed and found him to be severely anemic.  There are also reports of black stools.  Due to the patient's stroke he has some expressive aphasia.  He denies any abdominal pain.  Hemoglobin is found to be 3.6 g.  He was heme positive.  He has received 3 units of packed red blood cells and hemoglobin is up to 7.1 g this morning.  Iron studies are low.  ASA and plavix now on hold.  He is on PPI gtt.  GI history as follows:  03/2018 EGD by Dr. Collene Mares showed the following:  - Normal appearing, widely patent esophagus and GEJ. - Diffuse chronic gastritis with a few erosions-biopsies done. 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 - Normal first portion of the duodenum and second portion of the duodenum.  Stomach, biopsy - ANTRAL-TYPE MUCOSA WITH CHRONIC GASTRITIS AND INTESTINAL METAPLASIA CONSISTENT WITH CHRONIC ATROPHIC GASTRITIS. - WARTHIN-STARRY STAIN IS NEGATIVE FOR HELICOBACTER PYLORI. - NO DYSPLASIA OR CARCINOMA.  07/2016 colonoscopy revealed the following:  - Four 4 to 5 mm polyps in the ascending colon, removed with a cold snare. Resected and retrieved. - Nine 4 to 5 mm polyps in the transverse colon, removed with a cold snare. Resected and retrieved. - Two 3 to 6 mm polyps in the descending colon, removed with a cold snare. Resected and retrieved. - One 5 mm polyp in the sigmoid colon, removed with a cold snare. Resected and retrieved. - Two 3 to 4 mm polyps in  the rectum, removed with a cold snare. Resected and retrieved. - Medium-sized lipoma in the ascending colon. - Internal hemorrhoids. - The examined portion of the ileum was normal. - The examination was otherwise normal.  Surgical [P], rectum, sigmoid, descending, transverse and ascending, polyp (18) - TUBULAR ADENOMA(S) (MULTIPLE FRAGMENTS). - SESSILE SERRATED ADENOMA(S) (MULTIPLE FRAGMENTS). - HYPERPLASTIC POLYP(S) (MULTIPLE FRAGMENTS). - HIGH GRADE DYSPLASIA IS NOT IDENTIFIED.  Recall recommended 07/2017.   Past Medical History:  Diagnosis Date  . Cancer [facility])    prostate  . Cataract    OD  . Depression   . Diabetes mellitus without complication ([place])   . Diabetic retinopathy ([place])    NPDR OU  . GERD (gastroesophageal reflux disease)    if drinks alcohol  . 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 Hyperlipidemia   . Hypertension   . Hypertensive retinopathy    OU  . Malignant neoplasm of prostate ([place]) 01/09/2014  . Pancreatitis   . Prostate cancer ([place]) 12/19/13   Gleason 4+3=7, volume 31.31 cc  . Shortness of breath dyspnea    with exertion     Past Surgical History:  Procedure Laterality Date  . BIOPSY  04/16/2018   Procedure: BIOPSY;  Surgeon: Juanita Craver, MD;  Location: [facility];  Service: Endoscopy;;  . biopsy on throat     hx of   . CATARACT EXTRACTION Bilateral    Dr. Quentin Ore  . ESOPHAGOGASTRODUODENOSCOPY Left 04/16/2018  Procedure: ESOPHAGOGASTRODUODENOSCOPY (EGD);  Surgeon: Juanita Craver, MD;  Location: [facility];  Service: Endoscopy;  Laterality: Left;  . EYE SURGERY    . FOOT SURGERY    . HOT HEMOSTASIS N/A 04/16/2018   Procedure: HOT HEMOSTASIS (ARGON PLASMA COAGULATION/BICAP);  Surgeon: Juanita Craver, MD;  Location: [facility];  Service: Endoscopy;  Laterality: N/A;  . LYMPHADENECTOMY Bilateral 02/27/2014   Procedure: BILATERAL LYMPHADENECTOMY;  Surgeon:  Alexis Frock, MD;  Location: [facility];  Service: Urology;  Laterality: Bilateral;  . PROSTATE BIOPSY  12/2013   Gleason 4+3=7, volume 31.31 cc  . ROBOT ASSISTED LAPAROSCOPIC RADICAL PROSTATECTOMY N/A 02/27/2014   Procedure: ROBOTIC ASSISTED LAPAROSCOPIC RADICAL PROSTATECTOMY WITH INDOCYANINE GREEN DYE;  Surgeon: Alexis Frock, MD;  Location: [facility];  Service: Urology;  Laterality: N/A;    Prior to Admission medications   Medication Sig Start Date End Date Taking? Authorizing Provider  amLODipine (NORVASC) 10 MG tablet Take 10 mg by mouth daily. 09/30/19  Yes [provider]  aspirin EC 81 MG EC tablet Take 1 tablet (81 mg total) by mouth daily. Swallow whole. 04/25/20 07/24/20 Yes [place] Eric J, DO  atorvastatin (LIPITOR) 40 MG tablet Take 1 tablet (40 mg total) by mouth daily. Patient taking differently: Take 40 mg by mouth at bedtime. 04/25/20 07/24/20 Yes [place] Eric J, DO  carvedilol (COREG) 25 MG tablet Take 1 tablet (25 mg total) by mouth 2 (two) times daily with a meal. Patient taking differently: Take 25 mg by mouth 2 (two) times daily with a meal. Hold if SBP<100, HR<60 05/08/20  Yes [name], Rinka R, MD  clopidogrel (PLAVIX) 75 MG tablet Take 75 mg by mouth daily.   Yes [provider]  Ensure Plus (ENSURE PLUS) LIQD Take 237 mLs by mouth 2 (two) times daily between meals.   Yes [provider]  metFORMIN (GLUCOPHAGE) 500 MG tablet Take 500 mg by mouth 2 (two) times daily. 02/13/19  Yes [provider]  pantoprazole (PROTONIX) 40 MG tablet Take 40 mg by mouth daily.   Yes [provider]  polyethylene glycol (MIRALAX / GLYCOLAX) 17 g packet Take 17 g by mouth daily. 05/09/20  Yes [name], Rinka R, MD  senna-docusate (SENOKOT-S) 8.6-50 MG tablet Take 2 tablets by mouth at bedtime as needed for mild constipation. Patient taking differently: Take 2 tablets by mouth at bedtime. 05/08/20  Yes [name], Rinka R, MD  albuterol (VENTOLIN HFA) 108 (90 Base)  MCG/ACT inhaler Inhale 2 puffs into the lungs every 6 (six) hours as needed for wheezing or shortness of breath. 08/21/19   [provider]  nicotine (NICODERM CQ - DOSED IN MG/24 HOURS) 21 mg/24hr patch Place 1 patch (21 mg total) onto the skin daily. 05/09/20   [name], Michell Heinrich, MD  pantoprazole (PROTONIX) 40 MG tablet Take 1 tablet (40 mg total) by mouth daily. 04/17/18 08/19/19  Dana Allan I, MD  traZODone (DESYREL) 50 MG tablet Take 50 mg by mouth at bedtime as needed for sleep.    [provider]  COMBIVENT RESPIMAT 20-100 MCG/ACT AERS respimat Inhale 1 puff into the lungs every 6 (six) hours as needed for shortness of breath. 06/12/18 08/19/19  [provider]  ferrous sulfate 325 (65 FE) MG tablet Take 1 tablet (325 mg total) by mouth 2 (two) times daily with a meal. Patient not taking: Reported on 08/19/2019 04/17/18 08/19/19  Dana Allan I, MD  mometasone-formoterol (DULERA) 100-5 MCG/ACT AERO Inhale 2 puffs into the lungs daily. Patient not taking:  Reported on 08/19/2019 04/17/18 08/19/19  Dana Allan I, MD  sucralfate (CARAFATE) 1 g tablet Take 1 tablet (1 g total) by mouth 4 (four) times daily. Patient not taking: Reported on 08/19/2019 04/17/18 08/19/19  Dana Allan I, MD  tiotropium (SPIRIVA HANDIHALER) 18 MCG inhalation capsule Place 1 capsule (18 mcg total) into inhaler and inhale daily. Patient not taking: Reported on 08/19/2019 04/17/18 08/19/19  Bonnell Public, MD    Current Facility-Administered Medications  Medication Dose Route Frequency Provider Last Rate Last Admin  . 0.9 %  sodium chloride infusion   Intravenous Continuous Cherylann Ratel A, DO 75 mL/hr at 05/22/20 0038 New Bag at 05/22/20 0038  . acetaminophen (TYLENOL) tablet 650 mg  650 mg Oral Q6H PRN Marylyn Ishihara, Tyrone A, DO   650 mg at 05/22/20 0041   Or  . acetaminophen (TYLENOL) suppository 650 mg  650 mg Rectal Q6H PRN Marylyn Ishihara, Tyrone A, DO      . albuterol (VENTOLIN HFA) 108 (90 Base)  MCG/ACT inhaler 2 puff  2 puff Inhalation Q6H PRN Marylyn Ishihara, Tyrone A, DO      . atorvastatin (LIPITOR) tablet 40 mg  40 mg Oral QHS Kyle, Tyrone A, DO   40 mg at 05/22/20 0052  . carvedilol (COREG) tablet 25 mg  25 mg Oral BID WC Eugenie Filler, MD      . folic acid (FOLVITE) tablet 1 mg  1 mg Oral Daily Kyle, Tyrone A, DO   1 mg at 05/22/20 0831  . insulin aspart (novoLOG) injection 0-5 Units  0-5 Units Subcutaneous QHS Kyle, Tyrone A, DO   4 Units at 05/21/20 2200  . insulin aspart (novoLOG) injection 0-9 Units  0-9 Units Subcutaneous TID WC Kyle, Tyrone A, DO   2 Units at 05/22/20 [id number]  . multivitamin with minerals tablet 1 tablet  1 tablet Oral Daily Kyle, Tyrone A, DO   1 tablet at 05/22/20 0831  . ondansetron (ZOFRAN) tablet 4 mg  4 mg Oral Q6H PRN Marylyn Ishihara, Tyrone A, DO       Or  . ondansetron (ZOFRAN) injection 4 mg  4 mg Intravenous Q6H PRN Marylyn Ishihara, Tyrone A, DO      . pantoprazole (PROTONIX) 80 mg in sodium chloride 0.9 % 100 mL (0.8 mg/mL) infusion  8 mg/hr Intravenous Continuous Eugenie Filler, MD      . Derrill Memo ON 05/25/2020] pantoprazole (PROTONIX) injection 40 mg  40 mg Intravenous Q12H Eugenie Filler, MD      . thiamine tablet 100 mg  100 mg Oral Daily Kyle, Tyrone A, DO   100 mg at 05/22/20 [id number]   Current Outpatient Medications  Medication Sig Dispense Refill  . amLODipine (NORVASC) 10 MG tablet Take 10 mg by mouth daily.    Marland Kitchen aspirin EC 81 MG EC tablet Take 1 tablet (81 mg total) by mouth daily. Swallow whole. 90 tablet 0  . atorvastatin (LIPITOR) 40 MG tablet Take 1 tablet (40 mg total) by mouth daily. (Patient taking differently: Take 40 mg by mouth at bedtime.) 90 tablet 0  . carvedilol (COREG) 25 MG tablet Take 1 tablet (25 mg total) by mouth 2 (two) times daily with a meal. (Patient taking differently: Take 25 mg by mouth 2 (two) times daily with a meal. Hold if SBP<100, HR<60) 60 tablet 0  . clopidogrel (PLAVIX) 75 MG tablet Take 75 mg by mouth daily.    . Ensure Plus  (ENSURE PLUS) LIQD Take 237 mLs by mouth 2 (two)  times daily between meals.    . metFORMIN (GLUCOPHAGE) 500 MG tablet Take 500 mg by mouth 2 (two) times daily.    . pantoprazole (PROTONIX) 40 MG tablet Take 40 mg by mouth daily.    . polyethylene glycol (MIRALAX / GLYCOLAX) 17 g packet Take 17 g by mouth daily. 14 each 0  . senna-docusate (SENOKOT-S) 8.6-50 MG tablet Take 2 tablets by mouth at bedtime as needed for mild constipation. (Patient taking differently: Take 2 tablets by mouth at bedtime.) 30 tablet 0  . albuterol (VENTOLIN HFA) 108 (90 Base) MCG/ACT inhaler Inhale 2 puffs into the lungs every 6 (six) hours as needed for wheezing or shortness of breath.    . nicotine (NICODERM CQ - DOSED IN MG/24 HOURS) 21 mg/24hr patch Place 1 patch (21 mg total) onto the skin daily. 28 patch 0  . pantoprazole (PROTONIX) 40 MG tablet Take 1 tablet (40 mg total) by mouth daily. 30 tablet 1  . traZODone (DESYREL) 50 MG tablet Take 50 mg by mouth at bedtime as needed for sleep.      Allergies as of 05/21/2020 - Review Complete 05/21/2020  Allergen Reaction Noted  . Penicillins  11/07/2018  . Penicillins Anxiety and Other (See Comments) 05/16/2011    Family History  Problem Relation Age of Onset  . Heart disease Mother   . Cancer Sister        breast  . Colon cancer Neg Hx   . Esophageal cancer Neg Hx   . Rectal cancer Neg Hx   . Stomach cancer Neg Hx     Social History   Socioeconomic History  . Marital status: Married    Spouse name: Not on file  . Number of children: Not on file  . Years of education: Not on file  . Highest education level: Not on file  Occupational History  . Not on file  Tobacco Use  . Smoking status: Current Every Day Smoker    Packs/day: 0.50  . Smokeless tobacco: Never Used  . Tobacco comment: smokes a couple every day or two  Vaping Use  . Vaping Use: Never used  Substance and Sexual Activity  . Alcohol use: Not Currently    Comment: Patient states no  alcohol in 2 years.   . Drug use: Not Currently    Types: Marijuana, Cocaine    Comment: past hx approx 30 years ago   . Sexual activity: Not on file  Other Topics Concern  . Not on file  Social History Narrative   ** Merged History Encounter **       Social Determinants of Health   Financial Resource Strain: Not on file  Food Insecurity: Not on file  Transportation Needs: Not on file  Physical Activity: Not on file  Stress: Not on file  Social Connections: Not on file  Intimate Partner Violence: Not on file    Review of Systems: ROS is O/W negative except as mentioned in HPI.  Physical Exam: Vital signs in last 24 hours: Temp:  [97.9 F (36.6 C)-99 F (37.2 C)] 98.8 F (37.1 C) (02/03 0800) Pulse Rate:  [70-102] 97 (02/03 0800) Resp:  [11-21] 18 (02/03 0724) BP: (95-154)/(44-109) 146/71 (02/03 0800) SpO2:  [99 %-100 %] 100 % (02/03 0800)   General:  Alert, Well-developed, well-nourished, pleasant and cooperative in NAD Head:  Normocephalic and atraumatic. Eyes:  Sclera clear, no icterus.  Conjunctiva pink. Ears:  Normal auditory acuity. Mouth:  No deformity or lesions.  Lungs:  Clear throughout to auscultation.  No wheezes, crackles, or rhonchi.  Heart:  Regular rate and rhythm; no murmurs, clicks, rubs, or gallops. Abdomen:  Soft, non-distended.  BS present.  Non-tender.     Rectal:  Deferred.  Heme positive in the ED.  Msk:  Symmetrical without gross deformities. Pulses:  Normal pulses noted. Extremities:  Without clubbing or edema. Neurologic:  Has some expressive aphasia. Skin:  Intact without significant lesions or rashes. Psych:  Alert and cooperative. Normal mood and affect.  Intake/Output from previous day: 02/02 0701 - 02/03 0700 In: 4350 [I.V.:500; Blood:2850; IV Piggyback:1000] Out: 1 [Stool:1]  Lab Results: Recent Labs    05/21/20 1337 05/21/20 2132 05/22/20 0400 05/22/20 0832  WBC 4.4  --  6.4  --   HGB 3.6* 5.4* 6.7* 7.1*  HCT 12.4*  17.4* 20.9* 21.9*  PLT 184  --  182  --    BMET Recent Labs    05/21/20 1337 05/22/20 0400  NA 139 143  K 4.3 3.9  CL 110 112*  CO2 20* 20*  GLUCOSE 364* 164*  BUN 33* 31*  CREATININE 1.78* 1.68*  CALCIUM 8.1* 8.2*   LFT Recent Labs    05/22/20 0400  PROT 6.5  ALBUMIN 2.7*  AST 23  ALT 19  ALKPHOS 44  BILITOT 0.7   PT/INR Recent Labs    05/21/20 1337 05/22/20 0400  LABPROT 14.6 14.8  INR 1.2 1.2   IMPRESSION: *Profound anemia, likely to due GI blood loss:  Hgb 3.6 grams on admission, received 3 units PRBCs and up to 7.1 grams this AM.  Was having black stools and is heme positive.  Iron studies low. *Chronic antiplatelet use with Plavix and ASA 81 mg daily for history of stroke just last month, 04/2020.  Plavix now on hold and per hospitalist he will just continue ASA from now on.  *History of multiple colon polyps, adenomatous and hyperplastic on colonoscopy 07/2016 with recall recommended 07/2017. *Histoty of small duodenal AVM in 03/2018 that was treated with APC  PLAN: -Plan for EGD and colonoscopy on 2/4.  I spoke with the patient's nephew, Gregory Nunez, via the number listed on the chart, per the patient's request, and he was agreeable for EGD and colonoscopy for his uncle. Consent obtained from him over the phone and left in endo.  Dr. Silverio Decamp, please update him after procedures. -Consider IV iron infusion as inpatient. -Monitor Hgb and transfuse further prn. -Continue PPI gtt for now.  Laban Emperor. Zehr  05/22/2020, 9:24 AM   Attending physician's note   I have taken a history, examined the patient and reviewed the chart. I agree with the Advanced Practitioner's note, impression and recommendations.  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 Ok to  continue low-dose aspirin 81 mg daily from GI standpoint  We will plan to proceed with EGD and colonoscopy for further evaluation to isolate source of potential GI bleed and blood loss  Monitor hemoglobin and transfuse as needed IV iron infusion Continue PPI  Bowel prep and n.p.o. after 9 AM  The risks and benefits as well as alternatives of endoscopic procedure(s) have been discussed and reviewed. All questions answered. The patient agrees to proceed.    The patient was provided an opportunity to ask questions and all were  answered. The patient agreed with the plan and demonstrated an understanding of the instructions.  Damaris Hippo , MD [phone]

## 2020-05-22 NOTE — TOC Initial Note (Signed)
Transition of Care (TOC) - Initial/Assessment Note    Patient Details  Name: Gregory Nunez. MRN: [id number] Date of Birth: 03/16/1946  Transition of Care [facility]) CM/SW Contact:    Erenest Rasher, RN Phone Number: [phone] 05/22/2020, 2:34 PM  Clinical Narrative:                 TOC CM spoke to [employer], [employer] and pt can return to complete his short term rehab.  Expected Discharge Plan: Skilled Nursing Facility Barriers to Discharge: Continued Medical Work up   Patient Goals and CMS Choice Patient states their goals for this hospitalization and ongoing recovery are:: spoke to [employer] and pt may return for short term rehab      Expected Discharge Plan and Services Expected Discharge Plan: [place] In-house Referral: Clinical Social Work,[facility] Discharge Planning Services: CM Consult Post Acute Care Choice: [place] Living arrangements for the past 2 months: [place] Expected Discharge Date:  (unknown)                                    Prior Living Arrangements/Services Living arrangements for the past 2 months: [place] Lives with:: Self                   Activities of Daily Living Home Assistive Devices/Equipment: Blood pressure cuff,Hospital bed,Grab bars in shower,Grab bars around toilet,Hand-held shower hose,Scales,CBG Meter ADL Screening (condition at time of admission) Patient's cognitive ability adequate to safely complete daily activities?: Yes Is the patient deaf or have difficulty hearing?: No Does the patient have difficulty seeing, even when wearing glasses/contacts?: No Does the patient have difficulty concentrating, remembering, or making decisions?: Yes Patient able to express need for assistance with ADLs?: Yes Does the patient have difficulty dressing or bathing?: No Independently performs ADLs?: Yes  (appropriate for developmental age) Does the patient have difficulty walking or climbing stairs?: No Weakness of Legs: Both Weakness of Arms/Hands: None  Permission Sought/Granted Permission sought to share information with : Case Manager,PCP,Family Chief Financial Officer Permission granted to share information with : Yes, Verbal Permission Granted              Emotional Assessment              Admission diagnosis:  GIB (gastrointestinal bleeding) [K92.2] Patient Active Problem List   Diagnosis Date Noted  . GIB (gastrointestinal bleeding) 05/21/2020  . Tobacco abuse   . 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 ([place])   . Acute CVA (cerebrovascular accident) ([place]) 04/23/2020  . Hyperlipidemia associated with type 2 diabetes mellitus ([place]) 04/23/2020  . Alcohol use 04/23/2020  . AKI (acute kidney injury) ([place]) 04/23/2020  . Tobacco use 04/23/2020  . Unresponsive 11/08/2018  . Syncope, convulsive 11/08/2018  . Hypertension associated with diabetes ([place]) 11/08/2018  . Alcohol withdrawal seizure with complication, with unspecified complication ([place]) 29/92/4268  . Type 2 diabetes mellitus without complication ([place]) 34/19/6222  . Symptomatic anemia 04/15/2018  . Prostate cancer ([place]) 02/27/2014  . Malignant neoplasm of prostate ([place]) 01/09/2014  . Bunion 01/17/2013  . 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   PCP:  [name], MD Pharmacy:   [id number] - [place], [place] [address] Phone: [phone] Fax: [phone]     Social Determinants of Health (SDOH) Interventions  Readmission Risk Interventions No flowsheet data found.

## 2020-05-23 ENCOUNTER — Encounter (HOSPITAL_COMMUNITY)
Admission: EM | Disposition: A | Discharge status: Skilled Nursing Facility | Payer: Self-pay | Source: Skilled Nursing Facility | Attending: Internal Medicine

## 2020-05-23 ENCOUNTER — Inpatient Hospital Stay (HOSPITAL_COMMUNITY): Payer: Medicare Other | Admitting: Certified Registered Nurse Anesthetist

## 2020-05-23 ENCOUNTER — Telehealth: Payer: Self-pay

## 2020-05-23 DIAGNOSIS — I639 Cerebral infarction, unspecified: Secondary | ICD-10-CM | POA: Diagnosis not present

## 2020-05-23 DIAGNOSIS — N179 Acute kidney failure, unspecified: Secondary | ICD-10-CM | POA: Diagnosis not present

## 2020-05-23 DIAGNOSIS — K621 Rectal polyp: Secondary | ICD-10-CM

## 2020-05-23 DIAGNOSIS — K2970 Gastritis, unspecified, without bleeding: Secondary | ICD-10-CM

## 2020-05-23 DIAGNOSIS — K635 Polyp of colon: Secondary | ICD-10-CM

## 2020-05-23 DIAGNOSIS — K922 Gastrointestinal hemorrhage, unspecified: Secondary | ICD-10-CM | POA: Diagnosis not present

## 2020-05-23 DIAGNOSIS — Z7289 Other problems related to lifestyle: Secondary | ICD-10-CM | POA: Diagnosis not present

## 2020-05-23 HISTORY — PX: COLONOSCOPY: SHX5424

## 2020-05-23 HISTORY — PX: HOT HEMOSTASIS: SHX5433

## 2020-05-23 HISTORY — PX: POLYPECTOMY: SHX5525

## 2020-05-23 HISTORY — PX: ESOPHAGOGASTRODUODENOSCOPY (EGD) WITH PROPOFOL: SHX5813

## 2020-05-23 HISTORY — PX: BIOPSY: SHX5522

## 2020-05-23 LAB — GLUCOSE, CAPILLARY
Glucose-Capillary: 101 mg/dL — ABNORMAL HIGH (ref 70–99)
Glucose-Capillary: 124 mg/dL — ABNORMAL HIGH (ref 70–99)
Glucose-Capillary: 146 mg/dL — ABNORMAL HIGH (ref 70–99)
Glucose-Capillary: 167 mg/dL — ABNORMAL HIGH (ref 70–99)
Glucose-Capillary: 89 mg/dL (ref 70–99)

## 2020-05-23 LAB — BASIC METABOLIC PANEL
Anion gap: 9 (ref 5–15)
BUN: 23 mg/dL (ref 8–23)
CO2: 21 mmol/L — ABNORMAL LOW (ref 22–32)
Calcium: 7.7 mg/dL — ABNORMAL LOW (ref 8.9–10.3)
Chloride: 115 mmol/L — ABNORMAL HIGH (ref 98–111)
Creatinine, Ser: 1.46 mg/dL — ABNORMAL HIGH (ref 0.61–1.24)
GFR, Estimated: 51 mL/min — ABNORMAL LOW (ref >60)
Glucose, Bld: 208 mg/dL — ABNORMAL HIGH (ref 70–99)
Potassium: 3.9 mmol/L (ref 3.5–5.1)
Sodium: 145 mmol/L (ref 135–145)

## 2020-05-23 LAB — HEMOGLOBIN AND HEMATOCRIT, BLOOD
HCT: 21.4 % — ABNORMAL LOW (ref 39.0–52.0)
HCT: 28.7 % — ABNORMAL LOW (ref 39.0–52.0)
Hemoglobin: 6.8 g/dL — CL (ref 13.0–17.0)
Hemoglobin: 9.1 g/dL — ABNORMAL LOW (ref 13.0–17.0)

## 2020-05-23 LAB — MAGNESIUM: Magnesium: 1.8 mg/dL (ref 1.7–2.4)

## 2020-05-23 LAB — PREPARE RBC (CROSSMATCH)

## 2020-05-23 SURGERY — ESOPHAGOGASTRODUODENOSCOPY (EGD) WITH PROPOFOL
Anesthesia: Monitor Anesthesia Care

## 2020-05-23 MED ORDER — PROPOFOL 10 MG/ML IV BOLUS
INTRAVENOUS | Status: DC | PRN
  Administered 2020-05-23 (×4): 20 mg via INTRAVENOUS

## 2020-05-23 MED ORDER — DIPHENHYDRAMINE HCL 25 MG PO CAPS
25.0000 mg | ORAL_CAPSULE | Freq: Once | ORAL | Status: AC
  Administered 2020-05-23: 25 mg via ORAL
  Filled 2020-05-23: qty 1

## 2020-05-23 MED ORDER — ACETAMINOPHEN 325 MG PO TABS
650.0000 mg | ORAL_TABLET | Freq: Once | ORAL | Status: AC
  Administered 2020-05-23: 650 mg via ORAL
  Filled 2020-05-23: qty 2

## 2020-05-23 MED ORDER — CALCIUM CHLORIDE 10 % IV SOLN
INTRAVENOUS | Status: DC | PRN
  Administered 2020-05-23 (×5): 200 mg via INTRAVENOUS

## 2020-05-23 MED ORDER — PROPOFOL 500 MG/50ML IV EMUL
INTRAVENOUS | Status: DC | PRN
  Administered 2020-05-23: 120 ug/kg/min via INTRAVENOUS

## 2020-05-23 MED ORDER — FUROSEMIDE 10 MG/ML IJ SOLN
20.0000 mg | Freq: Once | INTRAMUSCULAR | Status: AC
  Administered 2020-05-23: 20 mg via INTRAVENOUS
  Filled 2020-05-23: qty 2

## 2020-05-23 MED ORDER — PHENYLEPHRINE 40 MCG/ML (10ML) SYRINGE FOR IV PUSH (FOR BLOOD PRESSURE SUPPORT)
PREFILLED_SYRINGE | INTRAVENOUS | Status: DC | PRN
  Administered 2020-05-23 (×3): 80 ug via INTRAVENOUS

## 2020-05-23 MED ORDER — LIDOCAINE HCL (CARDIAC) PF 100 MG/5ML IV SOSY
PREFILLED_SYRINGE | INTRAVENOUS | Status: DC | PRN
  Administered 2020-05-23: 100 mg via INTRAVENOUS

## 2020-05-23 MED ORDER — SODIUM CHLORIDE 0.9% IV SOLUTION: Freq: Once | INTRAVENOUS | Status: AC

## 2020-05-23 MED ORDER — SODIUM CHLORIDE 0.9% IV SOLUTION: Freq: Once | INTRAVENOUS | Status: DC

## 2020-05-23 MED ORDER — INSULIN ASPART 100 UNIT/ML ~~LOC~~ SOLN
0.0000 [IU] | SUBCUTANEOUS | Status: DC
  Administered 2020-05-23 (×2): 1 [IU] via SUBCUTANEOUS
  Administered 2020-05-24 (×3): 2 [IU] via SUBCUTANEOUS
  Administered 2020-05-25: 3 [IU] via SUBCUTANEOUS
  Administered 2020-05-25: 2 [IU] via SUBCUTANEOUS
  Administered 2020-05-25: 3 [IU] via SUBCUTANEOUS
  Administered 2020-05-25: 2 [IU] via SUBCUTANEOUS
  Administered 2020-05-26: 1 [IU] via SUBCUTANEOUS
  Administered 2020-05-26: 2 [IU] via SUBCUTANEOUS
  Administered 2020-05-26: 1 [IU] via SUBCUTANEOUS

## 2020-05-23 MED ORDER — LACTATED RINGERS IV SOLN: INTRAVENOUS | Status: DC | PRN

## 2020-05-23 SURGICAL SUPPLY — 25 items

## 2020-05-23 NOTE — Progress Notes (Signed)
OT Cancellation Note  Patient Details Name: Gregory Nunez. MRN: [id number] DOB: 10/14/1945   Cancelled Treatment:    Reason Eval/Treat Not Completed: Medical issues which prohibited therapy. Patient with low HGB this a.m. and currently at a procedure. Will hold today and f/u as able.  Patriciann Becht L Patrik Turnbaugh 05/23/2020, 1:15 PM

## 2020-05-23 NOTE — Telephone Encounter (Signed)
-----   Message from Loralie Champagne, PA-C sent at 05/23/2020  3:34 PM EST ----- Patient needs outpatient capsule while on Plavix.  He lives in a facility.  Will probably be discharged over the weekend.  Is a patient of [facility].  Diagnosis is GI bleed and AVMs.  Thank you,  Jess

## 2020-05-23 NOTE — Progress Notes (Signed)
CRITICAL VALUE ALERT  Critical Value:  Hemoglobin 6.8  Date & Time Notied:  05/23/20 0348  Provider Notified: Foye Spurling  Orders Received/Actions taken: Administer 1 unit RBC   Candie Mile, RN

## 2020-05-23 NOTE — Progress Notes (Signed)
PROGRESS NOTE    Gregory Nunez.  [id number] DOB: 18-Jun-1945 DOA: 05/21/2020 PCP: [name], MD   Chief Complaint  Patient presents with  . Anemia    Brief Narrative:  Patient is a 75 year old African-American gentleman, history of prostate cancer, diabetes, hyperlipidemia, hypertension, use disorder recently hospitalized 04/23/2020>>> 05/08/2020 at [facility] for acute CVA and possible alcohol withdrawal and discharged to nursing facility on aspirin and Plavix presenting to the ED with concerns for melanotic stools.  At facility labs were obtained and patient noted to be severely anemic and patient transported to the ED.  On presentation patient noted to have a hemoglobin of 3.6.  Patient transfused 3 units packed red blood cells.  Hemoglobin currently at 7.1.  GI consultation pending.   Assessment & Plan:   Principal Problem:   GIB (gastrointestinal bleeding) Active Problems:   Symptomatic anemia   Iron deficiency anemia   Hypertension associated with diabetes ([place])   Type 2 diabetes mellitus without complication (HCC)   Acute CVA (cerebrovascular accident) ([place])   Hyperlipidemia associated with type 2 diabetes mellitus ([place])   Alcohol use   AKI (acute kidney injury) ([place])   Tobacco use   Constipation  1 concern for upper GI bleed/symptomatic anemia/iron deficiency anemia Questionable etiology.  Likely multifactorial.  Patient recent diagnosis of acute CVA discharged on aspirin and Plavix x3 weeks and then subsequently aspirin alone, history of alcohol use.  On presentation hemoglobin noted at 3.6.  Patient transfused 3 units packed red blood cells hemoglobin currently at 6.8.  Anemia panel consistent with iron deficiency anemia with iron level of 11, ferritin of 11, folate of 7.1.  Continue Protonix drip.  Currently n.p.o. and receiving bowel prep for bidirectional endoscopies.  Transfuse 2 units packed red blood cells.  Continue serial H&H.  Transfusion threshold hemoglobin  < 7.  GI following and patient for bidirectional endoscopies today.  Once hemoglobin has stabilized will need IV iron given during this hospitalization.  We will also likely need oral iron supplementation on discharge.  Per GI.   2.  Acute kidney injury on chronic kidney disease stage IIIa  Likely secondary to a prerenal azotemia in the setting of GI bleed.  Patient transfused 3 units packed red blood cells.  Being transfused another 2 units of packed red blood cells.  Renal function trending down with gentle hydration.  Follow.   3.  History of CVA Patient with recent history of CVA during recent hospitalization (04/23/2020-05/08/2020) and placed on DAPT 04/25/2020 which he should have completed on 05/16/2020 and to continue aspirin alone at that point.  Plavix discontinued.  EC ASA 81mg  resumed per GI recommendations.  Continue statin.  PT/OT.  SLP.  4.  Hypertension Controlled on current regimen of Coreg.  Follow.    5.  Diabetes mellitus type 2 Hemoglobin A1c 6.5 (/09/2020).  CBG 167 this morning.  Patient currently n.p.o.  Continue Lantus 5 units daily.  Sliding scale insulin.   6.  History of alcohol abuse with cognitive impairment thought secondary to alcohol abuse Patient with no signs of withdrawal.  Continue thiamine, folic acid, multivitamin.  7.  Chronic constipation Laxatives as needed.  8.  Tobacco abuse Continue nicotine patch.      DVT prophylaxis: SCDs Code Status: Full Family Communication: No family at bedside. Disposition:   Status is: Inpatient    Dispo: The patient is from: SNF              Anticipated d/c is to:  SNF              Anticipated d/c date is: 3 to 4 days              Patient currently with concerns for GI bleed, severe anemia, GI evaluation pending.  Not stable for discharge.   Difficult to place patient undetermined       Consultants:   Gastroenterology: Dr. Silverio Decamp 05/22/2020  Procedures:   Transfuse 3 units packed red blood cells  05/21/2020-05/22/2020  Transfused 2 units packed red blood cells 05/23/2020  Antimicrobials:  None   Subjective: Laying in bed receiving transfusion of packed red blood cells.  Denies any chest pain.  No shortness of breath.  No abdominal pain.  Alert to self and place only.  Some improvement with confusion.   Per RN patient with melanotic stools overnight with bowel prep.  Objective: Vitals:   05/23/20 0510 05/23/20 0525 05/23/20 0851 05/23/20 0915  BP: 102/74 102/74 (!) 148/63 (!) 128/56  Pulse: 82 82 77 75  Resp: 20 20 18    Temp: 99 F (37.2 C) 99 F (37.2 C) 97.9 F (36.6 C) 98.7 F (37.1 C)  TempSrc: Oral Oral Oral Oral  SpO2: 100%  100% 100%  Weight:  79.9 kg    Height:  5\' 11"  (1.803 m)      Intake/Output Summary (Last 24 hours) at 05/23/2020 0931 Last data filed at 05/23/2020 0300 Gross per 24 hour  Intake 704.46 ml  Output 2 ml  Net 702.46 ml   Filed Weights   05/23/20 0400 05/23/20 0525  Weight: 79.9 kg 79.9 kg    Examination:  General exam: : NAD.  Dry mucous membranes Respiratory system: Clear to auscultation bilaterally.  No wheezes, no crackles, no rhonchi.  Normal respiratory effort.  Speaking in full sentences.   Cardiovascular system: Regular rate and rhythm no murmurs rubs or gallops.  No JVD.  No lower extremity edema.  Gastrointestinal system: Abdomen soft, nontender, nondistended, positive bowel sounds.  No rebound.  No guarding. Central nervous system: Alert and oriented to self and place only. No focal neurological deficits. Extremities: Symmetric 5 x 5 power. Skin: No rashes, lesions or ulcers Psychiatry: Judgement and insight appear poor to fair. Mood & affect appropriate.  Data Reviewed: I have personally reviewed following labs and imaging studies  CBC: Recent Labs  Lab 05/21/20 1337 05/21/20 2132 05/22/20 0400 05/22/20 0832 05/22/20 1532 05/22/20 2108 05/23/20 0315  WBC 4.4  --  6.4  --   --   --   --   NEUTROABS 3.1  --   --   --    --   --   --   HGB 3.6*   < > 6.7* 7.1* 7.0* 7.0* 6.8*  HCT 12.4*   < > 20.9* 21.9* 21.4* 22.6* 21.4*  MCV 93.2  --  89.3  --   --   --   --   PLT 184  --  182  --   --   --   --    < > = values in this interval not displayed.    Basic Metabolic Panel: Recent Labs  Lab 05/21/20 1337 05/22/20 0400 05/23/20 0315  NA 139 143 145  K 4.3 3.9 3.9  CL 110 112* 115*  CO2 20* 20* 21*  GLUCOSE 364* 164* 208*  BUN 33* 31* 23  CREATININE 1.78* 1.68* 1.46*  CALCIUM 8.1* 8.2* 7.7*  MG  --   --  1.8  GFR: Estimated Creatinine Clearance: 48.7 mL/min (A) (by C-G formula based on SCr of 1.46 mg/dL (H)).  Liver Function Tests: Recent Labs  Lab 05/22/20 0400  AST 23  ALT 19  ALKPHOS 44  BILITOT 0.7  PROT 6.5  ALBUMIN 2.7*    CBG: Recent Labs  Lab 05/22/20 1136 05/22/20 1510 05/22/20 1719 05/22/20 2023 05/23/20 0727  GLUCAP 237* 210* 202* 193* 167*     Recent Results (from the past 240 hour(s))  SARS CORONAVIRUS 2 (TAT 6-24 HRS) Nasopharyngeal Nasopharyngeal Swab     Status: None   Collection Time: 05/21/20  1:43 PM   Specimen: Nasopharyngeal Swab  Result Value Ref Range Status   SARS Coronavirus 2 NEGATIVE NEGATIVE Final    Comment: (NOTE) SARS-CoV-2 target nucleic acids are NOT DETECTED.  The SARS-CoV-2 RNA is generally detectable in upper and lower respiratory specimens during the acute phase of infection. Negative results do not preclude SARS-CoV-2 infection, do not rule out co-infections with other pathogens, and should not be used as the sole basis for treatment or other patient management decisions. Negative results must be combined with clinical observations, patient history, and epidemiological information. The expected result is Negative.  Fact Sheet for Patients: SugarRoll.be  Fact Sheet for Healthcare Providers: https://www.woods-mathews.com/  This test is not yet approved or cleared by the [place] FDA  and  has been authorized for detection and/or diagnosis of SARS-CoV-2 by FDA under an Emergency Use Authorization (EUA). This EUA will remain  in effect (meaning this test can be used) for the duration of the COVID-19 declaration under Se ction 564(b)(1) of the Act, 21 U.S.C. section 360bbb-3(b)(1), unless the authorization is terminated or revoked sooner.  Performed at [facility], [place] [address], [place], [place] [address]          Radiology Studies: No results found.      Scheduled Meds: . sodium chloride   Intravenous Once  . sodium chloride   Intravenous Once  . acetaminophen  650 mg Oral Once  . aspirin EC  81 mg Oral Daily  . atorvastatin  40 mg Oral QHS  . carvedilol  25 mg Oral BID WC  . diphenhydrAMINE  25 mg Oral Once  . folic acid  1 mg Oral Daily  . furosemide  20 mg Intravenous Once  . insulin aspart  0-5 Units Subcutaneous QHS  . insulin aspart  0-9 Units Subcutaneous TID WC  . insulin glargine  5 Units Subcutaneous Daily  . multivitamin with minerals  1 tablet Oral Daily  . nicotine  14 mg Transdermal Daily  . [START ON 05/25/2020] pantoprazole  40 mg Intravenous Q12H  . thiamine  100 mg Oral Daily   Continuous Infusions: . sodium chloride 75 mL/hr at 05/23/20 0614  . pantoprozole (PROTONIX) infusion 8 mg/hr (05/23/20 0612)     LOS: 2 days    Time spent: 40 minutes    Irine Seal, MD [facility]   To contact the attending provider between 7A-7P or the covering provider during after hours 7P-7A, please log into the web site [email or web address] and access using universal  password for that web site. If you do not have the password, please call the hospital operator.  05/23/2020, 9:31 AM

## 2020-05-23 NOTE — TOC Progression Note (Signed)
Transition of Care [facility]) - Progression Note    Patient Details  Name: Gregory Nunez. MRN: [id number] Date of Birth: 1946/01/20  Transition of Care [facility]) CM/SW Contact  Shemaiah Round, Juliann Pulse, RN Phone Number: 05/23/2020, 10:05 AM  Clinical Narrative: Await PT recc prior doing fl2 for return back to [facility].Will need auth, & covid results 2hrs prior d/c.     Expected Discharge Plan: [place] Barriers to Discharge: Continued Medical Work up  Expected Discharge Plan and Services Expected Discharge Plan: [place] In-house Referral: Clinical Social Work,[facility] Discharge Planning Services: CM Consult Post Acute Care Choice: [place] Living arrangements for the past 2 months: [place] Expected Discharge Date:  (unknown)                                     Social Determinants of Health (SDOH) Interventions    Readmission Risk Interventions No flowsheet data found.

## 2020-05-23 NOTE — Interval H&P Note (Signed)
History and Physical Interval Note:  05/23/2020 1:36 PM  Gregory Nunez.  has presented today for surgery, with the diagnosis of Anemia and heme positive stools.  The various methods of treatment have been discussed with the patient and family. After consideration of risks, benefits and other options for treatment, the patient has consented to  Procedure(s): ESOPHAGOGASTRODUODENOSCOPY (EGD) WITH PROPOFOL (N/A) COLONOSCOPY WITH PROPOFOL (N/A) as a surgical intervention.  The patient's history has been reviewed, patient examined, no change in status, stable for surgery.  I have reviewed the patient's chart and labs.  Questions were answered to the patient's satisfaction.     Jackquline Denmark

## 2020-05-23 NOTE — Transfer of Care (Signed)
Immediate Anesthesia Transfer of Care Note  Patient: Gregory Nunez.  Procedure(s) Performed: ESOPHAGOGASTRODUODENOSCOPY (EGD) WITH PROPOFOL (N/A ) HOT HEMOSTASIS (ARGON PLASMA COAGULATION/BICAP) (N/A ) POLYPECTOMY COLONOSCOPY WITH PROPOFOL (N/A )  Patient Location: Endoscopy Unit  Anesthesia Type:MAC  Level of Consciousness: drowsy  Airway & Oxygen Therapy: Patient Spontanous Breathing  Post-op Assessment: Report given to RN and Post -op Vital signs reviewed and stable  Post vital signs: Reviewed and stable  Last Vitals:  Vitals Value Taken Time  BP 118/49 1443  Temp    Pulse 64 05/23/20 1442  Resp 18 05/23/20 1442  SpO2 99 % 05/23/20 1442  Vitals shown include unvalidated device data.  Last Pain:  Vitals:   05/23/20 1259  TempSrc: Oral  PainSc: 0-No pain         Complications: No complications documented.

## 2020-05-23 NOTE — Op Note (Signed)
[facility] Patient Name: Gregory Nunez Procedure Date: 05/23/2020 MRN: [id number] Attending MD: Jackquline Denmark , MD Date of Birth: December 03, 1945 CSN: [id number] Age: 75 Admit Type: Inpatient Procedure:                Colonoscopy Indications:              Heme positive stool. H/O polyps. Providers:                Jackquline Denmark, MD, Cleda Daub, RN, Elspeth Cho                            Tech., Technician Referring MD:              Medicines:                Monitored Anesthesia Care Complications:            No immediate complications. Estimated Blood Loss:     Estimated blood loss: none. Procedure:                Pre-Anesthesia Assessment:                           - Prior to the procedure, a History and Physical                            was performed, and patient medications and                            allergies were reviewed. The patient's tolerance of                            previous anesthesia was also reviewed. The risks                            and benefits of the procedure and the sedation                            options and risks were discussed with the patient.                            All questions were answered, and informed consent                            was obtained. Prior Anticoagulants: The patient has                            taken Plavix (clopidogrel), last dose was 3 days                            prior to procedure. ASA Grade Assessment: II - A                            patient with mild systemic disease. After reviewing  the risks and benefits, the patient was deemed in                            satisfactory condition to undergo the procedure.                           After obtaining informed consent, the colonoscope                            was passed under direct vision. Throughout the                            procedure, the patient's blood pressure, pulse, and                             oxygen saturations were monitored continuously. The                            [id number]-[id number] Olympus pediatric colonscope                            was introduced through the anus and advanced to the                            2 cm into the ileum. The colonoscopy was performed                            without difficulty. The patient tolerated the                            procedure well. The quality of the bowel                            preparation was adequate to identify polyps. The                            terminal ileum, ileocecal valve, appendiceal                            orifice, and rectum were photographed.                           The procedure was prolonged due to quality of                            preparation. Aggressive suctioning and aspiration                            was performed. Scope In: 2:07:16 PM Scope Out: 2:34:03 PM Scope Withdrawal Time: 0 hours 16 minutes 13 seconds  Total Procedure Duration: 0 hours 26 minutes 47 seconds  Findings:      Three sessile polyps were found in the proximal ascending colon and mid       ascending colon. The polyps were 4 to 6  mm in size. These polyps were       removed with a cold snare. Resection and retrieval were complete.      A 6 mm polyp was found in the proximal sigmoid colon. The polyp was       sessile. The polyp was removed with a cold snare. Resection and       retrieval were complete.      A 4 mm polyp was found in the distal rectum at the dentate line. The       polyp was sessile. We could not get cold snare around the polyp. The       polyp was removed with a cold biopsy forceps. Resection and retrieval       were complete.      There was a small lipoma, 10 mm in diameter, in the mid ascending colon.      Non-bleeding internal hemorrhoids were found during retroflexion. The       hemorrhoids were small.      The terminal ileum appeared normal.      The exam was otherwise without abnormality on  direct and retroflexion       views. Impression:               - Three 4 to 6 mm polyps in the proximal ascending                            colon and in the mid ascending colon, removed with                            a cold snare. Resected and retrieved.                           - One 6 mm polyp in the proximal sigmoid colon,                            removed with a cold snare. Resected and retrieved.                           - One 4 mm polyp in the distal rectum, removed with                            a cold biopsy forceps. Resected and retrieved.                           - Small lipoma in the mid ascending colon.                           - Non-bleeding internal hemorrhoids.                           - The examined portion of the ileum was normal.                           - The examination was otherwise normal on direct  and retroflexion views. Moderate Sedation:      Not Applicable - Patient had care per Anesthesia. Recommendation:           - Patient has a contact number available for                            emergencies. The signs and symptoms of potential                            delayed complications were discussed with the                            patient. Return to normal activities tomorrow.                            Written discharge instructions were provided to the                            patient.                           - Resume previous diet.                           - Continue present medications.                           - Await pathology results.                           - Resume Plavix from day after tomorrow onwards.                           - Repeat colonoscopy in 3 years for surveillance                            d/t above findings after 2-day preparation.                           - The findings and recommendations were discussed                            with the patient. Procedure Code(s):        ---  Professional ---                           [phone], Colonoscopy, flexible; with removal of                            tumor(s), polyp(s), or other lesion(s) by snare                            technique                           62831, 74, Colonoscopy, flexible; with biopsy,  single or multiple Diagnosis Code(s):        --- Professional ---                           K63.5, Polyp of colon                           K62.1, Rectal polyp                           D17.5, Benign lipomatous neoplasm of                            intra-abdominal organs                           K64.8, Other hemorrhoids                           R19.5, Other fecal abnormalities CPT copyright 2019 American Medical Association. All rights reserved. The codes documented in this report are preliminary and upon coder review may  be revised to meet current compliance requirements. Jackquline Denmark, MD 05/23/2020 3:07:05 PM This report has been signed electronically. Number of Addenda: 0

## 2020-05-23 NOTE — Op Note (Signed)
[facility] Patient Name: Gregory Nunez Procedure Date: 05/23/2020 MRN: [id number] Attending MD: Jackquline Denmark , MD Date of Birth: 03/28/1946 CSN: [id number] Age: 75 Admit Type: Inpatient Procedure:                Upper GI endoscopy Indications:              Heme positive stool, profound anemia. Previous                            history of duodenal AVMs. Providers:                Jackquline Denmark, MD, Cleda Daub, RN, Elspeth Cho                            Tech., Technician Referring MD:              Medicines:                Monitored Anesthesia Care Complications:            No immediate complications. Estimated Blood Loss:     Estimated blood loss: none. Procedure:                Pre-Anesthesia Assessment:                           - Prior to the procedure, a History and Physical                            was performed, and patient medications and                            allergies were reviewed. The patient's tolerance of                            previous anesthesia was also reviewed. The risks                            and benefits of the procedure and the sedation                            options and risks were discussed with the patient.                            All questions were answered, and informed consent                            was obtained. Prior Anticoagulants: The patient has                            taken Plavix (clopidogrel), last dose was 3 days                            prior to procedure. ASA Grade Assessment: III - A  patient with severe systemic disease. After                            reviewing the risks and benefits, the patient was                            deemed in satisfactory condition to undergo the                            procedure.                           After obtaining informed consent, the endoscope was                            passed under direct vision. Throughout the                             procedure, the patient's blood pressure, pulse, and                            oxygen saturations were monitored continuously. The                            GIF-H190 [id number] was introduced through the                            mouth, and advanced to the third part of duodenum.                            The upper GI endoscopy was accomplished without                            difficulty. The patient tolerated the procedure                            well. Scope In: Scope Out: Findings:      The examined esophagus was normal with well-defined Z-line at 40 cm,       examined by NBI.      A single 4 mm angiodysplastic lesion with no bleeding was found in the       gastric body. Fulguration to ablate the lesion by argon plasma was       successful.      Five 2 to 4 mm angiodysplastic lesions without bleeding were found in       the second portion of the duodenum and in the third portion of the       duodenum. Fulguration to ablate the lesion by argon plasma was       successful. The larger AVM in 3rd portion had some oozing requiring more       fulguration. No bleeding towards end of the procedure.      Localized minimal inflammation characterized by erythema was found in       the gastric antrum. Biopsies were taken with a cold forceps for       histology. Impression:               -  Gastroduodenal AVMs s/p APC.                           -Minimal gastritis. Moderate Sedation:      Not Applicable - Patient had care per Anesthesia. Recommendation:           - Return patient to hospital ward for ongoing care.                           - Resume previous diet.                           - Continue present medications including Protonix                            40 mg p.o. once a day.                           - Await pathology results.                           - Resume Plavix (clopidogrel) at prior dose in 2                            days.                           -  Trend CBC.                           - Avoid nonsteroidals.                           - He could have more small bowel AVMs beyond the                            reach of EGD scope. Would recommend VCE while on                            Plavix as an outpatient.                           - FU with Dr. Charolotte Capuchin clinic in 2-3 weeks. Procedure Code(s):        --- Professional ---                           [id number], Esophagogastroduodenoscopy, flexible,                            transoral; with ablation of tumor(s), polyp(s), or                            other lesion(s) (includes pre- and post-dilation                            and guide wire passage, when performed)  [id number], 59, Esophagogastroduodenoscopy, flexible,                            transoral; with biopsy, single or multiple Diagnosis Code(s):        --- Professional ---                           K31.819, Angiodysplasia of stomach and duodenum                            without bleeding                           K29.70, Gastritis, unspecified, without bleeding                           R19.5, Other fecal abnormalities CPT copyright 2019 American Medical Association. All rights reserved. The codes documented in this report are preliminary and upon coder review may  be revised to meet current compliance requirements. Jackquline Denmark, MD 05/23/2020 2:58:37 PM This report has been signed electronically. Number of Addenda: 0

## 2020-05-23 NOTE — Interval H&P Note (Signed)
History and Physical Interval Note:  05/23/2020 1:38 PM  Gregory Nunez.  has presented today for surgery, with the diagnosis of Anemia and heme positive stools.  The various methods of treatment have been discussed with the patient and family. After consideration of risks, benefits and other options for treatment, the patient has consented to  Procedure(s): ESOPHAGOGASTRODUODENOSCOPY (EGD) WITH PROPOFOL (N/A) COLONOSCOPY WITH PROPOFOL (N/A) as a surgical intervention.  The patient's history has been reviewed, patient examined, no change in status, stable for surgery.  I have reviewed the patient's chart and labs.  Questions were answered to the patient's satisfaction.     Jackquline Denmark

## 2020-05-23 NOTE — Progress Notes (Signed)
PT Cancellation Note  Patient Details Name: Gregory Nunez. MRN: [id number] DOB: [birth year]   Cancelled Treatment:    Reason Eval/Treat Not Completed: Medical issues which prohibited therapy Pt receiving 2 units PRBCs today and colonscopy pending for today as well.  Will check back as schedule permits.   Teleshia Lemere,KATHrine E 05/23/2020, 11:56 AM Jannette Spanner PT, DPT Acute Rehabilitation Services Pager: [phone] Office: [phone]

## 2020-05-23 NOTE — Anesthesia Preprocedure Evaluation (Addendum)
Anesthesia Evaluation  Patient identified by MRN, date of birth, ID band Patient confused    Reviewed: Allergy & Precautions, NPO status , Patient's Chart, lab work & pertinent test results  Airway Mallampati: I  TM Distance: >3 FB Neck ROM: Full    Dental no notable dental hx. (+) Edentulous Upper, Edentulous Lower, Dental Advisory Given   Pulmonary Current Smoker,    Pulmonary exam normal breath sounds clear to auscultation       Cardiovascular hypertension, Pt. on medications Normal cardiovascular exam Rhythm:Regular Rate:Normal     Neuro/Psych Depression Pt confused since CVA CVA, Residual Symptoms    GI/Hepatic Neg liver ROS, GERD  ,  Endo/Other  diabetes  Renal/GU Lab Results      Component                Value               Date                      CREATININE               1.46 (H)            05/23/2020                BUN                      23                  05/23/2020                NA                       145                 05/23/2020                K                        3.9                 05/23/2020                CL                       115 (H)             05/23/2020                CO2                      21 (L)              05/23/2020                Musculoskeletal negative musculoskeletal ROS (+)   Abdominal   Peds  Hematology  (+) anemia , Hgb 6.9   Anesthesia Other Findings   Reproductive/Obstetrics                           Anesthesia Physical Anesthesia Plan  ASA: III and emergent  Anesthesia Plan: MAC   Post-op Pain Management:    Induction: Intravenous  PONV Risk Score and Plan: Treatment may vary due to age or medical condition  Airway Management Planned: Nasal Cannula and Natural Airway  Additional Equipment: None  Intra-op Plan:   Post-operative Plan:   Informed Consent: I have reviewed the patients History and Physical, chart, labs and  discussed the procedure including the risks, benefits and alternatives for the proposed anesthesia with the patient or authorized representative who has indicated his/her understanding and acceptance.     Dental advisory given  Plan Discussed with:   Anesthesia Plan Comments: (EGD + Colon for GI bleed)       Anesthesia Quick Evaluation

## 2020-05-24 DIAGNOSIS — N179 Acute kidney failure, unspecified: Secondary | ICD-10-CM | POA: Diagnosis not present

## 2020-05-24 DIAGNOSIS — I639 Cerebral infarction, unspecified: Secondary | ICD-10-CM | POA: Diagnosis not present

## 2020-05-24 DIAGNOSIS — K922 Gastrointestinal hemorrhage, unspecified: Secondary | ICD-10-CM | POA: Diagnosis not present

## 2020-05-24 DIAGNOSIS — Z7289 Other problems related to lifestyle: Secondary | ICD-10-CM | POA: Diagnosis not present

## 2020-05-24 LAB — RENAL FUNCTION PANEL
Albumin: 2.6 g/dL — ABNORMAL LOW (ref 3.5–5.0)
Anion gap: 8 (ref 5–15)
BUN: 17 mg/dL (ref 8–23)
CO2: 22 mmol/L (ref 22–32)
Calcium: 8.2 mg/dL — ABNORMAL LOW (ref 8.9–10.3)
Chloride: 113 mmol/L — ABNORMAL HIGH (ref 98–111)
Creatinine, Ser: 1.31 mg/dL — ABNORMAL HIGH (ref 0.61–1.24)
GFR, Estimated: 58 mL/min — ABNORMAL LOW (ref >60)
Glucose, Bld: 112 mg/dL — ABNORMAL HIGH (ref 70–99)
Phosphorus: 2.7 mg/dL (ref 2.5–4.6)
Potassium: 3.6 mmol/L (ref 3.5–5.1)
Sodium: 143 mmol/L (ref 135–145)

## 2020-05-24 LAB — CBC WITH DIFFERENTIAL/PLATELET
Abs Immature Granulocytes: 0.01 10*3/uL (ref 0.00–0.07)
Basophils Absolute: 0 10*3/uL (ref 0.0–0.1)
Basophils Relative: 0 %
Eosinophils Absolute: 0.1 10*3/uL (ref 0.0–0.5)
Eosinophils Relative: 2 %
HCT: 30.4 % — ABNORMAL LOW (ref 39.0–52.0)
Hemoglobin: 9.7 g/dL — ABNORMAL LOW (ref 13.0–17.0)
Immature Granulocytes: 0 %
Lymphocytes Relative: 14 %
Lymphs Abs: 1 10*3/uL (ref 0.7–4.0)
MCH: 28.1 pg (ref 26.0–34.0)
MCHC: 31.9 g/dL (ref 30.0–36.0)
MCV: 88.1 fL (ref 80.0–100.0)
Monocytes Absolute: 0.6 10*3/uL (ref 0.1–1.0)
Monocytes Relative: 9 %
Neutro Abs: 5.1 10*3/uL (ref 1.7–7.7)
Neutrophils Relative %: 75 %
Platelets: 141 10*3/uL — ABNORMAL LOW (ref 150–400)
RBC: 3.45 MIL/uL — ABNORMAL LOW (ref 4.22–5.81)
RDW: 16.4 % — ABNORMAL HIGH (ref 11.5–15.5)
WBC: 6.8 10*3/uL (ref 4.0–10.5)
nRBC: 0 % (ref 0.0–0.2)

## 2020-05-24 LAB — HEMOGLOBIN AND HEMATOCRIT, BLOOD
HCT: 30.3 % — ABNORMAL LOW (ref 39.0–52.0)
Hemoglobin: 9.7 g/dL — ABNORMAL LOW (ref 13.0–17.0)

## 2020-05-24 LAB — GLUCOSE, CAPILLARY
Glucose-Capillary: 93 mg/dL (ref 70–99)
Glucose-Capillary: 98 mg/dL (ref 70–99)
Glucose-Capillary: 154 mg/dL — ABNORMAL HIGH (ref 70–99)
Glucose-Capillary: 152 mg/dL — ABNORMAL HIGH (ref 70–99)
Glucose-Capillary: 200 mg/dL — ABNORMAL HIGH (ref 70–99)

## 2020-05-24 LAB — MAGNESIUM: Magnesium: 1.5 mg/dL — ABNORMAL LOW (ref 1.7–2.4)

## 2020-05-24 MED ORDER — POTASSIUM CHLORIDE CRYS ER 20 MEQ PO TBCR
40.0000 meq | EXTENDED_RELEASE_TABLET | Freq: Once | ORAL | Status: AC
  Administered 2020-05-24: 40 meq via ORAL
  Filled 2020-05-24: qty 2

## 2020-05-24 MED ORDER — AMLODIPINE BESYLATE 5 MG PO TABS
5.0000 mg | ORAL_TABLET | Freq: Every day | ORAL | Status: DC
  Administered 2020-05-24 – 2020-05-26 (×3): 5 mg via ORAL
  Filled 2020-05-24 (×3): qty 1

## 2020-05-24 MED ORDER — MAGNESIUM SULFATE 4 GM/100ML IV SOLN
4.0000 g | Freq: Once | INTRAVENOUS | Status: AC
  Administered 2020-05-24: 4 g via INTRAVENOUS
  Filled 2020-05-24: qty 100

## 2020-05-24 NOTE — Evaluation (Signed)
Speech Language Pathology Evaluation Patient Details Name: Gregory Nunez. MRN: [id number] DOB: Mar 02, 1946 Today's Date: 05/24/2020 Time: 1324-4010 SLP Time Calculation (min) (ACUTE ONLY): 40 min  Problem List:  Patient Active Problem List   Diagnosis Date Noted  . Iron deficiency anemia 05/22/2020  . Constipation 05/22/2020  . GIB (gastrointestinal bleeding) 05/21/2020  . Tobacco abuse   . 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 ([place])   . Acute CVA (cerebrovascular accident) ([place]) 04/23/2020  . Hyperlipidemia associated with type 2 diabetes mellitus ([place]) 04/23/2020  . Alcohol use 04/23/2020  . AKI (acute kidney injury) ([place]) 04/23/2020  . Tobacco use 04/23/2020  . Unresponsive 11/08/2018  . Syncope, convulsive 11/08/2018  . Hypertension associated with diabetes ([place]) 11/08/2018  . Alcohol withdrawal seizure with complication, with unspecified complication ([place]) 27/25/3664  . Type 2 diabetes mellitus without complication ([place]) 40/34/7425  . Symptomatic anemia 04/15/2018  . Prostate cancer ([place]) 02/27/2014  . Malignant neoplasm of prostate ([place]) 01/09/2014  . Bunion 01/17/2013  . 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 Cancer [facility])    prostate  . Cataract    OD  . Depression   . Diabetes mellitus without complication ([place])   . Diabetic retinopathy ([place])    NPDR OU  . GERD (gastroesophageal reflux disease)    if drinks alcohol  . 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 Hyperlipidemia   . Hypertension   . Hypertensive retinopathy    OU  . Malignant neoplasm of prostate ([place]) 01/09/2014  . Pancreatitis   . Prostate cancer ([place]) 12/19/13   Gleason 4+3=7, volume 31.31 cc  . Shortness of breath dyspnea    with exertion    Past Surgical History:  Past Surgical History:  Procedure Laterality Date  .  BIOPSY  04/16/2018   Procedure: BIOPSY;  Surgeon: Juanita Craver, MD;  Location: [facility] ENDOSCOPY;  Service: Endoscopy;;  . biopsy on throat     hx of   . CATARACT EXTRACTION Bilateral    Dr. Quentin Ore  . ESOPHAGOGASTRODUODENOSCOPY Left 04/16/2018   Procedure: ESOPHAGOGASTRODUODENOSCOPY (EGD);  Surgeon: Juanita Craver, MD;  Location: [facility];  Service: Endoscopy;  Laterality: Left;  . EYE SURGERY    . FOOT SURGERY    . HOT HEMOSTASIS N/A 04/16/2018   Procedure: HOT HEMOSTASIS (ARGON PLASMA COAGULATION/BICAP);  Surgeon: Juanita Craver, MD;  Location: [facility];  Service: Endoscopy;  Laterality: N/A;  . LYMPHADENECTOMY Bilateral 02/27/2014   Procedure: BILATERAL LYMPHADENECTOMY;  Surgeon: Alexis Frock, MD;  Location: [facility] ORS;  Service: Urology;  Laterality: Bilateral;  . PROSTATE BIOPSY  12/2013   Gleason 4+3=7, volume 31.31 cc  . ROBOT ASSISTED LAPAROSCOPIC RADICAL PROSTATECTOMY N/A 02/27/2014   Procedure: ROBOTIC ASSISTED LAPAROSCOPIC RADICAL PROSTATECTOMY WITH INDOCYANINE GREEN DYE;  Surgeon: Alexis Frock, MD;  Location: [facility] ORS;  Service: Urology;  Laterality: N/A;   HPI:  75 y.o. male with medical history significant for type 2 diabetes with diabetic retinopathy, hypertension, hyperlipidemia, prostate cancer, and alcohol use who presents to the ED for evaluation of right-sided weakness with recent cva.  Pt found to have symptomatic anemia.  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 Additional 5 mm subacute ischemic nonhemorrhagic right periatrial  white matter infarct,   Speech eval and swallow evaluation.  Assessment / Plan / Recommendation Clinical Impression  The [place] Mental Status Examination (SLUMS) was administered with a score obtained of 6/30 (With a normal score of 27/30 being typical for this assessment) indicating deficits in the areas of  attention, memory, auditory comprehension problem solving and organization.  During prior admit, the patient scored 11/30 on this same test.  When expressing himself, he continues with some perseveration with difficulty recalling new nouns stated vs prior in testing.  He continued with difficulty naming items within categories - naming 3 animals in 60 seconds- which is likely due to memory components/attention during sustained attention linguistic tasks.  Pt's novel expressive language was delayed at times with word finding deficits apparent.  He demonstrates excellent effort however and therefore is an excellent rehab candidate.  OME revealed right facial asymmetry - all other CNs were unaffected- suspecting resolution from Jan 2022 CVA. Speech was minimally dysarthric within simple conversation with speech being judged as 90% intelligible due to imprecise articulation with consonant clusters.  Pt would benefit from ST while at next venue of care for cognitive linguistic treatment to maximize his rehabilitation given prior to his January 2022 CVA he reports he lived alone and handled all of his life duties independently (including medications, ADLs, appointments, bills, etc).  Exam revealed that pt's strong suit is his reading ability - thus recommend to maximize on this to facilitate expressive language as well as cognitive skills.  Pt educated to findings of testing and reported gratitude for testing and information.  Thank you for this consult.    SLP Assessment  SLP Recommendation/Assessment: Patient needs continued Speech Lanaguage Pathology Services SLP Visit Diagnosis: Cognitive communication deficit (R41.841) Attention and concentration deficit following: Cerebral infarction    Follow Up Recommendations  Skilled Nursing facility    Frequency and Duration min 1 x/week  1 week      SLP Evaluation Cognition  Overall Cognitive Status: Impaired/Different from baseline Arousal/Alertness:  Awake/alert Orientation Level: Oriented to person;Oriented to place (to place with written MC, to month with context cue) Attention: Sustained Sustained Attention Impairment: Verbal basic;Functional basic Memory: Impaired (recalled 1/5 words I, 4/5 with various cues *category and multiple choice) Memory Impairment: Decreased recall of new information;Retrieval deficit;Decreased short term memory Decreased Short Term Memory: Verbal basic;Functional basic Problem Solving: Impaired Problem Solving Impairment: Verbal basic;Functional basic Behaviors: Perseveration Safety/Judgment: Impaired Comments: pt verbalizes that if he tries to get up on his own, that the staff runs to check on him, probing further allowed him to verbalize reasoning       Comprehension  Auditory Comprehension Overall Auditory Comprehension: Impaired Commands: Impaired One Step Basic Commands: 75-100% accurate Two Step Basic Commands: 75-100% accurate Multistep Basic Commands: 25-49% accurate Complex Commands: Not tested Conversation: Simple Interfering Components: Processing speed;Working Marine scientist;Anxiety (pt noted to fidget with his hands and admitted he was nervous when directly asked) EffectiveTechniques: Extra processing time;Repetition;Visual/Gestural cues (written cues) Visual Recognition/Discrimination Discrimination: Not tested Reading Comprehension Reading Status:  (pt read swallow sign and verbalized reasoning for precautions)    Expression Expression Primary Mode of Expression: Nonverbal - gestures Verbal Expression Overall Verbal Expression: Impaired Initiation: No impairment Level of Generative/Spontaneous Verbalization: Conversation Repetition: No impairment Level of Impairment: Phrase level Naming: Impairment Responsive: 51-75% accurate Confrontation: Impaired Convergent: 25-49% accurate Divergent: 25-49% accurate Verbal Errors: Perseveration Pragmatics: No impairment Interfering Components:  Attention Effective Techniques: Written cues Non-Verbal Means of Communication: Not applicable Written Expression Dominant Hand: Right Written Expression:  (impaired expression)   Oral /  Motor  Oral Motor/Sensory Function Overall Oral Motor/Sensory Function: Mild impairment Facial ROM: Reduced right Facial Symmetry: Abnormal symmetry right Facial Strength: Within Functional Limits Facial Sensation: Within Functional Limits Lingual ROM: Within Functional Limits Lingual Symmetry: Within Functional Limits Lingual Strength: Reduced Lingual Sensation: Within Functional Limits Velum: Within Functional Limits Mandible: Within Functional Limits Motor Speech Overall Motor Speech: Appears within functional limits for tasks assessed Respiration: Within functional limits Phonation: Low vocal intensity Resonance: Within functional limits Articulation: Impaired Level of Impairment: Sentence Intelligibility: Intelligibility reduced (minimally reduced for consonant clusters) Word: 75-100% accurate Phrase: 75-100% accurate Sentence: 75-100% accurate Conversation: 75-100% accurate Motor Planning: Witnin functional limits Motor Speech Errors: Not applicable   GO                    Gregory Nunez 05/24/2020, 8:11 PM  Kathleen Lime, MS [place] Office [phone] Pager [phone]

## 2020-05-24 NOTE — Progress Notes (Addendum)
PROGRESS NOTE    Gregory Nunez Gregory Nunez.  WCH:[id number] DOB: [birth year] DOA: 05/21/2020 PCP: Gregory Ebbs, MD   Chief Complaint  Patient presents with  . Anemia    Brief Narrative:  Patient is a 75 year old African-American gentleman, history of prostate cancer, diabetes, hyperlipidemia, hypertension, use disorder recently hospitalized 04/23/2020>>> 05/08/2020 at [facility] for acute CVA and possible alcohol withdrawal and discharged to nursing facility on aspirin and Plavix presenting to the ED with concerns for melanotic stools.  At facility labs were obtained and patient noted to be severely anemic and patient transported to the ED.  On presentation patient noted to have a hemoglobin of 3.6.  Patient transfused 3 units packed red blood cells.  Hemoglobin currently at 7.1.  GI consultation pending.   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  Patient recent diagnosis of acute CVA discharged on aspirin and Plavix x3 weeks and then subsequently aspirin alone, history of alcohol use.  On presentation hemoglobin noted at 3.6.  Patient transfused a total of 5 units packed red blood cells during this hospitalization hemoglobin currently at 9.7 from 3.6 on admission.  -Anemia panel consistent with iron deficiency anemia with iron level of 11, ferritin of 11, folate of 7.1.   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   Continue Protonix drip through today and transition to IV PPI tomorrow and subsequently to oral Protonix daily.   -Repeat H&H this afternoon.  Transfusion threshold hemoglobin < 7.  -If H&H is stable this afternoon will order IV iron.   -May need oral iron supplementation on discharge.   -We will need outpatient follow-up with GI.   2.  Acute kidney injury on chronic kidney disease stage IIIa  Likely secondary to a prerenal azotemia in the setting of GI bleed.  Patient transfused 5 units packed red blood cells during this hospitalization.  Patient on IV fluids.  Renal function improved and creatinine down to 1.31.  Saline lock IV fluids.  Follow.  3.  History of CVA Patient with recent history of CVA during recent hospitalization (04/23/2020-05/08/2020) and placed on DAPT 04/25/2020 which he should have completed on 05/16/2020 and to continue aspirin alone at that point.  Plavix discontinued.  EC ASA 81mg  resumed per GI recommendations.  Continue statin.  PT/OT.  SLP.  4.  Hypertension Systolic blood pressure in the 150s.  Continue Coreg.  Start half home dose Norvasc at 5 mg daily and uptitrate to 10 mg as needed for further blood pressure control.  Saline lock IV fluids.  Follow for now.  5.  Diabetes mellitus type 2 Hemoglobin A1c 6.5 (/09/2020).  CBG 93 this morning.  Diet has been advanced currently on a heart healthy diet.  Continue Lantus 5 units daily.  Sliding scale insulin.  6.  History of alcohol abuse with cognitive impairment thought secondary to alcohol abuse Patient with no signs of withdrawal.  Continue thiamine, folic acid, multivitamin.  7.  Chronic constipation Laxatives as needed.  8.  Tobacco abuse Nicotine patch.  9.  Hypomagnesemia Magnesium sulfate 4 g IV  x1.  Repeat labs in the morning.    DVT prophylaxis: SCDs Code Status: Full Family Communication: No family at bedside. Disposition:   Status is: Inpatient    Dispo: The patient is from: SNF              Anticipated d/c is to: SNF              Anticipated d/c date is: 05/26/2020              Patient currently with concerns for GI bleed, severe anemia, transfuse packed red blood cells, seen by GI and underwent endoscopy and colonoscopy, on Protonix drip.  Not stable for discharge.    Difficult to place patient undetermined       Consultants:   Gastroenterology: Dr. Silverio Decamp 05/22/2020  Procedures:   Transfuse 3 units packed red blood cells 05/21/2020-05/22/2020  Transfused 2 units packed red blood cells 05/23/2020  Upper endoscopy/colonoscopy 05/23/2020 per Dr. Lyndel Safe  Antimicrobials:  None   Subjective: Sleeping but arousable.  Feeling better.  Denies any significant weakness.  No chest pain.  No shortness of breath.  Denies any melanotic stools or bright red blood per rectum.    Objective: Vitals:   05/23/20 2227 05/23/20 2356 05/24/20 0439 05/24/20 0752  BP: 122/63 140/73 (!) 154/77 (!) 151/78  Pulse: 78 75 80 73  Resp: 20 20 20 18   Temp: 98.5 F (36.9 C) 99.4 F (37.4 C) 99.6 F (37.6 C) 99.8 F (37.7 C)  TempSrc: Oral Oral Oral Oral  SpO2: 99% 99% 97% 100%  Weight:      Height:        Intake/Output Summary (Last 24 hours) at 05/24/2020 0914 Last data filed at 05/24/2020 0541 Gross per 24 hour  Intake 3956.82 ml  Output 1350 ml  Net 2606.82 ml   Filed Weights   05/23/20 0400 05/23/20 0525  Weight: 79.9 kg 79.9 kg    Examination:  General exam: : NAD Respiratory system: CTA B anterior lung fields.  No wheezes, no rhonchi.  Speaking in full sentences.  Normal respiratory effort. Cardiovascular system: Regular rate and rhythm no murmurs rubs or gallops.  No JVD.  No lower extremity edema.  Gastrointestinal system: Abdomen soft, nontender, nondistended,  positive bowel sounds.  No rebound.  No guarding. Central nervous system: Alert and oriented to self and place.  Somewhat of an expressive aphasia.  Moving extremities spontaneously. Extremities: Symmetric 5 x 5 power. Skin: No rashes, lesions or ulcers Psychiatry: Judgement and insight appear fair to poor. Mood & affect appropriate.   Data Reviewed: I have personally reviewed following labs and imaging studies  CBC: Recent Labs  Lab 05/21/20 1337 05/21/20 2132 05/22/20 0400 05/22/20 0832 05/22/20 1532 05/22/20 2108 05/23/20 0315 05/23/20 1632 05/24/20 0554  WBC 4.4  --  6.4  --   --   --   --   --  6.8  NEUTROABS 3.1  --   --   --   --   --   --   --  5.1  HGB 3.6*   < > 6.7*   < > 7.0* 7.0* 6.8* 9.1* 9.7*  HCT 12.4*   < > 20.9*   < >  21.4* 22.6* 21.4* 28.7* 30.4*  MCV 93.2  --  89.3  --   --   --   --   --  88.1  PLT 184  --  182  --   --   --   --   --  141*   < > = values in this interval not displayed.    Basic Metabolic Panel: Recent Labs  Lab 05/21/20 1337 05/22/20 0400 05/23/20 0315 05/24/20 0554  NA 139 143 145 143  K 4.3 3.9 3.9 3.6  CL 110 112* 115* 113*  CO2 20* 20* 21* 22  GLUCOSE 364* 164* 208* 112*  BUN 33* 31* 23 17  CREATININE 1.78* 1.68* 1.46* 1.31*  CALCIUM 8.1* 8.2* 7.7* 8.2*  MG  --   --  1.8 1.5*  PHOS  --   --   --  2.7    GFR: Estimated Creatinine Clearance: 54.3 mL/min (A) (by C-G formula based on SCr of 1.31 mg/dL (H)).  Liver Function Tests: Recent Labs  Lab 05/22/20 0400 05/24/20 0554  AST 23  --   ALT 19  --   ALKPHOS 44  --   BILITOT 0.7  --   PROT 6.5  --   ALBUMIN 2.7* 2.6*    CBG: Recent Labs  Lab 05/23/20 1557 05/23/20 2029 05/23/20 2352 05/24/20 0436 05/24/20 0749  GLUCAP 89 146* 101* 93 98     Recent Results (from the past 240 hour(s))  SARS CORONAVIRUS 2 (TAT 6-24 HRS) Nasopharyngeal Nasopharyngeal Swab     Status: None   Collection Time: 05/21/20  1:43 PM   Specimen: Nasopharyngeal Swab  Result  Value Ref Range Status   SARS Coronavirus 2 NEGATIVE NEGATIVE Final    Comment: (NOTE) SARS-CoV-2 target nucleic acids are NOT DETECTED.  The SARS-CoV-2 RNA is generally detectable in upper and lower respiratory specimens during the acute phase of infection. Negative results do not preclude SARS-CoV-2 infection, do not rule out co-infections with other pathogens, and should not be used as the sole basis for treatment or other patient management decisions. Negative results must be combined with clinical observations, patient history, and epidemiological information. The expected result is Negative.  Fact Sheet for Patients: SugarRoll.be  Fact Sheet for Healthcare Providers: https://www.woods-mathews.com/  This test is not yet approved or cleared by the [place] FDA and  has been authorized for detection and/or diagnosis of SARS-CoV-2 by FDA under an Emergency Use Authorization (EUA). This EUA will remain  in effect (meaning this test can be used) for the duration of the COVID-19 declaration under Se ction 564(b)(1) of the Act, 21 U.S.C. section 360bbb-3(b)(1), unless the authorization is terminated or revoked sooner.  Performed at [facility], [place] [address] [place], [place] [address]          Radiology Studies: No results found.      Scheduled Meds: . sodium chloride   Intravenous Once  . amLODipine  5 mg Oral Daily  . aspirin EC  81 mg Oral Daily  . atorvastatin  40 mg Oral QHS  . carvedilol  25 mg Oral BID WC  . folic acid  1 mg Oral Daily  . insulin aspart  0-9 Units Subcutaneous Q4H  . insulin glargine  5 Units Subcutaneous Daily  . multivitamin with minerals  1 tablet Oral Daily  . nicotine  14 mg Transdermal Daily  . [START ON 05/25/2020] pantoprazole  40 mg Intravenous Q12H  . potassium chloride  40 mEq  Oral Once  . thiamine  100 mg Oral Daily   Continuous Infusions: . magnesium sulfate bolus IVPB     . pantoprozole (PROTONIX) infusion 8 mg/hr (05/23/20 2229)     LOS: 3 days    Time spent: 35 minutes    Irine Seal, MD [facility]   To contact the attending provider between 7A-7P or the covering provider during after hours 7P-7A, please log into the web site [email or web address] and access using universal [place] password for that web site. If you do not have the password, please call the hospital operator.  05/24/2020, 9:14 AM

## 2020-05-24 NOTE — Evaluation (Signed)
Clinical/Bedside Swallow Evaluation Patient Details  Name: Toan Mort. MRN: [id number] Date of Birth: [birth year]  Today's Date: 05/24/2020 Time: SLP Start Time (ACUTE ONLY): 1916 SLP Stop Time (ACUTE ONLY): 1935 SLP Time Calculation (min) (ACUTE ONLY): 19 min  Past Medical History:  Past Medical History:  Diagnosis Date  . Cancer [facility])    prostate  . Cataract    OD  . Depression   . Diabetes mellitus without complication ([place])   . Diabetic retinopathy ([place])    NPDR OU  . GERD (gastroesophageal reflux disease)    if drinks alcohol  . 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 Hyperlipidemia   . Hypertension   . Hypertensive retinopathy    OU  . Malignant neoplasm of prostate ([place]) 01/09/2014  . Pancreatitis   . Prostate cancer ([place]) 12/19/13   Gleason 4+3=7, volume 31.31 cc  . Shortness of breath dyspnea    with exertion    Past Surgical History:  Past Surgical History:  Procedure Laterality Date  . BIOPSY  04/16/2018   Procedure: BIOPSY;  Surgeon: Juanita Craver, MD;  Location: [facility] ENDOSCOPY;  Service: Endoscopy;;  . biopsy on throat     hx of   . CATARACT EXTRACTION Bilateral    Dr. Quentin Ore  . ESOPHAGOGASTRODUODENOSCOPY Left 04/16/2018   Procedure: ESOPHAGOGASTRODUODENOSCOPY (EGD);  Surgeon: Juanita Craver, MD;  Location: [facility];  Service: Endoscopy;  Laterality: Left;  . EYE SURGERY    . FOOT SURGERY    . HOT HEMOSTASIS N/A 04/16/2018   Procedure: HOT HEMOSTASIS (ARGON PLASMA COAGULATION/BICAP);  Surgeon: Juanita Craver, MD;  Location: [facility];  Service: Endoscopy;  Laterality: N/A;  . LYMPHADENECTOMY Bilateral 02/27/2014   Procedure: BILATERAL LYMPHADENECTOMY;  Surgeon: Alexis Frock, MD;  Location: [facility] ORS;  Service: Urology;  Laterality: Bilateral;  . PROSTATE BIOPSY  12/2013   Gleason 4+3=7, volume 31.31 cc  . ROBOT ASSISTED LAPAROSCOPIC RADICAL PROSTATECTOMY N/A  02/27/2014   Procedure: ROBOTIC ASSISTED LAPAROSCOPIC RADICAL PROSTATECTOMY WITH INDOCYANINE GREEN DYE;  Surgeon: Alexis Frock, MD;  Location: [facility] OR[facility];  Service: Urology;  Laterality: N/A;   HPI:  75 y.o. male with medical history significant for type 2 diabetes with diabetic retinopathy, hypertension, hyperlipidemia, prostate cancer, and alcohol use who presents to the ED for evaluation of right-sided weakness with recent cva.  Pt found to have symptomatic anemia.  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 Additional 5 mm subacute ischemic nonhemorrhagic right periatrial  white matter infarct,   Speech eval and swallow evaluation.   Assessment / Plan / Recommendation Clinical Impression  Functional oropharyngeal swallow observed today with pt's intake including 3 ounce water test, 4 ounces applesauce, entire packet of graham crackers.  No indication of aspiration observed nor indication of pharyngeal deficits.  Pt does consume intake at a very rapid rate and benefited from cues to slow.  Mildly prolonged "mastication" with solids noted but pt is used to wearing dentures *which were present in January 2022 at [facility] - ? if they are at his facility.  He was fully alert and did not demonstrate decreased labial seal or oral retention as seen during admit from January 2022 CVA.  Mild right facial asymmetry noted but lingual musculature, velum elevation and mandibular opening WFL.  Recommend softer foods given pt does not have his dentures.  Educated pt  to recommendations using teach back and provided written compensation strategies.  No follow up re: swallowing ability but intermittent supervision advised to help pt recall SLOW rate.  Thanks for this consult. SLP Visit Diagnosis: Dysphagia, oral phase (R13.11) Attention and concentration deficit following: Cerebral infarction;Other  cerebrovascular disease (GIB currently, CVA 04/2020)    Aspiration Risk  Mild aspiration risk    Diet Recommendation Dysphagia 3 (Mech soft);Regular;Thin liquid  Encouraged pt to order soft foods  Medication Administration: Other (Comment) (as tolerated) Supervision: Patient able to self feed;Intermittent supervision to cue for compensatory strategies Compensations: Slow rate;Small sips/bites;Minimize environmental distractions    Other  Recommendations Oral Care Recommendations: Oral care BID   Follow up Recommendations Skilled Nursing facility      Frequency and Duration min 1 x/week  1 week       Prognosis   n/a     Swallow Study   General Date of Onset: 05/24/20 HPI: 75 y.o. male with medical history significant for type 2 diabetes with diabetic retinopathy, hypertension, hyperlipidemia, prostate cancer, and alcohol use who presents to the ED for evaluation of right-sided weakness with recent cva.  Pt found to have symptomatic anemia.  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 Additional 5 mm subacute ischemic nonhemorrhagic right periatrial  white matter infarct,   Speech eval and swallow evaluation. Type of Study: Bedside Swallow Evaluation Previous Swallow Assessment: [facility] swallow screen 04/24/19 passed Diet Prior to this Study: Regular;Thin liquids Temperature Spikes Noted: No Respiratory Status: Room air History of Recent Intubation: No Behavior/Cognition: Alert;Cooperative Oral Care Completed by SLP: No Oral Cavity - Dentition: Other (Comment) (pt does not have dentures in the room - note he had them during hospital admit 04/2020) Vision: Functional for self-feeding Self-Feeding Abilities: Able to feed self Patient Positioning: Upright in bed Baseline Vocal Quality: Low vocal intensity (minimally low) Volitional Cough: Strong Volitional Swallow: Able to  elicit    Oral/Motor/Sensory Function Overall Oral Motor/Sensory Function: Mild impairment Facial ROM: Reduced right Facial Symmetry: Abnormal symmetry right Facial Strength: Within Functional Limits Facial Sensation: Within Functional Limits Lingual ROM: Within Functional Limits Lingual Symmetry: Within Functional Limits Lingual Strength: Reduced Lingual Sensation: Within Functional Limits Velum: Within Functional Limits Mandible: Within Functional Limits   Ice Chips Ice chips: Not tested   Thin Liquid Thin Liquid: Within functional limits Presentation: Straw;Self Fed    Nectar Thick Nectar Thick Liquid: Not tested   Honey Thick Honey Thick Liquid: Not tested   Puree Puree: Within functional limits Presentation: Self Fed   Solid     Solid: Within functional limits Presentation: Self Fed Other Comments: prolonged mastication but pt cleared independently - pt has dentures and is used to wearing them to eat - thus recommend softer foods      Macario Golds 05/24/2020,8:21 PM  Kathleen Lime, MS [place] Office [phone] Pager [phone]

## 2020-05-24 NOTE — Evaluation (Signed)
Physical Therapy Evaluation Patient Details Name: Gregory Nunez. MRN: [id number] DOB: [birth year] Today's Date: 05/24/2020   History of Present Illness  Patient is a 75 year old African-American gentleman, history of prostate cancer, diabetes, hyperlipidemia, hypertension, use disorder recently hospitalized 04/23/2020>>> 05/08/2020 at [facility] for acute CVA and possible alcohol withdrawal and discharged to nursing facility on aspirin and Plavix presenting to the ED with concerns for melanotic stools.  At facility labs were obtained and patient noted to be severely anemic and patient transported to the ED.  On presentation patient noted to have a hemoglobin of 3.6.  Patient transfused 3 units packed red blood cells.  Clinical Impression  Pt admitted with above diagnosis.  Pt very pleasant and cooperative, oriented to self only. Presents with significant balance deficits and is at risk for falls. Recommend SNF post acute. Will follow in acute setting.  Pt currently with functional limitations due to the deficits listed below (see PT Problem List). Pt will benefit from skilled PT to increase their independence and safety with mobility to allow discharge to the venue listed below.       Follow Up Recommendations SNF    Equipment Recommendations  None recommended by PT    Recommendations for Other Services       Precautions / Restrictions Precautions Precautions: Fall Precaution Comments: near fall with PT on eval Restrictions Weight Bearing Restrictions: No      Mobility  Bed Mobility               General bed mobility comments: pt up in recliner upon arrival    Transfers Overall transfer level: Needs assistance Equipment used: Rolling walker (2 wheeled) Transfers: Sit to/from Stand Sit to Stand: Min assist;Min guard Stand pivot transfers: Min assist       General transfer comment: cues for hand placement and sequencing, light assist to rise and  steady  Ambulation/Gait Ambulation/Gait assistance: Min assist;Mod assist Gait Distance (Feet): 100 Feet Assistive device: Rolling walker (2 wheeled) Gait Pattern/deviations: Step-through pattern;Drifts right/left;Narrow base of support     General Gait Details: cues for RW position, 2 breaks to correct R hand position on RW. drifts R and L. assist to steady. LOB x3 with overt LOB with L turn on entering room requiring mod assist to recover  Stairs            Wheelchair Mobility    Modified Rankin (Stroke Patients Only)       Balance Overall balance assessment: Needs assistance Sitting-balance support: Feet supported;No upper extremity supported Sitting balance-Leahy Scale: Fair     Standing balance support: Bilateral upper extremity supported;During functional activity Standing balance-Leahy Scale: Fair Standing balance comment: able to brielfy maintain static standing without UE support, reliant on UEs and external assist  for dynamic tasks                             Pertinent Vitals/Pain Pain Assessment: No/denies pain Pain Score: 0-No pain Pain Intervention(s): Monitored during session    Home Living Family/patient expects to be discharged to:: Skilled nursing facility Living Arrangements: Alone Available Help at Discharge: Family;Friend(s);Available PRN/intermittently Type of Home: Apartment Home Access: Level entry;Elevator     Home Layout: One level Home Equipment: Grab bars - tub/shower;Grab bars - toilet Additional Comments: High rise 8th floor apartment with elevator access    Prior Function Level of Independence: Independent         Comments: PTA CVA in [place],  ptIndependent with ADLs/IADLs without AD. Patient was still driving. Eats out most of the time but doesn't do much cooking. Pt reports that he currently is Ind and transferring Ind'ly but do not believe this to be accurate. Pt unable to recall  being at SNF and calling it "the  other room where his stuff is"     Hand Dominance   Dominant Hand: Right    Extremity/Trunk Assessment   Upper Extremity Assessment Upper Extremity Assessment: Defer to OT evaluation RUE Deficits / Details: ~90 degrees R shoulder FF, poor grasp/grip strength, difficulty doning socks, holding utensils RUE Sensation: WNL RUE Coordination: decreased fine motor;decreased gross motor LUE Deficits / Details: Decreased strength and coordination. WFL for PROM. LUE Coordination: decreased gross motor    Lower Extremity Assessment Lower Extremity Assessment:  (grossly 4/5 bil LEs)    Cervical / Trunk Assessment Cervical / Trunk Assessment: Normal  Communication   Communication: Expressive difficulties  Cognition Arousal/Alertness: Awake/alert Behavior During Therapy: Impulsive Overall Cognitive Status: Impaired/Different from baseline Area of Impairment: Problem solving;Awareness;Following commands                 Orientation Level: Disoriented to;Time;Place;Situation   Memory: Decreased short-term memory Following Commands: Follows one step commands consistently;Follows one step commands with increased time;Follows multi-step commands inconsistently Safety/Judgement: Decreased awareness of deficits;Decreased awareness of safety   Problem Solving: Slow processing;Difficulty sequencing;Requires verbal cues;Decreased initiation;Requires tactile cues General Comments: verbal and tactile cues needed for sequencing with mobility with increased time for processing.      General Comments      Exercises     Assessment/Plan    PT Assessment Patient needs continued PT services  PT Problem List Decreased strength;Decreased activity tolerance;Decreased balance;Decreased mobility;Decreased cognition;Decreased knowledge of use of DME;Decreased safety awareness;Decreased knowledge of precautions       PT Treatment Interventions DME instruction;Gait training;Stair  training;Functional mobility training;Therapeutic activities;Therapeutic exercise;Balance training;Neuromuscular re-education;Cognitive remediation;Patient/family education    PT Goals (Current goals can be found in the Care Plan section)  Acute Rehab PT Goals Patient Stated Goal: get better PT Goal Formulation: With patient Time For Goal Achievement: 05/22/20 Potential to Achieve Goals: Good    Frequency Min 2X/week   Barriers to discharge        Co-evaluation               AM-PAC PT "6 Clicks" Mobility  Outcome Measure Help needed turning from your back to your side while in a flat bed without using bedrails?: A Little Help needed moving from lying on your back to sitting on the side of a flat bed without using bedrails?: A Little Help needed moving to and from a bed to a chair (including a wheelchair)?: A Little Help needed standing up from a chair using your arms (e.g., wheelchair or bedside chair)?: A Little Help needed to walk in hospital room?: A Little Help needed climbing 3-5 steps with a railing? : A Lot 6 Click Score: 17    End of Session Equipment Utilized During Treatment: Gait belt Activity Tolerance: Patient tolerated treatment well Patient left: in chair;with call bell/phone within reach;with chair alarm set Nurse Communication: Mobility status PT Visit Diagnosis: Unsteadiness on feet (R26.81);Other abnormalities of gait and mobility (R26.89);Difficulty in walking, not elsewhere classified (R26.2);Other symptoms and signs involving the nervous system (R29.898)    Time: 1335-1401 PT Time Calculation (min) (ACUTE ONLY): 26 min   Charges:   PT Evaluation $PT Eval Low Complexity: 1 Low PT Treatments $Gait Training: 8-22  mins        Baxter Flattery, PT  Acute Rehab Dept [facility]) [phone] Pager [phone]  05/24/2020   [facility] 05/24/2020, 2:52 PM

## 2020-05-24 NOTE — Plan of Care (Signed)
  Problem: Activity: Goal: Risk for activity intolerance will decrease Outcome: Adequate for Discharge   

## 2020-05-24 NOTE — Evaluation (Signed)
Occupational Therapy Evaluation Patient Details Name: Gregory Nunez. MRN: [id number] DOB: [birth year] Today's Date: 05/24/2020    History of Present Illness Patient is a 75 year old African-American gentleman, history of prostate cancer, diabetes, hyperlipidemia, hypertension, use disorder recently hospitalized 04/23/2020>>> 05/08/2020 at [facility] for acute CVA and possible alcohol withdrawal and discharged to nursing facility on aspirin and Plavix presenting to the ED with concerns for melanotic stools.  At facility labs were obtained and patient noted to be severely anemic and patient transported to the ED.  On presentation patient noted to have a hemoglobin of 3.6.  Patient transfused 3 units packed red blood cells.   Clinical Impression   Pt presents with decline in function and safety with ADLs and ADL mobility with impaired safety awareness, R UE strength/AROM, balance, endurance and cognition. PTA, pt was a a SNF for rehab following CVA back in January. Pt would benefit from acute OT services to address impairments to maximize level of function and safety    Follow Up Recommendations  SNF;Supervision/Assistance - 24 hour    Equipment Recommendations  3 in 1 bedside commode;Other (comment) (RW, TBD at SNF)    Recommendations for Other Services       Precautions / Restrictions Precautions Precautions: Fall Restrictions Weight Bearing Restrictions: No      Mobility Bed Mobility               General bed mobility comments: pt up in recliner upon arrival    Transfers Overall transfer level: Needs assistance Equipment used: Rolling walker (2 wheeled);None Transfers: Sit to/from Stand Sit to Stand: Min assist Stand pivot transfers: Min assist       General transfer comment: Cues for hand placement orientation and sequencing. Pt ambulated x 2 ft with RW to BSC min A to control RW    Balance Overall balance assessment: Needs assistance Sitting-balance support: Feet  supported;No upper extremity supported Sitting balance-Leahy Scale: Fair     Standing balance support: Bilateral upper extremity supported;During functional activity Standing balance-Leahy Scale: Poor                             ADL either performed or assessed with clinical judgement   ADL Overall ADL's : Needs assistance/impaired Eating/Feeding: Sitting;Set up;Supervision/ safety;Minimal assistance   Grooming: Wash/dry hands;Wash/dry face;Minimal assistance;Sitting   Upper Body Bathing: Minimal assistance Upper Body Bathing Details (indicate cue type and reason): simulated seated in recliner Lower Body Bathing: Moderate assistance Lower Body Bathing Details (indicate cue type and reason): simulated seated i recliner Upper Body Dressing : Minimal assistance;Sitting Upper Body Dressing Details (indicate cue type and reason): To don hospital gown. Lower Body Dressing: Moderate assistance Lower Body Dressing Details (indicate cue type and reason): mod A to doff/don socks, impaired [place] Toilet Transfer: RW;Minimal assistance;Ambulation;BSC;Cueing for safety;Cueing for sequencing   Toileting- Clothing Manipulation and Hygiene: Moderate assistance;Sit to/from stand       Functional mobility during ADLs: Minimal assistance       Vision Patient Visual Report: No change from baseline       Perception     Praxis      Pertinent Vitals/Pain Pain Assessment: No/denies pain Pain Score: 0-No pain Pain Intervention(s): Monitored during session     Hand Dominance Right   Extremity/Trunk Assessment Upper Extremity Assessment Upper Extremity Assessment: Generalized weakness;RUE deficits/detail RUE Deficits / Details: ~90 degrees R shoulder FF, poor grasp/grip strength, difficulty doning socks, holding utensils RUE Sensation: WNL  RUE Coordination: decreased fine motor;decreased gross motor LUE Deficits / Details: Decreased strength and coordination. WFL for PROM. LUE  Coordination: decreased gross motor       Cervical / Trunk Assessment Cervical / Trunk Assessment: Normal   Communication Communication Communication: Expressive difficulties   Cognition Arousal/Alertness: Awake/alert Behavior During Therapy: Impulsive Overall Cognitive Status: Impaired/Different from baseline Area of Impairment: Problem solving;Awareness;Following commands                 Orientation Level: Disoriented to;Time;Place;Situation   Memory: Decreased short-term memory Following Commands: Follows one step commands consistently;Follows one step commands with increased time;Follows multi-step commands inconsistently     Problem Solving: Slow processing;Difficulty sequencing;Requires verbal cues;Decreased initiation;Requires tactile cues General Comments: verbal and tactile cues needed for sequencing with mobility with increased time for processing.   General Comments       Exercises     Shoulder Instructions      Home Living Family/patient expects to be discharged to:: Skilled nursing facility (pt has been at [facility] on rehab since CVA in January) Living Arrangements: Alone Available Help at Discharge: Family;Friend(s);Available PRN/intermittently Type of Home: Apartment Home Access: Level entry;Elevator     Home Layout: One level     Bathroom Shower/Tub: Teacher, early years/pre: Standard     Home Equipment: Grab bars - tub/shower;Grab bars - toilet   Additional Comments: High rise 8th floor apartment with elevator access      Prior Functioning/Environment Level of Independence: Independent        Comments: [place] CV[place] in [place], ptIndependent with ADLs/IADLs without AD. Patient was still driving. Eats out most of the time but doesn't do much cooking. Pt reports that he currently is Ind and transferring Ind'ly but do not believe this to be accurate. Pt unable to recall  being at SNF and calling it "the other room where his stuff is"         OT Problem List: Decreased strength;Decreased range of motion;Impaired balance (sitting and/or standing);Decreased coordination;Decreased safety awareness;Decreased knowledge of use of DME or AE;Impaired UE functional use;Decreased activity tolerance      OT Treatment/Interventions: Self-care/ADL training;Therapeutic exercise;Neuromuscular education;Energy conservation;DME and/or AE instruction;Therapeutic activities;Patient/family education;Balance training;Cognitive remediation/compensation    OT Goals(Current goals can be found in the care plan section) Acute Rehab OT Goals Patient Stated Goal: get better OT Goal Formulation: With patient Time For Goal Achievement: 06/07/20 Potential to Achieve Goals: Good ADL Goals Pt Will Perform Grooming: with min guard assist;with supervision;with set-up;standing Pt Will Perform Upper Body Bathing: with min assist;with supervision;with set-up;sitting Pt Will Perform Lower Body Bathing: with min assist;with min guard assist;sit to/from stand Pt Will Perform Upper Body Dressing: with min guard assist;with supervision;with set-up;sitting Pt Will Transfer to Toilet: with min guard assist;ambulating;regular height toilet;bedside commode;grab bars Pt Will Perform Toileting - Clothing Manipulation and hygiene: with min assist;with min guard assist;sit to/from stand  OT Frequency: Min 2X/week   Barriers to D/C:            Co-evaluation              AM-PAC OT "6 Clicks" Daily Activity     Outcome Measure Help from another person eating meals?: A Little Help from another person taking care of personal grooming?: A Little Help from another person toileting, which includes using toliet, bedpan, or urinal?: A Lot Help from another person bathing (including washing, rinsing, drying)?: A Lot Help from another person to put on and taking off regular upper body  clothing?: A Little Help from another person to put on and taking off regular lower body  clothing?: A Lot 6 Click Score: 15   End of Session Equipment Utilized During Treatment: Gait belt;Rolling walker;Other (comment) (BSC)  Activity Tolerance: Patient tolerated treatment well Patient left: in chair;with call bell/phone within reach;with chair alarm set  OT Visit Diagnosis: Muscle weakness (generalized) (M62.81);Ataxia, unspecified (R27.0);Other symptoms and signs involving the nervous system (R29.898);Hemiplegia and hemiparesis;Unsteadiness on feet (R26.81);Other symptoms and signs involving cognitive function;Cognitive communication deficit (R41.841) Symptoms and signs involving cognitive functions: Cerebral infarction Hemiplegia - Right/Left: Right Hemiplegia - dominant/non-dominant: Dominant Hemiplegia - caused by: Cerebral infarction                Time: 1110-1136 OT Time Calculation (min): 26 min Charges:  OT General Charges $OT Visit: 1 Visit OT Evaluation $OT Eval Moderate Complexity: 1 Mod OT Treatments $Self Care/Home Management : 8-22 mins    Britt Bottom 05/24/2020, 12:07 PM

## 2020-05-25 DIAGNOSIS — Z7289 Other problems related to lifestyle: Secondary | ICD-10-CM | POA: Diagnosis not present

## 2020-05-25 DIAGNOSIS — N179 Acute kidney failure, unspecified: Secondary | ICD-10-CM | POA: Diagnosis not present

## 2020-05-25 DIAGNOSIS — I639 Cerebral infarction, unspecified: Secondary | ICD-10-CM | POA: Diagnosis not present

## 2020-05-25 DIAGNOSIS — K922 Gastrointestinal hemorrhage, unspecified: Secondary | ICD-10-CM | POA: Diagnosis not present

## 2020-05-25 LAB — GLUCOSE, CAPILLARY
Glucose-Capillary: 182 mg/dL — ABNORMAL HIGH (ref 70–99)
Glucose-Capillary: 199 mg/dL — ABNORMAL HIGH (ref 70–99)
Glucose-Capillary: 205 mg/dL — ABNORMAL HIGH (ref 70–99)
Glucose-Capillary: 211 mg/dL — ABNORMAL HIGH (ref 70–99)
Glucose-Capillary: 82 mg/dL (ref 70–99)
Glucose-Capillary: 99 mg/dL (ref 70–99)

## 2020-05-25 LAB — TYPE AND SCREEN
ABO/RH(D): O POS
Antibody Screen: NEGATIVE
Unit division: 0
Unit division: 0
Unit division: 0
Unit division: 0
Unit division: 0
Unit division: 0

## 2020-05-25 LAB — CBC
HCT: 29.3 % — ABNORMAL LOW (ref 39.0–52.0)
Hemoglobin: 9.3 g/dL — ABNORMAL LOW (ref 13.0–17.0)
MCH: 28 pg (ref 26.0–34.0)
MCHC: 31.7 g/dL (ref 30.0–36.0)
MCV: 88.3 fL (ref 80.0–100.0)
Platelets: 124 10*3/uL — ABNORMAL LOW (ref 150–400)
RBC: 3.32 MIL/uL — ABNORMAL LOW (ref 4.22–5.81)
RDW: 15.8 % — ABNORMAL HIGH (ref 11.5–15.5)
WBC: 5.6 10*3/uL (ref 4.0–10.5)
nRBC: 0 % (ref 0.0–0.2)

## 2020-05-25 LAB — BPAM RBC
Blood Product Expiration Date: 202202252359
Blood Product Expiration Date: 202202252359
Blood Product Expiration Date: 202202252359
Blood Product Expiration Date: 202203062359
Blood Product Expiration Date: 202203062359
Blood Product Expiration Date: 202203062359
ISSUE DATE / TIME: 202202021545
ISSUE DATE / TIME: 202202022119
ISSUE DATE / TIME: 202202030211
ISSUE DATE / TIME: 202202040850
ISSUE DATE / TIME: 202202041129
Unit Type and Rh: 5100
Unit Type and Rh: 5100
Unit Type and Rh: 5100
Unit Type and Rh: 5100
Unit Type and Rh: 5100
Unit Type and Rh: 5100

## 2020-05-25 LAB — BASIC METABOLIC PANEL
Anion gap: 7 (ref 5–15)
BUN: 19 mg/dL (ref 8–23)
CO2: 22 mmol/L (ref 22–32)
Calcium: 8.1 mg/dL — ABNORMAL LOW (ref 8.9–10.3)
Chloride: 108 mmol/L (ref 98–111)
Creatinine, Ser: 1.35 mg/dL — ABNORMAL HIGH (ref 0.61–1.24)
GFR, Estimated: 56 mL/min — ABNORMAL LOW (ref >60)
Glucose, Bld: 95 mg/dL (ref 70–99)
Potassium: 4.1 mmol/L (ref 3.5–5.1)
Sodium: 137 mmol/L (ref 135–145)

## 2020-05-25 LAB — MAGNESIUM: Magnesium: 1.8 mg/dL (ref 1.7–2.4)

## 2020-05-25 MED ORDER — SODIUM CHLORIDE 0.9 % IV SOLN
510.0000 mg | Freq: Once | INTRAVENOUS | Status: AC
  Administered 2020-05-25: 510 mg via INTRAVENOUS
  Filled 2020-05-25: qty 510

## 2020-05-25 MED ORDER — MAGNESIUM SULFATE 4 GM/100ML IV SOLN
4.0000 g | Freq: Once | INTRAVENOUS | Status: AC
  Administered 2020-05-25: 4 g via INTRAVENOUS
  Filled 2020-05-25: qty 100

## 2020-05-25 NOTE — Progress Notes (Addendum)
PROGRESS NOTE    Gregory Nunez Gregory Nunez.  ION:[id number] DOB: 01-28-1946 DOA: 05/21/2020 PCP: [name], MD   Chief Complaint  Patient presents with  . Anemia    Brief Narrative:  Patient is a 75 year old African-American gentleman, history of prostate cancer, diabetes, hyperlipidemia, hypertension, use disorder recently hospitalized 04/23/2020>>> 05/08/2020 at [facility] for acute CVA and possible alcohol withdrawal and discharged to nursing facility on aspirin and Plavix presenting to the ED with concerns for melanotic stools.  At facility labs were obtained and patient noted to be severely anemic and patient transported to the ED.  On presentation patient noted to have a hemoglobin of 3.6.  Patient transfused 3 units packed red blood cells.  Hemoglobin currently at 7.1.  GI consultation pending.   Assessment & Plan:   Principal Problem:   GIB (gastrointestinal bleeding) Active Problems:   Symptomatic anemia   Iron deficiency anemia   Hypertension associated with diabetes ([place])   Type 2 diabetes mellitus without complication (HCC)   Acute CVA (cerebrovascular accident) ([place])   Hyperlipidemia associated with type 2 diabetes mellitus ([place])   Alcohol use   AKI (acute kidney injury) ([place])   Tobacco use   Constipation  1 concern for upper GI bleed/symptomatic anemia/iron deficiency anemia Likely secondary to gastroduodenal AVMs status post APC/minimal gastritis noted on upper endoscopy in the setting of antiplatelets.  Patient recent diagnosis of acute CVA discharged on aspirin and Plavix x3 weeks and then subsequently aspirin alone, history of alcohol use.  On presentation hemoglobin noted at 3.6.  Patient transfused a total of 5 units packed red blood cells during this hospitalization hemoglobin currently at 9.3 from 9.7 from 3.6 on admission.  -Anemia panel consistent with iron deficiency anemia with iron level of 11, ferritin of 11, folate of 7.1.   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   Protonix drip has been transitioned to IV PPI and can subsequently transition to oral Protonix daily tomorrow.  -IV Feraheme x1. -Transfusion threshold hemoglobin < 7.  -May need oral iron supplementation on discharge.   -We will need outpatient follow-up with GI.   2.  Acute kidney injury on chronic kidney disease stage IIIa  Likely secondary to a prerenal azotemia in the setting of GI bleed.  Patient transfused 5 units packed red blood cells during this hospitalization.  Renal function improved.  Creatinine currently at baseline at 1.35.  IV fluids have been saline lock.  Follow.  3.  History of CVA Patient with recent history of CVA during recent hospitalization (04/23/2020-05/08/2020) and placed on DAPT 04/25/2020 which he should have completed on 05/16/2020 and to continue aspirin alone at that point.  Plavix discontinued.  EC ASA 81mg  resumed per GI recommendations.  Continue statin.  PT/OT.  SLP.  4.  Hypertension Continue Coreg and Norvasc.  IV fluids have been saline lock.  Follow.   5.  Diabetes mellitus type 2 Hemoglobin A1c 6.5 (/09/2020).  CBG 99 this morning.  On a heart healthy diet.  Continue Lantus 5 units daily, sliding scale insulin.   6.  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  Continue thiamine, folic acid, multivitamin.    7.  Chronic constipation Laxatives as needed.  8.  Tobacco abuse Nicotine patch.  9.  Hypomagnesemia Magnesium  at 1.8.  Magnesium sulfate 4 g IV x1.  Follow.     DVT prophylaxis: SCDs Code Status: Full Family Communication: Updated brother at bedside. Disposition:   Status is: Inpatient    Dispo: The patient is from: SNF              Anticipated d/c is  to: SNF              Anticipated d/c date is: 05/26/2020              Patient currently with concerns for GI bleed, severe anemia, transfuse packed red blood cells, seen by GI and underwent endoscopy and colonoscopy, on IV PPI.  Not stable for discharge.   Difficult to place patient undetermined       Consultants:   Gastroenterology: Dr. Silverio Decamp 05/22/2020  Procedures:   Transfuse 3 units packed red blood cells 05/21/2020-05/22/2020  Transfused 2 units packed red blood cells 05/23/2020  Upper endoscopy/colonoscopy 05/23/2020 per Dr. Lyndel Safe  Antimicrobials:  None   Subjective: Sitting up in chair.  Denies any chest pain.  No shortness of breath.  No abdominal pain.  Denies any bloody bowel movement/melanotic stools.  Objective: Vitals:   05/24/20 1502 05/24/20 2030 05/25/20 0510 05/25/20 0841  BP: 139/70 (!) 142/72 133/81 (!) 150/75  Pulse: 69 67 69 76  Resp: 16 16 18    Temp: 98.4 F (36.9 C) 98.2 F (36.8 C) 98.8 F (37.1 C)   TempSrc: Oral Oral Oral   SpO2: 100% 100% 100%   Weight:      Height:        Intake/Output Summary (Last 24 hours) at 05/25/2020 1133 Last data filed at 05/25/2020 0522 Gross per 24 hour  Intake 467.49 ml  Output 650 ml  Net -182.51 ml   Filed Weights   05/23/20 0400 05/23/20 0525  Weight: 79.9 kg 79.9 kg    Examination:  General exam: : NAD Respiratory system: CTA B.  No wheezes, no rhonchi.  Speaking in full sentences.  Normal respiratory effort. Cardiovascular system: Regular rate and rhythm no murmurs rubs or gallops.  No JVD.  No lower extremity edema.  Gastrointestinal system: Abdomen soft, nontender, nondistended, positive bowel sounds.  No rebound.  No guarding. Central nervous system: Alert and oriented to self and place. No focal neurological deficits. Extremities: Symmetric 5 x 5 power. Skin: No rashes, lesions or ulcers Psychiatry: Judgement and insight appear poor to fair. Mood & affect appropriate.  Data Reviewed: I have  personally reviewed following labs and imaging studies  CBC: Recent Labs  Lab 05/21/20 1337 05/21/20 2132 05/22/20 0400 05/22/20 0832 05/23/20 0315 05/23/20 1632 05/24/20 0554 05/24/20 1413 05/25/20 0615  WBC 4.4  --  6.4  --   --   --  6.8  --  5.6  NEUTROABS 3.1  --   --   --   --   --  5.1  --   --   HGB 3.6*   < > 6.7*   < > 6.8* 9.1* 9.7* 9.7* 9.3*  HCT 12.4*   < > 20.9*   < > 21.4* 28.7* 30.4* 30.3* 29.3*  MCV 93.2  --  89.3  --   --   --  88.1  --  88.3  PLT 184  --  182  --   --   --  141*  --  124*   < > = values in this interval not displayed.    Basic Metabolic Panel: Recent  Labs  Lab 05/21/20 1337 05/22/20 0400 05/23/20 0315 05/24/20 0554 05/25/20 0615  NA 139 143 145 143 137  K 4.3 3.9 3.9 3.6 4.1  CL 110 112* 115* 113* 108  CO2 20* 20* 21* 22 22  GLUCOSE 364* 164* 208* 112* 95  BUN 33* 31* 23 17 19   CREATININE 1.78* 1.68* 1.46* 1.31* 1.35*  CALCIUM 8.1* 8.2* 7.7* 8.2* 8.1*  MG  --   --  1.8 1.5* 1.8  PHOS  --   --   --  2.7  --     GFR: Estimated Creatinine Clearance: 52.7 mL/min (A) (by C-G formula based on SCr of 1.35 mg/dL (H)).  Liver Function Tests: Recent Labs  Lab 05/22/20 0400 05/24/20 0554  AST 23  --   ALT 19  --   ALKPHOS 44  --   BILITOT 0.7  --   PROT 6.5  --   ALBUMIN 2.7* 2.6*    CBG: Recent Labs  Lab 05/24/20 2031 05/25/20 0013 05/25/20 0512 05/25/20 0720 05/25/20 1126  GLUCAP 200* 211* 82 99 199*     Recent Results (from the past 240 hour(s))  SARS CORONAVIRUS 2 (TAT 6-24 HRS) Nasopharyngeal Nasopharyngeal Swab     Status: None   Collection Time: 05/21/20  1:43 PM   Specimen: Nasopharyngeal Swab  Result Value Ref Range Status   SARS Coronavirus 2 NEGATIVE NEGATIVE Final    Comment: (NOTE) SARS-CoV-2 target nucleic acids are NOT DETECTED.  The SARS-CoV-2 RNA is generally detectable in upper and lower respiratory specimens during the acute phase of infection. Negative results do not preclude SARS-CoV-2  infection, do not rule out co-infections with other pathogens, and should not be used as the sole basis for treatment or other patient management decisions. Negative results must be combined with clinical observations, patient history, and epidemiological information. The expected result is Negative.  Fact Sheet for Patients: SugarRoll.be  Fact Sheet for Healthcare Providers: https://www.woods-mathews.com/  This test is not yet approved or cleared by the [place] FDA and  has been authorized for detection and/or diagnosis of SARS-CoV-2 by FDA under an Emergency Use Authorization (EUA). This EUA will remain  in effect (meaning this test can be used) for the duration of the COVID-19 declaration under Se ction 564(b)(1) of the Act, 21 U.S.C. section 360bbb-3(b)(1), unless the authorization is terminated or revoked sooner.  Performed at [facility], [place] [address], [place], [place] [address]          Radiology Studies: No results found.      Scheduled Meds: . sodium chloride   Intravenous Once  . amLODipine  5 mg Oral Daily  . aspirin EC  81 mg Oral Daily  . atorvastatin  40 mg Oral QHS  . carvedilol  25 mg Oral BID WC  . folic acid  1 mg Oral Daily  . insulin aspart  0-9 Units Subcutaneous Q4H  . insulin glargine  5 Units Subcutaneous Daily  . multivitamin with minerals  1 tablet Oral Daily  . nicotine  14 mg Transdermal Daily  . pantoprazole  40 mg Intravenous Q12H  . thiamine  100 mg Oral Daily   Continuous Infusions: . magnesium sulfate bolus IVPB 4 g (05/25/20 1053)     LOS: 4 days    Time spent: 35 minutes    Irine Seal, MD [facility]   To contact the attending provider between 7A-7P or the covering provider during after hours 7P-7A, please log into the web  site [email or web address] and access using universal [place] password for that web site. If you do not have the password, please call  the hospital operator.  05/25/2020, 11:33 AM

## 2020-05-25 NOTE — Plan of Care (Signed)
  Problem: Health Behavior/Discharge Planning: Goal: Ability to manage health-related needs will improve Outcome: Progressing   Problem: Clinical Measurements: Goal: Will remain free from infection Outcome: Progressing   Problem: Activity: Goal: Risk for activity intolerance will decrease Outcome: Progressing   Problem: Elimination: Goal: Will not experience complications related to bowel motility Outcome: Progressing Goal: Will not experience complications related to urinary retention Outcome: Progressing   Problem: Pain Managment: Goal: General experience of comfort will improve Outcome: Progressing   Problem: Safety: Goal: Ability to remain free from injury will improve Outcome: Progressing   Problem: Skin Integrity: Goal: Risk for impaired skin integrity will decrease Outcome: Progressing   

## 2020-05-26 ENCOUNTER — Encounter (HOSPITAL_COMMUNITY): Payer: Self-pay | Admitting: Gastroenterology

## 2020-05-26 DIAGNOSIS — K5900 Constipation, unspecified: Secondary | ICD-10-CM | POA: Diagnosis not present

## 2020-05-26 DIAGNOSIS — K922 Gastrointestinal hemorrhage, unspecified: Principal | ICD-10-CM

## 2020-05-26 DIAGNOSIS — N179 Acute kidney failure, unspecified: Secondary | ICD-10-CM | POA: Diagnosis not present

## 2020-05-26 DIAGNOSIS — Z7289 Other problems related to lifestyle: Secondary | ICD-10-CM | POA: Diagnosis not present

## 2020-05-26 DIAGNOSIS — K921 Melena: Secondary | ICD-10-CM

## 2020-05-26 LAB — MAGNESIUM: Magnesium: 2 mg/dL (ref 1.7–2.4)

## 2020-05-26 LAB — BASIC METABOLIC PANEL
Anion gap: 8 (ref 5–15)
BUN: 20 mg/dL (ref 8–23)
CO2: 21 mmol/L — ABNORMAL LOW (ref 22–32)
Calcium: 7.9 mg/dL — ABNORMAL LOW (ref 8.9–10.3)
Chloride: 107 mmol/L (ref 98–111)
Creatinine, Ser: 1.35 mg/dL — ABNORMAL HIGH (ref 0.61–1.24)
GFR, Estimated: 56 mL/min — ABNORMAL LOW (ref >60)
Glucose, Bld: 108 mg/dL — ABNORMAL HIGH (ref 70–99)
Potassium: 3.9 mmol/L (ref 3.5–5.1)
Sodium: 136 mmol/L (ref 135–145)

## 2020-05-26 LAB — GLUCOSE, CAPILLARY
Glucose-Capillary: 103 mg/dL — ABNORMAL HIGH (ref 70–99)
Glucose-Capillary: 107 mg/dL — ABNORMAL HIGH (ref 70–99)
Glucose-Capillary: 121 mg/dL — ABNORMAL HIGH (ref 70–99)
Glucose-Capillary: 143 mg/dL — ABNORMAL HIGH (ref 70–99)
Glucose-Capillary: 190 mg/dL — ABNORMAL HIGH (ref 70–99)

## 2020-05-26 LAB — HEMOGLOBIN AND HEMATOCRIT, BLOOD
HCT: 26.5 % — ABNORMAL LOW (ref 39.0–52.0)
Hemoglobin: 8.5 g/dL — ABNORMAL LOW (ref 13.0–17.0)

## 2020-05-26 LAB — SURGICAL PATHOLOGY

## 2020-05-26 LAB — SARS CORONAVIRUS 2 (TAT 6-24 HRS): SARS Coronavirus 2: NEGATIVE

## 2020-05-26 MED ORDER — SENNOSIDES-DOCUSATE SODIUM 8.6-50 MG PO TABS: 2.0000 | ORAL_TABLET | Freq: Every day | ORAL | Status: DC

## 2020-05-26 MED ORDER — PANTOPRAZOLE SODIUM 40 MG PO TBEC: 40.0000 mg | DELAYED_RELEASE_TABLET | Freq: Every day | ORAL | Status: DC

## 2020-05-26 MED ORDER — FOLIC ACID 1 MG PO TABS: 1.0000 mg | ORAL_TABLET | Freq: Every day | ORAL | Status: DC

## 2020-05-26 MED ORDER — ADULT MULTIVITAMIN W/MINERALS CH: 1.0000 | ORAL_TABLET | Freq: Every day | ORAL | Status: DC

## 2020-05-26 MED ORDER — POLYSACCHARIDE IRON COMPLEX 150 MG PO CAPS: 150.0000 mg | ORAL_CAPSULE | Freq: Every day | ORAL | Status: DC

## 2020-05-26 MED ORDER — THIAMINE HCL 100 MG PO TABS: 100.0000 mg | ORAL_TABLET | Freq: Every day | ORAL | Status: DC

## 2020-05-26 NOTE — NC FL2 (Signed)
[place] LEVEL OF CARE SCREENING TOOL     IDENTIFICATION  Patient Name: Gregory Nunez. Birthdate: 05/21/1945 Sex: male Admission Date (Current Location): 05/21/2020  [facility] and [place] Number:  Herbalist and Address:  [facility],  [place], [place]      Provider Number: [id number]  Attending Physician Name and Address:  Eugenie Filler, MD  Relative Name and Phone Number:  Legrand Como nephew [phone]    Current Level of Care: Hospital Recommended Level of Care: [place] Prior Approval Number:    Date Approved/Denied:   PASRR Number:  [id number])  Discharge Plan: SNF    Current Diagnoses: Patient Active Problem List   Diagnosis Date Noted  . Iron deficiency anemia 05/22/2020  . Constipation 05/22/2020  . GIB (gastrointestinal bleeding) 05/21/2020  . Tobacco abuse   . 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 ([place])   . Acute CVA (cerebrovascular accident) ([place]) 04/23/2020  . Hyperlipidemia associated with type 2 diabetes mellitus ([place]) 04/23/2020  . Alcohol use 04/23/2020  . AKI (acute kidney injury) ([place]) 04/23/2020  . Tobacco use 04/23/2020  . Unresponsive 11/08/2018  . Syncope, convulsive 11/08/2018  . Hypertension associated with diabetes ([place]) 11/08/2018  . Alcohol withdrawal seizure with complication, with unspecified complication ([place]) 32/44/0102  . Type 2 diabetes mellitus without complication ([place]) 72/53/6644  . Symptomatic anemia 04/15/2018  . Prostate cancer ([place]) 02/27/2014  . Malignant neoplasm of prostate ([place]) 01/09/2014  . Bunion 01/17/2013  . 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    Orientation RESPIRATION BLADDER Height & Weight     Self,Place  Normal   Weight: 79.9 kg Height:  5\' 11"  (180.3 cm)  BEHAVIORAL SYMPTOMS/MOOD NEUROLOGICAL BOWEL NUTRITION STATUS      Continent Diet (dysphagia 3)  AMBULATORY STATUS COMMUNICATION OF NEEDS  Skin   Limited Assist Verbally Normal                       Personal Care Assistance Level of Assistance  Bathing,Feeding,Dressing Bathing Assistance: Limited assistance Feeding assistance: Limited assistance Dressing Assistance: Limited assistance     Functional Limitations Info  Sight,Speech,Hearing Sight Info: Impaired (eyeglasses) Hearing Info: Adequate Speech Info: Impaired (Dentures top/bottom)    SPECIAL CARE FACTORS FREQUENCY  PT (By licensed PT),OT (By licensed OT)     PT Frequency: 5x/week OT Frequency: 5x/week            Contractures Contractures Info: Not present    Additional Factors Info  Code Status,Allergies,Insulin Sliding Scale   Allergies Info:  (Penicillins, Penicillins)   Insulin Sliding Scale Info:  (SSI-See MAR)       Current Medications (05/26/2020):  This is the current hospital active medication list Current Facility-Administered Medications  Medication Dose Route Frequency Provider Last Rate Last Admin  . 0.9 %  sodium chloride infusion (Manually program via Guardrails IV Fluids)   Intravenous Once Hollace Hayward K, NP      . acetaminophen (TYLENOL) tablet 650 mg  650 mg Oral Q6H PRN Marylyn Ishihara, Tyrone A, DO   650 mg at 05/22/20 0041   Or  . acetaminophen (TYLENOL) suppository 650 mg  650 mg Rectal Q6H PRN Marylyn Ishihara, Tyrone A, DO      . albuterol (VENTOLIN HFA) 108 (90 Base) MCG/ACT inhaler 2 puff  2 puff Inhalation Q6H PRN Kyle, Tyrone A, DO      . amLODipine (NORVASC) tablet 5 mg  5 mg Oral  Daily Eugenie Filler, MD   5 mg at 05/26/20 0831  . aspirin EC tablet 81 mg  81 mg Oral Daily Eugenie Filler, MD   81 mg at 05/26/20 0831  . atorvastatin (LIPITOR) tablet 40 mg  40 mg Oral QHS Kyle, Tyrone A, DO   40 mg at 05/25/20 2203  . carvedilol (COREG) tablet 25 mg  25 mg Oral BID WC Eugenie Filler, MD   25 mg at 05/26/20 0831  . folic acid (FOLVITE) tablet 1 mg  1 mg Oral Daily Kyle, Tyrone A, DO   1 mg at 05/26/20 0830  . insulin aspart  (novoLOG) injection 0-9 Units  0-9 Units Subcutaneous Q4H Eugenie Filler, MD   1 Units at 05/26/20 0022  . insulin glargine (LANTUS) injection 5 Units  5 Units Subcutaneous Daily Eugenie Filler, MD   5 Units at 05/25/20 2203  . multivitamin with minerals tablet 1 tablet  1 tablet Oral Daily Kyle, Tyrone A, DO   1 tablet at 05/26/20 0831  . nicotine (NICODERM CQ - dosed in mg/24 hours) patch 14 mg  14 mg Transdermal Daily Eugenie Filler, MD   14 mg at 05/26/20 0834  . ondansetron (ZOFRAN) tablet 4 mg  4 mg Oral Q6H PRN Marylyn Ishihara, Tyrone A, DO       Or  . ondansetron (ZOFRAN) injection 4 mg  4 mg Intravenous Q6H PRN Marylyn Ishihara, Tyrone A, DO      . [START ON 05/27/2020] pantoprazole (PROTONIX) EC tablet 40 mg  40 mg Oral Q0600 Eugenie Filler, MD      . pantoprazole (PROTONIX) injection 40 mg  40 mg Intravenous Q12H Eugenie Filler, MD   40 mg at 05/26/20 0830  . polyethylene glycol (MIRALAX / GLYCOLAX) packet 17 g  17 g Oral Daily PRN Eugenie Filler, MD      . thiamine tablet 100 mg  100 mg Oral Daily Kyle, Tyrone A, DO   100 mg at 05/26/20 0831     Discharge Medications: Please see discharge summary for a list of discharge medications.  Relevant Imaging Results:  Relevant Lab Results:   Additional Information SS#: [id number]  Rhianon Zabawa, Juliann Pulse, RN

## 2020-05-26 NOTE — Anesthesia Postprocedure Evaluation (Signed)
Anesthesia Post Note  Patient: Gurveer Colucci.  Procedure(s) Performed: ESOPHAGOGASTRODUODENOSCOPY (EGD) WITH PROPOFOL (N/A ) HOT HEMOSTASIS (ARGON PLASMA COAGULATION/BICAP) (N/A ) POLYPECTOMY COLONOSCOPY (N/A ) BIOPSY     Patient location during evaluation: Endoscopy Anesthesia Type: MAC Level of consciousness: awake and alert Pain management: pain level controlled Vital Signs Assessment: post-procedure vital signs reviewed and stable Respiratory status: spontaneous breathing, nonlabored ventilation, respiratory function stable and patient connected to nasal cannula oxygen Cardiovascular status: blood pressure returned to baseline and stable Postop Assessment: no apparent nausea or vomiting Anesthetic complications: no   No complications documented.  Last Vitals:  Vitals:   05/26/20 0444 05/26/20 1627  BP: (!) 143/75 (!) 150/71  Pulse: 78   Resp: 20 14  Temp: 37.4 C 36.6 C  SpO2: 99% 100%    Last Pain:  Vitals:   05/26/20 1627  TempSrc: Oral  PainSc:    Pain Goal:                   Barnet Glasgow

## 2020-05-26 NOTE — TOC Transition Note (Signed)
Transition of Care [facility]) - CM/SW Discharge Note   Patient Details  Name: Gregory Nunez. MRN: [id number] Date of Birth: 1945-04-24  Transition of Care [facility]) CM/SW Contact:  Dessa Phi, RN Phone Number: 05/26/2020, 11:36 AM   Clinical Narrative: Return back to [facility] rep [place] following. Auth initiated-auth waived [id number] w/confirmation clinicals. Await d/c summary rm# tel# nsg report. PTAR-WILL CALL-Nsg to call  once ready.        Barriers to Discharge: Insurance Authorization   Patient Goals and CMS Choice Patient states their goals for this hospitalization and ongoing recovery are:: spoke to [employer] and pt may return for short term rehab      Discharge Placement              Patient chooses bed at: [facility] Patient to be transferred to facility by: [place] Name of family member notified: Legrand Como nephew [phone] Patient and family notified of of transfer: 05/26/20  Discharge Plan and Services In-house Referral: Clinical Social Work,[facility] Discharge Planning Services: CM Consult Post Acute Care Choice: [place]                               Social Determinants of Health (SDOH) Interventions     Readmission Risk Interventions No flowsheet data found.

## 2020-05-26 NOTE — Discharge Summary (Signed)
Physician Discharge Summary  Gregory Nunez. [id number] DOB: 1946/01/12 DOA: 05/21/2020  PCP: Gregory Ebbs, MD  Admit date: 05/21/2020 Discharge date: 05/26/2020  Time spent: 55 minutes  Recommendations for Outpatient Follow-up:  1. Follow-up with MD at skilled nursing facility.  Patient will need a CBC done to follow-up on H&H in 1 week as well as a basic metabolic profile done to follow-up on electrolytes and renal function.  Speech therapy will need to follow-up with patient at skilled nursing facility. 2. Follow-up with Dr. Lyndel Safe, [facility] gastroenterology in 3 to 4 weeks.   Discharge Diagnoses:  Principal Problem:   GIB (gastrointestinal bleeding) Active Problems:   Symptomatic anemia   Iron deficiency anemia   Hypertension associated with diabetes ([place])   Type 2 diabetes mellitus without complication (HCC)   Acute CVA (cerebrovascular accident) ([place])   Hyperlipidemia associated with type 2 diabetes mellitus ([place])   Alcohol use   AKI (acute kidney injury) ([place])   Tobacco use   Constipation   Melena   Upper GI bleed   Discharge Condition: Stable and improved  Diet recommendation: Heart healthy  Filed Weights   05/23/20 0400 05/23/20 0525  Weight: 79.9 kg 79.9 kg    History of present illness:  HPI per Dr. Adonis Brook Gregory Nunez. is a 75 y.o. male with medical history significant of prostate cancer, DM2, HLD, HTN. Presented with symptomatic anemia. Attempted call to [facility], but unable to reach nursing staff. Attempted call to pt's nephew but received VM only. Called to pt's brother made. He was aware that patient was in hospital but unaware of the circumstances leading to this hospitalization. Patient is a poor historian. HPI from chart review. Patient has apparently been having multiple dark stools over the last several days. The medical staff at [facility] checked labs and found him to be severely anemic. EMS was called for transfer to ED.   ED  Course: Found to have an Hgb of 3.6. [facility] was consulted. 3 units pRBCs ordered. [facility] was called for admission.   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  Patient recent diagnosis of acute CVA discharged on aspirin and Plavix x3 weeks and then subsequently aspirin alone, history of alcohol use.  On presentation hemoglobin noted at 3.6.  Patient transfused a total of 5 units packed red blood cells during this hospitalization hemoglobin stabilized at 8.5 by day of discharge from 3.6 on admission.  -Anemia panel consistent with iron deficiency anemia with iron level of 11, ferritin of 11, folate of 7.1.   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   Patient initially placed on a Protonix drip and subsequently transitioned to IV PPI twice daily and subsequently to oral Protonix on discharge.  -Status post IV Feraheme x1. -Patient will be discharged on oral iron supplementation.   -Outpatient follow-up with GI.    2.  Acute kidney injury on chronic kidney disease stage IIIa  Likely secondary to a prerenal azotemia in the setting of GI bleed.  Patient transfused 5 units packed red blood cells during this hospitalization.  Renal function improved.  Creatinine improved and was back to baseline by day of discharge.  Creatinine was 1.35 on  day of discharge.  3.  History of CVA Patient with recent history of CVA during recent hospitalization (04/23/2020-05/08/2020) and placed on DAPT 04/25/2020 which he should have completed on 05/16/2020 and to continue aspirin alone at that point.  Plavix discontinued.  EC ASA 81mg  resumed per GI  recommendations.  Continued statin.    Patient be discharged on aspirin only.  Patient seen by PT/OT and speech therapy.  Outpatient follow-up.    4.  Hypertension Patient was maintained on Coreg and Norvasc during the hospitalization.  Outpatient follow-up.    5.  Diabetes mellitus type 2 Hemoglobin A1c 6.5 (/09/2020).   Patient's oral hypoglycemic agents were held during the hospitalization and patient maintained on Lantus and sliding scale insulin.  Oral hypoglycemic agents will be resumed on discharge.  Outpatient follow-up.  6.  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    Patient maintained on thiamine, folic acid, multivitamin.    7.  Chronic constipation Laxatives as needed.  8.  Tobacco abuse Nicotine patch placed during hospitalization.  9.  Hypomagnesemia Repleted.  Outpatient follow-up.      Procedures:  Transfuse 3 units packed red blood cells 05/21/2020-05/22/2020  Transfused 2 units packed red blood cells 05/23/2020  Upper endoscopy/colonoscopy 05/23/2020 per Dr. Lyndel Safe   Consultations:  Gastroenterology: Dr. Silverio Decamp 05/22/2020    Discharge Exam: Vitals:   05/25/20 2002 05/26/20 0444  BP: (!) 145/70 (!) 143/75  Pulse: 72 78  Resp: 16 20  Temp: 98.3 F (36.8 C) 99.3 F (37.4 C)  SpO2: 100% 99%    General: NAD Cardiovascular: RRR Respiratory: CTAB  Discharge Instructions   Discharge Instructions    Diet - low sodium heart healthy   Complete by: As directed    Increase activity slowly   Complete by: As directed      Allergies as of 05/26/2020      Reactions   Penicillins    Nervous   Penicillins Anxiety, Other (See Comments)   DID THE REACTION INVOLVE: Swelling of the face/tongue/throat, SOB, or low BP? No Sudden or severe rash/hives, skin peeling, or the inside of the mouth or nose? No Did it require medical treatment? No When did it last happen?many years  ago If all above answers are "NO", may proceed with cephalosporin use.      Medication List    STOP taking these medications   clopidogrel 75 MG tablet Commonly known as: PLAVIX     TAKE these medications   albuterol 108 (90 Base) MCG/ACT inhaler Commonly known as: VENTOLIN HFA Inhale 2 puffs into the lungs every 6 (six) hours as needed for wheezing or shortness of breath.   amLODipine 10 MG tablet Commonly known as: NORVASC Take 10 mg by mouth daily.   aspirin 81 MG EC tablet Take 1 tablet (81 mg total) by mouth daily. Swallow whole.   atorvastatin 40 MG tablet Commonly known as: LIPITOR Take 1 tablet (40 mg total) by mouth daily. What changed: when to take this   carvedilol 25 MG tablet Commonly known as: COREG Take 1 tablet (25 mg total) by mouth 2 (two) times daily with a meal. What changed: additional instructions   Ensure Plus Liqd Take 237 mLs by mouth 2 (two) times daily between meals.   folic acid 1 MG tablet Commonly known as: FOLVITE Take 1 tablet (1 mg total) by mouth daily. Start taking on: May 27, 2020   iron polysaccharides 150 MG capsule  Commonly known as: Nu-Iron Take 1 capsule (150 mg total) by mouth daily.   metFORMIN 500 MG tablet Commonly known as: GLUCOPHAGE Take 500 mg by mouth 2 (two) times daily.   multivitamin with minerals Tabs tablet Take 1 tablet by mouth daily. Start taking on: May 27, 2020   nicotine 21 mg/24hr patch Commonly known as: NICODERM CQ - dosed in mg/24 hours Place 1 patch (21 mg total) onto the skin daily.   pantoprazole 40 MG tablet Commonly known as: PROTONIX Take 40 mg by mouth daily. What changed: Another medication with the same name was removed. Continue taking this medication, and follow the directions you see here.   polyethylene glycol 17 g packet Commonly known as: MIRALAX / GLYCOLAX Take 17 g by mouth daily.   senna-docusate 8.6-50 MG tablet Commonly known as: Senokot-S Take 2 tablets by  mouth at bedtime.   thiamine 100 MG tablet Take 1 tablet (100 mg total) by mouth daily. Start taking on: May 27, 2020   traZODone 50 MG tablet Commonly known as: DESYREL Take 50 mg by mouth at bedtime as needed for sleep.      Allergies  Allergen Reactions  . Penicillins     Nervous   . Penicillins Anxiety and Other (See Comments)    DID THE REACTION INVOLVE: Swelling of the face/tongue/throat, SOB, or low BP? No Sudden or severe rash/hives, skin peeling, or the inside of the mouth or nose? No Did it require medical treatment? No When did it last happen?many years ago If all above answers are "NO", may proceed with cephalosporin use.       Follow-up Information    MD AT SNF Follow up.        Jackquline Denmark, MD. Schedule an appointment as soon as possible for a visit in 3 week(s).   Specialties: Gastroenterology, Internal Medicine Why: Follow-up in 3 to 4 weeks. Contact information: [place] [address] [phone]                The results of significant diagnostics from this hospitalization (including imaging, microbiology, ancillary and laboratory) are listed below for reference.    Significant Diagnostic Studies: No results found.  Microbiology: Recent Results (from the past 240 hour(s))  SARS CORONAVIRUS 2 (TAT 6-24 HRS) Nasopharyngeal Nasopharyngeal Swab     Status: None   Collection Time: 05/21/20  1:43 PM   Specimen: Nasopharyngeal Swab  Result Value Ref Range Status   SARS Coronavirus 2 NEGATIVE NEGATIVE Final    Comment: (NOTE) SARS-CoV-2 target nucleic acids are NOT DETECTED.  The SARS-CoV-2 RNA is generally detectable in upper and lower respiratory specimens during the acute phase of infection. Negative results do not preclude SARS-CoV-2 infection, do not rule out co-infections with other pathogens, and should not be used as the sole basis for treatment or other patient management  decisions. Negative results must be combined with clinical observations, patient history, and epidemiological information. The expected result is Negative.  Fact Sheet for Patients: SugarRoll.be  Fact Sheet for Healthcare Providers: https://www.woods-mathews.com/  This test is not yet approved or cleared by the [place] FDA and  has been authorized for detection and/or diagnosis of SARS-CoV-2 by FDA under an Emergency Use Authorization (EUA). This EUA will remain  in effect (meaning this test can be used) for the duration of the COVID-19 declaration under Se ction 564(b)(1) of the Act, 21 U.S.C. section 360bbb-3(b)(1), unless the authorization is terminated or revoked sooner.  Performed at [facility], [place] [address] [place], [place] [address]   SARS CORONAVIRUS 2 (TAT 6-24 HRS) Nasopharyngeal Nasopharyngeal Swab     Status: None   Collection Time: 05/25/20  5:25 PM   Specimen: Nasopharyngeal Swab  Result Value Ref Range Status   SARS Coronavirus 2 NEGATIVE NEGATIVE Final    Comment: (NOTE) SARS-CoV-2 target nucleic acids are NOT DETECTED.  The SARS-CoV-2 RNA is generally detectable in upper and lower respiratory specimens during the acute phase of infection. Negative results do not preclude SARS-CoV-2 infection, do not rule out co-infections with other pathogens, and should not be used as the sole basis for treatment or other patient management decisions. Negative results must be combined with clinical observations, patient history, and epidemiological information. The expected result is Negative.  Fact Sheet for Patients: SugarRoll.be  Fact Sheet for Healthcare Providers: https://www.woods-mathews.com/  This test is not yet approved or cleared by the [place] FDA and  has been authorized for detection and/or diagnosis of SARS-CoV-2 by FDA under an Emergency Use  Authorization (EUA). This EUA will remain  in effect (meaning this test can be used) for the duration of the COVID-19 declaration under Se ction 564(b)(1) of the Act, 21 U.S.C. section 360bbb-3(b)(1), unless the authorization is terminated or revoked sooner.  Performed at [facility], [place] [address] [place], [place] [address]      Labs: Basic Metabolic Panel: Recent Labs  Lab 05/22/20 0400 05/23/20 0315 05/24/20 0554 05/25/20 0615 05/26/20 0455  NA 143 145 143 137 136  K 3.9 3.9 3.6 4.1 3.9  CL 112* 115* 113* 108 107  CO2 20* 21* 22 22 21*  GLUCOSE 164* 208* 112* 95 108*  BUN 31* 23 17 19 20   CREATININE 1.68* 1.46* 1.31* 1.35* 1.35*  CALCIUM 8.2* 7.7* 8.2* 8.1* 7.9*  MG  --  1.8 1.5* 1.8 2.0  PHOS  --   --  2.7  --   --    Liver Function Tests: Recent Labs  Lab 05/22/20 0400 05/24/20 0554  AST 23  --   ALT 19  --   ALKPHOS 44  --   BILITOT 0.7  --   PROT 6.5  --   ALBUMIN 2.7* 2.6*   No results for input(s): LIPASE, AMYLASE in the last 168 hours. No results for input(s): AMMONIA in the last 168 hours. CBC: Recent Labs  Lab 05/21/20 1337 05/21/20 2132 05/22/20 0400 05/22/20 0832 05/23/20 1632 05/24/20 0554 05/24/20 1413 05/25/20 0615 05/26/20 0455  WBC 4.4  --  6.4  --   --  6.8  --  5.6  --   NEUTROABS 3.1  --   --   --   --  5.1  --   --   --   HGB 3.6*   < > 6.7*   < > 9.1* 9.7* 9.7* 9.3* 8.5*  HCT 12.4*   < > 20.9*   < > 28.7* 30.4* 30.3* 29.3* 26.5*  MCV 93.2  --  89.3  --   --  88.1  --  88.3  --   PLT 184  --  182  --   --  141*  --  124*  --    < > = values in this interval not displayed.   Cardiac Enzymes: No results for input(s): CKTOTAL, CKMB, CKMBINDEX, TROPONINI in the last 168 hours. BNP: BNP (last 3 results) No results for input(s): BNP in the last 8760 hours.  ProBNP (  last 3 results) No results for input(s): PROBNP in the last 8760 hours.  CBG: Recent Labs  Lab 05/25/20 1959 05/26/20 0004 05/26/20 0442  05/26/20 0812 05/26/20 1146  GLUCAP 182* 143* 107* 103* 190*       Signed:  Irine Seal MD.  [facility] 05/26/2020, 12:42 PM

## 2020-05-26 NOTE — Care Management Important Message (Signed)
Important Message  Patient Details IM Letter placed in Patient's door [facility]. Name: Gregory Nunez Gregory Nunez. MRN: [id number] Date of Birth: [birth year]   Medicare Important Message Given:  Yes     Kerin Salen 05/26/2020, 2:14 PM

## 2020-05-26 NOTE — Progress Notes (Addendum)
Report called Kela LPN to [facility] . Awaiting PTAR.  AVS package placed in discharge packet.  IV access removed.

## 2020-05-28 NOTE — Telephone Encounter (Signed)
[facility] [place] [facility] facility called to make appt for Capsule.  I was transferred twice and then the line just rings no voice mail. Will try again later.

## 2020-05-29 NOTE — Telephone Encounter (Signed)
Placed a second call to [facility] facility and was transferred to the nurse on the floor taking care of the pt.  I was on hold for several min and the line was disconnected.  I called back and was transferred again and the line was busy.  Spent a total of 15 min trying to reach the nurse.  Will try later.

## 2020-06-08 ENCOUNTER — Encounter: Payer: Self-pay | Admitting: Gastroenterology

## 2020-06-09 NOTE — Telephone Encounter (Signed)
Tried again to reach the pt and left another call for the facility nurse to call me back. I also tried alternate number for the pt in Epic and the line rings a fast busy.  Will try again later.

## 2020-06-10 NOTE — Telephone Encounter (Signed)
I have mailed a letter to the pt home address- I have no address for the nephew.

## 2020-06-10 NOTE — Telephone Encounter (Signed)
Maybe you can try sending a letter to the patient's address and his nephew's address.  Otherwise just save all of your documentation, that's all we can do.  Thank you for your help,  Jess

## 2020-06-10 NOTE — Telephone Encounter (Signed)
Gregory Nunez I have tried to reach the pt, his nephew and the nurse at [facility] to get him scheduled for capsule.  The pt home number rings a fast busy.  The nephew I have left several messages for him to call our office. When I call [facility] care I am transferred and no answers or I leave messages and no one calls back.  I have been trying for several weeks to get this scheduled.

## 2020-06-18 ENCOUNTER — Inpatient Hospital Stay: Payer: Medicare Other | Admitting: Adult Health

## 2020-06-18 ENCOUNTER — Encounter: Payer: Self-pay | Admitting: Adult Health

## 2020-06-18 NOTE — Progress Notes (Deleted)
[facility] [address] [place]. [place] [address] [phone]       HOSPITAL FOLLOW UP NOTE  [name] Date of Birth:  [birth year] Medical Record Number:  [id number]   Reason for Referral:  hospital stroke follow up    SUBJECTIVE:   CHIEF COMPLAINT:  No chief complaint on file.   HPI:   [name] is a 75 y.o. male with history of PMHx of prostate cancer, HTN, HLD, substance abuse, ? COPD, and DM II with retinopathy. Pt admits to 2 liquor drinks a day and no ETOH x 3 days.  Patient is brought in to the ED via EMS on 04/23/2020 for being found walking around the parking lot confused. EMS reported right facial droop and confusion to orientation questions.  Personally reviewed hospitalization pertinent progress notes, lab work and imaging with summary provided.  Evaluated by Dr. Leonie Nunez with stroke work-up revealing large left basal ganglia infarct, likely embolic given size secondary to unknown source.  Recommended placement of a loop recorder for possible atrial fibrillation although patient declined during admission.  Recommended DAPT for 3 weeks and aspirin alone.  History of HTN with long-term BP goal normotensive range.  LDL 82 and initiate atorvastatin 40 mg daily.  Controlled DM with A1c 6.5.  Other stroke risk factors include advanced age, current tobacco use, EtOH use and CAD.  Hospital course significant for concern of EtOH withdrawal receiving Librium and Ativan as needed per CIWA protocol.  Cognitive impairment felt likely secondary to chronic alcohol abuse and discharged on thiamine, folic acid and multivitamin.  Evaluated by therapies and recommended discharge to SNF as not a candidate for CIR due to lack of 24-hour care at home.    Stroke - large L basal ganglia infarct and R periatrial white matter infarct, likely embolic given size of BG infarct secondary to unknown source  Head CT new hypodensity in left paramedian pons  consistent with acute infarction and ill-defined hypodensity left lenticular nuclear suspicious for subacute infarct  MRI 3cm acute ischemic nonhemorrhagic infarct involving the left basal cannula, additional 5 mm subacute ischemic nonhemorrhagic right periatrial white matter infarct  MRA head: Negative LVO, short segment moderate stenosis mid basilar artery  MRA neck: Wide patency of both carotid arteries, single short segment moderate stenosis involving the preforaminal left V1 segment, vertebral arteries otherwise widely patent within the neck, left vertebral artery dominant  2D Echo EF 60 to 65% without cardiac source of embolism identified  LDL  82  HgbA1c  6.5  Antithrombotic regimen: On no regimen PTA. Now on aspirin and Plavix for 3 weeks and aspirin alone  Therapy recommendations: CIR vs SNF - lack of 24-hour care at home  Disposition:  SNF [facility] 05/08/2020   Today, 06/18/2020, [name] is being seen for hospital stroke follow-up.    Recent hospitalization 05/21/2020 for upper GI bleed with multiple dark stools and lab work showing severe anemia.  Transfused total of 5 units RBCs.  GI bleed likely in setting of gastroduodenal AVMs s/p APC and minimal gastritis noted on EGD and polyps s/p resection with nonbleeding internal hemorrhoids on colonoscopy.  Anemia panel consistent with iron deficiency anemia.  He has remained on DAPT past 3-week recommendation (should have been completed on 1/28) therefore Plavix discontinued and aspirin 81 mg daily resumed.  He was discharged back to SNF in stable condition.      ROS:   14 system review of systems performed  and negative with exception of ***  PMH:  Past Medical History:  Diagnosis Date  . Cancer [facility])    prostate  . Cataract    OD  . Depression   . Diabetes mellitus without complication ([place])   . Diabetic retinopathy ([place])    NPDR OU  . GERD (gastroesophageal reflux disease)    if drinks alcohol  . 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 Hyperlipidemia   . Hypertension   . Hypertensive retinopathy    OU  . Malignant neoplasm of prostate ([place]) 01/09/2014  . Pancreatitis   . Prostate cancer ([place]) 12/19/13   Gleason 4+3=7, volume 31.31 cc  . Shortness of breath dyspnea    with exertion     PSH:  Past Surgical History:  Procedure Laterality Date  . BIOPSY  04/16/2018   Procedure: BIOPSY;  Surgeon: Gregory Craver, MD;  Location: [facility];  Service: Endoscopy;;  . BIOPSY  05/23/2020   Procedure: BIOPSY;  Surgeon: Gregory Denmark, MD;  Location: [facility];  Service: Gastroenterology;;  EGD and COLON  . biopsy on throat     hx of   . CATARACT EXTRACTION Bilateral    Dr. Quentin Nunez  . COLONOSCOPY N/A 05/23/2020   Procedure: COLONOSCOPY;  Surgeon: Gregory Denmark, MD;  Location: [facility];  Service: Gastroenterology;  Laterality: N/A;  . ESOPHAGOGASTRODUODENOSCOPY Left 04/16/2018   Procedure: ESOPHAGOGASTRODUODENOSCOPY (EGD);  Surgeon: Gregory Craver, MD;  Location: [facility];  Service: Endoscopy;  Laterality: Left;  . ESOPHAGOGASTRODUODENOSCOPY (EGD) WITH PROPOFOL N/A 05/23/2020   Procedure: ESOPHAGOGASTRODUODENOSCOPY (EGD) WITH PROPOFOL;  Surgeon: Gregory Denmark, MD;  Location: [facility];  Service: Gastroenterology;  Laterality: N/A;  . EYE SURGERY    . FOOT SURGERY    . HOT HEMOSTASIS N/A 04/16/2018   Procedure: HOT HEMOSTASIS (ARGON PLASMA COAGULATION/BICAP);  Surgeon: Gregory Craver, MD;  Location: [facility];  Service: Endoscopy;  Laterality: N/A;  . HOT HEMOSTASIS N/A 05/23/2020   Procedure: HOT HEMOSTASIS (ARGON PLASMA COAGULATION/BICAP);  Surgeon: Gregory Denmark, MD;  Location: [facility];  Service: Gastroenterology;  Laterality: N/A;  . LYMPHADENECTOMY Bilateral 02/27/2014   Procedure: BILATERAL LYMPHADENECTOMY;  Surgeon: Gregory Frock, MD;  Location: [facility];  Service: Urology;  Laterality: Bilateral;   . POLYPECTOMY  05/23/2020   Procedure: POLYPECTOMY;  Surgeon: Gregory Denmark, MD;  Location: [facility];  Service: Gastroenterology;;  . PROSTATE BIOPSY  12/2013   Gleason 4+3=7, volume 31.31 cc  . ROBOT ASSISTED LAPAROSCOPIC RADICAL PROSTATECTOMY N/A 02/27/2014   Procedure: ROBOTIC ASSISTED LAPAROSCOPIC RADICAL PROSTATECTOMY WITH INDOCYANINE GREEN DYE;  Surgeon: Gregory Frock, MD;  Location: [facility];  Service: Urology;  Laterality: N/A;    Social History:  Social History   Socioeconomic History  . Marital status: Married    Spouse name: Not on file  . Number of children: Not on file  . Years of education: Not on file  . Highest education level: Not on file  Occupational History  . Not on file  Tobacco Use  . Smoking status: Current Every Day Smoker    Packs/day: 0.50  . Smokeless tobacco: Never Used  . Tobacco comment: smokes a couple every day or two  Vaping Use  . Vaping Use: Never used  Substance and Sexual Activity  . Alcohol use: Not Currently    Comment: Patient states no alcohol in 2 years.   . Drug use: Not Currently    Types: Marijuana, Cocaine  Comment: past hx approx 30 years ago   . Sexual activity: Not on file  Other Topics Concern  . Not on file  Social History Narrative   ** Merged History Encounter **       Social Determinants of Health   Financial Resource Strain: Not on file  Food Insecurity: Not on file  Transportation Needs: Not on file  Physical Activity: Not on file  Stress: Not on file  Social Connections: Not on file  Intimate Partner Violence: Not on file    Family History:  Family History  Problem Relation Age of Onset  . Heart disease Mother   . Cancer Sister        breast  . Colon cancer Neg Hx   . Esophageal cancer Neg Hx   . Rectal cancer Neg Hx   . Stomach cancer Neg Hx     Medications:   Current Outpatient Medications on File Prior to Visit  Medication Sig Dispense Refill  . albuterol (VENTOLIN HFA) 108 (90 Base)  MCG/ACT inhaler Inhale 2 puffs into the lungs every 6 (six) hours as needed for wheezing or shortness of breath.    Marland Kitchen amLODipine (NORVASC) 10 MG tablet Take 10 mg by mouth daily.    Marland Kitchen aspirin EC 81 MG EC tablet Take 1 tablet (81 mg total) by mouth daily. Swallow whole. 90 tablet 0  . atorvastatin (LIPITOR) 40 MG tablet Take 1 tablet (40 mg total) by mouth daily. (Patient taking differently: Take 40 mg by mouth at bedtime.) 90 tablet 0  . carvedilol (COREG) 25 MG tablet Take 1 tablet (25 mg total) by mouth 2 (two) times daily with a meal. (Patient taking differently: Take 25 mg by mouth 2 (two) times daily with a meal. Hold if SBP<100, HR<60) 60 tablet 0  . Ensure Plus (ENSURE PLUS) LIQD Take 237 mLs by mouth 2 (two) times daily between meals.    . folic acid (FOLVITE) 1 MG tablet Take 1 tablet (1 mg total) by mouth daily.    . iron polysaccharides (NU-IRON) 150 MG capsule Take 1 capsule (150 mg total) by mouth daily.    . metFORMIN (GLUCOPHAGE) 500 MG tablet Take 500 mg by mouth 2 (two) times daily.    . Multiple Vitamin (MULTIVITAMIN WITH MINERALS) TABS tablet Take 1 tablet by mouth daily.    . nicotine (NICODERM CQ - DOSED IN MG/24 HOURS) 21 mg/24hr patch Place 1 patch (21 mg total) onto the skin daily. 28 patch 0  . pantoprazole (PROTONIX) 40 MG tablet Take 40 mg by mouth daily.    . polyethylene glycol (MIRALAX / GLYCOLAX) 17 g packet Take 17 g by mouth daily. 14 each 0  . senna-docusate (SENOKOT-S) 8.6-50 MG tablet Take 2 tablets by mouth at bedtime.    . thiamine 100 MG tablet Take 1 tablet (100 mg total) by mouth daily.    . traZODone (DESYREL) 50 MG tablet Take 50 mg by mouth at bedtime as needed for sleep.    . [DISCONTINUED] COMBIVENT RESPIMAT 20-100 MCG/ACT AERS respimat Inhale 1 puff into the lungs every 6 (six) hours as needed for shortness of breath.    . [DISCONTINUED] ferrous sulfate 325 (65 FE) MG tablet Take 1 tablet (325 mg total) by mouth 2 (two) times daily with a meal. (Patient  not taking: Reported on 08/19/2019) 30 tablet 3  . [DISCONTINUED] mometasone-formoterol (DULERA) 100-5 MCG/ACT AERO Inhale 2 puffs into the lungs daily. (Patient not taking: Reported on 08/19/2019) 13 g  1  . [DISCONTINUED] sucralfate (CARAFATE) 1 g tablet Take 1 tablet (1 g total) by mouth 4 (four) times daily. (Patient not taking: Reported on 08/19/2019) 120 tablet 1  . [DISCONTINUED] tiotropium (SPIRIVA HANDIHALER) 18 MCG inhalation capsule Place 1 capsule (18 mcg total) into inhaler and inhale daily. (Patient not taking: Reported on 08/19/2019) 30 capsule 2   No current facility-administered medications on file prior to visit.    Allergies:   Allergies  Allergen Reactions  . Penicillins     Nervous   . Penicillins Anxiety and Other (See Comments)    DID THE REACTION INVOLVE: Swelling of the face/tongue/throat, SOB, or low BP? No Sudden or severe rash/hives, skin peeling, or the inside of the mouth or nose? No Did it require medical treatment? No When did it last happen?many years ago If all above answers are "NO", may proceed with cephalosporin use.         OBJECTIVE:  Physical Exam  There were no vitals filed for this visit. There is no height or weight on file to calculate BMI. No exam data present  No flowsheet data found.   General: well developed, well nourished, seated, in no evident distress Head: head normocephalic and atraumatic.   Neck: supple with no carotid or supraclavicular bruits Cardiovascular: regular rate and rhythm, no murmurs Musculoskeletal: no deformity Skin:  no rash/petichiae Vascular:  Normal pulses all extremities   Neurologic Exam Mental Status: Awake and fully alert. Oriented to place and time. Recent and remote memory intact. Attention span, concentration and fund of knowledge appropriate. Mood and affect appropriate.  Cranial Nerves: Fundoscopic exam reveals sharp disc margins. Pupils equal, briskly reactive to light. Extraocular movements  full without nystagmus. Visual fields full to confrontation. Hearing intact. Facial sensation intact. Face, tongue, palate moves normally and symmetrically.  Motor: Normal bulk and tone. Normal strength in all tested extremity muscles Sensory.: intact to touch , pinprick , position and vibratory sensation.  Coordination: Rapid alternating movements normal in all extremities. Finger-to-nose and heel-to-shin performed accurately bilaterally. Gait and Station: Arises from chair without difficulty. Stance is normal. Gait demonstrates normal stride length and balance with ***. Tandem walk and heel toe ***.  Reflexes: 1+ and symmetric. Toes downgoing.     NIHSS  *** Modified Rankin  ***      ASSESSMENT: Gregory Nunez. is a 75 y.o. year old male presented via EMS on 04/23/2020 after he was found walking around the parking lot confused with EMS noting right facial droop and confusion to orientation questions with stroke work-up revealing large left basal ganglia infarct, likely embolic given size secondary to unknown source. Vascular risk factors include HTN, HLD, DM, tobacco use, substance abuse.      PLAN:  1. *** : Residual deficit: ***. Continue {anticoagulants:31417}  and ***  for secondary stroke prevention.  Discussed secondary stroke prevention measures and importance of close PCP follow up for aggressive stroke risk factor management  2. HTN: BP goal <130/90.  Stable on *** per PCP 3. HLD: LDL goal <70. Recent LDL ***.  4. DMII: A1c goal<7.0. Recent A1c ***.     Follow up in *** or call earlier if needed   CC:  [facility] provider: Dr. Leonie Nunez PCP: [name], MD    I spent *** minutes of face-to-face and non-face-to-face time with patient.  This included previsit chart review, lab review, study review, order entry, electronic health record documentation, patient education regarding recent stroke, residual deficits, importance of managing  stroke risk factors and answered all  questions to patient satisfaction     Frann Rider, [facility]  [facility] [address] [place], [place] [address]  Phone [phone] Fax [phone] Note: This document was prepared with digital dictation and possible smart phrase technology. Any transcriptional errors that result from this process are unintentional.

## 2020-08-28 ENCOUNTER — Other Ambulatory Visit: Payer: Self-pay | Admitting: Internal Medicine

## 2020-08-29 LAB — COMPLETE METABOLIC PANEL WITH GFR
AG Ratio: 0.9 (calc) — ABNORMAL LOW (ref 1.0–2.5)
ALT: 29 U/L (ref 9–46)
AST: 31 U/L (ref 10–35)
Albumin: 3.5 g/dL — ABNORMAL LOW (ref 3.6–5.1)
Alkaline phosphatase (APISO): 61 U/L (ref 35–144)
BUN/Creatinine Ratio: 16 (calc) (ref 6–22)
BUN: 24 mg/dL (ref 7–25)
CO2: 21 mmol/L (ref 20–32)
Calcium: 8.7 mg/dL (ref 8.6–10.3)
Chloride: 107 mmol/L (ref 98–110)
Creat: 1.52 mg/dL — ABNORMAL HIGH (ref 0.70–1.18)
GFR, Est African American: 52 mL/min/{1.73_m2} — ABNORMAL LOW (ref >=60)
GFR, Est Non African American: 45 mL/min/{1.73_m2} — ABNORMAL LOW (ref >=60)
Globulin: 4.1 g/dL (calc) — ABNORMAL HIGH (ref 1.9–3.7)
Glucose, Bld: 209 mg/dL — ABNORMAL HIGH (ref 65–99)
Potassium: 4.2 mmol/L (ref 3.5–5.3)
Sodium: 138 mmol/L (ref 135–146)
Total Bilirubin: 0.2 mg/dL (ref 0.2–1.2)
Total Protein: 7.6 g/dL (ref 6.1–8.1)

## 2020-08-29 LAB — LIPID PANEL
Cholesterol: 136 mg/dL (ref <200)
HDL: 58 mg/dL (ref >=40)
LDL Cholesterol (Calc): 61 mg/dL (calc)
Non-HDL Cholesterol (Calc): 78 mg/dL (calc) (ref <130)
Total CHOL/HDL Ratio: 2.3 (calc) (ref <5.0)
Triglycerides: 90 mg/dL (ref <150)

## 2020-08-29 LAB — CBC
HCT: 25.8 % — ABNORMAL LOW (ref 38.5–50.0)
Hemoglobin: 8.3 g/dL — ABNORMAL LOW (ref 13.2–17.1)
MCH: 30 pg (ref 27.0–33.0)
MCHC: 32.2 g/dL (ref 32.0–36.0)
MCV: 93.1 fL (ref 80.0–100.0)
MPV: 12.4 fL (ref 7.5–12.5)
Platelets: 181 10*3/uL (ref 140–400)
RBC: 2.77 10*6/uL — ABNORMAL LOW (ref 4.20–5.80)
RDW: 14.2 % (ref 11.0–15.0)
WBC: 3.5 10*3/uL — ABNORMAL LOW (ref 3.8–10.8)

## 2020-08-29 LAB — TSH: TSH: 2.31 mIU/L (ref 0.40–4.50)

## 2020-09-13 ENCOUNTER — Encounter (HOSPITAL_COMMUNITY): Payer: Self-pay

## 2020-09-13 ENCOUNTER — Emergency Department (HOSPITAL_COMMUNITY)
Admission: EM | Admit: 2020-09-13 | Discharge: 2020-09-14 | Disposition: A | Discharge status: Home / Self Care | Payer: Medicare Other | Attending: Emergency Medicine | Admitting: Emergency Medicine

## 2020-09-13 ENCOUNTER — Emergency Department (HOSPITAL_COMMUNITY): Payer: Medicare Other

## 2020-09-13 DIAGNOSIS — F17200 Nicotine dependence, unspecified, uncomplicated: Secondary | ICD-10-CM | POA: Diagnosis not present

## 2020-09-13 DIAGNOSIS — Z7984 Long term (current) use of oral hypoglycemic drugs: Secondary | ICD-10-CM | POA: Insufficient documentation

## 2020-09-13 DIAGNOSIS — I10 Essential (primary) hypertension: Secondary | ICD-10-CM | POA: Diagnosis not present

## 2020-09-13 DIAGNOSIS — E162 Hypoglycemia, unspecified: Secondary | ICD-10-CM | POA: Diagnosis not present

## 2020-09-13 DIAGNOSIS — E119 Type 2 diabetes mellitus without complications: Secondary | ICD-10-CM | POA: Diagnosis not present

## 2020-09-13 DIAGNOSIS — Z8546 Personal history of malignant neoplasm of prostate: Secondary | ICD-10-CM | POA: Diagnosis not present

## 2020-09-13 DIAGNOSIS — R410 Disorientation, unspecified: Secondary | ICD-10-CM

## 2020-09-13 DIAGNOSIS — R4182 Altered mental status, unspecified: Secondary | ICD-10-CM | POA: Diagnosis not present

## 2020-09-13 DIAGNOSIS — R4701 Aphasia: Secondary | ICD-10-CM | POA: Insufficient documentation

## 2020-09-13 DIAGNOSIS — Z79899 Other long term (current) drug therapy: Secondary | ICD-10-CM | POA: Insufficient documentation

## 2020-09-13 LAB — I-STAT CHEM 8, ED
BUN: 20 mg/dL (ref 8–23)
Calcium, Ion: 1.15 mmol/L (ref 1.15–1.40)
Chloride: 111 mmol/L (ref 98–111)
Creatinine, Ser: 1.2 mg/dL (ref 0.61–1.24)
Glucose, Bld: 84 mg/dL (ref 70–99)
HCT: 23 % — ABNORMAL LOW (ref 39.0–52.0)
Hemoglobin: 7.8 g/dL — ABNORMAL LOW (ref 13.0–17.0)
Potassium: 3.9 mmol/L (ref 3.5–5.1)
Sodium: 143 mmol/L (ref 135–145)
TCO2: 23 mmol/L (ref 22–32)

## 2020-09-13 LAB — DIFFERENTIAL
Abs Immature Granulocytes: 0.02 10*3/uL (ref 0.00–0.07)
Basophils Absolute: 0 10*3/uL (ref 0.0–0.1)
Basophils Relative: 0 %
Eosinophils Absolute: 0.1 10*3/uL (ref 0.0–0.5)
Eosinophils Relative: 1 %
Immature Granulocytes: 0 %
Lymphocytes Relative: 21 %
Lymphs Abs: 0.9 10*3/uL (ref 0.7–4.0)
Monocytes Absolute: 0.4 10*3/uL (ref 0.1–1.0)
Monocytes Relative: 8 %
Neutro Abs: 3.1 10*3/uL (ref 1.7–7.7)
Neutrophils Relative %: 70 %

## 2020-09-13 LAB — RAPID URINE DRUG SCREEN, HOSP PERFORMED
Amphetamines: NOT DETECTED
Barbiturates: NOT DETECTED
Benzodiazepines: NOT DETECTED
Cocaine: NOT DETECTED
Opiates: NOT DETECTED
Tetrahydrocannabinol: NOT DETECTED

## 2020-09-13 LAB — URINALYSIS, ROUTINE W REFLEX MICROSCOPIC
Bacteria, UA: NONE SEEN
Bilirubin Urine: NEGATIVE
Glucose, UA: NEGATIVE mg/dL
Ketones, ur: NEGATIVE mg/dL
Leukocytes,Ua: NEGATIVE
Nitrite: NEGATIVE
Protein, ur: >=300 mg/dL — AB
Specific Gravity, Urine: 1.007 (ref 1.005–1.030)
pH: 6 (ref 5.0–8.0)

## 2020-09-13 LAB — CBC
HCT: 24.9 % — ABNORMAL LOW (ref 39.0–52.0)
Hemoglobin: 7.9 g/dL — ABNORMAL LOW (ref 13.0–17.0)
MCH: 29 pg (ref 26.0–34.0)
MCHC: 31.7 g/dL (ref 30.0–36.0)
MCV: 91.5 fL (ref 80.0–100.0)
Platelets: 148 10*3/uL — ABNORMAL LOW (ref 150–400)
RBC: 2.72 MIL/uL — ABNORMAL LOW (ref 4.22–5.81)
RDW: 14.6 % (ref 11.5–15.5)
WBC: 4.5 10*3/uL (ref 4.0–10.5)
nRBC: 0 % (ref 0.0–0.2)

## 2020-09-13 LAB — COMPREHENSIVE METABOLIC PANEL
ALT: 25 U/L (ref 0–44)
AST: 30 U/L (ref 15–41)
Albumin: 2.7 g/dL — ABNORMAL LOW (ref 3.5–5.0)
Alkaline Phosphatase: 52 U/L (ref 38–126)
Anion gap: 5 (ref 5–15)
BUN: 18 mg/dL (ref 8–23)
CO2: 23 mmol/L (ref 22–32)
Calcium: 8.6 mg/dL — ABNORMAL LOW (ref 8.9–10.3)
Chloride: 113 mmol/L — ABNORMAL HIGH (ref 98–111)
Creatinine, Ser: 1.29 mg/dL — ABNORMAL HIGH (ref 0.61–1.24)
GFR, Estimated: 59 mL/min — ABNORMAL LOW (ref >60)
Glucose, Bld: 91 mg/dL (ref 70–99)
Potassium: 3.8 mmol/L (ref 3.5–5.1)
Sodium: 141 mmol/L (ref 135–145)
Total Bilirubin: 0.4 mg/dL (ref 0.3–1.2)
Total Protein: 7.1 g/dL (ref 6.5–8.1)

## 2020-09-13 LAB — CBG MONITORING, ED
Glucose-Capillary: 101 mg/dL — ABNORMAL HIGH (ref 70–99)
Glucose-Capillary: 116 mg/dL — ABNORMAL HIGH (ref 70–99)
Glucose-Capillary: 118 mg/dL — ABNORMAL HIGH (ref 70–99)
Glucose-Capillary: 155 mg/dL — ABNORMAL HIGH (ref 70–99)
Glucose-Capillary: 43 mg/dL — CL (ref 70–99)

## 2020-09-13 LAB — PROTIME-INR
INR: 1 (ref 0.8–1.2)
Prothrombin Time: 12.7 seconds (ref 11.4–15.2)

## 2020-09-13 LAB — APTT: aPTT: 33 seconds (ref 24–36)

## 2020-09-13 LAB — ETHANOL: Alcohol, Ethyl (B): <10 mg/dL (ref <10)

## 2020-09-13 NOTE — ED Provider Notes (Signed)
[facility] EMERGENCY DEPARTMENT Provider Note   CSN: [id number] Arrival date & time: 09/13/20  1658     History Chief Complaint  Patient presents with  . Aphasia    Gregory Leiner Gabryel Files. is a 75 y.o. male.  HPI      Level 5 caveat due to altered mental status.  Gregory Simson Jamall Strohmeier. is a 75 y.o. male, with a history of prostate cancer, DM, GERD, HTN, CVA, presenting to the ED with altered mental status with unknown start time.  Patient reported to be found altered by family on scene.  Hypoglycemic.  Per EMS, patient's family seemed to be intoxicated and were interfering with their efforts to treat the patient. Patient initially was unable to articulate why he came to the emergency department.    Past Medical History:  Diagnosis Date  . Cancer [facility])    prostate  . Cataract    OD  . Depression   . Diabetes mellitus without complication ([place])   . Diabetic retinopathy ([place])    NPDR OU  . GERD (gastroesophageal reflux disease)    if drinks alcohol  . 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 Hyperlipidemia   . Hypertension   . Hypertensive retinopathy    OU  . Malignant neoplasm of prostate ([place]) 01/09/2014  . Pancreatitis   . Prostate cancer ([place]) 12/19/13   Gleason 4+3=7, volume 31.31 cc  . Shortness of breath dyspnea    with exertion     Patient Active Problem List   Diagnosis Date Noted  . Melena   . Upper GI bleed   . Iron deficiency anemia 05/22/2020  . Constipation 05/22/2020  . GIB (gastrointestinal bleeding) 05/21/2020  . Tobacco abuse   . 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 ([place])   . Acute CVA (cerebrovascular accident) ([place]) 04/23/2020  . Hyperlipidemia associated with type 2 diabetes mellitus ([place]) 04/23/2020  . Alcohol use 04/23/2020  . AKI (acute kidney injury) ([place]) 04/23/2020  . Tobacco use 04/23/2020  . Unresponsive  11/08/2018  . Syncope, convulsive 11/08/2018  . Hypertension associated with diabetes ([place]) 11/08/2018  . Alcohol withdrawal seizure with complication, with unspecified complication ([place]) 62/56/3893  . Type 2 diabetes mellitus without complication ([place]) 73/42/8768  . Symptomatic anemia 04/15/2018  . Prostate cancer ([place]) 02/27/2014  . Malignant neoplasm of prostate ([place]) 01/09/2014  . Bunion 01/17/2013  . 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 BIOPSY  04/16/2018   Procedure: BIOPSY;  Surgeon: Juanita Craver, MD;  Location: [facility] ENDOSCOPY;  Service: Endoscopy;;  . BIOPSY  05/23/2020   Procedure: BIOPSY;  Surgeon: Jackquline Denmark, MD;  Location: [facility];  Service: Gastroenterology;;  EGD and COLON  . biopsy on throat     hx of   . CATARACT EXTRACTION Bilateral    Dr. Quentin Ore  . COLONOSCOPY N/A 05/23/2020   Procedure: COLONOSCOPY;  Surgeon: Jackquline Denmark, MD;  Location: [facility];  Service: Gastroenterology;  Laterality: N/A;  . ESOPHAGOGASTRODUODENOSCOPY Left 04/16/2018   Procedure: ESOPHAGOGASTRODUODENOSCOPY (EGD);  Surgeon: Juanita Craver, MD;  Location: [facility];  Service: Endoscopy;  Laterality: Left;  . ESOPHAGOGASTRODUODENOSCOPY (EGD) WITH PROPOFOL N/A 05/23/2020   Procedure: ESOPHAGOGASTRODUODENOSCOPY (EGD) WITH PROPOFOL;  Surgeon: Jackquline Denmark, MD;  Location: [facility];  Service: Gastroenterology;  Laterality: N/A;  . EYE SURGERY    . FOOT SURGERY    .  HOT HEMOSTASIS N/A 04/16/2018   Procedure: HOT HEMOSTASIS (ARGON PLASMA COAGULATION/BICAP);  Surgeon: Juanita Craver, MD;  Location: [facility];  Service: Endoscopy;  Laterality: N/A;  . HOT HEMOSTASIS N/A 05/23/2020   Procedure: HOT HEMOSTASIS (ARGON PLASMA COAGULATION/BICAP);  Surgeon: Jackquline Denmark, MD;  Location: [facility];  Service: Gastroenterology;  Laterality: N/A;  . LYMPHADENECTOMY Bilateral 02/27/2014   Procedure: BILATERAL LYMPHADENECTOMY;  Surgeon:  Alexis Frock, MD;  Location: [facility];  Service: Urology;  Laterality: Bilateral;  . POLYPECTOMY  05/23/2020   Procedure: POLYPECTOMY;  Surgeon: Jackquline Denmark, MD;  Location: [facility];  Service: Gastroenterology;;  . PROSTATE BIOPSY  12/2013   Gleason 4+3=7, volume 31.31 cc  . ROBOT ASSISTED LAPAROSCOPIC RADICAL PROSTATECTOMY N/A 02/27/2014   Procedure: ROBOTIC ASSISTED LAPAROSCOPIC RADICAL PROSTATECTOMY WITH INDOCYANINE GREEN DYE;  Surgeon: Alexis Frock, MD;  Location: [facility];  Service: Urology;  Laterality: N/A;       Family History  Problem Relation Age of Onset  . Heart disease Mother   . Cancer Sister        breast  . Colon cancer Neg Hx   . Esophageal cancer Neg Hx   . Rectal cancer Neg Hx   . Stomach cancer Neg Hx     Social History   Tobacco Use  . Smoking status: Current Every Day Smoker    Packs/day: 0.50  . Smokeless tobacco: Never Used  . Tobacco comment: smokes a couple every day or two  Vaping Use  . Vaping Use: Never used  Substance Use Topics  . Alcohol use: Not Currently    Alcohol/week: 1.0 standard drink    Types: 1 Cans of beer per week  . Drug use: Not Currently    Types: Marijuana, Cocaine    Comment: past hx approx 30 years ago     Home Medications Prior to Admission medications   Medication Sig Start Date End Date Taking? Authorizing Provider  albuterol (VENTOLIN HFA) 108 (90 Base) MCG/ACT inhaler Inhale 2 puffs into the lungs every 6 (six) hours as needed for wheezing or shortness of breath. 08/21/19   [provider]  amLODipine (NORVASC) 10 MG tablet Take 10 mg by mouth daily. 09/30/19   [provider]  atorvastatin (LIPITOR) 40 MG tablet Take 1 tablet (40 mg total) by mouth daily. Patient taking differently: Take 40 mg by mouth at bedtime. 04/25/20 07/24/20  [place] Donnamarie Poag, DO  carvedilol (COREG) 25 MG tablet Take 1 tablet (25 mg total) by mouth 2 (two) times daily with a meal. Patient taking differently: Take 25 mg by mouth  2 (two) times daily with a meal. Hold if SBP<100, HR<60 05/08/20   [name], Rinka R, MD  Ensure Plus (ENSURE PLUS) LIQD Take 237 mLs by mouth 2 (two) times daily between meals.    [provider]  folic acid (FOLVITE) 1 MG tablet Take 1 tablet (1 mg total) by mouth daily. 05/27/20   Eugenie Filler, MD  iron polysaccharides (NU-IRON) 150 MG capsule Take 1 capsule (150 mg total) by mouth daily. 05/26/20   Eugenie Filler, MD  metFORMIN (GLUCOPHAGE) 500 MG tablet Take 500 mg by mouth 2 (two) times daily. 02/13/19   [provider]  Multiple Vitamin (MULTIVITAMIN WITH MINERALS) TABS tablet Take 1 tablet by mouth daily. 05/27/20   Eugenie Filler, MD  nicotine (NICODERM CQ - DOSED IN MG/24 HOURS) 21 mg/24hr patch Place 1 patch (21 mg total) onto the skin daily. 05/09/20   [name], Michell Heinrich,  MD  pantoprazole (PROTONIX) 40 MG tablet Take 40 mg by mouth daily.    [provider]  polyethylene glycol (MIRALAX / GLYCOLAX) 17 g packet Take 17 g by mouth daily. 05/09/20   [name], Michell Heinrich, MD  senna-docusate (SENOKOT-S) 8.6-50 MG tablet Take 2 tablets by mouth at bedtime. 05/26/20   Eugenie Filler, MD  thiamine 100 MG tablet Take 1 tablet (100 mg total) by mouth daily. 05/27/20   Eugenie Filler, MD  traZODone (DESYREL) 50 MG tablet Take 50 mg by mouth at bedtime as needed for sleep.    [provider]  COMBIVENT RESPIMAT 20-100 MCG/ACT AERS respimat Inhale 1 puff into the lungs every 6 (six) hours as needed for shortness of breath. 06/12/18 08/19/19  [provider]  ferrous sulfate 325 (65 FE) MG tablet Take 1 tablet (325 mg total) by mouth 2 (two) times daily with a meal. Patient not taking: Reported on 08/19/2019 04/17/18 08/19/19  Dana Allan I, MD  mometasone-formoterol (DULERA) 100-5 MCG/ACT AERO Inhale 2 puffs into the lungs daily. Patient not taking: Reported on 08/19/2019 04/17/18 08/19/19  Dana Allan I, MD  sucralfate (CARAFATE) 1 g tablet Take 1  tablet (1 g total) by mouth 4 (four) times daily. Patient not taking: Reported on 08/19/2019 04/17/18 08/19/19  Dana Allan I, MD  tiotropium (SPIRIVA HANDIHALER) 18 MCG inhalation capsule Place 1 capsule (18 mcg total) into inhaler and inhale daily. Patient not taking: Reported on 08/19/2019 04/17/18 08/19/19  Dana Allan I, MD    Allergies    Penicillins and Penicillins  Review of Systems   Review of Systems  Unable to perform ROS: Mental status change    Physical Exam Updated Vital Signs BP (!) 179/88   Pulse 84   Temp 97.7 F (36.5 C) (Oral)   Resp 17   Ht 5\' 11"  (1.803 m)   Wt 82.6 kg   SpO2 100%   BMI 25.38 kg/m   Physical Exam Vitals and nursing note reviewed.  Constitutional:      General: He is not in acute distress.    Appearance: He is well-developed. He is not diaphoretic.  HENT:     Head: Normocephalic and atraumatic.     Mouth/Throat:     Mouth: Mucous membranes are moist.     Pharynx: Oropharynx is clear.  Eyes:     Conjunctiva/sclera: Conjunctivae normal.  Cardiovascular:     Rate and Rhythm: Normal rate and regular rhythm.     Pulses: Normal pulses.          Radial pulses are 2+ on the right side and 2+ on the left side.       Posterior tibial pulses are 2+ on the right side and 2+ on the left side.     Heart sounds: Normal heart sounds.     Comments: Tactile temperature in the extremities appropriate and equal bilaterally. Pulmonary:     Effort: Pulmonary effort is normal. No respiratory distress.     Breath sounds: Normal breath sounds.  Abdominal:     Palpations: Abdomen is soft.     Tenderness: There is no abdominal tenderness. There is no guarding.  Musculoskeletal:     Cervical back: Neck supple.     Right lower leg: No edema.     Left lower leg: No edema.  Lymphadenopathy:     Cervical: No cervical adenopathy.  Skin:    General: Skin is warm and dry.  Neurological:     Mental  Status: He is alert.     Comments: Initially,  patient could tell me his name and his current location, however, he had difficulty with date and the reason for his visit to the ED. Sensation grossly intact to light touch in the extremities.   Grip strengths equal bilaterally.   Strength 5/5 in all extremities.  No gait disturbance.  Coordination intact.  Cranial nerves III-XII grossly intact.  Handles oral secretions without noted difficulty.  Possible slurred speech. No facial droop.   Psychiatric:        Mood and Affect: Mood and affect normal.        Speech: Speech normal.        Behavior: Behavior normal.     ED Results / Procedures / Treatments   Labs (all labs ordered are listed, but only abnormal results are displayed) Labs Reviewed  CBC - Abnormal; Notable for the following components:      Result Value   RBC 2.72 (*)    Hemoglobin 7.9 (*)    HCT 24.9 (*)    Platelets 148 (*)    All other components within normal limits  COMPREHENSIVE METABOLIC PANEL - Abnormal; Notable for the following components:   Chloride 113 (*)    Creatinine, Ser 1.29 (*)    Calcium 8.6 (*)    Albumin 2.7 (*)    GFR, Estimated 59 (*)    All other components within normal limits  URINALYSIS, ROUTINE W REFLEX MICROSCOPIC - Abnormal; Notable for the following components:   Color, Urine STRAW (*)    Hgb urine dipstick SMALL (*)    Protein, ur >=300 (*)    All other components within normal limits  CBG MONITORING, ED - Abnormal; Notable for the following components:   Glucose-Capillary 118 (*)    All other components within normal limits  I-STAT CHEM 8, ED - Abnormal; Notable for the following components:   Hemoglobin 7.8 (*)    HCT 23.0 (*)    All other components within normal limits  CBG MONITORING, ED - Abnormal; Notable for the following components:   Glucose-Capillary 43 (*)    All other components within normal limits  CBG MONITORING, ED - Abnormal; Notable for the following components:   Glucose-Capillary 101 (*)    All other  components within normal limits  CBG MONITORING, ED - Abnormal; Notable for the following components:   Glucose-Capillary 116 (*)    All other components within normal limits  CBG MONITORING, ED - Abnormal; Notable for the following components:   Glucose-Capillary 155 (*)    All other components within normal limits  ETHANOL  PROTIME-INR  APTT  DIFFERENTIAL  RAPID URINE DRUG SCREEN, HOSP PERFORMED    Hemoglobin  Date Value Ref Range Status  09/13/2020 7.8 (L) 13.0 - 17.0 g/dL Final  09/13/2020 7.9 (L) 13.0 - 17.0 g/dL Final  08/28/2020 8.3 (L) 13.2 - 17.1 g/dL Final  05/26/2020 8.5 (L) 13.0 - 17.0 g/dL Final    EKG EKG Interpretation  Date/Time:  Saturday Sep 13 2020 17:06:03 EDT Ventricular Rate:  86 PR Interval:  166 QRS Duration: 100 QT Interval:  393 QTC Calculation: 471 R Axis:   -29 Text Interpretation: Sinus rhythm Borderline left axis deviation Nonspecific T abnormalities, lateral leads No significant change since last tracing Confirmed by Theotis Burrow [phone]) on 09/13/2020 8:46:40 PM   Radiology CT HEAD WO CONTRAST  Result Date: 09/13/2020 CLINICAL DATA:  Neuro deficit EXAM: CT HEAD WITHOUT CONTRAST TECHNIQUE: Contiguous axial images  were obtained from the base of the skull through the vertex without intravenous contrast. COMPARISON:  04/24/2020 FINDINGS: Brain: No findings to suggest acute hemorrhage, acute infarction or space-occupying mass lesion are noted. Areas of prior ischemia are noted in the deep white matter and extreme capsule on the left as well as in the midportion of the pons. No other focal abnormality is noted. Vascular: No hyperdense vessel or unexpected calcification. Skull: Normal. Negative for fracture or focal lesion. Sinuses/Orbits: No acute finding. Other: None. IMPRESSION: Chronic ischemic changes without acute abnormality. Electronically Signed   By: Inez Catalina M.D.   On: 09/13/2020 20:03    Procedures Procedures   Medications Ordered  in ED Medications - No data to display  ED Course  I have reviewed the triage vital signs and the nursing notes.  Pertinent labs & imaging results that were available during my care of the patient were reviewed by me and considered in my medical decision making (see chart for details).  Clinical Course as of 09/14/20 0021  Sat Sep 13, 2020  2103 Patient was again reassessed.  He is completely alert and oriented.  He is able to answer all questions and states he does not have any complaints.  He also states he has not eaten all day until he arrived in the ED. [SJ]    Clinical Course User Index [SJ] Sidda Humm, Helane Gunther, PA-C   MDM Rules/Calculators/A&P                          Patient presents with altered mental status.  We had suspicion that the patient may be having difficulty with his mental status due to recurrent hypoglycemia. Despite his initial CBG, we gave the patient food and drink and he improved shortly thereafter. We continue to monitor the patient and he was completely alert and oriented throughout the rest of his ED course. Patient demonstrated stability in his glucose as well. The patient was given instructions for home care as well as return precautions. Patient voices understanding of these instructions, accepts the plan, and is comfortable with discharge.  I reviewed and interpreted the patient's labs and radiological studies.  Findings and plan of care discussed with attending physician, Theotis Burrow, MD. Dr. Rex Kras personally evaluated and examined this patient.  Vitals:   09/13/20 2045 09/13/20 2100 09/13/20 2254 09/13/20 2357  BP: (!) 172/88  (!) 163/82 (!) 170/71  Pulse: 89 88 (!) 110 96  Resp: 19 15 12 20   Temp:      TempSrc:      SpO2: 98% 96% 100% 100%  Weight:      Height:         Final Clinical Impression(s) / ED Diagnoses Final diagnoses:  Confusion  Hypoglycemia    Rx / DC Orders ED Discharge Orders    None       Layla Maw 09/14/20 0021    Little, Wenda Overland, MD 09/14/20 [id number]

## 2020-09-13 NOTE — ED Notes (Signed)
Patient transported to CT 

## 2020-09-13 NOTE — ED Notes (Signed)
Dr. Rex Kras and PA Joy notified of pt CBG. Pt given graham crackers, OJ, and [place] sandwich

## 2020-09-13 NOTE — ED Triage Notes (Signed)
Pt arrives from home via EMS. Pt's family called EMS after waking pt up at 1600 and noticing increase aphasia and incontinence.  Initial BGL 50, Pt given 15g glucose, crackers and juice prior to EMS arrival. BGL with EMS 51 then 91 and 112 upon arrival to ED. EMS reports significant aphasia initially, verified by family. Pt also verified increase in aphasia. EMS reports that pt condition somewhat improved en route and pt feels that he is currently at baseline (which is slight deficits from previous stroke). EMS reports family was inebriated and somewhat unreliable historians. LKN 0300 BP 212/90- 192/88 HR 84 RR 20 O2 100% RA GCS 15 20g IV established in LAC

## 2020-09-14 DIAGNOSIS — R4182 Altered mental status, unspecified: Secondary | ICD-10-CM | POA: Diagnosis not present

## 2020-09-14 LAB — CBG MONITORING, ED
Glucose-Capillary: 64 mg/dL — ABNORMAL LOW (ref 70–99)
Glucose-Capillary: 147 mg/dL — ABNORMAL HIGH (ref 70–99)

## 2020-09-14 NOTE — ED Notes (Signed)
Pt sitting up eating breakfast tray 

## 2020-09-14 NOTE — ED Notes (Signed)
[employer] able to locate and make contact with family. Brother is on his way. Pt dressed and taken to lobby to wait. Pt alert and oriented X4

## 2020-09-14 NOTE — ED Notes (Signed)
Pt cannot remember the number to call to pick him up. This RN has tried all of the numbers listed in pt chart with no answers. Will notify charge RN.

## 2020-09-14 NOTE — Discharge Instructions (Addendum)
It is very important that you eat regular meals throughout the day to maintain adequate blood sugar levels. Follow-up with your primary care provider.

## 2020-09-14 NOTE — ED Notes (Signed)
This RN called [facility] - number in chart - no answer. This RN left voice mail.

## 2020-09-14 NOTE — ED Notes (Signed)
Called non emergent [employer] to assist in locating family. All staff attempts have failed in making any contact. Wellness check for family will be handled by [employer]. This RN will be notified with information

## 2020-09-14 NOTE — ED Notes (Signed)
Attempted to call family to pick up patient. Unable to contact anyone at this time

## 2020-09-14 NOTE — ED Notes (Signed)
Pt urinated on himself in bed - this RN changed pt linen and assisted pt back to bed

## 2020-09-14 NOTE — ED Notes (Signed)
Ordered breakfast tray  

## 2020-09-14 NOTE — ED Notes (Signed)
Attempted to contact Gregory Nunez Sister - [phone]. L/M on VM.

## 2020-09-14 NOTE — ED Notes (Signed)
Pt CBG 64, pt given graham crackers and orange juice. Dr. Randal Buba notified of pt CBG and that this RN gave him crackers.

## 2020-10-05 ENCOUNTER — Emergency Department (HOSPITAL_COMMUNITY): Payer: Medicare Other

## 2020-10-05 ENCOUNTER — Inpatient Hospital Stay (HOSPITAL_COMMUNITY)
Admission: EM | Admit: 2020-10-05 | Discharge: 2020-10-08 | DRG: 101 | Disposition: A | Discharge status: Home / Self Care | Payer: Medicare Other | Attending: Internal Medicine | Admitting: Internal Medicine

## 2020-10-05 ENCOUNTER — Encounter (HOSPITAL_COMMUNITY): Payer: Self-pay | Admitting: Emergency Medicine

## 2020-10-05 ENCOUNTER — Other Ambulatory Visit: Payer: Self-pay

## 2020-10-05 DIAGNOSIS — D649 Anemia, unspecified: Secondary | ICD-10-CM | POA: Diagnosis present

## 2020-10-05 DIAGNOSIS — I152 Hypertension secondary to endocrine disorders: Secondary | ICD-10-CM | POA: Diagnosis present

## 2020-10-05 DIAGNOSIS — E1169 Type 2 diabetes mellitus with other specified complication: Secondary | ICD-10-CM | POA: Diagnosis not present

## 2020-10-05 DIAGNOSIS — Z8249 Family history of ischemic heart disease and other diseases of the circulatory system: Secondary | ICD-10-CM

## 2020-10-05 DIAGNOSIS — F17210 Nicotine dependence, cigarettes, uncomplicated: Secondary | ICD-10-CM | POA: Diagnosis present

## 2020-10-05 DIAGNOSIS — R55 Syncope and collapse: Secondary | ICD-10-CM

## 2020-10-05 DIAGNOSIS — Z7289 Other problems related to lifestyle: Secondary | ICD-10-CM | POA: Diagnosis not present

## 2020-10-05 DIAGNOSIS — Z7952 Long term (current) use of systemic steroids: Secondary | ICD-10-CM

## 2020-10-05 DIAGNOSIS — K922 Gastrointestinal hemorrhage, unspecified: Secondary | ICD-10-CM

## 2020-10-05 DIAGNOSIS — W1830XA Fall on same level, unspecified, initial encounter: Secondary | ICD-10-CM | POA: Diagnosis present

## 2020-10-05 DIAGNOSIS — G40909 Epilepsy, unspecified, not intractable, without status epilepticus: Secondary | ICD-10-CM | POA: Diagnosis not present

## 2020-10-05 DIAGNOSIS — H35033 Hypertensive retinopathy, bilateral: Secondary | ICD-10-CM | POA: Diagnosis present

## 2020-10-05 DIAGNOSIS — Z20822 Contact with and (suspected) exposure to covid-19: Secondary | ICD-10-CM | POA: Diagnosis present

## 2020-10-05 DIAGNOSIS — Z8673 Personal history of transient ischemic attack (TIA), and cerebral infarction without residual deficits: Secondary | ICD-10-CM

## 2020-10-05 DIAGNOSIS — Z7984 Long term (current) use of oral hypoglycemic drugs: Secondary | ICD-10-CM

## 2020-10-05 DIAGNOSIS — K5520 Angiodysplasia of colon without hemorrhage: Secondary | ICD-10-CM | POA: Diagnosis present

## 2020-10-05 DIAGNOSIS — Z789 Other specified health status: Secondary | ICD-10-CM

## 2020-10-05 DIAGNOSIS — N1832 Chronic kidney disease, stage 3b: Secondary | ICD-10-CM | POA: Diagnosis present

## 2020-10-05 DIAGNOSIS — Z79899 Other long term (current) drug therapy: Secondary | ICD-10-CM

## 2020-10-05 DIAGNOSIS — E1122 Type 2 diabetes mellitus with diabetic chronic kidney disease: Secondary | ICD-10-CM | POA: Diagnosis present

## 2020-10-05 DIAGNOSIS — E119 Type 2 diabetes mellitus without complications: Secondary | ICD-10-CM

## 2020-10-05 DIAGNOSIS — I248 Other forms of acute ischemic heart disease: Secondary | ICD-10-CM | POA: Diagnosis present

## 2020-10-05 DIAGNOSIS — I69319 Unspecified symptoms and signs involving cognitive functions following cerebral infarction: Secondary | ICD-10-CM

## 2020-10-05 DIAGNOSIS — E118 Type 2 diabetes mellitus with unspecified complications: Secondary | ICD-10-CM

## 2020-10-05 DIAGNOSIS — I129 Hypertensive chronic kidney disease with stage 1 through stage 4 chronic kidney disease, or unspecified chronic kidney disease: Secondary | ICD-10-CM | POA: Diagnosis present

## 2020-10-05 DIAGNOSIS — Z720 Tobacco use: Secondary | ICD-10-CM | POA: Diagnosis present

## 2020-10-05 DIAGNOSIS — I69398 Other sequelae of cerebral infarction: Secondary | ICD-10-CM

## 2020-10-05 DIAGNOSIS — E1159 Type 2 diabetes mellitus with other circulatory complications: Secondary | ICD-10-CM

## 2020-10-05 DIAGNOSIS — F1090 Alcohol use, unspecified, uncomplicated: Secondary | ICD-10-CM

## 2020-10-05 DIAGNOSIS — K219 Gastro-esophageal reflux disease without esophagitis: Secondary | ICD-10-CM | POA: Diagnosis present

## 2020-10-05 DIAGNOSIS — Z803 Family history of malignant neoplasm of breast: Secondary | ICD-10-CM

## 2020-10-05 DIAGNOSIS — Y9301 Activity, walking, marching and hiking: Secondary | ICD-10-CM | POA: Diagnosis present

## 2020-10-05 DIAGNOSIS — E785 Hyperlipidemia, unspecified: Secondary | ICD-10-CM | POA: Diagnosis present

## 2020-10-05 DIAGNOSIS — E876 Hypokalemia: Secondary | ICD-10-CM | POA: Diagnosis present

## 2020-10-05 DIAGNOSIS — Z880 Allergy status to penicillin: Secondary | ICD-10-CM

## 2020-10-05 DIAGNOSIS — E113293 Type 2 diabetes mellitus with mild nonproliferative diabetic retinopathy without macular edema, bilateral: Secondary | ICD-10-CM | POA: Diagnosis present

## 2020-10-05 DIAGNOSIS — I69328 Other speech and language deficits following cerebral infarction: Secondary | ICD-10-CM

## 2020-10-05 DIAGNOSIS — Z8546 Personal history of malignant neoplasm of prostate: Secondary | ICD-10-CM

## 2020-10-05 DIAGNOSIS — D500 Iron deficiency anemia secondary to blood loss (chronic): Secondary | ICD-10-CM | POA: Diagnosis present

## 2020-10-05 HISTORY — DX: Gastrointestinal hemorrhage, unspecified: K92.2

## 2020-10-05 LAB — RESP PANEL BY RT-PCR (FLU A&B, COVID) ARPGX2
Influenza A by PCR: NEGATIVE
Influenza B by PCR: NEGATIVE
SARS Coronavirus 2 by RT PCR: NEGATIVE

## 2020-10-05 LAB — HEPATIC FUNCTION PANEL
ALT: 29 U/L (ref 0–44)
AST: 35 U/L (ref 15–41)
Albumin: 2.3 g/dL — ABNORMAL LOW (ref 3.5–5.0)
Alkaline Phosphatase: 44 U/L (ref 38–126)
Bilirubin, Direct: <0.1 mg/dL (ref 0.0–0.2)
Total Bilirubin: 0.5 mg/dL (ref 0.3–1.2)
Total Protein: 6.2 g/dL — ABNORMAL LOW (ref 6.5–8.1)

## 2020-10-05 LAB — BASIC METABOLIC PANEL
Anion gap: 7 (ref 5–15)
BUN: 21 mg/dL (ref 8–23)
CO2: 16 mmol/L — ABNORMAL LOW (ref 22–32)
Calcium: 7.4 mg/dL — ABNORMAL LOW (ref 8.9–10.3)
Chloride: 113 mmol/L — ABNORMAL HIGH (ref 98–111)
Creatinine, Ser: 1.51 mg/dL — ABNORMAL HIGH (ref 0.61–1.24)
GFR, Estimated: 49 mL/min — ABNORMAL LOW (ref >60)
Glucose, Bld: 117 mg/dL — ABNORMAL HIGH (ref 70–99)
Potassium: 3.4 mmol/L — ABNORMAL LOW (ref 3.5–5.1)
Sodium: 136 mmol/L (ref 135–145)

## 2020-10-05 LAB — CBG MONITORING, ED: Glucose-Capillary: 114 mg/dL — ABNORMAL HIGH (ref 70–99)

## 2020-10-05 LAB — CBC
HCT: 21.6 % — ABNORMAL LOW (ref 39.0–52.0)
Hemoglobin: 6.7 g/dL — CL (ref 13.0–17.0)
MCH: 28.6 pg (ref 26.0–34.0)
MCHC: 31 g/dL (ref 30.0–36.0)
MCV: 92.3 fL (ref 80.0–100.0)
Platelets: 183 10*3/uL (ref 150–400)
RBC: 2.34 MIL/uL — ABNORMAL LOW (ref 4.22–5.81)
RDW: 14.1 % (ref 11.5–15.5)
WBC: 3.3 10*3/uL — ABNORMAL LOW (ref 4.0–10.5)
nRBC: 0 % (ref 0.0–0.2)

## 2020-10-05 LAB — TROPONIN I (HIGH SENSITIVITY)
Troponin I (High Sensitivity): 6 ng/L (ref <18)
Troponin I (High Sensitivity): 8 ng/L (ref <18)

## 2020-10-05 LAB — POC OCCULT BLOOD, ED: Fecal Occult Bld: POSITIVE — AB

## 2020-10-05 LAB — MAGNESIUM: Magnesium: 1.7 mg/dL (ref 1.7–2.4)

## 2020-10-05 MED ORDER — PANTOPRAZOLE INFUSION (NEW) - SIMPLE MED
8.0000 mg/h | INTRAVENOUS | Status: DC
  Filled 2020-10-05: qty 100

## 2020-10-05 MED ORDER — POLYSACCHARIDE IRON COMPLEX 150 MG PO CAPS
150.0000 mg | ORAL_CAPSULE | Freq: Every day | ORAL | Status: DC
  Administered 2020-10-06 – 2020-10-08 (×3): 150 mg via ORAL
  Filled 2020-10-05 (×3): qty 1

## 2020-10-05 MED ORDER — POLYETHYLENE GLYCOL 3350 17 G PO PACK
17.0000 g | PACK | Freq: Every day | ORAL | Status: DC
  Administered 2020-10-06 – 2020-10-08 (×3): 17 g via ORAL
  Filled 2020-10-05 (×3): qty 1

## 2020-10-05 MED ORDER — ADULT MULTIVITAMIN W/MINERALS CH
1.0000 | ORAL_TABLET | Freq: Every day | ORAL | Status: DC
  Administered 2020-10-06 – 2020-10-08 (×3): 1 via ORAL
  Filled 2020-10-05 (×3): qty 1

## 2020-10-05 MED ORDER — INSULIN ASPART 100 UNIT/ML IJ SOLN
0.0000 [IU] | Freq: Three times a day (TID) | INTRAMUSCULAR | Status: DC
  Administered 2020-10-06: 3 [IU] via SUBCUTANEOUS
  Administered 2020-10-07: 1 [IU] via SUBCUTANEOUS
  Administered 2020-10-08: 3 [IU] via SUBCUTANEOUS

## 2020-10-05 MED ORDER — LORAZEPAM 2 MG/ML IJ SOLN: 1.0000 mg | INTRAMUSCULAR | Status: DC | PRN

## 2020-10-05 MED ORDER — ALBUTEROL SULFATE (2.5 MG/3ML) 0.083% IN NEBU: 2.5000 mg | INHALATION_SOLUTION | Freq: Four times a day (QID) | RESPIRATORY_TRACT | Status: DC | PRN

## 2020-10-05 MED ORDER — FOLIC ACID 1 MG PO TABS
1.0000 mg | ORAL_TABLET | Freq: Every day | ORAL | Status: DC
  Administered 2020-10-06 – 2020-10-08 (×3): 1 mg via ORAL
  Filled 2020-10-05 (×3): qty 1

## 2020-10-05 MED ORDER — ACETAMINOPHEN 325 MG PO TABS
650.0000 mg | ORAL_TABLET | Freq: Four times a day (QID) | ORAL | Status: DC | PRN
Start: 2020-10-05 — End: 2020-10-08

## 2020-10-05 MED ORDER — LORAZEPAM 1 MG PO TABS: 1.0000 mg | ORAL_TABLET | ORAL | Status: DC | PRN

## 2020-10-05 MED ORDER — ACETAMINOPHEN 650 MG RE SUPP
650.0000 mg | Freq: Four times a day (QID) | RECTAL | Status: DC | PRN
Start: 2020-10-05 — End: 2020-10-08

## 2020-10-05 MED ORDER — ATORVASTATIN CALCIUM 40 MG PO TABS
40.0000 mg | ORAL_TABLET | Freq: Every day | ORAL | Status: DC
  Administered 2020-10-06 – 2020-10-07 (×2): 40 mg via ORAL
  Filled 2020-10-05 (×2): qty 1

## 2020-10-05 MED ORDER — PANTOPRAZOLE 80MG IVPB - SIMPLE MED
80.0000 mg | Freq: Once | INTRAVENOUS | Status: DC
  Administered 2020-10-05: 80 mg via INTRAVENOUS
  Filled 2020-10-05: qty 80

## 2020-10-05 MED ORDER — SODIUM CHLORIDE 0.9 % IV SOLN: 10.0000 mL/h | Freq: Once | INTRAVENOUS | Status: DC

## 2020-10-05 MED ORDER — PANTOPRAZOLE SODIUM 40 MG IV SOLR: 40.0000 mg | Freq: Two times a day (BID) | INTRAVENOUS | Status: DC

## 2020-10-05 MED ORDER — PANTOPRAZOLE SODIUM 40 MG IV SOLR
40.0000 mg | Freq: Two times a day (BID) | INTRAVENOUS | Status: DC
  Administered 2020-10-06 (×2): 40 mg via INTRAVENOUS
  Filled 2020-10-05 (×2): qty 40

## 2020-10-05 MED ORDER — LACTATED RINGERS IV SOLN: INTRAVENOUS | Status: AC

## 2020-10-05 MED ORDER — SENNOSIDES-DOCUSATE SODIUM 8.6-50 MG PO TABS: 2.0000 | ORAL_TABLET | Freq: Every evening | ORAL | Status: DC | PRN

## 2020-10-05 MED ORDER — THIAMINE HCL 100 MG PO TABS
100.0000 mg | ORAL_TABLET | Freq: Every day | ORAL | Status: DC
  Administered 2020-10-06 – 2020-10-08 (×3): 100 mg via ORAL
  Filled 2020-10-05 (×3): qty 1

## 2020-10-05 MED ORDER — NICOTINE 21 MG/24HR TD PT24
21.0000 mg | MEDICATED_PATCH | Freq: Every day | TRANSDERMAL | Status: DC
  Administered 2020-10-06 – 2020-10-08 (×3): 21 mg via TRANSDERMAL
  Filled 2020-10-05 (×4): qty 1

## 2020-10-05 MED ORDER — ENSURE ENLIVE PO LIQD
237.0000 mL | Freq: Two times a day (BID) | ORAL | Status: DC
  Administered 2020-10-06 – 2020-10-08 (×4): 237 mL via ORAL
  Filled 2020-10-05: qty 237

## 2020-10-05 MED ORDER — POTASSIUM CHLORIDE CRYS ER 20 MEQ PO TBCR
40.0000 meq | EXTENDED_RELEASE_TABLET | Freq: Once | ORAL | Status: DC
  Filled 2020-10-05: qty 2

## 2020-10-05 MED ORDER — SODIUM CHLORIDE 0.9% FLUSH
3.0000 mL | Freq: Two times a day (BID) | INTRAVENOUS | Status: DC
  Administered 2020-10-06 – 2020-10-08 (×5): 3 mL via INTRAVENOUS

## 2020-10-05 MED ORDER — PANTOPRAZOLE SODIUM 40 MG PO TBEC
80.0000 mg | DELAYED_RELEASE_TABLET | Freq: Once | ORAL | Status: AC
  Administered 2020-10-05: 80 mg via ORAL
  Filled 2020-10-05: qty 2

## 2020-10-05 NOTE — ED Provider Notes (Signed)
Emergency Medicine Provider Triage Evaluation Note  Gregory Nunez. , a 75 y.o. male  was evaluated in triage.  Pt complains of syncopal event at home with family.  Patient endorses feeling dizzy and lightheaded after the incident.  Per EMS upon arrival patient was pale, diaphoretic, and bradycardic to the 40s.  Vital signs otherwise stable.  Patient did have recent CVA with speech difficulty and difficulty ambulating, however states that he normally walks on his own without a cane or walker at this time.  Upon chart review, CVA was in January 2022.  Patient is not anticoagulated..  Review of Systems  Positive: Syncope, lightheadedness, somnolence Negative: Chest pain, palpitations, lightheadedness, blurry vision, double vision  Physical Exam  Ht 5\' 11"  (1.803 m)   Wt 83.9 kg   BMI 25.80 kg/m  Gen:   Somnolent, no distress, ANO x4 Resp:  Normal effort  MSK:   Moves extremities without difficulty  Other:  No focal deficit on neuro exam.  Lungs CTA B, RRR, no M/R/G. HR 60 at this time.   Medical Decision Making  Medically screening exam initiated at 5:45 PM.  Appropriate orders placed.  [name] Manuela Nunez. was informed that the remainder of the evaluation will be completed by another provider, this initial triage assessment does not replace that evaluation, and the importance of remaining in the ED until their evaluation is complete.  This chart was dictated using voice recognition software, Dragon. Despite the best efforts of this provider to proofread and correct errors, errors may still occur which can change documentation meaning.  Complication with transfer of vitals in the system, however he is normotensive with systolic of 606, heart rate in the 60s, oxygen saturation 98% on room air, and temperature of 97.5 F.   Aura Dials 10/05/20 1801    Arnaldo Natal, MD 10/05/20 2009

## 2020-10-05 NOTE — H&P (Signed)
History and Physical   Gregory Nunez. JIR:[id number] DOB: September 26, 1945 DOA: 10/05/2020  PCP: [name], MD   Patient coming from: Home  Chief Complaint: Found down,?Syncope  HPI: Gregory Nunez. is a 75 y.o. 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 Patient reports that earlier he was walking outside and he noted he had some dizziness.  Later he then had a syncopal event. His family found him and called EMS.  When EMS arrived he was noted to be pale, diaphoretic, and dizzy.  He noted to have episode of bradycardia to 49 per EMS. He denies fevers, chills, chest pain, shortness of breath, abdominal pain, constipation, diarrhea, nausea, vomiting.  ED Course: Vital signs in the ED were stable other than mildly low temperature at 97.5.  Lab work-up showed CMP with potassium of 3.4, chloride 113, bicarb 16, creatinine mildly elevated to 1.51 from baseline of around 1.3.  Calcium 7.4 which improved considering albumin of 2.3, protein 6.2.  Hemoglobin noted to be 6.7 down from 7.8, white count 3.3 which is near his baseline in the fours.  Troponin negative x2.  FOBT positive for hemoglobin.  Respiratory panel for flu and COVID pending.  Urinalysis pending.  Patient typed and screened.  In the ED patient was ordered 2 units and GI was consulted who will see the patient and recommend n.p.o. status at midnight.  Review of Systems: As per HPI otherwise all other systems reviewed and are negative.   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   Patient is approximately 5-week status post bunion correction left foot   Hyperlipidemia    Hypertension    Hypertensive retinopathy    OU   Malignant neoplasm of prostate ([place]) 01/09/2014   Pancreatitis    Prostate cancer ([place]) 12/19/13   Gleason 4+3=7, volume 31.31 cc   Shortness of breath dyspnea    with exertion     Past Surgical History:  Procedure Laterality Date   BIOPSY  04/16/2018   Procedure: BIOPSY;  Surgeon: Juanita Craver, MD;  Location: [facility];  Service: Endoscopy;;   BIOPSY  05/23/2020   Procedure: BIOPSY;  Surgeon: Jackquline Denmark, MD;  Location: [facility];  Service: Gastroenterology;;  EGD and COLON   biopsy on throat     hx of    CATARACT EXTRACTION Bilateral    Dr. Quentin Ore   COLONOSCOPY N/A 05/23/2020   Procedure: COLONOSCOPY;  Surgeon: Jackquline Denmark, MD;  Location: [facility];  Service: Gastroenterology;  Laterality: N/A;   ESOPHAGOGASTRODUODENOSCOPY Left 04/16/2018   Procedure: ESOPHAGOGASTRODUODENOSCOPY (EGD);  Surgeon: Juanita Craver, MD;  Location: [facility];  Service: Endoscopy;  Laterality: Left;   ESOPHAGOGASTRODUODENOSCOPY (EGD) WITH PROPOFOL N/A 05/23/2020   Procedure: ESOPHAGOGASTRODUODENOSCOPY (EGD) WITH PROPOFOL;  Surgeon: Jackquline Denmark, MD;  Location: [facility];  Service: Gastroenterology;  Laterality: N/A;   EYE SURGERY     FOOT SURGERY     HOT HEMOSTASIS N/A 04/16/2018   Procedure: HOT HEMOSTASIS (ARGON PLASMA COAGULATION/BICAP);  Surgeon: Juanita Craver,  MD;  Location: [facility];  Service: Endoscopy;  Laterality: N/A;   HOT HEMOSTASIS N/A 05/23/2020   Procedure: HOT HEMOSTASIS (ARGON PLASMA COAGULATION/BICAP);  Surgeon: Jackquline Denmark, MD;  Location: [facility];  Service: Gastroenterology;  Laterality: N/A;   LYMPHADENECTOMY Bilateral 02/27/2014   Procedure: BILATERAL LYMPHADENECTOMY;  Surgeon: Alexis Frock, MD;  Location: [facility];  Service: Urology;  Laterality: Bilateral;   POLYPECTOMY  05/23/2020    Procedure: POLYPECTOMY;  Surgeon: Jackquline Denmark, MD;  Location: [facility];  Service: Gastroenterology;;   PROSTATE BIOPSY  12/2013   Gleason 4+3=7, volume 31.31 cc   ROBOT ASSISTED LAPAROSCOPIC RADICAL PROSTATECTOMY N/A 02/27/2014   Procedure: ROBOTIC ASSISTED LAPAROSCOPIC RADICAL PROSTATECTOMY WITH INDOCYANINE GREEN DYE;  Surgeon: Alexis Frock, MD;  Location: [facility];  Service: Urology;  Laterality: N/A;    Social History  reports that he has been smoking. He has been smoking an average of 0.50 packs per day. He has never used smokeless tobacco. He reports previous alcohol use of about 1.0 standard drink of alcohol per week. He reports previous drug use. Drugs: Marijuana and Cocaine.  Allergies  Allergen Reactions   Penicillins     Nervous    Penicillins Anxiety and Other (See Comments)    DID THE REACTION INVOLVE: Swelling of the face/tongue/throat, SOB, or low BP? No Sudden or severe rash/hives, skin peeling, or the inside of the mouth or nose? No Did it require medical treatment? No When did it last happen?      many years ago If all above answers are "NO", may proceed with cephalosporin use.       Family History  Problem Relation Age of Onset   Heart disease Mother    Cancer Sister        breast   Colon cancer Neg Hx    Esophageal cancer Neg Hx    Rectal cancer Neg Hx    Stomach cancer Neg Hx   Reviewed on admission  Prior to Admission medications   Medication Sig Start Date End Date Taking? Authorizing Provider  albuterol (VENTOLIN HFA) 108 (90 Base) MCG/ACT inhaler Inhale 2 puffs into the lungs every 6 (six) hours as needed for wheezing or shortness of breath. 08/21/19   [provider]  amLODipine (NORVASC) 10 MG tablet Take 10 mg by mouth daily. 09/30/19   [provider]  atorvastatin (LIPITOR) 40 MG tablet Take 1 tablet (40 mg total) by mouth daily. Patient taking differently: Take 40 mg by mouth at bedtime. 04/25/20 07/24/20  [place] Donnamarie Poag, DO   carvedilol (COREG) 25 MG tablet Take 1 tablet (25 mg total) by mouth 2 (two) times daily with a meal. Patient taking differently: Take 25 mg by mouth 2 (two) times daily with a meal. Hold if SBP<100, HR<60 05/08/20   [name], Rinka R, MD  Ensure Plus (ENSURE PLUS) LIQD Take 237 mLs by mouth 2 (two) times daily between meals.    [provider]  folic acid (FOLVITE) 1 MG tablet Take 1 tablet (1 mg total) by mouth daily. 05/27/20   Eugenie Filler, MD  iron polysaccharides (NU-IRON) 150 MG capsule Take 1 capsule (150 mg total) by mouth daily. 05/26/20   Eugenie Filler, MD  metFORMIN (GLUCOPHAGE) 500 MG tablet Take 500 mg by mouth 2 (two) times daily. 02/13/19   [provider]  Multiple Vitamin (MULTIVITAMIN WITH MINERALS) TABS tablet Take 1 tablet by mouth daily. 05/27/20   Eugenie Filler, MD  nicotine (NICODERM CQ - DOSED IN  MG/24 HOURS) 21 mg/24hr patch Place 1 patch (21 mg total) onto the skin daily. 05/09/20   [name], Michell Heinrich, MD  pantoprazole (PROTONIX) 40 MG tablet Take 40 mg by mouth daily.    [provider]  polyethylene glycol (MIRALAX / GLYCOLAX) 17 g packet Take 17 g by mouth daily. 05/09/20   [name], Michell Heinrich, MD  senna-docusate (SENOKOT-S) 8.6-50 MG tablet Take 2 tablets by mouth at bedtime. 05/26/20   Eugenie Filler, MD  thiamine 100 MG tablet Take 1 tablet (100 mg total) by mouth daily. 05/27/20   Eugenie Filler, MD  traZODone (DESYREL) 50 MG tablet Take 50 mg by mouth at bedtime as needed for sleep.    [provider]  COMBIVENT RESPIMAT 20-100 MCG/ACT AERS respimat Inhale 1 puff into the lungs every 6 (six) hours as needed for shortness of breath. 06/12/18 08/19/19  [provider]  ferrous sulfate 325 (65 FE) MG tablet Take 1 tablet (325 mg total) by mouth 2 (two) times daily with a meal. Patient not taking: Reported on 08/19/2019 04/17/18 08/19/19  Dana Allan I, MD  mometasone-formoterol (DULERA) 100-5 MCG/ACT AERO Inhale 2  puffs into the lungs daily. Patient not taking: Reported on 08/19/2019 04/17/18 08/19/19  Dana Allan I, MD  sucralfate (CARAFATE) 1 g tablet Take 1 tablet (1 g total) by mouth 4 (four) times daily. Patient not taking: Reported on 08/19/2019 04/17/18 08/19/19  Dana Allan I, MD  tiotropium (SPIRIVA HANDIHALER) 18 MCG inhalation capsule Place 1 capsule (18 mcg total) into inhaler and inhale daily. Patient not taking: Reported on 08/19/2019 04/17/18 08/19/19  Bonnell Public, MD    Physical Exam: Vitals:   10/05/20 2030 10/05/20 2104 10/05/20 2145 10/05/20 2236  BP: (!) 112/45 133/83 140/87 121/81  Pulse: 74 85 79 80  Resp: 14 16 15 12   Temp:    97.6 F (36.4 C)  TempSrc:    Oral  SpO2: 100% 100% 100% 99%  Weight:      Height:       Physical Exam Constitutional:      General: He is not in acute distress.    Appearance: Normal appearance.  HENT:     Head: Normocephalic and atraumatic.     Mouth/Throat:     Mouth: Mucous membranes are moist.     Pharynx: Oropharynx is clear.  Eyes:     Extraocular Movements: Extraocular movements intact.     Pupils: Pupils are equal, round, and reactive to light.  Cardiovascular:     Rate and Rhythm: Normal rate and regular rhythm.     Pulses: Normal pulses.     Heart sounds: Normal heart sounds.  Pulmonary:     Effort: Pulmonary effort is normal. No respiratory distress.     Breath sounds: Normal breath sounds.  Abdominal:     General: Bowel sounds are normal. There is no distension.     Palpations: Abdomen is soft.     Tenderness: There is no abdominal tenderness.  Musculoskeletal:        General: No swelling or deformity.  Skin:    General: Skin is warm and dry.  Neurological:     General: No focal deficit present.     Mental Status: Mental status is at baseline.   Labs on Admission: I have personally reviewed following labs and imaging studies  CBC: Recent Labs  Lab 10/05/20 1738  WBC 3.3*  HGB 6.7*  HCT 21.6*  MCV  92.3  PLT 183  Basic Metabolic Panel: Recent Labs  Lab 10/05/20 1738  NA 136  K 3.4*  CL 113*  CO2 16*  GLUCOSE 117*  BUN 21  CREATININE 1.51*  CALCIUM 7.4*  MG 1.7    GFR: Estimated Creatinine Clearance: 47.1 mL/min (A) (by C-G formula based on SCr of 1.51 mg/dL (H)).  Liver Function Tests: Recent Labs  Lab 10/05/20 1738  AST 35  ALT 29  ALKPHOS 44  BILITOT 0.5  PROT 6.2*  ALBUMIN 2.3*    Urine analysis:    Component Value Date/Time   COLORURINE STRAW (A) 09/13/2020 1831   APPEARANCEUR CLEAR 09/13/2020 1831   LABSPEC 1.007 09/13/2020 1831   PHURINE 6.0 09/13/2020 1831   GLUCOSEU NEGATIVE 09/13/2020 1831   HGBUR SMALL (A) 09/13/2020 1831   BILIRUBINUR NEGATIVE 09/13/2020 1831   KETONESUR NEGATIVE 09/13/2020 1831   PROTEINUR >=300 (A) 09/13/2020 1831   UROBILINOGEN 1.0 01/10/2019 1132   NITRITE NEGATIVE 09/13/2020 1831   LEUKOCYTESUR NEGATIVE 09/13/2020 1831    Radiological Exams on Admission: DG Chest 2 View  Result Date: 10/05/2020 CLINICAL DATA:  Syncope EXAM: CHEST - 2 VIEW COMPARISON:  11/07/2018 FINDINGS: The heart size and mediastinal contours are within normal limits. Both lungs are clear. The visualized skeletal structures are unremarkable. IMPRESSION: No active cardiopulmonary disease. Electronically Signed   By: Franchot Gallo M.D.   On: 10/05/2020 19:24   CT HEAD WO CONTRAST  Result Date: 10/05/2020 CLINICAL DATA:  Syncope EXAM: CT HEAD WITHOUT CONTRAST TECHNIQUE: Contiguous axial images were obtained from the base of the skull through the vertex without intravenous contrast. COMPARISON:  CT head 09/13/2020 FINDINGS: Brain: Generalized atrophy. Chronic microvascular ischemic change in the white matter. Chronic infarct in the deep white matter on the left unchanged. Chronic infarct in the left pons unchanged. Negative for acute infarct, hemorrhage, mass. Vascular: Negative for hyperdense vessel Skull: Negative Sinuses/Orbits: Paranasal sinuses  clear. Bilateral cataract extraction Other: None IMPRESSION: Atrophy and chronic ischemic change. No acute abnormality no change from the prior study. Electronically Signed   By: Franchot Gallo M.D.   On: 10/05/2020 18:32    EKG: Independently reviewed.  Normal sinus rhythm at 60 bpm.  Some T wave inversion in inferior lateral leads.  Appears to be more pronounced/new from previous.  Assessment/Plan Principal Problem:   GI bleed Active Problems:   Hypertension associated with diabetes ([place])   Type 2 diabetes mellitus without complication ([facility])   History of CVA (cerebrovascular accident)   Hyperlipidemia associated with type 2 diabetes mellitus ([place])   Alcohol use  GI bleed > Patient with known history of GI bleed coming in after being found down pale diaphoretic and dizzy (with episode of bradycardia which resolved upon ED arrival). > Noted to have hemoglobin 6.7 down from previous 7.8.  History of gastric AVM. > 2 units ordered in the ED and GI was consulted who will see the patient in the morning and recommend n.p.o. at midnight. - Monitor on telemetry - Continue with 2 units packed red blood cells - Posttransfusion hemoglobin check, and trend hemoglobin - N.p.o. at midnight  Syncope > Syncopal event while walking outside leading to him being found down. > Possibly due to symptomatic anemia versus vasovagal considering he was initially bradycardic for EMS but has not had this here. - Continue to monitor on telemetry as above - Could consider orthostatics however he is already been receiving blood/fluids and denies lightheadedness.  Hyperlipidemia History of CVA -Continue home statin  Hypertension -Holding home  carvedilol, amlodipine given borderline blood pressures  GERD -Switching PPI to IV given GI bleed  Hypokalemia Creatinine elevation > Creatinine mildly elevated 1.5 from baseline around 1.3.  Potassium noted to be 3.4.  Magnesium within normal limits. - We will give  40 mEq p.o. potassium. - Avoid nephrotoxic agents - Trend renal function and electrolytes  Alcohol use > Reports drinking most days but not every day.  States last time he had withdrawal was months ago.  State his last drink was today. > Has history of withdrawal and withdrawal seizures - Continue home thiamine, folate, multivitamin - Add on ethanol level - CIWA without Ativan for now, as his last drink was today do not suspect symptoms would start for 1 or 2 days.  EKG changes > As above some increased T wave inversion, likely in the setting of increased demand with his anemia.  No troponin elevation in ED. -Continue to monitor  Diabetes - SSI  DVT prophylaxis: SCDs  Code Status:   Full  Family Communication:  None on admission. Disposition Plan:   Patient is from:  Home  Anticipated DC to:  Home  Anticipated DC date:  1 to 3 days  Anticipated DC barriers: None  Consults called:  GI consulted by EDP  Admission status:  Observation, telemetry   Severity of Illness: The appropriate patient status for this patient is OBSERVATION. Observation status is judged to be reasonable and necessary in order to provide the required intensity of service to ensure the patient's safety. The patient's presenting symptoms, physical exam findings, and initial radiographic and laboratory data in the context of their medical condition is felt to place them at decreased risk for further clinical deterioration. Furthermore, it is anticipated that the patient will be medically stable for discharge from the hospital within 2 midnights of admission. The following factors support the patient status of observation.   " The patient's presenting symptoms include dizziness, syncope. " The physical exam findings include stable physical exam. " The initial radiographic and laboratory data are potassium 304, creatinine 1.5 baseline 1.3, hemoglobin 6.7 from baseline 7.8, white cells 3.3, FOBT positive.   Marcelyn Bruins MD [facility]  How to contact the [facility] Attending or Consulting provider [place] or covering provider during after hours [place], for this patient?   Check the care team in [facility] and look for a) attending/consulting [facility] provider listed and b) the [facility] team listed Log into [email or web address] and use [place]'s universal password to access. If you do not have the password, please contact the hospital operator. Locate the [facility] provider you are looking for under [facility] and page to a number that you can be directly reached. If you still have difficulty reaching the provider, please page the [facility] (Director on Call) for the Hospitalists listed on amion for assistance.  10/05/2020, 10:38 PM

## 2020-10-05 NOTE — ED Triage Notes (Signed)
Pt here via [facility] from home for unwitnessed syncope, found by family and carried inside. Upon [facility] arrival pt was pale and diaphoretic, c/o dizziness. Bradycardia at 49, all other vitals stable. Pt c/o dizziness w/ standing and before syncope. Pt had recent stroke w/ speech and difficulty ambulating deficits.

## 2020-10-05 NOTE — ED Provider Notes (Signed)
[place] EMERGENCY DEPARTMENT Provider Note   CSN: [id number] Arrival date & time: 10/05/20  1726     History Chief Complaint  Patient presents with  . Loss of Consciousness  . Dizziness    [place]ak[place] Elsen Oree Hislop. is a 75 y.o. male.  Han Vejar [name]. told me that he fell while walking in his yard.  He states that he feels completely fine and denies any symptoms.  EMS history was that the patient was found outside by family.  He was diaphoretic and slightly bradycardic upon arrival.  The patient told me that he lives with other people and normally ambulates without a cane or a walker.  The history is provided by the patient.  Fall This is a new problem. The current episode started 1 to 2 hours ago. The problem occurs constantly. The problem has been resolved. Pertinent negatives include no chest pain, no abdominal pain, no headaches and no shortness of breath. Nothing aggravates the symptoms. Nothing relieves the symptoms. He has tried nothing for the symptoms. The treatment provided no relief.      Past Medical History:  Diagnosis Date  . Cancer [facility])    prostate  . Cataract    OD  . Depression   . Diabetes mellitus without complication ([place])   . Diabetic retinopathy ([place])    NPDR OU  . GERD (gastroesophageal reflux disease)    if drinks alcohol  . 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 Hyperlipidemia   . Hypertension   . Hypertensive retinopathy    OU  . Malignant neoplasm of prostate ([place]) 01/09/2014  . Pancreatitis   . 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 Shortness of breath dyspnea    with exertion     Patient Active Problem List   Diagnosis Date Noted  . Melena   . Upper GI bleed   . Iron deficiency anemia 05/22/2020  . Constipation 05/22/2020  . GIB (gastrointestinal bleeding) 05/21/2020  . Tobacco abuse   . 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 Acute CVA (cerebrovascular accident) ([place]) 04/23/2020  . Hyperlipidemia associated with type 2 diabetes mellitus ([place]) 04/23/2020  . Alcohol use 04/23/2020  . AKI (acute kidney injury) ([place]) 04/23/2020  . Tobacco use 04/23/2020  . Unresponsive 11/08/2018  . Syncope, convulsive 11/08/2018  . Hypertension associated with diabetes ([place]) 11/08/2018  . Alcohol withdrawal seizure with complication, with unspecified complication ([place]) 14/97/0263  . Type 2 diabetes mellitus without complication ([place]) 78/58/8502  . Symptomatic anemia 04/15/2018  . Prostate cancer ([place]) 02/27/2014  . Malignant neoplasm of prostate ([place]) 01/09/2014  . Bunion 01/17/2013  . 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 BIOPSY  04/16/2018   Procedure: BIOPSY;  Surgeon: Juanita Craver, MD;  Location: [facility];  Service: Endoscopy;;  . BIOPSY  05/23/2020   Procedure: BIOPSY;  Surgeon: Jackquline Denmark, MD;  Location: [facility];  Service: Gastroenterology;;  EGD and COLON  . biopsy on throat     hx of   . CATARACT EXTRACTION Bilateral    Dr. Quentin Ore  . COLONOSCOPY N/A 05/23/2020   Procedure: COLONOSCOPY;  Surgeon: Jackquline Denmark, MD;  Location: [facility];  Service: Gastroenterology;  Laterality: N/A;  . ESOPHAGOGASTRODUODENOSCOPY Left 04/16/2018   Procedure: ESOPHAGOGASTRODUODENOSCOPY (EGD);  Surgeon: Juanita Craver, MD;  Location: [facility];  Service: Endoscopy;  Laterality: Left;  . ESOPHAGOGASTRODUODENOSCOPY (EGD) WITH PROPOFOL N/A 05/23/2020   Procedure: ESOPHAGOGASTRODUODENOSCOPY (EGD) WITH PROPOFOL;  Surgeon: Jackquline Denmark, MD;  Location: [facility];  Service: Gastroenterology;  Laterality: N/A;  . EYE SURGERY    . FOOT SURGERY    . HOT HEMOSTASIS N/A 04/16/2018   Procedure: HOT HEMOSTASIS (ARGON PLASMA COAGULATION/BICAP);  Surgeon: Juanita Craver, MD;  Location: [facility];  Service:  Endoscopy;  Laterality: N/A;  . HOT HEMOSTASIS N/A 05/23/2020   Procedure: HOT HEMOSTASIS (ARGON PLASMA COAGULATION/BICAP);  Surgeon: Jackquline Denmark, MD;  Location: [facility];  Service: Gastroenterology;  Laterality: N/A;  . LYMPHADENECTOMY Bilateral 02/27/2014   Procedure: BILATERAL LYMPHADENECTOMY;  Surgeon: Alexis Frock, MD;  Location: [facility];  Service: Urology;  Laterality: Bilateral;  . POLYPECTOMY  05/23/2020   Procedure: POLYPECTOMY;  Surgeon: Jackquline Denmark, MD;  Location: [facility];  Service: Gastroenterology;;  . PROSTATE BIOPSY  12/2013   Gleason 4+3=7, volume 31.31 cc  . ROBOT ASSISTED LAPAROSCOPIC RADICAL PROSTATECTOMY N/A 02/27/2014   Procedure: ROBOTIC ASSISTED LAPAROSCOPIC RADICAL PROSTATECTOMY WITH INDOCYANINE GREEN DYE;  Surgeon: Alexis Frock, MD;  Location: [facility];  Service: Urology;  Laterality: N/A;       Family History  Problem Relation Age of Onset  . Heart disease Mother   . Cancer Sister        breast  . Colon cancer Neg Hx   . Esophageal cancer Neg Hx   . Rectal cancer Neg Hx   . Stomach cancer Neg Hx     Social History   Tobacco Use  . Smoking status: Every Day    Packs/day: 0.50    Pack years: 0.00    Types: Cigarettes  . Smokeless tobacco: Never  . Tobacco comments:    smokes a couple every day or two  Vaping Use  . Vaping Use: Never used  Substance Use Topics  . Alcohol use: Not Currently    Alcohol/week: 1.0 standard drink    Types: 1 Cans of beer per week  . Drug use: Not Currently    Types: Marijuana, Cocaine    Comment: past hx approx 30 years ago     Home Medications Prior to Admission medications   Medication Sig Start Date End Date Taking? Authorizing Provider  albuterol (VENTOLIN HFA) 108 (90 Base) MCG/ACT inhaler Inhale 2 puffs into the lungs every 6 (six) hours as needed for wheezing or shortness of breath. 08/21/19   [provider]  amLODipine (NORVASC) 10 MG tablet Take 10 mg by mouth daily. 09/30/19   [provider]  atorvastatin (LIPITOR) 40 MG tablet Take 1 tablet (40 mg total) by mouth daily. Patient taking differently: Take 40 mg by mouth at bedtime. 04/25/20 07/24/20  [place] Donnamarie Poag, DO  carvedilol (COREG) 25 MG tablet Take 1 tablet (25 mg total) by mouth 2 (two) times daily with a meal. Patient taking differently: Take 25 mg by mouth 2 (two) times daily with a meal. Hold if SBP<100, HR<60 05/08/20   [name], Rinka R, MD  Ensure Plus (ENSURE PLUS) LIQD Take 237 mLs by mouth 2 (two) times daily between meals.    [provider]  folic acid (FOLVITE) 1 MG tablet Take 1 tablet (1 mg total) by mouth daily. 05/27/20   Eugenie Filler, MD  iron polysaccharides (NU-IRON) 150 MG capsule Take 1 capsule (150 mg total) by mouth daily. 05/26/20   Eugenie Filler, MD  metFORMIN (GLUCOPHAGE) 500 MG tablet  Take 500 mg by mouth 2 (two) times daily. 02/13/19   [provider]  Multiple Vitamin (MULTIVITAMIN WITH MINERALS) TABS tablet Take 1 tablet by mouth daily. 05/27/20   Eugenie Filler, MD  nicotine (NICODERM CQ - DOSED IN MG/24 HOURS) 21 mg/24hr patch Place 1 patch (21 mg total) onto the skin daily. 05/09/20   [name], Michell Heinrich, MD  pantoprazole (PROTONIX) 40 MG tablet Take 40 mg by mouth daily.    [provider]  polyethylene glycol (MIRALAX / GLYCOLAX) 17 g packet Take 17 g by mouth daily. 05/09/20   [name], Michell Heinrich, MD  senna-docusate (SENOKOT-S) 8.6-50 MG tablet Take 2 tablets by mouth at bedtime. 05/26/20   Eugenie Filler, MD  thiamine 100 MG tablet Take 1 tablet (100 mg total) by mouth daily. 05/27/20   Eugenie Filler, MD  traZODone (DESYREL) 50 MG tablet Take 50 mg by mouth at bedtime as needed for sleep.    [provider]  COMBIVENT RESPIMAT 20-100 MCG/ACT AERS respimat Inhale 1 puff into the lungs every 6 (six) hours as needed for shortness of breath. 06/12/18 08/19/19  [provider]  ferrous sulfate 325 (65 FE) MG tablet Take 1 tablet (325 mg  total) by mouth 2 (two) times daily with a meal. Patient not taking: Reported on 08/19/2019 04/17/18 08/19/19  Dana Allan I, MD  mometasone-formoterol (DULERA) 100-5 MCG/ACT AERO Inhale 2 puffs into the lungs daily. Patient not taking: Reported on 08/19/2019 04/17/18 08/19/19  Dana Allan I, MD  sucralfate (CARAFATE) 1 g tablet Take 1 tablet (1 g total) by mouth 4 (four) times daily. Patient not taking: Reported on 08/19/2019 04/17/18 08/19/19  Dana Allan I, MD  tiotropium (SPIRIVA HANDIHALER) 18 MCG inhalation capsule Place 1 capsule (18 mcg total) into inhaler and inhale daily. Patient not taking: Reported on 08/19/2019 04/17/18 08/19/19  Dana Allan I, MD    Allergies    Penicillins and Penicillins  Review of Systems   Review of Systems  Constitutional:  Negative for chills and fever.  HENT:  Negative for ear pain and sore throat.   Eyes:  Negative for pain and visual disturbance.  Respiratory:  Negative for cough and shortness of breath.   Cardiovascular:  Negative for chest pain and palpitations.  Gastrointestinal:  Negative for abdominal pain and vomiting.  Genitourinary:  Negative for dysuria and hematuria.  Musculoskeletal:  Negative for arthralgias and back pain.  Skin:  Negative for color change and rash.  Neurological:  Negative for seizures, syncope and headaches.  All other systems reviewed and are negative.  Physical Exam Updated Vital Signs BP 120/72   Pulse 64   Temp (!) 97.5 F (36.4 C) (Oral)   Resp 18   Ht 5\' 11"  (1.803 m)   Wt 83.9 kg   SpO2 100%   BMI 25.80 kg/m   Physical Exam Vitals and nursing note reviewed.  Constitutional:      Appearance: He is well-developed.  HENT:     Head: Normocephalic and atraumatic.  Eyes:     Extraocular Movements: Extraocular movements intact.     Conjunctiva/sclera: Conjunctivae normal.     Pupils: Pupils are equal, round, and reactive to light.  Cardiovascular:     Rate and Rhythm: Normal rate and  regular rhythm.     Heart sounds: No murmur heard. Pulmonary:     Effort: Pulmonary effort is normal. No respiratory distress.     Breath sounds: Normal breath sounds.  Abdominal:  Palpations: Abdomen is soft.     Tenderness: There is no abdominal tenderness.  Musculoskeletal:        General: No deformity or signs of injury.     Cervical back: Neck supple.  Skin:    General: Skin is warm and dry.  Neurological:     General: No focal deficit present.     Mental Status: He is alert and oriented to person, place, and time.     Cranial Nerves: No cranial nerve deficit.     Sensory: No sensory deficit.     Motor: No weakness.     Coordination: Coordination normal.  Psychiatric:        Mood and Affect: Mood normal.        Behavior: Behavior normal.    ED Results / Procedures / Treatments   Labs (all labs ordered are listed, but only abnormal results are displayed) Labs Reviewed  BASIC METABOLIC PANEL - Abnormal; Notable for the following components:      Result Value   Potassium 3.4 (*)    Chloride 113 (*)    CO2 16 (*)    Glucose, Bld 117 (*)    Creatinine, Ser 1.51 (*)    Calcium 7.4 (*)    GFR, Estimated 49 (*)    All other components within normal limits  CBC - Abnormal; Notable for the following components:   WBC 3.3 (*)    RBC 2.34 (*)    Hemoglobin 6.7 (*)    HCT 21.6 (*)    All other components within normal limits  HEPATIC FUNCTION PANEL - Abnormal; Notable for the following components:   Total Protein 6.2 (*)    Albumin 2.3 (*)    All other components within normal limits  CBG MONITORING, ED - Abnormal; Notable for the following components:   Glucose-Capillary 114 (*)    All other components within normal limits  POC OCCULT BLOOD, ED - Abnormal; Notable for the following components:   Fecal Occult Bld POSITIVE (*)    All other components within normal limits  RESP PANEL BY RT-PCR (FLU A&B, COVID) ARPGX2  MAGNESIUM  URINALYSIS, ROUTINE W REFLEX  MICROSCOPIC  PREPARE RBC (CROSSMATCH)  TYPE AND SCREEN  TROPONIN I (HIGH SENSITIVITY)  TROPONIN I (HIGH SENSITIVITY)    EKG EKG Interpretation  Date/Time:  Sunday October 05 2020 17:40:10 EDT Ventricular Rate:  60 PR Interval:  168 QRS Duration: 92 QT Interval:  444 QTC Calculation: 444 R Axis:   16 Text Interpretation: Normal sinus rhythm T wave abnormality, consider inferolateral ischemia Abnormal ECG T wave inversions II, aVF, V4-V6 the T wave inversions in II and aVF appear new or slightly worse than priors, and the inversions in V4-V6 are similar to priors Confirmed by Lorre Munroe ([id number]) on 10/05/2020 7:12:40 PM  Radiology CT HEAD WO CONTRAST  Result Date: 10/05/2020 CLINICAL DATA:  Syncope EXAM: CT HEAD WITHOUT CONTRAST TECHNIQUE: Contiguous axial images were obtained from the base of the skull through the vertex without intravenous contrast. COMPARISON:  CT head 09/13/2020 FINDINGS: Brain: Generalized atrophy. Chronic microvascular ischemic change in the white matter. Chronic infarct in the deep white matter on the left unchanged. Chronic infarct in the left pons unchanged. Negative for acute infarct, hemorrhage, mass. Vascular: Negative for hyperdense vessel Skull: Negative Sinuses/Orbits: Paranasal sinuses clear. Bilateral cataract extraction Other: None IMPRESSION: Atrophy and chronic ischemic change. No acute abnormality no change from the prior study. Electronically Signed   By: Franchot Gallo M.D.  On: 10/05/2020 18:32    Procedures .Critical Care  Date/Time: 10/05/2020 9:16 PM Performed by: Arnaldo Natal, MD Authorized by: Arnaldo Natal, MD   Critical care provider statement:    Critical care time (minutes):  45   Critical care time was exclusive of:  Separately billable procedures and treating other patients and teaching time   Critical care was necessary to treat or prevent imminent or life-threatening deterioration of the following conditions: symptomatic anemia  requiring blood transfusion.   Critical care was time spent personally by me on the following activities:  Discussions with consultants, evaluation of patient's response to treatment, examination of patient, ordering and performing treatments and interventions, ordering and review of laboratory studies, ordering and review of radiographic studies, pulse oximetry, re-evaluation of patient's condition, obtaining history from patient or surrogate and review of old charts   I assumed direction of critical care for this patient from another provider in my specialty: no     Care discussed with: admitting provider     Medications Ordered in ED Medications  0.9 %  sodium chloride infusion (has no administration in time range)  pantoprazole (PROTONIX) EC tablet 80 mg (80 mg Oral Given 10/05/20 2104)    ED Course  I have reviewed the triage vital signs and the nursing notes.  Pertinent labs & imaging results that were available during my care of the patient were reviewed by me and considered in my medical decision making (see chart for details).  Clinical Course as of 10/05/20 2117  Nancy Fetter Oct 05, 2020  2021 I spoke with Dr. Hildred Priest of [facility]. Recommends NPO after MN. Oral protonix OK. [AW]  2116 I spoke with [place] regarding patient's admission. [AW]    Clinical Course User Index [AW] Arnaldo Natal, MD   MDM Rules/Calculators/A&P                          [name] presents to the hospital with fall versus syncope.  He was found to be profoundly anemic.  Per chart review, he had gastric AVMs.  He has heme positive stool.  He will be admitted for further work-up and treatment.  Blood transfusion has been ordered. Final Clinical Impression(s) / ED Diagnoses Final diagnoses:  Syncope and collapse  Iron deficiency anemia due to chronic blood loss  Gastrointestinal hemorrhage, unspecified gastrointestinal hemorrhage type    Rx / DC Orders ED Discharge Orders     None         Arnaldo Natal, MD 10/05/20 2118

## 2020-10-06 DIAGNOSIS — I152 Hypertension secondary to endocrine disorders: Secondary | ICD-10-CM | POA: Diagnosis not present

## 2020-10-06 DIAGNOSIS — Z7984 Long term (current) use of oral hypoglycemic drugs: Secondary | ICD-10-CM | POA: Diagnosis not present

## 2020-10-06 DIAGNOSIS — F17210 Nicotine dependence, cigarettes, uncomplicated: Secondary | ICD-10-CM | POA: Diagnosis present

## 2020-10-06 DIAGNOSIS — W1830XA Fall on same level, unspecified, initial encounter: Secondary | ICD-10-CM | POA: Diagnosis present

## 2020-10-06 DIAGNOSIS — I248 Other forms of acute ischemic heart disease: Secondary | ICD-10-CM | POA: Diagnosis present

## 2020-10-06 DIAGNOSIS — R55 Syncope and collapse: Secondary | ICD-10-CM | POA: Diagnosis present

## 2020-10-06 DIAGNOSIS — Y9301 Activity, walking, marching and hiking: Secondary | ICD-10-CM | POA: Diagnosis present

## 2020-10-06 DIAGNOSIS — Z20822 Contact with and (suspected) exposure to covid-19: Secondary | ICD-10-CM | POA: Diagnosis present

## 2020-10-06 DIAGNOSIS — E876 Hypokalemia: Secondary | ICD-10-CM | POA: Diagnosis present

## 2020-10-06 DIAGNOSIS — Z79899 Other long term (current) drug therapy: Secondary | ICD-10-CM | POA: Diagnosis not present

## 2020-10-06 DIAGNOSIS — I69319 Unspecified symptoms and signs involving cognitive functions following cerebral infarction: Secondary | ICD-10-CM | POA: Diagnosis not present

## 2020-10-06 DIAGNOSIS — E785 Hyperlipidemia, unspecified: Secondary | ICD-10-CM | POA: Diagnosis present

## 2020-10-06 DIAGNOSIS — E1169 Type 2 diabetes mellitus with other specified complication: Secondary | ICD-10-CM | POA: Diagnosis present

## 2020-10-06 DIAGNOSIS — K219 Gastro-esophageal reflux disease without esophagitis: Secondary | ICD-10-CM | POA: Diagnosis present

## 2020-10-06 DIAGNOSIS — Z803 Family history of malignant neoplasm of breast: Secondary | ICD-10-CM | POA: Diagnosis not present

## 2020-10-06 DIAGNOSIS — D500 Iron deficiency anemia secondary to blood loss (chronic): Secondary | ICD-10-CM

## 2020-10-06 DIAGNOSIS — Z880 Allergy status to penicillin: Secondary | ICD-10-CM | POA: Diagnosis not present

## 2020-10-06 DIAGNOSIS — I129 Hypertensive chronic kidney disease with stage 1 through stage 4 chronic kidney disease, or unspecified chronic kidney disease: Secondary | ICD-10-CM | POA: Diagnosis present

## 2020-10-06 DIAGNOSIS — K922 Gastrointestinal hemorrhage, unspecified: Secondary | ICD-10-CM | POA: Diagnosis not present

## 2020-10-06 DIAGNOSIS — E113293 Type 2 diabetes mellitus with mild nonproliferative diabetic retinopathy without macular edema, bilateral: Secondary | ICD-10-CM | POA: Diagnosis present

## 2020-10-06 DIAGNOSIS — E1159 Type 2 diabetes mellitus with other circulatory complications: Secondary | ICD-10-CM | POA: Diagnosis not present

## 2020-10-06 DIAGNOSIS — R710 Precipitous drop in hematocrit: Secondary | ICD-10-CM | POA: Diagnosis not present

## 2020-10-06 DIAGNOSIS — Z7289 Other problems related to lifestyle: Secondary | ICD-10-CM | POA: Diagnosis not present

## 2020-10-06 DIAGNOSIS — G40909 Epilepsy, unspecified, not intractable, without status epilepticus: Secondary | ICD-10-CM | POA: Diagnosis present

## 2020-10-06 DIAGNOSIS — E1122 Type 2 diabetes mellitus with diabetic chronic kidney disease: Secondary | ICD-10-CM | POA: Diagnosis present

## 2020-10-06 DIAGNOSIS — Z8546 Personal history of malignant neoplasm of prostate: Secondary | ICD-10-CM | POA: Diagnosis not present

## 2020-10-06 DIAGNOSIS — N1832 Chronic kidney disease, stage 3b: Secondary | ICD-10-CM | POA: Diagnosis present

## 2020-10-06 DIAGNOSIS — Z7952 Long term (current) use of systemic steroids: Secondary | ICD-10-CM | POA: Diagnosis not present

## 2020-10-06 DIAGNOSIS — K5520 Angiodysplasia of colon without hemorrhage: Secondary | ICD-10-CM

## 2020-10-06 DIAGNOSIS — Z8249 Family history of ischemic heart disease and other diseases of the circulatory system: Secondary | ICD-10-CM | POA: Diagnosis not present

## 2020-10-06 DIAGNOSIS — H35033 Hypertensive retinopathy, bilateral: Secondary | ICD-10-CM | POA: Diagnosis present

## 2020-10-06 DIAGNOSIS — I69328 Other speech and language deficits following cerebral infarction: Secondary | ICD-10-CM | POA: Diagnosis not present

## 2020-10-06 DIAGNOSIS — R195 Other fecal abnormalities: Secondary | ICD-10-CM | POA: Diagnosis not present

## 2020-10-06 DIAGNOSIS — I69398 Other sequelae of cerebral infarction: Secondary | ICD-10-CM | POA: Diagnosis not present

## 2020-10-06 DIAGNOSIS — R569 Unspecified convulsions: Secondary | ICD-10-CM | POA: Diagnosis not present

## 2020-10-06 LAB — URINALYSIS, ROUTINE W REFLEX MICROSCOPIC
Bilirubin Urine: NEGATIVE
Glucose, UA: NEGATIVE mg/dL
Ketones, ur: NEGATIVE mg/dL
Leukocytes,Ua: NEGATIVE
Nitrite: NEGATIVE
Protein, ur: 100 mg/dL — AB
Specific Gravity, Urine: 1.01 (ref 1.005–1.030)
pH: 6 (ref 5.0–8.0)

## 2020-10-06 LAB — COMPREHENSIVE METABOLIC PANEL
ALT: 33 U/L (ref 0–44)
AST: 39 U/L (ref 15–41)
Albumin: 2.6 g/dL — ABNORMAL LOW (ref 3.5–5.0)
Alkaline Phosphatase: 54 U/L (ref 38–126)
Anion gap: 6 (ref 5–15)
BUN: 22 mg/dL (ref 8–23)
CO2: 21 mmol/L — ABNORMAL LOW (ref 22–32)
Calcium: 8.6 mg/dL — ABNORMAL LOW (ref 8.9–10.3)
Chloride: 112 mmol/L — ABNORMAL HIGH (ref 98–111)
Creatinine, Ser: 1.51 mg/dL — ABNORMAL HIGH (ref 0.61–1.24)
GFR, Estimated: 49 mL/min — ABNORMAL LOW (ref >60)
Glucose, Bld: 84 mg/dL (ref 70–99)
Potassium: 3.6 mmol/L (ref 3.5–5.1)
Sodium: 139 mmol/L (ref 135–145)
Total Bilirubin: 0.7 mg/dL (ref 0.3–1.2)
Total Protein: 7.2 g/dL (ref 6.5–8.1)

## 2020-10-06 LAB — CBC
HCT: 31.7 % — ABNORMAL LOW (ref 39.0–52.0)
Hemoglobin: 10.4 g/dL — ABNORMAL LOW (ref 13.0–17.0)
MCH: 29.4 pg (ref 26.0–34.0)
MCHC: 32.8 g/dL (ref 30.0–36.0)
MCV: 89.5 fL (ref 80.0–100.0)
Platelets: 164 10*3/uL (ref 150–400)
RBC: 3.54 MIL/uL — ABNORMAL LOW (ref 4.22–5.81)
RDW: 13.4 % (ref 11.5–15.5)
WBC: 4.9 10*3/uL (ref 4.0–10.5)
nRBC: 0 % (ref 0.0–0.2)

## 2020-10-06 LAB — CBG MONITORING, ED
Glucose-Capillary: 90 mg/dL (ref 70–99)
Glucose-Capillary: 97 mg/dL (ref 70–99)

## 2020-10-06 LAB — URINALYSIS, MICROSCOPIC (REFLEX): Bacteria, UA: NONE SEEN

## 2020-10-06 LAB — TSH: TSH: 1.839 u[IU]/mL (ref 0.350–4.500)

## 2020-10-06 LAB — GLUCOSE, CAPILLARY: Glucose-Capillary: 117 mg/dL — ABNORMAL HIGH (ref 70–99)

## 2020-10-06 LAB — PREPARE RBC (CROSSMATCH)

## 2020-10-06 LAB — ETHANOL: Alcohol, Ethyl (B): <10 mg/dL (ref <10)

## 2020-10-06 NOTE — Consult Note (Signed)
Neurology Consultation Reason for Consult: Syncope Referring Physician: Avon Gully, W  CC: Syncope   History is obtained from:patient, neice  HPI: Tanush Drees. is a 75 y.o. male with a history of previous stroke as well as previous seizure in 10/2018 who describes two episodes of syncope, one now, and one about a month ago. His seizure in 2020 was ascribed to possible alchohol withdrawal, though this was unclear at that time.   The patient describes the two most recent episodes as being similar. He gets flashing lights in the right side of his vision followed by loss of conciousness. With the first episode, he has no memory between passing out and then the next thing he knew, he was in the ambulance. With this episode, he has some memory prior to being put in the ambulance. Both episodes were unwitnessed. His niece found him after this episode and states that he was quite confused, appearing dazed. He had loss of urinary continence.      ROS: A 14 point ROS was performed and is negative except as noted in the HPI.   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 He has been smoking an average of 0.50 packs per day. He has never used smokeless tobacco. He reports previous alcohol use of about 1.0 standard drink of alcohol per week. He reports previous drug use. Drugs: Marijuana and Cocaine.   Exam: Current vital signs: BP (!) 155/82 (BP Location: Right Arm)   Pulse 73   Temp 98.3 F (36.8 C) (Oral)   Resp 18   Ht 5\' 11"  (1.803 m)   Wt 83.9 kg   SpO2 98%   BMI 25.80 kg/m  Vital signs in last 24 hours: Temp:  [97.6 F (36.4 C)-99 F (37.2 C)] 98.3 F (36.8 C) (06/20 1523) Pulse Rate:  [72-93] 73 (06/20 1523) Resp:  [12-19] 18 (06/20 1523) BP: (121-175)/(56-115) 155/82 (06/20 1523) SpO2:  [97 %-100 %] 98 % (06/20 1523)   Physical Exam  Constitutional: Appears well-developed and well-nourished.  Psych: Affect appropriate to situation Eyes: No scleral injection HENT: No OP obstruction MSK: no joint deformities.  Cardiovascular: Normal rate and regular rhythm.  Respiratory: Effort normal, non-labored breathing GI: Soft.  No distension. There is no tenderness.  Skin: WDI  Neuro: Mental Status: Patient is awake, alert, oriented to person, place, month, unable to give year Patient is  able to give a clear and coherent history. No signs of aphasia or neglect Cranial Nerves: II: Visual Fields are full. Pupils are equal, round, and reactive to light.   III,IV, VI: EOMI without ptosis or diploplia.  V: Facial sensation is symmetric to temperature VII: Facial movement is symmetric.  VIII: hearing is intact to voice X: Uvula elevates symmetrically XI: Shoulder shrug is symmetric. XII: tongue is midline without atrophy or fasciculations.  Motor: Tone is normal. Bulk is normal. 5/5 strength was present in all four extremities.  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  Especially when considered in the setting of at least one previously witnessed seizure, I think that this merits anti-epileptic therapy. I would repeat MRI and EEG as well.   Recommendations: 1) Keppra 500mg  BID 2) MRI brain  3) EEG   Roland Rack, MD [facility] [phone]  If 7pm- 7am, please page neurology on call as listed in [place].
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
## 2020-10-06 NOTE — Progress Notes (Signed)
NEW ADMISSION NOTE New Admission Note:   Arrival Method: Stretcher  Mental Orientation: A&O Telemetry: [id number] Assessment: Completed Skin: intact IV: see LDA Pain: none Tubes: none Safety Measures: Safety Fall Prevention Plan has been given, discussed and signed Admission: Completed [facility] Orientation: Patient has been orientated to the room, unit and staff.  Family: none present  Orders have been reviewed and implemented. Will continue to monitor the patient. Call light has been placed within reach and bed alarm has been activated.   Mikki Santee, RN

## 2020-10-06 NOTE — Progress Notes (Signed)
PROGRESS NOTE    [name]  ERX:[id number] DOB: Jan 06, 1946 DOA: 10/05/2020 PCP: [name], MD   Brief Narrative:  Gregory Nunez. is a 75 y.o. 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  Patient reports he was walking around his yard, he denies any prodrome to me indicates that he was walking without any symptoms and the next thing he remembers is waking up with EMS and family around him.  Report per EMS noted to be pale, diaphoretic, and bradycardic into the 40s.  In the ED patient was found to be anemic to 6.7 with known history of GI bleed, GI consulted, hospitalist called for admission.  Assessment & Plan:   Principal Problem:   GI bleed Active Problems:   Hypertension associated with diabetes ([place])   Type 2 diabetes mellitus without complication (HCC)   History of CVA (cerebrovascular accident)   Hyperlipidemia associated with type 2 diabetes mellitus ([place])   Alcohol use   Acute onset syncope without prodrome Concerning for seizure versus cardiogenic etiology -Neuro consulted, recommending EEG TSH cortisol and orthostatics -Patient received IV fluids prior to orthostatics which are still remarkable with a 20 point systolic drop upon standing -Bradycardia reported on scene but no issues or events of bradycardia since that time -Consider cardiology work-up if no other symptoms or findings are noted, telemetry ongoing -Of note patient does carry history of alcohol use and abuse per chart review but denies any recent use to myself, unclear if he is within the window of withdrawal which would certainly increase his risks of seizure  Acute symptomatic anemia, rule out GI bleed -Hemoglobin 6.7, transfusion ordered with 2 units overnight -GI  to follow along, appreciate insight and recommendations -Unclear if patient's anemia is related to his syncope as above, less likely given lack of prodrome  Hyperlipidemia History of CVA -Continue home statin   Hypertension -Holding home carvedilol, amlodipine given borderline blood pressures and reported bradycardia prior to admission   GERD -Switching PPI to IV given concern for GI bleed   Hypokalemia 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  No troponin elevation in ED. -Continue to monitor   Diabetes - SSI   DVT prophylaxis:      SCDs  Code Status:              Full  Family Communication: None available  Status is: Inpatient  Dispo: The patient is from: Home              Anticipated d/c is to: Same              Anticipated d/c date is: 24 to 48 hours              Patient currently not medically stable for discharge  Consultants:  Neurology  Procedures:  None planned  Antimicrobials:  None indicated  Subjective: No acute issues or events overnight, feels somewhat improved, somewhat fatigued but otherwise denies chest pain shortness of breath nausea vomiting diarrhea constipation headache fevers or chills.  Objective: Vitals:   10/06/20 0315 10/06/20 0433 10/06/20 0531 10/06/20 0648  BP: (!) 151/83 (!) 152/86 (!) 158/91 (!) 150/64  Pulse: 82 88 93 80  Resp:  18 18 18   Temp:  98.7 F (37.1 C) 99 F (37.2 C) 98.2 F (36.8 C)   TempSrc:  Oral Oral Oral  SpO2: 99% 100% 99% 97%  Weight:      Height:        Intake/Output Summary (Last 24 hours) at 10/06/2020 0820 Last data filed at 10/06/2020 0530 Gross per 24 hour  Intake 766.16 ml  Output 900 ml  Net -133.84 ml   Filed Weights   10/05/20 1736  Weight: 83.9 kg    Examination:  General:  Pleasantly resting in bed, No acute distress. HEENT:  Normocephalic atraumatic.  Sclerae nonicteric, noninjected.  Extraocular movements intact bilaterally. Neck:  Without mass or deformity.  Trachea is midline. Lungs:  Clear to auscultate bilaterally without rhonchi, wheeze, or rales. Heart:  Regular rate and rhythm.  Without murmurs, rubs, or gallops. Abdomen:  Soft, nontender, nondistended.  Without guarding or rebound. Extremities: Without cyanosis, clubbing, edema, or obvious deformity. Vascular:  Dorsalis pedis and posterior tibial pulses palpable bilaterally. Skin:  Warm and dry, no erythema, no ulcerations.   Data Reviewed: I have personally reviewed following labs and imaging studies  CBC: Recent Labs  Lab 10/05/20 1738  WBC 3.3*  HGB 6.7*  HCT 21.6*  MCV 92.3  PLT 630   Basic Metabolic Panel: Recent Labs  Lab 10/05/20 1738  NA 136  K 3.4*  CL 113*  CO2 16*  GLUCOSE 117*  BUN 21  CREATININE 1.51*  CALCIUM 7.4*  MG 1.7   GFR: Estimated Creatinine Clearance: 47.1 mL/min (A) (by C-G formula based on SCr of 1.51 mg/dL (H)). Liver Function Tests: Recent Labs  Lab 10/05/20 1738  AST 35  ALT 29  ALKPHOS 44  BILITOT 0.5  PROT 6.2*  ALBUMIN 2.3*   No results for input(s): LIPASE, AMYLASE in the last 168 hours. No results for input(s): AMMONIA in the last 168 hours. Coagulation Profile: No results for input(s): INR, PROTIME in the last 168 hours. Cardiac Enzymes: No results for input(s): CKTOTAL, CKMB, CKMBINDEX, TROPONINI in the last 168 hours. BNP (last 3 results) No results for input(s): PROBNP in the last 8760 hours. HbA1C: No  results for input(s): HGBA1C in the last 72 hours. CBG: Recent Labs  Lab 10/05/20 1750 10/06/20 0721  GLUCAP 114* 90   Lipid Profile: No results for input(s): CHOL, HDL, LDLCALC, TRIG, CHOLHDL, LDLDIRECT in the last 72 hours. Thyroid Function Tests: No results for input(s): TSH, T4TOTAL, FREET4, T3FREE, THYROIDAB in the last 72 hours. Anemia Panel: No results for input(s): VITAMINB12, FOLATE, FERRITIN, TIBC, IRON, RETICCTPCT in the last 72 hours. Sepsis Labs: No results for input(s): PROCALCITON, LATICACIDVEN in the last 168 hours.  Recent Results (from the past 240 hour(s))  Resp Panel by RT-PCR (Flu A&B, Covid) Nasopharyngeal Swab     Status: None   Collection Time: 10/05/20  8:04 PM   Specimen: Nasopharyngeal Swab; Nasopharyngeal(NP) swabs in vial transport medium  Result Value Ref Range Status   SARS Coronavirus 2 by RT PCR NEGATIVE NEGATIVE Final    Comment: (NOTE) SARS-CoV-2 target nucleic acids are NOT  DETECTED.  The SARS-CoV-2 RNA is generally detectable in upper respiratory specimens during the acute phase of infection. The lowest concentration of SARS-CoV-2 viral copies this assay can detect is 138 copies/mL. A negative result does not preclude SARS-Cov-2 infection and should not be used as the sole basis for treatment or other patient management decisions. A negative result may occur with  improper specimen collection/handling, submission of specimen other than nasopharyngeal swab, presence of viral mutation(s) within the areas targeted by this assay, and inadequate number of viral copies(<138 copies/mL). A negative result must be combined with clinical observations, patient history, and epidemiological information. The expected result is Negative.  Fact Sheet for Patients:  EntrepreneurPulse.com.au  Fact Sheet for Healthcare Providers:  IncredibleEmployment.be  This test is no t yet approved or cleared by the [place]  FDA and  has been authorized for detection and/or diagnosis of SARS-CoV-2 by FDA under an Emergency Use Authorization (EUA). This EUA will remain  in effect (meaning this test can be used) for the duration of the COVID-19 declaration under Section 564(b)(1) of the Act, 21 U.S.C.section 360bbb-3(b)(1), unless the authorization is terminated  or revoked sooner.       Influenza A by PCR NEGATIVE NEGATIVE Final   Influenza B by PCR NEGATIVE NEGATIVE Final    Comment: (NOTE) The Xpert Xpress SARS-CoV-2/FLU/RSV plus assay is intended as an aid in the diagnosis of influenza from Nasopharyngeal swab specimens and should not be used as a sole basis for treatment. Nasal washings and aspirates are unacceptable for Xpert Xpress SARS-CoV-2/FLU/RSV testing.  Fact Sheet for Patients: EntrepreneurPulse.com.au  Fact Sheet for Healthcare Providers: IncredibleEmployment.be  This test is not yet approved or cleared by the [place] FDA and has been authorized for detection and/or diagnosis of SARS-CoV-2 by FDA under an Emergency Use Authorization (EUA). This EUA will remain in effect (meaning this test can be used) for the duration of the COVID-19 declaration under Section 564(b)(1) of the Act, 21 U.S.C. section 360bbb-3(b)(1), unless the authorization is terminated or revoked.  Performed at [facility], [place] [address], [place], [place] [address]          Radiology Studies: DG Chest 2 View  Result Date: 10/05/2020 CLINICAL DATA:  Syncope EXAM: CHEST - 2 VIEW COMPARISON:  11/07/2018 FINDINGS: The heart size and mediastinal contours are within normal limits. Both lungs are clear. The visualized skeletal structures are unremarkable. IMPRESSION: No active cardiopulmonary disease. Electronically Signed   By: Franchot Gallo M.D.   On: 10/05/2020 19:24   CT HEAD WO CONTRAST  Result Date: 10/05/2020 CLINICAL DATA:  Syncope EXAM: CT HEAD WITHOUT  CONTRAST TECHNIQUE: Contiguous axial images were obtained from the base of the skull through the vertex without intravenous contrast. COMPARISON:  CT head 09/13/2020 FINDINGS: Brain: Generalized atrophy. Chronic microvascular ischemic change in the white matter. Chronic infarct in the deep white matter on the left unchanged. Chronic infarct in the left pons unchanged. Negative for acute infarct, hemorrhage, mass. Vascular: Negative for hyperdense vessel Skull: Negative Sinuses/Orbits: Paranasal sinuses clear. Bilateral cataract extraction Other: None IMPRESSION: Atrophy and chronic ischemic change. No acute abnormality no change from the prior study. Electronically Signed   By: Franchot Gallo M.D.   On: 10/05/2020 18:32        Scheduled Meds:  atorvastatin  40 mg Oral QHS   feeding supplement  237 mL Oral BID BM   folic acid  1 mg Oral Daily   insulin aspart  0-9 Units Subcutaneous  TID WC   iron polysaccharides  150 mg Oral Daily   multivitamin with minerals  1 tablet Oral Daily   nicotine  21 mg Transdermal Daily   pantoprazole (PROTONIX) IV  40 mg Intravenous Q12H   polyethylene glycol  17 g Oral Daily   potassium chloride  40 mEq Oral Once   sodium chloride flush  3 mL Intravenous Q12H   thiamine  100 mg Oral Daily   Continuous Infusions:  sodium chloride     lactated ringers 125 mL/hr at 10/06/20 0647     LOS: 0 days   Time spent: 77min  Mykelti Goldenstein C Klein Willcox, DO [facility]  If 7PM-7AM, please contact night-coverage [email or web address]  10/06/2020, 8:20 AM

## 2020-10-07 ENCOUNTER — Encounter (HOSPITAL_COMMUNITY): Payer: Self-pay | Admitting: Internal Medicine

## 2020-10-07 ENCOUNTER — Inpatient Hospital Stay (HOSPITAL_COMMUNITY)
Admit: 2020-10-07 | Discharge: 2020-10-07 | Disposition: A | Discharge status: Home / Self Care | Payer: Medicare Other | Attending: Neurology | Admitting: Neurology

## 2020-10-07 ENCOUNTER — Inpatient Hospital Stay (HOSPITAL_COMMUNITY): Payer: Medicare Other | Admitting: Anesthesiology

## 2020-10-07 ENCOUNTER — Encounter (HOSPITAL_COMMUNITY)
Admission: EM | Disposition: A | Discharge status: Home / Self Care | Payer: Self-pay | Source: Home / Self Care | Attending: Internal Medicine

## 2020-10-07 ENCOUNTER — Inpatient Hospital Stay (HOSPITAL_COMMUNITY): Payer: Medicare Other

## 2020-10-07 DIAGNOSIS — D500 Iron deficiency anemia secondary to blood loss (chronic): Secondary | ICD-10-CM | POA: Diagnosis not present

## 2020-10-07 DIAGNOSIS — R569 Unspecified convulsions: Secondary | ICD-10-CM | POA: Diagnosis not present

## 2020-10-07 DIAGNOSIS — R710 Precipitous drop in hematocrit: Secondary | ICD-10-CM | POA: Diagnosis not present

## 2020-10-07 DIAGNOSIS — R55 Syncope and collapse: Secondary | ICD-10-CM

## 2020-10-07 DIAGNOSIS — R195 Other fecal abnormalities: Secondary | ICD-10-CM

## 2020-10-07 DIAGNOSIS — K922 Gastrointestinal hemorrhage, unspecified: Secondary | ICD-10-CM | POA: Diagnosis not present

## 2020-10-07 DIAGNOSIS — Z7289 Other problems related to lifestyle: Secondary | ICD-10-CM | POA: Diagnosis not present

## 2020-10-07 DIAGNOSIS — K5520 Angiodysplasia of colon without hemorrhage: Secondary | ICD-10-CM | POA: Diagnosis not present

## 2020-10-07 HISTORY — PX: ENTEROSCOPY: SHX5533

## 2020-10-07 LAB — CBC
HCT: 31.1 % — ABNORMAL LOW (ref 39.0–52.0)
Hemoglobin: 10.5 g/dL — ABNORMAL LOW (ref 13.0–17.0)
MCH: 29.2 pg (ref 26.0–34.0)
MCHC: 33.8 g/dL (ref 30.0–36.0)
MCV: 86.4 fL (ref 80.0–100.0)
Platelets: 167 10*3/uL (ref 150–400)
RBC: 3.6 MIL/uL — ABNORMAL LOW (ref 4.22–5.81)
RDW: 13.5 % (ref 11.5–15.5)
WBC: 4.1 10*3/uL (ref 4.0–10.5)
nRBC: 0 % (ref 0.0–0.2)

## 2020-10-07 LAB — TYPE AND SCREEN
ABO/RH(D): O POS
Antibody Screen: NEGATIVE
Unit division: 0
Unit division: 0

## 2020-10-07 LAB — BASIC METABOLIC PANEL
Anion gap: 8 (ref 5–15)
BUN: 25 mg/dL — ABNORMAL HIGH (ref 8–23)
CO2: 23 mmol/L (ref 22–32)
Calcium: 8.9 mg/dL (ref 8.9–10.3)
Chloride: 106 mmol/L (ref 98–111)
Creatinine, Ser: 1.73 mg/dL — ABNORMAL HIGH (ref 0.61–1.24)
GFR, Estimated: 41 mL/min — ABNORMAL LOW (ref >60)
Glucose, Bld: 91 mg/dL (ref 70–99)
Potassium: 3.6 mmol/L (ref 3.5–5.1)
Sodium: 137 mmol/L (ref 135–145)

## 2020-10-07 LAB — GLUCOSE, CAPILLARY
Glucose-Capillary: 210 mg/dL — ABNORMAL HIGH (ref 70–99)
Glucose-Capillary: 86 mg/dL (ref 70–99)
Glucose-Capillary: 83 mg/dL (ref 70–99)
Glucose-Capillary: 77 mg/dL (ref 70–99)
Glucose-Capillary: 138 mg/dL — ABNORMAL HIGH (ref 70–99)
Glucose-Capillary: 198 mg/dL — ABNORMAL HIGH (ref 70–99)

## 2020-10-07 LAB — BPAM RBC
Blood Product Expiration Date: 202207202359
Blood Product Expiration Date: 202207222359
ISSUE DATE / TIME: 202206192224
ISSUE DATE / TIME: 202206200228
Unit Type and Rh: 5100
Unit Type and Rh: 5100

## 2020-10-07 LAB — CORTISOL-AM, BLOOD: Cortisol - AM: 10.5 ug/dL (ref 6.7–22.6)

## 2020-10-07 SURGERY — ENTEROSCOPY
Anesthesia: Monitor Anesthesia Care

## 2020-10-07 MED ORDER — PHENYLEPHRINE 40 MCG/ML (10ML) SYRINGE FOR IV PUSH (FOR BLOOD PRESSURE SUPPORT)
PREFILLED_SYRINGE | INTRAVENOUS | Status: DC | PRN
  Administered 2020-10-07 (×2): 80 ug via INTRAVENOUS

## 2020-10-07 MED ORDER — LEVETIRACETAM IN NACL 500 MG/100ML IV SOLN
500.0000 mg | Freq: Two times a day (BID) | INTRAVENOUS | Status: DC
  Administered 2020-10-07 – 2020-10-08 (×4): 500 mg via INTRAVENOUS
  Filled 2020-10-07 (×6): qty 100

## 2020-10-07 MED ORDER — PROPOFOL 10 MG/ML IV BOLUS
INTRAVENOUS | Status: DC | PRN
  Administered 2020-10-07: 20 mg via INTRAVENOUS
  Administered 2020-10-07: 15 mg via INTRAVENOUS
  Administered 2020-10-07: 20 mg via INTRAVENOUS

## 2020-10-07 MED ORDER — LACTATED RINGERS IV SOLN: INTRAVENOUS | Status: DC

## 2020-10-07 MED ORDER — PANTOPRAZOLE SODIUM 40 MG PO TBEC
40.0000 mg | DELAYED_RELEASE_TABLET | Freq: Every day | ORAL | Status: DC
  Administered 2020-10-08: 40 mg via ORAL
  Filled 2020-10-07: qty 1

## 2020-10-07 MED ORDER — LIDOCAINE 2% (20 MG/ML) 5 ML SYRINGE
INTRAMUSCULAR | Status: DC | PRN
  Administered 2020-10-07: 50 mg via INTRAVENOUS

## 2020-10-07 MED ORDER — PROPOFOL 500 MG/50ML IV EMUL
INTRAVENOUS | Status: DC | PRN
  Administered 2020-10-07: 125 ug/kg/min via INTRAVENOUS

## 2020-10-07 NOTE — Progress Notes (Signed)
EEG complete - results pending 

## 2020-10-07 NOTE — Progress Notes (Signed)
PROGRESS NOTE    Gregory Nunez.  TWS:[id number] DOB: Dec 31, 1945 DOA: 10/05/2020 PCP: [name], MD   Brief Narrative:  Gregory Nitschke. is a 75 y.o. 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  Patient reports he was walking around his yard, he denies any prodrome to me indicates that he was walking without any symptoms and the next thing he remembers is waking up with EMS and family around him.  Report per EMS noted to be pale, diaphoretic, and bradycardic into the 40s.  In the ED patient was found to be anemic to 6.7 with known history of GI bleed, GI consulted, hospitalist called for admission.  Assessment & Plan:   Principal Problem:   GI bleed Active Problems:   Decreased hemoglobin   Hypertension associated with diabetes ([place])   Type 2 diabetes mellitus without complication (HCC)   History of CVA (cerebrovascular accident)   Hyperlipidemia associated with type 2 diabetes mellitus ([place])   Alcohol use   Angiodysplasia of intestine  Acute onset syncope without prodrome Concerning for seizure versus cardiogenic etiology -Neuro consulted, agree patient is high risk for breakthrough seizure given history, previously witnessed seizure currently recommending Keppra 500 twice daily, EEG and MRI -TSH cortisol unremarkable -Bradycardia reported on scene but no issues or events of bradycardia since that time -Consider cardiology work-up if no other symptoms or findings are noted, telemetry ongoing -Of note patient does carry history of alcohol use and abuse per chart review but denies any recent use to myself, unclear if he is within the window of withdrawal which would certainly increase his risks of seizure  Acute symptomatic anemia, rule out GI  bleed -Hemoglobin 6.7 at admission, status post 2 unit PRBC repeat hemoglobin today 10.5 -GI to follow along, upper endoscopy planned later today -Unclear if patient's anemia is related to his syncope as above, less likely given lack of prodrome  Hyperlipidemia History of CVA -Continue home statin   Hypertension -Holding home carvedilol, amlodipine given borderline blood pressures and reported bradycardia prior to admission   GERD -Switching PPI to IV given concern for GI bleed   Hypokalemia 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 after admission, not collected at intake -Continue CIWA/Ativan protocol   EKG changes > As above some increased T wave inversion, likely in the setting of increased demand with his anemia.  No troponin elevation in ED. -Continue to monitor   Diabetes - SSI   DVT prophylaxis: SCDs  Code Status: Full  Family Communication: None available  Status is: Inpatient  Dispo: The patient is from: Home              Anticipated d/c is to: Same              Anticipated d/c date is: 24 to 48 hours              Patient currently not medically stable for discharge  Consultants:  Neurology  Procedures:  None planned  Antimicrobials:  None indicated  Subjective: No acute issues or events overnight, feels somewhat improved, somewhat fatigued but otherwise denies chest pain shortness of breath nausea vomiting diarrhea constipation headache fevers or chills.  Objective: Vitals:   10/06/20 2125 10/07/20 0028 10/07/20 0442 10/07/20 0504  BP: (!) 158/85 (!) 160/83  (!) 158/83   Pulse: 75 80  69  Resp: 14 18  14   Temp: 98.5 F (36.9 C) 98.9 F (37.2 C)  98.9 F (37.2 C)  TempSrc: Oral Oral  Oral  SpO2: 95% 98%  98%  Weight:   76.1 kg   Height:        Intake/Output Summary (Last 24 hours) at 10/07/2020 0732 Last data filed at 10/07/2020 0200 Gross per 24 hour  Intake --  Output 725 ml  Net -725 ml    Filed Weights   10/05/20 1736 10/07/20 0442  Weight: 83.9 kg 76.1 kg    Examination:  General:  Pleasantly resting in bed, No acute distress. HEENT:  Normocephalic atraumatic.  Sclerae nonicteric, noninjected.  Extraocular movements intact bilaterally. Neck:  Without mass or deformity.  Trachea is midline. Lungs:  Clear to auscultate bilaterally without rhonchi, wheeze, or rales. Heart:  Regular rate and rhythm.  Without murmurs, rubs, or gallops. Abdomen:  Soft, nontender, nondistended.  Without guarding or rebound. Extremities: Without cyanosis, clubbing, edema, or obvious deformity. Vascular:  Dorsalis pedis and posterior tibial pulses palpable bilaterally. Skin:  Warm and dry, no erythema, no ulcerations.   Data Reviewed: I have personally reviewed following labs and imaging studies  CBC: Recent Labs  Lab 10/05/20 1738 10/06/20 0500 10/07/20 0345  WBC 3.3* 4.9 4.1  HGB 6.7* 10.4* 10.5*  HCT 21.6* 31.7* 31.1*  MCV 92.3 89.5 86.4  PLT 183 164 846    Basic Metabolic Panel: Recent Labs  Lab 10/05/20 1738 10/06/20 0500 10/07/20 0345  NA 136 139 137  K 3.4* 3.6 3.6  CL 113* 112* 106  CO2 16* 21* 23  GLUCOSE 117* 84 91  BUN 21 22 25*  CREATININE 1.51* 1.51* 1.73*  CALCIUM 7.4* 8.6* 8.9  MG 1.7  --   --     GFR: Estimated Creatinine Clearance: 41.1 mL/min (A) (by C-G formula based on SCr of 1.73 mg/dL (H)). Liver Function Tests: Recent Labs  Lab 10/05/20 1738 10/06/20 0500  AST 35 39  ALT 29 33  ALKPHOS 44 54  BILITOT 0.5 0.7  PROT 6.2* 7.2  ALBUMIN 2.3* 2.6*    No results for input(s): LIPASE, AMYLASE in the last  168 hours. No results for input(s): AMMONIA in the last 168 hours. Coagulation Profile: No results for input(s): INR, PROTIME in the last 168 hours. Cardiac Enzymes: No results for input(s): CKTOTAL, CKMB, CKMBINDEX, TROPONINI in the last 168 hours. BNP (last 3 results) No results for input(s): PROBNP in the last 8760 hours. HbA1C: No results for input(s): HGBA1C in the last 72 hours. CBG: Recent Labs  Lab 10/06/20 0721 10/06/20 1256 10/06/20 1747 10/06/20 2249 10/07/20 0457  GLUCAP 90 97 210* 117* 86    Lipid Profile: No results for input(s): CHOL, HDL, LDLCALC, TRIG, CHOLHDL, LDLDIRECT in the last 72 hours. Thyroid Function Tests: Recent Labs    10/06/20 1607  TSH 1.839   Anemia Panel: No results for input(s): VITAMINB12, FOLATE, FERRITIN, TIBC, IRON, RETICCTPCT in the last 72 hours. Sepsis Labs: No results for input(s): PROCALCITON, LATICACIDVEN in the last 168 hours.  Recent Results (from the past  240 hour(s))  Resp Panel by RT-PCR (Flu A&B, Covid) Nasopharyngeal Swab     Status: None   Collection Time: 10/05/20  8:04 PM   Specimen: Nasopharyngeal Swab; Nasopharyngeal(NP) swabs in vial transport medium  Result Value Ref Range Status   SARS Coronavirus 2 by RT PCR NEGATIVE NEGATIVE Final    Comment: (NOTE) SARS-CoV-2 target nucleic acids are NOT DETECTED.  The SARS-CoV-2 RNA is generally detectable in upper respiratory specimens during the acute phase of infection. The lowest concentration of SARS-CoV-2 viral copies this assay can detect is 138 copies/mL. A negative result does not preclude SARS-Cov-2 infection and should not be used as the sole basis for treatment or other patient management decisions. A negative result may occur with  improper specimen collection/handling, submission of specimen other than nasopharyngeal swab, presence of viral mutation(s) within the areas targeted by this assay, and inadequate number of viral copies(<138 copies/mL). A  negative result must be combined with clinical observations, patient history, and epidemiological information. The expected result is Negative.  Fact Sheet for Patients:  EntrepreneurPulse.com.au  Fact Sheet for Healthcare Providers:  IncredibleEmployment.be  This test is no t yet approved or cleared by the [place] FDA and  has been authorized for detection and/or diagnosis of SARS-CoV-2 by FDA under an Emergency Use Authorization (EUA). This EUA will remain  in effect (meaning this test can be used) for the duration of the COVID-19 declaration under Section 564(b)(1) of the Act, 21 U.S.C.section 360bbb-3(b)(1), unless the authorization is terminated  or revoked sooner.       Influenza A by PCR NEGATIVE NEGATIVE Final   Influenza B by PCR NEGATIVE NEGATIVE Final    Comment: (NOTE) The Xpert Xpress SARS-CoV-2/FLU/RSV plus assay is intended as an aid in the diagnosis of influenza from Nasopharyngeal swab specimens and should not be used as a sole basis for treatment. Nasal washings and aspirates are unacceptable for Xpert Xpress SARS-CoV-2/FLU/RSV testing.  Fact Sheet for Patients: EntrepreneurPulse.com.au  Fact Sheet for Healthcare Providers: IncredibleEmployment.be  This test is not yet approved or cleared by the [place] FDA and has been authorized for detection and/or diagnosis of SARS-CoV-2 by FDA under an Emergency Use Authorization (EUA). This EUA will remain in effect (meaning this test can be used) for the duration of the COVID-19 declaration under Section 564(b)(1) of the Act, 21 U.S.C. section 360bbb-3(b)(1), unless the authorization is terminated or revoked.  Performed at [facility], [place] [address], [place], [place] [address]           Radiology Studies: DG Chest 2 View  Result Date: 10/05/2020 CLINICAL DATA:  Syncope EXAM: CHEST - 2 VIEW COMPARISON:  11/07/2018  FINDINGS: The heart size and mediastinal contours are within normal limits. Both lungs are clear. The visualized skeletal structures are unremarkable. IMPRESSION: No active cardiopulmonary disease. Electronically Signed   By: Franchot Gallo M.D.   On: 10/05/2020 19:24   CT HEAD WO CONTRAST  Result Date: 10/05/2020 CLINICAL DATA:  Syncope EXAM: CT HEAD WITHOUT CONTRAST TECHNIQUE: Contiguous axial images were obtained from the base of the skull through the vertex without intravenous contrast. COMPARISON:  CT head 09/13/2020 FINDINGS: Brain: Generalized atrophy. Chronic microvascular ischemic change in the white matter. Chronic infarct in the deep white matter on the left unchanged. Chronic infarct in the left pons unchanged. Negative for acute infarct, hemorrhage, mass. Vascular: Negative for hyperdense vessel Skull: Negative Sinuses/Orbits: Paranasal sinuses clear. Bilateral cataract extraction Other: None IMPRESSION: Atrophy and chronic ischemic change.  No acute abnormality no change from the prior study. Electronically Signed   By: Franchot Gallo M.D.   On: 10/05/2020 18:32     Scheduled Meds:  atorvastatin  40 mg Oral QHS   feeding supplement  237 mL Oral BID BM   folic acid  1 mg Oral Daily   insulin aspart  0-9 Units Subcutaneous TID WC   iron polysaccharides  150 mg Oral Daily   multivitamin with minerals  1 tablet Oral Daily   nicotine  21 mg Transdermal Daily   pantoprazole (PROTONIX) IV  40 mg Intravenous Q12H   polyethylene glycol  17 g Oral Daily   potassium chloride  40 mEq Oral Once   sodium chloride flush  3 mL Intravenous Q12H   thiamine  100 mg Oral Daily   Continuous Infusions:  sodium chloride     levETIRAcetam 500 mg (10/07/20 0441)     LOS: 1 day   Time spent: 13min  Jodeci Rini C Himani Corona, DO [facility]  If 7PM-7AM, please contact night-coverage [email or web address]  10/07/2020, 7:32 AM

## 2020-10-07 NOTE — Procedures (Signed)
Patient Name: Gregory Nunez.  MRN: [id number]  Epilepsy Attending: Lora Havens  Referring Physician/Provider: Dr. Kathrynn Speed Date: 10/07/2020 Duration: 31.48 minutes  Patient history: 75 year old male with recurrent episodes of right hemispheric visual scintillations followed by loss of consciousness.  EEG to evaluate for seizures.  Level of alertness: Awake  AEDs during EEG study: Keppra  Technical aspects: This EEG study was done with scalp electrodes positioned according to the 10-20 International system of electrode placement. Electrical activity was acquired at a sampling rate of 500Hz  and reviewed with a high frequency filter of 70Hz  and a low frequency filter of 1Hz . EEG data were recorded continuously and digitally stored.   Description: The posterior dominant rhythm consists of 9-10 Hz activity of moderate voltage (25-35 uV) seen predominantly in posterior head regions, symmetric and reactive to eye opening and eye closing. EEG showed intermittent 3 to 6 Hz theta-delta slowing in left temporal region.  Hyperventilation and photic stimulation were not performed.     ABNORMALITY - Intermittent slow, left temporal region  IMPRESSION: This study is suggestive of nonspecific cortical dysfunction in left temporal region.  No seizures or epileptiform discharges were seen throughout the recording.  Gregory Nunez

## 2020-10-07 NOTE — Progress Notes (Signed)
Pt is unavailable for EEG due to being at another procedure. Will attempt later when schedule permits

## 2020-10-07 NOTE — Anesthesia Preprocedure Evaluation (Signed)
Anesthesia Evaluation  Patient identified by MRN, date of birth, ID band Patient confused    Reviewed: Allergy & Precautions, NPO status , Patient's Chart, lab work & pertinent test results  Airway Mallampati: I  TM Distance: >3 FB Neck ROM: Full    Dental no notable dental hx. (+) Edentulous Upper, Edentulous Lower, Dental Advisory Given   Pulmonary Current Smoker and Patient abstained from smoking.,    Pulmonary exam normal breath sounds clear to auscultation       Cardiovascular hypertension, Pt. on medications pulmonary hypertensionNormal cardiovascular exam Rhythm:Regular Rate:Normal     Neuro/Psych PSYCHIATRIC DISORDERS Depression Pt confused since CVA CVA, Residual Symptoms    GI/Hepatic Neg liver ROS, GERD  ,  Endo/Other  diabetes  Renal/GU      Musculoskeletal negative musculoskeletal ROS (+)   Abdominal   Peds  Hematology  (+) Blood dyscrasia, anemia , Hgb 6.9   Anesthesia Other Findings   Reproductive/Obstetrics                             Anesthesia Physical  Anesthesia Plan  ASA: 3  Anesthesia Plan: MAC   Post-op Pain Management:    Induction: Intravenous  PONV Risk Score and Plan: Treatment may vary due to age or medical condition  Airway Management Planned: Nasal Cannula, Natural Airway and Simple Face Mask  Additional Equipment: None  Intra-op Plan:   Post-operative Plan:   Informed Consent: I have reviewed the patients History and Physical, chart, labs and discussed the procedure including the risks, benefits and alternatives for the proposed anesthesia with the patient or authorized representative who has indicated his/her understanding and acceptance.     Dental advisory given  Plan Discussed with: Anesthesiologist  Anesthesia Plan Comments:         Anesthesia Quick Evaluation

## 2020-10-07 NOTE — Anesthesia Procedure Notes (Signed)
Procedure Name: MAC Date/Time: 10/07/2020 11:11 AM Performed by: Mariea Clonts, CRNA Pre-anesthesia Checklist: Patient identified, Emergency Drugs available, Suction available, Patient being monitored and Timeout performed Oxygen Delivery Method: Nasal cannula and Simple face mask

## 2020-10-07 NOTE — Anesthesia Postprocedure Evaluation (Signed)
Anesthesia Post Note  Patient: [name].  Procedure(s) Performed: ENTEROSCOPY     Patient location during evaluation: PACU Anesthesia Type: MAC Level of consciousness: awake and alert Pain management: pain level controlled Vital Signs Assessment: post-procedure vital signs reviewed and stable Respiratory status: spontaneous breathing, nonlabored ventilation, respiratory function stable and patient connected to nasal cannula oxygen Cardiovascular status: stable and blood pressure returned to baseline Postop Assessment: no apparent nausea or vomiting Anesthetic complications: no   No notable events documented.  Last Vitals:  Vitals:   10/07/20 1024 10/07/20 1133  BP: (!) 163/91 (!) 101/49  Pulse: 70 69  Resp: 16 16  Temp: 37.2 C 36.5 C  SpO2: 100% 99%    Last Pain:  Vitals:   10/07/20 1133  TempSrc: Axillary  PainSc: Asleep                 Patton Rabinovich

## 2020-10-07 NOTE — Interval H&P Note (Signed)
History and Physical Interval Note:  10/07/2020 11:10 AM  Gregory Nunez.  has presented today for surgery, with the diagnosis of Anemia heme positive.  The various methods of treatment have been discussed with the patient and family. After consideration of risks, benefits and other options for treatment, the patient has consented to  Procedure(s): ENTEROSCOPY (N/A) as a surgical intervention.  The patient's history has been reviewed, patient examined, no change in status, stable for surgery.  I have reviewed the patient's chart and labs.  Questions were answered to the patient's satisfaction.     Silvano Rusk

## 2020-10-07 NOTE — Transfer of Care (Signed)
Immediate Anesthesia Transfer of Care Note  Patient: Gregory Nunez.  Procedure(s) Performed: ENTEROSCOPY  Patient Location: Endoscopy Unit  Anesthesia Type:MAC  Level of Consciousness: awake, alert  and oriented  Airway & Oxygen Therapy: Patient Spontanous Breathing and Patient connected to nasal cannula oxygen  Post-op Assessment: Report given to RN, Post -op Vital signs reviewed and stable and Patient moving all extremities X 4  Post vital signs: Reviewed and stable  Last Vitals:  Vitals Value Taken Time  BP 101/49 10/07/20 1133  Temp    Pulse 73 10/07/20 1135  Resp 28 10/07/20 1135  SpO2 99 % 10/07/20 1135  Vitals shown include unvalidated device data.  Last Pain:  Vitals:   10/07/20 1133  TempSrc:   PainSc: Asleep         Complications: No notable events documented.

## 2020-10-07 NOTE — Op Note (Signed)
[facility] Patient Name: Gregory Nunez Procedure Date : 10/07/2020 MRN: [id number] Attending MD: [name] , MD Date of Birth: 1945-09-11 CSN: [id number] Age: 75 Admit Type: Inpatient Procedure:                Small bowel enteroscopy Indications:              Iron deficiency anemia secondary to chronic blood                            loss, Gastrointestinal occult blood loss (syncope,                            decreased Hgb, heme + and hx upper GI AVM's) Providers:                [name], MD, Jeanella Cara, RN,                            Tyna Jaksch Technician Referring MD:              Medicines:                Propofol per Anesthesia, Monitored Anesthesia Care Complications:            No immediate complications. Estimated Blood Loss:     Estimated blood loss: none. Procedure:                Pre-Anesthesia Assessment:                           - Prior to the procedure, a History and Physical                            was performed, and patient medications and                            allergies were reviewed. The patient's tolerance of                            previous anesthesia was also reviewed. The risks                            and benefits of the procedure and the sedation                            options and risks were discussed with the patient.                            All questions were answered, and informed consent                            was obtained. Prior Anticoagulants: The patient has                            taken no previous anticoagulant or antiplatelet  agents. ASA Grade Assessment: III - A patient with                            severe systemic disease. After reviewing the risks                            and benefits, the patient was deemed in                            satisfactory condition to undergo the procedure.                           After obtaining informed consent, the  endoscope was                            passed under direct vision. Throughout the                            procedure, the patient's blood pressure, pulse, and                            oxygen saturations were monitored continuously. The                            [id number]-Q180 ([id number]) Olympus enteroscope was                            introduced through the mouth and advanced to the                            proximal jejunum. The small bowel enteroscopy was                            accomplished without difficulty. The patient                            tolerated the procedure well. Scope In: Scope Out: Findings:      The esophagus was normal.      The stomach was normal.      There was no evidence of significant pathology in the entire examined       duodenum.      There was no evidence of significant pathology in the proximal jejunum. Impression:               - Normal esophagus.                           - Normal stomach.                           - Normal examined duodenum.                           - The examined portion of the jejunum was normal.                           -  No specimens collected. NO ADDITOONAL AVMS SEEN -                            HAD SEVERAL ABLATED 05/2020 Recommendation:           - Return patient to hospital ward for ongoing care.                           - Daily oral PPI - ordered                           No need for outpatient GI f/u                           Home today ok from GI perspective                           signing off                           Please resume all necessary anti-PLT agents - no                            contraindication - I do not think we know he had a                            GI bleed as opposed to decreased Hgb from other                            causes Procedure Code(s):        --- Professional ---                           [phone], Small intestinal endoscopy, enteroscopy                            beyond second  portion of duodenum, not including                            ileum; diagnostic, including collection of                            specimen(s) by brushing or washing, when performed                            (separate procedure) Diagnosis Code(s):        --- Professional ---                           D50.0, Iron deficiency anemia secondary to blood                            loss (chronic)                           R19.5, Other fecal abnormalities CPT copyright 2019 [employer]. All rights reserved. The codes documented in this report are preliminary and upon coder review may  be revised to meet current compliance requirements. [name], MD 10/07/2020 11:39:04 AM This report has been signed electronically. Number of Addenda: 0

## 2020-10-08 DIAGNOSIS — K922 Gastrointestinal hemorrhage, unspecified: Secondary | ICD-10-CM | POA: Diagnosis not present

## 2020-10-08 DIAGNOSIS — Z7289 Other problems related to lifestyle: Secondary | ICD-10-CM

## 2020-10-08 DIAGNOSIS — R569 Unspecified convulsions: Secondary | ICD-10-CM

## 2020-10-08 DIAGNOSIS — E1159 Type 2 diabetes mellitus with other circulatory complications: Secondary | ICD-10-CM | POA: Diagnosis not present

## 2020-10-08 DIAGNOSIS — I152 Hypertension secondary to endocrine disorders: Secondary | ICD-10-CM | POA: Diagnosis not present

## 2020-10-08 LAB — CBC
HCT: 32.6 % — ABNORMAL LOW (ref 39.0–52.0)
Hemoglobin: 11 g/dL — ABNORMAL LOW (ref 13.0–17.0)
MCH: 29.3 pg (ref 26.0–34.0)
MCHC: 33.7 g/dL (ref 30.0–36.0)
MCV: 86.9 fL (ref 80.0–100.0)
Platelets: 158 10*3/uL (ref 150–400)
RBC: 3.75 MIL/uL — ABNORMAL LOW (ref 4.22–5.81)
RDW: 13.5 % (ref 11.5–15.5)
WBC: 3.6 10*3/uL — ABNORMAL LOW (ref 4.0–10.5)
nRBC: 0 % (ref 0.0–0.2)

## 2020-10-08 LAB — BASIC METABOLIC PANEL
Anion gap: 10 (ref 5–15)
BUN: 26 mg/dL — ABNORMAL HIGH (ref 8–23)
CO2: 24 mmol/L (ref 22–32)
Calcium: 8.8 mg/dL — ABNORMAL LOW (ref 8.9–10.3)
Chloride: 104 mmol/L (ref 98–111)
Creatinine, Ser: 1.53 mg/dL — ABNORMAL HIGH (ref 0.61–1.24)
GFR, Estimated: 48 mL/min — ABNORMAL LOW (ref >60)
Glucose, Bld: 81 mg/dL (ref 70–99)
Potassium: 3.8 mmol/L (ref 3.5–5.1)
Sodium: 138 mmol/L (ref 135–145)

## 2020-10-08 LAB — GLUCOSE, CAPILLARY
Glucose-Capillary: 101 mg/dL — ABNORMAL HIGH (ref 70–99)
Glucose-Capillary: 218 mg/dL — ABNORMAL HIGH (ref 70–99)

## 2020-10-08 MED ORDER — ATORVASTATIN CALCIUM 40 MG PO TABS: 40.0000 mg | ORAL_TABLET | Freq: Every day | ORAL | 0 refills | Status: DC

## 2020-10-08 MED ORDER — LEVETIRACETAM 500 MG PO TABS: 500.0000 mg | ORAL_TABLET | Freq: Two times a day (BID) | ORAL | 3 refills | Status: DC

## 2020-10-08 NOTE — Discharge Planning (Deleted)
Discharge Summary  Gregory Nunez. BUL:[id number] DOB: [birth year]  PCP: [name], MD  Admit date: 10/05/2020 Discharge date: 10/08/2020  Time spent: 25 minutes  Recommendations for Outpatient Follow-up:  New medication: Keppra 500 mg p.o. twice daily Patient is advised not to drive for the next 6 months Patient will follow up with [facility] in the next 6 weeks Patient given follow-up information with [facility] behavioral health services for his alcohol addiction  Discharge Diagnoses:  Active Hospital Problems   Diagnosis Date Noted   GI bleed 10/05/2020   Angiodysplasia of intestine    Alcohol use 04/23/2020   History of CVA (cerebrovascular accident) 04/23/2020   Hyperlipidemia associated with type 2 diabetes mellitus ([place]) 04/23/2020   Tobacco use 04/23/2020   Type 2 diabetes mellitus without complication ([place]) 32/03/2481   Hypertension associated with diabetes ([place]) 11/08/2018   Syncopal seizure ([place]) 11/08/2018   Decreased hemoglobin 04/15/2018    Resolved Hospital Problems  No resolved problems to display.    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  In the emergency room, patient was noted to be anemic with a hemoglobin of 6.7 and admitted to hospitalist service.  Gastroenterology and neurology were consulted.  Hospital Course:  Principal Problem:   GI bleed with history of angiodysplasia of intestine: EGD unremarkable.   Patient was transfused 2 units packed red blood cells upon admission.  His syncopal event was not really felt to be secondary to blood loss.  He has had no further hypotensive or bleeding spells during hospitalization.  Unclear if this was more subacute and only incidental in finding when patient came in for syncopal event.  Counseled of course to quit drinking.  See below. Active Problems:   Decreased hemoglobin   Syncopal seizure [facility]): Neurology consulted.  Patient had TSH and cortisol and orthostatics checked which were found to be unremarkable.  Bradycardia initially reported, but no issues or events of bradycardia since that time.  No findings on telemetry.  Given EEG findings, patient started on IV Keppra.  Recommendation is as per neurology for continuation of p.o. Keppra, no driving x6 months and to follow-up with neurology as outpatient.    Hypertension associated with diabetes [facility]): Initially we held his blood pressure medications.  Blood pressures have since significantly increased during hospitalization and is to be resumed upon discharge.    Type 2 diabetes mellitus without complication ([place]): N0I at 6.5 noting good control.  Resume metformin and Amaryl.    History of CVA (cerebrovascular accident)   Hyperlipidemia associated with type 2 diabetes mellitus ([place]): Continue statin.    Alcohol use: Patient states last drink was day of admission.  He has a notable history of withdrawal and withdrawal seizures.  He was on a CIWA protocol during hospitalization although no acute events occurred.  He was supposed to be on thiamine, folate multivitamin but he has not been taking these.  These have been resumed upon discharge.  Suspect given information  for [facility] behavioral health services for alcohol addiction.    Tobacco use   Angiodysplasia of intestine   Procedures: Endoscopy done 6/22: Unremarkable, no acute findings. EEG done 6/22: Nonspecific cortical dysfunction in the left  temporal region.  No seizures or epileptiform discharges seen throughout recording.  Consultations: Neurology Gastroenterology  Discharge Exam: BP (!) 145/80 (BP Location: Right Arm)   Pulse 75   Temp 98.2 F (36.8 C) (Oral)   Resp 18   Ht 5\' 11"  (1.803 m)   Wt 77 kg   SpO2 97%   BMI 23.68 kg/m   General: Alert and oriented x3, no acute distress Cardiovascular: Regular rate and rhythm, S1-S2 Respiratory: Clear to auscultation bilaterally  Discharge Instructions You were cared for by a hospitalist during your hospital stay. If you have any questions about your discharge medications or the care you received while you were in the hospital after you are discharged, you can call the unit and asked to speak with the hospitalist on call if the hospitalist that took care of you is not available. Once you are discharged, your primary care physician will handle any further medical issues. Please note that NO REFILLS for any discharge medications will be authorized once you are discharged, as it is imperative that you return to your primary care physician (or establish a relationship with a primary care physician if you do not have one) for your aftercare needs so that they can reassess your need for medications and monitor your lab values.  Discharge Instructions     Diet - low sodium heart healthy   Complete by: As directed    Increase activity slowly   Complete by: As directed       Allergies as of 10/08/2020       Reactions   Penicillins Anxiety, Other (See Comments)   DID THE REACTION INVOLVE: Swelling of the face/tongue/throat, SOB, or low BP? No Sudden or severe rash/hives, skin peeling, or the inside of the mouth or nose? No Did it require medical treatment? No When did it last happen?      many years ago If all above answers are "NO", may proceed with cephalosporin use.        Medication List     STOP taking these medications    nicotine 21 mg/24hr patch Commonly  known as: NICODERM CQ - dosed in mg/24 hours   polyethylene glycol 17 g packet Commonly known as: MIRALAX / GLYCOLAX   senna-docusate 8.6-50 MG tablet Commonly known as: Senokot-S       TAKE these medications    albuterol 108 (90 Base) MCG/ACT inhaler Commonly known as: VENTOLIN HFA Inhale 2 puffs into the lungs every 6 (six) hours as needed for wheezing or shortness of breath.   amLODipine 10 MG tablet Commonly known as: NORVASC Take 10 mg by mouth daily.   atorvastatin 40 MG tablet Commonly known as: LIPITOR Take 1 tablet (40 mg total) by mouth daily.   carvedilol 25 MG tablet Commonly known as: COREG Take 1 tablet (25 mg total) by mouth 2 (two) times daily with a meal.   Ensure Plus Liqd Take 237 mLs by mouth 2 (two) times daily between meals.   folic acid 1 MG tablet Commonly known as: FOLVITE Take 1 tablet (1 mg total) by mouth daily.   glimepiride 4 MG tablet Commonly known as: AMARYL Take 4 mg by mouth daily with breakfast.   iron polysaccharides 150 MG capsule Commonly known as: Nu-Iron Take 1  capsule (150 mg total) by mouth daily.   levETIRAcetam 500 MG tablet Commonly known as: Keppra Take 1 tablet (500 mg total) by mouth 2 (two) times daily.   losartan 100 MG tablet Commonly known as: COZAAR Take 100 mg by mouth daily.   metFORMIN 500 MG tablet Commonly known as: GLUCOPHAGE Take 500 mg by mouth 2 (two) times daily.   multivitamin with minerals Tabs tablet Take 1 tablet by mouth daily.   pantoprazole 40 MG tablet Commonly known as: PROTONIX Take 40 mg by mouth daily.   thiamine 100 MG tablet Take 1 tablet (100 mg total) by mouth daily.       Allergies  Allergen Reactions   Penicillins Anxiety and Other (See Comments)    DID THE REACTION INVOLVE: Swelling of the face/tongue/throat, SOB, or low BP? No Sudden or severe rash/hives, skin peeling, or the inside of the mouth or nose? No Did it require medical treatment? No When did it last  happen?      many years ago If all above answers are "NO", may proceed with cephalosporin use.       Follow-up Information     [facility] Follow up.   Contact information: [place] [phone]        [facility] Follow up.   Why: [phone]  You may to make an appointment or go to clinic in person for open access clinic Contact information: [place]  [place] [address] [phone]                  The results of significant diagnostics from this hospitalization (including imaging, microbiology, ancillary and laboratory) are listed below for reference.    Significant Diagnostic Studies: DG Chest 2 View  Result Date: 10/05/2020 CLINICAL DATA:  Syncope EXAM: CHEST - 2 VIEW COMPARISON:  11/07/2018 FINDINGS: The heart size and mediastinal contours are within normal limits. Both lungs are clear. The visualized skeletal structures are unremarkable. IMPRESSION: No active cardiopulmonary disease. Electronically Signed   By: Franchot Gallo M.D.   On: 10/05/2020 19:24   CT HEAD WO CONTRAST  Result Date: 10/05/2020 CLINICAL DATA:  Syncope EXAM: CT HEAD WITHOUT CONTRAST TECHNIQUE: Contiguous axial images were obtained from the base of the skull through the vertex without intravenous contrast. COMPARISON:  CT head 09/13/2020 FINDINGS: Brain: Generalized atrophy. Chronic microvascular ischemic change in the white matter. Chronic infarct in the deep white matter on the left unchanged. Chronic infarct in the left pons unchanged. Negative for acute infarct, hemorrhage, mass. Vascular: Negative for hyperdense vessel Skull: Negative Sinuses/Orbits: Paranasal sinuses clear. Bilateral cataract extraction Other: None IMPRESSION: Atrophy and chronic ischemic change. No acute abnormality no change from the prior study. Electronically Signed   By: Franchot Gallo M.D.   On: 10/05/2020 18:32   CT HEAD WO CONTRAST  Result  Date: 09/13/2020 CLINICAL DATA:  Neuro deficit EXAM: CT HEAD WITHOUT CONTRAST TECHNIQUE: Contiguous axial images were obtained from the base of the skull through the vertex without intravenous contrast. COMPARISON:  04/24/2020 FINDINGS: Brain: No findings to suggest acute hemorrhage, acute infarction or space-occupying mass lesion are noted. Areas of prior ischemia are noted in the deep white matter and extreme capsule on the left as well as in the midportion of the pons. No other focal abnormality is noted. Vascular: No hyperdense vessel or unexpected calcification. Skull: Normal. Negative for fracture or focal lesion. Sinuses/Orbits: No acute finding. Other: None. IMPRESSION: Chronic ischemic changes  without acute abnormality. Electronically Signed   By: Inez Catalina M.D.   On: 09/13/2020 20:03   MR BRAIN WO CONTRAST  Result Date: 10/07/2020 CLINICAL DATA:  Seizure with abnormal neuro exam EXAM: MRI HEAD WITHOUT CONTRAST TECHNIQUE: Multiplanar, multiecho pulse sequences of the brain and surrounding structures were obtained without intravenous contrast. COMPARISON:  04/23/2020 FINDINGS: Brain: No acute infarction, hemorrhage, hydrocephalus, extra-axial collection or mass lesion. Chronic small vessel ischemia with confluent gliosis and chronic small vessel infarcts. There have been lacunes in the bilateral pons, bilateral deep white matter, and a remote perforator infarct at the left corona radiata. Generalized cerebral volume loss. Chronic microhemorrhages in the left cerebral white matter, likely post ischemic/hypertensive even though more peripheral in location. Vascular: Normal flow voids Skull and upper cervical spine: Normal marrow signal Sinuses/Orbits: Bilateral cataract resection IMPRESSION: 1. No acute or reversible finding. 2. Advanced chronic small vessel disease. Electronically Signed   By: Monte Fantasia M.D.   On: 10/07/2020 10:16   EEG adult  Result Date: 10/07/2020 Lora Havens, MD      10/07/2020  3:50 PM Patient Name: Gregory Nunez. MRN: [id number] Epilepsy Attending: Lora Havens Referring Physician/Provider: Dr. Kathrynn Speed Date: 10/07/2020 Duration: 31.48 minutes Patient history: 75 year old male with recurrent episodes of right hemispheric visual scintillations followed by loss of consciousness.  EEG to evaluate for seizures. Level of alertness: Awake AEDs during EEG study: Keppra Technical aspects: This EEG study was done with scalp electrodes positioned according to the 10-20 International system of electrode placement. Electrical activity was acquired at a sampling rate of 500Hz  and reviewed with a high frequency filter of 70Hz  and a low frequency filter of 1Hz . EEG data were recorded continuously and digitally stored. Description: The posterior dominant rhythm consists of 9-10 Hz activity of moderate voltage (25-35 uV) seen predominantly in posterior head regions, symmetric and reactive to eye opening and eye closing. EEG showed intermittent 3 to 6 Hz theta-delta slowing in left temporal region.  Hyperventilation and photic stimulation were not performed.   ABNORMALITY - Intermittent slow, left temporal region IMPRESSION: This study is suggestive of nonspecific cortical dysfunction in left temporal region.  No seizures or epileptiform discharges were seen throughout the recording. Lora Havens    Microbiology: Recent Results (from the past 240 hour(s))  Resp Panel by RT-PCR (Flu A&B, Covid) Nasopharyngeal Swab     Status: None   Collection Time: 10/05/20  8:04 PM   Specimen: Nasopharyngeal Swab; Nasopharyngeal(NP) swabs in vial transport medium  Result Value Ref Range Status   SARS Coronavirus 2 by RT PCR NEGATIVE NEGATIVE Final    Comment: (NOTE) SARS-CoV-2 target nucleic acids are NOT DETECTED.  The SARS-CoV-2 RNA is generally detectable in upper respiratory specimens during the acute phase of infection. The lowest concentration of SARS-CoV-2 viral  copies this assay can detect is 138 copies/mL. A negative result does not preclude SARS-Cov-2 infection and should not be used as the sole basis for treatment or other patient management decisions. A negative result may occur with  improper specimen collection/handling, submission of specimen other than nasopharyngeal swab, presence of viral mutation(s) within the areas targeted by this assay, and inadequate number of viral copies(<138 copies/mL). A negative result must be combined with clinical observations, patient history, and epidemiological information. The expected result is Negative.  Fact Sheet for Patients:  EntrepreneurPulse.com.au  Fact Sheet for Healthcare Providers:  IncredibleEmployment.be  This test is no t yet approved or cleared by the [place]  FDA and  has been authorized for detection and/or diagnosis of SARS-CoV-2 by FDA under an Emergency Use Authorization (EUA). This EUA will remain  in effect (meaning this test can be used) for the duration of the COVID-19 declaration under Section 564(b)(1) of the Act, 21 U.S.C.section 360bbb-3(b)(1), unless the authorization is terminated  or revoked sooner.       Influenza A by PCR NEGATIVE NEGATIVE Final   Influenza B by PCR NEGATIVE NEGATIVE Final    Comment: (NOTE) The Xpert Xpress SARS-CoV-2/FLU/RSV plus assay is intended as an aid in the diagnosis of influenza from Nasopharyngeal swab specimens and should not be used as a sole basis for treatment. Nasal washings and aspirates are unacceptable for Xpert Xpress SARS-CoV-2/FLU/RSV testing.  Fact Sheet for Patients: EntrepreneurPulse.com.au  Fact Sheet for Healthcare Providers: IncredibleEmployment.be  This test is not yet approved or cleared by the [place] FDA and has been authorized for detection and/or diagnosis of SARS-CoV-2 by FDA under an Emergency Use Authorization (EUA). This  EUA will remain in effect (meaning this test can be used) for the duration of the COVID-19 declaration under Section 564(b)(1) of the Act, 21 U.S.C. section 360bbb-3(b)(1), unless the authorization is terminated or revoked.  Performed at [facility], [place] [address], [place], [place] [address]      Labs: Basic Metabolic Panel: Recent Labs  Lab 10/05/20 1738 10/06/20 0500 10/07/20 0345 10/08/20 0432  NA 136 139 137 138  K 3.4* 3.6 3.6 3.8  CL 113* 112* 106 104  CO2 16* 21* 23 24  GLUCOSE 117* 84 91 81  BUN 21 22 25* 26*  CREATININE 1.51* 1.51* 1.73* 1.53*  CALCIUM 7.4* 8.6* 8.9 8.8*  MG 1.7  --   --   --    Liver Function Tests: Recent Labs  Lab 10/05/20 1738 10/06/20 0500  AST 35 39  ALT 29 33  ALKPHOS 44 54  BILITOT 0.5 0.7  PROT 6.2* 7.2  ALBUMIN 2.3* 2.6*   No results for input(s): LIPASE, AMYLASE in the last 168 hours. No results for input(s): AMMONIA in the last 168 hours. CBC: Recent Labs  Lab 10/05/20 1738 10/06/20 0500 10/07/20 0345 10/08/20 0432  WBC 3.3* 4.9 4.1 3.6*  HGB 6.7* 10.4* 10.5* 11.0*  HCT 21.6* 31.7* 31.1* 32.6*  MCV 92.3 89.5 86.4 86.9  PLT 183 164 167 158   Cardiac Enzymes: No results for input(s): CKTOTAL, CKMB, CKMBINDEX, TROPONINI in the last 168 hours. BNP: BNP (last 3 results) No results for input(s): BNP in the last 8760 hours.  ProBNP (last 3 results) No results for input(s): PROBNP in the last 8760 hours.  CBG: Recent Labs  Lab 10/07/20 1212 10/07/20 1649 10/07/20 2038 10/08/20 0637 10/08/20 1152  GLUCAP 77 138* 198* 101* 218*       Signed:  Annita Brod, MD [facility] 10/08/2020, 1:57 PM

## 2020-10-08 NOTE — Evaluation (Signed)
Occupational Therapy Evaluation Patient Details Name: Gregory Nunez. 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  Especially when considered in the setting of at least one previously witnessed seizure.     MRI Brain with no acute abnormalities. rEEG with no epileptiform discharges. In the ED patient was found to be anemic to 6.7 and received 2 units PRBCs.   Clinical Impression   Patient is currently requiring assistance with ADLs including supervision assist with toileting, min guard assist with LE dressing, and with bathing, and setup assist with seated UE dressing and grooming, all of which is below patient's typical baseline of being Independent.  During this evaluation, patient was limited by mild unsteadiness with initial stand from EOB and decreased activity tolerance as well as expressive speech difficulties and delayed cognitive processing, which has the potential to impact patient's safety and independence during functional mobility, as well as performance for ADLs. [place] "6-clicks" Daily Activity Inpatient Short Form score of 19/24 this session. Patient curently lives with his sister in her 1 story home, who is able to provide 24/7 supervision and assistance.  Patient demonstrates good rehab potential, and should benefit from continued skilled occupational therapy services while in acute care to maximize safety, independence and quality of life at home.  Continued occupational therapy services in the home is recommended.  ?    Follow Up Recommendations  Home health OT    Equipment Recommendations       Recommendations for Other Services       Precautions / Restrictions Precautions Precautions: Fall Restrictions Weight Bearing Restrictions: No      Mobility Bed Mobility Overal bed mobility:  Modified Independent                  Transfers Overall transfer level: Needs assistance   Transfers: Sit to/from Stand;Stand Pivot Transfers Sit to Stand: Min guard (initially unsteady when standing from EOB-possibly sleepy. Min guard for intiial safety then supervision.) Stand pivot transfers: Supervision            Balance Overall balance assessment: Mild deficits observed, not formally tested                                         ADL either performed or assessed with clinical judgement   ADL Overall ADL's : Needs assistance/impaired Eating/Feeding: Independent   Grooming: Sitting Grooming Details (indicate cue type and reason): Pt sat EOB and applied lotion to UEs. Pt declined standing at sink. Wears dentures and declined oral care. Upper Body Bathing: Set up;Sitting   Lower Body Bathing: Min guard;Sit to/from stand   Upper Body Dressing : Set up;Sitting   Lower Body Dressing: Set up;Min guard;Sitting/lateral leans Lower Body Dressing Details (indicate cue type and reason): Pt donned sock with setup at EOB. Pt Min guard for standing LE dressing. Toilet Transfer: Sales executive;Ambulation Toilet Transfer Details (indicate cue type and reason): Pt ambualted ~10' to standard toilet and perform toilet ranser with supervision/increased effort and no need of wall mounted grab bar. Toileting- Clothing Manipulation and Hygiene: Supervision/safety;Sitting/lateral Chartered certified accountant Details (indicate cue type and reason): Not yet tested Functional mobility during ADLs: Supervision/safety;Min guard General ADL Comments: Pt initially a little unsteady when standing  and erquired Min guard. Pt progressed to supervision with in-room ambulation and transfers.     Vision   Additional Comments: Pt denied vision changes.     Perception     Praxis      Pertinent Vitals/Pain Pain Assessment: No/denies pain     Hand  Dominance Right   Extremity/Trunk Assessment Upper Extremity Assessment Upper Extremity Assessment: Overall WFL for tasks assessed   Lower Extremity Assessment Lower Extremity Assessment: Overall WFL for tasks assessed   Cervical / Trunk Assessment Cervical / Trunk Assessment: Normal   Communication Communication Communication: Expressive difficulties   Cognition Arousal/Alertness: Awake/alert Behavior During Therapy: WFL for tasks assessed/performed Overall Cognitive Status: No family/caregiver present to determine baseline cognitive functioning                                 General Comments: Person, place, year (not month), and to situation. Pt with some delayed processing and needs increased time to verbally respond to questions.   General Comments       Exercises     Shoulder Instructions      Home Living Family/patient expects to be discharged to:: Private residence Living Arrangements:  (Pt has been living with his retired sister since coming home from SNF) Available Help at Discharge: Family;Available 24 hours/day Type of Home: House Home Access: Level entry     Home Layout: One level     Bathroom Shower/Tub: Teacher, early years/pre: Standard     Home Equipment: None   Additional Comments: Pt reports no DME at sister's home.   Pt's residence remains his 8th floor highrise apartment with elevator access.      Prior Functioning/Environment Level of Independence: Needs assistance  Gait / Transfers Assistance Needed: Pt reports that he ambulates without an AD, unlimited distances ADL's / Homemaking Assistance Needed: Pt reports independence with BADLs and sister performs all IADLs.            OT Problem List: Impaired balance (sitting and/or standing);Decreased knowledge of precautions;Decreased safety awareness;Decreased activity tolerance;Decreased cognition      OT Treatment/Interventions: Self-care/ADL training;Therapeutic  activities;Energy conservation;DME and/or AE instruction;Patient/family education;Balance training;Cognitive remediation/compensation    OT Goals(Current goals can be found in the care plan section) Acute Rehab OT Goals Patient Stated Goal: To return to living indepednently in his high rise apartment. OT Goal Formulation: With patient Time For Goal Achievement: 10/22/20 Potential to Achieve Goals: Good ADL Goals Pt Will Perform Lower Body Dressing: with modified independence Pt Will Transfer to Toilet: with modified independence;regular height toilet Pt Will Perform Tub/Shower Transfer: with modified independence;ambulating;Tub transfer Additional ADL Goal #1: Pt will engage in 10 min standing functional activities without loss of balance, in order to demonstrate improved activity tolerance and balance needed to perform ADLs safely at home. Additional ADL Goal #2: Patient will identify at least 3 fall prevention strategies to employ at home in order to maximize function and quality of life and decrease caregiver burden while preventing injury  and rehospitalization.  OT Frequency: Min 2X/week   Barriers to D/C:            Co-evaluation              AM-PAC OT "6 Clicks" Daily Activity     Outcome Measure Help from another person eating meals?: None Help from another person taking care of personal grooming?: A Little Help from another person toileting, which includes  using toliet, bedpan, or urinal?: A Little Help from another person bathing (including washing, rinsing, drying)?: A Little Help from another person to put on and taking off regular upper body clothing?: A Little Help from another person to put on and taking off regular lower body clothing?: A Little 6 Click Score: 19   End of Session    Activity Tolerance: Patient limited by fatigue Patient left: in bed;with call bell/phone within reach;with bed alarm set  OT Visit Diagnosis: Unsteadiness on feet  (R26.81);Cognitive communication deficit (R41.841)                Time: 3646-8032 OT Time Calculation (min): 16 min Charges:  OT General Charges $OT Visit: 1 Visit OT Evaluation $OT Eval Low Complexity: [place], OT Acute Rehab Services Office: [phone] 10/08/2020 Julien Girt 10/08/2020, 10:07 AM

## 2020-10-08 NOTE — Progress Notes (Signed)
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  Especially when considered in the setting of at least one previously witnessed seizure.  MRI Brain with no acute abnormalities. rEEG with no epileptiform discharges.  Recs: - Continue Keppra 500mg  BID IV. Can transition to Keppra 500mg  BID PO when able - Follow up with neurology outpatient. - No driving for 6 months. - Full seizure precautions listed below. - Neurology inpatient team will signoff. Please feel free to contact us with any questions or concerns.   Discussed with Dr. [place] Pink over secure chat.  Seizure precautions: Per [facility] statutes, patients with seizures are not allowed to drive until they have been seizure-free for six months and cleared by a physician    Use caution when using heavy equipment or power tools. Avoid working on ladders or at heights. Take showers instead of baths. Ensure the water temperature is not too high on the home water heater. Do not go swimming alone. Do not lock yourself in a room alone (i.e. bathroom). When caring for infants or small children, sit down when holding, feeding, or changing them to minimize risk of injury to the child in the event you have a seizure. Maintain good sleep hygiene. Avoid alcohol.    If patient has another seizure, call 911 and bring them back to the ED if: A.  The seizure lasts longer than 5 minutes.      B.  The patient doesn't wake shortly after the seizure or has new problems such as difficulty seeing, speaking or moving following the seizure C.  The patient was injured during the seizure D.  The patient has a temperature over 102 F (39C) E.  The patient vomited during the seizure and now is having trouble breathing    During the Seizure   - First, ensure adequate ventilation and place patients on the floor on their left side  Loosen clothing around the  neck and ensure the airway is patent. If the patient is clenching the teeth, do not force the mouth open with any object as this can cause severe damage - Remove all items from the surrounding that can be hazardous. The patient may be oblivious to what's happening and may not even know what he or she is doing. If the patient is confused and wandering, either gently guide him/her away and block access to outside areas - Reassure the individual and be comforting - Call 911. In most cases, the seizure ends before EMS arrives. However, there are cases when seizures may last over 3 to 5 minutes. Or the individual may have developed breathing difficulties or severe injuries. If a pregnant patient or a person with diabetes develops a seizure, it is prudent to call an ambulance. - Finally, if the patient does not regain full consciousness, then call EMS. Most patients will remain confused for about 45 to 90 minutes after a seizure, so you must use judgment in calling for help. - Avoid restraints but make sure the patient is in a bed with padded side rails - Place the individual in a lateral position with the neck slightly flexed; this will help the saliva drain from the mouth and prevent the tongue from falling backward - Remove all nearby furniture and other hazards from the area - Provide verbal assurance as the individual is regaining consciousness - Provide the patient with privacy if possible - Call for help  and start treatment as ordered by the caregiver    After the Seizure (Postictal Stage)   After a seizure, most patients experience confusion, fatigue, muscle pain and/or a headache. Thus, one should permit the individual to sleep. For the next few days, reassurance is essential. Being calm and helping reorient the person is also of importance.   Most seizures are painless and end spontaneously. Seizures are not harmful to others but can lead to complications such as stress on the lungs, brain and  the heart. Individuals with prior lung problems may develop labored breathing and respiratory distress.      [place] Pager Number [phone]

## 2020-10-08 NOTE — Evaluation (Signed)
Physical Therapy Evaluation Patient Details Name: Gregory Nunez. MRN: [id number] DOB: 1946-02-01 Today's Date: 10/08/2020   History of Present Illness  75 y.o. male who presents to ED via EMS 10/05/20 after being found down by family after possible syncopal episode. Bradycardic in field to 49bpm. In ED Hgb 6.7 FPBT + for HGB. 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 Pt reports ambulation without AD, and independence with bADLs. Pt's sister provides for iADLs. Pt is currently limited by decreased awareness of his deficits, decreased coordination and balance especially with gait. Pt with limited response when asked about coordination and vision so difficult to pinpoint cause of balance deficits. Pt is currently mod I for bed mobility, min guard for transfers and min A for ambulation with RW. PT recommending HHPT to work on balance and DME training to safely navigate in his home environment. PT will continue to follow acutely.      Follow Up Recommendations Home health PT;Supervision for mobility/OOB    Equipment Recommendations  Rolling walker with 5" wheels    Recommendations for Other Services       Precautions / Restrictions Precautions Precautions: Fall Restrictions Weight Bearing Restrictions: No      Mobility  Bed Mobility Overal bed mobility: Modified Independent                  Transfers Overall transfer level: Needs assistance   Transfers: Sit to/from Stand Sit to Stand: Min guard Stand pivot transfers: Supervision       General transfer comment: min guard for safety, increased self steadying using bed on posterior LE  Ambulation/Gait Ambulation/Gait assistance: Min assist Gait Distance (Feet):  80 Feet Assistive device: None;Rolling walker (2 wheeled) Gait Pattern/deviations: Step-through pattern;Decreased step length - right;Decreased step length - left;Scissoring;Trunk flexed;Drifts right/left Gait velocity: slowed Gait velocity interpretation: <1.8 ft/sec, indicate of risk for recurrent falls General Gait Details: min A for steadying with LoB due to scissoring in ambulation without AD, improved steadiness with use of RW, need constant cuing for looking up and out to maintain gait stability        Balance Overall balance assessment: Needs assistance Sitting-balance support: Feet supported;No upper extremity supported Sitting balance-Leahy Scale: Good     Standing balance support: No upper extremity supported;During functional activity Standing balance-Leahy Scale: Fair                               Pertinent Vitals/Pain Pain Assessment: No/denies pain    Home Living Family/patient expects to be discharged to:: Private residence Living Arrangements: Other relatives Available Help at Discharge: Family;Available 24 hours/day Type of Home: House Home Access: Level entry     Home Layout: One level Home Equipment: None Additional Comments: Pt reports no DME at sister's home.   Pt's residence remains his 8th floor highrise apartment with elevator access.    Prior Function Level of Independence: Needs assistance   Gait / Transfers Assistance Needed: Pt reports that he ambulates without an AD, unlimited distances  ADL's / Homemaking Assistance Needed: Pt reports independence with BADLs and sister performs all IADLs.        Hand Dominance   Dominant Hand: Right    Extremity/Trunk Assessment  Upper Extremity Assessment Upper Extremity Assessment: RUE deficits/detail RUE Coordination: decreased fine motor    Lower Extremity Assessment Lower Extremity Assessment: RLE deficits/detail;Difficult to assess due to impaired cognition RLE Deficits /  Details: generalized weakness RLE Coordination: decreased fine motor    Cervical / Trunk Assessment Cervical / Trunk Assessment: Normal  Communication   Communication: Expressive difficulties (slowed speech)  Cognition Arousal/Alertness: Awake/alert Behavior During Therapy: WFL for tasks assessed/performed Overall Cognitive Status: No family/caregiver present to determine baseline cognitive functioning                                 General Comments: unable to remember month, oriented to person, place and situation      General Comments General comments (skin integrity, edema, etc.): VSS on RA        Assessment/Plan    PT Assessment Patient needs continued PT services  PT Problem List Decreased balance;Decreased mobility;Decreased cognition;Decreased safety awareness       PT Treatment Interventions DME instruction;Gait training;Functional mobility training;Therapeutic activities;Therapeutic exercise;Balance training;Cognitive remediation;Neuromuscular re-education;Patient/family education    PT Goals (Current goals can be found in the Care Plan section)  Acute Rehab PT Goals Patient Stated Goal: To return to living indepednently in his high rise apartment. PT Goal Formulation: With patient Time For Goal Achievement: 10/22/20 Potential to Achieve Goals: Fair    Frequency Min 3X/week    AM-PAC PT "6 Clicks" Mobility  Outcome Measure Help needed turning from your back to your side while in a flat bed without using bedrails?: None Help needed moving from lying on your back to sitting on the side of a flat bed without using bedrails?: None Help needed moving to and from a bed to a chair (including a wheelchair)?: A Little Help needed standing up from a chair using your arms (e.g., wheelchair or bedside chair)?: A Little Help needed to walk in hospital room?: A Little Help needed climbing 3-5 steps with a railing? : A Lot 6 Click Score: 19    End of  Session Equipment Utilized During Treatment: Gait belt Activity Tolerance: Patient tolerated treatment well Patient left: in chair;with call bell/phone within reach;with chair alarm set Nurse Communication: Mobility status PT Visit Diagnosis: Unsteadiness on feet (R26.81);Other abnormalities of gait and mobility (R26.89);Repeated falls (R29.6);Muscle weakness (generalized) (M62.81);History of falling (Z91.81);Difficulty in walking, not elsewhere classified (R26.2);Other symptoms and signs involving the nervous system (X52.841)    Time: 3244-0102 PT Time Calculation (min) (ACUTE ONLY): 19 min   Charges:   PT Evaluation $PT Eval Moderate Complexity: 1 Mod          Koben Daman B. Migdalia Dk PT, DPT Acute Rehabilitation Services Pager [phone] Office [phone]   [place] 10/08/2020, 11:09 AM

## 2020-10-08 NOTE — TOC CAGE-AID Note (Signed)
Transition of Care [facility]) - CAGE-AID Screening   Patient Details  Name: Gregory Nunez. MRN: [id number] Date of Birth: 1945-12-27  Transition of Care [facility]) CM/SW Contact:    Carles Collet, RN Phone Number: 10/08/2020, 11:23 AM   Clinical Narrative:  [name] w patient at bedside. He states that he is wanting to decrease or stop his drinking. Provided with resources. Discussed goals. He mentioned he would like to follow up at [facility] for mental health support. Will include on AVS. Confirmed he lives with his mother, has Museum/gallery exhibitions officer. He states he was been working hard to get his life together. He states that as you get older you start to think about how you spend your money and time.  He shared a picture of his grandson. He recognizes that he needs to address his habits and the people he hangs out with. We talked about him wanting to start volunteering with kids in order to redirect his time.   PCP Audrea Muscat Placey at [place]:    Have You Ever Felt You Ought to Cut Down on Your Drinking or Drug Use?: Yes Have People Annoyed You By Critizing Your Drinking Or Drug Use?: Yes Have You Felt Bad Or Guilty About Your Drinking Or Drug Use?: No Have You Ever Had a Drink or Used Drugs First Thing In The Morning to Steady Your Nerves or to Get Rid of a Hangover?: No CAGE-AID Score: 2  Substance Abuse Education Offered: Yes  Substance abuse interventions: Scientist, clinical (histocompatibility and immunogenetics)

## 2020-10-08 NOTE — Discharge Summary (Signed)
Discharge Summary   Gregory Nunez. TGG:[id number] DOB: [birth year]   PCP: [name], MD   Admit date: 10/05/2020 Discharge date: 10/08/2020   Time spent: 25 minutes   Recommendations for Outpatient Follow-up:  New medication: Keppra 500 mg p.o. twice daily Patient is advised not to drive for the next 6 months Patient will follow up with [facility] in the next 6 weeks Patient given follow-up information with [facility] behavioral health services for his alcohol addiction   Discharge Diagnoses:      Active Hospital Problems    Diagnosis Date Noted   GI bleed 10/05/2020   Angiodysplasia of intestine     Alcohol use 04/23/2020   History of CVA (cerebrovascular accident) 04/23/2020   Hyperlipidemia associated with type 2 diabetes mellitus ([place]) 04/23/2020   Tobacco use 04/23/2020   Type 2 diabetes mellitus without complication ([place]) 70/35/0093   Hypertension associated with diabetes ([place]) 11/08/2018   Syncopal seizure ([place]) 11/08/2018   Decreased hemoglobin 04/15/2018     Resolved Hospital Problems  No resolved problems to display.      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  In the emergency room, patient was noted to be anemic with a hemoglobin of 6.7 and admitted to hospitalist service.  Gastroenterology and neurology were consulted.   Hospital Course:  Principal Problem:   GI bleed with history of angiodysplasia of  intestine: EGD unremarkable.  Patient was transfused 2 units packed red blood cells upon admission.  His syncopal event was not really felt to be secondary to blood loss.  He has had no further hypotensive or bleeding spells during hospitalization.  Unclear if this was more subacute and only incidental in finding when patient came in for syncopal event.  Counseled of course to quit drinking.  See below. Active Problems:   Decreased hemoglobin   Syncopal seizure [facility]): Neurology consulted.  Patient had TSH and cortisol and orthostatics checked which were found to be unremarkable.  Bradycardia initially reported, but no issues or events of bradycardia since that time.  No findings on telemetry.  Given EEG findings, patient started on IV Keppra.  Recommendation is as per neurology for continuation of p.o. Keppra, no driving x6 months and to follow-up with neurology as outpatient.     Hypertension associated with diabetes [facility]): Initially we held his blood pressure medications.  Blood pressures have since significantly increased during hospitalization and is to be resumed upon discharge.     Type 2 diabetes mellitus without complication ([place]): G1W at 6.5 noting good control.  Resume metformin and Amaryl.     History of CVA (cerebrovascular accident)   Hyperlipidemia associated with type 2 diabetes mellitus ([place]): Continue statin.     Alcohol use: Patient states last drink was day of admission.  He has a notable history of withdrawal and withdrawal seizures.  He was on a CIWA protocol during hospitalization although no acute events  occurred.  He was supposed to be on thiamine, folate multivitamin but he has not been taking these.  These have been resumed upon discharge.  Suspect given information for [facility] behavioral health services for alcohol addiction.     Tobacco use   Angiodysplasia of intestine     Procedures: Endoscopy done 6/22: Unremarkable, no acute findings. EEG done 6/22: Nonspecific  cortical dysfunction in the left temporal region.  No seizures or epileptiform discharges seen throughout recording.   Consultations: Neurology Gastroenterology   Discharge Exam: BP (!) 145/80 (BP Location: Right Arm)   Pulse 75   Temp 98.2 F (36.8 C) (Oral)   Resp 18   Ht 5\' 11"  (1.803 m)   Wt 77 kg   SpO2 97%   BMI 23.68 kg/m    General: Alert and oriented x3, no acute distress Cardiovascular: Regular rate and rhythm, S1-S2 Respiratory: Clear to auscultation bilaterally   Discharge Instructions You were cared for by a hospitalist during your hospital stay. If you have any questions about your discharge medications or the care you received while you were in the hospital after you are discharged, you can call the unit and asked to speak with the hospitalist on call if the hospitalist that took care of you is not available. Once you are discharged, your primary care physician will handle any further medical issues. Please note that NO REFILLS for any discharge medications will be authorized once you are discharged, as it is imperative that you return to your primary care physician (or establish a relationship with a primary care physician if you do not have one) for your aftercare needs so that they can reassess your need for medications and monitor your lab values.   Discharge Instructions       Diet - low sodium heart healthy   Complete by: As directed      Increase activity slowly   Complete by: As directed           Allergies as of 10/08/2020         Reactions    Penicillins Anxiety, Other (See Comments)    DID THE REACTION INVOLVE: Swelling of the face/tongue/throat, SOB, or low BP? No Sudden or severe rash/hives, skin peeling, or the inside of the mouth or nose? No Did it require medical treatment? No When did it last happen?      many years ago If all above answers are "NO", may proceed with cephalosporin use.            Medication List       STOP taking these  medications     nicotine 21 mg/24hr patch Commonly known as: NICODERM CQ - dosed in mg/24 hours    polyethylene glycol 17 g packet Commonly known as: MIRALAX / GLYCOLAX    senna-docusate 8.6-50 MG tablet Commonly known as: Senokot-S           TAKE these medications     albuterol 108 (90 Base) MCG/ACT inhaler Commonly known as: VENTOLIN HFA Inhale 2 puffs into the lungs every 6 (six) hours as needed for wheezing or shortness of breath.    amLODipine 10 MG tablet Commonly known as: NORVASC Take 10 mg by mouth daily.    atorvastatin 40 MG tablet Commonly known as: LIPITOR Take 1 tablet (40 mg total) by mouth daily.    carvedilol 25 MG tablet Commonly known as: COREG Take 1 tablet (25 mg total) by mouth 2 (two) times daily with a meal.  Ensure Plus Liqd Take 237 mLs by mouth 2 (two) times daily between meals.    folic acid 1 MG tablet Commonly known as: FOLVITE Take 1 tablet (1 mg total) by mouth daily.    glimepiride 4 MG tablet Commonly known as: AMARYL Take 4 mg by mouth daily with breakfast.    iron polysaccharides 150 MG capsule Commonly known as: Nu-Iron Take 1 capsule (150 mg total) by mouth daily.    levETIRAcetam 500 MG tablet Commonly known as: Keppra Take 1 tablet (500 mg total) by mouth 2 (two) times daily.    losartan 100 MG tablet Commonly known as: COZAAR Take 100 mg by mouth daily.    metFORMIN 500 MG tablet Commonly known as: GLUCOPHAGE Take 500 mg by mouth 2 (two) times daily.    multivitamin with minerals Tabs tablet Take 1 tablet by mouth daily.    pantoprazole 40 MG tablet Commonly known as: PROTONIX Take 40 mg by mouth daily.    thiamine 100 MG tablet Take 1 tablet (100 mg total) by mouth daily.                Allergies  Allergen Reactions   Penicillins Anxiety and Other (See Comments)      DID THE REACTION INVOLVE: Swelling of the face/tongue/throat, SOB, or low BP? No Sudden or severe rash/hives, skin peeling, or the  inside of the mouth or nose? No Did it require medical treatment? No When did it last happen?      many years ago If all above answers are "NO", may proceed with cephalosporin use.            Follow-up Information       [facility] Follow up.   Contact information: [place] [phone]            [facility] Follow up.   Why: [phone]   You may to make an appointment or go to clinic in person for open access clinic Contact information: [place] [address] [phone]                            The results of significant diagnostics from this hospitalization (including imaging, microbiology, ancillary and laboratory) are listed below for reference.    Significant Diagnostic Studies:  Imaging Results  DG Chest 2 View   Result Date: 10/05/2020 CLINICAL DATA:  Syncope EXAM: CHEST - 2 VIEW COMPARISON:  11/07/2018 FINDINGS: The heart size and mediastinal contours are within normal limits. Both lungs are clear. The visualized skeletal structures are unremarkable. IMPRESSION: No active cardiopulmonary disease. Electronically Signed   By: Franchot Gallo M.D.   On: 10/05/2020 19:24   CT HEAD WO CONTRAST   Result Date: 10/05/2020 CLINICAL DATA:  Syncope EXAM: CT HEAD WITHOUT CONTRAST TECHNIQUE: Contiguous axial images were obtained from the base of the skull through the vertex without intravenous contrast. COMPARISON:  CT head 09/13/2020 FINDINGS: Brain: Generalized atrophy. Chronic microvascular ischemic change in the white matter. Chronic infarct in the deep white matter on the left unchanged. Chronic infarct in the left pons unchanged. Negative for acute infarct, hemorrhage, mass. Vascular: Negative for hyperdense vessel Skull: Negative Sinuses/Orbits: Paranasal sinuses clear. Bilateral cataract extraction Other: None IMPRESSION: Atrophy and chronic ischemic change. No acute abnormality no change  from the prior study. Electronically Signed   By: Franchot Gallo M.D.   On: 10/05/2020 18:32  CT HEAD WO CONTRAST   Result Date: 09/13/2020 CLINICAL DATA:  Neuro deficit EXAM: CT HEAD WITHOUT CONTRAST TECHNIQUE: Contiguous axial images were obtained from the base of the skull through the vertex without intravenous contrast. COMPARISON:  04/24/2020 FINDINGS: Brain: No findings to suggest acute hemorrhage, acute infarction or space-occupying mass lesion are noted. Areas of prior ischemia are noted in the deep white matter and extreme capsule on the left as well as in the midportion of the pons. No other focal abnormality is noted. Vascular: No hyperdense vessel or unexpected calcification. Skull: Normal. Negative for fracture or focal lesion. Sinuses/Orbits: No acute finding. Other: None. IMPRESSION: Chronic ischemic changes without acute abnormality. Electronically Signed   By: Inez Catalina M.D.   On: 09/13/2020 20:03   MR BRAIN WO CONTRAST   Result Date: 10/07/2020 CLINICAL DATA:  Seizure with abnormal neuro exam EXAM: MRI HEAD WITHOUT CONTRAST TECHNIQUE: Multiplanar, multiecho pulse sequences of the brain and surrounding structures were obtained without intravenous contrast. COMPARISON:  04/23/2020 FINDINGS: Brain: No acute infarction, hemorrhage, hydrocephalus, extra-axial collection or mass lesion. Chronic small vessel ischemia with confluent gliosis and chronic small vessel infarcts. There have been lacunes in the bilateral pons, bilateral deep white matter, and a remote perforator infarct at the left corona radiata. Generalized cerebral volume loss. Chronic microhemorrhages in the left cerebral white matter, likely post ischemic/hypertensive even though more peripheral in location. Vascular: Normal flow voids Skull and upper cervical spine: Normal marrow signal Sinuses/Orbits: Bilateral cataract resection IMPRESSION: 1. No acute or reversible finding. 2. Advanced chronic small vessel disease.  Electronically Signed   By: Monte Fantasia M.D.   On: 10/07/2020 10:16   EEG adult   Result Date: 10/07/2020 Lora Havens, MD     10/07/2020  3:50 PM Patient Name: Gregory Nunez. MRN: [id number] Epilepsy Attending: Lora Havens Referring Physician/Provider: Dr. Kathrynn Speed Date: 10/07/2020 Duration: 31.48 minutes Patient history: 75 year old male with recurrent episodes of right hemispheric visual scintillations followed by loss of consciousness.  EEG to evaluate for seizures. Level of alertness: Awake AEDs during EEG study: Keppra Technical aspects: This EEG study was done with scalp electrodes positioned according to the 10-20 International system of electrode placement. Electrical activity was acquired at a sampling rate of 500Hz  and reviewed with a high frequency filter of 70Hz  and a low frequency filter of 1Hz . EEG data were recorded continuously and digitally stored. Description: The posterior dominant rhythm consists of 9-10 Hz activity of moderate voltage (25-35 uV) seen predominantly in posterior head regions, symmetric and reactive to eye opening and eye closing. EEG showed intermittent 3 to 6 Hz theta-delta slowing in left temporal region.  Hyperventilation and photic stimulation were not performed.   ABNORMALITY - Intermittent slow, left temporal region IMPRESSION: This study is suggestive of nonspecific cortical dysfunction in left temporal region.  No seizures or epileptiform discharges were seen throughout the recording. Lora Havens       Microbiology:        Recent Results (from the past 240 hour(s))  Resp Panel by RT-PCR (Flu A&B, Covid) Nasopharyngeal Swab     Status: None    Collection Time: 10/05/20  8:04 PM    Specimen: Nasopharyngeal Swab; Nasopharyngeal(NP) swabs in vial transport medium  Result Value Ref Range Status    SARS Coronavirus 2 by RT PCR NEGATIVE NEGATIVE Final      Comment: (NOTE) SARS-CoV-2 target nucleic acids are NOT DETECTED.   The  SARS-CoV-2 RNA is generally detectable  in upper respiratory specimens during the acute phase of infection. The lowest concentration of SARS-CoV-2 viral copies this assay can detect is 138 copies/mL. A negative result does not preclude SARS-Cov-2 infection and should not be used as the sole basis for treatment or other patient management decisions. A negative result may occur with improper specimen collection/handling, submission of specimen other than nasopharyngeal swab, presence of viral mutation(s) within the areas targeted by this assay, and inadequate number of viral copies(<138 copies/mL). A negative result must be combined with clinical observations, patient history, and epidemiological information. The expected result is Negative.   Fact Sheet for Patients: EntrepreneurPulse.com.au   Fact Sheet for Healthcare Providers: IncredibleEmployment.be   This test is no t yet approved or cleared by the [place] FDA and has been authorized for detection and/or diagnosis of SARS-CoV-2 by FDA under an Emergency Use Authorization (EUA). This EUA will remain in effect (meaning this test can be used) for the duration of the COVID-19 declaration under Section 564(b)(1) of the Act, 21 U.S.C.section 360bbb-3(b)(1), unless the authorization is terminated or revoked sooner.          Influenza A by PCR NEGATIVE NEGATIVE Final    Influenza B by PCR NEGATIVE NEGATIVE Final      Comment: (NOTE) The Xpert Xpress SARS-CoV-2/FLU/RSV plus assay is intended as an aid in the diagnosis of influenza from Nasopharyngeal swab specimens and should not be used as a sole basis for treatment. Nasal washings and aspirates are unacceptable for Xpert Xpress SARS-CoV-2/FLU/RSV testing.   Fact Sheet for Patients: EntrepreneurPulse.com.au   Fact Sheet for Healthcare Providers: IncredibleEmployment.be   This test is not yet approved or  cleared by the [place] FDA and has been authorized for detection and/or diagnosis of SARS-CoV-2 by FDA under an Emergency Use Authorization (EUA). This EUA will remain in effect (meaning this test can be used) for the duration of the COVID-19 declaration under Section 564(b)(1) of the Act, 21 U.S.C. section 360bbb-3(b)(1), unless the authorization is terminated or revoked.   Performed at [facility], [place] [address] [place], [place] [address]        Labs: Basic Metabolic Panel: Last Labs         Recent Labs  Lab 10/05/20 1738 10/06/20 0500 10/07/20 0345 10/08/20 0432  NA 136 139 137 138  K 3.4* 3.6 3.6 3.8  CL 113* 112* 106 104  CO2 16* 21* 23 24  GLUCOSE 117* 84 91 81  BUN 21 22 25* 26*  CREATININE 1.51* 1.51* 1.73* 1.53*  CALCIUM 7.4* 8.6* 8.9 8.8*  MG 1.7  --  --  --      Liver Function Tests: Last Labs       Recent Labs  Lab 10/05/20 1738 10/06/20 0500  AST 35 39  ALT 29 33  ALKPHOS 44 54  BILITOT 0.5 0.7  PROT 6.2* 7.2  ALBUMIN 2.3* 2.6*      Last Labs   No results for input(s): LIPASE, AMYLASE in the last 168 hours.   Last Labs   No results for input(s): AMMONIA in the last 168 hours.   CBC: Last Labs         Recent Labs  Lab 10/05/20 1738 10/06/20 0500 10/07/20 0345 10/08/20 0432  WBC 3.3* 4.9 4.1 3.6*  HGB 6.7* 10.4* 10.5* 11.0*  HCT 21.6* 31.7* 31.1* 32.6*  MCV 92.3 89.5 86.4 86.9  PLT 183 164 167 158      Cardiac Enzymes: Last Labs  No results for input(s): CKTOTAL, CKMB, CKMBINDEX, TROPONINI in the last 168 hours.   BNP: BNP (last 3 results) Recent Labs (within last 365 days)  No results for input(s): BNP in the last 8760 hours.     ProBNP (last 3 results) Recent Labs (within last 365 days)  No results for input(s): PROBNP in the last 8760 hours.     CBG: Last Labs          Recent Labs  Lab 10/07/20 1212 10/07/20 1649 10/07/20 2038 10/08/20 0637 10/08/20 1152  GLUCAP 77 138* 198* 101* 218*               Signed:   Annita Brod, MD [facility] 10/08/2020, 1:57 PM             Note Details  Author Annita Brod, MD File Time 10/08/2020  2:09 PM  Author Type Physician Status Signed  Last Editor Annita Brod, MD Service Internal Medicine  Hospital Acct # [id number] Admit Date 10/05/2020

## 2020-10-08 NOTE — TOC CAGE-AID Note (Signed)
Transition of Care [facility]) - CAGE-AID Screening   Patient Details  Name: Gregory Nunez. MRN: [id number] Date of Birth: [birth year]  [facility] CM/SW Contact:    Carles Collet, RN Phone Number: 10/08/2020, 10:57 AM   Clinical Narrative:  Spoke to patient at bedside. Assessment completed. Patient provided with resources. He was appreciative and began reading them before I left the room. Encouraged to ask questions, questions answered.    CAGE-AID Screening:    Have You Ever Felt You Ought to Cut Down on Your Drinking or Drug Use?: Yes Have People Annoyed You By Critizing Your Drinking Or Drug Use?: Yes Have You Felt Bad Or Guilty About Your Drinking Or Drug Use?: No Have You Ever Had a Drink or Used Drugs First Thing In The Morning to Steady Your Nerves or to Get Rid of a Hangover?: No CAGE-AID Score: 2  Substance Abuse Education Offered: Yes  Substance abuse interventions: Scientist, clinical (histocompatibility and immunogenetics)

## 2020-10-08 NOTE — Plan of Care (Signed)
  Problem: Education: Goal: Knowledge of General Education information will improve Description Including pain rating scale, medication(s)/side effects and non-pharmacologic comfort measures Outcome: Progressing   Problem: Health Behavior/Discharge Planning: Goal: Ability to manage health-related needs will improve Outcome: Progressing   

## 2020-10-08 NOTE — Discharge Instructions (Signed)
No Driving for 6 months.

## 2020-10-08 NOTE — Progress Notes (Signed)
DISCHARGE NOTE HOME Oney Folz Lemond Griffee. to be discharged Home per MD order. Discussed prescriptions and follow up appointments with the patient. Prescriptions given to patient; medication list explained in detail. Patient verbalized understanding.  Skin clean, dry and intact without evidence of skin break down, no evidence of skin tears noted. IV catheter discontinued intact. Site without signs and symptoms of complications. Dressing and pressure applied. Pt denies pain at the site currently. No complaints noted.  Patient free of lines, drains, and wounds.   An After Visit Summary (AVS) was printed and given to the patient. Patient escorted via wheelchair, and discharged home via private auto.  Orville Govern, RN

## 2020-10-09 ENCOUNTER — Encounter (HOSPITAL_COMMUNITY): Payer: Self-pay | Admitting: Internal Medicine

## 2020-10-22 ENCOUNTER — Encounter (HOSPITAL_COMMUNITY): Payer: Self-pay

## 2020-10-22 ENCOUNTER — Inpatient Hospital Stay (HOSPITAL_COMMUNITY)
Admission: EM | Admit: 2020-10-22 | Discharge: 2020-10-24 | DRG: 637 | Disposition: A | Discharge status: Home / Self Care | Payer: Medicare Other | Attending: Internal Medicine | Admitting: Internal Medicine

## 2020-10-22 ENCOUNTER — Emergency Department (HOSPITAL_COMMUNITY): Payer: Medicare Other

## 2020-10-22 ENCOUNTER — Other Ambulatory Visit: Payer: Self-pay

## 2020-10-22 DIAGNOSIS — E11649 Type 2 diabetes mellitus with hypoglycemia without coma: Secondary | ICD-10-CM | POA: Diagnosis not present

## 2020-10-22 DIAGNOSIS — E1169 Type 2 diabetes mellitus with other specified complication: Secondary | ICD-10-CM | POA: Diagnosis present

## 2020-10-22 DIAGNOSIS — K219 Gastro-esophageal reflux disease without esophagitis: Secondary | ICD-10-CM | POA: Diagnosis present

## 2020-10-22 DIAGNOSIS — Z8673 Personal history of transient ischemic attack (TIA), and cerebral infarction without residual deficits: Secondary | ICD-10-CM

## 2020-10-22 DIAGNOSIS — E1122 Type 2 diabetes mellitus with diabetic chronic kidney disease: Secondary | ICD-10-CM | POA: Diagnosis present

## 2020-10-22 DIAGNOSIS — E785 Hyperlipidemia, unspecified: Secondary | ICD-10-CM | POA: Diagnosis present

## 2020-10-22 DIAGNOSIS — F17210 Nicotine dependence, cigarettes, uncomplicated: Secondary | ICD-10-CM | POA: Diagnosis present

## 2020-10-22 DIAGNOSIS — E1159 Type 2 diabetes mellitus with other circulatory complications: Secondary | ICD-10-CM | POA: Diagnosis present

## 2020-10-22 DIAGNOSIS — E113293 Type 2 diabetes mellitus with mild nonproliferative diabetic retinopathy without macular edema, bilateral: Secondary | ICD-10-CM | POA: Diagnosis present

## 2020-10-22 DIAGNOSIS — G9341 Metabolic encephalopathy: Secondary | ICD-10-CM | POA: Diagnosis present

## 2020-10-22 DIAGNOSIS — Z20822 Contact with and (suspected) exposure to covid-19: Secondary | ICD-10-CM | POA: Diagnosis present

## 2020-10-22 DIAGNOSIS — G40909 Epilepsy, unspecified, not intractable, without status epilepticus: Secondary | ICD-10-CM | POA: Diagnosis present

## 2020-10-22 DIAGNOSIS — I152 Hypertension secondary to endocrine disorders: Secondary | ICD-10-CM | POA: Diagnosis present

## 2020-10-22 DIAGNOSIS — D649 Anemia, unspecified: Secondary | ICD-10-CM

## 2020-10-22 DIAGNOSIS — E162 Hypoglycemia, unspecified: Secondary | ICD-10-CM | POA: Diagnosis present

## 2020-10-22 DIAGNOSIS — Z79899 Other long term (current) drug therapy: Secondary | ICD-10-CM

## 2020-10-22 DIAGNOSIS — I160 Hypertensive urgency: Secondary | ICD-10-CM | POA: Diagnosis present

## 2020-10-22 DIAGNOSIS — N1831 Chronic kidney disease, stage 3a: Secondary | ICD-10-CM | POA: Diagnosis present

## 2020-10-22 DIAGNOSIS — D509 Iron deficiency anemia, unspecified: Secondary | ICD-10-CM | POA: Diagnosis present

## 2020-10-22 DIAGNOSIS — Z8546 Personal history of malignant neoplasm of prostate: Secondary | ICD-10-CM

## 2020-10-22 DIAGNOSIS — Z7984 Long term (current) use of oral hypoglycemic drugs: Secondary | ICD-10-CM

## 2020-10-22 DIAGNOSIS — F1010 Alcohol abuse, uncomplicated: Secondary | ICD-10-CM | POA: Diagnosis present

## 2020-10-22 DIAGNOSIS — E119 Type 2 diabetes mellitus without complications: Secondary | ICD-10-CM

## 2020-10-22 HISTORY — DX: Hypoglycemia, unspecified: E16.2

## 2020-10-22 HISTORY — DX: Metabolic encephalopathy: G93.41

## 2020-10-22 LAB — URINALYSIS, ROUTINE W REFLEX MICROSCOPIC
Bacteria, UA: NONE SEEN
Bilirubin Urine: NEGATIVE
Glucose, UA: 50 mg/dL — AB
Hgb urine dipstick: NEGATIVE
Ketones, ur: NEGATIVE mg/dL
Leukocytes,Ua: NEGATIVE
Nitrite: NEGATIVE
Protein, ur: >=300 mg/dL — AB
Specific Gravity, Urine: 1.013 (ref 1.005–1.030)
pH: 5 (ref 5.0–8.0)

## 2020-10-22 LAB — COMPREHENSIVE METABOLIC PANEL
ALT: 28 U/L (ref 0–44)
AST: 38 U/L (ref 15–41)
Albumin: 3.1 g/dL — ABNORMAL LOW (ref 3.5–5.0)
Alkaline Phosphatase: 57 U/L (ref 38–126)
Anion gap: 7 (ref 5–15)
BUN: 21 mg/dL (ref 8–23)
CO2: 22 mmol/L (ref 22–32)
Calcium: 8.6 mg/dL — ABNORMAL LOW (ref 8.9–10.3)
Chloride: 110 mmol/L (ref 98–111)
Creatinine, Ser: 1.68 mg/dL — ABNORMAL HIGH (ref 0.61–1.24)
GFR, Estimated: 43 mL/min — ABNORMAL LOW (ref >60)
Glucose, Bld: 113 mg/dL — ABNORMAL HIGH (ref 70–99)
Potassium: 4.1 mmol/L (ref 3.5–5.1)
Sodium: 139 mmol/L (ref 135–145)
Total Bilirubin: 0.6 mg/dL (ref 0.3–1.2)
Total Protein: 7.8 g/dL (ref 6.5–8.1)

## 2020-10-22 LAB — CBC WITH DIFFERENTIAL/PLATELET
Abs Immature Granulocytes: 0.02 10*3/uL (ref 0.00–0.07)
Basophils Absolute: 0 10*3/uL (ref 0.0–0.1)
Basophils Relative: 0 %
Eosinophils Absolute: 0.1 10*3/uL (ref 0.0–0.5)
Eosinophils Relative: 2 %
HCT: 29.4 % — ABNORMAL LOW (ref 39.0–52.0)
Hemoglobin: 9.4 g/dL — ABNORMAL LOW (ref 13.0–17.0)
Immature Granulocytes: 1 %
Lymphocytes Relative: 21 %
Lymphs Abs: 0.8 10*3/uL (ref 0.7–4.0)
MCH: 29.4 pg (ref 26.0–34.0)
MCHC: 32 g/dL (ref 30.0–36.0)
MCV: 91.9 fL (ref 80.0–100.0)
Monocytes Absolute: 0.3 10*3/uL (ref 0.1–1.0)
Monocytes Relative: 8 %
Neutro Abs: 2.8 10*3/uL (ref 1.7–7.7)
Neutrophils Relative %: 68 %
Platelets: 144 10*3/uL — ABNORMAL LOW (ref 150–400)
RBC: 3.2 MIL/uL — ABNORMAL LOW (ref 4.22–5.81)
RDW: 14.6 % (ref 11.5–15.5)
WBC: 4 10*3/uL (ref 4.0–10.5)
nRBC: 0 % (ref 0.0–0.2)

## 2020-10-22 LAB — RESP PANEL BY RT-PCR (FLU A&B, COVID) ARPGX2
Influenza A by PCR: NEGATIVE
Influenza B by PCR: NEGATIVE
SARS Coronavirus 2 by RT PCR: NEGATIVE

## 2020-10-22 LAB — CBG MONITORING, ED
Glucose-Capillary: 100 mg/dL — ABNORMAL HIGH (ref 70–99)
Glucose-Capillary: 121 mg/dL — ABNORMAL HIGH (ref 70–99)

## 2020-10-22 LAB — ETHANOL: Alcohol, Ethyl (B): <10 mg/dL (ref <10)

## 2020-10-22 LAB — POC OCCULT BLOOD, ED: Fecal Occult Bld: NEGATIVE

## 2020-10-22 MED ORDER — THIAMINE HCL 100 MG PO TABS
100.0000 mg | ORAL_TABLET | Freq: Every day | ORAL | Status: DC
  Administered 2020-10-23: 100 mg via ORAL
  Filled 2020-10-22: qty 1

## 2020-10-22 MED ORDER — LORAZEPAM 2 MG/ML IJ SOLN: 1.0000 mg | INTRAMUSCULAR | Status: DC | PRN

## 2020-10-22 MED ORDER — ADULT MULTIVITAMIN W/MINERALS CH
1.0000 | ORAL_TABLET | Freq: Every day | ORAL | Status: DC
  Administered 2020-10-23: 1 via ORAL
  Filled 2020-10-22: qty 1

## 2020-10-22 MED ORDER — ACETAMINOPHEN 650 MG RE SUPP: 650.0000 mg | Freq: Four times a day (QID) | RECTAL | Status: DC | PRN

## 2020-10-22 MED ORDER — ACETAMINOPHEN 325 MG PO TABS: 650.0000 mg | ORAL_TABLET | Freq: Four times a day (QID) | ORAL | Status: DC | PRN

## 2020-10-22 MED ORDER — LORAZEPAM 1 MG PO TABS: 1.0000 mg | ORAL_TABLET | ORAL | Status: DC | PRN

## 2020-10-22 MED ORDER — FOLIC ACID 1 MG PO TABS
1.0000 mg | ORAL_TABLET | Freq: Every day | ORAL | Status: DC
  Administered 2020-10-23: 1 mg via ORAL
  Filled 2020-10-22: qty 1

## 2020-10-22 MED ORDER — THIAMINE HCL 100 MG/ML IJ SOLN: 100.0000 mg | Freq: Every day | INTRAMUSCULAR | Status: DC

## 2020-10-22 NOTE — ED Provider Notes (Signed)
[facility] DEPT Provider Note   CSN: [id number] Arrival date & time: 10/22/20  1935     History Chief Complaint  Patient presents with   Hypoglycemia    Gregory Nunez Gregory Nunez. is a 75 y.o. male.   Hypoglycemia Initial blood sugar:  32 Blood sugar after intervention:  100 Severity:  Moderate Onset quality:  Gradual Duration:  9 hours Timing:  Intermittent Progression:  Waxing and waning Chronicity:  Recurrent Diabetic status:  Unable to specify Relieved by:  Eating and oral glucose Associated symptoms: altered mental status and decreased responsiveness   Associated symptoms: no anxiety, no dizziness, no obesity, no seizures, no shortness of breath, no speech difficulty, no sweats, no tremors, no visual change, no vomiting and no weakness       Past Medical History:  Diagnosis Date   Cataract    OD   Depression    Diabetes mellitus without complication ([place])    Diabetic retinopathy ([place])    NPDR OU   GERD (gastroesophageal reflux disease)    if drinks alcohol   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   Patient is approximately 5-week status post bunion correction left foot   Hyperlipidemia    Hypertension    Hypertensive retinopathy    OU   Malignant neoplasm of prostate ([place]) 01/09/2014   Pancreatitis    Prostate cancer ([place]) 12/19/2013   Gleason 4+3=7, volume 31.31 cc   Shortness of breath dyspnea    with exertion     Patient Active Problem List   Diagnosis Date Noted   Hypoglycemia 10/22/2020   Angiodysplasia of intestine    GI bleed 10/05/2020   Melena    Upper GI bleed    Iron deficiency anemia 05/22/2020   Constipation 05/22/2020   GIB (gastrointestinal bleeding) 05/21/2020   Tobacco abuse    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 ([place])    History of CVA (cerebrovascular accident) 04/23/2020   Hyperlipidemia associated with type 2 diabetes mellitus ([place]) 04/23/2020   Alcohol use 04/23/2020    AKI (acute kidney injury) ([place]) 04/23/2020   Tobacco use 04/23/2020   Unresponsive 11/08/2018   Syncopal seizure ([place]) 11/08/2018   Hypertension associated with diabetes ([place]) 11/08/2018   Alcohol withdrawal seizure with complication, with unspecified complication ([place]) 26/83/4196   Type 2 diabetes mellitus without complication ([place]) 22/29/7989   Decreased hemoglobin 04/15/2018   Prostate cancer ([place]) 02/27/2014   Malignant neoplasm of prostate ([place]) 01/09/2014   Bunion 01/17/2013   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 Quentin Ore   COLONOSCOPY N/A 05/23/2020   Procedure: COLONOSCOPY;  Surgeon: Jackquline Denmark, MD;  Location: [facility];  Service: Gastroenterology;  Laterality: N/A;   ENTEROSCOPY N/A 10/07/2020   Procedure: ENTEROSCOPY;  Surgeon: [name], MD;  Location: [facility] ENDOSCOPY;  Service: Endoscopy;  Laterality: N/A;   ESOPHAGOGASTRODUODENOSCOPY Left 04/16/2018   Procedure: ESOPHAGOGASTRODUODENOSCOPY (EGD);  Surgeon: Juanita Craver, MD;  Location: [facility];  Service: Endoscopy;  Laterality: Left;   ESOPHAGOGASTRODUODENOSCOPY (EGD) WITH PROPOFOL N/A 05/23/2020   Procedure: ESOPHAGOGASTRODUODENOSCOPY (  EGD) WITH PROPOFOL;  Surgeon: Jackquline Denmark, MD;  Location: [facility];  Service: Gastroenterology;  Laterality: N/A;   EYE SURGERY     FOOT SURGERY     HOT HEMOSTASIS N/A 04/16/2018   Procedure: HOT HEMOSTASIS (ARGON PLASMA COAGULATION/BICAP);  Surgeon: Juanita Craver, MD;  Location: [facility];  Service: Endoscopy;  Laterality: N/A;   HOT HEMOSTASIS N/A 05/23/2020   Procedure: HOT HEMOSTASIS  (ARGON PLASMA COAGULATION/BICAP);  Surgeon: Jackquline Denmark, MD;  Location: [facility];  Service: Gastroenterology;  Laterality: N/A;   LYMPHADENECTOMY Bilateral 02/27/2014   Procedure: BILATERAL LYMPHADENECTOMY;  Surgeon: Alexis Frock, MD;  Location: [facility];  Service: Urology;  Laterality: Bilateral;   POLYPECTOMY  05/23/2020   Procedure: POLYPECTOMY;  Surgeon: Jackquline Denmark, MD;  Location: [facility];  Service: Gastroenterology;;   PROSTATE BIOPSY  12/2013   Gleason 4+3=7, volume 31.31 cc   ROBOT ASSISTED LAPAROSCOPIC RADICAL PROSTATECTOMY N/A 02/27/2014   Procedure: ROBOTIC ASSISTED LAPAROSCOPIC RADICAL PROSTATECTOMY WITH INDOCYANINE GREEN DYE;  Surgeon: Alexis Frock, MD;  Location: [facility];  Service: Urology;  Laterality: N/A;       Family History  Problem Relation Age of Onset   Heart disease Mother    Cancer Sister        breast   Colon cancer Neg Hx    Esophageal cancer Neg Hx    Rectal cancer Neg Hx    Stomach cancer Neg Hx     Social History   Tobacco Use   Smoking status: Every Day    Packs/day: 0.50    Pack years: 0.00    Types: Cigarettes   Smokeless tobacco: Never   Tobacco comments:    smokes a couple every day or two  Vaping Use   Vaping Use: Never used  Substance Use Topics   Alcohol use: Not Currently    Alcohol/week: 1.0 standard drink    Types: 1 Cans of beer per week   Drug use: Not Currently    Types: Marijuana, Cocaine    Comment: past hx approx 30 years ago     Home Medications Prior to Admission medications   Medication Sig Start Date End Date Taking? Authorizing Provider  albuterol (VENTOLIN HFA) 108 (90 Base) MCG/ACT inhaler Inhale 2 puffs into the lungs every 6 (six) hours as needed for wheezing or shortness of breath. 08/21/19   [provider]  amLODipine (NORVASC) 10 MG tablet Take 10 mg by mouth daily. 09/30/19   [provider]  atorvastatin (LIPITOR) 40 MG tablet Take 1 tablet (40 mg total) by mouth daily. 10/08/20  01/06/21  Annita Brod, MD  carvedilol (COREG) 25 MG tablet Take 1 tablet (25 mg total) by mouth 2 (two) times daily with a meal. 05/08/20   [name], Rinka R, MD  Ensure Plus (ENSURE PLUS) LIQD Take 237 mLs by mouth 2 (two) times daily between meals.    [provider]  folic acid (FOLVITE) 1 MG tablet Take 1 tablet (1 mg total) by mouth daily. 05/27/20   Eugenie Filler, MD  glimepiride (AMARYL) 4 MG tablet Take 4 mg by mouth daily with breakfast.    [provider]  iron polysaccharides (NU-IRON) 150 MG capsule Take 1 capsule (150 mg total) by mouth daily. 05/26/20   Eugenie Filler, MD  levETIRAcetam (KEPPRA) 500 MG tablet Take 1 tablet (500 mg total) by mouth 2 (two) times daily. 10/08/20   Annita Brod, MD  losartan (COZAAR) 100 MG tablet Take 100 mg by mouth  daily.    [provider]  metFORMIN (GLUCOPHAGE) 500 MG tablet Take 500 mg by mouth 2 (two) times daily. 02/13/19   [provider]  Multiple Vitamin (MULTIVITAMIN WITH MINERALS) TABS tablet Take 1 tablet by mouth daily. 05/27/20   Eugenie Filler, MD  pantoprazole (PROTONIX) 40 MG tablet Take 40 mg by mouth daily.    [provider]  thiamine 100 MG tablet Take 1 tablet (100 mg total) by mouth daily. 05/27/20   Eugenie Filler, MD  COMBIVENT RESPIMAT 20-100 MCG/ACT AERS respimat Inhale 1 puff into the lungs every 6 (six) hours as needed for shortness of breath. 06/12/18 08/19/19  [provider]  ferrous sulfate 325 (65 FE) MG tablet Take 1 tablet (325 mg total) by mouth 2 (two) times daily with a meal. Patient not taking: Reported on 08/19/2019 04/17/18 08/19/19  Dana Allan I, MD  mometasone-formoterol (DULERA) 100-5 MCG/ACT AERO Inhale 2 puffs into the lungs daily. Patient not taking: Reported on 08/19/2019 04/17/18 08/19/19  Dana Allan I, MD  sucralfate (CARAFATE) 1 g tablet Take 1 tablet (1 g total) by mouth 4 (four) times daily. Patient not taking: Reported on  08/19/2019 04/17/18 08/19/19  Dana Allan I, MD  tiotropium (SPIRIVA HANDIHALER) 18 MCG inhalation capsule Place 1 capsule (18 mcg total) into inhaler and inhale daily. Patient not taking: Reported on 08/19/2019 04/17/18 08/19/19  Dana Allan I, MD  traZODone (DESYREL) 50 MG tablet Take 50 mg by mouth at bedtime as needed for sleep. Patient not taking: Reported on 10/06/2020  10/08/20  [provider]    Allergies    Penicillins  Review of Systems   Review of Systems  Constitutional:  Positive for decreased responsiveness. Negative for chills, diaphoresis and fever.  HENT:  Negative for ear pain and sore throat.   Eyes:  Negative for pain and visual disturbance.  Respiratory:  Negative for cough and shortness of breath.   Cardiovascular:  Negative for chest pain and palpitations.  Gastrointestinal:  Negative for abdominal pain and vomiting.  Genitourinary:  Negative for dysuria and hematuria.  Musculoskeletal:  Negative for arthralgias and back pain.  Skin:  Negative for color change and rash.  Neurological:  Negative for dizziness, tremors, seizures, syncope, speech difficulty and weakness.  Psychiatric/Behavioral:  The patient is not nervous/anxious.   All other systems reviewed and are negative.  Physical Exam Updated Vital Signs BP (!) 157/83   Pulse 77   Temp 97.7 F (36.5 C) (Oral)   Resp (!) 22   Ht 5\' 11"  (1.803 m)   Wt 82.6 kg   SpO2 100%   BMI 25.38 kg/m   Physical Exam Vitals and nursing note reviewed.  Constitutional:      General: He is not in acute distress.    Appearance: He is well-developed. He is not ill-appearing.  HENT:     Head: Normocephalic and atraumatic.     Mouth/Throat:     Mouth: Mucous membranes are moist.  Eyes:     Extraocular Movements: Extraocular movements intact.     Conjunctiva/sclera: Conjunctivae normal.     Pupils: Pupils are equal, round, and reactive to light.  Cardiovascular:     Rate and Rhythm: Normal rate and  regular rhythm.     Pulses: Normal pulses.     Heart sounds: Normal heart sounds. No murmur heard. Pulmonary:     Effort: Pulmonary effort is normal. No respiratory distress.     Breath sounds: Normal breath sounds.  Abdominal:     Palpations: Abdomen is soft.     Tenderness: There is no abdominal tenderness.  Musculoskeletal:     Cervical back: Normal range of motion and neck supple.  Skin:    General: Skin is warm and dry.     Capillary Refill: Capillary refill takes less than 2 seconds.  Neurological:     General: No focal deficit present.     Mental Status: He is alert and oriented to person, place, and time.     Cranial Nerves: No cranial nerve deficit.     Sensory: No sensory deficit.     Motor: No weakness.     Coordination: Coordination normal.     Gait: Gait normal.  Psychiatric:        Mood and Affect: Mood normal.    ED Results / Procedures / Treatments   Labs (all labs ordered are listed, but only abnormal results are displayed) Labs Reviewed  CBC WITH DIFFERENTIAL/PLATELET - Abnormal; Notable for the following components:      Result Value   RBC 3.20 (*)    Hemoglobin 9.4 (*)    HCT 29.4 (*)    Platelets 144 (*)    All other components within normal limits  COMPREHENSIVE METABOLIC PANEL - Abnormal; Notable for the following components:   Glucose, Bld 113 (*)    Creatinine, Ser 1.68 (*)    Calcium 8.6 (*)    Albumin 3.1 (*)    GFR, Estimated 43 (*)    All other components within normal limits  URINALYSIS, ROUTINE W REFLEX MICROSCOPIC - Abnormal; Notable for the following components:   Glucose, UA 50 (*)    Protein, ur >=300 (*)    All other components within normal limits  CBG MONITORING, ED - Abnormal; Notable for the following components:   Glucose-Capillary 121 (*)    All other components within normal limits  RESP PANEL BY RT-PCR (FLU A&B, COVID) [id number]  ETHANOL  POC OCCULT BLOOD, ED  CBG MONITORING, ED    EKG EKG  Interpretation  Date/Time:  Wednesday October 22 2020 20:27:36 EDT Ventricular Rate:  75 PR Interval:  159 QRS Duration: 125 QT Interval:  417 QTC Calculation: 466 R Axis:   14 Text Interpretation: Sinus rhythm Abnormal QRS-T angle, consider primary T wave abnormality unchanged from prior EKG Confirmed by Lennice Sites ([id number]) on 10/22/2020 8:33:09 PM  Radiology CT Head Wo Contrast  Result Date: 10/22/2020 CLINICAL DATA:  Altered mental status EXAM: CT HEAD WITHOUT CONTRAST TECHNIQUE: Contiguous axial images were obtained from the base of the skull through the vertex without intravenous contrast. COMPARISON:  CT 10/05/2020, MRI 10/07/2020 FINDINGS: Brain: No acute territorial infarction, hemorrhage or intracranial mass. Atrophy and chronic small vessel ischemic changes of the white matter. Small chronic white matter infarcts and left deep white matter. Chronic lacunar infarct in the pons. Stable ventricle size. Vascular: No hyperdense vessels. Vertebral and carotid vascular calcification Skull: Normal. Negative for fracture or focal lesion. Sinuses/Orbits: No acute finding. Other: None IMPRESSION: 1. No CT evidence for acute intracranial abnormality. 2. Atrophy and chronic small vessel ischemic changes of the white matter. Multiple small chronic infarcts. Electronically Signed   By: Donavan Foil M.D.   On: 10/22/2020 21:10    Procedures Procedures   Medications Ordered in ED Medications - No data to display  ED Course  I have reviewed the triage vital signs and the nursing notes.  Pertinent labs & imaging results that were available during my care  of the patient were reviewed by me and considered in my medical decision making (see chart for details).    MDM Rules/Calculators/A&P                          Gregory Flirt [name]. is here for hypoglycemia.  Blood sugar has dropped twice today.  Dropped to about 27 this morning and EMS came out to the house and was given oral glucose with  improvement but refused transport.  He got confused and altered again and fell off his bed and hit his head prior to arrival was found to have blood sugar again in the 30s.  Blood sugar 100 upon arrival here.  He is awake and alert and eating a sandwich on my evaluation.  According to chart review he is on metformin and Amaryl.  Does have a history of alcohol abuse, seizures.  Neurologically he is intact.  EKG shows sinus rhythm.  Overall concerned that he is on a sulfonylurea and has had two episodes of hypoglycemia today.  We will get a head CT and basic labs.  Will likely consider observation stay given that he is dropped his blood sugar twice today.  Hemoglobin 9.4 slightly lower than his hemoglobin at discharge several weeks ago at 11.  He was admitted for GI bleed at that time.  Stool is grossly brown on exam.  CT scan unremarkable.  Hemoccult negative.  No evidence of urine infection.  Blood sugar steady in the low 100s.  Given multiple drops in his blood sugar today and despite having multiple meals and drinks in the ED still having barely normal blood sugars will admit for observation stay to medicine.  This chart was dictated using voice recognition software.  Despite best efforts to proofread,  errors can occur which can change the documentation meaning.   Final Clinical Impression(s) / ED Diagnoses Final diagnoses:  Hypoglycemia    Rx / DC Orders ED Discharge Orders     None        Lennice Sites, DO 10/22/20 2221

## 2020-10-22 NOTE — ED Notes (Signed)
Niece [name] called and received an update.

## 2020-10-22 NOTE — ED Triage Notes (Addendum)
Patient BIB [facility] from home. 2nd time going to his house today for same complaint. Combative, confused, CBG 31 on arrival. EMS started 20G right forearm, gave the whole bag of D10. CBG 204 after this was administered. Patient is a Type 2 diabetic, takes metformin. Patient said he has been missing his doses of metformin. First time EMS was called out they gave oral glucose and he refused transport at 11am this morning. Second time he decided to go with EMS.  EMS vitals 170/100 HR 71 Sinus rhythm 20G right forearm 99% Room Air

## 2020-10-22 NOTE — ED Notes (Signed)
Patient given another sandwich and orange juice.

## 2020-10-22 NOTE — ED Notes (Addendum)
Patient given sandwich and orange juice.

## 2020-10-22 NOTE — H&P (Signed)
History and Physical    Gregory Nunez. MVH:[id number] DOB: [birth year] DOA: 10/22/2020  PCP: [name], MD Patient coming from: Home  Chief Complaint: Altered mental status  HPI: Gregory Nunez. is a 75 y.o. male with medical history significant of CVA, alcohol abuse, non-insulin-dependent type 2 diabetes, GI bleed with history of angiodysplasia of intestine, iron deficiency anemia, hypertension, hyperlipidemia, seizure, depression, GERD, history of prostate cancer presenting to the ED via EMS for evaluation of altered mental status.  EMS was called earlier today and patient was found to be hypoglycemic with CBG 27.  He was given oral glucose with improvement but refused transport.  EMS called again this evening as patient became confused and altered again, fell off his bed and hit his head prior to arrival.  Again found to be hypoglycemic with CBG 31 on EMS arrival. He was given D10 and CBG improved to 204.  Labs in the ED showing WBC 4.0, hemoglobin 9.4 (was 11.0 on 10/08/2020), platelet count 144K.  Sodium 139, potassium 4.9, chloride 110, bicarb 22, BUN 21, creatinine 1.6 (stable since labs done 2 weeks ago), glucose 113.  LFTs normal.  Blood ethanol level undetectable.  COVID and influenza PCR negative.  UA without signs of infection.  FOBT negative.  Patient was given food to eat and admitted for monitoring of hypoglycemia.  Head CT negative for acute finding.  Patient reports taking pills for diabetes but does not know the names.  Denies taking any extra pills.  He has no other complaints.  Denies dizziness, chest pain, shortness of breath, cough, nausea, vomiting, abdominal pain, or diarrhea.  Denies hematemesis, hematochezia, or melena.  Review of Systems:  All systems reviewed and apart from history of presenting illness, are negative.  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   GERD (gastroesophageal reflux disease)    if drinks alcohol   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   Patient is approximately 5-week status post bunion correction left foot   Hyperlipidemia    Hypertension    Hypertensive retinopathy    OU   Malignant neoplasm of prostate ([place]) 01/09/2014   Pancreatitis    Prostate cancer ([place]) 12/19/2013   Gleason 4+3=7, volume 31.31 cc   Shortness of breath dyspnea    with exertion     Past Surgical History:  Procedure Laterality Date   BIOPSY  04/16/2018   Procedure: BIOPSY;  Surgeon: Juanita Craver, MD;  Location: [facility];  Service: Endoscopy;;   BIOPSY  05/23/2020   Procedure: BIOPSY;  Surgeon: Jackquline Denmark, MD;  Location: [facility];  Service: Gastroenterology;;  EGD and COLON   biopsy on throat     hx of    CATARACT EXTRACTION Bilateral    Dr. Quentin Ore   COLONOSCOPY N/A 05/23/2020   Procedure: COLONOSCOPY;  Surgeon: Jackquline Denmark, MD;  Location: [facility];  Service: Gastroenterology;  Laterality: N/A;   ENTEROSCOPY N/A 10/07/2020   Procedure: ENTEROSCOPY;  Surgeon: [name], MD;  Location: [facility] ENDOSCOPY;  Service: Endoscopy;  Laterality: N/A;   ESOPHAGOGASTRODUODENOSCOPY Left 04/16/2018   Procedure: ESOPHAGOGASTRODUODENOSCOPY (EGD);  Surgeon: Juanita Craver, MD;  Location: [facility];  Service: Endoscopy;  Laterality: Left;   ESOPHAGOGASTRODUODENOSCOPY (EGD) WITH PROPOFOL N/A 05/23/2020   Procedure: ESOPHAGOGASTRODUODENOSCOPY (EGD) WITH PROPOFOL;  Surgeon: Jackquline Denmark, MD;  Location: [facility];  Service: Gastroenterology;  Laterality: N/A;   EYE SURGERY     FOOT SURGERY     HOT HEMOSTASIS N/A 04/16/2018   Procedure: HOT HEMOSTASIS (ARGON PLASMA COAGULATION/BICAP);  Surgeon: Juanita Craver, MD;  Location: [facility];  Service: Endoscopy;  Laterality: N/A;   HOT HEMOSTASIS N/A 05/23/2020   Procedure: HOT HEMOSTASIS (ARGON PLASMA COAGULATION/BICAP);  Surgeon: Jackquline Denmark, MD;  Location: [facility]  ENDOSCOPY;  Service: Gastroenterology;  Laterality: N/A;   LYMPHADENECTOMY Bilateral 02/27/2014   Procedure: BILATERAL LYMPHADENECTOMY;  Surgeon: Alexis Frock, MD;  Location: [facility];  Service: Urology;  Laterality: Bilateral;   POLYPECTOMY  05/23/2020   Procedure: POLYPECTOMY;  Surgeon: Jackquline Denmark, MD;  Location: [facility];  Service: Gastroenterology;;   PROSTATE BIOPSY  12/2013   Gleason 4+3=7, volume 31.31 cc   ROBOT ASSISTED LAPAROSCOPIC RADICAL PROSTATECTOMY N/A 02/27/2014   Procedure: ROBOTIC ASSISTED LAPAROSCOPIC RADICAL PROSTATECTOMY WITH INDOCYANINE GREEN DYE;  Surgeon: Alexis Frock, MD;  Location: [facility];  Service: Urology;  Laterality: N/A;     reports that he has been smoking. He has been smoking an average of 0.50 packs per day. He has never used smokeless tobacco. He reports previous alcohol use of about 1.0 standard drink of alcohol per week. He reports previous drug use. Drugs: Marijuana and Cocaine.  Allergies  Allergen Reactions   Penicillins Anxiety and Other (See Comments)    DID THE REACTION INVOLVE: Swelling of the face/tongue/throat, SOB, or low BP? No Sudden or severe rash/hives, skin peeling, or the inside of the mouth or nose? No Did it require medical treatment? No When did it last happen?      many years ago If all above answers are "NO", may proceed with cephalosporin use.       Family History  Problem Relation Age of Onset   Heart disease Mother    Cancer Sister        breast   Colon cancer Neg Hx    Esophageal cancer Neg Hx    Rectal cancer Neg Hx    Stomach cancer Neg Hx     Prior to Admission medications   Medication Sig Start Date End Date Taking? Authorizing Provider  amLODipine (NORVASC) 10 MG tablet Take 10 mg by mouth daily. 09/30/19   [provider]  atorvastatin (LIPITOR) 40 MG tablet Take 1 tablet (40 mg total) by mouth daily. 10/08/20 01/06/21  Annita Brod, MD  carvedilol (COREG) 25 MG tablet Take 1 tablet (25 mg  total) by mouth 2 (two) times daily with a meal. 05/08/20   [name], Rinka R, MD  Ensure Plus (ENSURE PLUS) LIQD Take 237 mLs by mouth 2 (two) times daily between meals.    [provider]  folic acid (FOLVITE) 1 MG tablet Take 1 tablet (1 mg total) by mouth daily. 05/27/20   Eugenie Filler, MD  glimepiride (AMARYL) 4 MG tablet Take 4 mg by mouth daily with breakfast.    [provider]  iron polysaccharides (NU-IRON) 150 MG capsule Take 1 capsule (150 mg total) by mouth daily. 05/26/20   Eugenie Filler, MD  levETIRAcetam (KEPPRA) 500 MG tablet Take 1 tablet (500 mg total) by mouth 2 (two) times daily. 10/08/20   Annita Brod, MD  losartan (COZAAR) 100 MG tablet Take 100 mg by mouth daily.    [provider]  metFORMIN (GLUCOPHAGE) 500 MG tablet Take 500 mg by mouth 2 (two) times daily. 02/13/19   [provider]  Multiple Vitamin (MULTIVITAMIN WITH  MINERALS) TABS tablet Take 1 tablet by mouth daily. 05/27/20   Eugenie Filler, MD  pantoprazole (PROTONIX) 40 MG tablet Take 40 mg by mouth daily.    [provider]  thiamine 100 MG tablet Take 1 tablet (100 mg total) by mouth daily. 05/27/20   Eugenie Filler, MD  COMBIVENT RESPIMAT 20-100 MCG/ACT AERS respimat Inhale 1 puff into the lungs every 6 (six) hours as needed for shortness of breath. 06/12/18 08/19/19  [provider]  ferrous sulfate 325 (65 FE) MG tablet Take 1 tablet (325 mg total) by mouth 2 (two) times daily with a meal. Patient not taking: Reported on 08/19/2019 04/17/18 08/19/19  Dana Allan I, MD  mometasone-formoterol (DULERA) 100-5 MCG/ACT AERO Inhale 2 puffs into the lungs daily. Patient not taking: Reported on 08/19/2019 04/17/18 08/19/19  Dana Allan I, MD  sucralfate (CARAFATE) 1 g tablet Take 1 tablet (1 g total) by mouth 4 (four) times daily. Patient not taking: Reported on 08/19/2019 04/17/18 08/19/19  Dana Allan I, MD  tiotropium (SPIRIVA HANDIHALER) 18  MCG inhalation capsule Place 1 capsule (18 mcg total) into inhaler and inhale daily. Patient not taking: Reported on 08/19/2019 04/17/18 08/19/19  Dana Allan I, MD  traZODone (DESYREL) 50 MG tablet Take 50 mg by mouth at bedtime as needed for sleep. Patient not taking: Reported on 10/06/2020  10/08/20  [provider]    Physical Exam: Vitals:   10/22/20 2015 10/22/20 2030 10/22/20 2155 10/22/20 2230  BP: (!) 159/83 (!) 165/75 (!) 157/83 (!) 141/83  Pulse: 77 81 77 73  Resp: 13 15 (!) 22 14  Temp:      TempSrc:      SpO2: 99% 100% 100% 99%  Weight:      Height:        Physical Exam Constitutional:      General: He is not in acute distress. HENT:     Head: Normocephalic and atraumatic.  Eyes:     Extraocular Movements: Extraocular movements intact.     Conjunctiva/sclera: Conjunctivae normal.  Cardiovascular:     Rate and Rhythm: Normal rate and regular rhythm.     Pulses: Normal pulses.  Pulmonary:     Effort: Pulmonary effort is normal. No respiratory distress.     Breath sounds: Normal breath sounds. No wheezing or rales.  Abdominal:     General: Bowel sounds are normal. There is no distension.     Palpations: Abdomen is soft.     Tenderness: There is no abdominal tenderness.  Musculoskeletal:        General: No swelling or tenderness.     Cervical back: Normal range of motion and neck supple.  Skin:    General: Skin is warm and dry.  Neurological:     General: No focal deficit present.     Mental Status: He is alert and oriented to person, place, and time.     Labs on Admission: I have personally reviewed following labs and imaging studies  CBC: Recent Labs  Lab 10/22/20 2015  WBC 4.0  NEUTROABS 2.8  HGB 9.4*  HCT 29.4*  MCV 91.9  PLT 355*   Basic Metabolic Panel: Recent Labs  Lab 10/22/20 2015  NA 139  K 4.1  CL 110  CO2 22  GLUCOSE 113*  BUN 21  CREATININE 1.68*  CALCIUM 8.6*   GFR: Estimated Creatinine Clearance: 42.3 mL/min  (A) (by C-G formula based on SCr of 1.68 mg/dL (H)). Liver Function Tests: Recent  Labs  Lab 10/22/20 2015  AST 38  ALT 28  ALKPHOS 57  BILITOT 0.6  PROT 7.8  ALBUMIN 3.1*   No results for input(s): LIPASE, AMYLASE in the last 168 hours. No results for input(s): AMMONIA in the last 168 hours. Coagulation Profile: No results for input(s): INR, PROTIME in the last 168 hours. Cardiac Enzymes: No results for input(s): CKTOTAL, CKMB, CKMBINDEX, TROPONINI in the last 168 hours. BNP (last 3 results) No results for input(s): PROBNP in the last 8760 hours. HbA1C: No results for input(s): HGBA1C in the last 72 hours. CBG: Recent Labs  Lab 10/22/20 1946 10/22/20 2145  GLUCAP 100* 121*   Lipid Profile: No results for input(s): CHOL, HDL, LDLCALC, TRIG, CHOLHDL, LDLDIRECT in the last 72 hours. Thyroid Function Tests: No results for input(s): TSH, T4TOTAL, FREET4, T3FREE, THYROIDAB in the last 72 hours. Anemia Panel: No results for input(s): VITAMINB12, FOLATE, FERRITIN, TIBC, IRON, RETICCTPCT in the last 72 hours. Urine analysis:    Component Value Date/Time   COLORURINE YELLOW 10/22/2020 2034   APPEARANCEUR CLEAR 10/22/2020 2034   LABSPEC 1.013 10/22/2020 2034   PHURINE 5.0 10/22/2020 2034   GLUCOSEU 50 (A) 10/22/2020 2034   HGBUR NEGATIVE 10/22/2020 2034   BILIRUBINUR NEGATIVE 10/22/2020 2034   KETONESUR NEGATIVE 10/22/2020 2034   PROTEINUR >=300 (A) 10/22/2020 2034   UROBILINOGEN 1.0 01/10/2019 1132   NITRITE NEGATIVE 10/22/2020 2034   LEUKOCYTESUR NEGATIVE 10/22/2020 2034    Radiological Exams on Admission: CT Head Wo Contrast  Result Date: 10/22/2020 CLINICAL DATA:  Altered mental status EXAM: CT HEAD WITHOUT CONTRAST TECHNIQUE: Contiguous axial images were obtained from the base of the skull through the vertex without intravenous contrast. COMPARISON:  CT 10/05/2020, MRI 10/07/2020 FINDINGS: Brain: No acute territorial infarction, hemorrhage or intracranial mass. Atrophy  and chronic small vessel ischemic changes of the white matter. Small chronic white matter infarcts and left deep white matter. Chronic lacunar infarct in the pons. Stable ventricle size. Vascular: No hyperdense vessels. Vertebral and carotid vascular calcification Skull: Normal. Negative for fracture or focal lesion. Sinuses/Orbits: No acute finding. Other: None IMPRESSION: 1. No CT evidence for acute intracranial abnormality. 2. Atrophy and chronic small vessel ischemic changes of the white matter. Multiple small chronic infarcts. Electronically Signed   By: Donavan Foil M.D.   On: 10/22/2020 21:10    EKG: Independently reviewed.  Sinus rhythm, T wave abnormalities similar to prior tracing.  No acute ischemic changes.  Assessment/Plan Principal Problem:   Hypoglycemia Active Problems:   Hypertension associated with diabetes ([place])   Hyperlipidemia associated with type 2 diabetes mellitus ([place])   Acute metabolic encephalopathy   Acute on chronic anemia   Recurrent hypoglycemia in the setting of non-insulin-dependent type 2 diabetes with sulfonylurea use Takes glimepiride at home.  CBG down to 20-30 range with EMS and was given dextrose.  Blood glucose now improved and above 100.  A1c 6.5 on 04/24/2020. -Encourage oral intake/regular diet for now and monitor CBG every 2 hours.  Repeat A1c.  Discontinue glimepiride going forward.  Hold metformin at this time.  Acute metabolic encephalopathy Likely due to hypoglycemia.  Mental status now improved after improvement of blood glucose.  Head CT without acute finding. -Continue to monitor  Acute on chronic anemia History of angiodysplasia of intestine admitted for GI bleed 2 weeks ago.  Hemoglobin was 11.0 on 10/08/2020 and now down to 9.4 but no signs of active bleeding.  FOBT negative. -Continue to monitor  Seizure disorder -Resume Keppra  after pharmacy med rec is done.  Alcohol abuse No signs of withdrawal at this time. -CIWA protocol; Ativan  as needed.  Thiamine, folate, and multivitamin.  Hypertension Stable. -Resume home meds after pharmacy med rec is done.  Hyperlipidemia -Resume statin after pharmacy med rec is done.  GERD -Resume PPI after pharmacy med rec is done.  CKD stage IIIa Creatinine 1.6, stable since labs done 2 weeks ago.  DVT prophylaxis: SCDs Code Status: Full code Family Communication: No family available at this time. Disposition Plan: Status is: Observation  The patient remains OBS appropriate and will d/c before 2 midnights.  Dispo: The patient is from: Home              Anticipated d/c is to: Home              Patient currently is not medically stable to d/c.   Difficult to place patient No  Level of care: Level of care: Progressive  The medical decision making on this patient was of high complexity and the patient is at high risk for clinical deterioration, therefore this is a level 3 visit.  Shela Leff MD [facility]  If 7PM-7AM, please contact night-coverage [email or web address]  10/22/2020, 11:40 PM

## 2020-10-22 NOTE — ED Notes (Signed)
[place] the niece phone number.

## 2020-10-22 NOTE — ED Notes (Signed)
CBG 100 

## 2020-10-23 ENCOUNTER — Encounter (HOSPITAL_COMMUNITY): Payer: Self-pay | Admitting: Internal Medicine

## 2020-10-23 DIAGNOSIS — G9341 Metabolic encephalopathy: Secondary | ICD-10-CM

## 2020-10-23 DIAGNOSIS — E11649 Type 2 diabetes mellitus with hypoglycemia without coma: Secondary | ICD-10-CM | POA: Diagnosis present

## 2020-10-23 DIAGNOSIS — E113293 Type 2 diabetes mellitus with mild nonproliferative diabetic retinopathy without macular edema, bilateral: Secondary | ICD-10-CM | POA: Diagnosis present

## 2020-10-23 DIAGNOSIS — E1169 Type 2 diabetes mellitus with other specified complication: Secondary | ICD-10-CM | POA: Diagnosis present

## 2020-10-23 DIAGNOSIS — D509 Iron deficiency anemia, unspecified: Secondary | ICD-10-CM | POA: Diagnosis present

## 2020-10-23 DIAGNOSIS — N1831 Chronic kidney disease, stage 3a: Secondary | ICD-10-CM | POA: Diagnosis present

## 2020-10-23 DIAGNOSIS — F17210 Nicotine dependence, cigarettes, uncomplicated: Secondary | ICD-10-CM | POA: Diagnosis present

## 2020-10-23 DIAGNOSIS — E1122 Type 2 diabetes mellitus with diabetic chronic kidney disease: Secondary | ICD-10-CM | POA: Diagnosis present

## 2020-10-23 DIAGNOSIS — F1010 Alcohol abuse, uncomplicated: Secondary | ICD-10-CM | POA: Diagnosis present

## 2020-10-23 DIAGNOSIS — K219 Gastro-esophageal reflux disease without esophagitis: Secondary | ICD-10-CM | POA: Diagnosis present

## 2020-10-23 DIAGNOSIS — E162 Hypoglycemia, unspecified: Secondary | ICD-10-CM | POA: Diagnosis present

## 2020-10-23 DIAGNOSIS — E785 Hyperlipidemia, unspecified: Secondary | ICD-10-CM | POA: Diagnosis present

## 2020-10-23 DIAGNOSIS — Z20822 Contact with and (suspected) exposure to covid-19: Secondary | ICD-10-CM | POA: Diagnosis present

## 2020-10-23 DIAGNOSIS — Z8673 Personal history of transient ischemic attack (TIA), and cerebral infarction without residual deficits: Secondary | ICD-10-CM | POA: Diagnosis not present

## 2020-10-23 DIAGNOSIS — Z7984 Long term (current) use of oral hypoglycemic drugs: Secondary | ICD-10-CM | POA: Diagnosis not present

## 2020-10-23 DIAGNOSIS — I152 Hypertension secondary to endocrine disorders: Secondary | ICD-10-CM | POA: Diagnosis present

## 2020-10-23 DIAGNOSIS — Z8546 Personal history of malignant neoplasm of prostate: Secondary | ICD-10-CM | POA: Diagnosis not present

## 2020-10-23 DIAGNOSIS — G40909 Epilepsy, unspecified, not intractable, without status epilepticus: Secondary | ICD-10-CM | POA: Diagnosis present

## 2020-10-23 DIAGNOSIS — I160 Hypertensive urgency: Secondary | ICD-10-CM | POA: Diagnosis present

## 2020-10-23 DIAGNOSIS — Z79899 Other long term (current) drug therapy: Secondary | ICD-10-CM | POA: Diagnosis not present

## 2020-10-23 LAB — CBC
HCT: 35.2 % — ABNORMAL LOW (ref 39.0–52.0)
Hemoglobin: 11.3 g/dL — ABNORMAL LOW (ref 13.0–17.0)
MCH: 29.2 pg (ref 26.0–34.0)
MCHC: 32.1 g/dL (ref 30.0–36.0)
MCV: 91 fL (ref 80.0–100.0)
Platelets: 178 10*3/uL (ref 150–400)
RBC: 3.87 MIL/uL — ABNORMAL LOW (ref 4.22–5.81)
RDW: 14.2 % (ref 11.5–15.5)
WBC: 4.6 10*3/uL (ref 4.0–10.5)
nRBC: 0 % (ref 0.0–0.2)

## 2020-10-23 LAB — CBG MONITORING, ED
Glucose-Capillary: 153 mg/dL — ABNORMAL HIGH (ref 70–99)
Glucose-Capillary: 154 mg/dL — ABNORMAL HIGH (ref 70–99)
Glucose-Capillary: 125 mg/dL — ABNORMAL HIGH (ref 70–99)
Glucose-Capillary: 98 mg/dL (ref 70–99)
Glucose-Capillary: 43 mg/dL — CL (ref 70–99)
Glucose-Capillary: 50 mg/dL — ABNORMAL LOW (ref 70–99)
Glucose-Capillary: 168 mg/dL — ABNORMAL HIGH (ref 70–99)
Glucose-Capillary: 191 mg/dL — ABNORMAL HIGH (ref 70–99)

## 2020-10-23 LAB — GLUCOSE, CAPILLARY
Glucose-Capillary: 180 mg/dL — ABNORMAL HIGH (ref 70–99)
Glucose-Capillary: 148 mg/dL — ABNORMAL HIGH (ref 70–99)
Glucose-Capillary: 186 mg/dL — ABNORMAL HIGH (ref 70–99)
Glucose-Capillary: 139 mg/dL — ABNORMAL HIGH (ref 70–99)
Glucose-Capillary: 165 mg/dL — ABNORMAL HIGH (ref 70–99)

## 2020-10-23 LAB — HEMOGLOBIN A1C
Hgb A1c MFr Bld: 6.2 % — ABNORMAL HIGH (ref 4.8–5.6)
Mean Plasma Glucose: 131.24 mg/dL

## 2020-10-23 MED ORDER — ATORVASTATIN CALCIUM 40 MG PO TABS
40.0000 mg | ORAL_TABLET | Freq: Every day | ORAL | Status: DC
  Administered 2020-10-23: 40 mg via ORAL
  Filled 2020-10-23: qty 1

## 2020-10-23 MED ORDER — POLYSACCHARIDE IRON COMPLEX 150 MG PO CAPS
150.0000 mg | ORAL_CAPSULE | Freq: Every day | ORAL | Status: DC
  Administered 2020-10-23: 150 mg via ORAL
  Filled 2020-10-23: qty 1

## 2020-10-23 MED ORDER — AMLODIPINE BESYLATE 10 MG PO TABS
10.0000 mg | ORAL_TABLET | Freq: Every day | ORAL | Status: DC
  Administered 2020-10-23: 10 mg via ORAL
  Filled 2020-10-23: qty 2

## 2020-10-23 MED ORDER — LEVETIRACETAM 500 MG PO TABS
500.0000 mg | ORAL_TABLET | Freq: Two times a day (BID) | ORAL | Status: DC
  Administered 2020-10-23 (×2): 500 mg via ORAL
  Filled 2020-10-23 (×2): qty 1

## 2020-10-23 MED ORDER — PANTOPRAZOLE SODIUM 40 MG PO TBEC
40.0000 mg | DELAYED_RELEASE_TABLET | Freq: Every day | ORAL | Status: DC
  Administered 2020-10-23: 40 mg via ORAL
  Filled 2020-10-23: qty 1

## 2020-10-23 MED ORDER — ASPIRIN EC 81 MG PO TBEC
81.0000 mg | DELAYED_RELEASE_TABLET | Freq: Every day | ORAL | Status: DC
  Administered 2020-10-23: 81 mg via ORAL
  Filled 2020-10-23: qty 1

## 2020-10-23 MED ORDER — CARVEDILOL 25 MG PO TABS
25.0000 mg | ORAL_TABLET | Freq: Two times a day (BID) | ORAL | Status: DC
  Administered 2020-10-23 – 2020-10-24 (×3): 25 mg via ORAL
  Filled 2020-10-23 (×2): qty 1
  Filled 2020-10-23: qty 2

## 2020-10-23 MED ORDER — LOSARTAN POTASSIUM 50 MG PO TABS
100.0000 mg | ORAL_TABLET | Freq: Every day | ORAL | Status: DC
  Administered 2020-10-23: 100 mg via ORAL
  Filled 2020-10-23: qty 4

## 2020-10-23 NOTE — ED Notes (Signed)
Updates provided to nephew.

## 2020-10-23 NOTE — ED Notes (Signed)
CBG 153 

## 2020-10-23 NOTE — ED Notes (Signed)
CBG 98  

## 2020-10-23 NOTE — ED Notes (Signed)
Breakfast tray to the room at this time

## 2020-10-23 NOTE — ED Notes (Addendum)
CBG 125.

## 2020-10-23 NOTE — TOC Initial Note (Signed)
Transition of Care (TOC) - Initial/Assessment Note    Patient Details  Name: Gregory Nunez. MRN: [id number] Date of Birth: [birth year]  Transition of Care [facility]) CM/SW Contact:    Cambryn Charters, Marjie Skiff, RN Phone Number: 10/23/2020, 2:53 PM  Clinical Narrative:                 [facility] consult for Substance abuse resources. Per previous note from [facility] pt was provided with resources on 6/22. TOC will continue to follow.          Activities of Daily Living Home Assistive Devices/Equipment: CBG Meter ADL Screening (condition at time of admission) Patient's cognitive ability adequate to safely complete daily activities?: Yes Is the patient deaf or have difficulty hearing?: No Does the patient have difficulty seeing, even when wearing glasses/contacts?: No Does the patient have difficulty concentrating, remembering, or making decisions?: No Patient able to express need for assistance with ADLs?: Yes Does the patient have difficulty dressing or bathing?: No Independently performs ADLs?: Yes (appropriate for developmental age) Does the patient have difficulty walking or climbing stairs?: Yes (secondary to weakness) Weakness of Legs: Both Weakness of Arms/Hands: None   Admission diagnosis:  Hypoglycemia [E16.2] Patient Active Problem List   Diagnosis Date Noted   Hypoglycemia 24/58/0998   Acute metabolic encephalopathy 33/82/5053   Acute on chronic anemia 10/22/2020   Angiodysplasia of intestine    GI bleed 10/05/2020   Melena    Upper GI bleed    Iron deficiency anemia 05/22/2020   Constipation 05/22/2020   GIB (gastrointestinal bleeding) 05/21/2020   Tobacco abuse    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 ([place])    History of CVA (cerebrovascular accident) 04/23/2020   Hyperlipidemia associated with type 2 diabetes mellitus ([place]) 04/23/2020   Alcohol use 04/23/2020   AKI (acute kidney injury) ([place]) 04/23/2020   Tobacco use 04/23/2020   Unresponsive  11/08/2018   Syncopal seizure ([place]) 11/08/2018   Hypertension associated with diabetes ([place]) 11/08/2018   Alcohol withdrawal seizure with complication, with unspecified complication ([place]) 97/67/3419   Type 2 diabetes mellitus without complication ([place]) 37/90/2409   Decreased hemoglobin 04/15/2018   Prostate cancer ([place]) 02/27/2014   Malignant neoplasm of prostate ([place]) 01/09/2014   Bunion 01/17/2013   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   PCP:  [name], MD Pharmacy:   [id number] [place], [place] [address] [place] [address] Phone: [phone] Fax: [phone]     Social Determinants of Health (SDOH) Interventions    Readmission Risk Interventions No flowsheet data found.

## 2020-10-23 NOTE — Progress Notes (Signed)
Progress Note    Gregory Nunez.  URK:[id number] DOB: [birth year]  DOA: 10/22/2020 PCP: [name], MD      Brief Narrative:    Medical records reviewed and are as summarized below:  Gregory Pagan. is a 75 y.o. male with medical history significant of CVA, alcohol use disorder, CKD stage IIIa, non-insulin-dependent type 2 diabetes, GI bleed with history of angiodysplasia of intestine, iron deficiency anemia, hypertension, hyperlipidemia, seizure, depression, GERD, history of prostate cancer.  He presented to the emergency room because of altered mental status found hypoglycemia.  Reportedly, his glucose level was 27 when EMS checked it.  He was treated with dextrose but he initially refused transport to the hospital.  However, EMS was called again for recurrent hypoglycemia with glucose level of 31.  He was admitted to the hospital for recurrent hypoglycemia and acute metabolic encephalopathy.    Assessment/Plan:   Principal Problem:   Hypoglycemia Active Problems:   Hypertension associated with diabetes ([place])   Hyperlipidemia associated with type 2 diabetes mellitus ([place])   Acute metabolic encephalopathy   Acute on chronic anemia   Body mass index is 25.38 kg/m.   Recurrent hypoglycemia: Hemoglobin A1c is 6.2.  He was on glimepiride and metformin which have been held.  Monitor glucose level closely because of underlying CKD stage IIIa.  Glimepiride may have to be discontinued at discharge.  Acute metabolic encephalopathy: Improved.  This is likely due to hypoglycemia.  Hypertensive urgency: Continue antihypertensives  Seizure disorder: Continue Keppra  History of stroke January 2022: Continue Lipitor.  Patient was on baby aspirin as of October 06, 2020 but is not clear why it aspirin was not listed on his admission med rec.  Resume aspirin.    Diet Order             Diet regular Room service appropriate? Yes; Fluid consistency: Thin  Diet  effective now                      Consultants: None  Procedures: None    Medications:    amLODipine  10 mg Oral Daily   aspirin EC  81 mg Oral Daily   atorvastatin  40 mg Oral Daily   carvedilol  25 mg Oral BID WC   folic acid  1 mg Oral Daily   iron polysaccharides  150 mg Oral Daily   levETIRAcetam  500 mg Oral BID   losartan  100 mg Oral Daily   multivitamin with minerals  1 tablet Oral Daily   pantoprazole  40 mg Oral Daily   thiamine  100 mg Oral Daily   Or   thiamine  100 mg Intravenous Daily   Continuous Infusions:   Anti-infectives (From admission, onward)    None              Family Communication/Anticipated D/C date and plan/Code Status   DVT prophylaxis: SCDs Start: 10/22/20 2337     Code Status: Full Code  Family Communication: None Disposition Plan:    Status is: Inpatient  Remains inpatient appropriate because:Inpatient level of care appropriate due to severity of illness  Dispo: The patient is from: Home              Anticipated d/c is to: Home              Patient currently is not medically stable to d/c.   Difficult to place patient No  Subjective:   Interval events noted.  He has no complaints.  No dizziness, confusion, shortness of breath, chest pain  Objective:    Vitals:   10/23/20 0600 10/23/20 0800 10/23/20 1000 10/23/20 1100  BP: (!) 160/81 (!) 182/90 (!) 160/96 127/80  Pulse: 72 77 71 72  Resp: 12 17 16 17   Temp:      TempSrc:      SpO2: 97% 98% 98% 96%  Weight:      Height:       No data found.  No intake or output data in the 24 hours ending 10/23/20 1141 Filed Weights   10/22/20 1943  Weight: 82.6 kg    Exam:  GEN: NAD SKIN: No rash EYES: EOMI ENT: MMM CV: RRR PULM: CTA B ABD: soft, ND, NT, +BS CNS: AAO x 3, non focal. Slurred speech at baseline EXT: No edema or tenderness        Data Reviewed:   I have personally reviewed following labs and  imaging studies:  Labs: Labs show the following:   Basic Metabolic Panel: Recent Labs  Lab 10/22/20 2015  NA 139  K 4.1  CL 110  CO2 22  GLUCOSE 113*  BUN 21  CREATININE 1.68*  CALCIUM 8.6*   GFR Estimated Creatinine Clearance: 42.3 mL/min (A) (by C-G formula based on SCr of 1.68 mg/dL (H)). Liver Function Tests: Recent Labs  Lab 10/22/20 2015  AST 38  ALT 28  ALKPHOS 57  BILITOT 0.6  PROT 7.8  ALBUMIN 3.1*   No results for input(s): LIPASE, AMYLASE in the last 168 hours. No results for input(s): AMMONIA in the last 168 hours. Coagulation profile No results for input(s): INR, PROTIME in the last 168 hours.  CBC: Recent Labs  Lab 10/22/20 2015 10/23/20 0417  WBC 4.0 4.6  NEUTROABS 2.8  --   HGB 9.4* 11.3*  HCT 29.4* 35.2*  MCV 91.9 91.0  PLT 144* 178   Cardiac Enzymes: No results for input(s): CKTOTAL, CKMB, CKMBINDEX, TROPONINI in the last 168 hours. BNP (last 3 results) No results for input(s): PROBNP in the last 8760 hours. CBG: Recent Labs  Lab 10/23/20 0358 10/23/20 0540 10/23/20 0749 10/23/20 0808 10/23/20 0939  GLUCAP 125* 98 43* 50* 168*   D-Dimer: No results for input(s): DDIMER in the last 72 hours. Hgb A1c: Recent Labs    10/22/20 2340  HGBA1C 6.2*   Lipid Profile: No results for input(s): CHOL, HDL, LDLCALC, TRIG, CHOLHDL, LDLDIRECT in the last 72 hours. Thyroid function studies: No results for input(s): TSH, T4TOTAL, T3FREE, THYROIDAB in the last 72 hours.  Invalid input(s): FREET3 Anemia work up: No results for input(s): VITAMINB12, FOLATE, FERRITIN, TIBC, IRON, RETICCTPCT in the last 72 hours. Sepsis Labs: Recent Labs  Lab 10/22/20 2015 10/23/20 0417  WBC 4.0 4.6    Microbiology Recent Results (from the past 240 hour(s))  Resp Panel by RT-PCR (Flu A&B, Covid) Nasopharyngeal Swab     Status: None   Collection Time: 10/22/20  8:28 PM   Specimen: Nasopharyngeal Swab; Nasopharyngeal(NP) swabs in vial transport medium   Result Value Ref Range Status   SARS Coronavirus 2 by RT PCR NEGATIVE NEGATIVE Final    Comment: (NOTE) SARS-CoV-2 target nucleic acids are NOT DETECTED.  The SARS-CoV-2 RNA is generally detectable in upper respiratory specimens during the acute phase of infection. The lowest concentration of SARS-CoV-2 viral copies this assay can detect is 138 copies/mL. A negative result does not preclude SARS-Cov-2 infection and should  not be used as the sole basis for treatment or other patient management decisions. A negative result may occur with  improper specimen collection/handling, submission of specimen other than nasopharyngeal swab, presence of viral mutation(s) within the areas targeted by this assay, and inadequate number of viral copies(<138 copies/mL). A negative result must be combined with clinical observations, patient history, and epidemiological information. The expected result is Negative.  Fact Sheet for Patients:  EntrepreneurPulse.com.au  Fact Sheet for Healthcare Providers:  IncredibleEmployment.be  This test is no t yet approved or cleared by the [place] FDA and  has been authorized for detection and/or diagnosis of SARS-CoV-2 by FDA under an Emergency Use Authorization (EUA). This EUA will remain  in effect (meaning this test can be used) for the duration of the COVID-19 declaration under Section 564(b)(1) of the Act, 21 U.S.C.section 360bbb-3(b)(1), unless the authorization is terminated  or revoked sooner.       Influenza A by PCR NEGATIVE NEGATIVE Final   Influenza B by PCR NEGATIVE NEGATIVE Final    Comment: (NOTE) The Xpert Xpress SARS-CoV-2/FLU/RSV plus assay is intended as an aid in the diagnosis of influenza from Nasopharyngeal swab specimens and should not be used as a sole basis for treatment. Nasal washings and aspirates are unacceptable for Xpert Xpress SARS-CoV-2/FLU/RSV testing.  Fact Sheet for  Patients: EntrepreneurPulse.com.au  Fact Sheet for Healthcare Providers: IncredibleEmployment.be  This test is not yet approved or cleared by the [place] FDA and has been authorized for detection and/or diagnosis of SARS-CoV-2 by FDA under an Emergency Use Authorization (EUA). This EUA will remain in effect (meaning this test can be used) for the duration of the COVID-19 declaration under Section 564(b)(1) of the Act, 21 U.S.C. section 360bbb-3(b)(1), unless the authorization is terminated or revoked.  Performed at [facility], [place] [address], [place], [place] [address]     Procedures and diagnostic studies:  CT Head Wo Contrast  Result Date: 10/22/2020 CLINICAL DATA:  Altered mental status EXAM: CT HEAD WITHOUT CONTRAST TECHNIQUE: Contiguous axial images were obtained from the base of the skull through the vertex without intravenous contrast. COMPARISON:  CT 10/05/2020, MRI 10/07/2020 FINDINGS: Brain: No acute territorial infarction, hemorrhage or intracranial mass. Atrophy and chronic small vessel ischemic changes of the white matter. Small chronic white matter infarcts and left deep white matter. Chronic lacunar infarct in the pons. Stable ventricle size. Vascular: No hyperdense vessels. Vertebral and carotid vascular calcification Skull: Normal. Negative for fracture or focal lesion. Sinuses/Orbits: No acute finding. Other: None IMPRESSION: 1. No CT evidence for acute intracranial abnormality. 2. Atrophy and chronic small vessel ischemic changes of the white matter. Multiple small chronic infarcts. Electronically Signed   By: Donavan Foil M.D.   On: 10/22/2020 21:10               LOS: 0 days   Camaya Gannett  [facility]   Pager on [email or web address]. If 7PM-7AM, please contact night-coverage at [email or web address]     10/23/2020, 11:41 AM

## 2020-10-23 NOTE — ED Notes (Signed)
cbg-43, patient A&O and breakfast tray ordered. Patient provided sandwich and coffee with suger until tray arrives. MD aware.

## 2020-10-23 NOTE — ED Notes (Signed)
Lunch tray provided. 

## 2020-10-23 NOTE — ED Notes (Signed)
CBG 154. 

## 2020-10-24 LAB — GLUCOSE, CAPILLARY
Glucose-Capillary: 117 mg/dL — ABNORMAL HIGH (ref 70–99)
Glucose-Capillary: 139 mg/dL — ABNORMAL HIGH (ref 70–99)
Glucose-Capillary: 145 mg/dL — ABNORMAL HIGH (ref 70–99)
Glucose-Capillary: 93 mg/dL (ref 70–99)
Glucose-Capillary: 93 mg/dL (ref 70–99)
Glucose-Capillary: 99 mg/dL (ref 70–99)

## 2020-10-24 LAB — BASIC METABOLIC PANEL
Anion gap: 6 (ref 5–15)
BUN: 22 mg/dL (ref 8–23)
CO2: 24 mmol/L (ref 22–32)
Calcium: 8.7 mg/dL — ABNORMAL LOW (ref 8.9–10.3)
Chloride: 112 mmol/L — ABNORMAL HIGH (ref 98–111)
Creatinine, Ser: 1.64 mg/dL — ABNORMAL HIGH (ref 0.61–1.24)
GFR, Estimated: 44 mL/min — ABNORMAL LOW (ref >60)
Glucose, Bld: 94 mg/dL (ref 70–99)
Potassium: 3.8 mmol/L (ref 3.5–5.1)
Sodium: 142 mmol/L (ref 135–145)

## 2020-10-24 MED ORDER — POLYSACCHARIDE IRON COMPLEX 150 MG PO CAPS: 150.0000 mg | ORAL_CAPSULE | Freq: Every day | ORAL | 0 refills | Status: DC

## 2020-10-24 MED ORDER — CARVEDILOL 25 MG PO TABS: 25.0000 mg | ORAL_TABLET | Freq: Every day | ORAL | Status: DC

## 2020-10-24 MED ORDER — ASPIRIN 81 MG PO TBEC: 81.0000 mg | DELAYED_RELEASE_TABLET | Freq: Every day | ORAL | Status: DC

## 2020-10-24 NOTE — Discharge Summary (Signed)
Physician Discharge Summary  [name] SWN:[id number] DOB: [birth year] DOA: 10/22/2020  PCP: [name], MD  Admit date: 10/22/2020 Discharge date: 10/24/2020  Discharge disposition: Home   Recommendations for Outpatient Follow-Up:   Follow up with PCP in 1 week   Discharge Diagnosis:   Principal Problem:   Hypoglycemia Active Problems:   Hypertension associated with diabetes ([place])   Hyperlipidemia associated with type 2 diabetes mellitus ([place])   Acute metabolic encephalopathy   Acute on chronic anemia    Discharge Condition: Stable.  Diet recommendation:  Diet Order             Diet - low sodium heart healthy           Diet Carb Modified           Diet regular Room service appropriate? Yes; Fluid consistency: Thin  Diet effective now                     Code Status: Full Code     Hospital Course:   Gregory Nunez. is a 75 y.o. male with medical history significant of CVA, alcohol use disorder, CKD stage IIIa, non-insulin-dependent type 2 diabetes, GI bleed with history of angiodysplasia of intestine, iron deficiency anemia, hypertension, hyperlipidemia, seizure, depression, GERD, history of prostate cancer.  He presented to the emergency room because of altered mental status found hypoglycemia.  Reportedly, his glucose level was 27 when EMS checked it.  He was treated with dextrose but he initially refused transport to the hospital.  However, EMS was called again for recurrent hypoglycemia with glucose level of 31.   He was admitted to the hospital for recurrent hypoglycemia and acute metabolic encephalopathy.  He was treated with IV dextrose.  He was on metformin and glimepiride prior to admission.  His hemoglobin A1c is 6.2.  He has been advised not to take glimepiride again hypoglycemic episodes.  His condition has improved and he is deemed stable for discharge home today.  Follow-up with PCP strongly recommended for continued  glucose management.     Discharge Exam:    Vitals:   10/23/20 1325 10/23/20 1817 10/23/20 2016 10/24/20 0205  BP: 126/74 116/73 139/90 (!) 143/88  Pulse: 70 72 75 76  Resp: 16 16 16 16   Temp: 97.7 F (36.5 C) 98.2 F (36.8 C) 98.2 F (36.8 C) 98.7 F (37.1 C)  TempSrc: Oral Oral Oral Oral  SpO2: 100% 100% 100% 99%  Weight:      Height:         GEN: NAD SKIN: Warm and dry EYES: EOMI ENT: MMM CV: RRR PULM: CTA B ABD: soft, ND, NT, +BS CNS: AAO x 3, non focal EXT: No edema or tenderness   The results of significant diagnostics from this hospitalization (including imaging, microbiology, ancillary and laboratory) are listed below for reference.     Procedures and Diagnostic Studies:   CT Head Wo Contrast  Result Date: 10/22/2020 CLINICAL DATA:  Altered mental status EXAM: CT HEAD WITHOUT CONTRAST TECHNIQUE: Contiguous axial images were obtained from the base of the skull through the vertex without intravenous contrast. COMPARISON:  CT 10/05/2020, MRI 10/07/2020 FINDINGS: Brain: No acute territorial infarction, hemorrhage or intracranial mass. Atrophy and chronic small vessel ischemic changes of the white matter. Small chronic white matter infarcts and left deep white matter. Chronic lacunar infarct in the pons. Stable ventricle size. Vascular: No hyperdense vessels. Vertebral and carotid vascular calcification Skull: Normal. Negative  for fracture or focal lesion. Sinuses/Orbits: No acute finding. Other: None IMPRESSION: 1. No CT evidence for acute intracranial abnormality. 2. Atrophy and chronic small vessel ischemic changes of the white matter. Multiple small chronic infarcts. Electronically Signed   By: Donavan Foil M.D.   On: 10/22/2020 21:10     Labs:   Basic Metabolic Panel: Recent Labs  Lab 10/22/20 2015 10/24/20 0526  NA 139 142  K 4.1 3.8  CL 110 112*  CO2 22 24  GLUCOSE 113* 94  BUN 21 22  CREATININE 1.68* 1.64*  CALCIUM 8.6* 8.7*   GFR Estimated  Creatinine Clearance: 43.4 mL/min (A) (by C-G formula based on SCr of 1.64 mg/dL (H)). Liver Function Tests: Recent Labs  Lab 10/22/20 2015  AST 38  ALT 28  ALKPHOS 57  BILITOT 0.6  PROT 7.8  ALBUMIN 3.1*   No results for input(s): LIPASE, AMYLASE in the last 168 hours. No results for input(s): AMMONIA in the last 168 hours. Coagulation profile No results for input(s): INR, PROTIME in the last 168 hours.  CBC: Recent Labs  Lab 10/22/20 2015 10/23/20 0417  WBC 4.0 4.6  NEUTROABS 2.8  --   HGB 9.4* 11.3*  HCT 29.4* 35.2*  MCV 91.9 91.0  PLT 144* 178   Cardiac Enzymes: No results for input(s): CKTOTAL, CKMB, CKMBINDEX, TROPONINI in the last 168 hours. BNP: Invalid input(s): POCBNP CBG: Recent Labs  Lab 10/24/20 0025 10/24/20 0207 10/24/20 0433 10/24/20 0556 10/24/20 0727  GLUCAP 145* 117* 99 93 93   D-Dimer No results for input(s): DDIMER in the last 72 hours. Hgb A1c Recent Labs    10/22/20 2340  HGBA1C 6.2*   Lipid Profile No results for input(s): CHOL, HDL, LDLCALC, TRIG, CHOLHDL, LDLDIRECT in the last 72 hours. Thyroid function studies No results for input(s): TSH, T4TOTAL, T3FREE, THYROIDAB in the last 72 hours.  Invalid input(s): FREET3 Anemia work up No results for input(s): VITAMINB12, FOLATE, FERRITIN, TIBC, IRON, RETICCTPCT in the last 72 hours. Microbiology Recent Results (from the past 240 hour(s))  Resp Panel by RT-PCR (Flu A&B, Covid) Nasopharyngeal Swab     Status: None   Collection Time: 10/22/20  8:28 PM   Specimen: Nasopharyngeal Swab; Nasopharyngeal(NP) swabs in vial transport medium  Result Value Ref Range Status   SARS Coronavirus 2 by RT PCR NEGATIVE NEGATIVE Final    Comment: (NOTE) SARS-CoV-2 target nucleic acids are NOT DETECTED.  The SARS-CoV-2 RNA is generally detectable in upper respiratory specimens during the acute phase of infection. The lowest concentration of SARS-CoV-2 viral copies this assay can detect is 138  copies/mL. A negative result does not preclude SARS-Cov-2 infection and should not be used as the sole basis for treatment or other patient management decisions. A negative result may occur with  improper specimen collection/handling, submission of specimen other than nasopharyngeal swab, presence of viral mutation(s) within the areas targeted by this assay, and inadequate number of viral copies(<138 copies/mL). A negative result must be combined with clinical observations, patient history, and epidemiological information. The expected result is Negative.  Fact Sheet for Patients:  EntrepreneurPulse.com.au  Fact Sheet for Healthcare Providers:  IncredibleEmployment.be  This test is no t yet approved or cleared by the [place] FDA and  has been authorized for detection and/or diagnosis of SARS-CoV-2 by FDA under an Emergency Use Authorization (EUA). This EUA will remain  in effect (meaning this test can be used) for the duration of the COVID-19 declaration under Section 564(b)(1) of the  Act, 21 U.S.C.section 360bbb-3(b)(1), unless the authorization is terminated  or revoked sooner.       Influenza A by PCR NEGATIVE NEGATIVE Final   Influenza B by PCR NEGATIVE NEGATIVE Final    Comment: (NOTE) The Xpert Xpress SARS-CoV-2/FLU/RSV plus assay is intended as an aid in the diagnosis of influenza from Nasopharyngeal swab specimens and should not be used as a sole basis for treatment. Nasal washings and aspirates are unacceptable for Xpert Xpress SARS-CoV-2/FLU/RSV testing.  Fact Sheet for Patients: EntrepreneurPulse.com.au  Fact Sheet for Healthcare Providers: IncredibleEmployment.be  This test is not yet approved or cleared by the [place] FDA and has been authorized for detection and/or diagnosis of SARS-CoV-2 by FDA under an Emergency Use Authorization (EUA). This EUA will remain in effect (meaning  this test can be used) for the duration of the COVID-19 declaration under Section 564(b)(1) of the Act, 21 U.S.C. section 360bbb-3(b)(1), unless the authorization is terminated or revoked.  Performed at [facility], [place] [address] [place], [place] [address]      Discharge Instructions:   Discharge Instructions     Diet - low sodium heart healthy   Complete by: As directed    Diet Carb Modified   Complete by: As directed    Increase activity slowly   Complete by: As directed       Allergies as of 10/24/2020       Reactions   Penicillins Anxiety, Other (See Comments)   DID THE REACTION INVOLVE: Swelling of the face/tongue/throat, SOB, or low BP? No Sudden or severe rash/hives, skin peeling, or the inside of the mouth or nose? No Did it require medical treatment? No When did it last happen?      many years ago If all above answers are "NO", may proceed with cephalosporin use.        Medication List     STOP taking these medications    folic acid 1 MG tablet Commonly known as: FOLVITE   glimepiride 4 MG tablet Commonly known as: AMARYL   multivitamin with minerals Tabs tablet   thiamine 100 MG tablet       TAKE these medications    amLODipine 10 MG tablet Commonly known as: NORVASC Take 10 mg by mouth daily.   aspirin 81 MG EC tablet Take 1 tablet (81 mg total) by mouth daily. Swallow whole.   atorvastatin 40 MG tablet Commonly known as: LIPITOR Take 1 tablet (40 mg total) by mouth daily.   carvedilol 25 MG tablet Commonly known as: COREG Take 1 tablet (25 mg total) by mouth daily.   Ensure Plus Liqd Take 237 mLs by mouth 2 (two) times daily between meals.   iron polysaccharides 150 MG capsule Commonly known as: Nu-Iron Take 1 capsule (150 mg total) by mouth daily.   levETIRAcetam 500 MG tablet Commonly known as: Keppra Take 1 tablet (500 mg total) by mouth 2 (two) times daily.   losartan 100 MG tablet Commonly known  as: COZAAR Take 100 mg by mouth daily.   metFORMIN 500 MG tablet Commonly known as: GLUCOPHAGE Take 500 mg by mouth 2 (two) times daily.   pantoprazole 40 MG tablet Commonly known as: PROTONIX Take 40 mg by mouth daily.          Time coordinating discharge: 28 minutes  Signed:  Anvitha Hutmacher  [facility] 10/24/2020, 8:26 AM   Pager on [email or web address]. If 7PM-7AM, please contact night-coverage at [email or web address]

## 2020-11-06 ENCOUNTER — Encounter: Payer: Self-pay | Admitting: Internal Medicine

## 2020-11-06 ENCOUNTER — Ambulatory Visit (INDEPENDENT_AMBULATORY_CARE_PROVIDER_SITE_OTHER): Payer: Medicare Other | Admitting: Internal Medicine

## 2020-11-06 ENCOUNTER — Other Ambulatory Visit: Payer: Self-pay

## 2020-11-06 VITALS — BP 111/62 | HR 76 | Temp 98.2°F | Resp 28 | Ht 71.0 in | Wt 171.7 lb

## 2020-11-06 DIAGNOSIS — Z720 Tobacco use: Secondary | ICD-10-CM

## 2020-11-06 DIAGNOSIS — Z0000 Encounter for general adult medical examination without abnormal findings: Secondary | ICD-10-CM

## 2020-11-06 DIAGNOSIS — Z23 Encounter for immunization: Secondary | ICD-10-CM

## 2020-11-06 DIAGNOSIS — K219 Gastro-esophageal reflux disease without esophagitis: Secondary | ICD-10-CM

## 2020-11-06 DIAGNOSIS — D509 Iron deficiency anemia, unspecified: Secondary | ICD-10-CM | POA: Diagnosis not present

## 2020-11-06 DIAGNOSIS — Z8673 Personal history of transient ischemic attack (TIA), and cerebral infarction without residual deficits: Secondary | ICD-10-CM

## 2020-11-06 DIAGNOSIS — E1169 Type 2 diabetes mellitus with other specified complication: Secondary | ICD-10-CM | POA: Diagnosis not present

## 2020-11-06 DIAGNOSIS — E119 Type 2 diabetes mellitus without complications: Secondary | ICD-10-CM

## 2020-11-06 DIAGNOSIS — F1090 Alcohol use, unspecified, uncomplicated: Secondary | ICD-10-CM

## 2020-11-06 DIAGNOSIS — E1159 Type 2 diabetes mellitus with other circulatory complications: Secondary | ICD-10-CM

## 2020-11-06 DIAGNOSIS — Z789 Other specified health status: Secondary | ICD-10-CM

## 2020-11-06 DIAGNOSIS — E785 Hyperlipidemia, unspecified: Secondary | ICD-10-CM | POA: Diagnosis not present

## 2020-11-06 DIAGNOSIS — E11319 Type 2 diabetes mellitus with unspecified diabetic retinopathy without macular edema: Secondary | ICD-10-CM

## 2020-11-06 DIAGNOSIS — R55 Syncope and collapse: Secondary | ICD-10-CM

## 2020-11-06 DIAGNOSIS — I152 Hypertension secondary to endocrine disorders: Secondary | ICD-10-CM

## 2020-11-06 DIAGNOSIS — R569 Unspecified convulsions: Secondary | ICD-10-CM

## 2020-11-06 DIAGNOSIS — Z7289 Other problems related to lifestyle: Secondary | ICD-10-CM

## 2020-11-06 DIAGNOSIS — Z1159 Encounter for screening for other viral diseases: Secondary | ICD-10-CM

## 2020-11-06 MED ORDER — NICOTINE 7 MG/24HR TD PT24: 7.0000 mg | MEDICATED_PATCH | TRANSDERMAL | 0 refills | Status: AC

## 2020-11-06 MED ORDER — AMLODIPINE BESYLATE 10 MG PO TABS: 10.0000 mg | ORAL_TABLET | Freq: Every day | ORAL | 3 refills | Status: DC

## 2020-11-06 MED ORDER — PANTOPRAZOLE SODIUM 40 MG PO TBEC: 40.0000 mg | DELAYED_RELEASE_TABLET | Freq: Every day | ORAL | 3 refills | Status: DC

## 2020-11-06 MED ORDER — NICOTINE 14 MG/24HR TD PT24: 14.0000 mg | MEDICATED_PATCH | TRANSDERMAL | 0 refills | Status: AC

## 2020-11-06 MED ORDER — ZOSTER VAC RECOMB ADJUVANTED 50 MCG/0.5ML IM SUSR: 0.5000 mL | Freq: Once | INTRAMUSCULAR | 0 refills | Status: AC

## 2020-11-06 NOTE — Patient Instructions (Addendum)
Thank you for visiting the Internal Medicine Clinic today. It was a pleasure to meet you! Today we discussed annual blood work, medications, and follow-up visits and referrals to help you recover from your stroke.  I have ordered the following for you:  Lab orders: CBC Iron panel Hepatitis C screening  Vaccines given today: Pneumonia Shingles vaccine prescription sent to your pharmacy. You will get this there.  Tests ordered: None  Referrals: Physical therapy Speech therapy Ophthalmology Neurology: September 6  Medication changes: Nicotine patches: You will have two prescriptions for this to treat you for 8 weeks. For the first 6 weeks, you are to apply one 14 mg nicotine patch daily. After 6 weeks, you will apply one 7 mg nicotine patch daily.   Follow-up: 3 months to check your blood pressure, diabetes control.  Remember: If you have any questions or concerns, please call our clinic at [phone] between 9am-5pm and after hours call [phone] and ask for the internal medicine resident on call. If you feel you are having a medical emergency please call 911.  Farrel Gordon, DO

## 2020-11-06 NOTE — Assessment & Plan Note (Signed)
Patient denies recurrence of seizure activity. - Continue Keppra 500 mg twice daily - Neurology follow-up December 23, 2020

## 2020-11-06 NOTE — Assessment & Plan Note (Addendum)
BP today 111/62. Patient does check his BP at home and states that it doesn't get high. - Continue amlodipine 10 mg daily, carvedilol 25 mg daily, losartan 100 mg daily. - Check BP readings while relaxed at home and keep a log to review at next visit.

## 2020-11-06 NOTE — Assessment & Plan Note (Signed)
Patient is overdue for annual eye exam and does not have an established ophthalmologist. - Referral for ophthalmology placed

## 2020-11-06 NOTE — Assessment & Plan Note (Signed)
Patient endorses decreased alcohol consumption.

## 2020-11-06 NOTE — Assessment & Plan Note (Addendum)
Recommended screenings completed today: - Hepatitis C  Recommended vaccines given today: - Prevnar 20 - Shingrix prescription sent to pharmacy  Addendum 11/07/2020: Hepatitis C antibody resulted positive. Will add on order for Hepatitis C RNA quant and place order for ultrasound his liver.

## 2020-11-06 NOTE — Assessment & Plan Note (Signed)
Patient endorses continued difficulty with speech, some drooling, and balance concerns since his stroke. - Continue medication management for HTN and HLD. - Continue aspirin 81 mg daily. - Neurology follow-up December 23, 2020 - Referral for speech therapy - Referral for physical therapy

## 2020-11-06 NOTE — Assessment & Plan Note (Addendum)
Patient currently denies any bloody stools or worsened dizziness or fatigue from baseline. Given history of GI bleed and recent anemia requiring transfusion, check CBC and iron studies today. Will consider restarting iron polysaccharides 150 mg pending these results.  Addendum 11/07/2020: Hemoglobin resulted 8.5 which is down from 11.3 October 23, 2020. Will refer back to GI given history of GI bleeding. Iron panel resulted all WNL.

## 2020-11-06 NOTE — Assessment & Plan Note (Addendum)
Patient denies any episodes of symptomatic hypoglycemia since discontinuing glimepiride. Home sugars range in the 140s post-prandial. - Continue metformin 500 mg once daily - Check blood sugar levels 3 times weekly, fasting, and keep a log to review at next visit. - Foot exam completed today

## 2020-11-06 NOTE — Assessment & Plan Note (Signed)
Patient denies GI upset or myalgias with atorvastatin therapy. - Lipid panel today - Continue atorvastatin 40 mg once daily

## 2020-11-06 NOTE — Progress Notes (Signed)
Established Patient Office Visit  Subjective:  Patient ID: Gregory Nunez., male    DOB: 09/18/1945  Age: 75 y.o. MRN: [id number]  CC:  Chief Complaint  Patient presents with   Establish Care   Follow-up    Stroke 3 months ago.  Had referral to Neurologist     HPI [name]. presents for establishing care and assistance with follow-up recommendations after a stroke.  He endorses a stroke 3 months ago and was recommended to follow-up with neurology however has had difficulty setting up an appointment. He has also been hospitalized recently where he was diagnosed with syncopal seizure, at which point he was started on Keppra 500 mg twice daily. He endorses trouble with speech, some drooling, and worsened balance. He denies trouble swallowing. He often has trouble with communicating his thoughts and word-finding. He would like a referral for physical therapy and speech therapy.  He was also found to have symptomatic anemia with a hemoglobin of 6.7 in June 2022 for which he was admitted and transfused with 2 units of PRBC. FOBT was positive at this time and subsequent endoscopy was unremarkable. He was discharged home in stable condition with correction of his hemoglobin. He denies any bloody stools at this time. He has been treated with iron polysaccharides 150 mg in the past.  He has a history of hypoglycemia requiring medical intervention, most recently in July 2022. At this time he was discontinued from glimepiride and denies recurrence of symptomatic hypoglycemia since. He reports that home post-prandial blood sugar readings are around 140. He continues to take metformin 500 mg once daily. He has not had an annual eye exam in some time and is not established with ophthalmology at this time.  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   Patient is approximately 5-week status post bunion correction left foot   Hyperlipidemia    Hypertension    Hypertensive retinopathy    OU   Malignant neoplasm of prostate ([place]) 01/09/2014   Pancreatitis    Prostate cancer ([place]) 12/19/2013   Gleason 4+3=7, volume 31.31 cc   Shortness of breath dyspnea    with exertion     Past Surgical History:  Procedure Laterality Date   BIOPSY  04/16/2018   Procedure: BIOPSY;  Surgeon: Juanita Craver, MD;  Location: [facility];  Service: Endoscopy;;   BIOPSY  05/23/2020   Procedure: BIOPSY;  Surgeon: Jackquline Denmark, MD;  Location: [facility];  Service: Gastroenterology;;  EGD and COLON   biopsy on throat     hx of    CATARACT EXTRACTION Bilateral    Dr. Quentin Ore   COLONOSCOPY N/A 05/23/2020   Procedure: COLONOSCOPY;  Surgeon: Jackquline Denmark, MD;  Location: [facility];  Service: Gastroenterology;  Laterality: N/A;   ENTEROSCOPY N/A 10/07/2020   Procedure: ENTEROSCOPY;  Surgeon: [name], MD;  Location: [facility] ENDOSCOPY;  Service: Endoscopy;  Laterality: N/A;   ESOPHAGOGASTRODUODENOSCOPY Left 04/16/2018   Procedure: ESOPHAGOGASTRODUODENOSCOPY (EGD);  Surgeon: Juanita Craver, MD;  Location: [facility];  Service: Endoscopy;  Laterality: Left;   ESOPHAGOGASTRODUODENOSCOPY (EGD) WITH PROPOFOL N/A 05/23/2020   Procedure: ESOPHAGOGASTRODUODENOSCOPY (EGD) WITH PROPOFOL;  Surgeon: Jackquline Denmark, MD;  Location: [facility];  Service:  Gastroenterology;  Laterality: N/A;   EYE SURGERY     FOOT SURGERY     HOT HEMOSTASIS N/A 04/16/2018   Procedure: HOT HEMOSTASIS (ARGON PLASMA COAGULATION/BICAP);  Surgeon: Juanita Craver, MD;  Location: [facility];  Service: Endoscopy;  Laterality: N/A;   HOT HEMOSTASIS N/A 05/23/2020   Procedure: HOT HEMOSTASIS (ARGON PLASMA COAGULATION/BICAP);  Surgeon: Jackquline Denmark, MD;  Location: [facility];  Service:  Gastroenterology;  Laterality: N/A;   LYMPHADENECTOMY Bilateral 02/27/2014   Procedure: BILATERAL LYMPHADENECTOMY;  Surgeon: Alexis Frock, MD;  Location: [facility];  Service: Urology;  Laterality: Bilateral;   POLYPECTOMY  05/23/2020   Procedure: POLYPECTOMY;  Surgeon: Jackquline Denmark, MD;  Location: [facility];  Service: Gastroenterology;;   PROSTATE BIOPSY  12/2013   Gleason 4+3=7, volume 31.31 cc   ROBOT ASSISTED LAPAROSCOPIC RADICAL PROSTATECTOMY N/A 02/27/2014   Procedure: ROBOTIC ASSISTED LAPAROSCOPIC RADICAL PROSTATECTOMY WITH INDOCYANINE GREEN DYE;  Surgeon: Alexis Frock, MD;  Location: [facility];  Service: Urology;  Laterality: N/A;    Family History  Problem Relation Age of Onset   Heart disease Mother    Cancer Sister        breast   Colon cancer Neg Hx    Esophageal cancer Neg Hx    Rectal cancer Neg Hx    Stomach cancer Neg Hx     Social History   Socioeconomic History   Marital status: Married    Spouse name: Not on file   Number of children: Not on file   Years of education: Not on file   Highest education level: Not on file  Occupational History   Not on file  Tobacco Use   Smoking status: Every Day    Packs/day: 1.00    Types: Cigarettes   Smokeless tobacco: Never   Tobacco comments:    1 ppd Wants Patches   Vaping Use   Vaping Use: Never used  Substance and Sexual Activity   Alcohol use: Not Currently    Alcohol/week: 1.0 standard drink    Types: 1 Cans of beer per week   Drug use: Not Currently    Types: Marijuana, Cocaine    Comment: past hx approx 30 years ago    Sexual activity: Not on file  Other Topics Concern   Not on file  Social History Narrative   ** Merged History Encounter **       Social Determinants of Health   Financial Resource Strain: Not on file  Food Insecurity: Not on file  Transportation Needs: Not on file  Physical Activity: Not on file  Stress: Not on file  Social Connections: Not on file  Intimate Partner Violence:  Not on file    Outpatient Medications Prior to Visit  Medication Sig Dispense Refill   aspirin EC 81 MG EC tablet Take 1 tablet (81 mg total) by mouth daily. Swallow whole.     atorvastatin (LIPITOR) 40 MG tablet Take 1 tablet (40 mg total) by mouth daily. 90 tablet 0   carvedilol (COREG) 25 MG tablet Take 1 tablet (25 mg total) by mouth daily.     Ensure Plus (ENSURE PLUS) LIQD Take 237 mLs by mouth 2 (two) times daily between meals.     iron polysaccharides (NU-IRON) 150 MG capsule Take 1 capsule (150 mg total) by mouth daily. 30 capsule 0   levETIRAcetam (KEPPRA) 500 MG tablet Take 1 tablet (500 mg total) by mouth 2 (two) times daily. 60 tablet 3   losartan (COZAAR) 100 MG tablet Take  100 mg by mouth daily.     metFORMIN (GLUCOPHAGE) 500 MG tablet Take 500 mg by mouth 2 (two) times daily.     amLODipine (NORVASC) 10 MG tablet Take 10 mg by mouth daily.     pantoprazole (PROTONIX) 40 MG tablet Take 40 mg by mouth daily.     No facility-administered medications prior to visit.    Allergies  Allergen Reactions   Penicillins Anxiety and Other (See Comments)    DID THE REACTION INVOLVE: Swelling of the face/tongue/throat, SOB, or low BP? No Sudden or severe rash/hives, skin peeling, or the inside of the mouth or nose? No Did it require medical treatment? No When did it last happen?      many years ago If all above answers are "NO", may proceed with cephalosporin use.       ROS Review of Systems  Constitutional:  Negative for activity change.  Respiratory:  Positive for cough and shortness of breath. Negative for wheezing.   Cardiovascular:  Negative for chest pain.  Gastrointestinal:  Negative for abdominal pain, blood in stool, constipation and diarrhea.  Endocrine: Negative for polyuria.  Genitourinary:  Negative for frequency and urgency.  Musculoskeletal:  Negative for myalgias.  Neurological:  Positive for dizziness, weakness and light-headedness. Negative for seizures and  syncope.  Psychiatric/Behavioral:  Positive for confusion (trouble communicating since stroke). The patient is not nervous/anxious.       Objective:    Physical Exam Vitals and nursing note reviewed.  Constitutional:      Appearance: Normal appearance.  HENT:     Mouth/Throat:     Lips: Pink.     Mouth: Mucous membranes are moist.     Tongue: No lesions. Tongue does not deviate from midline.     Pharynx: Uvula midline.  Eyes:     General: Lids are normal. Vision grossly intact. Gaze aligned appropriately.     Extraocular Movements: Extraocular movements intact.     Conjunctiva/sclera: Conjunctivae normal.     Pupils: Pupils are equal, round, and reactive to light.  Cardiovascular:     Rate and Rhythm: Normal rate and regular rhythm.     Heart sounds: Normal heart sounds.  Pulmonary:     Effort: Pulmonary effort is normal.     Breath sounds: Normal breath sounds and air entry.  Abdominal:     General: Abdomen is flat. Bowel sounds are normal. There is no distension.     Palpations: Abdomen is soft.     Tenderness: There is no abdominal tenderness.  Musculoskeletal:     Right lower leg: No edema.     Left lower leg: No edema.       Feet:  Feet:     Right foot:     Protective Sensation: 9 sites tested.  2 sites sensed.     Skin integrity: Dry skin present.     Toenail Condition: Right toenails are normal.     Left foot:     Protective Sensation: 9 sites tested.  0 sites sensed.     Skin integrity: Callus and dry skin present.     Toenail Condition: Left toenails are normal.  Skin:    General: Skin is warm and dry.  Neurological:     General: No focal deficit present.     Mental Status: He is alert and oriented to person, place, and time.     Cranial Nerves: Cranial nerves are intact. No facial asymmetry.     Sensory: Sensation is  intact.     Motor: Motor function is intact. No weakness, abnormal muscle tone or seizure activity.     Coordination: Finger-Nose-Finger  Test normal.  Psychiatric:        Mood and Affect: Mood and affect normal.        Behavior: Behavior is cooperative.        Cognition and Memory: Cognition normal.    BP 111/62 (BP Location: Left Arm, Patient Position: Sitting, Cuff Size: Normal)   Pulse 76   Temp 98.2 F (36.8 C) (Oral)   Resp (!) 28   Ht 5\' 11"  (1.803 m)   Wt 171 lb 11.2 oz (77.9 kg)   SpO2 100% Comment: room air  BMI 23.95 kg/m  Wt Readings from Last 3 Encounters:  11/06/20 171 lb 11.2 oz (77.9 kg)  10/22/20 182 lb (82.6 kg)  10/07/20 169 lb 12.1 oz (77 kg)     Health Maintenance Due  Topic Date Due   FOOT EXAM  Never done   Hepatitis C Screening  Never done   Zoster Vaccines- Shingrix (1 of 2) Never done   PNA vac Low Risk Adult (1 of 2 - PCV13) 11/01/2012   OPHTHALMOLOGY EXAM  07/28/2019   COVID-19 Vaccine (3 - Pfizer risk series) 08/14/2019    There are no preventive care reminders to display for this patient.  Lab Results  Component Value Date   TSH 1.839 10/06/2020   Lab Results  Component Value Date   WBC 4.6 10/23/2020   HGB 11.3 (L) 10/23/2020   HCT 35.2 (L) 10/23/2020   MCV 91.0 10/23/2020   PLT 178 10/23/2020   Lab Results  Component Value Date   NA 142 10/24/2020   K 3.8 10/24/2020   CO2 24 10/24/2020   GLUCOSE 94 10/24/2020   BUN 22 10/24/2020   CREATININE 1.64 (H) 10/24/2020   BILITOT 0.6 10/22/2020   ALKPHOS 57 10/22/2020   AST 38 10/22/2020   ALT 28 10/22/2020   PROT 7.8 10/22/2020   ALBUMIN 3.1 (L) 10/22/2020   CALCIUM 8.7 (L) 10/24/2020   ANIONGAP 6 10/24/2020   Lab Results  Component Value Date   CHOL 136 08/28/2020   Lab Results  Component Value Date   HDL 58 08/28/2020   Lab Results  Component Value Date   LDLCALC 61 08/28/2020   Lab Results  Component Value Date   TRIG 90 08/28/2020   Lab Results  Component Value Date   CHOLHDL 2.3 08/28/2020   Lab Results  Component Value Date   HGBA1C 6.2 (H) 10/22/2020      Assessment & Plan:    Problem List Items Addressed This Visit       Cardiovascular and Mediastinum   Hypertension associated with diabetes ([place])    BP today 111/62. Patient does check his BP at home and states that it doesn't get high. - Continue amlodipine 10 mg daily, carvedilol 25 mg daily, losartan 100 mg daily. - Check BP readings while relaxed at home and keep a log to review at next visit.       Relevant Medications   amLODipine (NORVASC) 10 MG tablet     Endocrine   Type 2 diabetes mellitus without complication ([place])    Patient denies any episodes of symptomatic hypoglycemia since discontinuing glimepiride. Home sugars range in the 140s post-prandial. - Continue metformin 500 mg once daily - Check blood sugar levels 3 times weekly, fasting, and keep a log to review at next visit. - Foot  exam completed today       Hyperlipidemia associated with type 2 diabetes mellitus ([place]) - Primary    Patient denies GI upset or myalgias with atorvastatin therapy. - Lipid panel today - Continue atorvastatin 40 mg once daily       Relevant Orders   Lipid Profile   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 ([place])    Patient is overdue for annual eye exam and does not have an established ophthalmologist. - Referral for ophthalmology placed       Relevant Orders   Ambulatory referral to Ophthalmology     Other   Syncopal seizure [facility])    Patient denies recurrence of seizure activity. - Continue Keppra 500 mg twice daily - Neurology follow-up December 23, 2020       History of CVA (cerebrovascular accident)    Patient endorses continued difficulty with speech, some drooling, and balance concerns since his stroke. - Continue medication management for HTN and HLD. - Continue aspirin 81 mg daily. - Neurology follow-up December 23, 2020 - Referral for speech therapy - Referral for physical therapy       Relevant Orders   Ambulatory referral to Physical Therapy   Ambulatory  referral to Speech Therapy   Alcohol use    Patient endorses decreased alcohol consumption.        Tobacco use    Nicotine patches sent in. Patient instructed to wear 14 mg patch daily for 6 weeks, then wear 7 mg patch daily for 2 weeks, then stop wearing patches.       Iron deficiency anemia    Patient currently denies any bloody stools or worsened dizziness or fatigue from baseline. Given history of GI bleed and recent anemia requiring transfusion, check CBC and iron studies today. Will consider restarting iron polysaccharides 150 mg pending these results.       Relevant Orders   Iron and IBC ([id number])   CBC no Diff   Healthcare maintenance    Recommended screenings completed today: - Hepatitis C  Recommended vaccines given today: - Prevnar 20 - Shingrix prescription sent to pharmacy       Relevant Orders   Hepatitis C antibody   Pneumococcal conjugate vaccine 20-valent (Prevnar 20) (Completed)    Meds ordered this encounter  Medications   nicotine (NICODERM CQ - DOSED IN MG/24 HOURS) 14 mg/24hr patch    Sig: Place 1 patch (14 mg total) onto the skin daily.    Dispense:  42 patch    Refill:  0   nicotine (NICODERM CQ - DOSED IN MG/24 HR) 7 mg/24hr patch    Sig: Place 1 patch (7 mg total) onto the skin daily for 14 days.    Dispense:  14 patch    Refill:  0   Zoster Vaccine Adjuvanted [facility]) injection    Sig: Inject 0.5 mLs into the muscle once for 1 dose.    Dispense:  0.5 mL    Refill:  0   amLODipine (NORVASC) 10 MG tablet    Sig: Take 1 tablet (10 mg total) by mouth daily.    Dispense:  30 tablet    Refill:  3   pantoprazole (PROTONIX) 40 MG tablet    Sig: Take 1 tablet (40 mg total) by mouth daily.    Dispense:  30 tablet    Refill:  3    Follow-up: Return in about 3 months (around 02/06/2021).    Farrel Gordon, DO

## 2020-11-06 NOTE — Assessment & Plan Note (Signed)
Nicotine patches sent in. Patient instructed to wear 14 mg patch daily for 6 weeks, then wear 7 mg patch daily for 2 weeks, then stop wearing patches.

## 2020-11-07 ENCOUNTER — Telehealth (INDEPENDENT_AMBULATORY_CARE_PROVIDER_SITE_OTHER): Payer: Medicare Other | Admitting: Internal Medicine

## 2020-11-07 DIAGNOSIS — R768 Other specified abnormal immunological findings in serum: Secondary | ICD-10-CM

## 2020-11-07 DIAGNOSIS — K922 Gastrointestinal hemorrhage, unspecified: Secondary | ICD-10-CM

## 2020-11-07 LAB — LIPID PANEL
Chol/HDL Ratio: 3 ratio (ref 0.0–5.0)
Cholesterol, Total: 124 mg/dL (ref 100–199)
HDL: 42 mg/dL (ref >39)
LDL Chol Calc (NIH): 57 mg/dL (ref 0–99)
Triglycerides: 142 mg/dL (ref 0–149)
VLDL Cholesterol Cal: 25 mg/dL (ref 5–40)

## 2020-11-07 LAB — CBC
Hematocrit: 25.7 % — ABNORMAL LOW (ref 37.5–51.0)
Hemoglobin: 8.5 g/dL — ABNORMAL LOW (ref 13.0–17.7)
MCH: 30.1 pg (ref 26.6–33.0)
MCHC: 33.1 g/dL (ref 31.5–35.7)
MCV: 91 fL (ref 79–97)
Platelets: 151 10*3/uL (ref 150–450)
RBC: 2.82 x10E6/uL — ABNORMAL LOW (ref 4.14–5.80)
RDW: 15.1 % (ref 11.6–15.4)
WBC: 4.7 10*3/uL (ref 3.4–10.8)

## 2020-11-07 LAB — IRON AND TIBC
Iron Saturation: 20 % (ref 15–55)
Iron: 64 ug/dL (ref 38–169)
Total Iron Binding Capacity: 328 ug/dL (ref 250–450)
UIBC: 264 ug/dL (ref 111–343)

## 2020-11-07 LAB — HEPATITIS C ANTIBODY: Hep C Virus Ab: >11 s/co ratio — ABNORMAL HIGH (ref 0.0–0.9)

## 2020-11-07 NOTE — Telephone Encounter (Signed)
Spoke with patient's niece, [name], who was present at his appointment yesterday. I informed her that the hepatitis C antibody screen was positive and that I have added an order for hepatitis C RNA testing to help see if this is a new infection or one that his body has had for some time now. I also informed her that his hemoglobin has dropped to 8.5, from 11.3 earlier this month, and with his history of GI bleeding I am going to refer him back to GI to have him evaluated and made sure that he is not actively having a GI bleed. I also let her know that I would be ordering an abdominal ultrasound to take a look at his liver given the positive hepatitis C antibody result. She voiced understanding.

## 2020-11-07 NOTE — Addendum Note (Signed)
Addended by: Renato Battles on: 11/07/2020 09:56 AM   Modules accepted: Orders

## 2020-11-10 DIAGNOSIS — Z1159 Encounter for screening for other viral diseases: Secondary | ICD-10-CM | POA: Insufficient documentation

## 2020-11-10 DIAGNOSIS — Z23 Encounter for immunization: Secondary | ICD-10-CM

## 2020-11-10 HISTORY — DX: Encounter for immunization: Z23

## 2020-11-10 NOTE — Addendum Note (Signed)
Addended by: Renato Battles on: 11/10/2020 02:27 PM   Modules accepted: Orders

## 2020-11-10 NOTE — Assessment & Plan Note (Signed)
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 I have included this in the GI referral for evaluation and treatment.

## 2020-11-10 NOTE — Progress Notes (Signed)
Internal Medicine Clinic Attending  I saw and evaluated the patient.  I personally confirmed the key portions of the history and exam documented by Dr.  Dean  and I reviewed pertinent patient test results.  The assessment, diagnosis, and plan were formulated together and I agree with the documentation in the resident's note.  

## 2020-11-11 LAB — HCV RNA QUANT
HCV log10: 4.607 log10 IU/mL
Hepatitis C Quantitation: 40500 IU/mL

## 2020-11-11 LAB — SPECIMEN STATUS REPORT

## 2020-11-17 ENCOUNTER — Encounter: Payer: Self-pay | Admitting: Speech Pathology

## 2020-11-17 ENCOUNTER — Other Ambulatory Visit: Payer: Self-pay

## 2020-11-17 ENCOUNTER — Ambulatory Visit: Payer: Medicare Other | Attending: Internal Medicine | Admitting: Speech Pathology

## 2020-11-17 DIAGNOSIS — R278 Other lack of coordination: Secondary | ICD-10-CM | POA: Insufficient documentation

## 2020-11-17 DIAGNOSIS — R41841 Cognitive communication deficit: Secondary | ICD-10-CM | POA: Insufficient documentation

## 2020-11-17 DIAGNOSIS — M6281 Muscle weakness (generalized): Secondary | ICD-10-CM | POA: Insufficient documentation

## 2020-11-17 DIAGNOSIS — R29818 Other symptoms and signs involving the nervous system: Secondary | ICD-10-CM | POA: Insufficient documentation

## 2020-11-17 DIAGNOSIS — Z9181 History of falling: Secondary | ICD-10-CM | POA: Insufficient documentation

## 2020-11-17 DIAGNOSIS — R2681 Unsteadiness on feet: Secondary | ICD-10-CM | POA: Diagnosis present

## 2020-11-17 DIAGNOSIS — I69318 Other symptoms and signs involving cognitive functions following cerebral infarction: Secondary | ICD-10-CM | POA: Insufficient documentation

## 2020-11-17 DIAGNOSIS — R4701 Aphasia: Secondary | ICD-10-CM | POA: Diagnosis not present

## 2020-11-17 NOTE — Patient Instructions (Signed)
  Aphasia affects all of language (understanding, reading, talking and writing)    Checkers Chess Connect [place] saw puzzles Easy cross words Memory match Board games [place] a new game!  Name words in a category Listen to and discuss Ted Talks or Podcasts Read and discuss short articles of interest to you- Take notes on these if memory is a challenge Discuss social media posts Look and discuss photo albums  The best activities to improve cognition are functional, real life activities that are important to you:  Plan a menu Participate in household chores and decisions (with supervision) Participate in [place] a party, trip or tailgate with all of the details (even if you aren't really going to carry it out) Participate in your hobby as you are able with assistance Manage your texts, emails with supervision if needed. Google search for items (even if you're not really going to buy anything) and compare prices and features Socialize -  however, too many visitors can be overwhelming, so set limits "My doctor said I should only visit (or talk) for 20 minutes" or "I do better when I visit with just 1-2 people at a time for 20 minutes"    It's good to use real in-person games, not just apps  Apps:  [employer] (free on a tablet)

## 2020-11-17 NOTE — Therapy (Signed)
[facility] [address] [place], [place], [address] Phone: [phone]   Fax:  [phone]  Speech Language Pathology Evaluation  Patient Details  Name: Gregory Nunez. MRN: [id number] Date of Birth: 1945/07/13 Referring Provider (SLP): Dr. Velna Ochs   Encounter Date: 11/17/2020   End of Session - 11/17/20 1342     Visit Number 1    Number of Visits 25    Date for SLP Re-Evaluation 02/09/21    SLP Start Time 0931    SLP Stop Time  [id number]    SLP Time Calculation (min) 43 min    Activity Tolerance Patient tolerated treatment well             Past Medical History:  Diagnosis Date   Cataract    OD   Depression    Diabetes mellitus without complication ([place])    Diabetic retinopathy ([place])    NPDR OU   GERD (gastroesophageal reflux disease)    if drinks alcohol   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   Patient is approximately 5-week status post bunion correction left foot   Hyperlipidemia    Hypertension    Hypertensive retinopathy    OU   Malignant neoplasm of prostate ([place]) 01/09/2014   Pancreatitis    Prostate cancer ([place]) 12/19/2013   Gleason 4+3=7, volume 31.31 cc   Shortness of breath dyspnea    with exertion     Past Surgical History:  Procedure Laterality Date   BIOPSY  04/16/2018   Procedure: BIOPSY;  Surgeon: Juanita Craver, MD;  Location: [facility];  Service: Endoscopy;;   BIOPSY  05/23/2020   Procedure: BIOPSY;  Surgeon: Jackquline Denmark, MD;  Location: [facility];  Service: Gastroenterology;;  EGD and COLON   biopsy on throat     hx of    CATARACT EXTRACTION Bilateral    Dr. Quentin Ore   COLONOSCOPY N/A 05/23/2020   Procedure: COLONOSCOPY;  Surgeon: Jackquline Denmark, MD;  Location: [facility];  Service: Gastroenterology;  Laterality: N/A;   ENTEROSCOPY N/A 10/07/2020   Procedure: ENTEROSCOPY;  Surgeon: [name], MD;  Location: [facility]COPY;  Service: Endoscopy;   Laterality: N/A;   ESOPHAGOGASTRODUODENOSCOPY Left 04/16/2018   Procedure: ESOPHAGOGASTRODUODENOSCOPY (EGD);  Surgeon: Juanita Craver, MD;  Location: [facility];  Service: Endoscopy;  Laterality: Left;   ESOPHAGOGASTRODUODENOSCOPY (EGD) WITH PROPOFOL N/A 05/23/2020   Procedure: ESOPHAGOGASTRODUODENOSCOPY (EGD) WITH PROPOFOL;  Surgeon: Jackquline Denmark, MD;  Location: [facility];  Service: Gastroenterology;  Laterality: N/A;   EYE SURGERY     FOOT SURGERY     HOT HEMOSTASIS N/A 04/16/2018   Procedure: HOT HEMOSTASIS (ARGON PLASMA COAGULATION/BICAP);  Surgeon: Juanita Craver, MD;  Location: [facility];  Service: Endoscopy;  Laterality: N/A;   HOT HEMOSTASIS N/A 05/23/2020   Procedure: HOT HEMOSTASIS (ARGON PLASMA COAGULATION/BICAP);  Surgeon: Jackquline Denmark, MD;  Location: [facility];  Service: Gastroenterology;  Laterality: N/A;   LYMPHADENECTOMY Bilateral 02/27/2014   Procedure: BILATERAL LYMPHADENECTOMY;  Surgeon: Alexis Frock, MD;  Location: [facility];  Service: Urology;  Laterality: Bilateral;   POLYPECTOMY  05/23/2020   Procedure: POLYPECTOMY;  Surgeon: Jackquline Denmark, MD;  Location: [facility];  Service: Gastroenterology;;   PROSTATE BIOPSY  12/2013   Gleason 4+3=7, volume 31.31 cc   ROBOT ASSISTED LAPAROSCOPIC RADICAL PROSTATECTOMY N/A 02/27/2014   Procedure: ROBOTIC ASSISTED LAPAROSCOPIC RADICAL PROSTATECTOMY WITH INDOCYANINE GREEN DYE;  Surgeon: Alexis Frock, MD;  Location: [facility];  Service: Urology;  Laterality: N/A;    There were  no vitals filed for this visit.   Subjective Assessment - 11/17/20 1229     Subjective "Definitely speech/language" choice for eval of speech vs cognition    Patient is accompained by: Family member   niece Violet Baldy   Currently in Pain? No/denies                SLP Evaluation [facility] - 11/17/20 1229       SLP Visit Information   SLP Received On 11/17/20    Referring Provider (SLP) Dr. Velna Ochs    Onset Date 04/23/2020    Medical  Diagnosis Hx of CVA(s)      Subjective   Patient/Family Stated Goal "to return home and live independently"      General Information   HPI History of 2  strokes, Jan and Feb 2022.Recommended to follow-up with neurology however has had difficulty setting up an appointment. He has also been hospitalized recently where he was diagnosed with syncopal seizure, at which point he was started on Keppra 500 mg twice daily. He endorses trouble with speech, some drooling, and worsened balance. He denies trouble swallowing. He often has trouble with communicating his thoughts and word-finding. He would like a referral for physical therapy and speech therapy. 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  2. Additional 5 mm subacute ischemic nonhemorrhagic right periatrial  white matter infarct.  3. Underlying age-related cerebral atrophy with moderate chronic  microvascular ischemic disease, with superimposed remote lacunar  infarcts involving the left paramedian pons and posterior right  frontoparietal corona radiata.    Mobility Status walks independently      Balance Screen   Has the patient fallen in the past 6 months Yes    How many times? 3    Has the patient had a decrease in activity level because of a fear of falling?  No    Is the patient reluctant to leave their home because of a fear of falling?  No      Prior Functional Status   Cognitive/Linguistic Baseline Within functional limits    Type of Home House     Lives With Family    Available Support Family    Education 11th    Vocation Retired      Associate Professor   Overall Cognitive Status Impaired/Different from baseline    Area of Impairment Attention;Memory;Following commands;Safety/judgement;Awareness;Problem solving    Current Attention Level Sustained    Memory Decreased short-term memory    Following Commands Follows one step commands consistently;Follows  multi-step commands inconsistently    Safety/Judgement Decreased awareness of deficits;Decreased awareness of safety    Awareness Intellectual    Awareness Comments impaired    Problem Solving Slow processing;Decreased initiation    Executive Function Reasoning;Organizing;Decision Making;Self Monitoring;Self Correcting      Auditory Comprehension   Overall Auditory Comprehension Impaired    Yes/No Questions Impaired    Basic Immediate Environment Questions 75-100% accurate    Complex Questions 50-74% accurate    Commands Not tested    Conversation Simple    Interfering Components Attention;Processing speed    EffectiveTechniques Repetition;Slowed speech;Extra processing time;Pausing;Stressing words      Visual Recognition/Discrimination   Discrimination Not tested      Reading Comprehension   Reading Status Not tested      Expression   Primary Mode of Expression Verbal      Verbal Expression   Overall Verbal Expression Impaired  Initiation Impaired    Level of Generative/Spontaneous Verbalization Conversation    Repetition Impaired    Level of Impairment Sentence level    Naming Impairment    Responsive Not tested    Confrontation 50-74% accurate    Convergent Not tested    Divergent 25-49% accurate    Verbal Errors Semantic paraphasias;Phonemic paraphasias    Pragmatics No impairment    Interfering Components Attention    Effective Techniques Phonemic cues;Sentence completion      Written Expression   Dominant Hand Right    Written Expression Exceptions to [facility]    Dictation Ability Phrase    Overall Writen Expression extended time and verbal cues for address      Oral Motor/Sensory Function   Overall Oral Motor/Sensory Function Impaired    Labial ROM Within Functional Limits    Labial Symmetry Within Functional Limits    Labial Strength Within Functional Limits    Labial Sensation Reduced Right    Labial Coordination WFL    Lingual ROM Within Functional Limits     Velum Within Functional Limits    Mandible Within Functional Limits    Overall Oral Motor/Sensory Function mild drool right      Motor Speech   Overall Motor Speech Impaired    Respiration Within functional limits    Phonation Normal    Resonance Within functional limits    Motor Planning Impaired    Level of Impairment Sentence    Motor Speech Errors Groping for words;Unaware    Effective Techniques Slow rate;Pause      Standardized Assessments   Standardized Assessments  Other Assessment   Quick Aphasia Battery - 6.60 moderate aphasia                            SLP Education - 11/17/20 1341     Education Details aphasia ed, cognitive deficits, cognitive and languge activities to do at home, goals    Person(s) Educated Patient;Caregiver(s)   niece [name]   Methods Explanation;Demonstration;Verbal cues;Handout    Comprehension Verbalized understanding;Verbal cues required;Need further instruction              SLP Short Term Goals - 11/17/20 1356       SLP SHORT TERM GOAL #1   Title Pt will use external aids to recall am meds with 3 or less reminders from family over 1 week    Baseline family remidning his consistently every day    Time 4    Period Weeks    Status New      SLP SHORT TERM GOAL #2   Title Avrumi will carryover 2 compensations for memory and attention to complete 3 house hold chores a week with occasiona min A from family    Baseline not completing house hold chores    Time 4    Period Weeks    Status New      SLP SHORT TERM GOAL #3   Title 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 18/20 sentences with occasional min A    Time 4    Period Weeks    Status New      SLP SHORT TERM GOAL #4   Title Pt will name 10 items for personally relevant category using verbal compensations as needed with occasional min A over 2 sessions    Time 4    Period Weeks    Status New  SLP SHORT TERM GOAL #5    Title Pt will fill out 2 money orders to pay bills with occasional min A from family    Baseline Nephew is paying his bills for him    Time 4    Period Weeks    Status New              SLP Long Term Goals - 11/17/20 1403       SLP LONG TERM GOAL #1   Title Pt will use external aids and compensations to manage his medications, appointments and daily to do list with occasional min A from family    Baseline Family managing meds and appointments    Time 12    Period Weeks    Status New      SLP LONG TERM GOAL #2   Title 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 over 15 minute conversation to be 95% intelligible with occasional min A    Time 12    Period Weeks    Status New      SLP LONG TERM GOAL #3   Title Pt will ID aphasic errors in simple conversation with 3 or less verbal cues and attempt to self correct errors    Baseline Pt unaware of errors    Time 12    Period Weeks    Status New      SLP LONG TERM GOAL #4   Title Pt will use organization system, calendar to pay 3 bills on time with occasional min A from family.    Baseline family managing his finances    Time 58    Period Weeks    Status New      SLP LONG TERM GOAL #5   Title Family will rate [name]' communciation on The Communicative Participation Item Bank General Short Form a 12 or higher    Baseline rated a score of 9    Time 12    Period Weeks    Status New      Additional Long Term Goals   Additional Long Term Goals Yes      SLP LONG TERM GOAL #6   Title Pt will carryover 3 strategies for attention and memory to cook3  simple meals with supervision from family    Baseline not cooking    Time 12    Period Weeks    Status New              Plan - 11/17/20 1343     Clinical Impression Statement Jaylend Schierer is referred for outpt ST by PCP due to speech/language and cognitive impairments s/p CVA 04/23/2020. He is accompanied by his niece, [name]. According to [name], prior to CVA in  January, Jeannie was independent with finances, appointments, medications and was driving. Cognitive impairments noted by ST on acute care in January, however family reports only 2-3 visits by [place], no other ST received. Since CVA, Regginald has moved in with his mother and extnded family are helping with his apppointment, cooking, cleaning, financial and medication management. Burley has not cooked or done household chores since CVA. He reports he mostly watches TV and "relaxes." His goal is to return to his home and live independently. Their primary concern today is speech/language. The Quick Aphasia Battery revealed moderate aphasia. 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 Apraxic errors noted with multisyllabic words. Semantic paraphasias noted without his awareness. In 3 minute conversation, Farrell  perseverated, halted or used repetitive fillers (uh, uh, uh) 7 times. He named 6 animals in 1 minute and 3 "m" words in 1 minute (15 to 20 is WNL). The Communicative Participation Item Bank - General Short Form was administered to [facility] as well as [name]. Ashante rated he has "a little" difficulty in most communication tasks (score of 22) while [name] rated Brittney having "quite a bit" of difficulty on most communication interactions (score of 9) demonstrating [place] has poor awareness of speech and language impairments. Written expression with aphasic errors on his address and simple phrase dictation. Prior to CVA, he filled out money orders to pay his bills, how his nephew is doing this for him. I recommend skilled ST to maximize cognition and communication for safety, to reduce caregiver burden, and improve independence for possible return to his home with modified independence.    Speech Therapy Frequency 2x / week    Duration 12 weeks    Treatment/Interventions Cueing hierarchy;Environmental controls;Language facilitation;SLP instruction and  feedback;Compensatory strategies;Functional tasks;Cognitive reorganization;Compensatory techniques;Internal/external aids;Multimodal communcation approach;Patient/family education    Potential to Achieve Goals Good    Potential Considerations Co-morbidities;Previous level of function             Patient will benefit from skilled therapeutic intervention in order to improve the following deficits and impairments:   Aphasia  Cognitive communication deficit    Problem List Patient Active Problem List   Diagnosis Date Noted   Need for hepatitis C screening test 11/10/2020   Pneumococcal vaccination administered at current visit 11/10/2020   Healthcare maintenance 11/06/2020   Hypoglycemia [id number]   Acute metabolic encephalopathy [id number]   Acute on chronic anemia 10/22/2020   Angiodysplasia of intestine    GI bleed 10/05/2020   Melena    Upper GI bleed    Iron deficiency anemia 05/22/2020   Constipation 05/22/2020   GIB (gastrointestinal bleeding) 05/21/2020   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 ([place])    History of CVA (cerebrovascular accident) 04/23/2020   Hyperlipidemia associated with type 2 diabetes mellitus ([place]) 04/23/2020   Alcohol use 04/23/2020   AKI (acute kidney injury) ([place]) 04/23/2020   Tobacco use 04/23/2020   Unresponsive 11/08/2018   Syncopal seizure ([place]) 11/08/2018   Hypertension associated with diabetes ([place]) 11/08/2018   Alcohol withdrawal seizure with complication, with unspecified complication ([place]) [id number]   Type 2 diabetes mellitus without complication ([place]) [id number]   Decreased hemoglobin 04/15/2018   Prostate cancer ([place]) 02/27/2014   Malignant neoplasm of prostate ([place]) 01/09/2014   Bunion 01/17/2013   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    Shelly Spenser, [place], CCC-SLP 11/17/2020, 2:20 PM  [facility] [address] [place], [place],  [address] Phone: [phone]   Fax:  [phone]  Name: Gregory Nunez. MRN: [id number] Date of Birth: 1945-06-15

## 2020-11-19 ENCOUNTER — Other Ambulatory Visit: Payer: Self-pay

## 2020-11-19 ENCOUNTER — Encounter: Payer: Self-pay | Admitting: Speech Pathology

## 2020-11-19 ENCOUNTER — Ambulatory Visit: Payer: Medicare Other | Admitting: Speech Pathology

## 2020-11-19 DIAGNOSIS — R41841 Cognitive communication deficit: Secondary | ICD-10-CM

## 2020-11-19 DIAGNOSIS — R4701 Aphasia: Secondary | ICD-10-CM

## 2020-11-19 NOTE — Patient Instructions (Addendum)
  Eggs - naming the supplies, get them out before you start cooking  Say all of the steps in making the eggs before you do it  Family: give clues rather than directions (ex: the stove still looks hot, what do you need to do?)  [place]

## 2020-11-19 NOTE — Therapy (Signed)
[facility] [address] [place], [place], [address] Phone: [phone]   Fax:  [phone]  Speech Language Pathology Treatment  Patient Details  Name: [place] Nunez. MRN: [id number] Date of Birth: November 04, 1945 Referring Provider (SLP): Dr. Velna Ochs   Encounter Date: 11/19/2020   End of Session - 11/19/20 1230     Visit Number 2    Number of Visits 25    Date for SLP Re-Evaluation 02/09/21    SLP Start Time 83    SLP Stop Time  [id number]    SLP Time Calculation (min) 43 min             Past Medical History:  Diagnosis Date   Cataract    OD   Depression    Diabetes mellitus without complication ([place])    Diabetic retinopathy ([place])    NPDR OU   GERD (gastroesophageal reflux disease)    if drinks alcohol   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   Patient is approximately 5-week status post bunion correction left foot   Hyperlipidemia    Hypertension    Hypertensive retinopathy    OU   Malignant neoplasm of prostate ([place]) 01/09/2014   Pancreatitis    Prostate cancer ([place]) 12/19/2013   Gleason 4+3=7, volume 31.31 cc   Shortness of breath dyspnea    with exertion     Past Surgical History:  Procedure Laterality Date   BIOPSY  04/16/2018   Procedure: BIOPSY;  Surgeon: Juanita Craver, MD;  Location: [facility];  Service: Endoscopy;;   BIOPSY  05/23/2020   Procedure: BIOPSY;  Surgeon: Jackquline Denmark, MD;  Location: [facility];  Service: Gastroenterology;;  EGD and COLON   biopsy on throat     hx of    CATARACT EXTRACTION Bilateral    Dr. Quentin Ore   COLONOSCOPY N/A 05/23/2020   Procedure: COLONOSCOPY;  Surgeon: Jackquline Denmark, MD;  Location: [facility];  Service: Gastroenterology;  Laterality: N/A;   ENTEROSCOPY N/A 10/07/2020   Procedure: ENTEROSCOPY;  Surgeon: [name], MD;  Location: [facility] END[facility]COPY;  Service: Endoscopy;  Laterality: N/A;   ESOPHAGOGASTRODUODENOSCOPY Left  04/16/2018   Procedure: ESOPHAGOGASTRODUODENOSCOPY (EGD);  Surgeon: Juanita Craver, MD;  Location: [facility];  Service: Endoscopy;  Laterality: Left;   ESOPHAGOGASTRODUODENOSCOPY (EGD) WITH PROPOFOL N/A 05/23/2020   Procedure: ESOPHAGOGASTRODUODENOSCOPY (EGD) WITH PROPOFOL;  Surgeon: Jackquline Denmark, MD;  Location: [facility];  Service: Gastroenterology;  Laterality: N/A;   EYE SURGERY     FOOT SURGERY     HOT HEMOSTASIS N/A 04/16/2018   Procedure: HOT HEMOSTASIS (ARGON PLASMA COAGULATION/BICAP);  Surgeon: Juanita Craver, MD;  Location: [facility];  Service: Endoscopy;  Laterality: N/A;   HOT HEMOSTASIS N/A 05/23/2020   Procedure: HOT HEMOSTASIS (ARGON PLASMA COAGULATION/BICAP);  Surgeon: Jackquline Denmark, MD;  Location: [facility];  Service: Gastroenterology;  Laterality: N/A;   LYMPHADENECTOMY Bilateral 02/27/2014   Procedure: BILATERAL LYMPHADENECTOMY;  Surgeon: Alexis Frock, MD;  Location: [facility];  Service: Urology;  Laterality: Bilateral;   POLYPECTOMY  05/23/2020   Procedure: POLYPECTOMY;  Surgeon: Jackquline Denmark, MD;  Location: [facility];  Service: Gastroenterology;;   PROSTATE BIOPSY  12/2013   Gleason 4+3=7, volume 31.31 cc   ROBOT ASSISTED LAPAROSCOPIC RADICAL PROSTATECTOMY N/A 02/27/2014   Procedure: ROBOTIC ASSISTED LAPAROSCOPIC RADICAL PROSTATECTOMY WITH INDOCYANINE GREEN DYE;  Surgeon: Alexis Frock, MD;  Location: [facility];  Service: Urology;  Laterality: N/A;    There were no vitals filed for this visit.   Subjective  Assessment - 11/19/20 0936     Subjective I um I um I um got some books    Currently in Pain? No/denies                   ADULT SLP TREATMENT - 11/19/20 0937       General Information   Behavior/Cognition Alert;Cooperative;Pleasant mood      Treatment Provided   Treatment provided Cognitive-Linquistic      Cognitive-Linquistic Treatment   Treatment focused on Aphasia;Cognition;Patient/family/caregiver education    Skilled Treatment Targeted  divergent naming and written expression generating 10 items in personally relevant category (NFL teams) - Pt independently named 4, and with semantic and phonemic cues named remaining 6. He required frequent written and verbal cues for spelling each team and mascot. With copying cues, he required frequent mod to max A for accuracy copying, likely due to attention and error awareness. He verbalized 8 items he needed to make scrambled eggs (4 with questioning cue and 4 with semantic /phonemic cues). Instructed pt and family in strategy of having all supplies and ingridients out prior to initiating cooking. Instructed niece to supervise Eileen in the kitchen at all times if he wants to cookProvided feedback for niece, [name] phonemic and 1st letter cues rather than telling him the word when working of speech at home.      Assessment / Recommendations / Plan   Plan Continue with current plan of care      Progression Toward Goals   Progression toward goals Progressing toward goals              SLP Education - 11/19/20 1217     Education Details have all ingridients and supplies out prior to cooking, supervise in the kitchen, verbalize steps and name supplies ingridients    Person(s) Educated Patient;Caregiver(s)    Methods Explanation;Demonstration;Verbal cues;Handout    Comprehension Verbalized understanding;Returned demonstration;Verbal cues required;Need further instruction              SLP Short Term Goals - 11/19/20 1229       SLP SHORT TERM GOAL #1   Title Pt will use external aids to recall am meds with 3 or less reminders from family over 1 week    Baseline family remidning his consistently every day    Time 4    Period Weeks    Status On-going      SLP SHORT TERM GOAL #2   Title Sloane will carryover 2 compensations for memory and attention to complete 3 house hold chores a week with occasiona min A from family    Baseline not completing house hold chores    Time 4     Period Weeks    Status On-going      SLP SHORT TERM GOAL #3   Title 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 18/20 sentences with occasional min A    Time 4    Period Weeks    Status On-going      SLP SHORT TERM GOAL #4   Title Pt will name 10 items for personally relevant category using verbal compensations as needed with occasional min A over 2 sessions    Time 4    Period Weeks    Status On-going      SLP SHORT TERM GOAL #5   Title Pt will fill out 2 money orders to pay bills with occasional min A from family    Baseline Nephew is paying his  bills for him    Time 4    Period Weeks    Status On-going              SLP Long Term Goals - 11/19/20 1230       SLP LONG TERM GOAL #1   Title Pt will use external aids and compensations to manage his medications, appointments and daily to do list with occasional min A from family    Baseline Family managing meds and appointments    Time 12    Period Weeks    Status On-going      SLP LONG TERM GOAL #2   Title 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 over 15 minute conversation to be 95% intelligible with occasional min A    Time 12    Period Weeks    Status On-going      SLP LONG TERM GOAL #3   Title Pt will ID aphasic errors in simple conversation with 3 or less verbal cues and attempt to self correct errors    Baseline Pt unaware of errors    Time 12    Period Weeks    Status On-going      SLP LONG TERM GOAL #4   Title Pt will use organization system, calendar to pay 3 bills on time with occasional min A from family.    Baseline family managing his finances    Time 12    Period Weeks    Status On-going      SLP LONG TERM GOAL #5   Title Family will rate Jahaan' communciation on [employer] Communicative Participation Item [employer] General Short Form a 12 or higher    Baseline rated a score of 9    Time 12    Period Weeks    Status On-going      SLP LONG TERM GOAL #6   Title Pt will  carryover 3 strategies for attention and memory to cook3  simple meals with supervision from family    Baseline not cooking    Time 12    Period Weeks    Status On-going              Plan - 11/19/20 1225     Clinical Impression Statement Moderate aphasia and cognitive impairments including memory, awareness, executive function, and attention. Word finding in structured tasks required mod A. Initiated training in compensatory strategies for cognition and aphasia, as well as cognitive and language activities to do at home. Pt continues to required supervision/family A for IADL's. Continue skilled ST to maximize cognition and language for improved independence, safety and pt's goal to return to his home independently.    Speech Therapy Frequency 2x / week    Duration 12 weeks    Treatment/Interventions Cueing hierarchy;Environmental controls;Language facilitation;SLP instruction and feedback;Compensatory strategies;Functional tasks;Cognitive reorganization;Compensatory techniques;Internal/external aids;Multimodal communcation approach;Patient/family education    Potential to Achieve Goals Good    Potential Considerations Co-morbidities;Previous level of function             Patient will benefit from skilled therapeutic intervention in order to improve the following deficits and impairments:   Aphasia  Cognitive communication deficit    Problem List Patient Active Problem List   Diagnosis Date Noted   Need for hepatitis C screening test 11/10/2020   Pneumococcal vaccination administered at current visit 11/10/2020   Healthcare maintenance 11/06/2020   Hypoglycemia [id number]   Acute metabolic encephalopathy [id number]   Acute on chronic anemia 10/22/2020   Angiodysplasia  of intestine    GI bleed 10/05/2020   Melena    Upper GI bleed    Iron deficiency anemia 05/22/2020   Constipation 05/22/2020   GIB (gastrointestinal bleeding) 05/21/2020   Diabetic retinopathy  associated with type 2 diabetes mellitus ([place])    History of CVA (cerebrovascular accident) 04/23/2020   Hyperlipidemia associated with type 2 diabetes mellitus ([place]) 04/23/2020   Alcohol use 04/23/2020   AKI (acute kidney injury) ([place]) 04/23/2020   Tobacco use 04/23/2020   Unresponsive 11/08/2018   Syncopal seizure ([place]) 11/08/2018   Hypertension associated with diabetes ([place]) 11/08/2018   Alcohol withdrawal seizure with complication, with unspecified complication ([place]) [id number]   Type 2 diabetes mellitus without complication ([place]) [id number]   Decreased hemoglobin 04/15/2018   Prostate cancer ([place]) 02/27/2014   Malignant neoplasm of prostate ([place]) 01/09/2014   Bunion 01/17/2013   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 [place], [place], [address] Phone: [phone]   Fax:  [phone]   Name: Cashis Dierker. MRN: [id number] Date of Birth: May 08, 1945

## 2020-11-20 ENCOUNTER — Encounter: Payer: Medicare Other | Admitting: Occupational Therapy

## 2020-11-20 ENCOUNTER — Ambulatory Visit: Payer: Medicare Other | Admitting: Physical Therapy

## 2020-11-25 ENCOUNTER — Other Ambulatory Visit: Payer: Self-pay

## 2020-11-25 ENCOUNTER — Encounter: Payer: Self-pay | Admitting: Physical Therapy

## 2020-11-25 ENCOUNTER — Ambulatory Visit: Payer: Medicare Other | Admitting: Occupational Therapy

## 2020-11-25 ENCOUNTER — Ambulatory Visit: Payer: Medicare Other | Admitting: Physical Therapy

## 2020-11-25 DIAGNOSIS — Z9181 History of falling: Secondary | ICD-10-CM

## 2020-11-25 DIAGNOSIS — I69318 Other symptoms and signs involving cognitive functions following cerebral infarction: Secondary | ICD-10-CM

## 2020-11-25 DIAGNOSIS — R2681 Unsteadiness on feet: Secondary | ICD-10-CM

## 2020-11-25 DIAGNOSIS — R278 Other lack of coordination: Secondary | ICD-10-CM

## 2020-11-25 DIAGNOSIS — R29818 Other symptoms and signs involving the nervous system: Secondary | ICD-10-CM

## 2020-11-25 DIAGNOSIS — M6281 Muscle weakness (generalized): Secondary | ICD-10-CM

## 2020-11-25 DIAGNOSIS — R4701 Aphasia: Secondary | ICD-10-CM | POA: Diagnosis not present

## 2020-11-25 NOTE — Therapy (Addendum)
[facility] [address] [place], [place], [address] Phone: [phone]   Fax:  [phone]  Physical Therapy Evaluation  Patient Details  Name: Gregory Nunez. MRN: [id number] Date of Birth: 11/13/1945 Referring Provider (PT): Velna Ochs, MD  Encounter Date: 11/25/2020   PT End of Session - 11/25/20 1013     Visit Number 1    Number of Visits 13    Date for PT Re-Evaluation 01/24/21    Authorization Type [employer] Medicare    PT Start Time 0931    PT Stop Time 1011    PT Time Calculation (min) 40 min    Equipment Utilized During Treatment Gait belt    Activity Tolerance Patient tolerated treatment well    Behavior During Therapy WFL for tasks assessed/performed             Past Medical History:  Diagnosis Date   Cataract    OD   Depression    Diabetes mellitus without complication ([place])    Diabetic retinopathy ([place])    NPDR OU   GERD (gastroesophageal reflux disease)    if drinks alcohol   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   Patient is approximately 5-week status post bunion correction left foot   Hyperlipidemia    Hypertension    Hypertensive retinopathy    OU   Malignant neoplasm of prostate ([place]) 01/09/2014   Pancreatitis    Prostate cancer ([place]) 12/19/2013   Gleason 4+3=7, volume 31.31 cc   Shortness of breath dyspnea    with exertion     Past Surgical History:  Procedure Laterality Date   BIOPSY  04/16/2018   Procedure: BIOPSY;  Surgeon: Juanita Craver, MD;  Location: [facility];  Service: Endoscopy;;   BIOPSY  05/23/2020   Procedure: BIOPSY;  Surgeon: Jackquline Denmark, MD;  Location: [facility];  Service: Gastroenterology;;  EGD and COLON   biopsy on throat     hx of    CATARACT EXTRACTION Bilateral    Dr. Quentin Ore   COLONOSCOPY N/A 05/23/2020   Procedure: COLONOSCOPY;  Surgeon: Jackquline Denmark, MD;  Location: [facility];  Service: Gastroenterology;  Laterality: N/A;    ENTEROSCOPY N/A 10/07/2020   Procedure: ENTEROSCOPY;  Surgeon: [name], MD;  Location: [facility];  Service: Endoscopy;  Laterality: N/A;   ESOPHAGOGASTRODUODENOSCOPY Left 04/16/2018   Procedure: ESOPHAGOGASTRODUODENOSCOPY (EGD);  Surgeon: Juanita Craver, MD;  Location: [facility];  Service: Endoscopy;  Laterality: Left;   ESOPHAGOGASTRODUODENOSCOPY (EGD) WITH PROPOFOL N/A 05/23/2020   Procedure: ESOPHAGOGASTRODUODENOSCOPY (EGD) WITH PROPOFOL;  Surgeon: Jackquline Denmark, MD;  Location: [facility];  Service: Gastroenterology;  Laterality: N/A;   EYE SURGERY     FOOT SURGERY     HOT HEMOSTASIS N/A 04/16/2018   Procedure: HOT HEMOSTASIS (ARGON PLASMA COAGULATION/BICAP);  Surgeon: Juanita Craver, MD;  Location: [facility];  Service: Endoscopy;  Laterality: N/A;   HOT HEMOSTASIS N/A 05/23/2020   Procedure: HOT HEMOSTASIS (ARGON PLASMA COAGULATION/BICAP);  Surgeon: Jackquline Denmark, MD;  Location: [facility];  Service: Gastroenterology;  Laterality: N/A;   LYMPHADENECTOMY Bilateral 02/27/2014   Procedure: BILATERAL LYMPHADENECTOMY;  Surgeon: Alexis Frock, MD;  Location: [facility];  Service: Urology;  Laterality: Bilateral;   POLYPECTOMY  05/23/2020   Procedure: POLYPECTOMY;  Surgeon: Jackquline Denmark, MD;  Location: [facility];  Service: Gastroenterology;;   PROSTATE BIOPSY  12/2013   Gleason 4+3=7, volume 31.31 cc   ROBOT ASSISTED LAPAROSCOPIC RADICAL PROSTATECTOMY N/A 02/27/2014   Procedure: ROBOTIC ASSISTED LAPAROSCOPIC RADICAL PROSTATECTOMY  WITH INDOCYANINE GREEN DYE;  Surgeon: Alexis Frock, MD;  Location: [facility];  Service: Urology;  Laterality: N/A;    There were no vitals filed for this visit.    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 Didnt receive any therapy for his CVA.  Walking with no AD. Pt's neice reports he has a cane, but doesn't use it. Just still a little wobbly and off balance. Has most difficulty with his speech. Hospitalized  recently where he was diagnosed with syncopal seizure, at which point he was started on Keppra 500 mg twice daily. Also pt with history of hypoglycemia requiring medical intervention, most recently in July 2022. Has had 2-3 falls in the bathtub.    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 No/denies                [facility] PT Assessment - 11/25/20 0939       Assessment   Medical Diagnosis CVA   recent seizure   Referring Provider (PT) Velna Ochs, MD    Onset Date/Surgical Date --   [facility] and feb 2022, CVA   Hand Dominance Right    Prior Therapy very small amount of home health      Precautions   Precaution Comments seizure, no driving, fall risk      Restrictions   Weight Bearing Restrictions No      Balance Screen   Has the patient fallen in the past 6 months Yes    How many times? 3    Has the patient had a decrease in activity level because of a fear of falling?  No    Is the patient reluctant to leave their home because of a fear of falling?  No      Home Ecologist residence    Living Arrangements Other (Comment)   lives with his neice, sister   Available Help at Discharge Family    Type of [place] Access Level entry    [place] - single point    Additional Comments prior to CVA was living alone in an apartment      Prior Function   Level of [place]   prior to Jan 2022   Vocation Retired    Leisure watching football      Editor, commissioning Impaired Detail    Additional Comments difficult to fully assess due to aphasia, more difficult to detect on RLE      Coordination   Gross Motor Movements are Fluid and  Coordinated Yes      Posture/Postural Control   Posture/Postural Control Postural limitations    Postural Limitations Rounded Shoulders;Forward head      ROM / Strength   AROM / PROM / Strength Strength      Strength   Strength Assessment Site Hip;Knee;Ankle    Right/Left Hip Right;Left    Right Hip Flexion 4+/5    Left Hip Flexion 5/5    Right/Left Knee Right;Left    Right Knee Flexion 5/5    Right Knee Extension 5/5    Left Knee  Flexion 5/5    Left Knee Extension 5/5    Right/Left Ankle Left;Right    Right Ankle Dorsiflexion 4+/5    Left Ankle Dorsiflexion 5/5      Transfers   Transfers Sit to Stand;Stand to Sit    Sit to Stand 5: Supervision;Without upper extremity assist    Five time sit to stand comments  20.88 seconds without UE support    Stand to Sit 5: Supervision;Without upper extremity assist      Ambulation/Gait   Ambulation/Gait Yes    Ambulation/Gait Assistance 5: Supervision    Assistive device None    Gait Pattern Step-through pattern    Ambulation Surface Level;Indoor    Gait velocity 11.18 seconds = 2.93 ft/sec      High Level Balance   High Level Balance Comments mCTSIB: condition 1: 30 seconds (incr lean to R), 2 and 3 = 30 seconds, mild postural sway, condition 4= 7 seconds      Functional Gait  Assessment   Gait assessed  Yes    Gait Level Surface Walks 20 ft, slow speed, abnormal gait pattern, evidence for imbalance or deviates 10-15 in outside of the 12 in walkway width. Requires more than 7 sec to ambulate 20 ft.   7.56 seconds   Change in Gait Speed Able to change speed, demonstrates mild gait deviations, deviates 6-10 in outside of the 12 in walkway width, or no gait deviations, unable to achieve a major change in velocity, or uses a change in velocity, or uses an assistive device.    Gait with Horizontal Head Turns Performs head turns with moderate changes in gait velocity, slows down, deviates 10-15 in outside 12 in walkway width but recovers,  can continue to walk.    Gait with Vertical Head Turns Performs head turns with no change in gait. Deviates no more than 6 in outside 12 in walkway width.    Gait and Pivot Turn Pivot turns safely in greater than 3 sec and stops with no loss of balance, or pivot turns safely within 3 sec and stops with mild imbalance, requires small steps to catch balance.    Step Over Obstacle Is able to step over 2 stacked shoe boxes taped together (9 in total height) without changing gait speed. No evidence of imbalance.    Gait with Narrow Base of Support Ambulates less than 4 steps heel to toe or cannot perform without assistance.    Gait with Eyes Closed Walks 20 ft, slow speed, abnormal gait pattern, evidence for imbalance, deviates 10-15 in outside 12 in walkway width. Requires more than 9 sec to ambulate 20 ft.   9.82 seconds   Ambulating Backwards Walks 20 ft, slow speed, abnormal gait pattern, evidence for imbalance, deviates 10-15 in outside 12 in walkway width.   14.41 seconds   Steps Alternating feet, must use rail.    Total Score 16    FGA comment: high fall risk                        Objective measurements completed on examination: See above findings.               PT Education - 11/25/20 1012     Education Details clinical findings, POC    Person(s) Educated Patient   niece   Methods Explanation    Comprehension Verbalized understanding              PT Short Term Goals -  11/25/20 1755       PT SHORT TERM GOAL #1   Title Pt will be independent with initial HEP in order to build up functional gains made in therapy. ALL STGS DUE 12/23/20    Time 4    Period Weeks    Status New    Target Date 12/23/20      PT SHORT TERM GOAL #2   Title Pt will decr 5x sit <> stand time to 18 seconds or less in order to demo improved functional BLE strength.    Baseline 20.88 seconds without UE support    Time 4    Period Weeks    Status New      PT SHORT TERM GOAL #3    Title Pt will improve gait speed with no AD to at least 3.3 ft/sec in order to demo improved gait efficiency.    Baseline 2.93 ft/sec    Time 4    Period Weeks    Status New      PT SHORT TERM GOAL #4   Title Pt and pt's family will verbalize understanding of fall prevention in the home in order to demo decr fall risk.    Time 4    Period Weeks    Status New      PT SHORT TERM GOAL #5   Title Pt will improve FGA score to at least a 19/30 in order to demo decr fall risk.    Baseline 16/30    Time 4    Period Weeks    Status New               PT Long Term Goals - 11/25/20 1757       PT LONG TERM GOAL #1   Title Pt will be independent with final HEP in order to build up functional gains made in therapy. ALL LTGS DUE 01/20/21    Time 8    Period Weeks    Status New    Target Date 01/20/21      PT LONG TERM GOAL #2   Title Pt will decr 5x sit <> stand time to 16 seconds or less in order to demo improved functional BLE strength.    Baseline 20.88 seconds without UE support    Time 8    Period Weeks    Status New      PT LONG TERM GOAL #3   Title Pt will ambulate at least 300' outdoors over unlevel surfaces with supervision in order to demo improved community mobility.    Time 8    Period Weeks    Status New      PT LONG TERM GOAL #4   Title Pt will improve FGA score to at least a 22/30 in order to demo decr fall risk.    Baseline 16/30    Time 8    Period Weeks    Status New      PT LONG TERM GOAL #5   Title Pt will improve condition 4 of mCTSIB to at least 20 seconds in order to demo improved vestibular input for balance.    Baseline 7 seconds    Time 8    Period Weeks    Status New                    Plan - 11/25/20 1749     Clinical Impression Statement Patient is a 75 year old male referred to Neuro OPPT s/p CVA x2  in Jan and Feb 2022. Pt only received a little bit of home health.   Pt with  recent syncopal seizure in June 2022 - started on  Keppra Pt's PMH is significant for: CVA, alcohol use, diabetes (and diabetic retionpathy), hyperlipidemia, hypertension, iron deficiency anemia, depression, GERD, prostate cancer The following deficits were present during the exam: decr strength, impaired sensation, gait abnormalities, postural abnormalities, impaired balance. Based on FGA and 5x sit <> stand, pt is an incr risk for falls. Pt would benefit from skilled PT to address these impairments and functional limitations to maximize functional mobility independence    Personal Factors and Comorbidities Comorbidity 3+;Past/Current Experience;Time since onset of injury/illness/exacerbation    Comorbidities CVA, alcohol use, diabetes, hyperlipidemia, hypertension, iron deficiency anemia, depression, GERD, prostate cancer    Examination-Activity Limitations Stairs;Transfers;Locomotion Level    Examination-Participation Restrictions Community Activity;Driving    Stability/Clinical Decision Making Stable/Uncomplicated    Clinical Decision Making Low    Rehab Potential Good    PT Frequency 2x / week   12 visits over 8 weeks   PT Duration 8 weeks   12 visits over 8 weeks   PT Treatment/Interventions ADLs/Self Care Home Management;DME Instruction;Gait training;Stair training;Therapeutic activities;Functional mobility training;Therapeutic exercise;Balance training;Patient/family education;Neuromuscular re-education;Vestibular    PT Next Visit Plan make sure pt gets scheduled for additional appts. initial HEP for functional BLE strength (sit <> stands), balance (eyes closed, narrow BOS, head motions)    Consulted and Agree with Plan of Care Patient;Family member/caregiver             Patient will benefit from skilled therapeutic intervention in order to improve the following deficits and impairments:  Abnormal gait, Decreased balance, Decreased strength, Impaired sensation, Postural dysfunction, Difficulty walking, Decreased activity  tolerance  Visit Diagnosis: Unsteadiness on feet  History of falling  Muscle weakness (generalized)  Other symptoms and signs involving the nervous system     Problem List Patient Active Problem List   Diagnosis Date Noted   Need for hepatitis C screening test 11/10/2020   Pneumococcal vaccination administered at current visit 11/10/2020   Healthcare maintenance 11/06/2020   Hypoglycemia [id number]   Acute metabolic encephalopathy [id number]   Acute on chronic anemia 10/22/2020   Angiodysplasia of intestine    GI bleed 10/05/2020   Melena    Upper GI bleed    Iron deficiency anemia 05/22/2020   Constipation 05/22/2020   GIB (gastrointestinal bleeding) 05/21/2020   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 ([place])    History of CVA (cerebrovascular accident) 04/23/2020   Hyperlipidemia associated with type 2 diabetes mellitus ([place]) 04/23/2020   Alcohol use 04/23/2020   AKI (acute kidney injury) ([place]) 04/23/2020   Tobacco use 04/23/2020   Unresponsive 11/08/2018   Syncopal seizure ([place]) 11/08/2018   Hypertension associated with diabetes ([place]) 11/08/2018   Alcohol withdrawal seizure with complication, with unspecified complication ([place]) [id number]   Type 2 diabetes mellitus without complication ([place]) [id number]   Decreased hemoglobin 04/15/2018   Prostate cancer ([place]) 02/27/2014   Malignant neoplasm of prostate ([place]) 01/09/2014   Bunion 01/17/2013   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    Arliss Journey, PT, DPT  11/25/2020, 6:00 PM  [facility] [address] [place], [place], [address] Phone: [phone]   Fax:  [phone]  Name: Gregory Nunez. MRN: [id number] Date of Birth: 04-05-1946

## 2020-11-25 NOTE — Therapy (Signed)
[facility] [address] [place], [place], [address] Phone: [phone]   Fax:  [phone]  Occupational Therapy Evaluation  Patient Details  Name: Gregory Nunez. MRN: [id number] Date of Birth: [birth year] Referring Provider (OT): Dr. Farrel Gordon   Encounter Date: 11/25/2020   OT End of Session - 11/25/20 0943     Visit Number 1    Number of Visits 21    Date for OT Re-Evaluation 02/07/21    Authorization Type [employer] MCR/MCD    Progress Note Due on Visit 10    OT Start Time [phone]    OT Stop Time 0930    OT Time Calculation (min) 41 min    Activity Tolerance Patient tolerated treatment well    Behavior During Therapy WFL for tasks assessed/performed             Past Medical History:  Diagnosis Date   Cataract    OD   Depression    Diabetes mellitus without complication ([place])    Diabetic retinopathy ([place])    NPDR OU   GERD (gastroesophageal reflux disease)    if drinks alcohol   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   Patient is approximately 5-week status post bunion correction left foot   Hyperlipidemia    Hypertension    Hypertensive retinopathy    OU   Malignant neoplasm of prostate ([place]) 01/09/2014   Pancreatitis    Prostate cancer ([place]) 12/19/2013   Gleason 4+3=7, volume 31.31 cc   Shortness of breath dyspnea    with exertion     Past Surgical History:  Procedure Laterality Date   BIOPSY  04/16/2018   Procedure: BIOPSY;  Surgeon: Juanita Craver, MD;  Location: [facility];  Service: Endoscopy;;   BIOPSY  05/23/2020   Procedure: BIOPSY;  Surgeon: Jackquline Denmark, MD;  Location: [facility];  Service: Gastroenterology;;  EGD and COLON   biopsy on throat     hx of    CATARACT EXTRACTION Bilateral    Dr. Quentin Ore   COLONOSCOPY N/A 05/23/2020   Procedure: COLONOSCOPY;  Surgeon: Jackquline Denmark, MD;  Location: [facility];  Service: Gastroenterology;  Laterality: N/A;   ENTEROSCOPY N/A  10/07/2020   Procedure: ENTEROSCOPY;  Surgeon: [name], MD;  Location: [facility] ENDOSCOPY;  Service: Endoscopy;  Laterality: N/A;   ESOPHAGOGASTRODUODENOSCOPY Left 04/16/2018   Procedure: ESOPHAGOGASTRODUODENOSCOPY (EGD);  Surgeon: Juanita Craver, MD;  Location: [facility];  Service: Endoscopy;  Laterality: Left;   ESOPHAGOGASTRODUODENOSCOPY (EGD) WITH PROPOFOL N/A 05/23/2020   Procedure: ESOPHAGOGASTRODUODENOSCOPY (EGD) WITH PROPOFOL;  Surgeon: Jackquline Denmark, MD;  Location: [facility];  Service: Gastroenterology;  Laterality: N/A;   EYE SURGERY     FOOT SURGERY     HOT HEMOSTASIS N/A 04/16/2018   Procedure: HOT HEMOSTASIS (ARGON PLASMA COAGULATION/BICAP);  Surgeon: Juanita Craver, MD;  Location: [facility];  Service: Endoscopy;  Laterality: N/A;   HOT HEMOSTASIS N/A 05/23/2020   Procedure: HOT HEMOSTASIS (ARGON PLASMA COAGULATION/BICAP);  Surgeon: Jackquline Denmark, MD;  Location: [facility];  Service: Gastroenterology;  Laterality: N/A;   LYMPHADENECTOMY Bilateral 02/27/2014   Procedure: BILATERAL LYMPHADENECTOMY;  Surgeon: Alexis Frock, MD;  Location: [facility];  Service: Urology;  Laterality: Bilateral;   POLYPECTOMY  05/23/2020   Procedure: POLYPECTOMY;  Surgeon: Jackquline Denmark, MD;  Location: [facility];  Service: Gastroenterology;;   PROSTATE BIOPSY  12/2013   Gleason 4+3=7, volume 31.31 cc   ROBOT ASSISTED LAPAROSCOPIC RADICAL PROSTATECTOMY N/A 02/27/2014   Procedure: ROBOTIC ASSISTED LAPAROSCOPIC RADICAL  PROSTATECTOMY WITH INDOCYANINE GREEN DYE;  Surgeon: Alexis Frock, MD;  Location: [facility];  Service: Urology;  Laterality: N/A;    There were no vitals filed for this visit.   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 No/denies                [facility] OT Assessment - 11/25/20 0001       Assessment   Medical Diagnosis CVA   recent seizure   Referring Provider (OT) Dr. Farrel Gordon    Onset Date/Surgical Date --   Jan & Feb 2022   Hand Dominance Right    Prior Therapy small amount of home health      Precautions   Precaution Comments seizure, no driving, fall risk      Restrictions   Weight Bearing Restrictions No      Balance Screen   Has the patient fallen in the past 6 months Yes    How many times? 3    Has the patient had a decrease in activity level because of a fear of falling?  No    Is the patient reluctant to leave their home because of a fear of falling?  No      Home  Environment   Bathroom Shower/Tub Tub/Shower unit   has fallen twice in [place] - single point;Bedside commode    Additional Comments Pt currently living with sister. Pt still has own place 8th floor apt w/ elevator. Pt lives w/ sister in 1 story home w/ level entry      Prior Function   Level of Independence Independent   prior to Jan 2022   Vocation Retired      ADL   Eating/Feeding Independent    Charity fundraiser -  Hygiene Independent    Tub/Shower Transfer Minimal assistance   to get him out, has had falls getting in/out     IADL   Shopping Needs to be accompanied on any shopping trip    Light Housekeeping Does not participate in any housekeeping tasks    Meal Prep Needs to have meals prepared and served    [employer] on family or friends for transportation    Medication Management Is not capable of dispensing or managing own medication    Financial Management --   nephew does     Psychologist, forensic Expression    Dominant Hand Right    Handwriting Severe micrographia;90% legible      Vision - History   Visual History Retinopathy    Additional Comments denies changes from stroke      Cognition   Area of Impairment Attention;Memory;Following commands;Safety/judgement;Awareness;Problem solving    Current Attention Level Sustained    Memory Decreased short-term memory    Following Commands Follows one step commands consistently;Follows multi-step commands inconsistently    Safety/Judgement Decreased awareness of deficits;Decreased  awareness of safety    Awareness Intellectual    Awareness Comments impaired    Problem Solving Slow processing;Decreased initiation    Executive Function Reasoning;Organizing;Decision Making;Self Monitoring;Self Correcting    Cognition Comments aphasia noted - see speech eval for details      Sensation   Light Touch Impaired Detail    Additional Comments Intact for light touch Rt hand but diminshed compared to Lt. Difficult to fully assess due to aphasia      Coordination   9 Hole Peg Test Right;Left    Right 9 Hole Peg Test 1 minute    Left 9 Hole Peg Test 47.47 sec      Perception   Perception Impaired    Inattention/Neglect Does not attend to right visual field      Edema   Edema very mild Rt hand      ROM / Strength   AROM / PROM / Strength AROM;Strength      AROM   Overall AROM Comments BUE AROM WFL's      Strength   Overall Strength Comments MMT grossly 4/5 RUE, 5/5 LUE      Hand Function   Right Hand Grip (lbs) 51.1 LBS    Left Hand Grip (lbs) 60.8 LBS                               OT Short Term Goals - 11/25/20 [id number]       OT SHORT TERM GOAL #1   Title Independent with initial HEP for bilateral coordination and Rt grip strength    Time 5    Period Weeks    Status New      OT SHORT TERM GOAL #2   Title Pt/family to verbalize acquisition of tub bench to prevent falls    Time 5    Period Weeks    Status New       OT SHORT TERM GOAL #3   Title Pt to consistently perform snack prep and heat up microwaveable items    Time 5    Period Weeks    Status New      OT SHORT TERM GOAL #4   Title Pt to wash dishes and fold towels consistently    Time 5    Period Weeks    Status New      OT SHORT TERM GOAL #5   Title Pt to improve coordination bilaterally by 10 sec    Baseline Rt = 1 min, Lt = 47 sec    Time 5    Period Weeks    Status New               OT Long Term Goals - 11/25/20 0956       OT LONG TERM GOAL #1   Title Independent with UE HEP for strengthening    Time 10    Period Weeks    Status New      OT LONG TERM GOAL #2   Title Pt to improve Rt grip strength by 5 lbs or greater    Baseline 51 lbs (Lt = 60 lbs)    Time 10    Period Weeks    Status New      OT LONG TERM GOAL #3   Title Pt to perform simple cooking task (egg) with distant supervision    Time 10    Period Weeks    Status New  OT LONG TERM GOAL #4   Title Pt to consistently perform light IADLS (all cleaning) at mod I level    Time 10    Period Weeks    Status New      OT LONG TERM GOAL #5   Title Pt to perform simple financial management with direct supervision    Time 10    Period Weeks    Status New                   Plan - 11/25/20 0944     Clinical Impression Statement Pt is a 75 y.o. 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  2. Additional 5 mm subacute ischemic nonhemorrhagic right periatrial  white matter infarct.  3. Underlying age-related cerebral atrophy with moderate chronic  microvascular ischemic disease, with superimposed remote lacunar  infarcts involving the left paramedian pons and posterior right  frontoparietal corona radiata) and recent syncopal seizure in June 2022 - started on Keppra. Pt also with DM and diabetic retinopathy, HTN,  HLD. Pt presents today with decreased cognition, coordination, overall strength and balance, and aphasia and would benefit from skilled OT to address these deficits and improve participation in [place] in hopes of returning to living alone.    OT Occupational Profile and History Detailed Assessment- Review of Records and additional review of physical, cognitive, psychosocial history related to current functional performance    Occupational performance deficits (Please refer to evaluation for details): IADL's;ADL's;Social Participation    Body Structure / Function / Physical Skills Strength;Dexterity;UE functional use;IADL;Endurance;Mobility;Coordination;FMC;Vision    Cognitive Skills Problem Solve;Sequencing;Perception;Learn    Rehab Potential Good    Clinical Decision Making Several treatment options, min-mod task modification necessary    Comorbidities Affecting Occupational Performance: Presence of comorbidities impacting occupational performance    Comorbidities impacting occupational performance description: seizure, diabetic retinopathy    Modification or Assistance to Complete Evaluation  No modification of tasks or assist necessary to complete eval    OT Frequency 2x / week    OT Duration --   10 weeks (anticipate only 6-8 weeks)   OT Treatment/Interventions Self-care/ADL training;DME and/or AE instruction;Therapeutic activities;Therapeutic exercise;Cognitive remediation/compensation;Coping strategies training;Visual/perceptual remediation/compensation;Functional Mobility Training;Neuromuscular education;Manual Therapy;Patient/family education    Plan issue handout on tub bench and referral form for MD to sign, coordination HEP for bilateral hands and putty HEP for Rt hand    Consulted and Agree with Plan of Care Patient;Family member/caregiver    Family Member Consulted niece [name])             Patient will benefit from skilled therapeutic intervention in order to improve the  following deficits and impairments:   Body Structure / Function / Physical Skills: Strength, Dexterity, UE functional use, IADL, Endurance, Mobility, Coordination, FMC, Vision Cognitive Skills: Problem Solve, Sequencing, Perception, Learn     Visit Diagnosis: Other symptoms and signs involving cognitive functions following cerebral infarction  Other lack of coordination  Unsteadiness on feet  Muscle weakness (generalized)    Problem List Patient Active Problem List   Diagnosis Date Noted   Need for hepatitis C screening test 11/10/2020   Pneumococcal vaccination administered at current visit 11/10/2020   Healthcare maintenance 11/06/2020   Hypoglycemia [id number]   Acute metabolic encephalopathy [id number]   Acute on chronic anemia 10/22/2020   Angiodysplasia of intestine    GI bleed  10/05/2020   Melena    Upper GI bleed    Iron deficiency anemia 05/22/2020   Constipation 05/22/2020   GIB (gastrointestinal bleeding) 05/21/2020   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 ([place])    History of CVA (cerebrovascular accident) 04/23/2020   Hyperlipidemia associated with type 2 diabetes mellitus ([place]) 04/23/2020   Alcohol use 04/23/2020   AKI (acute kidney injury) ([place]) 04/23/2020   Tobacco use 04/23/2020   Unresponsive 11/08/2018   Syncopal seizure ([place]) 11/08/2018   Hypertension associated with diabetes ([place]) 11/08/2018   Alcohol withdrawal seizure with complication, with unspecified complication ([place]) [id number]   Type 2 diabetes mellitus without complication ([place]) [id number]   Decreased hemoglobin 04/15/2018   Prostate cancer ([place]) 02/27/2014   Malignant neoplasm of prostate ([place]) 01/09/2014   Bunion 01/17/2013   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    Carey Bullocks, OTR/L 11/25/2020, 10:03 AM  [facility] [address] [place], [place], [address] Phone: [phone]   Fax:   [phone]  Name: Gregory Nunez. MRN: [id number] Date of Birth: [birth year]

## 2020-11-25 NOTE — Addendum Note (Signed)
Addended by: Arliss Journey on: 11/25/2020 06:01 PM   Modules accepted: Orders

## 2020-11-26 ENCOUNTER — Ambulatory Visit: Payer: Medicare Other | Admitting: Speech Pathology

## 2020-11-26 ENCOUNTER — Ambulatory Visit: Payer: Medicare Other | Admitting: Physical Therapy

## 2020-11-26 VITALS — BP 93/47 | HR 62

## 2020-11-26 DIAGNOSIS — R29818 Other symptoms and signs involving the nervous system: Secondary | ICD-10-CM

## 2020-11-26 DIAGNOSIS — R2681 Unsteadiness on feet: Secondary | ICD-10-CM

## 2020-11-26 DIAGNOSIS — R41841 Cognitive communication deficit: Secondary | ICD-10-CM

## 2020-11-26 DIAGNOSIS — R278 Other lack of coordination: Secondary | ICD-10-CM

## 2020-11-26 DIAGNOSIS — R4701 Aphasia: Secondary | ICD-10-CM | POA: Diagnosis not present

## 2020-11-26 DIAGNOSIS — M6281 Muscle weakness (generalized): Secondary | ICD-10-CM

## 2020-11-26 DIAGNOSIS — Z9181 History of falling: Secondary | ICD-10-CM

## 2020-11-26 NOTE — Therapy (Signed)
[facility] [address] [place], [place], [address] Phone: [phone]   Fax:  [phone]  Patient Details  Name: Gregory Nunez. MRN: [id number] Date of Birth: [birth year] Referring Provider:  Velna Ochs, MD  Encounter Date: 11/26/2020   [name] had hypotensive episodes during PT. ST session was deferred today due to blood pressure. Family will continue to monitor throughout the day and call MD if hypotension persists.    Annaka Cleaver, Annye Rusk MS, CCC-SLP 11/26/2020, 9:41 AM  [facility] [address] [place], [place], [address] Phone: [phone]   Fax:  [phone]

## 2020-11-26 NOTE — Therapy (Signed)
[facility] [address] [place], [place], [address] Phone: [phone]   Fax:  [phone]  Physical Therapy Treatment  Patient Details  Name: Gregory Nunez. MRN: [id number] Date of Birth: [birth year] Referring Provider (PT): Velna Ochs, MD   Encounter Date: 11/26/2020   PT End of Session - 11/26/20 0844     Visit Number 2    Number of Visits 13    Date for PT Re-Evaluation 01/24/21    Authorization Type [employer] Medicare    PT Start Time 0844    PT Stop Time 0935    PT Time Calculation (min) 51 min    Equipment Utilized During Treatment Gait belt    Activity Tolerance Patient tolerated treatment well    Behavior During Therapy WFL for tasks assessed/performed             Past Medical History:  Diagnosis Date   Cataract    OD   Depression    Diabetes mellitus without complication ([place])    Diabetic retinopathy ([place])    NPDR OU   GERD (gastroesophageal reflux disease)    if drinks alcohol   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   Patient is approximately 5-week status post bunion correction left foot   Hyperlipidemia    Hypertension    Hypertensive retinopathy    OU   Malignant neoplasm of prostate ([place]) 01/09/2014   Pancreatitis    Prostate cancer ([place]) 12/19/2013   Gleason 4+3=7, volume 31.31 cc   Shortness of breath dyspnea    with exertion     Past Surgical History:  Procedure Laterality Date   BIOPSY  04/16/2018   Procedure: BIOPSY;  Surgeon: Juanita Craver, MD;  [facility]: [facility];  Service: Endoscopy;;   BIOPSY  05/23/2020   Procedure: BIOPSY;  Surgeon: Jackquline Denmark, MD;  [facility];  Service: Gastroenterology;;  EGD and COLON   biopsy on throat     hx of    CATARACT EXTRACTION Bilateral    Dr. Quentin Ore   COLONOSCOPY N/A 05/23/2020   Procedure: COLONOSCOPY;  Surgeon: Jackquline Denmark, MD;  [facility];  Service: Gastroenterology;  Laterality: N/A;    ENTEROSCOPY N/A 10/07/2020   Procedure: ENTEROSCOPY;  Surgeon: [name], MD;  Location[facility]COPY;  Service: Endoscopy;  Laterality: N/A;   ESOPHAGOGASTRODUODENOSCOPY Left 04/16/2018   Procedure: ESOPHAGOGASTRODUODENOSCOPY (EGD);  Surgeon: Juanita Craver, MD;  Location: [facility];  Service: Endoscopy;  Laterality: Left;   ESOPHAGOGASTRODUODENOSCOPY (EGD) WITH PROPOFOL N/A 05/23/2020   Procedure: ESOPHAGOGASTRODUODENOSCOPY (EGD) WITH PROPOFOL;  Surgeon: Jackquline Denmark, MD;  [facility];  Service: Gastroenterology;  Laterality: N/A;   EYE SURGERY     FOOT SURGERY     HOT HEMOSTASIS N/A 04/16/2018   Procedure: HOT HEMOSTASIS (ARGON PLASMA COAGULATION/BICAP);  Surgeon: Juanita Craver, MD;  Location: [facility];  Service: Endoscopy;  Laterality: N/A;   HOT HEMOSTASIS N/A 05/23/2020   Procedure: HOT HEMOSTASIS (ARGON PLASMA COAGULATION/BICAP);  Surgeon: Jackquline Denmark, MD;  Location: [facility];  Service: Gastroenterology;  Laterality: N/A;   LYMPHADENECTOMY Bilateral 02/27/2014   Procedure: BILATERAL LYMPHADENECTOMY;  Surgeon: Alexis Frock, MD;  [facility];  Service: Urology;  Laterality: Bilateral;   POLYPECTOMY  05/23/2020   Procedure: POLYPECTOMY;  Surgeon: Jackquline Denmark, MD;  [facility];  Service: Gastroenterology;;   PROSTATE BIOPSY  12/2013   Gleason 4+3=7, volume 31.31 cc   ROBOT ASSISTED LAPAROSCOPIC RADICAL PROSTATECTOMY N/A 02/27/2014   Procedure: ROBOTIC ASSISTED LAPAROSCOPIC RADICAL  PROSTATECTOMY WITH INDOCYANINE GREEN DYE;  Surgeon: Alexis Frock, MD;  [facility];  Service: Urology;  Laterality: N/A;    Vitals:   11/26/20 0913  BP: (!) 93/47  Pulse: 62     Subjective Assessment - 11/26/20 0847     Subjective No falls noted. No changes reported.    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 No/denies                               [facility] Adult PT Treatment/Exercise - 11/26/20 0001       Exercises   Exercises Knee/Hip      Knee/Hip Exercises: Standing   Heel Raises Both;3 sets;10 reps    Other Standing Knee Exercises Toe raises 3x10      Knee/Hip Exercises: Seated   Sit to Sand 2 sets;10 reps;without UE support   staggered stance                Balance Exercises - 11/26/20 0001       Balance Exercises: Standing   Standing Eyes Closed Narrow base of support (BOS)   static standing 2x30 sec; 2x10 head turns and then head nods   Tandem Stance Eyes open;2 reps;20 secs;Intermittent upper extremity support                 PT Short Term Goals - 11/25/20 1755       PT SHORT TERM GOAL #1   Title Pt will be independent with initial HEP in order to build up functional gains made in therapy. ALL STGS DUE 12/23/20    Time 4    Period Weeks    Status New    Target Date 12/23/20      PT SHORT TERM GOAL #2   Title Pt will decr 5x sit <> stand time to 18 seconds or less in order to demo improved functional BLE strength.    Baseline 20.88 seconds without UE support    Time 4    Period Weeks    Status New      PT SHORT TERM GOAL #3   Title Pt will improve gait speed with no AD to at least 3.3 ft/sec in order to demo improved gait efficiency.    Baseline 2.93 ft/sec    Time 4    Period Weeks    Status New      PT SHORT TERM GOAL #4   Title Pt and pt's family will verbalize understanding of fall prevention in the home in order to demo decr fall risk.    Time 4    Period Weeks    Status New      PT SHORT TERM GOAL #5   Title Pt will improve FGA score to at least a 19/30 in order to demo decr fall risk.    Baseline 16/30    Time 4    Period Weeks    Status New               PT Long Term Goals - 11/25/20 1757       PT  LONG TERM GOAL #1   Title Pt will be independent with final HEP in  order to build up functional gains made in therapy. ALL LTGS DUE 01/20/21    Time 8    Period Weeks    Status New    Target Date 01/20/21      PT LONG TERM GOAL #2   Title Pt will decr 5x sit <> stand time to 16 seconds or less in order to demo improved functional BLE strength.    Baseline 20.88 seconds without UE support    Time 8    Period Weeks    Status New      PT LONG TERM GOAL #3   Title Pt will ambulate at least 300' outdoors over unlevel surfaces with supervision in order to demo improved community mobility.    Time 8    Period Weeks    Status New      PT LONG TERM GOAL #4   Title Pt will improve FGA score to at least a 22/30 in order to demo decr fall risk.    Baseline 16/30    Time 8    Period Weeks    Status New      PT LONG TERM GOAL #5   Title Pt will improve condition 4 of mCTSIB to at least 20 seconds in order to demo improved vestibular input for balance.    Baseline 7 seconds    Time 8    Period Weeks    Status New                   Plan - 11/26/20 0911     Clinical Impression Statement Treatment focused on establishing a strength and balance HEP for pt. Ended session early -- Pt limited by fatigue and would need to close his eyes and take multiple sitting rest breaks between sets. Pt found to be hypotensive with BP at 93/47 HR 64. Pt did not report any dizziness. Rest of session, pt laid supine which increased BP to his baseline 157/93. Return to sitting 125/59. Standing 111/45. Unsure if this is just a one time exercise response but discussed with family extensively to let pt rest today and check it again at home in sitting and standing this evening. If it remains low, they will need to get a medical work up.    Personal Factors and Comorbidities Comorbidity 3+;Past/Current Experience;Time since onset of injury/illness/exacerbation    Comorbidities CVA, alcohol use, diabetes,  hyperlipidemia, hypertension, iron deficiency anemia, depression, GERD, prostate cancer    Examination-Activity Limitations Stairs;Transfers;Locomotion Level    Examination-Participation Restrictions Community Activity;Driving    Stability/Clinical Decision Making Stable/Uncomplicated    Rehab Potential Good    PT Frequency 2x / week   12 visits over 8 weeks   PT Duration 8 weeks   12 visits over 8 weeks   PT Treatment/Interventions ADLs/Self Care Home Management;DME Instruction;Gait training;Stair training;Therapeutic activities;Functional mobility training;Therapeutic exercise;Balance training;Patient/family education;Neuromuscular re-education;Vestibular    PT Next Visit Plan Check BP. Review HEP for functional BLE strength (sit <> stands), balance (eyes closed, narrow BOS, head motions)    PT Home Exercise Plan Access Code [email or web address]    Consulted and Agree with Plan of Care Patient;Family member/caregiver    Family Member Consulted Sister             Patient will benefit from skilled therapeutic intervention in order to improve the following deficits and impairments:  Abnormal gait, Decreased balance, Decreased strength, Impaired sensation, Postural dysfunction, Difficulty walking, Decreased activity tolerance  Visit Diagnosis: Other lack of coordination  Unsteadiness  on feet  Muscle weakness (generalized)  History of falling  Other symptoms and signs involving the nervous system     Problem List Patient Active Problem List   Diagnosis Date Noted   Need for hepatitis C screening test 11/10/2020   Pneumococcal vaccination administered at current visit 11/10/2020   Healthcare maintenance 11/06/2020   Hypoglycemia [id number]   Acute metabolic encephalopathy [id number]   Acute on chronic anemia 10/22/2020   Angiodysplasia of intestine    GI bleed 10/05/2020   Melena    Upper GI bleed    Iron deficiency anemia 05/22/2020   Constipation 05/22/2020   GIB (gastrointestinal  bleeding) 05/21/2020   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 ([place])    History of CVA (cerebrovascular accident) 04/23/2020   Hyperlipidemia associated with type 2 diabetes mellitus ([place]) 04/23/2020   Alcohol use 04/23/2020   AKI (acute kidney injury) ([place]) 04/23/2020   Tobacco use 04/23/2020   Unresponsive 11/08/2018   Syncopal seizure ([place]) 11/08/2018   Hypertension associated with diabetes ([place]) 11/08/2018   Alcohol withdrawal seizure with complication, with unspecified complication ([place]) XX123456   Type 2 diabetes mellitus without complication ([place]) [id number]   Decreased hemoglobin 04/15/2018   Prostate cancer ([place]) 02/27/2014   Malignant neoplasm of prostate (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 MRN: [id number] Date of Birth: 1946/02/18

## 2020-11-28 ENCOUNTER — Ambulatory Visit: Payer: Medicare Other

## 2020-11-28 ENCOUNTER — Other Ambulatory Visit: Payer: Self-pay

## 2020-11-28 DIAGNOSIS — R4701 Aphasia: Secondary | ICD-10-CM

## 2020-11-28 DIAGNOSIS — R41841 Cognitive communication deficit: Secondary | ICD-10-CM

## 2020-11-28 NOTE — Therapy (Signed)
[facility] [address] [place], [place], [address] Phone: [phone]   Fax:  [phone]  Speech Language Pathology Treatment  Patient Details  Name: Braedan Varin. MRN: [id number] Date of Birth: 1945/10/15 Referring Provider (SLP): Dr. 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   Patient is approximately 5-week status post bunion correction left foot   Hyperlipidemia    Hypertension    Hypertensive retinopathy    OU   Malignant neoplasm of prostate ([place]) 01/09/2014   Pancreatitis    Prostate cancer ([place]) 12/19/2013   Gleason 4+3=7, volume 31.31 cc   Shortness of breath dyspnea    with exertion     Past Surgical History:  Procedure Laterality Date   BIOPSY  04/16/2018   Procedure: BIOPSY;  Surgeon: Juanita Craver, MD;  [facility];  Service: Endoscopy;;   BIOPSY  05/23/2020   Procedure: BIOPSY;  Surgeon: Jackquline Denmark, MD;  [facility];  Service: Gastroenterology;;  EGD and COLON   biopsy on throat     hx of    CATARACT EXTRACTION Bilateral    Dr. Quentin Ore   COLONOSCOPY N/A 05/23/2020   Procedure: COLONOSCOPY;  Surgeon: Jackquline Denmark, MD;  [facility];  Service: Gastroenterology;  Laterality: N/A;   ENTEROSCOPY N/A 10/07/2020   Procedure: ENTEROSCOPY;  Surgeon: [name], MD;  Location: [facility]COPY;  Service: Endoscopy;   Laterality: N/A;   ESOPHAGOGASTRODUODENOSCOPY Left 04/16/2018   Procedure: ESOPHAGOGASTRODUODENOSCOPY (EGD);  Surgeon: Juanita Craver, MD;  Location: [facility];  Service: Endoscopy;  Laterality: Left;   ESOPHAGOGASTRODUODENOSCOPY (EGD) WITH PROPOFOL N/A 05/23/2020   Procedure: ESOPHAGOGASTRODUODENOSCOPY (EGD) WITH PROPOFOL;  Surgeon: Jackquline Denmark, MD;  [facility];  Service: Gastroenterology;  Laterality: N/A;   EYE SURGERY     FOOT SURGERY     HOT HEMOSTASIS N/A 04/16/2018   Procedure: HOT HEMOSTASIS (ARGON PLASMA COAGULATION/BICAP);  Surgeon: Juanita Craver, MD;  Location: [facility];  Service: Endoscopy;  Laterality: N/A;   HOT HEMOSTASIS N/A 05/23/2020   Procedure: HOT HEMOSTASIS (ARGON PLASMA COAGULATION/BICAP);  Surgeon: Jackquline Denmark, MD;  Location: [facility];  Service: Gastroenterology;  Laterality: N/A;   LYMPHADENECTOMY Bilateral 02/27/2014   Procedure: BILATERAL LYMPHADENECTOMY;  Surgeon: Alexis Frock, MD;  [facility];  Service: Urology;  Laterality: Bilateral;   POLYPECTOMY  05/23/2020   Procedure: POLYPECTOMY;  Surgeon: Jackquline Denmark, MD;  [facility];  Service: Gastroenterology;;   PROSTATE BIOPSY  12/2013   Gleason 4+3=7, volume 31.31 cc   ROBOT ASSISTED LAPAROSCOPIC RADICAL PROSTATECTOMY N/A 02/27/2014   Procedure: ROBOTIC ASSISTED LAPAROSCOPIC RADICAL PROSTATECTOMY WITH INDOCYANINE GREEN DYE;  Surgeon: Alexis Frock, MD;  [facility];  Service: Urology;  Laterality: N/A;    There were  no vitals filed for this visit.   Subjective Assessment - 11/28/20 0940     Subjective "My words my words my words at home come out pretty good. Sometimes - uh uh it at home with my words it - it gets deeper."    Patient is accompained by: Family member   Mikle Bosworth - sister   Currently in Pain? No/denies                   ADULT SLP TREATMENT - 11/28/20 0945       General Information   Behavior/Cognition Alert;Cooperative;Pleasant mood       Treatment Provided   Treatment provided Cognitive-Linquistic      Cognitive-Linquistic Treatment   Treatment focused on Aphasia;Cognition;Patient/family/caregiver education    Skilled Treatment With pt's "s" statement, SLP spent time having pt read conversational sentneces with slow rate - pt with some aphasic errors but maintained slow rate 90% of the time. SLP targeted verbal exression with more-simple sentence completion with 95% success; more open-ended completion (i.e., "On Saturday,...") resulted in pt requiring mod A occasionally for completion - with this cue level pt was 95% successful. SLP then engaged pt in conversation and pt with same level of expression as in initial 5 minutes of session. None of pt's homework was completed so SLP strongly stressed teh need for homework, explained that family could assist  if necessary, and provided some education how to do this to pt's sister.      Assessment / Recommendations / Plan   Plan Continue with current plan of care      Progression Toward Goals   Progression toward goals Progressing toward goals              SLP Education - 11/28/20 1317     Education Details homework is necessary for progress, cueing for homework    Person(s) Educated Patient;Caregiver(s)    Methods Explanation;Demonstration    Comprehension Verbalized understanding;Need further instruction              SLP Short Term Goals - 11/28/20 1318       SLP SHORT TERM GOAL #1   Title Pt will use external aids to recall am meds with 3 or less reminders from family over 1 week    Baseline family remidning his consistently every day    Time 3    Period Weeks    Status On-going      SLP SHORT TERM GOAL #2   Title [place] will carryover 2 compensations for memory and attention to complete 3 house hold chores a week with occasiona min A from family    Baseline not completing house hold chores    Time 3    Period Weeks    Status On-going      SLP SHORT TERM  GOAL #3   Title 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 18/20 sentences with occasional min A    Time 3    Period Weeks    Status On-going      SLP SHORT TERM GOAL #4   Title Pt will name 10 items for personally relevant category using verbal compensations as needed with occasional min A over 2 sessions    Time 3    Period Weeks    Status On-going      SLP SHORT TERM GOAL #5   Title Pt will fill out 2 money orders to pay bills with occasional min A from family  Baseline Nephew is paying his bills for him    Time 3    Period Weeks    Status On-going              SLP Long Term Goals - 11/28/20 1318       SLP LONG TERM GOAL #1   Title Pt will use external aids and compensations to manage his medications, appointments and daily to do list with occasional min A from family    Baseline Family managing meds and appointments    Time 11    Period Weeks    Status On-going      SLP LONG TERM GOAL #2   Title 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 over 15 minute conversation to be 95% intelligible with occasional min A    Time 11    Period Weeks    Status On-going      SLP LONG TERM GOAL #3   Title Pt will ID aphasic errors in simple conversation with 3 or less verbal cues and attempt to self correct errors    Baseline Pt unaware of errors    Time 11    Period Weeks    Status On-going      SLP LONG TERM GOAL #4   Title Pt will use organization system, calendar to pay 3 bills on time with occasional min A from family.    Baseline family managing his finances    Time 11    Period Weeks    Status On-going      SLP LONG TERM GOAL #5   Title Family will rate [place]' communciation on The Communicative Participation Item [employer] General Short Form a 12 or higher    Baseline rated a score of 9    Time 11    Period Weeks    Status On-going      SLP LONG TERM GOAL #6   Title Pt will carryover 3 strategies for attention and memory to  cook3  simple meals with supervision from family    Baseline not cooking    Time 11    Period Weeks    Status On-going              Plan - 11/28/20 1318     Clinical Impression Statement Moderate aphasia and cognitive impairments including memory, awareness, executive function, and attention. Word finding in structured tasks required mod A. Initiated training in compensatory strategies for cognition and aphasia, as well as cognitive and language activities to do at home. Pt continues to required supervision/family A for IADL's. Continue skilled ST to maximize cognition and language for improved independence, safety and pt's goal to return to his home independently.    Speech Therapy Frequency 2x / week    Duration 12 weeks    Treatment/Interventions Cueing hierarchy;Environmental controls;Language facilitation;SLP instruction and feedback;Compensatory strategies;Functional tasks;Cognitive reorganization;Compensatory techniques;Internal/external aids;Multimodal communcation approach;Patient/family education    Potential to Achieve Goals Good    Potential Considerations Co-morbidities;Previous level of function             Patient will benefit from skilled therapeutic intervention in order to improve the following deficits and impairments:   Aphasia  Cognitive communication deficit    Problem List Patient Active Problem List   Diagnosis Date Noted   Need for hepatitis C screening test 11/10/2020   Pneumococcal vaccination administered at current visit 11/10/2020   Healthcare maintenance 11/06/2020   Hypoglycemia [id number]   Acute metabolic encephalopathy A999333   Acute on chronic  anemia 10/22/2020   Angiodysplasia of intestine    GI bleed 10/05/2020   Melena    Upper GI bleed    Iron deficiency anemia 05/22/2020   Constipation 05/22/2020   GIB (gastrointestinal bleeding) 05/21/2020   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 ([place])    History  of CVA (cerebrovascular accident) 04/23/2020   Hyperlipidemia associated with type 2 diabetes mellitus ([place]) 04/23/2020   Alcohol use 04/23/2020   AKI (acute kidney injury) ([place]) 04/23/2020   Tobacco use 04/23/2020   Unresponsive 11/08/2018   Syncopal seizure ([place]) 11/08/2018   Hypertension associated with diabetes ([place]) 11/08/2018   Alcohol withdrawal seizure with complication, with unspecified complication ([place]) [id number]   Type 2 diabetes mellitus without complication ([place]) [id number]   Decreased hemoglobin 04/15/2018   Prostate cancer ([place]) 02/27/2014   Malignant neoplasm of prostate ([place]) 01/09/2014   Bunion 01/17/2013   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    Crysta Gulick ,MS, CCC-SLP  11/28/2020, 1:19 PM  [facility] [address] [place], [place], [address] Phone: [phone]   Fax:  [phone]   Name: Alvie Mazzocchi. MRN: [id number] Date of Birth: [birth year]

## 2020-12-01 ENCOUNTER — Ambulatory Visit: Payer: Medicare Other | Admitting: Speech Pathology

## 2020-12-02 ENCOUNTER — Other Ambulatory Visit: Payer: Self-pay

## 2020-12-02 ENCOUNTER — Ambulatory Visit (HOSPITAL_COMMUNITY)
Admission: RE | Admit: 2020-12-02 | Discharge: 2020-12-02 | Disposition: A | Discharge status: Home / Self Care | Payer: Medicare Other | Source: Ambulatory Visit | Attending: Internal Medicine | Admitting: Internal Medicine

## 2020-12-02 DIAGNOSIS — R768 Other specified abnormal immunological findings in serum: Secondary | ICD-10-CM | POA: Diagnosis present

## 2020-12-03 ENCOUNTER — Ambulatory Visit: Payer: Medicare Other | Admitting: Speech Pathology

## 2020-12-03 ENCOUNTER — Ambulatory Visit: Payer: Medicare Other | Admitting: Occupational Therapy

## 2020-12-03 ENCOUNTER — Encounter: Payer: Self-pay | Admitting: Speech Pathology

## 2020-12-03 DIAGNOSIS — R2681 Unsteadiness on feet: Secondary | ICD-10-CM

## 2020-12-03 DIAGNOSIS — R41841 Cognitive communication deficit: Secondary | ICD-10-CM

## 2020-12-03 DIAGNOSIS — R4701 Aphasia: Secondary | ICD-10-CM | POA: Diagnosis not present

## 2020-12-03 DIAGNOSIS — M6281 Muscle weakness (generalized): Secondary | ICD-10-CM

## 2020-12-03 DIAGNOSIS — R278 Other lack of coordination: Secondary | ICD-10-CM

## 2020-12-03 NOTE — Patient Instructions (Signed)
  Read aloud a variety of things, sentences, articles, magazines, books  Focus on remembering you AM meds without your sisters help, set a timer if you have to  Do 2 HW sheets  Fold laundry and towels

## 2020-12-03 NOTE — Patient Instructions (Addendum)
  Coordination Activities  Perform the following activities for 10 minutes 1-2 times per day with both hand(s).  Rotate ball in fingertips (clockwise and counter-clockwise). Toss ball in air and catch with the same hand. Flip cards 1 at a time as fast as you can. Deal cards with your thumb (Hold deck in hand and push card off top with thumb). Rotate one card around in fingers clockwise and counterclockwise Pick up coins one at a time until you get 5 in your hand, then move coins from palm to fingertips to stack one at a time. Practice writing with Rt hand only   1. Grip Strengthening (Resistive Putty)   Squeeze putty using thumb and all fingers Rt hand. Repeat _20___ times. Do __2__ sessions per day.   2. Roll putty into tube on table and pinch between first two fingers and thumb x 10 reps each. Do 2 sessions per day     Copyright  VHI. All rights reserved.

## 2020-12-03 NOTE — Therapy (Signed)
[facility] [address] [place], [place], [address] Phone: [phone]   Fax:  [phone]  Speech Language Pathology Treatment  Patient Details  Name: Gregory Nunez. MRN: [id number] Date of Birth: 01/04/1946 Referring Provider (SLP): Dr. Velna Ochs   Encounter Date: 12/03/2020   End of Session - 12/03/20 1648     Visit Number 4    Number of Visits 25    Date for SLP Re-Evaluation 02/09/21    SLP Start Time 0933    SLP Stop Time  [id number]    SLP Time Calculation (min) 42 min    Activity Tolerance Patient tolerated treatment well             Past Medical History:  Diagnosis Date   Cataract    OD   Depression    Diabetes mellitus without complication ([place])    Diabetic retinopathy ([place])    NPDR OU   GERD (gastroesophageal reflux disease)    if drinks alcohol   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   Patient is approximately 5-week status post bunion correction left foot   Hyperlipidemia    Hypertension    Hypertensive retinopathy    OU   Malignant neoplasm of prostate ([place]) 01/09/2014   Pancreatitis    Prostate cancer ([place]) 12/19/2013   Gleason 4+3=7, volume 31.31 cc   Shortness of breath dyspnea    with exertion     Past Surgical History:  Procedure Laterality Date   BIOPSY  04/16/2018   Procedure: BIOPSY;  Surgeon: Juanita Craver, MD;  Location: [facility];  Service: Endoscopy;;   BIOPSY  05/23/2020   Procedure: BIOPSY;  Surgeon: Jackquline Denmark, MD;  Location: [facility];  Service: Gastroenterology;;  EGD and COLON   biopsy on throat     hx of    CATARACT EXTRACTION Bilateral    Dr. Quentin Ore   COLONOSCOPY N/A 05/23/2020   Procedure: COLONOSCOPY;  Surgeon: Jackquline Denmark, MD;  [facility];  Service: Gastroenterology;  Laterality: N/A;   ENTEROSCOPY N/A 10/07/2020   Procedure: ENTEROSCOPY;  Surgeon: [name], MD;  Location: [facility] ENDOSCOPY;  Service: Endoscopy;   Laterality: N/A;   ESOPHAGOGASTRODUODENOSCOPY Left 04/16/2018   Procedure: ESOPHAGOGASTRODUODENOSCOPY (EGD);  Surgeon: Juanita Craver, MD;  Location: [facility];  Service: Endoscopy;  Laterality: Left;   ESOPHAGOGASTRODUODENOSCOPY (EGD) WITH PROPOFOL N/A 05/23/2020   Procedure: ESOPHAGOGASTRODUODENOSCOPY (EGD) WITH PROPOFOL;  Surgeon: Jackquline Denmark, MD;  [facility];  Service: Gastroenterology;  Laterality: N/A;   EYE SURGERY     FOOT SURGERY     HOT HEMOSTASIS N/A 04/16/2018   Procedure: HOT HEMOSTASIS (ARGON PLASMA COAGULATION/BICAP);  Surgeon: Juanita Craver, MD;  Location: [facility];  Service: Endoscopy;  Laterality: N/A;   HOT HEMOSTASIS N/A 05/23/2020   Procedure: HOT HEMOSTASIS (ARGON PLASMA COAGULATION/BICAP);  Surgeon: Jackquline Denmark, MD;  Location: [facility];  Service: Gastroenterology;  Laterality: N/A;   LYMPHADENECTOMY Bilateral 02/27/2014   Procedure: BILATERAL LYMPHADENECTOMY;  Surgeon: Alexis Frock, MD;  [facility]: [facility];  Service: Urology;  Laterality: Bilateral;   POLYPECTOMY  05/23/2020   Procedure: POLYPECTOMY;  Surgeon: Jackquline Denmark, MD;  [facility];  Service: Gastroenterology;;   PROSTATE BIOPSY  12/2013   Gleason 4+3=7, volume 31.31 cc   ROBOT ASSISTED LAPAROSCOPIC RADICAL PROSTATECTOMY N/A 02/27/2014   Procedure: ROBOTIC ASSISTED LAPAROSCOPIC RADICAL PROSTATECTOMY WITH INDOCYANINE GREEN DYE;  Surgeon: Alexis Frock, MD;  Location: [facility];  Service: Urology;  Laterality: N/A;    There were  no vitals filed for this visit.   Subjective Assessment - 12/03/20 0940     Subjective "She has been asking him and reading over the sheet"    Currently in Pain? No/denies                   ADULT SLP TREATMENT - 12/03/20 0940       General Information   Behavior/Cognition Alert;Cooperative;Pleasant mood      Treatment Provided   Treatment provided Cognitive-Linquistic      Cognitive-Linquistic Treatment   Treatment focused on  Aphasia;Cognition;Patient/family/caregiver education    Skilled Treatment Caspian has not completed [place] and continues to rely on family for med and schedule management. He required mod A to ID next appointment with ST on his calendar and mod A to organizer his memory binder to more easlity access PT, OT and ST HEP/HW. Generated strategy of using an alarm if he is not remembering his am meds. He does have pill organizer. Naming and written expression targeted today with divergent task with personal category of [employer] basketball teams. He required extended time and frequent written cues to name 9 teams and mascots. Written expression required frequent mod written cues and consistent extended time for processing, even when copying cues provided. He required frequent mod to max A for written error awareness.      Assessment / Recommendations / Plan   Plan Continue with current plan of care      Progression Toward Goals   Progression toward goals Progressing toward goals              SLP Education - 12/03/20 1646     Education Details alarm for meds; do HW    Person(s) Educated Patient;Caregiver(s)    Methods Explanation;Demonstration;Handout    Comprehension Verbalized understanding;Need further instruction              SLP Short Term Goals - 12/03/20 1646       SLP SHORT TERM GOAL #1   Title Pt will use external aids to recall am meds with 3 or less reminders from family over 1 week    Baseline family remidning his consistently every day    Time 2    Period Weeks    Status On-going      SLP SHORT TERM GOAL #2   Title Bria will carryover 2 compensations for memory and attention to complete 3 house hold chores a week with occasiona min A from family    Baseline not completing house hold chores    Time 2    Period Weeks    Status On-going      SLP SHORT TERM GOAL #3   Title 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 18/20 sentences with occasional  min A    Time 2    Period Weeks    Status On-going      SLP SHORT TERM GOAL #4   Title Pt will name 10 items for personally relevant category using verbal compensations as needed with occasional min A over 2 sessions    Time 2    Period Weeks    Status On-going      SLP SHORT TERM GOAL #5   Title Pt will fill out 2 money orders to pay bills with occasional min A from family    Baseline Nephew is paying his bills for him    Time 2    Period Weeks    Status On-going  SLP Long Term Goals - 12/03/20 1647       SLP LONG TERM GOAL #1   Title Pt will use external aids and compensations to manage his medications, appointments and daily to do list with occasional min A from family    Baseline Family managing meds and appointments    Time 10    Period Weeks    Status On-going      SLP LONG TERM GOAL #2   Title 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 over 15 minute conversation to be 95% intelligible with occasional min A    Time 10    Period Weeks    Status On-going      SLP LONG TERM GOAL #3   Title Pt will ID aphasic errors in simple conversation with 3 or less verbal cues and attempt to self correct errors    Baseline Pt unaware of errors    Time 10    Period Weeks    Status On-going      SLP LONG TERM GOAL #4   Title Pt will use organization system, calendar to pay 3 bills on time with occasional min A from family.    Baseline family managing his finances    Time 10    Period Weeks    Status On-going      SLP LONG TERM GOAL #5   Title Family will rate [place]' communciation on [employer] Communicative Participation Item [employer] General Short Form a 12 or higher    Baseline rated a score of 9    Time 10    Period Weeks    Status On-going      SLP LONG TERM GOAL #6   Title Pt will carryover 3 strategies for attention and memory to cook3  simple meals with supervision from family    Baseline not cooking    Time 10    Period Weeks    Status On-going               Plan - 12/03/20 1646     Clinical Impression Statement Moderate aphasia and cognitive impairments including memory, awareness, executive function, and attention. Word finding in structured tasks required mod A. Initiated training in compensatory strategies for cognition and aphasia, as well as cognitive and language activities to do at home. Pt continues to required supervision/family A for IADL's. Continue skilled ST to maximize cognition and language for improved independence, safety and pt's goal to return to his home independently.    Speech Therapy Frequency 2x / week    Duration 12 weeks    Treatment/Interventions Cueing hierarchy;Environmental controls;Language facilitation;SLP instruction and feedback;Compensatory strategies;Functional tasks;Cognitive reorganization;Compensatory techniques;Internal/external aids;Multimodal communcation approach;Patient/family education    Potential Considerations Co-morbidities;Previous level of function             Patient will benefit from skilled therapeutic intervention in order to improve the following deficits and impairments:   Aphasia  Cognitive communication deficit    Problem List Patient Active Problem List   Diagnosis Date Noted   Need for hepatitis C screening test 11/10/2020   Pneumococcal vaccination administered at current visit 11/10/2020   Healthcare maintenance 11/06/2020   Hypoglycemia [id number]   Acute metabolic encephalopathy [id number]   Acute on chronic anemia 10/22/2020   Angiodysplasia of intestine    GI bleed 10/05/2020   Melena    Upper GI bleed    Iron deficiency anemia 05/22/2020   Constipation 05/22/2020   GIB (gastrointestinal bleeding) 05/21/2020   Diabetic retinopathy associated  with type 2 diabetes mellitus ([place])    History of CVA (cerebrovascular accident) 04/23/2020   Hyperlipidemia associated with type 2 diabetes mellitus ([place]) 04/23/2020   Alcohol use 04/23/2020   AKI (acute  kidney injury) ([place]) 04/23/2020   Tobacco use 04/23/2020   Unresponsive 11/08/2018   Syncopal seizure ([place]) 11/08/2018   Hypertension associated with diabetes ([place]) 11/08/2018   Alcohol withdrawal seizure with complication, with unspecified complication ([place]) [id number]   Type 2 diabetes mellitus without complication ([place]) [id number]   Decreased hemoglobin 04/15/2018   Prostate cancer ([place]) 02/27/2014   Malignant neoplasm of prostate ([place]) 01/09/2014   Bunion 01/17/2013   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 MRN: [id number] Date of Birth: May 16, 1945

## 2020-12-03 NOTE — Therapy (Signed)
[facility] [address] [place], [place], [address] Phone: [phone]   Fax:  [phone]  Occupational Therapy Treatment  Patient Details  Name: Gregory Nunez. MRN: [id number] Date of Birth: 09-08-1945 Referring Provider (OT): Dr. Farrel Gordon   Encounter Date: 12/03/2020   OT End of Session - 12/03/20 0934     Visit Number 2    Number of Visits 21    Date for OT Re-Evaluation 02/07/21    Authorization Type [employer] MCR/MCD    Progress Note Due on Visit 10    OT Start Time 0845    OT Stop Time 0930    OT Time Calculation (min) 45 min    Activity Tolerance Patient tolerated treatment well    Behavior During Therapy WFL for tasks assessed/performed             Past Medical History:  Diagnosis Date   Cataract    OD   Depression    Diabetes mellitus without complication ([place])    Diabetic retinopathy ([place])    NPDR OU   GERD (gastroesophageal reflux disease)    if drinks alcohol   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   Patient is approximately 5-week status post bunion correction left foot   Hyperlipidemia    Hypertension    Hypertensive retinopathy    OU   Malignant neoplasm of prostate ([place]) 01/09/2014   Pancreatitis    Prostate cancer ([place]) 12/19/2013   Gleason 4+3=7, volume 31.31 cc   Shortness of breath dyspnea    with exertion     Past Surgical History:  Procedure Laterality Date   BIOPSY  04/16/2018   Procedure: BIOPSY;  Surgeon: Juanita Craver, MD;  [facility];  Service: Endoscopy;;   BIOPSY  05/23/2020   Procedure: BIOPSY;  Surgeon: Jackquline Denmark, MD;  [facility];  Service: Gastroenterology;;  EGD and COLON   biopsy on throat     hx of    CATARACT EXTRACTION Bilateral    Dr. Quentin Ore   COLONOSCOPY N/A 05/23/2020   Procedure: COLONOSCOPY;  Surgeon: Jackquline Denmark, MD;  [facility];  Service: Gastroenterology;  Laterality: N/A;   ENTEROSCOPY N/A  10/07/2020   Procedure: ENTEROSCOPY;  Surgeon: [name], MD;  [facility];  Service: Endoscopy;  Laterality: N/A;   ESOPHAGOGASTRODUODENOSCOPY Left 04/16/2018   Procedure: ESOPHAGOGASTRODUODENOSCOPY (EGD);  Surgeon: Juanita Craver, MD;  Location: [facility];  Service: Endoscopy;  Laterality: Left;   ESOPHAGOGASTRODUODENOSCOPY (EGD) WITH PROPOFOL N/A 05/23/2020   Procedure: ESOPHAGOGASTRODUODENOSCOPY (EGD) WITH PROPOFOL;  Surgeon: Jackquline Denmark, MD;  [facility];  Service: Gastroenterology;  Laterality: N/A;   EYE SURGERY     FOOT SURGERY     HOT HEMOSTASIS N/A 04/16/2018   Procedure: HOT HEMOSTASIS (ARGON PLASMA COAGULATION/BICAP);  Surgeon: Juanita Craver, MD;  Location: [facility];  Service: Endoscopy;  Laterality: N/A;   HOT HEMOSTASIS N/A 05/23/2020   Procedure: HOT HEMOSTASIS (ARGON PLASMA COAGULATION/BICAP);  Surgeon: Jackquline Denmark, MD;  Location: [facility];  Service: Gastroenterology;  Laterality: N/A;   LYMPHADENECTOMY Bilateral 02/27/2014   Procedure: BILATERAL LYMPHADENECTOMY;  Surgeon: Alexis Frock, MD;  [facility];  Service: Urology;  Laterality: Bilateral;   POLYPECTOMY  05/23/2020   Procedure: POLYPECTOMY;  Surgeon: Jackquline Denmark, MD;  [facility];  Service: Gastroenterology;;   PROSTATE BIOPSY  12/2013   Gleason 4+3=7, volume 31.31 cc   ROBOT ASSISTED LAPAROSCOPIC RADICAL PROSTATECTOMY N/A 02/27/2014   Procedure: ROBOTIC ASSISTED LAPAROSCOPIC RADICAL  PROSTATECTOMY WITH INDOCYANINE GREEN DYE;  Surgeon: Sebastian Ache, MD;  [facility]: [facility];  Service: Urology;  Laterality: N/A;    There were no vitals filed for this visit.   Subjective Assessment - 12/03/20 0852     Patient is accompanied by: Family member   niece   Pertinent History CVA x 2 (Jan and Feb 2022), recent syncopal seizure 10/05/20, DM w/ diabetic retinopathy, HTN, HLD, GERD    Limitations NO Driving, **recent seizure    Currently in Pain? No/denies              See pt instructions for coordination activities/HEP - pt with more difficulty Rt hand than Lt hand. Noted motor apraxia.   Pt also issued putty HEP for Rt hand.   Discussed tub bench recommendations with pt/family and pt provided handout and referral form, and told where to purchase                     OT Education - 12/03/20 0916     Education Details coordination HEP for bilateral hands, putty HEP for Rt hand, tub bench info and referral for MD to sign    Person(s) Educated Patient    Methods Explanation;Demonstration;Handout;Verbal cues    Comprehension Verbalized understanding;Returned demonstration;Verbal cues required;Need further instruction              OT Short Term Goals - 12/03/20 0935       OT SHORT TERM GOAL #1   Title Independent with initial HEP for bilateral coordination and Rt grip strength    Time 5    Period Weeks    Status On-going      OT SHORT TERM GOAL #2   Title Pt/family to verbalize acquisition of tub bench to prevent falls    Time 5    Period Weeks    Status Achieved      OT SHORT TERM GOAL #3   Title Pt to consistently perform snack prep and heat up microwaveable items    Time 5    Period Weeks    Status New      OT SHORT TERM GOAL #4   Title Pt to wash dishes and fold towels consistently    Time 5    Period Weeks    Status New      OT SHORT TERM GOAL #5   Title Pt to improve coordination bilaterally by 10 sec    Baseline Rt = 1 min, Lt = 47 sec    Time 5    Period Weeks    Status New               OT Long Term Goals - 11/25/20 0956       OT LONG TERM GOAL #1   Title Independent with UE HEP for strengthening    Time 10    Period Weeks    Status New      OT LONG TERM GOAL #2   Title Pt to improve Rt grip strength by 5 lbs or greater    Baseline 51 lbs (Lt = 60 lbs)    Time 10    Period Weeks    Status New      OT LONG TERM GOAL #3   Title Pt to perform simple cooking task (egg) with distant  supervision    Time 10    Period Weeks    Status New      OT LONG TERM GOAL #4  Title Pt to consistently perform light IADLS (all cleaning) at mod I level    Time 10    Period Weeks    Status New      OT LONG TERM GOAL #5   Title Pt to perform simple financial management with direct supervision    Time 10    Period Weeks    Status New                   Plan - 12/03/20 0935     Clinical Impression Statement Pt progressing towards STG #1 and met STG #2.    OT Occupational Profile and History Detailed Assessment- Review of Records and additional review of physical, cognitive, psychosocial history related to current functional performance    Occupational performance deficits (Please refer to evaluation for details): IADL's;ADL's;Social Participation    Body Structure / Function / Physical Skills Strength;Dexterity;UE functional use;IADL;Endurance;Mobility;Coordination;FMC;Vision    Cognitive Skills Problem Solve;Sequencing;Perception;Learn    Rehab Potential Good    Clinical Decision Making Several treatment options, min-mod task modification necessary    Comorbidities Affecting Occupational Performance: Presence of comorbidities impacting occupational performance    Comorbidities impacting occupational performance description: seizure, diabetic retinopathy    Modification or Assistance to Complete Evaluation  No modification of tasks or assist necessary to complete eval    OT Frequency 2x / week    OT Duration --   10 weeks (anticipate only 6-8 weeks)   OT Treatment/Interventions Self-care/ADL training;DME and/or AE instruction;Therapeutic activities;Therapeutic exercise;Cognitive remediation/compensation;Coping strategies training;Visual/perceptual remediation/compensation;Functional Mobility Training;Neuromuscular education;Manual Therapy;Patient/family education    Plan review coordination HEP, continue to work on Rt hand function and light IADLS    Consulted and Agree  with Plan of Care Patient;Family member/caregiver    Family Member Consulted niece [name])             Patient will benefit from skilled therapeutic intervention in order to improve the following deficits and impairments:   Body Structure / Function / Physical Skills: Strength, Dexterity, UE functional use, IADL, Endurance, Mobility, Coordination, FMC, Vision Cognitive Skills: Problem Solve, Sequencing, Perception, Learn     Visit Diagnosis: Other lack of coordination  Unsteadiness on feet  Muscle weakness (generalized)    Problem List Patient Active Problem List   Diagnosis Date Noted   Need for hepatitis C screening test 11/10/2020   Pneumococcal vaccination administered at current visit 11/10/2020   Healthcare maintenance 11/06/2020   Hypoglycemia 35/46/5681   Acute metabolic encephalopathy 27/51/7001   Acute on chronic anemia 10/22/2020   Angiodysplasia of intestine    GI bleed 10/05/2020   Melena    Upper GI bleed    Iron deficiency anemia 05/22/2020   Constipation 05/22/2020   GIB (gastrointestinal bleeding) 05/21/2020   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 ([place])    History of CVA (cerebrovascular accident) 04/23/2020   Hyperlipidemia associated with type 2 diabetes mellitus ([place]) 04/23/2020   Alcohol use 04/23/2020   AKI (acute kidney injury) ([place]) 04/23/2020   Tobacco use 04/23/2020   Unresponsive 11/08/2018   Syncopal seizure ([place]) 11/08/2018   Hypertension associated with diabetes ([place]) 11/08/2018   Alcohol withdrawal seizure with complication, with unspecified complication ([place]) 74/94/4967   Type 2 diabetes mellitus without complication ([place]) 59/16/3846   Decreased hemoglobin 04/15/2018   Prostate cancer ([place]) 02/27/2014   Malignant neoplasm of prostate ([place]) 01/09/2014   Bunion 01/17/2013   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 MRN: [id number] Date of Birth: 1946/02/02

## 2020-12-05 LAB — HM DIABETES EYE EXAM

## 2020-12-08 ENCOUNTER — Other Ambulatory Visit: Payer: Self-pay

## 2020-12-08 ENCOUNTER — Ambulatory Visit: Payer: Medicare Other | Admitting: Physical Therapy

## 2020-12-08 ENCOUNTER — Encounter: Payer: Self-pay | Admitting: Internal Medicine

## 2020-12-08 ENCOUNTER — Observation Stay (HOSPITAL_COMMUNITY)
Admission: RE | Admit: 2020-12-08 | Discharge: 2020-12-10 | Disposition: A | Discharge status: Home / Self Care | Payer: Medicare Other | Attending: Internal Medicine | Admitting: Internal Medicine

## 2020-12-08 ENCOUNTER — Ambulatory Visit: Payer: Medicare Other | Admitting: Speech Pathology

## 2020-12-08 ENCOUNTER — Encounter: Payer: Self-pay | Admitting: Physical Therapy

## 2020-12-08 ENCOUNTER — Telehealth: Payer: Self-pay | Admitting: *Deleted

## 2020-12-08 ENCOUNTER — Ambulatory Visit (INDEPENDENT_AMBULATORY_CARE_PROVIDER_SITE_OTHER): Payer: Medicare Other | Admitting: Internal Medicine

## 2020-12-08 VITALS — BP 110/53 | HR 69 | Temp 98.1°F | Ht 71.0 in | Wt 171.4 lb

## 2020-12-08 DIAGNOSIS — Z7982 Long term (current) use of aspirin: Secondary | ICD-10-CM | POA: Diagnosis not present

## 2020-12-08 DIAGNOSIS — I10 Essential (primary) hypertension: Secondary | ICD-10-CM | POA: Diagnosis not present

## 2020-12-08 DIAGNOSIS — Z79899 Other long term (current) drug therapy: Secondary | ICD-10-CM | POA: Diagnosis not present

## 2020-12-08 DIAGNOSIS — K31811 Angiodysplasia of stomach and duodenum with bleeding: Secondary | ICD-10-CM | POA: Diagnosis not present

## 2020-12-08 DIAGNOSIS — E1159 Type 2 diabetes mellitus with other circulatory complications: Secondary | ICD-10-CM

## 2020-12-08 DIAGNOSIS — Z8673 Personal history of transient ischemic attack (TIA), and cerebral infarction without residual deficits: Secondary | ICD-10-CM | POA: Insufficient documentation

## 2020-12-08 DIAGNOSIS — Z1152 Encounter for screening for COVID-19: Secondary | ICD-10-CM

## 2020-12-08 DIAGNOSIS — D132 Benign neoplasm of duodenum: Secondary | ICD-10-CM

## 2020-12-08 DIAGNOSIS — R58 Hemorrhage, not elsewhere classified: Secondary | ICD-10-CM | POA: Diagnosis not present

## 2020-12-08 DIAGNOSIS — K922 Gastrointestinal hemorrhage, unspecified: Secondary | ICD-10-CM | POA: Diagnosis not present

## 2020-12-08 DIAGNOSIS — Z7984 Long term (current) use of oral hypoglycemic drugs: Secondary | ICD-10-CM | POA: Diagnosis not present

## 2020-12-08 DIAGNOSIS — D649 Anemia, unspecified: Secondary | ICD-10-CM

## 2020-12-08 DIAGNOSIS — E119 Type 2 diabetes mellitus without complications: Secondary | ICD-10-CM | POA: Insufficient documentation

## 2020-12-08 DIAGNOSIS — K317 Polyp of stomach and duodenum: Secondary | ICD-10-CM | POA: Insufficient documentation

## 2020-12-08 DIAGNOSIS — K921 Melena: Secondary | ICD-10-CM

## 2020-12-08 DIAGNOSIS — Z8546 Personal history of malignant neoplasm of prostate: Secondary | ICD-10-CM | POA: Insufficient documentation

## 2020-12-08 DIAGNOSIS — Z9181 History of falling: Secondary | ICD-10-CM | POA: Diagnosis not present

## 2020-12-08 DIAGNOSIS — I152 Hypertension secondary to endocrine disorders: Secondary | ICD-10-CM

## 2020-12-08 DIAGNOSIS — R531 Weakness: Secondary | ICD-10-CM | POA: Diagnosis present

## 2020-12-08 DIAGNOSIS — R2681 Unsteadiness on feet: Secondary | ICD-10-CM | POA: Diagnosis not present

## 2020-12-08 DIAGNOSIS — R29898 Other symptoms and signs involving the musculoskeletal system: Secondary | ICD-10-CM

## 2020-12-08 LAB — CBC WITH DIFFERENTIAL/PLATELET
Abs Immature Granulocytes: 0.01 10*3/uL (ref 0.00–0.07)
Basophils Absolute: 0 10*3/uL (ref 0.0–0.1)
Basophils Relative: 0 %
Eosinophils Absolute: 0 10*3/uL (ref 0.0–0.5)
Eosinophils Relative: 1 %
HCT: 15.6 % — ABNORMAL LOW (ref 39.0–52.0)
Hemoglobin: 4.9 g/dL — CL (ref 13.0–17.0)
Immature Granulocytes: 0 %
Lymphocytes Relative: 23 %
Lymphs Abs: 0.8 10*3/uL (ref 0.7–4.0)
MCH: 30.1 pg (ref 26.0–34.0)
MCHC: 31.4 g/dL (ref 30.0–36.0)
MCV: 95.7 fL (ref 80.0–100.0)
Monocytes Absolute: 0.4 10*3/uL (ref 0.1–1.0)
Monocytes Relative: 13 %
Neutro Abs: 2.1 10*3/uL (ref 1.7–7.7)
Neutrophils Relative %: 63 %
Platelets: 130 10*3/uL — ABNORMAL LOW (ref 150–400)
RBC: 1.63 MIL/uL — ABNORMAL LOW (ref 4.22–5.81)
RDW: 15.7 % — ABNORMAL HIGH (ref 11.5–15.5)
WBC: 3.3 10*3/uL — ABNORMAL LOW (ref 4.0–10.5)
nRBC: 0 % (ref 0.0–0.2)

## 2020-12-08 LAB — APTT: aPTT: 34 seconds (ref 24–36)

## 2020-12-08 LAB — IRON AND TIBC
Iron: 21 ug/dL — ABNORMAL LOW (ref 45–182)
Saturation Ratios: 6 % — ABNORMAL LOW (ref 17.9–39.5)
TIBC: 378 ug/dL (ref 250–450)
UIBC: 357 ug/dL

## 2020-12-08 LAB — BASIC METABOLIC PANEL
Anion gap: 8 (ref 5–15)
BUN: 33 mg/dL — ABNORMAL HIGH (ref 8–23)
CO2: 21 mmol/L — ABNORMAL LOW (ref 22–32)
Calcium: 8.4 mg/dL — ABNORMAL LOW (ref 8.9–10.3)
Chloride: 109 mmol/L (ref 98–111)
Creatinine, Ser: 1.91 mg/dL — ABNORMAL HIGH (ref 0.61–1.24)
GFR, Estimated: 37 mL/min — ABNORMAL LOW (ref >60)
Glucose, Bld: 267 mg/dL — ABNORMAL HIGH (ref 70–99)
Potassium: 4.2 mmol/L (ref 3.5–5.1)
Sodium: 138 mmol/L (ref 135–145)

## 2020-12-08 LAB — RETICULOCYTES
Immature Retic Fract: 22.8 % — ABNORMAL HIGH (ref 2.3–15.9)
RBC.: 1.52 MIL/uL — ABNORMAL LOW (ref 4.22–5.81)
Retic Count, Absolute: 70.5 10*3/uL (ref 19.0–186.0)
Retic Ct Pct: 4.6 % — ABNORMAL HIGH (ref 0.4–3.1)

## 2020-12-08 LAB — VITAMIN B12: Vitamin B-12: 727 pg/mL (ref 180–914)

## 2020-12-08 LAB — FERRITIN: Ferritin: 8 ng/mL — ABNORMAL LOW (ref 24–336)

## 2020-12-08 LAB — PROTIME-INR
INR: 1.1 (ref 0.8–1.2)
Prothrombin Time: 14.5 seconds (ref 11.4–15.2)

## 2020-12-08 LAB — GLUCOSE, CAPILLARY
Glucose-Capillary: 229 mg/dL — ABNORMAL HIGH (ref 70–99)
Glucose-Capillary: 123 mg/dL — ABNORMAL HIGH (ref 70–99)

## 2020-12-08 LAB — PREPARE RBC (CROSSMATCH)

## 2020-12-08 LAB — SARS CORONAVIRUS 2 (TAT 6-24 HRS): SARS Coronavirus 2: NEGATIVE

## 2020-12-08 MED ORDER — ADULT MULTIVITAMIN W/MINERALS CH
1.0000 | ORAL_TABLET | Freq: Every day | ORAL | Status: DC
  Administered 2020-12-08 – 2020-12-10 (×3): 1 via ORAL
  Filled 2020-12-08 (×3): qty 1

## 2020-12-08 MED ORDER — INSULIN ASPART 100 UNIT/ML IJ SOLN: 0.0000 [IU] | Freq: Every day | INTRAMUSCULAR | Status: DC

## 2020-12-08 MED ORDER — THIAMINE HCL 100 MG PO TABS
100.0000 mg | ORAL_TABLET | Freq: Every day | ORAL | Status: DC
  Administered 2020-12-08 – 2020-12-10 (×3): 100 mg via ORAL
  Filled 2020-12-08 (×3): qty 1

## 2020-12-08 MED ORDER — FOLIC ACID 1 MG PO TABS
1.0000 mg | ORAL_TABLET | Freq: Every day | ORAL | Status: DC
  Administered 2020-12-08 – 2020-12-10 (×3): 1 mg via ORAL
  Filled 2020-12-08 (×3): qty 1

## 2020-12-08 MED ORDER — INSULIN ASPART 100 UNIT/ML IJ SOLN
0.0000 [IU] | Freq: Three times a day (TID) | INTRAMUSCULAR | Status: DC
  Administered 2020-12-08: 5 [IU] via SUBCUTANEOUS
  Administered 2020-12-09 (×2): 2 [IU] via SUBCUTANEOUS
  Administered 2020-12-10 (×2): 3 [IU] via SUBCUTANEOUS

## 2020-12-08 MED ORDER — LACTATED RINGERS IV BOLUS
1000.0000 mL | Freq: Once | INTRAVENOUS | Status: AC
  Administered 2020-12-08: 1000 mL via INTRAVENOUS

## 2020-12-08 MED ORDER — PANTOPRAZOLE SODIUM 40 MG PO TBEC
40.0000 mg | DELAYED_RELEASE_TABLET | Freq: Every day | ORAL | Status: DC
  Administered 2020-12-09 – 2020-12-10 (×2): 40 mg via ORAL
  Filled 2020-12-08 (×2): qty 1

## 2020-12-08 MED ORDER — LEVETIRACETAM 500 MG PO TABS
500.0000 mg | ORAL_TABLET | Freq: Two times a day (BID) | ORAL | Status: DC
  Administered 2020-12-08 – 2020-12-10 (×4): 500 mg via ORAL
  Filled 2020-12-08 (×4): qty 1

## 2020-12-08 MED ORDER — SODIUM CHLORIDE 0.9% IV SOLUTION: Freq: Once | INTRAVENOUS | Status: AC

## 2020-12-08 MED ORDER — ATORVASTATIN CALCIUM 40 MG PO TABS
40.0000 mg | ORAL_TABLET | Freq: Every day | ORAL | Status: DC
  Administered 2020-12-09 – 2020-12-10 (×2): 40 mg via ORAL
  Filled 2020-12-08 (×2): qty 1

## 2020-12-08 MED ORDER — NICOTINE 14 MG/24HR TD PT24
14.0000 mg | MEDICATED_PATCH | Freq: Every day | TRANSDERMAL | Status: DC
  Administered 2020-12-08 – 2020-12-10 (×3): 14 mg via TRANSDERMAL
  Filled 2020-12-08 (×3): qty 1

## 2020-12-08 NOTE — H&P (Addendum)
Date: 12/08/2020               Patient Name:  Gregory Nunez. MRN: [id number]  DOB: January 13, 1946 Age / Sex: 75 y.o., male   PCP: Farrel Gordon, DO         Medical Service: Internal Medicine Teaching Service         Attending Physician: Dr. Sid Falcon, MD    First Contact: Shea Evans, MS3 Pager: [phone]  Second Contact: Dr. Raymondo Band Pager: [phone]       After Hours (After 5p/  First Contact Pager: [phone]  weekends / holidays): Second Contact Pager: [phone]   Chief Complaint: lower extremity weakness  History of Present Illness: 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 The [facility] was [facility] today when he nearly fell in the parking lot. The patient was unsteady on his feet and felt weak. He did not lose consciousness and did not sustain any injuries. His blood pressure was noted to be low at this time, with SBP in the 90s. The patient was then directed to go to his PCP today and was seen at the IM clinic by Dr. Laural Golden. In the clinic, the patient was noted to have an unsteady gait and was still feeling weak in his lower extremities. Orthostatic blood pressures were positive, with SBP in the 90s-100s. Of note, the patient takes amlodipine 10 mg, losartan '100mg'$ , and carvedilol 25 mg for blood pressure, and last took these meds this morning. He denies any fevers, chills, chest pain, shortness of breath, abd pain, n/v/d, constipation, dysuria, hematuria, or any other sxs at this time. He does note that he occasionally has episodes of dark, tarry stools, but he is unable to quantify them and does not know how long this has been going on. He denies any recent NSAID use, aside from aspirin, and is not on any anticoagulation. The patient last had an egd/colonoscopy in February.  Colonoscopy significant for multiple small polyps which were removed and EGD showed multiple AVMs s/p APC. Patient then had a repeat EGD in June which was stable.   CBC in the clinic was significant for Hb of 4.9, WBC 3.3, and platelets 130. BMP significant for Cr of 1.91, with baseline around 1.5. Glucose also elevated at 267. BP was 90s/40s in the clinic.   Meds:  Amlodipine '10mg'$  Aspirin '81mg'$  Lipitor '40mg'$  Carvedilol '25mg'$  Keppra '500mg'$  BID Losartan '100mg'$   Metformin '500mg'$  BID Protonix '40mg'$  Current Meds  Medication Sig   amLODipine (NORVASC) 10 MG tablet Take 1 tablet (10 mg total) by mouth daily.   aspirin EC 81 MG EC tablet Take 1 tablet (81 mg total) by mouth daily. Swallow whole.   atorvastatin (LIPITOR) 40 MG tablet Take 1 tablet (40 mg total) by mouth daily.   carvedilol (COREG) 25 MG tablet Take 1 tablet (25 mg total) by mouth daily.   losartan (COZAAR) 100 MG tablet Take 100 mg by mouth daily.   metFORMIN (GLUCOPHAGE) 500 MG tablet Take 500 mg by mouth 2 (two) times daily.   pantoprazole (PROTONIX) 40 MG tablet Take 1 tablet (40 mg total) by mouth daily.     Allergies: Allergies as of 12/08/2020 - Review Complete 12/08/2020  Allergen Reaction Noted   Penicillins Anxiety and Other (See Comments) 05/16/2011  Past Medical History:  Diagnosis Date   Cataract    OD   Depression    Diabetes mellitus without complication (HCC)    Diabetic retinopathy ([place])    NPDR OU   GERD (gastroesophageal reflux disease)    if drinks alcohol   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   Patient is approximately 5-week status post bunion correction left foot   Hyperlipidemia    Hypertension    Hypertensive retinopathy    OU   Malignant neoplasm of prostate ([place]) 01/09/2014   Pancreatitis    Prostate cancer ([place]) 12/19/2013   Gleason 4+3=7, volume 31.31 cc   Shortness of breath dyspnea    with exertion     Family History: heart disease  Social History: Lives in [place] with  his sister; mostly independent of his ADLs but does get some assistance from his sister. Extensive alcohol use history, last drink was Saturday. Smokes 0.5-1 pack per day for 20+ years  Review of Systems: A complete ROS was negative except as per HPI.   Physical Exam: There were no vitals taken for this visit. Physical Exam Constitutional:      General: He is not in acute distress.    Appearance: Normal appearance.  Eyes:     Extraocular Movements: Extraocular movements intact.     Pupils: Pupils are equal, round, and reactive to light.  Cardiovascular:     Rate and Rhythm: Normal rate and regular rhythm.     Pulses: Normal pulses.     Heart sounds: Normal heart sounds. No murmur heard.   No friction rub. No gallop.  Pulmonary:     Effort: Pulmonary effort is normal. No respiratory distress.     Breath sounds: Normal breath sounds. No wheezing, rhonchi or rales.  Abdominal:     General: Bowel sounds are normal. There is no distension.     Tenderness: There is no abdominal tenderness.     Comments: Abdomen firm on palpation   Musculoskeletal:     Right lower leg: No edema.     Left lower leg: No edema.  Skin:    General: Skin is warm and dry.  Neurological:     General: No focal deficit present.     Mental Status: He is alert and oriented to person, place, and time. Mental status is at baseline.     Comments: 5/5 strength in bilateral upper and lower extremities, although patient has reported residual right sided deficits +Dysarthria  Psychiatric:        Mood and Affect: Mood normal.        Behavior: Behavior normal.    Assessment & Plan by Problem: Principal Problem:   Symptomatic anemia  #Symptomatic anemia #Acute on chronic anemia Patient presented with lower extremity weakness that began today.  SBP has been in the 90s and he has been orthostatic positive when evaluated in the clinic. Hb in the clinic was found to be 4.9. Had EGD in June 2022 which was stable and  showed no active signs of bleeding. 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 The patient does endorse some melena at this time, but is unable to quantify this at this time. Also denies any recent NSAID use, aside from baby aspirin, and denies any anticoagulation use. Patient will be transfused with 2  units of PRBCs and will consult GI to evaluate the source of this possible bleed.  - Consulted GI, appreciate recommendations - Check reticulocyte count - Continue to monitor Hb; transfuse if <7 - Protonix '40mg'$  daily   #AKI on CKD IIIa Patient Cr elevated to 1.9 today, up from his baseline of around 1.5. Likely prerenal in nature and secondary to GI bleed. Patient receiving 2 units of PRBCs currently. - Monitor renal function - Avoid nephrotoxic agents  #Orthostatic Hypotension Positive for orthostatics in the clinic likely secondary to symptomatic anemia, as above. Will repeat orthostatics in the morning after the patient is transfused with 2 units of PRBCs and 1 L of LR. 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 The patient did not walk, but notes that he still feels unsteady on his feet. - Holding aspirin in the setting of possible GI bleed - Continue Lipitor '40mg'$  - Check B12 level  #Hypertension Patient on losartan, coreg, and amlodipine at home for high blood pressure. He last took these medications this morning, however, his blood pressure has been low since being admitted. - Holding home medications in the setting of hypotension  #Alcohol use disorder  Patient currently takes keppra '500mg'$  daily ever since he had a  syncopal seizure in June 2022. This seizure was attributed to alcohol withdrawal at the time. The patient states that his last drink was on Saturday, although the patient states that he has been cutting back on his alcohol use recently. - CIWA protocol  - Continue home keppra  #Tobacco use disorder Patient smokes about 0.5-1 packs per day for 20+ years. He states that he has nicotine patches at home, but does not always use them.  - Nicotine patches ordered  #Type 2 Diabetes A1c 6.2 in July. Takes metformin '500mg'$  BID at home.  - Hold metformin in the setting of AKI - SSI  #Hepatitis C Hep C antibody positive in July 2022 with RNA quant 40,000. Patient was referred to GI in the outpatient setting by his PCP.    Dispo: Admit patient to Observation with expected length of stay less than 2 midnights.  SignedDorethea Clan, DO 12/08/2020, 2:43 PM  Pager: '@[email or web address]'$ @ After 5pm on weekdays and 1pm on weekends: On Call pager: [phone]

## 2020-12-08 NOTE — Plan of Care (Signed)
  Problem: Clinical Measurements: Goal: Ability to maintain clinical measurements within normal limits will improve Outcome: Progressing   Problem: Safety: Goal: Ability to remain free from injury will improve Outcome: Progressing   

## 2020-12-08 NOTE — Assessment & Plan Note (Signed)
Patient presents today with bilateral lower extremity weakness and hypotension noted at neuro rehab.  He states he was getting out of his car when he suddenly felt very weak in his legs and they gave out.  He denies any falls or hitting his head.  But does state this has happened a few times since his most recent hospitalization.  He denies any lightheadedness, dizziness, shortness of breath, upper extremity weakness, changes in his vision, headaches, drooling.  He denies any changes to his medication or diet.  States that he drinks a lot of fluid daily.  States his urine is usually clear in color.  Initially denied any blood in his stool however then reported sometimes he notices some red blood in his stool.  On exam he demonstrated significantly unsteady gait.  Orthostatic vitals were positive.  Discussed IV fluids versus oral, patient wanted to try drinking fluids orally and have his orthostatics rechecked.  While he was replenishing his fluids his CBC resulted with a hemoglobin of 4.9.  He was recently hospitalized in June due to a syncopal episode found to have iron deficiency anemia and transfused 2 units PRBCs.  Endoscopy at that time was unremarkable.  His syncopal episode was not really felt to be due to his blood loss.  Patient does has history of gastric and duodenal AVMs with prior work-up February 2022 due to anemia and heme positive stool.  The AVMs were treated with APC. Patient had colonoscopy at that same time with removal of 5 polyps which were tubular adenomas and hyperplastic polyps.   Plan: Admit for symptomatic anemia and will need a GI consult Would recommend holding all blood pressure medications.  Once discharged he can follow-up closely outpatient to restart and titrate his blood pressure medications

## 2020-12-08 NOTE — Consult Note (Signed)
Referring Provider:  [facility]         Primary Care Physician:  Farrel Gordon, DO Primary Gastroenterologist:   Bonfield Cellar, MD Reason for Consultation:     anemia              ASSESSMENT / PLAN    # 75 yo male with recurrent and profound anemia ( acute on chronic). 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 He reports dark stools at home but ? Takes iron. Chronic anemia may be due in part to CKD. Current decline in hgb possibly secondary to gastric / small bowel AVMs.  --Will likely need EGD or enteroscopy in am. The risks and benefits of EGD with possible biopsies were discussed with the patient who agrees to proceed.  --Check iron studies, B12 level --Two uPRBCs has been ordered.  --am CBC --Pantoprazole 40 mg daily already ordered  # History of multiple and recurrent colon polyps ( SSA and TA). Polyp surveillance colonoscopy due Feb 2025   HISTORY OF PRESENT ILLNESS                                                                                                                         Chief Complaint: anemia  Gregory Holk. is a 75 y.o. 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 Havery Moros for history of colon polyps first found on screening colonoscopy in 2018. Following that he was seen in hospital in late 2019 and again in February 2022 for IDA , heme positive stools and findings of upper Gi tract AVMs, chronic atrophic gastritis  / intestinal metaplasia on EGD with biopsies. Repeat colonoscopy during Feb 2022 admission remarkable for more colon polyps.    Patient has a history of a CVA and has been undergoing PT, OT and ST. He has some difficulty providing history . Patient  says [facility] when he nearly [facility]. He was sent to ED where his hgb was 4.9, down from 8.5 in late July. He says he has been having black stools at home for two weeks. Patient is not sure whether he takes an iron supplement though I see one on home med list. He lives with his sister who prepares his medications. He does deny NSAID use but baby asa on home med list. Protonix on home med list. He hasn't been having any abdominal pain, nausea / vomiting. He hasn't felt particularly short of breath .   PREVIOUS ENDOSCOPIC EVALUATIONS    April 2018 screening colonoscopy  -multiple polyps up to 6 mm. Path - TA and SSA  Dec 2019 EGD for IDA -Duodenal bulb AVM, non-bleeding. Ablated. Diffuse gastritis with erosions. Path - chronic gastritis and intestinal metaplasia c/w chronic atrophic  gastritis  Feb 2022 EGD for heme + anemia -non-bleeding AVM in body of stomach, ablated. Five 2-4 mm AVMs in D2 (ablated), a larger AVM with oozing in D3 was ablated. Path - chronic gastritis, reactive changes. No H.pylori.   Feb 2022 colonoscopy with TI intubation to 2 cm for heme + anemia -Adequate bowel prep. Multiple polyps up to 6 mm. Small ascending colon lipoma. Polyp path - hyperplastic polyps, tubular adenomas    Past Medical History:  Diagnosis Date   Cataract    OD   Depression    Diabetes mellitus without complication (HCC)    Diabetic retinopathy ([place])    NPDR OU   GERD (gastroesophageal reflux disease)    if drinks alcohol   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   Patient is approximately 5-week status post bunion correction left foot   Hyperlipidemia    Hypertension    Hypertensive retinopathy    OU   Malignant neoplasm of prostate ([place]) 01/09/2014   Pancreatitis    Prostate cancer ([place]) 12/19/2013   Gleason 4+3=7, volume 31.31 cc   Shortness of breath dyspnea    with exertion     Past Surgical History:  Procedure Laterality Date   BIOPSY  04/16/2018   Procedure:  BIOPSY;  Surgeon: Juanita Craver, MD;  Location: [facility] END[facility];  Service: Endoscopy;;   BIOPSY  05/23/2020   Procedure: BIOPSY;  Surgeon: Jackquline Denmark, MD;  Location: [facility];  Service: Gastroenterology;;  EGD and COLON   biopsy on throat     hx of    CATARACT EXTRACTION Bilateral    Dr. Quentin Ore   COLONOSCOPY N/A 05/23/2020   Procedure: COLONOSCOPY;  Surgeon: Jackquline Denmark, MD;  Location: [facility];  Service: Gastroenterology;  Laterality: N/A;   ENTEROSCOPY N/A 10/07/2020   Procedure: ENTEROSCOPY;  Surgeon: [name], MD;  Location: [facility] ENDOSCOPY;  Service: Endoscopy;  Laterality: N/A;   ESOPHAGOGASTRODUODENOSCOPY Left 04/16/2018   Procedure: ESOPHAGOGASTRODUODENOSCOPY (EGD);  Surgeon: Juanita Craver, MD;  Location: [facility];  Service: Endoscopy;  Laterality: Left;   ESOPHAGOGASTRODUODENOSCOPY (EGD) WITH PROPOFOL N/A 05/23/2020   Procedure: ESOPHAGOGASTRODUODENOSCOPY (EGD) WITH PROPOFOL;  Surgeon: Jackquline Denmark, MD;  Location: [facility];  Service: Gastroenterology;  Laterality: N/A;   EYE SURGERY     FOOT SURGERY     HOT HEMOSTASIS N/A 04/16/2018   Procedure: HOT HEMOSTASIS (ARGON PLASMA COAGULATION/BICAP);  Surgeon: Juanita Craver, MD;  Location: [facility];  Service: Endoscopy;  Laterality: N/A;   HOT HEMOSTASIS N/A 05/23/2020   Procedure: HOT HEMOSTASIS (ARGON PLASMA COAGULATION/BICAP);  Surgeon: Jackquline Denmark, MD;  Location: [facility];  Service: Gastroenterology;  Laterality: N/A;   LYMPHADENECTOMY Bilateral 02/27/2014   Procedure: BILATERAL LYMPHADENECTOMY;  Surgeon: Alexis Frock, MD;  Location: [facility];  Service: Urology;  Laterality: Bilateral;   POLYPECTOMY  05/23/2020   Procedure: POLYPECTOMY;  Surgeon: Jackquline Denmark, MD;  Location: [facility];  Service: Gastroenterology;;   PROSTATE BIOPSY  12/2013   Gleason 4+3=7, volume 31.31 cc   ROBOT ASSISTED LAPAROSCOPIC RADICAL PROSTATECTOMY N/A 02/27/2014   Procedure: ROBOTIC ASSISTED LAPAROSCOPIC RADICAL PROSTATECTOMY  WITH INDOCYANINE GREEN DYE;  Surgeon: Alexis Frock, MD;  Location: [facility];  Service: Urology;  Laterality: N/A;    Prior to Admission medications   Medication Sig Start Date End Date Taking? Authorizing Provider  amLODipine (NORVASC) 10 MG tablet Take 1 tablet (10 mg total) by mouth daily. 11/06/20  Yes Farrel Gordon, DO  aspirin EC 81 MG EC tablet Take 1 tablet (81 mg total) by  mouth daily. Swallow whole. 10/24/20 10/24/21 Yes Jennye Boroughs, MD  atorvastatin (LIPITOR) 40 MG tablet Take 1 tablet (40 mg total) by mouth daily. 10/08/20 01/06/21 Yes Annita Brod, MD  carvedilol (COREG) 25 MG tablet Take 1 tablet (25 mg total) by mouth daily. 10/24/20  Yes Jennye Boroughs, MD  levETIRAcetam (KEPPRA) 500 MG tablet Take 1 tablet (500 mg total) by mouth 2 (two) times daily. 10/08/20  Yes Annita Brod, MD  losartan (COZAAR) 100 MG tablet Take 100 mg by mouth daily.   Yes [provider]  metFORMIN (GLUCOPHAGE) 500 MG tablet Take 500 mg by mouth 2 (two) times daily. 02/13/19  Yes [provider]  pantoprazole (PROTONIX) 40 MG tablet Take 1 tablet (40 mg total) by mouth daily. 11/06/20  Yes Farrel Gordon, DO  iron polysaccharides (NU-IRON) 150 MG capsule Take 1 capsule (150 mg total) by mouth daily. Patient not taking: Reported on 12/08/2020 10/24/20   Jennye Boroughs, MD  nicotine (NICODERM CQ - DOSED IN MG/24 HOURS) 14 mg/24hr patch Place 1 patch (14 mg total) onto the skin daily. Patient not taking: Reported on 12/08/2020 11/06/20 12/18/20  Farrel Gordon, DO  COMBIVENT RESPIMAT 20-100 MCG/ACT AERS respimat Inhale 1 puff into the lungs every 6 (six) hours as needed for shortness of breath. 06/12/18 08/19/19  [provider]  ferrous sulfate 325 (65 FE) MG tablet Take 1 tablet (325 mg total) by mouth 2 (two) times daily with a meal. Patient not taking: Reported on 08/19/2019 04/17/18 08/19/19  Dana Allan I, MD  mometasone-formoterol (DULERA) 100-5 MCG/ACT AERO Inhale 2 puffs into the lungs  daily. Patient not taking: Reported on 08/19/2019 04/17/18 08/19/19  Dana Allan I, MD  sucralfate (CARAFATE) 1 g tablet Take 1 tablet (1 g total) by mouth 4 (four) times daily. Patient not taking: Reported on 08/19/2019 04/17/18 08/19/19  Dana Allan I, MD  tiotropium (SPIRIVA HANDIHALER) 18 MCG inhalation capsule Place 1 capsule (18 mcg total) into inhaler and inhale daily. Patient not taking: Reported on 08/19/2019 04/17/18 08/19/19  Dana Allan I, MD  traZODone (DESYREL) 50 MG tablet Take 50 mg by mouth at bedtime as needed for sleep. Patient not taking: Reported on 10/06/2020  10/08/20  [provider]    Current Facility-Administered Medications  Medication Dose Route Frequency Provider Last Rate Last Admin   0.9 %  sodium chloride infusion (Manually program via Guardrails IV Fluids)   Intravenous Once Virl Axe, MD       Derrill Memo ON 12/09/2020] atorvastatin (LIPITOR) tablet 40 mg  40 mg Oral Daily Virl Axe, MD       folic acid (FOLVITE) tablet 1 mg  1 mg Oral Daily Virl Axe, MD   1 mg at 12/08/20 1542   insulin aspart (novoLOG) injection 0-15 Units  0-15 Units Subcutaneous TID WC Virl Axe, MD       insulin aspart (novoLOG) injection 0-5 Units  0-5 Units Subcutaneous QHS Virl Axe, MD       levETIRAcetam (KEPPRA) tablet 500 mg  500 mg Oral BID Virl Axe, MD       multivitamin with minerals tablet 1 tablet  1 tablet Oral Daily Virl Axe, MD   1 tablet at 12/08/20 1542   nicotine (NICODERM CQ - dosed in mg/24 hours) patch 14 mg  14 mg Transdermal Daily Virl Axe, MD   14 mg at 12/08/20 1542   [START ON 12/09/2020] pantoprazole (PROTONIX) EC tablet 40 mg  40 mg Oral Daily Virl Axe, MD  thiamine tablet 100 mg  100 mg Oral Daily Virl Axe, MD   100 mg at 12/08/20 1542    Allergies as of 12/08/2020 - Review Complete 12/08/2020  Allergen Reaction Noted   Penicillins Anxiety and Other (See Comments) 05/16/2011    Family  History  Problem Relation Age of Onset   Heart disease Mother    Cancer Sister        breast   Colon cancer Neg Hx    Esophageal cancer Neg Hx    Rectal cancer Neg Hx    Stomach cancer Neg Hx     Social History   Socioeconomic History   Marital status: Married    Spouse name: Not on file   Number of children: Not on file   Years of education: Not on file   Highest education level: Not on file  Occupational History   Not on file  Tobacco Use   Smoking status: Every Day    Packs/day: 1.00    Types: Cigarettes   Smokeless tobacco: Never   Tobacco comments:    1 ppd Wants Patches   Vaping Use   Vaping Use: Never used  Substance and Sexual Activity   Alcohol use: Not Currently    Alcohol/week: 1.0 standard drink    Types: 1 Cans of beer per week   Drug use: Not Currently    Types: Marijuana, Cocaine    Comment: past hx approx 30 years ago    Sexual activity: Not on file  Other Topics Concern   Not on file  Social History Narrative   ** Merged History Encounter **       Social Determinants of Health   Financial Resource Strain: Not on file  Food Insecurity: Not on file  Transportation Needs: Not on file  Physical Activity: Not on file  Stress: Not on file  Social Connections: Not on file  Intimate Partner Violence: Not on file    Review of Systems: All systems reviewed and negative except where noted in HPI.  OBJECTIVE    Physical Exam: Vital signs in last 24 hours: Temp:  [98.1 F (36.7 C)] 98.1 F (36.7 C) (08/22 0946) Pulse Rate:  [69-76] 71 (08/22 1426) Resp:  [16] 16 (08/22 1426) BP: (99-113)/(42-61) 111/61 (08/22 1426) SpO2:  [100 %] 100 % (08/22 1426) Weight:  [77.7 kg-84.6 kg] 84.6 kg (08/22 1600) Last BM Date:  ([id number]) General:   Alert  male in NAD Psych:  Pleasant, cooperative. Normal mood and affect. Eyes:  Pupils equal, sclera clear, no icterus.    Ears:  Normal auditory acuity. Nose:  No deformity, discharge,  or lesions. Neck:   Supple; no masses Lungs:  Clear throughout to auscultation.   No wheezes, crackles, or rhonchi.  Heart:  Regular rate and rhythm;  no lower extremity edema Abdomen:  Soft, non-distended, nontender, BS active, no palp mass   Rectal:  Deferred  Msk:  Symmetrical without gross deformities. . Neurologic:  Alert and  oriented. He does have some difficulty getting his words out  Skin:  Intact without significant lesions or rashes.   Scheduled inpatient medications  sodium chloride   Intravenous Once   [START ON 12/09/2020] atorvastatin  40 mg Oral Daily   folic acid  1 mg Oral Daily   insulin aspart  0-15 Units Subcutaneous TID WC   insulin aspart  0-5 Units Subcutaneous QHS   levETIRAcetam  500 mg Oral BID   multivitamin with minerals  1 tablet Oral Daily  nicotine  14 mg Transdermal Daily   [START ON 12/09/2020] pantoprazole  40 mg Oral Daily   thiamine  100 mg Oral Daily      Intake/Output from previous day: No intake/output data recorded. Intake/Output this shift: Total I/O In: 106.5 [IV Piggyback:106.5] Out: -    Lab Results: Recent Labs    12/08/20 1148  WBC 3.3*  HGB 4.9*  HCT 15.6*  PLT 130*   BMET Recent Labs    12/08/20 1148  NA 138  K 4.2  CL 109  CO2 21*  GLUCOSE 267*  BUN 33*  CREATININE 1.91*  CALCIUM 8.4*   LFT No results for input(s): PROT, ALBUMIN, AST, ALT, ALKPHOS, BILITOT, BILIDIR, IBILI in the last 72 hours. PT/INR No results for input(s): LABPROT, INR in the last 72 hours. Hepatitis Panel No results for input(s): HEPBSAG, HCVAB, HEPAIGM, HEPBIGM in the last 72 hours.   . CBC Latest Ref Rng & Units 12/08/2020 11/06/2020 10/23/2020  WBC 4.0 - 10.5 K/uL 3.3(L) 4.7 4.6  Hemoglobin 13.0 - 17.0 g/dL 4.9(LL) 8.5(L) 11.3(L)  Hematocrit 39.0 - 52.0 % 15.6(L) 25.7(L) 35.2(L)  Platelets 150 - 400 K/uL 130(L) 151 178    . CMP Latest Ref Rng & Units 12/08/2020 10/24/2020 10/22/2020  Glucose 70 - 99 mg/dL 267(H) 94 113(H)  BUN 8 - 23 mg/dL 33(H) 22 21   Creatinine 0.61 - 1.24 mg/dL 1.91(H) 1.64(H) 1.68(H)  Sodium 135 - 145 mmol/L 138 142 139  Potassium 3.5 - 5.1 mmol/L 4.2 3.8 4.1  Chloride 98 - 111 mmol/L 109 112(H) 110  CO2 22 - 32 mmol/L 21(L) 24 22  Calcium 8.9 - 10.3 mg/dL 8.4(L) 8.7(L) 8.6(L)  Total Protein 6.5 - 8.1 g/dL - - 7.8  Total Bilirubin 0.3 - 1.2 mg/dL - - 0.6  Alkaline Phos 38 - 126 U/L - - 57  AST 15 - 41 U/L - - 38  ALT 0 - 44 U/L - - 28   Studies/Results: No results found.  Principal Problem:   Symptomatic anemia    Tye Savoy, NP-C @  12/08/2020, 4:06 PM

## 2020-12-08 NOTE — H&P (View-Only) (Signed)
Referring Provider:  [facility]         Primary Care Physician:  Farrel Gordon, DO Primary Gastroenterologist:   Hankinson Cellar, MD Reason for Consultation:     anemia              ASSESSMENT / PLAN    # 75 yo male with recurrent and profound anemia ( acute on chronic). 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 He reports dark stools at home but ? Takes iron. Chronic anemia may be due in part to CKD. Current decline in hgb possibly secondary to gastric / small bowel AVMs.  --Will likely need EGD or enteroscopy in am. The risks and benefits of EGD with possible biopsies were discussed with the patient who agrees to proceed.  --Check iron studies, B12 level --Two uPRBCs has been ordered.  --am CBC --Pantoprazole 40 mg daily already ordered  # History of multiple and recurrent colon polyps ( SSA and TA). Polyp surveillance colonoscopy due Feb 2025   HISTORY OF PRESENT ILLNESS                                                                                                                         Chief Complaint: anemia  Gregory Nunez. is a 75 y.o. 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 Havery Moros for history of colon polyps first found on screening colonoscopy in 2018. Following that he was seen in hospital in late 2019 and again in February 2022 for IDA , heme positive stools and findings of upper Gi tract AVMs, chronic atrophic gastritis  / intestinal metaplasia on EGD with biopsies. Repeat colonoscopy during Feb 2022 admission remarkable for more colon polyps.    Patient has a history of a CVA and has been undergoing PT, OT and ST. He has some difficulty providing history . Patient  says he was [facility] today when he nearly fell. He was sent to ED where his hgb was 4.9, down from 8.5 in late July. He says he has been having black stools at home for two weeks. Patient is not sure whether he takes an iron supplement though I see one on home med list. He lives with his sister who prepares his medications. He does deny NSAID use but baby asa on home med list. Protonix on home med list. He hasn't been having any abdominal pain, nausea / vomiting. He hasn't felt particularly short of breath .   PREVIOUS ENDOSCOPIC EVALUATIONS    April 2018 screening colonoscopy  -multiple polyps up to 6 mm. Path - TA and SSA  Dec 2019 EGD for IDA -Duodenal bulb AVM, non-bleeding. Ablated. Diffuse gastritis with erosions. Path - chronic gastritis and intestinal metaplasia c/w chronic atrophic  gastritis  Feb 2022 EGD for heme + anemia -non-bleeding AVM in body of stomach, ablated. Five 2-4 mm AVMs in D2 (ablated), a larger AVM with oozing in D3 was ablated. Path - chronic gastritis, reactive changes. No H.pylori.   Feb 2022 colonoscopy with TI intubation to 2 cm for heme + anemia -Adequate bowel prep. Multiple polyps up to 6 mm. Small ascending colon lipoma. Polyp path - hyperplastic polyps, tubular adenomas    Past Medical History:  Diagnosis Date   Cataract    OD   Depression    Diabetes mellitus without complication (HCC)    Diabetic retinopathy ([place])    NPDR OU   GERD (gastroesophageal reflux disease)    if drinks alcohol   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   Patient is approximately 5-week status post bunion correction left foot   Hyperlipidemia    Hypertension    Hypertensive retinopathy    OU   Malignant neoplasm of prostate ([place]) 01/09/2014   Pancreatitis    Prostate cancer ([place]) 12/19/2013   Gleason 4+3=7, volume 31.31 cc   Shortness of breath dyspnea    with exertion     Past Surgical History:  Procedure Laterality Date   BIOPSY  04/16/2018   Procedure:  BIOPSY;  Surgeon: Juanita Craver, MD;  Location: [facility];  Service: Endoscopy;;   BIOPSY  05/23/2020   Procedure: BIOPSY;  Surgeon: Jackquline Denmark, MD;  Location: [facility];  Service: Gastroenterology;;  EGD and COLON   biopsy on throat     hx of    CATARACT EXTRACTION Bilateral    Dr. Quentin Ore   COLONOSCOPY N/A 05/23/2020   Procedure: COLONOSCOPY;  Surgeon: Jackquline Denmark, MD;  Location: [facility];  Service: Gastroenterology;  Laterality: N/A;   ENTEROSCOPY N/A 10/07/2020   Procedure: ENTEROSCOPY;  Surgeon: [name], MD;  Location: [facility] ENDOSCOPY;  Service: Endoscopy;  Laterality: N/A;   ESOPHAGOGASTRODUODENOSCOPY Left 04/16/2018   Procedure: ESOPHAGOGASTRODUODENOSCOPY (EGD);  Surgeon: Juanita Craver, MD;  Location: [facility];  Service: Endoscopy;  Laterality: Left;   ESOPHAGOGASTRODUODENOSCOPY (EGD) WITH PROPOFOL N/A 05/23/2020   Procedure: ESOPHAGOGASTRODUODENOSCOPY (EGD) WITH PROPOFOL;  Surgeon: Jackquline Denmark, MD;  Location: [facility];  Service: Gastroenterology;  Laterality: N/A;   EYE SURGERY     FOOT SURGERY     HOT HEMOSTASIS N/A 04/16/2018   Procedure: HOT HEMOSTASIS (ARGON PLASMA COAGULATION/BICAP);  Surgeon: Juanita Craver, MD;  Location: [facility];  Service: Endoscopy;  Laterality: N/A;   HOT HEMOSTASIS N/A 05/23/2020   Procedure: HOT HEMOSTASIS (ARGON PLASMA COAGULATION/BICAP);  Surgeon: Jackquline Denmark, MD;  Location: [facility];  Service: Gastroenterology;  Laterality: N/A;   LYMPHADENECTOMY Bilateral 02/27/2014   Procedure: BILATERAL LYMPHADENECTOMY;  Surgeon: Alexis Frock, MD;  Location: [facility];  Service: Urology;  Laterality: Bilateral;   POLYPECTOMY  05/23/2020   Procedure: POLYPECTOMY;  Surgeon: Jackquline Denmark, MD;  Location: [facility];  Service: Gastroenterology;;   PROSTATE BIOPSY  12/2013   Gleason 4+3=7, volume 31.31 cc   ROBOT ASSISTED LAPAROSCOPIC RADICAL PROSTATECTOMY N/A 02/27/2014   Procedure: ROBOTIC ASSISTED LAPAROSCOPIC RADICAL PROSTATECTOMY  WITH INDOCYANINE GREEN DYE;  Surgeon: Alexis Frock, MD;  Location: [facility];  Service: Urology;  Laterality: N/A;    Prior to Admission medications   Medication Sig Start Date End Date Taking? Authorizing Provider  amLODipine (NORVASC) 10 MG tablet Take 1 tablet (10 mg total) by mouth daily. 11/06/20  Yes Farrel Gordon, DO  aspirin EC 81 MG EC tablet Take 1 tablet (81 mg total) by  mouth daily. Swallow whole. 10/24/20 10/24/21 Yes Jennye Boroughs, MD  atorvastatin (LIPITOR) 40 MG tablet Take 1 tablet (40 mg total) by mouth daily. 10/08/20 01/06/21 Yes Annita Brod, MD  carvedilol (COREG) 25 MG tablet Take 1 tablet (25 mg total) by mouth daily. 10/24/20  Yes Jennye Boroughs, MD  levETIRAcetam (KEPPRA) 500 MG tablet Take 1 tablet (500 mg total) by mouth 2 (two) times daily. 10/08/20  Yes Annita Brod, MD  losartan (COZAAR) 100 MG tablet Take 100 mg by mouth daily.   Yes [provider]  metFORMIN (GLUCOPHAGE) 500 MG tablet Take 500 mg by mouth 2 (two) times daily. 02/13/19  Yes [provider]  pantoprazole (PROTONIX) 40 MG tablet Take 1 tablet (40 mg total) by mouth daily. 11/06/20  Yes Farrel Gordon, DO  iron polysaccharides (NU-IRON) 150 MG capsule Take 1 capsule (150 mg total) by mouth daily. Patient not taking: Reported on 12/08/2020 10/24/20   Jennye Boroughs, MD  nicotine (NICODERM CQ - DOSED IN MG/24 HOURS) 14 mg/24hr patch Place 1 patch (14 mg total) onto the skin daily. Patient not taking: Reported on 12/08/2020 11/06/20 12/18/20  Farrel Gordon, DO  COMBIVENT RESPIMAT 20-100 MCG/ACT AERS respimat Inhale 1 puff into the lungs every 6 (six) hours as needed for shortness of breath. 06/12/18 08/19/19  [provider]  ferrous sulfate 325 (65 FE) MG tablet Take 1 tablet (325 mg total) by mouth 2 (two) times daily with a meal. Patient not taking: Reported on 08/19/2019 04/17/18 08/19/19  Dana Allan I, MD  mometasone-formoterol (DULERA) 100-5 MCG/ACT AERO Inhale 2 puffs into the lungs  daily. Patient not taking: Reported on 08/19/2019 04/17/18 08/19/19  Dana Allan I, MD  sucralfate (CARAFATE) 1 g tablet Take 1 tablet (1 g total) by mouth 4 (four) times daily. Patient not taking: Reported on 08/19/2019 04/17/18 08/19/19  Dana Allan I, MD  tiotropium (SPIRIVA HANDIHALER) 18 MCG inhalation capsule Place 1 capsule (18 mcg total) into inhaler and inhale daily. Patient not taking: Reported on 08/19/2019 04/17/18 08/19/19  Dana Allan I, MD  traZODone (DESYREL) 50 MG tablet Take 50 mg by mouth at bedtime as needed for sleep. Patient not taking: Reported on 10/06/2020  10/08/20  [provider]    Current Facility-Administered Medications  Medication Dose Route Frequency Provider Last Rate Last Admin   0.9 %  sodium chloride infusion (Manually program via Guardrails IV Fluids)   Intravenous Once Virl Axe, MD       Derrill Memo ON 12/09/2020] atorvastatin (LIPITOR) tablet 40 mg  40 mg Oral Daily Virl Axe, MD       folic acid (FOLVITE) tablet 1 mg  1 mg Oral Daily Virl Axe, MD   1 mg at 12/08/20 1542   insulin aspart (novoLOG) injection 0-15 Units  0-15 Units Subcutaneous TID WC Virl Axe, MD       insulin aspart (novoLOG) injection 0-5 Units  0-5 Units Subcutaneous QHS Virl Axe, MD       levETIRAcetam (KEPPRA) tablet 500 mg  500 mg Oral BID Virl Axe, MD       multivitamin with minerals tablet 1 tablet  1 tablet Oral Daily Virl Axe, MD   1 tablet at 12/08/20 1542   nicotine (NICODERM CQ - dosed in mg/24 hours) patch 14 mg  14 mg Transdermal Daily Virl Axe, MD   14 mg at 12/08/20 1542   [START ON 12/09/2020] pantoprazole (PROTONIX) EC tablet 40 mg  40 mg Oral Daily Virl Axe, MD  thiamine tablet 100 mg  100 mg Oral Daily Virl Axe, MD   100 mg at 12/08/20 1542    Allergies as of 12/08/2020 - Review Complete 12/08/2020  Allergen Reaction Noted   Penicillins Anxiety and Other (See Comments) 05/16/2011    Family  History  Problem Relation Age of Onset   Heart disease Mother    Cancer Sister        breast   Colon cancer Neg Hx    Esophageal cancer Neg Hx    Rectal cancer Neg Hx    Stomach cancer Neg Hx     Social History   Socioeconomic History   Marital status: Married    Spouse name: Not on file   Number of children: Not on file   Years of education: Not on file   Highest education level: Not on file  Occupational History   Not on file  Tobacco Use   Smoking status: Every Day    Packs/day: 1.00    Types: Cigarettes   Smokeless tobacco: Never   Tobacco comments:    1 ppd Wants Patches   Vaping Use   Vaping Use: Never used  Substance and Sexual Activity   Alcohol use: Not Currently    Alcohol/week: 1.0 standard drink    Types: 1 Cans of beer per week   Drug use: Not Currently    Types: Marijuana, Cocaine    Comment: past hx approx 30 years ago    Sexual activity: Not on file  Other Topics Concern   Not on file  Social History Narrative   ** Merged History Encounter **       Social Determinants of Health   Financial Resource Strain: Not on file  Food Insecurity: Not on file  Transportation Needs: Not on file  Physical Activity: Not on file  Stress: Not on file  Social Connections: Not on file  Intimate Partner Violence: Not on file    Review of Systems: All systems reviewed and negative except where noted in HPI.  OBJECTIVE    Physical Exam: Vital signs in last 24 hours: Temp:  [98.1 F (36.7 C)] 98.1 F (36.7 C) (08/22 0946) Pulse Rate:  [69-76] 71 (08/22 1426) Resp:  [16] 16 (08/22 1426) BP: (99-113)/(42-61) 111/61 (08/22 1426) SpO2:  [100 %] 100 % (08/22 1426) Weight:  [77.7 kg-84.6 kg] 84.6 kg (08/22 1600) Last BM Date:  ([id number]) General:   Alert  male in NAD Psych:  Pleasant, cooperative. Normal mood and affect. Eyes:  Pupils equal, sclera clear, no icterus.    Ears:  Normal auditory acuity. Nose:  No deformity, discharge,  or lesions. Neck:   Supple; no masses Lungs:  Clear throughout to auscultation.   No wheezes, crackles, or rhonchi.  Heart:  Regular rate and rhythm;  no lower extremity edema Abdomen:  Soft, non-distended, nontender, BS active, no palp mass   Rectal:  Deferred  Msk:  Symmetrical without gross deformities. . Neurologic:  Alert and  oriented. He does have some difficulty getting his words out  Skin:  Intact without significant lesions or rashes.   Scheduled inpatient medications  sodium chloride   Intravenous Once   [START ON 12/09/2020] atorvastatin  40 mg Oral Daily   folic acid  1 mg Oral Daily   insulin aspart  0-15 Units Subcutaneous TID WC   insulin aspart  0-5 Units Subcutaneous QHS   levETIRAcetam  500 mg Oral BID   multivitamin with minerals  1 tablet Oral Daily  nicotine  14 mg Transdermal Daily   [START ON 12/09/2020] pantoprazole  40 mg Oral Daily   thiamine  100 mg Oral Daily      Intake/Output from previous day: No intake/output data recorded. Intake/Output this shift: Total I/O In: 106.5 [IV Piggyback:106.5] Out: -    Lab Results: Recent Labs    12/08/20 1148  WBC 3.3*  HGB 4.9*  HCT 15.6*  PLT 130*   BMET Recent Labs    12/08/20 1148  NA 138  K 4.2  CL 109  CO2 21*  GLUCOSE 267*  BUN 33*  CREATININE 1.91*  CALCIUM 8.4*   LFT No results for input(s): PROT, ALBUMIN, AST, ALT, ALKPHOS, BILITOT, BILIDIR, IBILI in the last 72 hours. PT/INR No results for input(s): LABPROT, INR in the last 72 hours. Hepatitis Panel No results for input(s): HEPBSAG, HCVAB, HEPAIGM, HEPBIGM in the last 72 hours.   . CBC Latest Ref Rng & Units 12/08/2020 11/06/2020 10/23/2020  WBC 4.0 - 10.5 K/uL 3.3(L) 4.7 4.6  Hemoglobin 13.0 - 17.0 g/dL 4.9(LL) 8.5(L) 11.3(L)  Hematocrit 39.0 - 52.0 % 15.6(L) 25.7(L) 35.2(L)  Platelets 150 - 400 K/uL 130(L) 151 178    . CMP Latest Ref Rng & Units 12/08/2020 10/24/2020 10/22/2020  Glucose 70 - 99 mg/dL 267(H) 94 113(H)  BUN 8 - 23 mg/dL 33(H) 22 21   Creatinine 0.61 - 1.24 mg/dL 1.91(H) 1.64(H) 1.68(H)  Sodium 135 - 145 mmol/L 138 142 139  Potassium 3.5 - 5.1 mmol/L 4.2 3.8 4.1  Chloride 98 - 111 mmol/L 109 112(H) 110  CO2 22 - 32 mmol/L 21(L) 24 22  Calcium 8.9 - 10.3 mg/dL 8.4(L) 8.7(L) 8.6(L)  Total Protein 6.5 - 8.1 g/dL - - 7.8  Total Bilirubin 0.3 - 1.2 mg/dL - - 0.6  Alkaline Phos 38 - 126 U/L - - 57  AST 15 - 41 U/L - - 38  ALT 0 - 44 U/L - - 28   Studies/Results: No results found.  Principal Problem:   Symptomatic anemia    Tye Savoy, NP-C @  12/08/2020, 4:06 PM

## 2020-12-08 NOTE — Assessment & Plan Note (Signed)
Patient presented due to multiple low blood pressure readings at neuro rehab this morning.  Please see details under her symptomatic anemia.  Currently on amlodipine 10 mg, carvedilol 25 and losartan 100 mg.  Currently holding these medications in the setting of symptomatic anemia and low blood pressures.  Will recommend restarting uptitrating once he is discharged from the hospital.

## 2020-12-08 NOTE — Therapy (Addendum)
[facility] [address] [place], [place], [address] Phone: [phone]   Fax:  [phone]  Patient Details  Name: Gregory Nunez. MRN: [id number] Date of Birth: 11/23/1945 Referring Provider:  Farrel Gordon, DO  Arrived - No Charge Encounter Date: 12/08/2020  Pt almost had a fall in the parking lot. His legs got real shaky and wobbly. Had to get some help and someone got a w/c for him. [name] (family member present today) took his blood sugar this morning and it was 223  Took pt's BP in w/c and was 110/42. Provided pt with water and elevated his legs. PT calling pt's PCP office to notify provider and see when pt can be seen. Rechecked BP and was 103/44 in sitting. PT able to speak to scheduling and nurse, pt to be seen this morning at PCP's office at 9:45. Pt had a similar episode of BP dropping in therapy a couple weeks ago. PT wheeled patient out to car (was driven to clinic by family member) and transferred pt to car with gait belt and stand step transfer with no issues. Discussed cancelling remainder of this week's appts until pt is medically stable.    Arliss Journey, PT, DPT   12/08/2020, 9:30 AM  [facility] [address] [place], [place], [address] Phone: [phone]   Fax:  [phone]

## 2020-12-08 NOTE — Progress Notes (Addendum)
   CC: weakness, hypotension  HPI:  [name] is a 75 y.o. with a PMHx listed below presenting for bilateral lower extremity weakness and hypotension noted at his neuro rehab this morning. For details of today's visit and the status of his chronic medical issues please refer to the assessment and plan.   Past Medical History:  Diagnosis Date   Cataract    OD   Depression    Diabetes mellitus without complication (HCC)    Diabetic retinopathy ([place])    NPDR OU   GERD (gastroesophageal reflux disease)    if drinks alcohol   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   Patient is approximately 5-week status post bunion correction left foot   Hyperlipidemia    Hypertension    Hypertensive retinopathy    OU   Malignant neoplasm of prostate ([place]) 01/09/2014   Pancreatitis    Prostate cancer ([place]) 12/19/2013   Gleason 4+3=7, volume 31.31 cc   Shortness of breath dyspnea    with exertion    Review of Systems: Negative except as per assessment and plan  Physical Exam:  Vitals:   12/08/20 0946 12/08/20 0957 12/08/20 1207 12/08/20 1243  BP: (!) 106/47 (!) 99/44 (!) 113/58 (!) 110/53  Pulse: 71 69 70 69  Temp: 98.1 F (36.7 C)     TempSrc: Oral     SpO2: 100% 100%  100%  Weight: 171 lb 6.4 oz (77.7 kg)     Height: '5\' 11"'$  (1.803 m)      Physical Exam General: alert, appears stated age, in no acute distress HEENT: Normocephalic, atraumatic, EOM intact, conjunctiva normal CV: Regular rate and rhythm, no murmurs rubs or gallops Pulm: Clear to auscultation bilaterally, normal work of breathing Abdomen: Soft, nondistended, bowel sounds present, no tenderness to palpation MSK: No lower extremity edema Skin: Warm and dry Neuro: Alert and oriented x3   Assessment & Plan:   See Encounters Tab for problem based charting.  Patient discussed with Dr. Jimmye Norman

## 2020-12-08 NOTE — Telephone Encounter (Signed)
Received call from Rossie, PT from [facility]. States patient nearly fell walking into their office today. He was placed in w/c and BP 110/42 and 103/44 on recheck. States similar episode at their office 2 weeks ago and BP was 93/47. First available appt given this AM.

## 2020-12-08 NOTE — Patient Instructions (Signed)
I am checking some blood work today, pending these results I will call to discuss how we would like to adjust her blood pressure medications.

## 2020-12-08 NOTE — Assessment & Plan Note (Signed)
Patient is requesting a home shower chair.  DME order placed.

## 2020-12-08 NOTE — Progress Notes (Signed)
NEW ADMISSION NOTE New Admission Note:   Arrival Method: Wheel Chair Mental Orientation: Alert and Oriented X4 Telemetry: box 12 Assessment: Completed Skin: intact IV: none present Pain: Denies Tubes: none Safety Measures: Safety Fall Prevention Plan has been given, discussed and signed Admission: Completed [facility] Orientation: Patient has been orientated to the room, unit and staff.  Family: not present  Orders have been reviewed and implemented. Will continue to monitor the patient. Call light has been placed within reach and bed alarm has been activated.   Berneta Levins, RN

## 2020-12-09 ENCOUNTER — Encounter (HOSPITAL_COMMUNITY): Payer: Self-pay | Admitting: Internal Medicine

## 2020-12-09 ENCOUNTER — Observation Stay (HOSPITAL_COMMUNITY): Payer: Medicare Other | Admitting: Certified Registered Nurse Anesthetist

## 2020-12-09 ENCOUNTER — Encounter (HOSPITAL_COMMUNITY): Admission: RE | Disposition: A | Discharge status: Home / Self Care | Payer: Self-pay | Attending: Internal Medicine

## 2020-12-09 DIAGNOSIS — D649 Anemia, unspecified: Secondary | ICD-10-CM

## 2020-12-09 DIAGNOSIS — K31811 Angiodysplasia of stomach and duodenum with bleeding: Secondary | ICD-10-CM

## 2020-12-09 DIAGNOSIS — I10 Essential (primary) hypertension: Secondary | ICD-10-CM | POA: Diagnosis not present

## 2020-12-09 DIAGNOSIS — R58 Hemorrhage, not elsewhere classified: Secondary | ICD-10-CM

## 2020-12-09 DIAGNOSIS — K31819 Angiodysplasia of stomach and duodenum without bleeding: Secondary | ICD-10-CM

## 2020-12-09 DIAGNOSIS — D132 Benign neoplasm of duodenum: Secondary | ICD-10-CM

## 2020-12-09 DIAGNOSIS — K317 Polyp of stomach and duodenum: Secondary | ICD-10-CM | POA: Diagnosis not present

## 2020-12-09 HISTORY — PX: BIOPSY: SHX5522

## 2020-12-09 HISTORY — PX: ESOPHAGOGASTRODUODENOSCOPY (EGD) WITH PROPOFOL: SHX5813

## 2020-12-09 HISTORY — PX: HOT HEMOSTASIS: SHX5433

## 2020-12-09 LAB — BPAM RBC
Blood Product Expiration Date: 202209242359
Blood Product Expiration Date: 202209242359
ISSUE DATE / TIME: 202208221801
ISSUE DATE / TIME: 202208222251
Unit Type and Rh: 5100
Unit Type and Rh: 5100

## 2020-12-09 LAB — TYPE AND SCREEN
ABO/RH(D): O POS
Antibody Screen: NEGATIVE
Unit division: 0
Unit division: 0

## 2020-12-09 LAB — BASIC METABOLIC PANEL
Anion gap: 6 (ref 5–15)
BUN: 26 mg/dL — ABNORMAL HIGH (ref 8–23)
CO2: 21 mmol/L — ABNORMAL LOW (ref 22–32)
Calcium: 8.1 mg/dL — ABNORMAL LOW (ref 8.9–10.3)
Chloride: 109 mmol/L (ref 98–111)
Creatinine, Ser: 1.59 mg/dL — ABNORMAL HIGH (ref 0.61–1.24)
GFR, Estimated: 46 mL/min — ABNORMAL LOW (ref >60)
Glucose, Bld: 91 mg/dL (ref 70–99)
Potassium: 3.6 mmol/L (ref 3.5–5.1)
Sodium: 136 mmol/L (ref 135–145)

## 2020-12-09 LAB — GLUCOSE, CAPILLARY
Glucose-Capillary: 96 mg/dL (ref 70–99)
Glucose-Capillary: 98 mg/dL (ref 70–99)
Glucose-Capillary: 130 mg/dL — ABNORMAL HIGH (ref 70–99)
Glucose-Capillary: 142 mg/dL — ABNORMAL HIGH (ref 70–99)
Glucose-Capillary: 79 mg/dL (ref 70–99)

## 2020-12-09 LAB — CBC
HCT: 22.6 % — ABNORMAL LOW (ref 39.0–52.0)
Hemoglobin: 7.4 g/dL — ABNORMAL LOW (ref 13.0–17.0)
MCH: 29.5 pg (ref 26.0–34.0)
MCHC: 32.7 g/dL (ref 30.0–36.0)
MCV: 90 fL (ref 80.0–100.0)
Platelets: 133 10*3/uL — ABNORMAL LOW (ref 150–400)
RBC: 2.51 MIL/uL — ABNORMAL LOW (ref 4.22–5.81)
RDW: 15.3 % (ref 11.5–15.5)
WBC: 4.3 10*3/uL (ref 4.0–10.5)
nRBC: 0 % (ref 0.0–0.2)

## 2020-12-09 LAB — VITAMIN B12: Vitamin B-12: 799 pg/mL (ref 180–914)

## 2020-12-09 LAB — FOLATE: Folate: 32.8 ng/mL (ref >5.9)

## 2020-12-09 SURGERY — ESOPHAGOGASTRODUODENOSCOPY (EGD) WITH PROPOFOL
Anesthesia: Monitor Anesthesia Care

## 2020-12-09 MED ORDER — PROPOFOL 500 MG/50ML IV EMUL
INTRAVENOUS | Status: DC | PRN
  Administered 2020-12-09: 75 ug/kg/min via INTRAVENOUS

## 2020-12-09 MED ORDER — ASPIRIN EC 81 MG PO TBEC
81.0000 mg | DELAYED_RELEASE_TABLET | Freq: Every day | ORAL | Status: DC
  Administered 2020-12-09: 81 mg via ORAL
  Filled 2020-12-09: qty 1

## 2020-12-09 MED ORDER — LACTATED RINGERS IV SOLN
INTRAVENOUS | Status: AC | PRN
  Administered 2020-12-09: 10 mL/h via INTRAVENOUS

## 2020-12-09 SURGICAL SUPPLY — 15 items

## 2020-12-09 NOTE — Anesthesia Preprocedure Evaluation (Signed)
Anesthesia Evaluation  Patient identified by MRN, date of birth, ID band Patient awake    Reviewed: Allergy & Precautions, NPO status , Patient's Chart, lab work & pertinent test results  Airway Mallampati: II  TM Distance: >3 FB     Dental   Pulmonary shortness of breath, Current Smoker and Patient abstained from smoking.,    breath sounds clear to auscultation       Cardiovascular hypertension,  Rhythm:Regular Rate:Normal     Neuro/Psych Seizures -,     GI/Hepatic Neg liver ROS, GERD  ,  Endo/Other  diabetes  Renal/GU Renal disease     Musculoskeletal   Abdominal   Peds  Hematology   Anesthesia Other Findings   Reproductive/Obstetrics                             Anesthesia Physical Anesthesia Plan  ASA: 3  Anesthesia Plan: MAC   Post-op Pain Management:    Induction: Intravenous  PONV Risk Score and Plan: Propofol infusion  Airway Management Planned: Simple Face Mask and Nasal Cannula  Additional Equipment:   Intra-op Plan:   Post-operative Plan:   Informed Consent: I have reviewed the patients History and Physical, chart, labs and discussed the procedure including the risks, benefits and alternatives for the proposed anesthesia with the patient or authorized representative who has indicated his/her understanding and acceptance.     Dental advisory given  Plan Discussed with: CRNA and Anesthesiologist  Anesthesia Plan Comments:         Anesthesia Quick Evaluation

## 2020-12-09 NOTE — Progress Notes (Addendum)
Subjective:  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 He has symptomatic anemia, but he is improving and doing well this morning. After receiving 2 units of blood, his Hb increased to 7.4. He says he is feeling stronger than yesterday and is ready to get up and walk. He has not had any bowel movements, he is not having any pain, and his vitals are stable.  Objective:  Vital signs in last 24 hours: Vitals:   12/08/20 2037 12/08/20 2229 12/08/20 2310 12/09/20 0254  BP: 139/79 136/73 133/73 130/72  Pulse: 81 78 87 78  Resp: '18 18 18 18  '$ Temp: 97.9 F (36.6 C) 98.6 F (37 C) 98.6 F (37 C) 98.1 F (36.7 C)  TempSrc: Oral   Oral  SpO2: 100%  100% 100%  Weight:  84.8 kg    Height:       Weight change:   Intake/Output Summary (Last 24 hours) at 12/09/2020 0840 Last data filed at 12/09/2020 0600 Gross per 24 hour  Intake 1126.53 ml  Output 2500 ml  Net -1373.47 ml    General: Pleasant, well-appearing male laying in bed. No acute distress. Eyes: pale sclera noted CV: RRR. No murmurs, rubs, or gallops. No LE edema Pulmonary: Lungs CTAB. Normal effort. No wheezing or rales. Abdominal: Soft, nontender, moderately distended. Normal bowel sounds. Extremities: Palpable radial and DP pulses. Normal ROM. Skin: Warm and dry. Nailbed capillaries not visible and are pale. Neuro: A&Ox3. Moves all extremities. Normal sensation. No focal deficit. Psych: Normal mood and affect  Assessment/Plan:  Principal Problem:   Symptomatic anemia  #Symptomatic Anemia #Acute on chronic anemia The patient presented with lower extremity weakness that began yesterday. He was transfused with 2 units of PRBCs, and Hb increased from 4.9 to 7.4. There is no need for additional  transfusion at this time. His RBC count and hematocrit are still low, but both have increased since yesterday. GI was consulted to evaluate the source of the bleed. - GI will complete a push enteroscopy today. If that is negative, they will complete a capsule endoscopy. The patient will be NPO until the procedure is complete. - Protonix '40mg'$  daily - Continue to monitor Hb; transfuse if Hb <7  #AKI on CKD IIIa The patient's creatinine decreased to 1.59 from a value at 1.9. His baseline creatinine is 1.5. His GFR has improved from yesterday to 46.  - Continue monitoring renal function - Avoid nephrotoxic agents  #Orthostatic Hypotension The patient was orthostatic positive when evaluated in clinic yesterday. Afterward, 2 units of PRBCs and 1 L of LR were given. Blood pressure today is at goal (130/72 at 02:54). 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 His B12 and folate levels were checked this morning and are normal. - Hold aspirin in the setting of possible GI bleed - Continue Lipitor '40mg'$  - PT to evaluate and treat   #Hypertension The patient is taking Losartan, coreg, carvedilol, and amlodipine at home for high blood pressure. - Hold home BP medications until tomorrow  #Alcohol use disorder The patient had his last drink on Saturday (<72 hours ago). He  has had no symptoms of alcohol withdrawal and CIWA score was 0 overnight. - Continue monitoring for withdrawal symptoms (CIWA protocol) - Continue home keppra  #Tobacco use disorder The patient smokes 0.5-1 packs per day and has done so for 20+ years. Nicotine patch was placed yesterday. - Daily nicotine patch change  #Type 2 Diabetes A1c was 6.2 in July. He takes metformin '500mg'$  BID at home. - Continue to hold  metformin - SSI  Best practices: Diet: NPO, will resume renal diet after procedures Fluids: None DVT prophylaxis: SCDs Code: Full Dispo: Likely discharge back to home in 2-3 days pending clinical improvement, pending PT recs   LOS: 1 days   Danie Chandler, Medical Student 12/09/2020, 8:40 AM   Attestation for Student Documentation:  I personally was present and performed or re-performed the history, physical exam and medical decision-making activities of this service and have verified that the service and findings are accurately documented in the student's note.  Dorethea Clan, DO 12/09/2020, 9:42 AM

## 2020-12-09 NOTE — Hospital Course (Addendum)
Gregory Nunez. Is a 75 yo male with PMHx of DM, HTN, HLD, prostate cancer s/p prostatectomy, previous CVAs in Jan and Feb, previous alcohol withdrawal seizure and chronic atrophic gastritis with intestinal metaplasia who presented with lower extremity weakness and was admitted for symptomatic anemia  #Symptomatic Anemia #Acute on chronic anemia Patient presented with lower extremity weakness that began today.  SBP has been in the 90s and he has been orthostatic positive when evaluated in the clinic. Hb in the clinic was found to be 4.9. Had EGD in June 2022 which was stable and showed no active signs of bleeding. 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 The patient does endorse some melena at this time, but is unable to quantify this at this time. Also denies any recent NSAID use, aside from baby aspirin, and denies any anticoagulation use He was transfused with 2 units of PRBCs, and Hb increased from 4.9 to 7.4. Had EGD done with GI on 8/23 with showed two small angioectasias without bleeding in the duodenal bulb s/p APC and a 46m sessile polyp that was removed from the duodenum. Recommended for capsule endoscopy as an outpatient. Hb on the day of discharge was stable at 8.1. Iron studies came back low, with iron level 21 and iron saturation 6%. Patient given '500mg'$  Infed infusion, and will likely need 1,'000mg'$  more in the future as an outpatient. Also recommended patient to take over the counter iron supplements. Will follow up with GI on discharge for possible capsule endoscopy.  #AKI on CKD IIIa Patient Cr elevated to 1.9 today, up from his baseline of around 1.5. Likely prerenal in nature and secondary to GI bleed. The patient's creatinine decreased to 1.59 s/p 2 units of PRBCs and improved  even further on the day of discharge to 1.39.   #Orthostatic Hypotension The patient was orthostatic positive when evaluated in clinic yesterday. Afterward, 2 units of PRBCs and 1 L of LR were given. Blood pressure on 8/23 is at goal (130/72), and repeat orthostatics were performed and were negative. BP elevated on the day of discharge, will resume home meds except carvedilol, as his HR has been low.   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 His B12 and folate levels were checked and were both within normal limits. Aspirin initially held in the setting of possible GI bleed, but was resumed on 8/23. Continued on lipitor therapy as well.    #Hypertension The patient is taking Losartan, coreg, carvedilol, and amlodipine at home for high blood pressure. Meds were held on admission in the setting of orthostatic hypotension but losartan and coreg were resumed. Carvedilol can be resumed by PCP if BP is not within goal and if HR is improved.   #Alcohol use disorder Patient currently takes keppra '500mg'$  daily ever since he had a syncopal seizure in June 2022. This seizure was attributed to alcohol withdrawal at the time. The patient had his last drink on Saturday (<72 hours ago). He has had no symptoms of alcohol withdrawal and CIWA score was 0 overnight. Continued home keppra.   #Tobacco use disorder The patient smokes 0.5-1 packs per day and has done so for 20+ years. Nicotine patch was placed.    #  Type 2 Diabetes A1c was 6.2 in July. He takes metformin '500mg'$  BID at home.

## 2020-12-09 NOTE — Progress Notes (Signed)
  Date: 12/09/2020  Patient name: Gregory Nunez.  Medical record number: [id number]  Date of birth: 1946-03-30   I have seen and evaluated Gregory Nunez. and discussed their care with the Residency Team. Briefly, [name] is a 75 year old man who presented with weakness and was found to have acute on chronic anemia with Hgb of < 5.  Responded to around 7 with 2 Units of PRBC.  GI is consulted and following.  Plan for enteroscopy today.    Vitals:   12/09/20 0954 12/09/20 1019  BP: (!) 150/83 (!) 160/91  Pulse: 85 79  Resp: 19 19  Temp: 98.6 F (37 C) 98.4 F (36.9 C)  SpO2: 100% 100%   Gen: Alert, feeling better, no distress Eyes: + conjunctival pallor, no injection HENT: Mildly dry MM, NCAT CV: RR, NR, no murmur, no pedal edema Pulm: CTAB, no wheezing Abd: Distended, non tender, +BS MSK: Normal tone and bulk for age Psych: Pleasant, normal mood.  Skin: Decreased capillary refill in the nail bed  Assessment and Plan: I have seen and evaluated the patient as outlined above. I agree with the formulated Assessment and Plan as detailed in the residents' note, with the following changes:   1. Acute blood loss anemia, GI bleeding, symptomatic anemia - Hgb improved with 2 units of PRBC - GI planning for enteroscopy today - Trend CBC - Telemetry - Recheck orthostatics after procedure  2. AKI on CKD 3a - AKI improving, almost to baseline today with IVF and blood products - Trend and encourage PO intake after procedure.   3. H/O HTN - BP improved today, consider restarting home medications  Other issues per Dr. Terrall Laity daily note.   Sid Falcon, MD 8/23/202211:33 AM

## 2020-12-09 NOTE — Anesthesia Procedure Notes (Signed)
Procedure Name: MAC Date/Time: 12/09/2020 11:25 AM Performed by: Lowella Dell, CRNA Pre-anesthesia Checklist: Patient identified, Emergency Drugs available, Suction available, Timeout performed and Patient being monitored Patient Re-evaluated:Patient Re-evaluated prior to induction Oxygen Delivery Method: Nasal cannula Placement Confirmation: positive ETCO2 Dental Injury: Teeth and Oropharynx as per pre-operative assessment

## 2020-12-09 NOTE — Anesthesia Postprocedure Evaluation (Signed)
Anesthesia Post Note  Patient: Gregory Nunez.  Procedure(s) Performed: ESOPHAGOGASTRODUODENOSCOPY (EGD) WITH PROPOFOL HOT HEMOSTASIS (ARGON PLASMA COAGULATION/BICAP) BIOPSY     Patient location during evaluation: Endoscopy Anesthesia Type: MAC Level of consciousness: awake Pain management: pain level controlled Vital Signs Assessment: post-procedure vital signs reviewed and stable Respiratory status: spontaneous breathing Cardiovascular status: stable Postop Assessment: no apparent nausea or vomiting Anesthetic complications: no   No notable events documented.  Last Vitals:  Vitals:   12/09/20 0954 12/09/20 1019  BP: (!) 150/83 (!) 160/91  Pulse: 85 79  Resp: 19 19  Temp: 37 C 36.9 C  SpO2: 100% 100%    Last Pain:  Vitals:   12/09/20 1019  TempSrc: Temporal  PainSc: 0-No pain                 Karmina Zufall

## 2020-12-09 NOTE — Op Note (Signed)
[facility] Patient Name: Gregory Nunez Procedure Date : 12/09/2020 MRN: [id number] Attending MD: Thornton Park MD, MD Date of Birth: October 02, 1945 CSN: :[id number] Age: 75 Admit Type: Inpatient Procedure:                Upper GI endoscopy Indications:              Arteriovenous malformation in the small intestine,                            Suspected upper gastrointestinal bleeding in                            patient with chronic blood loss                           Recent melena and progressive anemia requiring PRBCs                           History of gastric and duodenal AVMx, although none                            seen on enteroscopy 09/2020                           Chronic atrophic gastritis with intestinal                            metaplasia identified on prior gastric biopsies in                            2019 Providers:                Thornton Park MD, MD, Clare, Benetta Spar, Technician Referring MD:              Medicines:                Monitored Anesthesia Care Complications:            No immediate complications. Estimated blood loss:                            Minimal. Estimated Blood Loss:     Estimated blood loss was minimal. Procedure:                Pre-Anesthesia Assessment:                           - Prior to the procedure, a History and Physical                            was performed, and patient medications and                            allergies were reviewed. The patient's tolerance of  previous anesthesia was also reviewed. The risks                            and benefits of the procedure and the sedation                            options and risks were discussed with the patient.                            All questions were answered, and informed consent                            was obtained. Prior Anticoagulants: The patient has                             taken no previous anticoagulant or antiplatelet                            agents. ASA Grade Assessment: II - A patient with                            mild systemic disease. After reviewing the risks                            and benefits, the patient was deemed in                            satisfactory condition to undergo the procedure.                           After obtaining informed consent, the endoscope was                            passed under direct vision. Throughout the                            procedure, the patient's blood pressure, pulse, and                            oxygen saturations were monitored continuously. The                            [id number] Olympus slim colonoscope was                            introduced through the mouth, and advanced to the                            jejunum. The upper GI endoscopy was accomplished                            without difficulty. The patient tolerated the  procedure well. Scope In: Scope Out: Findings:      The Z-line was regular and was found 40 cm from the incisors.      Diffuse mild mucosal changes characterized by congestion were found in       the entire examined stomach. Biopsies were taken for gastric mapping       given the history of intestinal metaplasia with a cold forceps for       histology. Estimated blood loss was minimal.      Two small angioectasias without bleeding were found in the duodenal bulb       and in the second portion of the duodenum. Coagulation for bleeding       prevention using argon plasma at 20 watts was successful. Estimated       blood loss: none.      A single 2 mm sessile polyp with no bleeding was found in the second       portion of the duodenum. The polyp was removed with a piecemeal       technique using a cold biopsy forceps. Resection and retrieval were       complete. Estimated blood loss was minimal.      The exam was otherwise  without abnormality. Impression:               - Z-line regular, 40 cm from the incisors.                           - History of gastric intestinal metaplsai. Biopsied.                           - Two non-bleeding angioectasias in the duodenum.                            Treated with argon plasma coagulation (APC).                           - A single duodenal polyp. Resected and retrieved.                           - The examination was otherwise normal. Recommendation:           - Return patient to hospital ward for ongoing care.                           - Advance diet as tolerated.                           - Continue present medications.                           - Follow serial hgb/hct with transfusion as                            indicated.                           - Await pathology results.                           -  Iron replacement.                           - Consider capsule endoscopy to evaluate for more                            distal AVMs if he does not respond to iron                            replacement or has evidence for additional GI blood                            loss. Procedure Code(s):        --- Professional ---                           [phone], 31, Esophagogastroduodenoscopy, flexible,                            transoral; with control of bleeding, any method                           43239, 51, Esophagogastroduodenoscopy, flexible,                            transoral; with biopsy, single or multiple Diagnosis Code(s):        --- Professional ---                           K31.89, Other diseases of stomach and duodenum                           K31.819, Angiodysplasia of stomach and duodenum                            without bleeding                           K31.7, Polyp of stomach and duodenum                           R58, Hemorrhage, not elsewhere classified CPT copyright 2019 [employer] Association. All rights reserved. The codes documented  in this report are preliminary and upon coder review may  be revised to meet current compliance requirements. Thornton Park MD, MD 12/09/2020 12:28:57 PM This report has been signed electronically. Number of Addenda: 0

## 2020-12-09 NOTE — Plan of Care (Signed)
  Problem: Activity: Goal: Risk for activity intolerance will decrease Outcome: Progressing   Problem: Nutrition: Goal: Adequate nutrition will be maintained Outcome: Progressing   Problem: Coping: Goal: Level of anxiety will decrease Outcome: Progressing   

## 2020-12-09 NOTE — Plan of Care (Signed)
  Problem: Coping: Goal: Level of anxiety will decrease Outcome: Progressing   Problem: Elimination: Goal: Will not experience complications related to urinary retention Outcome: Progressing   

## 2020-12-09 NOTE — Interval H&P Note (Signed)
History and Physical Interval Note:  12/09/2020 11:01 AM  Gregory Nunez [name].  has presented today for surgery, with the diagnosis of anemia.  The various methods of treatment have been discussed with the patient and family. After consideration of risks, benefits and other options for treatment, the patient has consented to  Procedure(s): ESOPHAGOGASTRODUODENOSCOPY (EGD) WITH PROPOFOL (N/A) as a surgical intervention.  The patient's history has been reviewed, patient examined, no change in status, stable for surgery.  I have reviewed the patient's chart and labs.  Questions were answered to the patient's satisfaction.     Thornton Park

## 2020-12-09 NOTE — Transfer of Care (Signed)
Immediate Anesthesia Transfer of Care Note  Patient: Gregory Nunez.  Procedure(s) Performed: ESOPHAGOGASTRODUODENOSCOPY (EGD) WITH PROPOFOL HOT HEMOSTASIS (ARGON PLASMA COAGULATION/BICAP) BIOPSY  Patient Location: PACU and Endoscopy Unit  Anesthesia Type:MAC  Level of Consciousness: drowsy and patient cooperative  Airway & Oxygen Therapy: Patient Spontanous Breathing and Patient connected to nasal cannula oxygen  Post-op Assessment: Report given to RN and Post -op Vital signs reviewed and stable  Post vital signs: Reviewed and stable  Last Vitals:  Vitals Value Taken Time  BP    Temp    Pulse 67 12/09/20 1209  Resp 23 12/09/20 1209  SpO2 94 % 12/09/20 1209  Vitals shown include unvalidated device data.  Last Pain:  Vitals:   12/09/20 1019  TempSrc: Temporal  PainSc: 0-No pain         Complications: No notable events documented.

## 2020-12-10 ENCOUNTER — Ambulatory Visit: Payer: Medicare Other | Admitting: Occupational Therapy

## 2020-12-10 ENCOUNTER — Ambulatory Visit: Payer: Medicare Other | Admitting: Speech Pathology

## 2020-12-10 ENCOUNTER — Ambulatory Visit: Payer: Medicare Other

## 2020-12-10 ENCOUNTER — Encounter: Payer: Self-pay | Admitting: Nurse Practitioner

## 2020-12-10 ENCOUNTER — Encounter (HOSPITAL_COMMUNITY): Payer: Self-pay | Admitting: Gastroenterology

## 2020-12-10 ENCOUNTER — Telehealth: Payer: Self-pay

## 2020-12-10 DIAGNOSIS — K31811 Angiodysplasia of stomach and duodenum with bleeding: Secondary | ICD-10-CM | POA: Diagnosis not present

## 2020-12-10 DIAGNOSIS — K31819 Angiodysplasia of stomach and duodenum without bleeding: Secondary | ICD-10-CM | POA: Diagnosis not present

## 2020-12-10 DIAGNOSIS — D649 Anemia, unspecified: Secondary | ICD-10-CM | POA: Diagnosis not present

## 2020-12-10 DIAGNOSIS — R29898 Other symptoms and signs involving the musculoskeletal system: Secondary | ICD-10-CM | POA: Diagnosis not present

## 2020-12-10 DIAGNOSIS — R58 Hemorrhage, not elsewhere classified: Secondary | ICD-10-CM | POA: Diagnosis not present

## 2020-12-10 LAB — CBC
HCT: 23.9 % — ABNORMAL LOW (ref 39.0–52.0)
Hemoglobin: 8.1 g/dL — ABNORMAL LOW (ref 13.0–17.0)
MCH: 29.9 pg (ref 26.0–34.0)
MCHC: 33.9 g/dL (ref 30.0–36.0)
MCV: 88.2 fL (ref 80.0–100.0)
Platelets: 151 10*3/uL (ref 150–400)
RBC: 2.71 MIL/uL — ABNORMAL LOW (ref 4.22–5.81)
RDW: 15.2 % (ref 11.5–15.5)
WBC: 5.4 10*3/uL (ref 4.0–10.5)
nRBC: 0 % (ref 0.0–0.2)

## 2020-12-10 LAB — BASIC METABOLIC PANEL
Anion gap: 9 (ref 5–15)
BUN: 18 mg/dL (ref 8–23)
CO2: 23 mmol/L (ref 22–32)
Calcium: 8.9 mg/dL (ref 8.9–10.3)
Chloride: 107 mmol/L (ref 98–111)
Creatinine, Ser: 1.39 mg/dL — ABNORMAL HIGH (ref 0.61–1.24)
GFR, Estimated: 54 mL/min — ABNORMAL LOW (ref >60)
Glucose, Bld: 81 mg/dL (ref 70–99)
Potassium: 3.6 mmol/L (ref 3.5–5.1)
Sodium: 139 mmol/L (ref 135–145)

## 2020-12-10 LAB — SURGICAL PATHOLOGY

## 2020-12-10 LAB — GLUCOSE, CAPILLARY
Glucose-Capillary: 170 mg/dL — ABNORMAL HIGH (ref 70–99)
Glucose-Capillary: 164 mg/dL — ABNORMAL HIGH (ref 70–99)

## 2020-12-10 MED ORDER — LOSARTAN POTASSIUM 50 MG PO TABS
100.0000 mg | ORAL_TABLET | Freq: Every day | ORAL | Status: DC
  Administered 2020-12-10: 100 mg via ORAL
  Filled 2020-12-10: qty 2

## 2020-12-10 MED ORDER — SODIUM CHLORIDE 0.9 % IV SOLN
25.0000 mg | INTRAVENOUS | Status: AC
  Administered 2020-12-10: 25 mg via INTRAVENOUS
  Filled 2020-12-10: qty 0.5

## 2020-12-10 MED ORDER — AMLODIPINE BESYLATE 10 MG PO TABS
10.0000 mg | ORAL_TABLET | Freq: Every day | ORAL | Status: DC
  Administered 2020-12-10: 10 mg via ORAL
  Filled 2020-12-10: qty 1

## 2020-12-10 MED ORDER — ASPIRIN EC 81 MG PO TBEC
81.0000 mg | DELAYED_RELEASE_TABLET | Freq: Every day | ORAL | Status: DC
  Administered 2020-12-10: 81 mg via ORAL
  Filled 2020-12-10: qty 1

## 2020-12-10 MED ORDER — SODIUM CHLORIDE 0.9 % IV SOLN
500.0000 mg | Freq: Once | INTRAVENOUS | Status: AC
  Administered 2020-12-10: 500 mg via INTRAVENOUS
  Filled 2020-12-10: qty 10

## 2020-12-10 NOTE — Progress Notes (Signed)
Progress Note  Chief Complaint: anemia        ASSESSMENT AND PLAN   # 75 yo male with recurrent and profound iron deficiency anemia ( acute on chronic). History of atrophic gastritis and upper gastrointestinal AVMs. EGD yesterday remarkable for 2 non-bleeding duodenal AVMs treated with APC and a duodenal polyp ( removed) --Hgb 4.9 >> 8.1 after 2 uPRBC.  Ferritin 8, IV iron given this am. --B12 okay at 799 --If hgb declines again in future, consider small bowel video capsule --Await duodenal polyp path --Follow up in office with me on 01/20/21 at 1030am.  --Nu-Iron listed on home med list but patient cannot verify that he was taking it. 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 Polyp surveillance colonoscopy due Feb 2025  DIAGNOSTIC STUDIES  8/23 enteroscopy  -Z-line regular, 40 cm from the incisors. - History of gastric intestinal metaplsai. Biopsied. - Two non-bleeding angioectasias in the duodenum. Treated with argon plasma coagulation (APC). - A single duodenal polyp. Resected and retrieved. - The examination was otherwise normal.   SUBJECTIVE   Feels okay       OBJECTIVE      Scheduled inpatient medications:   amLODipine  10 mg Oral Daily   aspirin EC  81 mg Oral Daily   atorvastatin  40 mg Oral Daily   folic acid  1 mg Oral Daily   insulin aspart  0-15 Units Subcutaneous TID WC   insulin aspart  0-5 Units Subcutaneous QHS   levETIRAcetam  500 mg Oral BID   losartan  100 mg Oral Daily   multivitamin with minerals  1 tablet Oral Daily   nicotine  14 mg Transdermal Daily   pantoprazole  40 mg Oral Daily   thiamine  100 mg Oral Daily   Continuous inpatient infusions:   iron dextran (INFED/DEXFERRUM) infusion     PRN inpatient medications:   Vital signs in last 24 hours: Temp:  [97.8 F (36.6 C)-99.2 F (37.3 C)] 99 F (37.2 C) (08/24 0936) Pulse  Rate:  [66-80] 80 (08/24 0936) Resp:  [18-26] 20 (08/24 0936) BP: (102-166)/(46-92) 142/69 (08/24 0936) SpO2:  [91 %-100 %] 100 % (08/24 0936) Weight:  [86.6 kg] 86.6 kg (08/23 2126) Last BM Date: 12/07/20  Intake/Output Summary (Last 24 hours) at 12/10/2020 1039 Last data filed at 12/10/2020 0809 Gross per 24 hour  Intake 520 ml  Output 900 ml  Net -380 ml     Physical Exam:  General: Alert male in NAD Heart:  Regular rate and rhythm.  Pulmonary: Normal respiratory effort Abdomen: Soft, nondistended, nontender. Normal bowel sounds.  Neurologic: Alert and oriented Psych: Pleasant. Cooperative.   Filed Weights   12/08/20 2229 12/09/20 1019 12/09/20 2126  Weight: 84.8 kg 86.6 kg 86.6 kg    Intake/Output from previous day: 08/23 0701 - 08/24 0700 In: 400 [I.V.:400] Out: 1300 [Urine:1300] Intake/Output this shift: Total I/O In: 120 [P.O.:120] Out: -     Lab Results: Recent Labs    12/08/20 1148 12/09/20 0503 12/10/20 0429  WBC 3.3* 4.3 5.4  HGB 4.9* 7.4* 8.1*  HCT 15.6* 22.6* 23.9*  PLT 130* 133* 151   BMET Recent Labs    12/08/20 1148 12/09/20 0503 12/10/20 0429  NA 138 136 139  K 4.2 3.6 3.6  CL 109 109 107  CO2 21* 21* 23  GLUCOSE 267* 91 81  BUN 33* 26* 18  CREATININE 1.91* 1.59* 1.39*  CALCIUM 8.4* 8.1* 8.9   LFT No results for input(s): PROT, ALBUMIN, AST, ALT, ALKPHOS, BILITOT, BILIDIR, IBILI in the last 72 hours. PT/INR Recent Labs    12/08/20 1628  LABPROT 14.5  INR 1.1   Hepatitis Panel No results for input(s): HEPBSAG, HCVAB, HEPAIGM, HEPBIGM in the last 72 hours.  No results found.      Principal Problem:   Symptomatic anemia Active Problems:   Adenomatous duodenal polyp   AVM (arteriovenous malformation) of duodenum, acquired with hemorrhage     LOS: 0 days   Gregory Nunez ,NP 12/10/2020, 10:39 AM

## 2020-12-10 NOTE — Care Management Obs Status (Signed)
[facility] NOTIFICATION   Patient Details  Name: Gregory Nunez. MRN: [id number] Date of Birth: 1946-04-03   Medicare Observation Status Notification Given:  Yes    Tom-Debruyne, Renea Ee, RN 12/10/2020, 10:52 AM

## 2020-12-10 NOTE — Progress Notes (Signed)
Nutrition Brief Note  Patient identified on the Malnutrition Screening Tool (MST) Report. Pt is observation status.   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   Patient is approximately 5-week status post bunion correction left foot   Hyperlipidemia    Hypertension    Hypertensive retinopathy    OU   Malignant neoplasm of prostate ([place]) 01/09/2014   Pancreatitis    Prostate cancer ([name]) 12/19/2013   Gleason 4+3=7, volume 31.31 cc   Shortness of breath dyspnea    with exertion    Medications:  Scheduled Meds:  amLODipine  10 mg Oral Daily   aspirin EC  81 mg Oral Daily   atorvastatin  40 mg Oral Daily   folic acid  1 mg Oral Daily   insulin aspart  0-15 Units Subcutaneous TID WC   insulin aspart  0-5 Units Subcutaneous QHS   levETIRAcetam  500 mg Oral BID   losartan  100 mg Oral Daily   multivitamin with minerals  1 tablet Oral Daily   nicotine  14 mg Transdermal Daily   pantoprazole  40 mg Oral Daily   thiamine  100 mg Oral Daily  Continuous Infusions:  iron dextran (INFED/DEXFERRUM) infusion     Labs: Recent Labs  Lab 12/08/20 1148 12/09/20 0503 12/10/20 0429  NA 138 136 139  K 4.2 3.6 3.6  CL 109 109 107  CO2 21* 21* 23  BUN 33* 26* 18  CREATININE 1.91* 1.59* 1.39*  CALCIUM 8.4* 8.1* 8.9  GLUCOSE 267* 91 81  CBGs: 142-79-170  Wt Readings from Last 15 Encounters:  12/09/20 86.6 kg  12/08/20 77.7 kg  11/06/20 77.9 kg  10/22/20 82.6 kg  10/07/20 77 kg  09/13/20 82.6 kg  05/23/20 79.9 kg  04/23/20 84.4 kg  12/05/19 84.4 kg  08/19/19 83.5 kg  11/08/18 81.2 kg  07/28/18 81.6 kg  04/15/18 81.2 kg  04/10/18 82.1 kg  07/19/16 81.6 kg  Body mass index is 26.64 kg/m. Patient meets  criteria for overweight based on current BMI.   Current diet order is renal, patient is consuming approximately 100% of meals at this time.  No nutrition interventions warranted at this time. If nutrition issues arise, please consult RD.   Gregory Ina, MS, RD, LDN (she/her/hers) RD pager number and weekend/on-call pager number located in [place].

## 2020-12-10 NOTE — Progress Notes (Signed)
AVS reviewed and pt verbalized understanding of all DC teaching. IV removed without complications. All pt belongings are in pt possession.

## 2020-12-10 NOTE — Evaluation (Signed)
Physical Therapy Evaluation Patient Details Name: Gregory Nunez. MRN: [id number] DOB: 06-13-1945 Today's Date: 12/10/2020   History of Present Illness  75 y.o. male presents to [facility] ED on 12/08/2020 due to LE weakness. Pt found to be anemic. Orthostatic hypotension was noted in clinic on 8/22. 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  Clinical Impression  PT chart review completed prior to order cancellation. Pt demonstrates deficits in balance, safety awareness, and gait. Pt demonstrates multiple losses of balance which he is able to self-correct with stepping strategy. Pt does not reports orthostatic symptoms this session. Pt also incontinent of urine during session which exacerbates his falls risk. Pt will benefit from continued outpatient PT at the time of discharge to improve balance and reduce falls risk.    Follow Up Recommendations Outpatient PT;Supervision for mobility/OOB    Equipment Recommendations  None recommended by PT (pt reports having access to small based quad cane)    Recommendations for Other Services       Precautions / Restrictions Precautions Precautions: Fall Restrictions Weight Bearing Restrictions: No      Mobility  Bed Mobility Overal bed mobility: Independent                  Transfers Overall transfer level: Needs assistance Equipment used: None Transfers: Sit to/from Stand Sit to Stand: Supervision            Ambulation/Gait Ambulation/Gait assistance: Min guard Gait Distance (Feet): 200 Feet Assistive device: None Gait Pattern/deviations: Step-through pattern;Drifts right/left Gait velocity: funcitonal Gait velocity interpretation: 1.31 - 2.62 ft/sec, indicative of limited community ambulator General Gait Details: pt ambulates at a supervision level with static gait, with dual-task or high-level  challenges pt demonstrates increased lateral drift with multiple losses of balance requiring stepping strategy to correct.  Stairs            Wheelchair Mobility    Modified Rankin (Stroke Patients Only)       Balance Overall balance assessment: History of Falls;Needs assistance Sitting-balance support: No upper extremity supported;Feet supported Sitting balance-Leahy Scale: Good     Standing balance support: No upper extremity supported Standing balance-Leahy Scale: Fair                               Pertinent Vitals/Pain Pain Assessment: No/denies pain    Home Living Family/patient expects to be discharged to:: Private residence Living Arrangements: Other relatives Available Help at Discharge: Family;Available 24 hours/day Type of Home: House Home Access: Level entry     Home Layout: One level Home Equipment: Cane - quad Additional Comments: pt reports there is a quad cane at his sisters home which he can utilize    Prior Function Level of Independence: Independent         Comments: pt reports ambulating independently but does report 2 recent falls, one on the way into PT this past week and another in the grass near home.     Hand Dominance   Dominant Hand: Right    Extremity/Trunk Assessment   Upper Extremity Assessment Upper Extremity Assessment: Overall WFL for tasks assessed    Lower Extremity Assessment Lower Extremity Assessment: Overall WFL for tasks assessed    Cervical / Trunk Assessment Cervical / Trunk Assessment: Normal  Communication   Communication: No difficulties  Cognition Arousal/Alertness: Awake/alert Behavior During Therapy: WFL for tasks  assessed/performed Overall Cognitive Status: No family/caregiver present to determine baseline cognitive functioning                                 General Comments: pt with some impaired awareness of deficits, with poor recognition of balance deficits and risk  of harm from falls      General Comments General comments (skin integrity, edema, etc.): BP supine 152/85 (105), sitting 140/74 (94), standing 129/74 (89). Unable to record BP after 3 minutes standing as pt has urgent need to urinate. Pt denies symptoms of orthostatic BP    Exercises     Assessment/Plan    PT Assessment Patient needs continued PT services  PT Problem List Decreased balance;Decreased mobility;Decreased safety awareness;Decreased knowledge of precautions       PT Treatment Interventions DME instruction;Gait training;Stair training;Functional mobility training;Therapeutic activities;Therapeutic exercise;Balance training;Neuromuscular re-education;Patient/family education    PT Goals (Current goals can be found in the Care Plan section)  Acute Rehab PT Goals Patient Stated Goal: to return to outpatient PT and improve balance PT Goal Formulation: With patient Time For Goal Achievement: 12/24/20 Potential to Achieve Goals: Good Additional Goals Additional Goal #1: Pt will score >19/24 of DGI to indicate a reduced risk for falls    Frequency Min 3X/week   Barriers to discharge        Co-evaluation               AM-PAC PT "6 Clicks" Mobility  Outcome Measure Help needed turning from your back to your side while in a flat bed without using bedrails?: None Help needed moving from lying on your back to sitting on the side of a flat bed without using bedrails?: None Help needed moving to and from a bed to a chair (including a wheelchair)?: A Little Help needed standing up from a chair using your arms (e.g., wheelchair or bedside chair)?: A Little Help needed to walk in hospital room?: A Little Help needed climbing 3-5 steps with a railing? : A Little 6 Click Score: 20    End of Session   Activity Tolerance: Patient tolerated treatment well Patient left: in chair;with call bell/phone within reach;with chair alarm set Nurse Communication: Mobility status PT  Visit Diagnosis: Unsteadiness on feet (R26.81);History of falling (Z91.81)    Time: [id number] PT Time Calculation (min) (ACUTE ONLY): 27 min   Charges:   PT Evaluation $PT Eval Low Complexity: [place], PT, DPT Acute Rehabilitation Pager: [phone]   Zenaida Niece 12/10/2020, 10:09 AM

## 2020-12-10 NOTE — Discharge Instructions (Signed)
FOLLOW-UP INSTRUCTIONS:  Thank you for allowing [place] to be part of your care. You were hospitalized for Symptomatic anemia.  Please follow up with the following providers: A. Farrel Gordon, DO, [address] / [place] [address], [phone] Appointment scheduled on 8/29 at 2:15pm   B. [facility] on 10/4  Please note these changes made to your medications:   A. Medications to continue: Current Meds  Medication Sig   amLODipine (NORVASC) 10 MG tablet Take 1 tablet (10 mg total) by mouth daily.   aspirin EC 81 MG EC tablet Take 1 tablet (81 mg total) by mouth daily. Swallow whole.   atorvastatin (LIPITOR) 40 MG tablet Take 1 tablet (40 mg total) by mouth daily.       levETIRAcetam (KEPPRA) 500 MG tablet Take 1 tablet (500 mg total) by mouth 2 (two) times daily.   losartan (COZAAR) 100 MG tablet Take 100 mg by mouth daily.   metFORMIN (GLUCOPHAGE) 500 MG tablet Take 500 mg by mouth 2 (two) times daily.   pantoprazole (PROTONIX) 40 MG tablet Take 1 tablet (40 mg total) by mouth daily.      B. Medications to discontinue:  Carvedilol - please discontinue until you follow up with the Internal Medicine Clinic    Please make sure to follow up with the Internal Medicine Clinic for a hospital follow up and BP check. You will also have to follow up with the gastroenterologist's office in October.   Please call our clinic if you have any questions or concerns, we may be able to help and keep you from a long and expensive emergency room wait. Our clinic and after hours phone number is [phone], the best time to call is Monday through Friday 9 am to 4 pm but there is always someone available 24/7 if you have an emergency. If you need medication refills please notify your pharmacy one week in advance and they will send [place] a request.

## 2020-12-10 NOTE — TOC Transition Note (Signed)
Transition of Care [facility]) - CM/SW Discharge Note   Patient Details  Name: Gregory Nunez. MRN: [id number] Date of Birth: [birth year]  [facility]) CM/SW Contact:  Tom-Derusha, Renea Ee, RN Phone Number: 12/10/2020, 1:57 PM   Clinical Narrative:    CM consulted for outpatient TOC needs. Spoke with patient at bedside. States he lives with his sister and niece and both takes him to and from is appointments. Endorse he drinks 1 ounce alcohol/day and smokes 1/2 pack cigarettes/day. Currently has Nicotine patch on. States he would like to quit smoking and hopes the Nicotine patch helps. Outpatient rehab choices given to patient and he chose [facility]. Information placed on AVS. Sister Oren Section to pick at discharge. No further TOC needs at this time.   Final next level of care: OP Rehab Barriers to Discharge: No Barriers Identified   Patient Goals and CMS Choice Patient states their goals for this hospitalization and ongoing recovery are:: To go hyome CMS Medicare.gov Compare Post Acute Care list provided to:: Patient Choice offered to / list presented to : Patient  Discharge Placement                       Discharge Plan and Services   Discharge Planning Services: CM Consult Post Acute Care Choice:  (Out patient rehab.)          DME Arranged: N/A DME Agency: NA       HH Arranged: NA HH Agency: NA        Social Determinants of Health (SDOH) Interventions     Readmission Risk Interventions No flowsheet data found.

## 2020-12-10 NOTE — Discharge Summary (Addendum)
Name: Gregory Nunez Gregory Nunez. MRN: [id number] DOB: 02-26-1946 75 y.o. PCP: Farrel Gordon, DO  Date of Admission: 12/08/2020  2:17 PM Date of Discharge:  12/09/2020 Attending Physician: Dr. Daryll Drown  DISCHARGE DIAGNOSIS:  Primary Problem: Symptomatic anemia   Hospital Problems: Principal Problem:   Symptomatic anemia Active Problems:   Adenomatous duodenal polyp   AVM (arteriovenous malformation) of duodenum, acquired with hemorrhage    DISCHARGE MEDICATIONS:   Allergies as of 12/10/2020       Reactions   Penicillins Anxiety, Other (See Comments)   DID THE REACTION INVOLVE: Swelling of the face/tongue/throat, SOB, or low BP? No Sudden or severe rash/hives, skin peeling, or the inside of the mouth or nose? No Did it require medical treatment? No When did it last happen?      many years ago If all above answers are "NO", may proceed with cephalosporin use.        Medication List     STOP taking these medications    carvedilol 25 MG tablet Commonly known as: COREG       TAKE these medications    amLODipine 10 MG tablet Commonly known as: NORVASC Take 1 tablet (10 mg total) by mouth daily.   aspirin 81 MG EC tablet Take 1 tablet (81 mg total) by mouth daily. Swallow whole.   atorvastatin 40 MG tablet Commonly known as: LIPITOR Take 1 tablet (40 mg total) by mouth daily.   iron polysaccharides 150 MG capsule Commonly known as: Nu-Iron Take 1 capsule (150 mg total) by mouth daily.   levETIRAcetam 500 MG tablet Commonly known as: Keppra Take 1 tablet (500 mg total) by mouth 2 (two) times daily.   losartan 100 MG tablet Commonly known as: COZAAR Take 100 mg by mouth daily.   metFORMIN 500 MG tablet Commonly known as: GLUCOPHAGE Take 500 mg by mouth 2 (two) times daily.   nicotine 14 mg/24hr patch Commonly known as: NICODERM CQ - dosed in mg/24 hours Place 1 patch (14 mg total) onto the skin daily.   pantoprazole 40 MG tablet Commonly known as:  PROTONIX Take 1 tablet (40 mg total) by mouth daily.        DISPOSITION AND FOLLOW-UP:  Gregory Nunez. was discharged from [facility] in Stable condition. At the hospital follow up visit please address:  Follow-up Recommendations: Consults: Neuro rehab on discharge (already has scheduled), GI Labs: Basic Metabolic Profile and CBC Studies: capsule endoscopy as outpatient with GI Medications: STOP carvedilol, can recheck BP upon visit with PCP and assess need at that time  Follow-up Appointments:  Follow-up Information     [facility] Call.   Specialty: Rehabilitation Why: Call to schedule outpatient therapy. Thanks Contact information: [address] [id number] mc [place]        Madalyn Rob, MD. Go on 12/15/2020.   Specialty: Internal Medicine Why: hospital follow up; BP check Contact information: [address] [place] [address] [phone]               Appt with [facility] on 10/4 at 10:30AM  HOSPITAL COURSE:  Patient Summary: Gregory Nunez. 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  SBP has been in the 90s and he has been orthostatic positive when evaluated in the clinic. Hb in the clinic was found to be 4.9. Had EGD in June 2022 which was stable and showed no active signs of bleeding. 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 EGD also  diagnosed chronic atrophic gastritis with intestinal metaplasia at this time. The patient does endorse some melena at this time, but is unable to quantify this at this time. Also denies any recent NSAID use, aside from baby aspirin, and denies any anticoagulation use He was transfused with 2 units of PRBCs, and Hb increased from 4.9 to 7.4. Had EGD done with GI on 8/23 with showed two small angioectasias without bleeding in the duodenal bulb s/p APC and a 21m sessile polyp that was removed from the duodenum. Recommended for capsule endoscopy as an outpatient. Hb on the day of discharge was stable at 8.1. Iron studies came back low, with iron level 21 and iron saturation 6%. Patient given '500mg'$  Infed infusion, and will likely need 1,'000mg'$  more in the future as an outpatient. Also recommended patient to take over the counter iron supplements. Will follow up with GI on discharge for possible capsule endoscopy.  #AKI on CKD IIIa Patient Cr elevated to 1.9 today, up from his baseline of around 1.5. Likely prerenal in nature and secondary to GI bleed. The patient's creatinine decreased to 1.59 s/p 2 units of PRBCs and improved even further on the day of discharge to 1.39.   #Orthostatic Hypotension The patient was orthostatic positive when evaluated in clinic yesterday. Afterward, 2 units of PRBCs and 1 L of LR were given. Blood pressure on 8/23 is at goal (130/72), and repeat orthostatics were performed and were negative. BP elevated on the day of discharge, will resume home meds except carvedilol, as his HR has been low.   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 His B12 and folate levels were checked and were both within normal limits. Aspirin initially held in the setting of possible GI bleed, but was resumed on 8/23. Continued on lipitor therapy as well.    #Hypertension The  patient is taking Losartan, carvedilol, and amlodipine at home for high blood pressure. Meds were held on admission in the setting of orthostatic hypotension but losartan and amlodipine were resumed. Carvedilol can be resumed by PCP if BP is not within goal and if HR is improved.   #Alcohol use disorder Patient currently takes keppra '500mg'$  daily ever since he had a syncopal seizure in June 2022. This seizure was attributed to alcohol withdrawal at the time. The patient had his last drink on Saturday (<72 hours ago). He has had no symptoms of alcohol withdrawal and CIWA score was 0 overnight. Continued home keppra.   #Tobacco use disorder The patient smokes 0.5-1 packs per day and has done so for 20+ years. Nicotine patch was placed.  Recommend continued cessation discussion outpatient.    #Type 2 Diabetes A1c was 6.2 in July. He takes metformin '500mg'$  BID at home.   DISCHARGE INSTRUCTIONS:   Discharge Instructions     Ambulatory referral to Physical Therapy   Complete by: As directed    Call MD for:  extreme fatigue   Complete by: As directed    Call MD for:  persistant dizziness or light-headedness   Complete by: As directed    Diet - low sodium heart healthy   Complete by: As directed    Increase activity slowly   Complete by: As directed        SUBJECTIVE:  Gregory Nunez was seen and evaluated at the bedside this morning. He reports that he feels well overall and is ready to be discharged to home. Orthostatic blood pressures were negative. Patient also denies any further episodes of melena or hematochezia. Will follow up with his PCP to recheck blood pressure and labwork, and will also follow up with GI as an outpatient for possible capsule endoscopy.   Discharge Vitals:   BP (!) 142/69 (BP Location: Right Arm) Comment: RN aware  Pulse 80   Temp 99 F (37.2 C) (Oral)   Resp 20   Ht '5\' 11"'$  (1.803 m)   Wt 86.6 kg   SpO2 100%   BMI 26.64 kg/m   OBJECTIVE:  General: Pleasant,  well-appearing male laying in bed. No acute distress. Eyes: pale sclera noted CV: RRR. No murmurs, rubs, or gallops. No LE edema Pulmonary: Lungs CTAB. Normal effort. No wheezing or rales. Abdominal: Soft, nontender, moderately distended. Normal bowel sounds. Extremities: Palpable radial and DP pulses. Normal ROM. Skin: Warm and dry. Nailbed capillaries not visible and are pale. Neuro: A&Ox3. Moves all extremities. Normal sensation. No focal deficit. Psych: Normal mood and affect   Pertinent Labs, Studies, and Procedures:  CBC Latest Ref Rng & Units 12/10/2020 12/09/2020 12/08/2020  WBC 4.0 - 10.5 K/uL 5.4 4.3 3.3(L)  Hemoglobin 13.0 - 17.0 g/dL 8.1(L) 7.4(L) 4.9(LL)  Hematocrit 39.0 - 52.0 % 23.9(L) 22.6(L) 15.6(L)  Platelets 150 - 400 K/uL 151 133(L) 130(L)    CMP Latest Ref Rng & Units 12/10/2020 12/09/2020 12/08/2020  Glucose 70 - 99 mg/dL 81 91 267(H)  BUN 8 - 23 mg/dL 18 26(H) 33(H)  Creatinine 0.61 - 1.24 mg/dL 1.39(H) 1.59(H) 1.91(H)  Sodium 135 - 145 mmol/L 139 136 138  Potassium 3.5 - 5.1 mmol/L 3.6 3.6 4.2  Chloride 98 - 111 mmol/L 107 109 109  CO2 22 - 32 mmol/L 23 21(L) 21(L)  Calcium 8.9 - 10.3 mg/dL 8.9 8.1(L) 8.4(L)  Total Protein 6.5 - 8.1 g/dL - - -  Total Bilirubin 0.3 - 1.2 mg/dL - - -  Alkaline Phos 38 - 126 U/L - - -  AST 15 - 41 U/L - - -  ALT 0 - 44 U/L - - -   FOLLOW-UP INSTRUCTIONS:  Thank you for allowing [place] to be part of your care. You were hospitalized for Symptomatic anemia.  Please follow up with the following providers: A. Farrel Gordon, DO, [address] / [place] [address], [phone] Appointment scheduled on 8/29 at 2:15pm   B. [facility] on 10/4  Please note these changes made to your medications:   A. Medications to continue: Current Meds  Medication Sig   amLODipine (NORVASC) 10 MG tablet Take 1 tablet (10 mg total) by mouth daily.   aspirin EC 81 MG EC tablet Take 1 tablet (81 mg total) by mouth daily. Swallow whole.    atorvastatin (LIPITOR) 40 MG tablet Take 1 tablet (40 mg total) by mouth daily.       levETIRAcetam (KEPPRA) 500 MG tablet Take 1 tablet (500 mg total) by mouth 2 (two) times daily.   losartan (COZAAR)  100 MG tablet Take 100 mg by mouth daily.   metFORMIN (GLUCOPHAGE) 500 MG tablet Take 500 mg by mouth 2 (two) times daily.   pantoprazole (PROTONIX) 40 MG tablet Take 1 tablet (40 mg total) by mouth daily.      B. Medications to discontinue:  Carvedilol - please discontinue until you follow up with the Internal Medicine Clinic    Please make sure to follow up with the Internal Medicine Clinic for a hospital follow up and BP check. You will also have to follow up with the gastroenterologist's office in October.   Please call our clinic if you have any questions or concerns, we may be able to help and keep you from a long and expensive emergency room wait. Our clinic and after hours phone number is [phone], the best time to call is Monday through Friday 9 am to 4 pm but there is always someone available 24/7 if you have an emergency. If you need medication refills please notify your pharmacy one week in advance and they will send [place] a request.      Signed: Buddy Duty, D.O.  Internal Medicine Resident, PGY-1 [facility]  Pager: [phone] 1:27 PM, 12/10/2020

## 2020-12-10 NOTE — Telephone Encounter (Signed)
Hospital TOC per Dr. Raymondo Band, discharge 12/10/2020, appt 8/29.

## 2020-12-12 ENCOUNTER — Encounter: Payer: Medicare Other | Admitting: Occupational Therapy

## 2020-12-15 ENCOUNTER — Ambulatory Visit: Payer: Medicare Other | Admitting: Speech Pathology

## 2020-12-15 ENCOUNTER — Ambulatory Visit: Payer: Medicare Other | Admitting: Physical Therapy

## 2020-12-15 ENCOUNTER — Encounter: Payer: Medicare Other | Admitting: Internal Medicine

## 2020-12-15 ENCOUNTER — Ambulatory Visit: Payer: Medicare Other | Admitting: Occupational Therapy

## 2020-12-15 NOTE — Progress Notes (Signed)
Internal Medicine Clinic Attending ° °Case discussed with Dr. Rehman  At the time of the visit.  We reviewed the resident’s history and exam and pertinent patient test results.  I agree with the assessment, diagnosis, and plan of care documented in the resident’s note.  ° °

## 2020-12-17 ENCOUNTER — Ambulatory Visit: Payer: Medicare Other

## 2020-12-17 ENCOUNTER — Encounter: Payer: Self-pay | Admitting: *Deleted

## 2020-12-23 ENCOUNTER — Ambulatory Visit: Payer: Medicare Other | Admitting: Diagnostic Neuroimaging

## 2020-12-23 ENCOUNTER — Encounter: Payer: Self-pay | Admitting: *Deleted

## 2020-12-24 ENCOUNTER — Ambulatory Visit: Payer: Medicare Other

## 2020-12-24 ENCOUNTER — Encounter: Payer: Self-pay | Admitting: Diagnostic Neuroimaging

## 2020-12-24 ENCOUNTER — Ambulatory Visit: Payer: Medicare Other | Admitting: *Deleted

## 2020-12-24 ENCOUNTER — Ambulatory Visit: Payer: Medicare Other | Attending: Internal Medicine

## 2020-12-24 ENCOUNTER — Encounter: Payer: Medicare Other | Admitting: Internal Medicine

## 2020-12-24 DIAGNOSIS — R2681 Unsteadiness on feet: Secondary | ICD-10-CM | POA: Insufficient documentation

## 2020-12-24 DIAGNOSIS — R4701 Aphasia: Secondary | ICD-10-CM | POA: Insufficient documentation

## 2020-12-24 DIAGNOSIS — M6281 Muscle weakness (generalized): Secondary | ICD-10-CM | POA: Insufficient documentation

## 2020-12-24 DIAGNOSIS — I69318 Other symptoms and signs involving cognitive functions following cerebral infarction: Secondary | ICD-10-CM | POA: Insufficient documentation

## 2020-12-24 DIAGNOSIS — R41841 Cognitive communication deficit: Secondary | ICD-10-CM | POA: Insufficient documentation

## 2020-12-24 DIAGNOSIS — R278 Other lack of coordination: Secondary | ICD-10-CM | POA: Insufficient documentation

## 2020-12-24 DIAGNOSIS — R2689 Other abnormalities of gait and mobility: Secondary | ICD-10-CM | POA: Insufficient documentation

## 2020-12-24 DIAGNOSIS — Z09 Encounter for follow-up examination after completed treatment for conditions other than malignant neoplasm: Secondary | ICD-10-CM | POA: Insufficient documentation

## 2020-12-24 NOTE — Assessment & Plan Note (Deleted)
Admitted 8/22-8/23/22 for symptomatic anemia (presenting hemoglobin 4.9).  S/p 2U PRBC and Infed (iron) transfusion EGD unrevealing of the source and was recommended to follow up with GI after discharge for capsule endoscopy.   Assessment: Ganzoni equation, based on admission labs, suggest a '1500mg'$  iron deficit. Received '500mg'$  infusion inpatient so will need another '1000mg'$ . Plan CBC Set up for outpatient iron transfusions x2  Follow up with GI 10/4

## 2020-12-25 ENCOUNTER — Ambulatory Visit: Payer: Medicare Other

## 2020-12-25 ENCOUNTER — Other Ambulatory Visit: Payer: Self-pay

## 2020-12-25 DIAGNOSIS — M6281 Muscle weakness (generalized): Secondary | ICD-10-CM

## 2020-12-25 DIAGNOSIS — R4701 Aphasia: Secondary | ICD-10-CM | POA: Diagnosis present

## 2020-12-25 DIAGNOSIS — R2681 Unsteadiness on feet: Secondary | ICD-10-CM

## 2020-12-25 DIAGNOSIS — R278 Other lack of coordination: Secondary | ICD-10-CM | POA: Diagnosis present

## 2020-12-25 DIAGNOSIS — R2689 Other abnormalities of gait and mobility: Secondary | ICD-10-CM

## 2020-12-25 DIAGNOSIS — R41841 Cognitive communication deficit: Secondary | ICD-10-CM | POA: Diagnosis present

## 2020-12-25 DIAGNOSIS — I69318 Other symptoms and signs involving cognitive functions following cerebral infarction: Secondary | ICD-10-CM | POA: Diagnosis present

## 2020-12-25 NOTE — Patient Instructions (Signed)
Access Code: [email or web address] URL: [email or web address] Date: 12/25/2020 Prepared by: Cherly Anderson  Exercises Walking March - 2 x daily - 7 x weekly - 1 sets - 3-4 reps Side Stepping with Counter Support - 2 x daily - 7 x weekly - 1 sets - 3-4 reps Tandem Walking with Counter Support - 2 x daily - 7 x weekly - 1 sets - 3-4 reps Standing Single Leg Stance with Counter Support - 2 x daily - 7 x weekly - 1 sets - 3 reps - 20sec hold

## 2020-12-25 NOTE — Therapy (Signed)
[facility] [address] [place], [place], [address] Phone: [phone]   Fax:  [phone]  Speech Language Pathology Treatment  Patient Details  Name: Gregory Nunez. MRN: [id number] Date of Birth: [birth year] Referring Provider (SLP): Dr. Velna Ochs   Encounter Date: 12/25/2020   End of Session - 12/25/20 1233     Visit Number 5    Number of Visits 25    Date for SLP Re-Evaluation 02/09/21    SLP Start Time 0934    SLP Stop Time  1856    SLP Time Calculation (min) 41 min    Activity Tolerance Patient tolerated treatment well             Past Medical History:  Diagnosis Date   CAO (chronic airflow obstruction) ([place])    Cataract    OD   CVA (cerebral vascular accident) ([place])    Depression    Diabetes mellitus without complication ([place])    Diabetic retinopathy ([place])    NPDR OU   GERD (gastroesophageal reflux disease)    if drinks alcohol   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   Patient is approximately 5-week status post bunion correction left foot   Hyperlipidemia    Hypertension    Hypertensive retinopathy    OU   Malignant neoplasm of prostate ([place]) 01/09/2014   Neuropathy    Pancreatitis    Prostate cancer ([place]) 12/19/2013   Gleason 4+3=7, volume 31.31 cc   Sciatica    Shortness of breath dyspnea    with exertion     Past Surgical History:  Procedure Laterality Date   BIOPSY  04/16/2018   Procedure: BIOPSY;  Surgeon: Juanita Craver, MD;  Location: [facility];  Service: Endoscopy;;   BIOPSY  05/23/2020   Procedure: BIOPSY;  Surgeon: Jackquline Denmark, MD;  Location: [facility];  Service: Gastroenterology;;  EGD and COLON   BIOPSY  12/09/2020   Procedure: BIOPSY;  Surgeon: [place] Park, MD;  Location: [facility] ENDOSCOPY;  Service: Gastroenterology;;   biopsy on throat     hx of    CATARACT EXTRACTION Bilateral    Dr. Quentin Ore   COLONOSCOPY N/A 05/23/2020   Procedure:  COLONOSCOPY;  Surgeon: Jackquline Denmark, MD;  Location: [facility];  Service: Gastroenterology;  Laterality: N/A;   ENTEROSCOPY N/A 10/07/2020   Procedure: ENTEROSCOPY;  Surgeon: [name], MD;  Location: [facility];  Service: Endoscopy;  Laterality: N/A;   ESOPHAGOGASTRODUODENOSCOPY Left 04/16/2018   Procedure: ESOPHAGOGASTRODUODENOSCOPY (EGD);  Surgeon: Juanita Craver, MD;  Location: [facility];  Service: Endoscopy;  Laterality: Left;   ESOPHAGOGASTRODUODENOSCOPY (EGD) WITH PROPOFOL N/A 05/23/2020   Procedure: ESOPHAGOGASTRODUODENOSCOPY (EGD) WITH PROPOFOL;  Surgeon: Jackquline Denmark, MD;  Location: [facility];  Service: Gastroenterology;  Laterality: N/A;   ESOPHAGOGASTRODUODENOSCOPY (EGD) WITH PROPOFOL N/A 12/09/2020   Procedure: ESOPHAGOGASTRODUODENOSCOPY (EGD) WITH PROPOFOL;  Surgeon: [facility] Park, MD;  Location: [facility];  Service: Gastroenterology;  Laterality: N/A;   EYE SURGERY     FOOT SURGERY     HOT HEMOSTASIS N/A 04/16/2018   Procedure: HOT HEMOSTASIS (ARGON PLASMA COAGULATION/BICAP);  Surgeon: Juanita Craver, MD;  Location: [facility];  Service: Endoscopy;  Laterality: N/A;   HOT HEMOSTASIS N/A 05/23/2020   Procedure: HOT HEMOSTASIS (ARGON PLASMA COAGULATION/BICAP);  Surgeon: Jackquline Denmark, MD;  Location: [facility];  Service: Gastroenterology;  Laterality: N/A;   HOT HEMOSTASIS N/A 12/09/2020   Procedure: HOT HEMOSTASIS (ARGON PLASMA COAGULATION/BICAP);  Surgeon: [place] Park, MD;  Location: [facility];  Service:  Gastroenterology;  Laterality: N/A;   LYMPHADENECTOMY Bilateral 02/27/2014   Procedure: BILATERAL LYMPHADENECTOMY;  Surgeon: Alexis Frock, MD;  Location: [facility];  Service: Urology;  Laterality: Bilateral;   POLYPECTOMY  05/23/2020   Procedure: POLYPECTOMY;  Surgeon: Jackquline Denmark, MD;  Location: [facility];  Service: Gastroenterology;;   PROSTATE BIOPSY  12/2013   Gleason 4+3=7, volume 31.31 cc   ROBOT ASSISTED LAPAROSCOPIC RADICAL PROSTATECTOMY N/A  02/27/2014   Procedure: ROBOTIC ASSISTED LAPAROSCOPIC RADICAL PROSTATECTOMY WITH INDOCYANINE GREEN DYE;  Surgeon: Alexis Frock, MD;  Location: [facility];  Service: Urology;  Laterality: N/A;    There were no vitals filed for this visit.   Subjective Assessment - 12/25/20 1227     Subjective "My words - finidng my words. I'm having trouble - speech - speeching - to speech - - speeching the words."    Patient is accompained by: Family member   Pam   Currently in Pain? No/denies                   ADULT SLP TREATMENT - 12/25/20 1003       General Information   Behavior/Cognition Alert;Cooperative;Pleasant mood      Treatment Provided   Treatment provided Cognitive-Linquistic      Pain Assessment   Pain Assessment No/denies pain      Cognitive-Linquistic Treatment   Treatment focused on Aphasia;Cognition;Patient/family/caregiver education    Skilled Treatment Pt arrives with Pam, niece. HW was again not complete "We were in the hospital," Pam stated. Pt was d/c'd on 12-10-20 so SLP brought up the fact pt did in fact have time to do homework. Pam stated the binder was in her trunk.SLP strongly encouraged pt/Pam to complete the [facility] for pt's next appointment 12-30-20. SLP provided cues for pt to look for appropriate section in his binder, which he did with extra time and occasional min-mod A. SLP worked with pt with divergent naming (written homework) and pt chose correct category 4/5. During this time, SLP highlighted to [facility] ways to cue pt if he has difficulty. Pam demonstrated understanding. SLP reviewed pt's homework of taking medication, cooking with supervision, and doing bills with Legrand Como (nephew). Pt req'd total A with what his homework was after 15 minutes. Pam stated she would see to it pt has opportunityto complete this homework over the weekend.      Assessment / Recommendations / Plan   Plan Continue with current plan of care      Progression Toward Goals   Progression  toward goals Progressing toward goals              SLP Education - 12/25/20 1233     Education Details need to complete homework, what homework from last session was    Person(s) Educated Patient;Caregiver(s)    Methods Explanation    Comprehension Verbalized understanding;Need further instruction              SLP Short Term Goals - 12/25/20 1239       SLP SHORT TERM GOAL #1   Title Pt will use external aids to recall am meds with 3 or less reminders from family over 1 week    Baseline family remidning his consistently every day    Time 1    Period Weeks    Status Not Met      SLP SHORT TERM GOAL #2   Title [place] will carryover 2 compensations for memory and attention to complete 3 house hold chores a week with [place]  min A from family    Baseline not completing house hold chores    Time 1    Period Weeks    Status Not Met      SLP SHORT TERM GOAL #3   Title 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 18/20 sentences with occasional min A    Time 1    Period Weeks    Status Not Met      SLP SHORT TERM GOAL #4   Title Pt will name 10 items for personally relevant category using verbal compensations as needed with occasional min A over 2 sessions    Time 1    Period Weeks    Status Not Met      SLP SHORT TERM GOAL #5   Title Pt will fill out 2 money orders to pay bills with occasional min A from family    Baseline Nephew is paying his bills for him    Time 1    Period Weeks    Status Not Met              SLP Long Term Goals - 12/25/20 1239       SLP LONG TERM GOAL #1   Title Pt will use external aids and compensations to manage his medications, appointments and daily to do list with occasional min A from family    Baseline Family managing meds and appointments    Time 9    Period Weeks    Status On-going      SLP LONG TERM GOAL #2   Title 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 over 15 minute conversation  to be 95% intelligible with occasional min A    Time 9    Period Weeks    Status On-going      SLP LONG TERM GOAL #3   Title Pt will ID aphasic errors in simple conversation with 3 or less verbal cues and attempt to self correct errors    Baseline Pt unaware of errors    Time 9    Period Weeks    Status On-going      SLP LONG TERM GOAL #4   Title Pt will use organization system, calendar to pay 3 bills on time with occasional min A from family.    Baseline family managing his finances    Time 9    Period Weeks    Status On-going      SLP LONG TERM GOAL #5   Title Family will rate Masin' communciation on [employer] Communicative Participation Item [employer] General Short Form a 12 or higher    Baseline rated a score of 9    Time 9    Period Weeks    Status On-going      SLP LONG TERM GOAL #6   Title Pt will carryover 3 strategies for attention and memory to cook3  simple meals with supervision from family    Baseline not cooking    Time 9    Period Weeks    Status On-going              Plan - 12/25/20 1233     Clinical Impression Statement Moderate aphasia and cognitive impairments including memory, awareness, executive function, and attention. Word finding in structured tasks required mod A. Initiated training in compensatory strategies for cognition and aphasia, as well as cognitive and language activities to do at home. Pt continues to required supervision/family A for IADL's. Continue skilled  ST to maximize cognition and language for improved independence, safety and pt's goal to return to his home independently.    Speech Therapy Frequency 2x / week    Duration 12 weeks    Treatment/Interventions Cueing hierarchy;Environmental controls;Language facilitation;SLP instruction and feedback;Compensatory strategies;Functional tasks;Cognitive reorganization;Compensatory techniques;Internal/external aids;Multimodal communcation approach;Patient/family education    Potential  Considerations Co-morbidities;Previous level of function             Patient will benefit from skilled therapeutic intervention in order to improve the following deficits and impairments:   Aphasia  Cognitive communication deficit    Problem List Patient Active Problem List   Diagnosis Date Noted   Hospital discharge follow-up 12/24/2020   Weakness of both legs    Adenomatous duodenal polyp    AVM (arteriovenous malformation) of duodenum, acquired with hemorrhage    Need for hepatitis C screening test 11/10/2020   Pneumococcal vaccination administered at current visit 11/10/2020   Healthcare maintenance 11/06/2020   Hypoglycemia 56/81/2751   Acute metabolic encephalopathy 70/04/7492   Acute on chronic anemia 10/22/2020   Angiodysplasia of intestine    GI bleed 10/05/2020   Melena    Upper GI bleed    Iron deficiency anemia 05/22/2020   Constipation 05/22/2020   GIB (gastrointestinal bleeding) 05/21/2020   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 ([place])    History of CVA (cerebrovascular accident) 04/23/2020   Hyperlipidemia associated with type 2 diabetes mellitus ([place]) 04/23/2020   Alcohol use 04/23/2020   AKI (acute kidney injury) ([place]) 04/23/2020   Tobacco use 04/23/2020   Unresponsive 11/08/2018   Syncopal seizure ([place]) 11/08/2018   Hypertension associated with diabetes ([place]) 11/08/2018   Alcohol withdrawal seizure with complication, with unspecified complication ([place]) 49/67/5916   Type 2 diabetes mellitus without complication ([place]) 38/46/6599   Symptomatic anemia 04/15/2018   Prostate cancer ([place]) 02/27/2014   Malignant neoplasm of prostate ([place]) 01/09/2014   Bunion 01/17/2013   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    Walter Min ,MS, CCC-SLP  12/25/2020, 12:41 PM  [facility] [address] [place], [place], [address] Phone: [phone]   Fax:  [phone]   Name:  Gustabo Gordillo. MRN: [id number] Date of Birth: [birth year]

## 2020-12-25 NOTE — Therapy (Signed)
[facility] [address] [place], [place], [address] Phone: [phone]   Fax:  [phone]  Physical Therapy Treatment  Patient Details  Name: Gregory Nunez. MRN: [id number] Date of Birth: 30-Jun-1945 Referring Provider (PT): Reymundo Poll, MD   Encounter Date: 12/25/2020   PT End of Session - 12/25/20 0848     Visit Number 3    Number of Visits 13    Date for PT Re-Evaluation 01/24/21    Authorization Type [employer] Medicare    PT Start Time 0845    PT Stop Time 0925    PT Time Calculation (min) 40 min    Equipment Utilized During Treatment Gait belt    Activity Tolerance Treatment limited secondary to medical complications (Comment)   limited by low BP            Past Medical History:  Diagnosis Date   CAO (chronic airflow obstruction) (HCC)    Cataract    OD   CVA (cerebral vascular accident) (HCC)    Depression    Diabetes mellitus without complication (HCC)    Diabetic retinopathy (HCC)    NPDR OU   GERD (gastroesophageal reflux disease)    if drinks alcohol   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   Patient is approximately 5-week status post bunion correction left foot   Hyperlipidemia    Hypertension    Hypertensive retinopathy    OU   Malignant neoplasm of prostate (HCC) 01/09/2014   Neuropathy    Pancreatitis    Prostate cancer (HCC) 12/19/2013   Gleason 4+3=7, volume 31.31 cc   Sciatica    Shortness of breath dyspnea    with exertion     Past Surgical History:  Procedure Laterality Date   BIOPSY  04/16/2018   Procedure: BIOPSY;  Surgeon: Charna Elizabeth, MD;  Location: [facility];  Service: Endoscopy;;   BIOPSY  05/23/2020   Procedure: BIOPSY;  Surgeon: Lynann Bologna, MD;  Location: [facility];  Service: Gastroenterology;;  EGD and COLON   BIOPSY  12/09/2020   Procedure: BIOPSY;  Surgeon: Tressia Danas, MD;  Location: [facility] ENDOSCOPY;  Service: Gastroenterology;;   biopsy  on throat     hx of    CATARACT EXTRACTION Bilateral    Dr. Baker Pierini   COLONOSCOPY N/A 05/23/2020   Procedure: COLONOSCOPY;  Surgeon: Lynann Bologna, MD;  Location: [facility];  Service: Gastroenterology;  Laterality: N/A;   ENTEROSCOPY N/A 10/07/2020   Procedure: ENTEROSCOPY;  Surgeon: Iva Boop, MD;  Location: [facility] ENDOSCOPY;  Service: Endoscopy;  Laterality: N/A;   ESOPHAGOGASTRODUODENOSCOPY Left 04/16/2018   Procedure: ESOPHAGOGASTRODUODENOSCOPY (EGD);  Surgeon: Charna Elizabeth, MD;  Location: [facility] ENDOSCOPY;  Service: Endoscopy;  Laterality: Left;   ESOPHAGOGASTRODUODENOSCOPY (EGD) WITH PROPOFOL N/A 05/23/2020   Procedure: ESOPHAGOGASTRODUODENOSCOPY (EGD) WITH PROPOFOL;  Surgeon: Lynann Bologna, MD;  Location: [facility];  Service: Gastroenterology;  Laterality: N/A;   ESOPHAGOGASTRODUODENOSCOPY (EGD) WITH PROPOFOL N/A 12/09/2020   Procedure: ESOPHAGOGASTRODUODENOSCOPY (EGD) WITH PROPOFOL;  Surgeon: Tressia Danas, MD;  Location[facility]COPY;  Service: Gastroenterology;  Laterality: N/A;   EYE SURGERY     FOOT SURGERY     HOT HEMOSTASIS N/A 04/16/2018   Procedure: HOT HEMOSTASIS (ARGON PLASMA COAGULATION/BICAP);  Surgeon: Charna Elizabeth, MD;  Location: [facility];  Service: Endoscopy;  Laterality: N/A;   HOT HEMOSTASIS N/A 05/23/2020   Procedure: HOT HEMOSTASIS (ARGON PLASMA COAGULATION/BICAP);  Surgeon: Lynann Bologna, MD;  Location: [facility];  Service: Gastroenterology;  Laterality: N/A;  HOT HEMOSTASIS N/A 12/09/2020   Procedure: HOT HEMOSTASIS (ARGON PLASMA COAGULATION/BICAP);  Surgeon: Thornton Park, MD;  Location: [place];  Service: Gastroenterology;  Laterality: N/A;   LYMPHADENECTOMY Bilateral 02/27/2014   Procedure: BILATERAL LYMPHADENECTOMY;  Surgeon: Alexis Frock, MD;  Location: [facility];  Service: Urology;  Laterality: Bilateral;   POLYPECTOMY  05/23/2020   Procedure: POLYPECTOMY;  Surgeon: Jackquline Denmark, MD;  Location: [facility];  Service: Gastroenterology;;    PROSTATE BIOPSY  12/2013   Gleason 4+3=7, volume 31.31 cc   ROBOT ASSISTED LAPAROSCOPIC RADICAL PROSTATECTOMY N/A 02/27/2014   Procedure: ROBOTIC ASSISTED LAPAROSCOPIC RADICAL PROSTATECTOMY WITH INDOCYANINE GREEN DYE;  Surgeon: Alexis Frock, MD;  Location: [facility];  Service: Urology;  Laterality: N/A;    Vitals:     Subjective Assessment - 12/25/20 0848     Subjective Pt was recently hospitalized on 8/22 due to anemia with orthostatic hypotension issues as well. Pt received transfusion in the hospital and is feeling much better. Goes to PCP tomorrow morning as there was some confusion about time when went yesterday.    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 No/denies                [facility] PT Assessment - 12/25/20 0855       Functional Gait  Assessment   Gait assessed  Yes    Gait Level Surface Walks 20 ft in less than 7 sec but greater than 5.5 sec, uses assistive device, slower speed, mild gait deviations, or deviates 6-10 in outside of the 12 in walkway width.    Change in Gait Speed Able to change speed, demonstrates mild gait deviations, deviates 6-10 in outside of the 12 in walkway width, or no gait deviations, unable to achieve a major change in velocity, or uses a change in velocity, or uses an assistive device.    Gait with Horizontal Head Turns Performs head turns with moderate changes in gait velocity, slows down, deviates 10-15 in outside 12 in walkway width but recovers, can continue to walk.    Gait with Vertical Head Turns Performs task with slight change in gait velocity (eg, minor disruption to smooth gait path), deviates 6 - 10 in outside 12 in walkway width or uses assistive device    Gait and Pivot Turn Pivot  turns safely in greater than 3 sec and stops with no loss of balance, or pivot turns safely within 3 sec and stops with mild imbalance, requires small steps to catch balance.    Step Over Obstacle Is able to step over 2 stacked shoe boxes taped together (9 in total height) without changing gait speed. No evidence of imbalance.    Gait with Narrow Base of Support Ambulates less than 4 steps heel to toe or cannot perform without assistance.    Gait with Eyes Closed Walks 20 ft, uses assistive device, slower speed, mild gait deviations, deviates 6-10 in outside 12 in walkway width. Ambulates 20 ft in less than 9 sec but greater than 7 sec.    Ambulating Backwards Walks 20 ft, uses assistive device, slower speed, mild gait deviations, deviates 6-10 in outside 12 in walkway width.    Steps Alternating feet, must use rail.    Total Score 18  [facility] PT Treatment/Exercise - 12/25/20 0855       Transfers   Transfers Sit to Stand;Stand to Sit    Sit to Stand 5: Supervision    Five time sit to stand comments  16.17 without hands from mat    Stand to Sit 5: Supervision      Ambulation/Gait   Ambulation/Gait Yes    Ambulation/Gait Assistance 5: Supervision;4: Min guard    Ambulation/Gait Assistance Details Pt tends to lean to right at times. Decreased arm swing bilateral. Around in clinic during session.    Assistive device None   walked in to clinic with cane   Gait Pattern Step-through pattern;Decreased step length - right;Decreased step length - left;Decreased arm swing - right;Decreased arm swing - left;Decreased trunk rotation    Ambulation Surface Level;Indoor    Gait velocity 13.3 sec=2.46 ft/sec or 0.77m/s      Therapeutic Activites    Therapeutic Activities Other Therapeutic Activities    Other Therapeutic Activities Fall prevention strategies discussed with pt and wife: pt to change positions slowly to make sure he does not feel lightheaded;  make sure to keep pathways clear with no throw rugs, lights on or night light if has to get up at night.      Neuro Re-ed    Neuro Re-ed Details  Performed exercise at counter as listed below for HEP in [place].             Exercises-performed along counter with verbal cues for form. Walking March - 2 x daily - 7 x weekly - 1 sets - 3-4 reps Side Stepping with Counter Support - 2 x daily - 7 x weekly - 1 sets - 3-4 reps Tandem Walking with Counter Support - 2 x daily - 7 x weekly - 1 sets - 3-4 reps Standing Single Leg Stance with Counter Support - 2 x daily - 7 x weekly - 1 sets - 3 reps - 20sec hold        PT Education - 12/25/20 1300     Education Details Discussed results of testing. Started on home HEP    Person(s) Educated Patient;Other (comment)   niece   Methods Explanation;Demonstration;Handout    Comprehension Verbalized understanding              PT Short Term Goals - 12/25/20 0856       PT SHORT TERM GOAL #1   Title Pt will be independent with initial HEP in order to build up functional gains made in therapy. ALL STGS DUE 12/23/20    Baseline 12/25/20 PT had not started HEP due to health issues prior to hospital stay    Time 4    Period Weeks    Status Not Met    Target Date 12/23/20      PT SHORT TERM GOAL #2   Title Pt will decr 5x sit <> stand time to 18 seconds or less in order to demo improved functional BLE strength.    Baseline 20.88 seconds without UE support. 12/25/20 5 x sit to stand=16.17 sec without hands    Time 4    Period Weeks    Status Achieved      PT SHORT TERM GOAL #3   Title Pt will improve gait speed with no AD to at least 3.3 ft/sec in order to demo improved gait efficiency.    Baseline 2.93 ft/sec. 12/25/20 2.33ft/sec    Time 4    Period Weeks    Status  Not Met      PT SHORT TERM GOAL #4   Title Pt and pt's family will verbalize understanding of fall prevention in the home in order to demo decr fall risk.    Baseline 12/25/20  PT educated on fall prevention strategies around home and pt and his neice verbalized understanding.    Time 4    Period Weeks    Status Achieved      PT SHORT TERM GOAL #5   Title Pt will improve FGA score to at least a 19/30 in order to demo decr fall risk.    Baseline 16/30. 12/25/20 FGA=18/30    Time 4    Period Weeks    Status Partially Met               PT Long Term Goals - 11/25/20 1757       PT LONG TERM GOAL #1   Title Pt will be independent with final HEP in order to build up functional gains made in therapy. ALL LTGS DUE 01/20/21    Time 8    Period Weeks    Status New    Target Date 01/20/21      PT LONG TERM GOAL #2   Title Pt will decr 5x sit <> stand time to 16 seconds or less in order to demo improved functional BLE strength.    Baseline 20.88 seconds without UE support    Time 8    Period Weeks    Status New      PT LONG TERM GOAL #3   Title Pt will ambulate at least 300' outdoors over unlevel surfaces with supervision in order to demo improved community mobility.    Time 8    Period Weeks    Status New      PT LONG TERM GOAL #4   Title Pt will improve FGA score to at least a 22/30 in order to demo decr fall risk.    Baseline 16/30    Time 8    Period Weeks    Status New      PT LONG TERM GOAL #5   Title Pt will improve condition 4 of mCTSIB to at least 20 seconds in order to demo improved vestibular input for balance.    Baseline 7 seconds    Time 8    Period Weeks    Status New                   Plan - 12/25/20 0539     Clinical Impression Statement PT reassessed pt after recent hospital stay. Pt was able to show progress towards STGs meeting 2/5. He had decreased time on 5 x sit to stand and improved 2 points on FGA showing improving balance. Pt did not increase gait speed today. PT was able to initiate standing HEP for home. Pt will continue to benefit from skilled PT to further progress strength, balance and gait.    Personal  Factors and Comorbidities Comorbidity 3+;Past/Current Experience;Time since onset of injury/illness/exacerbation    Comorbidities CVA, alcohol use, diabetes, hyperlipidemia, hypertension, iron deficiency anemia, depression, GERD, prostate cancer    Examination-Activity Limitations Stairs;Transfers;Locomotion Level    Examination-Participation Restrictions Community Activity;Driving    Stability/Clinical Decision Making Stable/Uncomplicated    Rehab Potential Good    PT Frequency 2x / week   12 visits over 8 weeks   PT Duration 8 weeks   12 visits over 8 weeks   PT Treatment/Interventions ADLs/Self Care  Home Management;DME Instruction;Gait training;Stair training;Therapeutic activities;Functional mobility training;Therapeutic exercise;Balance training;Patient/family education;Neuromuscular re-education;Vestibular    PT Next Visit Plan Check BP. Next week may want to schedule further out if think will be recerting end of month as is multi-D. See how HEP is going. Continue with balance training and gait on varied surfaces.    PT Home Exercise Plan Access Code [email or web address]    Consulted and Agree with Plan of Care Patient;Family member/caregiver    Family Member Consulted Sister             Patient will benefit from skilled therapeutic intervention in order to improve the following deficits and impairments:  Abnormal gait, Decreased balance, Decreased strength, Impaired sensation, Postural dysfunction, Difficulty walking, Decreased activity tolerance  Visit Diagnosis: Other abnormalities of gait and mobility  Muscle weakness (generalized)  Unsteadiness on feet     Problem List Patient Active Problem List   Diagnosis Date Noted   Hospital discharge follow-up 12/24/2020   Weakness of both legs    Adenomatous duodenal polyp    AVM (arteriovenous malformation) of duodenum, acquired with hemorrhage    Need for hepatitis C screening test 11/10/2020   Pneumococcal vaccination administered at  current visit 11/10/2020   Healthcare maintenance 11/06/2020   Hypoglycemia 44/04/270   Acute metabolic encephalopathy 53/66/4403   Acute on chronic anemia 10/22/2020   Angiodysplasia of intestine    GI bleed 10/05/2020   Melena    Upper GI bleed    Iron deficiency anemia 05/22/2020   Constipation 05/22/2020   GIB (gastrointestinal bleeding) 05/21/2020   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 ([place])    History of CVA (cerebrovascular accident) 04/23/2020   Hyperlipidemia associated with type 2 diabetes mellitus ([place]) 04/23/2020   Alcohol use 04/23/2020   AKI (acute kidney injury) ([place]) 04/23/2020   Tobacco use 04/23/2020   Unresponsive 11/08/2018   Syncopal seizure ([place]) 11/08/2018   Hypertension associated with diabetes ([place]) 11/08/2018   Alcohol withdrawal seizure with complication, with unspecified complication ([place]) 47/42/5956   Type 2 diabetes mellitus without complication ([place]) 38/75/6433   Symptomatic anemia 04/15/2018   Prostate cancer ([place]) 02/27/2014   Malignant neoplasm of prostate ([place]) 01/09/2014   Bunion 01/17/2013   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    Electa Sniff, PT, DPT, NCS 12/25/2020, 1:04 PM  [facility] [address] [place], [place], [address] Phone: [phone]   Fax:  [phone]  Name: Gregory Nunez. MRN: [id number] Date of Birth: [birth year]

## 2020-12-26 ENCOUNTER — Ambulatory Visit (INDEPENDENT_AMBULATORY_CARE_PROVIDER_SITE_OTHER): Payer: Medicare Other | Admitting: Internal Medicine

## 2020-12-26 ENCOUNTER — Encounter: Payer: Self-pay | Admitting: Internal Medicine

## 2020-12-26 VITALS — BP 136/61 | HR 94 | Temp 98.1°F | Ht 71.0 in | Wt 169.7 lb

## 2020-12-26 DIAGNOSIS — K922 Gastrointestinal hemorrhage, unspecified: Secondary | ICD-10-CM | POA: Diagnosis not present

## 2020-12-26 DIAGNOSIS — R29898 Other symptoms and signs involving the musculoskeletal system: Secondary | ICD-10-CM

## 2020-12-26 DIAGNOSIS — Z8673 Personal history of transient ischemic attack (TIA), and cerebral infarction without residual deficits: Secondary | ICD-10-CM

## 2020-12-26 LAB — BASIC METABOLIC PANEL
Anion gap: 5 (ref 5–15)
BUN: 34 mg/dL — ABNORMAL HIGH (ref 8–23)
CO2: 24 mmol/L (ref 22–32)
Calcium: 8.7 mg/dL — ABNORMAL LOW (ref 8.9–10.3)
Chloride: 109 mmol/L (ref 98–111)
Creatinine, Ser: 1.52 mg/dL — ABNORMAL HIGH (ref 0.61–1.24)
GFR, Estimated: 48 mL/min — ABNORMAL LOW (ref >60)
Glucose, Bld: 172 mg/dL — ABNORMAL HIGH (ref 70–99)
Potassium: 4.3 mmol/L (ref 3.5–5.1)
Sodium: 138 mmol/L (ref 135–145)

## 2020-12-26 LAB — CBC
HCT: 22.9 % — ABNORMAL LOW (ref 39.0–52.0)
Hemoglobin: 7.3 g/dL — ABNORMAL LOW (ref 13.0–17.0)
MCH: 30.9 pg (ref 26.0–34.0)
MCHC: 31.9 g/dL (ref 30.0–36.0)
MCV: 97 fL (ref 80.0–100.0)
Platelets: 198 10*3/uL (ref 150–400)
RBC: 2.36 MIL/uL — ABNORMAL LOW (ref 4.22–5.81)
RDW: 17.6 % — ABNORMAL HIGH (ref 11.5–15.5)
WBC: 3.7 10*3/uL — ABNORMAL LOW (ref 4.0–10.5)
nRBC: 0 % (ref 0.0–0.2)

## 2020-12-26 MED ORDER — DOCUSATE SODIUM 250 MG PO CAPS: 250.0000 mg | ORAL_CAPSULE | Freq: Every day | ORAL | 0 refills | Status: DC

## 2020-12-26 MED ORDER — FERROUS SULFATE 325 (65 FE) MG PO TABS: 325.0000 mg | ORAL_TABLET | Freq: Every day | ORAL | 3 refills | Status: DC

## 2020-12-26 NOTE — Progress Notes (Signed)
CC: hospital f/u and shower chair  HPI:GregoryGregory Nunez. is a 75 y.o. male who presents for evaluation of hospital f/u and shower chair. Please see individual problem based A/P for details.   Please see encounters tab for problem-based charting.  Problem List Items Addressed This Visit       Digestive   Upper GI bleed - Primary   Relevant Orders   CBC no Diff (Completed)   BMP w Anion Gap (STAT/Sunquest-performed on-site) (Completed)   GI bleed    Patient was recently hospitalized for bilateral lower extremity weakness exacerbated by acute blood loss anemia.  Hemoglobin level noted on admission to the hospital was 4.9 and improved to 8.1 on discharge after receiving 2 units PRBCs transfusion during his hospital stay.  GI performed a endoscopy on 8/23 noted angio ectasia without bleed.  GI recommended checking hemoglobin on hospital follow-up and assessing for potential H. pylori infectio  CBC ordered today and the patient has been started on iron supplementation for his anemia.  Instructed patient to hold off on taking his PPI roughly 1 to 2 weeks before he is due to be seen by GI in October.  He has an appointment with GI October 4.  At that time will need to have hemoglobin rechecked for improvement with iron supplementation as well as H. pylori testing.  Today patient was reporting some constipation as well and started him on schedule bowel regimen to help with this since he will be starting iron supplementation as well.        Nervous and Auditory   Weakness of both legs    Gregory Nunez presents  to the clinic today for hospital follow-up for bilateral leg weakness exacerbated by recent symptomatic anemia.  Patient and family were requesting evaluation for DME shower chair as well as possible need for home health.  Patient reports that he has fallen 3 times in the shower as his leg will suddenly give out.  Denies any loss of consciousness.  He is able to get into and out of  the shower independently.  He is also able to wash himself and has no difficulty dressing himself either.  Also able to feed himself and can transfer appropriately without assistance.  He does use a cane but does not always need it.  He is able to walk roughly 1 block. n.  Today he reports that he has noticed some improved strength since his discharge.  He has been working with physical therapy as well.  Care management has been consulted to provide some resources to the patient and his family.  DME shower chair and mobile showerhead have been ordered.  BMP CBC have been ordered as well and will need to be followed up.        Other   History of CVA (cerebrovascular accident)   Relevant Orders   For home use only DME Other see comment   For home use only DME Other see comment   Consult to care management     Depression, PHQ-9: Based on the patients  Gregory Nunez  PHQ-9 Total Score 0      score we have 0.  Past Medical History:  Diagnosis Date   CAO (chronic airflow obstruction) (HCC)    Cataract    OD   CVA (cerebral vascular accident) ([place])    Depression    Diabetes mellitus without complication ([place])    Diabetic retinopathy (  [place])    NPDR OU   GERD (gastroesophageal reflux disease)    if drinks alcohol   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   Patient is approximately 5-week status post bunion correction left foot   Hyperlipidemia    Hypertension    Hypertensive retinopathy    OU   Malignant neoplasm of prostate ([place]) 01/09/2014   Neuropathy    Pancreatitis    Prostate cancer ([place]) 12/19/2013   Gleason 4+3=7, volume 31.31 cc   Sciatica    Shortness of breath dyspnea    with exertion    Review of Systems:   Review of Systems  Constitutional: Negative.   Respiratory: Negative.    Cardiovascular: Negative.   Gastrointestinal:  Positive for constipation.  Musculoskeletal:  Positive for falls.   Neurological: Negative.   Psychiatric/Behavioral: Negative.      Physical Exam: Vitals:   12/26/20 1012  BP: 136/61  Pulse: 94  Temp: 98.1 F (36.7 C)  TempSrc: Oral  SpO2: 100%  Weight: 169 lb 11.2 oz (77 kg)  Height: '5\' 11"'$  (1.803 m)     General: Alert and oriented no acute distress. HEENT: Pallor noted on tongue.  Conjunctiva nl , antiicteric sclerae, moist mucous membranes, no exudate or erythema Cardiovascular: Normal rate, regular rhythm.  No murmurs, rubs, or gallops Pulmonary : Equal breath sounds, No wheezes, rales, or rhonchi Abdominal: soft, nontender,  bowel sounds present Ext: Pallor noted on nailbeds.  No edema in lower extremities, no tenderness to palpation of lower extremities.  Neuro: Strength 5 out of 5 in bilateral upper and lower extremities.  Diminished patellar and Achilles reflexes lower extremities bilaterally.  Assessment & Plan:   See Encounters Tab for problem based charting.  Patient seen with Dr. Jimmye Norman

## 2020-12-26 NOTE — Assessment & Plan Note (Signed)
Patient was recently hospitalized for bilateral lower extremity weakness exacerbated by acute blood loss anemia.  Hemoglobin level noted on admission to the hospital was 4.9 and improved to 8.1 on discharge after receiving 2 units PRBCs transfusion during his hospital stay.  GI performed a endoscopy on 8/23 noted angio ectasia without bleed.  GI recommended checking hemoglobin on hospital follow-up and assessing for potential H. pylori infectio  CBC ordered today and the patient has been started on iron supplementation for his anemia.  Instructed patient to hold off on taking his PPI roughly 1 to 2 weeks before he is due to be seen by GI in October.  He has an appointment with GI October 4.  At that time will need to have hemoglobin rechecked for improvement with iron supplementation as well as H. pylori testing.  Today patient was reporting some constipation as well and started him on schedule bowel regimen to help with this since he will be starting iron supplementation as well.

## 2020-12-26 NOTE — Assessment & Plan Note (Addendum)
Mr. Petrin presents  to the clinic today for hospital follow-up for bilateral leg weakness exacerbated by recent symptomatic anemia.  Patient and family were requesting evaluation for DME shower chair as well as possible need for home health.  Patient reports that he has fallen 3 times in the shower as his leg will suddenly give out.  Denies any loss of consciousness.  He is able to get into and out of the shower independently.  He is also able to wash himself and has no difficulty dressing himself either.  Also able to feed himself and can transfer appropriately without assistance.  He does use a cane but does not always need it.  He is able to walk roughly 1 block. n.  Today he reports that he has noticed some improved strength since his discharge.  He has been working with physical therapy as well.  Care management has been consulted to provide some resources to the patient and his family.  DME shower chair and mobile showerhead have been ordered.  BMP CBC have been ordered as well and will need to be followed up.

## 2020-12-26 NOTE — Patient Instructions (Addendum)
Dear Gregory Nunez,  Thank you for trusting [place] with your care today.   Today we evaluated you after your recent hospitalization.  We have consulted care management for assistance with your care.  We have ordered the DME shower chair and shower head. We have started you on iron supplementation tablets to help with your anemia, as well as a bowel regimen of scheduled colace to help with constipation.  You are scheduled to follow up with GI in October. Please hold off on taking your PPI medication 1-2 weeks before that visit. They will need to evaluate you for H pylori and check your blood cell levels then.

## 2020-12-29 ENCOUNTER — Encounter: Payer: Self-pay | Admitting: Speech Pathology

## 2020-12-29 ENCOUNTER — Encounter: Payer: Self-pay | Admitting: Physical Therapy

## 2020-12-29 ENCOUNTER — Other Ambulatory Visit: Payer: Self-pay

## 2020-12-29 ENCOUNTER — Ambulatory Visit: Payer: Medicare Other | Admitting: Physical Therapy

## 2020-12-29 ENCOUNTER — Ambulatory Visit: Payer: Medicare Other | Admitting: Occupational Therapy

## 2020-12-29 ENCOUNTER — Ambulatory Visit: Payer: Medicare Other | Admitting: Speech Pathology

## 2020-12-29 VITALS — BP 114/60 | HR 96

## 2020-12-29 DIAGNOSIS — R2689 Other abnormalities of gait and mobility: Secondary | ICD-10-CM

## 2020-12-29 DIAGNOSIS — R278 Other lack of coordination: Secondary | ICD-10-CM

## 2020-12-29 DIAGNOSIS — R2681 Unsteadiness on feet: Secondary | ICD-10-CM

## 2020-12-29 DIAGNOSIS — M6281 Muscle weakness (generalized): Secondary | ICD-10-CM

## 2020-12-29 DIAGNOSIS — R41841 Cognitive communication deficit: Secondary | ICD-10-CM

## 2020-12-29 DIAGNOSIS — R4701 Aphasia: Secondary | ICD-10-CM

## 2020-12-29 NOTE — Therapy (Addendum)
[facility] [address] [place], [place], [address] Phone: [phone]   Fax:  [phone]  Physical Therapy Treatment  Patient Details  Name: Gregory Nunez. MRN: [id number] Date of Birth: 03/30/1946 Referring Provider (PT): Velna Ochs, MD   Encounter Date: 12/29/2020   PT End of Session - 12/29/20 0933     Visit Number 4    Number of Visits 13    Date for PT Re-Evaluation 01/24/21    Authorization Type [employer] Medicare    PT Start Time 2542    PT Stop Time 0929    PT Time Calculation (min) 42 min    Equipment Utilized During Treatment Gait belt    Activity Tolerance Treatment limited secondary to medical complications (Comment)   limited by low BP   Behavior During Therapy WFL for tasks assessed/performed             Past Medical History:  Diagnosis Date   CAO (chronic airflow obstruction) (HCC)    Cataract    OD   CVA (cerebral vascular accident) ([place])    Depression    Diabetes mellitus without complication ([place])    Diabetic retinopathy ([place])    NPDR OU   GERD (gastroesophageal reflux disease)    if drinks alcohol   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   Patient is approximately 5-week status post bunion correction left foot   Hyperlipidemia    Hypertension    Hypertensive retinopathy    OU   Malignant neoplasm of prostate ([place]) 01/09/2014   Neuropathy    Pancreatitis    Prostate cancer ([place]) 12/19/2013   Gleason 4+3=7, volume 31.31 cc   Sciatica    Shortness of breath dyspnea    with exertion     Past Surgical History:  Procedure Laterality Date   BIOPSY  04/16/2018   Procedure: BIOPSY;  Surgeon: Juanita Craver, MD;  Location: [facility];  Service: Endoscopy;;   BIOPSY  05/23/2020   Procedure: BIOPSY;  Surgeon: Jackquline Denmark, MD;  Location: [facility];  Service: Gastroenterology;;  EGD and COLON   BIOPSY  12/09/2020   Procedure: BIOPSY;  Surgeon: Thornton Park, MD;   Location: [facility];  Service: Gastroenterology;;   biopsy on throat     hx of    CATARACT EXTRACTION Bilateral    Dr. Quentin Ore   COLONOSCOPY N/A 05/23/2020   Procedure: COLONOSCOPY;  Surgeon: Jackquline Denmark, MD;  Location: [facility];  Service: Gastroenterology;  Laterality: N/A;   ENTEROSCOPY N/A 10/07/2020   Procedure: ENTEROSCOPY;  Surgeon: [name], MD;  Location: [facility];  Service: Endoscopy;  Laterality: N/A;   ESOPHAGOGASTRODUODENOSCOPY Left 04/16/2018   Procedure: ESOPHAGOGASTRODUODENOSCOPY (EGD);  Surgeon: Juanita Craver, MD;  Location: [facility];  Service: Endoscopy;  Laterality: Left;   ESOPHAGOGASTRODUODENOSCOPY (EGD) WITH PROPOFOL N/A 05/23/2020   Procedure: ESOPHAGOGASTRODUODENOSCOPY (EGD) WITH PROPOFOL;  Surgeon: Jackquline Denmark, MD;  Location: [facility];  Service: Gastroenterology;  Laterality: N/A;   ESOPHAGOGASTRODUODENOSCOPY (EGD) WITH PROPOFOL N/A 12/09/2020   Procedure: ESOPHAGOGASTRODUODENOSCOPY (EGD) WITH PROPOFOL;  Surgeon: Thornton Park, MD;  Location: [facility];  Service: Gastroenterology;  Laterality: N/A;   EYE SURGERY     FOOT SURGERY     HOT HEMOSTASIS N/A 04/16/2018   Procedure: HOT HEMOSTASIS (ARGON PLASMA COAGULATION/BICAP);  Surgeon: Juanita Craver, MD;  Location: [facility];  Service: Endoscopy;  Laterality: N/A;   HOT HEMOSTASIS N/A 05/23/2020   Procedure: HOT HEMOSTASIS (ARGON PLASMA COAGULATION/BICAP);  Surgeon: Jackquline Denmark, MD;  Location:  [facility];  Service: Gastroenterology;  Laterality: N/A;   HOT HEMOSTASIS N/A 12/09/2020   Procedure: HOT HEMOSTASIS (ARGON PLASMA COAGULATION/BICAP);  Surgeon: Thornton Park, MD;  Location: [facility];  Service: Gastroenterology;  Laterality: N/A;   LYMPHADENECTOMY Bilateral 02/27/2014   Procedure: BILATERAL LYMPHADENECTOMY;  Surgeon: Alexis Frock, MD;  Location: [facility];  Service: Urology;  Laterality: Bilateral;   POLYPECTOMY  05/23/2020   Procedure: POLYPECTOMY;  Surgeon: Jackquline Denmark, MD;  Location: [facility];  Service: Gastroenterology;;   PROSTATE BIOPSY  12/2013   Gleason 4+3=7, volume 31.31 cc   ROBOT ASSISTED LAPAROSCOPIC RADICAL PROSTATECTOMY N/A 02/27/2014   Procedure: ROBOTIC ASSISTED LAPAROSCOPIC RADICAL PROSTATECTOMY WITH INDOCYANINE GREEN DYE;  Surgeon: Alexis Frock, MD;  Location: [facility];  Service: Urology;  Laterality: N/A;    Vitals:   12/29/20 0854 12/29/20 0905 12/29/20 0922 12/29/20 0926  BP: 134/62 129/61 (!) 104/47 114/60  Pulse: 97 99 (!) 104 96     Subjective Assessment - 12/29/20 0850     Subjective Has an eye appt today. Exercises are going well at home.    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 No/denies                               [facility] Adult PT Treatment/Exercise - 12/29/20 0001       Ambulation/Gait   Ambulation/Gait Yes    Ambulation/Gait Assistance 4: Min guard    Ambulation/Gait Assistance Details pt ambulated in with no AD with therapist providing min guard, pt's niece went out to the car and brought in pt's cane at end of session for pt to walk towards speech therapy    Assistive device None;Straight cane    Gait Pattern Step-through pattern;Decreased step length - right;Decreased step length - left;Decreased arm swing - right;Decreased arm swing - left;Decreased trunk rotation    Ambulation Surface Level;Indoor      Therapeutic Activites    Therapeutic Activities Other Therapeutic Activities    Other Therapeutic Activities Due to orthostatics today, educated on importance of slow transitions esp from sit > stand and to stand for a few seconds before beginning to walk, drinking plenty of water, and performing seated marching/heel <> toe raises  prior to standing for incr circulation                 Balance Exercises - 12/29/20 0906       Balance Exercises: Standing   Standing Eyes Opened Narrow base of support (BOS);Limitations    Standing Eyes Opened Limitations feet together on level ground: x10 reps head turns, x10 reps head nods, on blue air ex with feet apart: x10 reps head nods, x10 reps head turns    Standing Eyes Closed Wide (BOA);Foam/compliant surface;Limitations    Standing Eyes Closed Limitations wider BOS then feet hip width on blue air ex 3 x 30 seconds    SLS Eyes open;Limitations    SLS Limitations alternating step taps to 6" step, x12 reps bilat, beginning with UE support > none, incr difficulty with SLS on RLE    Step Ups Forward;6 inch;UE support 2;Limitations    Step Ups Limitations x5 reps bilat, pt reporting incr fatigue/dizziness and needing to  sit and rest    Sit to Stand Foam/compliant surface;Limitations    Sit to Stand Limitations blue air ex no UE support 2 x 5 reps, performed at edge of mat table                PT Education - 12/29/20 0932     Education Details see therapeutic activity    Person(s) Educated Patient   family member   Methods Explanation    Comprehension Verbalized understanding              PT Short Term Goals - 12/25/20 0856       PT SHORT TERM GOAL #1   Title Pt will be independent with initial HEP in order to build up functional gains made in therapy. ALL STGS DUE 12/23/20    Baseline 12/25/20 PT had not started HEP due to health issues prior to hospital stay    Time 4    Period Weeks    Status Not Met    Target Date 12/23/20      PT SHORT TERM GOAL #2   Title Pt will decr 5x sit <> stand time to 18 seconds or less in order to demo improved functional BLE strength.    Baseline 20.88 seconds without UE support. 12/25/20 5 x sit to stand=16.17 sec without hands    Time 4    Period Weeks    Status Achieved      PT SHORT TERM GOAL #3   Title Pt will  improve gait speed with no AD to at least 3.3 ft/sec in order to demo improved gait efficiency.    Baseline 2.93 ft/sec. 12/25/20 2.63f/sec    Time 4    Period Weeks    Status Not Met      PT SHORT TERM GOAL #4   Title Pt and pt's family will verbalize understanding of fall prevention in the home in order to demo decr fall risk.    Baseline 12/25/20 PT educated on fall prevention strategies around home and pt and his neice verbalized understanding.    Time 4    Period Weeks    Status Achieved      PT SHORT TERM GOAL #5   Title Pt will improve FGA score to at least a 19/30 in order to demo decr fall risk.    Baseline 16/30. 12/25/20 FGA=18/30    Time 4    Period Weeks    Status Partially Met               PT Long Term Goals - 11/25/20 1757       PT LONG TERM GOAL #1   Title Pt will be independent with final HEP in order to build up functional gains made in therapy. ALL LTGS DUE 01/20/21    Time 8    Period Weeks    Status New    Target Date 01/20/21      PT LONG TERM GOAL #2   Title Pt will decr 5x sit <> stand time to 16 seconds or less in order to demo improved functional BLE strength.    Baseline 20.88 seconds without UE support    Time 8    Period Weeks    Status New      PT LONG TERM GOAL #3   Title Pt will ambulate at least 300' outdoors over unlevel surfaces with supervision in order to demo improved community mobility.    Time 8    Period  Weeks    Status New      PT LONG TERM GOAL #4   Title Pt will improve FGA score to at least a 22/30 in order to demo decr fall risk.    Baseline 16/30    Time 8    Period Weeks    Status New      PT LONG TERM GOAL #5   Title Pt will improve condition 4 of mCTSIB to at least 20 seconds in order to demo improved vestibular input for balance.    Baseline 7 seconds    Time 8    Period Weeks    Status New                   Plan - 12/29/20 1118     Clinical Impression Statement Today's skilled session focused  on balance strategies with decr UE support and BLE strengthening. Session limited due to pt experience dizziness and orthostatics after step ups/SLS activities in the // bars. Sat pt down for an extended rest break with water provided and pt's BP increased and pt no longer feeling dizzy. Discussed continuing to monitor pt's BP at home. Will continue to progress towards LTGs.    Personal Factors and Comorbidities Comorbidity 3+;Past/Current Experience;Time since onset of injury/illness/exacerbation    Comorbidities CVA, alcohol use, diabetes, hyperlipidemia, hypertension, iron deficiency anemia, depression, GERD, prostate cancer    Examination-Activity Limitations Stairs;Transfers;Locomotion Level    Examination-Participation Restrictions Community Activity;Driving    Stability/Clinical Decision Making Stable/Uncomplicated    Rehab Potential Good    PT Frequency 2x / week   12 visits over 8 weeks   PT Duration 8 weeks   12 visits over 8 weeks   PT Treatment/Interventions ADLs/Self Care Home Management;DME Instruction;Gait training;Stair training;Therapeutic activities;Functional mobility training;Therapeutic exercise;Balance training;Patient/family education;Neuromuscular re-education;Vestibular    PT Next Visit Plan Check BP - pt still orthostatic. Next week may want to schedule further out if think will be recerting end of month as is multi-D.how is HEP? Marland Kitchen Continue with balance training and gait on varied surfaces. BLE strengthening, SLS tasks.    PT Home Exercise Plan Access Code [email or web address]    Consulted and Agree with Plan of Care Patient;Family member/caregiver    Family Member Consulted Sister             Patient will benefit from skilled therapeutic intervention in order to improve the following deficits and impairments:  Abnormal gait, Decreased balance, Decreased strength, Impaired sensation, Postural dysfunction, Difficulty walking, Decreased activity tolerance  Visit Diagnosis: Other  abnormalities of gait and mobility  Muscle weakness (generalized)  Unsteadiness on feet  Other lack of coordination     Problem List Patient Active Problem List   Diagnosis Date Noted   Hospital discharge follow-up 12/24/2020   Weakness of both legs    Adenomatous duodenal polyp    AVM (arteriovenous malformation) of duodenum, acquired with hemorrhage    Need for hepatitis C screening test 11/10/2020   Pneumococcal vaccination administered at current visit 11/10/2020   Healthcare maintenance 11/06/2020   Hypoglycemia 93/90/3009   Acute metabolic encephalopathy 23/30/0762   Acute on chronic anemia 10/22/2020   Angiodysplasia of intestine    GI bleed 10/05/2020   Melena    Upper GI bleed    Iron deficiency anemia 05/22/2020   Constipation 05/22/2020   GIB (gastrointestinal bleeding) 05/21/2020   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 ([place])    History of CVA (cerebrovascular accident) 04/23/2020   Hyperlipidemia  associated with type 2 diabetes mellitus ([place]) 04/23/2020   Alcohol use 04/23/2020   AKI (acute kidney injury) ([place]) 04/23/2020   Tobacco use 04/23/2020   Unresponsive 11/08/2018   Syncopal seizure ([place]) 11/08/2018   Hypertension associated with diabetes ([place]) 11/08/2018   Alcohol withdrawal seizure with complication, with unspecified complication ([place]) 68/02/5725   Type 2 diabetes mellitus without complication ([place]) 20/35/5974   Symptomatic anemia 04/15/2018   Prostate cancer ([place]) 02/27/2014   Malignant neoplasm of prostate ([place]) 01/09/2014   Bunion 01/17/2013   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    Arliss Journey, PT, DPT  12/29/2020, 11:22 AM  [facility] [address] [place], [place], [address] Phone: [phone]   Fax:  [phone]  Name: Gregory Nunez. MRN: [id number] Date of Birth: [birth year]

## 2020-12-29 NOTE — Therapy (Signed)
[facility] [address] [place], [place], [address] Phone: [phone]   Fax:  [phone]  Speech Language Pathology Treatment  Patient Details  Name: Gregory Nunez. MRN: [id number] Date of Birth: [birth year] Referring Provider (SLP): Dr. Reymundo Poll   Encounter Date: 12/29/2020   End of Session - 12/29/20 1007     Visit Number 6    Number of Visits 25    Date for SLP Re-Evaluation 02/09/21             Past Medical History:  Diagnosis Date   CAO (chronic airflow obstruction) (HCC)    Cataract    OD   CVA (cerebral vascular accident) (HCC)    Depression    Diabetes mellitus without complication (HCC)    Diabetic retinopathy (HCC)    NPDR OU   GERD (gastroesophageal reflux disease)    if drinks alcohol   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   Patient is approximately 5-week status post bunion correction left foot   Hyperlipidemia    Hypertension    Hypertensive retinopathy    OU   Malignant neoplasm of prostate (HCC) 01/09/2014   Neuropathy    Pancreatitis    Prostate cancer (HCC) 12/19/2013   Gleason 4+3=7, volume 31.31 cc   Sciatica    Shortness of breath dyspnea    with exertion     Past Surgical History:  Procedure Laterality Date   BIOPSY  04/16/2018   Procedure: BIOPSY;  Surgeon: Charna Elizabeth, MD;  Location: [facility] ENDOSCOPY;  Service: Endoscopy;;   BIOPSY  05/23/2020   Procedure: BIOPSY;  Surgeon: Lynann Bologna, MD;  Location: [facility];  Service: Gastroenterology;;  EGD and COLON   BIOPSY  12/09/2020   Procedure: BIOPSY;  Surgeon: Tressia Danas, MD;  Location: [facility] ENDOSCOPY;  Service: Gastroenterology;;   biopsy on throat     hx of    CATARACT EXTRACTION Bilateral    Dr. Baker Pierini   COLONOSCOPY N/A 05/23/2020   Procedure: COLONOSCOPY;  Surgeon: Lynann Bologna, MD;  Location: [facility];  Service: Gastroenterology;  Laterality: N/A;   ENTEROSCOPY N/A 10/07/2020    Procedure: ENTEROSCOPY;  Surgeon: Iva Boop, MD;  Location: [facility] ENDOSCOPY;  Service: Endoscopy;  Laterality: N/A;   ESOPHAGOGASTRODUODENOSCOPY Left 04/16/2018   Procedure: ESOPHAGOGASTRODUODENOSCOPY (EGD);  Surgeon: Charna Elizabeth, MD;  Location: [facility]COPY;  Service: Endoscopy;  Laterality: Left;   ESOPHAGOGASTRODUODENOSCOPY (EGD) WITH PROPOFOL N/A 05/23/2020   Procedure: ESOPHAGOGASTRODUODENOSCOPY (EGD) WITH PROPOFOL;  Surgeon: Lynann Bologna, MD;  Location: [facility];  Service: Gastroenterology;  Laterality: N/A;   ESOPHAGOGASTRODUODENOSCOPY (EGD) WITH PROPOFOL N/A 12/09/2020   Procedure: ESOPHAGOGASTRODUODENOSCOPY (EGD) WITH PROPOFOL;  Surgeon: Tressia Danas, MD;  Location: [facility] ENDOSCOPY;  Service: Gastroenterology;  Laterality: N/A;   EYE SURGERY     FOOT SURGERY     HOT HEMOSTASIS N/A 04/16/2018   Procedure: HOT HEMOSTASIS (ARGON PLASMA COAGULATION/BICAP);  Surgeon: Charna Elizabeth, MD;  Location: [facility];  Service: Endoscopy;  Laterality: N/A;   HOT HEMOSTASIS N/A 05/23/2020   Procedure: HOT HEMOSTASIS (ARGON PLASMA COAGULATION/BICAP);  Surgeon: Lynann Bologna, MD;  Location: [facility];  Service: Gastroenterology;  Laterality: N/A;   HOT HEMOSTASIS N/A 12/09/2020   Procedure: HOT HEMOSTASIS (ARGON PLASMA COAGULATION/BICAP);  Surgeon: Tressia Danas, MD;  Location: [facility] ENDOSCOPY;  Service: Gastroenterology;  Laterality: N/A;   LYMPHADENECTOMY Bilateral 02/27/2014   Procedure: BILATERAL LYMPHADENECTOMY;  Surgeon: Sebastian Ache, MD;  Location: [facility];  Service: Urology;  Laterality: Bilateral;   POLYPECTOMY  05/23/2020   Procedure: POLYPECTOMY;  Surgeon: Jackquline Denmark, MD;  Location: [facility]OS[facility];  Service: Gastroenterology;;   PROSTATE BIOPSY  12/2013   Gleason 4+3=7, volume 31.31 cc   ROBOT ASSISTED LAPAROSCOPIC RADICAL PROSTATECTOMY N/A 02/27/2014   Procedure: ROBOTIC ASSISTED LAPAROSCOPIC RADICAL PROSTATECTOMY WITH INDOCYANINE GREEN DYE;  Surgeon: Alexis Frock, MD;  Location:  [facility];  Service: Urology;  Laterality: N/A;    There were no vitals filed for this visit.   Subjective Assessment - 12/29/20 0931     Subjective "We left the HW on the kitchen table"    Patient is accompained by: Family member   Pam   Currently in Pain? No/denies                   ADULT SLP TREATMENT - 12/29/20 0933       General Information   Behavior/Cognition Alert;Cooperative;Pleasant mood      Treatment Provided   Treatment provided Cognitive-Linquistic      Cognitive-Linquistic Treatment   Treatment focused on Aphasia;Cognition;Patient/family/caregiver education    Skilled Treatment Ricky reports he has done some light cooking and he has recalled am meds 90% subjectively as judged by sister Pam. He verbalized 3 OT/PT HEP's and reports he is doing them daily, He is putting away his clothes - instructed Pam to have him fold his clothes. Word finding, attention targeted with divergent naming, IDing word that doesn't belong and category of words that go together. [name] ID'd word that didn't belong 7/10 with extended time and mod I - he had to re-read 4/10. He required min verbal and quetioning cues to ID remaiining 3/10 that didn't belong. He named category of words 6/10 with exteneded time, 1/10 with written cues and remianing 3/10 with questioning cues. Overall slow processing with 2 redirections to task.  1 episode of perseveration reading "celery/sarah" from prior question. He required verbal cues to ID error and mod A to self correct.  He had to leave session 15 minutes early for MD appointment - eye doctor. HW provided.      Assessment / Recommendations / Plan   Plan Continue with current plan of care      Progression Toward Goals   Progression toward goals Progressing toward goals              SLP Education - 12/29/20 1004     Education Details increrase participation in household chores    Person(s) Educated Patient;Caregiver(s)    Methods Explanation     Comprehension Verbalized understanding;Verbal cues required              SLP Short Term Goals - 12/29/20 1005       SLP SHORT TERM GOAL #1   Title Pt will use external aids to recall am meds with 3 or less reminders from family over 1 week    Baseline family remidning his consistently every day    Time 1    Period Weeks    Status Not Met      SLP SHORT TERM GOAL #2   Title Gaege will carryover 2 compensations for memory and attention to complete 3 house hold chores a week with occasiona min A from family    Baseline not completing house hold chores    Time 1    Period Weeks    Status Not Met      SLP SHORT TERM GOAL #3   Title 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 18/20 sentences  with occasional min A    Time 1    Period Weeks    Status Not Met      SLP SHORT TERM GOAL #4   Title Pt will name 10 items for personally relevant category using verbal compensations as needed with occasional min A over 2 sessions    Time 1    Period Weeks    Status Not Met      SLP SHORT TERM GOAL #5   Title Pt will fill out 2 money orders to pay bills with occasional min A from family    Baseline Nephew is paying his bills for him    Time 1    Period Weeks    Status Not Met              SLP Long Term Goals - 12/29/20 1005       SLP LONG TERM GOAL #1   Title Pt will use external aids and compensations to manage his medications, appointments and daily to do list with occasional min A from family    Baseline Family managing meds and appointments    Time 8    Period Weeks    Status On-going      SLP LONG TERM GOAL #2   Title 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 over 15 minute conversation to be 95% intelligible with occasional min A    Time 8    Period Weeks    Status On-going      SLP LONG TERM GOAL #3   Title Pt will ID aphasic errors in simple conversation with 3 or less verbal cues and attempt to self correct errors     Baseline Pt unaware of errors    Time 8    Period Weeks    Status On-going      SLP LONG TERM GOAL #4   Title Pt will use organization system, calendar to pay 3 bills on time with occasional min A from family.    Baseline family managing his finances    Time 8    Period Weeks    Status On-going      SLP LONG TERM GOAL #5   Title Family will rate Ahamed' communciation on The Communicative Participation Item [employer] General Short Form a 12 or higher    Baseline rated a score of 9    Time 8    Period Weeks    Status On-going      SLP LONG TERM GOAL #6   Title Pt will carryover 3 strategies for attention and memory to cook3  simple meals with supervision from family    Baseline not cooking    Time 8    Period Weeks    Status On-going              Plan - 12/29/20 1005     Clinical Impression Statement Moderate aphasia and cognitive impairments including memory, awareness, executive function, and attention. Word finding in structured tasks required mod A. Initiated training in compensatory strategies for cognition and aphasia, as well as cognitive and language activities to do at home. Pt continues to required supervision/family A for IADL's. Continue skilled ST to maximize cognition and language for improved independence, safety and pt's goal to return to his home independently.    Speech Therapy Frequency 2x / week    Duration 12 weeks    Treatment/Interventions Cueing hierarchy;Environmental controls;Language facilitation;SLP instruction and feedback;Compensatory strategies;Functional tasks;Cognitive reorganization;Compensatory techniques;Internal/external aids;Multimodal communcation approach;Patient/family education  Potential to Achieve Goals Fair    Potential Considerations Co-morbidities;Previous level of function;Cooperation/participation level             Patient will benefit from skilled therapeutic intervention in order to improve the following deficits and  impairments:   Cognitive communication deficit  Aphasia    Problem List Patient Active Problem List   Diagnosis Date Noted   Hospital discharge follow-up 12/24/2020   Weakness of both legs    Adenomatous duodenal polyp    AVM (arteriovenous malformation) of duodenum, acquired with hemorrhage    Need for hepatitis C screening test 11/10/2020   Pneumococcal vaccination administered at current visit 11/10/2020   Healthcare maintenance 11/06/2020   Hypoglycemia 41/44/3601   Acute metabolic encephalopathy 65/80/0634   Acute on chronic anemia 10/22/2020   Angiodysplasia of intestine    GI bleed 10/05/2020   Melena    Upper GI bleed    Iron deficiency anemia 05/22/2020   Constipation 05/22/2020   GIB (gastrointestinal bleeding) 05/21/2020   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 ([place])    History of CVA (cerebrovascular accident) 04/23/2020   Hyperlipidemia associated with type 2 diabetes mellitus ([place]) 04/23/2020   Alcohol use 04/23/2020   AKI (acute kidney injury) ([place]) 04/23/2020   Tobacco use 04/23/2020   Unresponsive 11/08/2018   Syncopal seizure ([place]) 11/08/2018   Hypertension associated with diabetes ([place]) 11/08/2018   Alcohol withdrawal seizure with complication, with unspecified complication ([place]) 94/94/4739   Type 2 diabetes mellitus without complication ([place]) 58/44/1712   Symptomatic anemia 04/15/2018   Prostate cancer ([place]) 02/27/2014   Malignant neoplasm of prostate ([place]) 01/09/2014   Bunion 01/17/2013   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    Martavis Gurney, Annye Rusk MS, CCC-SLP 12/29/2020, 10:08 AM  [facility] [address] [place], [place], [address] Phone: [phone]   Fax:  [phone]   Name: Melchor Kirchgessner. MRN: [id number] Date of Birth: Sep 05, 1945

## 2020-12-30 ENCOUNTER — Telehealth: Payer: Self-pay

## 2020-12-30 NOTE — Telephone Encounter (Signed)
Myriam Jacobson, Mamie C, [place]; Circle, [place]; Kathleen, Coral Spikes; Nash Shearer; 2 others  Received! Thank you!

## 2020-12-30 NOTE — Telephone Encounter (Signed)
I have sent a community message to [facility] for a shower chair and a shower head as per ordered by the [facility] department

## 2020-12-31 ENCOUNTER — Encounter: Payer: Self-pay | Admitting: Speech Pathology

## 2020-12-31 ENCOUNTER — Telehealth: Payer: Self-pay | Admitting: Physical Therapy

## 2020-12-31 ENCOUNTER — Ambulatory Visit: Payer: Medicare Other | Admitting: Speech Pathology

## 2020-12-31 ENCOUNTER — Other Ambulatory Visit: Payer: Self-pay

## 2020-12-31 ENCOUNTER — Ambulatory Visit: Payer: Medicare Other | Admitting: Physical Therapy

## 2020-12-31 ENCOUNTER — Encounter: Payer: Self-pay | Admitting: Physical Therapy

## 2020-12-31 ENCOUNTER — Ambulatory Visit: Payer: Medicare Other | Admitting: Occupational Therapy

## 2020-12-31 VITALS — BP 112/57 | HR 92

## 2020-12-31 DIAGNOSIS — R2689 Other abnormalities of gait and mobility: Secondary | ICD-10-CM

## 2020-12-31 DIAGNOSIS — R278 Other lack of coordination: Secondary | ICD-10-CM

## 2020-12-31 DIAGNOSIS — R4701 Aphasia: Secondary | ICD-10-CM

## 2020-12-31 DIAGNOSIS — M6281 Muscle weakness (generalized): Secondary | ICD-10-CM

## 2020-12-31 DIAGNOSIS — R2681 Unsteadiness on feet: Secondary | ICD-10-CM

## 2020-12-31 DIAGNOSIS — R41841 Cognitive communication deficit: Secondary | ICD-10-CM

## 2020-12-31 NOTE — Therapy (Addendum)
[facility] [address] [place], [place], [address] Phone: [phone]   Fax:  [phone]  Physical Therapy Treatment  Patient Details  Name: Gregory Nunez. MRN: [id number] Date of Birth: May 03, 1945 Referring Provider (PT): Velna Ochs, MD   Encounter Date: 12/31/2020   PT End of Session - 12/31/20 1021     Visit Number 5    Number of Visits 13    Date for PT Re-Evaluation 01/24/21    Authorization Type UHC Medicare    PT Start Time 1018    PT Stop Time 1058    PT Time Calculation (min) 40 min    Equipment Utilized During Treatment Gait belt    Activity Tolerance Treatment limited secondary to medical complications (Comment)   limited by low BP   Behavior During Therapy WFL for tasks assessed/performed             Past Medical History:  Diagnosis Date   CAO (chronic airflow obstruction) (HCC)    Cataract    OD   CVA (cerebral vascular accident) ([place])    Depression    Diabetes mellitus without complication ([place])    Diabetic retinopathy ([place])    NPDR OU   GERD (gastroesophageal reflux disease)    if drinks alcohol   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   Patient is approximately 5-week status post bunion correction left foot   Hyperlipidemia    Hypertension    Hypertensive retinopathy    OU   Malignant neoplasm of prostate ([place]) 01/09/2014   Neuropathy    Pancreatitis    Prostate cancer ([place]) 12/19/2013   Gleason 4+3=7, volume 31.31 cc   Sciatica    Shortness of breath dyspnea    with exertion     Past Surgical History:  Procedure Laterality Date   BIOPSY  04/16/2018   Procedure: BIOPSY;  Surgeon: Juanita Craver, MD;  [facility];  Service: Endoscopy;;   BIOPSY  05/23/2020   Procedure: BIOPSY;  Surgeon: Jackquline Denmark, MD;  [facility];  Service: Gastroenterology;;  EGD and COLON   BIOPSY  12/09/2020   Procedure: BIOPSY;  Surgeon: Thornton Park, MD;   Location: [facility];  Service: Gastroenterology;;   biopsy on throat     hx of    CATARACT EXTRACTION Bilateral    Dr. Quentin Ore   COLONOSCOPY N/A 05/23/2020   Procedure: COLONOSCOPY;  Surgeon: Jackquline Denmark, MD;  [facility];  Service: Gastroenterology;  Laterality: N/A;   ENTEROSCOPY N/A 10/07/2020   Procedure: ENTEROSCOPY;  Surgeon: [name], MD;  Location: [facility];  Service: Endoscopy;  Laterality: N/A;   ESOPHAGOGASTRODUODENOSCOPY Left 04/16/2018   Procedure: ESOPHAGOGASTRODUODENOSCOPY (EGD);  Surgeon: Juanita Craver, MD;  Location: [facility];  Service: Endoscopy;  Laterality: Left;   ESOPHAGOGASTRODUODENOSCOPY (EGD) WITH PROPOFOL N/A 05/23/2020   Procedure: ESOPHAGOGASTRODUODENOSCOPY (EGD) WITH PROPOFOL;  Surgeon: Jackquline Denmark, MD;  [facility];  Service: Gastroenterology;  Laterality: N/A;   ESOPHAGOGASTRODUODENOSCOPY (EGD) WITH PROPOFOL N/A 12/09/2020   Procedure: ESOPHAGOGASTRODUODENOSCOPY (EGD) WITH PROPOFOL;  Surgeon: Thornton Park, MD;  Location: [facility];  Service: Gastroenterology;  Laterality: N/A;   EYE SURGERY     FOOT SURGERY     HOT HEMOSTASIS N/A 04/16/2018   Procedure: HOT HEMOSTASIS (ARGON PLASMA COAGULATION/BICAP);  Surgeon: Juanita Craver, MD;  Location: [facility];  Service: Endoscopy;  Laterality: N/A;   HOT HEMOSTASIS N/A 05/23/2020   Procedure: HOT HEMOSTASIS (ARGON PLASMA COAGULATION/BICAP);  Surgeon: Jackquline Denmark, MD;  [facility];  Service: Gastroenterology;  Laterality: N/A;   HOT HEMOSTASIS N/A 12/09/2020   Procedure: HOT HEMOSTASIS (ARGON PLASMA COAGULATION/BICAP);  Surgeon: Thornton Park, MD;  Location: [facility];  Service: Gastroenterology;  Laterality: N/A;   LYMPHADENECTOMY Bilateral 02/27/2014   Procedure: BILATERAL LYMPHADENECTOMY;  Surgeon: Alexis Frock, MD;  [facility];  Service: Urology;  Laterality: Bilateral;   POLYPECTOMY  05/23/2020   Procedure: POLYPECTOMY;  Surgeon: Jackquline Denmark, MD;  [facility];  Service: Gastroenterology;;   PROSTATE BIOPSY  12/2013   Gleason 4+3=7, volume 31.31 cc   ROBOT ASSISTED LAPAROSCOPIC RADICAL PROSTATECTOMY N/A 02/27/2014   Procedure: ROBOTIC ASSISTED LAPAROSCOPIC RADICAL PROSTATECTOMY WITH INDOCYANINE GREEN DYE;  Surgeon: Alexis Frock, MD;  [facility];  Service: Urology;  Laterality: N/A;    Vitals:   12/31/20 1023 12/31/20 1034 12/31/20 1046 12/31/20 1052  BP: 128/65 127/61 (!) 107/34 (!) 112/57  Pulse: 94 100 (!) 108 92     Subjective Assessment - 12/31/20 1021     Subjective No falls, feeling better today.    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 No/denies                               [facility] Adult PT Treatment/Exercise - 12/31/20 1028       Ambulation/Gait   Ambulation/Gait Yes    Ambulation/Gait Assistance 4: Min guard    Ambulation/Gait Assistance Details with pt's SPC, cues for placement and sequencing - bringing cane forward with the L foot and keeping it out to the side (pt with tendency to place too narrow in front of R foot). needs consistent cues throughout    Ambulation Distance (Feet) 230 Feet   x1   Assistive device None;Straight cane    Gait Pattern Step-through pattern;Decreased step length - right;Decreased step length - left;Decreased arm swing - right;Decreased arm swing - left;Decreased trunk rotation    Ambulation Surface Level;Indoor      Therapeutic Activites    Therapeutic Activities Other Therapeutic Activities    Other Therapeutic Activities Pt continued with orthostatic BP values today after standing activity with pt reporting dizziness. Pt needing a prolonged seated rest break and BP able to incr with  break and having legs elevated/pt drinking water. PT messaged pt's PCP regarding BP values during last 2 sessions, discussed with pt's niece to also call his PCP  today and let them know so he can be seen earlier (instead of next follow up in 30 days). Wheeled pt out at end of session in manual clinic w/c for safety                 Balance Exercises - 12/31/20 0001       Balance Exercises: Standing   SLS Eyes open;Limitations    SLS Limitations alternating SLS taps to 2 cones with intermittent UE support x10 reps bilat, plus an additional x5 reps bilat with forward and then cross body cone tap with UE support - needs verbal cues for sequencing    Step Ups Forward;4 inch;Limitations    Step Ups Limitations x10 reps bilat with no UE support, cues for alternating legs and tall posture when on step. pt needing  one episode of mod A for balance  due to pt not stepping his foot all the way off step.    Retro Gait 3 reps;Limitations    Retro Gait Limitations in // bars, no UE support, cues for step length    Marching Upper extremity assist 1;Forwards;Limitations    Marching Limitations cues for step height and slowed and controlled for SLS                PT Education - 12/31/20 1059     Education Details see therapeutic activity session, scheduling more appts throughout end of october for all 3 disciplines.    Person(s) Educated Patient   pt's niece   Methods Explanation    Comprehension Verbalized understanding              PT Short Term Goals - 12/25/20 0856       PT SHORT TERM GOAL #1   Title Pt will be independent with initial HEP in order to build up functional gains made in therapy. ALL STGS DUE 12/23/20    Baseline 12/25/20 PT had not started HEP due to health issues prior to hospital stay    Time 4    Period Weeks    Status Not Met    Target Date 12/23/20      PT SHORT TERM GOAL #2   Title Pt will decr 5x sit <> stand time to 18 seconds or less in order to demo  improved functional BLE strength.    Baseline 20.88 seconds without UE support. 12/25/20 5 x sit to stand=16.17 sec without hands    Time 4    Period Weeks    Status Achieved      PT SHORT TERM GOAL #3   Title Pt will improve gait speed with no AD to at least 3.3 ft/sec in order to demo improved gait efficiency.    Baseline 2.93 ft/sec. 12/25/20 2.45ft/sec    Time 4    Period Weeks    Status Not Met      PT SHORT TERM GOAL #4   Title Pt and pt's family will verbalize understanding of fall prevention in the home in order to demo decr fall risk.    Baseline 12/25/20 PT educated on fall prevention strategies around home and pt and his neice verbalized understanding.    Time 4    Period Weeks    Status Achieved      PT SHORT TERM GOAL #5   Title Pt will improve FGA score to at least a 19/30 in order to demo decr fall risk.    Baseline 16/30. 12/25/20 FGA=18/30    Time 4    Period Weeks    Status Partially Met               PT Long Term Goals - 11/25/20 1757       PT LONG TERM GOAL #1   Title Pt will be independent with final HEP in order to build up functional gains made in therapy. ALL LTGS DUE 01/20/21    Time 8    Period Weeks    Status New    Target Date 01/20/21      PT LONG TERM GOAL #2   Title Pt will decr 5x sit <> stand time to 16 seconds or less in order to demo improved functional BLE strength.    Baseline 20.88 seconds without UE support    Time 8    Period Weeks  Status New      PT LONG TERM GOAL #3   Title Pt will ambulate at least 300' outdoors over unlevel surfaces with supervision in order to demo improved community mobility.    Time 8    Period Weeks    Status New      PT LONG TERM GOAL #4   Title Pt will improve FGA score to at least a 22/30 in order to demo decr fall risk.    Baseline 16/30    Time 8    Period Weeks    Status New      PT LONG TERM GOAL #5   Title Pt will improve condition 4 of mCTSIB to at least 20 seconds in order to demo  improved vestibular input for balance.    Baseline 7 seconds    Time 8    Period Weeks    Status New                   Plan - 12/31/20 1159     Clinical Impression Statement Focused on gait training with [facility] today with pt needing cues for proper cane placement and sequencing. Worked on standing balance strategies with SLS tasks and BLE strengthening. Pt reporting feeling dizzy and orthostatics noted again today after standing activity. PT sent message to pt's PCP regarding BP values during last 2 therapy sessions and pt's niece to also call pt's PCP office for earlier follow up.    Personal Factors and Comorbidities Comorbidity 3+;Past/Current Experience;Time since onset of injury/illness/exacerbation    Comorbidities CVA, alcohol use, diabetes, hyperlipidemia, hypertension, iron deficiency anemia, depression, GERD, prostate cancer    Examination-Activity Limitations Stairs;Transfers;Locomotion Level    Examination-Participation Restrictions Community Activity;Driving    Stability/Clinical Decision Making Stable/Uncomplicated    Rehab Potential Good    PT Frequency 2x / week   12 visits over 8 weeks   PT Duration 8 weeks   12 visits over 8 weeks   PT Treatment/Interventions ADLs/Self Care Home Management;DME Instruction;Gait training;Stair training;Therapeutic activities;Functional mobility training;Therapeutic exercise;Balance training;Patient/family education;Neuromuscular re-education;Vestibular    PT Next Visit Plan Check BP - pt still orthostatic.  Continue with balance training and gait on varied surfaces. BLE strengthening, SLS tasks. try Scifit/Nustep for strength/activity tolerance    PT Home Exercise Plan Access Code [email or web address]    Consulted and Agree with Plan of Care Patient;Family member/caregiver    Family Member Consulted Sister             Patient will benefit from skilled therapeutic intervention in order to improve the following deficits and impairments:   Abnormal gait, Decreased balance, Decreased strength, Impaired sensation, Postural dysfunction, Difficulty walking, Decreased activity tolerance  Visit Diagnosis: Other abnormalities of gait and mobility  Muscle weakness (generalized)  Unsteadiness on feet     Problem List Patient Active Problem List   Diagnosis Date Noted   Hospital discharge follow-up 12/24/2020   Weakness of both legs    Adenomatous duodenal polyp    AVM (arteriovenous malformation) of duodenum, acquired with hemorrhage    Need for hepatitis C screening test 11/10/2020   Pneumococcal vaccination administered at current visit 11/10/2020   Healthcare maintenance 11/06/2020   Hypoglycemia 02/77/4128   Acute metabolic encephalopathy 78/67/6720   Acute on chronic anemia 10/22/2020   Angiodysplasia of intestine    GI bleed 10/05/2020   Melena    Upper GI bleed    Iron deficiency anemia 05/22/2020   Constipation 05/22/2020   GIB (gastrointestinal bleeding) 05/21/2020  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 ([place])    History of CVA (cerebrovascular accident) 04/23/2020   Hyperlipidemia associated with type 2 diabetes mellitus ([place]) 04/23/2020   Alcohol use 04/23/2020   AKI (acute kidney injury) ([place]) 04/23/2020   Tobacco use 04/23/2020   Unresponsive 11/08/2018   Syncopal seizure ([place]) 11/08/2018   Hypertension associated with diabetes ([place]) 11/08/2018   Alcohol withdrawal seizure with complication, with unspecified complication ([place]) 37/05/3015   Type 2 diabetes mellitus without complication ([place]) 20/91/0681   Symptomatic anemia 04/15/2018   Prostate cancer ([place]) 02/27/2014   Malignant neoplasm of prostate ([place]) 01/09/2014   Bunion 01/17/2013   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    Arliss Journey, PT, DPT  12/31/2020, 12:01 PM  [facility] [address] [place], [place], [address] Phone: [phone]   Fax:   [phone]  Name: Gregory Nunez. MRN: [id number] Date of Birth: [birth year]

## 2020-12-31 NOTE — Therapy (Signed)
[facility] [address] [place], [place], [address] Phone: [phone]   Fax:  [phone]  Occupational Therapy Treatment  Patient Details  Name: Gregory Nunez. MRN: [id number] Date of Birth: Oct 18, 1945 Referring Provider (OT): Dr. Farrel Gordon   Encounter Date: 12/31/2020   OT End of Session - 12/31/20 0918     Visit Number 3    Number of Visits 21    Date for OT Re-Evaluation 02/07/21    Authorization Type UHC MCR/MCD    Progress Note Due on Visit 10    OT Start Time 0845    OT Stop Time 0930    OT Time Calculation (min) 45 min    Activity Tolerance Patient tolerated treatment well    Behavior During Therapy WFL for tasks assessed/performed             Past Medical History:  Diagnosis Date   CAO (chronic airflow obstruction) (HCC)    Cataract    OD   CVA (cerebral vascular accident) ([place])    Depression    Diabetes mellitus without complication ([place])    Diabetic retinopathy ([place])    NPDR OU   GERD (gastroesophageal reflux disease)    if drinks alcohol   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   Patient is approximately 5-week status post bunion correction left foot   Hyperlipidemia    Hypertension    Hypertensive retinopathy    OU   Malignant neoplasm of prostate ([place]) 01/09/2014   Neuropathy    Pancreatitis    Prostate cancer ([place]) 12/19/2013   Gleason 4+3=7, volume 31.31 cc   Sciatica    Shortness of breath dyspnea    with exertion     Past Surgical History:  Procedure Laterality Date   BIOPSY  04/16/2018   Procedure: BIOPSY;  Surgeon: Juanita Craver, MD;  [facility];  Service: Endoscopy;;   BIOPSY  05/23/2020   Procedure: BIOPSY;  Surgeon: Jackquline Denmark, MD;  [facility];  Service: Gastroenterology;;  EGD and COLON   BIOPSY  12/09/2020   Procedure: BIOPSY;  Surgeon: Thornton Park, MD;  Location: [facility];  Service: Gastroenterology;;   biopsy on  throat     hx of    CATARACT EXTRACTION Bilateral    Dr. Quentin Ore   COLONOSCOPY N/A 05/23/2020   Procedure: COLONOSCOPY;  Surgeon: Jackquline Denmark, MD;  [facility];  Service: Gastroenterology;  Laterality: N/A;   ENTEROSCOPY N/A 10/07/2020   Procedure: ENTEROSCOPY;  Surgeon: [name], MD;  [facility];  Service: Endoscopy;  Laterality: N/A;   ESOPHAGOGASTRODUODENOSCOPY Left 04/16/2018   Procedure: ESOPHAGOGASTRODUODENOSCOPY (EGD);  Surgeon: Juanita Craver, MD;  Location: [facility];  Service: Endoscopy;  Laterality: Left;   ESOPHAGOGASTRODUODENOSCOPY (EGD) WITH PROPOFOL N/A 05/23/2020   Procedure: ESOPHAGOGASTRODUODENOSCOPY (EGD) WITH PROPOFOL;  Surgeon: Jackquline Denmark, MD;  [facility];  Service: Gastroenterology;  Laterality: N/A;   ESOPHAGOGASTRODUODENOSCOPY (EGD) WITH PROPOFOL N/A 12/09/2020   Procedure: ESOPHAGOGASTRODUODENOSCOPY (EGD) WITH PROPOFOL;  Surgeon: Thornton Park, MD;  Location: [facility];  Service: Gastroenterology;  Laterality: N/A;   EYE SURGERY     FOOT SURGERY     HOT HEMOSTASIS N/A 04/16/2018   Procedure: HOT HEMOSTASIS (ARGON PLASMA COAGULATION/BICAP);  Surgeon: Juanita Craver, MD;  Location: [facility];  Service: Endoscopy;  Laterality: N/A;   HOT HEMOSTASIS N/A 05/23/2020   Procedure: HOT HEMOSTASIS (ARGON PLASMA COAGULATION/BICAP);  Surgeon: Jackquline Denmark, MD;  Location: [facility];  Service: Gastroenterology;  Laterality: N/A;  HOT HEMOSTASIS N/A 12/09/2020   Procedure: HOT HEMOSTASIS (ARGON PLASMA COAGULATION/BICAP);  Surgeon: Thornton Park, MD;  Location: [facility];  Service: Gastroenterology;  Laterality: N/A;   LYMPHADENECTOMY Bilateral 02/27/2014   Procedure: BILATERAL LYMPHADENECTOMY;  Surgeon: Alexis Frock, MD;  [facility];  Service: Urology;  Laterality: Bilateral;   POLYPECTOMY  05/23/2020   Procedure: POLYPECTOMY;  Surgeon: Jackquline Denmark, MD;  [facility];  Service: Gastroenterology;;    PROSTATE BIOPSY  12/2013   Gleason 4+3=7, volume 31.31 cc   ROBOT ASSISTED LAPAROSCOPIC RADICAL PROSTATECTOMY N/A 02/27/2014   Procedure: ROBOTIC ASSISTED LAPAROSCOPIC RADICAL PROSTATECTOMY WITH INDOCYANINE GREEN DYE;  Surgeon: Alexis Frock, MD;  [facility];  Service: Urology;  Laterality: N/A;    There were no vitals filed for this visit.   Subjective Assessment - 12/31/20 0844     Patient is accompanied by: Family member   niece Jeannene Patella)   Pertinent History CVA x 2 (Jan and Feb 2022), recent syncopal seizure 10/05/20, DM w/ diabetic retinopathy, HTN, HLD, GERD    Limitations NO Driving, **recent seizure    Currently in Pain? No/denies             Reviewed coordination HEP w/ pt and family. Pt required mod cueing to perform correctly, however family present to ensure carryover at home.  Practiced writing - pt w/ micrographia. Pt did best w/ built up pen and cues to write big.  Placing medium sized pegs in pegboard (3 rows) and removing Rt hand with min to no difficulty                        OT Short Term Goals - 12/03/20 0935       OT SHORT TERM GOAL #1   Title Independent with initial HEP for bilateral coordination and Rt grip strength    Time 5    Period Weeks    Status On-going      OT SHORT TERM GOAL #2   Title Pt/family to verbalize acquisition of tub bench to prevent falls    Time 5    Period Weeks    Status Achieved      OT SHORT TERM GOAL #3   Title Pt to consistently perform snack prep and heat up microwaveable items    Time 5    Period Weeks    Status New      OT SHORT TERM GOAL #4   Title Pt to wash dishes and fold towels consistently    Time 5    Period Weeks    Status New      OT SHORT TERM GOAL #5   Title Pt to improve coordination bilaterally by 10 sec    Baseline Rt = 1 min, Lt = 47 sec    Time 5    Period Weeks    Status New               OT Long Term Goals - 11/25/20 0956       OT LONG TERM GOAL #1    Title Independent with UE HEP for strengthening    Time 10    Period Weeks    Status New      OT LONG TERM GOAL #2   Title Pt to improve Rt grip strength by 5 lbs or greater    Baseline 51 lbs (Lt = 60 lbs)    Time 10    Period Weeks    Status New  OT LONG TERM GOAL #3   Title Pt to perform simple cooking task (egg) with distant supervision    Time 10    Period Weeks    Status New      OT LONG TERM GOAL #4   Title Pt to consistently perform light IADLS (all cleaning) at mod I level    Time 10    Period Weeks    Status New      OT LONG TERM GOAL #5   Title Pt to perform simple financial management with direct supervision    Time 10    Period Weeks    Status New                   Plan - 12/31/20 0918     Clinical Impression Statement Pt/family with greater understanding of coordination HEP and how to perform for greatest benefit    OT Occupational Profile and History Detailed Assessment- Review of Records and additional review of physical, cognitive, psychosocial history related to current functional performance    Occupational performance deficits (Please refer to evaluation for details): IADL's;ADL's;Social Participation    Body Structure / Function / Physical Skills Strength;Dexterity;UE functional use;IADL;Endurance;Mobility;Coordination;FMC;Vision    Cognitive Skills Problem Solve;Sequencing;Perception;Learn    Rehab Potential Good    Clinical Decision Making Several treatment options, min-mod task modification necessary    Comorbidities Affecting Occupational Performance: Presence of comorbidities impacting occupational performance    Comorbidities impacting occupational performance description: seizure, diabetic retinopathy    Modification or Assistance to Complete Evaluation  No modification of tasks or assist necessary to complete eval    OT Frequency 2x / week    OT Duration --   10 weeks (anticipate only 6-8 weeks)   OT Treatment/Interventions  Self-care/ADL training;DME and/or AE instruction;Therapeutic activities;Therapeutic exercise;Cognitive remediation/compensation;Coping strategies training;Visual/perceptual remediation/compensation;Functional Mobility Training;Neuromuscular education;Manual Therapy;Patient/family education    Plan continue coordination Rt hand, light IADLS (monitor BP)    Consulted and Agree with Plan of Care Patient;Family member/caregiver    Family Member Consulted niece [name])             Patient will benefit from skilled therapeutic intervention in order to improve the following deficits and impairments:   Body Structure / Function / Physical Skills: Strength, Dexterity, UE functional use, IADL, Endurance, Mobility, Coordination, FMC, Vision Cognitive Skills: Problem Solve, Sequencing, Perception, Learn     Visit Diagnosis: Other lack of coordination  Muscle weakness (generalized)    Problem List Patient Active Problem List   Diagnosis Date Noted   Hospital discharge follow-up 12/24/2020   Weakness of both legs    Adenomatous duodenal polyp    AVM (arteriovenous malformation) of duodenum, acquired with hemorrhage    Need for hepatitis C screening test 11/10/2020   Pneumococcal vaccination administered at current visit 11/10/2020   Healthcare maintenance 11/06/2020   Hypoglycemia [id number]   Acute metabolic encephalopathy [id number]   Acute on chronic anemia 10/22/2020   Angiodysplasia of intestine    GI bleed 10/05/2020   Melena    Upper GI bleed    Iron deficiency anemia 05/22/2020   Constipation 05/22/2020   GIB (gastrointestinal bleeding) 05/21/2020   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 ([place])    History of CVA (cerebrovascular accident) 04/23/2020   Hyperlipidemia associated with type 2 diabetes mellitus ([place]) 04/23/2020   Alcohol use 04/23/2020   AKI (acute kidney injury) ([place]) 04/23/2020   Tobacco use 04/23/2020   Unresponsive 11/08/2018    Syncopal seizure ([place])  11/08/2018   Hypertension associated with diabetes ([place]) 11/08/2018   Alcohol withdrawal seizure with complication, with unspecified complication ([place]) [id number]   Type 2 diabetes mellitus without complication ([place]) [id number]   Symptomatic anemia 04/15/2018   Prostate cancer ([place]) 02/27/2014   Malignant neoplasm of prostate ([place]) 01/09/2014   Bunion 01/17/2013   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    Carey Bullocks, OTR/L 12/31/2020, 9:20 AM  [facility] [address] [place], [place], [address] Phone: [phone]   Fax:  [phone]  Name: Gregory Nunez. MRN: [id number] Date of Birth: [birth year]

## 2020-12-31 NOTE — Therapy (Signed)
[facility] [address] [place], [place], [address] Phone: [phone]   Fax:  [phone]  Speech Language Pathology Treatment  Patient Details  Name: Gregory Nunez. MRN: [id number] Date of Birth: March 25, 1946 Referring Provider (SLP): Dr. Velna Ochs   Encounter Date: 12/31/2020   End of Session - 12/31/20 1121     Visit Number 7    Number of Visits 25    Date for SLP Re-Evaluation 02/09/21    SLP Start Time 0930    SLP Stop Time  8250    SLP Time Calculation (min) 45 min    Activity Tolerance Patient tolerated treatment well             Past Medical History:  Diagnosis Date   CAO (chronic airflow obstruction) ([place])    Cataract    OD   CVA (cerebral vascular accident) ([place])    Depression    Diabetes mellitus without complication ([employer])    Diabetic retinopathy ([place])    NPDR OU   GERD (gastroesophageal reflux disease)    if drinks alcohol   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   Patient is approximately 5-week status post bunion correction left foot   Hyperlipidemia    Hypertension    Hypertensive retinopathy    OU   Malignant neoplasm of prostate ([place]) 01/09/2014   Neuropathy    Pancreatitis    Prostate cancer ([place]) 12/19/2013   Gleason 4+3=7, volume 31.31 cc   Sciatica    Shortness of breath dyspnea    with exertion     Past Surgical History:  Procedure Laterality Date   BIOPSY  04/16/2018   Procedure: BIOPSY;  Surgeon: Juanita Craver, MD;  Location: [facility];  Service: Endoscopy;;   BIOPSY  05/23/2020   Procedure: BIOPSY;  Surgeon: Jackquline Denmark, MD;  Location: [facility];  Service: Gastroenterology;;  EGD and COLON   BIOPSY  12/09/2020   Procedure: BIOPSY;  Surgeon: Thornton Park, MD;  Location: [facility] END[facility]COPY;  Service: Gastroenterology;;   biopsy on throat     hx of    CATARACT EXTRACTION Bilateral    Dr. Quentin Ore   COLONOSCOPY N/A 05/23/2020   Procedure:  COLONOSCOPY;  Surgeon: Jackquline Denmark, MD;  Location: [facility];  Service: Gastroenterology;  Laterality: N/A;   ENTEROSCOPY N/A 10/07/2020   Procedure: ENTEROSCOPY;  Surgeon: [name], MD;  Location: [facility] ENDOSCOPY;  Service: Endoscopy;  Laterality: N/A;   ESOPHAGOGASTRODUODENOSCOPY Left 04/16/2018   Procedure: ESOPHAGOGASTRODUODENOSCOPY (EGD);  Surgeon: Juanita Craver, MD;  Location: [facility];  Service: Endoscopy;  Laterality: Left;   ESOPHAGOGASTRODUODENOSCOPY (EGD) WITH PROPOFOL N/A 05/23/2020   Procedure: ESOPHAGOGASTRODUODENOSCOPY (EGD) WITH PROPOFOL;  Surgeon: Jackquline Denmark, MD;  Location: [facility];  Service: Gastroenterology;  Laterality: N/A;   ESOPHAGOGASTRODUODENOSCOPY (EGD) WITH PROPOFOL N/A 12/09/2020   Procedure: ESOPHAGOGASTRODUODENOSCOPY (EGD) WITH PROPOFOL;  Surgeon: Thornton Park, MD;  Location: [facility];  Service: Gastroenterology;  Laterality: N/A;   EYE SURGERY     FOOT SURGERY     HOT HEMOSTASIS N/A 04/16/2018   Procedure: HOT HEMOSTASIS (ARGON PLASMA COAGULATION/BICAP);  Surgeon: Juanita Craver, MD;  Location: [facility];  Service: Endoscopy;  Laterality: N/A;   HOT HEMOSTASIS N/A 05/23/2020   Procedure: HOT HEMOSTASIS (ARGON PLASMA COAGULATION/BICAP);  Surgeon: Jackquline Denmark, MD;  Location: [facility];  Service: Gastroenterology;  Laterality: N/A;   HOT HEMOSTASIS N/A 12/09/2020   Procedure: HOT HEMOSTASIS (ARGON PLASMA COAGULATION/BICAP);  Surgeon: Thornton Park, MD;  Location: [facility];  Service:  Gastroenterology;  Laterality: N/A;   LYMPHADENECTOMY Bilateral 02/27/2014   Procedure: BILATERAL LYMPHADENECTOMY;  Surgeon: Alexis Frock, MD;  Location: [facility];  Service: Urology;  Laterality: Bilateral;   POLYPECTOMY  05/23/2020   Procedure: POLYPECTOMY;  Surgeon: Jackquline Denmark, MD;  Location: [facility];  Service: Gastroenterology;;   PROSTATE BIOPSY  12/2013   Gleason 4+3=7, volume 31.31 cc   ROBOT ASSISTED LAPAROSCOPIC RADICAL PROSTATECTOMY N/A  02/27/2014   Procedure: ROBOTIC ASSISTED LAPAROSCOPIC RADICAL PROSTATECTOMY WITH INDOCYANINE GREEN DYE;  Surgeon: Alexis Frock, MD;  Location: [facility];  Service: Urology;  Laterality: N/A;    There were no vitals filed for this visit.   Subjective Assessment - 12/31/20 0936     Subjective "It's getting better, I get up early and walk in the driveway before she comes"    Currently in Pain? No/denies                   ADULT SLP TREATMENT - 12/31/20 0937       General Information   Behavior/Cognition Alert;Cooperative;Pleasant mood      Treatment Provided   Treatment provided Cognitive-Linquistic      Cognitive-Linquistic Treatment   Treatment focused on Aphasia;Cognition;Patient/family/caregiver education    Skilled Treatment Verbal cues to use calendar to ID next appointment and day of the week. Visual impairments affect ability to read calendar. He verbalized awareness of speech/language difficulties and that it takes time for him talk. Word finding and sentence generation targeted to day with Pharmacist, hospital ([name]) with 4 verbs (Measure, throw, read, deliver) [name] generated 3 subject and objects for each word with frequent questioning and semantic cues to generate 3 semantically different subjects and objects. He generated 4 complex sentences (1 for each verb) answering "wh" questions with occasional min A . Consistent extended time required due to slow processing and word finding difficulties. [name] named and wrote 8 items in mildly complex category, 3 independently and 5 with mod semantic and fill in blank letter cues.      Assessment / Recommendations / Plan   Plan Continue with current plan of care      Progression Toward Goals   Progression toward goals Progressing toward goals                SLP Short Term Goals - 12/31/20 1120       SLP SHORT TERM GOAL #1   Title Pt will use external aids to recall am meds with 3 or less reminders  from family over 1 week    Baseline family remidning his consistently every day    Time 1    Period Weeks    Status Not Met      SLP SHORT TERM GOAL #2   Title [name] will carryover 2 compensations for memory and attention to complete 3 house hold chores a week with occasiona min A from family    Baseline not completing house hold chores    Time 1    Period Weeks    Status Not Met      SLP SHORT TERM GOAL #3   Title 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 18/20 sentences with occasional min A    Time 1    Period Weeks    Status Not Met      SLP SHORT TERM GOAL #4   Title Pt will name 10 items for personally relevant category using verbal compensations as needed with occasional min A over 2  sessions    Time 1    Period Weeks    Status Not Met      SLP SHORT TERM GOAL #5   Title Pt will fill out 2 money orders to pay bills with occasional min A from family    Baseline Nephew is paying his bills for him    Time 1    Period Weeks    Status Not Met              SLP Long Term Goals - 12/31/20 1121       SLP LONG TERM GOAL #1   Title Pt will use external aids and compensations to manage his medications, appointments and daily to do list with occasional min A from family    Baseline Family managing meds and appointments    Time 7    Period Weeks    Status On-going      SLP LONG TERM GOAL #2   Title 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 over 15 minute conversation to be 95% intelligible with occasional min A    Time 7    Period Weeks    Status On-going      SLP LONG TERM GOAL #3   Title Pt will ID aphasic errors in simple conversation with 3 or less verbal cues and attempt to self correct errors    Baseline Pt unaware of errors    Time 7    Period Weeks    Status On-going      SLP LONG TERM GOAL #4   Title Pt will use organization system, calendar to pay 3 bills on time with occasional min A from family.    Baseline  family managing his finances    Time 7    Period Weeks    Status On-going      SLP LONG TERM GOAL #5   Title Family will rate Fermon' communciation on The Communicative Participation Item [employer] General Short Form a 12 or higher    Baseline rated a score of 9    Time 7    Period Weeks    Status On-going      Additional Long Term Goals   Additional Long Term Goals Yes      SLP LONG TERM GOAL #6   Title Pt will carryover 3 strategies for attention and memory to cook3  simple meals with supervision from family    Baseline not cooking    Time 7    Period Weeks    Status On-going              Plan - 12/31/20 1120     Clinical Impression Statement Moderate aphasia and cognitive impairments including memory, awareness, executive function, and attention. Word finding in structured tasks required mod A. Initiated training in compensatory strategies for cognition and aphasia, as well as cognitive and language activities to do at home. Pt continues to required supervision/family A for IADL's. Continue skilled ST to maximize cognition and language for improved independence, safety and pt's goal to return to his home independently.    Speech Therapy Frequency 2x / week    Duration 12 weeks    Treatment/Interventions Cueing hierarchy;Environmental controls;Language facilitation;SLP instruction and feedback;Compensatory strategies;Functional tasks;Cognitive reorganization;Compensatory techniques;Internal/external aids;Multimodal communcation approach;Patient/family education    Potential to Achieve Goals Fair    Potential Considerations Co-morbidities;Previous level of function;Cooperation/participation level             Patient will benefit from skilled therapeutic intervention in  order to improve the following deficits and impairments:   Aphasia  Cognitive communication deficit    Problem List Patient Active Problem List   Diagnosis Date Noted   Hospital discharge follow-up  12/24/2020   Weakness of both legs    Adenomatous duodenal polyp    AVM (arteriovenous malformation) of duodenum, acquired with hemorrhage    Need for hepatitis C screening test 11/10/2020   Pneumococcal vaccination administered at current visit 11/10/2020   Healthcare maintenance 11/06/2020   Hypoglycemia 98/28/6751   Acute metabolic encephalopathy 98/24/2998   Acute on chronic anemia 10/22/2020   Angiodysplasia of intestine    GI bleed 10/05/2020   Melena    Upper GI bleed    Iron deficiency anemia 05/22/2020   Constipation 05/22/2020   GIB (gastrointestinal bleeding) 05/21/2020   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 ([place])    History of CVA (cerebrovascular accident) 04/23/2020   Hyperlipidemia associated with type 2 diabetes mellitus ([place]) 04/23/2020   Alcohol use 04/23/2020   AKI (acute kidney injury) ([place]) 04/23/2020   Tobacco use 04/23/2020   Unresponsive 11/08/2018   Syncopal seizure ([place]) 11/08/2018   Hypertension associated with diabetes ([place]) 11/08/2018   Alcohol withdrawal seizure with complication, with unspecified complication ([place]) 06/99/9672   Type 2 diabetes mellitus without complication ([place]) 27/73/7505   Symptomatic anemia 04/15/2018   Prostate cancer ([place]) 02/27/2014   Malignant neoplasm of prostate ([place]) 01/09/2014   Bunion 01/17/2013   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    Zion Ta, Annye Rusk MS, CCC-SLP 12/31/2020, 11:23 AM  [facility] [address] [place], [place], [address] Phone: [phone]   Fax:  [phone]   Name: Taevyn Hausen. MRN: [id number] Date of Birth: [birth year]

## 2020-12-31 NOTE — Telephone Encounter (Signed)
Dr. Marlou Sa,  I have been working with your patient Gregory Nunez at [facility] after his recent hospitalization.  His BP has been dropping after standing activity.   On 12/29/20 his BP was 129/61 and it dropped to 104/47.  Today his BP at start of session was 128/65, and after brief standing activity it dropped to 107/34, with pt feeling slightly dizzy.   I wanted to make you aware.  Thank you, Janann August, PT, DPT 12/31/20 10:50 AM

## 2021-01-05 ENCOUNTER — Encounter: Payer: Medicare Other | Admitting: Occupational Therapy

## 2021-01-05 ENCOUNTER — Encounter: Payer: Medicare Other | Admitting: Speech Pathology

## 2021-01-05 ENCOUNTER — Encounter: Payer: Self-pay | Admitting: Diagnostic Neuroimaging

## 2021-01-05 ENCOUNTER — Other Ambulatory Visit: Payer: Self-pay

## 2021-01-05 ENCOUNTER — Ambulatory Visit (INDEPENDENT_AMBULATORY_CARE_PROVIDER_SITE_OTHER): Payer: Medicare Other | Admitting: Diagnostic Neuroimaging

## 2021-01-05 VITALS — BP 144/67 | HR 94 | Ht 71.0 in | Wt 165.0 lb

## 2021-01-05 DIAGNOSIS — F10239 Alcohol dependence with withdrawal, unspecified: Secondary | ICD-10-CM

## 2021-01-05 DIAGNOSIS — I639 Cerebral infarction, unspecified: Secondary | ICD-10-CM

## 2021-01-05 DIAGNOSIS — G40909 Epilepsy, unspecified, not intractable, without status epilepticus: Secondary | ICD-10-CM | POA: Diagnosis not present

## 2021-01-05 DIAGNOSIS — R569 Unspecified convulsions: Secondary | ICD-10-CM

## 2021-01-05 MED ORDER — LEVETIRACETAM 500 MG PO TABS: 500.0000 mg | ORAL_TABLET | Freq: Two times a day (BID) | ORAL | 4 refills | Status: DC

## 2021-01-05 NOTE — Progress Notes (Signed)
Internal Medicine Clinic Attending  I saw and evaluated the patient.  I personally confirmed the key portions of the history and exam documented by Dr. Gawaluck and I reviewed pertinent patient test results.  The assessment, diagnosis, and plan were formulated together and I agree with the documentation in the resident's note.  

## 2021-01-05 NOTE — Progress Notes (Signed)
[facility]  PATIENT: Gregory Nunez. DOB: 1946/02/09  REFERRING CLINICIAN: Farrel Gordon, DO HISTORY FROM: patient  REASON FOR VISIT: new consult    HISTORICAL  CHIEF COMPLAINT:  Chief Complaint  Patient presents with   New Patient (Initial Visit)    Room 7. Referred by Gregory Ebbs, MD for post CVA. Pt here with niece, Gregory Nunez.     HISTORY OF PRESENT ILLNESS:   75 year old male here for evaluation of stroke and seizure.  July 2020 patient presented to hospital for witnessed seizure, possibly related to alcohol withdrawal.  January 2022 patient presented to hospital for right-sided weakness and shakiness.  Patient was diagnosed with stroke and completed work-up.  June 2022 patient presented to hospital for syncope and visual scintillations.  Abnormal EEG slowing was noted.  Patient was discharged on antiseizure medication.  Since that time patient is doing well.  No further seizures or syncope.  No more strokelike symptoms.  He stayed away from alcohol.   REVIEW OF SYSTEMS: Full 14 system review of systems performed and negative with exception of: as per HPI.   ALLERGIES: Allergies  Allergen Reactions   Penicillins Anxiety and Other (See Comments)    DID THE REACTION INVOLVE: Swelling of the face/tongue/throat, SOB, or low BP? No Sudden or severe rash/hives, skin peeling, or the inside of the mouth or nose? No Did it require medical treatment? No When did it last happen?      many years ago If all above answers are "NO", may proceed with cephalosporin use.       HOME MEDICATIONS: Outpatient Medications Prior to Visit  Medication Sig Dispense Refill   amLODipine (NORVASC) 10 MG tablet Take 1 tablet (10 mg total) by mouth daily. 30 tablet 3   aspirin EC 81 MG EC tablet Take 1 tablet (81 mg total) by mouth daily. Swallow whole.     atorvastatin (LIPITOR) 40 MG tablet Take 1 tablet (40 mg total) by mouth daily. 90 tablet 0   losartan  (COZAAR) 100 MG tablet Take 100 mg by mouth daily.     losartan-hydrochlorothiazide (HYZAAR) 100-12.5 MG tablet Take 1 tablet by mouth daily.     metFORMIN (GLUCOPHAGE) 500 MG tablet Take 500 mg by mouth 2 (two) times daily.     pantoprazole (PROTONIX) 40 MG tablet Take 1 tablet (40 mg total) by mouth daily. 30 tablet 3   levETIRAcetam (KEPPRA) 500 MG tablet Take 1 tablet (500 mg total) by mouth 2 (two) times daily. 60 tablet 3   docusate sodium (COLACE) 250 MG capsule Take 1 capsule (250 mg total) by mouth daily. (Patient not taking: Reported on 01/05/2021) 10 capsule 0   ferrous sulfate 325 (65 FE) MG tablet Take 1 tablet (325 mg total) by mouth daily. (Patient not taking: Reported on 01/05/2021) 30 tablet 3   iron polysaccharides (NU-IRON) 150 MG capsule Take 1 capsule (150 mg total) by mouth daily. (Patient not taking: No sig reported) 30 capsule 0   No facility-administered medications prior to visit.    PAST MEDICAL HISTORY: Past Medical History:  Diagnosis Date   CAO (chronic airflow obstruction) (HCC)    Cataract    OD   CVA (cerebral vascular accident) ([place])    Depression    Diabetes mellitus without complication ([place])    Diabetic retinopathy ([place])    NPDR OU   GERD (gastroesophageal reflux disease)    if drinks alcohol   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   Patient is  approximately 5-week status post bunion correction left foot   Hyperlipidemia    Hypertension    Hypertensive retinopathy    OU   Malignant neoplasm of prostate ([place]) 01/09/2014   Neuropathy    Pancreatitis    Prostate cancer ([place]) 12/19/2013   Gleason 4+3=7, volume 31.31 cc   Sciatica    Shortness of breath dyspnea    with exertion     PAST SURGICAL HISTORY: Past Surgical History:  Procedure Laterality Date   BIOPSY  04/16/2018   Procedure: BIOPSY;  Surgeon: Gregory Craver, MD;  Location: [facility];  Service: Endoscopy;;   BIOPSY  05/23/2020   Procedure: BIOPSY;  Surgeon: Gregory Denmark, MD;   Location: [facility];  Service: Gastroenterology;;  EGD and COLON   BIOPSY  12/09/2020   Procedure: BIOPSY;  Surgeon: Gregory Park, MD;  Location: [facility] END[facility]COPY;  Service: Gastroenterology;;   biopsy on throat     hx of    CATARACT EXTRACTION Bilateral    Dr. Quentin Ore   COLONOSCOPY N/A 05/23/2020   Procedure: COLONOSCOPY;  Surgeon: Gregory Denmark, MD;  Location: [facility];  Service: Gastroenterology;  Laterality: N/A;   ENTEROSCOPY N/A 10/07/2020   Procedure: ENTEROSCOPY;  Surgeon: Gregory Mayer, MD;  Location: [facility] END[facility]COPY;  Service: Endoscopy;  Laterality: N/A;   ESOPHAGOGASTRODUODENOSCOPY Left 04/16/2018   Procedure: ESOPHAGOGASTRODUODENOSCOPY (EGD);  Surgeon: Gregory Craver, MD;  Location: [facility];  Service: Endoscopy;  Laterality: Left;   ESOPHAGOGASTRODUODENOSCOPY (EGD) WITH PROPOFOL N/A 05/23/2020   Procedure: ESOPHAGOGASTRODUODENOSCOPY (EGD) WITH PROPOFOL;  Surgeon: Gregory Denmark, MD;  Location: [facility];  Service: Gastroenterology;  Laterality: N/A;   ESOPHAGOGASTRODUODENOSCOPY (EGD) WITH PROPOFOL N/A 12/09/2020   Procedure: ESOPHAGOGASTRODUODENOSCOPY (EGD) WITH PROPOFOL;  Surgeon: Gregory Park, MD;  Location: [place];  Service: Gastroenterology;  Laterality: N/A;   EYE SURGERY     FOOT SURGERY     HOT HEMOSTASIS N/A 04/16/2018   Procedure: HOT HEMOSTASIS (ARGON PLASMA COAGULATION/BICAP);  Surgeon: Gregory Craver, MD;  Location: [facility];  Service: Endoscopy;  Laterality: N/A;   HOT HEMOSTASIS N/A 05/23/2020   Procedure: HOT HEMOSTASIS (ARGON PLASMA COAGULATION/BICAP);  Surgeon: Gregory Denmark, MD;  Location: [facility];  Service: Gastroenterology;  Laterality: N/A;   HOT HEMOSTASIS N/A 12/09/2020   Procedure: HOT HEMOSTASIS (ARGON PLASMA COAGULATION/BICAP);  Surgeon: Gregory Park, MD;  Location: [facility];  Service: Gastroenterology;  Laterality: N/A;   LYMPHADENECTOMY Bilateral 02/27/2014   Procedure: BILATERAL LYMPHADENECTOMY;  Surgeon: Gregory Frock, MD;  Location: [facility];  Service: Urology;  Laterality: Bilateral;   POLYPECTOMY  05/23/2020   Procedure: POLYPECTOMY;  Surgeon: Gregory Denmark, MD;  Location: [facility];  Service: Gastroenterology;;   PROSTATE BIOPSY  12/2013   Gleason 4+3=7, volume 31.31 cc   ROBOT ASSISTED LAPAROSCOPIC RADICAL PROSTATECTOMY N/A 02/27/2014   Procedure: ROBOTIC ASSISTED LAPAROSCOPIC RADICAL PROSTATECTOMY WITH INDOCYANINE GREEN DYE;  Surgeon: Gregory Frock, MD;  Location: [facility];  Service: Urology;  Laterality: N/A;    FAMILY HISTORY: Family History  Problem Relation Age of Onset   Heart disease Mother    Heart attack Father 67   Cancer Sister        breast   Colon cancer Neg Hx    Esophageal cancer Neg Hx    Rectal cancer Neg Hx    Stomach cancer Neg Hx     SOCIAL HISTORY: Social History   Socioeconomic History   Marital status: Married    Spouse name: Not on file   Number of children: Not on file  Years of education: Not on file   Highest education level: Not on file  Occupational History   Not on file  Tobacco Use   Smoking status: Every Day    Packs/day: 0.50    Types: Cigarettes   Smokeless tobacco: Never   Tobacco comments:    1 ppd Wants Patches   Vaping Use   Vaping Use: Never used  Substance and Sexual Activity   Alcohol use: Not Currently    Alcohol/week: 1.0 standard drink    Types: 1 Cans of beer per week   Drug use: Not Currently    Types: Marijuana, Cocaine    Comment: past hx approx 30 years ago    Sexual activity: Not on file  Other Topics Concern   Not on file  Social History Narrative   Lives with sister   Occas tea   Social Determinants of Health   Financial Resource Strain: Not on file  Food Insecurity: Not on file  Transportation Needs: Not on file  Physical Activity: Not on file  Stress: Not on file  Social Connections: Not on file  Intimate Partner Violence: Not on file     PHYSICAL EXAM  GENERAL EXAM/CONSTITUTIONAL: Vitals:   Vitals:   01/05/21 0835  BP: (!) 144/67  Pulse: 94  Weight: 165 lb (74.8 kg)  Height: '5\' 11"'$  (1.803 m)   Body mass index is 23.01 kg/m. Wt Readings from Last 3 Encounters:  01/05/21 165 lb (74.8 kg)  12/26/20 169 lb 11.2 oz (77 kg)  12/09/20 191 lb (86.6 kg)   Patient is in no distress; well developed, nourished and groomed; neck is supple  CARDIOVASCULAR: Examination of carotid arteries is normal; no carotid bruits Regular rate and rhythm, no murmurs Examination of peripheral vascular system by observation and palpation is normal  EYES: Ophthalmoscopic exam of optic discs and posterior segments is normal; no papilledema or hemorrhages No results found.  MUSCULOSKELETAL: Gait, strength, tone, movements noted in Neurologic exam below  NEUROLOGIC: MENTAL STATUS:  No flowsheet data found. awake, alert, oriented to person, place and time recent and remote memory intact normal attention and concentration language fluent, comprehension intact, naming intact fund of knowledge appropriate  CRANIAL NERVE:  2nd - no papilledema on fundoscopic exam 2nd, 3rd, 4th, 6th - pupils equal and reactive to light, visual fields full to confrontation, extraocular muscles intact, no nystagmus 5th - facial sensation symmetric 7th - facial strength symmetric 8th - hearing intact 9th - palate elevates symmetrically, uvula midline 11th - shoulder shrug symmetric 12th - tongue protrusion midline MILD DYSARTHRIA  MOTOR:  normal bulk and tone, full strength in the BUE, BLE RIGHT ARM SLOWER THAN LEFT (LEFT ARM ORBITS AROUND RIGHT)  SENSORY:  normal and symmetric to light touch, temperature, vibration  COORDINATION:  finger-nose-finger, fine finger movements normal  REFLEXES:  deep tendon reflexes TRACE and symmetric  GAIT/STATION:  narrow based gait; CAUTIOUS     DIAGNOSTIC DATA (LABS, IMAGING, TESTING) - I reviewed patient records, labs, notes, testing and imaging myself where  available.  Lab Results  Component Value Date   WBC 3.7 (L) 12/26/2020   HGB 7.3 (L) 12/26/2020   HCT 22.9 (L) 12/26/2020   MCV 97.0 12/26/2020   PLT 198 12/26/2020      Component Value Date/Time   NA 138 12/26/2020 1217   K 4.3 12/26/2020 1217   CL 109 12/26/2020 1217   CO2 24 12/26/2020 1217   GLUCOSE 172 (H) 12/26/2020 1217   BUN 34 (  H) 12/26/2020 1217   CREATININE 1.52 (H) 12/26/2020 1217   CREATININE 1.52 (H) 08/28/2020 1427   CALCIUM 8.7 (L) 12/26/2020 1217   PROT 7.8 10/22/2020 2015   ALBUMIN 3.1 (L) 10/22/2020 2015   AST 38 10/22/2020 2015   ALT 28 10/22/2020 2015   ALKPHOS 57 10/22/2020 2015   BILITOT 0.6 10/22/2020 2015   GFRNONAA 48 (L) 12/26/2020 1217   GFRNONAA 45 (L) 08/28/2020 1427   GFRAA 52 (L) 08/28/2020 1427   Lab Results  Component Value Date   CHOL 124 11/06/2020   HDL 42 11/06/2020   LDLCALC 57 11/06/2020   TRIG 142 11/06/2020   CHOLHDL 3.0 11/06/2020   Lab Results  Component Value Date   HGBA1C 6.2 (H) 10/22/2020   Lab Results  Component Value Date   [id number] 12/09/2020   Lab Results  Component Value Date   TSH 1.839 10/06/2020    10/07/20 EEG - This study is suggestive of nonspecific cortical dysfunction in left temporal region.  No seizures or epileptiform discharges were seen throughout the recording.  04/23/20 MRI HEAD IMPRESSION: 1. 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 2. Additional 5 mm subacute ischemic nonhemorrhagic right periatrial white matter infarct. 3. 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   04/23/20 MRA HEAD IMPRESSION: 1. Motion degraded exam. 2. Negative intracranial MRA for large vessel occlusion. 3. 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 No other hemodynamically significant or correctable stenosis identified.   04/23/20 MRA NECK IMPRESSION: 1. Wide patency of both carotid artery systems within the neck. 2. Single short-segment moderate stenosis involving the pre foraminal left V1 segment. Vertebral arteries otherwise widely patent within the neck. Left vertebral artery dominant.   04/24/20 TTE - Conclusion(s)/Recommendation(s): No intracardiac source of embolism  detected on this transthoracic study. A transesophageal echocardiogram is  recommended to exclude cardiac source of embolism if clinically indicated.     ASSESSMENT AND PLAN  75 y.o. year old male here with stroke and seizures, history of alcohol abuse.   Dx:  1. Left basal ganglia embolic stroke (HCC)   2. Seizure disorder ([place])   3. Alcohol withdrawal seizure with complication, with unspecified complication ([place])       PLAN:  STROKE PREVENTION - aspirin '81mg'$  daily, statin, BP control  - continue PT, OT, ST  SEIZURE PREVENTION (last in June 2022) - levetiracetam '500mg'$  twice a day   Meds ordered this encounter  Medications   levETIRAcetam (KEPPRA) 500 MG tablet    Sig: Take 1 tablet (500 mg total) by mouth 2 (two) times daily.    Dispense:  180 tablet    Refill:  4   Return in about 1 year (around 01/05/2022) for with NP Frann Rider).    Penni Bombard, MD [id number], 0000000 AM Certified in Neurology, Neurophysiology and Neuroimaging  [facility] [address] [place], [place] [address] [phone]

## 2021-01-07 ENCOUNTER — Ambulatory Visit: Payer: Medicare Other | Admitting: Occupational Therapy

## 2021-01-07 ENCOUNTER — Other Ambulatory Visit: Payer: Self-pay

## 2021-01-07 ENCOUNTER — Ambulatory Visit: Payer: Medicare Other

## 2021-01-07 DIAGNOSIS — M6281 Muscle weakness (generalized): Secondary | ICD-10-CM

## 2021-01-07 DIAGNOSIS — R278 Other lack of coordination: Secondary | ICD-10-CM

## 2021-01-07 DIAGNOSIS — R2689 Other abnormalities of gait and mobility: Secondary | ICD-10-CM | POA: Diagnosis not present

## 2021-01-07 DIAGNOSIS — R2681 Unsteadiness on feet: Secondary | ICD-10-CM

## 2021-01-07 DIAGNOSIS — R4701 Aphasia: Secondary | ICD-10-CM

## 2021-01-07 DIAGNOSIS — R41841 Cognitive communication deficit: Secondary | ICD-10-CM

## 2021-01-07 NOTE — Therapy (Signed)
[facility] [address] [place], [place], [address] Phone: [phone]   Fax:  [phone]  Physical Therapy Treatment  Patient Details  Name: Gregory Nunez. MRN: [id number] Date of Birth: [birth year] Referring Provider (PT): Velna Ochs, MD   Encounter Date: 01/07/2021   PT End of Session - 01/07/21 1021     Visit Number 6    Number of Visits 13    Date for PT Re-Evaluation 01/24/21    Authorization Type [employer] Medicare    PT Start Time 1018    PT Stop Time 1057    PT Time Calculation (min) 39 min    Equipment Utilized During Treatment Gait belt    Activity Tolerance Treatment limited secondary to medical complications (Comment)   limited by low BP   Behavior During Therapy WFL for tasks assessed/performed             Past Medical History:  Diagnosis Date   CAO (chronic airflow obstruction) (HCC)    Cataract    OD   CVA (cerebral vascular accident) ([place])    Depression    Diabetes mellitus without complication ([place])    Diabetic retinopathy ([place])    NPDR OU   GERD (gastroesophageal reflux disease)    if drinks alcohol   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   Patient is approximately 5-week status post bunion correction left foot   Hyperlipidemia    Hypertension    Hypertensive retinopathy    OU   Malignant neoplasm of prostate ([place]) 01/09/2014   Neuropathy    Pancreatitis    Prostate cancer ([place]) 12/19/2013   Gleason 4+3=7, volume 31.31 cc   Sciatica    Shortness of breath dyspnea    with exertion     Past Surgical History:  Procedure Laterality Date   BIOPSY  04/16/2018   Procedure: BIOPSY;  Surgeon: Juanita Craver, MD;  Location: [facility];  Service: Endoscopy;;   BIOPSY  05/23/2020   Procedure: BIOPSY;  Surgeon: Jackquline Denmark, MD;  Location: [facility];  Service: Gastroenterology;;  EGD and COLON   BIOPSY  12/09/2020   Procedure: BIOPSY;  Surgeon: Thornton Park, MD;   Location: [facility];  Service: Gastroenterology;;   biopsy on throat     hx of    CATARACT EXTRACTION Bilateral    Dr. Quentin Ore   COLONOSCOPY N/A 05/23/2020   Procedure: COLONOSCOPY;  Surgeon: Jackquline Denmark, MD;  Location: [facility];  Service: Gastroenterology;  Laterality: N/A;   ENTEROSCOPY N/A 10/07/2020   Procedure: ENTEROSCOPY;  Surgeon: [name], MD;  Location: [facility];  Service: Endoscopy;  Laterality: N/A;   ESOPHAGOGASTRODUODENOSCOPY Left 04/16/2018   Procedure: ESOPHAGOGASTRODUODENOSCOPY (EGD);  Surgeon: Juanita Craver, MD;  Location: [facility];  Service: Endoscopy;  Laterality: Left;   ESOPHAGOGASTRODUODENOSCOPY (EGD) WITH PROPOFOL N/A 05/23/2020   Procedure: ESOPHAGOGASTRODUODENOSCOPY (EGD) WITH PROPOFOL;  Surgeon: Jackquline Denmark, MD;  Location: [facility];  Service: Gastroenterology;  Laterality: N/A;   ESOPHAGOGASTRODUODENOSCOPY (EGD) WITH PROPOFOL N/A 12/09/2020   Procedure: ESOPHAGOGASTRODUODENOSCOPY (EGD) WITH PROPOFOL;  Surgeon: Thornton Park, MD;  Location: [facility];  Service: Gastroenterology;  Laterality: N/A;   EYE SURGERY     FOOT SURGERY     HOT HEMOSTASIS N/A 04/16/2018   Procedure: HOT HEMOSTASIS (ARGON PLASMA COAGULATION/BICAP);  Surgeon: Juanita Craver, MD;  Location: [facility];  Service: Endoscopy;  Laterality: N/A;   HOT HEMOSTASIS N/A 05/23/2020   Procedure: HOT HEMOSTASIS (ARGON PLASMA COAGULATION/BICAP);  Surgeon: Jackquline Denmark, MD;  Location:  [facility];  Service: Gastroenterology;  Laterality: N/A;   HOT HEMOSTASIS N/A 12/09/2020   Procedure: HOT HEMOSTASIS (ARGON PLASMA COAGULATION/BICAP);  Surgeon: Thornton Park, MD;  Location: [facility];  Service: Gastroenterology;  Laterality: N/A;   LYMPHADENECTOMY Bilateral 02/27/2014   Procedure: BILATERAL LYMPHADENECTOMY;  Surgeon: Alexis Frock, MD;  Location: [facility];  Service: Urology;  Laterality: Bilateral;   POLYPECTOMY  05/23/2020   Procedure: POLYPECTOMY;  Surgeon: Jackquline Denmark, MD;  Location: [facility];  Service: Gastroenterology;;   PROSTATE BIOPSY  12/2013   Gleason 4+3=7, volume 31.31 cc   ROBOT ASSISTED LAPAROSCOPIC RADICAL PROSTATECTOMY N/A 02/27/2014   Procedure: ROBOTIC ASSISTED LAPAROSCOPIC RADICAL PROSTATECTOMY WITH INDOCYANINE GREEN DYE;  Surgeon: Alexis Frock, MD;  Location: [facility];  Service: Urology;  Laterality: N/A;    There were no vitals filed for this visit.   Subjective Assessment - 01/07/21 1021     Subjective Pt saw neurologist with no changes per note. Reports still getting a little dizziness but not bad.    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 No/denies                               [facility] Adult PT Treatment/Exercise - 01/07/21 1025       Ambulation/Gait   Ambulation/Gait Yes    Ambulation/Gait Assistance 4: Min guard    Ambulation/Gait Assistance Details Pt was cued to keep cane to side a little more. Does have more narrow BOS at times with right leg crossing over some. BP=112/58 after SciFit and walking    Ambulation Distance (Feet) 300 Feet    Assistive device Straight cane    Gait Pattern Step-through pattern;Decreased arm swing - left;Narrow base of support    Ambulation Surface Level;Indoor      Neuro Re-ed    Neuro Re-ed Details  Gait over obstacles: weaving in and out of 4 cones and then reciprocal steps over 4 low orange hurdles x 3 bouts CGA with cane. Pt was cued to try keep feet a little further apart as tends to cross right foot over some with very narrow BOS. Standing with tapping 3 different colored cones CGA/min assist 1 min each leg then seated break then repeated again 1 min each leg. Pt had more trouble with tapping with RLE with some  pelvic drop noted as well.      Exercises   Exercises Other Exercises      Knee/Hip Exercises: Aerobic   Other Aerobic SciFit x 6 min level 5 with BUE and BLE for strengthening and warm up to try to get BP up a bit with standing activities.                     PT Education - 01/07/21 1347     Education Details PT discussed importance of changing positions slowly and standing for a minute when first getting up.    Person(s) Educated Patient    Methods Explanation    Comprehension Verbalized understanding              PT Short Term Goals - 12/25/20 0856       PT SHORT TERM GOAL #1   Title  Pt will be independent with initial HEP in order to build up functional gains made in therapy. ALL STGS DUE 12/23/20    Baseline 12/25/20 PT had not started HEP due to health issues prior to hospital stay    Time 4    Period Weeks    Status Not Met    Target Date 12/23/20      PT SHORT TERM GOAL #2   Title Pt will decr 5x sit <> stand time to 18 seconds or less in order to demo improved functional BLE strength.    Baseline 20.88 seconds without UE support. 12/25/20 5 x sit to stand=16.17 sec without hands    Time 4    Period Weeks    Status Achieved      PT SHORT TERM GOAL #3   Title Pt will improve gait speed with no AD to at least 3.3 ft/sec in order to demo improved gait efficiency.    Baseline 2.93 ft/sec. 12/25/20 2.23ft/sec    Time 4    Period Weeks    Status Not Met      PT SHORT TERM GOAL #4   Title Pt and pt's family will verbalize understanding of fall prevention in the home in order to demo decr fall risk.    Baseline 12/25/20 PT educated on fall prevention strategies around home and pt and his neice verbalized understanding.    Time 4    Period Weeks    Status Achieved      PT SHORT TERM GOAL #5   Title Pt will improve FGA score to at least a 19/30 in order to demo decr fall risk.    Baseline 16/30. 12/25/20 FGA=18/30    Time 4    Period Weeks    Status Partially  Met               PT Long Term Goals - 11/25/20 1757       PT LONG TERM GOAL #1   Title Pt will be independent with final HEP in order to build up functional gains made in therapy. ALL LTGS DUE 01/20/21    Time 8    Period Weeks    Status New    Target Date 01/20/21      PT LONG TERM GOAL #2   Title Pt will decr 5x sit <> stand time to 16 seconds or less in order to demo improved functional BLE strength.    Baseline 20.88 seconds without UE support    Time 8    Period Weeks    Status New      PT LONG TERM GOAL #3   Title Pt will ambulate at least 300' outdoors over unlevel surfaces with supervision in order to demo improved community mobility.    Time 8    Period Weeks    Status New      PT LONG TERM GOAL #4   Title Pt will improve FGA score to at least a 22/30 in order to demo decr fall risk.    Baseline 16/30    Time 8    Period Weeks    Status New      PT LONG TERM GOAL #5   Title Pt will improve condition 4 of mCTSIB to at least 20 seconds in order to demo improved vestibular input for balance.    Baseline 7 seconds    Time 8    Period Weeks    Status New  Plan - 01/07/21 1348     Clinical Impression Statement Pt did not report any dizziness during session today and BP only dropped  a little from sit to stand. Also tried to perform some seated exercise on SciFit prior to performing more standing activities. Pt is unsteady at times with narrow BOS with RLE crossing over some.    Personal Factors and Comorbidities Comorbidity 3+;Past/Current Experience;Time since onset of injury/illness/exacerbation    Comorbidities CVA, alcohol use, diabetes, hyperlipidemia, hypertension, iron deficiency anemia, depression, GERD, prostate cancer    Examination-Activity Limitations Stairs;Transfers;Locomotion Level    Examination-Participation Restrictions Community Activity;Driving    Stability/Clinical Decision Making Stable/Uncomplicated    Rehab  Potential Good    PT Frequency 2x / week   12 visits over 8 weeks   PT Duration 8 weeks   12 visits over 8 weeks   PT Treatment/Interventions ADLs/Self Care Home Management;DME Instruction;Gait training;Stair training;Therapeutic activities;Functional mobility training;Therapeutic exercise;Balance training;Patient/family education;Neuromuscular re-education;Vestibular    PT Next Visit Plan Check BP - has had issues with orthostatic BPs.  Continue with balance training and gait on varied surfaces. BLE strengthening, SLS tasks. try Scifit/Nustep for strength/activity tolerance    PT Home Exercise Plan Access Code [email or web address]    Consulted and Agree with Plan of Care Patient;Family member/caregiver    Family Member Consulted Sister             Patient will benefit from skilled therapeutic intervention in order to improve the following deficits and impairments:  Abnormal gait, Decreased balance, Decreased strength, Impaired sensation, Postural dysfunction, Difficulty walking, Decreased activity tolerance  Visit Diagnosis: Other abnormalities of gait and mobility  Muscle weakness (generalized)  Unsteadiness on feet     Problem List Patient Active Problem List   Diagnosis Date Noted   Hospital discharge follow-up 12/24/2020   Weakness of both legs    Adenomatous duodenal polyp    AVM (arteriovenous malformation) of duodenum, acquired with hemorrhage    Need for hepatitis C screening test 11/10/2020   Pneumococcal vaccination administered at current visit 11/10/2020   Healthcare maintenance 11/06/2020   Hypoglycemia 83/12/4074   Acute metabolic encephalopathy 80/88/1103   Acute on chronic anemia 10/22/2020   Angiodysplasia of intestine    GI bleed 10/05/2020   Melena    Upper GI bleed    Iron deficiency anemia 05/22/2020   Constipation 05/22/2020   GIB (gastrointestinal bleeding) 05/21/2020   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 ([place])    History of CVA  (cerebrovascular accident) 04/23/2020   Hyperlipidemia associated with type 2 diabetes mellitus ([place]) 04/23/2020   Alcohol use 04/23/2020   AKI (acute kidney injury) ([place]) 04/23/2020   Tobacco use 04/23/2020   Unresponsive 11/08/2018   Syncopal seizure ([place]) 11/08/2018   Hypertension associated with diabetes ([place]) 11/08/2018   Alcohol withdrawal seizure with complication, with unspecified complication ([place]) 15/94/5859   Type 2 diabetes mellitus without complication ([place]) 29/24/4628   Symptomatic anemia 04/15/2018   Prostate cancer ([place]) 02/27/2014   Malignant neoplasm of prostate ([place]) 01/09/2014   Bunion 01/17/2013   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    Electa Sniff, PT, DPT, NCS 01/07/2021, 1:51 PM  [facility] [address] [place], [place], [address] Phone: [phone]   Fax:  [phone]  Name: Gregory Nunez. MRN: [id number] Date of Birth: 1945/09/26

## 2021-01-07 NOTE — Patient Instructions (Signed)
[name] technique - do this 3 or 4 days a week with Gregory Nunez. It will help his word finding.   1) Choose a common action (verb) from the list of verbs provided.   2) Think of 3 people (or "subjects") who could perform the action, or start by thinking of 3 objects the action could be done to. It might be easier to work on one complete set at a time, thinking of the object and person/subject together. The goal is to be as specific as possible with the nouns, so "farmer" would be a better subject than "man" for the verb drive. Write each subject and each object down on the paper in the appropriate column so you'll have 3 triads (subject-verb-object). It's okay to get personal. Family members, friends, and pets make great subjects for [name]! Just try to vary the responses, so not all the nouns are personal or there is just one type of object. Similarly, try to use many different meanings of the verb if possible.   3) Read each triad of words aloud when you write them down on the lines below the chart. It's not important to conjugate the verb or add any articles to the nouns ("farmer drive tractor"), but it's okay if you do ("the farmer drives the tractor").   4) Select one of the three triads to expand upon. Ask "WHERE" it happens, "WHY" it happens, and "WHEN" it happens. Try to get as specific as possible, and write the responses down on the last line following the triad you chose to expand. Then, read the expanded sentence with the three answers to the questions. Again, the grammar doesn't matter as much as the focus is on connecting the concepts.

## 2021-01-07 NOTE — Therapy (Signed)
[facility] [address] [place], [place], [address] Phone: [phone]   Fax:  [phone]  Occupational Therapy Treatment  Patient Details  Name: Gregory Nunez. MRN: [id number] Date of Birth: 06-04-1945 Referring Provider (OT): Dr. Farrel Gordon   Encounter Date: 01/07/2021   OT End of Session - 01/07/21 0917     Visit Number 4    Number of Visits 21    Date for OT Re-Evaluation 02/07/21    Authorization Type UHC MCR/MCD    Progress Note Due on Visit 10    OT Start Time [id number]    OT Stop Time 0930    OT Time Calculation (min) 43 min    Activity Tolerance Patient tolerated treatment well    Behavior During Therapy WFL for tasks assessed/performed             Past Medical History:  Diagnosis Date   CAO (chronic airflow obstruction) (HCC)    Cataract    OD   CVA (cerebral vascular accident) ([place])    Depression    Diabetes mellitus without complication ([place])    Diabetic retinopathy ([place])    NPDR OU   GERD (gastroesophageal reflux disease)    if drinks alcohol   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   Patient is approximately 5-week status post bunion correction left foot   Hyperlipidemia    Hypertension    Hypertensive retinopathy    OU   Malignant neoplasm of prostate ([place]) 01/09/2014   Neuropathy    Pancreatitis    Prostate cancer ([place]) 12/19/2013   Gleason 4+3=7, volume 31.31 cc   Sciatica    Shortness of breath dyspnea    with exertion     Past Surgical History:  Procedure Laterality Date   BIOPSY  04/16/2018   Procedure: BIOPSY;  Surgeon: Juanita Craver, MD;  [facility]: [facility];  Service: Endoscopy;;   BIOPSY  05/23/2020   Procedure: BIOPSY;  Surgeon: Jackquline Denmark, MD;  [facility]: [facility];  Service: Gastroenterology;;  EGD and COLON   BIOPSY  12/09/2020   Procedure: BIOPSY;  Surgeon: Thornton Park, MD;  Location: [facility];  Service: Gastroenterology;;   biopsy on  throat     hx of    CATARACT EXTRACTION Bilateral    Dr. Quentin Ore   COLONOSCOPY N/A 05/23/2020   Procedure: COLONOSCOPY;  Surgeon: Jackquline Denmark, MD;  [facility];  Service: Gastroenterology;  Laterality: N/A;   ENTEROSCOPY N/A 10/07/2020   Procedure: ENTEROSCOPY;  Surgeon: [name], MD;  Location: [facility] ENDOSCOPY;  Service: Endoscopy;  Laterality: N/A;   ESOPHAGOGASTRODUODENOSCOPY Left 04/16/2018   Procedure: ESOPHAGOGASTRODUODENOSCOPY (EGD);  Surgeon: Juanita Craver, MD;  Location: [facility];  Service: Endoscopy;  Laterality: Left;   ESOPHAGOGASTRODUODENOSCOPY (EGD) WITH PROPOFOL N/A 05/23/2020   Procedure: ESOPHAGOGASTRODUODENOSCOPY (EGD) WITH PROPOFOL;  Surgeon: Jackquline Denmark, MD;  [facility];  Service: Gastroenterology;  Laterality: N/A;   ESOPHAGOGASTRODUODENOSCOPY (EGD) WITH PROPOFOL N/A 12/09/2020   Procedure: ESOPHAGOGASTRODUODENOSCOPY (EGD) WITH PROPOFOL;  Surgeon: Thornton Park, MD;  Location: [facility];  Service: Gastroenterology;  Laterality: N/A;   EYE SURGERY     FOOT SURGERY     HOT HEMOSTASIS N/A 04/16/2018   Procedure: HOT HEMOSTASIS (ARGON PLASMA COAGULATION/BICAP);  Surgeon: Juanita Craver, MD;  Location: [facility];  Service: Endoscopy;  Laterality: N/A;   HOT HEMOSTASIS N/A 05/23/2020   Procedure: HOT HEMOSTASIS (ARGON PLASMA COAGULATION/BICAP);  Surgeon: Jackquline Denmark, MD;  Location: [facility];  Service: Gastroenterology;  Laterality: N/A;  HOT HEMOSTASIS N/A 12/09/2020   Procedure: HOT HEMOSTASIS (ARGON PLASMA COAGULATION/BICAP);  Surgeon: Thornton Park, MD;  Location: [facility];  Service: Gastroenterology;  Laterality: N/A;   LYMPHADENECTOMY Bilateral 02/27/2014   Procedure: BILATERAL LYMPHADENECTOMY;  Surgeon: Alexis Frock, MD;  [facility];  Service: Urology;  Laterality: Bilateral;   POLYPECTOMY  05/23/2020   Procedure: POLYPECTOMY;  Surgeon: Jackquline Denmark, MD;  [facility];  Service: Gastroenterology;;    PROSTATE BIOPSY  12/2013   Gleason 4+3=7, volume 31.31 cc   ROBOT ASSISTED LAPAROSCOPIC RADICAL PROSTATECTOMY N/A 02/27/2014   Procedure: ROBOTIC ASSISTED LAPAROSCOPIC RADICAL PROSTATECTOMY WITH INDOCYANINE GREEN DYE;  Surgeon: Alexis Frock, MD;  [facility];  Service: Urology;  Laterality: N/A;    There were no vitals filed for this visit.   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 No/denies             BP = 141/70, HR = 93  Pt making sandwich w/ close supervision and cues to use one hand countertop support for balance. If pt required 2 hands, recommended pt lean hips against counter for support. Pt required cues t/o task to remember this. Pt also forgot where 1 items went. Pt washed plate and butter knife. Required 1 rest break for task and extra time to complete.   Pt copying small peg design with Rt hand w/ 100% accuracy and improvements in Rt hand. However, pt occasionally tried to switch to using Lt hand and required 2 reminders to keep using Rt hand                       OT Short Term Goals - 01/07/21 0917       OT SHORT TERM GOAL #1   Title Independent with initial HEP for bilateral coordination and Rt grip strength    Time 5    Period Weeks    Status On-going      OT SHORT TERM GOAL #2   Title Pt/family to verbalize acquisition of tub bench to prevent falls    Time 5    Period Weeks    Status Achieved      OT SHORT TERM GOAL #3   Title Pt to consistently perform snack prep and heat up microwaveable items    Time 5    Period Weeks    Status On-going   cues for safety/fall prevention     OT SHORT TERM GOAL #4   Title Pt to wash dishes and fold towels consistently    Time 5    Period Weeks     Status On-going   washed 2 dishes in clinic on 01/07/21 w/ sup     OT SHORT TERM GOAL #5   Title Pt to improve coordination bilaterally by 10 sec    Baseline Rt = 1 min, Lt = 47 sec    Time 5    Period Weeks    Status New               OT Long Term Goals - 11/25/20 1937       OT LONG TERM GOAL #1   Title Independent with UE HEP for strengthening    Time  10    Period Weeks    Status New      OT LONG TERM GOAL #2   Title Pt to improve Rt grip strength by 5 lbs or greater    Baseline 51 lbs (Lt = 60 lbs)    Time 10    Period Weeks    Status New      OT LONG TERM GOAL #3   Title Pt to perform simple cooking task (egg) with distant supervision    Time 10    Period Weeks    Status New      OT LONG TERM GOAL #4   Title Pt to consistently perform light IADLS (all cleaning) at mod I level    Time 10    Period Weeks    Status New      OT LONG TERM GOAL #5   Title Pt to perform simple financial management with direct supervision    Time 10    Period Weeks    Status New                   Plan - 01/07/21 4580     Clinical Impression Statement Pt progressing towards goals. Pt required cues to hold onto countertop for balance during kitchen activity - limited by decreased memory and cognition    OT Occupational Profile and History Detailed Assessment- Review of Records and additional review of physical, cognitive, psychosocial history related to current functional performance    Occupational performance deficits (Please refer to evaluation for details): IADL's;ADL's;Social Participation    Body Structure / Function / Physical Skills Strength;Dexterity;UE functional use;IADL;Endurance;Mobility;Coordination;FMC;Vision    Cognitive Skills Problem Solve;Sequencing;Perception;Learn    Rehab Potential Good    Clinical Decision Making Several treatment options, min-mod task modification necessary    Comorbidities Affecting Occupational Performance: Presence of  comorbidities impacting occupational performance    Comorbidities impacting occupational performance description: seizure, diabetic retinopathy    Modification or Assistance to Complete Evaluation  No modification of tasks or assist necessary to complete eval    OT Frequency 2x / week    OT Duration --   10 weeks (anticipate only 6-8 weeks)   OT Treatment/Interventions Self-care/ADL training;DME and/or AE instruction;Therapeutic activities;Therapeutic exercise;Cognitive remediation/compensation;Coping strategies training;Visual/perceptual remediation/compensation;Functional Mobility Training;Neuromuscular education;Manual Therapy;Patient/family education    Plan continue coordination Rt hand, light IADLS (monitor BP), work on basic math for financial management prep    Consulted and Agree with Plan of Care Patient;Family member/caregiver    Family Member Consulted niece [name])             Patient will benefit from skilled therapeutic intervention in order to improve the following deficits and impairments:   Body Structure / Function / Physical Skills: Strength, Dexterity, UE functional use, IADL, Endurance, Mobility, Coordination, FMC, Vision Cognitive Skills: Problem Solve, Sequencing, Perception, Learn     Visit Diagnosis: Other lack of coordination  Unsteadiness on feet    Problem List Patient Active Problem List   Diagnosis Date Noted   Hospital discharge follow-up 12/24/2020   Weakness of both legs    Adenomatous duodenal polyp    AVM (arteriovenous malformation) of duodenum, acquired with hemorrhage    Need for hepatitis C screening test 11/10/2020   Pneumococcal vaccination administered at current visit 11/10/2020   Healthcare maintenance 11/06/2020   Hypoglycemia 99/83/3825   Acute metabolic encephalopathy 05/39/7673   Acute on chronic anemia 10/22/2020   Angiodysplasia of intestine    GI bleed 10/05/2020   Melena  Upper GI bleed    Iron deficiency anemia  05/22/2020   Constipation 05/22/2020   GIB (gastrointestinal bleeding) 05/21/2020   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 ([place])    History of CVA (cerebrovascular accident) 04/23/2020   Hyperlipidemia associated with type 2 diabetes mellitus ([place]) 04/23/2020   Alcohol use 04/23/2020   AKI (acute kidney injury) ([place]) 04/23/2020   Tobacco use 04/23/2020   Unresponsive 11/08/2018   Syncopal seizure ([place]) 11/08/2018   Hypertension associated with diabetes ([place]) 11/08/2018   Alcohol withdrawal seizure with complication, with unspecified complication ([place]) 98/26/4158   Type 2 diabetes mellitus without complication ([place]) 30/94/0768   Symptomatic anemia 04/15/2018   Prostate cancer ([place]) 02/27/2014   Malignant neoplasm of prostate ([place]) 01/09/2014   Bunion 01/17/2013   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    Carey Bullocks, OTR/L 01/07/2021, 9:23 AM  [facility] [address] [place], [place], [address] Phone: [phone]   Fax:  [phone]  Name: Julen Rubert. MRN: [id number] Date of Birth: [birth year]

## 2021-01-07 NOTE — Therapy (Signed)
[facility] [address] [place], [place], [address] Phone: [phone]   Fax:  [phone]  Speech Language Pathology Treatment  Patient Details  Name: Gregory Nunez. MRN: [id number] Date of Birth: [birth year] Referring Provider (SLP): Dr. Velna Ochs   Encounter Date: 01/07/2021   End of Session - 01/07/21 1025     Visit Number 8    Number of Visits 25    Date for SLP Re-Evaluation 02/09/21    SLP Start Time 0935    SLP Stop Time  1016    SLP Time Calculation (min) 41 min    Activity Tolerance Patient tolerated treatment well             Past Medical History:  Diagnosis Date   CAO (chronic airflow obstruction) ([place])    Cataract    OD   CVA (cerebral vascular accident) ([place])    Depression    Diabetes mellitus without complication ([place])    Diabetic retinopathy ([place])    NPDR OU   GERD (gastroesophageal reflux disease)    if drinks alcohol   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   Patient is approximately 5-week status post bunion correction left foot   Hyperlipidemia    Hypertension    Hypertensive retinopathy    OU   Malignant neoplasm of prostate ([place]) 01/09/2014   Neuropathy    Pancreatitis    Prostate cancer ([place]) 12/19/2013   Gleason 4+3=7, volume 31.31 cc   Sciatica    Shortness of breath dyspnea    with exertion     Past Surgical History:  Procedure Laterality Date   BIOPSY  04/16/2018   Procedure: BIOPSY;  Surgeon: Juanita Craver, MD;  [facility];  Service: Endoscopy;;   BIOPSY  05/23/2020   Procedure: BIOPSY;  Surgeon: Jackquline Denmark, MD;  [facility];  Service: Gastroenterology;;  EGD and COLON   BIOPSY  12/09/2020   Procedure: BIOPSY;  Surgeon: Thornton Park, MD;  Location: [facility] ENDOSCOPY;  Service: Gastroenterology;;   biopsy on throat     hx of    CATARACT EXTRACTION Bilateral    Dr. Quentin Ore   COLONOSCOPY N/A 05/23/2020   Procedure:  COLONOSCOPY;  Surgeon: Jackquline Denmark, MD;  [facility];  Service: Gastroenterology;  Laterality: N/A;   ENTEROSCOPY N/A 10/07/2020   Procedure: ENTEROSCOPY;  Surgeon: [name], MD;  Location: [facility]COPY;  Service: Endoscopy;  Laterality: N/A;   ESOPHAGOGASTRODUODENOSCOPY Left 04/16/2018   Procedure: ESOPHAGOGASTRODUODENOSCOPY (EGD);  Surgeon: Juanita Craver, MD;  Location: [facility];  Service: Endoscopy;  Laterality: Left;   ESOPHAGOGASTRODUODENOSCOPY (EGD) WITH PROPOFOL N/A 05/23/2020   Procedure: ESOPHAGOGASTRODUODENOSCOPY (EGD) WITH PROPOFOL;  Surgeon: Jackquline Denmark, MD;  [facility];  Service: Gastroenterology;  Laterality: N/A;   ESOPHAGOGASTRODUODENOSCOPY (EGD) WITH PROPOFOL N/A 12/09/2020   Procedure: ESOPHAGOGASTRODUODENOSCOPY (EGD) WITH PROPOFOL;  Surgeon: Thornton Park, MD;  Location: [facility];  Service: Gastroenterology;  Laterality: N/A;   EYE SURGERY     FOOT SURGERY     HOT HEMOSTASIS N/A 04/16/2018   Procedure: HOT HEMOSTASIS (ARGON PLASMA COAGULATION/BICAP);  Surgeon: Juanita Craver, MD;  Location: [facility];  Service: Endoscopy;  Laterality: N/A;   HOT HEMOSTASIS N/A 05/23/2020   Procedure: HOT HEMOSTASIS (ARGON PLASMA COAGULATION/BICAP);  Surgeon: Jackquline Denmark, MD;  Location: [facility];  Service: Gastroenterology;  Laterality: N/A;   HOT HEMOSTASIS N/A 12/09/2020   Procedure: HOT HEMOSTASIS (ARGON PLASMA COAGULATION/BICAP);  Surgeon: Thornton Park, MD;  Location: [facility];  Service:  Gastroenterology;  Laterality: N/A;   LYMPHADENECTOMY Bilateral 02/27/2014   Procedure: BILATERAL LYMPHADENECTOMY;  Surgeon: Alexis Frock, MD;  [facility];  Service: Urology;  Laterality: Bilateral;   POLYPECTOMY  05/23/2020   Procedure: POLYPECTOMY;  Surgeon: Jackquline Denmark, MD;  [facility];  Service: Gastroenterology;;   PROSTATE BIOPSY  12/2013   Gleason 4+3=7, volume 31.31 cc   ROBOT ASSISTED LAPAROSCOPIC RADICAL PROSTATECTOMY N/A  02/27/2014   Procedure: ROBOTIC ASSISTED LAPAROSCOPIC RADICAL PROSTATECTOMY WITH INDOCYANINE GREEN DYE;  Surgeon: Alexis Frock, MD;  [facility];  Service: Urology;  Laterality: N/A;    There were no vitals filed for this visit.   Subjective Assessment - 01/07/21 0935     Subjective Pt arrives alone today to ST, from OT.    Currently in Pain? No/denies                   ADULT SLP TREATMENT - 01/07/21 0935       General Information   Behavior/Cognition Alert;Cooperative;Pleasant mood;Requires cueing      Treatment Provided   Treatment provided Cognitive-Linquistic      Cognitive-Linquistic Treatment   Treatment focused on Cognition;Aphasia    Skilled Treatment Pt alone today, and without any binder. When asked why he was coming to [facility] told SLP that he seems to still have difficulty with word finding, not any specific time of day: "Any time of the day, really." SLP again targeted verb network strengthening treatment ([name]) with pt. Mahlon generated three subjects and three objects with consistent mod semantic cues and extra time due to processing deficits. Pt req'd consistent min-mod cues for spelling, and rare min A for copying words correctly. At end of session SLP provided him with a handout of directions and of verbs to use with this treatment so that he can do one x3-4 at home with family. Detailed instructions were highlighted for Pam when pt sees her again after this therapy session.      Assessment / Recommendations / Plan   Plan Continue with current plan of care      Progression Toward Goals   Progression toward goals Progressing toward goals   SLP ? how much work is done at home             SLP Education - 01/07/21 1025     Education Details [name] procedure    Person(s) Educated Patient    Methods Explanation;Handout;Verbal cues;Demonstration    Comprehension Verbalized understanding;Verbal cues required;Need further instruction               SLP Short Term Goals - 12/31/20 1120       SLP SHORT TERM GOAL #1   Title Pt will use external aids to recall am meds with 3 or less reminders from family over 1 week    Baseline family remidning his consistently every day    Time 1    Period Weeks    Status Not Met      SLP SHORT TERM GOAL #2   Title Giacomo will carryover 2 compensations for memory and attention to complete 3 house hold chores a week with occasiona min A from family    Baseline not completing house hold chores    Time 1    Period Weeks    Status Not Met      SLP SHORT TERM GOAL #3   Title 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 18/20 sentences with occasional min A  Time 1    Period Weeks    Status Not Met      SLP SHORT TERM GOAL #4   Title Pt will name 10 items for personally relevant category using verbal compensations as needed with occasional min A over 2 sessions    Time 1    Period Weeks    Status Not Met      SLP SHORT TERM GOAL #5   Title Pt will fill out 2 money orders to pay bills with occasional min A from family    Baseline Nephew is paying his bills for him    Time 1    Period Weeks    Status Not Met              SLP Long Term Goals - 01/07/21 1026       SLP LONG TERM GOAL #1   Title Pt will use external aids and compensations to manage his medications, appointments and daily to do list with occasional min A from family    Baseline Family managing meds and appointments    Time 6    Period Weeks    Status On-going      SLP LONG TERM GOAL #2   Title 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 over 15 minute conversation to be 95% intelligible with occasional min A    Time 6    Period Weeks    Status On-going      SLP LONG TERM GOAL #3   Title Pt will ID aphasic errors in simple conversation with 3 or less verbal cues and attempt to self correct errors    Baseline Pt unaware of errors    Time 6    Period Weeks    Status On-going       SLP LONG TERM GOAL #4   Title Pt will use organization system, calendar to pay 3 bills on time with occasional min A from family.    Baseline family managing his finances    Time 6    Period Weeks    Status On-going      SLP LONG TERM GOAL #5   Title Family will rate [place]' communciation on The Communicative Participation Item [employer] General Short Form a 12 or higher    Baseline rated a score of 9    Time 6    Period Weeks    Status On-going      SLP LONG TERM GOAL #6   Title Pt will carryover 3 strategies for attention and memory to cook3  simple meals with supervision from family    Baseline not cooking    Time 6    Period Weeks    Status On-going              Plan - 01/07/21 1026     Clinical Impression Statement Moderate aphasia and cognitive impairments including memory, awareness, executive function, and attention. Word finding in structured tasks required mod A. Initiated training in compensatory strategies for cognition and aphasia, as well as cognitive and language activities to do at home. Pt continues to required supervision/family A for IADL's. Continue skilled ST to maximize cognition and language for improved independence, safety and pt's goal to return to his home independently.    Speech Therapy Frequency 2x / week    Duration 12 weeks    Treatment/Interventions Cueing hierarchy;Environmental controls;Language facilitation;SLP instruction and feedback;Compensatory strategies;Functional tasks;Cognitive reorganization;Compensatory techniques;Internal/external aids;Multimodal communcation approach;Patient/family education    Potential to [place]  Potential Considerations Co-morbidities;Previous level of function;Cooperation/participation level             Patient will benefit from skilled therapeutic intervention in order to improve the following deficits and impairments:   Aphasia  Cognitive communication deficit    Problem  List Patient Active Problem List   Diagnosis Date Noted   Hospital discharge follow-up 12/24/2020   Weakness of both legs    Adenomatous duodenal polyp    AVM (arteriovenous malformation) of duodenum, acquired with hemorrhage    Need for hepatitis C screening test 11/10/2020   Pneumococcal vaccination administered at current visit 11/10/2020   Healthcare maintenance 11/06/2020   Hypoglycemia 25/52/5894   Acute metabolic encephalopathy 83/47/5830   Acute on chronic anemia 10/22/2020   Angiodysplasia of intestine    GI bleed 10/05/2020   Melena    Upper GI bleed    Iron deficiency anemia 05/22/2020   Constipation 05/22/2020   GIB (gastrointestinal bleeding) 05/21/2020   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 ([place])    History of CVA (cerebrovascular accident) 04/23/2020   Hyperlipidemia associated with type 2 diabetes mellitus ([place]) 04/23/2020   Alcohol use 04/23/2020   AKI (acute kidney injury) ([place]) 04/23/2020   Tobacco use 04/23/2020   Unresponsive 11/08/2018   Syncopal seizure ([place]) 11/08/2018   Hypertension associated with diabetes ([place]) 11/08/2018   Alcohol withdrawal seizure with complication, with unspecified complication ([facility]) 74/60/0298   Type 2 diabetes mellitus without complication ([place]) 47/30/8569   Symptomatic anemia 04/15/2018   Prostate cancer ([place]) 02/27/2014   Malignant neoplasm of prostate ([place]) 01/09/2014   Bunion 01/17/2013   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    Gregory Nunez ,MS, CCC-SLP  01/07/2021, 10:27 AM  [facility] [address] [place], [place], [address] Phone: [phone]   Fax:  [phone]   Name: Gregory Nunez. MRN: [id number] Date of Birth: 12/23/1945

## 2021-01-12 ENCOUNTER — Ambulatory Visit: Payer: Medicare Other | Admitting: Occupational Therapy

## 2021-01-12 ENCOUNTER — Other Ambulatory Visit: Payer: Self-pay

## 2021-01-12 ENCOUNTER — Ambulatory Visit: Payer: Medicare Other | Admitting: Speech Pathology

## 2021-01-12 ENCOUNTER — Encounter: Payer: Self-pay | Admitting: Physical Therapy

## 2021-01-12 ENCOUNTER — Encounter: Payer: Self-pay | Admitting: Speech Pathology

## 2021-01-12 ENCOUNTER — Ambulatory Visit: Payer: Medicare Other | Admitting: Physical Therapy

## 2021-01-12 VITALS — BP 124/60 | HR 104

## 2021-01-12 DIAGNOSIS — M6281 Muscle weakness (generalized): Secondary | ICD-10-CM

## 2021-01-12 DIAGNOSIS — R2689 Other abnormalities of gait and mobility: Secondary | ICD-10-CM | POA: Diagnosis not present

## 2021-01-12 DIAGNOSIS — R278 Other lack of coordination: Secondary | ICD-10-CM

## 2021-01-12 DIAGNOSIS — R2681 Unsteadiness on feet: Secondary | ICD-10-CM

## 2021-01-12 DIAGNOSIS — I69318 Other symptoms and signs involving cognitive functions following cerebral infarction: Secondary | ICD-10-CM

## 2021-01-12 DIAGNOSIS — R41841 Cognitive communication deficit: Secondary | ICD-10-CM

## 2021-01-12 NOTE — Therapy (Signed)
[facility] [address] [place], [place], [address] Phone: [phone]   Fax:  [phone]  Occupational Therapy Treatment  Patient Details  Name: Gregory Nunez. MRN: [id number] Date of Birth: [birth year] Referring Provider (OT): Dr. Farrel Gordon   Encounter Date: 01/12/2021   OT End of Session - 01/12/21 1046     Visit Number 5    Number of Visits 21    Date for OT Re-Evaluation 02/07/21    Authorization Type UHC MCR/MCD    Progress Note Due on Visit 10    OT Start Time 1020    OT Stop Time 1100    OT Time Calculation (min) 40 min    Activity Tolerance Patient tolerated treatment well    Behavior During Therapy WFL for tasks assessed/performed             Past Medical History:  Diagnosis Date   CAO (chronic airflow obstruction) (HCC)    Cataract    OD   CVA (cerebral vascular accident) ([place])    Depression    Diabetes mellitus without complication ([place])    Diabetic retinopathy ([place])    NPDR OU   GERD (gastroesophageal reflux disease)    if drinks alcohol   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   Patient is approximately 5-week status post bunion correction left foot   Hyperlipidemia    Hypertension    Hypertensive retinopathy    OU   Malignant neoplasm of prostate ([place]) 01/09/2014   Neuropathy    Pancreatitis    Prostate cancer ([place]) 12/19/2013   Gleason 4+3=7, volume 31.31 cc   Sciatica    Shortness of breath dyspnea    with exertion     Past Surgical History:  Procedure Laterality Date   BIOPSY  04/16/2018   Procedure: BIOPSY;  Surgeon: Juanita Craver, MD;  [facility]: [facility];  Service: Endoscopy;;   BIOPSY  05/23/2020   Procedure: BIOPSY;  Surgeon: Jackquline Denmark, MD;  [facility];  Service: Gastroenterology;;  EGD and COLON   BIOPSY  12/09/2020   Procedure: BIOPSY;  Surgeon: Thornton Park, MD;  [facility] END[facility]COPY;  Service: Gastroenterology;;   biopsy on  throat     hx of    CATARACT EXTRACTION Bilateral    Dr. Quentin Ore   COLONOSCOPY N/A 05/23/2020   Procedure: COLONOSCOPY;  Surgeon: Jackquline Denmark, MD;  [facility];  Service: Gastroenterology;  Laterality: N/A;   ENTEROSCOPY N/A 10/07/2020   Procedure: ENTEROSCOPY;  Surgeon: [name], MD;  Location: [facility] ENDOSCOPY;  Service: Endoscopy;  Laterality: N/A;   ESOPHAGOGASTRODUODENOSCOPY Left 04/16/2018   Procedure: ESOPHAGOGASTRODUODENOSCOPY (EGD);  Surgeon: Juanita Craver, MD;  Location: [facility];  Service: Endoscopy;  Laterality: Left;   ESOPHAGOGASTRODUODENOSCOPY (EGD) WITH PROPOFOL N/A 05/23/2020   Procedure: ESOPHAGOGASTRODUODENOSCOPY (EGD) WITH PROPOFOL;  Surgeon: Jackquline Denmark, MD;  [facility];  Service: Gastroenterology;  Laterality: N/A;   ESOPHAGOGASTRODUODENOSCOPY (EGD) WITH PROPOFOL N/A 12/09/2020   Procedure: ESOPHAGOGASTRODUODENOSCOPY (EGD) WITH PROPOFOL;  Surgeon: Thornton Park, MD;  Location: [facility];  Service: Gastroenterology;  Laterality: N/A;   EYE SURGERY     FOOT SURGERY     HOT HEMOSTASIS N/A 04/16/2018   Procedure: HOT HEMOSTASIS (ARGON PLASMA COAGULATION/BICAP);  Surgeon: Juanita Craver, MD;  Location: [facility];  Service: Endoscopy;  Laterality: N/A;   HOT HEMOSTASIS N/A 05/23/2020   Procedure: HOT HEMOSTASIS (ARGON PLASMA COAGULATION/BICAP);  Surgeon: Jackquline Denmark, MD;  Location: [facility];  Service: Gastroenterology;  Laterality: N/A;  HOT HEMOSTASIS N/A 12/09/2020   Procedure: HOT HEMOSTASIS (ARGON PLASMA COAGULATION/BICAP);  Surgeon: Thornton Park, MD;  Location: [facility];  Service: Gastroenterology;  Laterality: N/A;   LYMPHADENECTOMY Bilateral 02/27/2014   Procedure: BILATERAL LYMPHADENECTOMY;  Surgeon: Alexis Frock, MD;  [facility];  Service: Urology;  Laterality: Bilateral;   POLYPECTOMY  05/23/2020   Procedure: POLYPECTOMY;  Surgeon: Jackquline Denmark, MD;  [facility];  Service: Gastroenterology;;    PROSTATE BIOPSY  12/2013   Gleason 4+3=7, volume 31.31 cc   ROBOT ASSISTED LAPAROSCOPIC RADICAL PROSTATECTOMY N/A 02/27/2014   Procedure: ROBOTIC ASSISTED LAPAROSCOPIC RADICAL PROSTATECTOMY WITH INDOCYANINE GREEN DYE;  Surgeon: Alexis Frock, MD;  [facility];  Service: Urology;  Laterality: N/A;    There were no vitals filed for this visit.   Subjective Assessment - 01/12/21 1019     Subjective  No pain whatsoever    Pertinent History CVA x 2 (Jan and Feb 2022), recent syncopal seizure 10/05/20, DM w/ diabetic retinopathy, HTN, HLD, GERD    Limitations NO Driving, **recent seizure    Currently in Pain? No/denies             In prep for financial management/money exchange: pt attempting 3 digit addition and subtraction but required max cues/assist for borrowing and carrying over. Pt able to do single digit add and sub with 95% accuracy (2 errors entire page) however required extra time. Attempted 2 digit addition with no carry over but pt still required assist - however, when asked, he knew each column separately  Pt placing grooved pegs in pegboard Rt hand w/ mod cues to not use Lt hand to assist in manipulating peg for placement. Pt removing pegs RT hand                        OT Short Term Goals - 01/07/21 0917       OT SHORT TERM GOAL #1   Title Independent with initial HEP for bilateral coordination and Rt grip strength    Time 5    Period Weeks    Status On-going      OT SHORT TERM GOAL #2   Title Pt/family to verbalize acquisition of tub bench to prevent falls    Time 5    Period Weeks    Status Achieved      OT SHORT TERM GOAL #3   Title Pt to consistently perform snack prep and heat up microwaveable items    Time 5    Period Weeks    Status On-going   cues for safety/fall prevention     OT SHORT TERM GOAL #4   Title Pt to wash dishes and fold towels consistently    Time 5    Period Weeks    Status On-going   washed 2 dishes in clinic  on 01/07/21 w/ sup     OT SHORT TERM GOAL #5   Title Pt to improve coordination bilaterally by 10 sec    Baseline Rt = 1 min, Lt = 47 sec    Time 5    Period Weeks    Status New               OT Long Term Goals - 11/25/20 8003       OT LONG TERM GOAL #1   Title Independent with UE HEP for strengthening    Time 10    Period Weeks    Status New  OT LONG TERM GOAL #2   Title Pt to improve Rt grip strength by 5 lbs or greater    Baseline 51 lbs (Lt = 60 lbs)    Time 10    Period Weeks    Status New      OT LONG TERM GOAL #3   Title Pt to perform simple cooking task (egg) with distant supervision    Time 10    Period Weeks    Status New      OT LONG TERM GOAL #4   Title Pt to consistently perform light IADLS (all cleaning) at mod I level    Time 10    Period Weeks    Status New      OT LONG TERM GOAL #5   Title Pt to perform simple financial management with direct supervision    Time 10    Period Weeks    Status New                   Plan - 01/12/21 1046     Clinical Impression Statement Pt able to do simple single digit addition and subtraction fairly consistently w/ only 2 errors entire page. Pt w/ difficulty with double digit addition    OT Occupational Profile and History Detailed Assessment- Review of Records and additional review of physical, cognitive, psychosocial history related to current functional performance    Occupational performance deficits (Please refer to evaluation for details): IADL's;ADL's;Social Participation    Body Structure / Function / Physical Skills Strength;Dexterity;UE functional use;IADL;Endurance;Mobility;Coordination;FMC;Vision    Cognitive Skills Problem Solve;Sequencing;Perception;Learn    Rehab Potential Good    Clinical Decision Making Several treatment options, min-mod task modification necessary    Comorbidities Affecting Occupational Performance: Presence of comorbidities impacting occupational performance     Comorbidities impacting occupational performance description: seizure, diabetic retinopathy    Modification or Assistance to Complete Evaluation  No modification of tasks or assist necessary to complete eval    OT Frequency 2x / week    OT Duration --   10 weeks (anticipate only 6-8 weeks)   OT Treatment/Interventions Self-care/ADL training;DME and/or AE instruction;Therapeutic activities;Therapeutic exercise;Cognitive remediation/compensation;Coping strategies training;Visual/perceptual remediation/compensation;Functional Mobility Training;Neuromuscular education;Manual Therapy;Patient/family education    Plan continue coordination Rt hand, light IADLS (monitor BP)    Consulted and Agree with Plan of Care Patient;Family member/caregiver    Family Member Consulted niece [name])             Patient will benefit from skilled therapeutic intervention in order to improve the following deficits and impairments:   Body Structure / Function / Physical Skills: Strength, Dexterity, UE functional use, IADL, Endurance, Mobility, Coordination, FMC, Vision Cognitive Skills: Problem Solve, Sequencing, Perception, Learn     Visit Diagnosis: Other symptoms and signs involving cognitive functions following cerebral infarction  Other lack of coordination    Problem List Patient Active Problem List   Diagnosis Date Noted   Hospital discharge follow-up 12/24/2020   Weakness of both legs    Adenomatous duodenal polyp    AVM (arteriovenous malformation) of duodenum, acquired with hemorrhage    Need for hepatitis C screening test 11/10/2020   Pneumococcal vaccination administered at current visit 11/10/2020   Healthcare maintenance 11/06/2020   Hypoglycemia 95/12/3265   Acute metabolic encephalopathy 12/45/8099   Acute on chronic anemia 10/22/2020   Angiodysplasia of intestine    GI bleed 10/05/2020   Melena    Upper GI bleed    Iron deficiency anemia 05/22/2020  Constipation 05/22/2020    GIB (gastrointestinal bleeding) 05/21/2020   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 ([place])    History of CVA (cerebrovascular accident) 04/23/2020   Hyperlipidemia associated with type 2 diabetes mellitus ([place]) 04/23/2020   Alcohol use 04/23/2020   AKI (acute kidney injury) ([place]) 04/23/2020   Tobacco use 04/23/2020   Unresponsive 11/08/2018   Syncopal seizure ([place]) 11/08/2018   Hypertension associated with diabetes ([place]) 11/08/2018   Alcohol withdrawal seizure with complication, with unspecified complication ([place]) 88/28/0034   Type 2 diabetes mellitus without complication ([place]) 91/79/1505   Symptomatic anemia 04/15/2018   Prostate cancer ([place]) 02/27/2014   Malignant neoplasm of prostate ([place]) 01/09/2014   Bunion 01/17/2013   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    Carey Bullocks, OTR/L 01/12/2021, 10:54 AM  [facility] [address] [place], [place], [address] Phone: [phone]   Fax:  [phone]  Name: Gregory Nunez. MRN: [id number] Date of Birth: [birth year]

## 2021-01-12 NOTE — Therapy (Signed)
[facility] [address] [place], [place], [address] Phone: [phone]   Fax:  [phone]  Speech Language Pathology Treatment  Patient Details  Name: Gregory Nunez. MRN: [id number] Date of Birth: [birth year] Referring Provider (SLP): Dr. Velna Ochs   Encounter Date: 01/12/2021   End of Session - 01/12/21 1244     Visit Number 9    Number of Visits 25    Date for SLP Re-Evaluation 02/09/21    SLP Start Time 0932    SLP Stop Time  3967    SLP Time Calculation (min) 42 min             Past Medical History:  Diagnosis Date   CAO (chronic airflow obstruction) (HCC)    Cataract    OD   CVA (cerebral vascular accident) ([place])    Depression    Diabetes mellitus without complication ([place])    Diabetic retinopathy ([place])    NPDR OU   GERD (gastroesophageal reflux disease)    if drinks alcohol   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   Patient is approximately 5-week status post bunion correction left foot   Hyperlipidemia    Hypertension    Hypertensive retinopathy    OU   Malignant neoplasm of prostate ([place]) 01/09/2014   Neuropathy    Pancreatitis    Prostate cancer ([place]) 12/19/2013   Gleason 4+3=7, volume 31.31 cc   Sciatica    Shortness of breath dyspnea    with exertion     Past Surgical History:  Procedure Laterality Date   BIOPSY  04/16/2018   Procedure: BIOPSY;  Surgeon: Juanita Craver, MD;  [facility];  Service: Endoscopy;;   BIOPSY  05/23/2020   Procedure: BIOPSY;  Surgeon: Jackquline Denmark, MD;  [facility];  Service: Gastroenterology;;  EGD and COLON   BIOPSY  12/09/2020   Procedure: BIOPSY;  Surgeon: Thornton Park, MD;  Location: [facility] ENDOSCOPY;  Service: Gastroenterology;;   biopsy on throat     hx of    CATARACT EXTRACTION Bilateral    Dr. Quentin Ore   COLONOSCOPY N/A 05/23/2020   Procedure: COLONOSCOPY;  Surgeon: Jackquline Denmark, MD;  [facility];  Service: Gastroenterology;  Laterality: N/A;   ENTEROSCOPY N/A 10/07/2020   Procedure: ENTEROSCOPY;  Surgeon: [name], MD;  Location: [facility] ENDOSCOPY;  Service: Endoscopy;  Laterality: N/A;   ESOPHAGOGASTRODUODENOSCOPY Left 04/16/2018   Procedure: ESOPHAGOGASTRODUODENOSCOPY (EGD);  Surgeon: Juanita Craver, MD;  Location: [facility];  Service: Endoscopy;  Laterality: Left;   ESOPHAGOGASTRODUODENOSCOPY (EGD) WITH PROPOFOL N/A 05/23/2020   Procedure: ESOPHAGOGASTRODUODENOSCOPY (EGD) WITH PROPOFOL;  Surgeon: Jackquline Denmark, MD;  [facility];  Service: Gastroenterology;  Laterality: N/A;   ESOPHAGOGASTRODUODENOSCOPY (EGD) WITH PROPOFOL N/A 12/09/2020   Procedure: ESOPHAGOGASTRODUODENOSCOPY (EGD) WITH PROPOFOL;  Surgeon: Thornton Park, MD;  Location: [facility];  Service: Gastroenterology;  Laterality: N/A;   EYE SURGERY     FOOT SURGERY     HOT HEMOSTASIS N/A 04/16/2018   Procedure: HOT HEMOSTASIS (ARGON PLASMA COAGULATION/BICAP);  Surgeon: Juanita Craver, MD;  Location: [facility];  Service: Endoscopy;  Laterality: N/A;   HOT HEMOSTASIS N/A 05/23/2020   Procedure: HOT HEMOSTASIS (ARGON PLASMA COAGULATION/BICAP);  Surgeon: Jackquline Denmark, MD;  Location: [facility];  Service: Gastroenterology;  Laterality: N/A;   HOT HEMOSTASIS N/A 12/09/2020   Procedure: HOT HEMOSTASIS (ARGON PLASMA COAGULATION/BICAP);  Surgeon: Thornton Park, MD;  Location: [facility];  Service: Gastroenterology;  Laterality: N/A;   LYMPHADENECTOMY Bilateral 02/27/2014  Procedure: BILATERAL LYMPHADENECTOMY;  Surgeon: Sebastian Ache, MD;  [facility];  Service: Urology;  Laterality: Bilateral;   POLYPECTOMY  05/23/2020   Procedure: POLYPECTOMY;  Surgeon: Lynann Bologna, MD;  [facility];  Service: Gastroenterology;;   PROSTATE BIOPSY  12/2013   Gleason 4+3=7, volume 31.31 cc   ROBOT ASSISTED LAPAROSCOPIC RADICAL PROSTATECTOMY N/A 02/27/2014   Procedure: ROBOTIC ASSISTED LAPAROSCOPIC  RADICAL PROSTATECTOMY WITH INDOCYANINE GREEN DYE;  Surgeon: Sebastian Ache, MD;  [facility];  Service: Urology;  Laterality: N/A;    There were no vitals filed for this visit.   Subjective Assessment - 01/12/21 0937     Subjective "Here?" asking where he should sit,    Currently in Pain? No/denies                   ADULT SLP TREATMENT - 01/12/21 0938       General Information   Behavior/Cognition Alert;Cooperative;Pleasant mood;Requires cueing      Treatment Provided   Treatment provided Cognitive-Linquistic      Cognitive-Linquistic Treatment   Treatment focused on Cognition;Aphasia;Patient/family/caregiver education    Skilled Treatment Gregory Nunez continues to report he is completing HEP for PT/OT daily, but his niece Gregory Nunez has been helping him but this is hard as she works full time. Suggested that [name] do some of the exercises without her and he agrees to try.Gregory Nunez states his family has noted his improved speech/language.He is recalling and taking am and pm meds independently. Gregory Nunez is still soritng his meds weeksly. Instructed Gregory Nunez to sort his own meds next week with Gregory Nunez's supervision.  He went to his own house Saturday and stayed alone. He forgot is binder and HW in Gregory Nunez;s car, but with questing cues, he recalled the assignment. He is doing dishes at home, making his bed, and has changed and washed sheets. He his doing his own laundry. we generated 4 other tasks he would need to do at home and 2 safety concerns with each, including using broom/vacuum; getting refills on meds including getting a ride to the pharmacy; and get food. Each task required frequent questioning cues to problem solve and generate 2 safety concerns.  He wrote a list 10 of groceries he would need at home to return independently. Improvment noted with reduced spelling errors 4/10 words, however max A to ID error. Copying cues required for error correction. This has also improved speed and accuracy.  He  generated strategy of micorwaving breakfast and using microwave meals with supervision cues after, with frequent questioning cues to ID'd safety concerns with cooking.      Assessment / Recommendations / Plan   Plan Continue with current plan of care      Progression Toward Goals   Progression toward goals Progressing toward goals              SLP Education - 01/12/21 1017     Education Details HW provided - monthly budget    Person(s) Educated Patient    Methods Explanation;Handout    Comprehension Verbalized understanding;Need further instruction;Verbal cues required              SLP Short Term Goals - 01/12/21 1243       SLP SHORT TERM GOAL #1   Title Pt will use external aids to recall am meds with 3 or less reminders from family over 1 week    Baseline family remidning his consistently every day    Time 1    Period Weeks  Status Not Met      SLP SHORT TERM GOAL #2   Title Gregory Nunez will carryover 2 compensations for memory and attention to complete 3 house hold chores a week with occasiona min A from family    Baseline not completing house hold chores    Time 1    Period Weeks    Status Not Met      SLP SHORT TERM GOAL #3   Title 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 18/20 sentences with occasional min A    Time 1    Period Weeks    Status Not Met      SLP SHORT TERM GOAL #4   Title Pt will name 10 items for personally relevant category using verbal compensations as needed with occasional min A over 2 sessions    Time 1    Period Weeks    Status Not Met      SLP SHORT TERM GOAL #5   Title Pt will fill out 2 money orders to pay bills with occasional min A from family    Baseline Nephew is paying his bills for him    Time 1    Period Weeks    Status Not Met              SLP Long Term Goals - 01/12/21 1244       SLP LONG TERM GOAL #1   Title Pt will use external aids and compensations to manage his medications,  appointments and daily to do list with occasional min A from family    Baseline Family managing meds and appointments    Time 5    Period Weeks    Status On-going      SLP LONG TERM GOAL #2   Title 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 over 15 minute conversation to be 95% intelligible with occasional min A    Time 5    Period Weeks    Status On-going      SLP LONG TERM GOAL #3   Title Pt will ID aphasic errors in simple conversation with 3 or less verbal cues and attempt to self correct errors    Baseline Pt unaware of errors    Time 5    Period Weeks    Status On-going      SLP LONG TERM GOAL #4   Title Pt will use organization system, calendar to pay 3 bills on time with occasional min A from family.    Baseline family managing his finances    Time 5    Period Weeks    Status On-going      SLP LONG TERM GOAL #5   Title Family will rate Gregory Nunez' communciation on The Communicative Participation Item [employer] General Short Form a 12 or higher    Baseline rated a score of 9    Time 5    Period Weeks    Status On-going      SLP LONG TERM GOAL #6   Title Pt will carryover 3 strategies for attention and memory to cook3  simple meals with supervision from family    Baseline not cooking    Time 5    Period Weeks    Status On-going              Plan - 01/12/21 1018     Clinical Impression Statement Mild to moderate aphasia improving. Moderate cognitive impairment persists affecting safety, decision making, sequencing. Targeted  4 tasks he would need to do to live indepenently, ID'ing safety concers and solutions with frequent mod A. Word finding , spelling, error awareness targeted generating list of foods he would need to upon return home with consistent max A for error awareness. Gregory Nunez continues to required supervision from family for IADL's, but has improved partcipation in house hold chores of dishes, laundry, changing his sheets. [place]kn[place] skilled ST to maximize  communication and cognition for possible return home independently which is pt's goal.    Speech Therapy Frequency 2x / week    Duration 12 weeks    Treatment/Interventions Environmental controls;Cueing hierarchy;Compensatory strategies;Functional tasks;Cognitive reorganization;Compensatory techniques;Patient/family education;Multimodal communcation approach;Internal/external aids    Potential to Achieve Goals Fair    Potential Considerations Co-morbidities;Previous level of function;Cooperation/participation level             Patient will benefit from skilled therapeutic intervention in order to improve the following deficits and impairments:   Cognitive communication deficit    Problem List Patient Active Problem List   Diagnosis Date Noted   Hospital discharge follow-up 12/24/2020   Weakness of both legs    Adenomatous duodenal polyp    AVM (arteriovenous malformation) of duodenum, acquired with hemorrhage    Need for hepatitis C screening test 11/10/2020   Pneumococcal vaccination administered at current visit 11/10/2020   Healthcare maintenance 11/06/2020   Hypoglycemia 83/29/1916   Acute metabolic encephalopathy 60/60/0459   Acute on chronic anemia 10/22/2020   Angiodysplasia of intestine    GI bleed 10/05/2020   Melena    Upper GI bleed    Iron deficiency anemia 05/22/2020   Constipation 05/22/2020   GIB (gastrointestinal bleeding) 05/21/2020   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 ([place])    History of CVA (cerebrovascular accident) 04/23/2020   Hyperlipidemia associated with type 2 diabetes mellitus ([place]) 04/23/2020   Alcohol use 04/23/2020   AKI (acute kidney injury) ([place]) 04/23/2020   Tobacco use 04/23/2020   Unresponsive 11/08/2018   Syncopal seizure ([place]) 11/08/2018   Hypertension associated with diabetes ([place]) 11/08/2018   Alcohol withdrawal seizure with complication, with unspecified complication ([place]) 97/74/1423   Type 2 diabetes  mellitus without complication ([place]) 95/32/0233   Symptomatic anemia 04/15/2018   Prostate cancer ([place]) 02/27/2014   Malignant neoplasm of prostate ([place]) 01/09/2014   Bunion 01/17/2013   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    Gregory Nunez, Gregory Rusk MS, CCC-SLP 01/12/2021, 12:45 PM  [facility] [address] [place], [place], [address] Phone: [phone]   Fax:  [phone]   Name: Asad Keeven. MRN: [id number] Date of Birth: 1945/05/23

## 2021-01-12 NOTE — Therapy (Addendum)
[facility] [address] [place], [place], [address] Phone: [phone]   Fax:  [phone]  Physical Therapy Treatment  Patient Details  Name: Anderson Middlebrooks. MRN: [id number] Date of Birth: 29-Sep-1945 Referring Provider (PT): Velna Ochs, MD   Encounter Date: 01/12/2021   PT End of Session - 01/12/21 0908     Visit Number 7    Number of Visits 13    Date for PT Re-Evaluation 01/24/21    Authorization Type UHC Medicare    PT Start Time 0900   pt late to session   PT Stop Time 0931    PT Time Calculation (min) 31 min    Equipment Utilized During Treatment Gait belt    Activity Tolerance Patient tolerated treatment well    Behavior During Therapy WFL for tasks assessed/performed             Past Medical History:  Diagnosis Date   CAO (chronic airflow obstruction) (HCC)    Cataract    OD   CVA (cerebral vascular accident) ([place])    Depression    Diabetes mellitus without complication ([place])    Diabetic retinopathy ([place])    NPDR OU   GERD (gastroesophageal reflux disease)    if drinks alcohol   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   Patient is approximately 5-week status post bunion correction left foot   Hyperlipidemia    Hypertension    Hypertensive retinopathy    OU   Malignant neoplasm of prostate ([place]) 01/09/2014   Neuropathy    Pancreatitis    Prostate cancer ([place]) 12/19/2013   Gleason 4+3=7, volume 31.31 cc   Sciatica    Shortness of breath dyspnea    with exertion     Past Surgical History:  Procedure Laterality Date   BIOPSY  04/16/2018   Procedure: BIOPSY;  Surgeon: Juanita Craver, MD;  [facility];  Service: Endoscopy;;   BIOPSY  05/23/2020   Procedure: BIOPSY;  Surgeon: Jackquline Denmark, MD;  [facility];  Service: Gastroenterology;;  EGD and COLON   BIOPSY  12/09/2020   Procedure: BIOPSY;  Surgeon: Thornton Park, MD;  Location: [facility];   Service: Gastroenterology;;   biopsy on throat     hx of    CATARACT EXTRACTION Bilateral    Dr. Quentin Ore   COLONOSCOPY N/A 05/23/2020   Procedure: COLONOSCOPY;  Surgeon: Jackquline Denmark, MD;  [facility];  Service: Gastroenterology;  Laterality: N/A;   ENTEROSCOPY N/A 10/07/2020   Procedure: ENTEROSCOPY;  Surgeon: [name], MD;  Location: [facility]COPY;  Service: Endoscopy;  Laterality: N/A;   ESOPHAGOGASTRODUODENOSCOPY Left 04/16/2018   Procedure: ESOPHAGOGASTRODUODENOSCOPY (EGD);  Surgeon: Juanita Craver, MD;  Location: [facility];  Service: Endoscopy;  Laterality: Left;   ESOPHAGOGASTRODUODENOSCOPY (EGD) WITH PROPOFOL N/A 05/23/2020   Procedure: ESOPHAGOGASTRODUODENOSCOPY (EGD) WITH PROPOFOL;  Surgeon: Jackquline Denmark, MD;  [facility];  Service: Gastroenterology;  Laterality: N/A;   ESOPHAGOGASTRODUODENOSCOPY (EGD) WITH PROPOFOL N/A 12/09/2020   Procedure: ESOPHAGOGASTRODUODENOSCOPY (EGD) WITH PROPOFOL;  Surgeon: Thornton Park, MD;  Location: [facility];  Service: Gastroenterology;  Laterality: N/A;   EYE SURGERY     FOOT SURGERY     HOT HEMOSTASIS N/A 04/16/2018   Procedure: HOT HEMOSTASIS (ARGON PLASMA COAGULATION/BICAP);  Surgeon: Juanita Craver, MD;  Location: [facility];  Service: Endoscopy;  Laterality: N/A;   HOT HEMOSTASIS N/A 05/23/2020   Procedure: HOT HEMOSTASIS (ARGON PLASMA COAGULATION/BICAP);  Surgeon: Jackquline Denmark, MD;  [facility];  Service: Gastroenterology;  Laterality: N/A;   HOT HEMOSTASIS N/A 12/09/2020   Procedure: HOT HEMOSTASIS (ARGON PLASMA COAGULATION/BICAP);  Surgeon: Thornton Park, MD;  Location: [facility];  Service: Gastroenterology;  Laterality: N/A;   LYMPHADENECTOMY Bilateral 02/27/2014   Procedure: BILATERAL LYMPHADENECTOMY;  Surgeon: Alexis Frock, MD;  [facility];  Service: Urology;  Laterality: Bilateral;   POLYPECTOMY  05/23/2020   Procedure: POLYPECTOMY;  Surgeon: Jackquline Denmark, MD;  [facility];  Service: Gastroenterology;;   PROSTATE BIOPSY  12/2013   Gleason 4+3=7, volume 31.31 cc   ROBOT ASSISTED LAPAROSCOPIC RADICAL PROSTATECTOMY N/A 02/27/2014   Procedure: ROBOTIC ASSISTED LAPAROSCOPIC RADICAL PROSTATECTOMY WITH INDOCYANINE GREEN DYE;  Surgeon: Alexis Frock, MD;  [facility];  Service: Urology;  Laterality: N/A;    Vitals:   01/12/21 0904 01/12/21 0918  BP: 130/60 124/60  Pulse: (!) 102 (!) 104     Subjective Assessment - 01/12/21 0901     Subjective No changes, no falls.    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 No/denies                               [facility] Adult PT Treatment/Exercise - 01/12/21 0905       Ambulation/Gait   Ambulation/Gait Yes    Ambulation/Gait Assistance 4: Min guard    Ambulation/Gait Assistance Details Cues for sequencing SPC and placing it out to the side. Pt has tendency to keep cane more posteriorly when stepping with LLE. Cues at times for slowed pace    Ambulation Distance (Feet) 300 Feet    Assistive device Straight cane    Gait Pattern Step-through pattern;Decreased arm swing - left;Narrow base of support    Ambulation Surface Level;Indoor      Knee/Hip Exercises: Aerobic   Other Aerobic SciFit x 5 min level 4 with BUE and BLE for strengthening and warm up to try to get BP up a bit with standing activities, cues for full ROM                 Balance Exercises - 01/12/21 0911       Balance Exercises: Standing   Standing, One Foot on a Step Eyes open;6 inch;Limitations    Standing, One Foot on a Step Limitations with RLE as stance leg and LLE on step, cues for quad/glute activation holding for 15 seconds tying to perform without UE  support. 2 sets both sides. performed stance on RLE and attemping 3 reps of head turns, pt reporting feeling lightheaded and needing to sit down    Stepping Strategy Lateral;10 reps;Limitations    Stepping Strategy Limitations over foam beam (4") - x12 reps bilat, performing without UE support, cues for posture                  PT Short Term Goals - 12/25/20 0856       PT SHORT TERM GOAL #1   Title Pt will be independent with initial HEP in order to build up functional gains made in therapy. ALL STGS DUE 12/23/20    Baseline 12/25/20 PT had not started HEP due to health issues prior to hospital stay    Time 4  Period Weeks    Status Not Met    Target Date 12/23/20      PT SHORT TERM GOAL #2   Title Pt will decr 5x sit <> stand time to 18 seconds or less in order to demo improved functional BLE strength.    Baseline 20.88 seconds without UE support. 12/25/20 5 x sit to stand=16.17 sec without hands    Time 4    Period Weeks    Status Achieved      PT SHORT TERM GOAL #3   Title Pt will improve gait speed with no AD to at least 3.3 ft/sec in order to demo improved gait efficiency.    Baseline 2.93 ft/sec. 12/25/20 2.63f/sec    Time 4    Period Weeks    Status Not Met      PT SHORT TERM GOAL #4   Title Pt and pt's family will verbalize understanding of fall prevention in the home in order to demo decr fall risk.    Baseline 12/25/20 PT educated on fall prevention strategies around home and pt and his neice verbalized understanding.    Time 4    Period Weeks    Status Achieved      PT SHORT TERM GOAL #5   Title Pt will improve FGA score to at least a 19/30 in order to demo decr fall risk.    Baseline 16/30. 12/25/20 FGA=18/30    Time 4    Period Weeks    Status Partially Met               PT Long Term Goals - 11/25/20 1757       PT LONG TERM GOAL #1   Title Pt will be independent with final HEP in order to build up functional gains made in therapy. ALL LTGS DUE  01/20/21    Time 8    Period Weeks    Status New    Target Date 01/20/21      PT LONG TERM GOAL #2   Title Pt will decr 5x sit <> stand time to 16 seconds or less in order to demo improved functional BLE strength.    Baseline 20.88 seconds without UE support    Time 8    Period Weeks    Status New      PT LONG TERM GOAL #3   Title Pt will ambulate at least 300' outdoors over unlevel surfaces with supervision in order to demo improved community mobility.    Time 8    Period Weeks    Status New      PT LONG TERM GOAL #4   Title Pt will improve FGA score to at least a 22/30 in order to demo decr fall risk.    Baseline 16/30    Time 8    Period Weeks    Status New      PT LONG TERM GOAL #5   Title Pt will improve condition 4 of mCTSIB to at least 20 seconds in order to demo improved vestibular input for balance.    Baseline 7 seconds    Time 8    Period Weeks    Status New                   Plan - 01/12/21 1203     Clinical Impression Statement Session limited today due to pt arriving late. Pt reporting lightheadedness after performing balance with head turns and needing to sit and rest. BP  assessed and was stable (approx. the same as when start of session). Continued to focus on gait training with cues for proper cane placement. Will continue to progress towards LTGs.    Personal Factors and Comorbidities Comorbidity 3+;Past/Current Experience;Time since onset of injury/illness/exacerbation    Comorbidities CVA, alcohol use, diabetes, hyperlipidemia, hypertension, iron deficiency anemia, depression, GERD, prostate cancer    Examination-Activity Limitations Stairs;Transfers;Locomotion Level    Examination-Participation Restrictions Community Activity;Driving    Stability/Clinical Decision Making Stable/Uncomplicated    Rehab Potential Good    PT Frequency 2x / week   12 visits over 8 weeks   PT Duration 8 weeks   12 visits over 8 weeks   PT Treatment/Interventions  ADLs/Self Care Home Management;DME Instruction;Gait training;Stair training;Therapeutic activities;Functional mobility training;Therapeutic exercise;Balance training;Patient/family education;Neuromuscular re-education;Vestibular    PT Next Visit Plan Check BP. Continue with balance training and gait on varied surfaces. BLE strengthening, SLS tasks. try Scifit/Nustep for strength/activity tolerance    PT Home Exercise Plan Access Code [email or web address]    Consulted and Agree with Plan of Care Patient;Family member/caregiver    Family Member Consulted Sister             Patient will benefit from skilled therapeutic intervention in order to improve the following deficits and impairments:  Abnormal gait, Decreased balance, Decreased strength, Impaired sensation, Postural dysfunction, Difficulty walking, Decreased activity tolerance  Visit Diagnosis: Unsteadiness on feet  Other abnormalities of gait and mobility  Muscle weakness (generalized)     Problem List Patient Active Problem List   Diagnosis Date Noted   Hospital discharge follow-up 12/24/2020   Weakness of both legs    Adenomatous duodenal polyp    AVM (arteriovenous malformation) of duodenum, acquired with hemorrhage    Need for hepatitis C screening test 11/10/2020   Pneumococcal vaccination administered at current visit 11/10/2020   Healthcare maintenance 11/06/2020   Hypoglycemia 33/29/5188   Acute metabolic encephalopathy 41/66/0630   Acute on chronic anemia 10/22/2020   Angiodysplasia of intestine    GI bleed 10/05/2020   Melena    Upper GI bleed    Iron deficiency anemia 05/22/2020   Constipation 05/22/2020   GIB (gastrointestinal bleeding) 05/21/2020   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 ([place])    History of CVA (cerebrovascular accident) 04/23/2020   Hyperlipidemia associated with type 2 diabetes mellitus ([place]) 04/23/2020   Alcohol use 04/23/2020   AKI (acute kidney injury) ([place]) 04/23/2020    Tobacco use 04/23/2020   Unresponsive 11/08/2018   Syncopal seizure ([place]) 11/08/2018   Hypertension associated with diabetes ([place]) 11/08/2018   Alcohol withdrawal seizure with complication, with unspecified complication ([place]) 16/04/930   Type 2 diabetes mellitus without complication ([place]) 35/57/3220   Symptomatic anemia 04/15/2018   Prostate cancer ([place]) 02/27/2014   Malignant neoplasm of prostate ([place]) 01/09/2014   Bunion 01/17/2013   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    Arliss Journey, PT, DPT 01/12/2021, 12:04 PM  [facility] [address] [place], [place], [address] Phone: [phone]   Fax:  [phone]  Name: Sani Madariaga. MRN: [id number] Date of Birth: 1945-12-29

## 2021-01-14 ENCOUNTER — Ambulatory Visit: Payer: Medicare Other | Admitting: Speech Pathology

## 2021-01-14 ENCOUNTER — Ambulatory Visit: Payer: Medicare Other

## 2021-01-14 ENCOUNTER — Other Ambulatory Visit: Payer: Self-pay

## 2021-01-14 ENCOUNTER — Encounter: Payer: Self-pay | Admitting: Speech Pathology

## 2021-01-14 ENCOUNTER — Ambulatory Visit: Payer: Medicare Other | Admitting: Occupational Therapy

## 2021-01-14 DIAGNOSIS — M6281 Muscle weakness (generalized): Secondary | ICD-10-CM

## 2021-01-14 DIAGNOSIS — R2689 Other abnormalities of gait and mobility: Secondary | ICD-10-CM

## 2021-01-14 DIAGNOSIS — R2681 Unsteadiness on feet: Secondary | ICD-10-CM

## 2021-01-14 DIAGNOSIS — R278 Other lack of coordination: Secondary | ICD-10-CM

## 2021-01-14 DIAGNOSIS — R41841 Cognitive communication deficit: Secondary | ICD-10-CM

## 2021-01-14 DIAGNOSIS — I69318 Other symptoms and signs involving cognitive functions following cerebral infarction: Secondary | ICD-10-CM

## 2021-01-14 DIAGNOSIS — R4701 Aphasia: Secondary | ICD-10-CM

## 2021-01-14 NOTE — Patient Instructions (Signed)
   [facility], you need to be responsible for brining your binder and HW to each session  Get a shoulder bag if it helps you carry it  Put up a sign at the door to remind  you  if needed  Bring back [place] each session

## 2021-01-14 NOTE — Therapy (Signed)
[facility] [address] [place], [place], [address] Phone: [phone]   Fax:  [phone]  Occupational Therapy Treatment  Patient Details  Name: Gregory Nunez. MRN: [id number] Date of Birth: January 28, 1946 Referring Provider (OT): Dr. Farrel Gordon   Encounter Date: 01/14/2021   OT End of Session - 01/14/21 0907     Visit Number 6    Number of Visits 21    Authorization Type UHC MCR/MCD    Progress Note Due on Visit 10    OT Start Time 0845    OT Stop Time 0930    OT Time Calculation (min) 45 min    Activity Tolerance Patient tolerated treatment well    Behavior During Therapy WFL for tasks assessed/performed             Past Medical History:  Diagnosis Date   CAO (chronic airflow obstruction) (HCC)    Cataract    OD   CVA (cerebral vascular accident) ([place])    Depression    Diabetes mellitus without complication ([place])    Diabetic retinopathy ([place])    NPDR OU   GERD (gastroesophageal reflux disease)    if drinks alcohol   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   Patient is approximately 5-week status post bunion correction left foot   Hyperlipidemia    Hypertension    Hypertensive retinopathy    OU   Malignant neoplasm of prostate ([place]) 01/09/2014   Neuropathy    Pancreatitis    Prostate cancer ([place]) 12/19/2013   Gleason 4+3=7, volume 31.31 cc   Sciatica    Shortness of breath dyspnea    with exertion     Past Surgical History:  Procedure Laterality Date   BIOPSY  04/16/2018   Procedure: BIOPSY;  Surgeon: Juanita Craver, MD;  [facility];  Service: Endoscopy;;   BIOPSY  05/23/2020   Procedure: BIOPSY;  Surgeon: Jackquline Denmark, MD;  [facility];  Service: Gastroenterology;;  EGD and COLON   BIOPSY  12/09/2020   Procedure: BIOPSY;  Surgeon: Thornton Park, MD;  [facility];  Service: Gastroenterology;;   biopsy on throat     hx of    CATARACT EXTRACTION  Bilateral    Dr. Quentin Ore   COLONOSCOPY N/A 05/23/2020   Procedure: COLONOSCOPY;  Surgeon: Jackquline Denmark, MD;  [facility];  Service: Gastroenterology;  Laterality: N/A;   ENTEROSCOPY N/A 10/07/2020   Procedure: ENTEROSCOPY;  Surgeon: [name], MD;  Location: [facility];  Service: Endoscopy;  Laterality: N/A;   ESOPHAGOGASTRODUODENOSCOPY Left 04/16/2018   Procedure: ESOPHAGOGASTRODUODENOSCOPY (EGD);  Surgeon: Juanita Craver, MD;  Location: [facility];  Service: Endoscopy;  Laterality: Left;   ESOPHAGOGASTRODUODENOSCOPY (EGD) WITH PROPOFOL N/A 05/23/2020   Procedure: ESOPHAGOGASTRODUODENOSCOPY (EGD) WITH PROPOFOL;  Surgeon: Jackquline Denmark, MD;  [facility];  Service: Gastroenterology;  Laterality: N/A;   ESOPHAGOGASTRODUODENOSCOPY (EGD) WITH PROPOFOL N/A 12/09/2020   Procedure: ESOPHAGOGASTRODUODENOSCOPY (EGD) WITH PROPOFOL;  Surgeon: Thornton Park, MD;  Location: [facility];  Service: Gastroenterology;  Laterality: N/A;   EYE SURGERY     FOOT SURGERY     HOT HEMOSTASIS N/A 04/16/2018   Procedure: HOT HEMOSTASIS (ARGON PLASMA COAGULATION/BICAP);  Surgeon: Juanita Craver, MD;  Location: [facility];  Service: Endoscopy;  Laterality: N/A;   HOT HEMOSTASIS N/A 05/23/2020   Procedure: HOT HEMOSTASIS (ARGON PLASMA COAGULATION/BICAP);  Surgeon: Jackquline Denmark, MD;  Location: [facility];  Service: Gastroenterology;  Laterality: N/A;   HOT HEMOSTASIS N/A 12/09/2020  Procedure: HOT HEMOSTASIS (ARGON PLASMA COAGULATION/BICAP);  Surgeon: Thornton Park, MD;  Location: [facility];  Service: Gastroenterology;  Laterality: N/A;   LYMPHADENECTOMY Bilateral 02/27/2014   Procedure: BILATERAL LYMPHADENECTOMY;  Surgeon: Alexis Frock, MD;  [facility];  Service: Urology;  Laterality: Bilateral;   POLYPECTOMY  05/23/2020   Procedure: POLYPECTOMY;  Surgeon: Jackquline Denmark, MD;  [facility];  Service: Gastroenterology;;   PROSTATE BIOPSY  12/2013   Gleason 4+3=7,  volume 31.31 cc   ROBOT ASSISTED LAPAROSCOPIC RADICAL PROSTATECTOMY N/A 02/27/2014   Procedure: ROBOTIC ASSISTED LAPAROSCOPIC RADICAL PROSTATECTOMY WITH INDOCYANINE GREEN DYE;  Surgeon: Alexis Frock, MD;  [facility];  Service: Urology;  Laterality: N/A;    There were no vitals filed for this visit.   Subjective Assessment - 01/14/21 0846     Subjective  Doing good    Pertinent History CVA x 2 (Jan and Feb 2022), recent syncopal seizure 10/05/20, DM w/ diabetic retinopathy, HTN, HLD, GERD    Limitations NO Driving, **recent seizure    Currently in Pain? No/denies             Began assessing STG's and progress to date. Pt also met 1 LTG - see below.   Stringing pegs for coordination and bilateral integration while following 4 color pattern with 1 error (pt aware of error but wasn't sure what he did wrong or how to correct it)  Attempted very simple money exchange worksheet but first unable due to aphasia and cognitive impairments. Pt however actually able to improve with simple cueing and repetition at approx 75% accuracy                         OT Short Term Goals - 01/14/21 0907       OT SHORT TERM GOAL #1   Title Independent with initial HEP for bilateral coordination and Rt grip strength    Time 5    Period Weeks    Status Achieved      OT SHORT TERM GOAL #2   Title Pt/family to verbalize acquisition of tub bench to prevent falls    Time 5    Period Weeks    Status Achieved      OT SHORT TERM GOAL #3   Title Pt to consistently perform snack prep and heat up microwaveable items    Time 5    Period Weeks    Status Achieved   cues for safety/fall prevention     OT SHORT TERM GOAL #4   Title Pt to wash dishes and fold towels consistently    Time 5    Period Weeks    Status On-going   washed 2 dishes in clinic on 01/07/21 w/ sup     OT SHORT TERM GOAL #5   Title Pt to improve coordination bilaterally by 10 sec    Baseline Rt = 1 min, Lt =  47 sec    Time 5    Period Weeks    Status Partially Met   met on Rt = 47.34 sec, not met on Lt = 40.66 sec (but improved on Lt)              OT Long Term Goals - 01/14/21 0909       OT LONG TERM GOAL #1   Title Independent with UE HEP for strengthening    Time 10    Period Weeks    Status New  OT LONG TERM GOAL #2   Title Pt to improve Rt grip strength by 5 lbs or greater    Baseline 51 lbs (Lt = 60 lbs)    Time 10    Period Weeks    Status Achieved   Rt = 70.1 lbs, Lt = 71.8 lbs     OT LONG TERM GOAL #3   Title Pt to perform simple cooking task (egg) with distant supervision    Time 10    Period Weeks    Status New      OT LONG TERM GOAL #4   Title Pt to consistently perform light IADLS (all cleaning) at mod I level    Time 10    Period Weeks    Status New      OT LONG TERM GOAL #5   Title Pt to perform simple financial management with direct supervision    Time 10    Period Weeks    Status New                   Plan - 01/14/21 0910     Clinical Impression Statement Pt has met 3.5/5 STG's. Pt with improved bilateral coordination and grip strength. Pt also with increased participation in light IADLS    OT Occupational Profile and History Detailed Assessment- Review of Records and additional review of physical, cognitive, psychosocial history related to current functional performance    Occupational performance deficits (Please refer to evaluation for details): IADL's;ADL's;Social Participation    Body Structure / Function / Physical Skills Strength;Dexterity;UE functional use;IADL;Endurance;Mobility;Coordination;FMC;Vision    Cognitive Skills Problem Solve;Sequencing;Perception;Learn    Rehab Potential Good    Clinical Decision Making Several treatment options, min-mod task modification necessary    Comorbidities Affecting Occupational Performance: Presence of comorbidities impacting occupational performance    Comorbidities impacting  occupational performance description: seizure, diabetic retinopathy    Modification or Assistance to Complete Evaluation  No modification of tasks or assist necessary to complete eval    OT Frequency 2x / week    OT Duration --   10 weeks (anticipate only 6-8 weeks)   OT Treatment/Interventions Self-care/ADL training;DME and/or AE instruction;Therapeutic activities;Therapeutic exercise;Cognitive remediation/compensation;Coping strategies training;Visual/perceptual remediation/compensation;Functional Mobility Training;Neuromuscular education;Manual Therapy;Patient/family education    Plan simple cooking task (egg)    Consulted and Agree with Plan of Care Patient;Family member/caregiver    Family Member Consulted niece [name])             Patient will benefit from skilled therapeutic intervention in order to improve the following deficits and impairments:   Body Structure / Function / Physical Skills: Strength, Dexterity, UE functional use, IADL, Endurance, Mobility, Coordination, FMC, Vision Cognitive Skills: Problem Solve, Sequencing, Perception, Learn     Visit Diagnosis: Other lack of coordination  Muscle weakness (generalized)  Other symptoms and signs involving cognitive functions following cerebral infarction    Problem List Patient Active Problem List   Diagnosis Date Noted   Hospital discharge follow-up 12/24/2020   Weakness of both legs    Adenomatous duodenal polyp    AVM (arteriovenous malformation) of duodenum, acquired with hemorrhage    Need for hepatitis C screening test 11/10/2020   Pneumococcal vaccination administered at current visit 11/10/2020   Healthcare maintenance 11/06/2020   Hypoglycemia 24/26/8341   Acute metabolic encephalopathy 96/22/2979   Acute on chronic anemia 10/22/2020   Angiodysplasia of intestine    GI bleed 10/05/2020   Melena    Upper GI bleed    Iron  deficiency anemia 05/22/2020   Constipation 05/22/2020   GIB (gastrointestinal  bleeding) 05/21/2020   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 ([place])    History of CVA (cerebrovascular accident) 04/23/2020   Hyperlipidemia associated with type 2 diabetes mellitus ([place]) 04/23/2020   Alcohol use 04/23/2020   AKI (acute kidney injury) ([place]) 04/23/2020   Tobacco use 04/23/2020   Unresponsive 11/08/2018   Syncopal seizure ([place]) 11/08/2018   Hypertension associated with diabetes ([place]) 11/08/2018   Alcohol withdrawal seizure with complication, with unspecified complication ([place]) 82/99/3716   Type 2 diabetes mellitus without complication ([place]) 96/78/9381   Symptomatic anemia 04/15/2018   Prostate cancer ([place]) 02/27/2014   Malignant neoplasm of prostate ([place]) 01/09/2014   Bunion 01/17/2013   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    Carey Bullocks, OTR/L 01/14/2021, 9:13 AM  [facility] [address] [place], [place], [address] Phone: [phone]   Fax:  [phone]  Name: Gregory Nunez. MRN: [id number] Date of Birth: 1945-11-28

## 2021-01-14 NOTE — Therapy (Signed)
[facility] [address] [place], [place], [address] Phone: [phone]   Fax:  [phone]  Speech Language Pathology Treatment  Patient Details  Name: Gregory Nunez. MRN: [id number] Date of Birth: 16-May-1945 Referring Provider (SLP): Dr. Velna Ochs   Encounter Date: 01/14/2021   End of Session - 01/14/21 1131     Visit Number 10    Number of Visits 25    Date for SLP Re-Evaluation 02/09/21    SLP Start Time 0930    SLP Stop Time  2947    SLP Time Calculation (min) 45 min    Activity Tolerance Patient tolerated treatment well             Past Medical History:  Diagnosis Date   CAO (chronic airflow obstruction) ([place])    Cataract    OD   CVA (cerebral vascular accident) ([place])    Depression    Diabetes mellitus without complication ([place])    Diabetic retinopathy ([place])    NPDR OU   GERD (gastroesophageal reflux disease)    if drinks alcohol   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   Patient is approximately 5-week status post bunion correction left foot   Hyperlipidemia    Hypertension    Hypertensive retinopathy    OU   Malignant neoplasm of prostate ([place]) 01/09/2014   Neuropathy    Pancreatitis    Prostate cancer ([place]) 12/19/2013   Gleason 4+3=7, volume 31.31 cc   Sciatica    Shortness of breath dyspnea    with exertion     Past Surgical History:  Procedure Laterality Date   BIOPSY  04/16/2018   Procedure: BIOPSY;  Surgeon: Juanita Craver, MD;  Location: [facility];  Service: Endoscopy;;   BIOPSY  05/23/2020   Procedure: BIOPSY;  Surgeon: Jackquline Denmark, MD;  Location: [facility];  Service: Gastroenterology;;  EGD and COLON   BIOPSY  12/09/2020   Procedure: BIOPSY;  Surgeon: Thornton Park, MD;  [facility];  Service: Gastroenterology;;   biopsy on throat     hx of    CATARACT EXTRACTION Bilateral    Dr. Quentin Ore   COLONOSCOPY N/A 05/23/2020   Procedure:  COLONOSCOPY;  Surgeon: Jackquline Denmark, MD;  Location: [facility];  Service: Gastroenterology;  Laterality: N/A;   ENTEROSCOPY N/A 10/07/2020   Procedure: ENTEROSCOPY;  Surgeon: [name], MD;  Location: [facility] ENDOSCOPY;  Service: Endoscopy;  Laterality: N/A;   ESOPHAGOGASTRODUODENOSCOPY Left 04/16/2018   Procedure: ESOPHAGOGASTRODUODENOSCOPY (EGD);  Surgeon: Juanita Craver, MD;  Location: [facility] ENDOSCOPY;  Service: Endoscopy;  Laterality: Left;   ESOPHAGOGASTRODUODENOSCOPY (EGD) WITH PROPOFOL N/A 05/23/2020   Procedure: ESOPHAGOGASTRODUODENOSCOPY (EGD) WITH PROPOFOL;  Surgeon: Jackquline Denmark, MD;  Location: [facility];  Service: Gastroenterology;  Laterality: N/A;   ESOPHAGOGASTRODUODENOSCOPY (EGD) WITH PROPOFOL N/A 12/09/2020   Procedure: ESOPHAGOGASTRODUODENOSCOPY (EGD) WITH PROPOFOL;  Surgeon: Thornton Park, MD;  Location: [facility];  Service: Gastroenterology;  Laterality: N/A;   EYE SURGERY     FOOT SURGERY     HOT HEMOSTASIS N/A 04/16/2018   Procedure: HOT HEMOSTASIS (ARGON PLASMA COAGULATION/BICAP);  Surgeon: Juanita Craver, MD;  Location: [facility]OS[facility];  Service: Endoscopy;  Laterality: N/A;   HOT HEMOSTASIS N/A 05/23/2020   Procedure: HOT HEMOSTASIS (ARGON PLASMA COAGULATION/BICAP);  Surgeon: Jackquline Denmark, MD;  Location: [facility]OS[facility];  Service: Gastroenterology;  Laterality: N/A;   HOT HEMOSTASIS N/A 12/09/2020   Procedure: HOT HEMOSTASIS (ARGON PLASMA COAGULATION/BICAP);  Surgeon: Thornton Park, MD;  Location: [facility];  Service:  Gastroenterology;  Laterality: N/A;   LYMPHADENECTOMY Bilateral 02/27/2014   Procedure: BILATERAL LYMPHADENECTOMY;  Surgeon: Alexis Frock, MD;  Location: [facility];  Service: Urology;  Laterality: Bilateral;   POLYPECTOMY  05/23/2020   Procedure: POLYPECTOMY;  Surgeon: Jackquline Denmark, MD;  Location: [facility];  Service: Gastroenterology;;   PROSTATE BIOPSY  12/2013   Gleason 4+3=7, volume 31.31 cc   ROBOT ASSISTED LAPAROSCOPIC RADICAL PROSTATECTOMY N/A  02/27/2014   Procedure: ROBOTIC ASSISTED LAPAROSCOPIC RADICAL PROSTATECTOMY WITH INDOCYANINE GREEN DYE;  Surgeon: Alexis Frock, MD;  Location: [facility];  Service: Urology;  Laterality: N/A;    There were no vitals filed for this visit.   Subjective Assessment - 01/14/21 0934     Subjective "Much better"    Currently in Pain? No/denies             Speech Therapy Progress Note  Dates of Reporting Period: 11/17/20 to 01/14/21  Objective Reports of Subjective Statement: Pt has initiated some household tasks with compensations for attention, speech with improved fluency, word finding episodes reduced - pt reports his speech is improved  Objective Measurements: See goals  Goal Update: Continue goals  Plan: Continue POc  Reason Skilled Services are Required: Pt and family goal is to return pt home with modified independence (family with help with shopping, getting meds, check in on him) At this time, Gregory Nunez still needs A with IADL's and communication so he is staying at his sister's house. Due to communication and cognitive impairments, it remains unsafe for him to live alone. We have requested family attend some sessions to ensure carryover of compensations and HW at home.        ADULT SLP TREATMENT - 01/14/21 0936       General Information   Behavior/Cognition Alert;Cooperative;Pleasant mood;Requires cueing      Treatment Provided   Treatment provided Cognitive-Linquistic      Cognitive-Linquistic Treatment   Treatment focused on Cognition;Aphasia;Patient/family/caregiver education    Skilled Treatment [facility] has not brought in binder. He explained that his niece has it. Instructed Gregory Nunez to be responsible for brining his binder in the house to do his HEP's and Gregory Nunez and bring to the therapy so HW is returned. Provided a sign to post at door to remind him. Mildy complex naming with category and given letter, Gregory Nunez independenlty named 7/15 with extended time for word finding.  Named remaining 8 with usual mod semantic and 1st letter cue. Frequent max A to ID speilling errors in 9/15, and max A copy cues to spell 6/15 words. Targeted word fining and verbal compensations for aphasia with generating descriptions of simple objects using semantic feature analysis - Gregory Nunez accurately and effectively described 10 objects with occasional min questioning cues for 4/10 descriptions. Encouraged him to use this strategy at home - will continue to train.      Assessment / Recommendations / Plan   Plan Continue with current plan of care      Progression Toward Goals   Progression toward goals Progressing toward goals              SLP Education - 01/14/21 1129     Education Details bring back HW and binder every session    Person(s) Educated Patient    Methods Explanation;Verbal cues;Handout    Comprehension Verbal cues required;Need further instruction              SLP Short Term Goals - 01/14/21 1130       SLP SHORT TERM GOAL #  1   Title Pt will use external aids to recall am meds with 3 or less reminders from family over 1 week    Baseline family remidning his consistently every day    Time 1    Period Weeks    Status Not Met      SLP SHORT TERM GOAL #2   Title Gregory Nunez will carryover 2 compensations for memory and attention to complete 3 house hold chores a week with occasiona min A from family    Baseline not completing house hold chores    Time 1    Period Weeks    Status Not Met      SLP SHORT TERM GOAL #3   Title 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 18/20 sentences with occasional min A    Time 1    Period Weeks    Status Not Met      SLP SHORT TERM GOAL #4   Title Pt will name 10 items for personally relevant category using verbal compensations as needed with occasional min A over 2 sessions    Time 1    Period Weeks    Status Not Met      SLP SHORT TERM GOAL #5   Title Pt will fill out 2 money orders to pay  bills with occasional min A from family    Baseline Nephew is paying his bills for him    Time 1    Period Weeks    Status Not Met              SLP Long Term Goals - 01/14/21 1131       SLP LONG TERM GOAL #1   Title Pt will use external aids and compensations to manage his medications, appointments and daily to do list with occasional min A from family    Baseline Family managing meds and appointments    Time 5    Period Weeks    Status On-going      SLP LONG TERM GOAL #2   Title 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 over 15 minute conversation to be 95% intelligible with occasional min A    Time 5    Period Weeks    Status On-going      SLP LONG TERM GOAL #3   Title Pt will ID aphasic errors in simple conversation with 3 or less verbal cues and attempt to self correct errors    Baseline Pt unaware of errors    Time 5    Period Weeks    Status On-going      SLP LONG TERM GOAL #4   Title Pt will use organization system, calendar to pay 3 bills on time with occasional min A from family.    Baseline family managing his finances    Time 5    Period Weeks    Status On-going      SLP LONG TERM GOAL #5   Title Family will rate Gregory Nunez' communciation on [employer] Communicative Participation Item [employer] General Short Form a 12 or higher    Baseline rated a score of 9    Time 5    Period Weeks    Status On-going      SLP LONG TERM GOAL #6   Title Pt will carryover 3 strategies for attention and memory to cook3  simple meals with supervision from family    Baseline not cooking    Time 5  Period Weeks    Status On-going              Plan - 01/14/21 1130     Clinical Impression Statement Mild to moderate aphasia improving. Moderate cognitive impairment persists affecting safety, decision making, sequencing. Targeted 4 tasks he would need to do to live indepenently, ID'ing safety concers and solutions with frequent mod A. Word finding , spelling, error  awareness targeted generating list of foods he would need to upon return home with consistent max A for error awareness. Gregory Nunez continues to required supervision from family for IADL's, but has improved partcipation in house hold chores of dishes, changing his sheets. [place] skilled ST to maximize communication and cognition for possible return home independently which is pt's goal.    Speech Therapy Frequency 2x / week    Duration 12 weeks    Treatment/Interventions Environmental controls;Cueing hierarchy;Compensatory strategies;Functional tasks;Cognitive reorganization;Compensatory techniques;Patient/family education;Multimodal communcation approach;Internal/external aids    Potential to Achieve Goals Fair    Potential Considerations Co-morbidities;Previous level of function;Cooperation/participation level             Patient will benefit from skilled therapeutic intervention in order to improve the following deficits and impairments:   Cognitive communication deficit  Aphasia    Problem List Patient Active Problem List   Diagnosis Date Noted   Hospital discharge follow-up 12/24/2020   Weakness of both legs    Adenomatous duodenal polyp    AVM (arteriovenous malformation) of duodenum, acquired with hemorrhage    Need for hepatitis C screening test 11/10/2020   Pneumococcal vaccination administered at current visit 11/10/2020   Healthcare maintenance 11/06/2020   Hypoglycemia 80/16/5537   Acute metabolic encephalopathy 48/27/0786   Acute on chronic anemia 10/22/2020   Angiodysplasia of intestine    GI bleed 10/05/2020   Melena    Upper GI bleed    Iron deficiency anemia 05/22/2020   Constipation 05/22/2020   GIB (gastrointestinal bleeding) 05/21/2020   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 ([place])    History of CVA (cerebrovascular accident) 04/23/2020   Hyperlipidemia associated with type 2 diabetes mellitus ([place]) 04/23/2020   Alcohol use  04/23/2020   AKI (acute kidney injury) ([place]) 04/23/2020   Tobacco use 04/23/2020   Unresponsive 11/08/2018   Syncopal seizure ([place]) 11/08/2018   Hypertension associated with diabetes ([place]) 11/08/2018   Alcohol withdrawal seizure with complication, with unspecified complication ([place]) 75/44/9201   Type 2 diabetes mellitus without complication ([place]) 00/71/2197   Symptomatic anemia 04/15/2018   Prostate cancer ([place]) 02/27/2014   Malignant neoplasm of prostate ([place]) 01/09/2014   Bunion 01/17/2013   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    Borden Thune, Annye Rusk MS, CCC-SLP 01/14/2021, 11:32 AM  [facility] [address] [place], [place], [address] Phone: [phone]   Fax:  [phone]   Name: Gregory Nunez. MRN: [id number] Date of Birth: 10-12-1945

## 2021-01-14 NOTE — Therapy (Signed)
[facility] [address] [place], [place], [address] Phone: [phone]   Fax:  [phone]  Physical Therapy Treatment  Patient Details  Name: Gregory Ran. MRN: [id number] Date of Birth: 03/02/1946 Referring Provider (PT): Velna Ochs, MD   Encounter Date: 01/14/2021   PT End of Session - 01/14/21 1019     Visit Number 8    Number of Visits 13    Date for PT Re-Evaluation 01/24/21    Authorization Type [employer] Medicare    PT Start Time 1017    PT Stop Time 1059    PT Time Calculation (min) 42 min    Equipment Utilized During Treatment Gait belt    Activity Tolerance Patient tolerated treatment well    Behavior During Therapy WFL for tasks assessed/performed             Past Medical History:  Diagnosis Date   CAO (chronic airflow obstruction) (H[facility])    Cataract    OD   CVA (cerebral vascular accident) ([place])    Depression    Diabetes mellitus without complication ([facility])    Diabetic retinopathy ([facility])    NPDR OU   GERD (gastroesophageal reflux disease)    if drinks alcohol   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   Patient is approximately 5-week status post bunion correction left foot   Hyperlipidemia    Hypertension    Hypertensive retinopathy    OU   Malignant neoplasm of prostate ([facility]) 01/09/2014   Neuropathy    Pancreatitis    Prostate cancer ([facility]) 12/19/2013   Gleason 4+3=7, volume 31.31 cc   Sciatica    Shortness of breath dyspnea    with exertion     Past Surgical History:  Procedure Laterality Date   BIOPSY  04/16/2018   Procedure: BIOPSY;  Surgeon: Juanita Craver, MD;  Location: [facility];  Service: Endoscopy;;   BIOPSY  05/23/2020   Procedure: BIOPSY;  Surgeon: Jackquline Denmark, MD;  Location: [facility];  Service: Gastroenterology;;  EGD and COLON   BIOPSY  12/09/2020   Procedure: BIOPSY;  Surgeon: Thornton Park, MD;  Location: [facility];  Service:  Gastroenterology;;   biopsy on throat     hx of    CATARACT EXTRACTION Bilateral    Dr. Quentin Ore   COLONOSCOPY N/A 05/23/2020   Procedure: COLONOSCOPY;  Surgeon: Jackquline Denmark, MD;  Location: [facility];  Service: Gastroenterology;  Laterality: N/A;   ENTEROSCOPY N/A 10/07/2020   Procedure: ENTEROSCOPY;  Surgeon: [name], MD;  Location: [facility];  Service: Endoscopy;  Laterality: N/A;   ESOPHAGOGASTRODUODENOSCOPY Left 04/16/2018   Procedure: ESOPHAGOGASTRODUODENOSCOPY (EGD);  Surgeon: Juanita Craver, MD;  Location: [facility];  Service: Endoscopy;  Laterality: Left;   ESOPHAGOGASTRODUODENOSCOPY (EGD) WITH PROPOFOL N/A 05/23/2020   Procedure: ESOPHAGOGASTRODUODENOSCOPY (EGD) WITH PROPOFOL;  Surgeon: Jackquline Denmark, MD;  Location: [facility];  Service: Gastroenterology;  Laterality: N/A;   ESOPHAGOGASTRODUODENOSCOPY (EGD) WITH PROPOFOL N/A 12/09/2020   Procedure: ESOPHAGOGASTRODUODENOSCOPY (EGD) WITH PROPOFOL;  Surgeon: Thornton Park, MD;  Location: [facility];  Service: Gastroenterology;  Laterality: N/A;   EYE SURGERY     FOOT SURGERY     HOT HEMOSTASIS N/A 04/16/2018   Procedure: HOT HEMOSTASIS (ARGON PLASMA COAGULATION/BICAP);  Surgeon: Juanita Craver, MD;  Location: [facility];  Service: Endoscopy;  Laterality: N/A;   HOT HEMOSTASIS N/A 05/23/2020   Procedure: HOT HEMOSTASIS (ARGON PLASMA COAGULATION/BICAP);  Surgeon: Jackquline Denmark, MD;  Location: [facility];  Service: Gastroenterology;  Laterality: N/A;  HOT HEMOSTASIS N/A 12/09/2020   Procedure: HOT HEMOSTASIS (ARGON PLASMA COAGULATION/BICAP);  Surgeon: Thornton Park, MD;  Location: [facility];  Service: Gastroenterology;  Laterality: N/A;   LYMPHADENECTOMY Bilateral 02/27/2014   Procedure: BILATERAL LYMPHADENECTOMY;  Surgeon: Alexis Frock, MD;  Location: [facility];  Service: Urology;  Laterality: Bilateral;   POLYPECTOMY  05/23/2020   Procedure: POLYPECTOMY;  Surgeon: Jackquline Denmark, MD;  Location: [facility];   Service: Gastroenterology;;   PROSTATE BIOPSY  12/2013   Gleason 4+3=7, volume 31.31 cc   ROBOT ASSISTED LAPAROSCOPIC RADICAL PROSTATECTOMY N/A 02/27/2014   Procedure: ROBOTIC ASSISTED LAPAROSCOPIC RADICAL PROSTATECTOMY WITH INDOCYANINE GREEN DYE;  Surgeon: Alexis Frock, MD;  Location: [facility];  Service: Urology;  Laterality: N/A;    There were no vitals filed for this visit.   Subjective Assessment - 01/14/21 1019     Subjective Pt reports that he has been doing well. Denies any issues with lightheaded or dizziness.    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 No/denies                               [facility] Adult PT Treatment/Exercise - 01/14/21 1024       Ambulation/Gait   Ambulation/Gait Yes    Ambulation/Gait Assistance 5: Supervision    Ambulation/Gait Assistance Details Pt was cued to keep cane out to the side some to not trip up his feet.    Ambulation Distance (Feet) 230 Feet    Assistive device Straight cane    Gait Pattern Step-through pattern    Ambulation Surface Level;Indoor      Neuro Re-ed    Neuro Re-ed Details  Dynamic gait activities in hallway: gait with head turns left/right 40' x 2 and up/down 40' x 2 with cane and close SBA/CGA. Pt denied any dizziness. Gait weaving in and out of 5 cones without AD x 4 bouts then tapping each cone and stepping over x 4 bouts. Pt less steady in left SLS. In // bars: standing on airex with fingertip support alternating taps on cone in front x 10 then standing feet together on airex without hands 20 sec x 3 with pt losing balance to right multiple times, spread out feet and repeated with some improvement but still losing balance to right. Standing on rockerboard  positioned lateral when trying to maintain level without hands 20 sec x 2 CGA/min assist. Again losing balance to right.      Knee/Hip Exercises: Aerobic   Other Aerobic SciFit x 6 min level 5 with BUE and BLE for strengthening and warm up to try to get BP up a bit with standing activities, cues for full ROM. HR=106 and O2 =99% after                     PT Education - 01/14/21 1059     Education Details Pt asking about how to help with constipation as has been having trouble. Has been 3 days since he went but does feel like needs to. Keeps happening. PT looked over meds and iron may be contributing. Is taking Colace. Suggested checking with MD to see if can increase dosage or perhaps try something like miralax if  ok with MD. Also encouraged fluid intake and movement.    Person(s) Educated Patient    Methods Explanation    Comprehension Verbalized understanding              PT Short Term Goals - 12/25/20 0856       PT SHORT TERM GOAL #1   Title Pt will be independent with initial HEP in order to build up functional gains made in therapy. ALL STGS DUE 12/23/20    Baseline 12/25/20 PT had not started HEP due to health issues prior to hospital stay    Time 4    Period Weeks    Status Not Met    Target Date 12/23/20      PT SHORT TERM GOAL #2   Title Pt will decr 5x sit <> stand time to 18 seconds or less in order to demo improved functional BLE strength.    Baseline 20.88 seconds without UE support. 12/25/20 5 x sit to stand=16.17 sec without hands    Time 4    Period Weeks    Status Achieved      PT SHORT TERM GOAL #3   Title Pt will improve gait speed with no AD to at least 3.3 ft/sec in order to demo improved gait efficiency.    Baseline 2.93 ft/sec. 12/25/20 2.23ft/sec    Time 4    Period Weeks    Status Not Met      PT SHORT TERM GOAL #4   Title Pt and pt's family will verbalize understanding of fall prevention in the home in order to demo decr fall risk.     Baseline 12/25/20 PT educated on fall prevention strategies around home and pt and his neice verbalized understanding.    Time 4    Period Weeks    Status Achieved      PT SHORT TERM GOAL #5   Title Pt will improve FGA score to at least a 19/30 in order to demo decr fall risk.    Baseline 16/30. 12/25/20 FGA=18/30    Time 4    Period Weeks    Status Partially Met               PT Long Term Goals - 11/25/20 1757       PT LONG TERM GOAL #1   Title Pt will be independent with final HEP in order to build up functional gains made in therapy. ALL LTGS DUE 01/20/21    Time 8    Period Weeks    Status New    Target Date 01/20/21      PT LONG TERM GOAL #2   Title Pt will decr 5x sit <> stand time to 16 seconds or less in order to demo improved functional BLE strength.    Baseline 20.88 seconds without UE support    Time 8    Period Weeks    Status New      PT LONG TERM GOAL #3   Title Pt will ambulate at least 300' outdoors over unlevel surfaces with supervision in order to demo improved community mobility.    Time 8    Period Weeks    Status New      PT LONG TERM GOAL #4   Title Pt will improve FGA score to at least a 22/30 in order to demo decr fall risk.    Baseline 16/30    Time 8    Period Weeks    Status New  PT LONG TERM GOAL #5   Title Pt will improve condition 4 of mCTSIB to at least 20 seconds in order to demo improved vestibular input for balance.    Baseline 7 seconds    Time 8    Period Weeks    Status New                   Plan - 01/14/21 1111     Clinical Impression Statement Pt was steadier with gait with cane today. PT focused on increasing SLS time with pt with less control with tapping cone with RLE and with standing on compliant surfaces pt loses balance to the right repeatedly.    Personal Factors and Comorbidities Comorbidity 3+;Past/Current Experience;Time since onset of injury/illness/exacerbation    Comorbidities CVA, alcohol use,  diabetes, hyperlipidemia, hypertension, iron deficiency anemia, depression, GERD, prostate cancer    Examination-Activity Limitations Stairs;Transfers;Locomotion Level    Examination-Participation Restrictions Community Activity;Driving    Stability/Clinical Decision Making Stable/Uncomplicated    Rehab Potential Good    PT Frequency 2x / week   12 visits over 8 weeks   PT Duration 8 weeks   12 visits over 8 weeks   PT Treatment/Interventions ADLs/Self Care Home Management;DME Instruction;Gait training;Stair training;Therapeutic activities;Functional mobility training;Therapeutic exercise;Balance training;Patient/family education;Neuromuscular re-education;Vestibular    PT Next Visit Plan Chloe- recert due next visit with goal check. This was week 8 and I didn't realize until finished. May want to do the 10th visit progress note as well 1 visit early. Pt does need more work on balance. Noted losing balance to right when standing on compliant surfaces. Check BP. Continue with balance training and gait on varied surfaces. BLE strengthening, SLS tasks. try Scifit/Nustep for strength/activity tolerance    PT Home Exercise Plan Access Code [email or web address]    Consulted and Agree with Plan of Care Patient;Family member/caregiver    Family Member Consulted Sister             Patient will benefit from skilled therapeutic intervention in order to improve the following deficits and impairments:  Abnormal gait, Decreased balance, Decreased strength, Impaired sensation, Postural dysfunction, Difficulty walking, Decreased activity tolerance  Visit Diagnosis: Other abnormalities of gait and mobility  Muscle weakness (generalized)  Unsteadiness on feet     Problem List Patient Active Problem List   Diagnosis Date Noted   Hospital discharge follow-up 12/24/2020   Weakness of both legs    Adenomatous duodenal polyp    AVM (arteriovenous malformation) of duodenum, acquired with hemorrhage    Need for  hepatitis C screening test 11/10/2020   Pneumococcal vaccination administered at current visit 11/10/2020   Healthcare maintenance 11/06/2020   Hypoglycemia 26/71/2458   Acute metabolic encephalopathy 09/98/3382   Acute on chronic anemia 10/22/2020   Angiodysplasia of intestine    GI bleed 10/05/2020   Melena    Upper GI bleed    Iron deficiency anemia 05/22/2020   Constipation 05/22/2020   GIB (gastrointestinal bleeding) 05/21/2020   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 ([place])    History of CVA (cerebrovascular accident) 04/23/2020   Hyperlipidemia associated with type 2 diabetes mellitus ([place]) 04/23/2020   Alcohol use 04/23/2020   AKI (acute kidney injury) ([place]) 04/23/2020   Tobacco use 04/23/2020   Unresponsive 11/08/2018   Syncopal seizure ([facility]) 11/08/2018   Hypertension associated with diabetes ([facility]) 11/08/2018   Alcohol withdrawal seizure with complication, with unspecified complication ([facility]) 50/53/9767   Type 2 diabetes mellitus without complication ([place]) 34/19/3790  Symptomatic anemia 04/15/2018   Prostate cancer ([place]) 02/27/2014   Malignant neoplasm of prostate ([place]) 01/09/2014   Bunion 01/17/2013   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    Electa Sniff, PT, DPT, NCS 01/14/2021, 11:16 AM  [facility] [address] [place], [place], [address] Phone: [phone]   Fax:  [phone]  Name: Gregory Nunez. MRN: [id number] Date of Birth: 1945/10/14

## 2021-01-19 ENCOUNTER — Telehealth: Payer: Self-pay | Admitting: Physical Therapy

## 2021-01-19 ENCOUNTER — Ambulatory Visit: Payer: Medicare Other | Admitting: Occupational Therapy

## 2021-01-19 ENCOUNTER — Other Ambulatory Visit: Payer: Self-pay | Admitting: Internal Medicine

## 2021-01-19 ENCOUNTER — Other Ambulatory Visit: Payer: Self-pay

## 2021-01-19 ENCOUNTER — Ambulatory Visit: Payer: Medicare Other | Attending: Internal Medicine | Admitting: Physical Therapy

## 2021-01-19 ENCOUNTER — Encounter: Payer: Self-pay | Admitting: Speech Pathology

## 2021-01-19 ENCOUNTER — Ambulatory Visit: Payer: Medicare Other | Admitting: Speech Pathology

## 2021-01-19 ENCOUNTER — Encounter: Payer: Self-pay | Admitting: Physical Therapy

## 2021-01-19 VITALS — BP 160/62 | HR 100

## 2021-01-19 DIAGNOSIS — I69318 Other symptoms and signs involving cognitive functions following cerebral infarction: Secondary | ICD-10-CM | POA: Diagnosis present

## 2021-01-19 DIAGNOSIS — R4701 Aphasia: Secondary | ICD-10-CM | POA: Diagnosis present

## 2021-01-19 DIAGNOSIS — R278 Other lack of coordination: Secondary | ICD-10-CM | POA: Diagnosis present

## 2021-01-19 DIAGNOSIS — R2689 Other abnormalities of gait and mobility: Secondary | ICD-10-CM | POA: Insufficient documentation

## 2021-01-19 DIAGNOSIS — R2681 Unsteadiness on feet: Secondary | ICD-10-CM | POA: Insufficient documentation

## 2021-01-19 DIAGNOSIS — R41841 Cognitive communication deficit: Secondary | ICD-10-CM | POA: Insufficient documentation

## 2021-01-19 DIAGNOSIS — M6281 Muscle weakness (generalized): Secondary | ICD-10-CM | POA: Insufficient documentation

## 2021-01-19 NOTE — Telephone Encounter (Signed)
Dr. Elliot Gurney,   I have been working with your patient Gregory Nunez at [facility] for PT. Today he was was feeling dizzy/lightheaded with standing and gait. His BP initially in sitting was 120/60 and in standing it dropped to 105/48.  I wanted to make you aware as it does not look like he has a follow up visit scheduled with you and it is limiting his ability to participate in Physical Therapy.  Thanks, Janann August, PT, DPT 01/19/21 2:00 [facility] [address] [place], [place]  [address] Phone:  [phone] Fax:  [phone]

## 2021-01-19 NOTE — Therapy (Signed)
[facility] [address] [place], [place], [address] Phone: [phone]   Fax:  [phone]  Occupational Therapy Treatment  Patient Details  Name: Gregory Nunez. MRN: [id number] Date of Birth: 12-01-1945 Referring Provider (OT): Dr. Champ Mungo   Encounter Date: 01/19/2021   OT End of Session - 01/19/21 1420     Visit Number 7    Number of Visits 21    Authorization Type [employer] MCR/MCD    Progress Note Due on Visit 10    OT Start Time 1415    OT Stop Time 1445    OT Time Calculation (min) 30 min    Activity Tolerance Patient tolerated treatment well    Behavior During Therapy WFL for tasks assessed/performed             Past Medical History:  Diagnosis Date   CAO (chronic airflow obstruction) (HCC)    Cataract    OD   CVA (cerebral vascular accident) (HCC)    Depression    Diabetes mellitus without complication (HCC)    Diabetic retinopathy (HCC)    NPDR OU   GERD (gastroesophageal reflux disease)    if drinks alcohol   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   Patient is approximately 5-week status post bunion correction left foot   Hyperlipidemia    Hypertension    Hypertensive retinopathy    OU   Malignant neoplasm of prostate (HCC) 01/09/2014   Neuropathy    Pancreatitis    Prostate cancer (HCC) 12/19/2013   Gleason 4+3=7, volume 31.31 cc   Sciatica    Shortness of breath dyspnea    with exertion     Past Surgical History:  Procedure Laterality Date   BIOPSY  04/16/2018   Procedure: BIOPSY;  Surgeon: Charna Elizabeth, MD;  Location: [facility];  Service: Endoscopy;;   BIOPSY  05/23/2020   Procedure: BIOPSY;  Surgeon: Lynann Bologna, MD;  Location: [facility];  Service: Gastroenterology;;  EGD and COLON   BIOPSY  12/09/2020   Procedure: BIOPSY;  Surgeon: Tressia Danas, MD;  Location: [facility];  Service: Gastroenterology;;   biopsy on throat     hx of    CATARACT EXTRACTION  Bilateral    Dr. Baker Pierini   COLONOSCOPY N/A 05/23/2020   Procedure: COLONOSCOPY;  Surgeon: Lynann Bologna, MD;  Location: [facility];  Service: Gastroenterology;  Laterality: N/A;   ENTEROSCOPY N/A 10/07/2020   Procedure: ENTEROSCOPY;  Surgeon: Iva Boop, MD;  Location: [facility] ENDOSCOPY;  Service: Endoscopy;  Laterality: N/A;   ESOPHAGOGASTRODUODENOSCOPY Left 04/16/2018   Procedure: ESOPHAGOGASTRODUODENOSCOPY (EGD);  Surgeon: Charna Elizabeth, MD;  Location: [facility]COPY;  Service: Endoscopy;  Laterality: Left;   ESOPHAGOGASTRODUODENOSCOPY (EGD) WITH PROPOFOL N/A 05/23/2020   Procedure: ESOPHAGOGASTRODUODENOSCOPY (EGD) WITH PROPOFOL;  Surgeon: Lynann Bologna, MD;  Location: [facility];  Service: Gastroenterology;  Laterality: N/A;   ESOPHAGOGASTRODUODENOSCOPY (EGD) WITH PROPOFOL N/A 12/09/2020   Procedure: ESOPHAGOGASTRODUODENOSCOPY (EGD) WITH PROPOFOL;  Surgeon: Tressia Danas, MD;  Location: [facility] ENDOSCOPY;  Service: Gastroenterology;  Laterality: N/A;   EYE SURGERY     FOOT SURGERY     HOT HEMOSTASIS N/A 04/16/2018   Procedure: HOT HEMOSTASIS (ARGON PLASMA COAGULATION/BICAP);  Surgeon: Charna Elizabeth, MD;  Location: [facility];  Service: Endoscopy;  Laterality: N/A;   HOT HEMOSTASIS N/A 05/23/2020   Procedure: HOT HEMOSTASIS (ARGON PLASMA COAGULATION/BICAP);  Surgeon: Lynann Bologna, MD;  Location: [facility];  Service: Gastroenterology;  Laterality: N/A;   HOT HEMOSTASIS N/A 12/09/2020  Procedure: HOT HEMOSTASIS (ARGON PLASMA COAGULATION/BICAP);  Surgeon: Thornton Park, MD;  Location: [facility];  Service: Gastroenterology;  Laterality: N/A;   LYMPHADENECTOMY Bilateral 02/27/2014   Procedure: BILATERAL LYMPHADENECTOMY;  Surgeon: Alexis Frock, MD;  Location: [facility];  Service: Urology;  Laterality: Bilateral;   POLYPECTOMY  05/23/2020   Procedure: POLYPECTOMY;  Surgeon: Jackquline Denmark, MD;  Location: [facility];  Service: Gastroenterology;;   PROSTATE BIOPSY  12/2013   Gleason 4+3=7,  volume 31.31 cc   ROBOT ASSISTED LAPAROSCOPIC RADICAL PROSTATECTOMY N/A 02/27/2014   Procedure: ROBOTIC ASSISTED LAPAROSCOPIC RADICAL PROSTATECTOMY WITH INDOCYANINE GREEN DYE;  Surgeon: Alexis Frock, MD;  Location: [facility];  Service: Urology;  Laterality: N/A;    There were no vitals filed for this visit.   Subjective Assessment - 01/19/21 1419     Subjective  I get dizzy sometimes    Pertinent History CVA x 2 (Jan and Feb 2022), recent syncopal seizure 10/05/20, DM w/ diabetic retinopathy, HTN, HLD, GERD    Limitations NO Driving, **recent seizure    Currently in Pain? No/denies             Unable to do cooking task today d/t orthostatic hypotension and dizziness with prior P.T. session.   Continued money exchange worksheet (slightly more challenging from last session) - pt required mod cueing t/o task. However, when performing money exchange with monopoly money, pt able to do 90% accurately with min cues/assist                       OT Short Term Goals - 01/14/21 1027       OT SHORT TERM GOAL #1   Title Independent with initial HEP for bilateral coordination and Rt grip strength    Time 5    Period Weeks    Status Achieved      OT SHORT TERM GOAL #2   Title Pt/family to verbalize acquisition of tub bench to prevent falls    Time 5    Period Weeks    Status Achieved      OT SHORT TERM GOAL #3   Title Pt to consistently perform snack prep and heat up microwaveable items    Time 5    Period Weeks    Status Achieved   cues for safety/fall prevention     OT SHORT TERM GOAL #4   Title Pt to wash dishes and fold towels consistently    Time 5    Period Weeks    Status On-going   washed 2 dishes in clinic on 01/07/21 w/ sup     OT SHORT TERM GOAL #5   Title Pt to improve coordination bilaterally by 10 sec    Baseline Rt = 1 min, Lt = 47 sec    Time 5    Period Weeks    Status Partially Met   met on Rt = 47.34 sec, not met on Lt = 40.66 sec (but  improved on Lt)              OT Long Term Goals - 01/14/21 0909       OT LONG TERM GOAL #1   Title Independent with UE HEP for strengthening    Time 10    Period Weeks    Status New      OT LONG TERM GOAL #2   Title Pt to improve Rt grip strength by 5 lbs or greater    Baseline 51 lbs (Lt = 60  lbs)    Time 10    Period Weeks    Status Achieved   Rt = 70.1 lbs, Lt = 71.8 lbs     OT LONG TERM GOAL #3   Title Pt to perform simple cooking task (egg) with distant supervision    Time 10    Period Weeks    Status New      OT LONG TERM GOAL #4   Title Pt to consistently perform light IADLS (all cleaning) at mod I level    Time 10    Period Weeks    Status New      OT LONG TERM GOAL #5   Title Pt to perform simple financial management with direct supervision    Time 10    Period Weeks    Status New                   Plan - 01/19/21 1451     Clinical Impression Statement Pt unable to do standing tasks today due to BP dropping and dizziness w/ previous P.T. session. Pt did well w/ money exchange using money vs. pencil/paper task    OT Occupational Profile and History Detailed Assessment- Review of Records and additional review of physical, cognitive, psychosocial history related to current functional performance    Occupational performance deficits (Please refer to evaluation for details): IADL's;ADL's;Social Participation    Body Structure / Function / Physical Skills Strength;Dexterity;UE functional use;IADL;Endurance;Mobility;Coordination;FMC;Vision    Cognitive Skills Problem Solve;Sequencing;Perception;Learn    Rehab Potential Good    Clinical Decision Making Several treatment options, min-mod task modification necessary    Comorbidities Affecting Occupational Performance: Presence of comorbidities impacting occupational performance    Comorbidities impacting occupational performance description: seizure, diabetic retinopathy    Modification or Assistance to  Complete Evaluation  No modification of tasks or assist necessary to complete eval    OT Frequency 2x / week    OT Duration --   10 weeks (anticipate only 6-8 weeks)   OT Treatment/Interventions Self-care/ADL training;DME and/or AE instruction;Therapeutic activities;Therapeutic exercise;Cognitive remediation/compensation;Coping strategies training;Visual/perceptual remediation/compensation;Functional Mobility Training;Neuromuscular education;Manual Therapy;Patient/family education    Plan simple cooking task (egg) if BP stable    Consulted and Agree with Plan of Care Patient;Family member/caregiver    Family Member Consulted niece [name])             Patient will benefit from skilled therapeutic intervention in order to improve the following deficits and impairments:   Body Structure / Function / Physical Skills: Strength, Dexterity, UE functional use, IADL, Endurance, Mobility, Coordination, FMC, Vision Cognitive Skills: Problem Solve, Sequencing, Perception, Learn     Visit Diagnosis: Other lack of coordination  Unsteadiness on feet  Other symptoms and signs involving cognitive functions following cerebral infarction    Problem List Patient Active Problem List   Diagnosis Date Noted   Hospital discharge follow-up 12/24/2020   Weakness of both legs    Adenomatous duodenal polyp    AVM (arteriovenous malformation) of duodenum, acquired with hemorrhage    Need for hepatitis C screening test 11/10/2020   Pneumococcal vaccination administered at current visit 11/10/2020   Healthcare maintenance 11/06/2020   Hypoglycemia 69/62/9528   Acute metabolic encephalopathy 41/32/4401   Acute on chronic anemia 10/22/2020   Angiodysplasia of intestine    GI bleed 10/05/2020   Melena    Upper GI bleed    Iron deficiency anemia 05/22/2020   Constipation 05/22/2020   GIB (gastrointestinal bleeding) 05/21/2020   Diabetic retinopathy associated  with type 2 diabetes mellitus ([place])     History of CVA (cerebrovascular accident) 04/23/2020   Hyperlipidemia associated with type 2 diabetes mellitus ([place]) 04/23/2020   Alcohol use 04/23/2020   AKI (acute kidney injury) ([place]) 04/23/2020   Tobacco use 04/23/2020   Unresponsive 11/08/2018   Syncopal seizure ([place]) 11/08/2018   Hypertension associated with diabetes ([place]) 11/08/2018   Alcohol withdrawal seizure with complication, with unspecified complication ([place]) 73/19/2438   Type 2 diabetes mellitus without complication ([place]) 36/54/2715   Symptomatic anemia 04/15/2018   Prostate cancer ([place]) 02/27/2014   Malignant neoplasm of prostate ([place]) 01/09/2014   Bunion 01/17/2013   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    Carey Bullocks, OTR/L 01/19/2021, 2:56 PM  [facility] [address] [place], [place], [address] Phone: [phone]   Fax:  [phone]  Name: Layn Kye. MRN: [id number] Date of Birth: 08-24-1945

## 2021-01-19 NOTE — Therapy (Signed)
[facility] [address] [place], [place], [address] Phone: [phone]   Fax:  [phone]  Physical Therapy Treatment-Arrived No Charge   Patient Details  Name: Gregory Nunez. MRN: [id number] Date of Birth: May 07, 1945 Referring Provider (PT): Velna Ochs, MD   Encounter Date: 01/19/2021   PT End of Session - 01/19/21 1404     Visit Number 8   arrived no charge   Number of Visits 13    Date for PT Re-Evaluation 01/24/21    Authorization Type [employer] Medicare    PT Start Time 1320    PT Stop Time 1400    PT Time Calculation (min) 40 min    Equipment Utilized During Treatment Gait belt    Activity Tolerance Treatment limited secondary to medical complications (Comment)   limited by low BP   Behavior During Therapy WFL for tasks assessed/performed             Past Medical History:  Diagnosis Date   CAO (chronic airflow obstruction) (HCC)    Cataract    OD   CVA (cerebral vascular accident) ([place])    Depression    Diabetes mellitus without complication ([place])    Diabetic retinopathy ([place])    NPDR OU   GERD (gastroesophageal reflux disease)    if drinks alcohol   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   Patient is approximately 5-week status post bunion correction left foot   Hyperlipidemia    Hypertension    Hypertensive retinopathy    OU   Malignant neoplasm of prostate ([place]) 01/09/2014   Neuropathy    Pancreatitis    Prostate cancer ([place]) 12/19/2013   Gleason 4+3=7, volume 31.31 cc   Sciatica    Shortness of breath dyspnea    with exertion     Past Surgical History:  Procedure Laterality Date   BIOPSY  04/16/2018   Procedure: BIOPSY;  Surgeon: Juanita Craver, MD;  Location: [facility];  Service: Endoscopy;;   BIOPSY  05/23/2020   Procedure: BIOPSY;  Surgeon: Jackquline Denmark, MD;  Location: [facility];  Service: Gastroenterology;;  EGD and COLON   BIOPSY  12/09/2020   Procedure:  BIOPSY;  Surgeon: Thornton Park, MD;  Location: [facility];  Service: Gastroenterology;;   biopsy on throat     hx of    CATARACT EXTRACTION Bilateral    Dr. Quentin Ore   COLONOSCOPY N/A 05/23/2020   Procedure: COLONOSCOPY;  Surgeon: Jackquline Denmark, MD;  Location: [facility];  Service: Gastroenterology;  Laterality: N/A;   ENTEROSCOPY N/A 10/07/2020   Procedure: ENTEROSCOPY;  Surgeon: [name], MD;  Location: [facility];  Service: Endoscopy;  Laterality: N/A;   ESOPHAGOGASTRODUODENOSCOPY Left 04/16/2018   Procedure: ESOPHAGOGASTRODUODENOSCOPY (EGD);  Surgeon: Juanita Craver, MD;  Location: [facility];  Service: Endoscopy;  Laterality: Left;   ESOPHAGOGASTRODUODENOSCOPY (EGD) WITH PROPOFOL N/A 05/23/2020   Procedure: ESOPHAGOGASTRODUODENOSCOPY (EGD) WITH PROPOFOL;  Surgeon: Jackquline Denmark, MD;  Location: [facility];  Service: Gastroenterology;  Laterality: N/A;   ESOPHAGOGASTRODUODENOSCOPY (EGD) WITH PROPOFOL N/A 12/09/2020   Procedure: ESOPHAGOGASTRODUODENOSCOPY (EGD) WITH PROPOFOL;  Surgeon: Thornton Park, MD;  Location: [facility];  Service: Gastroenterology;  Laterality: N/A;   EYE SURGERY     FOOT SURGERY     HOT HEMOSTASIS N/A 04/16/2018   Procedure: HOT HEMOSTASIS (ARGON PLASMA COAGULATION/BICAP);  Surgeon: Juanita Craver, MD;  Location: [facility];  Service: Endoscopy;  Laterality: N/A;   HOT HEMOSTASIS N/A 05/23/2020   Procedure: HOT HEMOSTASIS (ARGON PLASMA COAGULATION/BICAP);  Surgeon: Lynann Bologna, MD;  Location: [facility];  Service: Gastroenterology;  Laterality: N/A;   HOT HEMOSTASIS N/A 12/09/2020   Procedure: HOT HEMOSTASIS (ARGON PLASMA COAGULATION/BICAP);  Surgeon: Tressia Danas, MD;  Location: [facility] END[facility]COPY;  Service: Gastroenterology;  Laterality: N/A;   LYMPHADENECTOMY Bilateral 02/27/2014   Procedure: BILATERAL LYMPHADENECTOMY;  Surgeon: Sebastian Ache, MD;  Location: [facility];  Service: Urology;  Laterality: Bilateral;   POLYPECTOMY  05/23/2020    Procedure: POLYPECTOMY;  Surgeon: Lynann Bologna, MD;  Location: [facility];  Service: Gastroenterology;;   PROSTATE BIOPSY  12/2013   Gleason 4+3=7, volume 31.31 cc   ROBOT ASSISTED LAPAROSCOPIC RADICAL PROSTATECTOMY N/A 02/27/2014   Procedure: ROBOTIC ASSISTED LAPAROSCOPIC RADICAL PROSTATECTOMY WITH INDOCYANINE GREEN DYE;  Surgeon: Sebastian Ache, MD;  Location: [facility];  Service: Urology;  Laterality: N/A;    Vitals:   01/19/21 1323 01/19/21 1343 01/19/21 1346 01/19/21 1355  BP: (!) 110/58 120/60 (!) 105/48 (!) 160/62  Pulse: (!) 115 100    SpO2: 96% 99%       Subjective Assessment - 01/19/21 1327     Subjective Got lightheaded walking back to PT, needing to stop early and sit down in a chair. No falls. Reports exercises at home have been helping a lot. Reports that today is the first time in a while that he has had dizziness in the afternoon.    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 No/denies                [facility] PT Assessment - 01/19/21 1334       Assessment   Medical Diagnosis CVA    Referring Provider (PT) Reymundo Poll, MD    Hand Dominance Right      Prior Function   Level of Independence Independent      High Level Balance   High Level Balance Comments mCTSIB: condition 1: 30 seconds, condition 2= 23 seconds, condition 3= 25 seconds mild postural sway, did not get to finish checking due to pt getting dizzy/lightheaded and needing to sit down.             Pt ambulating into therapy gym from speech therapy with PT. Pt wavering more and needing min A to steady for balance and pt reporting feeling lightheaded/dizzy with another therapist needing to get a chair for pt to sit in. Assessed pt's BP (110/58) and  HR at 115 bpm with O2 at 96%. Pt reporting that he took his BP meds today, ate breakfast/lunch and drank plenty of water. Discussed performing ankle pumps/marching prior to standing up for improved circulation and to take time with transitions. Gave pt more water during session. Attempted to assess mCTSIB, but pt reporting feeling dizzy/lightheaded and needing to sit after attempting the 3rd trial. Pt with orthostatics noted today (see vitals above). Pt with prolonged seated rest break and then ambulated with pt back to mat table with pt reporting feeling lightheaded. Pt with an additional prolonged seated rest break before OT with BP increasing. PT sent pt's PCP an update with orthostatics today and how it is limiting pt's ability to participate in PT.               [facility] Adult PT Treatment/Exercise - 01/19/21 1331  Ambulation/Gait   Ambulation/Gait Yes    Ambulation/Gait Assistance 5: Supervision    Ambulation/Gait Assistance Details Cued for cane placement. Performed after seated rest break at start of session. No dizziness/lightheadedness reported.    Ambulation Distance (Feet) 115 Feet    Assistive device Straight cane    Gait Pattern Step-through pattern;Narrow base of support    Ambulation Surface Level;Indoor                       PT Short Term Goals - 12/25/20 0856       PT SHORT TERM GOAL #1   Title Pt will be independent with initial HEP in order to build up functional gains made in therapy. ALL STGS DUE 12/23/20    Baseline 12/25/20 PT had not started HEP due to health issues prior to hospital stay    Time 4    Period Weeks    Status Not Met    Target Date 12/23/20      PT SHORT TERM GOAL #2   Title Pt will decr 5x sit <> stand time to 18 seconds or less in order to demo improved functional BLE strength.    Baseline 20.88 seconds without UE support. 12/25/20 5 x sit to stand=16.17 sec without hands    Time 4    Period Weeks    Status Achieved       PT SHORT TERM GOAL #3   Title Pt will improve gait speed with no AD to at least 3.3 ft/sec in order to demo improved gait efficiency.    Baseline 2.93 ft/sec. 12/25/20 2.75ft/sec    Time 4    Period Weeks    Status Not Met      PT SHORT TERM GOAL #4   Title Pt and pt's family will verbalize understanding of fall prevention in the home in order to demo decr fall risk.    Baseline 12/25/20 PT educated on fall prevention strategies around home and pt and his neice verbalized understanding.    Time 4    Period Weeks    Status Achieved      PT SHORT TERM GOAL #5   Title Pt will improve FGA score to at least a 19/30 in order to demo decr fall risk.    Baseline 16/30. 12/25/20 FGA=18/30    Time 4    Period Weeks    Status Partially Met               PT Long Term Goals - 11/25/20 1757       PT LONG TERM GOAL #1   Title Pt will be independent with final HEP in order to build up functional gains made in therapy. ALL LTGS DUE 01/20/21    Time 8    Period Weeks    Status New    Target Date 01/20/21      PT LONG TERM GOAL #2   Title Pt will decr 5x sit <> stand time to 16 seconds or less in order to demo improved functional BLE strength.    Baseline 20.88 seconds without UE support    Time 8    Period Weeks    Status New      PT LONG TERM GOAL #3   Title Pt will ambulate at least 300' outdoors over unlevel surfaces with supervision in order to demo improved community mobility.    Time 8    Period Weeks    Status New  PT LONG TERM GOAL #4   Title Pt will improve FGA score to at least a 22/30 in order to demo decr fall risk.    Baseline 16/30    Time 8    Period Weeks    Status New      PT LONG TERM GOAL #5   Title Pt will improve condition 4 of mCTSIB to at least 20 seconds in order to demo improved vestibular input for balance.    Baseline 7 seconds    Time 8    Period Weeks    Status New                   Plan - 01/19/21 1507     Clinical Impression  Statement Plan today was for PT to assess pt's LTGs and perform re-cert. Unable to assess today as pt unable to tolerate standing activity due to feeling lightheaded/dizzy. Pt with a drop in BP today in standing. Will assess LTGs when pt is able to tolerate standing/gait and perform re-cert when appropriate. See above note for further information.    Personal Factors and Comorbidities Comorbidity 3+;Past/Current Experience;Time since onset of injury/illness/exacerbation    Comorbidities CVA, alcohol use, diabetes, hyperlipidemia, hypertension, iron deficiency anemia, depression, GERD, prostate cancer    Examination-Activity Limitations Stairs;Transfers;Locomotion Level    Examination-Participation Restrictions Community Activity;Driving    Stability/Clinical Decision Making Stable/Uncomplicated    Rehab Potential Good    PT Frequency 2x / week   12 visits over 8 weeks   PT Duration 8 weeks   12 visits over 8 weeks   PT Treatment/Interventions ADLs/Self Care Home Management;DME Instruction;Gait training;Stair training;Therapeutic activities;Functional mobility training;Therapeutic exercise;Balance training;Patient/family education;Neuromuscular re-education;Vestibular    PT Next Visit Plan Mya Suell- recert due next visit with goal check.May want to do the 10th visit progress note as well 1 visit early. Pt does need more work on balance. Noted losing balance to right when standing on compliant surfaces. Check BP. Continue with balance training and gait on varied surfaces. BLE strengthening, SLS tasks. try Scifit/Nustep for strength/activity tolerance    PT Home Exercise Plan Access Code [email or web address]    Consulted and Agree with Plan of Care Patient;Family member/caregiver    Family Member Consulted Sister             Patient will benefit from skilled therapeutic intervention in order to improve the following deficits and impairments:  Abnormal gait, Decreased balance, Decreased strength, Impaired  sensation, Postural dysfunction, Difficulty walking, Decreased activity tolerance  Visit Diagnosis: Other lack of coordination  Muscle weakness (generalized)  Other abnormalities of gait and mobility     Problem List Patient Active Problem List   Diagnosis Date Noted   Hospital discharge follow-up 12/24/2020   Weakness of both legs    Adenomatous duodenal polyp    AVM (arteriovenous malformation) of duodenum, acquired with hemorrhage    Need for hepatitis C screening test 11/10/2020   Pneumococcal vaccination administered at current visit 11/10/2020   Healthcare maintenance 11/06/2020   Hypoglycemia 98/26/4158   Acute metabolic encephalopathy 30/94/0768   Acute on chronic anemia 10/22/2020   Angiodysplasia of intestine    GI bleed 10/05/2020   Melena    Upper GI bleed    Iron deficiency anemia 05/22/2020   Constipation 05/22/2020   GIB (gastrointestinal bleeding) 05/21/2020   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 ([place])    History of CVA (cerebrovascular accident) 04/23/2020   Hyperlipidemia associated with type 2 diabetes mellitus ([place])  04/23/2020   Alcohol use 04/23/2020   AKI (acute kidney injury) ([place]) 04/23/2020   Tobacco use 04/23/2020   Unresponsive 11/08/2018   Syncopal seizure ([place]) 11/08/2018   Hypertension associated with diabetes ([place]) 11/08/2018   Alcohol withdrawal seizure with complication, with unspecified complication ([place]) 65/82/6088   Type 2 diabetes mellitus without complication ([place]) 83/58/4465   Symptomatic anemia 04/15/2018   Prostate cancer ([place]) 02/27/2014   Malignant neoplasm of prostate ([place]) 01/09/2014   Bunion 01/17/2013   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    Arliss Journey, PT, DPT  01/19/2021, 3:30 PM  [facility] [address] [place], [place], [address] Phone: [phone]   Fax:  [phone]  Name: Feliz Lincoln. MRN:  [id number] Date of Birth: 1946-01-08

## 2021-01-19 NOTE — Patient Instructions (Signed)
    Glasses to therapy each session  Keep working on [employer] in binder  Bring back monthly bills [employer] job remembering your binder!!

## 2021-01-19 NOTE — Therapy (Signed)
[facility] [address] [place], [place], [address] Phone: [phone]   Fax:  [phone]  Speech Language Pathology Treatment  Patient Details  Name: Gregory Nunez. MRN: [id number] Date of Birth: 04-15-1946 Referring Provider (SLP): Dr. Velna Ochs   Encounter Date: 01/19/2021   End of Session - 01/19/21 1326     Visit Number 11    Number of Visits 25    Date for SLP Re-Evaluation 02/09/21    SLP Start Time 1232    SLP Stop Time  7322    SLP Time Calculation (min) 43 min    Activity Tolerance Patient tolerated treatment well             Past Medical History:  Diagnosis Date   CAO (chronic airflow obstruction) ([place])    Cataract    OD   CVA (cerebral vascular accident) ([place])    Depression    Diabetes mellitus without complication ([place])    Diabetic retinopathy ([place])    NPDR OU   GERD (gastroesophageal reflux disease)    if drinks alcohol   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   Patient is approximately 5-week status post bunion correction left foot   Hyperlipidemia    Hypertension    Hypertensive retinopathy    OU   Malignant neoplasm of prostate ([place]) 01/09/2014   Neuropathy    Pancreatitis    Prostate cancer ([place]) 12/19/2013   Gleason 4+3=7, volume 31.31 cc   Sciatica    Shortness of breath dyspnea    with exertion     Past Surgical History:  Procedure Laterality Date   BIOPSY  04/16/2018   Procedure: BIOPSY;  Surgeon: Juanita Craver, MD;  [facility]: [facility];  Service: Endoscopy;;   BIOPSY  05/23/2020   Procedure: BIOPSY;  Surgeon: Jackquline Denmark, MD;  [facility];  Service: Gastroenterology;;  EGD and COLON   BIOPSY  12/09/2020   Procedure: BIOPSY;  Surgeon: Thornton Park, MD;  Location: [facility];  Service: Gastroenterology;;   biopsy on throat     hx of    CATARACT EXTRACTION Bilateral    Dr. Quentin Ore   COLONOSCOPY N/A 05/23/2020   Procedure:  COLONOSCOPY;  Surgeon: Jackquline Denmark, MD;  [facility];  Service: Gastroenterology;  Laterality: N/A;   ENTEROSCOPY N/A 10/07/2020   Procedure: ENTEROSCOPY;  Surgeon: [name], MD;  Location: [facility];  Service: Endoscopy;  Laterality: N/A;   ESOPHAGOGASTRODUODENOSCOPY Left 04/16/2018   Procedure: ESOPHAGOGASTRODUODENOSCOPY (EGD);  Surgeon: Juanita Craver, MD;  Location: [facility];  Service: Endoscopy;  Laterality: Left;   ESOPHAGOGASTRODUODENOSCOPY (EGD) WITH PROPOFOL N/A 05/23/2020   Procedure: ESOPHAGOGASTRODUODENOSCOPY (EGD) WITH PROPOFOL;  Surgeon: Jackquline Denmark, MD;  [facility];  Service: Gastroenterology;  Laterality: N/A;   ESOPHAGOGASTRODUODENOSCOPY (EGD) WITH PROPOFOL N/A 12/09/2020   Procedure: ESOPHAGOGASTRODUODENOSCOPY (EGD) WITH PROPOFOL;  Surgeon: Thornton Park, MD;  Location: [facility];  Service: Gastroenterology;  Laterality: N/A;   EYE SURGERY     FOOT SURGERY     HOT HEMOSTASIS N/A 04/16/2018   Procedure: HOT HEMOSTASIS (ARGON PLASMA COAGULATION/BICAP);  Surgeon: Juanita Craver, MD;  Location: [facility];  Service: Endoscopy;  Laterality: N/A;   HOT HEMOSTASIS N/A 05/23/2020   Procedure: HOT HEMOSTASIS (ARGON PLASMA COAGULATION/BICAP);  Surgeon: Jackquline Denmark, MD;  Location: [facility];  Service: Gastroenterology;  Laterality: N/A;   HOT HEMOSTASIS N/A 12/09/2020   Procedure: HOT HEMOSTASIS (ARGON PLASMA COAGULATION/BICAP);  Surgeon: Thornton Park, MD;  Location: [facility];  Service:  Gastroenterology;  Laterality: N/A;   LYMPHADENECTOMY Bilateral 02/27/2014   Procedure: BILATERAL LYMPHADENECTOMY;  Surgeon: Alexis Frock, MD;  [facility];  Service: Urology;  Laterality: Bilateral;   POLYPECTOMY  05/23/2020   Procedure: POLYPECTOMY;  Surgeon: Jackquline Denmark, MD;  [facility];  Service: Gastroenterology;;   PROSTATE BIOPSY  12/2013   Gleason 4+3=7, volume 31.31 cc   ROBOT ASSISTED LAPAROSCOPIC RADICAL PROSTATECTOMY N/A  02/27/2014   Procedure: ROBOTIC ASSISTED LAPAROSCOPIC RADICAL PROSTATECTOMY WITH INDOCYANINE GREEN DYE;  Surgeon: Alexis Frock, MD;  [facility];  Service: Urology;  Laterality: N/A;    There were no vitals filed for this visit.   Subjective Assessment - 01/19/21 1238     Subjective "I'm doing good"    Currently in Pain? No/denies                   ADULT SLP TREATMENT - 01/19/21 1242       General Information   Behavior/Cognition Alert;Cooperative;Pleasant mood;Requires cueing      Treatment Provided   Treatment provided Cognitive-Linquistic      Cognitive-Linquistic Treatment   Treatment focused on Cognition;Aphasia;Patient/family/caregiver education    Skilled Treatment Gregory Nunez brought in binder, with ST HW sheets not complete - he did not return his monthly bills/budget sheet, instructed him to do this. Mildly complex naming and written expression with grocery cateegories (non perisable, super bowl party food). Gregory Nunez independently named 10/16 words,and 6/16 with semantic or fill in blank cues. Written words requried cues for spelling 16/16 however, Gregory Nunez noted errors or asked for help 10/16 words.      Assessment / Recommendations / Plan   Plan Continue with current plan of care      Progression Toward Goals   Progression toward goals Progressing toward goals              SLP Education - 01/19/21 1323     Education Details Bring glasses to ST, return monthly bills sheet    Person(s) Educated Patient    Methods Explanation;Verbal cues;Handout    Comprehension Verbalized understanding;Verbal cues required              SLP Short Term Goals - 01/19/21 1324       SLP SHORT TERM GOAL #1   Title Pt will use external aids to recall am meds with 3 or less reminders from family over 1 week    Baseline family remidning his consistently every day    Time 1    Period Weeks    Status Not Met      SLP SHORT TERM GOAL #2   Title Gregory Nunez will carryover  2 compensations for memory and attention to complete 3 house hold chores a week with occasiona min A from family    Baseline not completing house hold chores    Time 1    Period Weeks    Status Not Met      SLP SHORT TERM GOAL #3   Title 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 18/20 sentences with occasional min A    Time 1    Period Weeks    Status Not Met      SLP SHORT TERM GOAL #4   Title Pt will name 10 items for personally relevant category using verbal compensations as needed with occasional min A over 2 sessions    Time 1    Period Weeks    Status Not Met      SLP  SHORT TERM GOAL #5   Title Pt will fill out 2 money orders to pay bills with occasional min A from family    Baseline Nephew is paying his bills for him    Time 1    Period Weeks    Status Not Met              SLP Long Term Goals - 01/19/21 1324       SLP LONG TERM GOAL #1   Title Pt will use external aids and compensations to manage his medications, appointments and daily to do list with occasional min A from family    Baseline Family managing meds and appointments    Time 4    Period Weeks    Status On-going      SLP LONG TERM GOAL #2   Title 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 over 15 minute conversation to be 95% intelligible with occasional min A    Time 4    Period Weeks    Status On-going      SLP LONG TERM GOAL #3   Title Pt will ID aphasic errors in simple conversation with 3 or less verbal cues and attempt to self correct errors    Baseline Pt unaware of errors    Time 4    Period Weeks    Status On-going      SLP LONG TERM GOAL #4   Title Pt will use organization system, calendar to pay 3 bills on time with occasional min A from family.    Baseline family managing his finances    Time 4    Period Weeks    Status On-going      SLP LONG TERM GOAL #5   Title Family will rate Gregory Nunez' communciation on The Communicative Participation Item  Bank General Short Form a 12 or higher    Baseline rated a score of 9    Time 4    Period Weeks    Status On-going      SLP LONG TERM GOAL #6   Title Pt will carryover 3 strategies for attention and memory to cook3  simple meals with supervision from family    Baseline not cooking    Time 5    Period Weeks    Status Achieved              Plan - 01/19/21 1324     Clinical Impression Statement Mild to moderate aphasia improving. Moderate cognitive impairment persists affecting safety, decision making, sequencing. Targeted 4 tasks he would need to do to live indepenently, ID'ing safety concers and solutions with frequent mod A. Word finding , spelling, error awareness targeted generating list of foods he would need to upon return home with consistent max A for error awareness. Gregory Nunez continues to required supervision from family for IADL's, but has improved partcipation in house hold chores of dishes, changing his sheets. [place] skilled ST to maximize communication and cognition for possible return home independently which is pt's goal.    Speech Therapy Frequency 2x / week    Duration 12 weeks    Treatment/Interventions Environmental controls;Cueing hierarchy;Compensatory strategies;Functional tasks;Cognitive reorganization;Compensatory techniques;Patient/family education;Multimodal communcation approach;Internal/external aids    Potential to Achieve Goals Fair    Potential Considerations Co-morbidities;Previous level of function;Cooperation/participation level             Patient will benefit from skilled therapeutic intervention in order to improve the following deficits and impairments:   Aphasia  Cognitive communication  deficit    Problem List Patient Active Problem List   Diagnosis Date Noted   Hospital discharge follow-up 12/24/2020   Weakness of both legs    Adenomatous duodenal polyp    AVM (arteriovenous malformation) of duodenum, acquired with hemorrhage     Need for hepatitis C screening test 11/10/2020   Pneumococcal vaccination administered at current visit 11/10/2020   Healthcare maintenance 11/06/2020   Hypoglycemia 49/44/9675   Acute metabolic encephalopathy 91/63/8466   Acute on chronic anemia 10/22/2020   Angiodysplasia of intestine    GI bleed 10/05/2020   Melena    Upper GI bleed    Iron deficiency anemia 05/22/2020   Constipation 05/22/2020   GIB (gastrointestinal bleeding) 05/21/2020   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 ([place])    History of CVA (cerebrovascular accident) 04/23/2020   Hyperlipidemia associated with type 2 diabetes mellitus ([place]) 04/23/2020   Alcohol use 04/23/2020   AKI (acute kidney injury) ([place]) 04/23/2020   Tobacco use 04/23/2020   Unresponsive 11/08/2018   Syncopal seizure ([place]) 11/08/2018   Hypertension associated with diabetes ([place]) 11/08/2018   Alcohol withdrawal seizure with complication, with unspecified complication ([place]) 59/93/5701   Type 2 diabetes mellitus without complication ([place]) 77/93/9030   Symptomatic anemia 04/15/2018   Prostate cancer ([place]) 02/27/2014   Malignant neoplasm of prostate ([place]) 01/09/2014   Bunion 01/17/2013   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    , Annye Rusk MS, CCC-SLP 01/19/2021, 1:27 PM  [facility] [address] [place], [place], [address] Phone: [phone]   Fax:  [phone]   Name: Gregory Nunez. MRN: [id number] Date of Birth: 1945-08-16

## 2021-01-20 ENCOUNTER — Ambulatory Visit: Payer: Medicare Other | Admitting: Nurse Practitioner

## 2021-01-21 ENCOUNTER — Ambulatory Visit: Payer: Medicare Other | Admitting: Speech Pathology

## 2021-01-21 ENCOUNTER — Encounter: Payer: Self-pay | Admitting: Physical Therapy

## 2021-01-21 ENCOUNTER — Ambulatory Visit: Payer: Medicare Other | Admitting: Physical Therapy

## 2021-01-21 ENCOUNTER — Encounter: Payer: Self-pay | Admitting: Speech Pathology

## 2021-01-21 ENCOUNTER — Other Ambulatory Visit: Payer: Self-pay

## 2021-01-21 ENCOUNTER — Ambulatory Visit: Payer: Medicare Other | Admitting: Occupational Therapy

## 2021-01-21 VITALS — BP 115/58

## 2021-01-21 VITALS — BP 116/58

## 2021-01-21 DIAGNOSIS — R278 Other lack of coordination: Secondary | ICD-10-CM | POA: Diagnosis not present

## 2021-01-21 DIAGNOSIS — R2681 Unsteadiness on feet: Secondary | ICD-10-CM

## 2021-01-21 DIAGNOSIS — R2689 Other abnormalities of gait and mobility: Secondary | ICD-10-CM

## 2021-01-21 DIAGNOSIS — M6281 Muscle weakness (generalized): Secondary | ICD-10-CM

## 2021-01-21 DIAGNOSIS — I69318 Other symptoms and signs involving cognitive functions following cerebral infarction: Secondary | ICD-10-CM

## 2021-01-21 DIAGNOSIS — R41841 Cognitive communication deficit: Secondary | ICD-10-CM

## 2021-01-21 NOTE — Therapy (Signed)
[facility] [address] [place], [place], [address] Phone: [phone]   Fax:  [phone]  Occupational Therapy Treatment  Patient Details  Name: Gregory Nunez. MRN: [id number] Date of Birth: [birth year] Referring Provider (OT): Dr. Champ Mungo   Encounter Date: 01/21/2021   OT End of Session - 01/21/21 0916     Visit Number 8    Number of Visits 21    Authorization Type [employer] MCR/MCD    Progress Note Due on Visit 10    OT Start Time 0845    OT Stop Time 0930    OT Time Calculation (min) 45 min    Activity Tolerance Patient tolerated treatment well    Behavior During Therapy WFL for tasks assessed/performed             Past Medical History:  Diagnosis Date   CAO (chronic airflow obstruction) ([facility])    Cataract    OD   CVA (cerebral vascular accident) ([facility])    Depression    Diabetes mellitus without complication ([facility])    Diabetic retinopathy ([facility])    NPDR OU   GERD (gastroesophageal reflux disease)    if drinks alcohol   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   Patient is approximately 5-week status post bunion correction left foot   Hyperlipidemia    Hypertension    Hypertensive retinopathy    OU   Malignant neoplasm of prostate ([facility]) 01/09/2014   Neuropathy    Pancreatitis    Prostate cancer ([facility]) 12/19/2013   Gleason 4+3=7, volume 31.31 cc   Sciatica    Shortness of breath dyspnea    with exertion     Past Surgical History:  Procedure Laterality Date   BIOPSY  04/16/2018   Procedure: BIOPSY;  Surgeon: Charna Elizabeth, MD;  [facility];  Service: Endoscopy;;   BIOPSY  05/23/2020   Procedure: BIOPSY;  Surgeon: Lynann Bologna, MD;  [facility];  Service: Gastroenterology;;  EGD and COLON   BIOPSY  12/09/2020   Procedure: BIOPSY;  Surgeon: Tressia Danas, MD;  Location: [facility];  Service: Gastroenterology;;   biopsy on throat     hx of    CATARACT EXTRACTION  Bilateral    Dr. Baker Pierini   COLONOSCOPY N/A 05/23/2020   Procedure: COLONOSCOPY;  Surgeon: Lynann Bologna, MD;  [facility];  Service: Gastroenterology;  Laterality: N/A;   ENTEROSCOPY N/A 10/07/2020   Procedure: ENTEROSCOPY;  Surgeon: Iva Boop, MD;  Location: [facility] END[facility]COPY;  Service: Endoscopy;  Laterality: N/A;   ESOPHAGOGASTRODUODENOSCOPY Left 04/16/2018   Procedure: ESOPHAGOGASTRODUODENOSCOPY (EGD);  Surgeon: Charna Elizabeth, MD;  Location: [facility];  Service: Endoscopy;  Laterality: Left;   ESOPHAGOGASTRODUODENOSCOPY (EGD) WITH PROPOFOL N/A 05/23/2020   Procedure: ESOPHAGOGASTRODUODENOSCOPY (EGD) WITH PROPOFOL;  Surgeon: Lynann Bologna, MD;  [facility];  Service: Gastroenterology;  Laterality: N/A;   ESOPHAGOGASTRODUODENOSCOPY (EGD) WITH PROPOFOL N/A 12/09/2020   Procedure: ESOPHAGOGASTRODUODENOSCOPY (EGD) WITH PROPOFOL;  Surgeon: Tressia Danas, MD;  Location: [facility]COPY;  Service: Gastroenterology;  Laterality: N/A;   EYE SURGERY     FOOT SURGERY     HOT HEMOSTASIS N/A 04/16/2018   Procedure: HOT HEMOSTASIS (ARGON PLASMA COAGULATION/BICAP);  Surgeon: Charna Elizabeth, MD;  Location: [facility];  Service: Endoscopy;  Laterality: N/A;   HOT HEMOSTASIS N/A 05/23/2020   Procedure: HOT HEMOSTASIS (ARGON PLASMA COAGULATION/BICAP);  Surgeon: Lynann Bologna, MD;  Location: [facility];  Service: Gastroenterology;  Laterality: N/A;   HOT HEMOSTASIS N/A 12/09/2020  Procedure: HOT HEMOSTASIS (ARGON PLASMA COAGULATION/BICAP);  Surgeon: Tressia Danas, MD;  Location: [facility] ENDOSCOPY;  Service: Gastroenterology;  Laterality: N/A;   LYMPHADENECTOMY Bilateral 02/27/2014   Procedure: BILATERAL LYMPHADENECTOMY;  Surgeon: Sebastian Ache, MD;  [facility];  Service: Urology;  Laterality: Bilateral;   POLYPECTOMY  05/23/2020   Procedure: POLYPECTOMY;  Surgeon: Lynann Bologna, MD;  [facility];  Service: Gastroenterology;;   PROSTATE BIOPSY  12/2013   Gleason 4+3=7,  volume 31.31 cc   ROBOT ASSISTED LAPAROSCOPIC RADICAL PROSTATECTOMY N/A 02/27/2014   Procedure: ROBOTIC ASSISTED LAPAROSCOPIC RADICAL PROSTATECTOMY WITH INDOCYANINE GREEN DYE;  Surgeon: Sebastian Ache, MD;  [facility];  Service: Urology;  Laterality: N/A;    Vitals:   01/21/21 0850 01/21/21 0910  BP: (!) 143/68 (!) 115/58     Subjective Assessment - 01/21/21 0911     Subjective  Denies lightheadness/dizziness today    Pertinent History CVA x 2 (Jan and Feb 2022), recent syncopal seizure 10/05/20, DM w/ diabetic retinopathy, HTN, HLD, GERD    Limitations NO Driving, **recent seizure    Currently in Pain? No/denies             Pt making scrambled egg with mod to max cueing for safety/fall prevention (to hold onto countertop w/ one hand or lean hips into counter when 2 hands required), to plan ahead to get items needed, and to turn off stove. Pt also needed close supervision to CGA for safety as bilateral LE's seemed weak.  BP was stable prior to task, but checked again immediately after task and it dropped - see vitals section.  Seated: Pt placed O'Conner pegs in pegboard w/ RT hand, occasional assist from Lt hand to manipulate peg for placement; then removed w/ Lt hand using tweezers.   (P.T. has already sent note to MD re: pt's BP, however also will discuss w/ family today at end of all therapy sessions if family available)                        OT Short Term Goals - 01/14/21 0907       OT SHORT TERM GOAL #1   Title Independent with initial HEP for bilateral coordination and Rt grip strength    Time 5    Period Weeks    Status Achieved      OT SHORT TERM GOAL #2   Title Pt/family to verbalize acquisition of tub bench to prevent falls    Time 5    Period Weeks    Status Achieved      OT SHORT TERM GOAL #3   Title Pt to consistently perform snack prep and heat up microwaveable items    Time 5    Period Weeks    Status Achieved   cues for  safety/fall prevention     OT SHORT TERM GOAL #4   Title Pt to wash dishes and fold towels consistently    Time 5    Period Weeks    Status On-going   washed 2 dishes in clinic on 01/07/21 w/ sup     OT SHORT TERM GOAL #5   Title Pt to improve coordination bilaterally by 10 sec    Baseline Rt = 1 min, Lt = 47 sec    Time 5    Period Weeks    Status Partially Met   met on Rt = 47.34 sec, not met on Lt = 40.66 sec (but improved on Lt)  OT Long Term Goals - 01/14/21 0909       OT LONG TERM GOAL #1   Title Independent with UE HEP for strengthening    Time 10    Period Weeks    Status New      OT LONG TERM GOAL #2   Title Pt to improve Rt grip strength by 5 lbs or greater    Baseline 51 lbs (Lt = 60 lbs)    Time 10    Period Weeks    Status Achieved   Rt = 70.1 lbs, Lt = 71.8 lbs     OT LONG TERM GOAL #3   Title Pt to perform simple cooking task (egg) with distant supervision    Time 10    Period Weeks    Status New      OT LONG TERM GOAL #4   Title Pt to consistently perform light IADLS (all cleaning) at mod I level    Time 10    Period Weeks    Status New      OT LONG TERM GOAL #5   Title Pt to perform simple financial management with direct supervision    Time 10    Period Weeks    Status New                   Plan - 01/21/21 0917     Clinical Impression Statement Pt requires mod to max cueing and close supervision to CGA for simple cooking task today    OT Occupational Profile and History Detailed Assessment- Review of Records and additional review of physical, cognitive, psychosocial history related to current functional performance    Occupational performance deficits (Please refer to evaluation for details): IADL's;ADL's;Social Participation    Body Structure / Function / Physical Skills Strength;Dexterity;UE functional use;IADL;Endurance;Mobility;Coordination;FMC;Vision    Cognitive Skills Problem Solve;Sequencing;Perception;Learn     Rehab Potential Good    Clinical Decision Making Several treatment options, min-mod task modification necessary    Comorbidities Affecting Occupational Performance: Presence of comorbidities impacting occupational performance    Comorbidities impacting occupational performance description: seizure, diabetic retinopathy    Modification or Assistance to Complete Evaluation  No modification of tasks or assist necessary to complete eval    OT Frequency 2x / week    OT Duration --   10 weeks (anticipate only 6-8 weeks)   OT Treatment/Interventions Self-care/ADL training;DME and/or AE instruction;Therapeutic activities;Therapeutic exercise;Cognitive remediation/compensation;Coping strategies training;Visual/perceptual remediation/compensation;Functional Mobility Training;Neuromuscular education;Manual Therapy;Patient/family education    Plan continue to monitor BP as BP drops in standing, standing tasks for simple IADLS if able    Consulted and Agree with Plan of Care Patient;Family member/caregiver    Family Member Consulted niece [name])             Patient will benefit from skilled therapeutic intervention in order to improve the following deficits and impairments:   Body Structure / Function / Physical Skills: Strength, Dexterity, UE functional use, IADL, Endurance, Mobility, Coordination, FMC, Vision Cognitive Skills: Problem Solve, Sequencing, Perception, Learn     Visit Diagnosis: Unsteadiness on feet  Other symptoms and signs involving cognitive functions following cerebral infarction  Muscle weakness (generalized)  Other lack of coordination    Problem List Patient Active Problem List   Diagnosis Date Noted   Hospital discharge follow-up 12/24/2020   Weakness of both legs    Adenomatous duodenal polyp    AVM (arteriovenous malformation) of duodenum, acquired with hemorrhage    Need for hepatitis C  screening test 11/10/2020   Pneumococcal vaccination administered at  current visit 11/10/2020   Healthcare maintenance 11/06/2020   Hypoglycemia 04/79/9872   Acute metabolic encephalopathy 15/87/2761   Acute on chronic anemia 10/22/2020   Angiodysplasia of intestine    GI bleed 10/05/2020   Melena    Upper GI bleed    Iron deficiency anemia 05/22/2020   Constipation 05/22/2020   GIB (gastrointestinal bleeding) 05/21/2020   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 ([place])    History of CVA (cerebrovascular accident) 04/23/2020   Hyperlipidemia associated with type 2 diabetes mellitus ([place]) 04/23/2020   Alcohol use 04/23/2020   AKI (acute kidney injury) ([place]) 04/23/2020   Tobacco use 04/23/2020   Unresponsive 11/08/2018   Syncopal seizure ([place]) 11/08/2018   Hypertension associated with diabetes ([place]) 11/08/2018   Alcohol withdrawal seizure with complication, with unspecified complication ([place]) 84/85/9276   Type 2 diabetes mellitus without complication ([place]) 39/43/2003   Symptomatic anemia 04/15/2018   Prostate cancer ([place]) 02/27/2014   Malignant neoplasm of prostate ([place]) 01/09/2014   Bunion 01/17/2013   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    Carey Bullocks, OTR/L 01/21/2021, 9:20 AM  [facility] [address] [place], [place], [address] Phone: [phone]   Fax:  [phone]  Name: Gregory Nunez. MRN: [id number] Date of Birth: [birth year]

## 2021-01-21 NOTE — Therapy (Signed)
[facility] [address] [place], [place], [address] Phone: [phone]   Fax:  [phone]  Physical Therapy Treatment/Re-Cert/10th Visit Progress Note  Patient Details  Name: Gregory Nunez. MRN: [id number] Date of Birth: 1945-05-24 Referring Provider (PT): Velna Ochs, MD  10th Visit Physical Therapy Progress Note  Dates of Reporting Period: 11/25/20 to 01/22/21   Encounter Date: 01/21/2021   PT End of Session - 01/21/21 1059     Visit Number 9    Number of Visits 26    Date for PT Re-Evaluation 04/22/21    Authorization Type [employer] Medicare    Progress Note Due on Visit 19   10th visit PN performed on visit 9   PT Start Time 1017    PT Stop Time 1057    PT Time Calculation (min) 40 min    Equipment Utilized During Treatment Gait belt    Activity Tolerance Patient tolerated treatment well    Behavior During Therapy WFL for tasks assessed/performed             Past Medical History:  Diagnosis Date   CAO (chronic airflow obstruction) (HCC)    Cataract    OD   CVA (cerebral vascular accident) ([place])    Depression    Diabetes mellitus without complication ([place])    Diabetic retinopathy ([place])    NPDR OU   GERD (gastroesophageal reflux disease)    if drinks alcohol   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   Patient is approximately 5-week status post bunion correction left foot   Hyperlipidemia    Hypertension    Hypertensive retinopathy    OU   Malignant neoplasm of prostate ([place]) 01/09/2014   Neuropathy    Pancreatitis    Prostate cancer ([place]) 12/19/2013   Gleason 4+3=7, volume 31.31 cc   Sciatica    Shortness of breath dyspnea    with exertion     Past Surgical History:  Procedure Laterality Date   BIOPSY  04/16/2018   Procedure: BIOPSY;  Surgeon: Juanita Craver, MD;  Location: [facility];  Service: Endoscopy;;   BIOPSY  05/23/2020   Procedure: BIOPSY;  Surgeon: Jackquline Denmark, MD;   Location: [facility];  Service: Gastroenterology;;  EGD and COLON   BIOPSY  12/09/2020   Procedure: BIOPSY;  Surgeon: Thornton Park, MD;  Location: [facility] ENDOSCOPY;  Service: Gastroenterology;;   biopsy on throat     hx of    CATARACT EXTRACTION Bilateral    Dr. Quentin Ore   COLONOSCOPY N/A 05/23/2020   Procedure: COLONOSCOPY;  Surgeon: Jackquline Denmark, MD;  Location: [facility];  Service: Gastroenterology;  Laterality: N/A;   ENTEROSCOPY N/A 10/07/2020   Procedure: ENTEROSCOPY;  Surgeon: [name], MD;  Location: [facility] ENDOSCOPY;  Service: Endoscopy;  Laterality: N/A;   ESOPHAGOGASTRODUODENOSCOPY Left 04/16/2018   Procedure: ESOPHAGOGASTRODUODENOSCOPY (EGD);  Surgeon: Juanita Craver, MD;  Location: [facility];  Service: Endoscopy;  Laterality: Left;   ESOPHAGOGASTRODUODENOSCOPY (EGD) WITH PROPOFOL N/A 05/23/2020   Procedure: ESOPHAGOGASTRODUODENOSCOPY (EGD) WITH PROPOFOL;  Surgeon: Jackquline Denmark, MD;  Location: [facility];  Service: Gastroenterology;  Laterality: N/A;   ESOPHAGOGASTRODUODENOSCOPY (EGD) WITH PROPOFOL N/A 12/09/2020   Procedure: ESOPHAGOGASTRODUODENOSCOPY (EGD) WITH PROPOFOL;  Surgeon: Thornton Park, MD;  Location: [facility];  Service: Gastroenterology;  Laterality: N/A;   EYE SURGERY     FOOT SURGERY     HOT HEMOSTASIS N/A 04/16/2018   Procedure: HOT HEMOSTASIS (ARGON PLASMA COAGULATION/BICAP);  Surgeon: Juanita Craver, MD;  Location: [facility];  Service: Endoscopy;  Laterality: N/A;   HOT HEMOSTASIS N/A 05/23/2020   Procedure: HOT HEMOSTASIS (ARGON PLASMA COAGULATION/BICAP);  Surgeon: Jackquline Denmark, MD;  Location: [facility];  Service: Gastroenterology;  Laterality: N/A;   HOT HEMOSTASIS N/A 12/09/2020   Procedure: HOT HEMOSTASIS (ARGON PLASMA COAGULATION/BICAP);  Surgeon: Thornton Park, MD;  Location: [facility];  Service: Gastroenterology;  Laterality: N/A;   LYMPHADENECTOMY Bilateral 02/27/2014   Procedure: BILATERAL LYMPHADENECTOMY;  Surgeon: Alexis Frock, MD;  Location: [facility];  Service: Urology;  Laterality: Bilateral;   POLYPECTOMY  05/23/2020   Procedure: POLYPECTOMY;  Surgeon: Jackquline Denmark, MD;  Location: [facility];  Service: Gastroenterology;;   PROSTATE BIOPSY  12/2013   Gleason 4+3=7, volume 31.31 cc   ROBOT ASSISTED LAPAROSCOPIC RADICAL PROSTATECTOMY N/A 02/27/2014   Procedure: ROBOTIC ASSISTED LAPAROSCOPIC RADICAL PROSTATECTOMY WITH INDOCYANINE GREEN DYE;  Surgeon: Alexis Frock, MD;  Location: [facility];  Service: Urology;  Laterality: N/A;    Vitals:   01/21/21 1024 01/21/21 1025  BP: 122/66 (!) 116/58     Subjective Assessment - 01/21/21 1020     Subjective Reports that today is a good day. Reports no dizziness today.    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 No/denies                [facility] PT Assessment - 01/21/21 1021       Assessment   Medical Diagnosis CVA    Referring Provider (PT) Velna Ochs, MD    Hand Dominance Right      Transfers   Sit to Stand 5: Supervision    Five time sit to stand comments  19.6 seconds without hands from mat table    Stand to Sit 5: Supervision      Ambulation/Gait   Ambulation/Gait Yes    Ambulation Distance (Feet) 115 Feet   plus additional distances   Assistive device Straight cane    Gait Pattern Step-through pattern;Narrow base of support    Ambulation Surface Level;Indoor    Gait velocity 12.10 seconds = 2.71 ft/sec   with cane     Standardized Balance Assessment   Standardized Balance Assessment Timed Up and Go Test      Timed Up and Go Test   Normal TUG (seconds) 12.75   no AD     High Level Balance   High Level Balance Comments mCTSIB: condition 1: 30 seconds, condition 2 and 3 = 30 seconds with  mild postural sway.  condition 4= 4 seconds                           [facility] Adult PT Treatment/Exercise - 01/21/21 1021       Ambulation/Gait   Ambulation/Gait Assistance 5: Supervision;4: Min guard    Ambulation/Gait Assistance Details Cues for cane placement. Pt with more narrow BOS, esp going around turns, needing one episode of min guard    Gait Comments Discussed pt using his cane at all times due to imbalance/fall risk and pt sometimes with sudden episodes of feeling lighthead/dizziness during gait due to orthostatics      Therapeutic Activites    Therapeutic Activities Other Therapeutic Activities    Other Therapeutic Activities Had pt's sister come back at  end of session - discussed pt's orthostatics has been limiting his ability to participate in therapy (sees his PCP this week) and to make sure that pt is being slow with transitions and staying hydrated. Discussed with pt's sister use of cane at all time due to LOB and these orthostatic episodes. Discussed seeing if family member can be with pt and provide supervision while pt is doing his PT exercises at home as pt has not been doing them consistently for incr carryover at home to continue work on pt's balance.           Access Code: [email or web address] URL: [email or web address] Date: 01/21/2021 Prepared by: Janann August  Reviewed pt's HEP at the countertop. Pt has his exercises in his binder. Does need cues for proper technique and using the countertop with UE support as needed.   Exercises Walking March - 2 x daily - 7 x weekly - 1 sets - 3-4 reps Side Stepping with Counter Support - 2 x daily - 7 x weekly - 1 sets - 3-4 reps Tandem Walking with Counter Support - 2 x daily - 7 x weekly - 1 sets - 3-4 reps Standing Single Leg Stance with Counter Support - 2 x daily - 7 x weekly - 1 sets - 3 reps - 20sec hold           PT Education - 01/22/21 0823     Education Details See therapeutic  activity section. Reviewed HEP with pt (questionable compliance due to cognition?)    Person(s) Educated Patient   pt's sister   Methods Explanation;Demonstration    Comprehension Verbalized understanding;Returned demonstration              PT Short Term Goals - 12/25/20 0856       PT SHORT TERM GOAL #1   Title Pt will be independent with initial HEP in order to build up functional gains made in therapy. ALL STGS DUE 12/23/20    Baseline 12/25/20 PT had not started HEP due to health issues prior to hospital stay    Time 4    Period Weeks    Status Not Met    Target Date 12/23/20      PT SHORT TERM GOAL #2   Title Pt will decr 5x sit <> stand time to 18 seconds or less in order to demo improved functional BLE strength.    Baseline 20.88 seconds without UE support. 12/25/20 5 x sit to stand=16.17 sec without hands    Time 4    Period Weeks    Status Achieved      PT SHORT TERM GOAL #3   Title Pt will improve gait speed with no AD to at least 3.3 ft/sec in order to demo improved gait efficiency.    Baseline 2.93 ft/sec. 12/25/20 2.26ft/sec    Time 4    Period Weeks    Status Not Met      PT SHORT TERM GOAL #4   Title Pt and pt's family will verbalize understanding of fall prevention in the home in order to demo decr fall risk.    Baseline 12/25/20 PT educated on fall prevention strategies around home and pt and his neice verbalized understanding.    Time 4    Period Weeks    Status Achieved      PT SHORT TERM GOAL #5   Title Pt will improve FGA score to at least a 19/30 in order to demo decr fall risk.    Baseline  16/30. 12/25/20 FGA=18/30    Time 4    Period Weeks    Status Partially Met            Revised STGs:  PT Short Term Goals - 01/22/21 0842       PT SHORT TERM GOAL #1   Title Pt will improve FGA score to at least a 20/30 in order to demo decr fall risk. ALL STGS DUE 02/19/21    Baseline 16/30. 12/25/20 FGA=18/30    Time 4    Period Weeks    Status Revised     Target Date 02/19/21      PT SHORT TERM GOAL #2   Title Pt will decr 5x sit <> stand time to 18 seconds or less in order to demo improved functional BLE strength.    Baseline 19.6 seconds without hands on 01/22/21    Time 4    Period Weeks    Status Revised      PT SHORT TERM GOAL #3   Title Pt will improve gait speed with cane to at least 2.9 ft/sec in order to demo improved gait efficiency.    Baseline 2.71 ft/sec with SPC    Time 4    Period Weeks    Status Revised                PT Long Term Goals - 01/21/21 1029       PT LONG TERM GOAL #1   Title Pt will be independent with final HEP in order to build up functional gains made in therapy. ALL LTGS DUE 01/20/21    Baseline reviewed HEP, questionable compliance, pt has not been performing with family assist at home.    Time 8    Period Weeks    Status Not Met      PT LONG TERM GOAL #2   Title Pt will decr 5x sit <> stand time to 16 seconds or less in order to demo improved functional BLE strength.    Baseline 20.88 seconds without UE support, 19.7 seconds on 01/21/21    Time 8    Period Weeks    Status Not Met      PT LONG TERM GOAL #3   Title Pt will ambulate at least 300' outdoors over unlevel surfaces with supervision in order to demo improved community mobility.    Time 8    Period Weeks    Status Deferred      PT LONG TERM GOAL #4   Title Pt will improve FGA score to at least a 22/30 in order to demo decr fall risk.    Baseline 16/30    Time 8    Period Weeks    Status Deferred      PT LONG TERM GOAL #5   Title Pt will improve condition 4 of mCTSIB to at least 20 seconds in order to demo improved vestibular input for balance.    Baseline 7 seconds, 4 seconds on 01/21/21    Time 8    Period Weeks    Status Not Met            Revised/ongoing LTGs:  PT Long Term Goals - 01/22/21 0846       PT LONG TERM GOAL #1   Title Pt will be independent with final HEP in order to build up functional gains  made in therapy. ALL LTGS DUE  03/19/21    Baseline reviewed HEP, questionable compliance, pt has not been performing  with family assist at home.    Time 8    Period Weeks    Status On-going    Target Date 03/19/21      PT LONG TERM GOAL #2   Title Pt will decr 5x sit <> stand time to 15 seconds or less in order to demo improved functional BLE strength.    Baseline 19.7 seconds on 01/21/21    Time 8    Period Weeks    Status Revised      PT LONG TERM GOAL #3   Title Pt will ambulate at least 300' outdoors over unlevel surfaces with supervision in order to demo improved community mobility.    Time 8    Period Weeks    Status On-going      PT LONG TERM GOAL #4   Title Pt will improve FGA score to at least a 22/30 in order to demo decr fall risk.    Baseline 18/30    Time 8    Period Weeks    Status On-going      PT LONG TERM GOAL #5   Title Pt will improve condition 4 of mCTSIB to at least 15 seconds in order to demo improved vestibular input for balance.    Baseline 7 seconds, 4 seconds on 01/21/21    Time 8    Period Weeks    Status On-going                    Plan - 01/22/21 0834     Clinical Impression Statement 10th visit progress note: Pt reporting having a better day today with no dizziness/lightheadedness throughout session today and BP more WNL. Pt has previously been limited in some PT sessions due to orthostatics - going to see his PCP this week regarding this. Checked pt's LTGs for re-cert. Pt did not meet LTG #1, 2, and 5. Pt has not been consistently performing his HEP at home. Discussed with sister at end of session importance of having family provide supervision with pt for home for incr carryover. Pt taking incr time with 5x sit <> stand today in 19.7 seconds (previously was 16.17 seconds without hands), putting pt at an incr fall risk. Pt only able to hold condition 4 of mCTSIB for 4 seconds, indicating decr vestibular input for balance. Pt able to hold  conditions 2 and 3 for 30 seconds, but with incr postural sway. Deferred LTGs #3 and #4 due to time constraints. Discussed use of cane at all times during gait for safety due to orthostatics and pt with imbalance, esp going around curves and having more narrow BOS at times. Will re-cert for an additional 2x week for 8 weeks to continue to work on strength, balance, gait, transfers, in order to improve functional mobility and decr pt's fall risk. STGs/LTGs updated as appropriate.    Personal Factors and Comorbidities Comorbidity 3+;Past/Current Experience;Time since onset of injury/illness/exacerbation    Comorbidities CVA, alcohol use, diabetes, hyperlipidemia, hypertension, iron deficiency anemia, depression, GERD, prostate cancer    Examination-Activity Limitations Stairs;Transfers;Locomotion Level    Examination-Participation Restrictions Community Activity;Driving    Stability/Clinical Decision Making Stable/Uncomplicated    Rehab Potential Good    PT Frequency 2x / week    PT Duration 12 weeks    PT Treatment/Interventions ADLs/Self Care Home Management;DME Instruction;Gait training;Stair training;Therapeutic activities;Functional mobility training;Therapeutic exercise;Balance training;Patient/family education;Neuromuscular re-education;Vestibular    PT Next Visit Plan How did visit with PCP go? Add to HEP-corner balance. Noted losing  balance to right when standing on compliant surfaces. Check BP. Continue with balance training and gait on varied surfaces. BLE strengthening, SLS tasks. try Scifit/Nustep for strength/activity tolerance    PT Home Exercise Plan Access Code [email or web address]    Consulted and Agree with Plan of Care Patient;Family member/caregiver    Family Member Consulted Sister             Patient will benefit from skilled therapeutic intervention in order to improve the following deficits and impairments:  Abnormal gait, Decreased balance, Decreased strength, Impaired sensation,  Postural dysfunction, Difficulty walking, Decreased activity tolerance  Visit Diagnosis: Other lack of coordination  Unsteadiness on feet  Muscle weakness (generalized)  Other abnormalities of gait and mobility     Problem List Patient Active Problem List   Diagnosis Date Noted   Hospital discharge follow-up 12/24/2020   Weakness of both legs    Adenomatous duodenal polyp    AVM (arteriovenous malformation) of duodenum, acquired with hemorrhage    Need for hepatitis C screening test 11/10/2020   Pneumococcal vaccination administered at current visit 11/10/2020   Healthcare maintenance 11/06/2020   Hypoglycemia 84/06/3531   Acute metabolic encephalopathy 17/40/9927   Acute on chronic anemia 10/22/2020   Angiodysplasia of intestine    GI bleed 10/05/2020   Melena    Upper GI bleed    Iron deficiency anemia 05/22/2020   Constipation 05/22/2020   GIB (gastrointestinal bleeding) 05/21/2020   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 ([place])    History of CVA (cerebrovascular accident) 04/23/2020   Hyperlipidemia associated with type 2 diabetes mellitus ([place]) 04/23/2020   Alcohol use 04/23/2020   AKI (acute kidney injury) ([place]) 04/23/2020   Tobacco use 04/23/2020   Unresponsive 11/08/2018   Syncopal seizure ([place]) 11/08/2018   Hypertension associated with diabetes ([place]) 11/08/2018   Alcohol withdrawal seizure with complication, with unspecified complication ([place]) 80/07/4713   Type 2 diabetes mellitus without complication ([place]) 80/63/8685   Symptomatic anemia 04/15/2018   Prostate cancer ([place]) 02/27/2014   Malignant neoplasm of prostate ([place]) 01/09/2014   Bunion 01/17/2013   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    Arliss Journey, PT, DPT  01/22/2021, 8:41 AM  [facility] [address] [place], [place], [address] Phone: [phone]   Fax:  [phone]  Name: Gregory Nunez. MRN: [id number] Date of Birth: 05/16/1945

## 2021-01-21 NOTE — Therapy (Signed)
[facility] [address] [place], [place], [address] Phone: [phone]   Fax:  [phone]  Speech Language Pathology Treatment  Patient Details  Name: Gregory Nunez. MRN: [id number] Date of Birth: [birth year] Referring Provider (SLP): Dr. Velna Ochs   Encounter Date: 01/21/2021   End of Session - 01/21/21 1213     Visit Number 12    Number of Visits 25    Date for SLP Re-Evaluation 02/09/21             Past Medical History:  Diagnosis Date   CAO (chronic airflow obstruction) ([facility])    Cataract    OD   CVA (cerebral vascular accident) ([facility])    Depression    Diabetes mellitus without complication ([facility])    Diabetic retinopathy ([facility])    NPDR OU   GERD (gastroesophageal reflux disease)    if drinks alcohol   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   Patient is approximately 5-week status post bunion correction left foot   Hyperlipidemia    Hypertension    Hypertensive retinopathy    OU   Malignant neoplasm of prostate ([facility]) 01/09/2014   Neuropathy    Pancreatitis    Prostate cancer ([facility]) 12/19/2013   Gleason 4+3=7, volume 31.31 cc   Sciatica    Shortness of breath dyspnea    with exertion     Past Surgical History:  Procedure Laterality Date   BIOPSY  04/16/2018   Procedure: BIOPSY;  Surgeon: Juanita Craver, MD;  Location: [facility];  Service: Endoscopy;;   BIOPSY  05/23/2020   Procedure: BIOPSY;  Surgeon: Jackquline Denmark, MD;  Location: [facility];  Service: Gastroenterology;;  EGD and COLON   BIOPSY  12/09/2020   Procedure: BIOPSY;  Surgeon: Thornton Park, MD;  Location: [facility] ENDOSCOPY;  Service: Gastroenterology;;   biopsy on throat     hx of    CATARACT EXTRACTION Bilateral    Dr. Quentin Ore   COLONOSCOPY N/A 05/23/2020   Procedure: COLONOSCOPY;  Surgeon: Jackquline Denmark, MD;  Location: [facility];  Service: Gastroenterology;  Laterality: N/A;   ENTEROSCOPY N/A 10/07/2020    Procedure: ENTEROSCOPY;  Surgeon: [name], MD;  Location: [facility] ENDOSCOPY;  Service: Endoscopy;  Laterality: N/A;   ESOPHAGOGASTRODUODENOSCOPY Left 04/16/2018   Procedure: ESOPHAGOGASTRODUODENOSCOPY (EGD);  Surgeon: Juanita Craver, MD;  Location: [facility];  Service: Endoscopy;  Laterality: Left;   ESOPHAGOGASTRODUODENOSCOPY (EGD) WITH PROPOFOL N/A 05/23/2020   Procedure: ESOPHAGOGASTRODUODENOSCOPY (EGD) WITH PROPOFOL;  Surgeon: Jackquline Denmark, MD;  [facility]: [facility];  Service: Gastroenterology;  Laterality: N/A;   ESOPHAGOGASTRODUODENOSCOPY (EGD) WITH PROPOFOL N/A 12/09/2020   Procedure: ESOPHAGOGASTRODUODENOSCOPY (EGD) WITH PROPOFOL;  Surgeon: Thornton Park, MD;  Location: [facility];  Service: Gastroenterology;  Laterality: N/A;   EYE SURGERY     FOOT SURGERY     HOT HEMOSTASIS N/A 04/16/2018   Procedure: HOT HEMOSTASIS (ARGON PLASMA COAGULATION/BICAP);  Surgeon: Juanita Craver, MD;  Location: [facility];  Service: Endoscopy;  Laterality: N/A;   HOT HEMOSTASIS N/A 05/23/2020   Procedure: HOT HEMOSTASIS (ARGON PLASMA COAGULATION/BICAP);  Surgeon: Jackquline Denmark, MD;  Location: [facility];  Service: Gastroenterology;  Laterality: N/A;   HOT HEMOSTASIS N/A 12/09/2020   Procedure: HOT HEMOSTASIS (ARGON PLASMA COAGULATION/BICAP);  Surgeon: Thornton Park, MD;  Location: [facility];  Service: Gastroenterology;  Laterality: N/A;   LYMPHADENECTOMY Bilateral 02/27/2014   Procedure: BILATERAL LYMPHADENECTOMY;  Surgeon: Alexis Frock, MD;  Location: [facility];  Service: Urology;  Laterality: Bilateral;   POLYPECTOMY  05/23/2020   Procedure: POLYPECTOMY;  Surgeon: Jackquline Denmark, MD;  Location: [facility];  Service: Gastroenterology;;   PROSTATE BIOPSY  12/2013   Gleason 4+3=7, volume 31.31 cc   ROBOT ASSISTED LAPAROSCOPIC RADICAL PROSTATECTOMY N/A 02/27/2014   Procedure: ROBOTIC ASSISTED LAPAROSCOPIC RADICAL PROSTATECTOMY WITH INDOCYANINE GREEN DYE;  Surgeon: Alexis Frock, MD;  Location:  [facility];  Service: Urology;  Laterality: N/A;    There were no vitals filed for this visit.   Subjective Assessment - 01/21/21 0939     Subjective "The other lady wants to talk to [name], I'm not getting done as much as I should"    Currently in Pain? No/denies                   ADULT SLP TREATMENT - 01/21/21 0940       General Information   Behavior/Cognition Alert;Cooperative;Pleasant mood;Requires cueing      Treatment Provided   Treatment provided Cognitive-Linquistic      Cognitive-Linquistic Treatment   Treatment focused on Cognition;Aphasia;Patient/family/caregiver education    Skilled Treatment [name] did not complete HW, we discussed who at home could help him remember to sit down and work on [employer] at least 1x daily. He ID'd his sister, Gregory Nunez - left sticky note on his binder to ask Gregory Nunez for help. Pt required extended time and occasional semantic cues for 4 word finding episodes in 5 mintue conversatoin. Word finding and sentence generation targete with Pharmacist, hospital ([name]) With consistent extended time and frequent mod semantic and questioning cues, Aven generated 3 subect and objects for the verbs "mix, weigh, and pass." He generated 1 complex sentence for each verb my answering 3 "wh" questions with usual extended time and rare min A for word finding.      Assessment / Recommendations / Plan   Plan Continue with current plan of care      Progression Toward Goals   Progression toward goals Not progressing toward goals (comment)   poor carryover at home               SLP Short Term Goals - 01/21/21 1213       SLP SHORT TERM GOAL #1   Title Pt will use external aids to recall am meds with 3 or less reminders from family over 1 week    Baseline family remidning his consistently every day    Time 1    Period Weeks    Status Not Met      SLP SHORT TERM GOAL #2   Title Gregory Nunez will carryover 2 compensations for memory and attention  to complete 3 house hold chores a week with occasiona min A from family    Baseline not completing house hold chores    Time 1    Period Weeks    Status Not Met      SLP SHORT TERM GOAL #3   Title 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 18/20 sentences with occasional min A    Time 1    Period Weeks    Status Not Met      SLP SHORT TERM GOAL #4   Title Pt will name 10 items for personally relevant category using verbal compensations as needed with occasional min A over 2 sessions    Time 1    Period Weeks    Status Not Met      SLP SHORT TERM GOAL #5   Title Pt will fill out  2 money orders to pay bills with occasional min A from family    Baseline Nephew is paying his bills for him    Time 1    Period Weeks    Status Not Met              SLP Long Term Goals - 01/21/21 1213       SLP LONG TERM GOAL #1   Title Pt will use external aids and compensations to manage his medications, appointments and daily to do list with occasional min A from family    Baseline Family managing meds and appointments    Time 4    Period Weeks    Status On-going      SLP LONG TERM GOAL #2   Title 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 over 15 minute conversation to be 95% intelligible with occasional min A    Time 4    Period Weeks    Status On-going      SLP LONG TERM GOAL #3   Title Pt will ID aphasic errors in simple conversation with 3 or less verbal cues and attempt to self correct errors    Baseline Pt unaware of errors    Time 4    Period Weeks    Status On-going      SLP LONG TERM GOAL #4   Title Pt will use organization system, calendar to pay 3 bills on time with occasional min A from family.    Baseline family managing his finances    Time 4    Period Weeks    Status On-going      SLP LONG TERM GOAL #5   Title Family will rate Trea' communciation on [employer] Participation [employer] General Short Form a 12 or higher     Baseline rated a score of 9    Time 4    Period Weeks    Status On-going      SLP LONG TERM GOAL #6   Title Pt will carryover 3 strategies for attention and memory to cook3  simple meals with supervision from family    Baseline not cooking    Time 5    Period Weeks    Status Achieved              Plan - 01/21/21 1212     Clinical Impression Statement Mild to moderate aphasia improving. Moderate cognitive impairment persists affecting safety, decision making, sequencing. Targeted 4 tasks he would need to do to live indepenently, ID'ing safety concers and solutions with frequent mod A. Word finding , spelling, error awareness targeted generating list of foods he would need to upon return home with consistent max A for error awareness. [place] continues to required supervision from family for IADL's, but has improved partcipation in house hold chores of dishes, changing his sheets. [place] skilled ST to maximize communication and cognition for possible return home independently which is pt's goal.    Speech Therapy Frequency 2x / week    Duration 12 weeks    Treatment/Interventions Environmental controls;Cueing hierarchy;Compensatory strategies;Functional tasks;Cognitive reorganization;Compensatory techniques;Patient/family education;Multimodal communcation approach;Internal/external aids    Potential to Achieve Goals Fair    Potential Considerations Co-morbidities;Previous level of function;Cooperation/participation level             Patient will benefit from skilled therapeutic intervention in order to improve the following deficits and impairments:   Cognitive communication deficit    Problem List Patient Active Problem List   Diagnosis  Date Noted   Hospital discharge follow-up 12/24/2020   Weakness of both legs    Adenomatous duodenal polyp    AVM (arteriovenous malformation) of duodenum, acquired with hemorrhage    Need for hepatitis C screening test 11/10/2020    Pneumococcal vaccination administered at current visit 11/10/2020   Healthcare maintenance 11/06/2020   Hypoglycemia 09/81/1914   Acute metabolic encephalopathy 78/29/5621   Acute on chronic anemia 10/22/2020   Angiodysplasia of intestine    GI bleed 10/05/2020   Melena    Upper GI bleed    Iron deficiency anemia 05/22/2020   Constipation 05/22/2020   GIB (gastrointestinal bleeding) 05/21/2020   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 ([place])    History of CVA (cerebrovascular accident) 04/23/2020   Hyperlipidemia associated with type 2 diabetes mellitus ([place]) 04/23/2020   Alcohol use 04/23/2020   AKI (acute kidney injury) ([place]) 04/23/2020   Tobacco use 04/23/2020   Unresponsive 11/08/2018   Syncopal seizure ([place]) 11/08/2018   Hypertension associated with diabetes ([place]) 11/08/2018   Alcohol withdrawal seizure with complication, with unspecified complication ([place]) 30/86/5784   Type 2 diabetes mellitus without complication ([place]) 69/62/9528   Symptomatic anemia 04/15/2018   Prostate cancer ([place]) 02/27/2014   Malignant neoplasm of prostate ([place]) 01/09/2014   Bunion 01/17/2013   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    [place] Catarino, [place], CCC-SLP 01/21/2021, 12:13 PM  [facility] [address] [place], [place], [address] Phone: [phone]   Fax:  [phone]   Name: Gregory Nunez. MRN: [id number] Date of Birth: 05/01/1945

## 2021-01-23 ENCOUNTER — Ambulatory Visit (INDEPENDENT_AMBULATORY_CARE_PROVIDER_SITE_OTHER): Payer: Medicare Other | Admitting: Student

## 2021-01-23 ENCOUNTER — Emergency Department (HOSPITAL_COMMUNITY)
Admission: EM | Admit: 2021-01-23 | Discharge: 2021-01-24 | Discharge status: Against Medical Advice | Payer: Medicare Other | Attending: Emergency Medicine | Admitting: Emergency Medicine

## 2021-01-23 ENCOUNTER — Other Ambulatory Visit: Payer: Self-pay

## 2021-01-23 ENCOUNTER — Encounter (HOSPITAL_COMMUNITY): Payer: Self-pay | Admitting: *Deleted

## 2021-01-23 ENCOUNTER — Encounter: Payer: Self-pay | Admitting: Student

## 2021-01-23 VITALS — BP 131/63 | HR 98 | Temp 98.3°F | Resp 28 | Ht 71.0 in | Wt 166.9 lb

## 2021-01-23 DIAGNOSIS — E1159 Type 2 diabetes mellitus with other circulatory complications: Secondary | ICD-10-CM | POA: Diagnosis not present

## 2021-01-23 DIAGNOSIS — N1831 Chronic kidney disease, stage 3a: Secondary | ICD-10-CM

## 2021-01-23 DIAGNOSIS — D649 Anemia, unspecified: Secondary | ICD-10-CM | POA: Insufficient documentation

## 2021-01-23 DIAGNOSIS — R29898 Other symptoms and signs involving the musculoskeletal system: Secondary | ICD-10-CM

## 2021-01-23 DIAGNOSIS — F0390 Unspecified dementia without behavioral disturbance: Secondary | ICD-10-CM | POA: Diagnosis not present

## 2021-01-23 DIAGNOSIS — N183 Chronic kidney disease, stage 3 unspecified: Secondary | ICD-10-CM | POA: Diagnosis not present

## 2021-01-23 DIAGNOSIS — I129 Hypertensive chronic kidney disease with stage 1 through stage 4 chronic kidney disease, or unspecified chronic kidney disease: Secondary | ICD-10-CM | POA: Diagnosis not present

## 2021-01-23 DIAGNOSIS — Z7984 Long term (current) use of oral hypoglycemic drugs: Secondary | ICD-10-CM | POA: Insufficient documentation

## 2021-01-23 DIAGNOSIS — E119 Type 2 diabetes mellitus without complications: Secondary | ICD-10-CM | POA: Diagnosis not present

## 2021-01-23 DIAGNOSIS — Z79899 Other long term (current) drug therapy: Secondary | ICD-10-CM | POA: Insufficient documentation

## 2021-01-23 DIAGNOSIS — E11319 Type 2 diabetes mellitus with unspecified diabetic retinopathy without macular edema: Secondary | ICD-10-CM | POA: Insufficient documentation

## 2021-01-23 DIAGNOSIS — F17210 Nicotine dependence, cigarettes, uncomplicated: Secondary | ICD-10-CM | POA: Insufficient documentation

## 2021-01-23 DIAGNOSIS — Z8546 Personal history of malignant neoplasm of prostate: Secondary | ICD-10-CM | POA: Insufficient documentation

## 2021-01-23 DIAGNOSIS — Z7982 Long term (current) use of aspirin: Secondary | ICD-10-CM | POA: Insufficient documentation

## 2021-01-23 DIAGNOSIS — Z5321 Procedure and treatment not carried out due to patient leaving prior to being seen by health care provider: Secondary | ICD-10-CM | POA: Insufficient documentation

## 2021-01-23 DIAGNOSIS — I152 Hypertension secondary to endocrine disorders: Secondary | ICD-10-CM

## 2021-01-23 LAB — IRON AND TIBC
Iron: 208 ug/dL — ABNORMAL HIGH (ref 45–182)
Saturation Ratios: 45 % — ABNORMAL HIGH (ref 17.9–39.5)
TIBC: 459 ug/dL — ABNORMAL HIGH (ref 250–450)
UIBC: 251 ug/dL

## 2021-01-23 LAB — CBC
HCT: 19.2 % — ABNORMAL LOW (ref 39.0–52.0)
Hemoglobin: 6 g/dL — CL (ref 13.0–17.0)
MCH: 32.3 pg (ref 26.0–34.0)
MCHC: 31.3 g/dL (ref 30.0–36.0)
MCV: 103.2 fL — ABNORMAL HIGH (ref 80.0–100.0)
Platelets: 200 10*3/uL (ref 150–400)
RBC: 1.86 MIL/uL — ABNORMAL LOW (ref 4.22–5.81)
RDW: 17.4 % — ABNORMAL HIGH (ref 11.5–15.5)
WBC: 3.4 10*3/uL — ABNORMAL LOW (ref 4.0–10.5)
nRBC: 0 % (ref 0.0–0.2)

## 2021-01-23 LAB — RETICULOCYTES
Immature Retic Fract: 26.9 % — ABNORMAL HIGH (ref 2.3–15.9)
RBC.: 1.87 MIL/uL — ABNORMAL LOW (ref 4.22–5.81)
Retic Count, Absolute: 118.6 10*3/uL (ref 19.0–186.0)
Retic Ct Pct: 6.3 % — ABNORMAL HIGH (ref 0.4–3.1)

## 2021-01-23 LAB — CBC WITH DIFFERENTIAL/PLATELET
Abs Immature Granulocytes: 0.02 10*3/uL (ref 0.00–0.07)
Basophils Absolute: 0 10*3/uL (ref 0.0–0.1)
Basophils Relative: 0 %
Eosinophils Absolute: 0.1 10*3/uL (ref 0.0–0.5)
Eosinophils Relative: 2 %
HCT: 21 % — ABNORMAL LOW (ref 39.0–52.0)
Hemoglobin: 6.1 g/dL — CL (ref 13.0–17.0)
Immature Granulocytes: 1 %
Lymphocytes Relative: 22 %
Lymphs Abs: 0.8 10*3/uL (ref 0.7–4.0)
MCH: 31.8 pg (ref 26.0–34.0)
MCHC: 29 g/dL — ABNORMAL LOW (ref 30.0–36.0)
MCV: 109.4 fL — ABNORMAL HIGH (ref 80.0–100.0)
Monocytes Absolute: 0.3 10*3/uL (ref 0.1–1.0)
Monocytes Relative: 9 %
Neutro Abs: 2.3 10*3/uL (ref 1.7–7.7)
Neutrophils Relative %: 66 %
Platelets: 201 10*3/uL (ref 150–400)
RBC: 1.92 MIL/uL — ABNORMAL LOW (ref 4.22–5.81)
RDW: 17.7 % — ABNORMAL HIGH (ref 11.5–15.5)
WBC: 3.4 10*3/uL — ABNORMAL LOW (ref 4.0–10.5)
nRBC: 0 % (ref 0.0–0.2)

## 2021-01-23 LAB — HEMOGLOBIN A1C
Hgb A1c MFr Bld: 4.3 % — ABNORMAL LOW (ref 4.8–5.6)
Mean Plasma Glucose: 76.71 mg/dL

## 2021-01-23 LAB — BASIC METABOLIC PANEL
Anion gap: 9 (ref 5–15)
Anion gap: 7 (ref 5–15)
BUN: 24 mg/dL — ABNORMAL HIGH (ref 8–23)
BUN: 25 mg/dL — ABNORMAL HIGH (ref 8–23)
CO2: 23 mmol/L (ref 22–32)
CO2: 23 mmol/L (ref 22–32)
Calcium: 8.9 mg/dL (ref 8.9–10.3)
Calcium: 8.8 mg/dL — ABNORMAL LOW (ref 8.9–10.3)
Chloride: 106 mmol/L (ref 98–111)
Chloride: 107 mmol/L (ref 98–111)
Creatinine, Ser: 1.6 mg/dL — ABNORMAL HIGH (ref 0.61–1.24)
Creatinine, Ser: 1.68 mg/dL — ABNORMAL HIGH (ref 0.61–1.24)
GFR, Estimated: 45 mL/min — ABNORMAL LOW (ref >60)
GFR, Estimated: 43 mL/min — ABNORMAL LOW (ref >60)
Glucose, Bld: 189 mg/dL — ABNORMAL HIGH (ref 70–99)
Glucose, Bld: 197 mg/dL — ABNORMAL HIGH (ref 70–99)
Potassium: 3.9 mmol/L (ref 3.5–5.1)
Potassium: 3.9 mmol/L (ref 3.5–5.1)
Sodium: 138 mmol/L (ref 135–145)
Sodium: 137 mmol/L (ref 135–145)

## 2021-01-23 LAB — VITAMIN B12: Vitamin B-12: 757 pg/mL (ref 180–914)

## 2021-01-23 LAB — TYPE AND SCREEN
ABO/RH(D): O POS
Antibody Screen: NEGATIVE

## 2021-01-23 LAB — FOLATE: Folate: 18 ng/mL (ref >5.9)

## 2021-01-23 LAB — FERRITIN: Ferritin: 38 ng/mL (ref 24–336)

## 2021-01-23 NOTE — Patient Instructions (Signed)
[name],  It was a pleasure seeing you in the clinic today.   Your blood pressure looks good today.  Please make sure to drink an adequate amount of water in the day. Your urine should be clear to light yellow in appearance. Please make sure to eat some food an hour or so before your physical therapy to have more energy. Please call our clinic and let us know which medications you are taking. If you are taking Losartan and Hyzaar (Losartan-Hydrochlorothiazide), make sure to STOP the Hyzaar.  Please call our clinic at [phone] if you have any questions or concerns. The best time to call is Monday-Friday from 9am-4pm, but there is someone available 24/7 at the same number. If you need medication refills, please notify your pharmacy one week in advance and they will send [place] a request.   Thank you for letting us take part in your care. We look forward to seeing you next time!

## 2021-01-23 NOTE — ED Provider Notes (Signed)
Emergency Medicine Provider Triage Evaluation Note  Gregory Nunez. , a 75 y.o. male  was evaluated in triage.  Pt complains of low blood count.  Review of Systems  Positive: Low blood count, dark color stool Negative: Lightheadedness, cp, sob, abd pain  Physical Exam  BP 122/60 (BP Location: Left Arm)   Pulse 92   Temp 98.7 F (37.1 C) (Oral)   Resp 18   SpO2 100%  Gen:   Awake, no distress   Resp:  Normal effort  MSK:   Moves extremities without difficulty  Other:  Pale in appearance  Medical Decision Making  Medically screening exam initiated at 3:45 PM.  Appropriate orders placed.  Gregory Nunez. was informed that the remainder of the evaluation will be completed by another provider, this initial triage assessment does not replace that evaluation, and the importance of remaining in the ED until their evaluation is complete.  Pt went to PCP for a regular check up, had blood work done and was called today due to low hemoglobin of 6.9.  Does have hx of alcohol abuse, and report noticing dark stool for the past week.     Domenic Moras, PA-C 01/23/21 1547    Regan Lemming, MD 01/23/21 1925

## 2021-01-23 NOTE — Assessment & Plan Note (Signed)
Patient's CBC showing a hemoglobin of 6.0. He is currently asymptomatic from it, but he has been generally feeling more weakness which may be attributable to his anemia.  I have advised for him to go to the ED for further evaluation and for a blood transfusion. He confirms understanding and will go today.  Plan: -go to ED for transfusion and further evaluation

## 2021-01-23 NOTE — Assessment & Plan Note (Signed)
Patient reports continued weakness in his legs after working with physical therapy. Initially, concern for orthostatic hypotension which is why patient was told to come to [facility] for further evaluation.  Orthostatic VS for the past 24 hrs:  BP- Lying Pulse- Lying BP- Sitting Pulse- Sitting BP- Standing at 0 minutes Pulse- Standing at 0 minutes  01/23/21 1108 130/65 93 133/61 96 125/52 106    However, patient's orthostatic vitals no consistent with significant orthostasis and patient denies any symptoms of dizziness/lightheadedness with standing. It is possible that patient's BP may have dropped if he is taking both cozaar and hyzaar along with his norvasc. Will need to determine this.  Otherwise, patient reports that he drinks some water during the day. Urine is typically not light yellow but also not dark yellow. Explained to drink adequate water to maintain urine between clear and light yellow. Also advised patient to consume a meal about an hour or so prior to working with physical therapy to get some more energy. He confirms understanding.  Will avoid more aggressive testing at this time seeing how orthostatics were negative and he denies any orthostatic symptoms. BP also looks great today, so not very concerned for low BP.  Plan: -adequate hydration -consume meal prior to PT

## 2021-01-23 NOTE — Assessment & Plan Note (Signed)
Patient with history of T2DM, on metformin 500mg  daily. He is due for repeat A1c testing, last A1c of 6.2% 3 months ago.   Plan: -f/u A1c -continue metformin 500mg  daily

## 2021-01-23 NOTE — Assessment & Plan Note (Addendum)
Vitals:   01/23/21 1056  BP: 131/63   Patient with history of HTN, on norvasc 10mg  daily and cozaar 100mg  daily. Of note, patient's medication list also includes hyzaar 100-12.5mg  daily. They are unsure at this time on whether he is taking cozaar, hyzaar, or both. I advised them to recheck their medication list once home and make sure he is not on both. If he is, then to stop taking the hyzaar at this time and continue with cozaar alone. They will call the [facility] once they determine this.  Will check BMP to assess kidney function since hyzaar was added on 01/05/2021 (unsure if taking).   Plan: -continue norvasc and cozaar -stop hyzaar if taking -f/u BMP  ADDENDUM: Patient was taking both cozaar and hyzaar. I advised him to stop his hyzaar and to only continue cozaar at this time given recent soft blood pressures (see Dr. Olevia Perches note from 12/08/20). If BP becomes uncontrolled on cozaar alone in the future, can consider switching to hyzaar.

## 2021-01-23 NOTE — Assessment & Plan Note (Addendum)
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 Will recheck BMP today. Should he progress to CKD IIIB/IV, consider nephrology referral for further management.

## 2021-01-23 NOTE — Addendum Note (Signed)
Addended by: Truddie Crumble on: 01/23/2021 12:20 PM   Modules accepted: Orders

## 2021-01-23 NOTE — ED Triage Notes (Signed)
The pt saw his doctor this am for a check up  he was called at home and told that his blood was abnormal  and to come to the hospital

## 2021-01-23 NOTE — Progress Notes (Signed)
   CC: weakness after working with PT  HPI:  [name] is a 75 y.o. male with history listed below presenting to the [facility] for weakness after working with physical therapy. Please see individualized problem based charting for full HPI.  Past Medical History:  Diagnosis Date  . CAO (chronic airflow obstruction) ([facility])   . Cataract    OD  . CVA (cerebral vascular accident) ([place])   . Depression   . Diabetes mellitus without complication ([place])   . Diabetic retinopathy ([place])    NPDR OU  . GERD (gastroesophageal reflux disease)    if drinks alcohol  . 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 Hyperlipidemia   . Hypertension   . Hypertensive retinopathy    OU  . Malignant neoplasm of prostate ([place]) 01/09/2014  . Neuropathy   . Pancreatitis   . Prostate cancer ([place]) 12/19/2013   Gleason 4+3=7, volume 31.31 cc  . Sciatica   . Shortness of breath dyspnea    with exertion     Review of Systems:  Negative aside from that listed in individualized problem based charting.  Physical Exam:  Vitals:   01/23/21 1056  BP: 131/63  Pulse: 98  Resp: (!) 28  Temp: 98.3 F (36.8 C)  TempSrc: Oral  SpO2: 100%  Weight: 166 lb 14.4 oz (75.7 kg)  Height: 5\' 11"  (1.803 m)   Physical Exam Constitutional:      Appearance: Normal appearance. He is not ill-appearing.  HENT:     Mouth/Throat:     Mouth: Mucous membranes are moist.     Pharynx: Oropharynx is clear.  Eyes:     Extraocular Movements: Extraocular movements intact.     Conjunctiva/sclera: Conjunctivae normal.     Pupils: Pupils are equal, round, and reactive to light.  Cardiovascular:     Rate and Rhythm: Normal rate and regular rhythm.     Pulses: Normal pulses.     Heart sounds: Normal heart sounds. No murmur heard.   No friction rub. No gallop.  Pulmonary:     Effort: Pulmonary effort is normal.     Breath sounds: Normal breath  sounds. No wheezing, rhonchi or rales.  Abdominal:     General: Bowel sounds are normal. There is no distension.     Palpations: Abdomen is soft.     Tenderness: There is no abdominal tenderness.  Musculoskeletal:        General: Normal range of motion.     Cervical back: Normal range of motion.  Skin:    General: Skin is warm and dry.  Neurological:     General: No focal deficit present.     Mental Status: He is alert and oriented to person, place, and time.  Psychiatric:        Mood and Affect: Mood normal.        Behavior: Behavior normal.     Assessment & Plan:   See Encounters Tab for problem based charting.  Patient discussed with Dr. Dareen Piano

## 2021-01-23 NOTE — ED Notes (Signed)
Pt stated he was leaving to another hospital AMA, not receptive to encouragement to stay

## 2021-01-24 ENCOUNTER — Other Ambulatory Visit: Payer: Self-pay

## 2021-01-24 ENCOUNTER — Encounter (HOSPITAL_COMMUNITY): Payer: Self-pay | Admitting: Emergency Medicine

## 2021-01-24 ENCOUNTER — Emergency Department (HOSPITAL_COMMUNITY)
Admission: EM | Admit: 2021-01-24 | Discharge: 2021-01-24 | Disposition: A | Discharge status: Home / Self Care | Payer: Medicare Other | Source: Home / Self Care | Attending: Emergency Medicine | Admitting: Emergency Medicine

## 2021-01-24 DIAGNOSIS — F0390 Unspecified dementia without behavioral disturbance: Secondary | ICD-10-CM | POA: Insufficient documentation

## 2021-01-24 DIAGNOSIS — F17210 Nicotine dependence, cigarettes, uncomplicated: Secondary | ICD-10-CM | POA: Insufficient documentation

## 2021-01-24 DIAGNOSIS — I129 Hypertensive chronic kidney disease with stage 1 through stage 4 chronic kidney disease, or unspecified chronic kidney disease: Secondary | ICD-10-CM | POA: Insufficient documentation

## 2021-01-24 DIAGNOSIS — E11319 Type 2 diabetes mellitus with unspecified diabetic retinopathy without macular edema: Secondary | ICD-10-CM | POA: Insufficient documentation

## 2021-01-24 DIAGNOSIS — D649 Anemia, unspecified: Secondary | ICD-10-CM | POA: Insufficient documentation

## 2021-01-24 DIAGNOSIS — Z8546 Personal history of malignant neoplasm of prostate: Secondary | ICD-10-CM | POA: Insufficient documentation

## 2021-01-24 DIAGNOSIS — Z7982 Long term (current) use of aspirin: Secondary | ICD-10-CM | POA: Insufficient documentation

## 2021-01-24 DIAGNOSIS — Z79899 Other long term (current) drug therapy: Secondary | ICD-10-CM | POA: Insufficient documentation

## 2021-01-24 DIAGNOSIS — N183 Chronic kidney disease, stage 3 unspecified: Secondary | ICD-10-CM | POA: Insufficient documentation

## 2021-01-24 DIAGNOSIS — Z7984 Long term (current) use of oral hypoglycemic drugs: Secondary | ICD-10-CM | POA: Insufficient documentation

## 2021-01-24 LAB — COMPREHENSIVE METABOLIC PANEL
ALT: 32 U/L (ref 0–44)
AST: 36 U/L (ref 15–41)
Albumin: 3.4 g/dL — ABNORMAL LOW (ref 3.5–5.0)
Alkaline Phosphatase: 49 U/L (ref 38–126)
Anion gap: 6 (ref 5–15)
BUN: 30 mg/dL — ABNORMAL HIGH (ref 8–23)
CO2: 25 mmol/L (ref 22–32)
Calcium: 8.8 mg/dL — ABNORMAL LOW (ref 8.9–10.3)
Chloride: 108 mmol/L (ref 98–111)
Creatinine, Ser: 1.55 mg/dL — ABNORMAL HIGH (ref 0.61–1.24)
GFR, Estimated: 47 mL/min — ABNORMAL LOW (ref >60)
Glucose, Bld: 128 mg/dL — ABNORMAL HIGH (ref 70–99)
Potassium: 4.1 mmol/L (ref 3.5–5.1)
Sodium: 139 mmol/L (ref 135–145)
Total Bilirubin: 0.6 mg/dL (ref 0.3–1.2)
Total Protein: 7.1 g/dL (ref 6.5–8.1)

## 2021-01-24 LAB — CBC
HCT: 19.4 % — ABNORMAL LOW (ref 39.0–52.0)
Hemoglobin: 6 g/dL — CL (ref 13.0–17.0)
MCH: 31.9 pg (ref 26.0–34.0)
MCHC: 30.9 g/dL (ref 30.0–36.0)
MCV: 103.2 fL — ABNORMAL HIGH (ref 80.0–100.0)
Platelets: 186 10*3/uL (ref 150–400)
RBC: 1.88 MIL/uL — ABNORMAL LOW (ref 4.22–5.81)
RDW: 17.2 % — ABNORMAL HIGH (ref 11.5–15.5)
WBC: 3.2 10*3/uL — ABNORMAL LOW (ref 4.0–10.5)
nRBC: 0 % (ref 0.0–0.2)

## 2021-01-24 LAB — PREPARE RBC (CROSSMATCH)

## 2021-01-24 LAB — POC OCCULT BLOOD, ED: Fecal Occult Bld: NEGATIVE

## 2021-01-24 MED ORDER — SODIUM CHLORIDE 0.9 % IV SOLN
10.0000 mL/h | Freq: Once | INTRAVENOUS | Status: AC
  Administered 2021-01-24: 10 mL/h via INTRAVENOUS

## 2021-01-24 NOTE — Discharge Instructions (Signed)
You have been seen and discharged from the emergency department.  Your hemoglobin was noted to be low at 6, you were transfused 1 unit.  The rest of your labs were unremarkable and normal for you.  Follow-up with your primary provider for reevaluation and further care. Take home medications as prescribed. If you have any worsening symptoms or further concerns for your health please return to an emergency department for further evaluation.

## 2021-01-24 NOTE — ED Provider Notes (Signed)
[facility] DEPT Provider Note   CSN: [id number] Arrival date & time: 01/24/21  4098     History Chief Complaint  Patient presents with   Abnormal Lab    Gregory Nunez. is a 75 y.o. male.  HPI  75 year old male with past medical history of previous CVA, HTN, HLD, DM, dementia, chronic anemia, previous GI bleed presents to the emergency department with concern for low blood counts.  Patient states he was seen at his primary doctor and had outpatient blood work done.  He was called for concern of low hemoglobin and encouraged to come in.  He does endorse generalized fatigue and weakness.  No acute chest pain or shortness of breath.  Admits to an episode of lightheadedness yesterday.  Originally checked into [facility] yesterday but did not wait to be seen.  Believes he may have had a couple episodes of dark black stool.  Past Medical History:  Diagnosis Date   CAO (chronic airflow obstruction) (HCC)    Cataract    OD   CVA (cerebral vascular accident) ([place])    Depression    Diabetes mellitus without complication ([place])    Diabetic retinopathy ([place])    NPDR OU   GERD (gastroesophageal reflux disease)    if drinks alcohol   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   Patient is approximately 5-week status post bunion correction left foot   Hyperlipidemia    Hypertension    Hypertensive retinopathy    OU   Malignant neoplasm of prostate ([place]) 01/09/2014   Neuropathy    Pancreatitis    Pneumococcal vaccination administered at current visit 11/10/2020   Prostate cancer ([place]) 12/19/2013   Gleason 4+3=7, volume 31.31 cc   Sciatica    Shortness of breath dyspnea    with exertion     Patient Active Problem List   Diagnosis Date Noted   Hospital discharge follow-up 12/24/2020   Weakness of both legs    Adenomatous duodenal polyp    AVM (arteriovenous malformation) of duodenum, acquired with hemorrhage    Need for hepatitis C screening test  11/10/2020   Healthcare maintenance 11/06/2020   Hypoglycemia 11/91/4782   Acute metabolic encephalopathy 95/62/1308   Acute on chronic anemia 10/22/2020   Angiodysplasia of intestine    GI bleed 10/05/2020   Iron deficiency anemia 05/22/2020   Constipation 05/22/2020   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 ([place])    History of CVA (cerebrovascular accident) 04/23/2020   Hyperlipidemia associated with type 2 diabetes mellitus ([place]) 04/23/2020   Alcohol use 04/23/2020   CKD (chronic kidney disease) stage 3, GFR 30-59 ml/min ([place]) 04/23/2020   Tobacco use 04/23/2020   Syncopal seizure ([place]) 11/08/2018   Hypertension associated with diabetes ([place]) 11/08/2018   Alcohol withdrawal seizure with complication, with unspecified complication ([place]) 65/78/4696   Type 2 diabetes mellitus without complication ([place]) 29/52/8413   Symptomatic anemia 04/15/2018   Prostate cancer ([place]) 02/27/2014   Bunion 01/17/2013   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    Past Surgical History:  Procedure Laterality Date   BIOPSY  04/16/2018   Procedure: BIOPSY;  Surgeon: Juanita Craver, MD;  Location: [facility] ENDOSCOPY;  Service: Endoscopy;;   BIOPSY  05/23/2020   Procedure: BIOPSY;  Surgeon: Jackquline Denmark, MD;  Location: [facility] ENDOSCOPY;  Service: Gastroenterology;;  EGD and COLON   BIOPSY  12/09/2020   Procedure: BIOPSY;  Surgeon: Thornton Park, MD;  Location: [facility]COPY;  Service: Gastroenterology;;   biopsy on  throat     hx of    CATARACT EXTRACTION Bilateral    Dr. Quentin Ore   COLONOSCOPY N/A 05/23/2020   Procedure: COLONOSCOPY;  Surgeon: Jackquline Denmark, MD;  Location: [facility]OSCOPY;  Service: Gastroenterology;  Laterality: N/A;   ENTEROSCOPY N/A 10/07/2020   Procedure: ENTEROSCOPY;  Surgeon: [name], MD;  Location: [facility] ENDOSCOPY;  Service: Endoscopy;  Laterality: N/A;   ESOPHAGOGASTRODUODENOSCOPY Left 04/16/2018   Procedure: ESOPHAGOGASTRODUODENOSCOPY (EGD);  Surgeon: Juanita Craver, MD;  Location: [facility];  Service: Endoscopy;  Laterality: Left;   ESOPHAGOGASTRODUODENOSCOPY (EGD) WITH PROPOFOL N/A 05/23/2020   Procedure: ESOPHAGOGASTRODUODENOSCOPY (EGD) WITH PROPOFOL;  Surgeon: Jackquline Denmark, MD;  Location: [facility];  Service: Gastroenterology;  Laterality: N/A;   ESOPHAGOGASTRODUODENOSCOPY (EGD) WITH PROPOFOL N/A 12/09/2020   Procedure: ESOPHAGOGASTRODUODENOSCOPY (EGD) WITH PROPOFOL;  Surgeon: Thornton Park, MD;  Location: [facility];  Service: Gastroenterology;  Laterality: N/A;   EYE SURGERY     FOOT SURGERY     HOT HEMOSTASIS N/A 04/16/2018   Procedure: HOT HEMOSTASIS (ARGON PLASMA COAGULATION/BICAP);  Surgeon: Juanita Craver, MD;  Location: [facility];  Service: Endoscopy;  Laterality: N/A;   HOT HEMOSTASIS N/A 05/23/2020   Procedure: HOT HEMOSTASIS (ARGON PLASMA COAGULATION/BICAP);  Surgeon: Jackquline Denmark, MD;  Location: [facility];  Service: Gastroenterology;  Laterality: N/A;   HOT HEMOSTASIS N/A 12/09/2020   Procedure: HOT HEMOSTASIS (ARGON PLASMA COAGULATION/BICAP);  Surgeon: Thornton Park, MD;  Location: [facility];  Service: Gastroenterology;  Laterality: N/A;   LYMPHADENECTOMY Bilateral 02/27/2014   Procedure: BILATERAL LYMPHADENECTOMY;  Surgeon: Alexis Frock, MD;  Location: [facility];  Service: Urology;  Laterality: Bilateral;   POLYPECTOMY  05/23/2020   Procedure: POLYPECTOMY;  Surgeon: Jackquline Denmark, MD;  Location: [facility];  Service: Gastroenterology;;   PROSTATE BIOPSY  12/2013   Gleason 4+3=7, volume 31.31 cc   ROBOT ASSISTED LAPAROSCOPIC RADICAL PROSTATECTOMY N/A 02/27/2014   Procedure: ROBOTIC ASSISTED LAPAROSCOPIC RADICAL PROSTATECTOMY WITH INDOCYANINE GREEN DYE;  Surgeon: Alexis Frock, MD;  Location: [facility];  Service: Urology;  Laterality: N/A;       Family History  Problem Relation Age of Onset   Heart disease Mother    Heart attack Father 70   Cancer Sister        breast   Colon cancer Neg Hx    Esophageal  cancer Neg Hx    Rectal cancer Neg Hx    Stomach cancer Neg Hx     Social History   Tobacco Use   Smoking status: Every Day    Packs/day: 0.50    Types: Cigarettes   Smokeless tobacco: Never   Tobacco comments:    1 ppd Wants Patches   Vaping Use   Vaping Use: Never used  Substance Use Topics   Alcohol use: Yes    Alcohol/week: 1.0 standard drink    Types: 1 Cans of beer per week   Drug use: Not Currently    Types: Marijuana, Cocaine    Comment: past hx approx 30 years ago     Home Medications Prior to Admission medications   Medication Sig Start Date End Date Taking? Authorizing Provider  amLODipine (NORVASC) 10 MG tablet Take 1 tablet (10 mg total) by mouth daily. 11/06/20   Farrel Gordon, DO  aspirin EC 81 MG EC tablet Take 1 tablet (81 mg total) by mouth daily. Swallow whole. 10/24/20 10/24/21  Jennye Boroughs, MD  atorvastatin (LIPITOR) 40 MG tablet Take 1 tablet (40 mg total) by mouth daily. 10/08/20 01/06/21  Gevena Barre  K, MD  docusate sodium (COLACE) 250 MG capsule Take 1 capsule (250 mg total) by mouth daily. Patient not taking: Reported on 01/05/2021 12/26/20   Delene Ruffini, MD  iron polysaccharides (NU-IRON) 150 MG capsule Take 1 capsule (150 mg total) by mouth daily. Patient not taking: No sig reported 10/24/20   Jennye Boroughs, MD  levETIRAcetam (KEPPRA) 500 MG tablet Take 1 tablet (500 mg total) by mouth 2 (two) times daily. 01/05/21   Penumalli, Earlean Polka, MD  losartan (COZAAR) 100 MG tablet Take 100 mg by mouth daily.    [provider]  losartan-hydrochlorothiazide (HYZAAR) 100-12.5 MG tablet Take 1 tablet by mouth daily.    [provider]  metFORMIN (GLUCOPHAGE) 500 MG tablet Take 500 mg by mouth 2 (two) times daily. 02/13/19   [provider]  pantoprazole (PROTONIX) 40 MG tablet Take 1 tablet (40 mg total) by mouth daily. 11/06/20   Farrel Gordon, DO  COMBIVENT RESPIMAT 20-100 MCG/ACT AERS respimat Inhale 1 puff into the lungs every 6  (six) hours as needed for shortness of breath. 06/12/18 08/19/19  [provider]  mometasone-formoterol (DULERA) 100-5 MCG/ACT AERO Inhale 2 puffs into the lungs daily. Patient not taking: Reported on 08/19/2019 04/17/18 08/19/19  Dana Allan I, MD  sucralfate (CARAFATE) 1 g tablet Take 1 tablet (1 g total) by mouth 4 (four) times daily. Patient not taking: Reported on 08/19/2019 04/17/18 08/19/19  Dana Allan I, MD  tiotropium (SPIRIVA HANDIHALER) 18 MCG inhalation capsule Place 1 capsule (18 mcg total) into inhaler and inhale daily. Patient not taking: Reported on 08/19/2019 04/17/18 08/19/19  Dana Allan I, MD  traZODone (DESYREL) 50 MG tablet Take 50 mg by mouth at bedtime as needed for sleep. Patient not taking: Reported on 10/06/2020  10/08/20  [provider]    Allergies    Penicillins  Review of Systems   Review of Systems  Constitutional:  Positive for fatigue. Negative for chills and fever.  HENT:  Negative for congestion.   Eyes:  Negative for visual disturbance.  Respiratory:  Negative for shortness of breath.   Cardiovascular:  Negative for chest pain and leg swelling.  Gastrointestinal:  Negative for abdominal pain, diarrhea and vomiting.       Dark stools  Genitourinary:  Negative for dysuria.  Musculoskeletal:  Negative for back pain.  Skin:  Negative for rash.  Neurological:  Positive for weakness. Negative for syncope and headaches.   Physical Exam Updated Vital Signs BP (!) 154/73   Pulse 93   Temp 97.9 F (36.6 C) (Oral)   Resp (!) 21   SpO2 100%   Physical Exam Vitals and nursing note reviewed.  Constitutional:      General: He is not in acute distress.    Appearance: Normal appearance.  HENT:     Head: Normocephalic.     Mouth/Throat:     Mouth: Mucous membranes are moist.  Cardiovascular:     Rate and Rhythm: Normal rate.  Pulmonary:     Effort: Pulmonary effort is normal. No respiratory distress.  Abdominal:      Palpations: Abdomen is soft.     Tenderness: There is no abdominal tenderness. There is no guarding or rebound.  Skin:    General: Skin is warm.  Neurological:     Mental Status: He is alert and oriented to person, place, and time. Mental status is at baseline.  Psychiatric:        Mood and Affect: Mood normal.  ED Results / Procedures / Treatments   Labs (all labs ordered are listed, but only abnormal results are displayed) Labs Reviewed  COMPREHENSIVE METABOLIC PANEL - Abnormal; Notable for the following components:      Result Value   Glucose, Bld 128 (*)    BUN 30 (*)    Creatinine, Ser 1.55 (*)    Calcium 8.8 (*)    Albumin 3.4 (*)    GFR, Estimated 47 (*)    All other components within normal limits  CBC - Abnormal; Notable for the following components:   WBC 3.2 (*)    RBC 1.88 (*)    Hemoglobin 6.0 (*)    HCT 19.4 (*)    MCV 103.2 (*)    RDW 17.2 (*)    All other components within normal limits  OCCULT BLOOD X 1 CARD TO LAB, STOOL  TYPE AND SCREEN  PREPARE RBC (CROSSMATCH)    EKG None  Radiology No results found.  Procedures .Critical Care Performed by: Lorelle Gibbs, DO Authorized by: Lorelle Gibbs, DO   Critical care provider statement:    Critical care time (minutes):  45   Critical care time was exclusive of:  Separately billable procedures and treating other patients   Critical care was necessary to treat or prevent imminent or life-threatening deterioration of the following conditions:  Metabolic crisis   Critical care was time spent personally by me on the following activities:  Discussions with consultants, evaluation of patient's response to treatment, examination of patient, ordering and performing treatments and interventions, ordering and review of laboratory studies, ordering and review of radiographic studies, pulse oximetry, re-evaluation of patient's condition, obtaining history from patient or surrogate and review of old charts    Medications Ordered in ED Medications  0.9 %  sodium chloride infusion (10 mL/hr Intravenous New Bag/Given 01/24/21 1104)    ED Course  I have reviewed the triage vital signs and the nursing notes.  Pertinent labs & imaging results that were available during my care of the patient were reviewed by me and considered in my medical decision making (see chart for details).    MDM Rules/Calculators/A&P                           75 year old male presents emergency department with concern for drop in his hemoglobin on outpatient blood work.  Patient is asymptomatic in regards to the anemia.  Not on anticoagulation, mentioned one dark bowel movement the other day.  Vitals are stable here, he is not orthostatic.  He is comfortable, abdomen is benign.  Fecal occult is negative.  Hemoglobin is down to 6.0 from a baseline of 7.0. CKD is baseline, patient is otherwise well appearing with benign abdomen. Plan for transfusion and outpatient follow up. Patient agrees, niece will be updated on plan and take him home.   Final Clinical Impression(s) / ED Diagnoses Final diagnoses:  None    Rx / DC Orders ED Discharge Orders     None        Lorelle Gibbs, DO 01/24/21 1503

## 2021-01-24 NOTE — ED Triage Notes (Signed)
Per EMS, patient from home, labs drawn yesterday at PCP, hemoglobin found to be 6.0. Hx of same last month with blood transfusion. Patient c/o dizziness. A&Ox3. Hx dementia. Ambulatory with EMS. No orthostatic changes with EMS.

## 2021-01-26 ENCOUNTER — Ambulatory Visit: Payer: Medicare Other | Admitting: Occupational Therapy

## 2021-01-26 ENCOUNTER — Ambulatory Visit: Payer: Medicare Other | Admitting: Physical Therapy

## 2021-01-26 ENCOUNTER — Ambulatory Visit: Payer: Medicare Other

## 2021-01-26 LAB — TYPE AND SCREEN
ABO/RH(D): O POS
Antibody Screen: NEGATIVE
Unit division: 0

## 2021-01-26 LAB — BPAM RBC
Blood Product Expiration Date: 202211072359
ISSUE DATE / TIME: 202210081307
Unit Type and Rh: 5100

## 2021-01-26 NOTE — Progress Notes (Signed)
Internal Medicine Clinic Attending  Case discussed with Dr. Jinwala  At the time of the visit.  We reviewed the resident's history and exam and pertinent patient test results.  I agree with the assessment, diagnosis, and plan of care documented in the resident's note.  

## 2021-01-27 ENCOUNTER — Ambulatory Visit: Payer: Medicare Other | Admitting: Gastroenterology

## 2021-01-28 ENCOUNTER — Ambulatory Visit: Payer: Medicare Other

## 2021-01-28 ENCOUNTER — Ambulatory Visit: Payer: Medicare Other | Admitting: Occupational Therapy

## 2021-01-28 ENCOUNTER — Ambulatory Visit: Payer: Medicare Other | Admitting: Speech Pathology

## 2021-02-02 ENCOUNTER — Ambulatory Visit: Payer: Medicare Other | Admitting: Speech Pathology

## 2021-02-02 ENCOUNTER — Ambulatory Visit: Payer: Medicare Other

## 2021-02-02 ENCOUNTER — Ambulatory Visit: Payer: Medicare Other | Admitting: Occupational Therapy

## 2021-02-02 NOTE — Therapy (Signed)
[facility] [address] [place], [place], [address] Phone: [phone]   Fax:  [phone]  Patient Details  Name: Gregory Nunez. MRN: [id number] Date of Birth: [birth year] Referring Provider:  No ref. provider found  Encounter Date: 02/02/2021  PHYSICAL THERAPY DISCHARGE SUMMARY/ Non visit discharge summary  Visits from Start of Care: 9  Current functional level related to goals / functional outcomes: Pt was ambulating with cane at supervision/CGA level. Pt has been limited due to low Bps and anemia. Pt cancelled remaining visits stating that he is not up to therapy at this time. Below are goals from last visit.     Remaining deficits: Balance deficits, decreased strength, decreased activity tolerance   Education / Equipment: HEP   Patient agrees to discharge. Patient goals were not met. Patient is being discharged due to the patient's request. PT LONG TERM GOAL #1     Title Pt will be independent with final HEP in order to build up functional gains made in therapy. ALL LTGS DUE 01/20/21     Baseline reviewed HEP, questionable compliance, pt has not been performing with family assist at home.     Time 8     Period Weeks     Status Not Met          PT LONG TERM GOAL #2    Title Pt will decr 5x sit <> stand time to 16 seconds or less in order to demo improved functional BLE strength.     Baseline 20.88 seconds without UE support, 19.7 seconds on 01/21/21     Time 8     Period Weeks     Status Not Met          PT LONG TERM GOAL #3    Title Pt will ambulate at least 300' outdoors over unlevel surfaces with supervision in order to demo improved community mobility.     Time 8     Period Weeks     Status Deferred          PT LONG TERM GOAL #4    Title Pt will improve FGA score to at least a 22/30 in order to demo decr fall risk.     Baseline 16/30     Time 8     Period Weeks     Status Deferred           PT LONG TERM GOAL #5    Title Pt will improve condition 4 of mCTSIB to at least 20 seconds in order to demo improved vestibular input for balance.     Baseline 7 seconds, 4 seconds on 01/21/21     Time 8     Period Weeks     Status Not Met    Electa Sniff, PT, DPT, NCS 02/02/2021, 4:42 PM  [facility] [address] [place], [place], [address] Phone: [phone]   Fax:  [phone]

## 2021-02-03 NOTE — Therapy (Signed)
[facility] [address] [place], [place], [address] Phone: [phone]   Fax:  [phone]  Patient Details  Name: Gregory Nunez. MRN: [id number] Date of Birth: October 06, 1945 Referring Provider:  No ref. provider found  Encounter Date: 02/03/2021  OCCUPATIONAL THERAPY DISCHARGE SUMMARY  Visits from Start of Care: 8  Current functional level related to goals / functional outcomes:  OT Short Term Goals - 01/14/21 0907       OT SHORT TERM GOAL #1   Title Independent with initial HEP for bilateral coordination and Rt grip strength    Time 5    Period Weeks    Status Achieved      OT SHORT TERM GOAL #2   Title Pt/family to verbalize acquisition of tub bench to prevent falls    Time 5    Period Weeks    Status Achieved      OT SHORT TERM GOAL #3   Title Pt to consistently perform snack prep and heat up microwaveable items    Time 5    Period Weeks    Status Achieved   cues for safety/fall prevention     OT SHORT TERM GOAL #4   Title Pt to wash dishes and fold towels consistently    Time 5    Period Weeks    Status On-going   washed 2 dishes in clinic on 01/07/21 w/ sup     OT SHORT TERM GOAL #5   Title Pt to improve coordination bilaterally by 10 sec    Baseline Rt = 1 min, Lt = 47 sec    Time 5    Period Weeks    Status Partially Met   met on Rt = 47.34 sec, not met on Lt = 40.66 sec (but improved on Lt)            OT Long Term Goals - 01/14/21 0909       OT LONG TERM GOAL #1   Title Independent with UE HEP for strengthening    Time 10    Period Weeks    Status New      OT LONG TERM GOAL #2   Title Pt to improve Rt grip strength by 5 lbs or greater    Baseline 51 lbs (Lt = 60 lbs)    Time 10    Period Weeks    Status Achieved   Rt = 70.1 lbs, Lt = 71.8 lbs     OT LONG TERM GOAL #3   Title Pt to perform simple cooking task (egg) with distant supervision    Time 10    Period Weeks     Status New      OT LONG TERM GOAL #4   Title Pt to consistently perform light IADLS (all cleaning) at mod I level    Time 10    Period Weeks    Status New      OT LONG TERM GOAL #5   Title Pt to perform simple financial management with direct supervision    Time 10    Period Weeks    Status New            Pt did not meet most LTG's secondary to cancelling remaining appointments.   Remaining deficits: Special educational needs teacher / Equipment: HEP's, safety considerations and DME recommendations   Patient agrees to discharge. Patient goals were partially met. Patient is being discharged due to the patient's  request. Pt reports he does not wish to come to therapy at this time as he is just not feeling up to it.       Carey Bullocks, OTR/L 02/03/2021, 8:33 AM  [facility] [address] [place], [place], [address] Phone: [phone]   Fax:  [phone]

## 2021-02-04 ENCOUNTER — Ambulatory Visit: Payer: Medicare Other | Admitting: Speech Pathology

## 2021-02-04 ENCOUNTER — Ambulatory Visit: Payer: Medicare Other | Admitting: Physical Therapy

## 2021-02-04 ENCOUNTER — Ambulatory Visit: Payer: Medicare Other | Admitting: Occupational Therapy

## 2021-02-09 ENCOUNTER — Ambulatory Visit: Payer: Medicare Other | Admitting: Physical Therapy

## 2021-02-09 ENCOUNTER — Ambulatory Visit: Payer: Medicare Other | Admitting: Speech Pathology

## 2021-02-09 ENCOUNTER — Ambulatory Visit: Payer: Medicare Other | Admitting: Occupational Therapy

## 2021-03-06 ENCOUNTER — Telehealth: Payer: Self-pay | Admitting: Internal Medicine

## 2021-03-06 NOTE — Telephone Encounter (Signed)
Rec'd call from pt's Niece ([place]) Listed in the Chart, requesting (PCS).  Please call the niece back.  Pt's LOV was October 2022 with Dr. Allyson Sabal.

## 2021-03-18 NOTE — Telephone Encounter (Signed)
RETURNED CALL TO NIECE (PCS paper work faxed [place] / phone [phone]) FOR REVIEW.

## 2021-03-27 ENCOUNTER — Other Ambulatory Visit: Payer: Self-pay | Admitting: Internal Medicine

## 2021-03-27 DIAGNOSIS — I152 Hypertension secondary to endocrine disorders: Secondary | ICD-10-CM

## 2021-03-27 DIAGNOSIS — E1159 Type 2 diabetes mellitus with other circulatory complications: Secondary | ICD-10-CM

## 2021-03-30 ENCOUNTER — Encounter: Payer: Self-pay | Admitting: Dietician

## 2021-04-12 ENCOUNTER — Encounter (HOSPITAL_COMMUNITY): Payer: Self-pay | Admitting: Emergency Medicine

## 2021-04-12 ENCOUNTER — Emergency Department (HOSPITAL_COMMUNITY): Payer: Medicare Other

## 2021-04-12 ENCOUNTER — Inpatient Hospital Stay (HOSPITAL_COMMUNITY)
Admission: EM | Admit: 2021-04-12 | Discharge: 2021-04-16 | DRG: 637 | Disposition: A | Discharge status: Home Health | Payer: Medicare Other | Attending: Internal Medicine | Admitting: Internal Medicine

## 2021-04-12 DIAGNOSIS — Z79899 Other long term (current) drug therapy: Secondary | ICD-10-CM

## 2021-04-12 DIAGNOSIS — M6282 Rhabdomyolysis: Secondary | ICD-10-CM

## 2021-04-12 DIAGNOSIS — G40909 Epilepsy, unspecified, not intractable, without status epilepticus: Secondary | ICD-10-CM | POA: Diagnosis present

## 2021-04-12 DIAGNOSIS — E113293 Type 2 diabetes mellitus with mild nonproliferative diabetic retinopathy without macular edema, bilateral: Secondary | ICD-10-CM | POA: Diagnosis present

## 2021-04-12 DIAGNOSIS — F17210 Nicotine dependence, cigarettes, uncomplicated: Secondary | ICD-10-CM | POA: Diagnosis present

## 2021-04-12 DIAGNOSIS — T796XXA Traumatic ischemia of muscle, initial encounter: Secondary | ICD-10-CM

## 2021-04-12 DIAGNOSIS — E1140 Type 2 diabetes mellitus with diabetic neuropathy, unspecified: Secondary | ICD-10-CM | POA: Diagnosis present

## 2021-04-12 DIAGNOSIS — I152 Hypertension secondary to endocrine disorders: Secondary | ICD-10-CM | POA: Diagnosis present

## 2021-04-12 DIAGNOSIS — Z8249 Family history of ischemic heart disease and other diseases of the circulatory system: Secondary | ICD-10-CM

## 2021-04-12 DIAGNOSIS — Z880 Allergy status to penicillin: Secondary | ICD-10-CM

## 2021-04-12 DIAGNOSIS — K219 Gastro-esophageal reflux disease without esophagitis: Secondary | ICD-10-CM | POA: Diagnosis present

## 2021-04-12 DIAGNOSIS — E785 Hyperlipidemia, unspecified: Secondary | ICD-10-CM | POA: Diagnosis present

## 2021-04-12 DIAGNOSIS — R296 Repeated falls: Secondary | ICD-10-CM | POA: Diagnosis present

## 2021-04-12 DIAGNOSIS — E1159 Type 2 diabetes mellitus with other circulatory complications: Secondary | ICD-10-CM | POA: Diagnosis not present

## 2021-04-12 DIAGNOSIS — N184 Chronic kidney disease, stage 4 (severe): Secondary | ICD-10-CM | POA: Diagnosis present

## 2021-04-12 DIAGNOSIS — N183 Chronic kidney disease, stage 3 unspecified: Secondary | ICD-10-CM | POA: Diagnosis not present

## 2021-04-12 DIAGNOSIS — E11649 Type 2 diabetes mellitus with hypoglycemia without coma: Principal | ICD-10-CM | POA: Diagnosis present

## 2021-04-12 DIAGNOSIS — N179 Acute kidney failure, unspecified: Secondary | ICD-10-CM | POA: Diagnosis present

## 2021-04-12 DIAGNOSIS — Z20822 Contact with and (suspected) exposure to covid-19: Secondary | ICD-10-CM | POA: Diagnosis present

## 2021-04-12 DIAGNOSIS — N1831 Chronic kidney disease, stage 3a: Secondary | ICD-10-CM | POA: Diagnosis present

## 2021-04-12 DIAGNOSIS — J449 Chronic obstructive pulmonary disease, unspecified: Secondary | ICD-10-CM | POA: Diagnosis present

## 2021-04-12 DIAGNOSIS — F1011 Alcohol abuse, in remission: Secondary | ICD-10-CM

## 2021-04-12 DIAGNOSIS — Z8673 Personal history of transient ischemic attack (TIA), and cerebral infarction without residual deficits: Secondary | ICD-10-CM

## 2021-04-12 DIAGNOSIS — D631 Anemia in chronic kidney disease: Secondary | ICD-10-CM | POA: Diagnosis present

## 2021-04-12 DIAGNOSIS — G9341 Metabolic encephalopathy: Secondary | ICD-10-CM | POA: Diagnosis present

## 2021-04-12 DIAGNOSIS — E162 Hypoglycemia, unspecified: Secondary | ICD-10-CM

## 2021-04-12 DIAGNOSIS — Z8546 Personal history of malignant neoplasm of prostate: Secondary | ICD-10-CM

## 2021-04-12 DIAGNOSIS — R5381 Other malaise: Secondary | ICD-10-CM

## 2021-04-12 DIAGNOSIS — E1169 Type 2 diabetes mellitus with other specified complication: Secondary | ICD-10-CM | POA: Diagnosis present

## 2021-04-12 DIAGNOSIS — Z7984 Long term (current) use of oral hypoglycemic drugs: Secondary | ICD-10-CM

## 2021-04-12 DIAGNOSIS — E1122 Type 2 diabetes mellitus with diabetic chronic kidney disease: Secondary | ICD-10-CM | POA: Diagnosis present

## 2021-04-12 DIAGNOSIS — Z7982 Long term (current) use of aspirin: Secondary | ICD-10-CM

## 2021-04-12 HISTORY — DX: Rhabdomyolysis: M62.82

## 2021-04-12 LAB — CBC WITH DIFFERENTIAL/PLATELET
Abs Immature Granulocytes: 0.03 10*3/uL (ref 0.00–0.07)
Basophils Absolute: 0 10*3/uL (ref 0.0–0.1)
Basophils Relative: 0 %
Eosinophils Absolute: 0 10*3/uL (ref 0.0–0.5)
Eosinophils Relative: 0 %
HCT: 28.3 % — ABNORMAL LOW (ref 39.0–52.0)
Hemoglobin: 8.7 g/dL — ABNORMAL LOW (ref 13.0–17.0)
Immature Granulocytes: 0 %
Lymphocytes Relative: 11 %
Lymphs Abs: 0.7 10*3/uL (ref 0.7–4.0)
MCH: 31.1 pg (ref 26.0–34.0)
MCHC: 30.7 g/dL (ref 30.0–36.0)
MCV: 101.1 fL — ABNORMAL HIGH (ref 80.0–100.0)
Monocytes Absolute: 0.7 10*3/uL (ref 0.1–1.0)
Monocytes Relative: 11 %
Neutro Abs: 5.4 10*3/uL (ref 1.7–7.7)
Neutrophils Relative %: 78 %
Platelets: 175 10*3/uL (ref 150–400)
RBC: 2.8 MIL/uL — ABNORMAL LOW (ref 4.22–5.81)
RDW: 13.2 % (ref 11.5–15.5)
WBC: 6.9 10*3/uL (ref 4.0–10.5)
nRBC: 0 % (ref 0.0–0.2)

## 2021-04-12 LAB — COMPREHENSIVE METABOLIC PANEL
ALT: 44 U/L (ref 0–44)
AST: 101 U/L — ABNORMAL HIGH (ref 15–41)
Albumin: 3.2 g/dL — ABNORMAL LOW (ref 3.5–5.0)
Alkaline Phosphatase: 46 U/L (ref 38–126)
Anion gap: 8 (ref 5–15)
BUN: 30 mg/dL — ABNORMAL HIGH (ref 8–23)
CO2: 21 mmol/L — ABNORMAL LOW (ref 22–32)
Calcium: 8.5 mg/dL — ABNORMAL LOW (ref 8.9–10.3)
Chloride: 111 mmol/L (ref 98–111)
Creatinine, Ser: 1.55 mg/dL — ABNORMAL HIGH (ref 0.61–1.24)
GFR, Estimated: 47 mL/min — ABNORMAL LOW (ref >60)
Glucose, Bld: 133 mg/dL — ABNORMAL HIGH (ref 70–99)
Potassium: 3.6 mmol/L (ref 3.5–5.1)
Sodium: 140 mmol/L (ref 135–145)
Total Bilirubin: 0.4 mg/dL (ref 0.3–1.2)
Total Protein: 7.4 g/dL (ref 6.5–8.1)

## 2021-04-12 LAB — CBG MONITORING, ED
Glucose-Capillary: 144 mg/dL — ABNORMAL HIGH (ref 70–99)
Glucose-Capillary: 30 mg/dL — CL (ref 70–99)
Glucose-Capillary: 125 mg/dL — ABNORMAL HIGH (ref 70–99)
Glucose-Capillary: 89 mg/dL (ref 70–99)

## 2021-04-12 LAB — RESP PANEL BY RT-PCR (FLU A&B, COVID) ARPGX2
Influenza A by PCR: NEGATIVE
Influenza B by PCR: NEGATIVE
SARS Coronavirus 2 by RT PCR: NEGATIVE

## 2021-04-12 LAB — TYPE AND SCREEN
ABO/RH(D): O POS
Antibody Screen: NEGATIVE

## 2021-04-12 LAB — GLUCOSE, CAPILLARY: Glucose-Capillary: 73 mg/dL (ref 70–99)

## 2021-04-12 LAB — CK: Total CK: 2927 U/L — ABNORMAL HIGH (ref 49–397)

## 2021-04-12 MED ORDER — ACETAMINOPHEN 650 MG RE SUPP: 650.0000 mg | Freq: Four times a day (QID) | RECTAL | Status: DC | PRN

## 2021-04-12 MED ORDER — SODIUM CHLORIDE 0.9 % IV BOLUS
1000.0000 mL | Freq: Once | INTRAVENOUS | Status: AC
  Administered 2021-04-13: 04:00:00 1000 mL via INTRAVENOUS

## 2021-04-12 MED ORDER — LEVETIRACETAM 500 MG PO TABS
500.0000 mg | ORAL_TABLET | Freq: Two times a day (BID) | ORAL | Status: DC
  Administered 2021-04-12 – 2021-04-16 (×8): 500 mg via ORAL
  Filled 2021-04-12 (×8): qty 1

## 2021-04-12 MED ORDER — DEXTROSE 50 % IV SOLN
50.0000 mL | Freq: Once | INTRAVENOUS | Status: AC
  Administered 2021-04-12: 16:00:00 50 mL via INTRAVENOUS
  Filled 2021-04-12: qty 50

## 2021-04-12 MED ORDER — PANTOPRAZOLE SODIUM 40 MG PO TBEC
40.0000 mg | DELAYED_RELEASE_TABLET | Freq: Every day | ORAL | Status: DC
  Administered 2021-04-12 – 2021-04-16 (×5): 40 mg via ORAL
  Filled 2021-04-12 (×5): qty 1

## 2021-04-12 MED ORDER — ACETAMINOPHEN 325 MG PO TABS
650.0000 mg | ORAL_TABLET | Freq: Four times a day (QID) | ORAL | Status: DC | PRN
  Administered 2021-04-12: 23:00:00 650 mg via ORAL
  Filled 2021-04-12: qty 2

## 2021-04-12 MED ORDER — ONDANSETRON HCL 4 MG/2ML IJ SOLN: 4.0000 mg | Freq: Four times a day (QID) | INTRAMUSCULAR | Status: DC | PRN

## 2021-04-12 MED ORDER — DEXTROSE 10 % IV SOLN: INTRAVENOUS | Status: DC

## 2021-04-12 MED ORDER — HEPARIN SODIUM (PORCINE) 5000 UNIT/ML IJ SOLN
5000.0000 [IU] | Freq: Three times a day (TID) | INTRAMUSCULAR | Status: DC
  Administered 2021-04-13 – 2021-04-16 (×11): 5000 [IU] via SUBCUTANEOUS
  Filled 2021-04-12 (×11): qty 1

## 2021-04-12 MED ORDER — AMLODIPINE BESYLATE 10 MG PO TABS
10.0000 mg | ORAL_TABLET | Freq: Every day | ORAL | Status: DC
  Administered 2021-04-12 – 2021-04-16 (×5): 10 mg via ORAL
  Filled 2021-04-12 (×5): qty 1

## 2021-04-12 MED ORDER — ONDANSETRON HCL 4 MG PO TABS: 4.0000 mg | ORAL_TABLET | Freq: Four times a day (QID) | ORAL | Status: DC | PRN

## 2021-04-12 NOTE — Assessment & Plan Note (Signed)
Stable

## 2021-04-12 NOTE — Assessment & Plan Note (Addendum)
Chronic, appears close to baseline, CKD IIIa

## 2021-04-12 NOTE — Assessment & Plan Note (Signed)
Chronic. 

## 2021-04-12 NOTE — H&P (Addendum)
History and Physical    Gregory Nunez Gregory Nunez. CXK:[id number] DOB: [birth year] DOA: 04/12/2021  PCP: Farrel Gordon, DO   Patient coming from: Home  I have personally briefly reviewed patient's old medical records in [place]  Chief complaint: Altered mental status, found on the floor History present illness: 75 year old African-American male with a history of hypertension, type 2 diabetes, CKD stage III presents the ER today with hyperglycemia.  Patient found on the floor by his nephew.  Unknown the last time he was seen by [facility] person.  He arrived by EMS.  In EMS, blood sugar was 55.  Patient given D10 by EMS.  Despite multiple boluses of IV dextrose, patient remained hypoglycemic.  Patient started on a D10 drip.  [facility] hospitalist contacted for admission.   ED Course: remains hypoglycemic despite D50. Needed to start D10W IVF. Labs show CK elevated consistent with rhabdo.  Review of Systems:  Review of Systems  Constitutional:  Negative for chills and fever.  HENT: Negative.    Eyes: Negative.   Respiratory:  Positive for cough.   Cardiovascular: Negative.   Gastrointestinal: Negative.   Genitourinary: Negative.   Musculoskeletal:  Positive for myalgias.  Skin:        Multiple abrasions on forearms, hands, knees and face  Neurological:  Positive for speech change.       Chronic dysarthria  Endo/Heme/Allergies: Negative.   Psychiatric/Behavioral: Negative.    All other systems reviewed and are negative.  Past Medical History:  Diagnosis Date   Acute metabolic encephalopathy 12/24/261   Alcohol use 04/23/2020   Alcohol withdrawal seizure with complication, with unspecified complication ([place]) 7/85/8850   CAO (chronic airflow obstruction) (HCC)    Cataract    OD   CVA (cerebral vascular accident) ([place])    Depression    Diabetes mellitus without complication ([place])    Diabetic retinopathy ([place])    NPDR OU   GERD (gastroesophageal reflux disease)    if drinks  alcohol   GI bleed 10/05/2020   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   Patient is approximately 5-week status post bunion correction left foot   Hyperlipidemia    Hypertension    Hypertensive retinopathy    OU   Malignant neoplasm of prostate ([place]) 01/09/2014   Neuropathy    Pancreatitis    Pneumococcal vaccination administered at current visit 11/10/2020   Prostate cancer ([place]) 12/19/2013   Gleason 4+3=7, volume 31.31 cc   Sciatica    Shortness of breath dyspnea    with exertion     Past Surgical History:  Procedure Laterality Date   BIOPSY  04/16/2018   Procedure: BIOPSY;  Surgeon: Juanita Craver, MD;  Location: [facility] ENDOSCOPY;  Service: Endoscopy;;   BIOPSY  05/23/2020   Procedure: BIOPSY;  Surgeon: Jackquline Denmark, MD;  Location: [facility] ENDOSCOPY;  Service: Gastroenterology;;  EGD and COLON   BIOPSY  12/09/2020   Procedure: BIOPSY;  Surgeon: Thornton Park, MD;  Location: [facility] ENDOSCOPY;  Service: Gastroenterology;;   biopsy on throat     hx of    CATARACT EXTRACTION Bilateral    Dr. Quentin Ore   COLONOSCOPY N/A 05/23/2020   Procedure: COLONOSCOPY;  Surgeon: Jackquline Denmark, MD;  Location: [facility] ENDOSCOPY;  Service: Gastroenterology;  Laterality: N/A;   ENTEROSCOPY N/A 10/07/2020   Procedure: ENTEROSCOPY;  Surgeon: [name], MD;  Location: [facility] ENDOSCOPY;  Service: Endoscopy;  Laterality: N/A;   ESOPHAGOGASTRODUODENOSCOPY Left 04/16/2018   Procedure: ESOPHAGOGASTRODUODENOSCOPY (EGD);  Surgeon: Juanita Craver, MD;  Location: [facility]OS[facility];  Service: Endoscopy;  Laterality: Left;   ESOPHAGOGASTRODUODENOSCOPY (EGD) WITH PROPOFOL N/A 05/23/2020   Procedure: ESOPHAGOGASTRODUODENOSCOPY (EGD) WITH PROPOFOL;  Surgeon: Jackquline Denmark, MD;  Location: [facility];  Service: Gastroenterology;  Laterality: N/A;   ESOPHAGOGASTRODUODENOSCOPY (EGD) WITH PROPOFOL N/A 12/09/2020   Procedure: ESOPHAGOGASTRODUODENOSCOPY (EGD) WITH PROPOFOL;  Surgeon: Thornton Park, MD;  Location: [place];  Service:  Gastroenterology;  Laterality: N/A;   EYE SURGERY     FOOT SURGERY     HOT HEMOSTASIS N/A 04/16/2018   Procedure: HOT HEMOSTASIS (ARGON PLASMA COAGULATION/BICAP);  Surgeon: Juanita Craver, MD;  Location: [facility];  Service: Endoscopy;  Laterality: N/A;   HOT HEMOSTASIS N/A 05/23/2020   Procedure: HOT HEMOSTASIS (ARGON PLASMA COAGULATION/BICAP);  Surgeon: Jackquline Denmark, MD;  Location: [facility]OS[facility];  Service: Gastroenterology;  Laterality: N/A;   HOT HEMOSTASIS N/A 12/09/2020   Procedure: HOT HEMOSTASIS (ARGON PLASMA COAGULATION/BICAP);  Surgeon: Thornton Park, MD;  Location: [place];  Service: Gastroenterology;  Laterality: N/A;   LYMPHADENECTOMY Bilateral 02/27/2014   Procedure: BILATERAL LYMPHADENECTOMY;  Surgeon: Alexis Frock, MD;  Location: [facility];  Service: Urology;  Laterality: Bilateral;   POLYPECTOMY  05/23/2020   Procedure: POLYPECTOMY;  Surgeon: Jackquline Denmark, MD;  Location: [facility] ENDOSCOPY;  Service: Gastroenterology;;   PROSTATE BIOPSY  12/2013   Gleason 4+3=7, volume 31.31 cc   ROBOT ASSISTED LAPAROSCOPIC RADICAL PROSTATECTOMY N/A 02/27/2014   Procedure: ROBOTIC ASSISTED LAPAROSCOPIC RADICAL PROSTATECTOMY WITH INDOCYANINE GREEN DYE;  Surgeon: Alexis Frock, MD;  Location: [facility];  Service: Urology;  Laterality: N/A;     reports that he has been smoking cigarettes. He has been smoking an average of .5 packs per day. He has never used smokeless tobacco. He reports current alcohol use of about 1.0 standard drink per week. He reports that he does not currently use drugs after having used the following drugs: Marijuana and Cocaine.  Allergies  Allergen Reactions   Penicillins Anxiety and Other (See Comments)    DID THE REACTION INVOLVE: Swelling of the face/tongue/throat, SOB, or low BP? No Sudden or severe rash/hives, skin peeling, or the inside of the mouth or nose? No Did it require medical treatment? No When did it last happen?      many years ago If all above answers are  NO, may proceed with cephalosporin use.       Family History  Problem Relation Age of Onset   Heart disease Mother    Heart attack Father 60   Cancer Sister        breast   Colon cancer Neg Hx    Esophageal cancer Neg Hx    Rectal cancer Neg Hx    Stomach cancer Neg Hx     Prior to Admission medications   Medication Sig Start Date End Date Taking? Authorizing Provider  amLODipine (NORVASC) 10 MG tablet TAKE 1 TABLET BY MOUTH EVERY DAY 03/27/21   Farrel Gordon, DO  aspirin EC 81 MG EC tablet Take 1 tablet (81 mg total) by mouth daily. Swallow whole. 10/24/20 10/24/21  Jennye Boroughs, MD  docusate sodium (COLACE) 250 MG capsule Take 1 capsule (250 mg total) by mouth daily. 12/26/20   Delene Ruffini, MD  ferrous sulfate 325 (65 FE) MG tablet Take 325 mg by mouth daily with breakfast.    [provider]  iron polysaccharides (NU-IRON) 150 MG capsule Take 1 capsule (150 mg total) by mouth daily. Patient not taking: No sig reported 10/24/20   Jennye Boroughs, MD  levETIRAcetam (KEPPRA) 500  MG tablet Take 1 tablet (500 mg total) by mouth 2 (two) times daily. 01/05/21   Penumalli, Earlean Polka, MD  losartan (COZAAR) 100 MG tablet Take 100 mg by mouth daily.    [provider]  metFORMIN (GLUCOPHAGE) 500 MG tablet Take 500 mg by mouth 2 (two) times daily. 02/13/19   [provider]  pantoprazole (PROTONIX) 40 MG tablet Take 1 tablet (40 mg total) by mouth daily. 11/06/20   Farrel Gordon, DO  COMBIVENT RESPIMAT 20-100 MCG/ACT AERS respimat Inhale 1 puff into the lungs every 6 (six) hours as needed for shortness of breath. 06/12/18 08/19/19  [provider]  mometasone-formoterol (DULERA) 100-5 MCG/ACT AERO Inhale 2 puffs into the lungs daily. Patient not taking: Reported on 08/19/2019 04/17/18 08/19/19  Dana Allan I, MD  sucralfate (CARAFATE) 1 g tablet Take 1 tablet (1 g total) by mouth 4 (four) times daily. Patient not taking: Reported on 08/19/2019 04/17/18 08/19/19   Dana Allan I, MD  tiotropium (SPIRIVA HANDIHALER) 18 MCG inhalation capsule Place 1 capsule (18 mcg total) into inhaler and inhale daily. Patient not taking: Reported on 08/19/2019 04/17/18 08/19/19  Dana Allan I, MD  traZODone (DESYREL) 50 MG tablet Take 50 mg by mouth at bedtime as needed for sleep. Patient not taking: Reported on 10/06/2020  10/08/20  [provider]    Physical Exam: Vitals:   04/12/21 1605 04/12/21 1616 04/12/21 1800  BP: (!) 158/85 (!) 166/92 (!) 170/109  Pulse: 99 (!) 102 (!) 112  Resp: 20 (!) 23 14  Temp: 98.5 F (36.9 C)    TempSrc: Oral    SpO2: 96% 100% 100%  Weight: 98.7 kg    Height: 5\' 11"  (1.803 m)      Physical Exam Vitals and nursing note reviewed.  Constitutional:      General: He is not in acute distress.    Appearance: He is ill-appearing. He is not toxic-appearing or diaphoretic.     Comments: Appears chronically ill  HENT:     Head: Normocephalic and atraumatic.     Nose: No congestion or rhinorrhea.  Eyes:     General:        Right eye: No discharge.        Left eye: No discharge.  Cardiovascular:     Rate and Rhythm: Normal rate and regular rhythm.  Pulmonary:     Breath sounds: Rhonchi present.  Abdominal:     General: Abdomen is flat. Bowel sounds are normal. There is no distension.     Tenderness: There is no abdominal tenderness. There is no guarding or rebound.  Skin:    General: Skin is warm and dry.     Capillary Refill: Capillary refill takes less than 2 seconds.     Comments: Areas of multiple abrasions. In various stages of healing.  Neurological:     Comments: Dysarthric speech     Labs on Admission: I have personally reviewed following labs and imaging studies  CBC: Recent Labs  Lab 04/12/21 1700  WBC 6.9  NEUTROABS 5.4  HGB 8.7*  HCT 28.3*  MCV 101.1*  PLT 973   Basic Metabolic Panel: Recent Labs  Lab 04/12/21 1700  NA 140  K 3.6  CL 111  CO2 21*  GLUCOSE 133*  BUN 30*   CREATININE 1.55*  CALCIUM 8.5*   GFR: Estimated Creatinine Clearance: 50.9 mL/min (A) (by C-G formula based on SCr of 1.55 mg/dL (H)). Liver Function Tests: Recent Labs  Lab 04/12/21 1700  AST 101*  ALT 44  ALKPHOS 46  BILITOT 0.4  PROT 7.4  ALBUMIN 3.2*   No results for input(s): LIPASE, AMYLASE in the last 168 hours. No results for input(s): AMMONIA in the last 168 hours. Coagulation Profile: No results for input(s): INR, PROTIME in the last 168 hours. Cardiac Enzymes: Recent Labs  Lab 04/12/21 1700  CKTOTAL 2,927*   BNP (last 3 results) No results for input(s): PROBNP in the last 8760 hours. HbA1C: No results for input(s): HGBA1C in the last 72 hours. CBG: Recent Labs  Lab 04/12/21 1552 04/12/21 1622 04/12/21 1728 04/12/21 1818  GLUCAP 30* 144* 125* 89   Lipid Profile: No results for input(s): CHOL, HDL, LDLCALC, TRIG, CHOLHDL, LDLDIRECT in the last 72 hours. Thyroid Function Tests: No results for input(s): TSH, T4TOTAL, FREET4, T3FREE, THYROIDAB in the last 72 hours. Anemia Panel: No results for input(s): VITAMINB12, FOLATE, FERRITIN, TIBC, IRON, RETICCTPCT in the last 72 hours. Urine analysis:    Component Value Date/Time   COLORURINE YELLOW 10/22/2020 2034   APPEARANCEUR CLEAR 10/22/2020 2034   LABSPEC 1.013 10/22/2020 2034   PHURINE 5.0 10/22/2020 2034   GLUCOSEU 50 (A) 10/22/2020 2034   HGBUR NEGATIVE 10/22/2020 2034   BILIRUBINUR NEGATIVE 10/22/2020 2034   KETONESUR NEGATIVE 10/22/2020 2034   PROTEINUR >=300 (A) 10/22/2020 2034   UROBILINOGEN 1.0 01/10/2019 1132   NITRITE NEGATIVE 10/22/2020 2034   LEUKOCYTESUR NEGATIVE 10/22/2020 2034    Radiological Exams on Admission: I have personally reviewed images CT HEAD WO CONTRAST (5MM)  Result Date: 04/12/2021 CLINICAL DATA:  [place] with trauma to the head and neck. EXAM: CT HEAD WITHOUT CONTRAST TECHNIQUE: Contiguous axial images were obtained from the base of the skull through the vertex without  intravenous contrast. COMPARISON:  10/22/2020 FINDINGS: Brain: Old small vessel ischemic changes affect the pons. No focal cerebellar finding. Cerebral hemispheres show chronic small-vessel ischemic changes of the white matter and old infarction in the left basal ganglia and radiating white matter tracts. No acute infarction, mass lesion, hemorrhage, hydrocephalus or extra-axial collection. Vascular: There is atherosclerotic calcification of the major vessels at the base of the brain. Skull: No skull fracture. Sinuses/Orbits: Clear/normal Other: None IMPRESSION: No acute or traumatic CT finding. Chronic small-vessel ischemic changes of the brainstem and cerebral hemispheric white matter. 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 Electronically Signed   By: Nelson Chimes M.D.   On: 04/12/2021 17:11   CT Cervical Spine Wo Contrast  Result Date: 04/12/2021 CLINICAL DATA:  Ataxia.  Trauma to the head and neck. EXAM: CT CERVICAL SPINE WITHOUT CONTRAST TECHNIQUE: Multidetector CT imaging of the cervical spine was performed without intravenous contrast. Multiplanar CT image reconstructions were also generated. COMPARISON:  Radiography 11/11/2015 FINDINGS: Alignment: Mild curvature convex to the right. Straightening of the normal cervical lordosis. No traumatic malalignment. Skull base and vertebrae: No evidence of regional fracture. Prominent anterior osteophytes in the cervical region which can in certain patients be associated with dysphagia. Soft tissues and spinal canal: Negative Disc levels: The foramen magnum is widely patent. There is ordinary mild osteoarthritis of the C1-2 articulation but no encroachment upon the neural structures. C2-3: Bilateral uncovertebral osteophytes. Foraminal narrowing left worse than right. C3-4: Endplate osteophytes and chronic disc protrusion. Uncovertebral hypertrophy. Moderate canal and foraminal stenosis. C4-5: Bilateral uncovertebral prominence  with bilateral foraminal narrowing. C5-6: Mild bulging of the disc.  No significant stenosis. C6-7: Mild bulging of the disc.  No significant stenosis. C7-T1: Mild bulging  of the disc.  No significant stenosis. Upper chest: Negative Other: None IMPRESSION: No acute or traumatic finding. Chronic degenerative changes throughout the neck as outlined above. Stenosis of the canal and foramina at C3-4 that could be significant. Foraminal stenosis bilateral at C4-5 and left worse than right at C2-3. Electronically Signed   By: Nelson Chimes M.D.   On: 04/12/2021 17:14   DG Pelvis Portable  Result Date: 04/12/2021 CLINICAL DATA:  Hyperglycemia, fall EXAM: PORTABLE PELVIS 1-2 VIEWS COMPARISON:  Portable exam 1608 hours without priors for comparison FINDINGS: Mild osseous demineralization. Hip and SI joint spaces preserved. No acute fracture, dislocation, or bone destruction. IMPRESSION: No acute osseous abnormalities. Electronically Signed   By: Lavonia Dana M.D.   On: 04/12/2021 16:21   DG Chest Port 1 View  Result Date: 04/12/2021 CLINICAL DATA:  Fall, hypoglycemia EXAM: PORTABLE CHEST 1 VIEW COMPARISON:  Portable exam 1607 hours compared to 10/05/2020 FINDINGS: Normal heart size, mediastinal contours, and pulmonary vascularity. Minimal subsegmental atelectasis mid to lower RIGHT lung. Lungs otherwise clear. No acute infiltrate, pleural effusion, or pneumothorax. Osseous structures unremarkable. IMPRESSION: Minimal RIGHT basilar atelectasis. Electronically Signed   By: Lavonia Dana M.D.   On: 04/12/2021 16:20    EKG: I have personally reviewed EKG: no EKG  Assessment/Plan Principal Problem:   Diabetic hypoglycemia (HCC) Active Problems:   Seizure disorder (HCC)   Anemia due to chronic kidney disease   Hypertension associated with diabetes (HCC)   CKD (chronic kidney disease) stage 3, GFR 30-59 ml/min (HCC)   Rhabdomyolysis    Diabetic hypoglycemia ([place]) Observation telemetry bed. Continue with D10W  IVF. Pt states he only metformin at home.  Unlikely that diabetic hypoglycemia is due to metformin only.  Patient has a prior history of being on Amaryl although he denies taking this.  We will check an insulin level on him to see if he may be injecting insulin.  Check A1c.   CKD (chronic kidney disease) stage 3, GFR 30-59 ml/min (HCC) Chronic.  Hypertension associated with diabetes ([place]) Stable.  Rhabdomyolysis Likely due to recent falls.  Patient states he has been crawling on the ground at home.  He was found by his nephew today.  It appears that the patient is correct and that he has been has various abrasions on his arms, face, elbows and knees.  Continue with IV fluids.  Check CK again in the morning.  Anemia due to chronic kidney disease Chronic.  Seizure disorder ([place]) Continue keppra. Avoid hypoglycemia.  DVT prophylaxis: SQ Heparin Code Status: Full Code Family Communication: no family at bedside  Disposition Plan: return home  Consults called: none  Admission status: Observation, Med-Surg   Kristopher Oppenheim, DO [facility] 04/12/2021, 7:55 PM

## 2021-04-12 NOTE — ED Provider Notes (Signed)
[facility] DEPT Provider Note   CSN: [id number] Arrival date & time: 04/12/21  1533    LEVEL 5 CAVEAT: CONFUSION  History No chief complaint on file.   Gregory Mansel Nakhi Choi. is a 75 y.o. male with history of non-insulin-dependent diabetes who presents the emergency department with hypoglycemia episodes that have been ongoing since last night.  Per the nephew, patient was hypoglycemic last night and had EMS at his house.  They gave him some sugar which improved his hypoglycemia.  They also made him some food which apparently he did not eat.  Patient also did not eat today.  Now presents today with hyperglycemia and altered mental status.  Patient is confused and unable to provide history at this time.  HPI     Past Medical History:  Diagnosis Date   CAO (chronic airflow obstruction) (HCC)    Cataract    OD   CVA (cerebral vascular accident) ([place])    Depression    Diabetes mellitus without complication ([place])    Diabetic retinopathy ([place])    NPDR OU   GERD (gastroesophageal reflux disease)    if drinks alcohol   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   Patient is approximately 5-week status post bunion correction left foot   Hyperlipidemia    Hypertension    Hypertensive retinopathy    OU   Malignant neoplasm of prostate ([place]) 01/09/2014   Neuropathy    Pancreatitis    Pneumococcal vaccination administered at current visit 11/10/2020   Prostate cancer ([place]) 12/19/2013   Gleason 4+3=7, volume 31.31 cc   Sciatica    Shortness of breath dyspnea    with exertion     Patient Active Problem List   Diagnosis Date Noted   Hospital discharge follow-up 12/24/2020   Weakness of both legs    Adenomatous duodenal polyp    AVM (arteriovenous malformation) of duodenum, acquired with hemorrhage    Need for hepatitis C screening test 11/10/2020   Healthcare maintenance 11/06/2020   Hypoglycemia 87/56/4332   Acute metabolic encephalopathy 95/18/8416    Acute on chronic anemia 10/22/2020   Angiodysplasia of intestine    GI bleed 10/05/2020   Iron deficiency anemia 05/22/2020   Constipation 05/22/2020   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 ([place])    History of CVA (cerebrovascular accident) 04/23/2020   Hyperlipidemia associated with type 2 diabetes mellitus ([place]) 04/23/2020   Alcohol use 04/23/2020   CKD (chronic kidney disease) stage 3, GFR 30-59 ml/min ([place]) 04/23/2020   Tobacco use 04/23/2020   Syncopal seizure ([place]) 11/08/2018   Hypertension associated with diabetes ([place]) 11/08/2018   Alcohol withdrawal seizure with complication, with unspecified complication ([place]) 60/63/0160   Type 2 diabetes mellitus without complication ([place]) 10/93/2355   Symptomatic anemia 04/15/2018   Prostate cancer ([place]) 02/27/2014   Bunion 01/17/2013   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    Past Surgical History:  Procedure Laterality Date   BIOPSY  04/16/2018   Procedure: BIOPSY;  Surgeon: Juanita Craver, MD;  Location: [facility] ENDOSCOPY;  Service: Endoscopy;;   BIOPSY  05/23/2020   Procedure: BIOPSY;  Surgeon: Jackquline Denmark, MD;  Location: [facility] ENDOSCOPY;  Service: Gastroenterology;;  EGD and COLON   BIOPSY  12/09/2020   Procedure: BIOPSY;  Surgeon: Thornton Park, MD;  Location: [facility] ENDOSCOPY;  Service: Gastroenterology;;   biopsy on throat     hx of    CATARACT EXTRACTION Bilateral    Dr. Quentin Ore   COLONOSCOPY N/A 05/23/2020  Procedure: COLONOSCOPY;  Surgeon: Jackquline Denmark, MD;  Location: [facility];  Service: Gastroenterology;  Laterality: N/A;   ENTEROSCOPY N/A 10/07/2020   Procedure: ENTEROSCOPY;  Surgeon: [name], MD;  Location[facility] ENDOSCOPY;  Service: Endoscopy;  Laterality: N/A;   ESOPHAGOGASTRODUODENOSCOPY Left 04/16/2018   Procedure: ESOPHAGOGASTRODUODENOSCOPY (EGD);  Surgeon: Juanita Craver, MD;  Location: [facility];  Service: Endoscopy;  Laterality: Left;   ESOPHAGOGASTRODUODENOSCOPY (EGD) WITH  PROPOFOL N/A 05/23/2020   Procedure: ESOPHAGOGASTRODUODENOSCOPY (EGD) WITH PROPOFOL;  Surgeon: Jackquline Denmark, MD;  Location: [facility];  Service: Gastroenterology;  Laterality: N/A;   ESOPHAGOGASTRODUODENOSCOPY (EGD) WITH PROPOFOL N/A 12/09/2020   Procedure: ESOPHAGOGASTRODUODENOSCOPY (EGD) WITH PROPOFOL;  Surgeon: Thornton Park, MD;  Location: [facility];  Service: Gastroenterology;  Laterality: N/A;   EYE SURGERY     FOOT SURGERY     HOT HEMOSTASIS N/A 04/16/2018   Procedure: HOT HEMOSTASIS (ARGON PLASMA COAGULATION/BICAP);  Surgeon: Juanita Craver, MD;  Location: [facility];  Service: Endoscopy;  Laterality: N/A;   HOT HEMOSTASIS N/A 05/23/2020   Procedure: HOT HEMOSTASIS (ARGON PLASMA COAGULATION/BICAP);  Surgeon: Jackquline Denmark, MD;  Location: [facility];  Service: Gastroenterology;  Laterality: N/A;   HOT HEMOSTASIS N/A 12/09/2020   Procedure: HOT HEMOSTASIS (ARGON PLASMA COAGULATION/BICAP);  Surgeon: Thornton Park, MD;  Location: [facility];  Service: Gastroenterology;  Laterality: N/A;   LYMPHADENECTOMY Bilateral 02/27/2014   Procedure: BILATERAL LYMPHADENECTOMY;  Surgeon: Alexis Frock, MD;  Location: [facility];  Service: Urology;  Laterality: Bilateral;   POLYPECTOMY  05/23/2020   Procedure: POLYPECTOMY;  Surgeon: Jackquline Denmark, MD;  Location: [facility] ENDOS[facility];  Service: Gastroenterology;;   PROSTATE BIOPSY  12/2013   Gleason 4+3=7, volume 31.31 cc   ROBOT ASSISTED LAPAROSCOPIC RADICAL PROSTATECTOMY N/A 02/27/2014   Procedure: ROBOTIC ASSISTED LAPAROSCOPIC RADICAL PROSTATECTOMY WITH INDOCYANINE GREEN DYE;  Surgeon: Alexis Frock, MD;  Location: [facility];  Service: Urology;  Laterality: N/A;       Family History  Problem Relation Age of Onset   Heart disease Mother    Heart attack Father 11   Cancer Sister        breast   Colon cancer Neg Hx    Esophageal cancer Neg Hx    Rectal cancer Neg Hx    Stomach cancer Neg Hx     Social History   Tobacco Use   Smoking status:  Every Day    Packs/day: 0.50    Types: Cigarettes   Smokeless tobacco: Never   Tobacco comments:    1 ppd Wants Patches   Vaping Use   Vaping Use: Never used  Substance Use Topics   Alcohol use: Yes    Alcohol/week: 1.0 standard drink    Types: 1 Cans of beer per week   Drug use: Not Currently    Types: Marijuana, Cocaine    Comment: past hx approx 30 years ago     Home Medications Prior to Admission medications   Medication Sig Start Date End Date Taking? Authorizing Provider  amLODipine (NORVASC) 10 MG tablet TAKE 1 TABLET BY MOUTH EVERY DAY 03/27/21   Farrel Gordon, DO  aspirin EC 81 MG EC tablet Take 1 tablet (81 mg total) by mouth daily. Swallow whole. 10/24/20 10/24/21  Jennye Boroughs, MD  docusate sodium (COLACE) 250 MG capsule Take 1 capsule (250 mg total) by mouth daily. 12/26/20   Delene Ruffini, MD  ferrous sulfate 325 (65 FE) MG tablet Take 325 mg by mouth daily with breakfast.    [provider]  iron polysaccharides (NU-IRON)  150 MG capsule Take 1 capsule (150 mg total) by mouth daily. Patient not taking: No sig reported 10/24/20   Jennye Boroughs, MD  levETIRAcetam (KEPPRA) 500 MG tablet Take 1 tablet (500 mg total) by mouth 2 (two) times daily. 01/05/21   Penumalli, Earlean Polka, MD  losartan (COZAAR) 100 MG tablet Take 100 mg by mouth daily.    [provider]  metFORMIN (GLUCOPHAGE) 500 MG tablet Take 500 mg by mouth 2 (two) times daily. 02/13/19   [provider]  pantoprazole (PROTONIX) 40 MG tablet Take 1 tablet (40 mg total) by mouth daily. 11/06/20   Farrel Gordon, DO  COMBIVENT RESPIMAT 20-100 MCG/ACT AERS respimat Inhale 1 puff into the lungs every 6 (six) hours as needed for shortness of breath. 06/12/18 08/19/19  [provider]  mometasone-formoterol (DULERA) 100-5 MCG/ACT AERO Inhale 2 puffs into the lungs daily. Patient not taking: Reported on 08/19/2019 04/17/18 08/19/19  Dana Allan I, MD  sucralfate (CARAFATE) 1 g tablet Take 1  tablet (1 g total) by mouth 4 (four) times daily. Patient not taking: Reported on 08/19/2019 04/17/18 08/19/19  Dana Allan I, MD  tiotropium (SPIRIVA HANDIHALER) 18 MCG inhalation capsule Place 1 capsule (18 mcg total) into inhaler and inhale daily. Patient not taking: Reported on 08/19/2019 04/17/18 08/19/19  Dana Allan I, MD  traZODone (DESYREL) 50 MG tablet Take 50 mg by mouth at bedtime as needed for sleep. Patient not taking: Reported on 10/06/2020  10/08/20  [provider]    Allergies    Penicillins  Review of Systems   Review of Systems  Unable to perform ROS: Mental status change   Physical Exam Updated Vital Signs BP (!) 170/109    Pulse (!) 112    Temp 98.5 F (36.9 C) (Oral)    Resp 14    Ht 5\' 11"  (1.803 m)    Wt 98.7 kg    SpO2 100%    BMI 30.35 kg/m   Physical Exam Vitals and nursing note reviewed.  Constitutional:      General: He is not in acute distress.    Appearance: Normal appearance.  HENT:     Head: Normocephalic.  Eyes:     General:        Right eye: No discharge.        Left eye: No discharge.  Cardiovascular:     Comments: Regular rate and rhythm.  S1/S2 are distinct without any evidence of murmur, rubs, or gallops.  Radial pulses are 2+ bilaterally.  Dorsalis pedis pulses are 2+ bilaterally.  No evidence of pedal edema. Pulmonary:     Comments: Clear to auscultation bilaterally.  Normal effort.  No respiratory distress.  No evidence of wheezes, rales, or rhonchi heard throughout. Abdominal:     General: Abdomen is flat. Bowel sounds are normal. There is no distension.     Tenderness: There is no abdominal tenderness. There is no guarding or rebound.  Musculoskeletal:        General: Normal range of motion.     Cervical back: Neck supple.  Skin:    General: Skin is warm and dry.     Findings: No rash.     Comments: Multiple scattered abrasions at various stages of healing.  Does have abrasions over the face.  Neurological:      Mental Status: He is lethargic, disoriented and confused.  Psychiatric:        Mood and Affect: Mood normal.  Behavior: Behavior normal.    ED Results / Procedures / Treatments   Labs (all labs ordered are listed, but only abnormal results are displayed) Labs Reviewed  CBC WITH DIFFERENTIAL/PLATELET - Abnormal; Notable for the following components:      Result Value   RBC 2.80 (*)    Hemoglobin 8.7 (*)    HCT 28.3 (*)    MCV 101.1 (*)    All other components within normal limits  COMPREHENSIVE METABOLIC PANEL - Abnormal; Notable for the following components:   CO2 21 (*)    Glucose, Bld 133 (*)    BUN 30 (*)    Creatinine, Ser 1.55 (*)    Calcium 8.5 (*)    Albumin 3.2 (*)    AST 101 (*)    GFR, Estimated 47 (*)    All other components within normal limits  CK - Abnormal; Notable for the following components:   Total CK 2,927 (*)    All other components within normal limits  CBG MONITORING, ED - Abnormal; Notable for the following components:   Glucose-Capillary 30 (*)    All other components within normal limits  CBG MONITORING, ED - Abnormal; Notable for the following components:   Glucose-Capillary 144 (*)    All other components within normal limits  CBG MONITORING, ED - Abnormal; Notable for the following components:   Glucose-Capillary 125 (*)    All other components within normal limits  RESP PANEL BY RT-PCR (FLU A&B, COVID) [id number]  URINALYSIS, ROUTINE W REFLEX MICROSCOPIC  INSULIN, RANDOM  INSULIN-LIKE GROWTH FACTOR  CBG MONITORING, ED  CBG MONITORING, ED  TYPE AND SCREEN    EKG None  Radiology CT HEAD WO CONTRAST (5MM)  Result Date: 04/12/2021 CLINICAL DATA:  [place] with trauma to the head and neck. EXAM: CT HEAD WITHOUT CONTRAST TECHNIQUE: Contiguous axial images were obtained from the base of the skull through the vertex without intravenous contrast. COMPARISON:  10/22/2020 FINDINGS: Brain: Old small vessel ischemic changes affect the pons. No  focal cerebellar finding. Cerebral hemispheres show chronic small-vessel ischemic changes of the white matter and old infarction in the left basal ganglia and radiating white matter tracts. No acute infarction, mass lesion, hemorrhage, hydrocephalus or extra-axial collection. Vascular: There is atherosclerotic calcification of the major vessels at the base of the brain. Skull: No skull fracture. Sinuses/Orbits: Clear/normal Other: None IMPRESSION: No acute or traumatic CT finding. Chronic small-vessel ischemic changes of the brainstem and cerebral hemispheric white matter. 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 Electronically Signed   By: Nelson Chimes M.D.   On: 04/12/2021 17:11   CT Cervical Spine Wo Contrast  Result Date: 04/12/2021 CLINICAL DATA:  Ataxia.  Trauma to the head and neck. EXAM: CT CERVICAL SPINE WITHOUT CONTRAST TECHNIQUE: Multidetector CT imaging of the cervical spine was performed without intravenous contrast. Multiplanar CT image reconstructions were also generated. COMPARISON:  Radiography 11/11/2015 FINDINGS: Alignment: Mild curvature convex to the right. Straightening of the normal cervical lordosis. No traumatic malalignment. Skull base and vertebrae: No evidence of regional fracture. Prominent anterior osteophytes in the cervical region which can in certain patients be associated with dysphagia. Soft tissues and spinal canal: Negative Disc levels: The foramen magnum is widely patent. There is ordinary mild osteoarthritis of the C1-2 articulation but no encroachment upon the neural structures. C2-3: Bilateral uncovertebral osteophytes. Foraminal narrowing left worse than right. C3-4: Endplate osteophytes and chronic disc protrusion. Uncovertebral hypertrophy. Moderate canal and foraminal stenosis. C4-5:  Bilateral uncovertebral prominence with bilateral foraminal narrowing. C5-6: Mild bulging of the disc.  No significant stenosis. C6-7: Mild bulging of  the disc.  No significant stenosis. C7-T1: Mild bulging of the disc.  No significant stenosis. Upper chest: Negative Other: None IMPRESSION: No acute or traumatic finding. Chronic degenerative changes throughout the neck as outlined above. Stenosis of the canal and foramina at C3-4 that could be significant. Foraminal stenosis bilateral at C4-5 and left worse than right at C2-3. Electronically Signed   By: Nelson Chimes M.D.   On: 04/12/2021 17:14   DG Pelvis Portable  Result Date: 04/12/2021 CLINICAL DATA:  Hyperglycemia, fall EXAM: PORTABLE PELVIS 1-2 VIEWS COMPARISON:  Portable exam 1608 hours without priors for comparison FINDINGS: Mild osseous demineralization. Hip and SI joint spaces preserved. No acute fracture, dislocation, or bone destruction. IMPRESSION: No acute osseous abnormalities. Electronically Signed   By: Lavonia Dana M.D.   On: 04/12/2021 16:21   DG Chest Port 1 View  Result Date: 04/12/2021 CLINICAL DATA:  Fall, hypoglycemia EXAM: PORTABLE CHEST 1 VIEW COMPARISON:  Portable exam 1607 hours compared to 10/05/2020 FINDINGS: Normal heart size, mediastinal contours, and pulmonary vascularity. Minimal subsegmental atelectasis mid to lower RIGHT lung. Lungs otherwise clear. No acute infiltrate, pleural effusion, or pneumothorax. Osseous structures unremarkable. IMPRESSION: Minimal RIGHT basilar atelectasis. Electronically Signed   By: Lavonia Dana M.D.   On: 04/12/2021 16:20    Procedures .Critical Care Performed by: Hendricks Limes, PA-C Authorized by: Hendricks Limes, PA-C   Critical care provider statement:    Critical care time (minutes):  60   Critical care time was exclusive of:  Separately billable procedures and treating other patients   Critical care was necessary to treat or prevent imminent or life-threatening deterioration of the following conditions:  Endocrine crisis   Critical care was time spent personally by me on the following activities:  Ordering and performing  treatments and interventions, ordering and review of laboratory studies, pulse oximetry, ordering and review of radiographic studies, re-evaluation of patient's condition, examination of patient, development of treatment plan with patient or surrogate and review of old charts   I assumed direction of critical care for this patient from another provider in my specialty: no     Care discussed with: admitting provider     Medications Ordered in ED Medications  dextrose 10 % infusion ( Intravenous Rate/Dose Change 04/12/21 1834)  sodium chloride 0.9 % bolus 1,000 mL (has no administration in time range)  dextrose 50 % solution 50 mL (50 mLs Intravenous Given 04/12/21 1605)    ED Course  I have reviewed the triage vital signs and the nursing notes.  Pertinent labs & imaging results that were available during my care of the patient were reviewed by me and considered in my medical decision making (see chart for details).  Clinical Course as of 04/12/21 1918  Sun Apr 12, 2021  7096 I spoke with Dr. Bridgett Larsson who agrees to admit the patient. Will add on insulin levels to further evaluate for insulinoma.  [CF]    Clinical Course User Index [CF] Hendricks Limes, PA-C   MDM Rules/Calculators/A&P                          Gregory Cowans Keath Matera. is a 75 y.o. male who presents to the emergency department with hypoglycemia.  Patient had a point-of-care glucose of 30 at the bedside.  Patient is confused and disoriented.  Immediately gave patient juice and food.  We will also give him D50 and start him on a D10 drip given that his hypoglycemia has been refractory.  With the abrasions of unknown origin and with the nephew stating that he did have a fall we will scan his head and neck in addition to his chest and pelvis.  We will also get basic labs, CK level to evaluate for rhabdo, type and screen as he has a history of anemia.  CBC showed chronic ongoing anemia but improved from 2 months ago.  CMP showed  elevated creatinine but improved from his baseline.  Also showed increased AST.  CK level is about 3000.  I gave him a liter of fluid bolus flu and COVID are negative.  Patient is O+.  Imaging of the cervical spine, head, pelvis, and chest were all negative.  There was a good response with this his blood sugars after D50 and on the D10 drip 125 cc an hour patient has since began to drop again increased rate to 150.  On reevaluation, patient's mental status did improve and he was eating comfortably.  Given clinical scenario, I do believe he would benefit from further evaluation in the hospital.  Currently he is in no acute distress.  I will admit to the hospitalist.     Final Clinical Impression(s) / ED Diagnoses Final diagnoses:  Hypoglycemia    Rx / DC Orders ED Discharge Orders     None        Cherrie Gauze 04/12/21 1918    Drenda Freeze, MD 04/12/21 2340

## 2021-04-12 NOTE — Assessment & Plan Note (Addendum)
Recurrent hypoglycemia (seen night before admission by EMS for hypoglycemia as well) A1c 4.5 - D 10 stopped on 12/26- maintaining normal sugars He's on metformin at home which is on hold- will need to dc completely this as A1c is low Denies other meds -> Dr Marcelline Deist requested family to bring in meds, glimeperide was in pill bottle that they brought in, unclear if this was being put in his pillbox (family that fills this wasn't available).  Dr Marcelline Deist discussed with niece that he will throw the med away.

## 2021-04-12 NOTE — Subjective & Objective (Signed)
Chief complaint: Altered mental status, found on the floor History present illness: 75 year old African-American male with a history of hypertension, type 2 diabetes, CKD stage III presents the ER today with hyperglycemia.  Patient found on the floor by his nephew.  Unknown the last time he was seen by [facility] person.  He arrived by EMS.  In EMS, blood sugar was 55.  Patient given D10 by EMS.  Despite multiple boluses of IV dextrose, patient remained hypoglycemic.  Patient started on a D10 drip.  [facility] hospitalist contacted for admission.

## 2021-04-12 NOTE — Assessment & Plan Note (Signed)
Continue keppra. Avoid hypoglycemia.

## 2021-04-12 NOTE — Assessment & Plan Note (Addendum)
Max CK 2,706 Trending down - Cr stable

## 2021-04-12 NOTE — ED Triage Notes (Signed)
Pt here from home with c/o hypoglycemia , cbg 55 on arrival , given D 10 by ems cbg 155 , was given d 10 last night by ems also , pt did not eat last night or this morning

## 2021-04-13 ENCOUNTER — Other Ambulatory Visit: Payer: Self-pay

## 2021-04-13 ENCOUNTER — Inpatient Hospital Stay (HOSPITAL_COMMUNITY): Payer: Medicare Other

## 2021-04-13 DIAGNOSIS — E1140 Type 2 diabetes mellitus with diabetic neuropathy, unspecified: Secondary | ICD-10-CM | POA: Diagnosis present

## 2021-04-13 DIAGNOSIS — F1011 Alcohol abuse, in remission: Secondary | ICD-10-CM

## 2021-04-13 DIAGNOSIS — E785 Hyperlipidemia, unspecified: Secondary | ICD-10-CM | POA: Diagnosis present

## 2021-04-13 DIAGNOSIS — R296 Repeated falls: Secondary | ICD-10-CM | POA: Diagnosis present

## 2021-04-13 DIAGNOSIS — E1169 Type 2 diabetes mellitus with other specified complication: Secondary | ICD-10-CM | POA: Diagnosis present

## 2021-04-13 DIAGNOSIS — J449 Chronic obstructive pulmonary disease, unspecified: Secondary | ICD-10-CM | POA: Diagnosis present

## 2021-04-13 DIAGNOSIS — Z7982 Long term (current) use of aspirin: Secondary | ICD-10-CM | POA: Diagnosis not present

## 2021-04-13 DIAGNOSIS — K219 Gastro-esophageal reflux disease without esophagitis: Secondary | ICD-10-CM | POA: Diagnosis present

## 2021-04-13 DIAGNOSIS — Z20822 Contact with and (suspected) exposure to covid-19: Secondary | ICD-10-CM | POA: Diagnosis present

## 2021-04-13 DIAGNOSIS — Z7984 Long term (current) use of oral hypoglycemic drugs: Secondary | ICD-10-CM | POA: Diagnosis not present

## 2021-04-13 DIAGNOSIS — Z880 Allergy status to penicillin: Secondary | ICD-10-CM | POA: Diagnosis not present

## 2021-04-13 DIAGNOSIS — F17210 Nicotine dependence, cigarettes, uncomplicated: Secondary | ICD-10-CM | POA: Diagnosis present

## 2021-04-13 DIAGNOSIS — G9341 Metabolic encephalopathy: Secondary | ICD-10-CM | POA: Diagnosis not present

## 2021-04-13 DIAGNOSIS — M6282 Rhabdomyolysis: Secondary | ICD-10-CM | POA: Diagnosis present

## 2021-04-13 DIAGNOSIS — Z8249 Family history of ischemic heart disease and other diseases of the circulatory system: Secondary | ICD-10-CM | POA: Diagnosis not present

## 2021-04-13 DIAGNOSIS — Z8673 Personal history of transient ischemic attack (TIA), and cerebral infarction without residual deficits: Secondary | ICD-10-CM | POA: Diagnosis not present

## 2021-04-13 DIAGNOSIS — E1122 Type 2 diabetes mellitus with diabetic chronic kidney disease: Secondary | ICD-10-CM | POA: Diagnosis present

## 2021-04-13 DIAGNOSIS — N1831 Chronic kidney disease, stage 3a: Secondary | ICD-10-CM | POA: Diagnosis present

## 2021-04-13 DIAGNOSIS — Z79899 Other long term (current) drug therapy: Secondary | ICD-10-CM | POA: Diagnosis not present

## 2021-04-13 DIAGNOSIS — I152 Hypertension secondary to endocrine disorders: Secondary | ICD-10-CM | POA: Diagnosis present

## 2021-04-13 DIAGNOSIS — G40909 Epilepsy, unspecified, not intractable, without status epilepticus: Secondary | ICD-10-CM | POA: Diagnosis present

## 2021-04-13 DIAGNOSIS — E11649 Type 2 diabetes mellitus with hypoglycemia without coma: Secondary | ICD-10-CM | POA: Diagnosis not present

## 2021-04-13 DIAGNOSIS — E113293 Type 2 diabetes mellitus with mild nonproliferative diabetic retinopathy without macular edema, bilateral: Secondary | ICD-10-CM | POA: Diagnosis present

## 2021-04-13 DIAGNOSIS — D631 Anemia in chronic kidney disease: Secondary | ICD-10-CM | POA: Diagnosis present

## 2021-04-13 DIAGNOSIS — Z8546 Personal history of malignant neoplasm of prostate: Secondary | ICD-10-CM | POA: Diagnosis not present

## 2021-04-13 LAB — CBC WITH DIFFERENTIAL/PLATELET
Abs Immature Granulocytes: 0.01 10*3/uL (ref 0.00–0.07)
Basophils Absolute: 0 10*3/uL (ref 0.0–0.1)
Basophils Relative: 0 %
Eosinophils Absolute: 0.1 10*3/uL (ref 0.0–0.5)
Eosinophils Relative: 2 %
HCT: 24.9 % — ABNORMAL LOW (ref 39.0–52.0)
Hemoglobin: 8.2 g/dL — ABNORMAL LOW (ref 13.0–17.0)
Immature Granulocytes: 0 %
Lymphocytes Relative: 26 %
Lymphs Abs: 1.4 10*3/uL (ref 0.7–4.0)
MCH: 31.7 pg (ref 26.0–34.0)
MCHC: 32.9 g/dL (ref 30.0–36.0)
MCV: 96.1 fL (ref 80.0–100.0)
Monocytes Absolute: 0.7 10*3/uL (ref 0.1–1.0)
Monocytes Relative: 12 %
Neutro Abs: 3.3 10*3/uL (ref 1.7–7.7)
Neutrophils Relative %: 60 %
Platelets: 165 10*3/uL (ref 150–400)
RBC: 2.59 MIL/uL — ABNORMAL LOW (ref 4.22–5.81)
RDW: 13.1 % (ref 11.5–15.5)
WBC: 5.4 10*3/uL (ref 4.0–10.5)
nRBC: 0 % (ref 0.0–0.2)

## 2021-04-13 LAB — GLUCOSE, CAPILLARY
Glucose-Capillary: 105 mg/dL — ABNORMAL HIGH (ref 70–99)
Glucose-Capillary: 114 mg/dL — ABNORMAL HIGH (ref 70–99)
Glucose-Capillary: 123 mg/dL — ABNORMAL HIGH (ref 70–99)
Glucose-Capillary: 124 mg/dL — ABNORMAL HIGH (ref 70–99)
Glucose-Capillary: 127 mg/dL — ABNORMAL HIGH (ref 70–99)
Glucose-Capillary: 157 mg/dL — ABNORMAL HIGH (ref 70–99)
Glucose-Capillary: 187 mg/dL — ABNORMAL HIGH (ref 70–99)
Glucose-Capillary: 78 mg/dL (ref 70–99)
Glucose-Capillary: 78 mg/dL (ref 70–99)

## 2021-04-13 LAB — BLOOD GAS, VENOUS
Acid-base deficit: 2 mmol/L (ref 0.0–2.0)
Bicarbonate: 22.1 mmol/L (ref 20.0–28.0)
O2 Saturation: 88.6 %
Patient temperature: 98.6
pCO2, Ven: 37.1 mmHg — ABNORMAL LOW (ref 44.0–60.0)
pH, Ven: 7.393 (ref 7.250–7.430)
pO2, Ven: 61.6 mmHg — ABNORMAL HIGH (ref 32.0–45.0)

## 2021-04-13 LAB — COMPREHENSIVE METABOLIC PANEL
ALT: 42 U/L (ref 0–44)
AST: 95 U/L — ABNORMAL HIGH (ref 15–41)
Albumin: 2.8 g/dL — ABNORMAL LOW (ref 3.5–5.0)
Alkaline Phosphatase: 44 U/L (ref 38–126)
Anion gap: 6 (ref 5–15)
BUN: 23 mg/dL (ref 8–23)
CO2: 22 mmol/L (ref 22–32)
Calcium: 8.1 mg/dL — ABNORMAL LOW (ref 8.9–10.3)
Chloride: 111 mmol/L (ref 98–111)
Creatinine, Ser: 1.59 mg/dL — ABNORMAL HIGH (ref 0.61–1.24)
GFR, Estimated: 46 mL/min — ABNORMAL LOW (ref >60)
Glucose, Bld: 83 mg/dL (ref 70–99)
Potassium: 3.2 mmol/L — ABNORMAL LOW (ref 3.5–5.1)
Sodium: 139 mmol/L (ref 135–145)
Total Bilirubin: 0.5 mg/dL (ref 0.3–1.2)
Total Protein: 6.7 g/dL (ref 6.5–8.1)

## 2021-04-13 LAB — MAGNESIUM: Magnesium: 2 mg/dL (ref 1.7–2.4)

## 2021-04-13 LAB — FOLATE: Folate: 15.7 ng/mL (ref >5.9)

## 2021-04-13 LAB — HEMOGLOBIN A1C
Hgb A1c MFr Bld: 4.5 % — ABNORMAL LOW (ref 4.8–5.6)
Mean Plasma Glucose: 82.45 mg/dL

## 2021-04-13 LAB — TSH: TSH: 3.758 u[IU]/mL (ref 0.350–4.500)

## 2021-04-13 LAB — CK
Total CK: 2706 U/L — ABNORMAL HIGH (ref 49–397)
Total CK: 1994 U/L — ABNORMAL HIGH (ref 49–397)

## 2021-04-13 LAB — VITAMIN B12: Vitamin B-12: 546 pg/mL (ref 180–914)

## 2021-04-13 LAB — GAMMA GT: GGT: 25 U/L (ref 7–50)

## 2021-04-13 MED ORDER — THIAMINE HCL 100 MG/ML IJ SOLN
500.0000 mg | Freq: Three times a day (TID) | INTRAVENOUS | Status: AC
  Administered 2021-04-13 – 2021-04-16 (×9): 500 mg via INTRAVENOUS
  Filled 2021-04-13 (×9): qty 5

## 2021-04-13 MED ORDER — THIAMINE HCL 100 MG/ML IJ SOLN
250.0000 mg | Freq: Every day | INTRAVENOUS | Status: DC
  Administered 2021-04-16: 14:00:00 250 mg via INTRAVENOUS
  Filled 2021-04-13: qty 2.5

## 2021-04-13 MED ORDER — LACTATED RINGERS IV SOLN: INTRAVENOUS | Status: DC

## 2021-04-13 MED ORDER — DEXTROSE IN LACTATED RINGERS 5 % IV SOLN: INTRAVENOUS | Status: DC

## 2021-04-13 MED ORDER — THIAMINE HCL 100 MG/ML IJ SOLN
100.0000 mg | Freq: Every day | INTRAMUSCULAR | Status: DC
Start: 2021-04-21 — End: 2021-04-16

## 2021-04-13 NOTE — Progress Notes (Signed)
°  Transition of Care [facility]) Screening Note   Patient Details  Name: Gregory Nunez. Date of Birth: November 12, 1945   Transition of Care [facility]) CM/SW Contact:    Lennart Pall, LCSW Phone Number: 04/13/2021, 11:31 AM    Transition of Care Department [facility]) has reviewed patient and no TOC needs have been identified at this time. We will continue to monitor patient advancement through interdisciplinary progression rounds. If new patient transition needs arise, please place a TOC consult.   Cleona Doubleday, LCSW

## 2021-04-13 NOTE — Plan of Care (Signed)
Discussed with patient plan of care for the evening, pain management and admission questions with some teach back displayed.  Also, discussed every 1 hour blood sugar checks due to hypoglycemia.   Problem: Education: Goal: Knowledge of General Education information will improve Description: Including pain rating scale, medication(s)/side effects and non-pharmacologic comfort measures Outcome: Progressing   Problem: Pain Managment: Goal: General experience of comfort will improve Outcome: Progressing

## 2021-04-13 NOTE — Hospital Course (Addendum)
75 yo with hx HTN, T2DM, CKD IIIa who lives alone in an apartment and presented via EMS after he was found on the floor.   On the day prior to admission he was seen by EMS for hypoglycemia and treated and thought to be well enough to not to have to be taken to the hospital.  His nephew called the the patient next morning and there was no answer. He was found down on the ground in the afternoon.  CBG in the ED was 55. He was admitted for hypoglycemia which has resolved.  Family brought in pill bottles and Glimiperide was found in one of them. He remains intermittently confused in the hospital. CT head and MRI unrevealing.

## 2021-04-13 NOTE — Assessment & Plan Note (Addendum)
-   He remains disoriened to place, time and situation - fell out of bed again this AM - Head CT on admission without acute finding - MRI brain - mild motion degraded, no acute abnormality, has advanced chronic small vessel ischemic disease  VBG without hypercarbia TSH wnl, folate wnl, b12 wnl Follow vitamin b1 level On high dose Thiamine since 12/26  due to h/o ETOH abuse Delirium precautions

## 2021-04-13 NOTE — Progress Notes (Signed)
PROGRESS NOTE    Gregory Nunez Gregory Nunez.  JEH:[id number] DOB: [birth year] DOA: 04/12/2021 PCP: Farrel Gordon, DO  No chief complaint on file.   Brief Narrative:  75 yo with hx HTN, T2DM, CKD IIIa who presented with hypoglycemia.  On the day prior to admission he was seen by EMS for hypoglycemia and treated and thought to be well enough not to have to be taken to the hospital.  His nephew called the next morning, with no answer, and found him down on the ground in the afternoon.  He's been admitted for hypoglycemia.  See below for additional details    Assessment & Plan:   Principal Problem:   Diabetic hypoglycemia ([place]) Active Problems:   Acute metabolic encephalopathy   Rhabdomyolysis   Seizure disorder ([place])   Anemia due to chronic kidney disease   Hypertension associated with diabetes ([place])   CKD (chronic kidney disease) stage 3, GFR 30-59 ml/min (HCC)   History of alcohol abuse   * Diabetic hypoglycemia (HCC) Recurrent hypoglycemia (seen night before admission by EMS for hypoglycemia as well) A1c 4.5 He's on metformin - will stop this - agree, unlikely metformin alone causing hypoglycemia Denies other meds -> requested family to bring in meds (on amaryl in past?), asked pharmacy to look at med fills  Follow insulin level, follow insulin like growth factor (? Not clear why this was drawn) Follow sulfonylurea screen If recurrent hypoglycemia -> would check glucose, insulin, c peptite, bhob, proinsulin ? If related to meds, poor PO intake --- ddx includes related to etoh, but he denies recent use for at least Latesha Chesney month Continue q4 bg checks, dextrose containing fluids  Acute metabolic encephalopathy Head CT without acute finding Follow MRI brain VBG without hypercarbia TSH wnl, folate wnl, b12 wnl Follow vitamin b1 level With hx etoh abuse, high dose thiamien Delirium precautions Treat hypoglycemia  Rhabdomyolysis Found down Trending down Follow UA Renal function  appears to be around baseline   Seizure disorder ([place]) Continue keppra. Avoid hypoglycemia.  Anemia due to chronic kidney disease Chronic, appears close to baseline, CKD IIIa  Hypertension associated with diabetes ([place])- (present on admission) Stable.  CKD (chronic kidney disease) stage 3, GFR 30-59 ml/min (HCC)- (present on admission) Chronic.  History of alcohol abuse Denies recent use      DVT prophylaxis: heparin Code Status: full Family Communication: none at bedside Disposition:   Status is: Inpatient  Remains inpatient appropriate because: hypoglycemia       Consultants:  none  Procedures:  none  Antimicrobials:  Anti-infectives (From admission, onward)    None       Subjective: No pain, denies etoh use  Objective: Vitals:   04/13/21 0500 04/13/21 0937 04/13/21 1330 04/13/21 1530  BP:  (!) 141/73 (!) 149/62 (!) 154/76  Pulse:  99 88 99  Resp:  20 18 20   Temp:  97.7 F (36.5 C) 98.1 F (36.7 C) 97.9 F (36.6 C)  TempSrc:  Oral Oral Oral  SpO2:  97% 100% 99%  Weight: 98.7 kg     Height:        Intake/Output Summary (Last 24 hours) at 04/13/2021 1713 Last data filed at 04/13/2021 1607 Gross per 24 hour  Intake 4827.98 ml  Output 600 ml  Net 4227.98 ml   Filed Weights   04/12/21 1605 04/13/21 0500  Weight: 98.7 kg 98.7 kg    Examination:  General exam: Appears calm and comfortable  Respiratory system: unlabored Cardiovascular system: RRR Gastrointestinal system:  Abdomen is nondistended, soft and nontender Central nervous system: Alert and oriented x2.  CN 2-12 intact.  Moving all extremities with equal strength.  Mildly confused.  Extremities: no LEE  Data Reviewed: I have personally reviewed following labs and imaging studies  CBC: Recent Labs  Lab 04/12/21 1700 04/13/21 0458  WBC 6.9 5.4  NEUTROABS 5.4 3.3  HGB 8.7* 8.2*  HCT 28.3* 24.9*  MCV 101.1* 96.1  PLT 175 824    Basic Metabolic Panel: Recent Labs   Lab 04/12/21 1700 04/13/21 0458  NA 140 139  K 3.6 3.2*  CL 111 111  CO2 21* 22  GLUCOSE 133* 83  BUN 30* 23  CREATININE 1.55* 1.59*  CALCIUM 8.5* 8.1*  MG  --  2.0    GFR: Estimated Creatinine Clearance: 49.6 mL/min (Ayonna Speranza) (by C-G formula based on SCr of 1.59 mg/dL (H)).  Liver Function Tests: Recent Labs  Lab 04/12/21 1700 04/13/21 0458  AST 101* 95*  ALT 44 42  ALKPHOS 46 44  BILITOT 0.4 0.5  PROT 7.4 6.7  ALBUMIN 3.2* 2.8*    CBG: Recent Labs  Lab 04/13/21 0354 04/13/21 0457 04/13/21 0745 04/13/21 1021 04/13/21 1217  GLUCAP 105* 78 127* 114* 124*     Recent Results (from the past 240 hour(s))  Resp Panel by RT-PCR (Flu Francess Mullen&B, Covid) Nasopharyngeal Swab     Status: None   Collection Time: 04/12/21  3:57 PM   Specimen: Nasopharyngeal Swab; Nasopharyngeal(NP) swabs in vial transport medium  Result Value Ref Range Status   SARS Coronavirus 2 by RT PCR NEGATIVE NEGATIVE Final    Comment: (NOTE) SARS-CoV-2 target nucleic acids are NOT DETECTED.  The SARS-CoV-2 RNA is generally detectable in upper respiratory specimens during the acute phase of infection. The lowest concentration of SARS-CoV-2 viral copies this assay can detect is 138 copies/mL. Jaylenn Altier negative result does not preclude SARS-Cov-2 infection and should not be used as the sole basis for treatment or other patient management decisions. Devun Anna negative result may occur with  improper specimen collection/handling, submission of specimen other than nasopharyngeal swab, presence of viral mutation(s) within the areas targeted by this assay, and inadequate number of viral copies(<138 copies/mL). Zowie Lundahl negative result must be combined with clinical observations, patient history, and epidemiological information. The expected result is Negative.  Fact Sheet for Patients:  EntrepreneurPulse.com.au  Fact Sheet for Healthcare Providers:  IncredibleEmployment.be  This test is no t  yet approved or cleared by the [place] FDA and  has been authorized for detection and/or diagnosis of SARS-CoV-2 by FDA under an Emergency Use Authorization (EUA). This EUA will remain  in effect (meaning this test can be used) for the duration of the COVID-19 declaration under Section 564(b)(1) of the Act, 21 U.S.C.section 360bbb-3(b)(1), unless the authorization is terminated  or revoked sooner.       Influenza Patsy Zaragoza by PCR NEGATIVE NEGATIVE Final   Influenza B by PCR NEGATIVE NEGATIVE Final    Comment: (NOTE) The Xpert Xpress SARS-CoV-2/FLU/RSV plus assay is intended as an aid in the diagnosis of influenza from Nasopharyngeal swab specimens and should not be used as Andora Krull sole basis for treatment. Nasal washings and aspirates are unacceptable for Xpert Xpress SARS-CoV-2/FLU/RSV testing.  Fact Sheet for Patients: EntrepreneurPulse.com.au  Fact Sheet for Healthcare Providers: IncredibleEmployment.be  This test is not yet approved or cleared by the [place] FDA and has been authorized for detection and/or diagnosis of SARS-CoV-2 by FDA under an Emergency Use Authorization (EUA). This EUA will  remain in effect (meaning this test can be used) for the duration of the COVID-19 declaration under Section 564(b)(1) of the Act, 21 U.S.C. section 360bbb-3(b)(1), unless the authorization is terminated or revoked.  Performed at [facility], [place] [address] [place], [place] [address]          Radiology Studies: CT HEAD WO CONTRAST (5MM)  Result Date: 04/12/2021 CLINICAL DATA:  [place] with trauma to the head and neck. EXAM: CT HEAD WITHOUT CONTRAST TECHNIQUE: Contiguous axial images were obtained from the base of the skull through the vertex without intravenous contrast. COMPARISON:  10/22/2020 FINDINGS: Brain: Old small vessel ischemic changes affect the pons. No focal cerebellar finding. Cerebral hemispheres show chronic  small-vessel ischemic changes of the white matter and old infarction in the left basal ganglia and radiating white matter tracts. No acute infarction, mass lesion, hemorrhage, hydrocephalus or extra-axial collection. Vascular: There is atherosclerotic calcification of the major vessels at the base of the brain. Skull: No skull fracture. Sinuses/Orbits: Clear/normal Other: None IMPRESSION: No acute or traumatic CT finding. Chronic small-vessel ischemic changes of the brainstem and cerebral hemispheric white matter. 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 Electronically Signed   By: Nelson Chimes M.D.   On: 04/12/2021 17:11   CT Cervical Spine Wo Contrast  Result Date: 04/12/2021 CLINICAL DATA:  Ataxia.  Trauma to the head and neck. EXAM: CT CERVICAL SPINE WITHOUT CONTRAST TECHNIQUE: Multidetector CT imaging of the cervical spine was performed without intravenous contrast. Multiplanar CT image reconstructions were also generated. COMPARISON:  Radiography 11/11/2015 FINDINGS: Alignment: Mild curvature convex to the right. Straightening of the normal cervical lordosis. No traumatic malalignment. Skull base and vertebrae: No evidence of regional fracture. Prominent anterior osteophytes in the cervical region which can in certain patients be associated with dysphagia. Soft tissues and spinal canal: Negative Disc levels: The foramen magnum is widely patent. There is ordinary mild osteoarthritis of the C1-2 articulation but no encroachment upon the neural structures. C2-3: Bilateral uncovertebral osteophytes. Foraminal narrowing left worse than right. C3-4: Endplate osteophytes and chronic disc protrusion. Uncovertebral hypertrophy. Moderate canal and foraminal stenosis. C4-5: Bilateral uncovertebral prominence with bilateral foraminal narrowing. C5-6: Mild bulging of the disc.  No significant stenosis. C6-7: Mild bulging of the disc.  No significant stenosis. C7-T1: Mild bulging of  the disc.  No significant stenosis. Upper chest: Negative Other: None IMPRESSION: No acute or traumatic finding. Chronic degenerative changes throughout the neck as outlined above. Stenosis of the canal and foramina at C3-4 that could be significant. Foraminal stenosis bilateral at C4-5 and left worse than right at C2-3. Electronically Signed   By: Nelson Chimes M.D.   On: 04/12/2021 17:14   DG Pelvis Portable  Result Date: 04/12/2021 CLINICAL DATA:  Hyperglycemia, fall EXAM: PORTABLE PELVIS 1-2 VIEWS COMPARISON:  Portable exam 1608 hours without priors for comparison FINDINGS: Mild osseous demineralization. Hip and SI joint spaces preserved. No acute fracture, dislocation, or bone destruction. IMPRESSION: No acute osseous abnormalities. Electronically Signed   By: Lavonia Dana M.D.   On: 04/12/2021 16:21   DG Chest Port 1 View  Result Date: 04/12/2021 CLINICAL DATA:  Fall, hypoglycemia EXAM: PORTABLE CHEST 1 VIEW COMPARISON:  Portable exam 1607 hours compared to 10/05/2020 FINDINGS: Normal heart size, mediastinal contours, and pulmonary vascularity. Minimal subsegmental atelectasis mid to lower RIGHT lung. Lungs otherwise clear. No acute infiltrate, pleural effusion, or pneumothorax. Osseous structures unremarkable. IMPRESSION: Minimal RIGHT basilar atelectasis. Electronically Signed  By: Lavonia Dana M.D.   On: 04/12/2021 16:20        Scheduled Meds:  amLODipine  10 mg Oral Daily   heparin  5,000 Units Subcutaneous Q8H   levETIRAcetam  500 mg Oral BID   pantoprazole  40 mg Oral Daily   [START ON 04/21/2021] thiamine injection  100 mg Intravenous Daily   Continuous Infusions:  dextrose 50 mL/hr at 04/13/21 1607   dextrose 5% lactated ringers Stopped (04/13/21 1550)   thiamine injection 100 mL/hr at 04/13/21 1607   Followed by   Derrill Memo ON 04/16/2021] thiamine injection       LOS: 0 days    Time spent: over 30 min    Fayrene Helper, MD [facility]   To contact the  attending provider between 7A-7P or the covering provider during after hours 7P-7A, please log into the web site [email or web address] and access using universal [place] password for that web site. If you do not have the password, please call the hospital operator.  04/13/2021, 5:13 PM

## 2021-04-13 NOTE — Assessment & Plan Note (Signed)
Denies recent use.

## 2021-04-14 LAB — CBC WITH DIFFERENTIAL/PLATELET
Abs Immature Granulocytes: 0.01 10*3/uL (ref 0.00–0.07)
Basophils Absolute: 0 10*3/uL (ref 0.0–0.1)
Basophils Relative: 0 %
Eosinophils Absolute: 0.1 10*3/uL (ref 0.0–0.5)
Eosinophils Relative: 2 %
HCT: 23.6 % — ABNORMAL LOW (ref 39.0–52.0)
Hemoglobin: 7.8 g/dL — ABNORMAL LOW (ref 13.0–17.0)
Immature Granulocytes: 0 %
Lymphocytes Relative: 25 %
Lymphs Abs: 1 10*3/uL (ref 0.7–4.0)
MCH: 31.7 pg (ref 26.0–34.0)
MCHC: 33.1 g/dL (ref 30.0–36.0)
MCV: 95.9 fL (ref 80.0–100.0)
Monocytes Absolute: 0.5 10*3/uL (ref 0.1–1.0)
Monocytes Relative: 13 %
Neutro Abs: 2.3 10*3/uL (ref 1.7–7.7)
Neutrophils Relative %: 60 %
Platelets: 158 10*3/uL (ref 150–400)
RBC: 2.46 MIL/uL — ABNORMAL LOW (ref 4.22–5.81)
RDW: 12.8 % (ref 11.5–15.5)
WBC: 3.9 10*3/uL — ABNORMAL LOW (ref 4.0–10.5)
nRBC: 0 % (ref 0.0–0.2)

## 2021-04-14 LAB — CK: Total CK: 959 U/L — ABNORMAL HIGH (ref 49–397)

## 2021-04-14 LAB — COMPREHENSIVE METABOLIC PANEL
ALT: 38 U/L (ref 0–44)
AST: 70 U/L — ABNORMAL HIGH (ref 15–41)
Albumin: 2.7 g/dL — ABNORMAL LOW (ref 3.5–5.0)
Alkaline Phosphatase: 43 U/L (ref 38–126)
Anion gap: 5 (ref 5–15)
BUN: 22 mg/dL (ref 8–23)
CO2: 21 mmol/L — ABNORMAL LOW (ref 22–32)
Calcium: 8 mg/dL — ABNORMAL LOW (ref 8.9–10.3)
Chloride: 111 mmol/L (ref 98–111)
Creatinine, Ser: 1.65 mg/dL — ABNORMAL HIGH (ref 0.61–1.24)
GFR, Estimated: 44 mL/min — ABNORMAL LOW (ref >60)
Glucose, Bld: 253 mg/dL — ABNORMAL HIGH (ref 70–99)
Potassium: 3.5 mmol/L (ref 3.5–5.1)
Sodium: 137 mmol/L (ref 135–145)
Total Bilirubin: 0.4 mg/dL (ref 0.3–1.2)
Total Protein: 6.4 g/dL — ABNORMAL LOW (ref 6.5–8.1)

## 2021-04-14 LAB — GLUCOSE, CAPILLARY
Glucose-Capillary: 230 mg/dL — ABNORMAL HIGH (ref 70–99)
Glucose-Capillary: 240 mg/dL — ABNORMAL HIGH (ref 70–99)
Glucose-Capillary: 181 mg/dL — ABNORMAL HIGH (ref 70–99)
Glucose-Capillary: 129 mg/dL — ABNORMAL HIGH (ref 70–99)
Glucose-Capillary: 161 mg/dL — ABNORMAL HIGH (ref 70–99)
Glucose-Capillary: 163 mg/dL — ABNORMAL HIGH (ref 70–99)
Glucose-Capillary: 140 mg/dL — ABNORMAL HIGH (ref 70–99)

## 2021-04-14 LAB — PHOSPHORUS: Phosphorus: 3.2 mg/dL (ref 2.5–4.6)

## 2021-04-14 LAB — MAGNESIUM: Magnesium: 1.8 mg/dL (ref 1.7–2.4)

## 2021-04-14 LAB — INSULIN, RANDOM: Insulin: 12.3 u[IU]/mL (ref 2.6–24.9)

## 2021-04-14 MED ORDER — LACTATED RINGERS IV SOLN: INTRAVENOUS | Status: AC

## 2021-04-14 NOTE — Evaluation (Signed)
Physical Therapy Evaluation Patient Details Name: Gregory Nunez. MRN: [id number] DOB: 1945-12-30 Today's Date: 04/14/2021  History of Present Illness  75 yo with hx HTN, T2DM, CKD, ETOH, prostate CA, HTN  who presented 04/12/21 with hypoglycemia.  He was seen by EMS for hypoglycemia and treated at home on 12/24. Nephew called the next morning, with no answer, and found him down on the ground in the afternoon.  He's been admitted for hypoglycemia.  Clinical Impression  Patient with noted multiple bruises and abrasions, indicating falls. \Patient's speech is slurred. Patient not oriented to place and time.   Patient reports  lives alone in [place]( on [address], ([place] address is  [address] patient reports nephew  assists , unsure how much for ADL's and meals.  Patient clearly  requires 24/7 assistance/ SNF if family not available.   Pt admitted with above diagnosis.  Pt currently with functional limitations due to the deficits listed below (see PT Problem List). Pt will benefit from skilled PT to increase their independence and safety with mobility to allow discharge to the venue listed below.        Recommendations for follow up therapy are one component of a multi-disciplinary discharge planning process, led by the attending physician.  Recommendations may be updated based on patient status, additional functional criteria and insurance authorization.  Follow Up Recommendations Skilled nursing-short term rehab (<3 hours/day)    Assistance Recommended at Discharge Frequent or constant Supervision/Assistance  Functional Status Assessment Patient has had a recent decline in their functional status and demonstrates the ability to make significant improvements in function in a reasonable and predictable amount of time.  Equipment Recommendations  None recommended by PT    Recommendations for Other Services       Precautions / Restrictions Precautions Precautions:  Fall Precaution Comments: apparantly multiple , multiple abrasions and bruises. on knees, trunk,  right side o forehead.      Mobility  Bed Mobility Overal bed mobility: Needs Assistance Bed Mobility: Supine to Sit     Supine to sit: Min assist     General bed mobility comments: multimodal cues to get legs around and to scoot to bed edge    Transfers Overall transfer level: Needs assistance Equipment used: Rolling walker (2 wheels) Transfers: Sit to/from Stand;Bed to chair/wheelchair/BSC Sit to Stand: Mod assist   Step pivot transfers: Mod assist       General transfer comment: cues to power up, steady assist to stand with Rw, , trunk swaying. Small shuffle steps to get to recliner, using RW. Steady support for balance. . Stood from recliner  with min assistance using Rw, cues to reach back to chair, pt. did not perform.    Ambulation/Gait                  Stairs            Wheelchair Mobility    Modified Rankin (Stroke Patients Only)       Balance Overall balance assessment: Needs assistance;History of Falls Sitting-balance support: Feet supported;Bilateral upper extremity supported Sitting balance-Leahy Scale: Fair     Standing balance support: During functional activity;Bilateral upper extremity supported;Reliant on assistive device for balance   Standing balance comment: reliant on Rw and support                             Pertinent Vitals/Pain Pain Assessment: No/denies pain  Home Living Family/patient expects to be discharged to:: Private residence Living Arrangements: Alone Available Help at Discharge: Available PRN/intermittently Type of Home: Apartment Home Access: Economy: One level Home Equipment: Conservation officer, nature (2 wheels);Shower seat;Cane - single point Additional Comments: patient poor historian, states he [address]s on [address], does have an Media planner.    Prior Function Prior Level of  Function : Needs assist;Patient poor historian/Family not available  Cognitive Assist : Mobility (cognitive)     Physical Assist : Mobility (physical) Mobility (physical): Gait   Mobility Comments: unsure of support, if he has assiststance for ADL's safety ADLs Comments: pt. states he gets his meals, does not specify.     Hand Dominance   Dominant Hand: Right    Extremity/Trunk Assessment   Upper Extremity Assessment Upper Extremity Assessment: Generalized weakness (slow movements/sluggish)    Lower Extremity Assessment Lower Extremity Assessment: Generalized weakness    Cervical / Trunk Assessment Cervical / Trunk Assessment: Kyphotic  Communication   Communication: Other (comment) (dysrthric)  Cognition Arousal/Alertness: Awake/alert Behavior During Therapy: WFL for tasks assessed/performed Overall Cognitive Status: No family/caregiver present to determine baseline cognitive functioning Area of Impairment: Orientation;Attention;Memory;Following commands;Safety/judgement;Awareness                 Orientation Level: Place;Time;Situation Current Attention Level: Sustained Memory: Decreased short-term memory Following Commands: Follows one step commands inconsistently Safety/Judgement: Decreased awareness of deficits Awareness: Emergent   General Comments: pt. not clear on assistnace levle, States his nephew assists,        General Comments      Exercises     Assessment/Plan    PT Assessment Patient needs continued PT services  PT Problem List Decreased strength;Decreased mobility;Decreased safety awareness;Decreased knowledge of precautions;Decreased activity tolerance;Decreased cognition;Decreased balance       PT Treatment Interventions DME instruction;Therapeutic activities;Gait training;Therapeutic exercise;Patient/family education;Functional mobility training;Balance training    PT Goals (Current goals can be found in the Care Plan section)   Acute Rehab PT Goals Patient Stated Goal: agreed that he needs rehab PT Goal Formulation: With patient Time For Goal Achievement: 04/28/21 Potential to Achieve Goals: Fair    Frequency Min 2X/week   Barriers to discharge Decreased caregiver support      Co-evaluation               AM-PAC PT "6 Clicks" Mobility  Outcome Measure Help needed turning from your back to your side while in a flat bed without using bedrails?: A Lot Help needed moving from lying on your back to sitting on the side of a flat bed without using bedrails?: A Lot Help needed moving to and from a bed to a chair (including a wheelchair)?: A Lot Help needed standing up from a chair using your arms (e.g., wheelchair or bedside chair)?: A Lot Help needed to walk in hospital room?: Total Help needed climbing 3-5 steps with a railing? : Total 6 Click Score: 10    End of Session Equipment Utilized During Treatment: Gait belt Activity Tolerance: Patient tolerated treatment well Patient left: in chair;with call bell/phone within reach;with chair alarm set;with nursing/sitter in room Nurse Communication: Mobility status PT Visit Diagnosis: Unsteadiness on feet (R26.81);History of falling (Z91.81);Repeated falls (R29.6)    Time: 1287-8676 PT Time Calculation (min) (ACUTE ONLY): 23 min   Charges:   PT Evaluation $PT Eval Low Complexity: 1 Low PT Treatments $Therapeutic Activity: 8-22 mins        Tresa Endo PT Acute Rehabilitation  Services Pager [phone] Office [phone]   Claretha Cooper 04/14/2021, 11:52 AM

## 2021-04-14 NOTE — Progress Notes (Addendum)
PROGRESS NOTE    Gregory Nunez Gregory Nunez.  WNI:[id number] DOB: [birth year] DOA: 04/12/2021 PCP: Farrel Gordon, DO  No chief complaint on file.   Brief Narrative:  75 yo with hx HTN, T2DM, CKD IIIa who presented with hypoglycemia.  On the day prior to admission he was seen by EMS for hypoglycemia and treated and thought to be well enough not to have to be taken to the hospital.  His nephew called the next morning, with no answer, and found him down on the ground in the afternoon.  He's been admitted for hypoglycemia.  Workup for hypoglycemia and encephalopathy currently ongoing.  Hypoglycemia improved, at this time, if does ok without dextrose containing fluids for 24 hrs without hypoglycemia, I think he'd be ready for discharge.  Will need snf.    See below for additional details    Assessment & Plan:   Principal Problem:   Diabetic hypoglycemia ([place]) Active Problems:   Acute metabolic encephalopathy   Rhabdomyolysis   Seizure disorder ([place])   Anemia due to chronic kidney disease   Hypertension associated with diabetes ([place])   CKD (chronic kidney disease) stage 3, GFR 30-59 ml/min (HCC)   History of alcohol abuse   * Diabetic hypoglycemia (HCC) Recurrent hypoglycemia (seen night before admission by EMS for hypoglycemia as well) A1c 4.5 He's on metformin - will stop this - agree, unlikely metformin alone causing hypoglycemia Denies other meds -> requested family to bring in meds (on amaryl in past?), asked pharmacy to look at med fills  Follow insulin level (wnl), follow insulin like growth factor (? Not clear why this was drawn) Follow sulfonylurea screen If recurrent hypoglycemia -> would check glucose, insulin, c peptite, bhob, proinsulin ? If related to meds, poor PO intake --- ddx includes related to etoh (denies recent use) or other causes Continue q4 bg checks, dextrose containing fluids  Addendum: glimeperide was in pill bottle that they brought in, unclear if this was  being put in his pillbox (family that fills this wasn't available).  Will throw away this med (discussed with niece).  The rest of meds send down to pharmacy.  Follow sulfonylurea screen.  Acute metabolic encephalopathy Head CT without acute finding Follow MRI brain - motion degraded, no acute abnormality (see report - chronic small vessel ischemic disease, etc) VBG without hypercarbia TSH wnl, folate wnl, b12 wnl Follow vitamin b1 level With hx etoh abuse, high dose thiamien Delirium precautions Treat hypoglycemia He may be close to his baseline  Rhabdomyolysis Found down Trending down Follow UA Renal function appears to be around baseline   Seizure disorder ([place]) Continue keppra. Avoid hypoglycemia.  Anemia due to chronic kidney disease Chronic, appears close to baseline, CKD IIIa  Hypertension associated with diabetes ([place])- (present on admission) Stable.  CKD (chronic kidney disease) stage 3, GFR 30-59 ml/min (HCC)- (present on admission) Chronic.  History of alcohol abuse Denies recent use      DVT prophylaxis: heparin Code Status: full Family Communication: none at bedside Disposition:   Status is: Inpatient  Remains inpatient appropriate because: hypoglycemia       Consultants:  none  Procedures:  none  Antimicrobials:  Anti-infectives (From admission, onward)    None       Subjective: No complaints  Objective: Vitals:   04/14/21 0351 04/14/21 0500 04/14/21 1323 04/14/21 1945  BP: (!) 156/86  (!) 152/97 (!) 154/77  Pulse: 96   90  Resp: 20  20 20   Temp: 98.7 F (37.1 C)  98.8 F (37.1 C) 98 F (36.7 C)  TempSrc: Oral  Oral Oral  SpO2: 99%   98%  Weight:  78.9 kg    Height:        Intake/Output Summary (Last 24 hours) at 04/14/2021 2054 Last data filed at 04/14/2021 1700 Gross per 24 hour  Intake 1423.6 ml  Output 2500 ml  Net -1076.4 ml   Filed Weights   04/12/21 1605 04/13/21 0500 04/14/21 0500  Weight: 98.7 kg 98.7  kg 78.9 kg    Examination:  General: No acute distress. Cardiovascular: Heart sounds show Gregory Nunez regular rate, and rhythm.  Lungs: Clear to auscultation bilaterally Abdomen: Soft, nontender, nondistended  Neurological: Alert and oriented 3 - some trouble with month, but corrected himself - seems close to his baseline? (Asked family for help next time they see him). Moves all extremities 4 with equal strength. Cranial nerves II through XII grossly intact. Skin: Warm and dry. No rashes or lesions. Extremities: No clubbing or cyanosis. No edema.  Data Reviewed: I have personally reviewed following labs and imaging studies  CBC: Recent Labs  Lab 04/12/21 1700 04/13/21 0458 04/14/21 0456  WBC 6.9 5.4 3.9*  NEUTROABS 5.4 3.3 2.3  HGB 8.7* 8.2* 7.8*  HCT 28.3* 24.9* 23.6*  MCV 101.1* 96.1 95.9  PLT 175 165 824    Basic Metabolic Panel: Recent Labs  Lab 04/12/21 1700 04/13/21 0458 04/14/21 0456  NA 140 139 137  K 3.6 3.2* 3.5  CL 111 111 111  CO2 21* 22 21*  GLUCOSE 133* 83 253*  BUN 30* 23 22  CREATININE 1.55* 1.59* 1.65*  CALCIUM 8.5* 8.1* 8.0*  MG  --  2.0 1.8  PHOS  --   --  3.2    GFR: Estimated Creatinine Clearance: 42.5 mL/min (Gregory Nunez) (by C-G formula based on SCr of 1.65 mg/dL (H)).  Liver Function Tests: Recent Labs  Lab 04/12/21 1700 04/13/21 0458 04/14/21 0456  AST 101* 95* 70*  ALT 44 42 38  ALKPHOS 46 44 43  BILITOT 0.4 0.5 0.4  PROT 7.4 6.7 6.4*  ALBUMIN 3.2* 2.8* 2.7*    CBG: Recent Labs  Lab 04/14/21 0349 04/14/21 0742 04/14/21 1152 04/14/21 1715 04/14/21 1948  GLUCAP 240* 181* 129* 161* 163*     Recent Results (from the past 240 hour(s))  Resp Panel by RT-PCR (Flu Gregory Nunez&B, Covid) Nasopharyngeal Swab     Status: None   Collection Time: 04/12/21  3:57 PM   Specimen: Nasopharyngeal Swab; Nasopharyngeal(NP) swabs in vial transport medium  Result Value Ref Range Status   SARS Coronavirus 2 by RT PCR NEGATIVE NEGATIVE Final    Comment:  (NOTE) SARS-CoV-2 target nucleic acids are NOT DETECTED.  The SARS-CoV-2 RNA is generally detectable in upper respiratory specimens during the acute phase of infection. The lowest concentration of SARS-CoV-2 viral copies this assay can detect is 138 copies/mL. Gregory Mccleery negative result does not preclude SARS-Cov-2 infection and should not be used as the sole basis for treatment or other patient management decisions. Lataysha Vohra negative result may occur with  improper specimen collection/handling, submission of specimen other than nasopharyngeal swab, presence of viral mutation(s) within the areas targeted by this assay, and inadequate number of viral copies(<138 copies/mL). Teleah Villamar negative result must be combined with clinical observations, patient history, and epidemiological information. The expected result is Negative.  Fact Sheet for Patients:  EntrepreneurPulse.com.au  Fact Sheet for Healthcare Providers:  IncredibleEmployment.be  This test is no t yet approved or cleared by the  [place] Architectural technologist and  has been authorized for detection and/or diagnosis of SARS-CoV-2 by FDA under an Print production planner (EUA). This EUA will remain  in effect (meaning this test can be used) for the duration of the COVID-19 declaration under Section 564(b)(1) of the Act, 21 U.S.C.section 360bbb-3(b)(1), unless the authorization is terminated  or revoked sooner.       Influenza Jaliana [place] by PCR NEGATIVE NEGATIVE Final   Influenza B by PCR NEGATIVE NEGATIVE Final    Comment: (NOTE) The Xpert Xpress SARS-CoV-2/FLU/RSV plus assay is intended as an aid in the diagnosis of influenza from Nasopharyngeal swab specimens and should not be used as Jaana Brodt sole basis for treatment. Nasal washings and aspirates are unacceptable for Xpert Xpress SARS-CoV-2/FLU/RSV testing.  Fact Sheet for Patients: EntrepreneurPulse.com.au  Fact Sheet for Healthcare  Providers: IncredibleEmployment.be  This test is not yet approved or cleared by the [place] FDA and has been authorized for detection and/or diagnosis of SARS-CoV-2 by FDA under an Emergency Use Authorization (EUA). This EUA will remain in effect (meaning this test can be used) for the duration of the COVID-19 declaration under Section 564(b)(1) of the Act, 21 U.S.C. section 360bbb-3(b)(1), unless the authorization is terminated or revoked.  Performed at [facility], [place] [address], [place], [place] [address]          Radiology Studies: MR BRAIN WO CONTRAST  Result Date: 04/13/2021 CLINICAL DATA:  Provided history: Delirium. Additional history provided: Hypoglycemia, delayed speech, confusion. EXAM: MRI HEAD WITHOUT CONTRAST TECHNIQUE: Multiplanar, multiecho pulse sequences of the brain and surrounding structures were obtained without intravenous contrast. COMPARISON:  Head CT 04/12/2021. FINDINGS: Brain: Mild intermittent motion degradation. Mild generalized cerebral and cerebellar atrophy. Redemonstrated chronic small-vessel infarcts within the bilateral cerebral hemispheric white matter and left basal ganglia/posterior limb of left internal capsule. Cloud Graham chronic lacunar infarct within the high left parietal lobe white matter is new as compared to the prior MRI of 10/07/2020. Associated wallerian degeneration within the left cerebral peduncle. Background advanced patchy and ill-defined hypoattenuation within the cerebral white matter, nonspecific but compatible chronic small vessel ischemic disease. Redemonstrated chronic microhemorrhages within the left frontal and parietal lobes. Redemonstrated chronic lacunar infarcts within the bilateral pons with background pontine chronic small vessel ischemic changes. There is no acute infarct. No evidence of an intracranial mass. No extra-axial fluid collection. No midline shift. Vascular: Maintained flow  voids within the proximal large arterial vessels. Skull and upper cervical spine: No focal suspicious marrow lesion. Incompletely assessed cervical spondylosis. Sinuses/Orbits: Visualized orbits show no acute finding. Bilateral lens replacements. Mild mucosal thickening within the bilateral ethmoid sinuses. IMPRESSION: Mildly motion degraded exam. No evidence of acute intracranial abnormality. Parenchymal atrophy, advanced chronic small vessel ischemic disease and multiple chronic small-vessel infarcts, as outlined. Omelia Marquart chronic small-vessel infarct within the high left parietal lobe white matter is new from the brain MRI of 10/07/2020. These findings are otherwise unchanged from the prior MRI. Mild paranasal sinus disease, as described. Electronically Signed   By: Kellie Simmering D.O.   On: 04/13/2021 21:24        Scheduled Meds:  amLODipine  10 mg Oral Daily   heparin  5,000 Units Subcutaneous Q8H   levETIRAcetam  500 mg Oral BID   pantoprazole  40 mg Oral Daily   [START ON 04/21/2021] thiamine injection  100 mg Intravenous Daily   Continuous Infusions:  lactated ringers 100 mL/hr at 04/14/21 0911   thiamine injection 500 mg (04/14/21 1639)  Followed by   Derrill Memo ON 04/16/2021] thiamine injection       LOS: 1 day    Time spent: over 42 min    Fayrene Helper, MD [facility]   To contact the attending provider between 7A-7P or the covering provider during after hours 7P-7A, please log into the web site [email or web address] and access using universal [place] password for that web site. If you do not have the password, please call the hospital operator.  04/14/2021, 8:54 PM

## 2021-04-15 DIAGNOSIS — R5381 Other malaise: Secondary | ICD-10-CM

## 2021-04-15 DIAGNOSIS — G9341 Metabolic encephalopathy: Secondary | ICD-10-CM

## 2021-04-15 LAB — CBC WITH DIFFERENTIAL/PLATELET
Abs Immature Granulocytes: 0.01 10*3/uL (ref 0.00–0.07)
Basophils Absolute: 0 10*3/uL (ref 0.0–0.1)
Basophils Relative: 0 %
Eosinophils Absolute: 0.2 10*3/uL (ref 0.0–0.5)
Eosinophils Relative: 3 %
HCT: 23.3 % — ABNORMAL LOW (ref 39.0–52.0)
Hemoglobin: 8 g/dL — ABNORMAL LOW (ref 13.0–17.0)
Immature Granulocytes: 0 %
Lymphocytes Relative: 19 %
Lymphs Abs: 0.9 10*3/uL (ref 0.7–4.0)
MCH: 32 pg (ref 26.0–34.0)
MCHC: 34.3 g/dL (ref 30.0–36.0)
MCV: 93.2 fL (ref 80.0–100.0)
Monocytes Absolute: 0.6 10*3/uL (ref 0.1–1.0)
Monocytes Relative: 12 %
Neutro Abs: 3.1 10*3/uL (ref 1.7–7.7)
Neutrophils Relative %: 66 %
Platelets: 171 10*3/uL (ref 150–400)
RBC: 2.5 MIL/uL — ABNORMAL LOW (ref 4.22–5.81)
RDW: 12.8 % (ref 11.5–15.5)
WBC: 4.8 10*3/uL (ref 4.0–10.5)
nRBC: 0 % (ref 0.0–0.2)

## 2021-04-15 LAB — GLUCOSE, CAPILLARY
Glucose-Capillary: 116 mg/dL — ABNORMAL HIGH (ref 70–99)
Glucose-Capillary: 122 mg/dL — ABNORMAL HIGH (ref 70–99)
Glucose-Capillary: 134 mg/dL — ABNORMAL HIGH (ref 70–99)
Glucose-Capillary: 136 mg/dL — ABNORMAL HIGH (ref 70–99)
Glucose-Capillary: 138 mg/dL — ABNORMAL HIGH (ref 70–99)
Glucose-Capillary: 155 mg/dL — ABNORMAL HIGH (ref 70–99)
Glucose-Capillary: 158 mg/dL — ABNORMAL HIGH (ref 70–99)

## 2021-04-15 LAB — COMPREHENSIVE METABOLIC PANEL
ALT: 43 U/L (ref 0–44)
AST: 55 U/L — ABNORMAL HIGH (ref 15–41)
Albumin: 2.9 g/dL — ABNORMAL LOW (ref 3.5–5.0)
Alkaline Phosphatase: 47 U/L (ref 38–126)
Anion gap: 5 (ref 5–15)
BUN: 19 mg/dL (ref 8–23)
CO2: 23 mmol/L (ref 22–32)
Calcium: 8.5 mg/dL — ABNORMAL LOW (ref 8.9–10.3)
Chloride: 111 mmol/L (ref 98–111)
Creatinine, Ser: 1.34 mg/dL — ABNORMAL HIGH (ref 0.61–1.24)
GFR, Estimated: 56 mL/min — ABNORMAL LOW (ref >60)
Glucose, Bld: 118 mg/dL — ABNORMAL HIGH (ref 70–99)
Potassium: 3.4 mmol/L — ABNORMAL LOW (ref 3.5–5.1)
Sodium: 139 mmol/L (ref 135–145)
Total Bilirubin: 0.4 mg/dL (ref 0.3–1.2)
Total Protein: 6.8 g/dL (ref 6.5–8.1)

## 2021-04-15 LAB — CK: Total CK: 515 U/L — ABNORMAL HIGH (ref 49–397)

## 2021-04-15 LAB — MAGNESIUM: Magnesium: 1.7 mg/dL (ref 1.7–2.4)

## 2021-04-15 LAB — PHOSPHORUS: Phosphorus: 3 mg/dL (ref 2.5–4.6)

## 2021-04-15 LAB — INSULIN-LIKE GROWTH FACTOR: Somatomedin C: 58 ng/mL (ref 53–222)

## 2021-04-15 NOTE — NC FL2 (Signed)
[place] LEVEL OF CARE SCREENING TOOL     IDENTIFICATION  Patient Name: Gregory Nunez. Birthdate: 1946-03-06 Sex: male Admission Date (Current Location): 04/12/2021  [facility] and [place] Number:  Herbalist and Address:  [facility],  [place] [address], [place]      Provider Number: [id number]  Attending Physician Name and Address:  Debbe Odea, MD  Relative Name and Phone Number:       Current Level of Care: Hospital Recommended Level of Care: [place] Prior Approval Number:    Date Approved/Denied:   PASRR Number: [id number]  Discharge Plan: SNF    Current Diagnoses: Patient Active Problem List   Diagnosis Date Noted   History of alcohol abuse 04/13/2021   Diabetic hypoglycemia ([place]) 04/12/2021   Rhabdomyolysis 04/12/2021   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/12/2021   Weakness of both legs    Adenomatous duodenal polyp    AVM (arteriovenous malformation) of duodenum, acquired with hemorrhage    Need for hepatitis C screening test 11/10/2020   Healthcare maintenance 11/06/2020   Hypoglycemia 31/51/7616   Acute metabolic encephalopathy 07/37/1062   Acute on chronic anemia 10/22/2020   Angiodysplasia of intestine    Iron deficiency anemia 05/22/2020   Constipation 05/22/2020   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 ([place])    History of CVA (cerebrovascular accident) 04/23/2020   Hyperlipidemia associated with type 2 diabetes mellitus ([place]) 04/23/2020   CKD (chronic kidney disease) stage 3, GFR 30-59 ml/min (HCC) 04/23/2020   Tobacco use 04/23/2020   Seizure disorder ([place]) 11/08/2018   Hypertension associated with diabetes ([place]) 11/08/2018   Type 2 diabetes mellitus without complication ([place]) 69/48/5462   Symptomatic anemia 04/15/2018   Prostate cancer ([place]) 02/27/2014   Bunion 01/17/2013   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    Orientation RESPIRATION  BLADDER Height & Weight     Self, Time, Situation, Place  Normal Continent Weight: 79.2 kg Height:  5\' 11"  (180.3 cm)  BEHAVIORAL SYMPTOMS/MOOD NEUROLOGICAL BOWEL NUTRITION STATUS      Continent Diet (regular)  AMBULATORY STATUS COMMUNICATION OF NEEDS Skin   Extensive Assist Verbally Normal                       Personal Care Assistance Level of Assistance  Bathing, Feeding, Dressing Bathing Assistance: Limited assistance Feeding assistance: Limited assistance Dressing Assistance: Limited assistance     Functional Limitations Info  Sight, Hearing, Speech Sight Info: Adequate Hearing Info: Adequate Speech Info: Adequate    SPECIAL CARE FACTORS FREQUENCY  PT (By licensed PT), OT (By licensed OT)     PT Frequency: 5x weekly OT Frequency: 5 x weekly            Contractures Contractures Info: Not present    Additional Factors Info  Code Status Code Status Info: full             Current Medications (04/15/2021):  This is the current hospital active medication list Current Facility-Administered Medications  Medication Dose Route Frequency Provider Last Rate Last Admin   acetaminophen (TYLENOL) tablet 650 mg  650 mg Oral Q6H PRN Kristopher Oppenheim, DO   650 mg at 04/12/21 2232   Or   acetaminophen (TYLENOL) suppository 650 mg  650 mg Rectal Q6H PRN Kristopher Oppenheim, DO       amLODipine (NORVASC) tablet 10 mg  10 mg Oral Daily Kristopher Oppenheim, DO   10 mg  at 04/15/21 1006   heparin injection 5,000 Units  5,000 Units Subcutaneous Q8H Kristopher Oppenheim, DO   5,000 Units at 04/15/21 0536   levETIRAcetam (KEPPRA) tablet 500 mg  500 mg Oral BID Kristopher Oppenheim, DO   500 mg at 04/15/21 1006   ondansetron (ZOFRAN) tablet 4 mg  4 mg Oral Q6H PRN Kristopher Oppenheim, DO       Or   ondansetron [facility]) injection 4 mg  4 mg Intravenous Q6H PRN Kristopher Oppenheim, DO       pantoprazole (PROTONIX) EC tablet 40 mg  40 mg Oral Daily Kristopher Oppenheim, DO   40 mg at 04/15/21 1006   thiamine 500mg  in normal saline (47ml) IVPB  500 mg  Intravenous Q8H Elodia Florence., MD 100 mL/hr at 04/15/21 0536 500 mg at 04/15/21 0536   Followed by   Derrill Memo ON 04/16/2021] thiamine (B-1) 250 mg in sodium chloride 0.9 % 50 mL IVPB  250 mg Intravenous Daily Elodia Florence., MD       Followed by   Derrill Memo ON 04/21/2021] thiamine (B-1) injection 100 mg  100 mg Intravenous Daily Elodia Florence., MD         Discharge Medications: Please see discharge summary for a list of discharge medications.  Relevant Imaging Results:  Relevant Lab Results:   Additional Information SS#: [id number]  Leeroy Cha, [place]

## 2021-04-15 NOTE — Progress Notes (Signed)
[facility] Progress Note  Patient: Gregory Nunez.     KGY:[id number]  DOA: 04/12/2021      Date of Service: the patient was seen and examined on 04/15/2021  Brief hospital course: 75 yo with hx HTN, T2DM, CKD IIIa who lives alone in an apartment and presented via EMS after he was found on the floor.   On the day prior to admission he was seen by EMS for hypoglycemia and treated and thought to be well enough to not to have to be taken to the hospital.  His nephew called the the patient next morning and there was no answer. He was found down on the ground in the afternoon.  CBG in the ED was 55. He was admitted for hypoglycemia which has resolved.  Family brought in pill bottles and Glimiperide was found in one of them. He remains intermittently confused in the hospital. CT head and MRI unrevealing.     Subjective:  No complaints. He thinks it July and that he is at [facility] (at [facility] [place] [facility]). Tells the RN that he was a heavy drinker up to 2 months ago.   Assessment and Plan: * Diabetic hypoglycemia ([place]) Recurrent hypoglycemia (seen night before admission by EMS for hypoglycemia as well) A1c 4.5 - D 10 stopped on 12/26- maintaining normal sugars He's on metformin at home which is on hold- will need to dc completely this as A1c is low Denies other meds -> Dr Marcelline Deist requested family to bring in meds, glimeperide was in pill bottle that they brought in, unclear if this was being put in his pillbox (family that fills this wasn't available).  Dr Marcelline Deist discussed with niece that he will throw the med away.   Acute metabolic encephalopathy - He remains disoriened to place, time and situation - fell out of bed again this AM - Head CT on admission without acute finding - MRI brain - mild motion degraded, no acute abnormality, has advanced chronic small vessel ischemic disease  VBG without hypercarbia TSH wnl, folate wnl, b12 wnl Follow vitamin b1 level On high  dose Thiamine since 12/26  due to h/o ETOH abuse Delirium precautions    Debility - PT recommends SNF- searching for bed  History of alcohol abuse Denies recent use   Anemia due to chronic kidney disease Chronic, appears close to baseline, CKD IIIa  Rhabdomyolysis Max CK 2,706 Trending down - Cr stable     CKD (chronic kidney disease) stage 3, GFR 30-59 ml/min ([place])- (present on admission) Chronic.  Hypertension associated with diabetes ([place])- (present on admission) Stable.  Seizure disorder ([place]) Continue keppra. Avoid hypoglycemia.           Time spent in minutes: 40 DVT prophylaxis: Heparin Code Status:   Code Status: Full Code  Level of Care: Level of care: Med-Surg Disposition Plan:  Status is: Inpatient  Remains inpatient appropriate because: remains confused an don High dose Thiamine      Objective:   Vitals:   04/14/21 1323 04/14/21 1945 04/15/21 0402 04/15/21 1534  BP: (!) 152/97 (!) 154/77 (!) 165/88 (!) 161/85  Pulse:  90 98 (!) 101  Resp: 20 20 18 19   Temp: 98.8 F (37.1 C) 98 F (36.7 C) 98.5 F (36.9 C) 98.6 F (37 C)  TempSrc: Oral Oral Oral Oral  SpO2:  98% 99% 95%  Weight:   79.2 kg   Height:       Filed Weights   04/13/21 0500 04/14/21  0500 04/15/21 0402  Weight: 98.7 kg 78.9 kg 79.2 kg    Exam: General exam: Appears comfortable  HEENT: PERRLA, oral mucosa moist, no sclera icterus or thrush Respiratory system: Clear to auscultation. Respiratory effort normal. Cardiovascular system: S1 & S2 heard, regular rate and rhythm Gastrointestinal system: Abdomen soft, non-tender, nondistended. Normal bowel sounds   Central nervous system: Alert and oriented. No focal neurological deficits. Extremities: No cyanosis, clubbing or edema Skin: No rashes or ulcers Psychiatry:  Mood & affect appropriate.   Imaging and lab data Reviewed    CBC: Recent Labs  Lab 04/12/21 1700 04/13/21 0458 04/14/21 0456 04/15/21 0517  WBC 6.9  5.4 3.9* 4.8  NEUTROABS 5.4 3.3 2.3 3.1  HGB 8.7* 8.2* 7.8* 8.0*  HCT 28.3* 24.9* 23.6* 23.3*  MCV 101.1* 96.1 95.9 93.2  PLT 175 165 158 759   Basic Metabolic Panel: Recent Labs  Lab 04/12/21 1700 04/13/21 0458 04/14/21 0456 04/15/21 0517  NA 140 139 137 139  K 3.6 3.2* 3.5 3.4*  CL 111 111 111 111  CO2 21* 22 21* 23  GLUCOSE 133* 83 253* 118*  BUN 30* 23 22 19   CREATININE 1.55* 1.59* 1.65* 1.34*  CALCIUM 8.5* 8.1* 8.0* 8.5*  MG  --  2.0 1.8 1.7  PHOS  --   --  3.2 3.0   GFR: Estimated Creatinine Clearance: 52.3 mL/min (A) (by C-G formula based on SCr of 1.34 mg/dL (H)).  Time spent: 35 minutes.   Author: Debbe Odea  04/15/2021 3:51 PM

## 2021-04-15 NOTE — Assessment & Plan Note (Signed)
-   PT recommends SNF- searching for bed

## 2021-04-15 NOTE — TOC Initial Note (Addendum)
Transition of Care (TOC) - Initial/Assessment Note    Patient Details  Name: Gregory Nunez. MRN: [id number] Date of Birth: October 26, 1945  Transition of Care [facility]) CM/SW Contact:    Leeroy Cha, RN Phone Number: 04/15/2021, 11:45 AM  Clinical Narrative:                 Process for snf placement started.  Fl2 sent out via the hub.  Auth started with [employer] Chart information uploaded to [facility] for review Expected Discharge Plan: Skilled Nursing Facility Barriers to Discharge: Continued Medical Work up   Patient Goals and CMS Choice        Expected Discharge Plan and Services Expected Discharge Plan: [place]   Discharge Planning Services: CM Consult   Living arrangements for the past 2 months: Apartment                                      Prior Living Arrangements/Services Living arrangements for the past 2 months: Apartment Lives with:: Spouse Patient language and need for interpreter reviewed:: No              Criminal Activity/Legal Involvement Pertinent to Current Situation/Hospitalization: No - Comment as needed  Activities of Daily Living Home Assistive Devices/Equipment: CBG Meter ADL Screening (condition at time of admission) Patient's cognitive ability adequate to safely complete daily activities?: Yes Is the patient deaf or have difficulty hearing?: No Does the patient have difficulty seeing, even when wearing glasses/contacts?: No Does the patient have difficulty concentrating, remembering, or making decisions?: Yes Patient able to express need for assistance with ADLs?: Yes Does the patient have difficulty dressing or bathing?: No Independently performs ADLs?: Yes (appropriate for developmental age) Does the patient have difficulty walking or climbing stairs?: No Weakness of Legs: None Weakness of Arms/Hands: None  Permission Sought/Granted                  Emotional Assessment Appearance::  Appears stated age Attitude/Demeanor/Rapport: Engaged Affect (typically observed): Calm Orientation: : Oriented to Place, Oriented to Self, Oriented to  Time, Oriented to Situation Alcohol / Substance Use: Not Applicable    Admission diagnosis:  Diabetic hypoglycemia ([place]) [E11.649] Hypoglycemia [E16.2] Patient Active Problem List   Diagnosis Date Noted   History of alcohol abuse 04/13/2021   Diabetic hypoglycemia ([place]) 04/12/2021   Rhabdomyolysis 04/12/2021   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/12/2021   Weakness of both legs    Adenomatous duodenal polyp    AVM (arteriovenous malformation) of duodenum, acquired with hemorrhage    Need for hepatitis C screening test 11/10/2020   Healthcare maintenance 11/06/2020   Hypoglycemia 88/28/0034   Acute metabolic encephalopathy 91/79/1505   Acute on chronic anemia 10/22/2020   Angiodysplasia of intestine    Iron deficiency anemia 05/22/2020   Constipation 05/22/2020   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 ([place])    History of CVA (cerebrovascular accident) 04/23/2020   Hyperlipidemia associated with type 2 diabetes mellitus ([place]) 04/23/2020   CKD (chronic kidney disease) stage 3, GFR 30-59 ml/min ([place]) 04/23/2020   Tobacco use 04/23/2020   Seizure disorder ([place]) 11/08/2018   Hypertension associated with diabetes ([place]) 11/08/2018   Type 2 diabetes mellitus without complication ([place]) 69/79/4801   Symptomatic anemia 04/15/2018   Prostate cancer ([place]) 02/27/2014   Bunion 01/17/2013   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

## 2021-04-16 LAB — GLUCOSE, CAPILLARY
Glucose-Capillary: 121 mg/dL — ABNORMAL HIGH (ref 70–99)
Glucose-Capillary: 92 mg/dL (ref 70–99)
Glucose-Capillary: 167 mg/dL — ABNORMAL HIGH (ref 70–99)

## 2021-04-16 LAB — RESP PANEL BY RT-PCR (FLU A&B, COVID) ARPGX2
Influenza A by PCR: NEGATIVE
Influenza B by PCR: NEGATIVE
SARS Coronavirus 2 by RT PCR: NEGATIVE

## 2021-04-16 MED ORDER — POLYETHYLENE GLYCOL 3350 17 G PO PACK
17.0000 g | PACK | Freq: Every day | ORAL | Status: DC | PRN
  Administered 2021-04-16: 09:00:00 17 g via ORAL
  Filled 2021-04-16: qty 1

## 2021-04-16 MED ORDER — POLYETHYLENE GLYCOL 3350 17 G PO PACK
17.0000 g | PACK | Freq: Every day | ORAL | 0 refills | Status: DC | PRN
Start: 2021-04-16 — End: 2021-06-08

## 2021-04-16 NOTE — Discharge Summary (Addendum)
Physician Discharge Summary  Gregory Nunez Gregory Nunez. GYF:[id number] DOB: 1945-07-03 DOA: 04/12/2021  PCP: Farrel Gordon, DO  Admit date: 04/12/2021 Discharge date: 04/16/2021  Admitted From: home alone  Disposition:  home with neice  Recommendations for Outpatient Follow-up:  Please f/u on B1 level and sulfonylurea screen Outpatient cognitive testing for dementia if he remains confused    Discharge Condition:  stable   CODE STATUS:  Full code   Diet recommendation:  heart healthy Consultations: none  Procedures/Studies: none   Discharge Diagnoses:  Principal Problem:   Diabetic hypoglycemia ([place]) Active Problems:   Acute metabolic encephalopathy   Seizure disorder ([place])   Hypertension associated with diabetes ([place])   CKD (chronic kidney disease) stage 3, GFR 30-59 ml/min (H[facility])   Rhabdomyolysis   Anemia due to chronic kidney disease   History of alcohol abuse   Debility     Brief Summary: 75 yo with hx HTN, T2DM, CKD IIIa who lives alone in an apartment and presented via EMS after he was found on the floor.   On the day prior to admission he was seen by EMS for hypoglycemia and treated and thought to be well enough to not to have to be taken to the hospital.  His nephew called the the patient next morning and there was no answer. He was found down on the ground in the afternoon.  CBG in the ED was 55. CBG of 30 documented in the ED.   He was admitted for hypoglycemia which has resolved.  Family brought in pill bottles and Glimiperide was found in one of them. He remains intermittently confused in the hospital. CT head and MRI unrevealing.   Hospital Course:  Diabetic hypoglycemia ([place]) Recurrent hypoglycemia (seen night before admission by EMS for hypoglycemia as well) A1c 4.5 - D 10 stopped on 12/26- maintaining normal sugars He's on metformin at home which is on hold- will need to dc completely this as A1c is low Denies other meds -> Dr Marcelline Deist requested family to  bring in meds, glimeperide was in pill bottle that they brought in, unclear if this was being put in his pillbox (family that fills this wasn't available).  Dr Marcelline Deist discussed with niece that he will throw the med away.  -  sulfonylurea screen was sent on 12/ 26 and is still pending   Acute metabolic encephalopathy - He remains a little disoriented to time (still thinks it July) but otherwise seems quite oriented - Head CT on admission without acute finding - MRI brain - mild motion degraded, no acute abnormality, has advanced chronic small vessel ischemic disease  VBG without hypercarbia TSH wnl, folate wnl, b12 wnl Follow vitamin b1 level which was drawn on 12/26 and has not yet resulted On high dose Thiamine (500 mg) since 12/26  due to h/o ETOH abuse     Debility - PT recommends SNF-  Family has declined to send him to SNF and will take him home today.   History of alcohol abuse Denies recent use     Anemia due to chronic kidney disease Chronic, appears close to baseline, CKD IIIa - he is prescribed Ferrous Sulfate which I have resumed   Rhabdomyolysis Max CK 2,706 Trending down- has been on IVF - Cr stable       CKD (chronic kidney disease) stage 3a, GFR 30-59 ml/min ([place])- (present on admission) Chronic.   Hypertension associated with diabetes ([place])- (present on admission) Cont Amlodipine   Seizure disorder ([place]) Continue keppra. Avoid  hypoglycemia.            Discharge Exam: Vitals:   04/15/21 2018 04/16/21 0528  BP: (!) 160/87 (!) 165/86  Pulse: 98 90  Resp: 18 18  Temp: 99 F (37.2 C) 98.7 F (37.1 C)  SpO2: 99% 99%   Vitals:   04/15/21 0402 04/15/21 1534 04/15/21 2018 04/16/21 0528  BP: (!) 165/88 (!) 161/85 (!) 160/87 (!) 165/86  Pulse: 98 (!) 101 98 90  Resp: 18 19 18 18   Temp: 98.5 F (36.9 C) 98.6 F (37 C) 99 F (37.2 C) 98.7 F (37.1 C)  TempSrc: Oral Oral  Oral  SpO2: 99% 95% 99% 99%  Weight: 79.2 kg     Height:         General: Pt is alert, awake, not in acute distress Cardiovascular: RRR, S1/S2 +, no rubs, no gallops Respiratory: CTA bilaterally, no wheezing, no rhonchi Abdominal: Soft, NT, ND, bowel sounds + Extremities: no edema, no cyanosis   Discharge Instructions  Discharge Instructions     Diet - low sodium heart healthy   Complete by: As directed    Increase activity slowly   Complete by: As directed       Allergies as of 04/16/2021       Reactions   Penicillins Anxiety, Other (See Comments)   DID THE REACTION INVOLVE: Swelling of the face/tongue/throat, SOB, or low BP? No Sudden or severe rash/hives, skin peeling, or the inside of the mouth or nose? No Did it require medical treatment? No When did it last happen?      many years ago If all above answers are NO, may proceed with cephalosporin use.        Medication List     STOP taking these medications    iron polysaccharides 150 MG capsule Commonly known as: Nu-Iron   metFORMIN 500 MG tablet Commonly known as: GLUCOPHAGE       TAKE these medications    amLODipine 10 MG tablet Commonly known as: NORVASC TAKE 1 TABLET BY MOUTH EVERY DAY   aspirin 81 MG EC tablet Take 1 tablet (81 mg total) by mouth daily. Swallow whole.   docusate sodium 250 MG capsule Commonly known as: COLACE Take 1 capsule (250 mg total) by mouth daily.   ferrous sulfate 325 (65 FE) MG tablet Take 325 mg by mouth daily with breakfast.   levETIRAcetam 500 MG tablet Commonly known as: Keppra Take 1 tablet (500 mg total) by mouth 2 (two) times daily.   losartan 100 MG tablet Commonly known as: COZAAR Take 100 mg by mouth daily.   pantoprazole 40 MG tablet Commonly known as: PROTONIX Take 1 tablet (40 mg total) by mouth daily.   polyethylene glycol 17 g packet Commonly known as: MIRALAX / GLYCOLAX Take 17 g by mouth daily as needed for moderate constipation.        Allergies  Allergen Reactions   Penicillins Anxiety  and Other (See Comments)    DID THE REACTION INVOLVE: Swelling of the face/tongue/throat, SOB, or low BP? No Sudden or severe rash/hives, skin peeling, or the inside of the mouth or nose? No Did it require medical treatment? No When did it last happen?      many years ago If all above answers are NO, may proceed with cephalosporin use.         CT HEAD WO CONTRAST (5MM)  Result Date: 04/12/2021 CLINICAL DATA:  [place] with trauma to the head and neck. EXAM: CT  HEAD WITHOUT CONTRAST TECHNIQUE: Contiguous axial images were obtained from the base of the skull through the vertex without intravenous contrast. COMPARISON:  10/22/2020 FINDINGS: Brain: Old small vessel ischemic changes affect the pons. No focal cerebellar finding. Cerebral hemispheres show chronic small-vessel ischemic changes of the white matter and old infarction in the left basal ganglia and radiating white matter tracts. No acute infarction, mass lesion, hemorrhage, hydrocephalus or extra-axial collection. Vascular: There is atherosclerotic calcification of the major vessels at the base of the brain. Skull: No skull fracture. Sinuses/Orbits: Clear/normal Other: None IMPRESSION: No acute or traumatic CT finding. Chronic small-vessel ischemic changes of the brainstem and cerebral hemispheric white matter. 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 Electronically Signed   By: Nelson Chimes M.D.   On: 04/12/2021 17:11   CT Cervical Spine Wo Contrast  Result Date: 04/12/2021 CLINICAL DATA:  Ataxia.  Trauma to the head and neck. EXAM: CT CERVICAL SPINE WITHOUT CONTRAST TECHNIQUE: Multidetector CT imaging of the cervical spine was performed without intravenous contrast. Multiplanar CT image reconstructions were also generated. COMPARISON:  Radiography 11/11/2015 FINDINGS: Alignment: Mild curvature convex to the right. Straightening of the normal cervical lordosis. No traumatic malalignment. Skull base and  vertebrae: No evidence of regional fracture. Prominent anterior osteophytes in the cervical region which can in certain patients be associated with dysphagia. Soft tissues and spinal canal: Negative Disc levels: The foramen magnum is widely patent. There is ordinary mild osteoarthritis of the C1-2 articulation but no encroachment upon the neural structures. C2-3: Bilateral uncovertebral osteophytes. Foraminal narrowing left worse than right. C3-4: Endplate osteophytes and chronic disc protrusion. Uncovertebral hypertrophy. Moderate canal and foraminal stenosis. C4-5: Bilateral uncovertebral prominence with bilateral foraminal narrowing. C5-6: Mild bulging of the disc.  No significant stenosis. C6-7: Mild bulging of the disc.  No significant stenosis. C7-T1: Mild bulging of the disc.  No significant stenosis. Upper chest: Negative Other: None IMPRESSION: No acute or traumatic finding. Chronic degenerative changes throughout the neck as outlined above. Stenosis of the canal and foramina at C3-4 that could be significant. Foraminal stenosis bilateral at C4-5 and left worse than right at C2-3. Electronically Signed   By: Nelson Chimes M.D.   On: 04/12/2021 17:14   MR BRAIN WO CONTRAST  Result Date: 04/13/2021 CLINICAL DATA:  Provided history: Delirium. Additional history provided: Hypoglycemia, delayed speech, confusion. EXAM: MRI HEAD WITHOUT CONTRAST TECHNIQUE: Multiplanar, multiecho pulse sequences of the brain and surrounding structures were obtained without intravenous contrast. COMPARISON:  Head CT 04/12/2021. FINDINGS: Brain: Mild intermittent motion degradation. Mild generalized cerebral and cerebellar atrophy. Redemonstrated chronic small-vessel infarcts within the bilateral cerebral hemispheric white matter and left basal ganglia/posterior limb of left internal capsule. A chronic lacunar infarct within the high left parietal lobe white matter is new as compared to the prior MRI of 10/07/2020. Associated  wallerian degeneration within the left cerebral peduncle. Background advanced patchy and ill-defined hypoattenuation within the cerebral white matter, nonspecific but compatible chronic small vessel ischemic disease. Redemonstrated chronic microhemorrhages within the left frontal and parietal lobes. Redemonstrated chronic lacunar infarcts within the bilateral pons with background pontine chronic small vessel ischemic changes. There is no acute infarct. No evidence of an intracranial mass. No extra-axial fluid collection. No midline shift. Vascular: Maintained flow voids within the proximal large arterial vessels. Skull and upper cervical spine: No focal suspicious marrow lesion. Incompletely assessed cervical spondylosis. Sinuses/Orbits: Visualized orbits show no acute finding. Bilateral lens replacements. Mild mucosal thickening within the bilateral  ethmoid sinuses. IMPRESSION: Mildly motion degraded exam. No evidence of acute intracranial abnormality. Parenchymal atrophy, advanced chronic small vessel ischemic disease and multiple chronic small-vessel infarcts, as outlined. A chronic small-vessel infarct within the high left parietal lobe white matter is new from the brain MRI of 10/07/2020. These findings are otherwise unchanged from the prior MRI. Mild paranasal sinus disease, as described. Electronically Signed   By: Kellie Simmering D.O.   On: 04/13/2021 21:24   DG Pelvis Portable  Result Date: 04/12/2021 CLINICAL DATA:  Hyperglycemia, fall EXAM: PORTABLE PELVIS 1-2 VIEWS COMPARISON:  Portable exam 1608 hours without priors for comparison FINDINGS: Mild osseous demineralization. Hip and SI joint spaces preserved. No acute fracture, dislocation, or bone destruction. IMPRESSION: No acute osseous abnormalities. Electronically Signed   By: Lavonia Dana M.D.   On: 04/12/2021 16:21   DG Chest Port 1 View  Result Date: 04/12/2021 CLINICAL DATA:  Fall, hypoglycemia EXAM: PORTABLE CHEST 1 VIEW COMPARISON:   Portable exam 1607 hours compared to 10/05/2020 FINDINGS: Normal heart size, mediastinal contours, and pulmonary vascularity. Minimal subsegmental atelectasis mid to lower RIGHT lung. Lungs otherwise clear. No acute infiltrate, pleural effusion, or pneumothorax. Osseous structures unremarkable. IMPRESSION: Minimal RIGHT basilar atelectasis. Electronically Signed   By: Lavonia Dana M.D.   On: 04/12/2021 16:20     The results of significant diagnostics from this hospitalization (including imaging, microbiology, ancillary and laboratory) are listed below for reference.     Microbiology: Recent Results (from the past 240 hour(s))  Resp Panel by RT-PCR (Flu A&B, Covid) Nasopharyngeal Swab     Status: None   Collection Time: 04/12/21  3:57 PM   Specimen: Nasopharyngeal Swab; Nasopharyngeal(NP) swabs in vial transport medium  Result Value Ref Range Status   SARS Coronavirus 2 by RT PCR NEGATIVE NEGATIVE Final    Comment: (NOTE) SARS-CoV-2 target nucleic acids are NOT DETECTED.  The SARS-CoV-2 RNA is generally detectable in upper respiratory specimens during the acute phase of infection. The lowest concentration of SARS-CoV-2 viral copies this assay can detect is 138 copies/mL. A negative result does not preclude SARS-Cov-2 infection and should not be used as the sole basis for treatment or other patient management decisions. A negative result may occur with  improper specimen collection/handling, submission of specimen other than nasopharyngeal swab, presence of viral mutation(s) within the areas targeted by this assay, and inadequate number of viral copies(<138 copies/mL). A negative result must be combined with clinical observations, patient history, and epidemiological information. The expected result is Negative.  Fact Sheet for Patients:  EntrepreneurPulse.com.au  Fact Sheet for Healthcare Providers:  IncredibleEmployment.be  This test is no t yet  approved or cleared by the [place] FDA and  has been authorized for detection and/or diagnosis of SARS-CoV-2 by FDA under an Emergency Use Authorization (EUA). This EUA will remain  in effect (meaning this test can be used) for the duration of the COVID-19 declaration under Section 564(b)(1) of the Act, 21 U.S.C.section 360bbb-3(b)(1), unless the authorization is terminated  or revoked sooner.       Influenza A by PCR NEGATIVE NEGATIVE Final   Influenza B by PCR NEGATIVE NEGATIVE Final    Comment: (NOTE) The Xpert Xpress SARS-CoV-2/FLU/RSV plus assay is intended as an aid in the diagnosis of influenza from Nasopharyngeal swab specimens and should not be used as a sole basis for treatment. Nasal washings and aspirates are unacceptable for Xpert Xpress SARS-CoV-2/FLU/RSV testing.  Fact Sheet for Patients: EntrepreneurPulse.com.au  Fact Sheet for Healthcare Providers: IncredibleEmployment.be  This test is not yet approved or cleared by the [place] and has been authorized for detection and/or diagnosis of SARS-CoV-2 by FDA under an Emergency Use Authorization (EUA). This EUA will remain in effect (meaning this test can be used) for the duration of the COVID-19 declaration under Section 564(b)(1) of the Act, 21 U.S.C. section 360bbb-3(b)(1), unless the authorization is terminated or revoked.  Performed at [facility], [place] [address], [place], [place] [address]      Labs: BNP (last 3 results) No results for input(s): BNP in the last 8760 hours. Basic Metabolic Panel: Recent Labs  Lab 04/12/21 1700 04/13/21 0458 04/14/21 0456 04/15/21 0517  NA 140 139 137 139  K 3.6 3.2* 3.5 3.4*  CL 111 111 111 111  CO2 21* 22 21* 23  GLUCOSE 133* 83 253* 118*  BUN 30* 23 22 19   CREATININE 1.55* 1.59* 1.65* 1.34*  CALCIUM 8.5* 8.1* 8.0* 8.5*  MG  --  2.0 1.8 1.7  PHOS  --   --  3.2 3.0   Liver Function  Tests: Recent Labs  Lab 04/12/21 1700 04/13/21 0458 04/14/21 0456 04/15/21 0517  AST 101* 95* 70* 55*  ALT 44 42 38 43  ALKPHOS 46 44 43 47  BILITOT 0.4 0.5 0.4 0.4  PROT 7.4 6.7 6.4* 6.8  ALBUMIN 3.2* 2.8* 2.7* 2.9*   No results for input(s): LIPASE, AMYLASE in the last 168 hours. No results for input(s): AMMONIA in the last 168 hours. CBC: Recent Labs  Lab 04/12/21 1700 04/13/21 0458 04/14/21 0456 04/15/21 0517  WBC 6.9 5.4 3.9* 4.8  NEUTROABS 5.4 3.3 2.3 3.1  HGB 8.7* 8.2* 7.8* 8.0*  HCT 28.3* 24.9* 23.6* 23.3*  MCV 101.1* 96.1 95.9 93.2  PLT 175 165 158 171   Cardiac Enzymes: Recent Labs  Lab 04/12/21 1700 04/13/21 0458 04/13/21 1304 04/14/21 0808 04/15/21 0517  CKTOTAL 2,927* 2,706* 1,994* 959* 515*   BNP: Invalid input(s): POCBNP CBG: Recent Labs  Lab 04/15/21 2020 04/15/21 2340 04/16/21 0332 04/16/21 0751 04/16/21 1146  GLUCAP 158* 134* 121* 92 167*   D-Dimer No results for input(s): DDIMER in the last 72 hours. Hgb A1c No results for input(s): HGBA1C in the last 72 hours. Lipid Profile No results for input(s): CHOL, HDL, LDLCALC, TRIG, CHOLHDL, LDLDIRECT in the last 72 hours. Thyroid function studies Recent Labs    04/13/21 1304  TSH 3.758   Anemia work up Recent Labs    04/13/21 1304  VITAMINB12 546  FOLATE 15.7   Urinalysis    Component Value Date/Time   COLORURINE YELLOW 10/22/2020 2034   APPEARANCEUR CLEAR 10/22/2020 2034   LABSPEC 1.013 10/22/2020 2034   PHURINE 5.0 10/22/2020 2034   GLUCOSEU 50 (A) 10/22/2020 2034   HGBUR NEGATIVE 10/22/2020 2034   [place] NEGATIVE 10/22/2020 2034   KETONESUR NEGATIVE 10/22/2020 2034   PROTEINUR >=300 (A) 10/22/2020 2034   UROBILINOGEN 1.0 01/10/2019 1132   NITRITE NEGATIVE 10/22/2020 2034   LEUKOCYTESUR NEGATIVE 10/22/2020 2034   Sepsis Labs Invalid input(s): PROCALCITONIN,  WBC,  LACTICIDVEN Microbiology Recent Results (from the past 240 hour(s))  Resp Panel by RT-PCR (Flu  A&B, Covid) Nasopharyngeal Swab     Status: None   Collection Time: 04/12/21  3:57 PM   Specimen: Nasopharyngeal Swab; Nasopharyngeal(NP) swabs in vial transport medium  Result Value Ref Range Status   SARS Coronavirus 2 by RT PCR NEGATIVE NEGATIVE Final    Comment: (NOTE) SARS-CoV-2 target nucleic acids are NOT  DETECTED.  The SARS-CoV-2 RNA is generally detectable in upper respiratory specimens during the acute phase of infection. The lowest concentration of SARS-CoV-2 viral copies this assay can detect is 138 copies/mL. A negative result does not preclude SARS-Cov-2 infection and should not be used as the sole basis for treatment or other patient management decisions. A negative result may occur with  improper specimen collection/handling, submission of specimen other than nasopharyngeal swab, presence of viral mutation(s) within the areas targeted by this assay, and inadequate number of viral copies(<138 copies/mL). A negative result must be combined with clinical observations, patient history, and epidemiological information. The expected result is Negative.  Fact Sheet for Patients:  EntrepreneurPulse.com.au  Fact Sheet for Healthcare Providers:  IncredibleEmployment.be  This test is no t yet approved or cleared by the [place] FDA and  has been authorized for detection and/or diagnosis of SARS-CoV-2 by FDA under an Emergency Use Authorization (EUA). This EUA will remain  in effect (meaning this test can be used) for the duration of the COVID-19 declaration under Section 564(b)(1) of the Act, 21 U.S.C.section 360bbb-3(b)(1), unless the authorization is terminated  or revoked sooner.       Influenza A by PCR NEGATIVE NEGATIVE Final   Influenza B by PCR NEGATIVE NEGATIVE Final    Comment: (NOTE) The Xpert Xpress SARS-CoV-2/FLU/RSV plus assay is intended as an aid in the diagnosis of influenza from Nasopharyngeal swab specimens  and should not be used as a sole basis for treatment. Nasal washings and aspirates are unacceptable for Xpert Xpress SARS-CoV-2/FLU/RSV testing.  Fact Sheet for Patients: EntrepreneurPulse.com.au  Fact Sheet for Healthcare Providers: IncredibleEmployment.be  This test is not yet approved or cleared by the [place] FDA and has been authorized for detection and/or diagnosis of SARS-CoV-2 by FDA under an Emergency Use Authorization (EUA). This EUA will remain in effect (meaning this test can be used) for the duration of the COVID-19 declaration under Section 564(b)(1) of the Act, 21 U.S.C. section 360bbb-3(b)(1), unless the authorization is terminated or revoked.  Performed at [facility], [place] [address] [place], [place] [address]      Time coordinating discharge in minutes: 65  SIGNED:   Debbe Odea, MD  [facility] 04/16/2021, 12:16 PM

## 2021-04-16 NOTE — Plan of Care (Signed)
Patients nephew Barbaraann Rondo came to pick patient up, went over discharge instructions with Barbaraann Rondo and patients niece and stressed to them that patient is not to be on Metformin anymore.  Metformin bottles removed from regular medications, placed in a plastic bag with a note stating do not take.  Family members verbalized understanding.  Patient transported to main entrance via wheelchair to be taken home by family.

## 2021-04-16 NOTE — TOC Progression Note (Addendum)
Transition of Care [facility]) - Progression Note    Patient Details  Name: Gregory Nunez. MRN: [id number] Date of Birth: Sep 13, 1945  Transition of Care [facility]) CM/SW Contact  Leeroy Cha, RN Phone Number: 04/16/2021, 9:54 AM  Clinical Narrative:    Patient wants t9o go [place] started on afternoon of 122822 Blaine Hamper is [id number] Insurance Josem Kaufmann is  [id number]  dated 122922 through [id number] Tct-Janie -checking to see she can take today Pt has bed need family member to come and sign him in. Tct-Pamela Sloan she will go and sign patient in at [facility]. Tct-janie-can come to room 307. Ptar called at 1510 will pickup between 5-6pm. Transport packet to unit clerk.  Expected Discharge Plan: [place] Barriers to Discharge: Continued Medical Work up  Expected Discharge Plan and Services Expected Discharge Plan: [place]   Discharge Planning Services: CM Consult   Living arrangements for the past 2 months: Apartment                                       Social Determinants of Health (SDOH) Interventions    Readmission Risk Interventions No flowsheet data found.

## 2021-04-17 NOTE — TOC Transition Note (Signed)
Transition of Care [facility]) - CM/SW Discharge Note   Patient Details  Name: Gregory Nunez. MRN: [id number] Date of Birth: [birth year]  Transition of Care [facility]) CM/SW Contact:  Leeroy Cha, RN Phone Number: 04/17/2021, 7:16 AM   Clinical Narrative:    123022/text message sent to [facility] for hhc, pt ot and aide.   Final next level of care: Home w Home Health Services Barriers to Discharge: Barriers Resolved   Patient Goals and CMS Choice        Discharge Placement                       Discharge Plan and Services   Discharge Planning Services: CM Consult                      HH Arranged: PT, OT, Nurse's Aide HH Agency: [place] Date [place]: 04/17/21 Time [place]: [phone] Representative spoke with at [place]: stacie  Social Determinants of Health ([place]) Interventions     Readmission Risk Interventions No flowsheet data found.

## 2021-04-21 LAB — VITAMIN B1: Vitamin B1 (Thiamine): 70 nmol/L (ref 66.5–200.0)

## 2021-04-24 LAB — SULFONYLUREA HYPOGLYCEMICS PANEL, SERUM
Acetohexamide: NEGATIVE ug/mL (ref 20–60)
Chlorpropamide: NEGATIVE ug/mL (ref 75–250)
Glimepiride: NEGATIVE ng/mL (ref 80–250)
Glipizide: NEGATIVE ng/mL (ref 200–1000)
Glyburide: NEGATIVE ng/mL
Nateglinide: NEGATIVE ng/mL
Repaglinide: NEGATIVE ng/mL
Tolazamide: NEGATIVE ug/mL
Tolbutamide: NEGATIVE ug/mL (ref 40–100)

## 2021-04-28 NOTE — Telephone Encounter (Signed)
Spoke with niece regarding PCS: assessment  was done April 27, 2021.

## 2021-05-07 ENCOUNTER — Telehealth: Payer: Self-pay | Admitting: *Deleted

## 2021-05-07 NOTE — Telephone Encounter (Signed)
SPOKE WITH PAMELA REGARDING [facility] PCS REQUEST. ASSESSMENT DONE 04-27-2021. PATIENT HAS SERVICE BY [facility].

## 2021-06-02 ENCOUNTER — Telehealth: Payer: Self-pay

## 2021-06-02 NOTE — Telephone Encounter (Signed)
Pt's niece requesting to speak with a nurse about bp medication.

## 2021-06-02 NOTE — Telephone Encounter (Signed)
Return call to [facility] OT with [facility] requesting verbal orders to "Extend HHOT to once a week x 3 weeks and Speech Therapy eval for aphasia". She stated pt had been doing well until today he had Bp issues; Bp is up with activity and does not think he's consistent with his meds. I told her I had talked to pt's niece(see separate encounter) about BP meds and she told me she will give pt his BP meds to him after talking to me. VO given - if not appropriate, let me know.

## 2021-06-02 NOTE — Telephone Encounter (Signed)
Gregory Nunez with [facility] hh requesting verbal order for OT. Please call pt back.

## 2021-06-02 NOTE — Telephone Encounter (Signed)
Return call to pt's niece, [name]. She wanted to be pt is taking BP meds. She named Losartan potassium and Amlodipine; I told her this correct. She asked about Levetiracetam - informed her this is for seizures. She stated when he was in the hospital, he was taken off diabetes medication and she will discuss this with the doctor at his appt on Monday.

## 2021-06-08 ENCOUNTER — Encounter: Payer: Self-pay | Admitting: Student

## 2021-06-08 ENCOUNTER — Ambulatory Visit (INDEPENDENT_AMBULATORY_CARE_PROVIDER_SITE_OTHER): Payer: Medicare Other | Admitting: Student

## 2021-06-08 VITALS — BP 138/70 | HR 97 | Temp 98.6°F | Wt 171.8 lb

## 2021-06-08 DIAGNOSIS — E1159 Type 2 diabetes mellitus with other circulatory complications: Secondary | ICD-10-CM | POA: Diagnosis not present

## 2021-06-08 DIAGNOSIS — Z1211 Encounter for screening for malignant neoplasm of colon: Secondary | ICD-10-CM

## 2021-06-08 DIAGNOSIS — Z23 Encounter for immunization: Secondary | ICD-10-CM

## 2021-06-08 DIAGNOSIS — I152 Hypertension secondary to endocrine disorders: Secondary | ICD-10-CM | POA: Diagnosis not present

## 2021-06-08 DIAGNOSIS — E1169 Type 2 diabetes mellitus with other specified complication: Secondary | ICD-10-CM | POA: Diagnosis not present

## 2021-06-08 DIAGNOSIS — E785 Hyperlipidemia, unspecified: Secondary | ICD-10-CM

## 2021-06-08 DIAGNOSIS — Z0000 Encounter for general adult medical examination without abnormal findings: Secondary | ICD-10-CM

## 2021-06-08 DIAGNOSIS — E119 Type 2 diabetes mellitus without complications: Secondary | ICD-10-CM

## 2021-06-08 MED ORDER — FERROUS SULFATE 325 (65 FE) MG PO TABS: 325.0000 mg | ORAL_TABLET | ORAL | 1 refills | Status: DC

## 2021-06-08 MED ORDER — POLYETHYLENE GLYCOL 3350 17 G PO PACK: 17.0000 g | PACK | Freq: Every day | ORAL | 0 refills | Status: DC | PRN

## 2021-06-08 MED ORDER — ASPIRIN 81 MG PO TBEC: 81.0000 mg | DELAYED_RELEASE_TABLET | Freq: Every day | ORAL | Status: DC

## 2021-06-08 MED ORDER — AMLODIPINE BESYLATE 10 MG PO TABS: 10.0000 mg | ORAL_TABLET | Freq: Every day | ORAL | 3 refills | Status: DC

## 2021-06-08 MED ORDER — ATORVASTATIN CALCIUM 40 MG PO TABS: 40.0000 mg | ORAL_TABLET | Freq: Every day | ORAL | 3 refills | Status: DC

## 2021-06-08 MED ORDER — DOCUSATE SODIUM 250 MG PO CAPS: 250.0000 mg | ORAL_CAPSULE | Freq: Every day | ORAL | 0 refills | Status: DC

## 2021-06-08 MED ORDER — LOSARTAN POTASSIUM 100 MG PO TABS: 100.0000 mg | ORAL_TABLET | Freq: Every day | ORAL | 3 refills | Status: DC

## 2021-06-08 MED ORDER — LEVETIRACETAM 500 MG PO TABS: 500.0000 mg | ORAL_TABLET | Freq: Two times a day (BID) | ORAL | 4 refills | Status: DC

## 2021-06-08 NOTE — Progress Notes (Signed)
° °  CC: Hospital follow-up for hypoglycemia  HPI:  [name] is a 76 y.o. male with history listed below presenting to the [facility] for hospital follow-up for hypoglycemia. Please see individualized problem based charting for full HPI.  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  Physical Exam:  Vitals:   06/08/21 1459  BP: 138/70  Pulse: 97  Temp: 98.6 F (37 C)  TempSrc: Oral  SpO2: 100%  Weight: 171 lb 12.8 oz (77.9 kg)   Physical Exam Constitutional:      Appearance: Normal appearance.  HENT:     Mouth/Throat:     Mouth: Mucous membranes are moist.     Pharynx: Oropharynx is clear. No oropharyngeal exudate.  Eyes:     Extraocular Movements: Extraocular movements intact.     Conjunctiva/sclera: Conjunctivae normal.     Pupils: Pupils are equal, round, and reactive to light.  Cardiovascular:     Rate and Rhythm: Normal rate and regular rhythm.     Pulses: Normal  pulses.     Heart sounds: Normal heart sounds. No murmur heard.   No gallop.  Pulmonary:     Effort: Pulmonary effort is normal.     Breath sounds: Normal breath sounds. No wheezing, rhonchi or rales.  Abdominal:     General: Bowel sounds are normal. There is no distension.     Palpations: Abdomen is soft.     Tenderness: There is no abdominal tenderness.  Musculoskeletal:        General: Normal range of motion.  Skin:    General: Skin is warm and dry.  Neurological:     Mental Status: He is alert. Mental status is at baseline.  Psychiatric:        Mood and Affect: Mood normal.        Behavior: Behavior normal.     Assessment & Plan:   See Encounters Tab for problem based charting.  Patient discussed with Dr.  Cain Sieve

## 2021-06-08 NOTE — Patient Instructions (Signed)
[name],  It was a pleasure seeing you in the clinic today.   I have refilled all of your medications and sent them to your pharmacy. I have prescribed lipitor (atorvastatin) to help reduce the chances of another stroke happening. Please come back in 3 months for a follow up visit.  Please call our clinic at [phone] if you have any questions or concerns. The best time to call is Monday-Friday from 9am-4pm, but there is someone available 24/7 at the same number. If you need medication refills, please notify your pharmacy one week in advance and they will send [place] a request.   Thank you for letting us take part in your care. We look forward to seeing you next time!

## 2021-06-08 NOTE — Assessment & Plan Note (Signed)
Placed order for screening colonoscopy and patient received his flu shot today.

## 2021-06-08 NOTE — Assessment & Plan Note (Addendum)
Patient with history of hyperlipidemia, previously on Lipitor 80 mg daily for secondary stroke prevention.  It is unclear when he was taken off of Lipitor, but he is currently not on any statin therapy.  Discussed benefit of statin therapy along with aspirin for secondary stroke prevention.  Patient and family are agreeable to restarting Lipitor.  We will start again at 40 mg daily.  Plan: -Restart Lipitor 40 mg daily

## 2021-06-08 NOTE — Assessment & Plan Note (Signed)
Patient with history of type 2 diabetes, previously on metformin monotherapy.  He has had 2 back-to-back A1c's at or below 4.5%.  He was recently hospitalized about a month ago for 2 episodes of hypoglycemia with CBG in the 50s.  He responded to D10 infusion and his sugars normalized.  Metformin was subsequently discontinued at discharge.  Unclear as to why patient had 2 bouts of hypoglycemia given that metformin should not result in it.  There was initially a concern about patient having Amaryl pills in his pillbox, however sulfonylurea testing was negative.  He does note that since hospital discharge and stopping metformin, he has not had any further bouts of hypoglycemia and feels well currently.  Discussed that should he have another episode of hypoglycemia, further work-up would be indicated to ensure there are no other causes (insulinoma, etc.).  He agrees with plan.  At this time, will keep patient off of any antidiabetic medications.

## 2021-06-09 NOTE — Progress Notes (Signed)
Internal Medicine Clinic Attending  Case discussed with Dr. Allyson Sabal  At the time of the visit.  We reviewed the residents history and exam and pertinent patient test results.  I agree with the assessment, diagnosis, and plan of care documented in the residents note.    Since the hypoglycemia has not recurred, I think it was most likely in the setting of medicine (glimeperide), and no further workup needed right now.  Dr. Allyson Sabal noticed that patient should be on a statin after his CVA, and I agree. He did have mild rhabdo in the hospital, but this was in the setting of being found down - no evidence of statin-induced myopathy, and he said he tolerated Atorva 80 well previously. Will start with Atorva 40.

## 2021-06-12 ENCOUNTER — Telehealth: Payer: Self-pay

## 2021-06-12 NOTE — Telephone Encounter (Signed)
[facility] is calling to report Pt and family is refusing any  services from social work but will still be getting home health .. and she is going to refer him to the cap program who should extend his home health hours

## 2021-06-22 ENCOUNTER — Telehealth: Payer: Self-pay | Admitting: Internal Medicine

## 2021-06-22 NOTE — Telephone Encounter (Signed)
Rec'd call from [place] [phone] request VO ? ?Nursing 1 week 4 ?Nursing 1 every 2 week 4 ? ?RN Hazle Nordmann also states patient is out of his  ? ?levETIRAcetam (KEPPRA) 500 MG tablet ? ?[id number] [place](Ph: [phone] ?

## 2021-06-22 NOTE — Telephone Encounter (Signed)
Returned call to Hazle Nordmann, RN with [facility]. No answer. Left message on secure VM that Keppra #[id number] with 4 [facility]ills [facility] [address] [facility]. Also, requested return call for VO. ?  ? ? ?

## 2021-06-23 NOTE — Telephone Encounter (Signed)
Call from [place] , [place] with [facility].  Needs ok to do visits 1 time per week for 4 weeks for patient  to do teaching about his meds and Hypertensin. Patient is not eating foods that will help with his hypertension. Also needs verbal order for 1 time every other week for 4 weeks.  To do med and Hypertension teaching.   ?

## 2021-06-24 ENCOUNTER — Telehealth: Payer: Self-pay | Admitting: Internal Medicine

## 2021-06-24 NOTE — Telephone Encounter (Signed)
Returned call to Raynaldo Opitz, OT with [facility].  Requesting VO for HH OT 1 week 2 to work on IADLs. Verbal auth given. Will route to Yellow Team for agreement/denial. ? ?

## 2021-06-24 NOTE — Telephone Encounter (Signed)
[place] [facility] [phone] requesting VO ? ?[place] OT for Once a week for 2 weeks. ? ?

## 2021-06-30 ENCOUNTER — Telehealth: Payer: Self-pay | Admitting: *Deleted

## 2021-06-30 NOTE — Telephone Encounter (Signed)
Call from Nurse at [facility].  Patient would like to get a prescription for Nicotine Patches. ?

## 2021-07-03 NOTE — Telephone Encounter (Signed)
Called pt - his niece, [name], answered the telephone. Informed her pt's doctor would like to know how much he smokes so the doctor can order him nicotine patches as suggested by the [facility]. . She stated about 2 packs a week. Stated she's the ones who buys cigarettes for him. ?

## 2021-07-06 MED ORDER — NICOTINE 7 MG/24HR TD PT24: 7.0000 mg | MEDICATED_PATCH | TRANSDERMAL | 0 refills | Status: AC

## 2021-07-31 ENCOUNTER — Telehealth: Payer: Self-pay | Admitting: *Deleted

## 2021-07-31 NOTE — Telephone Encounter (Signed)
Dr Havery Moros- this pt is scheduled for a recall colon with you on 5/17. He has quite a complicated medical history. Could you please review and advise if he is ok for [facility] or would you prefer [facility] or an OV? ? ?Thank you, ? ?Lattie Haw PV ?

## 2021-08-03 ENCOUNTER — Other Ambulatory Visit: Payer: Self-pay | Admitting: *Deleted

## 2021-08-03 NOTE — Telephone Encounter (Signed)
Thanks.  I think it would be best to have him see me in the office first, you can cancel his colonoscopy and we can reschedule that when I see him in the office in case anything else is needed.  ?

## 2021-08-03 NOTE — Telephone Encounter (Signed)
Received call from [place] with [facility] She would also like to cancel appt on 4/21 as he is not due for f/u till 5/20 ?

## 2021-08-04 MED ORDER — FERROUS SULFATE 325 (65 FE) MG PO TABS: 325.0000 mg | ORAL_TABLET | ORAL | 1 refills | Status: DC

## 2021-08-07 ENCOUNTER — Encounter: Payer: Medicare Other | Admitting: Internal Medicine

## 2021-09-02 ENCOUNTER — Encounter: Payer: Medicare Other | Admitting: Gastroenterology

## 2021-09-16 ENCOUNTER — Ambulatory Visit: Payer: Medicare Other | Admitting: Gastroenterology

## 2021-11-22 ENCOUNTER — Ambulatory Visit (HOSPITAL_COMMUNITY)
Admission: EM | Admit: 2021-11-22 | Discharge: 2021-11-22 | Disposition: A | Discharge status: Home / Self Care | Payer: Medicare Other | Attending: Physician Assistant | Admitting: Physician Assistant

## 2021-11-22 ENCOUNTER — Other Ambulatory Visit: Payer: Self-pay

## 2021-11-22 ENCOUNTER — Encounter (HOSPITAL_COMMUNITY): Payer: Self-pay | Admitting: *Deleted

## 2021-11-22 DIAGNOSIS — I10 Essential (primary) hypertension: Secondary | ICD-10-CM | POA: Diagnosis not present

## 2021-11-22 DIAGNOSIS — L02818 Cutaneous abscess of other sites: Secondary | ICD-10-CM | POA: Diagnosis not present

## 2021-11-22 DIAGNOSIS — L0291 Cutaneous abscess, unspecified: Secondary | ICD-10-CM

## 2021-11-22 MED ORDER — SULFAMETHOXAZOLE-TRIMETHOPRIM 800-160 MG PO TABS: 1.0000 | ORAL_TABLET | Freq: Two times a day (BID) | ORAL | 0 refills | Status: DC

## 2021-11-22 MED ORDER — LOSARTAN POTASSIUM 50 MG PO TABS
50.0000 mg | ORAL_TABLET | Freq: Every day | ORAL | 2 refills | Status: DC
Start: 2021-11-22 — End: 2021-12-04

## 2021-11-22 NOTE — ED Triage Notes (Signed)
PT reports he no longer takes any of the meds listed in his chart because the Doctors took him off the meds.

## 2021-11-22 NOTE — ED Triage Notes (Signed)
Pt reports he has had the lump at base of his neck ( anterior) for years . Now lump is bigger.

## 2021-11-22 NOTE — Discharge Instructions (Addendum)
EvalI have scheduled an internal referral to surgeon to have the area waited and treated. I have also made an internal referral to schedule becoming established with a primary care physician to have your hypertension treated. Advised take the Bactrim DS 1 every 12 hours until completed to help treat the cystic formation. Advised to restart the losartan at 50 mg 1 tablet daily to treat the hypertension. Advised to follow-up with urgent care as needed.

## 2021-11-22 NOTE — ED Provider Notes (Signed)
[place]    CSN: [id number] Arrival date & time: 11/22/21  1019      History   Chief Complaint Chief Complaint  Patient presents with   lump at base of neck anterior    HPI Gregory Delpriore Conn Trombetta. is a 76 y.o. male.   76 year old male presents with area on his upper chest.  Patient indicates that he has not been followed by primary care since his last emergency room visit and hospitalization December 2022.  Patient indicates at that time he was advised to stop all of his medicines, and he has not been taking his Keppra or blood pressure medicines.  Today in the office his blood pressure is elevated 198/92.  Patient is status post stroke 9 months ago.  Patient indicates he has not have any headaches, or dizziness.  Today the patient is mainly wanting to have the nodule in his upper chest evaluated.  Has been causing him some pain and tenderness, it has become red, irritated and tender.  There has been no drainage from the area.  Patient denies fever or chills.     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   Weakness of both legs    Adenomatous duodenal polyp    AVM (arteriovenous malformation) of duodenum, acquired with hemorrhage    Need for hepatitis C screening test 11/10/2020   Healthcare maintenance 11/06/2020   Acute on chronic anemia 10/22/2020   Angiodysplasia of intestine    Iron deficiency anemia 05/22/2020   Constipation 05/22/2020   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 ([place])    History of CVA (cerebrovascular accident) 04/23/2020   Hyperlipidemia associated with type 2 diabetes mellitus ([place]) 04/23/2020   CKD (chronic kidney disease) stage 3, GFR 30-59 ml/min ([place]) 04/23/2020   Tobacco use 04/23/2020   Seizure disorder ([place]) 11/08/2018   Hypertension associated with diabetes ([place]) 11/08/2018   Type 2 diabetes mellitus without complication ([place]) 92/02/9416   Symptomatic anemia 04/15/2018   Prostate cancer ([place]) 02/27/2014   Bunion 01/17/2013   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 Quentin Ore   COLONOSCOPY N/A 05/23/2020   Procedure: COLONOSCOPY;  Surgeon: Jackquline Denmark, MD;  Location: [facility];   Service: Gastroenterology;  Laterality: N/A;   ENTEROSCOPY N/A 10/07/2020   Procedure: ENTEROSCOPY;  Surgeon: [name], MD;  Location: [facility] ENDOSCOPY;  Service: Endoscopy;  Laterality: N/A;   ESOPHAGOGASTRODUODENOSCOPY Left 04/16/2018   Procedure: ESOPHAGOGASTRODUODENOSCOPY (EGD);  Surgeon: Juanita Craver, MD;  Location: [facility];  Service: Endoscopy;  Laterality: Left;   ESOPHAGOGASTRODUODENOSCOPY (EGD) WITH PROPOFOL N/A 05/23/2020   Procedure: ESOPHAGOGASTRODUODENOSCOPY (EGD) WITH PROPOFOL;  Surgeon: Jackquline Denmark, MD;  Location: [facility];  Service: Gastroenterology;  Laterality: N/A;   ESOPHAGOGASTRODUODENOSCOPY (EGD) WITH PROPOFOL N/A 12/09/2020   Procedure: ESOPHAGOGASTRODUODENOSCOPY (EGD) WITH PROPOFOL;  Surgeon: Thornton Park, MD;  Location: [facility];  Service: Gastroenterology;  Laterality: N/A;   EYE SURGERY     FOOT SURGERY     HOT HEMOSTASIS N/A 04/16/2018   Procedure: HOT HEMOSTASIS (ARGON PLASMA COAGULATION/BICAP);  Surgeon: Juanita Craver, MD;  Location: [facility];  Service: Endoscopy;  Laterality: N/A;   HOT HEMOSTASIS N/A 05/23/2020   Procedure: HOT HEMOSTASIS (ARGON PLASMA COAGULATION/BICAP);  Surgeon: Jackquline Denmark, MD;  Location: [facility];  Service: Gastroenterology;  Laterality: N/A;   HOT HEMOSTASIS N/A 12/09/2020   Procedure: HOT HEMOSTASIS (ARGON PLASMA COAGULATION/BICAP);  Surgeon: Thornton Park, MD;  Location: [facility];  Service: Gastroenterology;  Laterality: N/A;   LYMPHADENECTOMY Bilateral 02/27/2014   Procedure: BILATERAL LYMPHADENECTOMY;  Surgeon: Alexis Frock, MD;  Location: [facility];  Service: Urology;  Laterality: Bilateral;   POLYPECTOMY  05/23/2020   Procedure: POLYPECTOMY;  Surgeon: Jackquline Denmark, MD;  Location: [facility];  Service: Gastroenterology;;   PROSTATE BIOPSY  12/2013   Gleason 4+3=7, volume 31.31 cc   ROBOT ASSISTED LAPAROSCOPIC RADICAL PROSTATECTOMY N/A 02/27/2014   Procedure: ROBOTIC ASSISTED LAPAROSCOPIC RADICAL  PROSTATECTOMY WITH INDOCYANINE GREEN DYE;  Surgeon: Alexis Frock, MD;  Location: [facility];  Service: Urology;  Laterality: N/A;       Home Medications    Prior to Admission medications   Medication Sig Start Date End Date Taking? Authorizing Provider  losartan (COZAAR) 50 MG tablet Take 1 tablet (50 mg total) by mouth daily. 11/22/21  Yes Nyoka Lint, PA-C  sulfamethoxazole-trimethoprim (BACTRIM DS) 800-160 MG tablet Take 1 tablet by mouth 2 (two) times daily for 7 days. 11/22/21 11/29/21 Yes Nyoka Lint, PA-C  amLODipine (NORVASC) 10 MG tablet Take 1 tablet (10 mg total) by mouth daily. 06/08/21   Virl Axe, MD  aspirin 81 MG EC tablet Take 1 tablet (81 mg total) by mouth daily. Swallow whole. 06/08/21 06/08/22  Virl Axe, MD  atorvastatin (LIPITOR) 40 MG tablet Take 1 tablet (40 mg total) by mouth daily. 06/08/21   Virl Axe, MD  docusate sodium (COLACE) 250 MG capsule Take 1 capsule (250 mg total) by mouth daily. 06/08/21   Virl Axe, MD  ferrous sulfate 325 (65 FE) MG tablet Take 1 tablet (325 mg total) by mouth every other day. 08/04/21   Farrel Gordon, DO  levETIRAcetam ([place])  500 MG tablet Take 1 tablet (500 mg total) by mouth 2 (two) times daily. 06/08/21   Virl Axe, MD  polyethylene glycol (MIRALAX / GLYCOLAX) 17 g packet Take 17 g by mouth daily as needed for moderate constipation. 06/08/21   Virl Axe, MD  COMBIVENT RESPIMAT 20-100 MCG/ACT AERS respimat Inhale 1 puff into the lungs every 6 (six) hours as needed for shortness of breath. 06/12/18 08/19/19  [provider]  mometasone-formoterol (DULERA) 100-5 MCG/ACT AERO Inhale 2 puffs into the lungs daily. Patient not taking: Reported on 08/19/2019 04/17/18 08/19/19  Dana Allan I, MD  sucralfate (CARAFATE) 1 g tablet Take 1 tablet (1 g total) by mouth 4 (four) times daily. Patient not taking: Reported on 08/19/2019 04/17/18 08/19/19  Dana Allan I, MD  tiotropium (SPIRIVA HANDIHALER) 18 MCG inhalation  capsule Place 1 capsule (18 mcg total) into inhaler and inhale daily. Patient not taking: Reported on 08/19/2019 04/17/18 08/19/19  Dana Allan I, MD  traZODone (DESYREL) 50 MG tablet Take 50 mg by mouth at bedtime as needed for sleep. Patient not taking: Reported on 10/06/2020  10/08/20  [provider]    Family History Family History  Problem Relation Age of Onset   Heart disease Mother    Heart attack Father 47   Cancer Sister        breast   Colon cancer Neg Hx    Esophageal cancer Neg Hx    Rectal cancer Neg Hx    Stomach cancer Neg Hx     Social History Social History   Tobacco Use   Smoking status: Every Day    Packs/day: 0.50    Types: Cigarettes   Smokeless tobacco: Never   Tobacco comments:    1 ppd Wants Patches   Vaping Use   Vaping Use: Never used  Substance Use Topics   Alcohol use: Yes    Alcohol/week: 1.0 standard drink of alcohol    Types: 1 Cans of beer per week   Drug use: Not Currently    Types: Marijuana, Cocaine    Comment: past hx approx 30 years ago      Allergies   Penicillins   Review of Systems Review of Systems  Skin:  Positive for rash (cyst on chest wall).     Physical Exam Triage Vital Signs ED Triage Vitals  Enc Vitals Group     BP 11/22/21 1238 (!) 212/100     Pulse Rate 11/22/21 1238 87     Resp 11/22/21 1238 18     Temp 11/22/21 1238 98.4 F (36.9 C)     Temp src --      SpO2 11/22/21 1238 98 %     Weight --      Height --      Head Circumference --      Peak Flow --      Pain Score 11/22/21 1239 0     Pain Loc --      Pain Edu? --      Excl. in [place]? --    No data found.  Updated Vital Signs BP (!) 198/92   Pulse 87   Temp 98.4 F (36.9 C)   Resp 18   SpO2 98%   Visual Acuity Right Eye Distance:   Left Eye Distance:   Bilateral Distance:    Right Eye Near:   Left Eye Near:    Bilateral Near:     Physical Exam Constitutional:      Appearance:  Normal appearance.  Cardiovascular:      Rate and Rhythm: Normal rate and regular rhythm.     Heart sounds: Normal heart sounds.  Pulmonary:     Effort: Pulmonary effort is normal.     Breath sounds: Normal air entry. No wheezing, rhonchi or rales.  Skin:         Comments: Skin: There is a 2 x 3 cm abscess formation present at the upper medial aspect of the right sternoclavicular joint.  The area is red, and tender to palpation.  The majority of the area is hard with very little fluctuance.  There are some unusual markings that are associated with aided with the abscess that may indicate there may have been some type of suture repair done at this area.  Procedure that was performed is unknown at this time.  Neurological:     Mental Status: He is alert.      UC Treatments / Results  Labs (all labs ordered are listed, but only abnormal results are displayed) Labs Reviewed - No data to display  EKG   Radiology No results found.  Procedures Procedures (including critical care time)  Medications Ordered in UC Medications - No data to display  Initial Impression / Assessment and Plan / UC Course  I have reviewed the triage vital signs and the nursing notes.  Pertinent labs & imaging results that were available during my care of the patient were reviewed by me and considered in my medical decision making (see chart for details).    Plan: 1.  Advised to restart the losartan 50 mg 1 tablet daily to begin to get the hypertension under control again. 2.  Advised to take Bactrim DS 1 twice daily until completed to treat the cystic formation. 3.  Internal referral has been made to [facility] to see about having the cystic formation evaluated. 4.  PCP assistance has been contacted in order to have the patient established with an internist to see about managing his medical issues. 5.  Advised to return to urgent care as needed Final Clinical Impressions(s) / UC Diagnoses   Final diagnoses:  None     Discharge  Instructions      EvalI have scheduled an internal referral to surgeon to have the area waited and treated. I have also made an internal referral to schedule becoming established with a primary care physician to have your hypertension treated. Advised take the Bactrim DS 1 every 12 hours until completed to help treat the cystic formation. Advised to restart the losartan at 50 mg 1 tablet daily to treat the hypertension. Advised to follow-up with urgent care as needed.    ED Prescriptions     Medication Sig Dispense Auth. Provider   sulfamethoxazole-trimethoprim (BACTRIM DS) 800-160 MG tablet Take 1 tablet by mouth 2 (two) times daily for 7 days. 14 tablet Nyoka Lint, PA-C   losartan (COZAAR) 50 MG tablet Take 1 tablet (50 mg total) by mouth daily. 30 tablet Nyoka Lint, PA-C      PDMP not reviewed this encounter.   Nyoka Lint, PA-C 11/22/21 1315

## 2021-11-22 NOTE — ED Triage Notes (Signed)
Called no answer

## 2021-11-29 ENCOUNTER — Ambulatory Visit (HOSPITAL_COMMUNITY)
Admission: EM | Admit: 2021-11-29 | Discharge: 2021-11-29 | Disposition: A | Discharge status: Home / Self Care | Payer: Medicare Other | Attending: Family Medicine | Admitting: Family Medicine

## 2021-11-29 ENCOUNTER — Encounter (HOSPITAL_COMMUNITY): Payer: Self-pay | Admitting: Emergency Medicine

## 2021-11-29 DIAGNOSIS — I10 Essential (primary) hypertension: Secondary | ICD-10-CM | POA: Diagnosis not present

## 2021-11-29 DIAGNOSIS — L0291 Cutaneous abscess, unspecified: Secondary | ICD-10-CM

## 2021-11-29 DIAGNOSIS — L02818 Cutaneous abscess of other sites: Secondary | ICD-10-CM | POA: Diagnosis not present

## 2021-11-29 MED ORDER — LIDOCAINE-EPINEPHRINE 1 %-1:100000 IJ SOLN
INTRAMUSCULAR | Status: AC
  Filled 2021-11-29: qty 1

## 2021-11-29 MED ORDER — MUPIROCIN 2 % EX OINT: 1.0000 | TOPICAL_OINTMENT | Freq: Two times a day (BID) | CUTANEOUS | 0 refills | Status: DC

## 2021-11-29 MED ORDER — CEPHALEXIN 250 MG PO CAPS: 250.0000 mg | ORAL_CAPSULE | Freq: Three times a day (TID) | ORAL | 0 refills | Status: DC

## 2021-11-29 NOTE — Discharge Instructions (Signed)
Wash your wound with warm soapy water 2 times daily and apply new antibiotic ointment.  Put mupirocin ointment on the sore areas twice daily until improved  Take cephalexin 250 mg--1 capsule 3 times daily for 7 days  Call the internal medicine center for primary care follow-up for your blood pressure.  Call [facility] for an appointment for possible eventual removal of the sebaceous cyst

## 2021-11-29 NOTE — ED Provider Notes (Signed)
[place]    CSN: [id number] Arrival date & time: 11/29/21  1006      History   Chief Complaint Chief Complaint  Patient presents with   Skin Problem    HPI Gregory Noy Seann Genther. is a 76 y.o. male.   HPI Here with continued soreness and swelling in an area on his right upper chest.  He was seen here August 6 and prescribed Bactrim for cellulitis and a sebaceous cyst.  It is continued to be red and sore.  He did finish his antibiotics  He also was started on losartan for hypertension.  He is taking that daily.  Blood pressure is 158/78.  He now has someone with him here at the visit.  He states the patient does not know who to follow-up with for primary care.  He does have some encounters that are mostly orders and telephone encounters with internal medicine center in epic chart.    Past Medical History:  Diagnosis Date   Acute metabolic encephalopathy 8/0/9983   Acute metabolic encephalopathy 06/25/2503   Alcohol use 04/23/2020   Alcohol withdrawal seizure with complication, with unspecified complication ([place]) 3/97/6734   CAO (chronic airflow obstruction) (HCC)    Cataract    OD   CVA (cerebral vascular accident) ([place])    Depression    Diabetes mellitus without complication ([place])    Diabetic retinopathy ([place])    NPDR OU   GERD (gastroesophageal reflux disease)    if drinks alcohol   GI bleed 10/05/2020   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   Patient is approximately 5-week status post bunion correction left foot   Hyperlipidemia    Hypertension    Hypertensive retinopathy    OU   Hypoglycemia 10/22/2020   Malignant neoplasm of prostate ([place]) 01/09/2014   Neuropathy    Pancreatitis    Pneumococcal vaccination administered at current visit 11/10/2020   Prostate cancer ([place]) 12/19/2013   Gleason 4+3=7, volume 31.31 cc   Rhabdomyolysis 04/12/2021   Sciatica    Shortness of breath dyspnea    with exertion     Patient Active Problem List    Diagnosis Date Noted   Debility 04/15/2021   History of alcohol abuse 04/13/2021   Diabetic hypoglycemia ([place]) 04/12/2021   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/12/2021   Weakness of both legs    Adenomatous duodenal polyp    AVM (arteriovenous malformation) of duodenum, acquired with hemorrhage    Need for hepatitis C screening test 11/10/2020   Healthcare maintenance 11/06/2020   Acute on chronic anemia 10/22/2020   Angiodysplasia of intestine    Iron deficiency anemia 05/22/2020   Constipation 05/22/2020   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 ([place])    History of CVA (cerebrovascular accident) 04/23/2020   Hyperlipidemia associated with type 2 diabetes mellitus ([place]) 04/23/2020   CKD (chronic kidney disease) stage 3, GFR 30-59 ml/min ([place]) 04/23/2020   Tobacco use 04/23/2020   Seizure disorder ([place]) 11/08/2018   Hypertension associated with diabetes ([place]) 11/08/2018   Type 2 diabetes mellitus without complication ([place]) 19/37/9024   Symptomatic anemia 04/15/2018   Prostate cancer ([place]) 02/27/2014   Bunion 01/17/2013   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    Past Surgical History:  Procedure Laterality Date   BIOPSY  04/16/2018   Procedure: BIOPSY;  Surgeon: Juanita Craver, MD;  Location: [facility];  Service: Endoscopy;;   BIOPSY  05/23/2020   Procedure: BIOPSY;  Surgeon: Jackquline Denmark, MD;  Location: [facility];  Service: Gastroenterology;;  EGD and COLON   BIOPSY  12/09/2020   Procedure: BIOPSY;  Surgeon: Thornton Park, MD;  Location: [facility] ENDOSCOPY;  Service: Gastroenterology;;   biopsy on throat     hx of    CATARACT EXTRACTION Bilateral    Dr. Quentin Ore   COLONOSCOPY N/A 05/23/2020   Procedure: COLONOSCOPY;  Surgeon: Jackquline Denmark, MD;  Location: [facility];  Service: Gastroenterology;  Laterality: N/A;   ENTEROSCOPY N/A 10/07/2020   Procedure: ENTEROSCOPY;  Surgeon: [name], MD;  Location: [facility] ENDOSCOPY;  Service: Endoscopy;   Laterality: N/A;   ESOPHAGOGASTRODUODENOSCOPY Left 04/16/2018   Procedure: ESOPHAGOGASTRODUODENOSCOPY (EGD);  Surgeon: Juanita Craver, MD;  Location: [facility];  Service: Endoscopy;  Laterality: Left;   ESOPHAGOGASTRODUODENOSCOPY (EGD) WITH PROPOFOL N/A 05/23/2020   Procedure: ESOPHAGOGASTRODUODENOSCOPY (EGD) WITH PROPOFOL;  Surgeon: Jackquline Denmark, MD;  Location: [facility];  Service: Gastroenterology;  Laterality: N/A;   ESOPHAGOGASTRODUODENOSCOPY (EGD) WITH PROPOFOL N/A 12/09/2020   Procedure: ESOPHAGOGASTRODUODENOSCOPY (EGD) WITH PROPOFOL;  Surgeon: Thornton Park, MD;  Location: [place];  Service: Gastroenterology;  Laterality: N/A;   EYE SURGERY     FOOT SURGERY     HOT HEMOSTASIS N/A 04/16/2018   Procedure: HOT HEMOSTASIS (ARGON PLASMA COAGULATION/BICAP);  Surgeon: Juanita Craver, MD;  Location: [facility];  Service: Endoscopy;  Laterality: N/A;   HOT HEMOSTASIS N/A 05/23/2020   Procedure: HOT HEMOSTASIS (ARGON PLASMA COAGULATION/BICAP);  Surgeon: Jackquline Denmark, MD;  Location: [facility];  Service: Gastroenterology;  Laterality: N/A;   HOT HEMOSTASIS N/A 12/09/2020   Procedure: HOT HEMOSTASIS (ARGON PLASMA COAGULATION/BICAP);  Surgeon: Thornton Park, MD;  Location: [facility];  Service: Gastroenterology;  Laterality: N/A;   LYMPHADENECTOMY Bilateral 02/27/2014   Procedure: BILATERAL LYMPHADENECTOMY;  Surgeon: Alexis Frock, MD;  Location: [facility];  Service: Urology;  Laterality: Bilateral;   POLYPECTOMY  05/23/2020   Procedure: POLYPECTOMY;  Surgeon: Jackquline Denmark, MD;  Location: [facility];  Service: Gastroenterology;;   PROSTATE BIOPSY  12/2013   Gleason 4+3=7, volume 31.31 cc   ROBOT ASSISTED LAPAROSCOPIC RADICAL PROSTATECTOMY N/A 02/27/2014   Procedure: ROBOTIC ASSISTED LAPAROSCOPIC RADICAL PROSTATECTOMY WITH INDOCYANINE GREEN DYE;  Surgeon: Alexis Frock, MD;  Location: [facility];  Service: Urology;  Laterality: N/A;       Home Medications    Prior to Admission  medications   Medication Sig Start Date End Date Taking? Authorizing Provider  cephALEXin (KEFLEX) 250 MG capsule Take 1 capsule (250 mg total) by mouth 3 (three) times daily for 7 days. 11/29/21 12/06/21 Yes Barrett Henle, MD  mupirocin ointment (BACTROBAN) 2 % Apply 1 Application topically 2 (two) times daily. To affected area till better 11/29/21  Yes Scarlette Hogston, Gwenlyn Perking, MD  losartan (COZAAR) 50 MG tablet Take 1 tablet (50 mg total) by mouth daily. 11/22/21   Nyoka Lint, PA-C  COMBIVENT RESPIMAT 20-100 MCG/ACT AERS respimat Inhale 1 puff into the lungs every 6 (six) hours as needed for shortness of breath. 06/12/18 08/19/19  [provider]  mometasone-formoterol (DULERA) 100-5 MCG/ACT AERO Inhale 2 puffs into the lungs daily. Patient not taking: Reported on 08/19/2019 04/17/18 08/19/19  Dana Allan I, MD  sucralfate (CARAFATE) 1 g tablet Take 1 tablet (1 g total) by mouth 4 (four) times daily. Patient not taking: Reported on 08/19/2019 04/17/18 08/19/19  Dana Allan I, MD  tiotropium (SPIRIVA HANDIHALER) 18 MCG inhalation capsule Place 1 capsule (18 mcg total) into inhaler and inhale daily. Patient not taking: Reported on 08/19/2019 04/17/18 08/19/19  Ogbata,  Babs Bertin, MD  traZODone (DESYREL) 50 MG tablet Take 50 mg by mouth at bedtime as needed for sleep. Patient not taking: Reported on 10/06/2020  10/08/20  [provider]    Family History Family History  Problem Relation Age of Onset   Heart disease Mother    Heart attack Father 69   Cancer Sister        breast   Colon cancer Neg Hx    Esophageal cancer Neg Hx    Rectal cancer Neg Hx    Stomach cancer Neg Hx     Social History Social History   Tobacco Use   Smoking status: Every Day    Packs/day: 0.50    Types: Cigarettes   Smokeless tobacco: Never   Tobacco comments:    1 ppd Wants Patches   Vaping Use   Vaping Use: Never used  Substance Use Topics   Alcohol use: Yes    Alcohol/week: 1.0 standard  drink of alcohol    Types: 1 Cans of beer per week   Drug use: Not Currently    Types: Marijuana, Cocaine    Comment: past hx approx 30 years ago      Allergies   Penicillins   Review of Systems Review of Systems   Physical Exam Triage Vital Signs ED Triage Vitals  Enc Vitals Group     BP 11/29/21 1032 (!) 158/78     Pulse Rate 11/29/21 1032 95     Resp 11/29/21 1032 18     Temp 11/29/21 1032 98.6 F (37 C)     Temp src --      SpO2 11/29/21 1032 96 %     Weight --      Height --      Head Circumference --      Peak Flow --      Pain Score 11/29/21 1031 4     Pain Loc --      Pain Edu? --      Excl. in [place]? --    No data found.  Updated Vital Signs BP (!) 158/78 (BP Location: Right Arm)   Pulse 95   Temp 98.6 F (37 C)   Resp 18   SpO2 96%   Visual Acuity Right Eye Distance:   Left Eye Distance:   Bilateral Distance:    Right Eye Near:   Left Eye Near:    Bilateral Near:     Physical Exam Vitals and nursing note reviewed.  Constitutional:      General: He is not in acute distress.    Appearance: He is not ill-appearing, toxic-appearing or diaphoretic.  Cardiovascular:     Rate and Rhythm: Normal rate and regular rhythm.  Pulmonary:     Breath sounds: Normal breath sounds.  Skin:    Coloration: Skin is not pale.     Comments: There is an area of erythema and induration about 2 cm in diameter overlying his right sternoclavicular joint.  It is now boggy and fluctuant in the middle.  Neurological:     General: No focal deficit present.     Mental Status: He is alert and oriented to person, place, and time.  Psychiatric:        Behavior: Behavior normal.      UC Treatments / Results  Labs (all labs ordered are listed, but only abnormal results are displayed) Labs Reviewed - No data to display  EKG   Radiology No results found.  Procedures Procedures (  including critical care time)  Medications Ordered in UC Medications - No data to  display  Initial Impression / Assessment and Plan / UC Course  I have reviewed the triage vital signs and the nursing notes.  Pertinent labs & imaging results that were available during my care of the patient were reviewed by me and considered in my medical decision making (see chart for details).     Verbal consent is given, 1% lidocaine with epinephrine was used for infiltrative and anesthesia over the roof of the abscess.  Under clean conditions, #11 blade is used to make a stab wound about 1 cm long in the point for the abscess.  Approximately 5 mL of bloody purulent material was obtained.  Bandage was applied.  No complications. Wound care is explained.  I am giving him a printed letter from [facility] that says they tried to contact him about the referral to surgery.  Also I am making sure he is aware of who to call for his primary care.  He reports allergy to penicillins, consisting of feeling jittery and nervous when he takes it.  He is prescribed cephalexin today.  Final Clinical Impressions(s) / UC Diagnoses   Final diagnoses:  Abscess  Essential hypertension, benign     Discharge Instructions      Wash your wound with warm soapy water 2 times daily and apply new antibiotic ointment.  Put mupirocin ointment on the sore areas twice daily until improved  Take cephalexin 250 mg--1 capsule 3 times daily for 7 days  Call the internal medicine center for primary care follow-up for your blood pressure.  Call [facility] for an appointment for possible eventual removal of the sebaceous cyst     ED Prescriptions     Medication Sig Dispense Auth. Provider   cephALEXin (KEFLEX) 250 MG capsule Take 1 capsule (250 mg total) by mouth 3 (three) times daily for 7 days. 21 capsule Barrett Henle, MD   mupirocin ointment (BACTROBAN) 2 % Apply 1 Application topically 2 (two) times daily. To affected area till better 22 g Windy Carina Gwenlyn Perking, MD       PDMP not reviewed this encounter.   Barrett Henle, MD 11/29/21 [phone]

## 2021-11-29 NOTE — ED Triage Notes (Signed)
Pt reports that was seen here last Sunday for mass/knot on chest. Reports hurts when lays on area. Denies any drainage. Was given medications but unsure if related to mass.

## 2021-12-04 ENCOUNTER — Ambulatory Visit (INDEPENDENT_AMBULATORY_CARE_PROVIDER_SITE_OTHER): Payer: Medicare Other | Admitting: Internal Medicine

## 2021-12-04 ENCOUNTER — Other Ambulatory Visit: Payer: Self-pay

## 2021-12-04 ENCOUNTER — Encounter: Payer: Self-pay | Admitting: Internal Medicine

## 2021-12-04 VITALS — BP 125/62 | HR 88 | Temp 98.2°F | Resp 28 | Ht 71.0 in | Wt 171.2 lb

## 2021-12-04 DIAGNOSIS — F17210 Nicotine dependence, cigarettes, uncomplicated: Secondary | ICD-10-CM

## 2021-12-04 DIAGNOSIS — L02213 Cutaneous abscess of chest wall: Secondary | ICD-10-CM | POA: Insufficient documentation

## 2021-12-04 DIAGNOSIS — E1169 Type 2 diabetes mellitus with other specified complication: Secondary | ICD-10-CM

## 2021-12-04 DIAGNOSIS — Z8673 Personal history of transient ischemic attack (TIA), and cerebral infarction without residual deficits: Secondary | ICD-10-CM

## 2021-12-04 DIAGNOSIS — D132 Benign neoplasm of duodenum: Secondary | ICD-10-CM | POA: Diagnosis not present

## 2021-12-04 DIAGNOSIS — E785 Hyperlipidemia, unspecified: Secondary | ICD-10-CM

## 2021-12-04 DIAGNOSIS — Z87898 Personal history of other specified conditions: Secondary | ICD-10-CM

## 2021-12-04 DIAGNOSIS — I152 Hypertension secondary to endocrine disorders: Secondary | ICD-10-CM | POA: Diagnosis not present

## 2021-12-04 DIAGNOSIS — E1159 Type 2 diabetes mellitus with other circulatory complications: Secondary | ICD-10-CM

## 2021-12-04 DIAGNOSIS — B1920 Unspecified viral hepatitis C without hepatic coma: Secondary | ICD-10-CM | POA: Insufficient documentation

## 2021-12-04 DIAGNOSIS — G40909 Epilepsy, unspecified, not intractable, without status epilepticus: Secondary | ICD-10-CM

## 2021-12-04 DIAGNOSIS — E119 Type 2 diabetes mellitus without complications: Secondary | ICD-10-CM

## 2021-12-04 MED ORDER — LOSARTAN POTASSIUM 50 MG PO TABS: 50.0000 mg | ORAL_TABLET | Freq: Every day | ORAL | 2 refills | Status: DC

## 2021-12-04 MED ORDER — SALINE SPRAY 0.65 % NA SOLN: 1.0000 | NASAL | 0 refills | Status: DC | PRN

## 2021-12-04 MED ORDER — ATORVASTATIN CALCIUM 40 MG PO TABS: 40.0000 mg | ORAL_TABLET | Freq: Every day | ORAL | 2 refills | Status: DC

## 2021-12-04 MED ORDER — LEVETIRACETAM 500 MG PO TABS: 500.0000 mg | ORAL_TABLET | Freq: Two times a day (BID) | ORAL | 2 refills | Status: DC

## 2021-12-04 NOTE — Assessment & Plan Note (Signed)
Last HbA1c 4.5% in 03/2021. Disease course has been complicated by hospitalizations for unexplained hypoglycemic episodes. Not currently receiving medical therapy. Plan: Foot exam, HbA1c today.

## 2021-12-04 NOTE — Assessment & Plan Note (Signed)
Patient has noticed intermittent epistaxis that he thinks may be associated with dry nares due to allergies. He does not get these episodes frequently. He does not sleep with a fan on. Plan:Will trial patient on saline nasal spray.

## 2021-12-04 NOTE — Assessment & Plan Note (Addendum)
Patient is due for annual colonoscopy per Health Care Gaps. Plan:Referral to GI placed. [name], his niece, requests that she be contacted if he does not answer to help facilitate scheduling. Her number is in the chart.

## 2021-12-04 NOTE — Assessment & Plan Note (Signed)
Based on interim notes with urgent care since last OV in 05/2021 it seems that patient thought he was to discontinue all anti-hypertensives. He was restarted on losartan at [facility]. BP is controlled today at 125/62. Of note in his medicine bag he did have his old prescriptions for losartan 100 mg daily and amlodipine 10 mg daily but states he has not been taking them.  Plan:Continue losartan 50 mg daily. Patient's niece will discard old prescriptions to avoid him taking multiple or wrong medications.

## 2021-12-04 NOTE — Progress Notes (Signed)
CC: UC follow-up  HPI:  Gregory Nunez. is a 76 y.o. person with past medical history as detailed below who presents today for UC follow up for chest wall abscess and medication refills. Please see problem based charting for detailed assessment and plan.  Past Medical History:  Diagnosis Date   Acute metabolic encephalopathy 06/20/1960   Acute metabolic encephalopathy 05/21/9796   Alcohol use 04/23/2020   Alcohol withdrawal seizure with complication, with unspecified complication ([place]) 01/08/1940   CAO (chronic airflow obstruction) (HCC)    Cataract    OD   CVA (cerebral vascular accident) ([place])    Depression    Diabetes mellitus without complication ([place])    Diabetic retinopathy ([place])    NPDR OU   GERD (gastroesophageal reflux disease)    if drinks alcohol   GI bleed 10/05/2020   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   Patient is approximately 5-week status post bunion correction left foot   Hyperlipidemia    Hypertension    Hypertensive retinopathy    OU   Hypoglycemia 10/22/2020   Malignant neoplasm of prostate ([place]) 01/09/2014   Neuropathy    Pancreatitis    Pneumococcal vaccination administered at current visit 11/10/2020   Prostate cancer ([place]) 12/19/2013   Gleason 4+3=7, 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  Physical Exam:  Vitals:   12/04/21 0916  BP: 125/62  Pulse: 88  Resp: (!) 28  Temp: 98.2 F (36.8 C)  TempSrc: Oral  SpO2: 99%  Weight: 171 lb 3.2 oz (77.7 kg)  Height: '5\' 11"'$  (1.803 m)   Constitutional:Appears stated age, in no acute distress. Cardio:Regular rate and rhythm. Pulm:Clear to auscultation bilaterally. Normal work of breathing on room air. Chest:Healing, 2.5 cm poorly defined healing lesion that is hyperpigmented but without increased erythema or warmth as compared to surrounding skin. No fluctuance  appreciated. There is a scar from recent I&D centrally located over the lesion that appears healed. No tenderness, no drainage. DEY:CXKGYJEH for extremity edema. Skin:Warm and dry with changes noted above. Neuro:Alert and oriented. Speech is mildly dysarthric but understandable. Psych:Pleasant mood and affect.  Assessment & Plan:   See Encounters Tab for problem based charting.  Abscess of chest wall Patient was seen on 08/06 and 08/13 by urgent care for uncontrolled hypertension and abscess on his chest wall. He was started on Bactrim 08/06 for cellulitis and sebaceous cyst and despite therapy had continued erythema and soreness of the area. He was further started on cephalexin for 7 days which he should finish on 08/21. The abscess was also drained at the urgent care.  Since having the abscess drained he has not had recurrence of pain or redness. He denies fever or chills. The lesion appears to be healing well with no increased warmth or erythema surrounding the hyperpigmented round lesion with healed scar from I&D. It is not tender. No fluctuance noted. Assessment:Lesion appears to be healing well. He has a few more days of antibiotics to complete that he was given at the urgent care. No concern for local or systemic infection at this time. Plan:Patient counseled to finish antibiotic course and contact our office if the lesion increases in size, becomes painful again, begins to drain, or if he develops fever or chills.  Hypertension associated with diabetes ([place]) Based on interim notes with urgent care since last  OV in 05/2021 it seems that patient thought he was to discontinue all anti-hypertensives. He was restarted on losartan at [facility]. BP is controlled today at 125/62. Of note in his medicine bag he did have his old prescriptions for losartan 100 mg daily and amlodipine 10 mg daily but states he has not been taking them.  Plan:Continue losartan 50 mg daily. Patient's niece will discard old  prescriptions to avoid him taking multiple or wrong medications.  Type 2 diabetes mellitus without complication (HCC) Last XBW6O 4.5% in 03/2021. Disease course has been complicated by hospitalizations for unexplained hypoglycemic episodes. Not currently receiving medical therapy. Plan: Foot exam, HbA1c today.  Hyperlipidemia associated with type 2 diabetes mellitus ([place]) Initiated on Lipitor 40 mg daily in addition to aspirin 81 mg daily for secondary prevention of ASCVD. Patient is taking this medication. Plan:Continue current therapy.  Adenomatous duodenal polyp Patient is due for annual colonoscopy per Health Care Gaps. Plan:Referral to GI placed. [name], his niece, requests that she be contacted if he does not answer to help facilitate scheduling. Her number is in the chart.  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 RUQ ultrasound showed diffusely increased echogenicity of the hepatic parenchyma with no obvious focal hepatic lesions visualized. Does not appear that he ever received treatment for HCV. Plan:Referral to Dr. Johnnye Sima with ID placed. [name], his niece, requests that she be contacted if he does not answer to help facilitate scheduling. Her number is in the chart. Will repeat CMP and order repeat RUQ ultrasound today in addition to hepatitis C labs. Will also obtain CBC.  Seizure disorder ([place]) Keppra refill sent in.  History of epistaxis Patient has noticed intermittent epistaxis that he thinks may be associated with dry nares due to allergies. He does not get these episodes frequently. He does not sleep with a fan on. Plan:Will trial patient on saline nasal spray.  Patient discussed with Dr.  Saverio Danker

## 2021-12-04 NOTE — Assessment & Plan Note (Signed)
Patient was seen on 08/06 and 08/13 by urgent care for uncontrolled hypertension and abscess on his chest wall. He was started on Bactrim 08/06 for cellulitis and sebaceous cyst and despite therapy had continued erythema and soreness of the area. He was further started on cephalexin for 7 days which he should finish on 08/21. The abscess was also drained at the urgent care.  Since having the abscess drained he has not had recurrence of pain or redness. He denies fever or chills. The lesion appears to be healing well with no increased warmth or erythema surrounding the hyperpigmented round lesion with healed scar from I&D. It is not tender. No fluctuance noted. Assessment:Lesion appears to be healing well. He has a few more days of antibiotics to complete that he was given at the urgent care. No concern for local or systemic infection at this time. Plan:Patient counseled to finish antibiotic course and contact our office if the lesion increases in size, becomes painful again, begins to drain, or if he develops fever or chills.

## 2021-12-04 NOTE — Assessment & Plan Note (Signed)
Initiated on Lipitor 40 mg daily in addition to aspirin 81 mg daily for secondary prevention of ASCVD. Patient is taking this medication. Plan:Continue current therapy.

## 2021-12-04 NOTE — Patient Instructions (Signed)
[name],  It was nice seeing you today! Thank you for choosing [facility] for your Primary Care.    Today we talked about:   Chest wound: I am glad that you are no longer having trouble related to this wound. Make sure to finish the course of cephalexin that you were prescribed by the urgent care. High blood pressure: Your blood pressure looked great today! Continue taking losartan 50 mg daily. Diabetes: I am checking your HbA1c today to monitor your diabetes. Chronic kidney disease: I am checking labs to monitor your kidney function. Hepatitis C positive: Your hepatitis C test was positive last year. I am checking your liver labs today and ordering a repeat ultrasound of your liver, as well as referring you to our Infectious Disease doctor, Dr. Johnnye Sima.  Please return to clinic in 3 months.  My best, Dr. Marlou Sa

## 2021-12-04 NOTE — Assessment & Plan Note (Signed)
Keppra refill sent in.

## 2021-12-04 NOTE — Assessment & Plan Note (Addendum)
Patient noted to be positive for hepatitis C in 10/2020 with labs showing HCV antibody >11.0, RNA quant 40,500 and log10 4.607. RUQ ultrasound showed diffusely increased echogenicity of the hepatic parenchyma with no obvious focal hepatic lesions visualized. Does not appear that he ever received treatment for HCV. Plan:Referral to Dr. Johnnye Nunez with ID placed. Gregory Nunez, his niece, requests that she be contacted if he does not answer to help facilitate scheduling. Her number is in the chart. Will repeat CMP and order repeat RUQ ultrasound today in addition to hepatitis C labs. Will also obtain CBC.

## 2021-12-05 LAB — CMP14 + ANION GAP
ALT: 42 IU/L (ref 0–44)
AST: 43 IU/L — ABNORMAL HIGH (ref 0–40)
Albumin/Globulin Ratio: 0.9 — ABNORMAL LOW (ref 1.2–2.2)
Albumin: 3.5 g/dL — ABNORMAL LOW (ref 3.8–4.8)
Alkaline Phosphatase: 81 IU/L (ref 44–121)
Anion Gap: 14 mmol/L (ref 10.0–18.0)
BUN/Creatinine Ratio: 9 — ABNORMAL LOW (ref 10–24)
BUN: 23 mg/dL (ref 8–27)
Bilirubin Total: <0.2 mg/dL (ref 0.0–1.2)
CO2: 18 mmol/L — ABNORMAL LOW (ref 20–29)
Calcium: 8.5 mg/dL — ABNORMAL LOW (ref 8.6–10.2)
Chloride: 108 mmol/L — ABNORMAL HIGH (ref 96–106)
Creatinine, Ser: 2.51 mg/dL — ABNORMAL HIGH (ref 0.76–1.27)
Globulin, Total: 4 g/dL (ref 1.5–4.5)
Glucose: 77 mg/dL (ref 70–99)
Potassium: 4.1 mmol/L (ref 3.5–5.2)
Sodium: 140 mmol/L (ref 134–144)
Total Protein: 7.5 g/dL (ref 6.0–8.5)
eGFR: 26 mL/min/{1.73_m2} — ABNORMAL LOW (ref >59)

## 2021-12-05 LAB — HCV AB W REFLEX TO QUANT PCR: HCV Ab: REACTIVE — AB

## 2021-12-05 LAB — HCV RT-PCR, QUANT (NON-GRAPH)
HCV log10: 4.061 log10 IU/mL
Hepatitis C Quantitation: 11500 IU/mL

## 2021-12-05 LAB — HEMOGLOBIN A1C
Est. average glucose Bld gHb Est-mCnc: 157 mg/dL
Hgb A1c MFr Bld: 7.1 % — ABNORMAL HIGH (ref 4.8–5.6)

## 2021-12-07 ENCOUNTER — Telehealth: Payer: Self-pay | Admitting: Internal Medicine

## 2021-12-07 DIAGNOSIS — N183 Chronic kidney disease, stage 3 unspecified: Secondary | ICD-10-CM

## 2021-12-07 NOTE — Telephone Encounter (Signed)
After unsuccessful attempt to reach Mr. Tissue I contacted his niece [name] per conversation at his last OV. I let her know that his kidney labs were abnormal and that I would like him to come in for a lab only visit to recheck those. I have advised that he stay hydrated prior to this visit. She voiced understanding.  I have reached out to the front desk to help facilitate this scheduling.   Farrel Gordon, DO

## 2021-12-07 NOTE — Progress Notes (Signed)
Internal Medicine Clinic Attending ? ?Case discussed with Dr. Dean  At the time of the visit.  We reviewed the resident?s history and exam and pertinent patient test results.  I agree with the assessment, diagnosis, and plan of care documented in the resident?s note.  ?

## 2021-12-14 ENCOUNTER — Other Ambulatory Visit (INDEPENDENT_AMBULATORY_CARE_PROVIDER_SITE_OTHER): Payer: Medicare Other

## 2021-12-14 DIAGNOSIS — N183 Chronic kidney disease, stage 3 unspecified: Secondary | ICD-10-CM

## 2021-12-14 NOTE — Addendum Note (Signed)
Addended by: Marcelino Duster on: 12/14/2021 10:06 AM   Modules accepted: Orders

## 2021-12-15 LAB — BMP8+ANION GAP
Anion Gap: 18 mmol/L (ref 10.0–18.0)
BUN/Creatinine Ratio: 12 (ref 10–24)
BUN: 24 mg/dL (ref 8–27)
CO2: 17 mmol/L — ABNORMAL LOW (ref 20–29)
Calcium: 9.1 mg/dL (ref 8.6–10.2)
Chloride: 106 mmol/L (ref 96–106)
Creatinine, Ser: 1.95 mg/dL — ABNORMAL HIGH (ref 0.76–1.27)
Glucose: 172 mg/dL — ABNORMAL HIGH (ref 70–99)
Potassium: 3.8 mmol/L (ref 3.5–5.2)
Sodium: 141 mmol/L (ref 134–144)
eGFR: 35 mL/min/{1.73_m2} — ABNORMAL LOW (ref >59)

## 2021-12-17 ENCOUNTER — Telehealth: Payer: Self-pay | Admitting: Internal Medicine

## 2021-12-17 NOTE — Telephone Encounter (Signed)
Spoke with Mr. Stitely niece, [name] regarding lab results from recent BMP. I let her know that his kidney function is improving though still not where we would like to see it. I explained that one of the antibiotics that he was previously taking (Bactrim) may have caused insult to his kidneys, but that I also want to make sure that restarting losartan after several months without taking it is not the culprit. For this reason I would like to have him come to clinic in about 2 weeks for repeat BMP and urine protein studies. We will assess BP control at that time as well. She voices understanding.  I will reach out to the front desk to help schedule this visit.  Farrel Gordon, DO

## 2021-12-22 ENCOUNTER — Ambulatory Visit (HOSPITAL_COMMUNITY): Admission: RE | Admit: 2021-12-22 | Payer: Medicare Other | Source: Ambulatory Visit

## 2021-12-31 ENCOUNTER — Encounter: Payer: Self-pay | Admitting: Internal Medicine

## 2022-01-04 NOTE — Progress Notes (Unsigned)
[facility]  PATIENT: Gregory Nunez. DOB: March 28, 1946  REFERRING CLINICIAN: Farrel Gordon, DO HISTORY FROM: patient  REASON FOR VISIT: stroke and seizure f/u   HISTORICAL  CHIEF COMPLAINT:  Chief Complaint  Patient presents with   Follow-up    RM 3 alone Pt is well and stable ,no sz or new stroke concerns since last visit     HISTORY OF PRESENT ILLNESS:   76 year old male here for evaluation of stroke and seizure.   Update 01/05/2022 JM: Patient returns for 1 year follow-up. He is unaccompanied today, caregiver waiting in lobby.  Doing well from a neurological standpoint.  Denies any new stroke/TIA symptoms or seizure activity.  Does mention issues with his speech and language since his stroke.  Ambulates with a cane, denies any recent falls.  Has a caregiver that assists him with IADLs including medication management.  Remains on Keppra, denies side effects.  Remains on aspirin and atorvastatin, denies side effects.  Blood pressures today elevated even on repeat, asymptomatic, does not routinely monitor at home. Closely followed by PCP.  Denies alcohol  or drug use, tobacco use about 2 packs/week. Is interested in quitting.  No further concerns at this time     Consult visit 01/05/2021 Dr. Leta Baptist:   July 2020 patient presented to hospital for witnessed seizure, possibly related to alcohol withdrawal.  January 2022 patient presented to hospital for right-sided weakness and shakiness.  Patient was diagnosed with stroke and completed work-up.  June 2022 patient presented to hospital for syncope and visual scintillations.  Abnormal EEG slowing was noted.  Patient was discharged on antiseizure medication.  Since that time patient is doing well.  No further seizures or syncope.  No more strokelike symptoms.  He stayed away from alcohol.   REVIEW OF SYSTEMS: Full 14 system review of systems performed and negative with exception of: as per HPI.    ALLERGIES: Allergies  Allergen Reactions   Penicillins Anxiety and Other (See Comments)    DID THE REACTION INVOLVE: Swelling of the face/tongue/throat, SOB, or low BP? No Sudden or severe rash/hives, skin peeling, or the inside of the mouth or nose? No Did it require medical treatment? No When did it last happen?      many years ago If all above answers are "NO", may proceed with cephalosporin use.       HOME MEDICATIONS: Outpatient Medications Prior to Visit  Medication Sig Dispense Refill   aspirin EC 81 MG tablet Take 81 mg by mouth daily. Swallow whole.     atorvastatin (LIPITOR) 40 MG tablet Take 1 tablet (40 mg total) by mouth daily. 90 tablet 2   levETIRAcetam (KEPPRA) 500 MG tablet Take 1 tablet (500 mg total) by mouth 2 (two) times daily. 180 tablet 2   losartan (COZAAR) 50 MG tablet Take 1 tablet (50 mg total) by mouth daily. 90 tablet 2   mupirocin ointment (BACTROBAN) 2 % Apply 1 Application topically 2 (two) times daily. To affected area till better 22 g 0   sodium chloride (OCEAN) 0.65 % SOLN nasal spray Place 1 spray into both nostrils as needed for congestion. 15 mL 0   No facility-administered medications prior to visit.    PAST MEDICAL HISTORY: Past Medical History:  Diagnosis Date   Acute metabolic encephalopathy 05/29/7987   Acute metabolic encephalopathy 05/21/1939   Alcohol use 04/23/2020   Alcohol withdrawal seizure with complication, with unspecified complication ([place]) 7/40/8144   CAO (chronic airflow obstruction) ([place])  Cataract    OD   CVA (cerebral vascular accident) ([place])    Depression    Diabetes mellitus without complication ([place])    Diabetic retinopathy ([place])    NPDR OU   GERD (gastroesophageal reflux disease)    if drinks alcohol   GI bleed 10/05/2020   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   Patient is approximately 5-week status post bunion correction left foot   Hyperlipidemia    Hypertension    Hypertensive retinopathy    OU    Hypoglycemia 10/22/2020   Malignant neoplasm of prostate ([place]) 01/09/2014   Neuropathy    Pancreatitis    Pneumococcal vaccination administered at current visit 11/10/2020   Prostate cancer ([place]) 12/19/2013   Gleason 4+3=7, volume 31.31 cc   Rhabdomyolysis 04/12/2021   Sciatica    Shortness of breath dyspnea    with exertion     PAST SURGICAL HISTORY: Past Surgical History:  Procedure Laterality Date   BIOPSY  04/16/2018   Procedure: BIOPSY;  Surgeon: Juanita Craver, MD;  Location: [facility];  Service: Endoscopy;;   BIOPSY  05/23/2020   Procedure: BIOPSY;  Surgeon: Jackquline Denmark, MD;  Location: [facility];  Service: Gastroenterology;;  EGD and COLON   BIOPSY  12/09/2020   Procedure: BIOPSY;  Surgeon: Thornton Park, MD;  Location: [facility]COPY;  Service: Gastroenterology;;   biopsy on throat     hx of    CATARACT EXTRACTION Bilateral    Dr. Quentin Ore   COLONOSCOPY N/A 05/23/2020   Procedure: COLONOSCOPY;  Surgeon: Jackquline Denmark, MD;  Location: [facility];  Service: Gastroenterology;  Laterality: N/A;   ENTEROSCOPY N/A 10/07/2020   Procedure: ENTEROSCOPY;  Surgeon: [name], MD;  Location: [facility] ENDOSCOPY;  Service: Endoscopy;  Laterality: N/A;   ESOPHAGOGASTRODUODENOSCOPY Left 04/16/2018   Procedure: ESOPHAGOGASTRODUODENOSCOPY (EGD);  Surgeon: Juanita Craver, MD;  Location: [facility];  Service: Endoscopy;  Laterality: Left;   ESOPHAGOGASTRODUODENOSCOPY (EGD) WITH PROPOFOL N/A 05/23/2020   Procedure: ESOPHAGOGASTRODUODENOSCOPY (EGD) WITH PROPOFOL;  Surgeon: Jackquline Denmark, MD;  Location: [facility];  Service: Gastroenterology;  Laterality: N/A;   ESOPHAGOGASTRODUODENOSCOPY (EGD) WITH PROPOFOL N/A 12/09/2020   Procedure: ESOPHAGOGASTRODUODENOSCOPY (EGD) WITH PROPOFOL;  Surgeon: Thornton Park, MD;  Location: [facility];  Service: Gastroenterology;  Laterality: N/A;   EYE SURGERY     FOOT SURGERY     HOT HEMOSTASIS N/A 04/16/2018   Procedure: HOT HEMOSTASIS (ARGON PLASMA  COAGULATION/BICAP);  Surgeon: Juanita Craver, MD;  Location: [facility];  Service: Endoscopy;  Laterality: N/A;   HOT HEMOSTASIS N/A 05/23/2020   Procedure: HOT HEMOSTASIS (ARGON PLASMA COAGULATION/BICAP);  Surgeon: Jackquline Denmark, MD;  Location: [facility];  Service: Gastroenterology;  Laterality: N/A;   HOT HEMOSTASIS N/A 12/09/2020   Procedure: HOT HEMOSTASIS (ARGON PLASMA COAGULATION/BICAP);  Surgeon: Thornton Park, MD;  Location: [facility];  Service: Gastroenterology;  Laterality: N/A;   LYMPHADENECTOMY Bilateral 02/27/2014   Procedure: BILATERAL LYMPHADENECTOMY;  Surgeon: Alexis Frock, MD;  Location: [facility];  Service: Urology;  Laterality: Bilateral;   POLYPECTOMY  05/23/2020   Procedure: POLYPECTOMY;  Surgeon: Jackquline Denmark, MD;  Location: [facility];  Service: Gastroenterology;;   PROSTATE BIOPSY  12/2013   Gleason 4+3=7, volume 31.31 cc   ROBOT ASSISTED LAPAROSCOPIC RADICAL PROSTATECTOMY N/A 02/27/2014   Procedure: ROBOTIC ASSISTED LAPAROSCOPIC RADICAL PROSTATECTOMY WITH INDOCYANINE GREEN DYE;  Surgeon: Alexis Frock, MD;  Location: [facility];  Service: Urology;  Laterality: N/A;    FAMILY HISTORY: Family History  Problem Relation Age of Onset   Heart disease Mother  Heart attack Father 37   Cancer Sister        breast   Colon cancer Neg Hx    Esophageal cancer Neg Hx    Rectal cancer Neg Hx    Stomach cancer Neg Hx     SOCIAL HISTORY: Social History   Socioeconomic History   Marital status: Married    Spouse name: Not on file   Number of children: Not on file   Years of education: Not on file   Highest education level: Not on file  Occupational History   Not on file  Tobacco Use   Smoking status: Every Day    Packs/day: 0.50    Types: Cigarettes   Smokeless tobacco: Never   Tobacco comments:    1 ppd Wants Patches .  Stopped 1.5 weeks ago   Vaping Use   Vaping Use: Never used  Substance and Sexual Activity   Alcohol use: Yes    Alcohol/week: 1.0  standard drink of alcohol    Types: 1 Cans of beer per week   Drug use: Not Currently    Types: Marijuana, Cocaine    Comment: past hx approx 30 years ago    Sexual activity: Not on file  Other Topics Concern   Not on file  Social History Narrative   Lives with sister   Occas tea   Social Determinants of Health   Financial Resource Strain: Not on file  Food Insecurity: Not on file  Transportation Needs: Not on file  Physical Activity: Not on file  Stress: Not on file  Social Connections: Not on file  Intimate Partner Violence: Not on file     PHYSICAL EXAM  GENERAL EXAM/CONSTITUTIONAL: Vitals:  Vitals:   01/05/22 1043  BP: (!) 175/101  Pulse: 99  Weight: 167 lb (75.8 kg)  Height: '5\' 11"'$  (1.803 m)    Body mass index is 23.29 kg/m. Wt Readings from Last 3 Encounters:  01/05/22 167 lb (75.8 kg)  12/04/21 171 lb 3.2 oz (77.7 kg)  06/08/21 171 lb 12.8 oz (77.9 kg)   Patient is in no distress; well developed, nourished and groomed; neck is supple  CARDIOVASCULAR: Examination of carotid arteries is normal; no carotid bruits Regular rate and rhythm, no murmurs Examination of peripheral vascular system by observation and palpation is normal  EYES: Ophthalmoscopic exam of optic discs and posterior segments is normal; no papilledema or hemorrhages No results found.  MUSCULOSKELETAL: Gait, strength, tone, movements noted in Neurologic exam below  NEUROLOGIC: MENTAL STATUS:      No data to display         awake, alert, oriented to person, place and time recent and remote memory intact normal attention and concentration Stuttering type speech with hesitancy and occasional word finding, fluctuates during visit  fund of knowledge appropriate  CRANIAL NERVE:  2nd - no papilledema on fundoscopic exam 2nd, 3rd, 4th, 6th - pupils equal and reactive to light, visual fields full to confrontation, extraocular muscles intact, no nystagmus 5th - facial sensation  symmetric 7th - facial strength symmetric 8th - hearing intact 9th - palate elevates symmetrically, uvula midline 11th - shoulder shrug symmetric 12th - tongue protrusion midline MILD DYSARTHRIA  MOTOR:  normal bulk and tone, full strength in the BUE, BLE RIGHT ARM SLOWER THAN LEFT (LEFT ARM ORBITS AROUND RIGHT)  SENSORY:  normal and symmetric to light touch, temperature, vibration  COORDINATION:  finger-nose-finger, fine finger movements normal  REFLEXES:  deep tendon reflexes TRACE and symmetric  GAIT/STATION:  narrow based gait with use of cane; CAUTIOUS     DIAGNOSTIC DATA (LABS, IMAGING, TESTING) - I reviewed patient records, labs, notes, testing and imaging myself where available.  Lab Results  Component Value Date   WBC 4.8 04/15/2021   HGB 8.0 (L) 04/15/2021   HCT 23.3 (L) 04/15/2021   MCV 93.2 04/15/2021   PLT 171 04/15/2021      Component Value Date/Time   NA 141 12/14/2021 0932   K 3.8 12/14/2021 0932   CL 106 12/14/2021 0932   CO2 17 (L) 12/14/2021 0932   GLUCOSE 172 (H) 12/14/2021 0932   GLUCOSE 118 (H) 04/15/2021 0517   BUN 24 12/14/2021 0932   CREATININE 1.95 (H) 12/14/2021 0932   CREATININE 1.52 (H) 08/28/2020 1427   CALCIUM 9.1 12/14/2021 0932   PROT 7.5 12/04/2021 1014   ALBUMIN 3.5 (L) 12/04/2021 1014   AST 43 (H) 12/04/2021 1014   ALT 42 12/04/2021 1014   ALKPHOS 81 12/04/2021 1014   BILITOT <0.2 12/04/2021 1014   GFRNONAA 56 (L) 04/15/2021 0517   GFRNONAA 45 (L) 08/28/2020 1427   GFRAA 52 (L) 08/28/2020 1427   Lab Results  Component Value Date   CHOL 124 11/06/2020   HDL 42 11/06/2020   LDLCALC 57 11/06/2020   TRIG 142 11/06/2020   CHOLHDL 3.0 11/06/2020   Lab Results  Component Value Date   HGBA1C 7.1 (H) 12/04/2021   Lab Results  Component Value Date   KPTWSFKC12 751 04/13/2021   Lab Results  Component Value Date   TSH 3.758 04/13/2021    10/07/20 EEG - This study is suggestive of nonspecific cortical dysfunction  in left temporal region.  No seizures or epileptiform discharges were seen throughout the recording.  04/23/20 MRI HEAD IMPRESSION: 1. 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 2. Additional 5 mm subacute ischemic nonhemorrhagic right periatrial white matter infarct. 3. 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   04/23/20 MRA HEAD IMPRESSION: 1. Motion degraded exam. 2. Negative intracranial MRA for large vessel occlusion. 3. 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 No other hemodynamically significant or correctable stenosis identified.   04/23/20 MRA NECK IMPRESSION: 1. Wide patency of both carotid artery systems within the neck. 2. Single short-segment moderate stenosis involving the pre foraminal left V1 segment. Vertebral arteries otherwise widely patent within the neck. Left vertebral artery dominant.   04/24/20 TTE - Conclusion(s)/Recommendation(s): No intracardiac source of embolism  detected on this transthoracic study. A transesophageal echocardiogram is  recommended to exclude cardiac source of embolism if clinically indicated.     ASSESSMENT AND PLAN  76 y.o. year old male here with stroke and seizures, history of alcohol abuse.   Dx:  1. Left basal ganglia embolic stroke (HCC)   2. Seizure disorder ([place])   3. Speech and language deficits       PLAN:  STROKE PREVENTION -Referral placed to SLP for speech/language difficulties, question some underlying anxiety contributing as speech did improve some towards end of visit. Reports present since his prior stroke - aspirin '81mg'$  daily, statin, BP control - highly encourage routine monitoring at home, elevated today, asymptomatic, recommend  follow-up with PCP for further evaluation/treatment options -Discussed importance of complete tobacco cessation as continued use increases risk of additional strokes, encouraged him to further discuss cessation  options with PCP   SEIZURE PREVENTION (last in June 2022) - levetiracetam '500mg'$  twice a day - refill provided by PCP -Discussed avoidance of seizure provoking triggers and importance of medication compliance   No orders of the defined types were placed in this encounter.   Return in about 1 year (around 01/06/2023).    I spent 31 minutes of face-to-face and non-face-to-face time with patient.  This included previsit chart review, lab review, study review, order entry, electronic health record documentation, patient education and discussion regarding above diagnoses and treatment plan and answered all questions to patient's satisfaction  Frann Rider, [facility]  [facility] [address] [place], [place] [address]  Phone [phone] Fax [phone] Note: This document was prepared with digital dictation and possible smart phrase technology. Any transcriptional errors that result from this process are unintentional.

## 2022-01-05 ENCOUNTER — Encounter: Payer: Self-pay | Admitting: Adult Health

## 2022-01-05 ENCOUNTER — Ambulatory Visit (INDEPENDENT_AMBULATORY_CARE_PROVIDER_SITE_OTHER): Payer: Medicare Other | Admitting: Adult Health

## 2022-01-05 VITALS — BP 175/101 | HR 99 | Ht 71.0 in | Wt 167.0 lb

## 2022-01-05 DIAGNOSIS — I639 Cerebral infarction, unspecified: Secondary | ICD-10-CM | POA: Diagnosis not present

## 2022-01-05 DIAGNOSIS — G40909 Epilepsy, unspecified, not intractable, without status epilepticus: Secondary | ICD-10-CM

## 2022-01-05 DIAGNOSIS — F809 Developmental disorder of speech and language, unspecified: Secondary | ICD-10-CM

## 2022-01-05 NOTE — Patient Instructions (Addendum)
Referral placed to our neuro rehab for speech therapy - if you do not hear from them by the beginning of next week, please call them to schedule (contact information included in this packet)  Continue Keppra 500 mg twice daily for seizure prevention  Continue aspirin 81 mg daily  and atorvastatin for secondary stroke prevention managed/monitored by PCP (primary care provider)  Please follow up with your PCP Dr. Marlou Sa regarding smoking cessation and blood pressure control. Would recommend obtaining a blood pressure cuff at home and routinely monitor at home, if blood pressure remains elevated or your start to experience a severe headache, please proceed to ED for more urgent evaluation  Continue to follow up with PCP regarding blood pressure, cholesterol and diabetes management  Maintain strict control of hypertension with blood pressure goal below 130/90, diabetes with hemoglobin A1c goal below 7.0 % and cholesterol with LDL cholesterol (bad cholesterol) goal below 70 mg/dL.   Signs of a Stroke? Follow the BEFAST method:  Balance Watch for a sudden loss of balance, trouble with coordination or vertigo Eyes Is there a sudden loss of vision in one or both eyes? Or double vision?  Face: Ask the person to smile. Does one side of the face droop or is it numb?  Arms: Ask the person to raise both arms. Does one arm drift downward? Is there weakness or numbness of a leg? Speech: Ask the person to repeat a simple phrase. Does the speech sound slurred/strange? Is the person confused ? Time: If you observe any of these signs, call 911.     Followup in the future with me in 1 year or call earlier if needed       Thank you for coming to see [place] at [facility]. I hope we have been able to provide you high quality care today.  You may receive a patient satisfaction survey over the next few weeks. We would appreciate your feedback and comments so that we may continue to improve  ourselves and the health of our patients.

## 2022-01-11 ENCOUNTER — Ambulatory Visit: Payer: Medicare Other | Attending: Adult Health | Admitting: Speech Pathology

## 2022-01-11 ENCOUNTER — Encounter: Payer: Self-pay | Admitting: Speech Pathology

## 2022-01-11 DIAGNOSIS — R482 Apraxia: Secondary | ICD-10-CM | POA: Diagnosis present

## 2022-01-11 DIAGNOSIS — R4701 Aphasia: Secondary | ICD-10-CM | POA: Diagnosis present

## 2022-01-11 NOTE — Therapy (Unsigned)
OUTPATIENT SPEECH LANGUAGE PATHOLOGY APHASIA EVALUATION   Patient Name: Gregory Nunez. MRN: [id number] DOB:26-Apr-1945, 76 y.o., male Today's Date: 01/11/2022  PCP: Farrel Gordon, DO REFERRING PROVIDER: Frann Rider, NP   End of Session - 01/11/22 1310     Visit Number 1    Number of Visits 25    Date for SLP Re-Evaluation 04/05/22    Authorization Type [employer]    Progress Note Due on Visit 10    SLP Start Time 1225    SLP Stop Time  9485    SLP Time Calculation (min) 48 min    Activity Tolerance Patient tolerated treatment well             Past Medical History:  Diagnosis Date   Acute metabolic encephalopathy 07/23/2701   Acute metabolic encephalopathy 5/0/0938   Alcohol use 04/23/2020   Alcohol withdrawal seizure with complication, with unspecified complication ([place]) 1/82/9937   CAO (chronic airflow obstruction) ([facility])    Cataract    OD   CVA (cerebral vascular accident) ([place])    Depression    Diabetes mellitus without complication ([place])    Diabetic retinopathy ([place])    NPDR OU   GERD (gastroesophageal reflux disease)    if drinks alcohol   GI bleed 10/05/2020   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   Patient is approximately 5-week status post bunion correction left foot   Hyperlipidemia    Hypertension    Hypertensive retinopathy    OU   Hypoglycemia 10/22/2020   Malignant neoplasm of prostate ([place]) 01/09/2014   Neuropathy    Pancreatitis    Pneumococcal vaccination administered at current visit 11/10/2020   Prostate cancer ([place]) 12/19/2013   Gleason 4+3=7, volume 31.31 cc   Rhabdomyolysis 04/12/2021   Sciatica    Shortness of breath dyspnea    with exertion    Past Surgical History:  Procedure Laterality Date   BIOPSY  04/16/2018   Procedure: BIOPSY;  Surgeon: Juanita Craver, MD;  Location: [facility];  Service: Endoscopy;;   BIOPSY  05/23/2020   Procedure: BIOPSY;  Surgeon: Jackquline Denmark, MD;  Location: [facility];  Service:  Gastroenterology;;  EGD and COLON   BIOPSY  12/09/2020   Procedure: BIOPSY;  Surgeon: Thornton Park, MD;  Location: [facility] END[facility]COPY;  Service: Gastroenterology;;   biopsy on throat     hx of    CATARACT EXTRACTION Bilateral    Dr. Quentin Ore   COLONOSCOPY N/A 05/23/2020   Procedure: COLONOSCOPY;  Surgeon: Jackquline Denmark, MD;  Location: [facility];  Service: Gastroenterology;  Laterality: N/A;   ENTEROSCOPY N/A 10/07/2020   Procedure: ENTEROSCOPY;  Surgeon: [name], MD;  Location: [facility] ENDOSCOPY;  Service: Endoscopy;  Laterality: N/A;   ESOPHAGOGASTRODUODENOSCOPY Left 04/16/2018   Procedure: ESOPHAGOGASTRODUODENOSCOPY (EGD);  Surgeon: Juanita Craver, MD;  Location: [facility];  Service: Endoscopy;  Laterality: Left;   ESOPHAGOGASTRODUODENOSCOPY (EGD) WITH PROPOFOL N/A 05/23/2020   Procedure: ESOPHAGOGASTRODUODENOSCOPY (EGD) WITH PROPOFOL;  Surgeon: Jackquline Denmark, MD;  Location: [facility];  Service: Gastroenterology;  Laterality: N/A;   ESOPHAGOGASTRODUODENOSCOPY (EGD) WITH PROPOFOL N/A 12/09/2020   Procedure: ESOPHAGOGASTRODUODENOSCOPY (EGD) WITH PROPOFOL;  Surgeon: Thornton Park, MD;  Location: [facility];  Service: Gastroenterology;  Laterality: N/A;   EYE SURGERY     FOOT SURGERY     HOT HEMOSTASIS N/A 04/16/2018   Procedure: HOT HEMOSTASIS (ARGON PLASMA COAGULATION/BICAP);  Surgeon: Juanita Craver, MD;  Location: [facility];  Service: Endoscopy;  Laterality: N/A;   HOT HEMOSTASIS N/A 05/23/2020  Procedure: HOT HEMOSTASIS (ARGON PLASMA COAGULATION/BICAP);  Surgeon: Jackquline Denmark, MD;  Location: [facility];  Service: Gastroenterology;  Laterality: N/A;   HOT HEMOSTASIS N/A 12/09/2020   Procedure: HOT HEMOSTASIS (ARGON PLASMA COAGULATION/BICAP);  Surgeon: Thornton Park, MD;  Location: [facility];  Service: Gastroenterology;  Laterality: N/A;   LYMPHADENECTOMY Bilateral 02/27/2014   Procedure: BILATERAL LYMPHADENECTOMY;  Surgeon: Alexis Frock, MD;  Location: [facility];   Service: Urology;  Laterality: Bilateral;   POLYPECTOMY  05/23/2020   Procedure: POLYPECTOMY;  Surgeon: Jackquline Denmark, MD;  Location: [facility];  Service: Gastroenterology;;   PROSTATE BIOPSY  12/2013   Gleason 4+3=7, volume 31.31 cc   ROBOT ASSISTED LAPAROSCOPIC RADICAL PROSTATECTOMY N/A 02/27/2014   Procedure: ROBOTIC ASSISTED LAPAROSCOPIC RADICAL PROSTATECTOMY WITH INDOCYANINE GREEN DYE;  Surgeon: Alexis Frock, MD;  Location: [facility];  Service: Urology;  Laterality: N/A;   Patient Active Problem List   Diagnosis Date Noted   Hepatitis C test positive 12/04/2021   Abscess of chest wall 12/04/2021   History of epistaxis 12/04/2021   Debility 04/15/2021   History of alcohol abuse 04/13/2021   Diabetic hypoglycemia ([place]) 04/12/2021   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/12/2021   Weakness of both legs    Adenomatous duodenal polyp    AVM (arteriovenous malformation) of duodenum, acquired with hemorrhage    Acute on chronic anemia 10/22/2020   Angiodysplasia of intestine    Iron deficiency anemia 05/22/2020   Constipation 05/22/2020   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 ([place])    History of CVA (cerebrovascular accident) 04/23/2020   Hyperlipidemia associated with type 2 diabetes mellitus ([place]) 04/23/2020   CKD (chronic kidney disease) stage 3, GFR 30-59 ml/min ([place]) 04/23/2020   Tobacco use 04/23/2020   Seizure disorder ([place]) 11/08/2018   Hypertension associated with diabetes ([place]) 11/08/2018   Type 2 diabetes mellitus without complication ([place]) 77/41/2878   Prostate cancer ([place]) 02/27/2014   Bunion 01/17/2013   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    ONSET DATE: Jan 2022; referral 01/05/2022   REFERRING DIAG:  I63.9 (ICD-10-CM) - Left basal ganglia embolic stroke ([place])  M76.7 (ICD-10-CM) - Speech and language deficits    THERAPY DIAG:  Aphasia  Rationale for Evaluation and Treatment Rehabilitation  SUBJECTIVE:   SUBJECTIVE  STATEMENT: "I'm still going through the same thing"  Pt accompanied by: self  PERTINENT HISTORY: July 2020 patient presented to hospital for witnessed seizure, possibly related to alcohol withdrawal. January 2022 patient presented to hospital for right-sided weakness and shakiness.  Patient was diagnosed with stroke and completed work-up. June 2022 patient presented to hospital for syncope and visual scintillations.  Abnormal EEG slowing was noted.  Patient was discharged on antiseizure medication. Since that time patient is doing well.  No further seizures or syncope.  No more strokelike symptoms.  PAIN:  Are you having pain? No  FALLS: Has patient fallen in last 6 months?  Yes, Number of falls: 2, Comment: pt unable to relay what happened d/t communication impairment  LIVING ENVIRONMENT: Lives with: lives alone Lives in: House/apartment  PLOF:  Level of assistance: Independent with IADLs Employment: Retired   PATIENT GOALS "work on my therapy"  OBJECTIVE:   COGNITION: Overall cognitive status: No family/caregiver present to determine baseline cognitive functioning Areas of impairment:  Orientation to place and date observed Functional deficits: pt reports successful schedule, medication, and financial management. Difficult to assess functional needs d/t pt attending session independently and presenting with expressive language impairment.   AUDITORY  COMPREHENSION: Overall auditory comprehension: Impaired: moderately complex YES/NO questions: Impaired: moderately complex (70% accuracy) Following directions: Appears intact given extended processing time Conversation: Moderately Complex with occasional repetition requested by pt Interfering components: attention, processing speed, and working memory Effective technique: extra processing time, repetition/stressing words, and stressing words   READING COMPREHENSION: Intact  EXPRESSION: verbal  VERBAL EXPRESSION: Level of  generative/spontaneous verbalization: sentence Automatic speech: counting: intact and month of year: impaired  Repetition: Impaired: sentence, likely 2/2 working memory deficits Naming: Confrontation: 76-100% and Divergent: named 3 fruits/vegetables, 6 animals Pragmatics: Appears intact Interfering components: speech intelligibility 2/2 dysphonia (strained vocal quality) Effective technique: open ended questions, semantic cues, and phonemic cues (intermittent success across cueing strategies) Non-verbal means of communication: N/A Comments: Halting, false starts, anomia in discourse level speech, dysfluencies impacting successful verbal communication   WRITTEN EXPRESSION: Dominant hand: right  Written expression: Appears intact at word level   MOTOR SPEECH: Overall motor speech: impaired Level of impairment: Phrase Respiration: thoracic breathing Phonation: low vocal intensity and strained Resonance: WFL Articulation: Impaired: sentence Intelligibility: Intelligibility reduced Motor planning: Impaired: aware and inconsistent Motor speech errors: groping for words, inconsistent, and dysfluencies Stuttering behaviors: repetition whole word, repetition part word, false starts, mazing, and revision Interfering components:  aphasia Effective technique: slow rate and pacing  ORAL MOTOR EXAMINATION Overall status: WFL  STANDARDIZED ASSESSMENTS: QAB: Moderate   PATIENT REPORTED OUTCOME MEASURES (PROM): Sent home Communication Participation Item Bank for initial HEP, pt to return   TODAY'S TREATMENT:  Education provided on evaluation results and SLP's recommendations. SLP provided feedback to pt on current strengths in regards to compensations being used for verbal apraxia, receptive language and word finding deficits. Pt verbalizes agreement with POC, all questions answered to satisfaction.   PATIENT EDUCATION: Education details: see above Person educated: Patient Education  method: Consulting civil engineer, Demonstration, Verbal cues, and Handouts Education comprehension: verbalized understanding, returned demonstration, verbal cues required, and needs further education   GOALS: Goals reviewed with patient? Yes  SHORT TERM GOALS: Target date: 02/15/2022  Pt will name 10 items in personally relevant category using compensations when needed with occasional min-A over 2 sessions Baseline: Goal status: INITIAL  2.  Pt will read sentences using compensations for verbal apraxia, demonstrating 90% intelligibility in 15/20 trials with occasional min-A over 2 sessions Baseline:  Goal status: INITIAL  3.  Pt will complete sentence level structured language tasks with 80% accuracy with occasional mod-A over 2 sessions Baseline:  Goal status: INITIAL  4.  Pt will report daily compliance with HEP over 1 week period to maximize therapy efficacy Baseline:  Goal status: INITIAL   LONG TERM GOALS: Target date: 04/05/2022  Pt will generate 3+ sentence response to given prompt (open ended question, picture description, etc.), with use of compensations as needed, with occasional min-A over 2 sessions Baseline:  Goal status: INITIAL  2.  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 resulting in 95% intelligibility in 15 minute conversation with occasional min-A Baseline:  Goal status: INITIAL  3.  Pt will complete sentence level structured language tasks with 90% accuracy with rare min-A over 2 sessions Baseline:  Goal status: INITIAL  4.  Pt will report subjective improvement in communication efficacy and confidence with communication partners at home and in community via PROM by d/c Baseline:  Goal status: INITIAL   ASSESSMENT:  CLINICAL IMPRESSION: Patient is a 76 y.o. M who was seen today for cognitive linguistic evaluation. Pt presents with moderate expressive aphasia and apraxia. Pt's verbal  expression is impaired at discourse level, requiring extended time to  answer questions, provide personal information, and express thoughts and ideas. Vague language and disorganization, with occasional instances of anomia/dysnomia, impede overall clarity of message. Mild receptive language deficits evidenced this date with pt benefiting from usual repetition and simplification of message. Pt using self-selected strategies to repeat back to aid in auditory comprehension which is overall successful during today's evaluation. Speech production is characterized by usual self-correction and dysfluencies which further contribute to breakdown in communication efficacy. Pt aware of apraxic errors and is attempting to self-correct which is successful approximately 80% of the time. Voice is noted to have strained quality, most notable in discourse level tasks, and with resolution during simple reading or sustained "ah" speech tasks. SLP recommends skilled ST interventions to improve pt's ability to communicate thoughts across communication partners and situations.   OBJECTIVE IMPAIRMENTS include aphasia and apraxia. These impairments are limiting patient from effectively communicating at home and in community. Factors affecting potential to achieve goals and functional outcome are ability to learn/carryover information, cooperation/participation level, previous level of function, and family/community support. Patient will benefit from skilled SLP services to address above impairments and improve overall function.  REHAB POTENTIAL: Good  PLAN: SLP FREQUENCY: 2x/week  SLP DURATION: 12 weeks  PLANNED INTERVENTIONS: Language facilitation, Cueing hierachy, Internal/external aids, Functional tasks, SLP instruction and feedback, Compensatory strategies, and Patient/family education    JEANCARLOS MARCHENA, CCC-SLP 01/11/2022, 1:11 PM

## 2022-01-20 ENCOUNTER — Ambulatory Visit: Payer: Medicare Other | Attending: Adult Health | Admitting: Speech Pathology

## 2022-01-20 DIAGNOSIS — R4701 Aphasia: Secondary | ICD-10-CM | POA: Diagnosis not present

## 2022-01-20 DIAGNOSIS — R41841 Cognitive communication deficit: Secondary | ICD-10-CM | POA: Insufficient documentation

## 2022-01-20 DIAGNOSIS — R482 Apraxia: Secondary | ICD-10-CM | POA: Insufficient documentation

## 2022-01-20 NOTE — Therapy (Signed)
OUTPATIENT SPEECH LANGUAGE PATHOLOGY TREATMENT NOTE   Patient Name: Gregory Nunez. MRN: [id number] DOB:09-12-1945, 76 y.o., male Today's Date: 01/20/2022  PCP: Farrel Gordon, DO REFERRING PROVIDER: Frann Rider, NP   End of Session - 01/20/22 0917     Visit Number 2    Number of Visits 25    Date for SLP Re-Evaluation 04/05/22    Authorization Type U[employer] Medicare    Progress Note Due on Visit 10    SLP Start Time 0917    SLP Stop Time  1001    SLP Time Calculation (min) 44 min    Activity Tolerance Patient tolerated treatment well              Past Medical History:  Diagnosis Date   Acute metabolic encephalopathy 0/04/6008   Acute metabolic encephalopathy 12/20/2353   Alcohol use 04/23/2020   Alcohol withdrawal seizure with complication, with unspecified complication ([place]) 7/32/2025   CAO (chronic airflow obstruction) (H[facility])    Cataract    OD   CVA (cerebral vascular accident) ([place])    Depression    Diabetes mellitus without complication ([place])    Diabetic retinopathy ([place])    NPDR OU   GERD (gastroesophageal reflux disease)    if drinks alcohol   GI bleed 10/05/2020   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   Patient is approximately 5-week status post bunion correction left foot   Hyperlipidemia    Hypertension    Hypertensive retinopathy    OU   Hypoglycemia 10/22/2020   Malignant neoplasm of prostate ([place]) 01/09/2014   Neuropathy    Pancreatitis    Pneumococcal vaccination administered at current visit 11/10/2020   Prostate cancer ([place]) 12/19/2013   Gleason 4+3=7, volume 31.31 cc   Rhabdomyolysis 04/12/2021   Sciatica    Shortness of breath dyspnea    with exertion    Past Surgical History:  Procedure Laterality Date   BIOPSY  04/16/2018   Procedure: BIOPSY;  Surgeon: Juanita Craver, MD;  [facility]: [facility];  Service: Endoscopy;;   BIOPSY  05/23/2020   Procedure: BIOPSY;  Surgeon: Jackquline Denmark, MD;  [facility]: [facility];  Service:  Gastroenterology;;  EGD and COLON   BIOPSY  12/09/2020   Procedure: BIOPSY;  Surgeon: Thornton Park, MD;  [facility]: [facility]OSCOPY;  Service: Gastroenterology;;   biopsy on throat     hx of    CATARACT EXTRACTION Bilateral    Dr. Quentin Ore   COLONOSCOPY N/A 05/23/2020   Procedure: COLONOSCOPY;  Surgeon: Jackquline Denmark, MD;  [facility];  Service: Gastroenterology;  Laterality: N/A;   ENTEROSCOPY N/A 10/07/2020   Procedure: ENTEROSCOPY;  Surgeon: [name], MD;  Location: [facility] ENDOSCOPY;  Service: Endoscopy;  Laterality: N/A;   ESOPHAGOGASTRODUODENOSCOPY Left 04/16/2018   Procedure: ESOPHAGOGASTRODUODENOSCOPY (EGD);  Surgeon: Juanita Craver, MD;  Location: [facility];  Service: Endoscopy;  Laterality: Left;   ESOPHAGOGASTRODUODENOSCOPY (EGD) WITH PROPOFOL N/A 05/23/2020   Procedure: ESOPHAGOGASTRODUODENOSCOPY (EGD) WITH PROPOFOL;  Surgeon: Jackquline Denmark, MD;  [facility];  Service: Gastroenterology;  Laterality: N/A;   ESOPHAGOGASTRODUODENOSCOPY (EGD) WITH PROPOFOL N/A 12/09/2020   Procedure: ESOPHAGOGASTRODUODENOSCOPY (EGD) WITH PROPOFOL;  Surgeon: Thornton Park, MD;  Location: [place];  Service: Gastroenterology;  Laterality: N/A;   EYE SURGERY     FOOT SURGERY     HOT HEMOSTASIS N/A 04/16/2018   Procedure: HOT HEMOSTASIS (ARGON PLASMA COAGULATION/BICAP);  Surgeon: Juanita Craver, MD;  Location: [facility];  Service: Endoscopy;  Laterality: N/A;   HOT HEMOSTASIS N/A 05/23/2020  Procedure: HOT HEMOSTASIS (ARGON PLASMA COAGULATION/BICAP);  Surgeon: Jackquline Denmark, MD;  Location: [facility];  Service: Gastroenterology;  Laterality: N/A;   HOT HEMOSTASIS N/A 12/09/2020   Procedure: HOT HEMOSTASIS (ARGON PLASMA COAGULATION/BICAP);  Surgeon: Thornton Park, MD;  Location: [place];  Service: Gastroenterology;  Laterality: N/A;   LYMPHADENECTOMY Bilateral 02/27/2014   Procedure: BILATERAL LYMPHADENECTOMY;  Surgeon: Alexis Frock, MD;  [facility];   Service: Urology;  Laterality: Bilateral;   POLYPECTOMY  05/23/2020   Procedure: POLYPECTOMY;  Surgeon: Jackquline Denmark, MD;  [facility];  Service: Gastroenterology;;   PROSTATE BIOPSY  12/2013   Gleason 4+3=7, volume 31.31 cc   ROBOT ASSISTED LAPAROSCOPIC RADICAL PROSTATECTOMY N/A 02/27/2014   Procedure: ROBOTIC ASSISTED LAPAROSCOPIC RADICAL PROSTATECTOMY WITH INDOCYANINE GREEN DYE;  Surgeon: Alexis Frock, MD;  [facility];  Service: Urology;  Laterality: N/A;   Patient Active Problem List   Diagnosis Date Noted   Hepatitis C test positive 12/04/2021   Abscess of chest wall 12/04/2021   History of epistaxis 12/04/2021   Debility 04/15/2021   History of alcohol abuse 04/13/2021   Diabetic hypoglycemia ([place]) 04/12/2021   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/12/2021   Weakness of both legs    Adenomatous duodenal polyp    AVM (arteriovenous malformation) of duodenum, acquired with hemorrhage    Acute on chronic anemia 10/22/2020   Angiodysplasia of intestine    Iron deficiency anemia 05/22/2020   Constipation 05/22/2020   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 ([place])    History of CVA (cerebrovascular accident) 04/23/2020   Hyperlipidemia associated with type 2 diabetes mellitus ([place]) 04/23/2020   CKD (chronic kidney disease) stage 3, GFR 30-59 ml/min ([place]) 04/23/2020   Tobacco use 04/23/2020   Seizure disorder ([place]) 11/08/2018   Hypertension associated with diabetes ([place]) 11/08/2018   Type 2 diabetes mellitus without complication ([place]) 95/63/8756   Prostate cancer ([place]) 02/27/2014   Bunion 01/17/2013   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    ONSET DATE: Jan 2022; referral 01/05/2022   REFERRING DIAG:  I63.9 (ICD-10-CM) - Left basal ganglia embolic stroke ([place])  E33.2 (ICD-10-CM) - Speech and language deficits    THERAPY DIAG:  Aphasia  Verbal apraxia  Cognitive communication deficit  Rationale for Evaluation and Treatment  Rehabilitation  SUBJECTIVE:   SUBJECTIVE STATEMENT: "I haven't been up to much of nothing"    PAIN:  Are you having pain? No   OBJECTIVE:   PATIENT REPORTED OUTCOME MEASURES (PROM): Sent home Communication Participation Item Bank for initial HEP, pt to return   TODAY'S TREATMENT:  01-20-22: Target personally relevant naming, with goal of naming football teams. Pt able to generate x2 with mod-A. Support pt naming with use of picture cards. Given visual A, pt names x4. Additional 4 named with max-A, phonemic and questioning cues. SLP provided pt with handout of targeted teams to practice for HEP. Led pt through [employer] with target word "play". Pt generates x1 who + what with min-A. Additional x2 whos generated with SLP supplying the target what. Following, pt able to answer x4 [facility]- questions (2 for 2 sets), resulting in expanded utterance with increased content words, averaging 5 content words across 4 trials.   PATIENT EDUCATION: Education details: see above Person educated: Patient Education method: Consulting civil engineer, Demonstration, Verbal cues, and Handouts Education comprehension: verbalized understanding, returned demonstration, verbal cues required, and needs further education   GOALS: Goals reviewed with patient? Yes  SHORT TERM GOALS: Target date: 02/15/2022  Pt will name 10 items in personally relevant category using compensations when needed with occasional min-A over 2 sessions Baseline: Goal status: INITIAL  2.  Pt will read sentences using compensations for verbal apraxia, demonstrating 90% intelligibility in 15/20 trials with occasional min-A over 2 sessions Baseline:  Goal status: INITIAL  3.  Pt will complete sentence level structured language tasks with 80% accuracy with occasional mod-A over 2 sessions Baseline:  Goal status: INITIAL  4.  Pt will report daily compliance with HEP over 1 week period to maximize therapy  efficacy Baseline:  Goal status: INITIAL   LONG TERM GOALS: Target date: 04/05/2022  Pt will generate 3+ sentence response to given prompt (open ended question, picture description, etc.), with use of compensations as needed, with occasional min-A over 2 sessions Baseline:  Goal status: INITIAL  2.  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 resulting in 95% intelligibility in 15 minute conversation with occasional min-A Baseline:  Goal status: INITIAL  3.  Pt will complete sentence level structured language tasks with 90% accuracy with rare min-A over 2 sessions Baseline:  Goal status: INITIAL  4.  Pt will report subjective improvement in communication efficacy and confidence with communication partners at home and in community via PROM by d/c Baseline:  Goal status: INITIAL   ASSESSMENT:  CLINICAL IMPRESSION: Patient is a 76 y.o. M who was seen today for cognitive linguistic evaluation. Pt presents with moderate expressive aphasia and apraxia. Pt's verbal expression is impaired at discourse level, requiring extended time to answer questions, provide personal information, and express thoughts and ideas. Vague language and disorganization, with occasional instances of anomia/dysnomia, impede overall clarity of message. Mild receptive language deficits evidenced this date with pt benefiting from usual repetition and simplification of message. Pt using self-selected strategies to repeat back to aid in auditory comprehension which is overall successful during today's evaluation. Speech production is characterized by usual self-correction and dysfluencies which further contribute to breakdown in communication efficacy. Pt aware of apraxic errors and is attempting to self-correct which is successful approximately 80% of the time. Voice is noted to have strained quality, most notable in discourse level tasks, and with resolution during simple reading or sustained "ah" speech tasks. SLP  recommends skilled ST interventions to improve pt's ability to communicate thoughts across communication partners and situations.   OBJECTIVE IMPAIRMENTS include aphasia and apraxia. These impairments are limiting patient from effectively communicating at home and in community. Factors affecting potential to achieve goals and functional outcome are ability to learn/carryover information, cooperation/participation level, previous level of function, and family/community support. Patient will benefit from skilled SLP services to address above impairments and improve overall function.  REHAB POTENTIAL: Good  PLAN: SLP FREQUENCY: 2x/week  SLP DURATION: 12 weeks  PLANNED INTERVENTIONS: Language facilitation, Cueing hierachy, Internal/external aids, Functional tasks, SLP instruction and feedback, Compensatory strategies, and Patient/family education    JENTZEN MINASYAN, CCC-SLP 01/20/2022, 9:21 AM

## 2022-01-22 ENCOUNTER — Ambulatory Visit: Payer: Medicare Other | Admitting: Speech Pathology

## 2022-01-22 DIAGNOSIS — R41841 Cognitive communication deficit: Secondary | ICD-10-CM

## 2022-01-22 DIAGNOSIS — R4701 Aphasia: Secondary | ICD-10-CM

## 2022-01-22 DIAGNOSIS — R482 Apraxia: Secondary | ICD-10-CM

## 2022-01-22 NOTE — Patient Instructions (Signed)
Pam watches TV.  Where? Why?  John and I watch football. When? Where?  Head coach watches the players. Where? Why?   SLP Janett Billow) reads patient charts. Why?  When?   I like to read my bible. When? Where?  Football fans read the time clock and the game score. Why? When?

## 2022-01-22 NOTE — Therapy (Signed)
OUTPATIENT SPEECH LANGUAGE PATHOLOGY TREATMENT NOTE   Patient Name: Gregory Nunez. MRN: [id number] DOB:Nov 09, 1945, 76 y.o., male Today's Date: 01/22/2022  PCP: Farrel Gordon, DO REFERRING PROVIDER: Frann Rider, NP   End of Session - 01/22/22 0842     Visit Number 3    Number of Visits 25    Date for SLP Re-Evaluation 04/05/22    Authorization Type UHC Medicare    Progress Note Due on Visit 10    SLP Start Time [id number]    SLP Stop Time  0928    SLP Time Calculation (min) 46 min    Activity Tolerance Patient tolerated treatment well              Past Medical History:  Diagnosis Date   Acute metabolic encephalopathy 1/0/2585   Acute metabolic encephalopathy 05/26/7822   Alcohol use 04/23/2020   Alcohol withdrawal seizure with complication, with unspecified complication ([place]) 2/35/3614   CAO (chronic airflow obstruction) (HCC)    Cataract    OD   CVA (cerebral vascular accident) ([place])    Depression    Diabetes mellitus without complication ([place])    Diabetic retinopathy ([place])    NPDR OU   GERD (gastroesophageal reflux disease)    if drinks alcohol   GI bleed 10/05/2020   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   Patient is approximately 5-week status post bunion correction left foot   Hyperlipidemia    Hypertension    Hypertensive retinopathy    OU   Hypoglycemia 10/22/2020   Malignant neoplasm of prostate ([place]) 01/09/2014   Neuropathy    Pancreatitis    Pneumococcal vaccination administered at current visit 11/10/2020   Prostate cancer ([place]) 12/19/2013   Gleason 4+3=7, volume 31.31 cc   Rhabdomyolysis 04/12/2021   Sciatica    Shortness of breath dyspnea    with exertion    Past Surgical History:  Procedure Laterality Date   BIOPSY  04/16/2018   Procedure: BIOPSY;  Surgeon: Juanita Craver, MD;  [facility];  Service: Endoscopy;;   BIOPSY  05/23/2020   Procedure: BIOPSY;  Surgeon: Jackquline Denmark, MD;  [facility];  Service:  Gastroenterology;;  EGD and COLON   BIOPSY  12/09/2020   Procedure: BIOPSY;  Surgeon: Thornton Park, MD;  Location: [facility] ENDOSCOPY;  Service: Gastroenterology;;   biopsy on throat     hx of    CATARACT EXTRACTION Bilateral    Dr. Quentin Ore   COLONOSCOPY N/A 05/23/2020   Procedure: COLONOSCOPY;  Surgeon: Jackquline Denmark, MD;  [facility];  Service: Gastroenterology;  Laterality: N/A;   ENTEROSCOPY N/A 10/07/2020   Procedure: ENTEROSCOPY;  Surgeon: [name], MD;  Location: [facility] ENDOSCOPY;  Service: Endoscopy;  Laterality: N/A;   ESOPHAGOGASTRODUODENOSCOPY Left 04/16/2018   Procedure: ESOPHAGOGASTRODUODENOSCOPY (EGD);  Surgeon: Juanita Craver, MD;  Location: [facility];  Service: Endoscopy;  Laterality: Left;   ESOPHAGOGASTRODUODENOSCOPY (EGD) WITH PROPOFOL N/A 05/23/2020   Procedure: ESOPHAGOGASTRODUODENOSCOPY (EGD) WITH PROPOFOL;  Surgeon: Jackquline Denmark, MD;  [facility];  Service: Gastroenterology;  Laterality: N/A;   ESOPHAGOGASTRODUODENOSCOPY (EGD) WITH PROPOFOL N/A 12/09/2020   Procedure: ESOPHAGOGASTRODUODENOSCOPY (EGD) WITH PROPOFOL;  Surgeon: Thornton Park, MD;  Location: [place];  Service: Gastroenterology;  Laterality: N/A;   EYE SURGERY     FOOT SURGERY     HOT HEMOSTASIS N/A 04/16/2018   Procedure: HOT HEMOSTASIS (ARGON PLASMA COAGULATION/BICAP);  Surgeon: Juanita Craver, MD;  Location: [facility];  Service: Endoscopy;  Laterality: N/A;   HOT HEMOSTASIS N/A 05/23/2020  Procedure: HOT HEMOSTASIS (ARGON PLASMA COAGULATION/BICAP);  Surgeon: Jackquline Denmark, MD;  Location: [facility];  Service: Gastroenterology;  Laterality: N/A;   HOT HEMOSTASIS N/A 12/09/2020   Procedure: HOT HEMOSTASIS (ARGON PLASMA COAGULATION/BICAP);  Surgeon: Thornton Park, MD;  Location: [place];  Service: Gastroenterology;  Laterality: N/A;   LYMPHADENECTOMY Bilateral 02/27/2014   Procedure: BILATERAL LYMPHADENECTOMY;  Surgeon: Alexis Frock, MD;  [facility];   Service: Urology;  Laterality: Bilateral;   POLYPECTOMY  05/23/2020   Procedure: POLYPECTOMY;  Surgeon: Jackquline Denmark, MD;  [facility];  Service: Gastroenterology;;   PROSTATE BIOPSY  12/2013   Gleason 4+3=7, volume 31.31 cc   ROBOT ASSISTED LAPAROSCOPIC RADICAL PROSTATECTOMY N/A 02/27/2014   Procedure: ROBOTIC ASSISTED LAPAROSCOPIC RADICAL PROSTATECTOMY WITH INDOCYANINE GREEN DYE;  Surgeon: Alexis Frock, MD;  [facility];  Service: Urology;  Laterality: N/A;   Patient Active Problem List   Diagnosis Date Noted   Hepatitis C test positive 12/04/2021   Abscess of chest wall 12/04/2021   History of epistaxis 12/04/2021   Debility 04/15/2021   History of alcohol abuse 04/13/2021   Diabetic hypoglycemia ([place]) 04/12/2021   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/12/2021   Weakness of both legs    Adenomatous duodenal polyp    AVM (arteriovenous malformation) of duodenum, acquired with hemorrhage    Acute on chronic anemia 10/22/2020   Angiodysplasia of intestine    Iron deficiency anemia 05/22/2020   Constipation 05/22/2020   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 ([place])    History of CVA (cerebrovascular accident) 04/23/2020   Hyperlipidemia associated with type 2 diabetes mellitus ([place]) 04/23/2020   CKD (chronic kidney disease) stage 3, GFR 30-59 ml/min ([place]) 04/23/2020   Tobacco use 04/23/2020   Seizure disorder ([place]) 11/08/2018   Hypertension associated with diabetes ([place]) 11/08/2018   Type 2 diabetes mellitus without complication ([place]) 56/38/7564   Prostate cancer ([place]) 02/27/2014   Bunion 01/17/2013   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    ONSET DATE: Jan 2022; referral 01/05/2022   REFERRING DIAG:  I63.9 (ICD-10-CM) - Left basal ganglia embolic stroke ([place])  P32.9 (ICD-10-CM) - Speech and language deficits    THERAPY DIAG:  Verbal apraxia  Aphasia  Cognitive communication deficit  Rationale for Evaluation and Treatment  Rehabilitation  SUBJECTIVE:   SUBJECTIVE STATEMENT: Pt reports to working on HEP   PAIN:  Are you having pain? No   OBJECTIVE:   PATIENT REPORTED OUTCOME MEASURES (PROM): Communication Participation Item Bank: 23 "A little" for talking to known and unknown people, communicating quickly, communicating in community, within small group, having long or detailed conversation.    TODAY'S TREATMENT:  01-22-22: Pt able to name 8/10 target stimuli from previous session with supervision, remaining 2 with mod phonemic cueing. Use of verb pictures, with pt able to generate accurate verb in 12/15 trials. Targeted word finding, verbal apraxia, sentence generation with Pharmacist, hospital ([name]). For the verbs watch and read, the patient generated 3 subject and objects for each verb, a total of 6 subjects and objects with usual mod verbal, questioning, written cues required. Pt generated x4 complex sentences, 2 for each verb, by answering "wh" questions with max cues. Assist pt completion of PROM, see above.    01-20-22: Target personally relevant naming, with goal of naming football teams. Pt able to generate x2 with mod-A. Support pt naming with use of picture cards. Given visual A, pt names x4. Additional 4 named with max-A, phonemic and questioning  cues. SLP provided pt with handout of targeted teams to practice for HEP. Led pt through [employer] with target word "play". Pt generates x1 who + what with min-A. Additional x2 whos generated with SLP supplying the target what. Following, pt able to answer x4 [facility]- questions (2 for 2 sets), resulting in expanded utterance with increased content words, averaging 5 content words across 4 trials.   PATIENT EDUCATION: Education details: see above Person educated: Patient Education method: Consulting civil engineer, Demonstration, Verbal cues, and Handouts Education comprehension: verbalized understanding, returned demonstration,  verbal cues required, and needs further education   GOALS: Goals reviewed with patient? Yes  SHORT TERM GOALS: Target date: 02/15/2022  Pt will name 10 items in personally relevant category using compensations when needed with occasional min-A over 2 sessions Baseline: Goal status: INITIAL  2.  Pt will read sentences using compensations for verbal apraxia, demonstrating 90% intelligibility in 15/20 trials with occasional min-A over 2 sessions Baseline:  Goal status: INITIAL  3.  Pt will complete sentence level structured language tasks with 80% accuracy with occasional mod-A over 2 sessions Baseline:  Goal status: INITIAL  4.  Pt will report daily compliance with HEP over 1 week period to maximize therapy efficacy Baseline:  Goal status: INITIAL   LONG TERM GOALS: Target date: 04/05/2022  Pt will generate 3+ sentence response to given prompt (open ended question, picture description, etc.), with use of compensations as needed, with occasional min-A over 2 sessions Baseline:  Goal status: INITIAL  2.  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 resulting in 95% intelligibility in 15 minute conversation with occasional min-A Baseline:  Goal status: INITIAL  3.  Pt will complete sentence level structured language tasks with 90% accuracy with rare min-A over 2 sessions Baseline:  Goal status: INITIAL  4.  Pt will report subjective improvement in communication efficacy and confidence with communication partners at home and in community via PROM by d/c Baseline:  Goal status: INITIAL   ASSESSMENT:  CLINICAL IMPRESSION: Patient is a 76 y.o. M who was seen today for cognitive linguistic evaluation. Pt presents with moderate expressive aphasia and apraxia. Pt's verbal expression is impaired at discourse level, requiring extended time to answer questions, provide personal information, and express thoughts and ideas. Vague language and disorganization, with occasional  instances of anomia/dysnomia, impede overall clarity of message. Mild receptive language deficits evidenced this date with pt benefiting from usual repetition and simplification of message. Pt using self-selected strategies to repeat back to aid in auditory comprehension which is overall successful during today's evaluation. Speech production is characterized by usual self-correction and dysfluencies which further contribute to breakdown in communication efficacy. Pt aware of apraxic errors and is attempting to self-correct which is successful approximately 80% of the time. Voice is noted to have strained quality, most notable in discourse level tasks, and with resolution during simple reading or sustained "ah" speech tasks. SLP recommends skilled ST interventions to improve pt's ability to communicate thoughts across communication partners and situations.   OBJECTIVE IMPAIRMENTS include aphasia and apraxia. These impairments are limiting patient from effectively communicating at home and in community. Factors affecting potential to achieve goals and functional outcome are ability to learn/carryover information, cooperation/participation level, previous level of function, and family/community support. Patient will benefit from skilled SLP services to address above impairments and improve overall function.  REHAB POTENTIAL: Good  PLAN: SLP FREQUENCY: 2x/week  SLP DURATION: 12 weeks  PLANNED INTERVENTIONS: Language facilitation, Cueing hierachy, Internal/external aids, Functional  tasks, SLP instruction and feedback, Compensatory strategies, and Patient/family education    CRISTO AUSBURN, CCC-SLP 01/22/2022, 8:43 AM

## 2022-01-25 ENCOUNTER — Ambulatory Visit: Payer: Medicare Other | Admitting: Speech Pathology

## 2022-01-25 ENCOUNTER — Encounter: Payer: Self-pay | Admitting: Speech Pathology

## 2022-01-25 DIAGNOSIS — R41841 Cognitive communication deficit: Secondary | ICD-10-CM

## 2022-01-25 DIAGNOSIS — R4701 Aphasia: Secondary | ICD-10-CM | POA: Diagnosis not present

## 2022-01-25 DIAGNOSIS — R482 Apraxia: Secondary | ICD-10-CM

## 2022-01-25 NOTE — Patient Instructions (Signed)
   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 Because he is an Industrial/product designer drives the football Where?  Why?    The [employer]ouch drives the boat Where?  Why?   Referee measures the yards Where? On the field  Why? To see if they get a first down  The nurse measures me  Where?  Why?  I measure ingredients  Where?  Why?

## 2022-01-25 NOTE — Therapy (Signed)
OUTPATIENT SPEECH LANGUAGE PATHOLOGY TREATMENT NOTE   Patient Name: Gregory Nunez. MRN: [id number] DOB:[birth year], 76 y.o., male Today's Date: 01/25/2022  PCP: Farrel Gordon, DO REFERRING PROVIDER: Frann Rider, NP   End of Session - 01/25/22 1022     Visit Number 4    Number of Visits 25    Date for SLP Re-Evaluation 04/05/22    Authorization Type [employer]    Progress Note Due on Visit 10    SLP Start Time 1015    SLP Stop Time  1100    SLP Time Calculation (min) 45 min    Activity Tolerance Patient tolerated treatment well              Past Medical History:  Diagnosis Date   Acute metabolic encephalopathy 06/20/1960   Acute metabolic encephalopathy 05/21/9796   Alcohol use 04/23/2020   Alcohol withdrawal seizure with complication, with unspecified complication ([place]) 01/08/1940   CAO (chronic airflow obstruction) ([facility])    Cataract    OD   CVA (cerebral vascular accident) ([place])    Depression    Diabetes mellitus without complication ([place])    Diabetic retinopathy ([place])    NPDR OU   GERD (gastroesophageal reflux disease)    if drinks alcohol   GI bleed 10/05/2020   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   Patient is approximately 5-week status post bunion correction left foot   Hyperlipidemia    Hypertension    Hypertensive retinopathy    OU   Hypoglycemia 10/22/2020   Malignant neoplasm of prostate ([place]) 01/09/2014   Neuropathy    Pancreatitis    Pneumococcal vaccination administered at current visit 11/10/2020   Prostate cancer ([place]) 12/19/2013   Gleason 4+3=7, volume 31.31 cc   Rhabdomyolysis 04/12/2021   Sciatica    Shortness of breath dyspnea    with exertion    Past Surgical History:  Procedure Laterality Date   BIOPSY  04/16/2018   Procedure: BIOPSY;  Surgeon: Juanita Craver, MD;  Location: [facility];  Service: Endoscopy;;   BIOPSY  05/23/2020   Procedure: BIOPSY;  Surgeon: Jackquline Denmark, MD;  Location: [facility];  Service:  Gastroenterology;;  EGD and COLON   BIOPSY  12/09/2020   Procedure: BIOPSY;  Surgeon: Thornton Park, MD;  Location: [facility];  Service: Gastroenterology;;   biopsy on throat     hx of    CATARACT EXTRACTION Bilateral    Dr. Quentin Ore   COLONOSCOPY N/A 05/23/2020   Procedure: COLONOSCOPY;  Surgeon: Jackquline Denmark, MD;  Location: [facility];  Service: Gastroenterology;  Laterality: N/A;   ENTEROSCOPY N/A 10/07/2020   Procedure: ENTEROSCOPY;  Surgeon: [name], MD;  Location: [facility]COPY;  Service: Endoscopy;  Laterality: N/A;   ESOPHAGOGASTRODUODENOSCOPY Left 04/16/2018   Procedure: ESOPHAGOGASTRODUODENOSCOPY (EGD);  Surgeon: Juanita Craver, MD;  Location: [facility];  Service: Endoscopy;  Laterality: Left;   ESOPHAGOGASTRODUODENOSCOPY (EGD) WITH PROPOFOL N/A 05/23/2020   Procedure: ESOPHAGOGASTRODUODENOSCOPY (EGD) WITH PROPOFOL;  Surgeon: Jackquline Denmark, MD;  Location: [facility];  Service: Gastroenterology;  Laterality: N/A;   ESOPHAGOGASTRODUODENOSCOPY (EGD) WITH PROPOFOL N/A 12/09/2020   Procedure: ESOPHAGOGASTRODUODENOSCOPY (EGD) WITH PROPOFOL;  Surgeon: Thornton Park, MD;  Location: [place];  Service: Gastroenterology;  Laterality: N/A;   EYE SURGERY     FOOT SURGERY     HOT HEMOSTASIS N/A 04/16/2018   Procedure: HOT HEMOSTASIS (ARGON PLASMA COAGULATION/BICAP);  Surgeon: Juanita Craver, MD;  Location: [facility];  Service: Endoscopy;  Laterality: N/A;   HOT HEMOSTASIS N/A 05/23/2020  Procedure: HOT HEMOSTASIS (ARGON PLASMA COAGULATION/BICAP);  Surgeon: Jackquline Denmark, MD;  Location: [facility];  Service: Gastroenterology;  Laterality: N/A;   HOT HEMOSTASIS N/A 12/09/2020   Procedure: HOT HEMOSTASIS (ARGON PLASMA COAGULATION/BICAP);  Surgeon: Thornton Park, MD;  Location: [place];  Service: Gastroenterology;  Laterality: N/A;   LYMPHADENECTOMY Bilateral 02/27/2014   Procedure: BILATERAL LYMPHADENECTOMY;  Surgeon: Alexis Frock, MD;  Location: [facility];   Service: Urology;  Laterality: Bilateral;   POLYPECTOMY  05/23/2020   Procedure: POLYPECTOMY;  Surgeon: Jackquline Denmark, MD;  Location: [facility];  Service: Gastroenterology;;   PROSTATE BIOPSY  12/2013   Gleason 4+3=7, volume 31.31 cc   ROBOT ASSISTED LAPAROSCOPIC RADICAL PROSTATECTOMY N/A 02/27/2014   Procedure: ROBOTIC ASSISTED LAPAROSCOPIC RADICAL PROSTATECTOMY WITH INDOCYANINE GREEN DYE;  Surgeon: Alexis Frock, MD;  Location: [facility];  Service: Urology;  Laterality: N/A;   Patient Active Problem List   Diagnosis Date Noted   Hepatitis C test positive 12/04/2021   Abscess of chest wall 12/04/2021   History of epistaxis 12/04/2021   Debility 04/15/2021   History of alcohol abuse 04/13/2021   Diabetic hypoglycemia ([place]) 04/12/2021   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/12/2021   Weakness of both legs    Adenomatous duodenal polyp    AVM (arteriovenous malformation) of duodenum, acquired with hemorrhage    Acute on chronic anemia 10/22/2020   Angiodysplasia of intestine    Iron deficiency anemia 05/22/2020   Constipation 05/22/2020   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 ([place])    History of CVA (cerebrovascular accident) 04/23/2020   Hyperlipidemia associated with type 2 diabetes mellitus ([place]) 04/23/2020   CKD (chronic kidney disease) stage 3, GFR 30-59 ml/min ([place]) 04/23/2020   Tobacco use 04/23/2020   Seizure disorder ([place]) 11/08/2018   Hypertension associated with diabetes ([place]) 11/08/2018   Type 2 diabetes mellitus without complication ([place]) 02/54/2706   Prostate cancer ([place]) 02/27/2014   Bunion 01/17/2013   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    ONSET DATE: Jan 2022; referral 01/05/2022   REFERRING DIAG:  I63.9 (ICD-10-CM) - Left basal ganglia embolic stroke ([place])  C37.6 (ICD-10-CM) - Speech and language deficits    THERAPY DIAG:  Aphasia  Verbal apraxia  Cognitive communication deficit  Rationale for Evaluation and Treatment  Rehabilitation  SUBJECTIVE:   SUBJECTIVE STATEMENT: Pt reports to working on HEP   PAIN:  Are you having pain? No   OBJECTIVE:   PATIENT REPORTED OUTCOME MEASURES (PROM): Communication Participation Item Bank: 23 "A little" for talking to known and unknown people, communicating quickly, communicating in community, within small group, having long or detailed conversation.    TODAY'S TREATMENT:   01-25-22: [name] named 7/10 [employer] teams to picture of their logo with extended time - he ID apraxic/aphasic errors and self corrected with occasional min phonemic cues. He named remaining 3 with mod phonemic cues.   To target word finding, sentence generation and verbal apraxia,  Pharmacist, hospital ([name]) was utilized. The pt generated 3 subjects and objects for 2 verbs (drive, measure), for a total of 6 subject objects. Pt required usual  min to mod semantic and questioning cues. Pt generated 6 complex sentences by answering "wh" questions. Pt required occasional min semantic cue and extended time cues to generate complex sentences. Provided these sentences and 5-9 word sentences to add oral reading to HEP for verbal apraxia.    01-22-22: Pt able to name 8/10 target stimuli from previous session with supervision, remaining 2 with  mod phonemic cueing. Use of verb pictures, with pt able to generate accurate verb in 12/15 trials. Targeted word finding, verbal apraxia, sentence generation with Pharmacist, hospital ([name]). For the verbs watch and read, the patient generated 3 subject and objects for each verb, a total of 6 subjects and objects with usual mod verbal, questioning, written cues required. Pt generated x4 complex sentences, 2 for each verb, by answering "wh" questions with max cues. Assist pt completion of PROM, see above.    01-20-22: Target personally relevant naming, with goal of naming football teams. Pt able to generate x2 with mod-A. Support pt naming  with use of picture cards. Given visual A, pt names x4. Additional 4 named with max-A, phonemic and questioning cues. SLP provided pt with handout of targeted teams to practice for HEP. Led pt through [employer] with target word "play". Pt generates x1 who + what with min-A. Additional x2 whos generated with SLP supplying the target what. Following, pt able to answer x4 [facility]- questions (2 for 2 sets), resulting in expanded utterance with increased content words, averaging 5 content words across 4 trials.   PATIENT EDUCATION: Education details: see above Person educated: Patient Education method: Consulting civil engineer, Demonstration, Verbal cues, and Handouts Education comprehension: verbalized understanding, returned demonstration, verbal cues required, and needs further education   GOALS: Goals reviewed with patient? Yes  SHORT TERM GOALS: Target date: 02/15/2022  Pt will name 10 items in personally relevant category using compensations when needed with occasional min-A over 2 sessions Baseline: Goal status: INITIAL  2.  Pt will read sentences using compensations for verbal apraxia, demonstrating 90% intelligibility in 15/20 trials with occasional min-A over 2 sessions Baseline:  Goal status: INITIAL  3.  Pt will complete sentence level structured language tasks with 80% accuracy with occasional mod-A over 2 sessions Baseline:  Goal status: INITIAL  4.  Pt will report daily compliance with HEP over 1 week period to maximize therapy efficacy Baseline:  Goal status: INITIAL   LONG TERM GOALS: Target date: 04/05/2022  Pt will generate 3+ sentence response to given prompt (open ended question, picture description, etc.), with use of compensations as needed, with occasional min-A over 2 sessions Baseline:  Goal status: INITIAL  2.  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 resulting in 95% intelligibility in 15 minute conversation with occasional  min-A Baseline:  Goal status: INITIAL  3.  Pt will complete sentence level structured language tasks with 90% accuracy with rare min-A over 2 sessions Baseline:  Goal status: INITIAL  4.  Pt will report subjective improvement in communication efficacy and confidence with communication partners at home and in community via PROM by d/c Baseline:  Goal status: INITIAL   ASSESSMENT:  CLINICAL IMPRESSION: Patient is a 75 y.o. M who was seen today for cognitive linguistic evaluation. Pt presents with moderate expressive aphasia and apraxia. Pt's verbal expression is impaired at discourse level, requiring extended time to answer questions, provide personal information, and express thoughts and ideas. Vague language and disorganization, with occasional instances of anomia/dysnomia, impede overall clarity of message. Mild receptive language deficits evidenced this date with pt benefiting from usual repetition and simplification of message. Pt using self-selected strategies to repeat back to aid in auditory comprehension which is overall successful during today's evaluation. Speech production is characterized by usual self-correction and dysfluencies which further contribute to breakdown in communication efficacy. Pt aware of apraxic errors and is attempting to self-correct which is successful approximately 80%  of the time. Voice is noted to have strained quality, most notable in discourse level tasks, and with resolution during simple reading or sustained "ah" speech tasks. SLP recommends skilled ST interventions to improve pt's ability to communicate thoughts across communication partners and situations.   OBJECTIVE IMPAIRMENTS include aphasia and apraxia. These impairments are limiting patient from effectively communicating at home and in community. Factors affecting potential to achieve goals and functional outcome are ability to learn/carryover information, cooperation/participation level, previous  level of function, and family/community support. Patient will benefit from skilled SLP services to address above impairments and improve overall function.  REHAB POTENTIAL: Good  PLAN: SLP FREQUENCY: 2x/week  SLP DURATION: 12 weeks  PLANNED INTERVENTIONS: Language facilitation, Cueing hierachy, Internal/external aids, Functional tasks, SLP instruction and feedback, Compensatory strategies, and Patient/family education    Alice Rieger Annye Rusk, CCC-SLP 01/25/2022, 12:17 PM

## 2022-01-27 ENCOUNTER — Ambulatory Visit: Payer: Medicare Other | Admitting: Speech Pathology

## 2022-01-27 DIAGNOSIS — R4701 Aphasia: Secondary | ICD-10-CM | POA: Diagnosis not present

## 2022-01-27 DIAGNOSIS — R41841 Cognitive communication deficit: Secondary | ICD-10-CM

## 2022-01-27 DIAGNOSIS — R482 Apraxia: Secondary | ICD-10-CM

## 2022-01-27 NOTE — Therapy (Signed)
OUTPATIENT SPEECH LANGUAGE PATHOLOGY TREATMENT NOTE   Patient Name: Gregory Nunez. MRN: [id number] DOB:1946-02-18, 76 y.o., male Today's Date: 01/27/2022  PCP: Farrel Gordon, DO REFERRING PROVIDER: Frann Rider, NP   End of Session - 01/27/22 1102     Visit Number 5    Number of Visits 25    Date for SLP Re-Evaluation 04/05/22    Authorization Type [employer]    Progress Note Due on Visit 10    SLP Start Time 1102    SLP Stop Time  9892    SLP Time Calculation (min) 43 min    Activity Tolerance Patient tolerated treatment well              Past Medical History:  Diagnosis Date   Acute metabolic encephalopathy 04/20/9415   Acute metabolic encephalopathy 4/0/8144   Alcohol use 04/23/2020   Alcohol withdrawal seizure with complication, with unspecified complication ([place]) 12/04/5629   CAO (chronic airflow obstruction) ([facility])    Cataract    OD   CVA (cerebral vascular accident) ([place])    Depression    Diabetes mellitus without complication ([place])    Diabetic retinopathy ([place])    NPDR OU   GERD (gastroesophageal reflux disease)    if drinks alcohol   GI bleed 10/05/2020   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   Patient is approximately 5-week status post bunion correction left foot   Hyperlipidemia    Hypertension    Hypertensive retinopathy    OU   Hypoglycemia 10/22/2020   Malignant neoplasm of prostate ([place]) 01/09/2014   Neuropathy    Pancreatitis    Pneumococcal vaccination administered at current visit 11/10/2020   Prostate cancer ([place]) 12/19/2013   Gleason 4+3=7, volume 31.31 cc   Rhabdomyolysis 04/12/2021   Sciatica    Shortness of breath dyspnea    with exertion    Past Surgical History:  Procedure Laterality Date   BIOPSY  04/16/2018   Procedure: BIOPSY;  Surgeon: Juanita Craver, MD;  Location: [facility];  Service: Endoscopy;;   BIOPSY  05/23/2020   Procedure: BIOPSY;  Surgeon: Jackquline Denmark, MD;  Location: [facility];  Service:  Gastroenterology;;  EGD and COLON   BIOPSY  12/09/2020   Procedure: BIOPSY;  Surgeon: Thornton Park, MD;  Location: [facility] END[facility]COPY;  Service: Gastroenterology;;   biopsy on throat     hx of    CATARACT EXTRACTION Bilateral    Dr. Quentin Ore   COLONOSCOPY N/A 05/23/2020   Procedure: COLONOSCOPY;  Surgeon: Jackquline Denmark, MD;  Location: [facility];  Service: Gastroenterology;  Laterality: N/A;   ENTEROSCOPY N/A 10/07/2020   Procedure: ENTEROSCOPY;  Surgeon: [name], MD;  Location: [facility]COPY;  Service: Endoscopy;  Laterality: N/A;   ESOPHAGOGASTRODUODENOSCOPY Left 04/16/2018   Procedure: ESOPHAGOGASTRODUODENOSCOPY (EGD);  Surgeon: Juanita Craver, MD;  Location: [facility];  Service: Endoscopy;  Laterality: Left;   ESOPHAGOGASTRODUODENOSCOPY (EGD) WITH PROPOFOL N/A 05/23/2020   Procedure: ESOPHAGOGASTRODUODENOSCOPY (EGD) WITH PROPOFOL;  Surgeon: Jackquline Denmark, MD;  Location: [facility];  Service: Gastroenterology;  Laterality: N/A;   ESOPHAGOGASTRODUODENOSCOPY (EGD) WITH PROPOFOL N/A 12/09/2020   Procedure: ESOPHAGOGASTRODUODENOSCOPY (EGD) WITH PROPOFOL;  Surgeon: Thornton Park, MD;  Location: [facility];  Service: Gastroenterology;  Laterality: N/A;   EYE SURGERY     FOOT SURGERY     HOT HEMOSTASIS N/A 04/16/2018   Procedure: HOT HEMOSTASIS (ARGON PLASMA COAGULATION/BICAP);  Surgeon: Juanita Craver, MD;  Location: [facility];  Service: Endoscopy;  Laterality: N/A;   HOT HEMOSTASIS N/A 05/23/2020  Procedure: HOT HEMOSTASIS (ARGON PLASMA COAGULATION/BICAP);  Surgeon: Jackquline Denmark, MD;  Location: [facility];  Service: Gastroenterology;  Laterality: N/A;   HOT HEMOSTASIS N/A 12/09/2020   Procedure: HOT HEMOSTASIS (ARGON PLASMA COAGULATION/BICAP);  Surgeon: Thornton Park, MD;  Location: [facility];  Service: Gastroenterology;  Laterality: N/A;   LYMPHADENECTOMY Bilateral 02/27/2014   Procedure: BILATERAL LYMPHADENECTOMY;  Surgeon: Alexis Frock, MD;  Location: [facility];   Service: Urology;  Laterality: Bilateral;   POLYPECTOMY  05/23/2020   Procedure: POLYPECTOMY;  Surgeon: Jackquline Denmark, MD;  Location: [facility];  Service: Gastroenterology;;   PROSTATE BIOPSY  12/2013   Gleason 4+3=7, volume 31.31 cc   ROBOT ASSISTED LAPAROSCOPIC RADICAL PROSTATECTOMY N/A 02/27/2014   Procedure: ROBOTIC ASSISTED LAPAROSCOPIC RADICAL PROSTATECTOMY WITH INDOCYANINE GREEN DYE;  Surgeon: Alexis Frock, MD;  Location: [facility];  Service: Urology;  Laterality: N/A;   Patient Active Problem List   Diagnosis Date Noted   Hepatitis C test positive 12/04/2021   Abscess of chest wall 12/04/2021   History of epistaxis 12/04/2021   Debility 04/15/2021   History of alcohol abuse 04/13/2021   Diabetic hypoglycemia ([place]) 04/12/2021   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/12/2021   Weakness of both legs    Adenomatous duodenal polyp    AVM (arteriovenous malformation) of duodenum, acquired with hemorrhage    Acute on chronic anemia 10/22/2020   Angiodysplasia of intestine    Iron deficiency anemia 05/22/2020   Constipation 05/22/2020   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 ([place])    History of CVA (cerebrovascular accident) 04/23/2020   Hyperlipidemia associated with type 2 diabetes mellitus ([place]) 04/23/2020   CKD (chronic kidney disease) stage 3, GFR 30-59 ml/min ([place]) 04/23/2020   Tobacco use 04/23/2020   Seizure disorder ([place]) 11/08/2018   Hypertension associated with diabetes ([place]) 11/08/2018   Type 2 diabetes mellitus without complication ([place]) 02/02/5101   Prostate cancer ([place]) 02/27/2014   Bunion 01/17/2013   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    ONSET DATE: Jan 2022; referral 01/05/2022   REFERRING DIAG:  I63.9 (ICD-10-CM) - Left basal ganglia embolic stroke ([place])  H85.2 (ICD-10-CM) - Speech and language deficits    THERAPY DIAG:  Aphasia  Verbal apraxia  Cognitive communication deficit  Rationale for Evaluation and Treatment  Rehabilitation  SUBJECTIVE:   SUBJECTIVE STATEMENT: "I didn't find my stuff I came back"    PAIN:  Are you having pain? No   OBJECTIVE:   TODAY'S TREATMENT:   01-27-22: Target anomia compensations of description through responsive naming task, with SLP providing 3 verbal clues and pt ID target word. 80% accuracy on initial attempt, able to ID x2 given mod-A. Usual repetitions provided. Use of Semantic Feature Analysis to facilitate intentional circumlocution and strengthen lexical connections to enable word finding. Pt able to ID 75% of target stimuli upon initial presentation. Generates salient features given questioning cues with 80% accuracy. Most challenging feature was description, requiring max-A from SLP.   10-9-23[name] named 7/10 [employer] teams to picture of their logo with extended time - he ID apraxic/aphasic errors and self corrected with occasional min phonemic cues. He named remaining 3 with mod phonemic cues.   To target word finding, sentence generation and verbal apraxia,  Pharmacist, hospital ([name]) was utilized. The pt generated 3 subjects and objects for 2 verbs (drive, measure), for a total of 6 subject objects. Pt required usual  min to mod semantic and questioning cues. Pt generated 6 complex sentences by answering "  wh" questions. Pt required occasional min semantic cue and extended time cues to generate complex sentences. Provided these sentences and 5-9 word sentences to add oral reading to HEP for verbal apraxia.  01-22-22: Pt able to name 8/10 target stimuli from previous session with supervision, remaining 2 with mod phonemic cueing. Use of verb pictures, with pt able to generate accurate verb in 12/15 trials. Targeted word finding, verbal apraxia, sentence generation with Pharmacist, hospital ([name]). For the verbs watch and read, the patient generated 3 subject and objects for each verb, a total of 6 subjects and objects with usual mod  verbal, questioning, written cues required. Pt generated x4 complex sentences, 2 for each verb, by answering "wh" questions with max cues. Assist pt completion of PROM, see above.   01-20-22: Target personally relevant naming, with goal of naming football teams. Pt able to generate x2 with mod-A. Support pt naming with use of picture cards. Given visual A, pt names x4. Additional 4 named with max-A, phonemic and questioning cues. SLP provided pt with handout of targeted teams to practice for HEP. Led pt through [employer] with target word "play". Pt generates x1 who + what with min-A. Additional x2 whos generated with SLP supplying the target what. Following, pt able to answer x4 [facility]- questions (2 for 2 sets), resulting in expanded utterance with increased content words, averaging 5 content words across 4 trials.   PATIENT EDUCATION: Education details: see above Person educated: Patient Education method: Consulting civil engineer, Demonstration, Verbal cues, and Handouts Education comprehension: verbalized understanding, returned demonstration, verbal cues required, and needs further education   GOALS: Goals reviewed with patient? Yes  SHORT TERM GOALS: Target date: 02/15/2022  Pt will name 10 items in personally relevant category using compensations when needed with occasional min-A over 2 sessions Baseline: Goal status: INITIAL  2.  Pt will read sentences using compensations for verbal apraxia, demonstrating 90% intelligibility in 15/20 trials with occasional min-A over 2 sessions Baseline:  Goal status: INITIAL  3.  Pt will complete sentence level structured language tasks with 80% accuracy with occasional mod-A over 2 sessions Baseline:  Goal status: INITIAL  4.  Pt will report daily compliance with HEP over 1 week period to maximize therapy efficacy Baseline:  Goal status: INITIAL   LONG TERM GOALS: Target date: 04/05/2022  Pt will generate 3+ sentence response to  given prompt (open ended question, picture description, etc.), with use of compensations as needed, with occasional min-A over 2 sessions Baseline:  Goal status: INITIAL  2.  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 resulting in 95% intelligibility in 15 minute conversation with occasional min-A Baseline:  Goal status: INITIAL  3.  Pt will complete sentence level structured language tasks with 90% accuracy with rare min-A over 2 sessions Baseline:  Goal status: INITIAL  4.  Pt will report subjective improvement in communication efficacy and confidence with communication partners at home and in community via PROM by d/c Baseline:  Goal status: INITIAL   ASSESSMENT:  CLINICAL IMPRESSION: Patient is a 76 y.o. M who was seen today for cognitive linguistic evaluation. Pt presents with moderate expressive aphasia and apraxia. Pt's verbal expression is impaired at discourse level, requiring extended time to answer questions, provide personal information, and express thoughts and ideas. Vague language and disorganization, with occasional instances of anomia/dysnomia, impede overall clarity of message. Mild receptive language deficits evidenced this date with pt benefiting from usual repetition and simplification of message. Pt using self-selected  strategies to repeat back to aid in auditory comprehension which is overall successful during today's evaluation. Speech production is characterized by usual self-correction and dysfluencies which further contribute to breakdown in communication efficacy. Pt aware of apraxic errors and is attempting to self-correct which is successful approximately 80% of the time. Voice is noted to have strained quality, most notable in discourse level tasks, and with resolution during simple reading or sustained "ah" speech tasks. SLP recommends skilled ST interventions to improve pt's ability to communicate thoughts across communication partners and situations.    OBJECTIVE IMPAIRMENTS include aphasia and apraxia. These impairments are limiting patient from effectively communicating at home and in community. Factors affecting potential to achieve goals and functional outcome are ability to learn/carryover information, cooperation/participation level, previous level of function, and family/community support. Patient will benefit from skilled SLP services to address above impairments and improve overall function.  REHAB POTENTIAL: Good  PLAN: SLP FREQUENCY: 2x/week  SLP DURATION: 12 weeks  PLANNED INTERVENTIONS: Language facilitation, Cueing hierachy, Internal/external aids, Functional tasks, SLP instruction and feedback, Compensatory strategies, and Patient/family education    JEREMIE GIANGRANDE, CCC-SLP 01/27/2022, 11:03 AM

## 2022-02-03 ENCOUNTER — Ambulatory Visit: Payer: Medicare Other | Admitting: Speech Pathology

## 2022-02-03 NOTE — Therapy (Deleted)
OUTPATIENT SPEECH LANGUAGE PATHOLOGY TREATMENT NOTE   Patient Name: Gregory Nunez. MRN: [id number] DOB:1945-06-29, 76 y.o., male Today's Date: 02/03/2022  PCP: Farrel Gordon, DO REFERRING PROVIDER: Frann Rider, NP      Past Medical History:  Diagnosis Date   Acute metabolic encephalopathy 04/25/735   Acute metabolic encephalopathy 1/0/6269   Alcohol use 04/23/2020   Alcohol withdrawal seizure with complication, with unspecified complication ([place]) 4/85/4627   CAO (chronic airflow obstruction) ([facility])    Cataract    OD   CVA (cerebral vascular accident) ([place])    Depression    Diabetes mellitus without complication ([place])    Diabetic retinopathy ([place])    NPDR OU   GERD (gastroesophageal reflux disease)    if drinks alcohol   GI bleed 10/05/2020   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   Patient is approximately 5-week status post bunion correction left foot   Hyperlipidemia    Hypertension    Hypertensive retinopathy    OU   Hypoglycemia 10/22/2020   Malignant neoplasm of prostate ([place]) 01/09/2014   Neuropathy    Pancreatitis    Pneumococcal vaccination administered at current visit 11/10/2020   Prostate cancer ([place]) 12/19/2013   Gleason 4+3=7, volume 31.31 cc   Rhabdomyolysis 04/12/2021   Sciatica    Shortness of breath dyspnea    with exertion    Past Surgical History:  Procedure Laterality Date   BIOPSY  04/16/2018   Procedure: BIOPSY;  Surgeon: Juanita Craver, MD;  Location: [facility];  Service: Endoscopy;;   BIOPSY  05/23/2020   Procedure: BIOPSY;  Surgeon: Jackquline Denmark, MD;  Location: [facility];  Service: Gastroenterology;;  EGD and COLON   BIOPSY  12/09/2020   Procedure: BIOPSY;  Surgeon: Thornton Park, MD;  Location: [facility]COPY;  Service: Gastroenterology;;   biopsy on throat     hx of    CATARACT EXTRACTION Bilateral    Dr. Quentin Ore   COLONOSCOPY N/A 05/23/2020   Procedure: COLONOSCOPY;  Surgeon: Jackquline Denmark, MD;  Location: [facility]  ENDOSCOPY;  Service: Gastroenterology;  Laterality: N/A;   ENTEROSCOPY N/A 10/07/2020   Procedure: ENTEROSCOPY;  Surgeon: [name], MD;  Location: [facility] ENDOSCOPY;  Service: Endoscopy;  Laterality: N/A;   ESOPHAGOGASTRODUODENOSCOPY Left 04/16/2018   Procedure: ESOPHAGOGASTRODUODENOSCOPY (EGD);  Surgeon: Juanita Craver, MD;  Location: [facility];  Service: Endoscopy;  Laterality: Left;   ESOPHAGOGASTRODUODENOSCOPY (EGD) WITH PROPOFOL N/A 05/23/2020   Procedure: ESOPHAGOGASTRODUODENOSCOPY (EGD) WITH PROPOFOL;  Surgeon: Jackquline Denmark, MD;  Location: [facility];  Service: Gastroenterology;  Laterality: N/A;   ESOPHAGOGASTRODUODENOSCOPY (EGD) WITH PROPOFOL N/A 12/09/2020   Procedure: ESOPHAGOGASTRODUODENOSCOPY (EGD) WITH PROPOFOL;  Surgeon: Thornton Park, MD;  Location: [place];  Service: Gastroenterology;  Laterality: N/A;   EYE SURGERY     FOOT SURGERY     HOT HEMOSTASIS N/A 04/16/2018   Procedure: HOT HEMOSTASIS (ARGON PLASMA COAGULATION/BICAP);  Surgeon: Juanita Craver, MD;  Location: [facility];  Service: Endoscopy;  Laterality: N/A;   HOT HEMOSTASIS N/A 05/23/2020   Procedure: HOT HEMOSTASIS (ARGON PLASMA COAGULATION/BICAP);  Surgeon: Jackquline Denmark, MD;  Location: [facility];  Service: Gastroenterology;  Laterality: N/A;   HOT HEMOSTASIS N/A 12/09/2020   Procedure: HOT HEMOSTASIS (ARGON PLASMA COAGULATION/BICAP);  Surgeon: Thornton Park, MD;  Location: [place];  Service: Gastroenterology;  Laterality: N/A;   LYMPHADENECTOMY Bilateral 02/27/2014   Procedure: BILATERAL LYMPHADENECTOMY;  Surgeon: Alexis Frock, MD;  Location: [facility];  Service: Urology;  Laterality: Bilateral;   POLYPECTOMY  05/23/2020   Procedure: POLYPECTOMY;  Surgeon: Jackquline Denmark, MD;  Location: [facility];  Service: Gastroenterology;;   PROSTATE BIOPSY  12/2013   Gleason 4+3=7, volume 31.31 cc   ROBOT ASSISTED LAPAROSCOPIC RADICAL PROSTATECTOMY N/A 02/27/2014   Procedure: ROBOTIC ASSISTED LAPAROSCOPIC  RADICAL PROSTATECTOMY WITH INDOCYANINE GREEN DYE;  Surgeon: Alexis Frock, MD;  Location: [facility];  Service: Urology;  Laterality: N/A;   Patient Active Problem List   Diagnosis Date Noted   Hepatitis C test positive 12/04/2021   Abscess of chest wall 12/04/2021   History of epistaxis 12/04/2021   Debility 04/15/2021   History of alcohol abuse 04/13/2021   Diabetic hypoglycemia ([place]) 04/12/2021   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/12/2021   Weakness of both legs    Adenomatous duodenal polyp    AVM (arteriovenous malformation) of duodenum, acquired with hemorrhage    Acute on chronic anemia 10/22/2020   Angiodysplasia of intestine    Iron deficiency anemia 05/22/2020   Constipation 05/22/2020   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 ([place])    History of CVA (cerebrovascular accident) 04/23/2020   Hyperlipidemia associated with type 2 diabetes mellitus ([place]) 04/23/2020   CKD (chronic kidney disease) stage 3, GFR 30-59 ml/min ([place]) 04/23/2020   Tobacco use 04/23/2020   Seizure disorder ([place]) 11/08/2018   Hypertension associated with diabetes ([place]) 11/08/2018   Type 2 diabetes mellitus without complication ([place]) 13/24/4010   Prostate cancer ([place]) 02/27/2014   Bunion 01/17/2013   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    ONSET DATE: Jan 2022; referral 01/05/2022   REFERRING DIAG:  I63.9 (ICD-10-CM) - Left basal ganglia embolic stroke ([place])  U72.5 (ICD-10-CM) - Speech and language deficits    THERAPY DIAG:  No diagnosis found.  Rationale for Evaluation and Treatment Rehabilitation  SUBJECTIVE:   SUBJECTIVE STATEMENT: ***   PAIN:  Are you having pain? No   OBJECTIVE:   TODAY'S TREATMENT:   02-03-22: ***  01-27-22: Target anomia compensations of description through responsive naming task, with SLP providing 3 verbal clues and pt ID target word. 80% accuracy on initial attempt, able to ID x2 given mod-A. Usual repetitions provided. Use of  Semantic Feature Analysis to facilitate intentional circumlocution and strengthen lexical connections to enable word finding. Pt able to ID 75% of target stimuli upon initial presentation. Generates salient features given questioning cues with 80% accuracy. Most challenging feature was description, requiring max-A from SLP.   10-9-23[name] named 7/10 [employer] teams to picture of their logo with extended time - he ID apraxic/aphasic errors and self corrected with occasional min phonemic cues. He named remaining 3 with mod phonemic cues.   To target word finding, sentence generation and verbal apraxia,  Pharmacist, hospital ([name]) was utilized. The pt generated 3 subjects and objects for 2 verbs (drive, measure), for a total of 6 subject objects. Pt required usual  min to mod semantic and questioning cues. Pt generated 6 complex sentences by answering "wh" questions. Pt required occasional min semantic cue and extended time cues to generate complex sentences. Provided these sentences and 5-9 word sentences to add oral reading to HEP for verbal apraxia.  01-22-22: Pt able to name 8/10 target stimuli from previous session with supervision, remaining 2 with mod phonemic cueing. Use of verb pictures, with pt able to generate accurate verb in 12/15 trials. Targeted word finding, verbal apraxia, sentence generation with Pharmacist, hospital ([name]). For the verbs watch and read, the patient generated 3 subject and objects for each verb,  a total of 6 subjects and objects with usual mod verbal, questioning, written cues required. Pt generated x4 complex sentences, 2 for each verb, by answering "wh" questions with max cues. Assist pt completion of PROM, see above.   01-20-22: Target personally relevant naming, with goal of naming football teams. Pt able to generate x2 with mod-A. Support pt naming with use of picture cards. Given visual A, pt names x4. Additional 4 named with max-A, phonemic  and questioning cues. SLP provided pt with handout of targeted teams to practice for HEP. Led pt through [employer] with target word "play". Pt generates x1 who + what with min-A. Additional x2 whos generated with SLP supplying the target what. Following, pt able to answer x4 [facility]- questions (2 for 2 sets), resulting in expanded utterance with increased content words, averaging 5 content words across 4 trials.   PATIENT EDUCATION: Education details: see above Person educated: Patient Education method: Consulting civil engineer, Demonstration, Verbal cues, and Handouts Education comprehension: verbalized understanding, returned demonstration, verbal cues required, and needs further education   GOALS: Goals reviewed with patient? Yes  SHORT TERM GOALS: Target date: 02/15/2022  Pt will name 10 items in personally relevant category using compensations when needed with occasional min-A over 2 sessions Baseline: Goal status: INITIAL  2.  Pt will read sentences using compensations for verbal apraxia, demonstrating 90% intelligibility in 15/20 trials with occasional min-A over 2 sessions Baseline:  Goal status: INITIAL  3.  Pt will complete sentence level structured language tasks with 80% accuracy with occasional mod-A over 2 sessions Baseline:  Goal status: INITIAL  4.  Pt will report daily compliance with HEP over 1 week period to maximize therapy efficacy Baseline:  Goal status: INITIAL   LONG TERM GOALS: Target date: 04/05/2022  Pt will generate 3+ sentence response to given prompt (open ended question, picture description, etc.), with use of compensations as needed, with occasional min-A over 2 sessions Baseline:  Goal status: INITIAL  2.  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 resulting in 95% intelligibility in 15 minute conversation with occasional min-A Baseline:  Goal status: INITIAL  3.  Pt will complete sentence level structured language tasks with  90% accuracy with rare min-A over 2 sessions Baseline:  Goal status: INITIAL  4.  Pt will report subjective improvement in communication efficacy and confidence with communication partners at home and in community via PROM by d/c Baseline:  Goal status: INITIAL   ASSESSMENT:  CLINICAL IMPRESSION: Patient is a 76 y.o. M who was seen today for cognitive linguistic evaluation. Pt presents with moderate expressive aphasia and apraxia. Pt's verbal expression is impaired at discourse level, requiring extended time to answer questions, provide personal information, and express thoughts and ideas. Vague language and disorganization, with occasional instances of anomia/dysnomia, impede overall clarity of message. Mild receptive language deficits evidenced this date with pt benefiting from usual repetition and simplification of message. Pt using self-selected strategies to repeat back to aid in auditory comprehension which is overall successful during today's evaluation. Speech production is characterized by usual self-correction and dysfluencies which further contribute to breakdown in communication efficacy. Pt aware of apraxic errors and is attempting to self-correct which is successful approximately 80% of the time. Voice is noted to have strained quality, most notable in discourse level tasks, and with resolution during simple reading or sustained "ah" speech tasks. SLP recommends skilled ST interventions to improve pt's ability to communicate thoughts across communication partners and situations.   OBJECTIVE  IMPAIRMENTS include aphasia and apraxia. These impairments are limiting patient from effectively communicating at home and in community. Factors affecting potential to achieve goals and functional outcome are ability to learn/carryover information, cooperation/participation level, previous level of function, and family/community support. Patient will benefit from skilled SLP services to address above  impairments and improve overall function.  REHAB POTENTIAL: Good  PLAN: SLP FREQUENCY: 2x/week  SLP DURATION: 12 weeks  PLANNED INTERVENTIONS: Language facilitation, Cueing hierachy, Internal/external aids, Functional tasks, SLP instruction and feedback, Compensatory strategies, and Patient/family education    JAREL CUADRA, CCC-SLP 02/03/2022, 7:53 AM

## 2022-02-05 ENCOUNTER — Ambulatory Visit: Payer: Medicare Other | Admitting: Speech Pathology

## 2022-02-05 NOTE — Therapy (Deleted)
OUTPATIENT SPEECH LANGUAGE PATHOLOGY TREATMENT NOTE   Patient Name: Gregory Nunez. MRN: [id number] DOB:[birth year], 76 y.o., male Today's Date: 02/05/2022  PCP: Farrel Gordon, DO REFERRING PROVIDER: Frann Rider, NP      Past Medical History:  Diagnosis Date   Acute metabolic encephalopathy 11/18/4233   Acute metabolic encephalopathy 06/22/1441   Alcohol use 04/23/2020   Alcohol withdrawal seizure with complication, with unspecified complication ([place]) 1/54/0086   CAO (chronic airflow obstruction) ([facility])    Cataract    OD   CVA (cerebral vascular accident) ([place])    Depression    Diabetes mellitus without complication ([place])    Diabetic retinopathy ([place])    NPDR OU   GERD (gastroesophageal reflux disease)    if drinks alcohol   GI bleed 10/05/2020   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   Patient is approximately 5-week status post bunion correction left foot   Hyperlipidemia    Hypertension    Hypertensive retinopathy    OU   Hypoglycemia 10/22/2020   Malignant neoplasm of prostate ([place]) 01/09/2014   Neuropathy    Pancreatitis    Pneumococcal vaccination administered at current visit 11/10/2020   Prostate cancer ([place]) 12/19/2013   Gleason 4+3=7, volume 31.31 cc   Rhabdomyolysis 04/12/2021   Sciatica    Shortness of breath dyspnea    with exertion    Past Surgical History:  Procedure Laterality Date   BIOPSY  04/16/2018   Procedure: BIOPSY;  Surgeon: Juanita Craver, MD;  Location: [facility];  Service: Endoscopy;;   BIOPSY  05/23/2020   Procedure: BIOPSY;  Surgeon: Jackquline Denmark, MD;  Location: [facility];  Service: Gastroenterology;;  EGD and COLON   BIOPSY  12/09/2020   Procedure: BIOPSY;  Surgeon: Thornton Park, MD;  Location: [facility] ENDOSCOPY;  Service: Gastroenterology;;   biopsy on throat     hx of    CATARACT EXTRACTION Bilateral    Dr. Quentin Ore   COLONOSCOPY N/A 05/23/2020   Procedure: COLONOSCOPY;  Surgeon: Jackquline Denmark, MD;  Location: [facility]  ENDOSCOPY;  Service: Gastroenterology;  Laterality: N/A;   ENTEROSCOPY N/A 10/07/2020   Procedure: ENTEROSCOPY;  Surgeon: [name], MD;  Location: [facility] ENDOSCOPY;  Service: Endoscopy;  Laterality: N/A;   ESOPHAGOGASTRODUODENOSCOPY Left 04/16/2018   Procedure: ESOPHAGOGASTRODUODENOSCOPY (EGD);  Surgeon: Juanita Craver, MD;  Location: [facility];  Service: Endoscopy;  Laterality: Left;   ESOPHAGOGASTRODUODENOSCOPY (EGD) WITH PROPOFOL N/A 05/23/2020   Procedure: ESOPHAGOGASTRODUODENOSCOPY (EGD) WITH PROPOFOL;  Surgeon: Jackquline Denmark, MD;  Location: [facility];  Service: Gastroenterology;  Laterality: N/A;   ESOPHAGOGASTRODUODENOSCOPY (EGD) WITH PROPOFOL N/A 12/09/2020   Procedure: ESOPHAGOGASTRODUODENOSCOPY (EGD) WITH PROPOFOL;  Surgeon: Thornton Park, MD;  Location: [facility];  Service: Gastroenterology;  Laterality: N/A;   EYE SURGERY     FOOT SURGERY     HOT HEMOSTASIS N/A 04/16/2018   Procedure: HOT HEMOSTASIS (ARGON PLASMA COAGULATION/BICAP);  Surgeon: Juanita Craver, MD;  Location: [facility];  Service: Endoscopy;  Laterality: N/A;   HOT HEMOSTASIS N/A 05/23/2020   Procedure: HOT HEMOSTASIS (ARGON PLASMA COAGULATION/BICAP);  Surgeon: Jackquline Denmark, MD;  Location: [facility];  Service: Gastroenterology;  Laterality: N/A;   HOT HEMOSTASIS N/A 12/09/2020   Procedure: HOT HEMOSTASIS (ARGON PLASMA COAGULATION/BICAP);  Surgeon: Thornton Park, MD;  Location: [facility];  Service: Gastroenterology;  Laterality: N/A;   LYMPHADENECTOMY Bilateral 02/27/2014   Procedure: BILATERAL LYMPHADENECTOMY;  Surgeon: Alexis Frock, MD;  Location: [facility];  Service: Urology;  Laterality: Bilateral;   POLYPECTOMY  05/23/2020   Procedure: POLYPECTOMY;  Surgeon: Jackquline Denmark, MD;  Location: [facility];  Service: Gastroenterology;;   PROSTATE BIOPSY  12/2013   Gleason 4+3=7, volume 31.31 cc   ROBOT ASSISTED LAPAROSCOPIC RADICAL PROSTATECTOMY N/A 02/27/2014   Procedure: ROBOTIC ASSISTED LAPAROSCOPIC  RADICAL PROSTATECTOMY WITH INDOCYANINE GREEN DYE;  Surgeon: Alexis Frock, MD;  Location: [facility];  Service: Urology;  Laterality: N/A;   Patient Active Problem List   Diagnosis Date Noted   Hepatitis C test positive 12/04/2021   Abscess of chest wall 12/04/2021   History of epistaxis 12/04/2021   Debility 04/15/2021   History of alcohol abuse 04/13/2021   Diabetic hypoglycemia ([place]) 04/12/2021   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/12/2021   Weakness of both legs    Adenomatous duodenal polyp    AVM (arteriovenous malformation) of duodenum, acquired with hemorrhage    Acute on chronic anemia 10/22/2020   Angiodysplasia of intestine    Iron deficiency anemia 05/22/2020   Constipation 05/22/2020   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 ([place])    History of CVA (cerebrovascular accident) 04/23/2020   Hyperlipidemia associated with type 2 diabetes mellitus ([place]) 04/23/2020   CKD (chronic kidney disease) stage 3, GFR 30-59 ml/min ([place]) 04/23/2020   Tobacco use 04/23/2020   Seizure disorder ([place]) 11/08/2018   Hypertension associated with diabetes ([place]) 11/08/2018   Type 2 diabetes mellitus without complication ([place]) 63/04/6008   Prostate cancer ([place]) 02/27/2014   Bunion 01/17/2013   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    ONSET DATE: Jan 2022; referral 01/05/2022   REFERRING DIAG:  I63.9 (ICD-10-CM) - Left basal ganglia embolic stroke ([place])  X32.3 (ICD-10-CM) - Speech and language deficits    THERAPY DIAG:  No diagnosis found.  Rationale for Evaluation and Treatment Rehabilitation  SUBJECTIVE:   SUBJECTIVE STATEMENT: ***   PAIN:  Are you having pain? No   OBJECTIVE:   TODAY'S TREATMENT:   02-05-22: ***  01-27-22: Target anomia compensations of description through responsive naming task, with SLP providing 3 verbal clues and pt ID target word. 80% accuracy on initial attempt, able to ID x2 given mod-A. Usual repetitions provided. Use of  Semantic Feature Analysis to facilitate intentional circumlocution and strengthen lexical connections to enable word finding. Pt able to ID 75% of target stimuli upon initial presentation. Generates salient features given questioning cues with 80% accuracy. Most challenging feature was description, requiring max-A from SLP.   10-9-23[name] named 7/10 [employer] teams to picture of their logo with extended time - he ID apraxic/aphasic errors and self corrected with occasional min phonemic cues. He named remaining 3 with mod phonemic cues.   To target word finding, sentence generation and verbal apraxia,  Pharmacist, hospital ([name]) was utilized. The pt generated 3 subjects and objects for 2 verbs (drive, measure), for a total of 6 subject objects. Pt required usual  min to mod semantic and questioning cues. Pt generated 6 complex sentences by answering "wh" questions. Pt required occasional min semantic cue and extended time cues to generate complex sentences. Provided these sentences and 5-9 word sentences to add oral reading to HEP for verbal apraxia.  01-22-22: Pt able to name 8/10 target stimuli from previous session with supervision, remaining 2 with mod phonemic cueing. Use of verb pictures, with pt able to generate accurate verb in 12/15 trials. Targeted word finding, verbal apraxia, sentence generation with Pharmacist, hospital ([name]). For the verbs watch and read, the patient generated 3 subject and objects for each verb,  a total of 6 subjects and objects with usual mod verbal, questioning, written cues required. Pt generated x4 complex sentences, 2 for each verb, by answering "wh" questions with max cues. Assist pt completion of PROM, see above.   01-20-22: Target personally relevant naming, with goal of naming football teams. Pt able to generate x2 with mod-A. Support pt naming with use of picture cards. Given visual A, pt names x4. Additional 4 named with max-A, phonemic  and questioning cues. SLP provided pt with handout of targeted teams to practice for HEP. Led pt through [employer] with target word "play". Pt generates x1 who + what with min-A. Additional x2 whos generated with SLP supplying the target what. Following, pt able to answer x4 [facility]- questions (2 for 2 sets), resulting in expanded utterance with increased content words, averaging 5 content words across 4 trials.   PATIENT EDUCATION: Education details: see above Person educated: Patient Education method: Consulting civil engineer, Demonstration, Verbal cues, and Handouts Education comprehension: verbalized understanding, returned demonstration, verbal cues required, and needs further education   GOALS: Goals reviewed with patient? Yes  SHORT TERM GOALS: Target date: 02/15/2022  Pt will name 10 items in personally relevant category using compensations when needed with occasional min-A over 2 sessions Baseline: Goal status: INITIAL  2.  Pt will read sentences using compensations for verbal apraxia, demonstrating 90% intelligibility in 15/20 trials with occasional min-A over 2 sessions Baseline:  Goal status: INITIAL  3.  Pt will complete sentence level structured language tasks with 80% accuracy with occasional mod-A over 2 sessions Baseline:  Goal status: INITIAL  4.  Pt will report daily compliance with HEP over 1 week period to maximize therapy efficacy Baseline:  Goal status: INITIAL   LONG TERM GOALS: Target date: 04/05/2022  Pt will generate 3+ sentence response to given prompt (open ended question, picture description, etc.), with use of compensations as needed, with occasional min-A over 2 sessions Baseline:  Goal status: INITIAL  2.  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 resulting in 95% intelligibility in 15 minute conversation with occasional min-A Baseline:  Goal status: INITIAL  3.  Pt will complete sentence level structured language tasks with  90% accuracy with rare min-A over 2 sessions Baseline:  Goal status: INITIAL  4.  Pt will report subjective improvement in communication efficacy and confidence with communication partners at home and in community via PROM by d/c Baseline:  Goal status: INITIAL   ASSESSMENT:  CLINICAL IMPRESSION: Patient is a 75 y.o. M who was seen today for cognitive linguistic evaluation. Pt presents with moderate expressive aphasia and apraxia. Pt's verbal expression is impaired at discourse level, requiring extended time to answer questions, provide personal information, and express thoughts and ideas. Vague language and disorganization, with occasional instances of anomia/dysnomia, impede overall clarity of message. Mild receptive language deficits evidenced this date with pt benefiting from usual repetition and simplification of message. Pt using self-selected strategies to repeat back to aid in auditory comprehension which is overall successful during today's evaluation. Speech production is characterized by usual self-correction and dysfluencies which further contribute to breakdown in communication efficacy. Pt aware of apraxic errors and is attempting to self-correct which is successful approximately 80% of the time. Voice is noted to have strained quality, most notable in discourse level tasks, and with resolution during simple reading or sustained "ah" speech tasks. SLP recommends skilled ST interventions to improve pt's ability to communicate thoughts across communication partners and situations.   OBJECTIVE  IMPAIRMENTS include aphasia and apraxia. These impairments are limiting patient from effectively communicating at home and in community. Factors affecting potential to achieve goals and functional outcome are ability to learn/carryover information, cooperation/participation level, previous level of function, and family/community support. Patient will benefit from skilled SLP services to address above  impairments and improve overall function.  REHAB POTENTIAL: Good  PLAN: SLP FREQUENCY: 2x/week  SLP DURATION: 12 weeks  PLANNED INTERVENTIONS: Language facilitation, Cueing hierachy, Internal/external aids, Functional tasks, SLP instruction and feedback, Compensatory strategies, and Patient/family education    JAHMEL FLANNAGAN, CCC-SLP 02/05/2022, 8:02 AM

## 2022-02-09 ENCOUNTER — Encounter: Payer: Medicare Other | Admitting: Infectious Diseases

## 2022-02-10 ENCOUNTER — Ambulatory Visit: Payer: Medicare Other | Admitting: Speech Pathology

## 2022-02-12 ENCOUNTER — Telehealth: Payer: Self-pay | Admitting: Speech Pathology

## 2022-02-12 ENCOUNTER — Ambulatory Visit: Payer: Medicare Other | Admitting: Speech Pathology

## 2022-02-15 ENCOUNTER — Encounter: Payer: Self-pay | Admitting: Speech Pathology

## 2022-02-15 ENCOUNTER — Ambulatory Visit: Payer: Medicare Other | Admitting: Speech Pathology

## 2022-02-15 DIAGNOSIS — R482 Apraxia: Secondary | ICD-10-CM

## 2022-02-15 DIAGNOSIS — R4701 Aphasia: Secondary | ICD-10-CM

## 2022-02-15 DIAGNOSIS — R41841 Cognitive communication deficit: Secondary | ICD-10-CM

## 2022-02-15 NOTE — Therapy (Signed)
[facility] [address] [place], [place], [address] Phone: [phone]   Fax:  [phone]  Patient Details  Name: Gregory Nunez. MRN: [id number] Date of Birth: 01-26-1946 Referring Provider:  No ref. provider found  Encounter Date: 02/15/2022  SPEECH THERAPY DISCHARGE SUMMARY  Visits from Start of Care: 5  Current functional level related to goals / functional outcomes: See goals below, unable to determine current level d/t pt not returning for therapy   Remaining deficits: Aphasia, apraxia, cognitive communication deficits   Education / Equipment: Language tasks, HEP, apraxia strategies   Patient agrees to discharge. Patient goals were not met. Patient is being discharged due to not returning since the last visit. SLP called pt at last missed visit to inform of pending d/c if did not come to next schedule visits. Pt with additional no-show so is being d/c at this time. Will require new referral for on-going ST.   SHORT TERM GOALS: Target date: 02/15/2022   Pt will name 10 items in personally relevant category using compensations when needed with occasional min-A over 2 sessions Baseline: Goal status: MET   2.  Pt will read sentences using compensations for verbal apraxia, demonstrating 90% intelligibility in 15/20 trials with occasional min-A over 2 sessions Baseline:  Goal status: NOT MET   3.  Pt will complete sentence level structured language tasks with 80% accuracy with occasional mod-A over 2 sessions Baseline:  Goal status: PARTIALLY MET   4.  Pt will report daily compliance with HEP over 1 week period to maximize therapy efficacy Baseline:  Goal status: NOT MET     LONG TERM GOALS: Target date: 04/05/2022   Pt will generate 3+ sentence response to given prompt (open ended question, picture description, etc.), with use of compensations as needed, with occasional min-A over 2  sessions Baseline:  Goal status: NOT MET   2.  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 resulting in 95% intelligibility in 15 minute conversation with occasional min-A Baseline:  Goal status: NOT MET   3.  Pt will complete sentence level structured language tasks with 90% accuracy with rare min-A over 2 sessions Baseline:  Goal status: NOT MET   4.  Pt will report subjective improvement in communication efficacy and confidence with communication partners at home and in community via PROM by d/c Baseline:  Goal status: NOT MET    HILMAN KISSLING, CCC-SLP 02/15/2022, 10:49 AM  [facility] [address] [place], [place], [address] Phone: [phone]   Fax:  [phone]

## 2022-02-17 ENCOUNTER — Ambulatory Visit: Payer: Medicare Other | Admitting: Speech Pathology

## 2022-02-22 ENCOUNTER — Encounter: Payer: Medicare Other | Admitting: Speech Pathology

## 2022-02-24 ENCOUNTER — Encounter: Payer: Medicare Other | Admitting: Speech Pathology

## 2022-04-05 ENCOUNTER — Telehealth: Payer: Self-pay | Admitting: Dietician

## 2022-04-05 ENCOUNTER — Encounter: Payer: Medicare Other | Admitting: Student

## 2022-04-05 NOTE — Telephone Encounter (Signed)
-----   Message from Rick Duff, MD sent at 04/02/2022 10:20 AM EST ----- Regarding: RE: Eye exams Hello Butch Penny! I would appreciate your help reaching out to this patient to make sure he is set up with them.   Thank you!  ----- Message ----- From: Farrel Gordon, DO Sent: 03/31/2022   1:47 PM EST To: Rick Duff, MD Subject: FW: Eye exams                                  For your OV scheduled 12/18! ----- Message ----- From: Klark Vanderhoef, Chauncey Reading, RD Sent: 03/29/2022  10:54 AM EST To: Farrel Gordon, DO Subject: Eye exams                                      Did I send this to you? I am happy to call patient if you'd like or can let Dr. Dema Severin know to discuss this when he sees him this month.  Thank you!  Butch Penny ----- Message ----- From: Dorrance Sellick, Chauncey Reading, RD Sent: 03/25/2022   3:46 PM EST To: Chauncey Reading Mila Pair, RD  I am going through your patient list with diabetes looking at eye exams. He stopped going to [facility] for monthly injection after 11/2020 appointment. 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 It looks like he was supposed to fu in 1,5,9 and 13 weeks after 11/2020 appointment.   Please advise.   Thank you!  Butch Penny

## 2022-05-03 NOTE — Telephone Encounter (Signed)
Sending letter to patient, because I couldn't reaching by phone

## 2022-08-13 ENCOUNTER — Observation Stay (HOSPITAL_COMMUNITY): Payer: 59

## 2022-08-13 ENCOUNTER — Encounter (HOSPITAL_COMMUNITY): Payer: Self-pay

## 2022-08-13 ENCOUNTER — Inpatient Hospital Stay (HOSPITAL_COMMUNITY)
Admission: EM | Admit: 2022-08-13 | Discharge: 2022-08-16 | DRG: 683 | Disposition: A | Discharge status: Home Health | Payer: 59 | Attending: Student in an Organized Health Care Education/Training Program | Admitting: Student in an Organized Health Care Education/Training Program

## 2022-08-13 ENCOUNTER — Ambulatory Visit (HOSPITAL_COMMUNITY)
Admission: EM | Admit: 2022-08-13 | Discharge: 2022-08-13 | Disposition: A | Discharge status: Home / Self Care | Payer: 59

## 2022-08-13 ENCOUNTER — Emergency Department (HOSPITAL_COMMUNITY): Payer: 59

## 2022-08-13 ENCOUNTER — Other Ambulatory Visit: Payer: Self-pay

## 2022-08-13 DIAGNOSIS — D631 Anemia in chronic kidney disease: Secondary | ICD-10-CM | POA: Diagnosis present

## 2022-08-13 DIAGNOSIS — Z8719 Personal history of other diseases of the digestive system: Secondary | ICD-10-CM

## 2022-08-13 DIAGNOSIS — E119 Type 2 diabetes mellitus without complications: Secondary | ICD-10-CM

## 2022-08-13 DIAGNOSIS — R030 Elevated blood-pressure reading, without diagnosis of hypertension: Secondary | ICD-10-CM | POA: Diagnosis not present

## 2022-08-13 DIAGNOSIS — E1142 Type 2 diabetes mellitus with diabetic polyneuropathy: Secondary | ICD-10-CM | POA: Diagnosis present

## 2022-08-13 DIAGNOSIS — Z8249 Family history of ischemic heart disease and other diseases of the circulatory system: Secondary | ICD-10-CM

## 2022-08-13 DIAGNOSIS — B182 Chronic viral hepatitis C: Secondary | ICD-10-CM | POA: Diagnosis present

## 2022-08-13 DIAGNOSIS — E1122 Type 2 diabetes mellitus with diabetic chronic kidney disease: Secondary | ICD-10-CM | POA: Diagnosis present

## 2022-08-13 DIAGNOSIS — Z9079 Acquired absence of other genital organ(s): Secondary | ICD-10-CM

## 2022-08-13 DIAGNOSIS — M542 Cervicalgia: Secondary | ICD-10-CM

## 2022-08-13 DIAGNOSIS — F32A Depression, unspecified: Secondary | ICD-10-CM | POA: Diagnosis present

## 2022-08-13 DIAGNOSIS — I69320 Aphasia following cerebral infarction: Secondary | ICD-10-CM

## 2022-08-13 DIAGNOSIS — E118 Type 2 diabetes mellitus with unspecified complications: Secondary | ICD-10-CM

## 2022-08-13 DIAGNOSIS — I129 Hypertensive chronic kidney disease with stage 1 through stage 4 chronic kidney disease, or unspecified chronic kidney disease: Secondary | ICD-10-CM | POA: Diagnosis not present

## 2022-08-13 DIAGNOSIS — R519 Headache, unspecified: Secondary | ICD-10-CM | POA: Diagnosis not present

## 2022-08-13 DIAGNOSIS — Z9842 Cataract extraction status, left eye: Secondary | ICD-10-CM

## 2022-08-13 DIAGNOSIS — N1832 Chronic kidney disease, stage 3b: Secondary | ICD-10-CM | POA: Diagnosis present

## 2022-08-13 DIAGNOSIS — Z9841 Cataract extraction status, right eye: Secondary | ICD-10-CM

## 2022-08-13 DIAGNOSIS — J449 Chronic obstructive pulmonary disease, unspecified: Secondary | ICD-10-CM | POA: Diagnosis present

## 2022-08-13 DIAGNOSIS — D509 Iron deficiency anemia, unspecified: Secondary | ICD-10-CM | POA: Diagnosis present

## 2022-08-13 DIAGNOSIS — Z9181 History of falling: Secondary | ICD-10-CM

## 2022-08-13 DIAGNOSIS — F17210 Nicotine dependence, cigarettes, uncomplicated: Secondary | ICD-10-CM | POA: Diagnosis present

## 2022-08-13 DIAGNOSIS — I152 Hypertension secondary to endocrine disorders: Secondary | ICD-10-CM

## 2022-08-13 DIAGNOSIS — Z880 Allergy status to penicillin: Secondary | ICD-10-CM

## 2022-08-13 DIAGNOSIS — E785 Hyperlipidemia, unspecified: Secondary | ICD-10-CM | POA: Diagnosis present

## 2022-08-13 DIAGNOSIS — E876 Hypokalemia: Secondary | ICD-10-CM | POA: Diagnosis present

## 2022-08-13 DIAGNOSIS — I69390 Apraxia following cerebral infarction: Secondary | ICD-10-CM

## 2022-08-13 DIAGNOSIS — H35039 Hypertensive retinopathy, unspecified eye: Secondary | ICD-10-CM | POA: Diagnosis present

## 2022-08-13 DIAGNOSIS — G40909 Epilepsy, unspecified, not intractable, without status epilepticus: Secondary | ICD-10-CM | POA: Diagnosis present

## 2022-08-13 DIAGNOSIS — Z7982 Long term (current) use of aspirin: Secondary | ICD-10-CM

## 2022-08-13 DIAGNOSIS — Z79899 Other long term (current) drug therapy: Secondary | ICD-10-CM

## 2022-08-13 DIAGNOSIS — Z91148 Patient's other noncompliance with medication regimen for other reason: Secondary | ICD-10-CM

## 2022-08-13 DIAGNOSIS — K219 Gastro-esophageal reflux disease without esophagitis: Secondary | ICD-10-CM | POA: Diagnosis present

## 2022-08-13 DIAGNOSIS — I1A0 Resistant hypertension: Secondary | ICD-10-CM | POA: Diagnosis present

## 2022-08-13 DIAGNOSIS — N184 Chronic kidney disease, stage 4 (severe): Secondary | ICD-10-CM | POA: Diagnosis present

## 2022-08-13 DIAGNOSIS — E1169 Type 2 diabetes mellitus with other specified complication: Secondary | ICD-10-CM

## 2022-08-13 DIAGNOSIS — I161 Hypertensive emergency: Secondary | ICD-10-CM | POA: Diagnosis present

## 2022-08-13 DIAGNOSIS — Z8546 Personal history of malignant neoplasm of prostate: Secondary | ICD-10-CM

## 2022-08-13 DIAGNOSIS — B181 Chronic viral hepatitis B without delta-agent: Secondary | ICD-10-CM | POA: Diagnosis present

## 2022-08-13 DIAGNOSIS — E113293 Type 2 diabetes mellitus with mild nonproliferative diabetic retinopathy without macular edema, bilateral: Secondary | ICD-10-CM | POA: Diagnosis present

## 2022-08-13 DIAGNOSIS — N179 Acute kidney failure, unspecified: Secondary | ICD-10-CM | POA: Diagnosis present

## 2022-08-13 DIAGNOSIS — I10 Essential (primary) hypertension: Secondary | ICD-10-CM | POA: Diagnosis present

## 2022-08-13 LAB — RAPID URINE DRUG SCREEN, HOSP PERFORMED
Amphetamines: NOT DETECTED
Barbiturates: NOT DETECTED
Benzodiazepines: NOT DETECTED
Cocaine: NOT DETECTED
Opiates: NOT DETECTED
Tetrahydrocannabinol: NOT DETECTED

## 2022-08-13 LAB — I-STAT CHEM 8, ED
BUN: 23 mg/dL (ref 8–23)
Calcium, Ion: 1.16 mmol/L (ref 1.15–1.40)
Chloride: 108 mmol/L (ref 98–111)
Creatinine, Ser: 2.4 mg/dL — ABNORMAL HIGH (ref 0.61–1.24)
Glucose, Bld: 130 mg/dL — ABNORMAL HIGH (ref 70–99)
HCT: 29 % — ABNORMAL LOW (ref 39.0–52.0)
Hemoglobin: 9.9 g/dL — ABNORMAL LOW (ref 13.0–17.0)
Potassium: 3.4 mmol/L — ABNORMAL LOW (ref 3.5–5.1)
Sodium: 141 mmol/L (ref 135–145)
TCO2: 25 mmol/L (ref 22–32)

## 2022-08-13 LAB — URINALYSIS, ROUTINE W REFLEX MICROSCOPIC
Bacteria, UA: NONE SEEN
Bilirubin Urine: NEGATIVE
Glucose, UA: 50 mg/dL — AB
Ketones, ur: NEGATIVE mg/dL
Leukocytes,Ua: NEGATIVE
Nitrite: NEGATIVE
Protein, ur: >=300 mg/dL — AB
Specific Gravity, Urine: 1.011 (ref 1.005–1.030)
pH: 6 (ref 5.0–8.0)

## 2022-08-13 LAB — COMPREHENSIVE METABOLIC PANEL
ALT: 16 U/L (ref 0–44)
AST: 22 U/L (ref 15–41)
Albumin: 2.9 g/dL — ABNORMAL LOW (ref 3.5–5.0)
Alkaline Phosphatase: 55 U/L (ref 38–126)
Anion gap: 7 (ref 5–15)
BUN: 20 mg/dL (ref 8–23)
CO2: 21 mmol/L — ABNORMAL LOW (ref 22–32)
Calcium: 8.8 mg/dL — ABNORMAL LOW (ref 8.9–10.3)
Chloride: 109 mmol/L (ref 98–111)
Creatinine, Ser: 2.26 mg/dL — ABNORMAL HIGH (ref 0.61–1.24)
GFR, Estimated: 30 mL/min — ABNORMAL LOW (ref >60)
Glucose, Bld: 134 mg/dL — ABNORMAL HIGH (ref 70–99)
Potassium: 3.3 mmol/L — ABNORMAL LOW (ref 3.5–5.1)
Sodium: 137 mmol/L (ref 135–145)
Total Bilirubin: 0.5 mg/dL (ref 0.3–1.2)
Total Protein: 7.6 g/dL (ref 6.5–8.1)

## 2022-08-13 LAB — CBC
HCT: 28.6 % — ABNORMAL LOW (ref 39.0–52.0)
Hemoglobin: 9.5 g/dL — ABNORMAL LOW (ref 13.0–17.0)
MCH: 30.6 pg (ref 26.0–34.0)
MCHC: 33.2 g/dL (ref 30.0–36.0)
MCV: 92.3 fL (ref 80.0–100.0)
Platelets: 156 10*3/uL (ref 150–400)
RBC: 3.1 MIL/uL — ABNORMAL LOW (ref 4.22–5.81)
RDW: 12.3 % (ref 11.5–15.5)
WBC: 4.4 10*3/uL (ref 4.0–10.5)
nRBC: 0 % (ref 0.0–0.2)

## 2022-08-13 LAB — PROTEIN / CREATININE RATIO, URINE
Creatinine, Urine: 67 mg/dL
Protein Creatinine Ratio: 7.33 mg/mg{Cre} — ABNORMAL HIGH (ref 0.00–0.15)
Total Protein, Urine: 491 mg/dL

## 2022-08-13 LAB — DIFFERENTIAL
Abs Immature Granulocytes: 0.02 10*3/uL (ref 0.00–0.07)
Basophils Absolute: 0 10*3/uL (ref 0.0–0.1)
Basophils Relative: 0 %
Eosinophils Absolute: 0.1 10*3/uL (ref 0.0–0.5)
Eosinophils Relative: 2 %
Immature Granulocytes: 1 %
Lymphocytes Relative: 21 %
Lymphs Abs: 0.9 10*3/uL (ref 0.7–4.0)
Monocytes Absolute: 0.7 10*3/uL (ref 0.1–1.0)
Monocytes Relative: 16 %
Neutro Abs: 2.6 10*3/uL (ref 1.7–7.7)
Neutrophils Relative %: 60 %

## 2022-08-13 LAB — PROTIME-INR
INR: 1.1 (ref 0.8–1.2)
Prothrombin Time: 13.9 seconds (ref 11.4–15.2)

## 2022-08-13 LAB — APTT: aPTT: 34 seconds (ref 24–36)

## 2022-08-13 LAB — CBG MONITORING, ED: Glucose-Capillary: 126 mg/dL — ABNORMAL HIGH (ref 70–99)

## 2022-08-13 LAB — GLUCOSE, CAPILLARY: Glucose-Capillary: 155 mg/dL — ABNORMAL HIGH (ref 70–99)

## 2022-08-13 MED ORDER — HYDRALAZINE HCL 25 MG PO TABS
50.0000 mg | ORAL_TABLET | ORAL | Status: AC
  Administered 2022-08-13: 50 mg via ORAL
  Filled 2022-08-13: qty 2

## 2022-08-13 MED ORDER — HYDRALAZINE HCL 20 MG/ML IJ SOLN
10.0000 mg | Freq: Once | INTRAMUSCULAR | Status: AC
  Administered 2022-08-13: 10 mg via INTRAVENOUS
  Filled 2022-08-13: qty 1

## 2022-08-13 MED ORDER — CLEVIDIPINE BUTYRATE 0.5 MG/ML IV EMUL
0.0000 mg/h | INTRAVENOUS | Status: DC
  Filled 2022-08-13: qty 100

## 2022-08-13 MED ORDER — HYDRALAZINE HCL 25 MG PO TABS
25.0000 mg | ORAL_TABLET | Freq: Once | ORAL | Status: AC
  Administered 2022-08-13: 25 mg via ORAL
  Filled 2022-08-13: qty 1

## 2022-08-13 MED ORDER — ACETAMINOPHEN 325 MG PO TABS
650.0000 mg | ORAL_TABLET | Freq: Four times a day (QID) | ORAL | Status: DC | PRN
  Administered 2022-08-14 – 2022-08-15 (×2): 650 mg via ORAL
  Filled 2022-08-13 (×2): qty 2

## 2022-08-13 MED ORDER — ACETAMINOPHEN 650 MG RE SUPP: 650.0000 mg | Freq: Four times a day (QID) | RECTAL | Status: DC | PRN

## 2022-08-13 MED ORDER — ATORVASTATIN CALCIUM 40 MG PO TABS
40.0000 mg | ORAL_TABLET | Freq: Every day | ORAL | Status: DC
  Administered 2022-08-13 – 2022-08-15 (×3): 40 mg via ORAL
  Filled 2022-08-13 (×3): qty 1

## 2022-08-13 MED ORDER — LOSARTAN POTASSIUM 50 MG PO TABS
50.0000 mg | ORAL_TABLET | ORAL | Status: AC
  Administered 2022-08-13: 50 mg via ORAL
  Filled 2022-08-13: qty 1

## 2022-08-13 MED ORDER — ENOXAPARIN SODIUM 30 MG/0.3ML IJ SOSY
30.0000 mg | PREFILLED_SYRINGE | INTRAMUSCULAR | Status: DC
  Administered 2022-08-13 – 2022-08-15 (×3): 30 mg via SUBCUTANEOUS
  Filled 2022-08-13 (×3): qty 0.3

## 2022-08-13 MED ORDER — SENNOSIDES-DOCUSATE SODIUM 8.6-50 MG PO TABS: 1.0000 | ORAL_TABLET | Freq: Every evening | ORAL | Status: DC | PRN

## 2022-08-13 MED ORDER — AMLODIPINE BESYLATE 5 MG PO TABS
5.0000 mg | ORAL_TABLET | Freq: Once | ORAL | Status: AC
  Administered 2022-08-13: 5 mg via ORAL
  Filled 2022-08-13: qty 1

## 2022-08-13 MED ORDER — LABETALOL HCL 5 MG/ML IV SOLN
20.0000 mg | INTRAVENOUS | Status: AC
  Administered 2022-08-13: 20 mg via INTRAVENOUS
  Filled 2022-08-13: qty 4

## 2022-08-13 NOTE — Hospital Course (Addendum)
Gregory Nunez. is a 77 y.o. 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    #Severe, symptomatic hypertension Presented w/ headache. Found to be significantly hypertensive w/ MAP in the 160s. CT head negative for abnormalities. Had signs of end-organ dysfunction w/ AKI on CKD3b. Received PO/IV hydralazine, IV labetalol, and PO amlodipine/losartan in the ED. Blood pressure gradually improved, with SBP in the 140-150s on the day of discharge. He was discharged with his home losartan 50 mg daily, in addition to amlodipine 5 mg daily and hydralazine 25 mg TID.   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 Cr elevated at 2.26 on admission and up to 2.8 on the day of discharge. BUN/Cr ratio suggests intrinsic etiology. UA w/ > 300 protein. Urine protein/creatinine ratio elevated at 7.3. Renal US normal. Likely in the setting of significant hypertension vs diabetes vs untreated HCV, which would put him at risk for cryoglobulinemia and immune complex deposition. ANA, ANCA, SPEP, UPEP, and free light chains were still in process by the time of discharge and will need followed up. The patient was also recommended to follow up with Dr. Malen Gauze (nephrology) in 2 weeks.     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   #Normocytic anemia Hgb stable during admission. Anemia is likely 2/2 CKD and iron deficiency anemia. He received 1 dose of IV ferrlecit and was discharged with PO iron supplementation.   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 H previously was prescribed keppra 500 mg BID but has not picked it up since 11/2021. No signs  of seizures at this time. Discontinued keppra at discharge.    #Type 2 diabetes A1c 7.1% in 11/2021 and now 5.8%. Not on any diabetic medications at home.  4/27:

## 2022-08-13 NOTE — ED Triage Notes (Addendum)
Pt came in via POV from UC d/t HA & neck pain for the past 3 days. A/Ox4, rates pain 7/10 while in triage. Does report falling 3 weeks ago & not usually having difficulty speaking. Pt noticed to stutter while speaking, HTN noted as well.

## 2022-08-13 NOTE — ED Triage Notes (Signed)
Pt states right side neck pain x 2 days. Pt reports headache for several days.  Pt reports a fall; however could not tell me when this happen.

## 2022-08-13 NOTE — Discharge Instructions (Signed)
Please go straight to the emergency department as soon as you leave urgent care for further evaluation and management. 

## 2022-08-13 NOTE — ED Notes (Signed)
Patient is being discharged from the Urgent Care and sent to the Emergency Department via POV . Per Ervin Knack, FNP, patient is in need of higher level of care due to higher level of care needed. Patient is aware and verbalizes understanding of plan of care.  Vitals:   08/13/22 1024  BP: (!) 204/95  Pulse: 93  Resp: 18  Temp: 98.1 F (36.7 C)  SpO2: 98%

## 2022-08-13 NOTE — ED Provider Notes (Signed)
Patient admitted to internal [facility] team for further blood pressure control.  Blood pressure 190s over 99.  Headaches improved.  He had unremarkable imaging and labs.  He has had a lot of falls recently.  He has not been compliant with his medications.  He has been started on oral amlodipine, Cozaar and hydralazine.  Will admit to medicine for further blood pressure titration.  Not having any chest pain.  No evidence of head bleed.  Does not seem clinically to have a stroke.  This chart was dictated using voice recognition software.  Despite best efforts to proofread,  errors can occur which can change the documentation meaning.    Virgina Norfolk, DO 08/13/22 1627

## 2022-08-13 NOTE — ED Notes (Signed)
Rn CALLED ct, ct STATES pt is 4th in line

## 2022-08-13 NOTE — H&P (Signed)
Date: 08/13/2022               Patient Name:  Gregory Nunez. MRN: [id number]  DOB: 13-May-1945 Age / Sex: 77 y.o., male   PCP: Champ Mungo, DO         Medical Service: Internal Medicine Teaching Service         Attending Physician: Dr. Oswaldo Done, Marquita Palms, *      First Contact: Dr. Karoline Caldwell, MD Pager [phone]    Second Contact: Dr. Elza Rafter, DO Pager [phone]         After Hours (After 5p/  First Contact Pager: [phone]  weekends / holidays): Second Contact Pager: [phone]   SUBJECTIVE   Chief Complaint: headache  History of Present Illness: Gregory Nunez. 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   The patient states that 2 weeks ago he fell (did not lose consciousness or hit his head) and felt fine, but this morning he woke up and felt poorly. He has been having pain in the back of his head for the past 2-3 days, so today he decided to go to an urgent care. He was noted to be significantly hypertensive at the urgent care, thus, was sent to the ED.  In the ED, the patient states that he still has the headache in the back of his head.  He denies syncopal episodes, lightheadedness, vision changes, chest pain, palpitations, SOB, abd pain, n/v/d, constipation, back pain, or urinary sxs at this time. He does state that he has felt weak for the past few months, but denies any night sweats or weight loss. Generally, he feels like his appetite has been decreased over the last few months, but he cannot quantify exactly how much he eats per day.    ED Course: BP initially 230/136, HR 90. CBC with Hb of 9.5, no leukocytosis. BMP with K of 3.3 and Cr of 2.26 (was 1.95 in August). The patient was given PO hydralazine 50 mg x 1 dose, 25 mg x 1 dose, and IV hydralazine 10 mg x 1 dose, in addition to IV labetalol 20 mg, and PO amlodipine 5 mg and losartan 50  mg.   Meds: Has not taken in >2 months Lipitor 40 mg daily Keppra 500 mg bid Losartan 50 mg daily  No outpatient medications have been marked as taking for the 08/13/22 encounter [facility] Encounter).    Past Medical History  Past Surgical History:  Procedure Laterality Date   BIOPSY  04/16/2018   Procedure: BIOPSY;  Surgeon: Charna Elizabeth, MD;  Location: [facility] ENDOSCOPY;  Service: Endoscopy;;   BIOPSY  05/23/2020   Procedure: BIOPSY;  Surgeon: Lynann Bologna, MD;  Location: [facility] ENDOSCOPY;  Service: Gastroenterology;;  EGD and COLON   BIOPSY  12/09/2020   Procedure: BIOPSY;  Surgeon: Tressia Danas, MD;  Location: [facility] ENDOSCOPY;  Service: Gastroenterology;;   biopsy on throat     hx of    CATARACT EXTRACTION Bilateral    Dr. Baker Pierini   COLONOSCOPY N/A 05/23/2020   Procedure: COLONOSCOPY;  Surgeon: Lynann Bologna, MD;  Location: [facility] ENDOSCOPY;  Service: Gastroenterology;  Laterality: N/A;   ENTEROSCOPY N/A 10/07/2020   Procedure: ENTEROSCOPY;  Surgeon: Iva Boop, MD;  Location: [facility] ENDOSCOPY;  Service: Endoscopy;  Laterality: N/A;   ESOPHAGOGASTRODUODENOSCOPY Left 04/16/2018   Procedure: ESOPHAGOGASTRODUODENOSCOPY (EGD);  Surgeon: Charna Elizabeth, MD;  Location: [facility]  ENDOSCOPY;  Service: Endoscopy;  Laterality: Left;   ESOPHAGOGASTRODUODENOSCOPY (EGD) WITH PROPOFOL N/A 05/23/2020   Procedure: ESOPHAGOGASTRODUODENOSCOPY (EGD) WITH PROPOFOL;  Surgeon: Lynann Bologna, MD;  Location: [facility] ENDOSCOPY;  Service: Gastroenterology;  Laterality: N/A;   ESOPHAGOGASTRODUODENOSCOPY (EGD) WITH PROPOFOL N/A 12/09/2020   Procedure: ESOPHAGOGASTRODUODENOSCOPY (EGD) WITH PROPOFOL;  Surgeon: Tressia Danas, MD;  Location: [facility] ENDOSCOPY;  Service: Gastroenterology;  Laterality: N/A;   EYE SURGERY     FOOT SURGERY     HOT HEMOSTASIS N/A 04/16/2018   Procedure: HOT HEMOSTASIS (ARGON PLASMA COAGULATION/BICAP);  Surgeon: Charna Elizabeth, MD;  Location: [facility] ENDOSCOPY;  Service: Endoscopy;  Laterality: N/A;   HOT  HEMOSTASIS N/A 05/23/2020   Procedure: HOT HEMOSTASIS (ARGON PLASMA COAGULATION/BICAP);  Surgeon: Lynann Bologna, MD;  Location: [facility] ENDOSCOPY;  Service: Gastroenterology;  Laterality: N/A;   HOT HEMOSTASIS N/A 12/09/2020   Procedure: HOT HEMOSTASIS (ARGON PLASMA COAGULATION/BICAP);  Surgeon: Tressia Danas, MD;  Location: [facility] ENDOSCOPY;  Service: Gastroenterology;  Laterality: N/A;   LYMPHADENECTOMY Bilateral 02/27/2014   Procedure: BILATERAL LYMPHADENECTOMY;  Surgeon: Sebastian Ache, MD;  Location: [facility] ORS;  Service: Urology;  Laterality: Bilateral;   POLYPECTOMY  05/23/2020   Procedure: POLYPECTOMY;  Surgeon: Lynann Bologna, MD;  Location: [facility] ENDOSCOPY;  Service: Gastroenterology;;   PROSTATE BIOPSY  12/2013   Gleason 4+3=7, volume 31.31 cc   ROBOT ASSISTED LAPAROSCOPIC RADICAL PROSTATECTOMY N/A 02/27/2014   Procedure: ROBOTIC ASSISTED LAPAROSCOPIC RADICAL PROSTATECTOMY WITH INDOCYANINE GREEN DYE;  Surgeon: Sebastian Ache, MD;  Location: [facility] OR[facility];  Service: Urology;  Laterality: N/A;    Social:  Lives in senior facility  Occupation: previously worked in [employer], retired  Support: nephew Casimiro Needle) Level of Function: unable to cook for himself, does not drive; uses walker to ambulate; has an aide assist him in his facility everyday usually PCP: Dr. 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   OBJECTIVE:   Physical Exam: Blood pressure (!) 198/99, pulse 91, temperature 97.9 F (36.6 C), temperature source Oral, resp. rate (!) 23, SpO2 100 %.   Constitutional: chronically  ill-appearing male laying in bed, in no acute distress HENT: normocephalic atraumatic, mucous membranes moist Eyes: conjunctiva non-erythematous, PERRL Neck: supple Cardiovascular: regular rate and rhythm, no m/r/g Pulmonary/Chest: normal work of breathing on room air, lungs clear to auscultation bilaterally Abdominal: soft, non-tender, non-distended MSK: normal bulk and tone Neurological: alert & oriented x 3, 5/5 strength in bilateral upper and lower extremities, significant dysarthria and dysfluency with word finding difficulty Skin: warm and dry Psych: normal mood and affect  Labs: CBC    Component Value Date/Time   WBC 4.4 08/13/2022 1128   RBC 3.10 (L) 08/13/2022 1128   HGB 9.9 (L) 08/13/2022 1221   HGB 8.5 (L) 11/06/2020 1031   HCT 29.0 (L) 08/13/2022 1221   HCT 25.7 (L) 11/06/2020 1031   PLT 156 08/13/2022 1128   PLT 151 11/06/2020 1031   MCV 92.3 08/13/2022 1128   MCV 91 11/06/2020 1031   MCH 30.6 08/13/2022 1128   MCHC 33.2 08/13/2022 1128   RDW 12.3 08/13/2022 1128   RDW 15.1 11/06/2020 1031  LYMPHSABS 0.9 08/13/2022 1128   MONOABS 0.7 08/13/2022 1128   EOSABS 0.1 08/13/2022 1128   BASOSABS 0.0 08/13/2022 1128     CMP     Component Value Date/Time   NA 141 08/13/2022 1221   NA 141 12/14/2021 0932   K 3.4 (L) 08/13/2022 1221   CL 108 08/13/2022 1221   CO2 21 (L) 08/13/2022 1128   GLUCOSE 130 (H) 08/13/2022 1221   BUN 23 08/13/2022 1221   BUN 24 12/14/2021 0932   CREATININE 2.40 (H) 08/13/2022 1221   CREATININE 1.52 (H) 08/28/2020 1427   CALCIUM 8.8 (L) 08/13/2022 1128   PROT 7.6 08/13/2022 1128   PROT 7.5 12/04/2021 1014   ALBUMIN 2.9 (L) 08/13/2022 1128   ALBUMIN 3.5 (L) 12/04/2021 1014   AST 22 08/13/2022 1128   ALT 16 08/13/2022 1128   ALKPHOS 55 08/13/2022 1128   BILITOT 0.5 08/13/2022 1128   BILITOT <0.2 12/04/2021 1014   GFRNONAA 30 (L) 08/13/2022 1128   GFRNONAA 45 (L) 08/28/2020 1427   GFRAA 52 (L) 08/28/2020 1427    Imaging: CT  head:  No acute intracranial abnormalities.   EKG: personally reviewed my interpretation is normal sinus rhythm with left axis deviation, T wave inversions noted in lead I, aVL (similar to prior EKG in 10/2020).   ASSESSMENT & PLAN:   Assessment & Plan by Problem: Principal Problem:   Severe hypertension   Icker Swigert. is a 77 y.o. 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 CT head with no acute intracranial abnormalities, but the patient does have evidence of end-organ damage with an AKI on CKD3b. He received PO and IV hydralazine, IV labetalol, and PO amlodipine and losartan in the ED, in an attempt to decrease his BP, and had a reduction of his BP to 174/95 by 1700 (MAP 107). With initial MAP being 165, goal MAP reduction is 20% in the first hour and 15% over the next 23 hours, thus 24 hour MAP goal is 112. Will not start any additional antihypertensives at this time given MAP is already below goal for this 24 hour period. - Restart home losartan tomorrow, likely will need additional antihypertensives at discharge - Continue telemetry monitoring  - Tylenol PRN for headache  AKI on CKD 3b Cr elevated to 2.26, was 1.95 last August and 1.3-1.6 in December 2022. BUN/Cr ratio is low at 8.8, indicating an intrinsic etiology of AKI. UA with >300 protein, no nitrites, no leukocytes, and no ketones.  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 - Restart atorvastatin 40 mg daily  Anemia of chronic disease Hb low at 9.5, was ~8 over one year ago. Suspect anemia is secondary to chronic kidney disease.  - Follow up iron, TIBC, and ferritin - Repeat CBC in the AM  Seizure disorder Previously prescribed keppra 500 mg bid, however, the patient has not picked up this medication since August, thus, I suspect he has been out of it since November (had 90d supply at that time).  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 Will hold off on restarting keppra at this time.   Type 2 diabetes A1c 7.1% in August. Not on any medications at this time.  - Repeat A1c - CBGs with meals    Diet: Heart Healthy VTE: Enoxaparin, renally dosed IVF: None Code: Full  Prior to Admission Living Arrangement:  Senior living facility Anticipated Discharge Location:  Pending Barriers to Discharge: Improvement in BP, medical stability  Dispo: Admit patient to Observation with expected length of stay less than 2 midnights.  Signed: Chauncey Mann DO Internal Medicine Resident PGY-2  08/13/2022, 4:41 PM

## 2022-08-13 NOTE — ED Notes (Signed)
ED TO INPATIENT HANDOFF REPORT  ED Nurse Name and Phone #: [phone]  S Name/Age/Gender Gregory Nunez. 77 y.o. male Room/Bed: 033C/033C  Code Status   Code Status: Full Code  Home/SNF/Other Home Patient oriented to: self, place, time, and situation Is this baseline? Yes   Triage Complete: Triage complete  Chief Complaint Severe hypertension [I10]  Triage Note Pt came in via POV from [facility] d/t HA & neck pain for the past 3 days. A/Ox4, rates pain 7/10 while in triage. Does report falling 3 weeks ago & not usually having difficulty speaking. Pt noticed to stutter while speaking, HTN noted as well.   Allergies Allergies  Allergen Reactions   Penicillins Other (See Comments)    Pt does not remember reaction but states he woke up in the hospital after taking      Level of Care/Admitting Diagnosis ED Disposition     ED Disposition  Admit   Condition  --   Comment  Hospital Area: [facility] [[id number]]  Level of Care: Telemetry Medical [104]  May place patient in observation at [facility] if equivalent level of care is available:: No  Covid Evaluation: Asymptomatic - no recent exposure (last 10 days) testing not required  Diagnosis: Severe hypertension [723625]  Admitting Physician: TONYA, CARLILE [[id number]]  Attending Physician: Tyson Alias [[id number]]          B Medical/Surgery History Past Medical History:  Diagnosis Date   Acute metabolic encephalopathy 10/22/2020   Acute metabolic encephalopathy 10/22/2020   Alcohol use 04/23/2020   Alcohol withdrawal seizure with complication, with unspecified complication (HCC) 11/08/2018   CAO (chronic airflow obstruction) (HCC)    Cataract    OD   CVA (cerebral vascular accident) (HCC)    Depression    Diabetes mellitus without complication (HCC)    Diabetic retinopathy (HCC)    NPDR OU   GERD (gastroesophageal reflux disease)    if drinks alcohol   GI bleed  10/05/2020   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   Patient is approximately 5-week status post bunion correction left foot   Hyperlipidemia    Hypertension    Hypertensive retinopathy    OU   Hypoglycemia 10/22/2020   Malignant neoplasm of prostate (HCC) 01/09/2014   Neuropathy    Pancreatitis    Pneumococcal vaccination administered at current visit 11/10/2020   Prostate cancer (HCC) 12/19/2013   Gleason 4+3=7, volume 31.31 cc   Rhabdomyolysis 04/12/2021   Sciatica    Shortness of breath dyspnea    with exertion    Past Surgical History:  Procedure Laterality Date   BIOPSY  04/16/2018   Procedure: BIOPSY;  Surgeon: Charna Elizabeth, MD;  Location: [facility];  Service: Endoscopy;;   BIOPSY  05/23/2020   Procedure: BIOPSY;  Surgeon: Lynann Bologna, MD;  Location: [facility];  Service: Gastroenterology;;  EGD and COLON   BIOPSY  12/09/2020   Procedure: BIOPSY;  Surgeon: Tressia Danas, MD;  Location: [facility] ENDOSCOPY;  Service: Gastroenterology;;   biopsy on throat     hx of    CATARACT EXTRACTION Bilateral    Dr. Baker Pierini   COLONOSCOPY N/A 05/23/2020   Procedure: COLONOSCOPY;  Surgeon: Lynann Bologna, MD;  Location: [facility]OS[facility];  Service: Gastroenterology;  Laterality: N/A;   ENTEROSCOPY N/A 10/07/2020   Procedure: ENTEROSCOPY;  Surgeon: Iva Boop, MD;  Location[facility] ENDOSCOPY;  Service: Endoscopy;  Laterality: N/A;   ESOPHAGOGASTRODUODENOSCOPY Left 04/16/2018   Procedure: ESOPHAGOGASTRODUODENOSCOPY (  EGD);  Surgeon: Charna Elizabeth, MD;  Location: [facility];  Service: Endoscopy;  Laterality: Left;   ESOPHAGOGASTRODUODENOSCOPY (EGD) WITH PROPOFOL N/A 05/23/2020   Procedure: ESOPHAGOGASTRODUODENOSCOPY (EGD) WITH PROPOFOL;  Surgeon: Lynann Bologna, MD;  Location: [facility];  Service: Gastroenterology;  Laterality: N/A;   ESOPHAGOGASTRODUODENOSCOPY (EGD) WITH PROPOFOL N/A 12/09/2020   Procedure: ESOPHAGOGASTRODUODENOSCOPY (EGD) WITH PROPOFOL;  Surgeon: Tressia Danas, MD;   Location: [facility] ENDOSCOPY;  Service: Gastroenterology;  Laterality: N/A;   EYE SURGERY     FOOT SURGERY     HOT HEMOSTASIS N/A 04/16/2018   Procedure: HOT HEMOSTASIS (ARGON PLASMA COAGULATION/BICAP);  Surgeon: Charna Elizabeth, MD;  Location: [facility] ENDOSCOPY;  Service: Endoscopy;  Laterality: N/A;   HOT HEMOSTASIS N/A 05/23/2020   Procedure: HOT HEMOSTASIS (ARGON PLASMA COAGULATION/BICAP);  Surgeon: Lynann Bologna, MD;  Location: [facility] ENDOSCOPY;  Service: Gastroenterology;  Laterality: N/A;   HOT HEMOSTASIS N/A 12/09/2020   Procedure: HOT HEMOSTASIS (ARGON PLASMA COAGULATION/BICAP);  Surgeon: Tressia Danas, MD;  Location: [facility] ENDOSCOPY;  Service: Gastroenterology;  Laterality: N/A;   LYMPHADENECTOMY Bilateral 02/27/2014   Procedure: BILATERAL LYMPHADENECTOMY;  Surgeon: Sebastian Ache, MD;  Location: [facility];  Service: Urology;  Laterality: Bilateral;   POLYPECTOMY  05/23/2020   Procedure: POLYPECTOMY;  Surgeon: Lynann Bologna, MD;  Location: [facility] ENDOS[facility];  Service: Gastroenterology;;   PROSTATE BIOPSY  12/2013   Gleason 4+3=7, volume 31.31 cc   ROBOT ASSISTED LAPAROSCOPIC RADICAL PROSTATECTOMY N/A 02/27/2014   Procedure: ROBOTIC ASSISTED LAPAROSCOPIC RADICAL PROSTATECTOMY WITH INDOCYANINE GREEN DYE;  Surgeon: Sebastian Ache, MD;  Location: [facility];  Service: Urology;  Laterality: N/A;     A IV Location/Drains/Wounds Patient Lines/Drains/Airways Status     Active Line/Drains/Airways     Name Placement date Placement time Site Days   Peripheral IV 08/13/22 20 G Right Antecubital 08/13/22  1137  Antecubital  less than 1            Intake/Output Last 24 hours No intake or output data in the 24 hours ending 08/13/22 1727  Labs/Imaging Results for orders placed or performed during the hospital encounter of 08/13/22 (from the past 48 hour(s))  POC CBG, ED     Status: Abnormal   Collection Time: 08/13/22 11:18 AM  Result Value Ref Range   Glucose-Capillary 126 (H) 70 - 99 mg/dL    Comment: Glucose  reference range applies only to samples taken after fasting for at least 8 hours.  Protime-INR     Status: None   Collection Time: 08/13/22 11:28 AM  Result Value Ref Range   Prothrombin Time 13.9 11.4 - 15.2 seconds   INR 1.1 0.8 - 1.2    Comment: (NOTE) INR goal varies based on device and disease states. Performed at [facility], [address], [place], [place] [address]   APTT     Status: None   Collection Time: 08/13/22 11:28 AM  Result Value Ref Range   aPTT 34 24 - 36 seconds    Comment: Performed at [facility], [address] [place], [place] [address]  CBC     Status: Abnormal   Collection Time: 08/13/22 11:28 AM  Result Value Ref Range   WBC 4.4 4.0 - 10.5 K/uL   RBC 3.10 (L) 4.22 - 5.81 MIL/uL   Hemoglobin 9.5 (L) 13.0 - 17.0 g/dL   HCT 82.9 (L) 56.2 - 13.0 %   MCV 92.3 80.0 - 100.0 fL   MCH 30.6 26.0 - 34.0 pg   MCHC 33.2 30.0 - 36.0  g/dL   RDW 16.1 09.6 - 04.5 %   Platelets 156 150 - 400 K/uL   nRBC 0.0 0.0 - 0.2 %    Comment: Performed at [facility], [address], [place], [place] [address]  Differential     Status: None   Collection Time: 08/13/22 11:28 AM  Result Value Ref Range   Neutrophils Relative % 60 %   Neutro Abs 2.6 1.7 - 7.7 K/uL   Lymphocytes Relative 21 %   Lymphs Abs 0.9 0.7 - 4.0 K/uL   Monocytes Relative 16 %   Monocytes Absolute 0.7 0.1 - 1.0 K/uL   Eosinophils Relative 2 %   Eosinophils Absolute 0.1 0.0 - 0.5 K/uL   Basophils Relative 0 %   Basophils Absolute 0.0 0.0 - 0.1 K/uL   Immature Granulocytes 1 %   Abs Immature Granulocytes 0.02 0.00 - 0.07 K/uL    Comment: Performed at [facility], [address] [place], [place] [address]  Comprehensive metabolic panel     Status: Abnormal   Collection Time: 08/13/22 11:28 AM  Result Value Ref Range   Sodium 137 135 - 145 mmol/L   Potassium 3.3 (L) 3.5 - 5.1 mmol/L   Chloride 109 98 - 111 mmol/L   CO2 21 (L) 22 - 32 mmol/L   Glucose, Bld 134 (H) 70 - 99  mg/dL    Comment: Glucose reference range applies only to samples taken after fasting for at least 8 hours.   BUN 20 8 - 23 mg/dL   Creatinine, Ser 8.29 (H) 0.61 - 1.24 mg/dL   Calcium 8.8 (L) 8.9 - 10.3 mg/dL   Total Protein 7.6 6.5 - 8.1 g/dL   Albumin 2.9 (L) 3.5 - 5.0 g/dL   AST 22 15 - 41 U/L   ALT 16 0 - 44 U/L   Alkaline Phosphatase 55 38 - 126 U/L   Total Bilirubin 0.5 0.3 - 1.2 mg/dL   GFR, Estimated 30 (L) >60 mL/min    Comment: (NOTE) Calculated using the CKD-EPI Creatinine Equation (2021)    Anion gap 7 5 - 15    Comment: Performed at [facility], [address], [place], [place] [address]  I-stat chem 8, ED     Status: Abnormal   Collection Time: 08/13/22 12:21 PM  Result Value Ref Range   Sodium 141 135 - 145 mmol/L   Potassium 3.4 (L) 3.5 - 5.1 mmol/L   Chloride 108 98 - 111 mmol/L   BUN 23 8 - 23 mg/dL   Creatinine, Ser 0.86 (H) 0.61 - 1.24 mg/dL   Glucose, Bld 578 (H) 70 - 99 mg/dL    Comment: Glucose reference range applies only to samples taken after fasting for at least 8 hours.   Calcium, Ion 1.16 1.15 - 1.40 mmol/L   TCO2 25 22 - 32 mmol/L   Hemoglobin 9.9 (L) 13.0 - 17.0 g/dL   HCT 46.9 (L) 62.9 - 52.8 %  Urine rapid drug screen (hosp performed)     Status: None   Collection Time: 08/13/22  1:00 PM  Result Value Ref Range   Opiates NONE DETECTED NONE DETECTED   Cocaine NONE DETECTED NONE DETECTED   Benzodiazepines NONE DETECTED NONE DETECTED   Amphetamines NONE DETECTED NONE DETECTED   Tetrahydrocannabinol NONE DETECTED NONE DETECTED   Barbiturates NONE DETECTED NONE DETECTED    Comment: (NOTE) DRUG SCREEN FOR MEDICAL PURPOSES ONLY.  IF CONFIRMATION IS NEEDED FOR ANY PURPOSE, NOTIFY  LAB WITHIN 5 DAYS.  LOWEST DETECTABLE LIMITS FOR URINE DRUG SCREEN Drug Class                     Cutoff (ng/mL) Amphetamine and metabolites    1000 Barbiturate and metabolites    200 Benzodiazepine                 200 Opiates and metabolites         300 Cocaine and metabolites        300 THC                            50 Performed at [facility], [address] [place], [place] [address]   Urinalysis, Routine w reflex microscopic -Urine, Clean Catch     Status: Abnormal   Collection Time: 08/13/22  1:00 PM  Result Value Ref Range   Color, Urine YELLOW YELLOW   APPearance CLEAR CLEAR   Specific Gravity, Urine 1.011 1.005 - 1.030   pH 6.0 5.0 - 8.0   Glucose, UA 50 (A) NEGATIVE mg/dL   Hgb urine dipstick SMALL (A) NEGATIVE   Bilirubin Urine NEGATIVE NEGATIVE   Ketones, ur NEGATIVE NEGATIVE mg/dL   Protein, ur >=604 (A) NEGATIVE mg/dL   Nitrite NEGATIVE NEGATIVE   Leukocytes,Ua NEGATIVE NEGATIVE   RBC / HPF 0-5 0 - 5 RBC/hpf   WBC, UA 0-5 0 - 5 WBC/hpf   Bacteria, UA NONE SEEN NONE SEEN   Squamous Epithelial / HPF 0-5 0 - 5 /HPF   Mucus PRESENT     Comment: Performed at [facility], [address] [place], [place] [address]   CT Cervical Spine Wo Contrast  Result Date: 08/13/2022 CLINICAL DATA:  Headache, neck pain, and hypertension. Neck pain for 2 days. Patient reports a fall. EXAM: CT CERVICAL SPINE WITHOUT CONTRAST TECHNIQUE: Multidetector CT imaging of the cervical spine was performed without intravenous contrast. Multiplanar CT image reconstructions were also generated. RADIATION DOSE REDUCTION: This exam was performed according to the departmental dose-optimization program which includes automated exposure control, adjustment of the mA and/or kV according to patient size and/or use of iterative reconstruction technique. COMPARISON:  04/12/2021 FINDINGS: Alignment: Straightening of usual cervical lordosis. This is likely positional or degenerative but could indicate muscle spasm. Alignment is similar to prior study. No anterior subluxations. Normal alignment of the posterior elements. Skull base and vertebrae: No acute fracture. No primary bone lesion or focal pathologic process. Soft tissues and spinal canal: No  prevertebral fluid or swelling. No visible canal hematoma. Disc levels: Degenerative changes throughout with narrowed disc spaces and endplate osteophyte formation. Degenerative changes in the facet joints. Prominent disc osteophyte complex at C3-4 causes moderate anterior effacement of the thecal sac. Uncovertebral and facet joint osteophytes cause some bone encroachment upon the neural foramina bilaterally. 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 Other: None. IMPRESSION: 1. Nonspecific straightening of usual cervical lordosis. No acute displaced fractures. Diffuse degenerative changes. Electronically Signed   By: Burman Nieves M.D.   On: 08/13/2022 15:47   CT Head Wo Contrast  Result Date: 08/13/2022 CLINICAL DATA:  Headache, neck pain, and hypertension. Neck pain for 2 days. Headache for several days. Patient reports a fall. EXAM: CT HEAD WITHOUT CONTRAST TECHNIQUE: Contiguous axial images were obtained from the base of the skull through the vertex without intravenous contrast. RADIATION DOSE REDUCTION: This exam was performed according to  the departmental dose-optimization program which includes automated exposure control, adjustment of the mA and/or kV according to patient size and/or use of iterative reconstruction technique. COMPARISON:  MRI brain 04/13/2021.  CT head 04/12/2021 FINDINGS: Brain: Diffuse cerebral atrophy. Ventricular dilatation consistent with central atrophy. Low-attenuation changes in the deep white matter consistent with small vessel ischemia. Confluent low-attenuation changes demonstrated in the left insular region and in the left midbrain consistent with old infarcts. No change in appearance since prior study. Probable old cerebellar infarcts. No abnormal extra-axial fluid collections. No mass effect or midline shift. Gray-white matter junctions are distinct. Basal cisterns are not effaced. No acute intracranial hemorrhage. Vascular: No hyperdense vessel or  unexpected calcification. Skull: Normal. Negative for fracture or focal lesion. Sinuses/Orbits: No acute finding. Other: None. IMPRESSION: No acute intracranial abnormalities. Chronic atrophy and small vessel ischemic changes. 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 Electronically Signed   By: Burman Nieves M.D.   On: 08/13/2022 15:41    Pending Labs Unresulted Labs (From admission, onward)     Start     Ordered   08/14/22 0500  Basic metabolic panel  Tomorrow morning,   R        08/13/22 1636   08/14/22 0500  CBC  Tomorrow morning,   R        08/13/22 1636   08/13/22 1111  Ethanol  Once,   URGENT        08/13/22 1111            Vitals/Pain Today's Vitals   08/13/22 1500 08/13/22 1515 08/13/22 1545 08/13/22 1600  BP: (!) 165/102 (!) 173/121  (!) 198/99  Pulse: 89 98 94 91  Resp: 17 16 19  (!) 23  Temp:      TempSrc:      SpO2: 100% 100% 100% 100%  PainSc:        Isolation Precautions No active isolations  Medications Medications  enoxaparin (LOVENOX) injection 30 mg (has no administration in time range)  acetaminophen (TYLENOL) tablet 650 mg (has no administration in time range)    Or  acetaminophen (TYLENOL) suppository 650 mg (has no administration in time range)  senna-docusate (Senokot-S) tablet 1 tablet (has no administration in time range)  labetalol (NORMODYNE) injection 20 mg (20 mg Intravenous Given 08/13/22 1139)  hydrALAZINE (APRESOLINE) tablet 25 mg (25 mg Oral Given 08/13/22 1216)  hydrALAZINE (APRESOLINE) tablet 50 mg (50 mg Oral Given 08/13/22 1417)  amLODipine (NORVASC) tablet 5 mg (5 mg Oral Given 08/13/22 1617)  losartan (COZAAR) tablet 50 mg (50 mg Oral Given 08/13/22 1617)  hydrALAZINE (APRESOLINE) injection 10 mg (10 mg Intravenous Given 08/13/22 1618)    Mobility walks with device     Focused Assessments Cardiac Assessment Handoff:  Cardiac Rhythm: Normal sinus rhythm Lab Results  Component Value Date   CKTOTAL 515 (H)  04/15/2021   TROPONINI <0.03 04/16/2018   Lab Results  Component Value Date   DDIMER 0.38 02/18/2015   Does the Patient currently have chest pain? No    R Recommendations: See Admitting Provider Note  Report given to:   Additional Notes: Patient will be transported the as soon the bed is marked clean/ready. In the main time, please call me if you have any question.

## 2022-08-13 NOTE — ED Provider Notes (Signed)
[facility]    CSN: [id number] Arrival date & time: 08/13/22  4098      History   Chief Complaint Chief Complaint  Patient presents with   Neck Pain   Headache    HPI Gregory Rivadeneira Robyn Nohr. is a 77 y.o. male.   I had difficulty obtaining history given patient's speech issue but it appears that he is also a very poor historian.  Patient presented with headache and neck pain.  Although, he reported to the nurse that he had a headache but denies to me that his head hurts.  He reports that the pain is present on the right upper neck.  He denies any obvious injury to the area.  Denies history of chronic neck pain.  Patient has not taken any medication for his pain.  Denies dizziness, blurred vision, chest pain, shortness of breath.  Patient does report that he had a fall approximately 3 weeks ago but did not hit his head.  He states that he fell just simply by tripping.  Patient continues to talk intermittently about his seizures but he is difficult to understand.  It seems that he has not had any recent seizures per his report.  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  Although, he was recently discharged due to not attending.  Patient states that he is not currently followed by speech-language pathology but reports that his speech impediment has gotten more severe over the past few months.  Review of the chart reports that he is followed by family medicine and neurology but patient states that he does not see family medicine or neurology and does not take any daily medications whatsoever.  Blood pressure is significantly elevated but he denies that he takes any daily blood pressure medication.   Neck Pain Headache   Past Medical History:  Diagnosis Date   Acute metabolic encephalopathy 10/22/2020   Acute metabolic encephalopathy 10/22/2020   Alcohol use 04/23/2020   Alcohol  withdrawal seizure with complication, with unspecified complication (HCC) 11/08/2018   CAO (chronic airflow obstruction) (HCC)    Cataract    OD   CVA (cerebral vascular accident) (HCC)    Depression    Diabetes mellitus without complication (HCC)    Diabetic retinopathy (HCC)    NPDR OU   GERD (gastroesophageal reflux disease)    if drinks alcohol   GI bleed 10/05/2020   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   Patient is approximately 5-week status post bunion correction left foot   Hyperlipidemia    Hypertension    Hypertensive retinopathy    OU   Hypoglycemia 10/22/2020   Malignant neoplasm of prostate (HCC) 01/09/2014   Neuropathy    Pancreatitis    Pneumococcal vaccination administered at current visit 11/10/2020   Prostate cancer (HCC) 12/19/2013   Gleason 4+3=7, volume 31.31 cc   Rhabdomyolysis 04/12/2021   Sciatica    Shortness of breath dyspnea    with exertion     Patient Active Problem List   Diagnosis Date Noted   Hepatitis C test positive 12/04/2021   Abscess of chest wall 12/04/2021   History of epistaxis 12/04/2021   Debility 04/15/2021   History of alcohol abuse 04/13/2021   Diabetic hypoglycemia (HCC) 04/12/2021   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/12/2021   Weakness of both legs    Adenomatous duodenal polyp    AVM (arteriovenous malformation) of duodenum, acquired  with hemorrhage    Angiodysplasia of intestine    Iron deficiency anemia 05/22/2020   Constipation 05/22/2020   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 (HCC)    History of CVA (cerebrovascular accident) 04/23/2020   Hyperlipidemia associated with type 2 diabetes mellitus (HCC) 04/23/2020   CKD (chronic kidney disease) stage 3, GFR 30-59 ml/min (HCC) 04/23/2020   Tobacco use 04/23/2020   Seizure disorder (HCC) 11/08/2018   Hypertension associated with diabetes (HCC) 11/08/2018   Type 2 diabetes mellitus without complication (HCC) 11/08/2018   Prostate cancer  (HCC) 02/27/2014   Bunion 01/17/2013   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    Past Surgical History:  Procedure Laterality Date   BIOPSY  04/16/2018   Procedure: BIOPSY;  Surgeon: Charna Elizabeth, MD;  Location: [facility];  Service: Endoscopy;;   BIOPSY  05/23/2020   Procedure: BIOPSY;  Surgeon: Lynann Bologna, MD;  Location: [facility];  Service: Gastroenterology;;  EGD and COLON   BIOPSY  12/09/2020   Procedure: BIOPSY;  Surgeon: Tressia Danas, MD;  Location: [facility] ENDOSCOPY;  Service: Gastroenterology;;   biopsy on throat     hx of    CATARACT EXTRACTION Bilateral    Dr. Baker Pierini   COLONOSCOPY N/A 05/23/2020   Procedure: COLONOSCOPY;  Surgeon: Lynann Bologna, MD;  Location: [facility];  Service: Gastroenterology;  Laterality: N/A;   ENTEROSCOPY N/A 10/07/2020   Procedure: ENTEROSCOPY;  Surgeon: Iva Boop, MD;  Location: [facility] ENDOSCOPY;  Service: Endoscopy;  Laterality: N/A;   ESOPHAGOGASTRODUODENOSCOPY Left 04/16/2018   Procedure: ESOPHAGOGASTRODUODENOSCOPY (EGD);  Surgeon: Charna Elizabeth, MD;  Location: [facility];  Service: Endoscopy;  Laterality: Left;   ESOPHAGOGASTRODUODENOSCOPY (EGD) WITH PROPOFOL N/A 05/23/2020   Procedure: ESOPHAGOGASTRODUODENOSCOPY (EGD) WITH PROPOFOL;  Surgeon: Lynann Bologna, MD;  Location: [facility];  Service: Gastroenterology;  Laterality: N/A;   ESOPHAGOGASTRODUODENOSCOPY (EGD) WITH PROPOFOL N/A 12/09/2020   Procedure: ESOPHAGOGASTRODUODENOSCOPY (EGD) WITH PROPOFOL;  Surgeon: Tressia Danas, MD;  Location: [facility] ENDOSCOPY;  Service: Gastroenterology;  Laterality: N/A;   EYE SURGERY     FOOT SURGERY     HOT HEMOSTASIS N/A 04/16/2018   Procedure: HOT HEMOSTASIS (ARGON PLASMA COAGULATION/BICAP);  Surgeon: Charna Elizabeth, MD;  Location: [facility];  Service: Endoscopy;  Laterality: N/A;   HOT HEMOSTASIS N/A 05/23/2020   Procedure: HOT HEMOSTASIS (ARGON PLASMA COAGULATION/BICAP);  Surgeon: Lynann Bologna, MD;  Location: [facility];  Service:  Gastroenterology;  Laterality: N/A;   HOT HEMOSTASIS N/A 12/09/2020   Procedure: HOT HEMOSTASIS (ARGON PLASMA COAGULATION/BICAP);  Surgeon: Tressia Danas, MD;  Location: [facility] ENDOSCOPY;  Service: Gastroenterology;  Laterality: N/A;   LYMPHADENECTOMY Bilateral 02/27/2014   Procedure: BILATERAL LYMPHADENECTOMY;  Surgeon: Sebastian Ache, MD;  Location: [facility];  Service: Urology;  Laterality: Bilateral;   POLYPECTOMY  05/23/2020   Procedure: POLYPECTOMY;  Surgeon: Lynann Bologna, MD;  Location: [facility];  Service: Gastroenterology;;   PROSTATE BIOPSY  12/2013   Gleason 4+3=7, volume 31.31 cc   ROBOT ASSISTED LAPAROSCOPIC RADICAL PROSTATECTOMY N/A 02/27/2014   Procedure: ROBOTIC ASSISTED LAPAROSCOPIC RADICAL PROSTATECTOMY WITH INDOCYANINE GREEN DYE;  Surgeon: Sebastian Ache, MD;  Location: [facility];  Service: Urology;  Laterality: N/A;       Home Medications    Prior to Admission medications   Medication Sig Start Date End Date Taking? Authorizing Provider  aspirin EC 81 MG tablet Take 81 mg by mouth daily. Swallow whole.    [provider]  atorvastatin (LIPITOR) 40 MG tablet Take 1 tablet (40 mg total) by mouth daily.  12/04/21   Champ Mungo, DO  levETIRAcetam (KEPPRA) 500 MG tablet Take 1 tablet (500 mg total) by mouth 2 (two) times daily. 12/04/21   Champ Mungo, DO  losartan (COZAAR) 50 MG tablet Take 1 tablet (50 mg total) by mouth daily. 12/04/21   Champ Mungo, DO  mupirocin ointment (BACTROBAN) 2 % Apply 1 Application topically 2 (two) times daily. To affected area till better 11/29/21   Zenia Resides, MD  sodium chloride (OCEAN) 0.65 % SOLN nasal spray Place 1 spray into both nostrils as needed for congestion. 12/04/21   Champ Mungo, DO  COMBIVENT RESPIMAT 20-100 MCG/ACT AERS respimat Inhale 1 puff into the lungs every 6 (six) hours as needed for shortness of breath. 06/12/18 08/19/19  [provider]  mometasone-formoterol (DULERA) 100-5 MCG/ACT AERO Inhale 2 puffs into  the lungs daily. Patient not taking: Reported on 08/19/2019 04/17/18 08/19/19  Berton Mount I, MD  sucralfate (CARAFATE) 1 g tablet Take 1 tablet (1 g total) by mouth 4 (four) times daily. Patient not taking: Reported on 08/19/2019 04/17/18 08/19/19  Berton Mount I, MD  tiotropium (SPIRIVA HANDIHALER) 18 MCG inhalation capsule Place 1 capsule (18 mcg total) into inhaler and inhale daily. Patient not taking: Reported on 08/19/2019 04/17/18 08/19/19  Berton Mount I, MD  traZODone (DESYREL) 50 MG tablet Take 50 mg by mouth at bedtime as needed for sleep. Patient not taking: Reported on 10/06/2020  10/08/20  [provider]    Family History Family History  Problem Relation Age of Onset   Heart disease Mother    Heart attack Father 15   Cancer Sister        breast   Colon cancer Neg Hx    Esophageal cancer Neg Hx    Rectal cancer Neg Hx    Stomach cancer Neg Hx     Social History Social History   Tobacco Use   Smoking status: Every Day    Packs/day: .5    Types: Cigarettes   Smokeless tobacco: Never   Tobacco comments:    1 ppd Wants Patches .  Stopped 1.5 weeks ago   Vaping Use   Vaping Use: Never used  Substance Use Topics   Alcohol use: Yes    Alcohol/week: 1.0 standard drink of alcohol    Types: 1 Cans of beer per week   Drug use: Not Currently    Types: Marijuana, Cocaine    Comment: past hx approx 30 years ago      Allergies   Penicillins   Review of Systems Review of Systems Per HPI  Physical Exam Triage Vital Signs ED Triage Vitals  Enc Vitals Group     BP 08/13/22 1024 (!) 204/95     Pulse Rate 08/13/22 1024 93     Resp 08/13/22 1024 18     Temp 08/13/22 1024 98.1 F (36.7 C)     Temp Source 08/13/22 1024 Oral     SpO2 08/13/22 1024 98 %     Weight --      Height --      Head Circumference --      Peak Flow --      Pain Score 08/13/22 1026 10     Pain Loc --      Pain Edu? --      Excl. in GC? --    No data found.  Updated  Vital Signs BP (!) 204/95 (BP Location: Left Arm) Comment: 217/115  Pulse 93   Temp  98.1 F (36.7 C) (Oral)   Resp 18   SpO2 98%   Visual Acuity Right Eye Distance:   Left Eye Distance:   Bilateral Distance:    Right Eye Near:   Left Eye Near:    Bilateral Near:     Physical Exam Constitutional:      General: He is not in acute distress.    Appearance: Normal appearance. He is not toxic-appearing or diaphoretic.  HENT:     Head: Normocephalic and atraumatic.  Eyes:     Extraocular Movements: Extraocular movements intact.     Conjunctiva/sclera: Conjunctivae normal.     Pupils: Pupils are equal, round, and reactive to light.  Neck:      Comments: Patient reports tenderness to palpation to right lateral upper neck area.  No obvious swelling or discoloration noted.  No direct spinal tenderness, crepitus, step-off noted.  Patient has his neck in a forward position and has limited range of motion of neck due to pain. Cardiovascular:     Rate and Rhythm: Normal rate and regular rhythm.     Pulses: Normal pulses.     Heart sounds: Normal heart sounds.  Pulmonary:     Effort: Pulmonary effort is normal. No respiratory distress.     Breath sounds: Normal breath sounds.  Neurological:     General: No focal deficit present.     Mental Status: He is alert and oriented to person, place, and time. Mental status is at baseline.     Cranial Nerves: Cranial nerves 2-12 are intact.     Sensory: Sensation is intact.     Motor: Motor function is intact.     Coordination: Coordination is intact.     Comments: Aphasia noted.  Limited neuro evaluation given patient cooperation.  Otherwise seems neurologically intact.  Psychiatric:        Mood and Affect: Mood normal.        Behavior: Behavior normal.        Thought Content: Thought content normal.        Judgment: Judgment normal.      UC Treatments / Results  Labs (all labs ordered are listed, but only abnormal results are  displayed) Labs Reviewed - No data to display  EKG   Radiology No results found.  Procedures Procedures (including critical care time)  Medications Ordered in UC Medications - No data to display  Initial Impression / Assessment and Plan / UC Course  I have reviewed the triage vital signs and the nursing notes.  Pertinent labs & imaging results that were available during my care of the patient were reviewed by me and considered in my medical decision making (see chart for details).     I had a lot of difficulty obtaining the patient's history given aphasia.  Patient continues to focus on his seizures but I cannot ascertain what he is concerned about with the seizures.  As far as I can tell, he has not had any recent seizures.  Although, I am very concerned with how elevated his blood pressure is and that he is not taking any blood pressure medication.  Patient's neck/head pain could be related to muscle strain but given how significant blood pressure is elevated, I am concerned it could be related.  Patient's aphasia also appears to be significant but I am not sure patient's baseline.  Patient also reports that this has been worsening over the past few months.  Therefore, patient was advised to go to the  ER for further evaluation and management given limited resources here in urgent care.  Patient was agreeable with plan.  Neuroexam appears baseline for patient so do not think that ambulance transportation is necessary but I did suggest ambulance transportation given blood pressure.  Patient declined and wishes self transport.  Advised him to go straight to the emergency department. Final Clinical Impressions(s) / UC Diagnoses   Final diagnoses:  Elevated blood pressure reading  Neck pain     Discharge Instructions      Please go straight to the emergency department as soon as you leave urgent care for further evaluation and management.    ED Prescriptions   None    PDMP not  reviewed this encounter.   Gustavus Bryant, [place] 08/13/22 1051

## 2022-08-13 NOTE — ED Provider Notes (Signed)
[place] EMERGENCY DEPARTMENT AT [facility] Provider Note   CSN: [id number] Arrival date & time: 08/13/22  1049     History  Chief Complaint  Patient presents with   Hypertension   Neck Pain   Headache    Gregory Sanden Charly Holcomb. is a 77 y.o. male.  77 year old male with a history of alcohol abuse in remission, CVA with residual speech deficits, seizures, diabetes, hypertension who presents to the emergency department with neck pain.  Patient reports 2 weeks ago he had a fall.  Says that his neck has been hurting him but it started 1 week after that.  Went to urgent care and was noted to be significantly hypertensive and was brought into the emergency department for further evaluation.  Patient states that he has not had any speech changes recently.  No new weakness or numbness of his arms or legs.  Does have mild headache on the right side.  Not on blood thinners.  Has not used alcohol or other recreational drugs in over a month.  Says he is not taking any of his antihypertensives at home.       Home Medications Prior to Admission medications   Medication Sig Start Date End Date Taking? Authorizing Provider  aspirin EC 81 MG tablet Take 81 mg by mouth daily. Swallow whole.    [provider]  atorvastatin (LIPITOR) 40 MG tablet Take 1 tablet (40 mg total) by mouth daily. 12/04/21   Champ Mungo, DO  levETIRAcetam (KEPPRA) 500 MG tablet Take 1 tablet (500 mg total) by mouth 2 (two) times daily. 12/04/21   Champ Mungo, DO  losartan (COZAAR) 50 MG tablet Take 1 tablet (50 mg total) by mouth daily. 12/04/21   Champ Mungo, DO  mupirocin ointment (BACTROBAN) 2 % Apply 1 Application topically 2 (two) times daily. To affected area till better 11/29/21   Zenia Resides, MD  sodium chloride (OCEAN) 0.65 % SOLN nasal spray Place 1 spray into both nostrils as needed for congestion. 12/04/21   Champ Mungo, DO  COMBIVENT RESPIMAT 20-100 MCG/ACT AERS respimat Inhale 1 puff into  the lungs every 6 (six) hours as needed for shortness of breath. 06/12/18 08/19/19  [provider]  mometasone-formoterol (DULERA) 100-5 MCG/ACT AERO Inhale 2 puffs into the lungs daily. Patient not taking: Reported on 08/19/2019 04/17/18 08/19/19  Berton Mount I, MD  sucralfate (CARAFATE) 1 g tablet Take 1 tablet (1 g total) by mouth 4 (four) times daily. Patient not taking: Reported on 08/19/2019 04/17/18 08/19/19  Berton Mount I, MD  tiotropium (SPIRIVA HANDIHALER) 18 MCG inhalation capsule Place 1 capsule (18 mcg total) into inhaler and inhale daily. Patient not taking: Reported on 08/19/2019 04/17/18 08/19/19  Berton Mount I, MD  traZODone (DESYREL) 50 MG tablet Take 50 mg by mouth at bedtime as needed for sleep. Patient not taking: Reported on 10/06/2020  10/08/20  [provider]      Allergies    Penicillins    Review of Systems   Review of Systems  Physical Exam Updated Vital Signs BP (!) 198/99   Pulse 91   Temp 97.9 F (36.6 C) (Oral)   Resp (!) 23   SpO2 100%  Physical Exam Vitals and nursing note reviewed.  Constitutional:      General: He is not in acute distress.    Appearance: He is well-developed.  HENT:     Head: Normocephalic and atraumatic.     Right Ear: External ear normal.  Left Ear: External ear normal.     Nose: Nose normal.  Eyes:     Extraocular Movements: Extraocular movements intact.     Conjunctiva/sclera: Conjunctivae normal.     Pupils: Pupils are equal, round, and reactive to light.  Cardiovascular:     Rate and Rhythm: Normal rate.     Heart sounds: No murmur heard. Pulmonary:     Effort: Pulmonary effort is normal. No respiratory distress.  Musculoskeletal:     Cervical back: Normal range of motion and neck supple.  Skin:    General: Skin is warm and dry.  Neurological:     Mental Status: He is alert. Mental status is at baseline.     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 V: normal sensation to light touch in V1, V2, and V3 segments bilaterally VII: no facial weakness or asymmetry, no nasolabial fold flattening VIII: normal hearing to speech and finger friction IX, X: normal palatal elevation, no uvular deviation XI: 5/5 head turn and 5/5 shoulder shrug bilaterally XII: midline tongue protrusion MOTOR: 5/5 strength in R shoulder flexion, elbow flexion and extension, and grip strength. 5/5 strength in L shoulder flexion, elbow flexion and extension, and grip strength.  5/5 strength in R hip and knee flexion, knee extension, ankle plantar and dorsiflexion. 5/5 strength in L hip and knee flexion, knee extension, ankle plantar and dorsiflexion. SENSORY: Normal sensation to light touch in all extremities STATION: No truncal ataxia   Psychiatric:        Mood and Affect: Mood normal.        Behavior: Behavior normal.     ED Results / Procedures / Treatments   Labs (all labs ordered are listed, but only abnormal results are displayed) Labs Reviewed  CBC - Abnormal; Notable for the following components:      Result Value   RBC 3.10 (*)    Hemoglobin 9.5 (*)    HCT 28.6 (*)    All other components within normal limits  COMPREHENSIVE METABOLIC PANEL - Abnormal; Notable for the following components:   Potassium 3.3 (*)    CO2 21 (*)    Glucose, Bld 134 (*)    Creatinine, Ser 2.26 (*)    Calcium 8.8 (*)    Albumin 2.9 (*)    GFR, Estimated 30 (*)    All other components within normal limits  URINALYSIS, ROUTINE W REFLEX MICROSCOPIC - Abnormal; Notable for the following components:   Glucose, UA 50 (*)    Hgb urine dipstick SMALL (*)    Protein, ur >=300 (*)    All other components within normal limits  I-STAT CHEM 8, ED - Abnormal; Notable for the following components:   Potassium 3.4 (*)     Creatinine, Ser 2.40 (*)    Glucose, Bld 130 (*)    Hemoglobin 9.9 (*)    HCT 29.0 (*)    All other components within normal limits  CBG MONITORING, ED - Abnormal; Notable for the following components:   Glucose-Capillary 126 (*)    All other components within normal limits  PROTIME-INR  APTT  DIFFERENTIAL  RAPID URINE DRUG SCREEN, HOSP PERFORMED  ETHANOL    EKG EKG Interpretation  Date/Time:  Friday August 13 2022 11:11:00 EDT Ventricular Rate:  86 PR Interval:  154 QRS  Duration: 101 QT Interval:  397 QTC Calculation: 475 R Axis:   -38 Text Interpretation: Sinus rhythm Left atrial enlargement Left axis deviation RSR' in V1 or V2, right VCD or RVH Nonspecific T abnormalities, lateral leads Confirmed by Vonita Moss [phone]) on 08/13/2022 3:09:54 PM  Radiology CT Cervical Spine Wo Contrast  Result Date: 08/13/2022 CLINICAL DATA:  Headache, neck pain, and hypertension. Neck pain for 2 days. Patient reports a fall. EXAM: CT CERVICAL SPINE WITHOUT CONTRAST TECHNIQUE: Multidetector CT imaging of the cervical spine was performed without intravenous contrast. Multiplanar CT image reconstructions were also generated. RADIATION DOSE REDUCTION: This exam was performed according to the departmental dose-optimization program which includes automated exposure control, adjustment of the mA and/or kV according to patient size and/or use of iterative reconstruction technique. COMPARISON:  04/12/2021 FINDINGS: Alignment: Straightening of usual cervical lordosis. This is likely positional or degenerative but could indicate muscle spasm. Alignment is similar to prior study. No anterior subluxations. Normal alignment of the posterior elements. Skull base and vertebrae: No acute fracture. No primary bone lesion or focal pathologic process. Soft tissues and spinal canal: No prevertebral fluid or swelling. No visible canal hematoma. Disc levels: Degenerative changes throughout with narrowed disc spaces  and endplate osteophyte formation. Degenerative changes in the facet joints. Prominent disc osteophyte complex at C3-4 causes moderate anterior effacement of the thecal sac. Uncovertebral and facet joint osteophytes cause some bone encroachment upon the neural foramina bilaterally. 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 Other: None. IMPRESSION: 1. Nonspecific straightening of usual cervical lordosis. No acute displaced fractures. Diffuse degenerative changes. Electronically Signed   By: Burman Nieves M.D.   On: 08/13/2022 15:47   CT Head Wo Contrast  Result Date: 08/13/2022 CLINICAL DATA:  Headache, neck pain, and hypertension. Neck pain for 2 days. Headache for several days. Patient reports a fall. EXAM: CT HEAD WITHOUT CONTRAST TECHNIQUE: Contiguous axial images were obtained from the base of the skull through the vertex without intravenous contrast. RADIATION DOSE REDUCTION: This exam was performed according to the departmental dose-optimization program which includes automated exposure control, adjustment of the mA and/or kV according to patient size and/or use of iterative reconstruction technique. COMPARISON:  MRI brain 04/13/2021.  CT head 04/12/2021 FINDINGS: Brain: Diffuse cerebral atrophy. Ventricular dilatation consistent with central atrophy. Low-attenuation changes in the deep white matter consistent with small vessel ischemia. Confluent low-attenuation changes demonstrated in the left insular region and in the left midbrain consistent with old infarcts. No change in appearance since prior study. Probable old cerebellar infarcts. No abnormal extra-axial fluid collections. No mass effect or midline shift. Gray-white matter junctions are distinct. Basal cisterns are not effaced. No acute intracranial hemorrhage. Vascular: No hyperdense vessel or unexpected calcification. Skull: Normal. Negative for fracture or focal lesion. Sinuses/Orbits: No acute finding. Other: None.  IMPRESSION: No acute intracranial abnormalities. Chronic atrophy and small vessel ischemic changes. 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 Electronically Signed   By: Burman Nieves M.D.   On: 08/13/2022 15:41    Procedures Procedures   Medications Ordered in ED Medications  enoxaparin (LOVENOX) injection 30 mg (has no administration in time range)  acetaminophen (TYLENOL) tablet 650 mg (has no administration in time range)    Or  acetaminophen (TYLENOL) suppository 650 mg (has no administration in time range)  senna-docusate (Senokot-S) tablet 1 tablet (has no administration in time range)  labetalol (NORMODYNE) injection 20 mg (20 mg Intravenous Given 08/13/22 1139)  hydrALAZINE (APRESOLINE) tablet  25 mg (25 mg Oral Given 08/13/22 1216)  hydrALAZINE (APRESOLINE) tablet 50 mg (50 mg Oral Given 08/13/22 1417)  amLODipine (NORVASC) tablet 5 mg (5 mg Oral Given 08/13/22 1617)  losartan (COZAAR) tablet 50 mg (50 mg Oral Given 08/13/22 1617)  hydrALAZINE (APRESOLINE) injection 10 mg (10 mg Intravenous Given 08/13/22 1618)    ED Course/ Medical Decision Making/ A&P Clinical Course as of 08/13/22 1639  Fri Aug 13, 2022  1212 Hemoglobin(!): 9.5 Baseline of 8 [RP]  1555 Signed out to Dr Lockie Mola.  [RP]    Clinical Course User Index [RP] Rondel Baton, MD                             Medical Decision Making Amount and/or Complexity of Data Reviewed Labs: ordered. Decision-making details documented in ED Course. Radiology: ordered.  Risk Prescription drug management. Decision regarding hospitalization.   Gregory Pennisi Rilyn Upshaw. is a 77 y.o. male with comorbidities that complicate the patient evaluation including alcohol abuse in remission, CVA with residual speech deficits, seizures, diabetes, and hypertension who presents emergency department neck pain  Initial Ddx:  ICH, C-spine injury, stroke, press, hypertensive emergency  MDM:  With the patient's fall  feel that he could potentially have an ICH or C-spine injury.  Also was markedly hypertensive which predisposes him to spontaneous brain bleeds.  Will obtain a CT of the head at this time.  Will also obtain a CT of the C-spine with his recent fall.  No obvious deficits at this time that would suggest a stroke.  Does not appear to be significantly altered that would suggest press.  No other symptoms that would be suggestive of hypertensive emergency currently.  Plan:  Labs CT head and C-spine Labetalol  ED Summary/Re-evaluation:  In the emergency department patient required multiple rounds of antihypertensives.  CT of the head and C-spine did not show any acute abnormalities.  Labs showed worsening CKD without any other acute findings.  Due to his persistently elevated blood pressure despite multiple medications he was ordered for clevidipine drip and will be admitted to the hospital.  Signed out to Dr. Lockie Mola awaiting callback from medicine.  This patient presents to the ED for concern of complaints listed in HPI, this involves an extensive number of treatment options, and is a complaint that carries with it a high risk of complications and morbidity. Disposition including potential need for admission considered.   Dispo: Pending remainder of workup  Records reviewed Outpatient Clinic Notes The following labs were independently interpreted: Chemistry and show CKD I independently reviewed the following imaging with scope of interpretation limited to determining acute life threatening conditions related to emergency care: CT Head and agree with the radiologist interpretation with the following exceptions: none I personally reviewed and interpreted cardiac monitoring: normal sinus rhythm  I personally reviewed and interpreted the pt's EKG: see above for interpretation  I have reviewed the patients home medications and made adjustments as needed Social Determinants of health:  Elderly  Final  Clinical Impression(s) / ED Diagnoses Final diagnoses:  Bad headache  Resistant hypertension    Rx / DC Orders ED Discharge Orders     None         Rondel Baton, MD 08/13/22 [phone]

## 2022-08-14 DIAGNOSIS — I129 Hypertensive chronic kidney disease with stage 1 through stage 4 chronic kidney disease, or unspecified chronic kidney disease: Secondary | ICD-10-CM | POA: Diagnosis not present

## 2022-08-14 DIAGNOSIS — N179 Acute kidney failure, unspecified: Secondary | ICD-10-CM

## 2022-08-14 DIAGNOSIS — F17210 Nicotine dependence, cigarettes, uncomplicated: Secondary | ICD-10-CM

## 2022-08-14 DIAGNOSIS — N1832 Chronic kidney disease, stage 3b: Secondary | ICD-10-CM

## 2022-08-14 LAB — IRON AND TIBC
Iron: 39 ug/dL — ABNORMAL LOW (ref 45–182)
Saturation Ratios: 10 % — ABNORMAL LOW (ref 17.9–39.5)
TIBC: 377 ug/dL (ref 250–450)
UIBC: 338 ug/dL

## 2022-08-14 LAB — GLUCOSE, CAPILLARY
Glucose-Capillary: 103 mg/dL — ABNORMAL HIGH (ref 70–99)
Glucose-Capillary: 106 mg/dL — ABNORMAL HIGH (ref 70–99)
Glucose-Capillary: 171 mg/dL — ABNORMAL HIGH (ref 70–99)
Glucose-Capillary: 127 mg/dL — ABNORMAL HIGH (ref 70–99)
Glucose-Capillary: 114 mg/dL — ABNORMAL HIGH (ref 70–99)

## 2022-08-14 LAB — CBC
HCT: 26.9 % — ABNORMAL LOW (ref 39.0–52.0)
Hemoglobin: 9.3 g/dL — ABNORMAL LOW (ref 13.0–17.0)
MCH: 30.9 pg (ref 26.0–34.0)
MCHC: 34.6 g/dL (ref 30.0–36.0)
MCV: 89.4 fL (ref 80.0–100.0)
Platelets: 164 10*3/uL (ref 150–400)
RBC: 3.01 MIL/uL — ABNORMAL LOW (ref 4.22–5.81)
RDW: 12.2 % (ref 11.5–15.5)
WBC: 5.3 10*3/uL (ref 4.0–10.5)
nRBC: 0 % (ref 0.0–0.2)

## 2022-08-14 LAB — BASIC METABOLIC PANEL
Anion gap: 9 (ref 5–15)
BUN: 25 mg/dL — ABNORMAL HIGH (ref 8–23)
CO2: 22 mmol/L (ref 22–32)
Calcium: 8.9 mg/dL (ref 8.9–10.3)
Chloride: 106 mmol/L (ref 98–111)
Creatinine, Ser: 2.39 mg/dL — ABNORMAL HIGH (ref 0.61–1.24)
GFR, Estimated: 28 mL/min — ABNORMAL LOW (ref >60)
Glucose, Bld: 105 mg/dL — ABNORMAL HIGH (ref 70–99)
Potassium: 3 mmol/L — ABNORMAL LOW (ref 3.5–5.1)
Sodium: 137 mmol/L (ref 135–145)

## 2022-08-14 LAB — HEPATITIS B SURFACE ANTIBODY,QUALITATIVE: Hep B S Ab: NONREACTIVE

## 2022-08-14 LAB — HEMOGLOBIN A1C
Hgb A1c MFr Bld: 5.8 % — ABNORMAL HIGH (ref 4.8–5.6)
Mean Plasma Glucose: 119.76 mg/dL

## 2022-08-14 LAB — HEPATITIS C ANTIBODY: HCV Ab: REACTIVE — AB

## 2022-08-14 LAB — LIPID PANEL
Cholesterol: 160 mg/dL (ref 0–200)
HDL: 51 mg/dL (ref >40)
LDL Cholesterol: 92 mg/dL (ref 0–99)
Total CHOL/HDL Ratio: 3.1 RATIO
Triglycerides: 84 mg/dL (ref <150)
VLDL: 17 mg/dL (ref 0–40)

## 2022-08-14 LAB — FERRITIN: Ferritin: 15 ng/mL — ABNORMAL LOW (ref 24–336)

## 2022-08-14 MED ORDER — SODIUM CHLORIDE 0.9 % IV SOLN
250.0000 mg | Freq: Once | INTRAVENOUS | Status: AC
  Administered 2022-08-14: 250 mg via INTRAVENOUS
  Filled 2022-08-14: qty 20

## 2022-08-14 MED ORDER — AMLODIPINE BESYLATE 5 MG PO TABS
5.0000 mg | ORAL_TABLET | Freq: Every day | ORAL | Status: DC
  Administered 2022-08-15 – 2022-08-16 (×2): 5 mg via ORAL
  Filled 2022-08-14 (×2): qty 1

## 2022-08-14 MED ORDER — POLYSACCHARIDE IRON COMPLEX 150 MG PO CAPS
150.0000 mg | ORAL_CAPSULE | Freq: Every day | ORAL | Status: DC
  Administered 2022-08-15 – 2022-08-16 (×2): 150 mg via ORAL
  Filled 2022-08-14 (×2): qty 1

## 2022-08-14 MED ORDER — AMLODIPINE BESYLATE 5 MG PO TABS
5.0000 mg | ORAL_TABLET | Freq: Once | ORAL | Status: AC
  Administered 2022-08-14: 5 mg via ORAL
  Filled 2022-08-14: qty 1

## 2022-08-14 MED ORDER — HYDRALAZINE HCL 25 MG PO TABS
25.0000 mg | ORAL_TABLET | Freq: Three times a day (TID) | ORAL | Status: DC
  Administered 2022-08-14 (×2): 25 mg via ORAL
  Filled 2022-08-14 (×2): qty 1

## 2022-08-14 MED ORDER — SODIUM CHLORIDE 0.9 % IV SOLN: 250.0000 mg | Freq: Every day | INTRAVENOUS | Status: DC

## 2022-08-14 MED ORDER — LOSARTAN POTASSIUM 50 MG PO TABS
50.0000 mg | ORAL_TABLET | Freq: Every day | ORAL | Status: DC
  Administered 2022-08-15 – 2022-08-16 (×2): 50 mg via ORAL
  Filled 2022-08-14 (×2): qty 1

## 2022-08-14 MED ORDER — POTASSIUM CHLORIDE CRYS ER 20 MEQ PO TBCR
30.0000 meq | EXTENDED_RELEASE_TABLET | Freq: Once | ORAL | Status: AC
  Administered 2022-08-14: 30 meq via ORAL
  Filled 2022-08-14: qty 1

## 2022-08-14 NOTE — Progress Notes (Addendum)
Subjective:   Patient's headache has resolved.  No new complaints at this time.  Discussed that we will update his nephew today regarding his condition.  Objective:  Vital signs in last 24 hours: Vitals:   08/13/22 1815 08/13/22 1900 08/13/22 2051 08/13/22 2353  BP:  (!) 159/69 (!) 161/87 (!) 164/86  Pulse: 98 (!) 106 (!) 105 (!) 101  Resp: 17 18 18    Temp:  97.8 F (36.6 C) 99.1 F (37.3 C) 98.9 F (37.2 C)  TempSrc:  Oral Oral Oral  SpO2: 100% 100% 100% 100%   Physical Exam: Constitutional: resting in bed, not in acute distress Cardiovascular: regular rate and rhythm, no m/r/g Pulmonary: normal work of breathing on RA, no wheezes or crackles Abdominal: soft, non-tender, non-distended MSK: moving all extremities Neurological: AxO x3, dysarthria, difficulty word-finding Skin: warm and dry  Assessment/Plan:  Principal Problem:   Severe hypertension  Gregory Nunez. is a 77 y.o. person living with a history of  hypertension, seizure disorder, previous cerebral infarction in 2022 with residual speech impediment who presented with headache and admitted for severe, symptomatic hypertension.    # Severe, symptomatic hypertension Presented w/ headache. Found to be significantly hypertensive w/ MAP in the 160s. CT head negative for abnormalities. Has signs of end-organ dysfunction w/ AKI on CKD3b. Received PO/IV hydralazine, IV labetalol, and PO amlodipine/losartan in the ED. BP this morning improved w/ MAP in the 110s.  24 hour MAP goal is 112 (15% reduction).  Will treat with amlodipine and hydralazine at this time.  Given significant proteinuria and normal BP during clinic visit in 11/2021, there is concern for renal disease as a cause of his severe hypertension.  Consulted nephrology.  Pending serologies for nephrotic syndrome.  Hep C antibody was positive in 11/2021.  Patient was referred to ID, but has not yet seen them. -Appreciate nephrology recommendations - Hold home  losartan for AKI - Tylenol PRN  - Trend BP - Telemetry - Amlodipine 5 mg daily - Hydralazine 25 mg TID - Pending ANA, ANCA, C3/C4, SPEP, UPEP, light chains, hep B/hep C   # AKI on CKD 3b Baseline Cr ~ 1.3-1.6. Cr elevated at 2.26 on admission. BUN/Cr ratio suggests intrinsic etiology. UA w/ > 300 protein. Urine protein/creatinine ratio elevated at 7.3. Renal US normal.  Consulted nephrology given clinical get proteinuria.  Pending serologies. 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 - Atorvastatin 40 mg daily   # Anemia of chronic disease Hgb stable at 9.5.  Iron/saturation ratio are low.  Ferritin also low at 15. Likely in the setting of CKD. Iron deficit 805 mg. -Start iron supplementation at discharge - Trend CBC   # Seizure disorder Seizures previously attributed to alcohol withdrawal. Patient reports no longer drinking alcohol. Previously prescribed keppra 500 mg BID. Has not picked up since 11/2021. No signs of seizures at this time. Will hold off on keppra.    # Type 2 diabetes Currently diet controlled. A1c 5.8% here. - CBGs with meals    Diet: HH Bowel: senna VTE: lovenox IVF: none Code: full PT/OT recs: none LOS: day 1    Prior to Admission Living Arrangement:  Senior living facility Anticipated Discharge Location:  Pending Barriers to Discharge: Improvement in BP, medical stability Dispo: Anticipated discharge in approximately more than 2 day(s).   Karoline Caldwell, MD 08/14/2022, 6:22 AM Pager: [phone] After 5pm on  weekdays and 1pm on weekends: On Call pager [phone]

## 2022-08-14 NOTE — Consult Note (Signed)
[facility] Renal Consultation Note  Requesting MD: Erlinda Hong, MD Indication for Consultation:  CKD and proteinuria   Chief complaint: headache   HPI:  Gregory Fridman. is a 77 y.o. 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  He was found to be severely hypertensive in the range of 204/95 and 230/136.  He previously was on only losartan 50 mg daily.  The headache improved with lowering his blood pressure.  The team has added hydralazine and amlodipine to his regimen and resumed his home losartan 50 mg daily. Note that he has been out of his medication for several weeks.  His creatinine is at his recent baseline of 2-2.5. There is a gap in data from 03/2021 to 11/2021 but this does appear to be consistent with August 2023 labs.  The patient is not married, has no children, his parents are deceased, has 53 brothers, and identifies his nephew as the person who would help him with medical decisions.  I called his nephew on the phone.  The patient states that he has been out of medication for several months.  His nephew wants to make sure that he has whatever he needs.  The patient would like to go home and get clothes or for someone to bring him something.  When he gets excited trying to talk is difficult.  His headache is resolved.  No shortness of breath.    Creatinine, Ser  Date/Time Value Ref Range Status  08/14/2022 06:10 AM 2.39 (H) 0.61 - 1.24 mg/dL Final  09/81/1914 78:29 PM 2.40 (H) 0.61 - 1.24 mg/dL Final  56/21/3086 57:84 AM 2.26 (H) 0.61 - 1.24 mg/dL Final  69/62/9528 41:32 AM 1.95 (H) 0.76 - 1.27 mg/dL Final  44/04/270 53:66 AM 2.51 (H) 0.76 - 1.27 mg/dL Final  44/06/4740 59:56 AM 1.34 (H) 0.61 - 1.24 mg/dL Final  38/75/6433 29:51 AM 1.65  (H) 0.61 - 1.24 mg/dL Final  88/41/6606 30:16 AM 1.59 (H) 0.61 - 1.24 mg/dL Final  04/27/3233 57:32 PM 1.55 (H) 0.61 - 1.24 mg/dL Final  20/25/4270 62:37 AM 1.55 (H) 0.61 - 1.24 mg/dL Final  62/83/1517 61:60 PM 1.68 (H) 0.61 - 1.24 mg/dL Final  73/71/0626 94:85 AM 1.60 (H) 0.61 - 1.24 mg/dL Final  46/27/0350 09:38 PM 1.52 (H) 0.61 - 1.24 mg/dL Final  18/29/9371 69:67 AM 1.39 (H) 0.61 - 1.24 mg/dL Final  89/38/1017 51:02 AM 1.59 (H) 0.61 - 1.24 mg/dL Final  58/52/7782 42:35 AM 1.91 (H) 0.61 - 1.24 mg/dL Final  36/14/4315 40:08 AM 1.64 (H) 0.61 - 1.24 mg/dL Final  67/61/9509 32:67 PM 1.68 (H) 0.61 - 1.24 mg/dL Final  12/45/8099 83:38 AM 1.53 (H) 0.61 - 1.24 mg/dL Final  25/08/3974 73:41 AM 1.73 (H) 0.61 - 1.24 mg/dL Final  93/79/0240 97:35 AM 1.51 (H) 0.61 - 1.24 mg/dL Final  32/99/2426 83:41 PM 1.51 (H) 0.61 - 1.24 mg/dL Final  96/22/2979 89:21 PM 1.20 0.61 - 1.24 mg/dL Final  19/41/7408 14:48 PM 1.29 (H) 0.61 - 1.24 mg/dL Final  18/56/3149 70:26 AM 1.35 (H) 0.61 - 1.24 mg/dL Final  37/85/8850 27:74 AM 1.35 (H) 0.61 - 1.24 mg/dL Final  12/87/8676 72:09 AM 1.31 (H) 0.61 - 1.24 mg/dL Final  47/12/6281 66:29 AM 1.46 (H) 0.61 - 1.24 mg/dL Final  47/65/4650 35:46 AM 1.68 (H) 0.61 -  1.24 mg/dL Final  16/01/9603 54:09 PM 1.78 (H) 0.61 - 1.24 mg/dL Final  81/19/1478 29:56 AM 1.40 (H) 0.61 - 1.24 mg/dL Final  21/30/8657 84:69 AM 1.42 (H) 0.61 - 1.24 mg/dL Final  62/95/2841 32:44 AM 1.59 (H) 0.61 - 1.24 mg/dL Final  04/21/7251 66:44 AM 1.35 (H) 0.61 - 1.24 mg/dL Final  03/47/4259 56:38 AM 1.41 (H) 0.61 - 1.24 mg/dL Final  75/64/3329 51:88 AM 1.46 (H) 0.61 - 1.24 mg/dL Final  41/66/0630 16:01 AM 1.51 (H) 0.61 - 1.24 mg/dL Final  09/32/3557 32:20 AM 1.66 (H) 0.61 - 1.24 mg/dL Final  25/42/7062 37:62 PM 2.20 (H) 0.61 - 1.24 mg/dL Final  83/15/1761 60:73 PM 2.35 (H) 0.61 - 1.24 mg/dL Final  71/09/2692 85:46 AM 1.11 0.61 - 1.24 mg/dL Final  27/06/5007 38:18 AM 1.05 0.61 - 1.24 mg/dL Final   29/93/7169 67:89 PM 1.19 0.61 - 1.24 mg/dL Final  38/01/1750 02:58 AM 0.91 0.61 - 1.24 mg/dL Final  52/77/8242 35:36 AM 1.03 0.61 - 1.24 mg/dL Final  14/43/1540 08:67 AM 0.98 0.61 - 1.24 mg/dL Final  61/95/0932 67:12 PM 1.00 0.61 - 1.24 mg/dL Final  45/80/9983 38:25 PM 1.02 0.61 - 1.24 mg/dL Final  05/39/7673 41:93 AM 0.93 0.61 - 1.24 mg/dL Final  79/05/4095 35:32 AM 0.93 0.61 - 1.24 mg/dL Final  99/24/2683 41:96 PM 0.84 0.61 - 1.24 mg/dL Final  22/29/7989 21:19 PM 0.80 0.50 - 1.35 mg/dL Final  41/74/0814 48:18 PM 0.90 0.50 - 1.35 mg/dL Final     PMHx:   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   Patient is approximately 5-week status post bunion correction left foot   Hyperlipidemia    Hypertension    Hypertensive retinopathy    OU   Hypoglycemia 10/22/2020   Malignant neoplasm of prostate (HCC) 01/09/2014   Neuropathy    Pancreatitis    Pneumococcal vaccination administered at current visit 11/10/2020   Prostate cancer (HCC) 12/19/2013   Gleason 4+3=7, volume 31.31 cc   Rhabdomyolysis 04/12/2021   Sciatica    Shortness of breath dyspnea    with exertion     Past Surgical History:  Procedure Laterality Date   BIOPSY  04/16/2018   Procedure: BIOPSY;  Surgeon: Charna Elizabeth, MD;  Location: [facility];  Service: Endoscopy;;   BIOPSY  05/23/2020   Procedure: BIOPSY;  Surgeon: Lynann Bologna, MD;  Location: [facility];  Service: Gastroenterology;;   EGD and COLON   BIOPSY  12/09/2020   Procedure: BIOPSY;  Surgeon: Tressia Danas, MD;  Location[facility] ENDOSCOPY;  Service: Gastroenterology;;   biopsy on throat     hx of    CATARACT EXTRACTION Bilateral    Dr. Baker Pierini   COLONOSCOPY N/A 05/23/2020   Procedure: COLONOSCOPY;  Surgeon: Lynann Bologna, MD;  Location: [facility]OSCOPY;  Service: Gastroenterology;  Laterality: N/A;   ENTEROSCOPY N/A 10/07/2020   Procedure: ENTEROSCOPY;  Surgeon: Iva Boop, MD;  Location: [facility] ENDOSCOPY;  Service: Endoscopy;  Laterality: N/A;   ESOPHAGOGASTRODUODENOSCOPY Left 04/16/2018   Procedure: ESOPHAGOGASTRODUODENOSCOPY (EGD);  Surgeon: Charna Elizabeth, MD;  Location: [facility]COPY;  Service: Endoscopy;  Laterality: Left;   ESOPHAGOGASTRODUODENOSCOPY (EGD) WITH PROPOFOL N/A 05/23/2020   Procedure: ESOPHAGOGASTRODUODENOSCOPY (EGD) WITH PROPOFOL;  Surgeon: Lynann Bologna, MD;  Location: [facility];  Service: Gastroenterology;  Laterality: N/A;   ESOPHAGOGASTRODUODENOSCOPY (EGD) WITH PROPOFOL N/A 12/09/2020   Procedure: ESOPHAGOGASTRODUODENOSCOPY (EGD) WITH PROPOFOL;  Surgeon: Tressia Danas, MD;  Location: [facility]COPY;  Service: Gastroenterology;  Laterality: N/A;   EYE SURGERY     FOOT SURGERY     HOT HEMOSTASIS N/A 04/16/2018   Procedure: HOT HEMOSTASIS (ARGON PLASMA COAGULATION/BICAP);  Surgeon: Charna Elizabeth, MD;  Location: [facility];  Service: Endoscopy;  Laterality: N/A;   HOT HEMOSTASIS N/A 05/23/2020   Procedure: HOT HEMOSTASIS (ARGON PLASMA COAGULATION/BICAP);  Surgeon: Lynann Bologna, MD;  Location: [facility];  Service: Gastroenterology;  Laterality: N/A;   HOT HEMOSTASIS N/A 12/09/2020   Procedure: HOT HEMOSTASIS (ARGON PLASMA COAGULATION/BICAP);  Surgeon: Tressia Danas, MD;  Location: [facility] ENDOSCOPY;  Service: Gastroenterology;  Laterality: N/A;   LYMPHADENECTOMY Bilateral 02/27/2014   Procedure: BILATERAL LYMPHADENECTOMY;  Surgeon: Sebastian Ache, MD;  Location: [facility];  Service: Urology;   Laterality: Bilateral;   POLYPECTOMY  05/23/2020   Procedure: POLYPECTOMY;  Surgeon: Lynann Bologna, MD;  Location: [facility];  Service: Gastroenterology;;   PROSTATE BIOPSY  12/2013   Gleason 4+3=7, volume 31.31 cc   ROBOT ASSISTED LAPAROSCOPIC RADICAL PROSTATECTOMY N/A 02/27/2014   Procedure: ROBOTIC ASSISTED LAPAROSCOPIC RADICAL PROSTATECTOMY WITH INDOCYANINE GREEN DYE;  Surgeon: Sebastian Ache, MD;  Location: [facility];  Service: Urology;  Laterality: N/A;    Family Hx:  Family History  Problem Relation Age of Onset   Heart disease Mother    Heart attack Father 37   Cancer Sister        breast   Colon cancer Neg Hx    Esophageal cancer Neg Hx    Rectal cancer Neg Hx    Stomach cancer Neg Hx     Social History:  reports that he has been smoking cigarettes. He has been smoking an average of .5 packs per day. He has never used smokeless tobacco. He reports current alcohol use of about 1.0 standard drink of alcohol per week. He reports that he does not currently use drugs after having used the following drugs: Marijuana and Cocaine.  Allergies:  Allergies  Allergen Reactions   Penicillins Other (See Comments)    Pt does not remember reaction but states he woke up in the hospital after taking      Medications: Prior to Admission medications   Medication Sig Start Date End Date Taking? Authorizing Provider  atorvastatin (LIPITOR) 40 MG tablet Take 1 tablet (40 mg total) by mouth daily. Patient not taking: Reported on 08/13/2022 12/04/21   Champ Mungo, DO  levETIRAcetam (KEPPRA) 500 MG tablet Take 1 tablet (500 mg total) by mouth 2 (two) times daily. Patient not taking: Reported on 08/13/2022 12/04/21   Champ Mungo, DO  losartan (COZAAR) 50 MG tablet Take 1 tablet (50 mg total) by mouth daily. Patient not taking: Reported on 08/13/2022 12/04/21   Champ Mungo, DO  mupirocin ointment (BACTROBAN) 2 % Apply 1 Application topically 2 (two) times daily. To affected area till better Patient  not taking: Reported on 08/13/2022 11/29/21   Zenia Resides, MD  sodium chloride (OCEAN) 0.65 % SOLN nasal spray Place 1 spray into both nostrils as needed for  congestion. Patient not taking: Reported on 08/13/2022 12/04/21   Champ Mungo, DO  COMBIVENT RESPIMAT 20-100 MCG/ACT AERS respimat Inhale 1 puff into the lungs every 6 (six) hours as needed for shortness of breath. 06/12/18 08/19/19  [provider]  mometasone-formoterol (DULERA) 100-5 MCG/ACT AERO Inhale 2 puffs into the lungs daily. Patient not taking: Reported on 08/19/2019 04/17/18 08/19/19  Berton Mount I, MD  sucralfate (CARAFATE) 1 g tablet Take 1 tablet (1 g total) by mouth 4 (four) times daily. Patient not taking: Reported on 08/19/2019 04/17/18 08/19/19  Berton Mount I, MD  tiotropium (SPIRIVA HANDIHALER) 18 MCG inhalation capsule Place 1 capsule (18 mcg total) into inhaler and inhale daily. Patient not taking: Reported on 08/19/2019 04/17/18 08/19/19  Berton Mount I, MD  traZODone (DESYREL) 50 MG tablet Take 50 mg by mouth at bedtime as needed for sleep. Patient not taking: Reported on 10/06/2020  10/08/20  [provider]    I have reviewed the patient's current and prior to admission medications.  Labs:     Latest Ref Rng & Units 08/14/2022    6:10 AM 08/13/2022   12:21 PM 08/13/2022   11:28 AM  BMP  Glucose 70 - 99 mg/dL 161  096  045   BUN 8 - 23 mg/dL 25  23  20    Creatinine 0.61 - 1.24 mg/dL 4.09  8.11  9.14   Sodium 135 - 145 mmol/L 137  141  137   Potassium 3.5 - 5.1 mmol/L 3.0  3.4  3.3   Chloride 98 - 111 mmol/L 106  108  109   CO2 22 - 32 mmol/L 22   21   Calcium 8.9 - 10.3 mg/dL 8.9   8.8     Urinalysis    Component Value Date/Time   COLORURINE YELLOW 08/13/2022 1300   APPEARANCEUR CLEAR 08/13/2022 1300   LABSPEC 1.011 08/13/2022 1300   PHURINE 6.0 08/13/2022 1300   GLUCOSEU 50 (A) 08/13/2022 1300   HGBUR SMALL (A) 08/13/2022 1300   BILIRUBINUR NEGATIVE 08/13/2022 1300   KETONESUR  NEGATIVE 08/13/2022 1300   PROTEINUR >=300 (A) 08/13/2022 1300   UROBILINOGEN 1.0 01/10/2019 1132   NITRITE NEGATIVE 08/13/2022 1300   LEUKOCYTESUR NEGATIVE 08/13/2022 1300     ROS:  Pertinent items noted in HPI and remainder of comprehensive ROS otherwise negative.  Physical Exam: Vitals:   08/14/22 1413 08/14/22 1610  BP: (!) 157/86 (!) 144/70  Pulse:  95  Resp:  16  Temp:  97.7 F (36.5 C)  SpO2:  100%     General: adult male in bed in NAD  HEENT: NCAT Eyes: EOMI sclera anicteric Neck: supple trachea midline Heart: S1S2 no rub tachycardic Lungs: clear to auscultation bilaterally; normal work of breathing on room air  Abdomen: soft/nt/nd Extremities: no edema appreciated; no cyanosis or clubbing  Skin: no rash on extremities exposed Neuro: alert and oriented to person, year, location, and situation; dysarthria when frustrated  Psych: he is at times frustrated   Assessment/Plan:  # Hypertensive emergency - He has been out of medication  - Improved control but BP dropping to the 140's - I have stopped his hydralazine to avoid rapid lowering - Consider re-addition step-wise prior to biopsy per trends - note that a biopsy is possible but not definite  - I have resumed his losartan 50 mg daily   # 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  Up/cr ratio 7330 mg/g.  Note that most recently his A1c has been controlled which may have happened as his CKD progressed.  He has had > 300 mg/dL on his urinalyses since 2013.  Note chronic hep B.  I don't see another up/cr ratio for comparison but this does not appear to be a new finding.  Volume status is controlled - ANCA, ANA, complements, SPEP, UPEP, and free light chains were sent and pending   - Please cautiously hold aspirin pending the labs  above  - If his GN work-up above comes back normal aside from DM, I am less convinced that he would need a renal biopsy.  He has strong preferences on our initial meeting and this is not a new process - Resume RAAS blockade  - Once back on RAAS blockade and on clinic follow-up will consider addition of farxiga or jardiance  - repeat up/cr ratio to verify   # Hypokalemia - Potassium chloride 30 meq PO once   # Normocytic Anemia  - May be secondary in part to CKD - iron deficient  - Ferrlecit x 1 dose then start oral iron OTC  Thank you for the consult.  Please do not hesitate to contact me with any questions regarding our patient.   Estanislado Emms 08/14/2022, 7:17 PM

## 2022-08-14 NOTE — Evaluation (Signed)
Physical Therapy Evaluation Patient Details Name: Gregory Nunez. MRN: [id number] DOB: 04-04-1946 Today's Date: 08/14/2022  History of Present Illness  Pt is a 77 y.o. male who presented 08/13/22 with a headache and noted to be hypertensive in urgent care. Pt admitted with severe, symptomatic hypertension and AKI on CKD 3b. 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   Clinical Impression  Pt presents with condition above and deficits mentioned below, see PT Problem List. PTA, he was mod I using a cane and living alone in an 8th floor apartment. He has an elevator access to his apartment. He endorses he falls on average 1x every 2 months. Pt currently demonstrates deficits in gross strength, balance, and activity tolerance and is at risk for subsequent falls. Min guard assist was provided for safety with all functional mobility utilizing a RW today. Educated pt to use a RW at this time and try to get his nephew to stay with him and assist him initially upon d/c. He verbalized understanding. Pt could benefit from HHPT follow-up upon d/c. Will continue to follow acutely.     Recommendations for follow up therapy are one component of a multi-disciplinary discharge planning process, led by the attending physician.  Recommendations may be updated based on patient status, additional functional criteria and insurance authorization.  Follow Up Recommendations       Assistance Recommended at Discharge Intermittent Supervision/Assistance  Patient can return home with the following  A little help with walking and/or transfers;A little help with bathing/dressing/bathroom;Assistance with cooking/housework;Direct supervision/assist for financial management;Direct supervision/assist for medications management;Assist for transportation    Equipment  Recommendations Rolling walker (2 wheels)  Recommendations for Other Services  OT consult    Functional Status Assessment Patient has had a recent decline in their functional status and demonstrates the ability to make significant improvements in function in a reasonable and predictable amount of time.     Precautions / Restrictions Precautions Precautions: Fall Restrictions Weight Bearing Restrictions: No      Mobility  Bed Mobility Overal bed mobility: Needs Assistance Bed Mobility: Supine to Sit     Supine to sit: Min guard, HOB elevated     General bed mobility comments: Min guard for safety    Transfers Overall transfer level: Needs assistance Equipment used: Rolling walker (2 wheels) Transfers: Sit to/from Stand Sit to Stand: Min guard           General transfer comment: Min guard assist for safety, no LOB    Ambulation/Gait Ambulation/Gait assistance: Min guard Gait Distance (Feet): 200 Feet Assistive device: Rolling walker (2 wheels) Gait Pattern/deviations: Step-through pattern, Decreased stride length, Trunk flexed Gait velocity: reduced Gait velocity interpretation: <1.8 ft/sec, indicate of risk for recurrent falls   General Gait Details: Pt takes slow steps with step-through gait pattern. No LOB but intermittent knee flexion noted in stance. Min guard for Wellsite geologist    Modified Rankin (Stroke Patients Only)       Balance Overall balance assessment: Needs assistance Sitting-balance support: No upper extremity supported, Feet supported Sitting balance-Leahy Scale: Good     Standing balance support: Bilateral upper extremity supported, During functional activity, Reliant on assistive device for balance Standing balance-Leahy Scale: Poor Standing balance comment: Reliant on RW to ambulate  Pertinent Vitals/Pain Pain Assessment Pain Assessment: Faces Faces Pain  Scale: Hurts little more Pain Location: neck with L rotation Pain Descriptors / Indicators: Discomfort, Grimacing, Guarding Pain Intervention(s): Monitored during session, Limited activity within patient's tolerance, Repositioned    Home Living Family/patient expects to be discharged to:: Private residence Living Arrangements: Alone Available Help at Discharge: Family;Available 24 hours/day (per pt, his nephew can assist but does work but only works "when he wants to") Type of Home: Apartment Home Access: Elevator       Home Layout: One level Home Equipment: Shower seat;Grab bars - tub/shower;Cane - single point;BSC/3in1      Prior Function Prior Level of Function : Independent/Modified Independent;History of Falls (last six months)             Mobility Comments: uses cane, reports he falls on average 1x every 2 months ADLs Comments: Does not drive, nephew drives him to get groceries/food; does not cook, warms up frozen meals or picks up meals     Hand Dominance        Extremity/Trunk Assessment   Upper Extremity Assessment Upper Extremity Assessment: Defer to OT evaluation    Lower Extremity Assessment Lower Extremity Assessment: Generalized weakness (fairly symmetrically weak; denied numbness/tingling)    Cervical / Trunk Assessment Cervical / Trunk Assessment: Normal  Communication   Communication: Expressive difficulties (residual speech issues from prior CVA 2022)  Cognition Arousal/Alertness: Awake/alert Behavior During Therapy: WFL for tasks assessed/performed Overall Cognitive Status: No family/caregiver present to determine baseline cognitive functioning                                 General Comments: Pt not always following questions, needing further explanation. Slow to process info. Unsure if this is baseline or not.        General Comments General comments (skin integrity, edema, etc.): BP 133/95 (108) supine, 149/96 (113) sitting,  168/93 (118) sitting after ambulating; encouraged pt to ask nephew to stay with him initially upon d/c    Exercises     Assessment/Plan    PT Assessment Patient needs continued PT services  PT Problem List Decreased strength;Decreased activity tolerance;Decreased balance;Decreased mobility;Decreased cognition       PT Treatment Interventions DME instruction;Gait training;Functional mobility training;Therapeutic activities;Therapeutic exercise;Balance training;Neuromuscular re-education;Cognitive remediation;Patient/family education    PT Goals (Current goals can be found in the Care Plan section)  Acute Rehab PT Goals Patient Stated Goal: to improve PT Goal Formulation: With patient Time For Goal Achievement: 08/28/22 Potential to Achieve Goals: Good    Frequency Min 3X/week     Co-evaluation               AM-PAC PT "6 Clicks" Mobility  Outcome Measure Help needed turning from your back to your side while in a flat bed without using bedrails?: None Help needed moving from lying on your back to sitting on the side of a flat bed without using bedrails?: A Little Help needed moving to and from a bed to a chair (including a wheelchair)?: A Little Help needed standing up from a chair using your arms (e.g., wheelchair or bedside chair)?: A Little Help needed to walk in hospital room?: A Little Help needed climbing 3-5 steps with a railing? : A Little 6 Click Score: 19    End of Session Equipment Utilized During Treatment: Gait belt Activity Tolerance: Patient tolerated treatment well Patient left: in chair;with call bell/phone within reach;with  chair alarm set Nurse Communication: Mobility status;Other (comment) (BP) PT Visit Diagnosis: Unsteadiness on feet (R26.81);Other abnormalities of gait and mobility (R26.89);Muscle weakness (generalized) (M62.81);History of falling (Z91.81);Difficulty in walking, not elsewhere classified (R26.2)    Time: 1110-1140 PT Time  Calculation (min) (ACUTE ONLY): 30 min   Charges:   PT Evaluation $PT Eval Moderate Complexity: 1 Mod PT Treatments $Therapeutic Activity: 8-22 mins        Raymond Gurney, PT, DPT Acute Rehabilitation Services  Office: [phone]   Jewel Baize 08/14/2022, 1:42 PM

## 2022-08-14 NOTE — Plan of Care (Signed)
  Problem: Education: Goal: Knowledge of General Education information will improve Description: Including pain rating scale, medication(s)/side effects and non-pharmacologic comfort measures Outcome: Progressing   Problem: Clinical Measurements: Goal: Respiratory complications will improve Outcome: Progressing   Problem: Activity: Goal: Risk for activity intolerance will decrease Outcome: Progressing   Problem: Nutrition: Goal: Adequate nutrition will be maintained Outcome: Progressing   Problem: Coping: Goal: Level of anxiety will decrease Outcome: Progressing   Problem: Skin Integrity: Goal: Risk for impaired skin integrity will decrease Outcome: Progressing   

## 2022-08-15 DIAGNOSIS — N1832 Chronic kidney disease, stage 3b: Secondary | ICD-10-CM | POA: Diagnosis not present

## 2022-08-15 DIAGNOSIS — Z79899 Other long term (current) drug therapy: Secondary | ICD-10-CM | POA: Diagnosis not present

## 2022-08-15 DIAGNOSIS — D509 Iron deficiency anemia, unspecified: Secondary | ICD-10-CM | POA: Diagnosis present

## 2022-08-15 DIAGNOSIS — E876 Hypokalemia: Secondary | ICD-10-CM | POA: Diagnosis present

## 2022-08-15 DIAGNOSIS — E1142 Type 2 diabetes mellitus with diabetic polyneuropathy: Secondary | ICD-10-CM | POA: Diagnosis present

## 2022-08-15 DIAGNOSIS — H35039 Hypertensive retinopathy, unspecified eye: Secondary | ICD-10-CM | POA: Diagnosis present

## 2022-08-15 DIAGNOSIS — B182 Chronic viral hepatitis C: Secondary | ICD-10-CM | POA: Diagnosis present

## 2022-08-15 DIAGNOSIS — E113293 Type 2 diabetes mellitus with mild nonproliferative diabetic retinopathy without macular edema, bilateral: Secondary | ICD-10-CM | POA: Diagnosis present

## 2022-08-15 DIAGNOSIS — Z8546 Personal history of malignant neoplasm of prostate: Secondary | ICD-10-CM | POA: Diagnosis not present

## 2022-08-15 DIAGNOSIS — R808 Other proteinuria: Secondary | ICD-10-CM | POA: Diagnosis not present

## 2022-08-15 DIAGNOSIS — I69320 Aphasia following cerebral infarction: Secondary | ICD-10-CM | POA: Diagnosis not present

## 2022-08-15 DIAGNOSIS — F17210 Nicotine dependence, cigarettes, uncomplicated: Secondary | ICD-10-CM | POA: Diagnosis present

## 2022-08-15 DIAGNOSIS — G40909 Epilepsy, unspecified, not intractable, without status epilepticus: Secondary | ICD-10-CM | POA: Diagnosis present

## 2022-08-15 DIAGNOSIS — F32A Depression, unspecified: Secondary | ICD-10-CM | POA: Diagnosis present

## 2022-08-15 DIAGNOSIS — Z8249 Family history of ischemic heart disease and other diseases of the circulatory system: Secondary | ICD-10-CM | POA: Diagnosis not present

## 2022-08-15 DIAGNOSIS — I69390 Apraxia following cerebral infarction: Secondary | ICD-10-CM | POA: Diagnosis not present

## 2022-08-15 DIAGNOSIS — E1122 Type 2 diabetes mellitus with diabetic chronic kidney disease: Secondary | ICD-10-CM | POA: Diagnosis present

## 2022-08-15 DIAGNOSIS — N179 Acute kidney failure, unspecified: Secondary | ICD-10-CM | POA: Diagnosis not present

## 2022-08-15 DIAGNOSIS — R519 Headache, unspecified: Secondary | ICD-10-CM | POA: Diagnosis present

## 2022-08-15 DIAGNOSIS — I129 Hypertensive chronic kidney disease with stage 1 through stage 4 chronic kidney disease, or unspecified chronic kidney disease: Secondary | ICD-10-CM | POA: Diagnosis not present

## 2022-08-15 DIAGNOSIS — B181 Chronic viral hepatitis B without delta-agent: Secondary | ICD-10-CM | POA: Diagnosis present

## 2022-08-15 DIAGNOSIS — I161 Hypertensive emergency: Secondary | ICD-10-CM | POA: Diagnosis present

## 2022-08-15 DIAGNOSIS — J449 Chronic obstructive pulmonary disease, unspecified: Secondary | ICD-10-CM | POA: Diagnosis present

## 2022-08-15 DIAGNOSIS — D631 Anemia in chronic kidney disease: Secondary | ICD-10-CM | POA: Diagnosis present

## 2022-08-15 DIAGNOSIS — I1A0 Resistant hypertension: Secondary | ICD-10-CM | POA: Diagnosis present

## 2022-08-15 DIAGNOSIS — E785 Hyperlipidemia, unspecified: Secondary | ICD-10-CM | POA: Diagnosis present

## 2022-08-15 LAB — PROTEIN / CREATININE RATIO, URINE
Creatinine, Urine: 65 mg/dL
Protein Creatinine Ratio: 3.94 mg/mg{Cre} — ABNORMAL HIGH (ref 0.00–0.15)
Total Protein, Urine: 256 mg/dL

## 2022-08-15 LAB — BASIC METABOLIC PANEL
Anion gap: 7 (ref 5–15)
BUN: 36 mg/dL — ABNORMAL HIGH (ref 8–23)
CO2: 22 mmol/L (ref 22–32)
Calcium: 8.4 mg/dL — ABNORMAL LOW (ref 8.9–10.3)
Chloride: 106 mmol/L (ref 98–111)
Creatinine, Ser: 2.63 mg/dL — ABNORMAL HIGH (ref 0.61–1.24)
GFR, Estimated: 25 mL/min — ABNORMAL LOW (ref >60)
Glucose, Bld: 97 mg/dL (ref 70–99)
Potassium: 3.2 mmol/L — ABNORMAL LOW (ref 3.5–5.1)
Sodium: 135 mmol/L (ref 135–145)

## 2022-08-15 LAB — GLUCOSE, CAPILLARY
Glucose-Capillary: 98 mg/dL (ref 70–99)
Glucose-Capillary: 92 mg/dL (ref 70–99)
Glucose-Capillary: 109 mg/dL — ABNORMAL HIGH (ref 70–99)
Glucose-Capillary: 124 mg/dL — ABNORMAL HIGH (ref 70–99)

## 2022-08-15 LAB — C3 COMPLEMENT: C3 Complement: 167 mg/dL (ref 82–167)

## 2022-08-15 LAB — C4 COMPLEMENT: Complement C4, Body Fluid: 41 mg/dL — ABNORMAL HIGH (ref 12–38)

## 2022-08-15 MED ORDER — POTASSIUM CHLORIDE CRYS ER 20 MEQ PO TBCR
40.0000 meq | EXTENDED_RELEASE_TABLET | ORAL | Status: AC
  Administered 2022-08-15 (×2): 40 meq via ORAL
  Filled 2022-08-15 (×2): qty 2

## 2022-08-15 MED ORDER — HYDRALAZINE HCL 25 MG PO TABS
25.0000 mg | ORAL_TABLET | Freq: Three times a day (TID) | ORAL | Status: DC
  Administered 2022-08-15 – 2022-08-16 (×4): 25 mg via ORAL
  Filled 2022-08-15 (×4): qty 1

## 2022-08-15 NOTE — Progress Notes (Signed)
No acute event overnight. Urine test pending, two missed opportunities to collect urine sample overnight, patient was not able to use the urinal at bed side. Day shift informed to give it another try.

## 2022-08-15 NOTE — Evaluation (Signed)
Occupational Therapy Evaluation Patient Details Name: Gregory Nunez. MRN: [id number] DOB: 1946-03-19 Today's Date: 08/15/2022   History of Present Illness Pt is a 77 y.o. male who presented 08/13/22 with a headache and noted to be hypertensive in urgent care. Pt admitted with severe, symptomatic hypertension and AKI on CKD 3b. 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   Clinical Impression   PTA pt lives alone in a apt with a help call bell system and his nephew assists intermittently with IADL tasks. Recommend follow up with HHOT to maximize functional level of independence and reduce risk of falls. Acute OT to follow.       Recommendations for follow up therapy are one component of a multi-disciplinary discharge planning process, led by the attending physician.  Recommendations may be updated based on patient status, additional functional criteria and insurance authorization.   Assistance Recommended at Discharge Intermittent Supervision/Assistance  Patient can return home with the following Assistance with cooking/housework;Direct supervision/assist for medications management;Direct supervision/assist for financial management;Assist for transportation    Functional Status Assessment  Patient has had a recent decline in their functional status and demonstrates the ability to make significant improvements in function in a reasonable and predictable amount of time.  Equipment Recommendations  Tub/shower seat    Recommendations for Other Services       Precautions / Restrictions Precautions Precautions: Fall Restrictions Weight Bearing Restrictions: No      Mobility Bed Mobility Overal bed mobility: Modified Independent                  Transfers Overall transfer level: Needs assistance Equipment used: Rolling walker (2  wheels) Transfers: Sit to/from Stand Sit to Stand: Supervision           General transfer comment: Min guard assist for safety, no LOB      Balance Overall balance assessment: Needs assistance Sitting-balance support: No upper extremity supported, Feet supported Sitting balance-Leahy Scale: Good     Standing balance support: Bilateral upper extremity supported, During functional activity, Reliant on assistive device for balance Standing balance-Leahy Scale: Poor Standing balance comment: Reliant on RW to ambulate (uses cane at baseline)                           ADL either performed or assessed with clinical judgement   ADL                                               Vision         Perception     Praxis      Pertinent Vitals/Pain Pain Assessment Pain Assessment: Faces Faces Pain Scale: Hurts little more Pain Location: neck with L rotation Pain Descriptors / Indicators: Discomfort, Grimacing, Guarding Pain Intervention(s): Limited activity within patient's tolerance     Hand Dominance     Extremity/Trunk Assessment Upper Extremity Assessment Upper Extremity Assessment: RUE deficits/detail RUE Deficits / Details: residual deficits from previous CVA; uses as a functional assist       Cervical / Trunk Assessment Cervical / Trunk Assessment: Normal   Communication Communication Communication: Expressive difficulties (residual speech issues from prior CVA 2022)   Cognition Arousal/Alertness: Awake/alert Behavior During Therapy: [facility] for tasks assessed/performed  Overall Cognitive Status: Difficult to assess                                 General Comments: good awareness of safety during ADL; tangential at times     General Comments       Exercises     Shoulder Instructions      Home Living Family/patient expects to be discharged to:: Private residence Living Arrangements: Alone Available Help at  Discharge: Family;Available 24 hours/day (per pt, his nephew can assist but does work but only works "when he wants to") Type of Home: Apartment Home Access: Elevator     Home Layout: One level     Bathroom Shower/Tub: Producer, television/film/video: Standard Bathroom Accessibility: Yes How Accessible: Accessible via walker Home Equipment: Grab bars - tub/shower;Gilmer Mor - single point (says he doe not have a shower seat)          Prior Functioning/Environment Prior Level of Function : Independent/Modified Independent;History of Falls (last six months)             Mobility Comments: uses cane, reports he falls on average 1x every 2 months ADLs Comments: Does not drive, nephew drives him to get groceries/food; does not cook, warms up frozen meals or picks up meals; sits in the tub; would like a shower seat        OT Problem List: Impaired balance (sitting and/or standing);Decreased knowledge of use of DME or AE;Impaired tone;Impaired UE functional use      OT Treatment/Interventions:      OT Goals(Current goals can be found in the care plan section) Acute Rehab OT Goals Patient Stated Goal: to be able to take care of himself OT Goal Formulation: With patient Time For Goal Achievement: 08/29/22 Potential to Achieve Goals: Good  OT Frequency: Min 2X/week    Co-evaluation              AM-PAC OT "6 Clicks" Daily Activity     Outcome Measure Help from another person eating meals?: None Help from another person taking care of personal grooming?: None Help from another person toileting, which includes using toliet, bedpan, or urinal?: None Help from another person bathing (including washing, rinsing, drying)?: A Little Help from another person to put on and taking off regular upper body clothing?: None Help from another person to put on and taking off regular lower body clothing?: A Little 6 Click Score: 22   End of Session Equipment Utilized During Treatment: Gait  belt;Rolling walker (2 wheels) Nurse Communication: Mobility status  Activity Tolerance: Patient tolerated treatment well Patient left: in chair;with call bell/phone within reach;with chair alarm set  OT Visit Diagnosis: Unsteadiness on feet (R26.81);Muscle weakness (generalized) (M62.81);History of falling (Z91.81)                Time: 1336-1410 OT Time Calculation (min): 34 min Charges:  OT General Charges $OT Visit: 1 Visit OT Evaluation $OT Eval Moderate Complexity: 1 Mod OT Treatments $Self Care/Home Management : 8-22 mins  Luisa Dago, OT/L   Acute OT Clinical Specialist Acute Rehabilitation Services Pager [phone] Office [phone]   [facility] 08/15/2022, 2:18 PM

## 2022-08-15 NOTE — Progress Notes (Signed)
Physical Therapy Treatment Patient Details Name: Gregory Nunez. MRN: [id number] DOB: [birth year] Today's Date: 08/15/2022   History of Present Illness Pt is a 77 y.o. male who presented 08/13/22 with a headache and noted to be hypertensive in urgent care. Pt admitted with severe, symptomatic hypertension and AKI on CKD 3b. 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    PT Comments    Pt demonstrated x1 L lateral LOB bout when his L knee buckled initially when ambulating, needing modA to recover. He reports this happens occasionally. Once pt recovered he did not demonstrate any further buckling, but some knee instability was noted. Pt only required min guard assist the remainder of the gait bout using a RW for support. Pt is at risk for falls due to his L leg weakness, particularly his quads, thus performed standing exercises to improve his muscular strength the remainder of the session. Will continue to follow acutely.     Recommendations for follow up therapy are one component of a multi-disciplinary discharge planning process, led by the attending physician.  Recommendations may be updated based on patient status, additional functional criteria and insurance authorization.  Follow Up Recommendations       Assistance Recommended at Discharge Intermittent Supervision/Assistance  Patient can return home with the following A little help with walking and/or transfers;A little help with bathing/dressing/bathroom;Assistance with cooking/housework;Direct supervision/assist for financial management;Direct supervision/assist for medications management;Assist for transportation   Equipment Recommendations  Rolling walker (2 wheels);Other (comment) (tub/shower seat)    Recommendations for Other Services       Precautions / Restrictions  Precautions Precautions: Fall Restrictions Weight Bearing Restrictions: No     Mobility  Bed Mobility Overal bed mobility: Needs Assistance Bed Mobility: Sit to Supine       Sit to supine: Supervision, HOB elevated   General bed mobility comments: Supervision for safety, HOB elevated    Transfers Overall transfer level: Needs assistance Equipment used: Rolling walker (2 wheels) Transfers: Sit to/from Stand Sit to Stand: Supervision           General transfer comment: Pt initially trying to pull up on RW to stand, needing cues to push up from armrests of chair. 1x from recliner, 10x from EOB, no LOB, supervision for safety    Ambulation/Gait Ambulation/Gait assistance: Min guard, Mod assist Gait Distance (Feet): 420 Feet Assistive device: Rolling walker (2 wheels) Gait Pattern/deviations: Step-through pattern, Decreased stride length, Trunk flexed, Knees buckling, Staggering left Gait velocity: reduced Gait velocity interpretation: <1.8 ft/sec, indicate of risk for recurrent falls   General Gait Details: Pt takes slow steps with step-through gait pattern. x1 L lateral LOB noted at the beginning of the gait bout due to L knee buckling, modA to recover, otherwise min guard assist with no further buckling remainder of gait bout. Slows and almost stops ambulating when cued to turn head side to side   Stairs             Wheelchair Mobility    Modified Rankin (Stroke Patients Only)       Balance Overall balance assessment: Needs assistance Sitting-balance support: No upper extremity supported, Feet supported Sitting balance-Leahy Scale: Good     Standing balance support: Bilateral upper extremity supported, During functional activity, Reliant on assistive device for balance Standing balance-Leahy Scale: Poor Standing balance comment: Reliant on RW to ambulate, x1 LOB needing modA to recover  Cognition  Arousal/Alertness: Awake/alert Behavior During Therapy: WFL for tasks assessed/performed Overall Cognitive Status: No family/caregiver present to determine baseline cognitive functioning                                 General Comments: Slow to process. Difficulty recalling his DOB. Unsure if this is baseline or not.        Exercises General Exercises - Lower Extremity Hip ABduction/ADduction: AROM, Strengthening, Both, 10 reps, Standing (with RW support, having to place R foot down when abducting and then again when returning to midline due to L knee instability) Hip Flexion/Marching: AROM, Strengthening, Both, 10 reps, Standing (with RW support, pt slower and more cautious when raising his R due to L knee instability in stance) Other Exercises Other Exercises: sit <> stand from EOB 10x    General Comments        Pertinent Vitals/Pain Pain Assessment Pain Assessment: Faces Faces Pain Scale: Hurts little more Pain Location: generalized Pain Descriptors / Indicators: Discomfort, Grimacing, Guarding Pain Intervention(s): Limited activity within patient's tolerance, Monitored during session, Repositioned    Home Living Family/patient expects to be discharged to:: Private residence Living Arrangements: Alone Available Help at Discharge: Family;Available 24 hours/day (per pt, his nephew can assist but does work but only works "when he wants to") Type of Home: Apartment Home Access: Elevator       Home Layout: One level Home Equipment: Grab bars - tub/shower;Gilmer Mor - single point (says he doe not have a shower seat)      Prior Function            PT Goals (current goals can now be found in the care plan section) Acute Rehab PT Goals Patient Stated Goal: to improve PT Goal Formulation: With patient Time For Goal Achievement: 08/28/22 Potential to Achieve Goals: Good Progress towards PT goals: Progressing toward goals    Frequency    Min 3X/week       PT Plan Equipment recommendations need to be updated    Co-evaluation              AM-PAC PT "6 Clicks" Mobility   Outcome Measure  Help needed turning from your back to your side while in a flat bed without using bedrails?: None Help needed moving from lying on your back to sitting on the side of a flat bed without using bedrails?: A Little Help needed moving to and from a bed to a chair (including a wheelchair)?: A Little Help needed standing up from a chair using your arms (e.g., wheelchair or bedside chair)?: A Little Help needed to walk in hospital room?: A Lot Help needed climbing 3-5 steps with a railing? : A Lot 6 Click Score: 17    End of Session Equipment Utilized During Treatment: Gait belt Activity Tolerance: Patient tolerated treatment well Patient left: with call bell/phone within reach;in bed;with bed alarm set   PT Visit Diagnosis: Unsteadiness on feet (R26.81);Other abnormalities of gait and mobility (R26.89);Muscle weakness (generalized) (M62.81);History of falling (Z91.81);Difficulty in walking, not elsewhere classified (R26.2)     Time: 1610-9604 PT Time Calculation (min) (ACUTE ONLY): 17 min  Charges:  $Therapeutic Exercise: 8-22 mins                     Raymond Gurney, PT, DPT Acute Rehabilitation Services  Office: [phone]    Jewel Baize 08/15/2022, 4:05 PM

## 2022-08-15 NOTE — Progress Notes (Signed)
HD#2 SUBJECTIVE:  Patient Summary: Gregory Nunez. is a 77 y.o. with a pertinent PMH of hypertension, seizure disorder, previous cerebral infarction in 2022 w/ residual speech impediment, who presented with headache and admitted for severe symptomatic hypertension and nephrotic range proteinuria.   Overnight Events: No acute events overnight  Interim History: The patient states that he feels well today. He does still have a headache, but this is improved compared to admission. Denies any CP, SOB.   OBJECTIVE:  Vital Signs: Vitals:   08/14/22 1610 08/14/22 2001 08/14/22 2321 08/15/22 0415  BP: (!) 144/70 (!) 168/92 (!) 166/82 (!) 176/93  Pulse: 95 92 93 (!) 101  Resp: 16 16 16 17   Temp: 97.7 F (36.5 C) 98.4 F (36.9 C) 98.1 F (36.7 C) 98 F (36.7 C)  TempSrc: Oral Oral    SpO2: 100% 100% 99% 99%   Supplemental O2: Room Air SpO2: 99 %  There were no vitals filed for this visit.   Intake/Output Summary (Last 24 hours) at 08/15/2022 1610 Last data filed at 08/15/2022 0010 Gross per 24 hour  Intake --  Output 900 ml  Net -900 ml   Net IO Since Admission: -900 mL [08/15/22 0608]  Physical Exam: General: Elderly male laying in bed. No acute distress. CV: RRR. No murmurs appreciated. No LE edema.  Pulmonary: Lungs CTAB. Normal effort.  Extremities: Normal bulk and tone. Normal ROM. Skin: Warm and dry.  Neuro: A&Ox3. Dysarthria and word finding difficulty (baseline). No focal deficit.  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 Proteinuria could be  2/2 untreated HCV, which puts him at risk for cryoglobulinemia and immune complex deposition. ANA, ANCA, complements, SPEP, UPEP, and free light chains still pending. The patient may need a renal biopsy depending on the results of the aforementioned tests. - Daily BMP to monitor kidney function - Nephrology consulted, considering renal biopsy - Follow up above lab results  #Severe, symptomatic hypertension, improved BP remains elevated to 160-170s/80-90s with evidence of organ dysfunction (AKI). Started on amlodipine 5 mg daily yesterday, and will start back on home losartan today. Now that the patient is out of the 24 hour window for severe symptomatic hypertension, will work to normalize BP.  - Continue amlodipine 5 mg daily - Losartan 50 mg daily - Will add low-dose hydralazine next if BP remains elevated   #Normocytic anemia Secondary to iron deficiency anemia and anemia of chronic disease. Hb stable. Patient was treated with 1 dose of IV Ferrlecit 250 mg yesterday. Will start PO iron at discharge.   #Hypokalemia K low at 3.2 this morning. - Kclor 40 mEq x 2 doses    Best Practice: Diet: Regular diet IVF: Fluids: none VTE: enoxaparin (LOVENOX) injection 30 mg Start: 08/13/22 2200 Code: Full AB: none Therapy Recs: Pending Family Contact: Casimiro Needle, nephew, to be notified. DISPO: Anticipated discharge in 1-3 days to Home pending  glomerulonephritis workup .  Signature: Elza Rafter, D.O.  Internal Medicine Resident, PGY-2 [facility]  Pager: [phone] 6:08 AM, 08/15/2022  Please contact the on call pager after 5 pm and on weekends at [phone].

## 2022-08-15 NOTE — Progress Notes (Signed)
[facility] Progress Note  Name: Gregory Nunez. MRN: [id number] DOB: 1946-01-28  Chief Complaint:  Headache   Subjective:  Strict ins/outs are not available.  He had 900 mL as well as 2 unmeasured urine voids over 4/27.  He feels well today.  Slight headache when he lays down a certain way  Review of systems:  He denies shortness of breath  Denies chest pain  Denies n/v  -------------- Background on consult:  Gregory Nunez. is a 77 y.o. 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  He was found to be severely hypertensive in the range of 204/95 and 230/136.  He previously was on only losartan 50 mg daily.  The headache improved with lowering his blood pressure.  The team has added hydralazine and amlodipine to his regimen and resumed his home losartan 50 mg daily. Note that he has been out of his medication for several weeks.  His creatinine is at his recent baseline of 2-2.5. There is a gap in data from 03/2021 to 11/2021 but this does appear to be consistent with August 2023 labs.  The patient is not married, has no children, his parents are deceased, has 82 brothers, and identifies his nephew as the person who would help him with medical decisions.  I called his nephew on the phone.  The patient states that he has been out of medication for several months.  His nephew wants to make sure that he has whatever he needs.  The patient would like to go home and get clothes or for someone to bring him something.  When he gets excited trying to talk is difficult.  His headache is resolved.  No shortness of breath.      Intake/Output Summary (Last 24 hours) at 08/15/2022 1553 Last data filed at 08/15/2022 1400 Gross per 24 hour  Intake 480 ml   Output 500 ml  Net -20 ml    Vitals:  Vitals:   08/14/22 2321 08/15/22 0415 08/15/22 0754 08/15/22 1156  BP: (!) 166/82 (!) 176/93 (!) 161/87 (!) 168/92  Pulse: 93 (!) 101 89 100  Resp: 16 17 16 16   Temp: 98.1 F (36.7 C) 98 F (36.7 C) 98.7 F (37.1 C) 98.4 F (36.9 C)  TempSrc:    Oral  SpO2: 99% 99% 94% 100%     Physical Exam:  General: adult male in bed in NAD  HEENT: NCAT Eyes: EOMI sclera anicteric Neck: supple trachea midline Heart: S1S2 no rub  Lungs: clear to auscultation bilaterally; normal work of breathing on room air  Abdomen: soft/nt/nd Extremities: no edema appreciated; no cyanosis or clubbing  Skin: no rash on extremities exposed Neuro: alert and oriented to person, year (though delay in answer he got it correct), location, and situation; sometimes dysarthria when more emotional  Psych: no anxiety or agitation   Medications reviewed   Labs:     Latest Ref Rng & Units 08/15/2022    3:05 AM 08/14/2022    6:10 AM 08/13/2022   12:21 PM  BMP  Glucose 70 - 99 mg/dL 97  409  811   BUN 8 - 23 mg/dL 36  25  23   Creatinine 0.61 - 1.24 mg/dL 9.14  7.82  9.56   Sodium 135 - 145 mmol/L 135  137  141   Potassium 3.5 - 5.1 mmol/L 3.2  3.0  3.4   Chloride 98 - 111 mmol/L 106  106  108   CO2 22 - 32 mmol/L 22  22    Calcium 8.9 - 10.3 mg/dL 8.4  8.9       Assessment/Plan:   # Hypertensive emergency - He has been out of medication for months  - Improved control but BP dropping to the 140's on consult so I paused hydralazine on 4/27 PM.  He does have a slight headache today.  Resume hydralazine (a new medication) at 25 mg TID for now - I have resumed his losartan 50 mg daily - since admission he just missed the 4/27 dose.  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  Up/cr ratio 7330 mg/g.  Note that most recently his A1c has been controlled which may have happened as his CKD progressed.  He has had > 300 mg/dL on his urinalyses since 2013.  Note chronic hep B.  I don't see another up/cr ratio for comparison but this does not appear to be a new finding.  Volume status is controlled - ANCA, ANA, complements, SPEP, UPEP, and free light chains were sent and pending   - Please cautiously hold aspirin pending the labs above  - If his GN work-up above comes back normal aside from DM, I am less convinced that he would need a renal biopsy.  He has strong preferences on our initial meeting and this is not a new process - Resumed RAAS blockade  - Once back on RAAS blockade and on clinic follow-up will consider addition of farxiga or jardiance  - repeat up/cr ratio to verify - I have ordered this but it still hasn't been sent.  I have asked nursing to ensure this is sent    # Hypokalemia - he has already gotten potassium repletion today.      # Normocytic Anemia  - May be secondary in part to CKD - iron deficient  - s/p Ferrlecit x 1 dose.  Started oral iron daily    # Hepatitis C - This is chronic per charting  - Per primary team   Disposition - pending the serologic work-up may benefit from a renal biopsy, as above      Gregory Emms, MD 08/15/2022 4:31 PM

## 2022-08-16 ENCOUNTER — Other Ambulatory Visit (HOSPITAL_COMMUNITY): Payer: Self-pay

## 2022-08-16 DIAGNOSIS — N1832 Chronic kidney disease, stage 3b: Secondary | ICD-10-CM | POA: Diagnosis not present

## 2022-08-16 DIAGNOSIS — I129 Hypertensive chronic kidney disease with stage 1 through stage 4 chronic kidney disease, or unspecified chronic kidney disease: Secondary | ICD-10-CM | POA: Diagnosis not present

## 2022-08-16 DIAGNOSIS — R808 Other proteinuria: Secondary | ICD-10-CM | POA: Diagnosis not present

## 2022-08-16 DIAGNOSIS — N179 Acute kidney failure, unspecified: Secondary | ICD-10-CM | POA: Diagnosis not present

## 2022-08-16 LAB — KAPPA/LAMBDA LIGHT CHAINS
Kappa free light chain: 172 mg/L — ABNORMAL HIGH (ref 3.3–19.4)
Kappa, lambda light chain ratio: 2.8 — ABNORMAL HIGH (ref 0.26–1.65)
Lambda free light chains: 61.4 mg/L — ABNORMAL HIGH (ref 5.7–26.3)

## 2022-08-16 LAB — BASIC METABOLIC PANEL
Anion gap: 7 (ref 5–15)
BUN: 41 mg/dL — ABNORMAL HIGH (ref 8–23)
CO2: 20 mmol/L — ABNORMAL LOW (ref 22–32)
Calcium: 8.1 mg/dL — ABNORMAL LOW (ref 8.9–10.3)
Chloride: 109 mmol/L (ref 98–111)
Creatinine, Ser: 2.83 mg/dL — ABNORMAL HIGH (ref 0.61–1.24)
GFR, Estimated: 23 mL/min — ABNORMAL LOW (ref >60)
Glucose, Bld: 96 mg/dL (ref 70–99)
Potassium: 3.7 mmol/L (ref 3.5–5.1)
Sodium: 136 mmol/L (ref 135–145)

## 2022-08-16 LAB — CBC
HCT: 27 % — ABNORMAL LOW (ref 39.0–52.0)
Hemoglobin: 9.2 g/dL — ABNORMAL LOW (ref 13.0–17.0)
MCH: 31 pg (ref 26.0–34.0)
MCHC: 34.1 g/dL (ref 30.0–36.0)
MCV: 90.9 fL (ref 80.0–100.0)
Platelets: 174 10*3/uL (ref 150–400)
RBC: 2.97 MIL/uL — ABNORMAL LOW (ref 4.22–5.81)
RDW: 12.3 % (ref 11.5–15.5)
WBC: 4.9 10*3/uL (ref 4.0–10.5)
nRBC: 0 % (ref 0.0–0.2)

## 2022-08-16 LAB — GLUCOSE, CAPILLARY
Glucose-Capillary: 92 mg/dL (ref 70–99)
Glucose-Capillary: 123 mg/dL — ABNORMAL HIGH (ref 70–99)

## 2022-08-16 MED ORDER — ENOXAPARIN SODIUM 40 MG/0.4ML IJ SOSY: 40.0000 mg | PREFILLED_SYRINGE | INTRAMUSCULAR | Status: DC

## 2022-08-16 MED ORDER — HYDRALAZINE HCL 25 MG PO TABS
25.0000 mg | ORAL_TABLET | Freq: Three times a day (TID) | ORAL | 0 refills | Status: DC
  Filled 2022-08-16: qty 90, 30d supply, fill #0

## 2022-08-16 MED ORDER — POLYSACCHARIDE IRON COMPLEX 150 MG PO CAPS
150.0000 mg | ORAL_CAPSULE | Freq: Every day | ORAL | 0 refills | Status: DC
  Filled 2022-08-16: qty 30, 30d supply, fill #0

## 2022-08-16 MED ORDER — LOSARTAN POTASSIUM 50 MG PO TABS
50.0000 mg | ORAL_TABLET | Freq: Every day | ORAL | 0 refills | Status: DC
Start: 2022-08-16 — End: 2022-10-14
  Filled 2022-08-16: qty 30, 30d supply, fill #0

## 2022-08-16 MED ORDER — AMLODIPINE BESYLATE 5 MG PO TABS
5.0000 mg | ORAL_TABLET | Freq: Every day | ORAL | 0 refills | Status: DC
  Filled 2022-08-16: qty 30, 30d supply, fill #0

## 2022-08-16 MED ORDER — ATORVASTATIN CALCIUM 40 MG PO TABS
40.0000 mg | ORAL_TABLET | Freq: Every day | ORAL | 0 refills | Status: DC
Start: 2022-08-16 — End: 2022-10-14
  Filled 2022-08-16: qty 30, 30d supply, fill #0

## 2022-08-16 NOTE — Progress Notes (Signed)
Patient ID: Gregory Mabey., male   DOB: 08/25/1945, 77 y.o.   MRN: [id number] S: Feeling much better today. No headache. O:BP (!) 149/84 (BP Location: Right Arm)   Pulse 92   Temp 98.8 F (37.1 C) (Oral)   Resp 17   SpO2 94%   Intake/Output Summary (Last 24 hours) at 08/16/2022 0911 Last data filed at 08/16/2022 0345 Gross per 24 hour  Intake 480 ml  Output 750 ml  Net -270 ml   Intake/Output: I/O last 3 completed shifts: In: 480 [P.O.:480] Out: 1050 [Urine:1050]  Intake/Output this shift:  No intake/output data recorded. Weight change:  Gen: NAD CVS: RRR Resp: CTA Abd: +BS, soft, NT/ND Ext: no edema  Recent Labs  Lab 08/13/22 1128 08/13/22 1221 08/14/22 0610 08/15/22 0305 08/16/22 0327  NA 137 141 137 135 136  K 3.3* 3.4* 3.0* 3.2* 3.7  CL 109 108 106 106 109  CO2 21*  --  22 22 20*  GLUCOSE 134* 130* 105* 97 96  BUN 20 23 25* 36* 41*  CREATININE 2.26* 2.40* 2.39* 2.63* 2.83*  ALBUMIN 2.9*  --   --   --   --   CALCIUM 8.8*  --  8.9 8.4* 8.1*  AST 22  --   --   --   --   ALT 16  --   --   --   --    Liver Function Tests: Recent Labs  Lab 08/13/22 1128  AST 22  ALT 16  ALKPHOS 55  BILITOT 0.5  PROT 7.6  ALBUMIN 2.9*   No results for input(s): "LIPASE", "AMYLASE" in the last 168 hours. No results for input(s): "AMMONIA" in the last 168 hours. CBC: Recent Labs  Lab 08/13/22 1128 08/13/22 1221 08/14/22 0610 08/16/22 0327  WBC 4.4  --  5.3 4.9  NEUTROABS 2.6  --   --   --   HGB 9.5* 9.9* 9.3* 9.2*  HCT 28.6* 29.0* 26.9* 27.0*  MCV 92.3  --  89.4 90.9  PLT 156  --  164 174   Cardiac Enzymes: No results for input(s): "CKTOTAL", "CKMB", "CKMBINDEX", "TROPONINI" in the last 168 hours. CBG: Recent Labs  Lab 08/15/22 0414 08/15/22 1154 08/15/22 1614 08/15/22 2107 08/16/22 0611  GLUCAP 98 92 109* 124* 92    Iron Studies:  Recent Labs    08/14/22 0610  IRON 39*  TIBC 377  FERRITIN 15*   Studies/Results: No results found.   amLODipine  5 mg Oral Daily   atorvastatin  40 mg Oral QHS   enoxaparin (LOVENOX) injection  30 mg Subcutaneous Q24H   hydrALAZINE  25 mg Oral Q8H   iron polysaccharides  150 mg Oral Daily   losartan  50 mg Oral Daily    BMET    Component Value Date/Time   NA 136 08/16/2022 0327   NA 141 12/14/2021 0932   K 3.7 08/16/2022 0327   CL 109 08/16/2022 0327   CO2 20 (L) 08/16/2022 0327   GLUCOSE 96 08/16/2022 0327   BUN 41 (H) 08/16/2022 0327   BUN 24 12/14/2021 0932   CREATININE 2.83 (H) 08/16/2022 0327   CREATININE 1.52 (H) 08/28/2020 1427   CALCIUM 8.1 (L) 08/16/2022 0327   GFRNONAA 23 (L) 08/16/2022 0327   GFRNONAA 45 (L) 08/28/2020 1427   GFRAA 52 (L) 08/28/2020 1427   CBC    Component Value Date/Time   WBC 4.9 08/16/2022 0327   RBC 2.97 (L) 08/16/2022 0327   HGB  9.2 (L) 08/16/2022 0327   HGB 8.5 (L) 11/06/2020 1031   HCT 27.0 (L) 08/16/2022 0327   HCT 25.7 (L) 11/06/2020 1031   PLT 174 08/16/2022 0327   PLT 151 11/06/2020 1031   MCV 90.9 08/16/2022 0327   MCV 91 11/06/2020 1031   MCH 31.0 08/16/2022 0327   MCHC 34.1 08/16/2022 0327   RDW 12.3 08/16/2022 0327   RDW 15.1 11/06/2020 1031   LYMPHSABS 0.9 08/13/2022 1128   MONOABS 0.7 08/13/2022 1128   EOSABS 0.1 08/13/2022 1128   BASOSABS 0.0 08/13/2022 1128   Background on consult:  Gregory Poli. is a 77 y.o. 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  He was found to be severely hypertensive in the range of 204/95 and 230/136.  He previously was on only losartan 50 mg daily.  The headache improved with lowering his blood pressure.  The team has added hydralazine and amlodipine to his regimen and resumed his home losartan 50 mg daily. Note that he has been out of his medication for several  weeks.  His creatinine is at his recent baseline of 2-2.5. There is a gap in data from 03/2021 to 11/2021 but this does appear to be consistent with August 2023 labs.  Assessment/Plan:  Hypertensive emergency - due to noncompliance with medications.  Markedly improved with addition of hydralazine and amlodipine.  Continue with current regimen AKI/CKD stage IIIb - underlying CKD likely due to combination of diabetic nephropathy and HTN nephrosclerosis.  Bump in Scr likely due to resumption of ARB and blood pressure control.  Will need to have follow up with Dr. Malen Gauze after discharge (she will arrange for 2 week follow up).  Continue with current regimen for now. 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  Has had overt proteinuria since 2013.  GN workup underway.  Normal complements, ANCA, ANA, SPEP/UPEP pending and will f/u as an outpatient.  Hold off on SGLT-2 inhibitor for now given AKI/CKD stage IIIb.  Renal US unremarkable.  No indication for biopsy at this time.   Anemia of CKD stage IIIb - s/p IV iron and will follow but may need ESA as an outpatient. Hepatitis C, chronic - no h/o treatment.  Will check HCV RNA levels and recommend outpatient follow up with hepatology.  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  Cont with atorvastatin. Seizure disorder - not taking keppra. DM type 2 - no medications at this time per primary. Disposition - stable for discharge from renal prospective with close follow up in 2 weeks with Dr. Malen Gauze.  Gregory Cords, MD [facility]

## 2022-08-16 NOTE — Discharge Summary (Signed)
Name: Gregory Nunez. MRN: [id number] DOB: 01-20-1946 77 y.o. PCP: Champ Mungo, DO  Date of Admission: 08/13/2022 10:52 AM Date of Discharge:  08/16/2022 Attending Physician: Dr. Oswaldo Done  DISCHARGE DIAGNOSIS:  Primary Problem: Severe hypertension   Hospital Problems: Principal Problem:   Severe hypertension Active Problems:   Type 2 diabetes mellitus without complication (HCC)   Acute renal failure superimposed on stage 3a chronic kidney disease (HCC)   Anemia due to chronic kidney disease    DISCHARGE MEDICATIONS:   Allergies as of 08/16/2022       Reactions   Penicillins Other (See Comments)   Pt does not remember reaction but states he woke up in the hospital after taking        Medication List     STOP taking these medications    levETIRAcetam 500 MG tablet Commonly known as: KEPPRA   mupirocin ointment 2 % Commonly known as: BACTROBAN   sodium chloride 0.65 % Soln nasal spray Commonly known as: OCEAN       TAKE these medications    amLODipine 5 MG tablet Commonly known as: NORVASC Take 1 tablet (5 mg total) by mouth daily. Start taking on: August 17, 2022   atorvastatin 40 MG tablet Commonly known as: LIPITOR Take 1 tablet (40 mg total) by mouth daily.   hydrALAZINE 25 MG tablet Commonly known as: APRESOLINE Take 1 tablet (25 mg total) by mouth every 8 (eight) hours.   iron polysaccharides 150 MG capsule Commonly known as: NIFEREX Take 1 capsule (150 mg total) by mouth daily. Start taking on: August 17, 2022   losartan 50 MG tablet Commonly known as: Cozaar Take 1 tablet (50 mg total) by mouth daily.               Durable Medical Equipment  (From admission, onward)           Start     Ordered   08/14/22 1347  For home use only DME Walker rolling  Once       Question Answer Comment  Walker: With 5 Inch Wheels   Patient needs a walker to treat with the following condition Mobility impaired      08/14/22 1346             DISPOSITION AND FOLLOW-UP:  Gregory Nunez. was discharged from [facility] in Stable condition. At the hospital follow up visit please address:  Severe hypertension Ensure patient is taking losartan, hydralazine, and amlodipine Nephrotic range proteinuria Needs follow up with nephrology (advised to see Dr. Malen Gauze in 2 weeks) Repeat BMP Follow up pending studies: SPEP, UPEP, free light chains, ANA, ANCA still in process Previous history of alcohol withdrawal seizures: Patient no longer drinks alcohol and has not taken keppra in months, thus we discontinued it at discharge  Follow-up Recommendations: Consults: Nephrology Labs: Basic Metabolic Profile Studies: none New Medications: Hydralazine 25 mg TID, amlodipine 5 mg daily, losartan 50 mg daily, iron supplement  Follow-up Appointments:  Follow-up Information     Adron Bene, MD .   Contact information: [address] [place] [address] [phone]         Estanislado Emms, MD. Call.   Specialty: Nephrology Why: make appt Contact information: [address] [place] [address] [phone]                 HOSPITAL COURSE:  Patient Summary: Gregory Nunez. is a 77 y.o. 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    #Severe, symptomatic hypertension Presented w/ headache. Found to be significantly hypertensive w/ MAP in the 160s. CT head negative for abnormalities. Had signs of end-organ dysfunction w/ AKI on CKD3b. Received PO/IV hydralazine, IV labetalol, and PO amlodipine/losartan in the ED. Blood pressure gradually improved, with SBP in the 140-150s on the day of discharge. He was discharged with his home losartan 50 mg daily, in addition to amlodipine 5  mg daily and hydralazine 25 mg TID.   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 Cr elevated at 2.26 on admission and up to 2.8 on the day of discharge. BUN/Cr ratio suggests intrinsic etiology. UA w/ > 300 protein. Urine protein/creatinine ratio elevated at 7.3. Renal US normal. Likely in the setting of significant hypertension vs diabetes vs untreated HCV, which would put him at risk for cryoglobulinemia and immune complex deposition. ANA, ANCA, SPEP, UPEP, and free light chains were still in process by the time of discharge and will need followed up. The patient was also recommended to follow up with Dr. Malen Gauze (nephrology) in 2 weeks.     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   #Normocytic anemia Hgb stable during admission. Anemia is likely 2/2 CKD and iron deficiency anemia. He received 1 dose of IV ferrlecit and was discharged with PO iron supplementation.   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 H previously was prescribed keppra 500 mg BID but has not picked it up since 11/2021. No signs of seizures at this time. Discontinued keppra at discharge.    #Type 2 diabetes A1c 7.1% in 11/2021 and now 5.8%. Not on any diabetic medications at home.  4/27:    DISCHARGE INSTRUCTIONS:   Discharge Instructions     Call MD for:  difficulty breathing, headache or visual disturbances   Complete by: As directed    Call MD for:  persistant dizziness or light-headedness   Complete by: As directed    Call MD for:  severe uncontrolled pain   Complete by: As directed    Diet - low sodium heart healthy   Complete by: As directed    Discharge instructions   Complete by: As directed    Dear Gregory Nunez,  You were hospitalized for high blood pressure and kidney disease. Your kidneys are still not back to normal, so you will need to  follow up with the nephrologist (kidney doctor), Dr. Malen Gauze, in 2 weeks. Her phone number is [phone] - so please call to make an appointment.  You have a few new medicines you will need to take for your blood pressure- it is very important to take these everyday:  1. Amlodipine 5 mg once a day 2. Losartan 50 mg once a day 3. Hydralazine 25 mg three times a day  You also were started on an iron pill, since your iron levels are low. You can take the iron pill (Niferex) once a day.   We have set you up an appointment in our clinic, since we are your primary care doctors. That appointment is on 5/2 at 3:45 PM on the ground floor of the hospital. If you have any questions or concerns, call our clinic at [phone] or after  hours call [phone] and ask for the internal medicine resident on call.  Take care, Dr. Peterson Lombard and Dr. Ned Card   Increase activity slowly   Complete by: As directed        SUBJECTIVE:  [name]. was seen and evaluated on the day of discharge. He feels well and does not have a headache at this time.   Discharge Vitals:   BP (!) 155/85 (BP Location: Left Arm)   Pulse 93   Temp 98.5 F (36.9 C) (Oral)   Resp 17   Ht 5\' 11"  (1.803 m)   SpO2 99%   BMI 23.29 kg/m   OBJECTIVE:  General: Pleasant, elderly male laying in bed. No acute distress. CV: RRR. No murmurs. No LE edema Pulmonary: Normal work of breathing  Extremities: Normal bulk and tone. Normal ROM.  Skin: Warm and dry.  Neuro: A&Ox3. Dysarthria and word finding difficulty (at baseline). No focal deficit Psych: Normal mood and affect    Pertinent Labs, Studies, and Procedures:     Latest Ref Rng & Units 08/16/2022    3:27 AM 08/14/2022    6:10 AM 08/13/2022   12:21 PM  CBC  WBC 4.0 - 10.5 K/uL 4.9  5.3    Hemoglobin 13.0 - 17.0 g/dL 9.2  9.3  9.9   Hematocrit 39.0 - 52.0 % 27.0  26.9  29.0   Platelets 150 - 400 K/uL 174  164         Latest Ref Rng & Units 08/16/2022    3:27 AM  08/15/2022    3:05 AM 08/14/2022    6:10 AM  CMP  Glucose 70 - 99 mg/dL 96  97  952   BUN 8 - 23 mg/dL 41  36  25   Creatinine 0.61 - 1.24 mg/dL 8.41  3.24  4.01   Sodium 135 - 145 mmol/L 136  135  137   Potassium 3.5 - 5.1 mmol/L 3.7  3.2  3.0   Chloride 98 - 111 mmol/L 109  106  106   CO2 22 - 32 mmol/L 20  22  22    Calcium 8.9 - 10.3 mg/dL 8.1  8.4  8.9     US RENAL  Result Date: 08/14/2022 CLINICAL DATA:  027253 Severe hypertension [id number] AKI (acute kidney injury) (HCC) [id number] EXAM: RENAL / URINARY TRACT ULTRASOUND COMPLETE COMPARISON:  CT abdomen pelvis 12/19/2017 FINDINGS: Right Kidney: Renal measurements: 10.7 x 5.1 x 6.4 cm = volume: 183 mL. Echogenicity within normal limits. No mass or hydronephrosis visualized. Left Kidney: Renal measurements: 11.5 x 5.9 x 4.3 cm = volume: 153 mL. Echogenicity within normal limits. No mass or hydronephrosis visualized. Urinary bladder: Appears normal for degree of bladder distention. Prevoid volume of 47 mL. Patient unable to void. Other: None. IMPRESSION: Unremarkable renal ultrasound. Electronically Signed   By: Tish Frederickson M.D.   On: 08/14/2022 01:09   CT Cervical Spine Wo Contrast  Result Date: 08/13/2022 CLINICAL DATA:  Headache, neck pain, and hypertension. Neck pain for 2 days. Patient reports a fall. EXAM: CT CERVICAL SPINE WITHOUT CONTRAST TECHNIQUE: Multidetector CT imaging of the cervical spine was performed without intravenous contrast. Multiplanar CT image reconstructions were also generated. RADIATION DOSE REDUCTION: This exam was performed according to the departmental dose-optimization program which includes automated exposure control, adjustment of the mA and/or kV according to patient size and/or use of iterative reconstruction technique. COMPARISON:  04/12/2021 FINDINGS: Alignment: Straightening of usual cervical lordosis. This is likely positional  or degenerative but could indicate muscle spasm. Alignment is similar to prior  study. No anterior subluxations. Normal alignment of the posterior elements. Skull base and vertebrae: No acute fracture. No primary bone lesion or focal pathologic process. Soft tissues and spinal canal: No prevertebral fluid or swelling. No visible canal hematoma. Disc levels: Degenerative changes throughout with narrowed disc spaces and endplate osteophyte formation. Degenerative changes in the facet joints. Prominent disc osteophyte complex at C3-4 causes moderate anterior effacement of the thecal sac. Uncovertebral and facet joint osteophytes cause some bone encroachment upon the neural foramina bilaterally. 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 Other: None. IMPRESSION: 1. Nonspecific straightening of usual cervical lordosis. No acute displaced fractures. Diffuse degenerative changes. Electronically Signed   By: Burman Nieves M.D.   On: 08/13/2022 15:47   CT Head Wo Contrast  Result Date: 08/13/2022 CLINICAL DATA:  Headache, neck pain, and hypertension. Neck pain for 2 days. Headache for several days. Patient reports a fall. EXAM: CT HEAD WITHOUT CONTRAST TECHNIQUE: Contiguous axial images were obtained from the base of the skull through the vertex without intravenous contrast. RADIATION DOSE REDUCTION: This exam was performed according to the departmental dose-optimization program which includes automated exposure control, adjustment of the mA and/or kV according to patient size and/or use of iterative reconstruction technique. COMPARISON:  MRI brain 04/13/2021.  CT head 04/12/2021 FINDINGS: Brain: Diffuse cerebral atrophy. Ventricular dilatation consistent with central atrophy. Low-attenuation changes in the deep white matter consistent with small vessel ischemia. Confluent low-attenuation changes demonstrated in the left insular region and in the left midbrain consistent with old infarcts. No change in appearance since prior study. Probable old cerebellar infarcts. No  abnormal extra-axial fluid collections. No mass effect or midline shift. Gray-white matter junctions are distinct. Basal cisterns are not effaced. No acute intracranial hemorrhage. Vascular: No hyperdense vessel or unexpected calcification. Skull: Normal. Negative for fracture or focal lesion. Sinuses/Orbits: No acute finding. Other: None. IMPRESSION: No acute intracranial abnormalities. Chronic atrophy and small vessel ischemic changes. 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 Electronically Signed   By: Burman Nieves M.D.   On: 08/13/2022 15:41     Signed: Elza Rafter, D.O.  Internal Medicine Resident, PGY-2 [facility]  Pager: [phone] 11:36 AM, 08/16/2022

## 2022-08-16 NOTE — Progress Notes (Signed)
Physical Therapy Treatment Patient Details Name: Gregory Nunez. MRN: [id number] DOB: 03-09-1946 Today's Date: 08/16/2022   History of Present Illness Pt is a 77 y.o. male who presented 08/13/22 with a headache and noted to be hypertensive in urgent care. Pt admitted with severe, symptomatic hypertension and AKI on CKD 3b. 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    PT Comments    Pt donning clothes, including tennis shoes, independently upon PT arrival. Pt able to reach to floor in sitting and standing without LOB and supervision for safety. Pt ambulated without knee buckling today, 350' with RW and occasional min A for L lean. Pt continues to have fall risk with decreased awareness of this. Encouraged to have family with him if possible. Pt requests patch for smoking cessation. RN to discuss this with him. PT will continue to follow.     Recommendations for follow up therapy are one component of a multi-disciplinary discharge planning process, led by the attending physician.  Recommendations may be updated based on patient status, additional functional criteria and insurance authorization.  Follow Up Recommendations       Assistance Recommended at Discharge Intermittent Supervision/Assistance  Patient can return home with the following A little help with walking and/or transfers;A little help with bathing/dressing/bathroom;Assistance with cooking/housework;Direct supervision/assist for financial management;Direct supervision/assist for medications management;Assist for transportation   Equipment Recommendations  Rolling walker (2 wheels);Other (comment) (tub/shower seat)    Recommendations for Other Services       Precautions / Restrictions Precautions Precautions: Fall Restrictions Weight Bearing Restrictions: No     Mobility   Bed Mobility               General bed mobility comments: pt OOB    Transfers Overall transfer level: Needs assistance Equipment used: Rolling walker (2 wheels) Transfers: Sit to/from Stand Sit to Stand: Supervision           General transfer comment: worked on sit>stand from various height surfaces with and without use of UE's, supervision for safety    Ambulation/Gait Ambulation/Gait assistance: Min guard, Min assist Gait Distance (Feet): 350 Feet Assistive device: Rolling walker (2 wheels) Gait Pattern/deviations: Step-through pattern, Decreased stride length, Trunk flexed, Staggering left, Decreased weight shift to left Gait velocity: reduced Gait velocity interpretation: 1.31 - 2.62 ft/sec, indicative of limited community ambulator   General Gait Details: no knee buckling today. L lean 3x with changes in direction, min A to correct. Worked on increasing pace   Stairs Stairs: Yes Stairs assistance: Supervision Stair Management: Two rails, Alternating pattern, Forwards Number of Stairs: 5 General stair comments: pt able to maintain alternating pattern but increased reliance on rails when stepping down with R   Wheelchair Mobility    Modified Rankin (Stroke Patients Only)       Balance Overall balance assessment: Needs assistance Sitting-balance support: No upper extremity supported, Feet supported Sitting balance-Leahy Scale: Good Sitting balance - Comments: pt able to reach to floor without LOB   Standing balance support: During functional activity, No upper extremity supported Standing balance-Leahy Scale: Fair Standing balance comment: pt able to reach to floor in standing without LOB                            Cognition Arousal/Alertness: Awake/alert Behavior During Therapy: WFL for tasks assessed/performed Overall Cognitive Status: No family/caregiver present  to determine baseline cognitive functioning                                  General Comments: slow processing but able to relay questions when he had them. When talking about smoking cessation for d/c he asked about a patch which is very relevant for him        Exercises Other Exercises Other Exercises: sit <> stand from chair 10x with hands on knees Other Exercises: unilateral stance with one foot on rolling stool pushing stool in and out, 10x each LE, min A and UE needed.    General Comments General comments (skin integrity, edema, etc.): discussed safety upon d/c and set RW at appropriate height to take home.      Pertinent Vitals/Pain Pain Assessment Pain Assessment: No/denies pain    Home Living                          Prior Function            PT Goals (current goals can now be found in the care plan section) Acute Rehab PT Goals Patient Stated Goal: to improve PT Goal Formulation: With patient Time For Goal Achievement: 08/28/22 Potential to Achieve Goals: Good Progress towards PT goals: Progressing toward goals    Frequency    Min 3X/week      PT Plan Current plan remains appropriate    Co-evaluation              AM-PAC PT "6 Clicks" Mobility   Outcome Measure  Help needed turning from your back to your side while in a flat bed without using bedrails?: None Help needed moving from lying on your back to sitting on the side of a flat bed without using bedrails?: A Little Help needed moving to and from a bed to a chair (including a wheelchair)?: A Little Help needed standing up from a chair using your arms (e.g., wheelchair or bedside chair)?: A Little Help needed to walk in hospital room?: A Little Help needed climbing 3-5 steps with a railing? : A Little 6 Click Score: 19    End of Session Equipment Utilized During Treatment: Gait belt Activity Tolerance: Patient tolerated treatment well Patient left: with call bell/phone within reach;with chair alarm set;in chair Nurse Communication:  Mobility status;Other (comment) (pt requesting nicotine patch) PT Visit Diagnosis: Unsteadiness on feet (R26.81);Other abnormalities of gait and mobility (R26.89);Muscle weakness (generalized) (M62.81);History of falling (Z91.81);Difficulty in walking, not elsewhere classified (R26.2)     Time: 4098-1191 PT Time Calculation (min) (ACUTE ONLY): 18 min  Charges:  $Gait Training: 8-22 mins                     Lyanne Co, PT  Acute Rehab Services Secure chat preferred Office [phone]    Lawana Chambers Aemilia Dedrick 08/16/2022, 1:42 PM

## 2022-08-16 NOTE — TOC Transition Note (Signed)
Transition of Care [facility]) - CM/SW Discharge Note   Patient Details  Name: Gregory Nunez. MRN: [id number] Date of Birth: 1946/02/20  Transition of Care [facility]) CM/SW Contact:  Gregory Clement, RN Phone Number: 08/16/2022, 1:02 PM   Clinical Narrative:    CM met with Patient bedside to complete IA . Patient was sitting on side of the bed eating lunch but invited me to stay.  Patient states he lives alone in his apartment but that his [employer], Gregory Nunez helps him a lot. Meals come from a "meals on wheels" like company, patient stated ;3 days a week.  The other days he has help from family. Patient will receive HH PT and OT from [facility] ([facility] and used previously) . A rolling walker and a shower chair have been ordered and will be delivered bedside by [employer] . Gregory Nunez will transport home.    No additional TOC needs anticipated    Final next level of care: Home w Home Health Services Barriers to Discharge: No Barriers Identified   Patient Goals and CMS Choice CMS Medicare.gov Compare Post Acute Care list provided to:: Patient Represenative (must comment) Choice offered to / list presented to : Patient  Discharge Placement                         Discharge Plan and Services Additional resources added to the After Visit Summary for   In-house Referral: NA Discharge Planning Services: CM Consult Post Acute Care Choice: Durable Medical Equipment, Home Health          DME Arranged: Walker rolling, Shower stool DME Agency: [employer] Date DME Agency Contacted: 08/16/22 Time DME Agency Contacted: [phone] Representative spoke with at DME Agency: Gregory Nunez HH Arranged: PT, OT HH Agency: [facility] Date [facility] Agency Contacted: 08/16/22 Time HH Agency Contacted: 1300 Representative spoke with at [facility] Agency: Gregory Nunez  Social Determinants of Health (SDOH) Interventions SDOH Screenings   Food Insecurity: No Food Insecurity (08/13/2022)  Housing:  Low Risk  (08/13/2022)  Transportation Needs: Unmet Transportation Needs (08/13/2022)  Utilities: Not At Risk (08/13/2022)  Depression (PHQ2-9): Low Risk  (12/04/2021)  Tobacco Use: High Risk (08/13/2022)     Readmission Risk Interventions     No data to display

## 2022-08-17 LAB — ANCA TITERS
Atypical P-ANCA titer: <1:20 {titer}
C-ANCA: <1:20 {titer}
P-ANCA: <1:20 {titer}

## 2022-08-17 LAB — PROTEIN ELECTROPHORESIS, SERUM
A/G Ratio: 0.7 (ref 0.7–1.7)
Albumin ELP: 2.8 g/dL — ABNORMAL LOW (ref 2.9–4.4)
Alpha-1-Globulin: 0.3 g/dL (ref 0.0–0.4)
Alpha-2-Globulin: 1 g/dL (ref 0.4–1.0)
Beta Globulin: 1.1 g/dL (ref 0.7–1.3)
Gamma Globulin: 1.8 g/dL (ref 0.4–1.8)
Globulin, Total: 4.2 g/dL — ABNORMAL HIGH (ref 2.2–3.9)
Total Protein ELP: 7 g/dL (ref 6.0–8.5)

## 2022-08-18 LAB — PROTEIN ELECTRO, RANDOM URINE
Albumin ELP, Urine: 59.2 %
Alpha-1-Globulin, U: 3.8 %
Alpha-2-Globulin, U: 6.7 %
Beta Globulin, U: 11.8 %
Gamma Globulin, U: 18.5 %
Total Protein, Urine: 778.2 mg/dL

## 2022-08-18 LAB — ANTINUCLEAR ANTIBODIES, IFA: ANA Ab, IFA: NEGATIVE

## 2022-08-18 LAB — HCV RNA QUANT RFLX ULTRA OR GENOTYP
HCV RNA Qnt(log copy/mL): 3.548 log10 IU/mL
HepC Qn: 3530 IU/mL

## 2022-08-18 LAB — HEPATITIS C GENOTYPE

## 2022-08-20 ENCOUNTER — Telehealth: Payer: Self-pay | Admitting: *Deleted

## 2022-08-20 NOTE — Telephone Encounter (Signed)
I concur

## 2022-08-20 NOTE — Telephone Encounter (Signed)
Call from [place], PT with [facility]. Requesting PT 1 time a week for 9 weeks for weakness.  Tresa Endo can be reached at [phone].   Will forward message to PCP and Team for confirmation of the VO.

## 2022-08-20 NOTE — Telephone Encounter (Signed)
RTC to Remer message left on Voice Mail that the Verbal orders have been approved.

## 2022-08-23 ENCOUNTER — Encounter: Payer: 59 | Admitting: Student

## 2022-08-23 NOTE — Progress Notes (Deleted)
Admitted 4/26 - 4/29 for severe symptomatic hypertension Losartan 50, amlodipine 5, hydralazine 25 3 times daily Nephrotic range proteinuria-nephrology, repeat BMP.  Protein creatinine ratio 3.94, ANA/ANCA/complements negative, low albumin and elevated globulin on SPEP, kappa lambda ratio 2.8, hep C 1B, quant 3.548.  UPEP albumin predominant no M component Of T2DM-A1c 5.8 on 08/14/2022, 7.1 in 11/2021 HLD-lipids 08/13/2022, LDL 92, atorvastatin 40 mg daily and aspirin 81 mg daily.  ASCVD risk 9.9% for 10 years.  LDL goal less than 70? 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

## 2022-08-24 IMAGING — MR MR HEAD W/O CM
10 series · 42 of 48 positions shown · non-contrast
Comparison: Head CT 04/12/2021.

CLINICAL DATA: Provided history: Delirium. Additional history
provided: Hypoglycemia, delayed speech, confusion.

EXAM:
MRI HEAD WITHOUT CONTRAST
TECHNIQUE: Multiplanar, multiecho pulse sequences of the brain and surrounding
structures were obtained without intravenous contrast.

[Series 5: dwi_tracew · axial · 3.0mm · 1.08mm/px · z∈[-58,+92]mm · 8 of 102 slices shown]
[im 1/102]
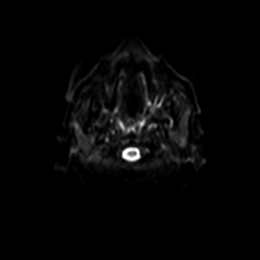
[im 19/102]
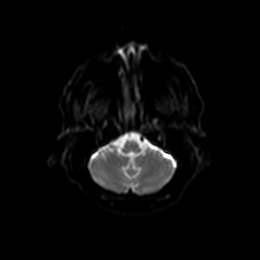
[im 28/102]
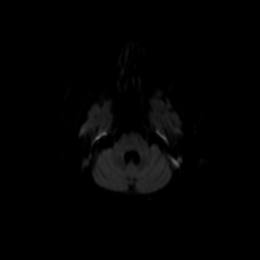
[im 46/102]
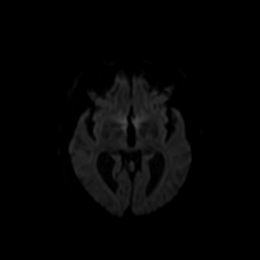
[im 56/102]
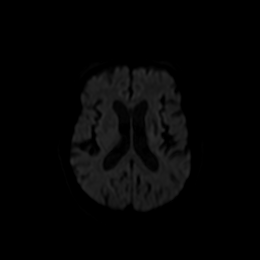
[im 74/102]
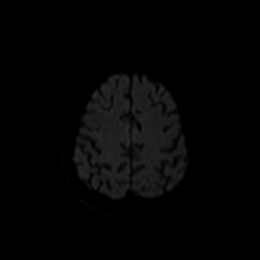
[im 83/102]
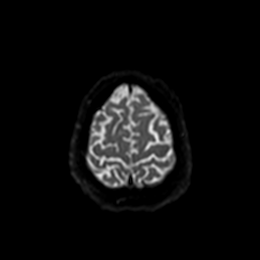
[im 102/102]
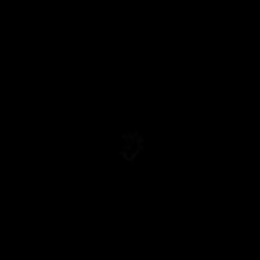

[Series 6: dwi_adc · axial · 3.0mm · 1.08mm/px · z∈[-58,+17]mm · 3 of 51 slices shown]
[im 1/51]
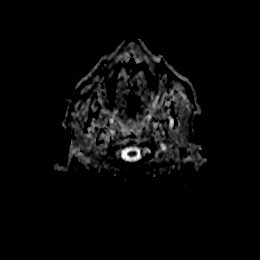
[im 13/51]
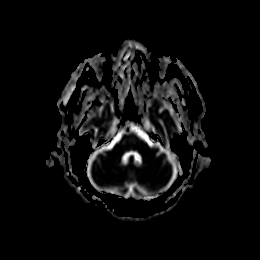
[im 26/51]
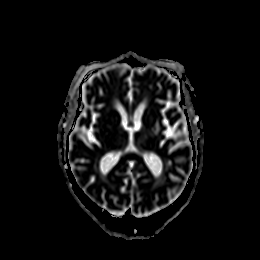

[Series 7: T2 · sagittal · 5.0mm · 0.47mm/px · 2 of 24 slices shown (1 of 3)]
[im 1/24]
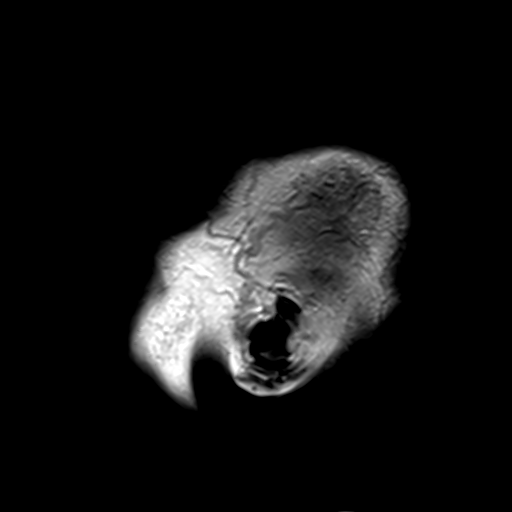
[im 24/24]
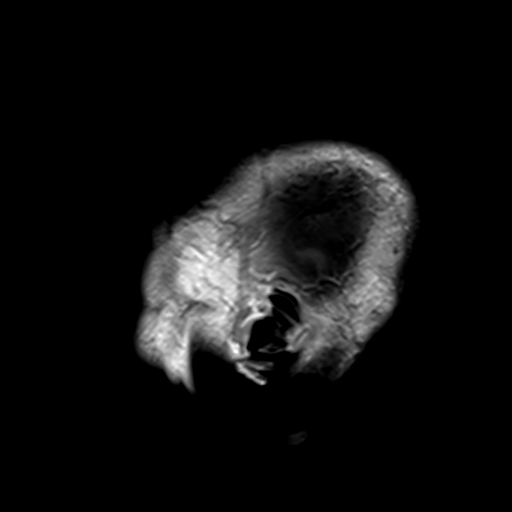

[Series 8: T2 · axial · 5.0mm · 0.45mm/px · z∈[-64,+85]mm · 2 of 24 slices shown (2 of 3)]
[im 1/24]
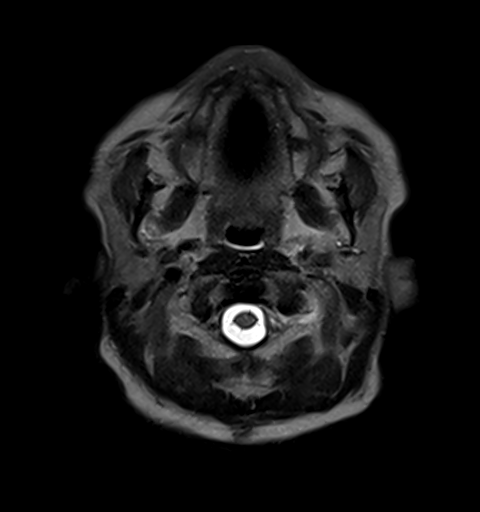
[im 24/24]
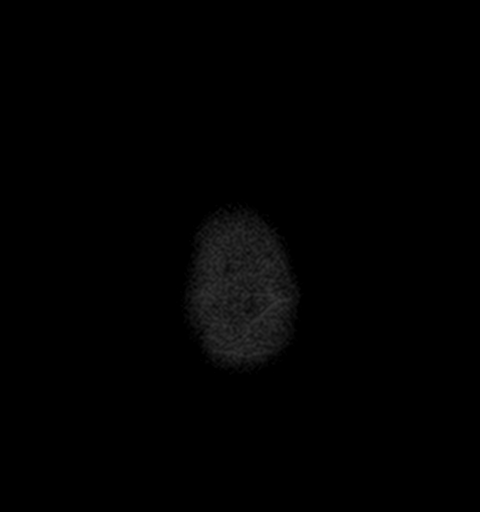

[Series 9: [name] · axial · 3.0mm · 0.45mm/px · z∈[-62,+88]mm · 5 of 51 slices shown]
[im 1/51]
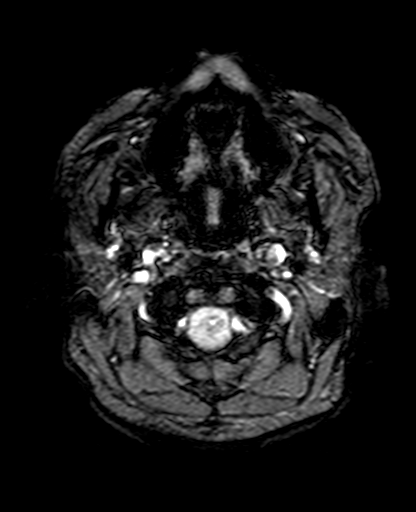
[im 13/51]
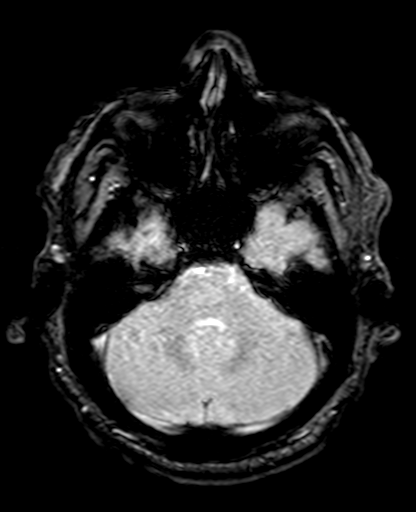
[im 26/51]
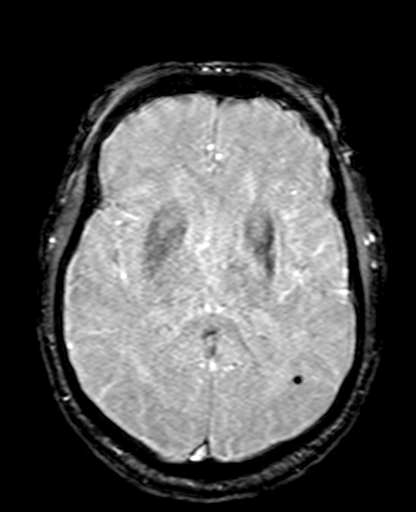
[im 38/51]
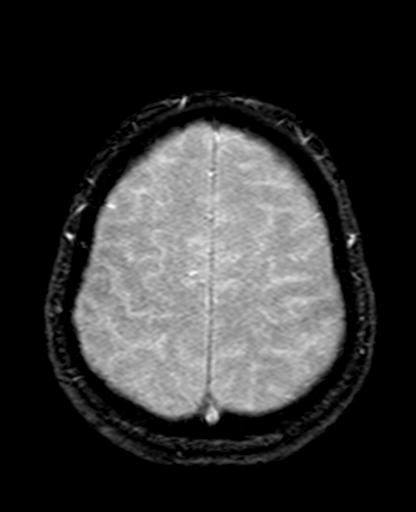
[im 51/51]
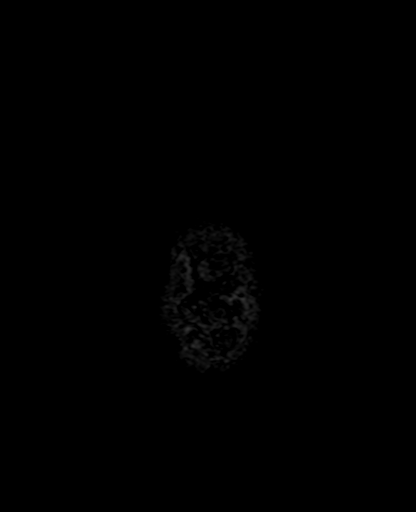

[Series 10: FLAIR · axial · 3.0mm · 0.86mm/px · z∈[-65,+84]mm · 5 of 51 slices shown]
[im 1/51]
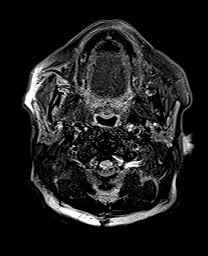
[im 13/51]
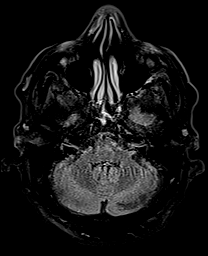
[im 26/51]
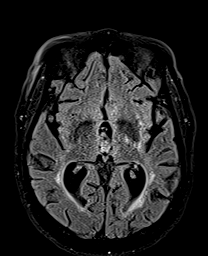
[im 38/51]
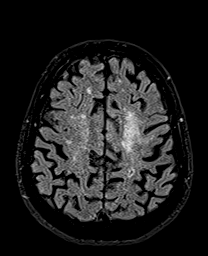
[im 51/51]
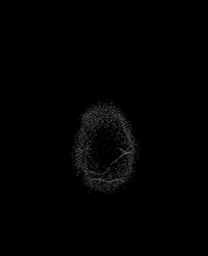

[Series 11: T1 · axial · 3.0mm · 0.45mm/px · z∈[-62,+88]mm · 5 of 51 slices shown]
[im 1/51]
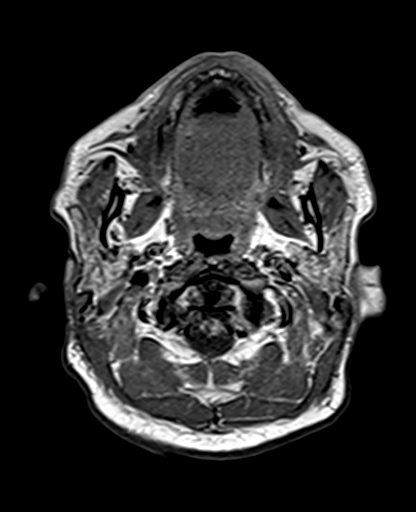
[im 13/51]
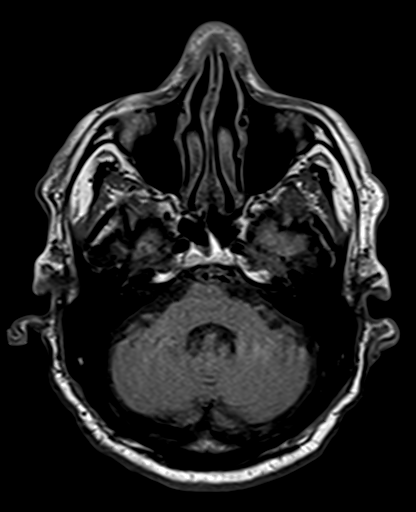
[im 26/51]
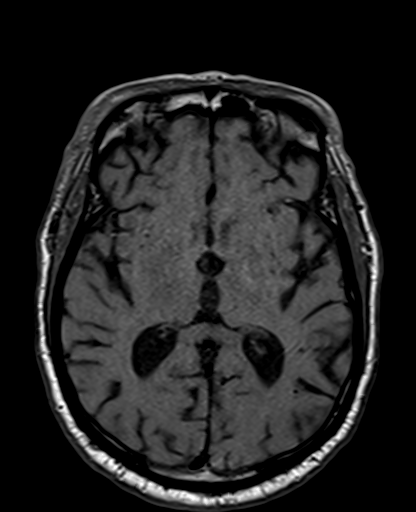
[im 38/51]
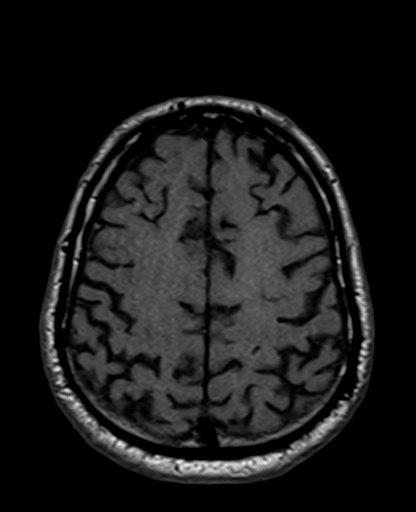
[im 51/51]
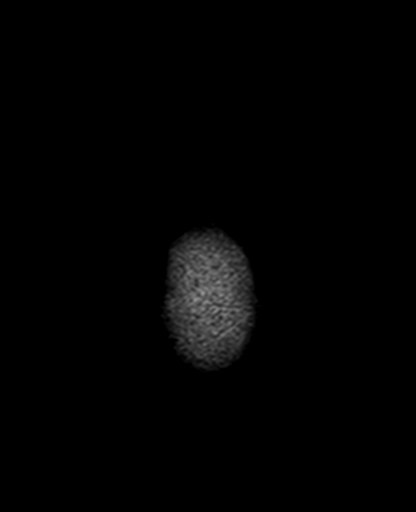

[Series 12: DWI · coronal · 5.0mm · 1.31mm/px · 6 of 56 slices shown (1 of 2)]
[im 1/56]
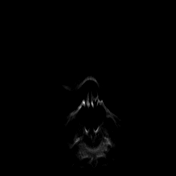
[im 12/56]
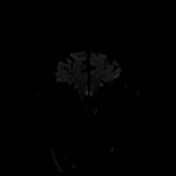
[im 23/56]
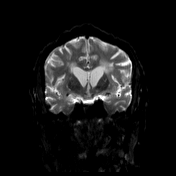
[im 34/56]
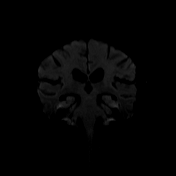
[im 45/56]
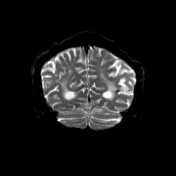
[im 56/56]
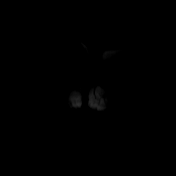

[Series 13: DWI · coronal · 5.0mm · 1.31mm/px · 3 of 28 slices shown (2 of 2)]
[im 1/28]
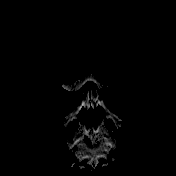
[im 14/28]
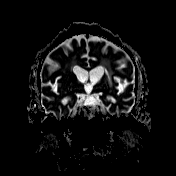
[im 28/28]
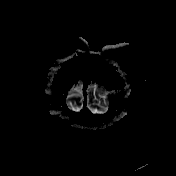

[Series 14: T2 · coronal · 5.0mm · 0.86mm/px · 3 of 28 slices shown (3 of 3)]
[im 1/28]
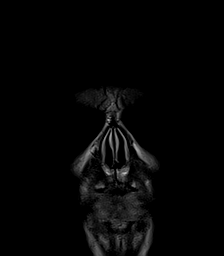
[im 14/28]
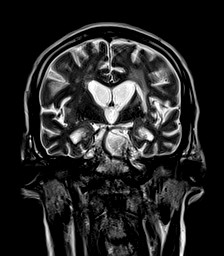
[im 28/28]
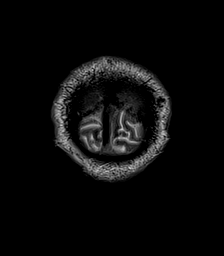

[42 of 48 positions shown; findings below may reference images not displayed]

FINDINGS: Brain:

Mild intermittent motion degradation.

Mild generalized cerebral and cerebellar atrophy.

Redemonstrated chronic small-vessel infarcts within the bilateral
cerebral hemispheric white matter and left basal ganglia/posterior
limb of left internal capsule. A chronic lacunar infarct within the
high left parietal lobe white matter is new as compared to the prior
MRI of 10/07/2020. Associated wallerian degeneration within the left
cerebral peduncle.

Background advanced patchy and ill-defined hypoattenuation within
the cerebral white matter, nonspecific but compatible chronic small
vessel ischemic disease.

Redemonstrated chronic microhemorrhages within the left frontal and
parietal lobes.

Redemonstrated chronic lacunar infarcts within the bilateral pons
with background pontine chronic small vessel ischemic changes.

There is no acute infarct.

No evidence of an intracranial mass.

No extra-axial fluid collection.

No midline shift.

Vascular: Maintained flow voids within the proximal large arterial
vessels.

Skull and upper cervical spine: No focal suspicious marrow lesion.
Incompletely assessed cervical spondylosis.

Sinuses/Orbits: Visualized orbits show no acute finding. Bilateral
lens replacements. Mild mucosal thickening within the bilateral
ethmoid sinuses.
IMPRESSION: Mildly motion degraded exam.

No evidence of acute intracranial abnormality.

Parenchymal atrophy, advanced chronic small vessel ischemic disease
and multiple chronic small-vessel infarcts, as outlined. A chronic
small-vessel infarct within the high left parietal lobe white matter
is new from the brain MRI of 10/07/2020. These findings are
otherwise unchanged from the prior MRI.

Mild paranasal sinus disease, as described.

## 2022-09-02 ENCOUNTER — Other Ambulatory Visit: Payer: Self-pay

## 2022-09-02 ENCOUNTER — Emergency Department (HOSPITAL_COMMUNITY)
Admission: EM | Admit: 2022-09-02 | Discharge: 2022-09-02 | Disposition: A | Discharge status: Home / Self Care | Payer: 59 | Attending: Emergency Medicine | Admitting: Emergency Medicine

## 2022-09-02 ENCOUNTER — Encounter (HOSPITAL_COMMUNITY): Payer: Self-pay

## 2022-09-02 DIAGNOSIS — E119 Type 2 diabetes mellitus without complications: Secondary | ICD-10-CM | POA: Diagnosis not present

## 2022-09-02 DIAGNOSIS — I10 Essential (primary) hypertension: Secondary | ICD-10-CM | POA: Insufficient documentation

## 2022-09-02 DIAGNOSIS — Z79899 Other long term (current) drug therapy: Secondary | ICD-10-CM | POA: Diagnosis not present

## 2022-09-02 DIAGNOSIS — F17210 Nicotine dependence, cigarettes, uncomplicated: Secondary | ICD-10-CM | POA: Insufficient documentation

## 2022-09-02 DIAGNOSIS — R040 Epistaxis: Secondary | ICD-10-CM | POA: Insufficient documentation

## 2022-09-02 NOTE — ED Provider Notes (Signed)
[place] [facility] AT [facility] Provider Note   CSN: [id number] Arrival date & time: 09/02/22  1412     History  Chief Complaint  Patient presents with   Epistaxis    Bentlee Packett Harriet Well. is a 77 y.o. male.   Epistaxis   77 year old male presents emergency department complaints of left-sided nosebleed.  Patient states the nosebleed began today after picking at nose.  Initially reported to emergency department with active bleeding but bleeding has since stopped while waiting for provider.  Patient denies anticoagulation use.   Past medical history significant for hypertension, GERD, diabetes mellitus, CVA, malignancy, alcohol withdrawal seizure, hyperlipidemia,  Home Medications Prior to Admission medications   Medication Sig Start Date End Date Taking? Authorizing Provider  amLODipine (NORVASC) 5 MG tablet Take 1 tablet (5 mg total) by mouth daily. 08/17/22   Atway, Rayann N, DO  atorvastatin (LIPITOR) 40 MG tablet Take 1 tablet (40 mg total) by mouth daily. 08/16/22   Atway, Rayann N, DO  hydrALAZINE (APRESOLINE) 25 MG tablet Take 1 tablet (25 mg total) by mouth every 8 (eight) hours. 08/16/22   Atway, Rayann N, DO  iron polysaccharides (NIFEREX) 150 MG capsule Take 1 capsule (150 mg total) by mouth daily. 08/17/22   Atway, Rayann N, DO  losartan (COZAAR) 50 MG tablet Take 1 tablet (50 mg total) by mouth daily. 08/16/22   Atway, Rayann N, DO  COMBIVENT RESPIMAT 20-100 MCG/ACT AERS respimat Inhale 1 puff into the lungs every 6 (six) hours as needed for shortness of breath. 06/12/18 08/19/19  [provider]  mometasone-formoterol (DULERA) 100-5 MCG/ACT AERO Inhale 2 puffs into the lungs daily. Patient not taking: Reported on 08/19/2019 04/17/18 08/19/19  Berton Mount I, MD  sucralfate (CARAFATE) 1 g tablet Take 1 tablet (1 g total) by mouth 4 (four) times daily. Patient not taking: Reported on 08/19/2019 04/17/18 08/19/19  Berton Mount I, MD   tiotropium (SPIRIVA HANDIHALER) 18 MCG inhalation capsule Place 1 capsule (18 mcg total) into inhaler and inhale daily. Patient not taking: Reported on 08/19/2019 04/17/18 08/19/19  Berton Mount I, MD  traZODone (DESYREL) 50 MG tablet Take 50 mg by mouth at bedtime as needed for sleep. Patient not taking: Reported on 10/06/2020  10/08/20  [provider]      Allergies    Penicillins    Review of Systems   Review of Systems  HENT:  Positive for nosebleeds.   All other systems reviewed and are negative.   Physical Exam Updated Vital Signs BP (!) 157/88 (BP Location: Left Arm)   Pulse 91   Temp 98.2 F (36.8 C) (Oral)   Resp 18   Ht 5\' 11"  (1.803 m)   Wt 75.8 kg   SpO2 100%   BMI 23.31 kg/m  Physical Exam Vitals and nursing note reviewed.  Constitutional:      General: He is not in acute distress.    Appearance: He is well-developed.  HENT:     Head: Normocephalic and atraumatic.     Nose:     Comments: Dried blood appreciated at external left nare.  No active bleeding appreciated. Eyes:     Conjunctiva/sclera: Conjunctivae normal.  Cardiovascular:     Rate and Rhythm: Normal rate and regular rhythm.     Heart sounds: No murmur heard. Pulmonary:     Effort: Pulmonary effort is normal. No respiratory distress.     Breath sounds: Normal breath sounds.  Abdominal:  Palpations: Abdomen is soft.     Tenderness: There is no abdominal tenderness.  Musculoskeletal:        General: No swelling.     Cervical back: Neck supple.  Skin:    General: Skin is warm and dry.     Capillary Refill: Capillary refill takes less than 2 seconds.  Neurological:     Mental Status: He is alert.  Psychiatric:        Mood and Affect: Mood normal.     ED Results / Procedures / Treatments   Labs (all labs ordered are listed, but only abnormal results are displayed) Labs Reviewed - No data to display  EKG None  Radiology No results found.  Procedures Procedures     Medications Ordered in ED Medications - No data to display  ED Course/ Medical Decision Making/ A&P                             Medical Decision Making  This patient presents to the ED for concern of epistaxis, this involves an extensive number of treatment options, and is a complaint that carries with it a high risk of complications and morbidity.  The differential diagnosis includes epistaxis   Co morbidities that complicate the patient evaluation  See HPI   Additional history obtained:  Additional history obtained from EMR External records from outside source obtained and reviewed including hospital records   Lab Tests:  N/a   Imaging Studies ordered:  N/a   Cardiac Monitoring: / EKG:  The patient was maintained on a cardiac monitor.  I personally viewed and interpreted the cardiac monitored which showed an underlying rhythm of: Sinus rhythm   Consultations Obtained:  N/a   Problem List / ED Course / Critical interventions / Medication management  Epistaxis Reevaluation of the patient showed that the patient improved I have reviewed the patients home medicines and have made adjustments as needed   Social Determinants of Health:  Chronic cigarette use.  Denies illicit drug use.   Test / Admission - Considered:  Epistaxis Vitals signs significant for mild hypertension with blood pressure 157/88. Otherwise within normal range and stable throughout visit. 77 year old male presents emergency department with complaints of left-sided epistaxis after picking the nose.  While waiting to be seen by provider, nosebleed resolved spontaneously.  No evidence of active bleeding upon repeat examination.  Most likely cause of patient's nosebleed secondary to traumatic cause finger.  Will recommend cessation of picking at nose.  Will give ENT information for follow-up for potential recurrent nosebleeds.  Treatment plan discussed at length with patient and he  acknowledged understanding was agreeable to said plan.  Patient overall well-appearing, afebrile in no acute distress. Worrisome signs and symptoms were discussed with the patient, and the patient acknowledged understanding to return to the ED if noticed. Patient was stable upon discharge.          Final Clinical Impression(s) / ED Diagnoses Final diagnoses:  Left-sided epistaxis    Rx / DC Orders ED Discharge Orders     None         Peter Garter, [place] 09/02/22 2232    Sloan Leiter, DO 09/03/22 [phone]

## 2022-09-02 NOTE — Discharge Instructions (Addendum)
The workup today was overall reassuring.  As discussed, please do not pick at nose.  Keep upcoming appointment with ear nose and throat specialist on the 28th for reevaluation.  Please do not hesitate to return to emergency department for worrisome signs and symptoms we discussed become apparent.

## 2022-09-02 NOTE — ED Triage Notes (Signed)
Pt arrived via [facility] from home for epistaxis for last 30 mins. Pt in not currently having epistaxis. Pt denies blood thinners or any other meds.

## 2022-09-21 ENCOUNTER — Encounter: Payer: 59 | Admitting: Student

## 2022-09-22 NOTE — Progress Notes (Signed)
As patient missed his appointment, will call again to see if we can get him rescheduled for a hospital f/u visit.

## 2022-09-22 NOTE — Progress Notes (Signed)
Unfortunately, patient did not show up for the appt.

## 2022-09-23 NOTE — Progress Notes (Signed)
Unable to get a hold of the patient directly, however, was able to reach his nephew. He is aware the patient missed his hospital follow up visit and agreed to help [place] get him scheduled for another visit. Messaged the front desk to help him get this visit scheduled.

## 2022-09-24 ENCOUNTER — Telehealth: Payer: Self-pay | Admitting: Internal Medicine

## 2022-09-24 NOTE — Telephone Encounter (Signed)
Last OV 12/04/21 with 3 month return request  Since then patient has no showed 4 Provider appts and cancelled 1.

## 2022-09-24 NOTE — Telephone Encounter (Signed)
Margarita Mail from [facility] is calling about missed appointment with patient.  Waited 30 minutes at patient's home with aide.  He was a no show.

## 2022-09-28 ENCOUNTER — Telehealth: Payer: Self-pay | Admitting: Internal Medicine

## 2022-09-28 NOTE — Telephone Encounter (Signed)
Please refer to message below.  Just spoke with patient's nephew, Casimiro Needle.  He did agree to bring the patient in for an appointment next week on 10/05/22 at 3:45 pm with Dr. Kirke Corin.

## 2022-09-28 NOTE — Telephone Encounter (Signed)
-----   Message from Karoline Caldwell, MD sent at 09/23/2022 11:22 AM EDT ----- Good afternoon,  Could we schedule this patient for a hospital follow up appointment in the next 1-2 weeks. This patient has missed his last 2 appointments but I was able to get a hold of his nephew Jakiah Goree (at [phone]) who agreed to schedule another appointment for him. Thank you for your time.  Best wishes,  Karoline Caldwell

## 2022-10-05 ENCOUNTER — Encounter: Payer: 59 | Admitting: Student

## 2022-10-14 ENCOUNTER — Encounter: Payer: Self-pay | Admitting: Student

## 2022-10-14 ENCOUNTER — Ambulatory Visit (INDEPENDENT_AMBULATORY_CARE_PROVIDER_SITE_OTHER): Payer: 59

## 2022-10-14 ENCOUNTER — Other Ambulatory Visit: Payer: Self-pay

## 2022-10-14 ENCOUNTER — Ambulatory Visit (INDEPENDENT_AMBULATORY_CARE_PROVIDER_SITE_OTHER): Payer: 59 | Admitting: Student

## 2022-10-14 VITALS — BP 173/89 | HR 83 | Temp 97.7°F | Ht 71.0 in | Wt 159.7 lb

## 2022-10-14 DIAGNOSIS — N184 Chronic kidney disease, stage 4 (severe): Secondary | ICD-10-CM

## 2022-10-14 DIAGNOSIS — Z0000 Encounter for general adult medical examination without abnormal findings: Secondary | ICD-10-CM

## 2022-10-14 DIAGNOSIS — D509 Iron deficiency anemia, unspecified: Secondary | ICD-10-CM

## 2022-10-14 DIAGNOSIS — B1920 Unspecified viral hepatitis C without hepatic coma: Secondary | ICD-10-CM | POA: Diagnosis not present

## 2022-10-14 DIAGNOSIS — E1169 Type 2 diabetes mellitus with other specified complication: Secondary | ICD-10-CM

## 2022-10-14 DIAGNOSIS — I129 Hypertensive chronic kidney disease with stage 1 through stage 4 chronic kidney disease, or unspecified chronic kidney disease: Secondary | ICD-10-CM

## 2022-10-14 DIAGNOSIS — I152 Hypertension secondary to endocrine disorders: Secondary | ICD-10-CM

## 2022-10-14 DIAGNOSIS — E1122 Type 2 diabetes mellitus with diabetic chronic kidney disease: Secondary | ICD-10-CM

## 2022-10-14 DIAGNOSIS — F17210 Nicotine dependence, cigarettes, uncomplicated: Secondary | ICD-10-CM

## 2022-10-14 DIAGNOSIS — Z8673 Personal history of transient ischemic attack (TIA), and cerebral infarction without residual deficits: Secondary | ICD-10-CM

## 2022-10-14 DIAGNOSIS — B182 Chronic viral hepatitis C: Secondary | ICD-10-CM

## 2022-10-14 DIAGNOSIS — E118 Type 2 diabetes mellitus with unspecified complications: Secondary | ICD-10-CM

## 2022-10-14 DIAGNOSIS — N1831 Chronic kidney disease, stage 3a: Secondary | ICD-10-CM

## 2022-10-14 DIAGNOSIS — I10 Essential (primary) hypertension: Secondary | ICD-10-CM

## 2022-10-14 MED ORDER — HYDRALAZINE HCL 25 MG PO TABS: 25.0000 mg | ORAL_TABLET | Freq: Three times a day (TID) | ORAL | 3 refills | Status: AC

## 2022-10-14 MED ORDER — LOSARTAN POTASSIUM 50 MG PO TABS
50.0000 mg | ORAL_TABLET | Freq: Every day | ORAL | 3 refills | Status: DC
Start: 2022-10-14 — End: 2023-06-20

## 2022-10-14 MED ORDER — ATORVASTATIN CALCIUM 40 MG PO TABS: 40.0000 mg | ORAL_TABLET | Freq: Every day | ORAL | 3 refills | Status: DC

## 2022-10-14 MED ORDER — AMLODIPINE BESYLATE 5 MG PO TABS: 5.0000 mg | ORAL_TABLET | Freq: Every day | ORAL | 3 refills | Status: DC

## 2022-10-14 NOTE — Assessment & Plan Note (Signed)
Patient has had a history of proteinuria since 2013. During his recent hospitalization, he was found to have nephrotic range proteinuria and acute on chronic kidney disease. Workup during the hospitalization for possible glomerulonephritis reviewed normal complements, ANCA, ANA and protein electrophoresis. Renal ultrasound was unremarkable.  Nephrology was consulted for evaluation and possible kidney biopsy. 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 Patient was unable to follow-up with nephrology due to lack of transportation.  On discharge, his creatinine was 2.83, up from 2.26 on day of admission. 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  Plan: -Advised his nephew to call nephrology for appointment -Check renal function, protein/creatinine ratio

## 2022-10-14 NOTE — Assessment & Plan Note (Signed)
Well-controlled with A1c of 5.8% 2 months ago. He is currently not on any antidiabetic medications. Continue to monitor.

## 2022-10-14 NOTE — Progress Notes (Signed)
Subjective:   Gregory Nunez. is a 77 y.o. male who presents for an Initial Medicare Annual Wellness Visit.  Visit Complete: In person  Review of Systems    DEFERRED TO PCP  Cardiac Risk Factors include: advanced age (>32men, >1 women);diabetes mellitus;hypertension;male gender;smoking/ tobacco exposure     Objective:    Today's Vitals   10/14/22 0924  PainSc: 0-No pain   There is no height or weight on file to calculate BMI.     10/14/2022    9:12 AM 10/14/2022    9:11 AM 08/13/2022   11:00 AM 01/11/2022   12:24 PM 12/04/2021    9:22 AM 06/08/2021    2:59 PM 04/13/2021   12:00 AM  Advanced Directives  Does Patient Have a Medical Advance Directive? No No No No No No No  Would patient like information on creating a medical advance directive? Yes (ED - Information included in AVS) Yes (ED - Information included in AVS) No - Patient declined No - Patient declined No - Patient declined No - Guardian declined No - Patient declined    Current Medications (verified) Outpatient Encounter Medications as of 10/14/2022  Medication Sig   amLODipine (NORVASC) 5 MG tablet Take 1 tablet (5 mg total) by mouth daily.   atorvastatin (LIPITOR) 40 MG tablet Take 1 tablet (40 mg total) by mouth daily.   hydrALAZINE (APRESOLINE) 25 MG tablet Take 1 tablet (25 mg total) by mouth every 8 (eight) hours.   iron polysaccharides (NIFEREX) 150 MG capsule Take 1 capsule (150 mg total) by mouth daily.   losartan (COZAAR) 50 MG tablet Take 1 tablet (50 mg total) by mouth daily.   [DISCONTINUED] COMBIVENT RESPIMAT 20-100 MCG/ACT AERS respimat Inhale 1 puff into the lungs every 6 (six) hours as needed for shortness of breath.   [DISCONTINUED] mometasone-formoterol (DULERA) 100-5 MCG/ACT AERO Inhale 2 puffs into the lungs daily. (Patient not taking: Reported on 08/19/2019)   [DISCONTINUED] sucralfate (CARAFATE) 1 g tablet Take 1 tablet (1 g total) by mouth 4 (four) times daily. (Patient not taking:  Reported on 08/19/2019)   [DISCONTINUED] tiotropium (SPIRIVA HANDIHALER) 18 MCG inhalation capsule Place 1 capsule (18 mcg total) into inhaler and inhale daily. (Patient not taking: Reported on 08/19/2019)   [DISCONTINUED] traZODone (DESYREL) 50 MG tablet Take 50 mg by mouth at bedtime as needed for sleep. (Patient not taking: Reported on 10/06/2020)   No facility-administered encounter medications on file as of 10/14/2022.    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   Patient is approximately 5-week status post bunion correction left foot   Hyperlipidemia    Hypertension    Hypertensive retinopathy    OU   Hypoglycemia 10/22/2020   Malignant neoplasm of prostate (HCC) 01/09/2014   Neuropathy    Pancreatitis    Pneumococcal vaccination administered at current visit 11/10/2020   Prostate cancer (HCC) 12/19/2013  Gleason 4+3=7, volume 31.31 cc   Rhabdomyolysis 04/12/2021   Sciatica    Shortness of breath dyspnea    with exertion    Past Surgical History:  Procedure Laterality Date   BIOPSY  04/16/2018   Procedure: BIOPSY;  Surgeon: Charna Elizabeth, MD;  Location: [facility];  Service: Endoscopy;;   BIOPSY  05/23/2020   Procedure: BIOPSY;  Surgeon: Lynann Bologna, MD;  Location: [facility];  Service: Gastroenterology;;  EGD and COLON   BIOPSY  12/09/2020   Procedure: BIOPSY;  Surgeon: Tressia Danas, MD;  Location:  [facility] ENDOSCOPY;  Service: Gastroenterology;;   biopsy on throat     hx of    CATARACT EXTRACTION Bilateral    Dr. Baker Pierini   COLONOSCOPY N/A 05/23/2020   Procedure: COLONOSCOPY;  Surgeon: Lynann Bologna, MD;  Location: [facility];  Service: Gastroenterology;  Laterality: N/A;   ENTEROSCOPY N/A 10/07/2020   Procedure: ENTEROSCOPY;  Surgeon: Iva Boop, MD;  Location: [facility] ENDOSCOPY;  Service: Endoscopy;  Laterality: N/A;   ESOPHAGOGASTRODUODENOSCOPY Left 04/16/2018   Procedure: ESOPHAGOGASTRODUODENOSCOPY (EGD);  Surgeon: Charna Elizabeth, MD;  Location: [facility] ENDOSCOPY;  Service: Endoscopy;  Laterality: Left;   ESOPHAGOGASTRODUODENOSCOPY (EGD) WITH PROPOFOL N/A 05/23/2020   Procedure: ESOPHAGOGASTRODUODENOSCOPY (EGD) WITH PROPOFOL;  Surgeon: Lynann Bologna, MD;  Location: [facility];  Service: Gastroenterology;  Laterality: N/A;   ESOPHAGOGASTRODUODENOSCOPY (EGD) WITH PROPOFOL N/A 12/09/2020   Procedure: ESOPHAGOGASTRODUODENOSCOPY (EGD) WITH PROPOFOL;  Surgeon: Tressia Danas, MD;  Location: [facility] ENDOSCOPY;  Service: Gastroenterology;  Laterality: N/A;   EYE SURGERY     FOOT SURGERY     HOT HEMOSTASIS N/A 04/16/2018   Procedure: HOT HEMOSTASIS (ARGON PLASMA COAGULATION/BICAP);  Surgeon: Charna Elizabeth, MD;  Location: [facility] ENDOSCOPY;  Service: Endoscopy;  Laterality: N/A;   HOT HEMOSTASIS N/A 05/23/2020   Procedure: HOT HEMOSTASIS (ARGON PLASMA COAGULATION/BICAP);  Surgeon: Lynann Bologna, MD;  Location: [facility] ENDOSCOPY;  Service: Gastroenterology;  Laterality: N/A;   HOT HEMOSTASIS N/A 12/09/2020   Procedure: HOT HEMOSTASIS (ARGON PLASMA COAGULATION/BICAP);  Surgeon: Tressia Danas, MD;  Location: [facility] ENDOSCOPY;  Service: Gastroenterology;  Laterality: N/A;   LYMPHADENECTOMY Bilateral 02/27/2014   Procedure: BILATERAL LYMPHADENECTOMY;  Surgeon: Sebastian Ache, MD;  Location: [facility];  Service: Urology;  Laterality: Bilateral;   POLYPECTOMY  05/23/2020   Procedure: POLYPECTOMY;  Surgeon: Lynann Bologna, MD;   Location: [facility];  Service: Gastroenterology;;   PROSTATE BIOPSY  12/2013   Gleason 4+3=7, volume 31.31 cc   ROBOT ASSISTED LAPAROSCOPIC RADICAL PROSTATECTOMY N/A 02/27/2014   Procedure: ROBOTIC ASSISTED LAPAROSCOPIC RADICAL PROSTATECTOMY WITH INDOCYANINE GREEN DYE;  Surgeon: Sebastian Ache, MD;  Location: [facility];  Service: Urology;  Laterality: N/A;   Family History  Problem Relation Age of Onset   Heart disease Mother    Heart attack Father 98   Cancer Sister        breast   Colon cancer Neg Hx    Esophageal cancer Neg Hx    Rectal cancer Neg Hx    Stomach cancer Neg Hx    Social History   Socioeconomic History   Marital status: Married    Spouse name: Not on file   Number of children: Not on file   Years of education: Not on file   Highest education level: Not on file  Occupational History   Not on file  Tobacco Use   Smoking status: Every Day    Packs/day: .5    Types: Cigarettes   Smokeless tobacco: Never   Tobacco comments:  1 ppd Wants Patches .  Stopped 1.5 weeks ago   Vaping Use   Vaping Use: Never used  Substance and Sexual Activity   Alcohol use: Not Currently    Alcohol/week: 1.0 standard drink of alcohol    Types: 1 Cans of beer per week   Drug use: Not Currently    Types: Marijuana, Cocaine    Comment: past hx approx 30 years ago    Sexual activity: Not on file  Other Topics Concern   Not on file  Social History Narrative   Lives with sister   Otilio Carpen tea   Social Determinants of Health   Financial Resource Strain: Low Risk  (10/14/2022)   Overall Financial Resource Strain (CARDIA)    Difficulty of Paying Living Expenses: Not hard at all  Food Insecurity: No Food Insecurity (10/14/2022)   Hunger Vital Sign    Worried About Running Out of Food in the Last Year: Never true    Ran Out of Food in the Last Year: Never true  Transportation Needs: Unmet Transportation Needs (10/14/2022)   PRAPARE - Administrator, Civil Service  (Medical): Yes    Lack of Transportation (Non-Medical): No  Physical Activity: Insufficiently Active (10/14/2022)   Exercise Vital Sign    Days of Exercise per Week: 2 days    Minutes of Exercise per Session: 30 min  Stress: No Stress Concern Present (10/14/2022)   [employer] of Occupational Health - Occupational Stress Questionnaire    Feeling of Stress : Not at all  Social Connections: Socially Isolated (10/14/2022)   Social Connection and Isolation Panel [NHANES]    Frequency of Communication with Friends and Family: Three times a week    Frequency of Social Gatherings with Friends and Family: Once a week    Attends Religious Services: Never    Database administrator or Organizations: No    Attends Engineer, structural: Never    Marital Status: Divorced    Tobacco Counseling Ready to quit: Not Answered Counseling given: Not Answered Tobacco comments: 1 ppd Wants Patches .  Stopped 1.5 weeks ago    Clinical Intake:     Pain : No/denies pain Pain Score: 0-No pain     BMI - recorded: 22 Nutritional Status: BMI of 19-24  Normal Nutritional Risks: None Diabetes: Yes CBG done?: No Did pt. bring in CBG monitor from home?: No  How often do you need to have someone help you when you read instructions, pamphlets, or other written materials from your doctor or pharmacy?: 1 - Never What is the last grade level you completed in school?: 11 GRADE  Interpreter Needed?: No  Information entered by :: [facility] Sani Loiseau   Activities of Daily Living    10/14/2022    9:24 AM 10/14/2022    9:10 AM  In your present state of health, do you have any difficulty performing the following activities:  Hearing? 0 0  Vision?  1  Difficulty concentrating or making decisions? 1 1  Walking or climbing stairs? 1 1  Dressing or bathing? 0 0  Doing errands, shopping? 1 1  Preparing Food and eating ? N   Using the Toilet? N   In the past six months, have you accidently leaked  urine? N   Do you have problems with loss of bowel control? N   Managing your Medications? Y   Managing your Finances? Y   Housekeeping or managing your Housekeeping? Y     Patient  Care Team: Champ Mungo, DO as PCP - General (Internal Medicine) Fleet Contras, MD (Internal Medicine)  Indicate any recent Medical Services you may have received from other than [facility] providers in the past year (date may be approximate).     Assessment:   This is a routine wellness examination for [facility].  Hearing/Vision screen No results found.  Dietary issues and exercise activities discussed:     Goals Addressed   None   Depression Screen    10/14/2022    9:25 AM 10/14/2022    9:13 AM 10/14/2022    9:09 AM 12/04/2021    9:22 AM 06/08/2021    2:57 PM 01/23/2021   11:07 AM 12/26/2020   10:25 AM  PHQ 2/9 Scores  PHQ - 2 Score 0 0 0 0 0 0 0  PHQ- 9 Score     0 0 0    Fall Risk    10/14/2022    9:13 AM 10/14/2022    9:10 AM 12/04/2021    9:14 AM 06/08/2021    2:57 PM 01/23/2021   11:05 AM  Fall Risk   Falls in the past year? 1 1 1 1 1   Number falls in past yr: 1 1 1 1 1   Injury with Fall? 1 1 1 1  0  Risk for fall due to : Impaired mobility Impaired balance/gait History of fall(s);Impaired balance/gait Impaired balance/gait History of fall(s);Impaired balance/gait  Follow up Falls prevention discussed;Falls evaluation completed Falls evaluation completed;Falls prevention discussed Falls evaluation completed;Falls prevention discussed Falls evaluation completed;Falls prevention discussed Falls evaluation completed;Falls prevention discussed    MEDICARE RISK AT HOME:  Medicare Risk at Home - 10/14/22 0925     Any stairs in or around the home? Yes    If so, are there any without handrails? No    Home free of loose throw rugs in walkways, pet beds, electrical cords, etc? Yes    Adequate lighting in your home to reduce risk of falls? Yes    Life alert? No    Use of a cane, walker or w/c? Yes     Grab bars in the bathroom? Yes    Shower chair or bench in shower? Yes    Elevated toilet seat or a handicapped toilet? Yes             TIMED UP AND GO:  Was the test performed? No    Cognitive Function:        10/14/2022    9:13 AM  6CIT Screen  What Year? 0 points  What month? 0 points  What time? 3 points  Count back from 20 0 points  Months in reverse 4 points  Repeat phrase 0 points  Total Score 7 points    Immunizations Immunization History  Administered Date(s) Administered   Fluad Quad(high Dose 65+) 06/08/2021   Influenza, High Dose Seasonal PF 12/22/2018   PFIZER(Purple Top)SARS-COV-2 Vaccination 06/21/2019, 07/17/2019   PNEUMOCOCCAL CONJUGATE-20 11/06/2020   Tdap 08/19/2019    TDAP status: Up to date  Flu Vaccine status: Up to date  Pneumococcal vaccine status: Up to date  Covid-19 vaccine status: Completed vaccines  Qualifies for Shingles Vaccine? Yes   Zostavax completed No   Shingrix Completed?: No.    Education has been provided regarding the importance of this vaccine. Patient has been advised to call insurance company to determine out of pocket expense if they have not yet received this vaccine. Advised may also receive vaccine at local pharmacy or Health Dept.  Verbalized acceptance and understanding.  Screening Tests Health Maintenance  Topic Date Due   FOOT EXAM  Never done   Zoster Vaccines- Shingrix (1 of 2) Never done   COVID-19 Vaccine (3 - Pfizer risk series) 08/14/2019   Colonoscopy  05/23/2021   OPHTHALMOLOGY EXAM  12/05/2021   INFLUENZA VACCINE  11/18/2022   HEMOGLOBIN A1C  02/13/2023   Diabetic kidney evaluation - Urine ACR  08/15/2023   Diabetic kidney evaluation - eGFR measurement  08/16/2023   Medicare Annual Wellness (AWV)  10/14/2023   DTaP/Tdap/Td (2 - Td or Tdap) 08/18/2029   Pneumonia Vaccine 44+ Years old  Completed   Hepatitis C Screening  Completed   HPV VACCINES  Aged Out    Health Maintenance  Health  Maintenance Due  Topic Date Due   FOOT EXAM  Never done   Zoster Vaccines- Shingrix (1 of 2) Never done   COVID-19 Vaccine (3 - Pfizer risk series) 08/14/2019   Colonoscopy  05/23/2021   OPHTHALMOLOGY EXAM  12/05/2021    Colorectal cancer screening: Type of screening: Colonoscopy. Completed 05/23/2020. Repeat every 1 years  Lung Cancer Screening: (Low Dose CT Chest recommended if Age 60-80 years, 20 pack-year currently smoking OR have quit w/in 15years.) does qualify.   Lung Cancer Screening Referral: DEFERRED TO PCP   Additional Screening:  Hepatitis C Screening: does qualify; Completed 08/16/2022  Vision Screening: Recommended annual ophthalmology exams for early detection of glaucoma and other disorders of the eye. Is the patient up to date with their annual eye exam?  No  Who is the provider or what is the name of the office in which the patient attends annual eye exams? DOES NOT HAVE A EYE CARE DR  If pt is not established with a provider, would they like to be referred to a provider to establish care? Yes .   Dental Screening: Recommended annual dental exams for proper oral hygiene  Diabetic Foot Exam: Diabetic Foot Exam: Completed 10/14/22  Community Resource Referral / Chronic Care Management: CRR required this visit?  No   CCM required this visit?  No    Plan:     I have personally reviewed and noted the following in the patient's chart:   Medical and social history Use of alcohol, tobacco or illicit drugs  Current medications and supplements including opioid prescriptions. Patient is not currently taking opioid prescriptions. Functional ability and status Nutritional status Physical activity Advanced directives List of other physicians Hospitalizations, surgeries, and ER visits in previous 12 months Vitals Screenings to include cognitive, depression, and falls Referrals and appointments  In addition, I have reviewed and discussed with patient certain  preventive protocols, quality metrics, and best practice recommendations. A written personalized care plan for preventive services as well as general preventive health recommendations were provided to patient.     Derrell Lolling, CMA   10/14/2022   After Visit Summary: (Declined) Due to this being a telephonic visit, with patients personalized plan was offered to patient but patient Declined AVS at this time   Nurse Notes: IN PERSON  [facility]    Mr. Schetter , Thank you for taking time to come for your Medicare Wellness Visit. I appreciate your ongoing commitment to your health goals. Please review the following plan we discussed and let me know if I can assist you in the future.   These are the goals we discussed:  Goals   None     This is a list of the screening  recommended for you and due dates:  Health Maintenance  Topic Date Due   Complete foot exam   Never done   Zoster (Shingles) Vaccine (1 of 2) Never done   COVID-19 Vaccine (3 - Pfizer risk series) 08/14/2019   Colon Cancer Screening  05/23/2021   Eye exam for diabetics  12/05/2021   Flu Shot  11/18/2022   Hemoglobin A1C  02/13/2023   Yearly kidney health urinalysis for diabetes  08/15/2023   Yearly kidney function blood test for diabetes  08/16/2023   Medicare Annual Wellness Visit  10/14/2023   DTaP/Tdap/Td vaccine (2 - Td or Tdap) 08/18/2029   Pneumonia Vaccine  Completed   Hepatitis C Screening  Completed   HPV Vaccine  Aged Out

## 2022-10-14 NOTE — Patient Instructions (Signed)
Health Maintenance, Male Adopting a healthy lifestyle and getting preventive care are important in promoting health and wellness. Ask your health care provider about: The right schedule for you to have regular tests and exams. Things you can do on your own to prevent diseases and keep yourself healthy. What should I know about diet, weight, and exercise? Eat a healthy diet  Eat a diet that includes plenty of vegetables, fruits, low-fat dairy products, and lean protein. Do not eat a lot of foods that are high in solid fats, added sugars, or sodium. Maintain a healthy weight Body mass index (BMI) is a measurement that can be used to identify possible weight problems. It estimates body fat based on height and weight. Your health care provider can help determine your BMI and help you achieve or maintain a healthy weight. Get regular exercise Get regular exercise. This is one of the most important things you can do for your health. Most adults should: Exercise for at least 150 minutes each week. The exercise should increase your heart rate and make you sweat (moderate-intensity exercise). Do strengthening exercises at least twice a week. This is in addition to the moderate-intensity exercise. Spend less time sitting. Even light physical activity can be beneficial. Watch cholesterol and blood lipids Have your blood tested for lipids and cholesterol at 75 years of age, then have this test every 5 years. You may need to have your cholesterol levels checked more often if: Your lipid or cholesterol levels are high. You are older than 75 years of age. You are at high risk for heart disease. What should I know about cancer screening? Many types of cancers can be detected early and may often be prevented. Depending on your health history and family history, you may need to have cancer screening at various ages. This may include screening for: Colorectal cancer. Prostate cancer. Skin cancer. Lung  cancer. What should I know about heart disease, diabetes, and high blood pressure? Blood pressure and heart disease High blood pressure causes heart disease and increases the risk of stroke. This is more likely to develop in people who have high blood pressure readings or are overweight. Talk with your health care provider about your target blood pressure readings. Have your blood pressure checked: Every 3-5 years if you are 18-39 years of age. Every year if you are 40 years old or older. If you are between the ages of 65 and 75 and are a current or former smoker, ask your health care provider if you should have a one-time screening for abdominal aortic aneurysm (AAA). Diabetes Have regular diabetes screenings. This checks your fasting blood sugar level. Have the screening done: Once every three years after age 45 if you are at a normal weight and have a low risk for diabetes. More often and at a younger age if you are overweight or have a high risk for diabetes. What should I know about preventing infection? Hepatitis B If you have a higher risk for hepatitis B, you should be screened for this virus. Talk with your health care provider to find out if you are at risk for hepatitis B infection. Hepatitis C Blood testing is recommended for: Everyone born from 1945 through 1965. Anyone with known risk factors for hepatitis C. Sexually transmitted infections (STIs) You should be screened each year for STIs, including gonorrhea and chlamydia, if: You are sexually active and are younger than 75 years of age. You are older than 75 years of age and your   health care provider tells you that you are at risk for this type of infection. Your sexual activity has changed since you were last screened, and you are at increased risk for chlamydia or gonorrhea. Ask your health care provider if you are at risk. Ask your health care provider about whether you are at high risk for HIV. Your health care provider  may recommend a prescription medicine to help prevent HIV infection. If you choose to take medicine to prevent HIV, you should first get tested for HIV. You should then be tested every 3 months for as long as you are taking the medicine. Follow these instructions at home: Alcohol use Do not drink alcohol if your health care provider tells you not to drink. If you drink alcohol: Limit how much you have to 0-2 drinks a day. Know how much alcohol is in your drink. In the [place] one drink equals one 12 oz bottle of beer (355 mL), one 5 oz glass of wine (148 mL), or one 1 oz glass of hard liquor (44 mL). Lifestyle Do not use any products that contain nicotine or tobacco. These products include cigarettes, chewing tobacco, and vaping devices, such as e-cigarettes. If you need help quitting, ask your health care provider. Do not use street drugs. Do not share needles. Ask your health care provider for help if you need support or information about quitting drugs. General instructions Schedule regular health, dental, and eye exams. Stay current with your vaccines. Tell your health care provider if: You often feel depressed. You have ever been abused or do not feel safe at home. Summary Adopting a healthy lifestyle and getting preventive care are important in promoting health and wellness. Follow your health care provider's instructions about healthy diet, exercising, and getting tested or screened for diseases. Follow your health care provider's instructions on monitoring your cholesterol and blood pressure. This information is not intended to replace advice given to you by your health care provider. Make sure you discuss any questions you have with your health care provider. Document Revised: 08/25/2020 Document Reviewed: 08/25/2020 Elsevier Patient Education  2024 [employer] Inc[employer]  

## 2022-10-14 NOTE — Assessment & Plan Note (Signed)
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 He received IV iron supplementation during his last hospitalization and discharged home with 30 tablets of iron polysaccharide 150 mg -Repeat iron studies, CBC

## 2022-10-14 NOTE — Assessment & Plan Note (Addendum)
Patient has residual memory loss, mild dysarthria and word finding difficulties. His memory problems makes it unsafe for him to leave the house without assistance. He will benefit from continuing home health services. He also needs assistance with transportation to and from his medical appointments. -Ambulatory referral to Home health PT, nursing, nurse aide and social work -Refill Lipitor 40 mg daily

## 2022-10-14 NOTE — Assessment & Plan Note (Signed)
Patient with a history of untreated chronic hepatitis C. He was referred to ID for treatment last year however patient no-showed to this appointment due to lack of transportation. During his recent hospitalization, HCV RNA was 3500 from 11,500 a year ago. LFTs and platelets were within normal.  His FIB4 score shows intermediate risk for liver fibrosis. Plan to repeat LFTs and get RUQ ultrasound for [facility] screening  Plan: -Follow-up scheduled with Dr. Ninetta Lights on 7/17 for hep C treatment -Check CMP, CBC -Follow-up RUQ U/S

## 2022-10-14 NOTE — Assessment & Plan Note (Addendum)
Patient was hospitalized from 4/26 to 4/29 for severe symptomatic hypertension after presenting for a headache in the setting of being out of his losartan for weeks prior to the hospitalization. He was started on amlodipine and hydralazine with improvement in his blood pressures prior to discharge.  He was discharged on losartan 50 mg daily, amlodipine 5 mg daily and hydralazine 25 mg every 8 hours with schedule hospital follow-up with PCP and nephrology. Patient states he was unable to make either of these appointments because of lack of transportation. He lives by himself but has a nursing aide that comes to the house to help with medications and cooking. He has also been receiving home health PT. His nephew brought him to his appointment today and states that patient has been out of his blood pressure medications except hydralazine. His BP is significantly elevated today with SBP in the 170s to 180s. He denies any headaches, dizziness, shortness of breath or blurry vision. I will resume all his antihypertensive meds and reach out to our social worker to assist patient with transportation to and from his medical appointments.  Plan: -Refill amlodipine 5 mg daily -Refill losartan 50 mg daily -Refill hydralazine 25 mg every 8 hours -Check kidney function -Follow-up in 3 months for repeat BP and BMP

## 2022-10-14 NOTE — Addendum Note (Signed)
Addended bySharrell Ku on: 10/14/2022 05:12 PM   Modules accepted: Level of Service

## 2022-10-14 NOTE — Progress Notes (Signed)
CC: Hospital follow-up  HPI:  Gregory Nunez. is a 77 y.o. male with PMH as below who presents to clinic accompanied by his nephew for hospital follow-up after hospitalization for severe hypertension 2 months ago. Please see problem based charting for evaluation, assessment and plan.  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   Patient is approximately 5-week status post bunion correction left foot   Hyperlipidemia    Hypertension    Hypertensive retinopathy    OU   Hypoglycemia 10/22/2020   Malignant neoplasm of prostate (HCC) 01/09/2014   Neuropathy    Pancreatitis    Pneumococcal vaccination administered at current visit 11/10/2020   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 Negative for headache or weakness  Physical Exam: General: Pleasant, well-appearing elderly man.  No acute distress. Cardiac: RRR. No murmurs, rubs or gallops. No LE edema Respiratory: Lungs CTAB. No wheezing or crackles. Abdominal: Soft, symmetric and non tender. Normal  BS. Skin: Warm, dry and intact without rashes or lesions. No stigmata of cirrhosis. Extremities: Atraumatic. Full ROM. Palpable radial and DP pulses. Neuro: A&O x 3. Moves all extremities.  Mild dysarthria with baseline word finding difficulty.  Normal sensation to gross touch. Psych: Appropriate mood and affect.  Vitals:   10/14/22 0902 10/14/22 0918  BP: (!) 173/88 (!) 185/77  Pulse: 92 84  Temp: 97.7 F (36.5 C)   TempSrc: Oral   SpO2: 100%   Weight: 159 lb 11.2 oz (72.4 kg)   Height: 5\' 11"  (1.803 m)     Assessment & Plan:   Severe hypertension Patient was hospitalized from 4/26 to 4/29 for severe symptomatic hypertension after presenting for a headache in the setting of being out of his losartan for weeks prior to the hospitalization. He was started on amlodipine and hydralazine with improvement in his blood pressures prior to discharge.  He was discharged on losartan 50 mg daily, amlodipine 5 mg daily and hydralazine 25 mg every 8 hours with schedule hospital follow-up with PCP and nephrology. Patient states he was unable to make either of these appointments because of lack of transportation. He lives by himself but has a nursing aide that comes to the house to help with medications and cooking. He has also been receiving home health PT. His nephew brought him to his appointment today and states that patient has been out of his blood pressure medications except hydralazine. His BP is significantly elevated today with SBP in the 170s to 180s. He denies any headaches, dizziness, shortness of breath or blurry  vision. I will resume all his antihypertensive meds and reach out to our social worker to assist patient with transportation to and from his medical appointments.  Plan: -Refill amlodipine 5 mg daily -Refill losartan 50 mg daily -Refill hydralazine 25 mg every 8 hours -Check kidney function -Follow-up in 3 months for repeat BP and BMP  CKD (chronic kidney disease) stage 4, GFR  15-29 ml/min (HCC) Patient has had a history of proteinuria since 2013. During his recent hospitalization, he was found to have nephrotic range proteinuria and acute on chronic kidney disease. Workup during the hospitalization for possible glomerulonephritis reviewed normal complements, ANCA, ANA and protein electrophoresis. Renal ultrasound was unremarkable.  Nephrology was consulted for evaluation and possible kidney biopsy. 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 Patient was unable to follow-up with nephrology due to lack of transportation.  On discharge, his creatinine was 2.83, up from 2.26 on day of admission. 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  Plan: -Advised his nephew to call nephrology for appointment -Check renal function, protein/creatinine ratio  Hepatitis C test positive Patient with a history of untreated chronic hepatitis C. He was referred to ID for treatment last year however patient no-showed to this appointment due to lack of transportation. During his recent hospitalization, HCV RNA was 3500 from 11,500 a year ago. LFTs and platelets were within normal.  His FIB4 score shows intermediate risk for liver fibrosis. Plan to repeat LFTs and get RUQ ultrasound for [facility] screening  Plan: -Follow-up scheduled with Dr. Ninetta Lights on 7/17 for hep C treatment -Check CMP, CBC -Follow-up RUQ U/S  Iron deficiency anemia Patient has a history of iron deficiency secondary to angiodysplasia, AVM and GI bleed. He received IV iron supplementation during his last hospitalization and discharged home with 30 tablets of iron polysaccharide 150 mg -Repeat iron studies, CBC  Type 2 diabetes with complication (HCC) Well-controlled with A1c of 5.8% 2 months ago. He is currently not on any antidiabetic medications. Continue to  monitor.  History of CVA (cerebrovascular accident) Patient has residual memory loss, mild dysarthria and word finding difficulties. His memory problems makes it unsafe for him to leave the house without assistance. He will benefit from continuing home health services. He also needs assistance with transportation to and from his medical appointments. -Ambulatory referral to Home health PT, nursing, nurse aide and social work -Refill Lipitor 40 mg daily   See Encounters Tab for problem based charting.  Patient discussed with Dr. Anthoney Harada, MD, MPH

## 2022-10-14 NOTE — Patient Instructions (Addendum)
Thank you, [name] for allowing [place] to provide your care today. Today we discussed your recent hospitalization, blood pressure, hepatitis C infection, diabetes and transportation issues.   I have resumed all your medications.  Please follow-up with your kidney doctor.  I have also put in an order to resolve your home health aide and physical therapy.  I am reaching out to our social worker to help assist you with transportation to your doctor's appointment.  I have ordered the following labs for you:  Lab Orders         CMP14 + Anion Gap         CBC no Diff         Ferritin         Iron and IBC ([id number]-22025,42706)         Protein / Creatinine Ratio, Urine      I will call if any are abnormal. All of your labs can be accessed through "My Chart".  I have ordered the following tests: Right upper quadrant ultrasound  My Chart Access: [email or web address]  Please follow-up in 3 months. Please schedule an appointment with Dr. Ninetta Nunez for hepatitis C treatment  Please make sure to arrive 15 minutes prior to your next appointment. If you arrive late, you may be asked to reschedule.    We look forward to seeing you next time. Please call our clinic at [phone] if you have any questions or concerns. The best time to call is Monday-Friday from 9am-4pm, but there is someone available 24/7. If after hours or the weekend, call the main hospital number and ask for the Internal Medicine Resident On-Call. If you need medication refills, please notify your pharmacy one week in advance and they will send [place] a request.   Thank you for letting us take part in your care. Wishing you the best!  Gregory Rainwater, MD 10/14/2022, 10:01 AM IM Resident, PGY-3 Gregory Nunez 41:10

## 2022-10-15 LAB — CMP14 + ANION GAP

## 2022-10-15 LAB — IRON AND TIBC

## 2022-10-15 LAB — CBC
MCV: 93 fL (ref 79–97)
Platelets: 166 10*3/uL (ref 150–450)
RDW: 11.9 % (ref 11.6–15.4)

## 2022-10-15 LAB — PROTEIN / CREATININE RATIO, URINE
Creatinine, Urine: 101.2 mg/dL
Protein, Ur: 399.6 mg/dL
Protein/Creat Ratio: 3949 mg/g creat — ABNORMAL HIGH (ref 0–200)

## 2022-10-15 NOTE — Progress Notes (Signed)
Internal Medicine Clinic Attending  Case and documentation of Dr. Amponsah  reviewed.  I reviewed the AWV findings.  I agree with the assessment, diagnosis, and plan of care documented in the AWV note.     

## 2022-10-15 NOTE — Progress Notes (Signed)
Internal Medicine Clinic Attending  Case discussed with Dr. Amponsah  At the time of the visit.  We reviewed the resident's history and exam and pertinent patient test results.  I agree with the assessment, diagnosis, and plan of care documented in the resident's note.  

## 2022-10-16 LAB — CMP14 + ANION GAP
AST: 24 IU/L (ref 0–40)
Alkaline Phosphatase: 63 IU/L (ref 44–121)
CO2: 21 mmol/L (ref 20–29)
Chloride: 103 mmol/L (ref 96–106)
Creatinine, Ser: 2.46 mg/dL — ABNORMAL HIGH (ref 0.76–1.27)
Globulin, Total: 3.4 g/dL (ref 1.5–4.5)
Potassium: 3.8 mmol/L (ref 3.5–5.2)
Sodium: 140 mmol/L (ref 134–144)
Total Protein: 7.4 g/dL (ref 6.0–8.5)
eGFR: 27 mL/min/{1.73_m2} — ABNORMAL LOW (ref >59)

## 2022-10-16 LAB — CBC
Hematocrit: 32.2 % — ABNORMAL LOW (ref 37.5–51.0)
Hemoglobin: 10.6 g/dL — ABNORMAL LOW (ref 13.0–17.7)
MCH: 30.6 pg (ref 26.6–33.0)
MCHC: 32.9 g/dL (ref 31.5–35.7)
RBC: 3.46 x10E6/uL — ABNORMAL LOW (ref 4.14–5.80)
WBC: 3.1 10*3/uL — ABNORMAL LOW (ref 3.4–10.8)

## 2022-10-16 LAB — FERRITIN: Ferritin: 43 ng/mL (ref 30–400)

## 2022-10-16 LAB — IRON AND TIBC
Iron: 96 ug/dL (ref 38–169)
Total Iron Binding Capacity: 333 ug/dL (ref 250–450)

## 2022-10-22 ENCOUNTER — Telehealth: Payer: Self-pay | Admitting: *Deleted

## 2022-10-22 NOTE — Telephone Encounter (Signed)
Spoke with nephew michael appointment reminder and instruction given . Casimiro Needle was able to read back the information regarding the appointment.   Appointment 10-28-2022  @ 10:00 am at [facility] -in needing to cancel or reschedule  to call  [phone].

## 2022-10-28 ENCOUNTER — Ambulatory Visit (HOSPITAL_COMMUNITY)
Admission: RE | Admit: 2022-10-28 | Discharge: 2022-10-28 | Disposition: A | Discharge status: Home / Self Care | Payer: 59 | Source: Ambulatory Visit | Attending: Student in an Organized Health Care Education/Training Program | Admitting: Student in an Organized Health Care Education/Training Program

## 2022-10-28 DIAGNOSIS — B1920 Unspecified viral hepatitis C without hepatic coma: Secondary | ICD-10-CM | POA: Diagnosis present

## 2022-11-03 ENCOUNTER — Other Ambulatory Visit (HOSPITAL_COMMUNITY): Payer: Self-pay

## 2022-11-03 ENCOUNTER — Other Ambulatory Visit: Payer: Self-pay

## 2022-11-03 ENCOUNTER — Ambulatory Visit (INDEPENDENT_AMBULATORY_CARE_PROVIDER_SITE_OTHER): Payer: 59 | Admitting: Infectious Diseases

## 2022-11-03 ENCOUNTER — Encounter: Payer: Self-pay | Admitting: Infectious Diseases

## 2022-11-03 VITALS — BP 149/83 | HR 79 | Temp 97.7°F | Resp 28 | Ht 71.0 in | Wt 157.9 lb

## 2022-11-03 DIAGNOSIS — B182 Chronic viral hepatitis C: Secondary | ICD-10-CM | POA: Insufficient documentation

## 2022-11-03 DIAGNOSIS — E118 Type 2 diabetes mellitus with unspecified complications: Secondary | ICD-10-CM | POA: Diagnosis not present

## 2022-11-03 DIAGNOSIS — I10 Essential (primary) hypertension: Secondary | ICD-10-CM | POA: Diagnosis not present

## 2022-11-03 MED ORDER — GLECAPREVIR-PIBRENTASVIR 100-40 MG PO TABS: 3.0000 | ORAL_TABLET | Freq: Every day | ORAL | 1 refills | Status: DC

## 2022-11-03 NOTE — Assessment & Plan Note (Signed)
Fairly well controlled today vs previous.  Appreciate the outstanding f/u from [facility].

## 2022-11-03 NOTE — Progress Notes (Signed)
   Subjective:    Patient ID: Gregory Nunez., male  DOB: 01/31/1946, 77 y.o.        MRN: [id number]   HPI 77 yo M with hx of DM2, recent adm for severe uncontrolled HTN, CKD4, and hepatitis C.  1b and 3530 (06-2022). He is unaware of this.  He had RUQ u/s 10-2022: cholelithiasis without cholelithiasis.  FIB4 2.52.  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  Denies ETOH use.  He describes that he had a CVA 1-1.5 years ago.  He is unclear if he has DM.   Health Maintenance  Topic Date Due  . Zoster Vaccines- Shingrix (1 of 2) Never done  . COVID-19 Vaccine (3 - Pfizer risk series) 08/14/2019  . Colonoscopy  05/23/2021  . OPHTHALMOLOGY EXAM  12/05/2021  . INFLUENZA VACCINE  11/18/2022  . HEMOGLOBIN A1C  02/13/2023  . Diabetic kidney evaluation - eGFR measurement  10/14/2023  . Diabetic kidney evaluation - Urine ACR  10/14/2023  . FOOT EXAM  10/14/2023  . Medicare Annual Wellness (AWV)  10/14/2023  . DTaP/Tdap/Td (2 - Td or Tdap) 08/18/2029  . Pneumonia Vaccine 22+ Years old  Completed  . Hepatitis C Screening  Completed  . HPV VACCINES  Aged Out   The past medical history, family history and social history were reviewed/updated in EPIC   Review of Systems  Constitutional:  Positive for weight loss. Negative for chills and fever.  HENT:  Positive for nosebleeds (one episode).   Respiratory:  Negative for cough and shortness of breath.   Cardiovascular:  Negative for chest pain.  Gastrointestinal:  Negative for blood in stool, constipation and diarrhea.  Neurological:  Negative for sensory change.  No icterus or pale stool  Please see HPI. All other systems reviewed and negative.     Objective:  Physical Exam Vitals reviewed.  Constitutional:      General: He is not in acute distress.    Appearance: Normal appearance. He is not ill-appearing or diaphoretic.  HENT:     Mouth/Throat:     Mouth: Mucous membranes are moist.     Pharynx: No oropharyngeal  exudate.  Eyes:     General: No scleral icterus.    Extraocular Movements: Extraocular movements intact.     Pupils: Pupils are equal, round, and reactive to light.  Cardiovascular:     Rate and Rhythm: Normal rate and regular rhythm.  Pulmonary:     Effort: Pulmonary effort is normal.     Breath sounds: Normal breath sounds.  Abdominal:     General: Bowel sounds are normal. There is no distension.     Palpations: Abdomen is soft.     Tenderness: There is no abdominal tenderness.  Musculoskeletal:     Cervical back: Normal range of motion and neck supple. No rigidity.     Right lower leg: No edema.     Left lower leg: No edema.  Neurological:     Mental Status: He is alert.          Assessment & Plan:

## 2022-11-03 NOTE — Assessment & Plan Note (Addendum)
He needs further education around this.  Last A1C <6% Appreciate his outstanding f/u with [facility].  F/u set in 2 months with [facility].

## 2022-11-03 NOTE — Assessment & Plan Note (Addendum)
Will check his Hep B/A to see if he needs vaccination Will check his HIV Will check his RPR Will ask pharm to help and see what his insurance will coverage for treatment.  Will see him back in 2 weeks to initiate treatment.  Have asked him to stop his lipitor while on this.  Pt is amenable to treatment plan.

## 2022-11-05 ENCOUNTER — Telehealth: Payer: Self-pay

## 2022-11-05 ENCOUNTER — Other Ambulatory Visit (HOSPITAL_COMMUNITY): Payer: Self-pay

## 2022-11-05 LAB — HEPATITIS B SURFACE ANTIGEN: Hepatitis B Surface Ag: NEGATIVE

## 2022-11-05 LAB — HIV ANTIBODY (ROUTINE TESTING W REFLEX): HIV Screen 4th Generation wRfx: NONREACTIVE

## 2022-11-05 LAB — RPR: RPR Ser Ql: NONREACTIVE

## 2022-11-05 LAB — HEPATITIS A ANTIBODY, TOTAL: hep A Total Ab: NEGATIVE

## 2022-11-05 LAB — HEPATITIS B SURFACE ANTIBODY,QUALITATIVE: Hep B Surface Ab, Qual: NONREACTIVE

## 2022-11-05 NOTE — Telephone Encounter (Signed)
Pharmacy Patient Advocate Encounter   Received notification from Physician's Office that prior authorization for Mavyret is required/requested.   Insurance verification completed.   The patient is insured through  [employer]  .   PA submitted to [facility] MEDICARE via CoverMyMeds Key/confirmation #/EOC [id number] Status is pending

## 2022-11-09 ENCOUNTER — Other Ambulatory Visit (HOSPITAL_COMMUNITY): Payer: Self-pay

## 2022-11-09 NOTE — Telephone Encounter (Signed)
Pharmacy Patient Advocate Encounter  Received notification from [facility]) that Prior Authorization for MAVYRET has been APPROVED from 11/05/22 to 12/31/22.Marland Kitchen  PA #/Case ID/Reference #: [id number]  Copay $0

## 2022-11-23 ENCOUNTER — Encounter: Payer: Self-pay | Admitting: Infectious Diseases

## 2022-11-23 ENCOUNTER — Ambulatory Visit (INDEPENDENT_AMBULATORY_CARE_PROVIDER_SITE_OTHER): Payer: 59 | Admitting: Infectious Diseases

## 2022-11-23 ENCOUNTER — Other Ambulatory Visit: Payer: Self-pay

## 2022-11-23 VITALS — BP 136/67 | HR 80 | Temp 98.1°F | Ht 73.0 in | Wt 159.9 lb

## 2022-11-23 DIAGNOSIS — Z23 Encounter for immunization: Secondary | ICD-10-CM

## 2022-11-23 DIAGNOSIS — B182 Chronic viral hepatitis C: Secondary | ICD-10-CM | POA: Diagnosis not present

## 2022-11-23 MED ORDER — GLECAPREVIR-PIBRENTASVIR 100-40 MG PO TABS
3.0000 | ORAL_TABLET | Freq: Every day | ORAL | 2 refills | Status: DC
Start: 2022-11-23 — End: 2023-06-20

## 2022-11-23 NOTE — Addendum Note (Signed)
Addended by: Derrell Lolling on: 11/23/2022 04:08 PM   Modules accepted: Orders

## 2022-11-23 NOTE — Progress Notes (Signed)
   Subjective:    Patient ID: Gregory Kayser., male  DOB: 02-09-1946, 77 y.o.        MRN: [id number]   HPI 77 yo M with hx of DM2, recent adm for severe uncontrolled HTN, CKD4, and hepatitis C.  1b and 3530 (06-2022).  He had RUQ u/s 10-2022: cholelithiasis without cholelithiasis.  FIB4 2.52.  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  He had eval by pharm and has been ok'ed to start mavyret.   He c/o balance issues today, ongoing for some time. His BP is borderline today. On 3 anti-htn rx.   Had a sister die this past weekend. Has been weighing on his mind.    Health Maintenance  Topic Date Due  . Zoster Vaccines- Shingrix (1 of 2) Never done  . COVID-19 Vaccine (3 - Pfizer risk series) 08/14/2019  . Colonoscopy  05/23/2021  . OPHTHALMOLOGY EXAM  12/05/2021  . INFLUENZA VACCINE  11/18/2022  . HEMOGLOBIN A1C  02/13/2023  . Diabetic kidney evaluation - eGFR measurement  10/14/2023  . Diabetic kidney evaluation - Urine ACR  10/14/2023  . FOOT EXAM  10/14/2023  . Medicare Annual Wellness (AWV)  10/14/2023  . DTaP/Tdap/Td (2 - Td or Tdap) 08/18/2029  . Pneumonia Vaccine 69+ Years old  Completed  . Hepatitis C Screening  Completed  . HPV VACCINES  Aged Out      Review of Systems  Constitutional:  Negative for weight loss.  Respiratory:  Negative for shortness of breath.   Cardiovascular:  Negative for chest pain.  Gastrointestinal:  Negative for blood in stool, constipation and diarrhea.  Genitourinary:  Negative for dysuria.  Neurological:  Negative for dizziness and headaches.    Please see HPI. All other systems reviewed and negative.     Objective:  Physical Exam Vitals reviewed.  Constitutional:      General: He is not in acute distress.    Appearance: Normal appearance. He is not ill-appearing.  HENT:     Mouth/Throat:     Mouth: Mucous membranes are moist.     Pharynx: No oropharyngeal exudate.  Eyes:     Extraocular Movements:  Extraocular movements intact.     Pupils: Pupils are equal, round, and reactive to light.  Cardiovascular:     Rate and Rhythm: Normal rate and regular rhythm.  Pulmonary:     Effort: Pulmonary effort is normal.     Breath sounds: Normal breath sounds.  Abdominal:     General: Bowel sounds are normal. There is no distension.     Palpations: Abdomen is soft.     Tenderness: There is no abdominal tenderness.  Musculoskeletal:     Cervical back: Normal range of motion and neck supple.     Right lower leg: No edema.     Left lower leg: No edema.  Skin:    Coloration: Skin is not jaundiced.  Neurological:     Mental Status: He is alert.          Assessment & Plan:

## 2022-11-23 NOTE — Assessment & Plan Note (Signed)
Will star him on mavyret.  Will vaccinate him for Hep A/B Will order elastogram.  Will see him back in 6-8 weeks.

## 2022-12-06 ENCOUNTER — Other Ambulatory Visit: Payer: 59

## 2022-12-14 ENCOUNTER — Other Ambulatory Visit: Payer: 59

## 2022-12-30 ENCOUNTER — Other Ambulatory Visit: Payer: 59

## 2023-01-13 ENCOUNTER — Encounter: Payer: 59 | Admitting: Student

## 2023-02-28 ENCOUNTER — Other Ambulatory Visit: Payer: 59

## 2023-06-13 ENCOUNTER — Other Ambulatory Visit: Payer: Self-pay

## 2023-06-13 ENCOUNTER — Inpatient Hospital Stay (HOSPITAL_COMMUNITY)
Admission: EM | Admit: 2023-06-13 | Discharge: 2023-06-20 | DRG: 683 | Disposition: A | Discharge status: Skilled Nursing Facility | Payer: 59 | Source: Skilled Nursing Facility | Attending: Family Medicine | Admitting: Family Medicine

## 2023-06-13 ENCOUNTER — Encounter (HOSPITAL_COMMUNITY): Payer: Self-pay

## 2023-06-13 DIAGNOSIS — Z880 Allergy status to penicillin: Secondary | ICD-10-CM

## 2023-06-13 DIAGNOSIS — I69398 Other sequelae of cerebral infarction: Secondary | ICD-10-CM

## 2023-06-13 DIAGNOSIS — D72819 Decreased white blood cell count, unspecified: Secondary | ICD-10-CM | POA: Diagnosis present

## 2023-06-13 DIAGNOSIS — D649 Anemia, unspecified: Secondary | ICD-10-CM | POA: Diagnosis present

## 2023-06-13 DIAGNOSIS — N184 Chronic kidney disease, stage 4 (severe): Secondary | ICD-10-CM | POA: Diagnosis present

## 2023-06-13 DIAGNOSIS — R410 Disorientation, unspecified: Secondary | ICD-10-CM | POA: Diagnosis present

## 2023-06-13 DIAGNOSIS — E872 Acidosis, unspecified: Secondary | ICD-10-CM | POA: Diagnosis present

## 2023-06-13 DIAGNOSIS — J449 Chronic obstructive pulmonary disease, unspecified: Secondary | ICD-10-CM | POA: Diagnosis present

## 2023-06-13 DIAGNOSIS — I129 Hypertensive chronic kidney disease with stage 1 through stage 4 chronic kidney disease, or unspecified chronic kidney disease: Principal | ICD-10-CM | POA: Diagnosis present

## 2023-06-13 DIAGNOSIS — F17210 Nicotine dependence, cigarettes, uncomplicated: Secondary | ICD-10-CM | POA: Diagnosis present

## 2023-06-13 DIAGNOSIS — E118 Type 2 diabetes mellitus with unspecified complications: Secondary | ICD-10-CM | POA: Diagnosis present

## 2023-06-13 DIAGNOSIS — T461X6A Underdosing of calcium-channel blockers, initial encounter: Secondary | ICD-10-CM | POA: Diagnosis present

## 2023-06-13 DIAGNOSIS — E113293 Type 2 diabetes mellitus with mild nonproliferative diabetic retinopathy without macular edema, bilateral: Secondary | ICD-10-CM | POA: Diagnosis present

## 2023-06-13 DIAGNOSIS — I69320 Aphasia following cerebral infarction: Secondary | ICD-10-CM

## 2023-06-13 DIAGNOSIS — D631 Anemia in chronic kidney disease: Secondary | ICD-10-CM | POA: Diagnosis present

## 2023-06-13 DIAGNOSIS — Z8619 Personal history of other infectious and parasitic diseases: Secondary | ICD-10-CM

## 2023-06-13 DIAGNOSIS — R4182 Altered mental status, unspecified: Secondary | ICD-10-CM | POA: Diagnosis not present

## 2023-06-13 DIAGNOSIS — Z8546 Personal history of malignant neoplasm of prostate: Secondary | ICD-10-CM

## 2023-06-13 DIAGNOSIS — D509 Iron deficiency anemia, unspecified: Secondary | ICD-10-CM | POA: Diagnosis present

## 2023-06-13 DIAGNOSIS — R531 Weakness: Principal | ICD-10-CM

## 2023-06-13 DIAGNOSIS — D62 Acute posthemorrhagic anemia: Secondary | ICD-10-CM | POA: Diagnosis present

## 2023-06-13 DIAGNOSIS — T465X6A Underdosing of other antihypertensive drugs, initial encounter: Secondary | ICD-10-CM | POA: Diagnosis present

## 2023-06-13 DIAGNOSIS — Z8719 Personal history of other diseases of the digestive system: Secondary | ICD-10-CM

## 2023-06-13 DIAGNOSIS — Z91138 Patient's unintentional underdosing of medication regimen for other reason: Secondary | ICD-10-CM

## 2023-06-13 DIAGNOSIS — I69322 Dysarthria following cerebral infarction: Secondary | ICD-10-CM

## 2023-06-13 DIAGNOSIS — Z8673 Personal history of transient ischemic attack (TIA), and cerebral infarction without residual deficits: Secondary | ICD-10-CM

## 2023-06-13 DIAGNOSIS — Z79899 Other long term (current) drug therapy: Secondary | ICD-10-CM

## 2023-06-13 DIAGNOSIS — R030 Elevated blood-pressure reading, without diagnosis of hypertension: Secondary | ICD-10-CM

## 2023-06-13 DIAGNOSIS — E785 Hyperlipidemia, unspecified: Secondary | ICD-10-CM | POA: Diagnosis present

## 2023-06-13 DIAGNOSIS — E1122 Type 2 diabetes mellitus with diabetic chronic kidney disease: Secondary | ICD-10-CM | POA: Diagnosis present

## 2023-06-13 DIAGNOSIS — G40909 Epilepsy, unspecified, not intractable, without status epilepticus: Secondary | ICD-10-CM | POA: Diagnosis present

## 2023-06-13 DIAGNOSIS — Z7951 Long term (current) use of inhaled steroids: Secondary | ICD-10-CM

## 2023-06-13 DIAGNOSIS — I10 Essential (primary) hypertension: Secondary | ICD-10-CM

## 2023-06-13 DIAGNOSIS — Z8249 Family history of ischemic heart disease and other diseases of the circulatory system: Secondary | ICD-10-CM

## 2023-06-13 DIAGNOSIS — E119 Type 2 diabetes mellitus without complications: Secondary | ICD-10-CM | POA: Diagnosis present

## 2023-06-13 DIAGNOSIS — D638 Anemia in other chronic diseases classified elsewhere: Secondary | ICD-10-CM

## 2023-06-13 DIAGNOSIS — E1169 Type 2 diabetes mellitus with other specified complication: Secondary | ICD-10-CM | POA: Diagnosis present

## 2023-06-13 DIAGNOSIS — N179 Acute kidney failure, unspecified: Secondary | ICD-10-CM | POA: Insufficient documentation

## 2023-06-13 LAB — CBC WITH DIFFERENTIAL/PLATELET
Abs Immature Granulocytes: 0 10*3/uL (ref 0.00–0.07)
Basophils Absolute: 0 10*3/uL (ref 0.0–0.1)
Basophils Relative: 0 %
Eosinophils Absolute: 0.1 10*3/uL (ref 0.0–0.5)
Eosinophils Relative: 3 %
HCT: 23.2 % — ABNORMAL LOW (ref 39.0–52.0)
Hemoglobin: 7.1 g/dL — ABNORMAL LOW (ref 13.0–17.0)
Immature Granulocytes: 0 %
Lymphocytes Relative: 29 %
Lymphs Abs: 0.8 10*3/uL (ref 0.7–4.0)
MCH: 28.4 pg (ref 26.0–34.0)
MCHC: 30.6 g/dL (ref 30.0–36.0)
MCV: 92.8 fL (ref 80.0–100.0)
Monocytes Absolute: 0.4 10*3/uL (ref 0.1–1.0)
Monocytes Relative: 15 %
Neutro Abs: 1.4 10*3/uL — ABNORMAL LOW (ref 1.7–7.7)
Neutrophils Relative %: 53 %
Platelets: 220 10*3/uL (ref 150–400)
RBC: 2.5 MIL/uL — ABNORMAL LOW (ref 4.22–5.81)
RDW: 12.4 % (ref 11.5–15.5)
WBC: 2.6 10*3/uL — ABNORMAL LOW (ref 4.0–10.5)
nRBC: 0 % (ref 0.0–0.2)

## 2023-06-13 LAB — COMPREHENSIVE METABOLIC PANEL
ALT: 18 U/L (ref 0–44)
AST: 25 U/L (ref 15–41)
Albumin: 3.5 g/dL (ref 3.5–5.0)
Alkaline Phosphatase: 55 U/L (ref 38–126)
Anion gap: 9 (ref 5–15)
BUN: 47 mg/dL — ABNORMAL HIGH (ref 8–23)
CO2: 20 mmol/L — ABNORMAL LOW (ref 22–32)
Calcium: 9.2 mg/dL (ref 8.9–10.3)
Chloride: 110 mmol/L (ref 98–111)
Creatinine, Ser: 3.88 mg/dL — ABNORMAL HIGH (ref 0.61–1.24)
GFR, Estimated: 15 mL/min — ABNORMAL LOW (ref >60)
Glucose, Bld: 95 mg/dL (ref 70–99)
Potassium: 4.5 mmol/L (ref 3.5–5.1)
Sodium: 139 mmol/L (ref 135–145)
Total Bilirubin: 0.4 mg/dL (ref 0.0–1.2)
Total Protein: 8.3 g/dL — ABNORMAL HIGH (ref 6.5–8.1)

## 2023-06-13 LAB — URINALYSIS, ROUTINE W REFLEX MICROSCOPIC
Bacteria, UA: NONE SEEN
Bilirubin Urine: NEGATIVE
Glucose, UA: 50 mg/dL — AB
Ketones, ur: NEGATIVE mg/dL
Leukocytes,Ua: NEGATIVE
Nitrite: NEGATIVE
Protein, ur: 100 mg/dL — AB
Specific Gravity, Urine: 1.011 (ref 1.005–1.030)
pH: 6 (ref 5.0–8.0)

## 2023-06-13 LAB — CBG MONITORING, ED: Glucose-Capillary: 87 mg/dL (ref 70–99)

## 2023-06-13 LAB — IRON AND TIBC
Iron: 35 ug/dL — ABNORMAL LOW (ref 45–182)
Saturation Ratios: 7 % — ABNORMAL LOW (ref 17.9–39.5)
TIBC: 501 ug/dL — ABNORMAL HIGH (ref 250–450)
UIBC: 466 ug/dL

## 2023-06-13 LAB — RETICULOCYTES
Immature Retic Fract: 14 % (ref 2.3–15.9)
RBC.: 2.49 MIL/uL — ABNORMAL LOW (ref 4.22–5.81)
Retic Count, Absolute: 32.1 10*3/uL (ref 19.0–186.0)
Retic Ct Pct: 1.3 % (ref 0.4–3.1)

## 2023-06-13 LAB — PREPARE RBC (CROSSMATCH)

## 2023-06-13 LAB — VITAMIN B12: Vitamin B-12: 323 pg/mL (ref 180–914)

## 2023-06-13 LAB — POC OCCULT BLOOD, ED: Fecal Occult Bld: NEGATIVE

## 2023-06-13 LAB — FOLATE: Folate: 7.9 ng/mL (ref >5.9)

## 2023-06-13 LAB — FERRITIN: Ferritin: 15 ng/mL — ABNORMAL LOW (ref 24–336)

## 2023-06-13 MED ORDER — LACTATED RINGERS IV BOLUS
1000.0000 mL | Freq: Once | INTRAVENOUS | Status: AC
  Administered 2023-06-13: 1000 mL via INTRAVENOUS

## 2023-06-13 MED ORDER — ACETAMINOPHEN 650 MG RE SUPP: 650.0000 mg | Freq: Four times a day (QID) | RECTAL | Status: DC | PRN

## 2023-06-13 MED ORDER — ACETAMINOPHEN 325 MG PO TABS: 650.0000 mg | ORAL_TABLET | Freq: Four times a day (QID) | ORAL | Status: DC | PRN

## 2023-06-13 MED ORDER — ONDANSETRON HCL 4 MG PO TABS: 4.0000 mg | ORAL_TABLET | Freq: Four times a day (QID) | ORAL | Status: DC | PRN

## 2023-06-13 MED ORDER — PANTOPRAZOLE SODIUM 40 MG IV SOLR
40.0000 mg | INTRAVENOUS | Status: DC
  Administered 2023-06-13: 40 mg via INTRAVENOUS

## 2023-06-13 MED ORDER — ATORVASTATIN CALCIUM 40 MG PO TABS
40.0000 mg | ORAL_TABLET | Freq: Every day | ORAL | Status: DC
  Administered 2023-06-14 – 2023-06-20 (×7): 40 mg via ORAL
  Filled 2023-06-13 (×7): qty 1

## 2023-06-13 MED ORDER — HYDRALAZINE HCL 25 MG PO TABS
25.0000 mg | ORAL_TABLET | Freq: Three times a day (TID) | ORAL | Status: DC
Start: 2023-06-13 — End: 2023-06-20
  Administered 2023-06-13 – 2023-06-20 (×21): 25 mg via ORAL
  Filled 2023-06-13 (×21): qty 1

## 2023-06-13 MED ORDER — HYDRALAZINE HCL 20 MG/ML IJ SOLN: 5.0000 mg | Freq: Four times a day (QID) | INTRAMUSCULAR | Status: DC | PRN

## 2023-06-13 MED ORDER — SODIUM CHLORIDE 0.45 % IV SOLN: INTRAVENOUS | Status: DC

## 2023-06-13 MED ORDER — POLYSACCHARIDE IRON COMPLEX 150 MG PO CAPS: 150.0000 mg | ORAL_CAPSULE | Freq: Every day | ORAL | Status: DC

## 2023-06-13 MED ORDER — ONDANSETRON HCL 4 MG/2ML IJ SOLN: 4.0000 mg | Freq: Four times a day (QID) | INTRAMUSCULAR | Status: DC | PRN

## 2023-06-13 MED ORDER — AMLODIPINE BESYLATE 5 MG PO TABS
5.0000 mg | ORAL_TABLET | Freq: Every day | ORAL | Status: DC
  Administered 2023-06-13 – 2023-06-17 (×5): 5 mg via ORAL
  Filled 2023-06-13 (×5): qty 1

## 2023-06-13 MED ORDER — SODIUM CHLORIDE 0.9% IV SOLUTION: Freq: Once | INTRAVENOUS | Status: AC

## 2023-06-13 NOTE — ED Notes (Signed)
 Gregory Nunez [phone] for transport home.

## 2023-06-13 NOTE — Hospital Course (Addendum)
 78 year old man PMH including stroke with some slurred speech and confusion at baseline, CKD, hepatitis C, presented with intermittent confusion and blurred vision.  Admitted for acute on chronic anemia, symptomatic with weakness.  Treated with blood transfusion.  Hemoglobin stabilized.  AKI versus progression of CKD.  At this point favor progression.  Plan for SNF.  Consultants None   Procedures/Events None

## 2023-06-13 NOTE — ED Provider Notes (Addendum)
 [place] EMERGENCY DEPARTMENT AT [facility] Provider Note   CSN: [id number] Arrival date & time: 06/13/23  1203     History  Chief Complaint  Patient presents with  . Altered Mental Status    Gregory Nunez. is a 78 y.o. male.  Pt presents from snf with hx intermittent confusion.  Pt is noted to have some altered speech/mild aphasia at baseline - prior stroke. Patient is currently awake and alert, mental status, speech, reported as currently c/w baseline. Pt denies any c/o, indicates he feels fine. Pt responds to questions appropriately. Denies pain. No headache. No chest pain or discomfort. No sob. Denies cough or uri symptoms. No abd pain or nvd. No dysuria or gu c/o. No extremity pain or swelling. Denies trauma/fall. Denies fever/chills. Pt does appear pale - denies acute bleeding, denies melena or rectal bleeding. No faintness or dizziness. Hx fe def anemia.   The history is provided by the patient, medical records and the EMS personnel. The history is limited by the condition of the patient.  Altered Mental Status Associated symptoms: no abdominal pain, no fever, no headaches, no vomiting and no weakness        Home Medications Prior to Admission medications   Medication Sig Start Date End Date Taking? Authorizing Provider  amLODipine (NORVASC) 5 MG tablet Take 1 tablet (5 mg total) by mouth daily. 10/14/22   Steffanie Rainwater, MD  Glecaprevir-Pibrentasvir 100-40 MG TABS Take 3 tablets by mouth daily. 11/23/22   Ginnie Smart, MD  hydrALAZINE (APRESOLINE) 25 MG tablet Take 1 tablet (25 mg total) by mouth every 8 (eight) hours. 10/14/22   Steffanie Rainwater, MD  iron polysaccharides (NIFEREX) 150 MG capsule Take 1 capsule (150 mg total) by mouth daily. 08/17/22   Atway, Rayann N, DO  losartan (COZAAR) 50 MG tablet Take 1 tablet (50 mg total) by mouth daily. 10/14/22   Steffanie Rainwater, MD  COMBIVENT RESPIMAT 20-100 MCG/ACT AERS respimat Inhale 1 puff  into the lungs every 6 (six) hours as needed for shortness of breath. 06/12/18 08/19/19  [provider]  mometasone-formoterol (DULERA) 100-5 MCG/ACT AERO Inhale 2 puffs into the lungs daily. Patient not taking: Reported on 08/19/2019 04/17/18 08/19/19  Berton Mount I, MD  sucralfate (CARAFATE) 1 g tablet Take 1 tablet (1 g total) by mouth 4 (four) times daily. Patient not taking: Reported on 08/19/2019 04/17/18 08/19/19  Berton Mount I, MD  tiotropium (SPIRIVA HANDIHALER) 18 MCG inhalation capsule Place 1 capsule (18 mcg total) into inhaler and inhale daily. Patient not taking: Reported on 08/19/2019 04/17/18 08/19/19  Berton Mount I, MD  traZODone (DESYREL) 50 MG tablet Take 50 mg by mouth at bedtime as needed for sleep. Patient not taking: Reported on 10/06/2020  10/08/20  [provider]      Allergies    Penicillins    Review of Systems   Review of Systems  Constitutional:  Negative for fever.  HENT:  Negative for sore throat.   Eyes:  Negative for visual disturbance.  Respiratory:  Negative for cough and shortness of breath.   Cardiovascular:  Negative for chest pain.  Gastrointestinal:  Negative for abdominal pain, blood in stool, diarrhea and vomiting.  Genitourinary:  Negative for dysuria and flank pain.  Musculoskeletal:  Negative for back pain and neck pain.  Neurological:  Negative for weakness, numbness and headaches.    Physical Exam Updated Vital Signs BP (!) 149/82   Pulse 88  Temp 98 F (36.7 C) (Oral)   Resp 16   Ht 1.854 m (6\' 1" )   Wt 72.5 kg   SpO2 100%   BMI 21.09 kg/m  Physical Exam Vitals and nursing note reviewed.  Constitutional:      Appearance: Normal appearance. He is well-developed.  HENT:     Head: Atraumatic.     Comments: No sinus or temporal tenderness.     Nose: Nose normal.     Mouth/Throat:     Mouth: Mucous membranes are moist.     Pharynx: Oropharynx is clear.  Eyes:     General: No scleral icterus.     Pupils: Pupils are equal, round, and reactive to light.  Neck:     Vascular: No carotid bruit.     Trachea: No tracheal deviation.     Comments: No stiffness or rigidity.  Cardiovascular:     Rate and Rhythm: Normal rate and regular rhythm.     Pulses: Normal pulses.     Heart sounds: Normal heart sounds. No murmur heard.    No friction rub. No gallop.  Pulmonary:     Effort: Pulmonary effort is normal. No accessory muscle usage or respiratory distress.     Breath sounds: Normal breath sounds.  Abdominal:     General: Bowel sounds are normal. There is no distension.     Palpations: Abdomen is soft. There is no mass.     Tenderness: There is no abdominal tenderness.  Genitourinary:    Comments: No cva tenderness. Medium brown stool - sent for hemoccult  Musculoskeletal:        General: No swelling or tenderness.     Cervical back: Normal range of motion and neck supple. No rigidity.     Right lower leg: No edema.     Left lower leg: No edema.  Skin:    General: Skin is warm and dry.     Coloration: Skin is pale.     Findings: No rash.  Neurological:     Mental Status: He is alert.     Comments: Alert, speech clear, responds to questions appropriately. Motor/sens grossly intact bil. No pronator drift.   Psychiatric:        Mood and Affect: Mood normal.    ED Results / Procedures / Treatments   Labs (all labs ordered are listed, but only abnormal results are displayed) Results for orders placed or performed during the hospital encounter of 06/13/23  Comprehensive metabolic panel   Collection Time: 06/13/23  1:16 PM  Result Value Ref Range   Sodium 139 135 - 145 mmol/L   Potassium 4.5 3.5 - 5.1 mmol/L   Chloride 110 98 - 111 mmol/L   CO2 20 (L) 22 - 32 mmol/L   Glucose, Bld 95 70 - 99 mg/dL   BUN 47 (H) 8 - 23 mg/dL   Creatinine, Ser 1.61 (H) 0.61 - 1.24 mg/dL   Calcium 9.2 8.9 - 09.6 mg/dL   Total Protein 8.3 (H) 6.5 - 8.1 g/dL   Albumin 3.5 3.5 - 5.0 g/dL   AST 25 15  - 41 U/L   ALT 18 0 - 44 U/L   Alkaline Phosphatase 55 38 - 126 U/L   Total Bilirubin 0.4 0.0 - 1.2 mg/dL   GFR, Estimated 15 (L) >60 mL/min   Anion gap 9 5 - 15  CBC with Differential   Collection Time: 06/13/23  1:16 PM  Result Value Ref Range   WBC 2.6 (L)  4.0 - 10.5 K/uL   RBC 2.50 (L) 4.22 - 5.81 MIL/uL   Hemoglobin 7.1 (L) 13.0 - 17.0 g/dL   HCT 91.4 (L) 78.2 - 95.6 %   MCV 92.8 80.0 - 100.0 fL   MCH 28.4 26.0 - 34.0 pg   MCHC 30.6 30.0 - 36.0 g/dL   RDW 21.3 08.6 - 57.8 %   Platelets 220 150 - 400 K/uL   nRBC 0.0 0.0 - 0.2 %   Neutrophils Relative % 53 %   Neutro Abs 1.4 (L) 1.7 - 7.7 K/uL   Lymphocytes Relative 29 %   Lymphs Abs 0.8 0.7 - 4.0 K/uL   Monocytes Relative 15 %   Monocytes Absolute 0.4 0.1 - 1.0 K/uL   Eosinophils Relative 3 %   Eosinophils Absolute 0.1 0.0 - 0.5 K/uL   Basophils Relative 0 %   Basophils Absolute 0.0 0.0 - 0.1 K/uL   Immature Granulocytes 0 %   Abs Immature Granulocytes 0.00 0.00 - 0.07 K/uL  CBG monitoring, ED   Collection Time: 06/13/23  1:23 PM  Result Value Ref Range   Glucose-Capillary 87 70 - 99 mg/dL     EKG None  Radiology No results found.  Procedures Procedures    Medications Ordered in ED Medications  lactated ringers bolus 1,000 mL (has no administration in time range)    ED Course/ Medical Decision Making/ A&P Clinical Course as of 06/13/23 1710  Mon Jun 13, 2023  1706 Hx prior stroke with baseline slurred speech.  Here with weakness, confusion.  Mental status baseline here.  AKI on CKD here, AoC anemia getting blood. [JD]    Clinical Course User Index [JD] Laurence Spates, MD                                 Medical Decision Making Problems Addressed: AKI (acute kidney injury) [facility]): acute illness or injury with systemic symptoms that poses a threat to life or bodily functions Anemia, chronic disease: chronic illness or injury with exacerbation, progression, or side effects of treatment that poses a  threat to life or bodily functions Elevated blood pressure reading: acute illness or injury Essential hypertension: chronic illness or injury Generalized weakness: acute illness or injury with systemic symptoms Stage 4 chronic kidney disease (HCC): chronic illness or injury with exacerbation, progression, or side effects of treatment that poses a threat to life or bodily functions Symptomatic anemia: acute illness or injury with systemic symptoms that poses a threat to life or bodily functions  Amount and/or Complexity of Data Reviewed Independent Historian: EMS    Details: hx External Data Reviewed: labs and notes. Labs: ordered. Decision-making details documented in ED Course. Radiology: ordered and independent interpretation performed. Decision-making details documented in ED Course. Discussion of management or test interpretation with external provider(s): medicine  Risk Prescription drug management. Decision regarding hospitalization.   Labs ordered/sent.   Differential diagnosis includes hypoglycemia, dehydration, worsening anemia, aki, etc. Dispo decision including potential need for admission considered - will get labs and reassess.   Reviewed nursing notes and prior charts for additional history. External reports reviewed. Additional history from: EMS.   Cardiac monitor: sinus rhythm, rate 88.  Labs reviewed/interpreted by me - aki on ckd. K normal. Hgb 7, decreased from prior chr anemia.   LR bolus. Type and screen. Anemia panel added. Place in room for hemoccult.   Transfuse 1 unit prbc.   Hospitalists consulted  for admission. Discussed pt  - will admit.            Final Clinical Impression(s) / ED Diagnoses Final diagnoses:  Generalized weakness  Symptomatic anemia  AKI (acute kidney injury) (HCC)  Stage 4 chronic kidney disease (HCC)  Anemia, chronic disease  Elevated blood pressure reading  Essential hypertension    Rx / DC Orders ED Discharge  Orders     None            Cathren Laine, MD 06/13/23 1711

## 2023-06-13 NOTE — H&P (Signed)
 History and Physical    Gregory Nunez. UJW:[id number] DOB: [birth year] DOA: 06/13/2023  PCP: Champ Mungo, DO   Patient coming from: facility  Chief Complaint  Patient presents with   Altered Mental Status   HPI: 78 year old male with previous history of stroke at baseline with slurred speech and some altered mental status, HTN, CKD stage IV, history hepatitis C presented with intermittent confusion, intermittent blurry vision.  He appeared pale in the ED and felt to be with baseline mental status, he still was medium brown sent for Hemoccult. He denies any fever chills chest pain nauseas vomiting or abdomen pain.he is able to tell me he had stroke and "that's what messed me up" he has speech slurring, he is moving all extremities on command In the ED, vitals afebrile BP 149/82, saturating 100%, labs with BUN 47/creatinine 3.8 baseline~ 39/2.4 WBC 2.6 hemoglobin 7.1.  Previous baseline 9 to 10 g in 2024. 1 unit PRBC ordered and admission was requested for further management of acute on chronic anemia, AKI on CKD stage IV.   Assessment/Plan Active Problems:   Seizure disorder (HCC)   Type 2 diabetes with complication (HCC)   History of CVA (cerebrovascular accident)   Hyperlipidemia associated with type 2 diabetes mellitus (HCC)   CKD (chronic kidney disease) stage 4, GFR 15-29 ml/min (HCC)   Iron deficiency anemia   Symptomatic anemia  Acute on Chronic anemia Anemia of CKD Hx of angiodysplasia Symptomatic anemia with weakness.  Follow-up Hemoccult, anemia panel.  1 unit PRBC has been ordered we will transfusion monitor H&H. Not sure if he is compliant with iron supplement. Add ppi bid for now. Recent Labs  Lab 06/13/23 1316  HGB 7.1*  HCT 23.2*   HTN: BP poorly controlled. He says he has not taken his BP meds in 2 wks (states they have not been giving it) resume amlodipine hydralazine, add prn iv.  AKI on CKD stage IV w/ mild metabolic acidosis: baseline~ 39/2.4 mild  aki, hopefully will improve with prbc and gentle ivf.  Avoid nephrotoxic medication.?  Question if its progression of his CKD. Recent Labs    08/13/22 1128 08/13/22 1221 08/14/22 0610 08/15/22 0305 08/16/22 0327 10/14/22 1016 06/13/23 1316  BUN 20 23 25* 36* 41* 39* 47*  CREATININE 2.26* 2.40* 2.39* 2.63* 2.83* 2.46* 3.88*  CO2 21*  --  22 22 20* 21 20*  K 3.3* 3.4* 3.0* 3.2* 3.7 3.8 4.5    history hepatitis C: Check lfts.  Leukopenia: Monitor  History of diabetes HLD: Check hba1c. Cbg stable. Recent Labs  Lab 06/13/23 1323  GLUCAP 87    Hx of stroke NH resident: at baseline with slurred speech and some altered mental status-there is concern for possible mild altered mental status but currently at presentation alert awake interactive and at baseline  Body mass index is 21.09 kg/m.   Severity of Illness: The appropriate patient status for this patient is OBSERVATION. Observation status is judged to be reasonable and necessary in order to provide the required intensity of service to ensure the patient's safety. The patient's presenting symptoms, physical exam findings, and initial radiographic and laboratory data in the context of their medical condition is felt to place them at decreased risk for further clinical deterioration. Furthermore, it is anticipated that the patient will be medically stable for discharge from the hospital within 2 midnights of admission.    DVT prophylaxis: SCDs Start: 06/13/23 1730 Code Status:   Code Status: Full Code  Family  Communication: Admission, patients condition and plan of care including tests being ordered have been discussed with the patient  who indicate understanding and agree with the plan and Code Status.  Consults called:  None  Review of Systems: All systems were reviewed and were negative except as mentioned in HPI above. Negative for fever Negative for chest pain Negative for shortness of breath  Past Medical History:   Diagnosis Date   Acute metabolic encephalopathy 10/22/2020   Acute metabolic encephalopathy 10/22/2020   Alcohol use 04/23/2020   Alcohol withdrawal seizure with complication, with unspecified complication (HCC) 11/08/2018   CAO (chronic airflow obstruction) (HCC)    Cataract    OD   CVA (cerebral vascular accident) (HCC)    Depression    Diabetes mellitus without complication (HCC)    Diabetic retinopathy (HCC)    NPDR OU   GERD (gastroesophageal reflux disease)    if drinks alcohol   GI bleed 10/05/2020   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   Patient is approximately 5-week status post bunion correction left foot   Hyperlipidemia    Hypertension    Hypertensive retinopathy    OU   Hypoglycemia 10/22/2020   Malignant neoplasm of prostate (HCC) 01/09/2014   Neuropathy    Pancreatitis    Pneumococcal vaccination administered at current visit 11/10/2020   Prostate cancer (HCC) 12/19/2013   Gleason 4+3=7, volume 31.31 cc   Rhabdomyolysis 04/12/2021   Sciatica    Shortness of breath dyspnea    with exertion     Past Surgical History:  Procedure Laterality Date   BIOPSY  04/16/2018   Procedure: BIOPSY;  Surgeon: Charna Elizabeth, MD;  Location: [facility] ENDOSCOPY;  Service: Endoscopy;;   BIOPSY  05/23/2020   Procedure: BIOPSY;  Surgeon: Lynann Bologna, MD;  Location: [facility] ENDOSCOPY;  Service: Gastroenterology;;  EGD and COLON   BIOPSY  12/09/2020   Procedure: BIOPSY;  Surgeon: Tressia Danas, MD;  Location: [facility] ENDOSCOPY;  Service: Gastroenterology;;   biopsy on throat     hx of    CATARACT EXTRACTION Bilateral    Dr. Baker Pierini   COLONOSCOPY N/A 05/23/2020   Procedure: COLONOSCOPY;  Surgeon: Lynann Bologna, MD;  Location: [facility] ENDOSCOPY;  Service: Gastroenterology;  Laterality: N/A;   ENTEROSCOPY N/A 10/07/2020   Procedure: ENTEROSCOPY;  Surgeon: Iva Boop, MD;  Location: [facility] ENDOSCOPY;  Service: Endoscopy;  Laterality: N/A;   ESOPHAGOGASTRODUODENOSCOPY Left 04/16/2018   Procedure:  ESOPHAGOGASTRODUODENOSCOPY (EGD);  Surgeon: Charna Elizabeth, MD;  Location: [facility] ENDOSCOPY;  Service: Endoscopy;  Laterality: Left;   ESOPHAGOGASTRODUODENOSCOPY (EGD) WITH PROPOFOL N/A 05/23/2020   Procedure: ESOPHAGOGASTRODUODENOSCOPY (EGD) WITH PROPOFOL;  Surgeon: Lynann Bologna, MD;  Location: [facility] ENDOSCOPY;  Service: Gastroenterology;  Laterality: N/A;   ESOPHAGOGASTRODUODENOSCOPY (EGD) WITH PROPOFOL N/A 12/09/2020   Procedure: ESOPHAGOGASTRODUODENOSCOPY (EGD) WITH PROPOFOL;  Surgeon: Tressia Danas, MD;  Location: [facility] ENDOSCOPY;  Service: Gastroenterology;  Laterality: N/A;   EYE SURGERY     FOOT SURGERY     HOT HEMOSTASIS N/A 04/16/2018   Procedure: HOT HEMOSTASIS (ARGON PLASMA COAGULATION/BICAP);  Surgeon: Charna Elizabeth, MD;  Location: [facility] ENDOSCOPY;  Service: Endoscopy;  Laterality: N/A;   HOT HEMOSTASIS N/A 05/23/2020   Procedure: HOT HEMOSTASIS (ARGON PLASMA COAGULATION/BICAP);  Surgeon: Lynann Bologna, MD;  Location: [facility] ENDOSCOPY;  Service: Gastroenterology;  Laterality: N/A;   HOT HEMOSTASIS N/A 12/09/2020   Procedure: HOT HEMOSTASIS (ARGON PLASMA COAGULATION/BICAP);  Surgeon: Tressia Danas, MD;  Location: [facility] ENDOSCOPY;  Service: Gastroenterology;  Laterality: N/A;   LYMPHADENECTOMY Bilateral 02/27/2014  Procedure: BILATERAL LYMPHADENECTOMY;  Surgeon: Sebastian Ache, MD;  Location: [facility] ORS;  Service: Urology;  Laterality: Bilateral;   POLYPECTOMY  05/23/2020   Procedure: POLYPECTOMY;  Surgeon: Lynann Bologna, MD;  Location: [facility] ENDOSCOPY;  Service: Gastroenterology;;   PROSTATE BIOPSY  12/2013   Gleason 4+3=7, volume 31.31 cc   ROBOT ASSISTED LAPAROSCOPIC RADICAL PROSTATECTOMY N/A 02/27/2014   Procedure: ROBOTIC ASSISTED LAPAROSCOPIC RADICAL PROSTATECTOMY WITH INDOCYANINE GREEN DYE;  Surgeon: Sebastian Ache, MD;  Location: [facility] ORS;  Service: Urology;  Laterality: N/A;     reports that he has been smoking cigarettes. He has never used smokeless tobacco. He reports that he does not currently use  alcohol after a past usage of about 1.0 standard drink of alcohol per week. He reports that he does not currently use drugs after having used the following drugs: Marijuana, Cocaine, and Heroin.  Allergies  Allergen Reactions   Penicillins Other (See Comments)    Pt does not remember reaction but states he woke up in the hospital after taking      Family History  Problem Relation Age of Onset   Heart disease Mother    Heart attack Father 34   Cancer Sister        breast   Colon cancer Neg Hx    Esophageal cancer Neg Hx    Rectal cancer Neg Hx    Stomach cancer Neg Hx      Prior to Admission medications   Medication Sig Start Date End Date Taking? Authorizing Provider  amLODipine (NORVASC) 5 MG tablet Take 1 tablet (5 mg total) by mouth daily. 10/14/22   Steffanie Rainwater, MD  Glecaprevir-Pibrentasvir 100-40 MG TABS Take 3 tablets by mouth daily. 11/23/22   Ginnie Smart, MD  hydrALAZINE (APRESOLINE) 25 MG tablet Take 1 tablet (25 mg total) by mouth every 8 (eight) hours. 10/14/22   Steffanie Rainwater, MD  iron polysaccharides (NIFEREX) 150 MG capsule Take 1 capsule (150 mg total) by mouth daily. 08/17/22   Atway, Rayann N, DO  losartan (COZAAR) 50 MG tablet Take 1 tablet (50 mg total) by mouth daily. 10/14/22   Steffanie Rainwater, MD  COMBIVENT RESPIMAT 20-100 MCG/ACT AERS respimat Inhale 1 puff into the lungs every 6 (six) hours as needed for shortness of breath. 06/12/18 08/19/19  [provider]  mometasone-formoterol (DULERA) 100-5 MCG/ACT AERO Inhale 2 puffs into the lungs daily. Patient not taking: Reported on 08/19/2019 04/17/18 08/19/19  Berton Mount I, MD  sucralfate (CARAFATE) 1 g tablet Take 1 tablet (1 g total) by mouth 4 (four) times daily. Patient not taking: Reported on 08/19/2019 04/17/18 08/19/19  Berton Mount I, MD  tiotropium (SPIRIVA HANDIHALER) 18 MCG inhalation capsule Place 1 capsule (18 mcg total) into inhaler and inhale daily. Patient not  taking: Reported on 08/19/2019 04/17/18 08/19/19  Berton Mount I, MD  traZODone (DESYREL) 50 MG tablet Take 50 mg by mouth at bedtime as needed for sleep. Patient not taking: Reported on 10/06/2020  10/08/20  [provider]    Physical Exam: Vitals:   06/13/23 1213 06/13/23 1215  BP: (!) 149/82   Pulse: 88   Resp: 16   Temp: 98 F (36.7 C)   TempSrc: Oral   SpO2: 100%   Weight:  72.5 kg  Height:  6\' 1"  (1.854 m)   General exam: AAOx2, NAD, weak appearing. HEENT:Oral mucosa moist, Ear/Nose WNL grossly, dentition normal. Respiratory system: bilaterally clear,no wheezing or crackles,no use of accessory muscle Cardiovascular system: S1 &  S2 +, No JVD,. Gastrointestinal system: Abdomen soft, NT,ND, BS+ Nervous System:Alert, awake, moving extremities well non focal, speech dysarthric Extremities: No edema, distal peripheral pulses palpable.  Skin: No rashes,no icterus. MSK: Normal muscle bulk,tone, power   Labs on Admission: I have personally reviewed following labs and imaging studies  CBC: Recent Labs  Lab 06/13/23 1316  WBC 2.6*  NEUTROABS 1.4*  HGB 7.1*  HCT 23.2*  MCV 92.8  PLT 220   Basic Metabolic Panel: Recent Labs  Lab 06/13/23 1316  NA 139  K 4.5  CL 110  CO2 20*  GLUCOSE 95  BUN 47*  CREATININE 3.88*  CALCIUM 9.2   Estimated Creatinine Clearance: 16.9 mL/min (A) (by C-G formula based on SCr of 3.88 mg/dL (H)). Recent Labs  Lab 06/13/23 1316  AST 25  ALT 18  ALKPHOS 55  BILITOT 0.4  PROT 8.3*  ALBUMIN 3.5  No results for input(s): "HGBA1C" in the last 72 hours. CBG: Recent Labs  Lab 06/13/23 1323  GLUCAP 87   Lipid Profile: No results for input(s): "CHOL", "HDL", "LDLCALC", "TRIG", "CHOLHDL", "LDLDIRECT" in the last 72 hours. Thyroid Function Tests: No results for input(s): "TSH", "T4TOTAL", "FREET4", "T3FREE", "THYROIDAB" in the last 72 hours. Urine analysis:    Component Value Date/Time   COLORURINE YELLOW 08/13/2022 1300    APPEARANCEUR CLEAR 08/13/2022 1300   LABSPEC 1.011 08/13/2022 1300   PHURINE 6.0 08/13/2022 1300   GLUCOSEU 50 (A) 08/13/2022 1300   HGBUR SMALL (A) 08/13/2022 1300   BILIRUBINUR NEGATIVE 08/13/2022 1300   KETONESUR NEGATIVE 08/13/2022 1300   PROTEINUR >=300 (A) 08/13/2022 1300   UROBILINOGEN 1.0 01/10/2019 1132   NITRITE NEGATIVE 08/13/2022 1300   LEUKOCYTESUR NEGATIVE 08/13/2022 1300    Radiological Exams on Admission: No results found.    Lanae Boast MD [facility]  If 7PM-7AM, please contact night-coverage [email or web address]  06/13/2023, 5:29 PM

## 2023-06-13 NOTE — ED Triage Notes (Signed)
 Intermittent confusion and intermittent blurred vision. Pt has hx of previous stroke so has slurred speech at baseline and is altered some at baseline. Pt is currently at baseline, but has the intermittent confusion, so staff wanted pt evaluated.  LKN was last Thursday.

## 2023-06-14 DIAGNOSIS — I129 Hypertensive chronic kidney disease with stage 1 through stage 4 chronic kidney disease, or unspecified chronic kidney disease: Secondary | ICD-10-CM | POA: Diagnosis present

## 2023-06-14 DIAGNOSIS — E872 Acidosis, unspecified: Secondary | ICD-10-CM | POA: Diagnosis present

## 2023-06-14 DIAGNOSIS — N179 Acute kidney failure, unspecified: Secondary | ICD-10-CM | POA: Diagnosis present

## 2023-06-14 DIAGNOSIS — D649 Anemia, unspecified: Secondary | ICD-10-CM | POA: Diagnosis not present

## 2023-06-14 DIAGNOSIS — I69322 Dysarthria following cerebral infarction: Secondary | ICD-10-CM | POA: Diagnosis not present

## 2023-06-14 DIAGNOSIS — E785 Hyperlipidemia, unspecified: Secondary | ICD-10-CM | POA: Diagnosis present

## 2023-06-14 DIAGNOSIS — N184 Chronic kidney disease, stage 4 (severe): Secondary | ICD-10-CM | POA: Diagnosis present

## 2023-06-14 DIAGNOSIS — I69320 Aphasia following cerebral infarction: Secondary | ICD-10-CM | POA: Diagnosis not present

## 2023-06-14 DIAGNOSIS — T461X6A Underdosing of calcium-channel blockers, initial encounter: Secondary | ICD-10-CM | POA: Diagnosis present

## 2023-06-14 DIAGNOSIS — E118 Type 2 diabetes mellitus with unspecified complications: Secondary | ICD-10-CM | POA: Diagnosis not present

## 2023-06-14 DIAGNOSIS — D509 Iron deficiency anemia, unspecified: Secondary | ICD-10-CM | POA: Diagnosis not present

## 2023-06-14 DIAGNOSIS — G40909 Epilepsy, unspecified, not intractable, without status epilepticus: Secondary | ICD-10-CM | POA: Diagnosis present

## 2023-06-14 DIAGNOSIS — Z79899 Other long term (current) drug therapy: Secondary | ICD-10-CM | POA: Diagnosis not present

## 2023-06-14 DIAGNOSIS — D72819 Decreased white blood cell count, unspecified: Secondary | ICD-10-CM | POA: Diagnosis present

## 2023-06-14 DIAGNOSIS — E113293 Type 2 diabetes mellitus with mild nonproliferative diabetic retinopathy without macular edema, bilateral: Secondary | ICD-10-CM | POA: Diagnosis present

## 2023-06-14 DIAGNOSIS — Z8249 Family history of ischemic heart disease and other diseases of the circulatory system: Secondary | ICD-10-CM | POA: Diagnosis not present

## 2023-06-14 DIAGNOSIS — Z8546 Personal history of malignant neoplasm of prostate: Secondary | ICD-10-CM | POA: Diagnosis not present

## 2023-06-14 DIAGNOSIS — E1122 Type 2 diabetes mellitus with diabetic chronic kidney disease: Secondary | ICD-10-CM | POA: Diagnosis present

## 2023-06-14 DIAGNOSIS — Z880 Allergy status to penicillin: Secondary | ICD-10-CM | POA: Diagnosis not present

## 2023-06-14 DIAGNOSIS — E1169 Type 2 diabetes mellitus with other specified complication: Secondary | ICD-10-CM | POA: Diagnosis present

## 2023-06-14 DIAGNOSIS — Z7951 Long term (current) use of inhaled steroids: Secondary | ICD-10-CM | POA: Diagnosis not present

## 2023-06-14 DIAGNOSIS — J449 Chronic obstructive pulmonary disease, unspecified: Secondary | ICD-10-CM | POA: Diagnosis present

## 2023-06-14 DIAGNOSIS — T465X6A Underdosing of other antihypertensive drugs, initial encounter: Secondary | ICD-10-CM | POA: Diagnosis present

## 2023-06-14 DIAGNOSIS — R410 Disorientation, unspecified: Secondary | ICD-10-CM | POA: Diagnosis present

## 2023-06-14 DIAGNOSIS — F17210 Nicotine dependence, cigarettes, uncomplicated: Secondary | ICD-10-CM | POA: Diagnosis present

## 2023-06-14 DIAGNOSIS — Z8619 Personal history of other infectious and parasitic diseases: Secondary | ICD-10-CM | POA: Diagnosis not present

## 2023-06-14 DIAGNOSIS — R4182 Altered mental status, unspecified: Secondary | ICD-10-CM | POA: Diagnosis present

## 2023-06-14 DIAGNOSIS — D631 Anemia in chronic kidney disease: Secondary | ICD-10-CM | POA: Diagnosis present

## 2023-06-14 LAB — BASIC METABOLIC PANEL
Anion gap: 10 (ref 5–15)
BUN: 45 mg/dL — ABNORMAL HIGH (ref 8–23)
CO2: 21 mmol/L — ABNORMAL LOW (ref 22–32)
Calcium: 9.2 mg/dL (ref 8.9–10.3)
Chloride: 109 mmol/L (ref 98–111)
Creatinine, Ser: 3.41 mg/dL — ABNORMAL HIGH (ref 0.61–1.24)
GFR, Estimated: 18 mL/min — ABNORMAL LOW (ref >60)
Glucose, Bld: 88 mg/dL (ref 70–99)
Potassium: 4.1 mmol/L (ref 3.5–5.1)
Sodium: 140 mmol/L (ref 135–145)

## 2023-06-14 LAB — CBC
HCT: 24.4 % — ABNORMAL LOW (ref 39.0–52.0)
Hemoglobin: 7.7 g/dL — ABNORMAL LOW (ref 13.0–17.0)
MCH: 28.6 pg (ref 26.0–34.0)
MCHC: 31.6 g/dL (ref 30.0–36.0)
MCV: 90.7 fL (ref 80.0–100.0)
Platelets: 187 10*3/uL (ref 150–400)
RBC: 2.69 MIL/uL — ABNORMAL LOW (ref 4.22–5.81)
RDW: 12.5 % (ref 11.5–15.5)
WBC: 4.2 10*3/uL (ref 4.0–10.5)
nRBC: 0 % (ref 0.0–0.2)

## 2023-06-14 LAB — TYPE AND SCREEN
ABO/RH(D): O POS
Antibody Screen: NEGATIVE
Unit division: 0

## 2023-06-14 LAB — BPAM RBC
Blood Product Expiration Date: 202503242359
ISSUE DATE / TIME: 202502242047
Unit Type and Rh: 5100

## 2023-06-14 MED ORDER — PANTOPRAZOLE SODIUM 40 MG PO TBEC
40.0000 mg | DELAYED_RELEASE_TABLET | Freq: Every day | ORAL | Status: DC
Start: 2023-06-14 — End: 2023-06-20
  Administered 2023-06-14 – 2023-06-19 (×6): 40 mg via ORAL
  Filled 2023-06-14 (×6): qty 1

## 2023-06-14 MED ORDER — FERROUS SULFATE 325 (65 FE) MG PO TABS
325.0000 mg | ORAL_TABLET | Freq: Every day | ORAL | Status: DC
Start: 2023-06-15 — End: 2023-06-20
  Administered 2023-06-15 – 2023-06-20 (×6): 325 mg via ORAL
  Filled 2023-06-14 (×6): qty 1

## 2023-06-14 MED ORDER — SODIUM CHLORIDE 0.45 % IV SOLN: INTRAVENOUS | Status: AC

## 2023-06-14 NOTE — Plan of Care (Signed)
  Problem: Education: Goal: Knowledge of General Education information will improve Description: Including pain rating scale, medication(s)/side effects and non-pharmacologic comfort measures Outcome: Progressing   Problem: Health Behavior/Discharge Planning: Goal: Ability to manage health-related needs will improve Outcome: Progressing   Problem: Clinical Measurements: Goal: Will remain free from infection Outcome: Progressing Goal: Diagnostic test results will improve Outcome: Progressing Goal: Cardiovascular complication will be avoided Outcome: Progressing   Problem: Activity: Goal: Risk for activity intolerance will decrease Outcome: Progressing   Problem: Nutrition: Goal: Adequate nutrition will be maintained Outcome: Progressing   Problem: Elimination: Goal: Will not experience complications related to bowel motility Outcome: Progressing Goal: Will not experience complications related to urinary retention Outcome: Progressing   Problem: Pain Managment: Goal: General experience of comfort will improve and/or be controlled Outcome: Progressing   Problem: Safety: Goal: Ability to remain free from injury will improve Outcome: Progressing   Problem: Skin Integrity: Goal: Risk for impaired skin integrity will decrease Outcome: Progressing

## 2023-06-14 NOTE — ED Notes (Signed)
 Patient confuse but able to redirect. Patient pulled IV out.

## 2023-06-14 NOTE — Progress Notes (Signed)
 PROGRESS NOTE Gregory Nunez Criss Alvine.  GNF:[id number] DOB: 07-21-1945 DOA: 06/13/2023 PCP: Champ Mungo, DO  Brief Narrative/Hospital Course: 78 year old male with previous history of stroke at baseline with slurred speech and some altered mental status, HTN, CKD stage IV, history hepatitis C presented with intermittent confusion, intermittent blurry vision.  He appeared pale in the ED and felt to be with baseline mental status, he still was medium brown sent for Hemoccult. He denies any fever chills chest pain nauseas vomiting or abdomen pain.he is able to tell me he had stroke and "that's what messed me up" he has speech slurring, he is moving all extremities on command In the ED, vitals afebrile BP 149/82, saturating 100%, labs with BUN 47/creatinine 3.8 baseline~ 39/2.4 WBC 2.6 hemoglobin 7.1.  Previous baseline 9 to 10 g in 2024.1 unit PRBC ordered and admission was requested for further management of acute on chronic anemia, AKI on CKD stage IV.    Subjective: Seen and examined this morning Resting comfortably moving all extremities Speech with baseline dysarthria, intermittent confusion but baseline Overnight afebrile BP 160s-190s, on room air Received 1 unit PRBC, Hemoccult test was negative.  Anemia panel showed ferritin low at 15 saturation 7 folate 7.9 and B12 323-in the iron deficiency anemia and patient has not been taking iron supplement. Labs shows creatinine further down but not yet at baseline   Assessment and Plan: Active Problems:   Seizure disorder (HCC)   Type 2 diabetes with complication (HCC)   History of CVA (cerebrovascular accident)   Hyperlipidemia associated with type 2 diabetes mellitus (HCC)   CKD (chronic kidney disease) stage 4, GFR 15-29 ml/min (HCC)   Iron deficiency anemia   Symptomatic anemia   Acute on Chronic anemia Anemia of CKD Hx of angiodysplasia Symptomatic anemia with weakness.  Hemoccult negative anemia panel  as below w/ low ferritin and  saturation doubt he is compliant with his iron supplementation at home.  W/  unit prbc hemoglobin, MONITOR  Cont iron supplementation , cont ppi Recent Labs    08/14/22 0610 08/16/22 0327 10/14/22 1016 06/13/23 1316 06/13/23 1711 06/14/23 0523  HGB 9.3* 9.2* 10.6* 7.1*  --  7.7*  MCV 89.4 90.9 93 92.8  --  90.7  VITAMINB12  --   --   --   --  323  --   FOLATE  --   --   --   --  7.9  --   FERRITIN 15*  --  43  --  15*  --   TIBC 377  --  333  --  501*  --   IRON 39*  --  96  --  35*  --   RETICCTPCT  --   --   --   --  1.3  --    HTN: BP poorly controlled. He says he has not taken his BP meds in 2 wks (states they have not been giving it)-BP fairly stable on amlodipine hydralazine  AKI on CKD stage IV w/ mild metabolic acidosis: baseline~ 39/2.4 mild aki, creatinine slowly improving continue IV fluid hydration encourage oral hydration avoid hypotension and nephrotoxic medication . Question if its progression of his CKD. Recent Labs    08/13/22 1128 08/13/22 1221 08/14/22 0610 08/15/22 0305 08/16/22 0327 10/14/22 1016 06/13/23 1316 06/14/23 0523  BUN 20 23 25* 36* 41* 39* 47* 45*  CREATININE 2.26* 2.40* 2.39* 2.63* 2.83* 2.46* 3.88* 3.41*  CO2 21*  --  22 22 20* 21 20* 21*  K 3.3* 3.4* 3.0* 3.2* 3.7 3.8 4.5 4.1    history hepatitis C: stable  Leukopenia: Monitor  History of diabetes HLD: Cbg stable. Recent Labs  Lab 06/13/23 1323  GLUCAP 87    Hx of stroke NH resident Speech difficulties and mild confusion at baseline: at baseline with slurred speech and some altered mental status-there is concern for possible mild altered mental status but currently at presentation alert awake interactive and at baseline.  UA unremarkable  Body mass index is 21.09 kg/m.   DVT prophylaxis: SCDs Start: 06/13/23 1730 Code Status:   Code Status: Full Code Family Communication: plan of care discussed with patient at bedside. Patient status is: Remains hospitalized because of  severity of illness Level of care: Telemetry   Dispo: The patient is from: Facility            Anticipated disposition: TBD PT OT has been consulted Objective: Vitals last 24 hrs: Vitals:   06/14/23 0525 06/14/23 0700 06/14/23 0900 06/14/23 0933  BP: (!) 192/98 (!) 161/99 (!) 146/100   Pulse: 95 88 72   Resp: 16  16   Temp: 97.6 F (36.4 C)   97.8 F (36.6 C)  TempSrc: Oral   Oral  SpO2: 100% 100% 100%   Weight:      Height:       Weight change:   Physical Examination: General exam: alert awake,at baseline, older than stated age HEENT:Oral mucosa moist, Ear/Nose WNL grossly Respiratory system: Bilaterally clear BS,no use of accessory muscle Cardiovascular system: S1 & S2 +, No JVD. Gastrointestinal system: Abdomen soft,NT,ND, BS+ Nervous System: Alert, awake, moving all extremities,and following commands. Extremities: LE edema neg,distal peripheral pulses palpable and warm.  Skin: No rashes,no icterus. MSK: Normal muscle bulk,tone, power   Medications reviewed:  Scheduled Meds:  amLODipine  5 mg Oral Daily   atorvastatin  40 mg Oral Daily   hydrALAZINE  25 mg Oral Q8H   pantoprazole (PROTONIX) IV  40 mg Intravenous Q24H   Continuous Infusions:  sodium chloride      Diet Order             Diet Heart Room service appropriate? Yes; Fluid consistency: Thin  Diet effective now                   Intake/Output Summary (Last 24 hours) at 06/14/2023 1001 Last data filed at 06/13/2023 2246 Gross per 24 hour  Intake 1379.83 ml  Output --  Net 1379.83 ml   Net IO Since Admission: 1,379.83 mL [06/14/23 1001]  Wt Readings from Last 3 Encounters:  06/13/23 72.5 kg  11/23/22 72.5 kg  11/03/22 71.6 kg     Unresulted Labs (From admission, onward)     Start     Ordered   06/13/23 1716  Hemoglobin A1c  Once,   R        06/13/23 1715           Data Reviewed: I have personally reviewed following labs and imaging studies CBC: Recent Labs  Lab 06/13/23 1316  06/14/23 0523  WBC 2.6* 4.2  NEUTROABS 1.4*  --   HGB 7.1* 7.7*  HCT 23.2* 24.4*  MCV 92.8 90.7  PLT 220 187   Basic Metabolic Panel:  Recent Labs  Lab 06/13/23 1316 06/14/23 0523  NA 139 140  K 4.5 4.1  CL 110 109  CO2 20* 21*  GLUCOSE 95 88  BUN 47* 45*  CREATININE 3.88* 3.41*  CALCIUM 9.2 9.2  GFR: Estimated Creatinine Clearance: 19.2 mL/min (A) (by C-G formula based on SCr of 3.41 mg/dL (H)). Liver Function Tests:  Recent Labs  Lab 06/13/23 1316  AST 25  ALT 18  ALKPHOS 55  BILITOT 0.4  PROT 8.3*  ALBUMIN 3.5   No results for input(s): "LIPASE", "AMYLASE" in the last 168 hours. No results for input(s): "AMMONIA" in the last 168 hours. Coagulation Profile: No results for input(s): "INR", "PROTIME" in the last 168 hours. No results for input(s): "PROBNP" in the last 168 hours.  No results for input(s): "HGBA1C" in the last 72 hours. Recent Labs  Lab 06/13/23 1323  GLUCAP 87   No results for input(s): "CHOL", "HDL", "LDLCALC", "TRIG", "CHOLHDL", "LDLDIRECT" in the last 72 hours. No results for input(s): "TSH", "T4TOTAL", "FREET4", "T3FREE", "THYROIDAB" in the last 72 hours. Sepsis Labs: No results for input(s): "PROCALCITON", "LATICACIDVEN" in the last 168 hours. No results found for this or any previous visit (from the past 240 hours).  Antimicrobials/Microbiology: Anti-infectives (From admission, onward)    None         Component Value Date/Time   SDES URINE, RANDOM 01/10/2019 1143   SPECREQUEST  01/10/2019 1143    NONE Performed at [facility], [address], [place], [place] [address]    CULT >=100,000 COLONIES/mL ESCHERICHIA COLI (A) 01/10/2019 1143   REPTSTATUS 01/12/2019 FINAL 01/10/2019 1143     Radiology Studies: No results found.   LOS: 0 days   Total time spent in review of labs and imaging, patient evaluation, formulation of plan, documentation and communication with family: 35 minutes  Lanae Boast, MD  [facility]  06/14/2023, 10:02 AM

## 2023-06-15 DIAGNOSIS — E118 Type 2 diabetes mellitus with unspecified complications: Secondary | ICD-10-CM | POA: Diagnosis not present

## 2023-06-15 DIAGNOSIS — N179 Acute kidney failure, unspecified: Secondary | ICD-10-CM | POA: Diagnosis not present

## 2023-06-15 DIAGNOSIS — N184 Chronic kidney disease, stage 4 (severe): Secondary | ICD-10-CM

## 2023-06-15 DIAGNOSIS — D649 Anemia, unspecified: Secondary | ICD-10-CM | POA: Diagnosis not present

## 2023-06-15 LAB — CBC
HCT: 25 % — ABNORMAL LOW (ref 39.0–52.0)
Hemoglobin: 7.8 g/dL — ABNORMAL LOW (ref 13.0–17.0)
MCH: 28.7 pg (ref 26.0–34.0)
MCHC: 31.2 g/dL (ref 30.0–36.0)
MCV: 91.9 fL (ref 80.0–100.0)
Platelets: 187 10*3/uL (ref 150–400)
RBC: 2.72 MIL/uL — ABNORMAL LOW (ref 4.22–5.81)
RDW: 12.5 % (ref 11.5–15.5)
WBC: 4.1 10*3/uL (ref 4.0–10.5)
nRBC: 0 % (ref 0.0–0.2)

## 2023-06-15 LAB — BASIC METABOLIC PANEL
Anion gap: 11 (ref 5–15)
BUN: 48 mg/dL — ABNORMAL HIGH (ref 8–23)
CO2: 18 mmol/L — ABNORMAL LOW (ref 22–32)
Calcium: 8.4 mg/dL — ABNORMAL LOW (ref 8.9–10.3)
Chloride: 104 mmol/L (ref 98–111)
Creatinine, Ser: 3.41 mg/dL — ABNORMAL HIGH (ref 0.61–1.24)
GFR, Estimated: 18 mL/min — ABNORMAL LOW (ref >60)
Glucose, Bld: 85 mg/dL (ref 70–99)
Potassium: 4 mmol/L (ref 3.5–5.1)
Sodium: 133 mmol/L — ABNORMAL LOW (ref 135–145)

## 2023-06-15 LAB — HEMOGLOBIN A1C
Hgb A1c MFr Bld: 5.6 % (ref 4.8–5.6)
Mean Plasma Glucose: 114 mg/dL

## 2023-06-15 MED ORDER — SODIUM CHLORIDE 0.45 % IV SOLN: INTRAVENOUS | Status: AC

## 2023-06-15 NOTE — Plan of Care (Signed)

## 2023-06-15 NOTE — Progress Notes (Signed)
  Progress Note   Patient: Gregory Nunez. ZOX:[id number] DOB: Jul 07, 1945 DOA: 06/13/2023     1 DOS: the patient was seen and examined on 06/15/2023   Brief hospital course: 78 year old male with anemia, AKI.   Assessment and Plan: Acute on chronic anemia, symptomatic Anemia of CKD Hx of angiodysplasia Hemoglobin stable  AKI on CKD stage IV w/ mild metabolic acidosis: Baseline creatinine ~ 2.4 no improvement today compared to yesterday, CO2 slightly low at 18. Increase IV fluids from 20 cc to 150 cc.  Strict I's/O.  BMP in AM.   Essential hypertension Stable.  Continue amlodipine.  PMH hepatitis C stable   Leukopenia, resolved   Diabetes mellitus type 2 Diet controlled.  Hx of stroke NH resident Speech difficulties and mild confusion at baseline: at baseline with slurred speech and some altered mental status-there was concern for possible mild altered mental status but currently at presentation alert awake interactive and at baseline.  UA unremarkable      Subjective:  Feels fine no complaints  Physical Exam: Vitals:   06/15/23 0500 06/15/23 0849 06/15/23 1159 06/15/23 1515  BP: (!) 176/90 (!) 175/88 (!) 159/83 (!) 142/82  Pulse:   (!) 102   Resp:      Temp:   98.7 F (37.1 C)   TempSrc:   Oral   SpO2:   98%   Weight:      Height:       Physical Exam Vitals reviewed.  Constitutional:      General: He is not in acute distress.    Appearance: He is not ill-appearing or toxic-appearing.  Cardiovascular:     Rate and Rhythm: Normal rate and regular rhythm.     Heart sounds: No murmur heard. Pulmonary:     Effort: Pulmonary effort is normal. No respiratory distress.     Breath sounds: No wheezing, rhonchi or rales.  Abdominal:     General: There is no distension.     Palpations: Abdomen is soft.  Musculoskeletal:     Right lower leg: No edema.     Left lower leg: No edema.  Neurological:     Mental Status: He is alert.  Psychiatric:         Mood and Affect: Mood normal.        Behavior: Behavior normal.     Data Reviewed: Creatinine without change, 3.41, BUN stable 48 Hgb 7.8 UOP 2300  Family Communication: none  Disposition: Status is: Inpatient Remains inpatient appropriate because:  AKI     Time spent: 35 minutes  Author: Brendia Sacks, MD 06/15/2023 6:26 PM  For on call review [email or web address].

## 2023-06-15 NOTE — Progress Notes (Signed)
   06/15/23 1500  TOC Brief Assessment  Insurance and Status Reviewed  Patient has primary care physician Yes  Home environment has been reviewed Apartment  Prior level of function: Modified independent  Prior/Current Home Services No current home services  Social Drivers of Health Review SDOH reviewed no interventions necessary  Readmission risk has been reviewed Yes  Transition of care needs transition of care needs identified, TOC will continue to follow

## 2023-06-16 ENCOUNTER — Inpatient Hospital Stay (HOSPITAL_COMMUNITY): Payer: 59

## 2023-06-16 DIAGNOSIS — E118 Type 2 diabetes mellitus with unspecified complications: Secondary | ICD-10-CM | POA: Diagnosis not present

## 2023-06-16 DIAGNOSIS — N179 Acute kidney failure, unspecified: Secondary | ICD-10-CM | POA: Diagnosis not present

## 2023-06-16 DIAGNOSIS — D649 Anemia, unspecified: Secondary | ICD-10-CM | POA: Diagnosis not present

## 2023-06-16 DIAGNOSIS — N184 Chronic kidney disease, stage 4 (severe): Secondary | ICD-10-CM | POA: Diagnosis not present

## 2023-06-16 LAB — URINALYSIS, ROUTINE W REFLEX MICROSCOPIC
Bilirubin Urine: NEGATIVE
Glucose, UA: NEGATIVE mg/dL
Ketones, ur: NEGATIVE mg/dL
Leukocytes,Ua: NEGATIVE
Nitrite: NEGATIVE
Protein, ur: 100 mg/dL — AB
Specific Gravity, Urine: 1.003 — ABNORMAL LOW (ref 1.005–1.030)
pH: 6 (ref 5.0–8.0)

## 2023-06-16 LAB — BASIC METABOLIC PANEL
Anion gap: 8 (ref 5–15)
BUN: 44 mg/dL — ABNORMAL HIGH (ref 8–23)
CO2: 19 mmol/L — ABNORMAL LOW (ref 22–32)
Calcium: 8.1 mg/dL — ABNORMAL LOW (ref 8.9–10.3)
Chloride: 106 mmol/L (ref 98–111)
Creatinine, Ser: 3.54 mg/dL — ABNORMAL HIGH (ref 0.61–1.24)
GFR, Estimated: 17 mL/min — ABNORMAL LOW (ref >60)
Glucose, Bld: 80 mg/dL (ref 70–99)
Potassium: 3.9 mmol/L (ref 3.5–5.1)
Sodium: 133 mmol/L — ABNORMAL LOW (ref 135–145)

## 2023-06-16 LAB — CREATININE, URINE, RANDOM: Creatinine, Urine: 33 mg/dL

## 2023-06-16 LAB — CBC
HCT: 22.9 % — ABNORMAL LOW (ref 39.0–52.0)
Hemoglobin: 7.4 g/dL — ABNORMAL LOW (ref 13.0–17.0)
MCH: 28.8 pg (ref 26.0–34.0)
MCHC: 32.3 g/dL (ref 30.0–36.0)
MCV: 89.1 fL (ref 80.0–100.0)
Platelets: 180 10*3/uL (ref 150–400)
RBC: 2.57 MIL/uL — ABNORMAL LOW (ref 4.22–5.81)
RDW: 12.5 % (ref 11.5–15.5)
WBC: 3.9 10*3/uL — ABNORMAL LOW (ref 4.0–10.5)
nRBC: 0 % (ref 0.0–0.2)

## 2023-06-16 LAB — SODIUM, URINE, RANDOM: Sodium, Ur: 28 mmol/L

## 2023-06-16 MED ORDER — SODIUM CHLORIDE 0.45 % IV SOLN: INTRAVENOUS | Status: DC

## 2023-06-16 NOTE — Plan of Care (Signed)
  Problem: Health Behavior/Discharge Planning: Goal: Ability to manage health-related needs will improve Outcome: Progressing   Problem: Clinical Measurements: Goal: Will remain free from infection Outcome: Progressing Goal: Diagnostic test results will improve Outcome: Progressing Goal: Cardiovascular complication will be avoided Outcome: Progressing   Problem: Activity: Goal: Risk for activity intolerance will decrease Outcome: Progressing   Problem: Nutrition: Goal: Adequate nutrition will be maintained Outcome: Progressing   Problem: Coping: Goal: Level of anxiety will decrease Outcome: Progressing   Problem: Elimination: Goal: Will not experience complications related to bowel motility Outcome: Progressing Goal: Will not experience complications related to urinary retention Outcome: Progressing   Problem: Pain Managment: Goal: General experience of comfort will improve and/or be controlled Outcome: Progressing   Problem: Safety: Goal: Ability to remain free from injury will improve Outcome: Progressing   Problem: Skin Integrity: Goal: Risk for impaired skin integrity will decrease Outcome: Progressing

## 2023-06-16 NOTE — Progress Notes (Signed)
  Progress Note   Patient: Gregory Nunez. ZOX:[id number] DOB: [birth year] DOA: 06/13/2023     2 DOS: the patient was seen and examined on 06/16/2023   Brief hospital course: 78 year old man PMH including stroke with some slurred speech and confusion at baseline, CKD, hepatitis C, presented with intermittent confusion and blurred vision.  Admitted for acute on chronic anemia, symptomatic with weakness.  Treated with blood transfusion.  AKI superimposed on CKD stage IV.  Consultants None   Procedures/Events None   Assessment and Plan: Acute on chronic anemia, symptomatic Anemia of CKD Hx of angiodysplasia Hemoglobin remains stable   AKI on CKD stage IV w/ mild metabolic acidosis Baseline creatinine ~ 2.4  Creatinine slightly worse today up to 3.54.  Urine output incompletely recorded.  Remains better than on admission 3.88. FENa 2.3% suggesting intrinsic etiology Renal ultrasound ordered, check bladder scan in the interim. Creatinine was 2.46 or so in June 2024.  May be at new baseline.    Essential hypertension Stable.  Continue amlodipine.   PMH hepatitis C stable   Leukopenia, resolved   Diabetes mellitus type 2 Diet controlled.   PMH stroke NH resident Speech difficulties and mild confusion at baseline       Subjective:  Feels fine, no complaints  Physical Exam: Vitals:   06/15/23 1515 06/15/23 2103 06/16/23 0516 06/16/23 1224  BP: (!) 142/82 (!) 157/91 (!) 155/93 127/72  Pulse:  85 94 98  Resp:  18 16 15   Temp:  98.4 F (36.9 C) 98.5 F (36.9 C) 97.7 F (36.5 C)  TempSrc:    Oral  SpO2:  100% 100% 100%  Weight:      Height:       Physical Exam Vitals reviewed.  Constitutional:      General: He is not in acute distress.    Appearance: He is not ill-appearing or toxic-appearing.  Cardiovascular:     Rate and Rhythm: Normal rate and regular rhythm.     Heart sounds: No murmur heard. Pulmonary:     Effort: Pulmonary effort is normal. No  respiratory distress.     Breath sounds: No wheezing, rhonchi or rales.  Neurological:     Mental Status: He is alert.  Psychiatric:        Mood and Affect: Mood normal.        Behavior: Behavior normal.     Data Reviewed: Sodium 133 CO2 stable at 19 BUN slightly better at 44, creatinine up to 3.4 Hemoglobin stable at 7.4 Urinalysis noted  Family Communication: none present  Disposition: Status is: Inpatient Remains inpatient appropriate because: AKI     Time spent: 20 minutes  Author: Brendia Sacks, MD 06/16/2023 6:10 PM  For on call review [email or web address].

## 2023-06-17 DIAGNOSIS — N179 Acute kidney failure, unspecified: Secondary | ICD-10-CM | POA: Diagnosis not present

## 2023-06-17 DIAGNOSIS — E118 Type 2 diabetes mellitus with unspecified complications: Secondary | ICD-10-CM | POA: Diagnosis not present

## 2023-06-17 DIAGNOSIS — N184 Chronic kidney disease, stage 4 (severe): Secondary | ICD-10-CM | POA: Diagnosis not present

## 2023-06-17 DIAGNOSIS — D649 Anemia, unspecified: Secondary | ICD-10-CM | POA: Diagnosis not present

## 2023-06-17 LAB — BASIC METABOLIC PANEL
Anion gap: 9 (ref 5–15)
BUN: 45 mg/dL — ABNORMAL HIGH (ref 8–23)
CO2: 19 mmol/L — ABNORMAL LOW (ref 22–32)
Calcium: 8.2 mg/dL — ABNORMAL LOW (ref 8.9–10.3)
Chloride: 109 mmol/L (ref 98–111)
Creatinine, Ser: 3.75 mg/dL — ABNORMAL HIGH (ref 0.61–1.24)
GFR, Estimated: 16 mL/min — ABNORMAL LOW (ref >60)
Glucose, Bld: 84 mg/dL (ref 70–99)
Potassium: 4.1 mmol/L (ref 3.5–5.1)
Sodium: 137 mmol/L (ref 135–145)

## 2023-06-17 LAB — CBC
HCT: 22.7 % — ABNORMAL LOW (ref 39.0–52.0)
Hemoglobin: 7.2 g/dL — ABNORMAL LOW (ref 13.0–17.0)
MCH: 28.7 pg (ref 26.0–34.0)
MCHC: 31.7 g/dL (ref 30.0–36.0)
MCV: 90.4 fL (ref 80.0–100.0)
Platelets: 159 10*3/uL (ref 150–400)
RBC: 2.51 MIL/uL — ABNORMAL LOW (ref 4.22–5.81)
RDW: 12.7 % (ref 11.5–15.5)
WBC: 4 10*3/uL (ref 4.0–10.5)
nRBC: 0 % (ref 0.0–0.2)

## 2023-06-17 MED ORDER — SODIUM CHLORIDE 0.9 % IV SOLN
100.0000 mg | Freq: Once | INTRAVENOUS | Status: AC
  Administered 2023-06-17: 100 mg via INTRAVENOUS
  Filled 2023-06-17: qty 5

## 2023-06-17 MED ORDER — DARBEPOETIN ALFA 40 MCG/0.4ML IJ SOSY
40.0000 ug | PREFILLED_SYRINGE | Freq: Once | INTRAMUSCULAR | Status: AC
  Administered 2023-06-17: 40 ug via SUBCUTANEOUS
  Filled 2023-06-17: qty 0.4

## 2023-06-17 MED ORDER — AMLODIPINE BESYLATE 10 MG PO TABS
10.0000 mg | ORAL_TABLET | Freq: Every day | ORAL | Status: DC
  Administered 2023-06-18 – 2023-06-20 (×3): 10 mg via ORAL
  Filled 2023-06-17 (×3): qty 1

## 2023-06-17 MED ORDER — SODIUM CHLORIDE 0.9% IV SOLUTION: Freq: Once | INTRAVENOUS | Status: DC

## 2023-06-17 NOTE — Plan of Care (Signed)

## 2023-06-17 NOTE — Progress Notes (Signed)
  Progress Note   Patient: Gregory Nunez. ZOX:[id number] DOB: 07/05/1945 DOA: 06/13/2023     3 DOS: the patient was seen and examined on 06/17/2023   Brief hospital course: 78 year old man PMH including stroke with some slurred speech and confusion at baseline, CKD, hepatitis C, presented with intermittent confusion and blurred vision.  Admitted for acute on chronic anemia, symptomatic with weakness.  Treated with blood transfusion.  AKI superimposed on CKD stage IV.  Consultants None   Procedures/Events None   Assessment and Plan: Acute on chronic anemia, symptomatic Anemia of CKD Hx of angiodysplasia Hemoglobin remains stable but below baseline of around 9.  Asymptomatic.   AKI on CKD stage IV w/ mild metabolic acidosis Creatinine was 2.46 or so in June 2024. Creatinine is fluctuated but is slightly worse today.  Not much different from admission 3.88.  Perhaps a new baseline. FENa 2.3% suggesting intrinsic etiology Renal ultrasound unremarkable. Have asked nephrology to review.   Essential hypertension Stable.  Continue amlodipine.   PMH hepatitis C Able remained stable remains e   Leukopenia, resolved   Diabetes mellitus type 2 Remains diet controlled.   PMH stroke NH resident Speech difficulties and mild confusion at baseline        Subjective:  Feels fine  No complaints  Physical Exam: Vitals:   06/16/23 0516 06/16/23 1224 06/16/23 2036 06/17/23 0408  BP: (!) 155/93 127/72 (!) 161/82 (!) 150/94  Pulse: 94 98 91 93  Resp: 16 15 17 17   Temp: 98.5 F (36.9 C) 97.7 F (36.5 C) 98.2 F (36.8 C) 98.7 F (37.1 C)  TempSrc:  Oral  Oral  SpO2: 100% 100% 100% 100%  Weight:      Height:       Physical Exam Vitals reviewed.  Constitutional:      General: He is not in acute distress.    Appearance: He is not ill-appearing or toxic-appearing.  Neurological:     Mental Status: He is alert.     Data Reviewed: Creatinine slightly worse 3.75.   BUN stable. Hemoglobin stable at 7.2  Family Communication: none  Disposition: Status is: Inpatient Remains inpatient appropriate because: AKI     Time spent: 35 minutes  Author: Brendia Sacks, MD 06/17/2023 8:58 AM  For on call review [email or web address].

## 2023-06-17 NOTE — Consult Note (Signed)
 [facility] Renal Consultation Note  Requesting MD: Brendia Sacks, MD Indication for Consultation:  AKI on CKD  Chief complaint: confusion and blurry vision  HPI:  Gregory Nunez. is a 78 y.o. male with a history of diabetes mellitus with retinopathy, stroke, hypertension, CKD stage IV, and anemia who presented to the hospital with confusion and blurry vision.  He was found to be anemic with hemoglobin of 7.1.  He was transfused 1 unit of blood.  He has chronic kidney disease with creatinine trends as below.  Most recently creatinine was 3.75 and was 3.88 on admission.  His baseline creatinine is between 2.5 and 2.8 but was last assessed in 09/2022.  Home meds include losartan 50 mg daily.  He is a poor historian.  Note that I saw him inpatient in 07/2022 and at that time he reported his nephew as the person who would help him with medical decisions.  (At that time he had presented with a headache and had been out of his medication for weeks or months.)  He was lost to follow-up.  He has no children and is not married and parents are deceased.  His nephew Casimiro Needle and his brother Jomarie Longs are the two people that he would want contacted for any issues including help with medical decisions.  He has several brothers.  His brother Jomarie Longs) and his cousin are here visiting him when I went back in to ask him about getting blood.  The patient does agree to getting another unit of blood; his brother is at bedside and agrees as well.    Creatinine, Ser  Date/Time Value Ref Range Status  06/17/2023 05:22 AM 3.75 (H) 0.61 - 1.24 mg/dL Final  16/01/9603 54:09 AM 3.54 (H) 0.61 - 1.24 mg/dL Final  81/19/1478 29:56 AM 3.41 (H) 0.61 - 1.24 mg/dL Final  21/30/8657 84:69 AM 3.41 (H) 0.61 - 1.24 mg/dL Final  62/95/2841 32:44 PM 3.88 (H) 0.61 - 1.24 mg/dL Final  04/21/7251 66:44 AM 2.46 (H) 0.76 - 1.27 mg/dL Final  03/47/4259 56:38 AM 2.83 (H) 0.61 - 1.24 mg/dL Final  75/64/3329 51:88 AM 2.63  (H) 0.61 - 1.24 mg/dL Final  41/66/0630 16:01 AM 2.39 (H) 0.61 - 1.24 mg/dL Final  09/32/3557 32:20 PM 2.40 (H) 0.61 - 1.24 mg/dL Final  25/42/7062 37:62 AM 2.26 (H) 0.61 - 1.24 mg/dL Final  83/15/1761 60:73 AM 1.95 (H) 0.76 - 1.27 mg/dL Final  71/09/2692 85:46 AM 2.51 (H) 0.76 - 1.27 mg/dL Final  27/06/5007 38:18 AM 1.34 (H) 0.61 - 1.24 mg/dL Final  29/93/7169 67:89 AM 1.65 (H) 0.61 - 1.24 mg/dL Final  38/01/1750 02:58 AM 1.59 (H) 0.61 - 1.24 mg/dL Final  52/77/8242 35:36 PM 1.55 (H) 0.61 - 1.24 mg/dL Final  14/43/1540 08:67 AM 1.55 (H) 0.61 - 1.24 mg/dL Final  61/95/0932 67:12 PM 1.68 (H) 0.61 - 1.24 mg/dL Final  45/80/9983 38:25 AM 1.60 (H) 0.61 - 1.24 mg/dL Final  05/39/7673 41:93 PM 1.52 (H) 0.61 - 1.24 mg/dL Final  79/05/4095 35:32 AM 1.39 (H) 0.61 - 1.24 mg/dL Final  99/24/2683 41:96 AM 1.59 (H) 0.61 - 1.24 mg/dL Final  22/29/7989 21:19 AM 1.91 (H) 0.61 - 1.24 mg/dL Final  41/74/0814 48:18 AM 1.64 (H) 0.61 - 1.24 mg/dL Final  56/31/4970 26:37 PM 1.68 (H) 0.61 - 1.24 mg/dL Final  85/88/5027 74:12 AM 1.53 (H) 0.61 - 1.24 mg/dL Final  87/86/7672 09:47 AM 1.73 (H) 0.61 - 1.24 mg/dL Final  09/62/8366 29:47 AM 1.51 (H) 0.61 -  1.24 mg/dL Final  56/21/3086 57:84 PM 1.51 (H) 0.61 - 1.24 mg/dL Final  69/62/9528 41:32 PM 1.20 0.61 - 1.24 mg/dL Final  44/04/270 53:66 PM 1.29 (H) 0.61 - 1.24 mg/dL Final  44/06/4740 59:56 AM 1.35 (H) 0.61 - 1.24 mg/dL Final  38/75/6433 29:51 AM 1.35 (H) 0.61 - 1.24 mg/dL Final  88/41/6606 30:16 AM 1.31 (H) 0.61 - 1.24 mg/dL Final  04/27/3233 57:32 AM 1.46 (H) 0.61 - 1.24 mg/dL Final  20/25/4270 62:37 AM 1.68 (H) 0.61 - 1.24 mg/dL Final  62/83/1517 61:60 PM 1.78 (H) 0.61 - 1.24 mg/dL Final  73/71/0626 94:85 AM 1.40 (H) 0.61 - 1.24 mg/dL Final  46/27/0350 09:38 AM 1.42 (H) 0.61 - 1.24 mg/dL Final  18/29/9371 69:67 AM 1.59 (H) 0.61 - 1.24 mg/dL Final  89/38/1017 51:02 AM 1.35 (H) 0.61 - 1.24 mg/dL Final  58/52/7782 42:35 AM 1.41 (H) 0.61 - 1.24 mg/dL Final   36/14/4315 40:08 AM 1.46 (H) 0.61 - 1.24 mg/dL Final  67/61/9509 32:67 AM 1.51 (H) 0.61 - 1.24 mg/dL Final  12/45/8099 83:38 AM 1.66 (H) 0.61 - 1.24 mg/dL Final  25/08/3974 73:41 PM 2.20 (H) 0.61 - 1.24 mg/dL Final  93/79/0240 97:35 PM 2.35 (H) 0.61 - 1.24 mg/dL Final  32/99/2426 83:41 AM 1.11 0.61 - 1.24 mg/dL Final  96/22/2979 89:21 AM 1.05 0.61 - 1.24 mg/dL Final  19/41/7408 14:48 PM 1.19 0.61 - 1.24 mg/dL Final  18/56/3149 70:26 AM 0.91 0.61 - 1.24 mg/dL Final     PMHx:   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   Patient is approximately 5-week status post bunion correction left foot   Hyperlipidemia    Hypertension    Hypertensive retinopathy    OU   Hypoglycemia 10/22/2020   Malignant neoplasm of prostate (HCC) 01/09/2014   Neuropathy    Pancreatitis    Pneumococcal vaccination administered at current visit 11/10/2020   Prostate cancer (HCC) 12/19/2013   Gleason 4+3=7, volume 31.31 cc   Rhabdomyolysis 04/12/2021   Sciatica    Shortness of breath dyspnea    with exertion     Past Surgical History:  Procedure Laterality Date   BIOPSY  04/16/2018   Procedure: BIOPSY;  Surgeon: Charna Elizabeth, MD;  Location: [facility];  Service: Endoscopy;;   BIOPSY  05/23/2020   Procedure: BIOPSY;  Surgeon: Lynann Bologna, MD;  Location: [facility];  Service: Gastroenterology;;  EGD and COLON   BIOPSY  12/09/2020   Procedure: BIOPSY;  Surgeon: Tressia Danas, MD;  Location: [facility] END[facility]COPY;  Service: Gastroenterology;;   biopsy on throat     hx of    CATARACT EXTRACTION Bilateral    Dr. Baker Pierini   COLONOSCOPY N/A 05/23/2020   Procedure: COLONOSCOPY;  Surgeon: Lynann Bologna, MD;  Location: [facility];  Service: Gastroenterology;  Laterality: N/A;   ENTEROSCOPY N/A 10/07/2020   Procedure: ENTEROSCOPY;  Surgeon: Iva Boop, MD;  Location: [facility] ENDOSCOPY;  Service: Endoscopy;  Laterality: N/A;   ESOPHAGOGASTRODUODENOSCOPY Left 04/16/2018   Procedure: ESOPHAGOGASTRODUODENOSCOPY (EGD);  Surgeon: Charna Elizabeth, MD;  Location: [facility];  Service: Endoscopy;  Laterality: Left;   ESOPHAGOGASTRODUODENOSCOPY (EGD) WITH PROPOFOL N/A 05/23/2020   Procedure: ESOPHAGOGASTRODUODENOSCOPY (EGD) WITH PROPOFOL;  Surgeon: Lynann Bologna, MD;  Location: [facility];  Service: Gastroenterology;  Laterality: N/A;   ESOPHAGOGASTRODUODENOSCOPY (EGD) WITH PROPOFOL N/A 12/09/2020   Procedure: ESOPHAGOGASTRODUODENOSCOPY (EGD) WITH PROPOFOL;  Surgeon: Tressia Danas, MD;  Location: [facility] ENDOSCOPY;  Service: Gastroenterology;  Laterality: N/A;   EYE SURGERY     FOOT SURGERY     HOT HEMOSTASIS N/A 04/16/2018   Procedure: HOT HEMOSTASIS (ARGON PLASMA COAGULATION/BICAP);  Surgeon: Charna Elizabeth, MD;  Location: [facility];  Service: Endoscopy;  Laterality: N/A;   HOT HEMOSTASIS N/A 05/23/2020   Procedure: HOT HEMOSTASIS (ARGON PLASMA COAGULATION/BICAP);  Surgeon: Lynann Bologna, MD;  Location: [facility];  Service: Gastroenterology;  Laterality: N/A;   HOT HEMOSTASIS N/A 12/09/2020   Procedure: HOT HEMOSTASIS (ARGON PLASMA COAGULATION/BICAP);  Surgeon: Tressia Danas, MD;  Location: [facility] ENDOSCOPY;  Service: Gastroenterology;  Laterality: N/A;   LYMPHADENECTOMY Bilateral 02/27/2014   Procedure: BILATERAL LYMPHADENECTOMY;  Surgeon: Sebastian Ache, MD;  Location: [facility];  Service: Urology;  Laterality: Bilateral;   POLYPECTOMY  05/23/2020   Procedure: POLYPECTOMY;   Surgeon: Lynann Bologna, MD;  Location: [facility];  Service: Gastroenterology;;   PROSTATE BIOPSY  12/2013   Gleason 4+3=7, volume 31.31 cc   ROBOT ASSISTED LAPAROSCOPIC RADICAL PROSTATECTOMY N/A 02/27/2014   Procedure: ROBOTIC ASSISTED LAPAROSCOPIC RADICAL PROSTATECTOMY WITH INDOCYANINE GREEN DYE;  Surgeon: Sebastian Ache, MD;  Location: [facility];  Service: Urology;  Laterality: N/A;    Family Hx:  Family History  Problem Relation Age of Onset   Heart disease Mother    Heart attack Father 57   Cancer Sister        breast   Colon cancer Neg Hx    Esophageal cancer Neg Hx    Rectal cancer Neg Hx    Stomach cancer Neg Hx     Social History:  reports that he has been smoking cigarettes. He has never used smokeless tobacco. He reports that he does not currently use alcohol after a past usage of about 1.0 standard drink of alcohol per week. He reports that he does not currently use drugs after having used the following drugs: Marijuana, Cocaine, and Heroin.  Allergies:  Allergies  Allergen Reactions   Penicillins Other (See Comments)    Pt does not remember reaction but states he woke up in the hospital after taking      Medications: Prior to Admission medications   Medication Sig Start Date End Date Taking? Authorizing Provider  amLODipine (NORVASC) 5 MG tablet Take 1 tablet (5 mg total) by mouth daily. 10/14/22  Yes Steffanie Rainwater, MD  atorvastatin (LIPITOR) 40 MG tablet Take 40 mg by mouth daily.   Yes [provider]  hydrALAZINE (APRESOLINE) 25 MG tablet Take 1 tablet (25 mg total) by mouth every 8 (eight) hours. 10/14/22  Yes Steffanie Rainwater, MD  losartan (COZAAR) 50 MG tablet Take 1 tablet (50 mg total) by mouth daily. 10/14/22  Yes Steffanie Rainwater, MD  Glecaprevir-Pibrentasvir 100-40 MG TABS Take 3 tablets by mouth daily. Patient not taking: Reported on 06/13/2023 11/23/22   Ginnie Smart, MD  iron polysaccharides (NIFEREX) 150 MG capsule Take 1 capsule  (150 mg total) by mouth daily. Patient not taking: Reported on 06/13/2023 08/17/22   Chauncey Mann,  DO  COMBIVENT RESPIMAT 20-100 MCG/ACT AERS respimat Inhale 1 puff into the lungs every 6 (six) hours as needed for shortness of breath. 06/12/18 08/19/19  [provider]  mometasone-formoterol (DULERA) 100-5 MCG/ACT AERO Inhale 2 puffs into the lungs daily. Patient not taking: Reported on 08/19/2019 04/17/18 08/19/19  Berton Mount I, MD  sucralfate (CARAFATE) 1 g tablet Take 1 tablet (1 g total) by mouth 4 (four) times daily. Patient not taking: Reported on 08/19/2019 04/17/18 08/19/19  Berton Mount I, MD  tiotropium (SPIRIVA HANDIHALER) 18 MCG inhalation capsule Place 1 capsule (18 mcg total) into inhaler and inhale daily. Patient not taking: Reported on 08/19/2019 04/17/18 08/19/19  Berton Mount I, MD  traZODone (DESYREL) 50 MG tablet Take 50 mg by mouth at bedtime as needed for sleep. Patient not taking: Reported on 10/06/2020  10/08/20  [provider]   I have reviewed the patient's current and reported prior to admission medications.  Labs:     Latest Ref Rng & Units 06/17/2023    5:22 AM 06/16/2023    5:46 AM 06/15/2023    5:38 AM  BMP  Glucose 70 - 99 mg/dL 84  80  85   BUN 8 - 23 mg/dL 45  44  48   Creatinine 0.61 - 1.24 mg/dL 4.09  8.11  9.14   Sodium 135 - 145 mmol/L 137  133  133   Potassium 3.5 - 5.1 mmol/L 4.1  3.9  4.0   Chloride 98 - 111 mmol/L 109  106  104   CO2 22 - 32 mmol/L 19  19  18    Calcium 8.9 - 10.3 mg/dL 8.2  8.1  8.4     Urinalysis    Component Value Date/Time   COLORURINE COLORLESS (A) 06/16/2023 1145   APPEARANCEUR CLEAR 06/16/2023 1145   LABSPEC 1.003 (L) 06/16/2023 1145   PHURINE 6.0 06/16/2023 1145   GLUCOSEU NEGATIVE 06/16/2023 1145   HGBUR SMALL (A) 06/16/2023 1145   BILIRUBINUR NEGATIVE 06/16/2023 1145   KETONESUR NEGATIVE 06/16/2023 1145   PROTEINUR 100 (A) 06/16/2023 1145   UROBILINOGEN 1.0 01/10/2019 1132   NITRITE  NEGATIVE 06/16/2023 1145   LEUKOCYTESUR NEGATIVE 06/16/2023 1145     ROS:  Pertinent items noted in HPI and remainder of comprehensive ROS otherwise negative.  Physical Exam: Vitals:   06/17/23 1421 06/17/23 1431  BP: (!) 185/97 (!) 185/97  Pulse: 94   Resp: 16   Temp: 97.9 F (36.6 C)   SpO2: 100%      General: adult male in chair in NAD  HEENT: NCAT Eyes: EOMI sclera anicteric Neck: supple trachea midline  Heart: S1S2 tachycardic   Lungs: clear to auscultation; normal work of breathing at rest on room air Abdomen: soft/nt/nd Extremities: no edema; no cyanosis or clubbing Skin: no rash on extremities exposed Psych normal mood and affect; he becomes frustrated when not able to articulate his thoughts Neuro: dysarthria; oriented to person  Assessment/Plan:  # AKI - No recent labs.  AKI or possible CKD progression in the interim between now and his last labs in June 2024.  Note prior serologic work-up in 2024 and lost to follow-up.   - Most likely he has had CKD progression  - Optimize Anemia as below  - stop 1/2 NS - Check post-void residual bladder scan and in/out cath if over 300 mL urine retained   # CKD stage IV - Baseline Cr 2.5 - 2.8 from 2024 data  # Anemia - acute on  chronic - being worked up per primary team  - Transfuse 1 unit of PRBC's - aranesp 40 mcg once  - once he is established in clinic, we are able to set up routine retacrit infusions to help manage the anemia   # HTN  - Will increase amlodipine to 10 mg daily for tomorrow's dose   # Metabolic acidosis - Stop fluids for now and if persists may need bicarb  If labs are stable tomorrow and he has a safe discharge plan then discharge as early as tomorrow is reasonable from a strictly renal standpoint.  I discussed with the patient and his brother Jomarie Longs the importance of renal follow-up   Thank you for the consult.  Please do not hesitate to contact me with any questions regarding our patient    Estanislado Emms 06/17/2023, 7:31 PM

## 2023-06-17 NOTE — Evaluation (Signed)
 Physical Therapy Evaluation Patient Details Name: Gregory Nunez. MRN: [id number] DOB: Jun 08, 1945 Today's Date: 06/17/2023  History of Present Illness  78 year old man Admitted for acute on chronic anemia. AKI superimposed on CKD. PMH including stroke with some slurred speech and confusion at baseline, CKD, hepatitis C.  Clinical Impression  Pt admitted with above diagnosis. Pt is oriented to self only, he was not able to state his birthdate, no family present. He stated he lives alone, walks with a cane, and gets assistance for groceries from his nephews. Pt ambulated to the bathroom where he was incontinent of stool while standing over the toilet to urinate. Pt was assisted with pericare, then ambulated to the recliner with a RW. Due to his confusion, he would benefit from 24* assistance at home vs at [facility], depending of level of support available at home.  Pt currently with functional limitations due to the deficits listed below (see PT Problem List). Pt will benefit from acute skilled PT to increase their independence and safety with mobility to allow discharge.           If plan is discharge home, recommend the following: A little help with walking and/or transfers;A little help with bathing/dressing/bathroom;Assistance with cooking/housework;Direct supervision/assist for financial management;Assist for transportation;Help with stairs or ramp for entrance   Can travel by private vehicle   Yes    Equipment Recommendations Rolling walker (2 wheels)  Recommendations for Other Services       Functional Status Assessment Patient has had a recent decline in their functional status and demonstrates the ability to make significant improvements in function in a reasonable and predictable amount of time.     Precautions / Restrictions Precautions Precautions: Fall Recall of Precautions/Restrictions: Impaired Restrictions Weight Bearing Restrictions Per Provider Order: No       Mobility  Bed Mobility Overal bed mobility: Needs Assistance Bed Mobility: Supine to Sit     Supine to sit: Min assist     General bed mobility comments: assist to raise trunk    Transfers Overall transfer level: Needs assistance Equipment used: Rolling walker (2 wheels) Transfers: Sit to/from Stand Sit to Stand: Mod assist           General transfer comment: assist to power up    Ambulation/Gait Ambulation/Gait assistance: Min assist Gait Distance (Feet): 12 Feet Assistive device: Rolling walker (2 wheels) Gait Pattern/deviations: Step-through pattern, Decreased stride length, Trunk flexed Gait velocity: decr     General Gait Details: min A for navigating obstacles with RW; pt walked to bathroom, while standing over the toilet he had a large BM, was then assisted to bedside commode, then assisted with pericare, tehn walked to Best boy     Tilt Bed    Modified Rankin (Stroke Patients Only)       Balance Overall balance assessment: Needs assistance Sitting-balance support: Feet supported, No upper extremity supported Sitting balance-Leahy Scale: Good     Standing balance support: Bilateral upper extremity supported, Reliant on assistive device for balance, During functional activity Standing balance-Leahy Scale: Poor                               Pertinent Vitals/Pain Pain Assessment Pain Assessment: No/denies pain Breathing: normal Negative Vocalization: none Facial Expression: smiling or inexpressive Body Language: relaxed Consolability: no need to console PAINAD Score: 0  Home Living Family/patient expects to be discharged to:: Private residence Living Arrangements: Alone Available Help at Discharge: Family;Available 24 hours/day Type of Home: Apartment Home Access: Elevator       Home Layout: One level Home Equipment: Grab bars - tub/shower;Gilmer Mor - single point Additional  Comments: pt poor historian, home info obtained from prior rehab notes 08/15/22. No family present today. Pt did state he walks with a cane and his nephews assist with getting groceries.    Prior Function Prior Level of Function : Independent/Modified Independent;History of Falls (last six months)             Mobility Comments: uses cane, reports he falls on average 1x every 2 months ADLs Comments: Does not drive, nephew drives him to get groceries/food; does not cook, warms up frozen meals or picks up meals; sits in the tub; would like a shower seat     Extremity/Trunk Assessment   Upper Extremity Assessment Upper Extremity Assessment: Defer to OT evaluation    Lower Extremity Assessment Lower Extremity Assessment: Overall WFL for tasks assessed    Cervical / Trunk Assessment Cervical / Trunk Assessment: Kyphotic  Communication   Communication Communication: Impaired Factors Affecting Communication: Difficulty expressing self (slurred speech, difficult to understand)    Cognition Arousal: Alert Behavior During Therapy: WFL for tasks assessed/performed   PT - Cognitive impairments: No family/caregiver present to determine baseline                       PT - Cognition Comments: pt not able to state his birthdate (he said "[birth year]") Following commands: Impaired Following commands impaired: Follows one step commands inconsistently     Cueing Cueing Techniques: Verbal cues, Tactile cues     General Comments      Exercises     Assessment/Plan    PT Assessment Patient needs continued PT services  PT Problem List Decreased mobility;Decreased cognition;Decreased activity tolerance;Decreased balance       PT Treatment Interventions Gait training;Balance training;Therapeutic exercise    PT Goals (Current goals can be found in the Care Plan section)  Acute Rehab PT Goals PT Goal Formulation: Patient unable to participate in goal setting Time For Goal  Achievement: 07/01/23 Potential to Achieve Goals: Good    Frequency Min 1X/week     Co-evaluation               AM-PAC PT "6 Clicks" Mobility  Outcome Measure Help needed turning from your back to your side while in a flat bed without using bedrails?: A Little Help needed moving from lying on your back to sitting on the side of a flat bed without using bedrails?: A Lot Help needed moving to and from a bed to a chair (including a wheelchair)?: A Little Help needed standing up from a chair using your arms (e.g., wheelchair or bedside chair)?: A Lot Help needed to walk in hospital room?: A Little Help needed climbing 3-5 steps with a railing? : A Lot 6 Click Score: 15    End of Session Equipment Utilized During Treatment: Gait belt Activity Tolerance: Patient tolerated treatment well Patient left: in chair;with chair alarm set;with call bell/phone within reach Nurse Communication: Mobility status PT Visit Diagnosis: Difficulty in walking, not elsewhere classified (R26.2);History of falling (Z91.81);Unsteadiness on feet (R26.81)    Time: 6045-4098 PT Time Calculation (min) (ACUTE ONLY): 24 min   Charges:   PT Evaluation $PT Eval Moderate Complexity: 1 Mod PT Treatments $Gait Training: 8-22  mins PT General Charges $$ ACUTE PT VISIT: 1 Visit         Tamala Ser PT 06/17/2023  Acute Rehabilitation Services  Office [phone]

## 2023-06-18 DIAGNOSIS — N179 Acute kidney failure, unspecified: Secondary | ICD-10-CM | POA: Diagnosis not present

## 2023-06-18 DIAGNOSIS — N184 Chronic kidney disease, stage 4 (severe): Secondary | ICD-10-CM | POA: Diagnosis not present

## 2023-06-18 DIAGNOSIS — E118 Type 2 diabetes mellitus with unspecified complications: Secondary | ICD-10-CM | POA: Diagnosis not present

## 2023-06-18 DIAGNOSIS — D649 Anemia, unspecified: Secondary | ICD-10-CM | POA: Diagnosis not present

## 2023-06-18 LAB — CBC
HCT: 22.5 % — ABNORMAL LOW (ref 39.0–52.0)
HCT: 26.8 % — ABNORMAL LOW (ref 39.0–52.0)
Hemoglobin: 7.2 g/dL — ABNORMAL LOW (ref 13.0–17.0)
Hemoglobin: 8.7 g/dL — ABNORMAL LOW (ref 13.0–17.0)
MCH: 28.9 pg (ref 26.0–34.0)
MCH: 29 pg (ref 26.0–34.0)
MCHC: 32 g/dL (ref 30.0–36.0)
MCHC: 32.5 g/dL (ref 30.0–36.0)
MCV: 89 fL (ref 80.0–100.0)
MCV: 90.7 fL (ref 80.0–100.0)
Platelets: 149 10*3/uL — ABNORMAL LOW (ref 150–400)
Platelets: 159 10*3/uL (ref 150–400)
RBC: 2.48 MIL/uL — ABNORMAL LOW (ref 4.22–5.81)
RBC: 3.01 MIL/uL — ABNORMAL LOW (ref 4.22–5.81)
RDW: 12.8 % (ref 11.5–15.5)
RDW: 13.1 % (ref 11.5–15.5)
WBC: 3.8 10*3/uL — ABNORMAL LOW (ref 4.0–10.5)
WBC: 4.1 10*3/uL (ref 4.0–10.5)
nRBC: 0 % (ref 0.0–0.2)
nRBC: 0 % (ref 0.0–0.2)

## 2023-06-18 LAB — BASIC METABOLIC PANEL
Anion gap: 12 (ref 5–15)
Anion gap: 9 (ref 5–15)
BUN: 40 mg/dL — ABNORMAL HIGH (ref 8–23)
BUN: 44 mg/dL — ABNORMAL HIGH (ref 8–23)
CO2: 17 mmol/L — ABNORMAL LOW (ref 22–32)
CO2: 20 mmol/L — ABNORMAL LOW (ref 22–32)
Calcium: 8.6 mg/dL — ABNORMAL LOW (ref 8.9–10.3)
Calcium: 8.6 mg/dL — ABNORMAL LOW (ref 8.9–10.3)
Chloride: 108 mmol/L (ref 98–111)
Chloride: 108 mmol/L (ref 98–111)
Creatinine, Ser: 3.27 mg/dL — ABNORMAL HIGH (ref 0.61–1.24)
Creatinine, Ser: 3.56 mg/dL — ABNORMAL HIGH (ref 0.61–1.24)
GFR, Estimated: 17 mL/min — ABNORMAL LOW (ref >60)
GFR, Estimated: 19 mL/min — ABNORMAL LOW (ref >60)
Glucose, Bld: 110 mg/dL — ABNORMAL HIGH (ref 70–99)
Glucose, Bld: 77 mg/dL (ref 70–99)
Potassium: 3.9 mmol/L (ref 3.5–5.1)
Potassium: 4.2 mmol/L (ref 3.5–5.1)
Sodium: 137 mmol/L (ref 135–145)
Sodium: 137 mmol/L (ref 135–145)

## 2023-06-18 LAB — PREPARE RBC (CROSSMATCH)

## 2023-06-18 MED ORDER — SODIUM BICARBONATE 650 MG PO TABS
650.0000 mg | ORAL_TABLET | Freq: Two times a day (BID) | ORAL | Status: DC
  Administered 2023-06-18 – 2023-06-20 (×5): 650 mg via ORAL
  Filled 2023-06-18 (×5): qty 1

## 2023-06-18 MED ORDER — SODIUM BICARBONATE 650 MG PO TABS: 650.0000 mg | ORAL_TABLET | Freq: Every day | ORAL | Status: DC

## 2023-06-18 NOTE — Progress Notes (Signed)
 [facility] Progress Note  Name: Gregory Nunez. MRN: [id number] DOB: 10/31/1945  Chief Complaint:  Confusion and blurry vision   Subjective:  He had 1.5 liters UOP over 2/28 as well as one unmeasured urine void.  He states that he is eating without issues - having dinner now.  He denies any difficulty urinating.    Review of systems:  Denies shortness of breath or chest pain  Denies n/v  ----------------- Background on consult:  Gregory Nunez. is a 78 y.o. male with a history of diabetes mellitus with retinopathy, stroke, hypertension, CKD stage IV, and anemia who presented to the hospital with confusion and blurry vision.  He was found to be anemic with hemoglobin of 7.1.  He was transfused 1 unit of blood.  He has chronic kidney disease with creatinine trends as below.  Most recently creatinine was 3.75 and was 3.88 on admission.  His baseline creatinine is between 2.5 and 2.8 but was last assessed in 09/2022.  Home meds include losartan 50 mg daily.  He is a poor historian.  Note that I saw him inpatient in 07/2022 and at that time he reported his nephew as the person who would help him with medical decisions.  (At that time he had presented with a headache and had been out of his medication for weeks or months.)  He was lost to follow-up.  He has no children and is not married and parents are deceased.  His nephew Gregory Nunez and his brother Gregory Nunez are the two people that he would want contacted for any issues including help with medical decisions.  He has several brothers.  His brother Gregory Nunez) and his cousin are here visiting him when I went back in to ask him about getting blood.  The patient does agree to getting another unit of blood; his brother is at bedside and agrees as well.      Intake/Output Summary (Last 24 hours) at 06/18/2023 1738 Last data filed at 06/18/2023 1700 Gross per 24 hour  Intake 1028 ml  Output 1700 ml  Net -672 ml    Vitals:   Vitals:   06/18/23 0510 06/18/23 0526 06/18/23 0541 06/18/23 1224  BP: (!) 170/80 (!) 170/80 (!) 177/90 138/72  Pulse: 94 94 94 97  Resp: 17 17 17 20   Temp: 99 F (37.2 C) 99 F (37.2 C) 98.5 F (36.9 C) 98.4 F (36.9 C)  TempSrc:  Oral Oral Oral  SpO2: 100% 100% 100% 98%  Weight:      Height:         Physical Exam:  General adult male in bed in no acute distress HEENT normocephalic atraumatic extraocular movements intact sclera anicteric Neck supple trachea midline Lungs clear to auscultation bilaterally normal work of breathing at rest on room air Heart S1S2 no rub Abdomen soft nontender nondistended Extremities no edema  Psych normal mood and affect Neuro some expressive aphasia - c/w prior exam yesterday (and one year prior) GU - no foley; purewick canister noted   Medications reviewed   Labs:     Latest Ref Rng & Units 06/18/2023    9:32 AM 06/18/2023   12:22 AM 06/17/2023    5:22 AM  BMP  Glucose 70 - 99 mg/dL 77  409  84   BUN 8 - 23 mg/dL 40  44  45   Creatinine 0.61 - 1.24 mg/dL 8.11  9.14  7.82   Sodium 135 - 145 mmol/L 137  137  137   Potassium 3.5 - 5.1 mmol/L 3.9  4.2  4.1   Chloride 98 - 111 mmol/L 108  108  109   CO2 22 - 32 mmol/L 17  20  19    Calcium 8.9 - 10.3 mg/dL 8.6  8.6  8.2      Assessment/Plan:   # AKI - No recent labs.  AKI or possible CKD progression in the interim between now and his last labs in June 2024.  Note prior serologic work-up in 2024 and lost to follow-up.     - Most likely he has had CKD progression  - Optimize Anemia as below - repeat post-void residual bladder scan and in/out cath if over 300 mL urine retained.  (He had 143 mL on one bladder scan noted - presumably post-void)  # CKD stage IV - Baseline Cr 2.5 - 2.8 from 2024 data then lost to regular outpatient follow-up   # Anemia - acute on chronic - being worked up per primary team  - s/p PRBC's on 2/28 most recently  - aranesp 40 mcg once given on 2/28 - once he  is established in clinic, we are able to set up routine retacrit infusions to help manage the anemia    # HTN  - Increased amlodipine to 10 mg daily    # Metabolic acidosis - Start sodium bicarbonate 650 mg BID   Disposition per primary team.  I have requested follow-up in clinic with me in 3-4 weeks     Gregory Emms, MD 06/18/2023 5:54 PM

## 2023-06-18 NOTE — Plan of Care (Signed)

## 2023-06-18 NOTE — Progress Notes (Signed)
  Progress Note   Patient: Gregory Nunez. ZOX:[id number] DOB: Jan 24, 1946 DOA: 06/13/2023     4 DOS: the patient was seen and examined on 06/18/2023   Brief hospital course: 78 year old man PMH including stroke with some slurred speech and confusion at baseline, CKD, hepatitis C, presented with intermittent confusion and blurred vision.  Admitted for acute on chronic anemia, symptomatic with weakness.  Treated with blood transfusion.  AKI superimposed on CKD stage IV.  Consultants None   Procedures/Events None    Assessment and Plan: Acute on chronic anemia, symptomatic Anemia of CKD Hx of angiodysplasia Hemoglobin stable, status post an additional unit PRBC with appropriate rise. Seen by nephrology, given Aranesp and will follow-up as an outpatient to manage anemia   AKI on CKD stage IV w/ mild metabolic acidosis Creatinine was 2.46 or so in June 2024. May be a new baseline, somewhat fluctuating.  Suspect progression.  Seen by nephrology with plans for outpatient follow-up.   FENa 2.3% suggesting intrinsic etiology Renal ultrasound unremarkable.   Essential hypertension Stable.  Continue amlodipine.   PMH hepatitis C  Leukopenia, resolved   Diabetes mellitus type 2 Remains diet controlled.   PMH stroke NH resident Speech difficulties and mild confusion at baseline        Subjective: feels fine, no complaints  Physical Exam: Vitals:   06/18/23 0510 06/18/23 0526 06/18/23 0541 06/18/23 1224  BP: (!) 170/80 (!) 170/80 (!) 177/90 138/72  Pulse: 94 94 94 97  Resp: 17 17 17 20   Temp: 99 F (37.2 C) 99 F (37.2 C) 98.5 F (36.9 C) 98.4 F (36.9 C)  TempSrc:  Oral Oral Oral  SpO2: 100% 100% 100% 98%  Weight:      Height:       Physical Exam Vitals reviewed.  Constitutional:      General: He is not in acute distress.    Appearance: He is not ill-appearing or toxic-appearing.  Cardiovascular:     Rate and Rhythm: Normal rate and regular rhythm.      Heart sounds: No murmur heard. Pulmonary:     Effort: Pulmonary effort is normal. No respiratory distress.     Breath sounds: No wheezing, rhonchi or rales.  Neurological:     Mental Status: He is alert.  Psychiatric:        Mood and Affect: Mood normal.        Behavior: Behavior normal.     Data Reviewed: Creatinine slightly better 3.27.  CO2 low at 17 will start bicarb. Hemoglobin appropriately increased to 8.7 status post 1 unit PRBC transfusion.  Family Communication: nephew by telephone  Disposition: Status is: Inpatient Remains inpatient appropriate because: SNF     Time spent: 20 minutes  Author: Brendia Sacks, MD 06/18/2023 4:20 PM  For on call review [email or web address].

## 2023-06-19 DIAGNOSIS — N184 Chronic kidney disease, stage 4 (severe): Secondary | ICD-10-CM | POA: Diagnosis not present

## 2023-06-19 DIAGNOSIS — E118 Type 2 diabetes mellitus with unspecified complications: Secondary | ICD-10-CM | POA: Diagnosis not present

## 2023-06-19 DIAGNOSIS — N179 Acute kidney failure, unspecified: Secondary | ICD-10-CM | POA: Diagnosis not present

## 2023-06-19 DIAGNOSIS — D649 Anemia, unspecified: Secondary | ICD-10-CM | POA: Diagnosis not present

## 2023-06-19 LAB — CBC
HCT: 28 % — ABNORMAL LOW (ref 39.0–52.0)
Hemoglobin: 8.9 g/dL — ABNORMAL LOW (ref 13.0–17.0)
MCH: 29 pg (ref 26.0–34.0)
MCHC: 31.8 g/dL (ref 30.0–36.0)
MCV: 91.2 fL (ref 80.0–100.0)
Platelets: 166 10*3/uL (ref 150–400)
RBC: 3.07 MIL/uL — ABNORMAL LOW (ref 4.22–5.81)
RDW: 13.3 % (ref 11.5–15.5)
WBC: 3.9 10*3/uL — ABNORMAL LOW (ref 4.0–10.5)
nRBC: 0 % (ref 0.0–0.2)

## 2023-06-19 LAB — BASIC METABOLIC PANEL
Anion gap: 7 (ref 5–15)
BUN: 43 mg/dL — ABNORMAL HIGH (ref 8–23)
CO2: 22 mmol/L (ref 22–32)
Calcium: 8.9 mg/dL (ref 8.9–10.3)
Chloride: 109 mmol/L (ref 98–111)
Creatinine, Ser: 3.47 mg/dL — ABNORMAL HIGH (ref 0.61–1.24)
GFR, Estimated: 18 mL/min — ABNORMAL LOW (ref >60)
Glucose, Bld: 77 mg/dL (ref 70–99)
Potassium: 4 mmol/L (ref 3.5–5.1)
Sodium: 138 mmol/L (ref 135–145)

## 2023-06-19 MED ORDER — LOSARTAN POTASSIUM 50 MG PO TABS
25.0000 mg | ORAL_TABLET | Freq: Every day | ORAL | Status: DC
  Administered 2023-06-19 – 2023-06-20 (×2): 25 mg via ORAL
  Filled 2023-06-19 (×2): qty 1

## 2023-06-19 NOTE — Plan of Care (Signed)

## 2023-06-19 NOTE — NC FL2 (Signed)
 [place] MEDICAID FL2 LEVEL OF CARE FORM     IDENTIFICATION  Patient Name: Gregory Nunez. Birthdate: 1945/11/12 Sex: male Admission Date (Current Location): 06/13/2023  [place] and [place] Number:  Producer, television/film/video and Address:  [facility]  [address], [place] [address]      Provider Number: [id number]  Attending Physician Name and Address:  Standley Brooking, MD  Relative Name and Phone Number:  Zakari Bathe (nephew) [phone]    Current Level of Care: Hospital Recommended Level of Care: Skilled Nursing Facility Prior Approval Number:    Date Approved/Denied:   PASRR Number: [id number]  Discharge Plan: SNF    Current Diagnoses: Patient Active Problem List   Diagnosis Date Noted   AKI (acute kidney injury) (HCC) 06/15/2023   Symptomatic anemia 06/13/2023   Chronic active hepatitis C (HCC) 11/03/2022   Severe hypertension 08/13/2022   Abscess of chest wall 12/04/2021   History of epistaxis 12/04/2021   Debility 04/15/2021   History of alcohol abuse 04/13/2021   Diabetic hypoglycemia (HCC) 04/12/2021   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/12/2021   Weakness of both legs    Adenomatous duodenal polyp    AVM (arteriovenous malformation) of duodenum, acquired with hemorrhage    Angiodysplasia of intestine    Iron deficiency anemia 05/22/2020   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 (HCC)    History of CVA (cerebrovascular accident) 04/23/2020   Hyperlipidemia associated with type 2 diabetes mellitus (HCC) 04/23/2020   CKD (chronic kidney disease) stage 4, GFR 15-29 ml/min (HCC) 04/23/2020   Tobacco use 04/23/2020   Seizure disorder (HCC) 11/08/2018   Hypertension associated with diabetes (HCC) 11/08/2018   Type 2 diabetes with complication (HCC) 11/08/2018   Prostate cancer (HCC) 02/27/2014    Orientation RESPIRATION BLADDER Height & Weight     Self  Normal Incontinent Weight: 72.5 kg Height:   6\' 1"  (185.4 cm)  BEHAVIORAL SYMPTOMS/MOOD NEUROLOGICAL BOWEL NUTRITION STATUS      Incontinent Diet (Heart Healthy)  AMBULATORY STATUS COMMUNICATION OF NEEDS Skin   Limited Assist Verbally Normal                       Personal Care Assistance Level of Assistance  Bathing, Feeding, Dressing Bathing Assistance: Limited assistance Feeding assistance: Independent Dressing Assistance: Limited assistance     Functional Limitations Info  Sight Sight Info: Impaired (? glasses)        SPECIAL CARE FACTORS FREQUENCY  PT (By licensed PT), OT (By licensed OT)     PT Frequency: 5x/week OT Frequency: 5x/week            Contractures Contractures Info: Not present    Additional Factors Info  Code Status, Allergies Code Status Info: Full Allergies Info: Penicillins           Current Medications (06/19/2023):  This is the current hospital active medication list Current Facility-Administered Medications  Medication Dose Route Frequency Provider Last Rate Last Admin   acetaminophen (TYLENOL) tablet 650 mg  650 mg Oral Q6H PRN Kc, Ramesh, MD       Or   acetaminophen (TYLENOL) suppository 650 mg  650 mg Rectal Q6H PRN Kc, Ramesh, MD       amLODipine (NORVASC) tablet 10 mg  10 mg Oral Daily Estanislado Emms, MD   10 mg at 06/19/23 8756   atorvastatin (LIPITOR) tablet 40 mg  40 mg Oral Daily Lanae Boast, MD  40 mg at 06/19/23 4098   ferrous sulfate tablet 325 mg  325 mg Oral Q breakfast Lanae Boast, MD   325 mg at 06/19/23 0951   hydrALAZINE (APRESOLINE) injection 5 mg  5 mg Intravenous Q6H PRN Kc, Dayna Barker, MD       hydrALAZINE (APRESOLINE) tablet 25 mg  25 mg Oral Q8H Kc, Ramesh, MD   25 mg at 06/19/23 1439   losartan (COZAAR) tablet 25 mg  25 mg Oral Daily Estanislado Emms, MD   25 mg at 06/19/23 1439   ondansetron (ZOFRAN) tablet 4 mg  4 mg Oral Q6H PRN Kc, Dayna Barker, MD       Or   ondansetron (ZOFRAN) injection 4 mg  4 mg Intravenous Q6H PRN Kc, Ramesh, MD       pantoprazole  (PROTONIX) EC tablet 40 mg  40 mg Oral QHS Hessie Knows, RPH   40 mg at 06/18/23 2126   sodium bicarbonate tablet 650 mg  650 mg Oral BID Standley Brooking, MD   650 mg at 06/19/23 1191     Discharge Medications: Please see discharge summary for a list of discharge medications.  Relevant Imaging Results:  Relevant Lab Results:   Additional Information SSN [id number]  Adrian Prows, RN

## 2023-06-19 NOTE — Progress Notes (Signed)
  Progress Note   Patient: Gregory Nunez. WUJ:[id number] DOB: 12-Oct-1945 DOA: 06/13/2023     5 DOS: the patient was seen and examined on 06/19/2023   Brief hospital course: 78 year old man PMH including stroke with some slurred speech and confusion at baseline, CKD, hepatitis C, presented with intermittent confusion and blurred vision.  Admitted for acute on chronic anemia, symptomatic with weakness.  Treated with blood transfusion.  Hemoglobin stabilized.  AKI versus progression of CKD.  At this point favor progression.  Plan for SNF.  Consultants None   Procedures/Events None    Assessment and Plan: Acute on chronic anemia, symptomatic Anemia of CKD Hx of angiodysplasia Hemoglobin stable, status post an additional unit PRBC with appropriate rise. Seen by nephrology, given Aranesp and will follow-up as an outpatient to manage anemia   AKI on CKD stage IV w/ mild metabolic acidosis Creatinine was 2.46 or so in June 2024. May be a new baseline, somewhat fluctuating.  Suspect progression.  Seen by nephrology with plans for outpatient follow-up.   FENa 2.3% suggesting intrinsic etiology Renal ultrasound unremarkable. Continue losartan at discharge.  Stressed need for follow-up to prevent further CKD progression.  Follow-up with Dr. Malen Gauze 3 to 4 weeks.   Essential hypertension Stable.  Continue amlodipine.   PMH hepatitis C   Leukopenia, resolved   Diabetes mellitus type 2 Remains diet controlled.   PMH stroke NH resident Speech difficulties and mild confusion at baseline       Subjective:  Feels fine no complaints  Physical Exam: Vitals:   06/18/23 1224 06/18/23 1932 06/19/23 0604 06/19/23 1216  BP: 138/72 (!) 152/71 (!) 164/83 (!) 147/79  Pulse: 97 88 84 87  Resp: 20 18 18 18   Temp: 98.4 F (36.9 C) 97.9 F (36.6 C) 98.1 F (36.7 C) 98.4 F (36.9 C)  TempSrc: Oral Oral Oral Oral  SpO2: 98% 100% 100% 100%  Weight:      Height:       Physical  Exam Vitals reviewed.  Constitutional:      General: He is not in acute distress.    Appearance: He is not ill-appearing or toxic-appearing.  Cardiovascular:     Rate and Rhythm: Normal rate and regular rhythm.     Heart sounds: No murmur heard. Pulmonary:     Effort: Pulmonary effort is normal. No respiratory distress.     Breath sounds: No wheezing, rhonchi or rales.  Neurological:     Mental Status: He is alert.  Psychiatric:        Mood and Affect: Mood normal.        Behavior: Behavior normal.     Data Reviewed: Urine output 1700 Creatinine stable 3.47 Hemoglobin stable 8.9  Family Communication: None  Disposition: Status is: Inpatient Remains inpatient appropriate because: Await SNF     Time spent: 20 minutes  Author: Brendia Sacks, MD 06/19/2023 3:29 PM  For on call review [email or web address].

## 2023-06-19 NOTE — Progress Notes (Signed)
 [facility] Progress Note  Name: Gregory Nunez. MRN: [id number] DOB: 1946-02-25  Chief Complaint:  Confusion and blurry vision   Subjective:  He had 1.7 liters UOP over 3/1 as well as one unmeasured urine void.  Repeat post-void residual bladder scan 59 mL. He feels alright this afternoon.  Watching tv.  He states that he lives alone and hadn't been taking his medication for "a while".     Review of systems:   Denies shortness of breath or chest pain  Denies n/v; eating ok He denies any difficulty urinating  ----------------- Background on consult:  Gregory Nunez. is a 78 y.o. male with a history of diabetes mellitus with retinopathy, stroke, hypertension, CKD stage IV, and anemia who presented to the hospital with confusion and blurry vision.  He was found to be anemic with hemoglobin of 7.1.  He was transfused 1 unit of blood.  He has chronic kidney disease with creatinine trends as below.  Most recently creatinine was 3.75 and was 3.88 on admission.  His baseline creatinine is between 2.5 and 2.8 but was last assessed in 09/2022.  Home meds include losartan 50 mg daily.  He is a poor historian.  Note that I saw him inpatient in 07/2022 and at that time he reported his nephew as the person who would help him with medical decisions.  (At that time he had presented with a headache and had been out of his medication for weeks or months.)  He was lost to follow-up.  He has no children and is not married and parents are deceased.  His nephew Casimiro Needle and his brother Jomarie Longs are the two people that he would want contacted for any issues including help with medical decisions.  He has several brothers.  His brother Jomarie Longs) and his cousin are here visiting him when I went back in to ask him about getting blood.  The patient does agree to getting another unit of blood; his brother is at bedside and agrees as well.      Intake/Output Summary (Last 24 hours) at 06/19/2023  1402 Last data filed at 06/19/2023 1200 Gross per 24 hour  Intake 760 ml  Output 800 ml  Net -40 ml    Vitals:  Vitals:   06/18/23 1224 06/18/23 1932 06/19/23 0604 06/19/23 1216  BP: 138/72 (!) 152/71 (!) 164/83 (!) 147/79  Pulse: 97 88 84 87  Resp: 20 18 18 18   Temp: 98.4 F (36.9 C) 97.9 F (36.6 C) 98.1 F (36.7 C) 98.4 F (36.9 C)  TempSrc: Oral Oral Oral Oral  SpO2: 98% 100% 100% 100%  Weight:      Height:         Physical Exam:   General adult male in bed in no acute distress HEENT normocephalic atraumatic extraocular movements intact sclera anicteric Neck supple trachea midline Lungs clear to auscultation bilaterally normal work of breathing at rest on room air Heart S1S2 no rub Abdomen soft nontender nondistended Extremities no edema  Psych normal mood and affect Neuro some expressive aphasia consistent with prior.  Oriented to person and location but not to year GU - no foley; purewick in place   Medications reviewed   Labs:     Latest Ref Rng & Units 06/19/2023    5:52 AM 06/18/2023    9:32 AM 06/18/2023   12:22 AM  BMP  Glucose 70 - 99 mg/dL 77  77  119   BUN 8 - 23 mg/dL  43  40  44   Creatinine 0.61 - 1.24 mg/dL 6.07  3.71  0.62   Sodium 135 - 145 mmol/L 138  137  137   Potassium 3.5 - 5.1 mmol/L 4.0  3.9  4.2   Chloride 98 - 111 mmol/L 109  108  108   CO2 22 - 32 mmol/L 22  17  20    Calcium 8.9 - 10.3 mg/dL 8.9  8.6  8.6      Assessment/Plan:   # AKI - No recent labs.  AKI or possible CKD progression in the interim between now and his last labs in June 2024.  Note prior serologic work-up in 2024 and lost to follow-up.     - Most likely he has had CKD progression  - Optimize Anemia as below  # CKD stage IV - Baseline Cr 2.5 - 2.8 from 2024 data then lost to regular outpatient follow-up - I have requested follow-up with me as below    # Anemia - acute on chronic - being worked up per primary team  - s/p PRBC's on 2/28 most recently  - aranesp  40 mcg once given on 2/28 - once he is established in clinic, then we are able to set up routine retacrit infusions to help manage the anemia    # HTN  - Increased amlodipine to 10 mg daily - would please continue this adjustment on discharge - start back losartan at 25 mg daily (this is reduced from prior).  We can titrated on follow-up if needed.  Worried that he will again be lost to follow-up and hopefully an ARB will help reduce the risk and rate of CKD progression    # Metabolic acidosis - on sodium bicarbonate 650 mg BID   Disposition per primary team.  I have requested follow-up in clinic with me in 3-4 weeks.  Nephrology will sign off.  Please do not hesitate to contact me with any questions regarding our patient.     Estanislado Emms, MD 06/19/2023 2:23 PM

## 2023-06-19 NOTE — TOC Progression Note (Addendum)
 Transition of Care [facility]) - Progression Note    Patient Details  Name: Gregory Nunez. MRN: [id number] Date of Birth: 16-Jun-1945  [facility] CM/SW Contact  Adrian Prows, RN Phone Number: 06/19/2023, 4:20 PM  Clinical Narrative:    PT recc SNF; pt is oriented x 1 to self; called his nephew [name]; he agrees to recc; explained SNF process, [name] verbalized understanding, and would like for bed search limited to [facility]; faxed out for bed offers; ins auth.   Expected Discharge Plan: Home/Self Care    Expected Discharge Plan and Services                                               Social Determinants of Health (SDOH) Interventions SDOH Screenings   Food Insecurity: No Food Insecurity (06/14/2023)  Housing: Low Risk  (06/14/2023)  Transportation Needs: No Transportation Needs (06/14/2023)  Utilities: Not At Risk (06/14/2023)  Alcohol Screen: Low Risk  (10/14/2022)  Depression (PHQ2-9): Low Risk  (11/03/2022)  Financial Resource Strain: Low Risk  (10/14/2022)  Physical Activity: Insufficiently Active (10/14/2022)  Social Connections: Socially Isolated (06/14/2023)  Stress: No Stress Concern Present (10/14/2022)  Tobacco Use: High Risk (06/13/2023)  Health Literacy: Adequate Health Literacy (11/03/2022)    Readmission Risk Interventions    06/15/2023    3:00 PM  Readmission Risk Prevention Plan  Post Dischage Appt Complete  Medication Screening Complete  Transportation Screening Complete

## 2023-06-20 DIAGNOSIS — E118 Type 2 diabetes mellitus with unspecified complications: Secondary | ICD-10-CM | POA: Diagnosis not present

## 2023-06-20 DIAGNOSIS — D649 Anemia, unspecified: Secondary | ICD-10-CM | POA: Diagnosis not present

## 2023-06-20 DIAGNOSIS — N179 Acute kidney failure, unspecified: Secondary | ICD-10-CM | POA: Diagnosis not present

## 2023-06-20 DIAGNOSIS — N184 Chronic kidney disease, stage 4 (severe): Secondary | ICD-10-CM | POA: Diagnosis not present

## 2023-06-20 LAB — TYPE AND SCREEN
ABO/RH(D): O POS
Antibody Screen: NEGATIVE
Unit division: 0

## 2023-06-20 LAB — BPAM RBC
Blood Product Expiration Date: 202503282359
ISSUE DATE / TIME: 202503010515
Unit Type and Rh: 5100

## 2023-06-20 MED ORDER — SODIUM BICARBONATE 650 MG PO TABS: 650.0000 mg | ORAL_TABLET | Freq: Two times a day (BID) | ORAL | Status: DC

## 2023-06-20 MED ORDER — AMLODIPINE BESYLATE 10 MG PO TABS: 10.0000 mg | ORAL_TABLET | Freq: Every day | ORAL | Status: DC

## 2023-06-20 MED ORDER — LOSARTAN POTASSIUM 25 MG PO TABS: 25.0000 mg | ORAL_TABLET | Freq: Every day | ORAL | Status: DC

## 2023-06-20 MED ORDER — FERROUS SULFATE 325 (65 FE) MG PO TABS: 325.0000 mg | ORAL_TABLET | Freq: Every day | ORAL | Status: DC

## 2023-06-20 NOTE — Discharge Summary (Signed)
 Physician Discharge Summary   Patient: Gregory Nunez. MRN: [id number] DOB: Jul 28, 1945  Admit date:     06/13/2023  Discharge date: 06/20/23  Discharge Physician: Brendia Sacks   PCP: Champ Mungo, DO   Recommendations at discharge:  Care for anemia and CKD as below  Discharge Diagnoses: Principal Problem:   Symptomatic anemia Active Problems:   Seizure disorder (HCC)   Type 2 diabetes with complication (HCC)   History of CVA (cerebrovascular accident)   Hyperlipidemia associated with type 2 diabetes mellitus (HCC)   CKD (chronic kidney disease) stage 4, GFR 15-29 ml/min (HCC)   Iron deficiency anemia   AKI (acute kidney injury) (HCC)  Resolved Problems:   * No resolved hospital problems. *  Hospital Course: 78 year old man PMH including stroke with some slurred speech and confusion at baseline, CKD, hepatitis C, presented with intermittent confusion and blurred vision.  Admitted for acute on chronic anemia, symptomatic with weakness.  Treated with blood transfusion.  Hemoglobin stabilized.  AKI versus progression of CKD.  At this point favor progression. Was seen by nephrology. Condition stabilized. Plan for SNF for short-term rehab.  Consultants None   Procedures/Events None   Acute on chronic anemia, symptomatic Anemia of CKD Hx of angiodysplasia Hemoglobin stable, status post an additional unit PRBC with appropriate rise.  Probably related to CKD. Seen by nephrology, given Aranesp and will follow-up as an outpatient to manage anemia   AKI on CKD stage IV w/ mild metabolic acidosis Creatinine was 2.46 or so in June 2024. May be a new baseline, somewhat fluctuating.  Suspect progression.  Seen by nephrology with plans for outpatient follow-up.   Renal ultrasound unremarkable. Continue losartan and sodium bicarbonate at discharge.  Follow-up to prevent further CKD progression.  Follow-up with Dr. Malen Gauze 3 to 4 weeks.   Essential hypertension Stable.  Continue  amlodipine.   PMH hepatitis C   Leukopenia, resolved   Diabetes mellitus type 2 Remains diet controlled.   PMH stroke NH resident Speech difficulties and mild confusion at baseline   Disposition: Skilled nursing facility Diet recommendation:  Cardiac diet DISCHARGE MEDICATION: Allergies as of 06/20/2023       Reactions   Penicillins Other (See Comments)   Pt does not remember reaction but states he woke up in the hospital after taking        Medication List     STOP taking these medications    Glecaprevir-Pibrentasvir 100-40 MG Tabs   iron polysaccharides 150 MG capsule Commonly known as: NIFEREX       TAKE these medications    amLODipine 10 MG tablet Commonly known as: NORVASC Take 1 tablet (10 mg total) by mouth daily. What changed:  medication strength how much to take   atorvastatin 40 MG tablet Commonly known as: LIPITOR Take 40 mg by mouth daily.   ferrous sulfate 325 (65 FE) MG tablet Take 1 tablet (325 mg total) by mouth daily with breakfast. Start taking on: June 21, 2023   hydrALAZINE 25 MG tablet Commonly known as: APRESOLINE Take 1 tablet (25 mg total) by mouth every 8 (eight) hours.   losartan 25 MG tablet Commonly known as: COZAAR Take 1 tablet (25 mg total) by mouth daily. Start taking on: June 21, 2023 What changed:  medication strength how much to take   sodium bicarbonate 650 MG tablet Take 1 tablet (650 mg total) by mouth 2 (two) times daily.        Contact information for follow-up providers  Champ Mungo, DO. Schedule an appointment as soon as possible for a visit in 4 day(s).   Specialty: Internal Medicine Contact information: [address] [place] [address] [phone]              Contact information for after-discharge care     Destination     [facility] Preferred SNF .   Service: Skilled Nursing Contact information: [address] [place]  [address] [phone]                    Discharge Exam: Ceasar Mons Weights   06/13/23 1215  Weight: 72.5 kg   Physical Exam Vitals reviewed.  Constitutional:      General: He is not in acute distress.    Appearance: He is not ill-appearing or toxic-appearing.     Comments: Walking in hallway with therapy  Neurological:     Mental Status: He is alert.  Psychiatric:        Mood and Affect: Mood normal.        Behavior: Behavior normal.      Condition at discharge: good  The results of significant diagnostics from this hospitalization (including imaging, microbiology, ancillary and laboratory) are listed below for reference.   Imaging Studies: US RENAL Result Date: 06/17/2023 CLINICAL DATA:  Acute kidney injury EXAM: RENAL / URINARY TRACT ULTRASOUND COMPLETE COMPARISON:  Renal ultrasound 08/14/2022. FINDINGS: Right Kidney: Renal measurements: 10.1 x 5.3 x 4.3 cm = volume: 119 mL. Echogenicity within normal limits. No mass or hydronephrosis visualized. Left Kidney: Renal measurements: 10.5 x 5.9 x 4.4 cm = volume: 142 mL. Echogenicity within normal limits. No mass or hydronephrosis visualized. Bladder: Appears normal for degree of bladder distention. Other: None. IMPRESSION: Normal renal ultrasound. Electronically Signed   By: Darliss Cheney M.D.   On: 06/17/2023 00:07    Microbiology: Results for orders placed or performed during the hospital encounter of 04/12/21  Resp Panel by RT-PCR (Flu A&B, Covid) Nasopharyngeal Swab     Status: None   Collection Time: 04/12/21  3:57 PM   Specimen: Nasopharyngeal Swab; Nasopharyngeal(NP) swabs in vial transport medium  Result Value Ref Range Status   SARS Coronavirus 2 by RT PCR NEGATIVE NEGATIVE Final    Comment: (NOTE) SARS-CoV-2 target nucleic acids are NOT DETECTED.  The SARS-CoV-2 RNA is generally detectable in upper respiratory specimens during the acute phase of infection. The lowest concentration of SARS-CoV-2 viral copies  this assay can detect is 138 copies/mL. A negative result does not preclude SARS-Cov-2 infection and should not be used as the sole basis for treatment or other patient management decisions. A negative result may occur with  improper specimen collection/handling, submission of specimen other than nasopharyngeal swab, presence of viral mutation(s) within the areas targeted by this assay, and inadequate number of viral copies(<138 copies/mL). A negative result must be combined with clinical observations, patient history, and epidemiological information. The expected result is Negative.  Fact Sheet for Patients:  BloggerCourse.com  Fact Sheet for Healthcare Providers:  SeriousBroker.it  This test is no t yet approved or cleared by the [place] FDA and  has been authorized for detection and/or diagnosis of SARS-CoV-2 by FDA under an Emergency Use Authorization (EUA). This EUA will remain  in effect (meaning this test can be used) for the duration of the COVID-19 declaration under Section 564(b)(1) of the Act, 21 U.S.C.section 360bbb-3(b)(1), unless the authorization is terminated  or revoked sooner.       Influenza  A by PCR NEGATIVE NEGATIVE Final   Influenza B by PCR NEGATIVE NEGATIVE Final    Comment: (NOTE) The Xpert Xpress SARS-CoV-2/FLU/RSV plus assay is intended as an aid in the diagnosis of influenza from Nasopharyngeal swab specimens and should not be used as a sole basis for treatment. Nasal washings and aspirates are unacceptable for Xpert Xpress SARS-CoV-2/FLU/RSV testing.  Fact Sheet for Patients: BloggerCourse.com  Fact Sheet for Healthcare Providers: SeriousBroker.it  This test is not yet approved or cleared by the [place] FDA and has been authorized for detection and/or diagnosis of SARS-CoV-2 by FDA under an Emergency Use Authorization (EUA). This EUA  will remain in effect (meaning this test can be used) for the duration of the COVID-19 declaration under Section 564(b)(1) of the Act, 21 U.S.C. section 360bbb-3(b)(1), unless the authorization is terminated or revoked.  Performed at [facility], [address] [place], [place] [address]   Resp Panel by RT-PCR (Flu A&B, Covid) Nasopharyngeal Swab     Status: None   Collection Time: 04/16/21 11:48 AM   Specimen: Nasopharyngeal Swab; Nasopharyngeal(NP) swabs in vial transport medium  Result Value Ref Range Status   SARS Coronavirus 2 by RT PCR NEGATIVE NEGATIVE Final    Comment: (NOTE) SARS-CoV-2 target nucleic acids are NOT DETECTED.  The SARS-CoV-2 RNA is generally detectable in upper respiratory specimens during the acute phase of infection. The lowest concentration of SARS-CoV-2 viral copies this assay can detect is 138 copies/mL. A negative result does not preclude SARS-Cov-2 infection and should not be used as the sole basis for treatment or other patient management decisions. A negative result may occur with  improper specimen collection/handling, submission of specimen other than nasopharyngeal swab, presence of viral mutation(s) within the areas targeted by this assay, and inadequate number of viral copies(<138 copies/mL). A negative result must be combined with clinical observations, patient history, and epidemiological information. The expected result is Negative.  Fact Sheet for Patients:  BloggerCourse.com  Fact Sheet for Healthcare Providers:  SeriousBroker.it  This test is no t yet approved or cleared by the [place] FDA and  has been authorized for detection and/or diagnosis of SARS-CoV-2 by FDA under an Emergency Use Authorization (EUA). This EUA will remain  in effect (meaning this test can be used) for the duration of the COVID-19 declaration under Section 564(b)(1) of the Act,  21 U.S.C.section 360bbb-3(b)(1), unless the authorization is terminated  or revoked sooner.       Influenza A by PCR NEGATIVE NEGATIVE Final   Influenza B by PCR NEGATIVE NEGATIVE Final    Comment: (NOTE) The Xpert Xpress SARS-CoV-2/FLU/RSV plus assay is intended as an aid in the diagnosis of influenza from Nasopharyngeal swab specimens and should not be used as a sole basis for treatment. Nasal washings and aspirates are unacceptable for Xpert Xpress SARS-CoV-2/FLU/RSV testing.  Fact Sheet for Patients: BloggerCourse.com  Fact Sheet for Healthcare Providers: SeriousBroker.it  This test is not yet approved or cleared by the [place] FDA and has been authorized for detection and/or diagnosis of SARS-CoV-2 by FDA under an Emergency Use Authorization (EUA). This EUA will remain in effect (meaning this test can be used) for the duration of the COVID-19 declaration under Section 564(b)(1) of the Act, 21 U.S.C. section 360bbb-3(b)(1), unless the authorization is terminated or revoked.  Performed at [facility], [address] [place], [place] [address]     Labs: CBC: Recent Labs  Lab 06/16/23 [phone] 06/17/23 0522 06/18/23 0022 06/18/23  0932 06/19/23 0552  WBC 3.9* 4.0 4.1 3.8* 3.9*  HGB 7.4* 7.2* 7.2* 8.7* 8.9*  HCT 22.9* 22.7* 22.5* 26.8* 28.0*  MCV 89.1 90.4 90.7 89.0 91.2  PLT 180 159 159 149* 166   Basic Metabolic Panel: Recent Labs  Lab 06/16/23 0546 06/17/23 0522 06/18/23 0022 06/18/23 0932 06/19/23 0552  NA 133* 137 137 137 138  K 3.9 4.1 4.2 3.9 4.0  CL 106 109 108 108 109  CO2 19* 19* 20* 17* 22  GLUCOSE 80 84 110* 77 77  BUN 44* 45* 44* 40* 43*  CREATININE 3.54* 3.75* 3.56* 3.27* 3.47*  CALCIUM 8.1* 8.2* 8.6* 8.6* 8.9   Liver Function Tests: No results for input(s): "AST", "ALT", "ALKPHOS", "BILITOT", "PROT", "ALBUMIN" in the last 168 hours. CBG: No results for input(s):  "GLUCAP" in the last 168 hours.  Discharge time spent: less than 30 minutes.  Signed: Brendia Sacks, MD [facility] 06/20/2023

## 2023-06-20 NOTE — TOC Transition Note (Signed)
 Transition of Care [facility]) - Discharge Note   Patient Details  Name: Gregory Nunez. MRN: [id number] Date of Birth: [birth year]  [facility]) CM/SW Contact:  Otelia Santee, LCSW Phone Number: 06/20/2023, 2:57 PM   Clinical Narrative:    Pt's insurance approved for SNF. Pt able to transfer to [facility] today and will be going to room 124A. RN to call report [phone]. Spoke with pt's nephew and confirmed discharge plans. DC packet placed at RN station. PTAR called at 3:00pm for transportation.    Final next level of care: Skilled Nursing Facility Barriers to Discharge: No Barriers Identified   Patient Goals and CMS Choice Patient states their goals for this hospitalization and ongoing recovery are:: For pt to transfer to rehab CMS Medicare.gov Compare Post Acute Care list provided to:: Patient Choice offered to / list presented to : Patient [place] ownership interest in [facility].provided to::  (NA)    Discharge Placement   Existing PASRR number confirmed : 06/19/23          Patient chooses bed at: [facility] Patient to be transferred to facility by: PTAR Name of family member notified: Nephew Patient and family notified of of transfer: 06/20/23  Discharge Plan and Services Additional resources added to the After Visit Summary for                  DME Arranged: N/A DME Agency: NA                  Social Drivers of Health (SDOH) Interventions SDOH Screenings   Food Insecurity: No Food Insecurity (06/14/2023)  Housing: Low Risk  (06/14/2023)  Transportation Needs: No Transportation Needs (06/14/2023)  Utilities: Not At Risk (06/14/2023)  Alcohol Screen: Low Risk  (10/14/2022)  Depression (PHQ2-9): Low Risk  (11/03/2022)  Financial Resource Strain: Low Risk  (10/14/2022)  Physical Activity: Insufficiently Active (10/14/2022)  Social Connections: Socially Isolated (06/14/2023)  Stress: No Stress Concern Present  (10/14/2022)  Tobacco Use: High Risk (06/13/2023)  Health Literacy: Adequate Health Literacy (11/03/2022)     Readmission Risk Interventions    06/20/2023    2:55 PM 06/15/2023    3:00 PM  Readmission Risk Prevention Plan  Post Dischage Appt  Complete  Medication Screening  Complete  Transportation Screening Complete Complete  PCP or Specialist Appt within 5-7 Days Complete   Home Care Screening Complete   Medication Review (RN CM) Complete

## 2023-06-20 NOTE — Progress Notes (Signed)
 Physical Therapy Treatment Patient Details Name: Gregory Nunez. MRN: [id number] DOB: 1945/08/03 Today's Date: 06/20/2023   History of Present Illness 78 year old man Admitted for acute on chronic anemia. AKI superimposed on CKD. PMH including stroke with some slurred speech and confusion at baseline, CKD, hepatitis C.    PT Comments  Pt tolerated increased activity level today, he ambulated 37' with RW with min assist to navigate obstacles and to negotiate turns, no loss of balance. Due to confusion, he would benefit from 24* assistance.     If plan is discharge home, recommend the following: A little help with walking and/or transfers;A little help with bathing/dressing/bathroom;Assistance with cooking/housework;Direct supervision/assist for financial management;Assist for transportation;Help with stairs or ramp for entrance;Direct supervision/assist for medications management   Can travel by private vehicle     Yes  Equipment Recommendations  Rolling walker (2 wheels)    Recommendations for Other Services       Precautions / Restrictions Precautions Precautions: Fall Recall of Precautions/Restrictions: Impaired Restrictions Weight Bearing Restrictions Per Provider Order: No     Mobility  Bed Mobility Overal bed mobility: Needs Assistance Bed Mobility: Supine to Sit     Supine to sit: Min assist     General bed mobility comments: assist to raise trunk    Transfers Overall transfer level: Needs assistance Equipment used: Rolling walker (2 wheels) Transfers: Sit to/from Stand Sit to Stand: Min assist           General transfer comment: assist to power up    Ambulation/Gait Ambulation/Gait assistance: Min assist Gait Distance (Feet): 80 Feet Assistive device: Rolling walker (2 wheels) Gait Pattern/deviations: Step-through pattern, Decreased stride length, Trunk flexed       General Gait Details: min A to navigate obstacles and for turns with RW, no  loss of balance   Stairs             Wheelchair Mobility     Tilt Bed    Modified Rankin (Stroke Patients Only)       Balance Overall balance assessment: Needs assistance Sitting-balance support: Feet supported, No upper extremity supported Sitting balance-Leahy Scale: Good     Standing balance support: Bilateral upper extremity supported, Reliant on assistive device for balance, During functional activity Standing balance-Leahy Scale: Fair                              Hotel manager: Impaired Factors Affecting Communication: Difficulty expressing self (slurred speech, difficult to understand)  Cognition Arousal: Alert Behavior During Therapy: WFL for tasks assessed/performed   PT - Cognitive impairments: No family/caregiver present to determine baseline                       PT - Cognition Comments: pt not able to state his birthdate (he said "[birth year]") Following commands: Impaired Following commands impaired: Follows one step commands inconsistently    Cueing Cueing Techniques: Verbal cues, Tactile cues  Exercises      General Comments        Pertinent Vitals/Pain Pain Assessment Pain Assessment: No/denies pain Breathing: normal Negative Vocalization: none Facial Expression: smiling or inexpressive Body Language: relaxed Consolability: no need to console PAINAD Score: 0    Home Living                          Prior Function  PT Goals (current goals can now be found in the care plan section) Acute Rehab PT Goals PT Goal Formulation: Patient unable to participate in goal setting Time For Goal Achievement: 07/01/23 Potential to Achieve Goals: Good Progress towards PT goals: Progressing toward goals    Frequency    Min 1X/week      PT Plan      Co-evaluation              AM-PAC PT "6 Clicks" Mobility   Outcome Measure  Help needed turning from your  back to your side while in a flat bed without using bedrails?: A Little Help needed moving from lying on your back to sitting on the side of a flat bed without using bedrails?: A Little Help needed moving to and from a bed to a chair (including a wheelchair)?: A Little Help needed standing up from a chair using your arms (e.g., wheelchair or bedside chair)?: A Little Help needed to walk in hospital room?: A Little Help needed climbing 3-5 steps with a railing? : A Lot 6 Click Score: 17    End of Session Equipment Utilized During Treatment: Gait belt Activity Tolerance: Patient tolerated treatment well Patient left: in chair;with chair alarm set;with call bell/phone within reach Nurse Communication: Mobility status PT Visit Diagnosis: Difficulty in walking, not elsewhere classified (R26.2);History of falling (Z91.81);Unsteadiness on feet (R26.81)     Time: 4098-1191 PT Time Calculation (min) (ACUTE ONLY): 12 min  Charges:    $Gait Training: 8-22 mins PT General Charges $$ ACUTE PT VISIT: 1 Visit                     Tamala Ser PT 06/20/2023  Acute Rehabilitation Services  Office [phone]

## 2023-06-20 NOTE — TOC Progression Note (Addendum)
 Transition of Care [facility]) - Progression Note    Patient Details  Name: Gregory Nunez. MRN: [id number] Date of Birth: Oct 15, 1945  Transition of Care [facility]) CM/SW Contact  Otelia Santee, LCSW Phone Number: 06/20/2023, 11:18 AM  Clinical Narrative:    Left voicemail with pt's nephew requesting return call to review bed offers for SNF.  ADDENDUM: Spoke with pt's nephew to review bed offers. Nephew accepted offer for [employer]. Insurance Berkley Harvey has been requested and is currently pending. Pt medically stable for discharge and is able to transfer to SNF once authorization approved.   Expected Discharge Plan: Home/Self Care    Expected Discharge Plan and Services                                               Social Determinants of Health (SDOH) Interventions SDOH Screenings   Food Insecurity: No Food Insecurity (06/14/2023)  Housing: Low Risk  (06/14/2023)  Transportation Needs: No Transportation Needs (06/14/2023)  Utilities: Not At Risk (06/14/2023)  Alcohol Screen: Low Risk  (10/14/2022)  Depression (PHQ2-9): Low Risk  (11/03/2022)  Financial Resource Strain: Low Risk  (10/14/2022)  Physical Activity: Insufficiently Active (10/14/2022)  Social Connections: Socially Isolated (06/14/2023)  Stress: No Stress Concern Present (10/14/2022)  Tobacco Use: High Risk (06/13/2023)  Health Literacy: Adequate Health Literacy (11/03/2022)    Readmission Risk Interventions    06/15/2023    3:00 PM  Readmission Risk Prevention Plan  Post Dischage Appt Complete  Medication Screening Complete  Transportation Screening Complete

## 2023-07-04 ENCOUNTER — Telehealth: Payer: Self-pay

## 2023-07-04 NOTE — Transitions of Care (Post Inpatient/ED Visit) (Unsigned)
   07/04/2023  Name: Gregory Nunez. MRN: [id number] DOB: 09-Jul-1945  Today's TOC FU Call Status: Today's TOC FU Call Status:: Unsuccessful Call (1st Attempt) Unsuccessful Call (1st Attempt) Date: 07/04/23  Attempted to reach the patient regarding the most recent Inpatient/ED visit.  Follow Up Plan: Additional outreach attempts will be made to reach the patient to complete the Transitions of Care (Post Inpatient/ED visit) call.   Signature Karena Addison, LPN [facility] Nurse Health Advisor Direct Dial [phone]

## 2023-07-06 ENCOUNTER — Telehealth: Payer: Self-pay | Admitting: *Deleted

## 2023-07-06 NOTE — Telephone Encounter (Signed)
 RTC to [place] from [facility].  Message left that the [facility] had returned her call.

## 2023-07-06 NOTE — Telephone Encounter (Signed)
 Copied from CRM [id number]. Topic: Clinical - Home Health Verbal Orders >> Jul 06, 2023 10:27 AM Prudencio Pair wrote: Caller/Agency: Nino Glow, Physical Therapist, with [facility] Callback Number: [phone] line) Service Requested: Physical Therapy Frequency: 1 week-one, 2 week-one, & one week-five Any new concerns about the patient? No

## 2023-07-06 NOTE — Telephone Encounter (Signed)
 RTC from [place] [facility].  Requesting home PT for patient 1 time a week for 1 week.  2 times a week for 1 week and 1 time a week for 5 weeks.  Strengthening, Risk manager. VO was approved and will forward to PCP to agree or deny.

## 2023-07-07 NOTE — Transitions of Care (Post Inpatient/ED Visit) (Signed)
   07/07/2023  Name: Gregory Nunez. MRN: [id number] DOB: 1945-05-16  Today's TOC FU Call Status: Today's TOC FU Call Status:: Unsuccessful Call (2nd Attempt) Unsuccessful Call (1st Attempt) Date: 07/04/23 Unsuccessful Call (2nd Attempt) Date: 07/07/23  Attempted to reach the patient regarding the most recent Inpatient/ED visit.  Follow Up Plan: Additional outreach attempts will be made to reach the patient to complete the Transitions of Care (Post Inpatient/ED visit) call.   Signature Karena Addison, LPN [facility] Nurse Health Advisor Direct Dial [phone]

## 2023-07-07 NOTE — Transitions of Care (Post Inpatient/ED Visit) (Signed)
   07/07/2023  Name: Gregory Nunez. MRN: [id number] DOB: [birth year]  Today's TOC FU Call Status: Today's TOC FU Call Status:: Unsuccessful Call (3rd Attempt) Unsuccessful Call (1st Attempt) Date: 07/04/23 Unsuccessful Call (2nd Attempt) Date: 07/07/23 Unsuccessful Call (3rd Attempt) Date: 07/07/23  Attempted to reach the patient regarding the most recent Inpatient/ED visit.  Follow Up Plan: No further outreach attempts will be made at this time. We have been unable to contact the patient.  Signature Karena Addison, LPN [facility] Nurse Health Advisor Direct Dial [phone]

## 2023-07-08 LAB — LAB REPORT - SCANNED: EGFR: 15

## 2023-07-11 ENCOUNTER — Telehealth: Payer: Self-pay | Admitting: *Deleted

## 2023-07-11 NOTE — Telephone Encounter (Signed)
 Copied from CRM [id number]. Topic: Clinical - Home Health Verbal Orders >> Jul 08, 2023  2:25 PM Prudencio Pair wrote: Caller/Agency: Will, Occupational Therapist with [facility] Callback Number: [phone] Service Requested: Occupational Therapy Frequency: 1 time a week for 4 weeks Any new concerns about the patient? No

## 2023-07-11 NOTE — Telephone Encounter (Signed)
 RTC to [facility] need for Verbal Orders for home OT.  1 time a week for 4 weeks to work on Print production planner, Activity, Meal Prep, Laundry and housekeeping tasks.  Approval given.  Will forward to PCP for approval or denial.

## 2023-07-14 ENCOUNTER — Encounter (HOSPITAL_COMMUNITY): Payer: Self-pay

## 2023-07-14 ENCOUNTER — Inpatient Hospital Stay (HOSPITAL_COMMUNITY)
Admission: RE | Admit: 2023-07-14 | Discharge: 2023-07-14 | Disposition: A | Discharge status: Home / Self Care | Source: Ambulatory Visit | Attending: Nephrology | Admitting: Nephrology

## 2023-07-28 ENCOUNTER — Encounter (HOSPITAL_COMMUNITY)

## 2023-08-15 ENCOUNTER — Other Ambulatory Visit: Payer: Self-pay

## 2023-08-15 ENCOUNTER — Emergency Department (HOSPITAL_COMMUNITY)
Admission: EM | Admit: 2023-08-15 | Discharge: 2023-08-15 | Disposition: A | Discharge status: Home / Self Care | Attending: Emergency Medicine | Admitting: Emergency Medicine

## 2023-08-15 ENCOUNTER — Emergency Department (HOSPITAL_COMMUNITY)

## 2023-08-15 ENCOUNTER — Encounter: Admitting: Internal Medicine

## 2023-08-15 DIAGNOSIS — R410 Disorientation, unspecified: Secondary | ICD-10-CM | POA: Insufficient documentation

## 2023-08-15 DIAGNOSIS — F17200 Nicotine dependence, unspecified, uncomplicated: Secondary | ICD-10-CM | POA: Diagnosis not present

## 2023-08-15 DIAGNOSIS — Z79899 Other long term (current) drug therapy: Secondary | ICD-10-CM | POA: Diagnosis not present

## 2023-08-15 DIAGNOSIS — I129 Hypertensive chronic kidney disease with stage 1 through stage 4 chronic kidney disease, or unspecified chronic kidney disease: Secondary | ICD-10-CM | POA: Diagnosis not present

## 2023-08-15 DIAGNOSIS — N184 Chronic kidney disease, stage 4 (severe): Secondary | ICD-10-CM | POA: Diagnosis not present

## 2023-08-15 LAB — DIFFERENTIAL
Abs Immature Granulocytes: 0.01 10*3/uL (ref 0.00–0.07)
Basophils Absolute: 0 10*3/uL (ref 0.0–0.1)
Basophils Relative: 0 %
Eosinophils Absolute: 0.1 10*3/uL (ref 0.0–0.5)
Eosinophils Relative: 3 %
Immature Granulocytes: 0 %
Lymphocytes Relative: 29 %
Lymphs Abs: 0.8 10*3/uL (ref 0.7–4.0)
Monocytes Absolute: 0.4 10*3/uL (ref 0.1–1.0)
Monocytes Relative: 14 %
Neutro Abs: 1.5 10*3/uL — ABNORMAL LOW (ref 1.7–7.7)
Neutrophils Relative %: 54 %

## 2023-08-15 LAB — COMPREHENSIVE METABOLIC PANEL WITH GFR
ALT: 19 U/L (ref 0–44)
AST: 25 U/L (ref 15–41)
Albumin: 3.4 g/dL — ABNORMAL LOW (ref 3.5–5.0)
Alkaline Phosphatase: 45 U/L (ref 38–126)
Anion gap: 10 (ref 5–15)
BUN: 47 mg/dL — ABNORMAL HIGH (ref 8–23)
CO2: 20 mmol/L — ABNORMAL LOW (ref 22–32)
Calcium: 8.8 mg/dL — ABNORMAL LOW (ref 8.9–10.3)
Chloride: 107 mmol/L (ref 98–111)
Creatinine, Ser: 3.66 mg/dL — ABNORMAL HIGH (ref 0.61–1.24)
GFR, Estimated: 17 mL/min — ABNORMAL LOW (ref >60)
Glucose, Bld: 99 mg/dL (ref 70–99)
Potassium: 4.1 mmol/L (ref 3.5–5.1)
Sodium: 137 mmol/L (ref 135–145)
Total Bilirubin: 0.4 mg/dL (ref 0.0–1.2)
Total Protein: 7.9 g/dL (ref 6.5–8.1)

## 2023-08-15 LAB — CBC
HCT: 26.3 % — ABNORMAL LOW (ref 39.0–52.0)
Hemoglobin: 8.3 g/dL — ABNORMAL LOW (ref 13.0–17.0)
MCH: 28.7 pg (ref 26.0–34.0)
MCHC: 31.6 g/dL (ref 30.0–36.0)
MCV: 91 fL (ref 80.0–100.0)
Platelets: 152 10*3/uL (ref 150–400)
RBC: 2.89 MIL/uL — ABNORMAL LOW (ref 4.22–5.81)
RDW: 14.4 % (ref 11.5–15.5)
WBC: 2.8 10*3/uL — ABNORMAL LOW (ref 4.0–10.5)
nRBC: 0 % (ref 0.0–0.2)

## 2023-08-15 LAB — PROTIME-INR
INR: 1.1 (ref 0.8–1.2)
Prothrombin Time: 14.8 s (ref 11.4–15.2)

## 2023-08-15 LAB — ETHANOL: Alcohol, Ethyl (B): <15 mg/dL (ref <15)

## 2023-08-15 LAB — APTT: aPTT: 37 s — ABNORMAL HIGH (ref 24–36)

## 2023-08-15 LAB — I-STAT CG4 LACTIC ACID, ED: Lactic Acid, Venous: 0.5 mmol/L (ref 0.5–1.9)

## 2023-08-15 NOTE — ED Provider Notes (Signed)
 [employer] EMERGENCY DEPARTMENT AT [facility] Provider Note   CSN: [id number] Arrival date & time: 08/15/23  1302     History  No chief complaint on file.   Gregory Senn Khyren Misko. is a 78 y.o. male with past medical history of hypertension, hyperlipidemia, CVA, CKD 4, tobacco use, prior alcohol use, prior recreational drug use, and active hep C infection presenting with altered mental status.  According to EMS, patient found confused in the commons of his apartment complex earlier today.  He does have history of CVA with residual deficits and word finding and appears to have some level of confusion at baseline per review of prior notes.  In speaking with the patient he states that he has had some increased confusion since this morning.  He denies other symptoms like fevers, chills, cough, nausea, vomiting.  He denies any new focal weakness.  He states he has been out of all his medications for some time has not been taking any medicines.        Home Medications Prior to Admission medications   Medication Sig Start Date End Date Taking? Authorizing Provider  amLODipine  (NORVASC ) 10 MG tablet Take 1 tablet (10 mg total) by mouth daily. 06/20/23   Lonita Roach, MD  atorvastatin  (LIPITOR) 40 MG tablet Take 40 mg by mouth daily.    [provider]  ferrous sulfate  325 (65 FE) MG tablet Take 1 tablet (325 mg total) by mouth daily with breakfast. 06/21/23   Lonita Roach, MD  hydrALAZINE  (APRESOLINE ) 25 MG tablet Take 1 tablet (25 mg total) by mouth every 8 (eight) hours. 10/14/22   Vita Grip, MD  losartan  (COZAAR ) 25 MG tablet Take 1 tablet (25 mg total) by mouth daily. 06/21/23   Lonita Roach, MD  sodium bicarbonate  650 MG tablet Take 1 tablet (650 mg total) by mouth 2 (two) times daily. 06/20/23   Lonita Roach, MD  COMBIVENT RESPIMAT 20-100 MCG/ACT AERS respimat Inhale 1 puff into the lungs every 6 (six) hours as needed for shortness of breath.  06/12/18 08/19/19  [provider]  mometasone -formoterol  (DULERA) 100-5 MCG/ACT AERO Inhale 2 puffs into the lungs daily. Patient not taking: Reported on 08/19/2019 04/17/18 08/19/19  Fonnie Iba I, MD  sucralfate  (CARAFATE ) 1 g tablet Take 1 tablet (1 g total) by mouth 4 (four) times daily. Patient not taking: Reported on 08/19/2019 04/17/18 08/19/19  Fonnie Iba I, MD  tiotropium (SPIRIVA  HANDIHALER) 18 MCG inhalation capsule Place 1 capsule (18 mcg total) into inhaler and inhale daily. Patient not taking: Reported on 08/19/2019 04/17/18 08/19/19  Fonnie Iba I, MD  traZODone  (DESYREL ) 50 MG tablet Take 50 mg by mouth at bedtime as needed for sleep. Patient not taking: Reported on 10/06/2020  10/08/20  [provider]      Allergies    Penicillins    Review of Systems   Review of Systems  Constitutional:  Negative for chills, fatigue and fever.  Respiratory:  Negative for cough and shortness of breath.   Cardiovascular:  Negative for chest pain.  Gastrointestinal:  Negative for abdominal pain, nausea and vomiting.  Psychiatric/Behavioral:  Positive for confusion.     Physical Exam Updated Vital Signs BP 115/86   Pulse 77   Temp 98.4 F (36.9 C)   Resp 16   Ht 6' (1.829 m)   SpO2 100%   BMI 21.68 kg/m  Physical Exam Constitutional:      Comments: Chronically ill-appearing  Cardiovascular:     Rate and Rhythm: Normal rate and regular rhythm.  Pulmonary:     Effort: Pulmonary effort is normal.     Breath sounds: Normal breath sounds.  Abdominal:     Palpations: There is no mass.  Neurological:     Comments: Confusion/slowing.  He is dysarthric. dysmetria/dysdiadochokinesis present.  Equal symmetric strength of the upper extremities bilaterally.  Cranial nerves intact.     ED Results / Procedures / Treatments   Labs (all labs ordered are listed, but only abnormal results are displayed) Labs Reviewed  CBC - Abnormal; Notable for the following  components:      Result Value   WBC 2.8 (*)    RBC 2.89 (*)    Hemoglobin 8.3 (*)    HCT 26.3 (*)    All other components within normal limits  COMPREHENSIVE METABOLIC PANEL WITH GFR - Abnormal; Notable for the following components:   CO2 20 (*)    BUN 47 (*)    Creatinine, Ser 3.66 (*)    Calcium  8.8 (*)    Albumin 3.4 (*)    GFR, Estimated 17 (*)    All other components within normal limits  APTT - Abnormal; Notable for the following components:   aPTT 37 (*)    All other components within normal limits  DIFFERENTIAL - Abnormal; Notable for the following components:   Neutro Abs 1.5 (*)    All other components within normal limits  ETHANOL  PROTIME-INR  URINALYSIS, ROUTINE W REFLEX MICROSCOPIC  RAPID URINE DRUG SCREEN, HOSP PERFORMED  I-STAT CG4 LACTIC ACID, ED  I-STAT CG4 LACTIC ACID, ED    EKG EKG Interpretation Date/Time:  Monday August 15 2023 13:40:51 EDT Ventricular Rate:  70 PR Interval:  160 QRS Duration:  122 QT Interval:  409 QTC Calculation: 442 R Axis:   -10  Text Interpretation: Sinus rhythm Nonspecific intraventricular conduction delay Nonspecific T abnormalities, lateral leads No significant change since last tracing Confirmed by Albertus Hughs [phone]) on 08/15/2023 2:45:13 PM  Radiology No results found.  Procedures Procedures    Medications Ordered in ED Medications - No data to display  ED Course/ Medical Decision Making/ A&P                                 Medical Decision Making Amount and/or Complexity of Data Reviewed Labs: ordered. Radiology: ordered.   This is a 78 year old gentleman with pertinent past medical history of hypertension, hyperlipidemia, prior CVA, type 2 diabetes, CKD, and hep C presenting with confusion.  I imagine this presentation is likely multifactorial.  Per chart review seems that many of his neurodeficits are longstanding, low concern for any new deficit at this time.  He is hypertensive, we will obtain CT of the  head without contrast to rule out bleed, but this is unlikely.  He does have significant risk factors for metabolic derangement such as CKD, chronic hepatitis, missed medications.  Lab work demonstrating low white blood cell count, which appears chronic for this patient.  Stable anemia with hemoglobin 8.3.  PT/INR normal. Lactic normal.   CMP showing baseline renal function, BUN 47 and stable.  No significant LFT elevations.  Overall this is a 78 year old gentleman presenting with some confusion, but otherwise does not seem to be significantly off from his baseline.  Unclear what the etiology of this may be, workup thus far has been relatively unremarkable for any new  acute problems. It is difficult to say how his cognitive status is at baseline and per chart review there has been some concern for dementia in the past.   Upon reevaluation, family at bedside.  Patient states he is feeling much better, reassured by negative lab workup.  Per family he appears to be at baseline at this time.  Subjectively he does seem more clear.  Originally had plan to obtain a CT head, however given patient's improvement, overall clinical picture, lack of trauma, lack of blood thinners suspicion for bleed would be very low at this time.  This was discussed with the patient who opted for discharge.        Final Clinical Impression(s) / ED Diagnoses Final diagnoses:  Confusion    Rx / DC Orders ED Discharge Orders     None         Sheree Dieter, MD 08/15/23 1527    Albertus Hughs, DO 08/16/23 [phone]

## 2023-08-15 NOTE — Discharge Instructions (Signed)
 Follow up with your family doc in the office.  Return for worsening or recurrent symptoms.

## 2023-08-15 NOTE — ED Triage Notes (Signed)
 According to [facility]: Pt is found in commons of 8th floor of an apartment complex.  Pt was at a hospital several months prior. Pt presented confused, unable to answer orientation questions, unable to say colors, or hold a coherent conversation. Pt has slow sluggish speech. Pt has been holding R arm up entire time. Pt able to stand up,uses walker at baseline.  Vitals: 180/90 100% spo2 Cbg 110 Capnography 30 18 RR 77 hr.

## 2023-08-15 NOTE — ED Notes (Signed)
 Pt aware we need a urine sample. Provided with a urinal, unable to urinate at this time

## 2023-08-15 NOTE — ED Notes (Signed)
 Paper work revived with pt. No questions at this time. Pt is to wait in lobby for pick up by uncle.No new onset distress at this time.

## 2023-08-15 NOTE — ED Notes (Signed)
 CCMD called and pt on monitor

## 2023-08-17 ENCOUNTER — Ambulatory Visit: Payer: Self-pay | Admitting: Internal Medicine

## 2023-08-17 ENCOUNTER — Telehealth: Payer: Self-pay | Admitting: *Deleted

## 2023-08-17 ENCOUNTER — Ambulatory Visit (INDEPENDENT_AMBULATORY_CARE_PROVIDER_SITE_OTHER): Admitting: Internal Medicine

## 2023-08-17 VITALS — BP 143/84 | HR 88 | Temp 98.2°F | Ht 72.0 in | Wt 147.4 lb

## 2023-08-17 DIAGNOSIS — E1159 Type 2 diabetes mellitus with other circulatory complications: Secondary | ICD-10-CM

## 2023-08-17 DIAGNOSIS — I152 Hypertension secondary to endocrine disorders: Secondary | ICD-10-CM

## 2023-08-17 DIAGNOSIS — R410 Disorientation, unspecified: Secondary | ICD-10-CM

## 2023-08-17 NOTE — Telephone Encounter (Signed)
 RTC to [facility] PT 1 time a week for 2 weeks.  Plans to recertify in 2 weeks.  Will call for additional as needed.  Also patient's nephew mention patient had come to the ER for low blood sugars. Has a visit today in the Clinics. Asking if doctor would consider nursing visits as well.  Verbal approval given for PT. Will forward to Dr. Rozelle Corning for approval or denial.    Copied from CRM [id number]. Topic: Clinical - Home Health Verbal Orders >> Aug 17, 2023 10:48 AM Jamas Maywood wrote: Caller/Agency: [facility] Callback Number: [phone] Service Requested: Physical Therapy Frequency: 1 week 2 Any new concerns about the patient? Yes, POA reported that he went to the ER for low blood sugar. [facility] wants to know could nursing be added as well.

## 2023-08-17 NOTE — Progress Notes (Signed)
 CC: hospital f/u  HPI:  [name] is a 78 y.o. male with past medical history as detailed below who presents for hospital follow up for confusion. Please see problem based charting for detailed assessment and plan.  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  Physical Exam:  Vitals:   08/17/23 1314 08/17/23 1338  BP: (!) 164/91 (!) 143/84  Pulse: 83 88  Temp: 98.2 F (36.8 C)   TempSrc: Oral   SpO2: 100%   Weight: 147 lb 6.4 oz (66.9 kg)   Height: 6' (1.829 m)    Constitutional:Appears stated age, in no acute distress. Cardio:Regular rate and rhythm. No murmurs, rubs, or gallops. Pulm:Clear to auscultation bilaterally. Normal work of breathing on room air. ZOX:WRUEAVWU for extremity edema. Skin:Warm and dry. Neuro:Alert and oriented to self and place. Equal strength to bilateral UE and LE, 3+-4-/5 with equal  sensation. Cranial nerves grossly intact. Dysmetria present, abnormal finger to nose test. Psych:Pleasant mood and affect.  Assessment & Plan:   See Encounters Tab for problem based charting.  Hypertension associated with diabetes (HCC) BP 164/91, on recheck 143/84. OP regimen is amlodipine  10 mg daily, hydralazine  25 mg TID, losartan  25 mg diaily which he is compliant with and receives assistance in taking his medications from his family. He says that he has not taken his BP medication today. BMP last checked 04/28 with stable renal function, consistent with CKD stage 4. He follows with [facility]. Plan:No adjustments to medication regimen today.  Confusion with non-focal neuro exam Recently evaluated in ED for concern of acute confusion. The episode lasted roughly 30 minutes. He has dysarthria at baseline, no obvious acute changes from his baseline. Due to prolonged wait for CT head, shared decision making with the family resulted in discharge home without additional imaging.  Today, he states he doesn't feel very confused but remembers that he was a few days ago. He is oriented to place and self. Neurology exam overall unremarkable, unchanged from ED visit 04/28.  He does have a history of CVA and difficult to control blood pressure. 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 Lower suspicion for seizure given  lack of report of classic post-ictal state by family in ED note. No overt metabolic disturbances at time of presentation as well. Plan:Seems at apparent baseline, and family did not voice concerns about current mental status today. Will have patient set to follow up in geriatric clinic for further assessment, goal planning. No indications for skilled nursing need but I do think he will benefit from continued home health  PT/OT/aide.  Patient discussed with Dr. Broadus Canes

## 2023-08-17 NOTE — Patient Instructions (Signed)
 It was a pleasure to care for you today!  Make sure to take your medicines every day.  If you feel more confused again please go to the hospital for further evaluation.  Please schedule with the geriatrics clinic in about 3 months.  My best, Dr. Rozelle Corning

## 2023-08-17 NOTE — Assessment & Plan Note (Addendum)
 Recently evaluated in ED for concern of acute confusion. The episode lasted roughly 30 minutes. He has dysarthria at baseline, no obvious acute changes from his baseline. Due to prolonged wait for CT head, shared decision making with the family resulted in discharge home without additional imaging.  Today, he states he doesn't feel very confused but remembers that he was a few days ago. He is oriented to place and self. Neurology exam overall unremarkable, unchanged from ED visit 04/28.  He does have a history of CVA and difficult to control blood pressure. 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 Lower suspicion for seizure given lack of report of classic post-ictal state by family in ED note. No overt metabolic disturbances at time of presentation as well. Plan:Seems at apparent baseline, and family did not voice concerns about current mental status today. Will have patient set to follow up in geriatric clinic for further assessment, goal planning. No indications for skilled nursing need but I do think he will benefit from continued home health PT/OT/aide.

## 2023-08-17 NOTE — Assessment & Plan Note (Signed)
 BP 164/91, on recheck 143/84. OP regimen is amlodipine  10 mg daily, hydralazine  25 mg TID, losartan  25 mg diaily which he is compliant with and receives assistance in taking his medications from his family. He says that he has not taken his BP medication today. BMP last checked 04/28 with stable renal function, consistent with CKD stage 4. He follows with [facility]. Plan:No adjustments to medication regimen today.

## 2023-08-24 NOTE — Progress Notes (Signed)
 Internal Medicine Clinic Attending  Case discussed with the resident at the time of the visit.  We reviewed the resident's history and exam and pertinent patient test results.  I agree with the assessment, diagnosis, and plan of care documented in the resident's note.

## 2023-08-31 ENCOUNTER — Telehealth: Payer: Self-pay | Admitting: *Deleted

## 2023-08-31 NOTE — Telephone Encounter (Signed)
 RTC to PT. Message was left that the Clinics had returned her call. Copied from CRM [id number]. Topic: General - Other >> Aug 30, 2023 12:21 PM Gregory Nunez wrote: Reason for CRM: Pt therapist said that she is going to dismiss him and will admit him under new contract Friday, She can be reached at  [phone]. Also please send new face to face documentation to [facility]

## 2023-09-06 NOTE — Telephone Encounter (Signed)
 Please call [facility] back regarding patient.

## 2023-09-09 ENCOUNTER — Telehealth: Payer: Self-pay | Admitting: *Deleted

## 2023-09-09 NOTE — Telephone Encounter (Signed)
 Copied from CRM [id number]. Topic: Clinical - Home Health Verbal Orders >> Sep 08, 2023  4:52 PM Wynona Hedger wrote: Caller/Agency: [facility] Callback Number: [phone] Service Requested: Occupational Therapy Frequency: 1 time a week for 4 weeks Any new concerns about the patient? No

## 2023-09-09 NOTE — Telephone Encounter (Signed)
 Return call to Will OT with [facility]. Requesting verbal order "OT once a week x 4 weeks for balance , safety, and medication f/u". VO given - sending to [facility] for approval or denial.

## 2023-09-13 NOTE — Telephone Encounter (Signed)
 RTC to [facility]. Sted nothing else is needed for the PT order.

## 2023-09-13 NOTE — Telephone Encounter (Signed)
 Will forward message back to [place], Charity fundraiser. There is no paperwork for the doctor to complete.

## 2023-09-21 ENCOUNTER — Emergency Department (HOSPITAL_COMMUNITY)

## 2023-09-21 ENCOUNTER — Inpatient Hospital Stay (HOSPITAL_COMMUNITY)
Admission: EM | Admit: 2023-09-21 | Discharge: 2023-09-24 | DRG: 065 | Disposition: A | Discharge status: Skilled Nursing Facility | Attending: Internal Medicine | Admitting: Internal Medicine

## 2023-09-21 ENCOUNTER — Ambulatory Visit

## 2023-09-21 ENCOUNTER — Encounter (HOSPITAL_COMMUNITY): Payer: Self-pay | Admitting: *Deleted

## 2023-09-21 ENCOUNTER — Other Ambulatory Visit: Payer: Self-pay

## 2023-09-21 ENCOUNTER — Ambulatory Visit: Payer: Self-pay

## 2023-09-21 VITALS — Ht 72.0 in | Wt 145.0 lb

## 2023-09-21 DIAGNOSIS — I69322 Dysarthria following cerebral infarction: Secondary | ICD-10-CM

## 2023-09-21 DIAGNOSIS — D709 Neutropenia, unspecified: Secondary | ICD-10-CM | POA: Insufficient documentation

## 2023-09-21 DIAGNOSIS — G9340 Encephalopathy, unspecified: Secondary | ICD-10-CM | POA: Insufficient documentation

## 2023-09-21 DIAGNOSIS — E119 Type 2 diabetes mellitus without complications: Secondary | ICD-10-CM | POA: Diagnosis present

## 2023-09-21 DIAGNOSIS — N179 Acute kidney failure, unspecified: Secondary | ICD-10-CM | POA: Diagnosis not present

## 2023-09-21 DIAGNOSIS — I639 Cerebral infarction, unspecified: Secondary | ICD-10-CM | POA: Diagnosis not present

## 2023-09-21 DIAGNOSIS — F0150 Vascular dementia without behavioral disturbance: Secondary | ICD-10-CM | POA: Diagnosis present

## 2023-09-21 DIAGNOSIS — N184 Chronic kidney disease, stage 4 (severe): Secondary | ICD-10-CM

## 2023-09-21 DIAGNOSIS — Z880 Allergy status to penicillin: Secondary | ICD-10-CM

## 2023-09-21 DIAGNOSIS — Z79899 Other long term (current) drug therapy: Secondary | ICD-10-CM

## 2023-09-21 DIAGNOSIS — I6381 Other cerebral infarction due to occlusion or stenosis of small artery: Principal | ICD-10-CM | POA: Diagnosis present

## 2023-09-21 DIAGNOSIS — E785 Hyperlipidemia, unspecified: Secondary | ICD-10-CM | POA: Diagnosis present

## 2023-09-21 DIAGNOSIS — Z8249 Family history of ischemic heart disease and other diseases of the circulatory system: Secondary | ICD-10-CM

## 2023-09-21 DIAGNOSIS — E1159 Type 2 diabetes mellitus with other circulatory complications: Secondary | ICD-10-CM | POA: Diagnosis present

## 2023-09-21 DIAGNOSIS — Z7902 Long term (current) use of antithrombotics/antiplatelets: Secondary | ICD-10-CM

## 2023-09-21 DIAGNOSIS — I152 Hypertension secondary to endocrine disorders: Secondary | ICD-10-CM | POA: Diagnosis present

## 2023-09-21 DIAGNOSIS — I69320 Aphasia following cerebral infarction: Secondary | ICD-10-CM

## 2023-09-21 DIAGNOSIS — G40909 Epilepsy, unspecified, not intractable, without status epilepticus: Secondary | ICD-10-CM

## 2023-09-21 DIAGNOSIS — J449 Chronic obstructive pulmonary disease, unspecified: Secondary | ICD-10-CM | POA: Diagnosis present

## 2023-09-21 DIAGNOSIS — Z8546 Personal history of malignant neoplasm of prostate: Secondary | ICD-10-CM

## 2023-09-21 DIAGNOSIS — E8809 Other disorders of plasma-protein metabolism, not elsewhere classified: Secondary | ICD-10-CM | POA: Diagnosis present

## 2023-09-21 DIAGNOSIS — Z0000 Encounter for general adult medical examination without abnormal findings: Secondary | ICD-10-CM

## 2023-09-21 DIAGNOSIS — R4701 Aphasia: Secondary | ICD-10-CM | POA: Insufficient documentation

## 2023-09-21 DIAGNOSIS — Z9183 Wandering in diseases classified elsewhere: Secondary | ICD-10-CM

## 2023-09-21 DIAGNOSIS — Z8659 Personal history of other mental and behavioral disorders: Secondary | ICD-10-CM

## 2023-09-21 DIAGNOSIS — E1122 Type 2 diabetes mellitus with diabetic chronic kidney disease: Secondary | ICD-10-CM | POA: Diagnosis present

## 2023-09-21 DIAGNOSIS — Z604 Social exclusion and rejection: Secondary | ICD-10-CM | POA: Diagnosis present

## 2023-09-21 DIAGNOSIS — E118 Type 2 diabetes mellitus with unspecified complications: Secondary | ICD-10-CM | POA: Diagnosis present

## 2023-09-21 DIAGNOSIS — G8324 Monoplegia of upper limb affecting left nondominant side: Secondary | ICD-10-CM | POA: Diagnosis present

## 2023-09-21 DIAGNOSIS — R29705 NIHSS score 5: Secondary | ICD-10-CM | POA: Diagnosis present

## 2023-09-21 DIAGNOSIS — E113293 Type 2 diabetes mellitus with mild nonproliferative diabetic retinopathy without macular edema, bilateral: Secondary | ICD-10-CM | POA: Diagnosis present

## 2023-09-21 DIAGNOSIS — Z7951 Long term (current) use of inhaled steroids: Secondary | ICD-10-CM

## 2023-09-21 DIAGNOSIS — R296 Repeated falls: Secondary | ICD-10-CM | POA: Diagnosis present

## 2023-09-21 DIAGNOSIS — I651 Occlusion and stenosis of basilar artery: Secondary | ICD-10-CM | POA: Diagnosis present

## 2023-09-21 DIAGNOSIS — I10 Essential (primary) hypertension: Secondary | ICD-10-CM

## 2023-09-21 DIAGNOSIS — F17210 Nicotine dependence, cigarettes, uncomplicated: Secondary | ICD-10-CM | POA: Diagnosis present

## 2023-09-21 DIAGNOSIS — Z602 Problems related to living alone: Secondary | ICD-10-CM | POA: Diagnosis present

## 2023-09-21 LAB — CBC WITH DIFFERENTIAL/PLATELET
Abs Immature Granulocytes: 0.01 10*3/uL (ref 0.00–0.07)
Basophils Absolute: 0 10*3/uL (ref 0.0–0.1)
Basophils Relative: 0 %
Eosinophils Absolute: 0.1 10*3/uL (ref 0.0–0.5)
Eosinophils Relative: 4 %
HCT: 26.5 % — ABNORMAL LOW (ref 39.0–52.0)
Hemoglobin: 8.7 g/dL — ABNORMAL LOW (ref 13.0–17.0)
Immature Granulocytes: 0 %
Lymphocytes Relative: 32 %
Lymphs Abs: 0.8 10*3/uL (ref 0.7–4.0)
MCH: 29.5 pg (ref 26.0–34.0)
MCHC: 32.8 g/dL (ref 30.0–36.0)
MCV: 89.8 fL (ref 80.0–100.0)
Monocytes Absolute: 0.3 10*3/uL (ref 0.1–1.0)
Monocytes Relative: 14 %
Neutro Abs: 1.2 10*3/uL — ABNORMAL LOW (ref 1.7–7.7)
Neutrophils Relative %: 50 %
Platelets: 127 10*3/uL — ABNORMAL LOW (ref 150–400)
RBC: 2.95 MIL/uL — ABNORMAL LOW (ref 4.22–5.81)
RDW: 14.6 % (ref 11.5–15.5)
WBC: 2.5 10*3/uL — ABNORMAL LOW (ref 4.0–10.5)
nRBC: 0 % (ref 0.0–0.2)

## 2023-09-21 LAB — COMPREHENSIVE METABOLIC PANEL WITH GFR
ALT: 19 U/L (ref 0–44)
AST: 24 U/L (ref 15–41)
Albumin: 3.4 g/dL — ABNORMAL LOW (ref 3.5–5.0)
Alkaline Phosphatase: 36 U/L — ABNORMAL LOW (ref 38–126)
Anion gap: 12 (ref 5–15)
BUN: 73 mg/dL — ABNORMAL HIGH (ref 8–23)
CO2: 19 mmol/L — ABNORMAL LOW (ref 22–32)
Calcium: 8.8 mg/dL — ABNORMAL LOW (ref 8.9–10.3)
Chloride: 108 mmol/L (ref 98–111)
Creatinine, Ser: 3.63 mg/dL — ABNORMAL HIGH (ref 0.61–1.24)
GFR, Estimated: 17 mL/min — ABNORMAL LOW (ref >60)
Glucose, Bld: 101 mg/dL — ABNORMAL HIGH (ref 70–99)
Potassium: 3.7 mmol/L (ref 3.5–5.1)
Sodium: 139 mmol/L (ref 135–145)
Total Bilirubin: 0.4 mg/dL (ref 0.0–1.2)
Total Protein: 7.5 g/dL (ref 6.5–8.1)

## 2023-09-21 NOTE — Patient Instructions (Signed)
 Mr. Liskey , Thank you for taking time out of your busy schedule to complete your Annual Wellness Visit with me. I enjoyed our conversation and look forward to speaking with you again next year. I, as well as your care team,  appreciate your ongoing commitment to your health goals. Please review the following plan we discussed and let me know if I can assist you in the future. Your Game plan/ To Do List    Referrals: If you haven't heard from the office you've been referred to, please reach out to them at the phone provided.   Follow up Visits: Next Medicare AWV with our clinical staff: 09/26/2024 at 11:50 a.m. Phone visit with Nurse Health Advisor   Have you seen your provider in the last 6 months (3 months if uncontrolled diabetes)? Yes Next Office Visit with your provider: 09/22/2023 at 8:45 a.m. office visit  Clinician Recommendations:  Aim for 30 minutes of exercise or brisk walking, 6-8 glasses of water, and 5 servings of fruits and vegetables each day.       This is a list of the screening recommended for you and due dates:  Health Maintenance  Topic Date Due   Yearly kidney health urinalysis for diabetes  Never done   Zoster (Shingles) Vaccine (1 of 2) Never done   COVID-19 Vaccine (3 - Pfizer risk series) 08/14/2019   Colon Cancer Screening  05/23/2021   Eye exam for diabetics  12/05/2021   Complete foot exam   10/14/2023   Flu Shot  11/18/2023   Hemoglobin A1C  12/11/2023   Yearly kidney function blood test for diabetes  08/14/2024   Medicare Annual Wellness Visit  09/20/2024   DTaP/Tdap/Td vaccine (2 - Td or Tdap) 08/18/2029   Pneumonia Vaccine  Completed   Hepatitis C Screening  Completed   HPV Vaccine  Aged Out   Meningitis B Vaccine  Aged Out    Advanced directives: (Declined) Advance directive discussed with you today. Even though you declined this today, please call our office should you change your mind, and we can give you the proper paperwork for you to fill  out. Advance Care Planning is important because it:  [x]  Makes sure you receive the medical care that is consistent with your values, goals, and preferences  [x]  It provides guidance to your family and loved ones and reduces their decisional burden about whether or not they are making the right decisions based on your wishes.  Follow the link provided in your after visit summary or read over the paperwork we have mailed to you to help you started getting your Advance Directives in place. If you need assistance in completing these, please reach out to us  so that we can help you!  See attachments for Preventive Care and Fall Prevention Tips.

## 2023-09-21 NOTE — ED Triage Notes (Signed)
 The pt has been confused and wandering off from his residence for the past 3-4 weeks the nephew brought him in today  the pt knows his name and that he is at [place]   and he knows trump is the president but he does not know the day of the week the month or the year  no pain

## 2023-09-21 NOTE — ED Provider Triage Note (Signed)
 Emergency Medicine Provider Triage Evaluation Note  Gregory Ellithorpe Alease Hunter. , a 78 y.o. male  was evaluated in triage.  Patient is here with his nephew.  Nephew reports that patient has been wandering away from home and he has been unable to find his way back.  Patient had a stroke 3 years ago.  Nephew reports that patient has had recent increased confusion and increased difficulty caring for himself.  Review of Systems  Positive: confusion Negative: fever  Physical Exam  Ht 6' (1.829 m)   Wt 65.8 kg   BMI 19.67 kg/m  Gen:   Awake, no distress   Resp:  Normal effort  MSK:   Moves extremities without difficulty  Other:    Medical Decision Making  Medically screening exam initiated at 7:02 PM.  Appropriate orders placed.  Gregory Leaven Alease Hunter. was informed that the remainder of the evaluation will be completed by another provider, this initial triage assessment does not replace that evaluation, and the importance of remaining in the ED until their evaluation is complete.     Sandi Crosby, PA-C 09/21/23 0981

## 2023-09-21 NOTE — Progress Notes (Signed)
 Because this visit was a virtual/telehealth visit,  certain criteria was not obtained, such a blood pressure, CBG if applicable, and timed get up and go. Any medications not marked as "taking" were not mentioned during the medication reconciliation part of the visit. Any vitals not documented were not able to be obtained due to this being a telehealth visit or patient was unable to self-report a recent blood pressure reading due to a lack of equipment at home via telehealth. Vitals that have been documented are verbally provided by the patient.   Subjective:   Gregory Chillemi. is a 78 y.o. who presents for a Medicare Wellness preventive visit.  As a reminder, Annual Wellness Visits don't include a physical exam, and some assessments may be limited, especially if this visit is performed virtually. We may recommend an in-person follow-up visit with your provider if needed.  Visit Complete: Virtual I connected with  Gregory Hof. on 09/21/23 by a audio enabled telemedicine application and verified that I am speaking with the correct person using two identifiers.  Patient Location: Home  Provider Location: Office/Clinic  I discussed the limitations of evaluation and management by telemedicine. The patient expressed understanding and agreed to proceed.  Vital Signs: Because this visit was a virtual/telehealth visit, some criteria may be missing or patient reported. Any vitals not documented were not able to be obtained and vitals that have been documented are patient reported.  VideoDeclined- This patient declined Librarian, academic. Therefore the visit was completed with audio only.  Persons Participating in Visit: Patient assisted by Gregory Nunez.  AWV Questionnaire: No: Patient Medicare AWV questionnaire was not completed prior to this visit.  Cardiac Risk Factors include: advanced age (>69men, >5 women);diabetes mellitus;dyslipidemia;family  history of premature cardiovascular disease;hypertension;male gender;sedentary lifestyle     Objective:     Today's Vitals   09/21/23 1113  Weight: 145 lb (65.8 kg)  Height: 6' (1.829 m)  PainSc: 0-No pain   Body mass index is 19.67 kg/m.     09/21/2023   11:21 AM 08/15/2023    1:10 PM 06/14/2023   12:49 PM 11/23/2022    3:43 PM 11/03/2022    3:44 PM 10/14/2022    9:12 AM 10/14/2022    9:11 AM  Advanced Directives  Does Patient Have a Medical Advance Directive? Yes No No No No No No  Type of Estate agent of [place];Living will        Copy of Healthcare Power of Attorney in Chart? No - copy requested        Would patient like information on creating a medical advance directive?   No - Patient declined No - Patient declined No - Patient declined Yes (ED - Information included in AVS) Yes (ED - Information included in AVS)    Current Medications (verified) Outpatient Encounter Medications as of 09/21/2023  Medication Sig   amLODipine  (NORVASC ) 10 MG tablet Take 1 tablet (10 mg total) by mouth daily.   atorvastatin  (LIPITOR) 40 MG tablet Take 40 mg by mouth daily.   ferrous sulfate  325 (65 FE) MG tablet Take 1 tablet (325 mg total) by mouth daily with breakfast.   hydrALAZINE  (APRESOLINE ) 25 MG tablet Take 1 tablet (25 mg total) by mouth every 8 (eight) hours.   losartan  (COZAAR ) 25 MG tablet Take 1 tablet (25 mg total) by mouth daily.   sodium bicarbonate  650 MG tablet Take 1 tablet (650 mg total) by mouth 2 (two)  times daily.   [DISCONTINUED] COMBIVENT RESPIMAT 20-100 MCG/ACT AERS respimat Inhale 1 puff into the lungs every 6 (six) hours as needed for shortness of breath.   [DISCONTINUED] mometasone -formoterol  (DULERA) 100-5 MCG/ACT AERO Inhale 2 puffs into the lungs daily. (Patient not taking: Reported on 08/19/2019)   [DISCONTINUED] sucralfate  (CARAFATE ) 1 g tablet Take 1 tablet (1 g total) by mouth 4 (four) times daily. (Patient not taking: Reported on 08/19/2019)    [DISCONTINUED] tiotropium (SPIRIVA  HANDIHALER) 18 MCG inhalation capsule Place 1 capsule (18 mcg total) into inhaler and inhale daily. (Patient not taking: Reported on 08/19/2019)   [DISCONTINUED] traZODone  (DESYREL ) 50 MG tablet Take 50 mg by mouth at bedtime as needed for sleep. (Patient not taking: Reported on 10/06/2020)   No facility-administered encounter medications on file as of 09/21/2023.    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   Patient is approximately 5-week status post bunion correction left foot   Hyperlipidemia    Hypertension    Hypertensive retinopathy    OU   Hypoglycemia 10/22/2020   Malignant neoplasm of prostate (HCC) 01/09/2014   Neuropathy    Pancreatitis    Pneumococcal vaccination administered at current visit 11/10/2020   Prostate cancer (HCC) 12/19/2013   Gleason 4+3=7, volume 31.31 cc   Rhabdomyolysis 04/12/2021   Sciatica    Shortness of breath dyspnea    with exertion    Past Surgical History:  Procedure Laterality Date   BIOPSY  04/16/2018   Procedure: BIOPSY;  Surgeon: Tami Falcon, MD;  Location: [facility] ENDOSCOPY;  Service: Endoscopy;;   BIOPSY  05/23/2020   Procedure: BIOPSY;  Surgeon: Lajuan Pila, MD;  Location: [facility];  Service: Gastroenterology;;  EGD and COLON   BIOPSY  12/09/2020   Procedure: BIOPSY;  Surgeon: Lindle Rhea, MD;  Location: [facility] ENDOSCOPY;  Service:  Gastroenterology;;   biopsy on throat     hx of    CATARACT EXTRACTION Bilateral    Dr. Donovan Gallant   COLONOSCOPY N/A 05/23/2020   Procedure: COLONOSCOPY;  Surgeon: Lajuan Pila, MD;  Location: [facility];  Service: Gastroenterology;  Laterality: N/A;   ENTEROSCOPY N/A 10/07/2020   Procedure: ENTEROSCOPY;  Surgeon: Kenney Peacemaker, MD;  Location: [facility] ENDOSCOPY;  Service: Endoscopy;  Laterality: N/A;   ESOPHAGOGASTRODUODENOSCOPY Left 04/16/2018   Procedure: ESOPHAGOGASTRODUODENOSCOPY (EGD);  Surgeon: Tami Falcon, MD;  Location: [facility];  Service: Endoscopy;  Laterality: Left;   ESOPHAGOGASTRODUODENOSCOPY (EGD) WITH PROPOFOL  N/A 05/23/2020   Procedure: ESOPHAGOGASTRODUODENOSCOPY (EGD) WITH PROPOFOL ;  Surgeon: Lajuan Pila, MD;  Location: [facility];  Service: Gastroenterology;  Laterality: N/A;   ESOPHAGOGASTRODUODENOSCOPY (EGD) WITH PROPOFOL  N/A 12/09/2020   Procedure: ESOPHAGOGASTRODUODENOSCOPY (EGD) WITH PROPOFOL ;  Surgeon: Lindle Rhea, MD;  Location: [facility] ENDOSCOPY;  Service: Gastroenterology;  Laterality: N/A;   EYE SURGERY     FOOT SURGERY     HOT HEMOSTASIS N/A 04/16/2018   Procedure: HOT HEMOSTASIS (ARGON PLASMA  COAGULATION/BICAP);  Surgeon: Tami Falcon, MD;  Location: [facility];  Service: Endoscopy;  Laterality: N/A;   HOT HEMOSTASIS N/A 05/23/2020   Procedure: HOT HEMOSTASIS (ARGON PLASMA COAGULATION/BICAP);  Surgeon: Lajuan Pila, MD;  Location: [facility];  Service: Gastroenterology;  Laterality: N/A;   HOT HEMOSTASIS N/A 12/09/2020   Procedure: HOT HEMOSTASIS (ARGON PLASMA COAGULATION/BICAP);  Surgeon: Lindle Rhea, MD;  Location: [facility] ENDOSCOPY;  Service: Gastroenterology;  Laterality: N/A;   LYMPHADENECTOMY Bilateral 02/27/2014   Procedure: BILATERAL LYMPHADENECTOMY;  Surgeon: Osborn Blaze, MD;  Location: [facility];  Service: Urology;  Laterality: Bilateral;   POLYPECTOMY  05/23/2020   Procedure: POLYPECTOMY;  Surgeon: Lajuan Pila, MD;  Location: [facility] END[facility];   Service: Gastroenterology;;   PROSTATE BIOPSY  12/2013   Gleason 4+3=7, volume 31.31 cc   ROBOT ASSISTED LAPAROSCOPIC RADICAL PROSTATECTOMY N/A 02/27/2014   Procedure: ROBOTIC ASSISTED LAPAROSCOPIC RADICAL PROSTATECTOMY WITH INDOCYANINE GREEN DYE;  Surgeon: Osborn Blaze, MD;  Location: [facility];  Service: Urology;  Laterality: N/A;   Family History  Problem Relation Age of Onset   Heart disease Mother    Heart attack Father 73   Cancer Sister        breast   Colon cancer Neg Hx    Esophageal cancer Neg Hx    Rectal cancer Neg Hx    Stomach cancer Neg Hx    Social History   Socioeconomic History   Marital status: Married    Spouse name: Not on file   Number of children: Not on file   Years of education: Not on file   Highest education level: Not on file  Occupational History   Not on file  Tobacco Use   Smoking status: Every Day    Current packs/day: 0.50    Types: Cigarettes   Smokeless tobacco: Never   Tobacco comments:    1 ppd Wants Patches .  Stopped 1.5 weeks ago   Vaping Use   Vaping status: Never Used  Substance and Sexual Activity   Alcohol use: Not Currently    Alcohol/week: 1.0 standard drink of alcohol    Types: 1 Cans of beer per week   Drug use: Not Currently    Types: Marijuana, Cocaine, Heroin    Comment: past hx approx 30 years ago    Sexual activity: Not on file  Other Topics Concern   Not on file  Social History Narrative   Not on file   Social Drivers of Health   Financial Resource Strain: Low Risk  (09/21/2023)   Overall Financial Resource Strain (CARDIA)    Difficulty of Paying Living Expenses: Not hard at all  Food Insecurity: No Food Insecurity (09/21/2023)   Hunger Vital Sign    Worried About Running Out of Food in the Last Year: Never true    Ran Out of Food in the Last Year: Never true  Transportation Needs: No Transportation Needs (09/21/2023)   PRAPARE - Administrator, Civil Service (Medical): No    Lack of Transportation  (Non-Medical): No  Physical Activity: Insufficiently Active (09/21/2023)   Exercise Vital Sign    Days of Exercise per Week: 2 days    Minutes of Exercise per Session: 30 min  Stress: No Stress Concern Present (09/21/2023)   [employer] of Occupational Health - Occupational Stress Questionnaire    Feeling of Stress : Not at all  Social Connections: Socially Isolated (09/21/2023)   Social Connection and Isolation Panel [NHANES]    Frequency of Communication with Friends  and Family: Three times a week    Frequency of Social Gatherings with Friends and Family: Once a week    Attends Religious Services: Never    Database administrator or Organizations: No    Attends Engineer, structural: Never    Marital Status: Divorced    Tobacco Counseling Ready to quit: Not Answered Counseling given: Not Answered Tobacco comments: 1 ppd Wants Patches .  Stopped 1.5 weeks ago     Clinical Intake:  Pre-visit preparation completed: Yes  Pain : No/denies pain Pain Score: 0-No pain     BMI - recorded: 19.67 Nutritional Status: BMI of 19-24  Normal Nutritional Risks: None Diabetes: No  Lab Results  Component Value Date   HGBA1C 5.6 06/13/2023   HGBA1C 5.8 (H) 08/14/2022   HGBA1C 7.1 (H) 12/04/2021     How often do you need to have someone help you when you read instructions, pamphlets, or other written materials from your doctor or pharmacy?: 1 - Never  Interpreter Needed?: No  Information entered by :: Gregory Whitwell N. Svea Pusch, LPN.   Activities of Daily Living     09/21/2023   11:24 AM 06/14/2023   12:00 PM  In your present state of health, do you have any difficulty performing the following activities:  Hearing? 0 0  Vision? 0 0  Difficulty concentrating or making decisions? 1 1  Walking or climbing stairs? 0   Dressing or bathing? 0   Doing errands, shopping? 0   Preparing Food and eating ? N   Using the Toilet? N   In the past six months, have you accidently leaked  urine? N   Do you have problems with loss of bowel control? N   Managing your Medications? N   Managing your Finances? N   Housekeeping or managing your Housekeeping? N     Patient Care Team: Malen Scudder, DO as PCP - General (Internal Medicine) Charle Congo, MD (Internal Medicine)  I have updated your Care Teams any recent Medical Services you may have received from other providers in the past year.     Assessment:    This is a routine wellness examination for [facility].  Hearing/Vision screen Hearing Screening - Comments:: Denies hearing difficulties.  Vision Screening - Comments:: Patient wears readers. Patient not up to date with annual eye exam.   Goals Addressed             This Visit's Progress    Client understands the importance of follow-up with providers by attending scheduled visits         Depression Screen     09/21/2023   11:22 AM 11/03/2022    3:49 PM 10/14/2022    9:25 AM 10/14/2022    9:13 AM 10/14/2022    9:09 AM 12/04/2021    9:22 AM 06/08/2021    2:57 PM  PHQ 2/9 Scores  PHQ - 2 Score  0 0 0 0 0 0  PHQ- 9 Score       0  Exception Documentation Other- indicate reason in comment box        Not completed Patient not able to answer          Fall Risk     09/21/2023   11:21 AM 11/23/2022    3:42 PM 11/03/2022    3:42 PM 10/14/2022    9:13 AM 10/14/2022    9:10 AM  Fall Risk   Falls in the past year? 1 1 1 1  1  Number falls in past yr: 0 1 1 1 1   Injury with Fall? 0 0 0 1 1  Risk for fall due to : No Fall Risks Impaired balance/gait History of fall(s);Impaired balance/gait Impaired mobility Impaired balance/gait  Follow up Falls evaluation completed;Education provided Falls evaluation completed;Falls prevention discussed Falls evaluation completed;Falls prevention discussed Falls prevention discussed;Falls evaluation completed Falls evaluation completed;Falls prevention discussed    MEDICARE RISK AT HOME:  Medicare Risk at Home Any stairs in or around  the home?: Yes (elevators) If so, are there any without handrails?: No Home free of loose throw rugs in walkways, pet beds, electrical cords, etc?: Yes Adequate lighting in your home to reduce risk of falls?: Yes Life alert?: No Use of a cane, walker or w/c?: Yes Grab bars in the bathroom?: No Shower chair or bench in shower?: No Elevated toilet seat or a handicapped toilet?: No  TIMED UP AND GO:  Was the test performed?  No  Cognitive Function: Unable: Due to language barrier, hearing or vision limitations or other.    09/21/2023   11:27 AM  MMSE - Mini Mental State Exam  Not completed: Unable to complete        10/14/2022    9:13 AM  6CIT Screen  What Year? 0 points  What month? 0 points  What time? 3 points  Count back from 20 0 points  Months in reverse 4 points  Repeat phrase 0 points  Total Score 7 points    Immunizations Immunization History  Administered Date(s) Administered   Fluad Quad(high Dose 65+) 06/08/2021   Hepatitis A, Adult 11/23/2022   Hepatitis B, ADULT 11/23/2022   Influenza, High Dose Seasonal PF 12/22/2018   PFIZER(Purple Top)SARS-COV-2 Vaccination 06/21/2019, 07/17/2019   PNEUMOCOCCAL CONJUGATE-20 11/06/2020   Tdap 08/19/2019    Screening Tests Health Maintenance  Topic Date Due   Diabetic kidney evaluation - Urine ACR  Never done   Zoster Vaccines- Shingrix (1 of 2) Never done   COVID-19 Vaccine (3 - Pfizer risk series) 08/14/2019   Colonoscopy  05/23/2021   OPHTHALMOLOGY EXAM  12/05/2021   FOOT EXAM  10/14/2023   INFLUENZA VACCINE  11/18/2023   HEMOGLOBIN A1C  12/11/2023   Diabetic kidney evaluation - eGFR measurement  08/14/2024   Medicare Annual Wellness (AWV)  09/20/2024   DTaP/Tdap/Td (2 - Td or Tdap) 08/18/2029   Pneumonia Vaccine 10+ Years old  Completed   Hepatitis C Screening  Completed   HPV VACCINES  Aged Out   Meningococcal B Vaccine  Aged Out    Health Maintenance  Health Maintenance Due  Topic Date Due    Diabetic kidney evaluation - Urine ACR  Never done   Zoster Vaccines- Shingrix (1 of 2) Never done   COVID-19 Vaccine (3 - Pfizer risk series) 08/14/2019   Colonoscopy  05/23/2021   OPHTHALMOLOGY EXAM  12/05/2021   Health Maintenance Items Addressed: Yes Patient aware of current care gaps.  Immunization record was verified by [facility] and updated in patient's chart. Patient is overdue for the following: eye exam, Shingrix, Covid-19 vaccine, Colonoscopy and Urine ACR.  Additional Screening:  Vision Screening: Recommended annual ophthalmology exams for early detection of glaucoma and other disorders of the eye. Would you like a referral to an eye doctor? Yes    Dental Screening: Recommended annual dental exams for proper oral hygiene  Community Resource Referral / Chronic Care Management: CRR required this visit?  No   CCM required this visit?  No  Plan:    I have personally reviewed and noted the following in the patient's chart:   Medical and social history Use of alcohol, tobacco or illicit drugs  Current medications and supplements including opioid prescriptions. Patient is not currently taking opioid prescriptions. Functional ability and status Nutritional status Physical activity Advanced directives List of other physicians Hospitalizations, surgeries, and ER visits in previous 12 months Vitals Screenings to include cognitive, depression, and falls Referrals and appointments  In addition, I have reviewed and discussed with patient certain preventive protocols, quality metrics, and best practice recommendations. A written personalized care plan for preventive services as well as general preventive health recommendations were provided to patient.   Gregory Sheldon, LPN   04/27/1476   After Visit Summary: (Declined) Due to this being a telephonic visit, with patients personalized plan was offered to patient but patient Declined AVS at this time   Notes: Patient aware of  current care gaps.  Immunization record was verified by [facility] and updated in patient's chart. Patient is overdue for the following: eye exam, Shingrix, Covid-19 vaccine, Colonoscopy and Urine ACR.

## 2023-09-21 NOTE — Telephone Encounter (Signed)
  FYI Only or Action Required?: FYI only for provider  Patient was last seen in primary care on 08/17/2023. Called Nurse Triage reporting Mental Health Problem. Symptoms began several weeks ago. Interventions attempted: Nothing. Symptoms are: gradually worsening.  Triage Disposition: Go to ED Now (Notify PCP)  Patient/caregiver understands and will follow disposition?: Yes   ------------------------------------------------------ Reason for Triage: nephew [name] calling stating that his uncle Gregory Nunez is starting to wander the halls of his apartment complex, (complaints from ofc and other tenants)  forgetting where he is at, forgetting words and would like advice on what he should do about this    Reason for Disposition  Very strange or paranoid behavior  Answer Assessment - Initial Assessment Questions 1. LEVEL OF CONSCIOUSNESS: "How is he (she, the patient) acting right now?" (e.g., alert-oriented, confused, lethargic, stuporous, comatose)     ------Alert and confused.  ---------- Nephew received calls that he was wandering around apt. ( Gregory NOT near by during the call),    2. ONSET: "When did the confusion start?"  (minutes, hours, days)  ---- A few days    3. PATTERN "Does this come and go, or has it been constant since it started?"  "Is it present now?"  -------- It seems to come and go   4. ALCOHOL or DRUGS: "Has he been drinking alcohol or taking any drugs?"     -------------- Denies    5. NARCOTIC MEDICINES: "Has he been receiving any narcotic medications?" (e.g., morphine , Vicodin)     ------------Denies   6. CAUSE: "What do you think is causing the confusion?"      ---- Unsure   7. OTHER SYMPTOMS: "Are there any other symptoms?" (e.g., difficulty breathing, headache, fever, weakness)     -------Unable to answer     Additional Information: -- Hx: Has has previous episodes of acute confusion 07/2023  --- Nephew called. Not near patient, unable to garner  additional info. --- Gregory has a caregiver - Comes for 4 hrs  ---Nephew will ultimately like to discuss options for long-term care. ---Referred to ED, but f/u appt scheduled 09/22/23  Protocols used: Confusion - Delirium-A-AH

## 2023-09-22 ENCOUNTER — Observation Stay (HOSPITAL_COMMUNITY)

## 2023-09-22 ENCOUNTER — Ambulatory Visit: Payer: Self-pay | Admitting: Internal Medicine

## 2023-09-22 ENCOUNTER — Emergency Department (HOSPITAL_COMMUNITY)

## 2023-09-22 DIAGNOSIS — J449 Chronic obstructive pulmonary disease, unspecified: Secondary | ICD-10-CM | POA: Diagnosis present

## 2023-09-22 DIAGNOSIS — F0390 Unspecified dementia without behavioral disturbance: Secondary | ICD-10-CM

## 2023-09-22 DIAGNOSIS — I69322 Dysarthria following cerebral infarction: Secondary | ICD-10-CM | POA: Diagnosis not present

## 2023-09-22 DIAGNOSIS — E113293 Type 2 diabetes mellitus with mild nonproliferative diabetic retinopathy without macular edema, bilateral: Secondary | ICD-10-CM | POA: Diagnosis present

## 2023-09-22 DIAGNOSIS — I6389 Other cerebral infarction: Secondary | ICD-10-CM | POA: Diagnosis not present

## 2023-09-22 DIAGNOSIS — G9340 Encephalopathy, unspecified: Secondary | ICD-10-CM | POA: Diagnosis present

## 2023-09-22 DIAGNOSIS — I651 Occlusion and stenosis of basilar artery: Secondary | ICD-10-CM | POA: Diagnosis present

## 2023-09-22 DIAGNOSIS — F1097 Alcohol use, unspecified with alcohol-induced persisting dementia: Secondary | ICD-10-CM

## 2023-09-22 DIAGNOSIS — I6381 Other cerebral infarction due to occlusion or stenosis of small artery: Secondary | ICD-10-CM | POA: Diagnosis present

## 2023-09-22 DIAGNOSIS — R569 Unspecified convulsions: Secondary | ICD-10-CM

## 2023-09-22 DIAGNOSIS — Z880 Allergy status to penicillin: Secondary | ICD-10-CM | POA: Diagnosis not present

## 2023-09-22 DIAGNOSIS — R4701 Aphasia: Secondary | ICD-10-CM | POA: Diagnosis present

## 2023-09-22 DIAGNOSIS — I10 Essential (primary) hypertension: Secondary | ICD-10-CM | POA: Diagnosis not present

## 2023-09-22 DIAGNOSIS — G40909 Epilepsy, unspecified, not intractable, without status epilepticus: Secondary | ICD-10-CM | POA: Diagnosis present

## 2023-09-22 DIAGNOSIS — E512 Wernicke's encephalopathy: Secondary | ICD-10-CM

## 2023-09-22 DIAGNOSIS — I639 Cerebral infarction, unspecified: Secondary | ICD-10-CM | POA: Diagnosis present

## 2023-09-22 DIAGNOSIS — N184 Chronic kidney disease, stage 4 (severe): Secondary | ICD-10-CM | POA: Diagnosis present

## 2023-09-22 DIAGNOSIS — D709 Neutropenia, unspecified: Secondary | ICD-10-CM | POA: Insufficient documentation

## 2023-09-22 DIAGNOSIS — R4182 Altered mental status, unspecified: Secondary | ICD-10-CM

## 2023-09-22 DIAGNOSIS — E1159 Type 2 diabetes mellitus with other circulatory complications: Secondary | ICD-10-CM

## 2023-09-22 DIAGNOSIS — Z7951 Long term (current) use of inhaled steroids: Secondary | ICD-10-CM | POA: Diagnosis not present

## 2023-09-22 DIAGNOSIS — E1122 Type 2 diabetes mellitus with diabetic chronic kidney disease: Secondary | ICD-10-CM | POA: Diagnosis present

## 2023-09-22 DIAGNOSIS — Z7902 Long term (current) use of antithrombotics/antiplatelets: Secondary | ICD-10-CM | POA: Diagnosis not present

## 2023-09-22 DIAGNOSIS — Z79899 Other long term (current) drug therapy: Secondary | ICD-10-CM | POA: Diagnosis not present

## 2023-09-22 DIAGNOSIS — N179 Acute kidney failure, unspecified: Secondary | ICD-10-CM | POA: Diagnosis not present

## 2023-09-22 DIAGNOSIS — F0150 Vascular dementia without behavioral disturbance: Secondary | ICD-10-CM | POA: Diagnosis present

## 2023-09-22 DIAGNOSIS — I152 Hypertension secondary to endocrine disorders: Secondary | ICD-10-CM

## 2023-09-22 DIAGNOSIS — G8324 Monoplegia of upper limb affecting left nondominant side: Secondary | ICD-10-CM | POA: Diagnosis present

## 2023-09-22 DIAGNOSIS — E639 Nutritional deficiency, unspecified: Secondary | ICD-10-CM

## 2023-09-22 DIAGNOSIS — F17210 Nicotine dependence, cigarettes, uncomplicated: Secondary | ICD-10-CM | POA: Diagnosis present

## 2023-09-22 DIAGNOSIS — R29705 NIHSS score 5: Secondary | ICD-10-CM

## 2023-09-22 DIAGNOSIS — E785 Hyperlipidemia, unspecified: Secondary | ICD-10-CM | POA: Diagnosis present

## 2023-09-22 DIAGNOSIS — I739 Peripheral vascular disease, unspecified: Secondary | ICD-10-CM | POA: Diagnosis not present

## 2023-09-22 DIAGNOSIS — E8809 Other disorders of plasma-protein metabolism, not elsewhere classified: Secondary | ICD-10-CM | POA: Diagnosis present

## 2023-09-22 LAB — PROTIME-INR
INR: 1.1 (ref 0.8–1.2)
Prothrombin Time: 14 s (ref 11.4–15.2)

## 2023-09-22 LAB — URINALYSIS, ROUTINE W REFLEX MICROSCOPIC
Bilirubin Urine: NEGATIVE
Glucose, UA: NEGATIVE mg/dL
Ketones, ur: NEGATIVE mg/dL
Leukocytes,Ua: NEGATIVE
Nitrite: NEGATIVE
Protein, ur: 100 mg/dL — AB
Specific Gravity, Urine: 1.015 (ref 1.005–1.030)
pH: 6 (ref 5.0–8.0)

## 2023-09-22 LAB — RAPID URINE DRUG SCREEN, HOSP PERFORMED
Amphetamines: NOT DETECTED
Barbiturates: NOT DETECTED
Benzodiazepines: NOT DETECTED
Cocaine: NOT DETECTED
Opiates: NOT DETECTED
Tetrahydrocannabinol: NOT DETECTED

## 2023-09-22 LAB — ETHANOL: Alcohol, Ethyl (B): <15 mg/dL (ref <15)

## 2023-09-22 LAB — LIPID PANEL
Cholesterol: 146 mg/dL (ref 0–200)
HDL: 63 mg/dL (ref >40)
LDL Cholesterol: 73 mg/dL (ref 0–99)
Total CHOL/HDL Ratio: 2.3 ratio
Triglycerides: 51 mg/dL (ref <150)
VLDL: 10 mg/dL (ref 0–40)

## 2023-09-22 LAB — URINALYSIS, MICROSCOPIC (REFLEX)

## 2023-09-22 LAB — I-STAT CHEM 8, ED
BUN: 73 mg/dL — ABNORMAL HIGH (ref 8–23)
Calcium, Ion: 1.17 mmol/L (ref 1.15–1.40)
Chloride: 110 mmol/L (ref 98–111)
Creatinine, Ser: 4.1 mg/dL — ABNORMAL HIGH (ref 0.61–1.24)
Glucose, Bld: 133 mg/dL — ABNORMAL HIGH (ref 70–99)
HCT: 27 % — ABNORMAL LOW (ref 39.0–52.0)
Hemoglobin: 9.2 g/dL — ABNORMAL LOW (ref 13.0–17.0)
Potassium: 4.3 mmol/L (ref 3.5–5.1)
Sodium: 142 mmol/L (ref 135–145)
TCO2: 19 mmol/L — ABNORMAL LOW (ref 22–32)

## 2023-09-22 LAB — HEMOGLOBIN A1C
Hgb A1c MFr Bld: 5.1 % (ref 4.8–5.6)
Mean Plasma Glucose: 99.67 mg/dL

## 2023-09-22 LAB — VITAMIN B12: Vitamin B-12: 383 pg/mL (ref 180–914)

## 2023-09-22 LAB — HIV ANTIBODY (ROUTINE TESTING W REFLEX): HIV Screen 4th Generation wRfx: NONREACTIVE

## 2023-09-22 LAB — CBG MONITORING, ED: Glucose-Capillary: 84 mg/dL (ref 70–99)

## 2023-09-22 LAB — FOLATE: Folate: 8.7 ng/mL (ref >5.9)

## 2023-09-22 LAB — APTT: aPTT: 34 s (ref 24–36)

## 2023-09-22 LAB — RPR: RPR Ser Ql: NONREACTIVE

## 2023-09-22 LAB — TSH: TSH: 1.04 u[IU]/mL (ref 0.350–4.500)

## 2023-09-22 MED ORDER — ASPIRIN 81 MG PO TBEC
81.0000 mg | DELAYED_RELEASE_TABLET | Freq: Every day | ORAL | Status: DC
  Administered 2023-09-22 – 2023-09-24 (×3): 81 mg via ORAL
  Filled 2023-09-22 (×3): qty 1

## 2023-09-22 MED ORDER — THIAMINE HCL 100 MG/ML IJ SOLN
500.0000 mg | Freq: Three times a day (TID) | INTRAVENOUS | Status: AC
  Administered 2023-09-22 – 2023-09-23 (×6): 500 mg via INTRAVENOUS
  Filled 2023-09-22 (×10): qty 5
  Filled 2023-09-22: qty 466.67
  Filled 2023-09-22: qty 5

## 2023-09-22 MED ORDER — LOSARTAN POTASSIUM 25 MG PO TABS
25.0000 mg | ORAL_TABLET | Freq: Every day | ORAL | Status: DC
  Administered 2023-09-23: 25 mg via ORAL
  Filled 2023-09-22: qty 1

## 2023-09-22 MED ORDER — AMLODIPINE BESYLATE 5 MG PO TABS
10.0000 mg | ORAL_TABLET | Freq: Every day | ORAL | Status: DC
  Administered 2023-09-22 – 2023-09-24 (×3): 10 mg via ORAL
  Filled 2023-09-22: qty 2
  Filled 2023-09-22: qty 4
  Filled 2023-09-22: qty 2

## 2023-09-22 MED ORDER — THIAMINE HCL 100 MG/ML IJ SOLN: 100.0000 mg | Freq: Every day | INTRAMUSCULAR | Status: DC

## 2023-09-22 MED ORDER — HYDRALAZINE HCL 25 MG PO TABS
25.0000 mg | ORAL_TABLET | Freq: Three times a day (TID) | ORAL | Status: DC
  Administered 2023-09-22 – 2023-09-24 (×6): 25 mg via ORAL
  Filled 2023-09-22 (×6): qty 1

## 2023-09-22 MED ORDER — ATORVASTATIN CALCIUM 40 MG PO TABS: 40.0000 mg | ORAL_TABLET | Freq: Every day | ORAL | Status: DC

## 2023-09-22 MED ORDER — ATORVASTATIN CALCIUM 80 MG PO TABS
80.0000 mg | ORAL_TABLET | Freq: Every day | ORAL | Status: DC
  Administered 2023-09-22 – 2023-09-24 (×3): 80 mg via ORAL
  Filled 2023-09-22: qty 2
  Filled 2023-09-22 (×2): qty 1

## 2023-09-22 MED ORDER — THIAMINE HCL 100 MG/ML IJ SOLN
250.0000 mg | Freq: Every day | INTRAVENOUS | Status: DC
  Administered 2023-09-24: 250 mg via INTRAVENOUS
  Filled 2023-09-22: qty 2.5

## 2023-09-22 MED ORDER — ENOXAPARIN SODIUM 30 MG/0.3ML IJ SOSY
30.0000 mg | PREFILLED_SYRINGE | INTRAMUSCULAR | Status: DC
  Administered 2023-09-22 – 2023-09-24 (×3): 30 mg via SUBCUTANEOUS
  Filled 2023-09-22 (×3): qty 0.3

## 2023-09-22 MED ORDER — ASPIRIN 300 MG RE SUPP: 300.0000 mg | Freq: Every day | RECTAL | Status: DC

## 2023-09-22 MED ORDER — CLOPIDOGREL BISULFATE 75 MG PO TABS
75.0000 mg | ORAL_TABLET | Freq: Every day | ORAL | Status: DC
  Administered 2023-09-22 – 2023-09-24 (×3): 75 mg via ORAL
  Filled 2023-09-22 (×3): qty 1

## 2023-09-22 NOTE — ED Notes (Signed)
 EEG at bedside.

## 2023-09-22 NOTE — ED Notes (Signed)
 Patient transported to MRI

## 2023-09-22 NOTE — ED Notes (Signed)
 MD at bedside.

## 2023-09-22 NOTE — Evaluation (Signed)
 Clinical/Bedside Swallow Evaluation Patient Details  Name: Gregory Nunez. MRN: [id number] Date of Birth: [birth year]  Today's Date: 09/22/2023 Time: SLP Start Time (ACUTE ONLY): 1106 SLP Stop Time (ACUTE ONLY): 1128 SLP Time Calculation (min) (ACUTE ONLY): 22 min  Past Medical History:  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   Patient is approximately 5-week status post bunion correction left foot   Hyperlipidemia    Hypertension    Hypertensive retinopathy    OU   Hypoglycemia 10/22/2020   Malignant neoplasm of prostate (HCC) 01/09/2014   Neuropathy    Pancreatitis    Pneumococcal vaccination administered at current visit 11/10/2020   Prostate cancer (HCC) 12/19/2013   Gleason 4+3=7, volume 31.31 cc   Rhabdomyolysis 04/12/2021   Sciatica    Shortness of breath dyspnea    with exertion    Past Surgical History:  Past Surgical History:  Procedure Laterality Date   BIOPSY  04/16/2018   Procedure: BIOPSY;  Surgeon: Tami Falcon, MD;  Location: [facility];  Service: Endoscopy;;   BIOPSY  05/23/2020   Procedure: BIOPSY;  Surgeon: Lajuan Pila, MD;  Location: [facility];  Service: Gastroenterology;;  EGD and COLON   BIOPSY  12/09/2020   Procedure: BIOPSY;  Surgeon: Lindle Rhea, MD;  Location: [facility] ENDOSCOPY;  Service: Gastroenterology;;   biopsy on throat     hx of    CATARACT EXTRACTION Bilateral    Dr. Donovan Gallant   COLONOSCOPY N/A  05/23/2020   Procedure: COLONOSCOPY;  Surgeon: Lajuan Pila, MD;  Location: [facility];  Service: Gastroenterology;  Laterality: N/A;   ENTEROSCOPY N/A 10/07/2020   Procedure: ENTEROSCOPY;  Surgeon: Kenney Peacemaker, MD;  Location: [facility]COPY;  Service: Endoscopy;  Laterality: N/A;   ESOPHAGOGASTRODUODENOSCOPY Left 04/16/2018   Procedure: ESOPHAGOGASTRODUODENOSCOPY (EGD);  Surgeon: Tami Falcon, MD;  Location: [facility];  Service: Endoscopy;  Laterality: Left;   ESOPHAGOGASTRODUODENOSCOPY (EGD) WITH PROPOFOL  N/A 05/23/2020   Procedure: ESOPHAGOGASTRODUODENOSCOPY (EGD) WITH PROPOFOL ;  Surgeon: Lajuan Pila, MD;  Location: [facility];  Service: Gastroenterology;  Laterality: N/A;   ESOPHAGOGASTRODUODENOSCOPY (EGD) WITH PROPOFOL  N/A 12/09/2020   Procedure: ESOPHAGOGASTRODUODENOSCOPY (EGD) WITH PROPOFOL ;  Surgeon: Lindle Rhea, MD;  Location: [facility] ENDOSCOPY;  Service: Gastroenterology;  Laterality: N/A;   EYE SURGERY     FOOT SURGERY     HOT HEMOSTASIS N/A 04/16/2018   Procedure: HOT HEMOSTASIS (ARGON PLASMA COAGULATION/BICAP);  Surgeon: Tami Falcon, MD;  Location: [facility];  Service: Endoscopy;  Laterality: N/A;   HOT HEMOSTASIS N/A 05/23/2020   Procedure: HOT HEMOSTASIS (ARGON PLASMA COAGULATION/BICAP);  Surgeon: Lajuan Pila, MD;  Location: [facility];  Service: Gastroenterology;  Laterality: N/A;   HOT HEMOSTASIS N/A 12/09/2020   Procedure: HOT HEMOSTASIS (ARGON PLASMA COAGULATION/BICAP);  Surgeon: Lindle Rhea, MD;  Location: [facility] ENDOSCOPY;  Service: Gastroenterology;  Laterality: N/A;   LYMPHADENECTOMY Bilateral 02/27/2014   Procedure: BILATERAL LYMPHADENECTOMY;  Surgeon: Osborn Blaze, MD;  Location: [facility];  Service: Urology;  Laterality: Bilateral;   POLYPECTOMY  05/23/2020   Procedure: POLYPECTOMY;  Surgeon: Lajuan Pila, MD;  Location: [facility]OS[facility];  Service: Gastroenterology;;   PROSTATE BIOPSY  12/2013   Gleason 4+3=7, volume 31.31 cc   ROBOT ASSISTED LAPAROSCOPIC  RADICAL PROSTATECTOMY N/A 02/27/2014   Procedure: ROBOTIC ASSISTED LAPAROSCOPIC RADICAL PROSTATECTOMY WITH INDOCYANINE GREEN DYE;  Surgeon: Osborn Blaze, MD;  Location: [facility];  Service: Urology;  Laterality: N/A;   HPI:  Gregory Nunez. is a 78 y.o. male who presented to ED for confusion and expressive aphasia, admitted for stroke. Has history of heavy EtOH. MRI with findings of small acute infarcts in the left thalamus and left periarterial white matter.[name], nephew and POA, reported to MD that confusion started somewhere about 3 weeks ago, patient had difficulty with remembering things and was wandering around.  Reports the patient lives alone and he would receive messages from the residence nearby the patient was wandering around and was having hard time remembering.  Reports that aide comes by daily to give medications in the morning and family drops by in the evening time to give medications.  Reports that patient chronically had expressive aphasia where he would have difficulty with words however it appears to be it worsened about a month ago where patient had difficulty finding words and unable to carry out full conversations.  with PMH of hypertension, type 2 diabetes, CKD stage IV, seizures, stroke. 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 Pt struggled with phrase and sentence length language.    Assessment / Plan / Recommendation  Clinical Impression  Pt demonstrates no acute signs of dysphagia or aspiration. When masticating regular solids pt overpacks his mouth and has no dentures. He is able to break pretzels with his teeth. Suspect he eats regular solids without dentition at baseline, though he cant report. He needed extra time to figure out opening containers and refused assistance when offered. Unsure if this is a cognitive change from baseline. Will start a regular diet with thin liquids while admitted and f/u once for tolerance of texture. SLP Visit  Diagnosis: Dysphagia, unspecified (R13.10)    Aspiration Risk  Mild aspiration risk    Diet Recommendation Regular;Thin liquid    Liquid Administration via: Cup;Straw Medication Administration: Whole meds with liquid Supervision: Patient able to self feed Compensations: Minimize environmental distractions Postural Changes: Seated upright at 90 degrees    Other  Recommendations       Assistance Recommended at Discharge    Functional Status Assessment Patient has not had a recent decline in their functional status  Frequency and Duration min 2x/week  2 weeks       Prognosis Prognosis for improved oropharyngeal function: Good Barriers to Reach Goals: Cognitive deficits;Language deficits      Swallow Study   General HPI: Behr Cislo. is a 78 y.o. male who presented to ED for confusion and expressive aphasia, admitted for stroke. Has history of heavy EtOH. MRI with findings of small acute infarcts in the left thalamus and left periarterial white matter.[name], nephew and POA, reported to MD that confusion started somewhere about 3 weeks ago, patient had difficulty with remembering things and was wandering around.  Reports the patient lives alone and he would receive messages from the residence nearby the patient was wandering around and was having hard time remembering.  Reports that aide comes by daily to give medications in the morning and family drops by in the evening time to give medications.  Reports that patient chronically had expressive aphasia where he would have difficulty with words however it appears to be it worsened about a month ago where patient had difficulty finding words and unable to carry out full conversations.  with PMH of hypertension, type 2 diabetes, CKD stage IV, seizures, stroke. 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 Pt struggled with phrase and sentence length language. Type of Study: Bedside Swallow Evaluation Diet  Prior to this Study: NPO Temperature Spikes Noted: No Respiratory Status: Room air History of Recent Intubation: No Behavior/Cognition: Alert;Cooperative;Pleasant mood Oral Cavity Assessment: Within Functional Limits Oral Care Completed by SLP: No Oral Cavity - Dentition: Edentulous;Dentures, not available Vision: Functional for self-feeding Self-Feeding Abilities: Able to feed self;Needs set up Patient Positioning: Upright in bed Baseline Vocal Quality: Normal Volitional Cough: Cognitively unable to elicit Volitional Swallow: Unable to elicit    Oral/Motor/Sensory Function Overall Oral Motor/Sensory Function: Mild impairment Facial ROM: Within Functional Limits Facial Symmetry: Within Functional Limits Facial Strength: Within Functional Limits Facial Sensation: Within Functional Limits Lingual ROM: Within Functional Limits Lingual Symmetry: Within Functional Limits Lingual Strength: Within Functional Limits Mandible: Within Functional Limits   Ice Chips     Thin Liquid Thin Liquid: Within functional limits Presentation: Straw;Self Fed    Nectar Thick Nectar Thick Liquid: Not tested   Honey Thick Honey Thick Liquid: Not tested   Puree Puree: Within functional limits Presentation: Spoon   Solid     Solid: Impaired Presentation: Self Fed Oral Phase Impairments: Impaired mastication Oral Phase Functional Implications: Impaired mastication      Corsica Franson, Hardin Leys 09/22/2023,11:38 AM

## 2023-09-22 NOTE — ED Notes (Signed)
 Patient in MRI at this time.

## 2023-09-22 NOTE — ED Notes (Signed)
 PT at bedside.

## 2023-09-22 NOTE — Evaluation (Addendum)
 Occupational Therapy Evaluation Patient Details Name: Gregory Nunez. MRN: [id number] DOB: [birth year] Today's Date: 09/22/2023   History of Present Illness   78 y.o. male adm 09/21/23  with hx of EtOH use, prior hx of withdrawal seizure, stroke who was brought in for confusion.wandering, and aphasia.  Per the chart he has baseline aphaisa and confusion, but family notes it is worse.  MR Brain:Small acute infarcts in the left thalamus and left periatrial  white matter.  Advanced chronic microvascular ischemic disease.     Clinical Impressions Patient admitted for the diagnosis above.  PTA he lives alone in a one level apartment on the eighth floor.  Normally he walks with a SPC, hx of falls, and completes his own ADL.  His nephew assists with groceries and community mobility.  Deficits impacting function are listed below, currently he is needing up to Mod A for ADL completion and Min A for basic mobility.  OT will follow in the acute setting to address deficits and Patient will benefit from intensive inpatient follow-up therapy, >3 hours/day.     If plan is discharge home, recommend the following:   Assist for transportation;Assistance with cooking/housework;A lot of help with bathing/dressing/bathroom;A little help with walking and/or transfers     Functional Status Assessment   Patient has had a recent decline in their functional status and demonstrates the ability to make significant improvements in function in a reasonable and predictable amount of time.     Equipment Recommendations   BSC/3in1     Recommendations for Other Services         Precautions/Restrictions   Precautions Precautions: Fall;Other (comment) Precaution/Restrictions Comments: Watch BP Restrictions Weight Bearing Restrictions Per Provider Order: No     Mobility Bed Mobility Overal bed mobility: Needs Assistance Bed Mobility: Supine to Sit, Sit to Supine     Supine to sit: Contact  guard Sit to supine: Contact guard assist        Transfers Overall transfer level: Needs assistance   Transfers: Sit to/from Stand, Bed to chair/wheelchair/BSC Sit to Stand: Contact guard assist, Min assist     Step pivot transfers: Min assist            Balance Overall balance assessment: Needs assistance Sitting-balance support: Feet supported Sitting balance-Leahy Scale: Fair   Postural control: Posterior lean Standing balance support: Reliant on assistive device for balance Standing balance-Leahy Scale: Poor                             ADL either performed or assessed with clinical judgement   ADL Overall ADL's : Needs assistance/impaired Eating/Feeding: Set up;Sitting   Grooming: Wash/dry hands;Minimal assistance;Standing   Upper Body Bathing: Minimal assistance;Sitting   Lower Body Bathing: Moderate assistance;Sit to/from stand   Upper Body Dressing : Moderate assistance;Sitting   Lower Body Dressing: Moderate assistance;Sit to/from stand   Toilet Transfer: Minimal assistance;Rolling walker (2 wheels);Regular Toilet                   Vision   Vision Assessment?: No apparent visual deficits Additional Comments: Does run into objects on the R side consistently.     Perception Perception: Within Functional Limits       Praxis Praxis: WFL       Pertinent Vitals/Pain Pain Assessment Pain Assessment: No/denies pain     Extremity/Trunk Assessment Upper Extremity Assessment Upper Extremity Assessment: RUE deficits/detail;Right hand dominant RUE Deficits / Details: Grip  is weaker than L, but functional.  Good AROM RUE Sensation: WNL RUE Coordination: decreased fine motor;decreased gross motor   Lower Extremity Assessment Lower Extremity Assessment: Defer to PT evaluation   Cervical / Trunk Assessment Cervical / Trunk Assessment: Kyphotic   Communication Communication Communication: Impaired Factors Affecting Communication:  Difficulty expressing self   Cognition Arousal: Alert Behavior During Therapy: WFL for tasks assessed/performed Cognition: History of cognitive impairments, Cognition impaired   Orientation impairments: Time, Situation   Memory impairment (select all impairments): Short-term memory, Working memory Attention impairment (select first level of impairment): Focused attention Executive functioning impairment (select all impairments): Sequencing, Reasoning, Problem solving                   Following commands: Impaired Following commands impaired: Follows one step commands inconsistently, Follows one step commands with increased time     Cueing  General Comments   Cueing Techniques: Verbal cues;Gestural cues;Tactile cues   Elevated BP, session cleared with RN   Exercises     Shoulder Instructions      Home Living Family/patient expects to be discharged to:: Other (Comment) ([facility] Living highrise.) Living Arrangements: Alone Available Help at Discharge: Family;Available PRN/intermittently Type of Home: Apartment Home Access: Elevator     Home Layout: One level     Bathroom Shower/Tub: Producer, television/film/video: Standard Bathroom Accessibility: Yes How Accessible: Accessible via walker Home Equipment: Grab bars - tub/shower;Cane - single point;Shower seat   Additional Comments: No family present today. Pt did state he walks with a cane and his nephews assist with getting groceries.      Prior Functioning/Environment Prior Level of Function : Patient poor historian/Family not available;History of Falls (last six months)               ADLs Comments: Does not drive, nephew drives him to get groceries/food; does not cook, warms up frozen meals or picks up meals; shower seat.    OT Problem List: Decreased strength;Decreased activity tolerance;Impaired balance (sitting and/or standing);Decreased coordination;Impaired UE functional use;Decreased safety  awareness;Decreased cognition   OT Treatment/Interventions: Self-care/ADL training;Therapeutic activities;Patient/family education;DME and/or AE instruction;Balance training      OT Goals(Current goals can be found in the care plan section)   Acute Rehab OT Goals OT Goal Formulation: Patient unable to participate in goal setting Time For Goal Achievement: 10/06/23 Potential to Achieve Goals: Fair   OT Frequency:  Min 2X/week    Co-evaluation              AM-PAC OT "6 Clicks" Daily Activity     Outcome Measure Help from another person eating meals?: A Little Help from another person taking care of personal grooming?: A Little Help from another person toileting, which includes using toliet, bedpan, or urinal?: A Lot Help from another person bathing (including washing, rinsing, drying)?: A Lot Help from another person to put on and taking off regular upper body clothing?: A Lot Help from another person to put on and taking off regular lower body clothing?: A Lot 6 Click Score: 14   End of Session Equipment Utilized During Treatment: Gait belt;Rolling walker (2 wheels) Nurse Communication: Mobility status  Activity Tolerance: Patient tolerated treatment well Patient left: in bed;with call bell/phone within reach  OT Visit Diagnosis: Unsteadiness on feet (R26.81);Muscle weakness (generalized) (M62.81);Ataxia, unspecified (R27.0)                Time: 1028-1050 OT Time Calculation (min): 22 min Charges:  OT General  Charges $OT Visit: 1 Visit OT Evaluation $OT Eval Moderate Complexity: 1 Mod  09/22/2023  RP, OTR/L  Acute Rehabilitation Services  Office:  [phone]   Benjamen Brand 09/22/2023, 11:02 AM

## 2023-09-22 NOTE — Progress Notes (Addendum)
 STROKE TEAM PROGRESS NOTE   INTERIM HISTORY/SUBJECTIVE Patient with prior history of strokes and some dysarthria and seizures presented with confusion and speech difficulties.  MRI shows a left thalamic and left periventricular white matter lacunar infarct.  MRA brain shows no large vessel stenosis. He is able to follow midline one-step commands, pretty profound expressive aphasia with dysarthria.  In previous neuroexams he has had some mild dysarthria noted.  He did have follow-up with [facility] in 2023 after his strokes and seizures.  He is not currently on any antiplatelet or antiseizure medications.  Awaiting echo, carotid ultrasound, EEG.  Therapy currently recommending CIR  OBJECTIVE  CBC    Component Value Date/Time   WBC 2.5 (L) 09/21/2023 1927   RBC 2.95 (L) 09/21/2023 1927   HGB 9.2 (L) 09/22/2023 0040   HGB 10.6 (L) 10/14/2022 1016   HCT 27.0 (L) 09/22/2023 0040   HCT 32.2 (L) 10/14/2022 1016   PLT 127 (L) 09/21/2023 1927   PLT 166 10/14/2022 1016   MCV 89.8 09/21/2023 1927   MCV 93 10/14/2022 1016   MCH 29.5 09/21/2023 1927   MCHC 32.8 09/21/2023 1927   RDW 14.6 09/21/2023 1927   RDW 11.9 10/14/2022 1016   LYMPHSABS 0.8 09/21/2023 1927   MONOABS 0.3 09/21/2023 1927   EOSABS 0.1 09/21/2023 1927   BASOSABS 0.0 09/21/2023 1927    BMET    Component Value Date/Time   NA 142 09/22/2023 0040   NA 140 10/14/2022 1016   K 4.3 09/22/2023 0040   CL 110 09/22/2023 0040   CO2 19 (L) 09/21/2023 1927   GLUCOSE 133 (H) 09/22/2023 0040   BUN 73 (H) 09/22/2023 0040   BUN 39 (H) 10/14/2022 1016   CREATININE 4.10 (H) 09/22/2023 0040   CREATININE 1.52 (H) 08/28/2020 1427   CALCIUM  8.8 (L) 09/21/2023 1927   EGFR 15.0 07/08/2023 1237   EGFR 27 (L) 10/14/2022 1016   GFRNONAA 17 (L) 09/21/2023 1927   GFRNONAA 45 (L) 08/28/2020 1427    IMAGING past 24 hours MR ANGIO HEAD WO CONTRAST Result Date: 09/22/2023 CLINICAL DATA:  Neuro deficit, concern for stroke. Small acute infarcts in the  left thalamus and left periatrial white matter. EXAM: MRA HEAD WITHOUT CONTRAST TECHNIQUE: Angiographic images of the Circle of Willis were acquired using MRA technique without intravenous contrast. COMPARISON:  Same day MRI head. FINDINGS: Anterior circulation: The intracranial internal carotid arteries are patent. There is mild narrowing of the right ICA at the anterior genu of the cavernous segment likely related to atherosclerosis. The right M1 segment and right MCA bifurcation is patent. Right M2 branches and visualized distal right MCA branches are patent. The left M1 segment and left MCA bifurcation are patent. Left M2 branches and visualized distal left MCA branches are patent. The A1 and A2 segments are patent bilaterally. Visualized distal ACA branches appear patent. Posterior circulation: The visualized intracranial vertebral arteries are patent. Left vertebral artery is dominant. The basilar artery is patent. There is focal mild narrowing of the mid basilar artery. The bilateral PCAs are patent. Superior cerebellar arteries patent bilaterally. The bilateral AICA are visualized proximally. The bilateral PICA are visualized although the vessel origins are not included within the field of view. Anatomic variants: None significant. Other: None. IMPRESSION: Patent intracranial arterial vasculature. Mild stenosis along the anterior genu of the right cavernous ICA likely related to atherosclerosis. Additional mild irregularity and mild narrowing of the mid basilar artery. Electronically Signed   By: Aldine Humphreys.D.  On: 09/22/2023 09:42   MR BRAIN WO CONTRAST Result Date: 09/22/2023 CLINICAL DATA:  Neuro deficit, acute, stroke suspected EXAM: MRI HEAD WITHOUT CONTRAST TECHNIQUE: Multiplanar, multiecho pulse sequences of the brain and surrounding structures were obtained without intravenous contrast. COMPARISON:  CT head June 4, 25. FINDINGS: Brain: Small acute infarcts in the left thalamus and left  periatrial white matter. No significant edema or mass effect. No midline shift. Chronic infarct in the left parietal lobe. Remote perforator infarct in the left basal ganglia. Advanced T2/FLAIR hyperintensities the white matter, compatible with chronic microvascular ischemic disease. No evidence of acute hemorrhage, mass lesion or hydrocephalus. Left midbrain wallerian degeneration. Cerebral atrophy. Chronic microhemorrhages in the left frontal and parietal lobes. Vascular: Major arterial flow voids are maintained skull base. Skull and upper cervical spine: Normal marrow signal. Sinuses/Orbits: Clear sinuses.  No acute orbital findings. IMPRESSION: 1. Small acute infarcts in the left thalamus and left periatrial white matter. 2. Advanced chronic microvascular ischemic disease. Electronically Signed   By: Stevenson Elbe M.D.   On: 09/22/2023 02:05   CT Head Wo Contrast Result Date: 09/21/2023 CLINICAL DATA:  Altered level of consciousness, increasing confusion EXAM: CT HEAD WITHOUT CONTRAST TECHNIQUE: Contiguous axial images were obtained from the base of the skull through the vertex without intravenous contrast. RADIATION DOSE REDUCTION: This exam was performed according to the departmental dose-optimization program which includes automated exposure control, adjustment of the mA and/or kV according to patient size and/or use of iterative reconstruction technique. COMPARISON:  08/13/2022 FINDINGS: Brain: Stable hypodensities within the periventricular white matter and basal ganglia consistent with chronic small vessel ischemic changes. No signs of acute infarct or hemorrhage. Lateral ventricles and midline structures are stable. No acute extra-axial fluid collections. No mass effect. Vascular: No hyperdense vessel or unexpected calcification. Skull: Normal. Negative for fracture or focal lesion. Sinuses/Orbits: No acute finding. Other: None. IMPRESSION: 1. Stable head CT, no acute intracranial process.  Electronically Signed   By: Bobbye Burrow M.D.   On: 09/21/2023 19:50    Vitals:   09/22/23 1315 09/22/23 1330 09/22/23 1345 09/22/23 1418  BP: (!) 179/95 (!) 201/98 (!) 183/89 (!) 178/98  Pulse: (!) 101 96    Resp: 18  14 16   Temp:   98 F (36.7 C) 97.9 F (36.6 C)  TempSrc:   Oral Oral  SpO2: 100% 100%  100%  Weight:      Height:         PHYSICAL EXAM General:  Alert, well-nourished, well-developed patient in no acute distress Psych:  Mood and affect appropriate for situation CV: Regular rate and rhythm on monitor Respiratory:  Regular, unlabored respirations on room air GI: Abdomen soft and nontender   NEURO:  Mental Status: Alert, states name but mostly unintelligible Speech/Language: speech is dysarthric with expressive aphasia.  Follows one step commands  Pseudobulbar state Jaw jerk brisk Cranial Nerves:  II: PERRL. Visual fields full.  III, IV, VI: EOMI. Eyelids elevate symmetrically.  V: Sensation is intact to light touch and symmetrical to face.  VII: Face is symmetrical resting and smiling VIII: hearing intact to voice. IX, X: Palate elevates symmetrically. Phonation is normal.  ZO:[id number] shrug 5/5. XII: tongue is midline without fasciculations. Motor:  Left upper extremity weak, left hand weak Right dorisflexion weakness Right lower and left lower extremities equal at the hip Tone: paratonia generalized.  Increased tone left greater than right Sensation- Intact to light touch bilaterally.  Gait- deferred  ASSESSMENT/PLAN  Mr. Akashdeep Chuba  Marieta Shorten. is a 78 y.o. male with history of EtOH use, prior hx of withdrawal seizure, stroke who was brought in by his nephew for confusion.  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  MRI  Small acute infarcts in the left  thalamus and left periatrial white matter MRA  Patent intracranial arterial vasculature. Mild stenosis along the anterior genu of the right cavernous ICA likely related to atherosclerosis. Additional mild irregularity and mild narrowing of the mid basilar artery. Carotid Doppler  Pending  2D Echo Pending EEG- pending  LDL 73 HgbA1c 5.1 VTE prophylaxis - Lovenox   No antithrombotic prior to admission, now on aspirin  81 mg daily and clopidogrel  75 mg daily for 3 weeks and then ASA 81mg  alone. Therapy recommendations:  CIR Disposition:  Pending l completion of workup and therapy evaluation  Hx of Stroke/TIA January 2022 patient presented to hospital for right-sided weakness and shakiness.  Patient was diagnosed with stroke and completed work-up. Followed up with [facility] in 2023 for both seizure and stroke follow-up  Hx of Seizure  July 2020 patient presented to hospital for witnessed seizure, possibly related to alcohol withdrawal June 2022 patient presented to hospital for syncope and visual scintillations.  Abnormal EEG slowing was noted.  He previously has been on 500 mg twice daily, however he is not currently on anything per his most recent fill history Will spot check EEG since he is not at baseline  Hypertension Home meds: Norvasc , hydralazine , Cozaar  Stable Blood Pressure Goal: BP less than 220/110   Hyperlipidemia Home meds: Atorvastatin  40 mg, resumed in hospital LDL 73, goal < 70 Continue statin at discharge  Other Stroke Risk Factors ETOH use, alcohol level <15, advised to drink no more than 1 drink(s) a day Obesity, Body mass index is 19.67 kg/m., BMI >/= 30 associated with increased stroke risk, recommend weight loss, diet and exercise as appropriate    Hospital day # 0  Patient seen and examined by NP/APP with MD. MD to update note as needed.   Imogene Mana, DNP, FNP-BC [facility] Pager: [phone]  I have personally  obtained history,examined this patient, reviewed notes, independently viewed imaging studies, participated in medical decision making and plan of care.ROS completed by me personally and pertinent positives fully documented  I have made any additions or clarifications directly to the above note. Agree with note above.  Patient with prior history of lacunar stroke with right hemiparesis and history of seizures presents with confusion and speech and language difficulties due to left thalamic and left subcortical lacunar infarcts.  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  Recommend aspirin  and Plavix  for 3 weeks followed by aspirin  alone and aggressive risk factor modification.  Continue ongoing stroke workup.  Mobilize out of bed.  Physical occupational speech therapy consults.  Check EEG given history of seizure activity and is currently not on anticonvulsants.  No family available at the bedside for discussion.  Greater than 50% time during this 50-minute visit was spent on counseling and coordination of care about his multiple lacunar strokes and pseudobulbar sleep and answering questions.  Ardella Beaver, MD Medical Director [facility] Pager: [phone] 09/22/2023 4:43 PM  To contact Stroke Continuity provider, please refer to [email or web address]. After hours, contact General Neurology

## 2023-09-22 NOTE — H&P (Signed)
 Date: 09/22/2023         Patient Name:  Gregory Nunez. MRN: [id number]  DOB: [birth year] Age / Sex: 78 y.o., male   PCP: Malen Scudder, DO         Medical Service: Internal Medicine Teaching Service         Attending Physician: Dr. Driscilla George, MD    First Contact: Dr. Winston Hawking Pager: [phone]  Second Contact: Dr. Rozelle Corning Pager: [phone]                 Chief Concern: Confusion, wandering  History of Present Illness: Gregory Nunez is a 78 year old who comes to the hospital for confusion and wandering.  He has some aphasia so much of the HPI was obtained via collateral sources.  It seems as though he has been more confused of late although some degree of confusion is chronic for him.  He has been wandering from his independent living facility and family is concerned that he can no longer live there as his needs have increased recently.  His speech is worse than usual, he has dysarthria at baseline.  The symptoms seem to have been ongoing for the last 3 to 4 weeks.  During my interview he is able to answer some simple questions and follow simple instructions.  He is not in pain at present.  He tells me his name but I am unable to ascertain if he knows that he is in the hospital.  Apparently earlier this evening he was oriented to [facility] and knew the current president.  Mr. Bady has a history of type 2 diabetes and poorly controlled hypertension.  He has a history of confusion.  He has been seen in the emergency department and in the internal medicine center for confusion within the last month and vascular dementia was thought to be likely diagnosis.  He was referred to the geriatric assessment clinic but has not yet been seen there.  In the emergency department a noncontrasted CT was normal.  An MRI brain showed small acute infarcts in the left thalamus and left periatrial white matter in addition to advanced chronic microvascular disease.  Neurology was consulted and  recommended medicine admission.  ROS No pain No cough No shortness of breath No chest pain  Allergies: Allergies  Allergen Reactions   Penicillins Other (See Comments)    Pt does not remember reaction but states he woke up in the hospital after taking      Past Medical History: Hypertension, diabetes type 2, CKD stage IV, seizures, stroke.  Medications: No current facility-administered medications on file prior to encounter.   Current Outpatient Medications on File Prior to Encounter  Medication Sig Dispense Refill   amLODipine  (NORVASC ) 10 MG tablet Take 1 tablet (10 mg total) by mouth daily.     atorvastatin  (LIPITOR) 40 MG tablet Take 40 mg by mouth daily.     ferrous sulfate  325 (65 FE) MG tablet Take 1 tablet (325 mg total) by mouth daily with breakfast.     hydrALAZINE  (APRESOLINE ) 25 MG tablet Take 1 tablet (25 mg total) by mouth every 8 (eight) hours. 270 tablet 3   losartan  (COZAAR ) 25 MG tablet Take 1 tablet (25 mg total) by mouth daily.     sodium bicarbonate  650 MG tablet Take 1 tablet (650 mg total) by mouth 2 (two) times daily.     [DISCONTINUED] COMBIVENT RESPIMAT 20-100 MCG/ACT AERS respimat Inhale 1 puff into the lungs every 6 (  six) hours as needed for shortness of breath.     [DISCONTINUED] mometasone -formoterol  (DULERA) 100-5 MCG/ACT AERO Inhale 2 puffs into the lungs daily. (Patient not taking: Reported on 08/19/2019) 13 g 1   [DISCONTINUED] sucralfate  (CARAFATE ) 1 g tablet Take 1 tablet (1 g total) by mouth 4 (four) times daily. (Patient not taking: Reported on 08/19/2019) 120 tablet 1   [DISCONTINUED] tiotropium (SPIRIVA  HANDIHALER) 18 MCG inhalation capsule Place 1 capsule (18 mcg total) into inhaler and inhale daily. (Patient not taking: Reported on 08/19/2019) 30 capsule 2   [DISCONTINUED] traZODone  (DESYREL ) 50 MG tablet Take 50 mg by mouth at bedtime as needed for sleep. (Patient not taking: Reported on 10/06/2020)       Surgical History: Reviewed, no  pertinent updates to surgical history.  Family History:  Reviewed, no pertinent updates to family history  Social History:  Lives in an independent living facility.  Has a history of alcohol use disorder.  Physical Exam: Blood pressure (!) 180/110, pulse 74, temperature 98 F (36.7 C), temperature source Oral, resp. rate 13, height 6' (1.829 m), weight 65.8 kg, SpO2 100%.  No distress Oral mucosas moist and pink, no pharyngeal edema or exudates No cervical lymphadenopathy Heart rate and rhythm are normal, radial pulses are strong, no heart murmurs, no lower extremity edema Breathing comfortably on room air, lung sounds are clear Skin is warm and dry Abdomen soft and nontender Alert, oriented to person, speech is somewhat fluent but mostly nonsensical, he answers some simple questions appropriately, follows simple instructions, extraocular movements are normal, pupils are equal and reactive, no facial asymmetry, symmetric palatal elevation, tongue is midline, bilateral upper extremity strength is normal, normal finger-nose testing  EKG:  Sinus rhythm, no new ST segment abnormalities  Labs: Electrolytes okay No significant hyperglycemia Creatinine at baseline Mild hypocalcemia Mild hypoalbuminemia Liver enzymes normal Stable chronic anemia Leukopenia with mild neutropenia, seems stable and chronic UA with rare bacteria and no RBCs or WBCs, normal specific gravity  Images and other studies: Noncontrast CT head is stable MRI brain without contrast shows acute infarcts in left thalamus and left periatrial white matter with chronic severe microvascular changes  Assessment & Plan:  [name]. is a 78 y.o. with history of diabetes, hypertension, and stroke who presents with subacute mental status change characterized by confusion, wandering, and speech changes, found to have acute cerebral infarct.  Principal Problem:   Acute cerebral infarction [facility]) He is currently  stable.  Acute infarct seen on brain MRI affecting left thalamus and left periatrial white matter.  Risk factors include prior history of stroke, poorly controlled hypertension, and diabetes.  Neurology was consulted by the ED, they will see today.  N.p.o. pending swallow screen.  I think it is okay to gradually restart antihypertensives when he can safely swallow.  Will restart statin and start aspirin  plus Plavix  when it is safe.  TTE.  Admit to med telemetry for cardiac monitoring.  PT/OT and SLP for swallowing and cognitive/language evaluation.  Active Problems:   Encephalopathy   Aphasia He is disoriented and has what seems like a mixed receptive and expressive aphasia.  My exam and interview should be interpreted in context, I had just woken him up from sleep early in the morning for my assessment.  As per above the time of onset is difficult to ascertain, the symptoms seem like they are subacute, perhaps over weeks, based on the history obtained from nephew which I am receiving secondhand.  The strokes  may in fact be incidental, not sure.  Agree with lab workup by neurology for folate levels, vitamin B1, B12, TSH, and RVR.  He does not seem infected.  Urine toxicology is normal.  No alcohol on board.  Per neurology will start high-dose thiamine  once those levels been collected.    Seizure disorder (HCC) History of seizure disorder, used to be on Keppra  but has not been for a while.  He does not show any obvious signs of seizure tonight but wonder if it may be worthwhile to check spot EEG.    Hypertension associated with diabetes (HCC) Hypertensive at present, systolic blood pressure around 200.  Start adding back blood pressure medicine once he is deemed safe to swallow.    CKD (chronic kidney disease) stage 4, GFR 15-29 ml/min (HCC) Stable CKD.    Type 2 diabetes No hyperglycemia. Check A1c. In February it was 5.6.  Level of care: Med telemetry Diet: N.p.o. pending swallow screen IVF:  N/A VTE: enoxaparin  (LOVENOX ) injection 30 mg Start: 09/22/23 0615 Code: Full Surrogate: Nicole Barrette.  I attempted to call the emergency contact listed this morning but was unable to get in touch.  Signed: Adria Hopkins MD 09/22/2023, 6:09 AM Pager: [phone]

## 2023-09-22 NOTE — ED Notes (Signed)
 Awaiting thiamine from main pharmacy

## 2023-09-22 NOTE — Progress Notes (Signed)
  Inpatient Rehab Admissions Coordinator :  Per therapy recommendations, patient was screened for CIR candidacy by Jeannetta Millman RN MSN. Noted patient lives alone prior to admit. Noted he does not have 24/7 assist and has aphasia and wandering over the past few weeks. Per chart review, has had SNF stays after hospital admits in the past, due to lack of caregiver supports. Recommend other rehab venues to be explored at this time unless family can provide 24/7 assist after a short rehab stay. Please call me with any questions.  Jeannetta Millman RN MSN Admissions Coordinator [phone]

## 2023-09-22 NOTE — Consult Note (Addendum)
 NEUROLOGY CONSULT NOTE   Date of service: September 22, 2023 Patient Name: Gregory Nunez. MRN:  [id number] DOB:  1945-11-12 Chief Complaint: "disoriented, inability to care for himself" Requesting Provider: Driscilla George, MD  History of Present Illness  Gregory Nunez. is a 78 y.o. male with hx of EtOH use, prior hx of withdrawal seizure, stroke who was brought in by his nephew for confusion. Nephew reports that patient has been at an independent living since his stroke. Patient had quit drinking after his stroke a couple years ago. Nephew last saw patient in July of last year and then again a month ago. Back in July of last year, he seemed fine and normal. However when he saw him a month ago, patient was noted to be confused, disoriented and unable to care for himself. He is unable to form full sentences, hard to understand speech. He has declined further since last month. He has lost weight.  He brought him to the ED for further evaluation. Patient unable to provide any meaningful hx. He can follow commands, he is disoriented to month, year, location. He has been eating TV dinners per nephew.  LKW: unclear Modified rankin score: unclear. IV Thrombolysis: not offered given unclear LKW. EVT: not offered.  NIHSS components Score: Comment  1a Level of Conscious 0[x]  1[]  2[]  3[]      1b LOC Questions 0[]  1[x]  2[]       1c LOC Commands 0[x]  1[]  2[]       2 Best Gaze 0[x]  1[]  2[]       3 Visual 0[x]  1[]  2[]  3[]      4 Facial Palsy 0[x]  1[]  2[]  3[]      5a Motor Arm - left 0[x]  1[]  2[]  3[]  4[]  UN[]    5b Motor Arm - Right 0[x]  1[]  2[]  3[]  4[]  UN[]    6a Motor Leg - Left 0[x]  1[]  2[]  3[]  4[]  UN[]    6b Motor Leg - Right 0[x]  1[]  2[]  3[]  4[]  UN[]    7 Limb Ataxia 0[x]  1[]  2[]  UN[]      8 Sensory 0[x]  1[]  2[]  UN[]      9 Best Language 0[]  1[]  2[x]  3[]      10 Dysarthria 0[]  1[]  2[x]  UN[]      11 Extinct. and Inattention 0[x]  1[]  2[]       TOTAL: 5      ROS  Unable to ascertain due to  encephalopathy.  Past History   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   Patient is approximately 5-week status post bunion correction left foot   Hyperlipidemia    Hypertension    Hypertensive retinopathy    OU   Hypoglycemia 10/22/2020   Malignant neoplasm of prostate (HCC) 01/09/2014   Neuropathy    Pancreatitis    Pneumococcal vaccination administered at current visit 11/10/2020   Prostate cancer (HCC) 12/19/2013   Gleason 4+3=7, volume 31.31 cc   Rhabdomyolysis 04/12/2021   Sciatica  Shortness of breath dyspnea    with exertion     Past Surgical History:  Procedure Laterality Date   BIOPSY  04/16/2018   Procedure: BIOPSY;  Surgeon: Tami Falcon, MD;  Location: [facility];  Service: Endoscopy;;   BIOPSY  05/23/2020   Procedure: BIOPSY;  Surgeon: Lajuan Pila, MD;  Location: [facility];  Service: Gastroenterology;;  EGD and COLON   BIOPSY  12/09/2020   Procedure: BIOPSY;  Surgeon: Lindle Rhea, MD;  Location: [facility] ENDOSCOPY;  Service: Gastroenterology;;   biopsy on throat     hx of    CATARACT EXTRACTION Bilateral    Dr. Donovan Gallant   COLONOSCOPY N/A 05/23/2020   Procedure: COLONOSCOPY;  Surgeon: Lajuan Pila, MD;  Location: [facility]OS[facility];  Service: Gastroenterology;  Laterality: N/A;   ENTEROSCOPY N/A 10/07/2020   Procedure: ENTEROSCOPY;  Surgeon: Kenney Peacemaker, MD;  Location: [facility] ENDOSCOPY;  Service: Endoscopy;  Laterality: N/A;    ESOPHAGOGASTRODUODENOSCOPY Left 04/16/2018   Procedure: ESOPHAGOGASTRODUODENOSCOPY (EGD);  Surgeon: Tami Falcon, MD;  Location: [facility];  Service: Endoscopy;  Laterality: Left;   ESOPHAGOGASTRODUODENOSCOPY (EGD) WITH PROPOFOL  N/A 05/23/2020   Procedure: ESOPHAGOGASTRODUODENOSCOPY (EGD) WITH PROPOFOL ;  Surgeon: Lajuan Pila, MD;  Location: [facility];  Service: Gastroenterology;  Laterality: N/A;   ESOPHAGOGASTRODUODENOSCOPY (EGD) WITH PROPOFOL  N/A 12/09/2020   Procedure: ESOPHAGOGASTRODUODENOSCOPY (EGD) WITH PROPOFOL ;  Surgeon: Lindle Rhea, MD;  Location: [facility] ENDOSCOPY;  Service: Gastroenterology;  Laterality: N/A;   EYE SURGERY     FOOT SURGERY     HOT HEMOSTASIS N/A 04/16/2018   Procedure: HOT HEMOSTASIS (ARGON PLASMA COAGULATION/BICAP);  Surgeon: Tami Falcon, MD;  Location: [facility];  Service: Endoscopy;  Laterality: N/A;   HOT HEMOSTASIS N/A 05/23/2020   Procedure: HOT HEMOSTASIS (ARGON PLASMA COAGULATION/BICAP);  Surgeon: Lajuan Pila, MD;  Location: [facility];  Service: Gastroenterology;  Laterality: N/A;   HOT HEMOSTASIS N/A 12/09/2020   Procedure: HOT HEMOSTASIS (ARGON PLASMA COAGULATION/BICAP);  Surgeon: Lindle Rhea, MD;  Location: [facility] ENDOSCOPY;  Service: Gastroenterology;  Laterality: N/A;   LYMPHADENECTOMY Bilateral 02/27/2014   Procedure: BILATERAL LYMPHADENECTOMY;  Surgeon: Osborn Blaze, MD;  Location: [facility];  Service: Urology;  Laterality: Bilateral;   POLYPECTOMY  05/23/2020   Procedure: POLYPECTOMY;  Surgeon: Lajuan Pila, MD;  Location: [facility] ENDOSCOPY;  Service: Gastroenterology;;   PROSTATE BIOPSY  12/2013   Gleason 4+3=7, volume 31.31 cc   ROBOT ASSISTED LAPAROSCOPIC RADICAL PROSTATECTOMY N/A 02/27/2014   Procedure: ROBOTIC ASSISTED LAPAROSCOPIC RADICAL PROSTATECTOMY WITH INDOCYANINE GREEN DYE;  Surgeon: Osborn Blaze, MD;  Location: [facility];  Service: Urology;  Laterality: N/A;    Family History: Family History  Problem Relation Age of Onset    Heart disease Mother    Heart attack Father 84   Cancer Sister        breast   Colon cancer Neg Hx    Esophageal cancer Neg Hx    Rectal cancer Neg Hx    Stomach cancer Neg Hx     Social History  reports that he has been smoking cigarettes. He has never used smokeless tobacco. He reports that he does not currently use alcohol after a past usage of about 1.0 standard drink of alcohol per week. He reports that he does not currently use drugs after having used the following drugs: Marijuana, Cocaine, and Heroin.  Allergies  Allergen Reactions   Penicillins Other (See Comments)    Pt does not remember reaction but states he woke up in the hospital after taking      Medications  Current Facility-Administered Medications:    enoxaparin  (LOVENOX ) injection 30 mg, 30 mg, Subcutaneous, Q24H, McLendon, Michael, MD, 30 mg at 09/22/23 8119  Current Outpatient Medications:    amLODipine  (NORVASC ) 10 MG tablet, Take 1 tablet (10 mg total) by mouth daily., Disp: , Rfl:    atorvastatin  (LIPITOR) 40 MG tablet, Take 40 mg by mouth daily., Disp: , Rfl:    ferrous sulfate  325 (65 FE) MG tablet, Take 1 tablet (325 mg total) by mouth daily with breakfast., Disp: , Rfl:    hydrALAZINE  (APRESOLINE ) 25 MG tablet, Take 1 tablet (25 mg total) by mouth every 8 (eight) hours., Disp: 270 tablet, Rfl: 3   losartan  (COZAAR ) 25 MG tablet, Take 1 tablet (25 mg total) by mouth daily., Disp: , Rfl:    sodium bicarbonate  650 MG tablet, Take 1 tablet (650 mg total) by mouth 2 (two) times daily., Disp: , Rfl:   Vitals   Vitals:   09/22/23 0400 09/22/23 0415 09/22/23 0445 09/22/23 0530  BP: (!) 204/87 (!) 189/102 (!) 198/93 (!) 180/110  Pulse: 85 91 89 74  Resp: 17 20 17 13   Temp:      TempSrc:      SpO2: 100% 100% 100% 100%  Weight:      Height:        Body mass index is 19.67 kg/m.  Physical Exam   General: Laying comfortably in bed; in no acute distress.  HENT: Normal oropharynx and mucosa. Normal  external appearance of ears and nose.  Neck: Supple, no pain or tenderness  CV: No JVD. No peripheral edema.  Pulmonary: Symmetric Chest rise. Normal respiratory effort.  Abdomen: Soft to touch, non-tender.  Ext: No cyanosis, edema, or deformity  Skin: No rash. Normal palpation of skin.   Musculoskeletal: Normal digits and nails by inspection. No clubbing.   Neurologic Examination  Mental status/Cognition: Alert, oriented to self, but not to place, month and year, poor attention. Thinks 2 + 2 is 2. Poor reasoning skills and executive function. Speech/language: slurred speech, some word finding difficulty althou hard to say if truly out of proportion to his confusion/disorientation. Cranial nerves:   CN II Pupils equal and reactive to light, no VF deficits    CN III,IV,VI EOM intact, no gaze preference or deviation, no nystagmus    CN V normal sensation in V1, V2, and V3 segments bilaterally    CN VII no asymmetry, no nasolabial fold flattening    CN VIII normal hearing to speech    CN IX & X normal palatal elevation, no uvular deviation    CN XI 5/5 head turn and 5/5 shoulder shrug bilaterally    CN XII midline tongue protrusion    Motor:  Muscle bulk: poor, tone normal, pronator drift none tremor none Mvmt Root Nerve  Muscle Right Left Comments  SA C5/6 Ax Deltoid 5 5   EF C5/6 Mc Biceps 5 5   EE C6/7/8 Rad Triceps 5 5   WF C6/7 Med FCR     WE C7/8 PIN ECU     F Ab C8/T1 U ADM/FDI 5 5   HF L1/2/3 Fem Illopsoas 5 5   KE L2/3/4 Fem Quad 5 5   DF L4/5 D Peron Tib Ant 5 5   PF S1/2 Tibial Grc/Sol 5 5    Sensation:  Light touch Intact throughout   Pin prick    Temperature    Vibration   Proprioception    Coordination/Complex Motor:  - Finger to  Nose intact BL - Heel to shin unable to understand how to do despite multiple attempts at demonstrating him. - Rapid alternating movement are normal - Gait: deferred due to disorientation.  Labs/Imaging/Neurodiagnostic studies    CBC:  Recent Labs  Lab 09/21/23 1927 09/22/23 0040  WBC 2.5*  --   NEUTROABS 1.2*  --   HGB 8.7* 9.2*  HCT 26.5* 27.0*  MCV 89.8  --   PLT 127*  --    Basic Metabolic Panel:  Lab Results  Component Value Date   NA 142 09/22/2023   K 4.3 09/22/2023   CO2 19 (L) 09/21/2023   GLUCOSE 133 (H) 09/22/2023   BUN 73 (H) 09/22/2023   CREATININE 4.10 (H) 09/22/2023   CALCIUM  8.8 (L) 09/21/2023   GFRNONAA 17 (L) 09/21/2023   GFRAA 52 (L) 08/28/2020   Lipid Panel:  Lab Results  Component Value Date   LDLCALC 92 08/13/2022   HgbA1c:  Lab Results  Component Value Date   HGBA1C 5.6 06/13/2023   Urine Drug Screen:     Component Value Date/Time   LABOPIA NONE DETECTED 09/22/2023 0245   COCAINSCRNUR NONE DETECTED 09/22/2023 0245   LABBENZ NONE DETECTED 09/22/2023 0245   AMPHETMU NONE DETECTED 09/22/2023 0245   THCU NONE DETECTED 09/22/2023 0245   LABBARB NONE DETECTED 09/22/2023 0245    Alcohol Level     Component Value Date/Time   ETH <15 09/22/2023 0032   INR  Lab Results  Component Value Date   INR 1.1 09/22/2023   APTT  Lab Results  Component Value Date   APTT 34 09/22/2023   AED levels: No results found for: "PHENYTOIN", "ZONISAMIDE", "LAMOTRIGINE", "LEVETIRACETA"  CT Head without contrast(Personally reviewed): CTH was negative for a large hypodensity concerning for a large territory infarct or hyperdensity concerning for an ICH  MRI Brain(Personally reviewed): L thalamic stroke, L periatrial white matter stroke.  Neurodiagnostics rEEG:  pending  ASSESSMENT   Demetrie Borge. is a 78 y.o. male with hx of EtOH use, prior hx of withdrawal seizure, stroke who was brought in by his nephew for disorientation, poor executive function and unable to care for himself at his independent living facility. It is unclear how long this has been going on for. HE was last seen by nephew a year ago. Has been having difficulty with ADLs for atleast a month. Poor  eating habits, eats TV dinners, prior hx of strokes and quit drinking after his strokes. Hx of heavy EtOh use in the past per nephew.  Neuro exam with executive dyfunction, poor attention, slurred speech with no focal deficits.  Differential is broad and considering underlying dementia, potential wernicke's encephalopathy, vascular vs EtOH induced dementia, nutritional deficiency.  Noted strokes are incidental and do not explain his disorientation.  RECOMMENDATIONS  - TSH, Thiamine , B12, B1, folate, RPR, HIV. - Start High dose thiamine , wernicke's protocol once Thiamine  levels have been collected.  - stroke workup as below. - Frequent Neuro checks per stroke unit protocol - Recommend Vascular imaging with MRA head w/o C and Vas US  carotid BL. - Recommend obtaining TTE - Recommend obtaining Lipid panel with LDL - Please start statin if LDL > 70 - Recommend HbA1c to evaluate for diabetes and how well it is controlled. - Antithrombotic - Aspirin  81mg  daily along with plavix  75mg  daily x 21 days, followed by Aspirin  81mg  daily alone. - Recommend DVT ppx - SBP goal - permissive hypertension first 24 h < 220/110. Held home meds.  -  Recommend Telemetry monitoring for arrythmia - Recommend bedside swallow screen prior to PO intake. - Stroke education booklet - Recommend PT/OT/SLP consult - Recommend Urine Tox screen.  - rEEG in addition to stroke workup. ______________________________________________________________________    Signed, Kanika Bungert, MD [facility]

## 2023-09-22 NOTE — ED Notes (Addendum)
 OT at bedside.

## 2023-09-22 NOTE — ED Provider Notes (Signed)
 [facility]-EMERGENCY DEPT [facility] Emergency Department Provider Note MRN:  [id number]  Arrival date & time: 09/22/23     Chief Complaint   Altered Mental Status   History of Present Illness   Gregory Downie. is a 78 y.o. year-old male with a history of stroke presenting to the ED with chief complaint of altered mental status.  Reportedly for the past few weeks patient has been wandering around his care facility, falling frequently.  He lives in an independent living facility but family is convinced that he can no longer live there because his needs are increased.  He is also gotten a lot worse with his speech, no longer able to talk clearly.  About a month ago he was conversing normally.  Patient denies pain or complaints at this time.  Review of Systems  A thorough review of systems was obtained and all systems are negative except as noted in the HPI and PMH.   Patient's Nunez History    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   Patient is approximately 5-week status post bunion correction left foot   Hyperlipidemia    Hypertension    Hypertensive retinopathy    OU   Hypoglycemia 10/22/2020   Malignant neoplasm of prostate (HCC) 01/09/2014   Neuropathy    Pancreatitis    Pneumococcal vaccination administered at current visit 11/10/2020   Prostate cancer (HCC) 12/19/2013   Gleason 4+3=7, volume 31.31 cc   Rhabdomyolysis 04/12/2021    Sciatica    Shortness of breath dyspnea    with exertion     Past Surgical History:  Procedure Laterality Date   BIOPSY  04/16/2018   Procedure: BIOPSY;  Surgeon: Tami Falcon, MD;  Location: [facility];  Service: Endoscopy;;   BIOPSY  05/23/2020   Procedure: BIOPSY;  Surgeon: Lajuan Pila, MD;  Location: [facility];  Service: Gastroenterology;;  EGD and COLON   BIOPSY  12/09/2020   Procedure: BIOPSY;  Surgeon: Lindle Rhea, MD;  Location: [facility] ENDOSCOPY;  Service: Gastroenterology;;   biopsy on throat     hx of    CATARACT EXTRACTION Bilateral    Dr. Donovan Gallant   COLONOSCOPY N/A 05/23/2020   Procedure: COLONOSCOPY;  Surgeon: Lajuan Pila, MD;  Location: [facility];  Service: Gastroenterology;  Laterality: N/A;   ENTEROSCOPY N/A 10/07/2020   Procedure: ENTEROSCOPY;  Surgeon: Kenney Peacemaker, MD;  Location: [facility] ENDOSCOPY;  Service: Endoscopy;  Laterality: N/A;   ESOPHAGOGASTRODUODENOSCOPY Left 04/16/2018   Procedure: ESOPHAGOGASTRODUODENOSCOPY (EGD);  Surgeon: Tami Falcon, MD;  Location: [facility];  Service: Endoscopy;  Laterality: Left;   ESOPHAGOGASTRODUODENOSCOPY (EGD) WITH PROPOFOL  N/A 05/23/2020   Procedure: ESOPHAGOGASTRODUODENOSCOPY (EGD) WITH PROPOFOL ;  Surgeon: Lajuan Pila, MD;  Location: [facility];  Service: Gastroenterology;  Laterality: N/A;   ESOPHAGOGASTRODUODENOSCOPY (EGD) WITH PROPOFOL  N/A 12/09/2020   Procedure: ESOPHAGOGASTRODUODENOSCOPY (EGD) WITH PROPOFOL ;  Surgeon: Lindle Rhea, MD;  Location: [facility] ENDOSCOPY;  Service: Gastroenterology;  Laterality: N/A;  EYE SURGERY     FOOT SURGERY     HOT HEMOSTASIS N/A 04/16/2018   Procedure: HOT HEMOSTASIS (ARGON PLASMA COAGULATION/BICAP);  Surgeon: Tami Falcon, MD;  Location: [facility];  Service: Endoscopy;  Laterality: N/A;   HOT HEMOSTASIS N/A 05/23/2020   Procedure: HOT HEMOSTASIS (ARGON PLASMA COAGULATION/BICAP);  Surgeon: Lajuan Pila, MD;  Location: [facility];  Service: Gastroenterology;   Laterality: N/A;   HOT HEMOSTASIS N/A 12/09/2020   Procedure: HOT HEMOSTASIS (ARGON PLASMA COAGULATION/BICAP);  Surgeon: Lindle Rhea, MD;  Location: [facility] END[facility]COPY;  Service: Gastroenterology;  Laterality: N/A;   LYMPHADENECTOMY Bilateral 02/27/2014   Procedure: BILATERAL LYMPHADENECTOMY;  Surgeon: Osborn Blaze, MD;  Location: [facility];  Service: Urology;  Laterality: Bilateral;   POLYPECTOMY  05/23/2020   Procedure: POLYPECTOMY;  Surgeon: Lajuan Pila, MD;  Location: [facility];  Service: Gastroenterology;;   PROSTATE BIOPSY  12/2013   Gleason 4+3=7, volume 31.31 cc   ROBOT ASSISTED LAPAROSCOPIC RADICAL PROSTATECTOMY N/A 02/27/2014   Procedure: ROBOTIC ASSISTED LAPAROSCOPIC RADICAL PROSTATECTOMY WITH INDOCYANINE GREEN DYE;  Surgeon: Osborn Blaze, MD;  Location: [facility];  Service: Urology;  Laterality: N/A;    Family History  Problem Relation Age of Onset   Heart disease Mother    Heart attack Father 55   Cancer Sister        breast   Colon cancer Neg Hx    Esophageal cancer Neg Hx    Rectal cancer Neg Hx    Stomach cancer Neg Hx     Social History   Socioeconomic History   Marital status: Married    Spouse name: Not on file   Number of children: Not on file   Years of education: Not on file   Highest education level: Not on file  Occupational History   Not on file  Tobacco Use   Smoking status: Every Day    Current packs/day: 0.50    Types: Cigarettes   Smokeless tobacco: Never   Tobacco comments:    1 ppd Wants Patches .  Stopped 1.5 weeks ago   Vaping Use   Vaping status: Never Used  Substance and Sexual Activity   Alcohol use: Not Currently    Alcohol/week: 1.0 standard drink of alcohol    Types: 1 Cans of beer per week   Drug use: Not Currently    Types: Marijuana, Cocaine, Heroin    Comment: past hx approx 30 years ago    Sexual activity: Not on file  Other Topics Concern   Not on file  Social History Narrative   Not on file   Social Drivers of Nunez    Financial Resource Strain: Low Risk  (09/21/2023)   Overall Financial Resource Strain (CARDIA)    Difficulty of Paying Living Expenses: Not hard at all  Food Insecurity: No Food Insecurity (09/21/2023)   Hunger Vital Sign    Worried About Running Out of Food in the Last Year: Never true    Ran Out of Food in the Last Year: Never true  Transportation Needs: No Transportation Needs (09/21/2023)   PRAPARE - Administrator, Civil Service (Medical): No    Lack of Transportation (Non-Medical): No  Physical Activity: Insufficiently Active (09/21/2023)   Exercise Vital Sign    Days of Exercise per Week: 2 days    Minutes of Exercise per Session: 30 min  Stress: No Stress Concern Present (09/21/2023)   Gregory Nunez - Occupational Stress Questionnaire    Feeling of Stress :  Not at all  Social Connections: Socially Isolated (09/21/2023)   Social Connection and Isolation Panel [NHANES]    Frequency of Communication with Friends and Family: Three times a week    Frequency of Social Gatherings with Friends and Family: Once a week    Attends Religious Services: Never    Database administrator or Organizations: No    Attends Banker Meetings: Never    Marital Status: Divorced  Catering manager Violence: Not At Risk (09/21/2023)   Humiliation, Afraid, Rape, and Kick questionnaire    Fear of Current or Ex-Partner: No    Emotionally Abused: No    Physically Abused: No    Sexually Abused: No     Physical Exam   Vitals:   09/22/23 0215 09/22/23 0220  BP: (!) 177/101   Pulse: 92   Resp: 13   Temp:  98 F (36.7 C)  SpO2: 100%     CONSTITUTIONAL: Well-appearing, NAD NEURO/PSYCH:  Alert and oriented x 3, normal and symmetric strength and sensation, normal coordination, significantly impaired speech with expressive aphasia, unable to successfully name objects.  Seems to have normal comprehension, can follow commands easily. EYES:  eyes equal and  reactive ENT/NECK:  no LAD, no JVD CARDIO: Regular rate, well-perfused, normal S1 and S2 PULM:  CTAB no wheezing or rhonchi GI/GU:  non-distended, non-tender MSK/SPINE:  No gross deformities, no edema SKIN:  no rash, atraumatic   *Additional and/or pertinent findings included in MDM below  Diagnostic and Interventional Summary    EKG Interpretation Date/Time:  Wednesday September 21 2023 22:01:03 EDT Ventricular Rate:  73 PR Interval:  160 QRS Duration:  82 QT Interval:  406 QTC Calculation: 447 R Axis:   -5  Text Interpretation: Normal sinus rhythm ST & T wave abnormality, consider inferolateral ischemia Abnormal ECG When compared with ECG of 15-Aug-2023 13:40, PREVIOUS ECG IS PRESENT Confirmed by Gwenetta Lennert [phone]) on 09/22/2023 1:30:53 AM       Labs Reviewed  CBC WITH DIFFERENTIAL/PLATELET - Abnormal; Notable for the following components:      Result Value   WBC 2.5 (*)    RBC 2.95 (*)    Hemoglobin 8.7 (*)    HCT 26.5 (*)    Platelets 127 (*)    Neutro Abs 1.2 (*)    All other components within normal limits  COMPREHENSIVE METABOLIC PANEL WITH GFR - Abnormal; Notable for the following components:   CO2 19 (*)    Glucose, Bld 101 (*)    BUN 73 (*)    Creatinine, Ser 3.63 (*)    Calcium  8.8 (*)    Albumin 3.4 (*)    Alkaline Phosphatase 36 (*)    GFR, Estimated 17 (*)    All other components within normal limits  I-STAT CHEM 8, ED - Abnormal; Notable for the following components:   BUN 73 (*)    Creatinine, Ser 4.10 (*)    Glucose, Bld 133 (*)    TCO2 19 (*)    Hemoglobin 9.2 (*)    HCT 27.0 (*)    All other components within normal limits  ETHANOL  PROTIME-INR  APTT  URINALYSIS, ROUTINE W REFLEX MICROSCOPIC  RAPID URINE DRUG SCREEN, HOSP PERFORMED    MR BRAIN WO CONTRAST  Final Result    CT Head Wo Contrast  Final Result      Medications - No data to display   Procedures  /  Critical Care .Critical Care  Performed by: Gwenetta Lennert  M,  MD Authorized by: Edson Graces, MD   Critical care provider statement:    Critical care time (minutes):  35   Critical care was necessary to treat or prevent imminent or life-threatening deterioration of the following conditions: Acute ischemic stroke.   Critical care was time spent personally by me on the following activities:  Development of treatment plan with patient or surrogate, discussions with consultants, evaluation of patient's response to treatment, examination of patient, ordering and review of laboratory studies, ordering and review of radiographic studies, ordering and performing treatments and interventions, pulse oximetry, re-evaluation of patient's condition and review of old charts   ED Course and Medical Decision Making  Initial Impression and Ddx Concern for subacute ischemic stroke due to the possible expressive aphasia seen on exam.  History of stroke.  Really an unclear last known well, not a lot of history obtained from the facility or from family.  Other considerations include a metabolic process of some kind, stroke recrudescent symptoms in the setting of electrolyte disturbance, UTI, dehydration, excetra.  Significantly hypertensive on arrival but will proceed with permissive hypertension given the concern for stroke.  Past medical/surgical history that increases complexity of ED encounter: Stroke  Interpretation of Diagnostics I personally reviewed the EKG and my interpretation is as follows: Sinus rhythm, nonspecific ST and T wave findings  No significant blood count or electrolyte disturbance.  Elevated BUN compared to prior  Patient Reassessment and Ultimate Disposition/Management     MRI reveals acute ischemic stroke, neurology is consulted, plan is for hospitalist admission.  Patient management required discussion with the following services or consulting groups:  Hospitalist Service and Neurology  Complexity of Problems Addressed Acute illness or injury  that poses threat of life of bodily function  Additional Data Reviewed and Analyzed Further history obtained from: Further history from spouse/family member  Additional Factors Impacting ED Encounter Risk Consideration of hospitalization  Merrick Abe. Harless Lien, MD [facility] Emergency Medicine [facility] N[facility] [email or web address]  Final Clinical Impressions(s) / ED Diagnoses     ICD-10-CM   1. Acute ischemic stroke [facility])  I63.9       ED Discharge Orders     None        Discharge Instructions Discussed with and Provided to Patient:   Discharge Instructions   None      Edson Graces, MD 09/22/23 [phone]

## 2023-09-22 NOTE — Progress Notes (Signed)
 EEG complete - results pending

## 2023-09-22 NOTE — ED Notes (Signed)
 Patient bed cleaned, new brief applied, warm blankets given.

## 2023-09-22 NOTE — Evaluation (Signed)
 Physical Therapy Evaluation Patient Details Name: Gregory Nunez. MRN: [id number] DOB: 05-26-1945 Today's Date: 09/22/2023  History of Present Illness  78 y.o. male adm 09/21/23  with hx of EtOH use, prior hx of withdrawal seizure, stroke who was brought in for confusion.wandering, and aphasia.  Per the chart he has baseline aphaisa and confusion, but family notes it is worse.  MR Brain:Small acute infarcts in the left thalamus and left periatrial  white matter.  Advanced chronic microvascular ischemic disease.  Clinical Impression  Patient seen in ED for limited evaluation between neurology and SLP visit.  Patient reports previously ambulatory with cane at home with history of falls.  Currently needing min A throughout with RW due to poor R side awareness and running into items on R side.  He will benefit from skilled PT in the acute setting and from post-acute inpatient rehab (<3 hours/day) at d/c.          If plan is discharge home, recommend the following: A little help with walking and/or transfers;A little help with bathing/dressing/bathroom;Assistance with feeding;Help with stairs or ramp for entrance;Assist for transportation;Assistance with cooking/housework;Direct supervision/assist for medications management;Supervision due to cognitive status   Can travel by private vehicle   Yes    Equipment Recommendations Rolling walker (2 wheels)  Recommendations for Other Services       Functional Status Assessment Patient has had a recent decline in their functional status and demonstrates the ability to make significant improvements in function in a reasonable and predictable amount of time.     Precautions / Restrictions Precautions Precautions: Fall Recall of Precautions/Restrictions: Impaired Precaution/Restrictions Comments: Watch BP      Mobility  Bed Mobility Overal bed mobility: Needs Assistance Bed Mobility: Supine to Sit, Sit to Supine     Supine to sit:  Supervision, HOB elevated Sit to supine: Supervision   General bed mobility comments: increased time to lift trunk and A for lines    Transfers Overall transfer level: Needs assistance Equipment used: Rolling walker (2 wheels) Transfers: Sit to/from Stand Sit to Stand: Min assist           General transfer comment: assist for balance and lines    Ambulation/Gait Ambulation/Gait assistance: Min assist Gait Distance (Feet): 150 Feet Assistive device: Rolling walker (2 wheels) Gait Pattern/deviations: Step-through pattern, Decreased stride length, Decreased dorsiflexion - left, Shuffle, Drifts right/left       General Gait Details: drifts to R and unable to correct with cues so min A throughout to prevent running into walls on R side, difficulty noticing numberes on R side with limited scanning despite cues  Stairs            Wheelchair Mobility     Tilt Bed    Modified Rankin (Stroke Patients Only) Modified Rankin (Stroke Patients Only) Pre-Morbid Rankin Score: Moderate disability Modified Rankin: Moderately severe disability     Balance Overall balance assessment: Needs assistance   Sitting balance-Leahy Scale: Fair     Standing balance support: Single extremity supported, Bilateral upper extremity supported Standing balance-Leahy Scale: Poor Standing balance comment: UE support needed for balance; min A for pulling up brief in standing with legs braced against EOB                             Pertinent Vitals/Pain Pain Assessment Pain Assessment: No/denies pain    Home Living Family/patient expects to be discharged to:: Other (Comment) (was at  senior living apartment) Living Arrangements: Alone Available Help at Discharge: Family;Available PRN/intermittently Type of Home: Apartment Home Access: Elevator       Home Layout: One level Home Equipment: Grab bars - tub/shower;Cane - single point;Shower seat Additional Comments: No family  present today. Pt did state he walks with a cane and his nephews assist with getting groceries.    Prior Function Prior Level of Function : Patient poor historian/Family not available;History of Falls (last six months)             Mobility Comments: using cane PTA, has falls at home ADLs Comments: Does not drive, nephew drives him to get groceries/food; does not cook, warms up frozen meals or picks up meals; shower seat.     Extremity/Trunk Assessment   Upper Extremity Assessment Upper Extremity Assessment: Defer to OT evaluation    Lower Extremity Assessment Lower Extremity Assessment: RLE deficits/detail RLE Deficits / Details: decreased coordination in function on R though able to lift from bed antigravity    Cervical / Trunk Assessment Cervical / Trunk Assessment: Kyphotic  Communication   Communication Communication: Impaired Factors Affecting Communication: Difficulty expressing self (h/o aphasia)    Cognition Arousal: Alert Behavior During Therapy: WFL for tasks assessed/performed   PT - Cognitive impairments: No family/caregiver present to determine baseline, Attention, Initiation, Problem solving                       PT - Cognition Comments: deficits in language so difficult to determine cognition versus language; following commands with multimodal cues and increased time, difficulty with R side awareness and slow processing Following commands: Impaired Following commands impaired: Follows one step commands inconsistently, Follows one step commands with increased time     Cueing Cueing Techniques: Verbal cues, Gestural cues, Tactile cues     General Comments General comments (skin integrity, edema, etc.): VSS with mobility, SLP in the room and assisting with meal post ambulation; wearing brief initially and replaced due to falling off with ambulation    Exercises     Assessment/Plan    PT Assessment Patient needs continued PT services  PT  Problem List Decreased strength;Decreased balance;Decreased mobility;Decreased coordination       PT Treatment Interventions DME instruction;Gait training;Patient/family education;Functional mobility training;Therapeutic exercise;Therapeutic activities;Balance training    PT Goals (Current goals can be found in the Care Plan section)  Acute Rehab PT Goals Patient Stated Goal: none stated PT Goal Formulation: Patient unable to participate in goal setting Time For Goal Achievement: 10/06/23 Potential to Achieve Goals: Fair    Frequency Min 2X/week     Co-evaluation               AM-PAC PT "6 Clicks" Mobility  Outcome Measure Help needed turning from your back to your side while in a flat bed without using bedrails?: A Little Help needed moving from lying on your back to sitting on the side of a flat bed without using bedrails?: A Little Help needed moving to and from a bed to a chair (including a wheelchair)?: A Little Help needed standing up from a chair using your arms (e.g., wheelchair or bedside chair)?: A Little Help needed to walk in hospital room?: A Little Help needed climbing 3-5 steps with a railing? : Total 6 Click Score: 16    End of Session Equipment Utilized During Treatment: Gait belt Activity Tolerance: Patient tolerated treatment well Patient left: in bed;with call bell/phone within reach;Other (comment) (SLP present)  PT Visit Diagnosis: Other abnormalities of gait and mobility (R26.89);History of falling (Z91.81);Muscle weakness (generalized) (M62.81);Other symptoms and signs involving the nervous system (R29.898)    Time: 8119-1478 PT Time Calculation (min) (ACUTE ONLY): 19 min   Charges:   PT Evaluation $PT Eval Moderate Complexity: 1 Mod   PT General Charges $$ ACUTE PT VISIT: 1 Visit         Abigail Hoff, PT Acute Rehabilitation Services Office:[phone] 09/22/2023   Marley Simmers 09/22/2023, 5:02 PM

## 2023-09-22 NOTE — Procedures (Signed)
 Patient Name: Gregory Nunez.  MRN: [id number]  Epilepsy Attending: Arleene Lack  Referring Physician/Provider: Adria Hopkins, MD  Date: 09/22/2023 Duration: 26.27 mins  Patient history: 77yo m with ams. EEG to evaluate for seizure  Level of alertness: Awake  AEDs during EEG study: None  Technical aspects: This EEG study was done with scalp electrodes positioned according to the 10-20 International system of electrode placement. Electrical activity was reviewed with band pass filter of 1-70Hz , sensitivity of 7 uV/mm, display speed of 37mm/sec with a 60Hz  notched filter applied as appropriate. EEG data were recorded continuously and digitally stored.  Video monitoring was available and reviewed as appropriate.  Description: The posterior dominant rhythm consists of 7.5 Hz activity of moderate voltage (25-35 uV) seen predominantly in posterior head regions, symmetric and reactive to eye opening and eye closing. EEG showed continuous generalized 5 to 7 Hz theta slowing. Hyperventilation and photic stimulation were not performed.     ABNORMALITY - Continuous slow, generalized  IMPRESSION: This study is suggestive of mild diffuse encephalopathy. No seizures or epileptiform discharges were seen throughout the recording.  Alizzon Dioguardi O Deshaun Schou

## 2023-09-22 NOTE — Progress Notes (Signed)
 HD#0 Subjective:   Summary: Gregory Wiberg. is a 78 y.o. male with PMH of hypertension, type 2 diabetes, CKD stage IV, seizures, stroke presented to ED for confusion and expressive aphasia, admitted for stroke.  Interim history: Patient is evaluated bedside, laying in bed, no apparent distress.  Awake, alert, oriented to self, location.  Not oriented to time or event. Difficulty with understanding speech, has expressive aphasia. Denies any pain or weakness.   Spoke with Gregory Nunez, nephew and POA, reports that confusion started somewhere about 3 weeks ago, patient had difficulty with remembering things and was wandering around.  Reports the patient lives alone and he would receive messages from the residence nearby the patient was wandering around and was having hard time remembering.  Reports that aide comes by daily to give medications in the morning and family drops by in the evening time to give medications.  Reports that patient chronically had expressive aphasia where he would have difficulty with words however it appears to be it worsened about a month ago where patient had difficulty finding words and unable to carry out full conversations.  Patient uses walker and a cane as mobility devices.    Objective:  Vital signs in last 24 hours: Vitals:   09/22/23 0445 09/22/23 0530 09/22/23 0616 09/22/23 0645  BP: (!) 198/93 (!) 180/110  (!) 176/92  Pulse: 89 74  78  Resp: 17 13  14   Temp:   98.1 F (36.7 C)   TempSrc:   Oral   SpO2: 100% 100%  100%  Weight:      Height:       Supplemental O2: Room Air SpO2: 100 %   Physical Exam:  Constitutional: Appears chronically ill, laying in bed, no acute distress HENT: normocephalic atraumatic, mucous membranes moist Eyes: conjunctiva non-erythematous Neck: supple Cardiovascular: Regular rate Pulmonary/Chest: Breathing comfortably Abdominal: Soft, nontender MSK: No lower extremity edema Neurological: Awake, alert, oriented to  self and location.  Not oriented to time and event.  Has expressive aphasia.   - Neuro Exam: Reports that he does not have sensation along the V1 and V2 distribution, though he does wince; did not rotate head R side, unclear whether patient does not want/understand following commands. 5/5 grip strength b/l; 5/5 UE strength b/l; 5/5 LE strength b/l.  Skin: warm and dry  Filed Weights   09/21/23 1841  Weight: 65.8 kg     Intake/Output Summary (Last 24 hours) at 09/22/2023 0943 Last data filed at 09/22/2023 0818 Gross per 24 hour  Intake 17.1 ml  Output --  Net 17.1 ml   Net IO Since Admission: 17.1 mL [09/22/23 0943]  Pertinent Labs:    Latest Ref Rng & Units 09/22/2023   12:40 AM 09/21/2023    7:27 PM 08/15/2023    1:10 PM  CBC  WBC 4.0 - 10.5 K/uL  2.5  2.8   Hemoglobin 13.0 - 17.0 g/dL 9.2  8.7  8.3   Hematocrit 39.0 - 52.0 % 27.0  26.5  26.3   Platelets 150 - 400 K/uL  127  152        Latest Ref Rng & Units 09/22/2023   12:40 AM 09/21/2023    7:27 PM 08/15/2023    1:10 PM  CMP  Glucose 70 - 99 mg/dL 366  440  99   BUN 8 - 23 mg/dL 73  73  47   Creatinine 0.61 - 1.24 mg/dL 3.47  4.25  9.56   Sodium  135 - 145 mmol/L 142  139  137   Potassium 3.5 - 5.1 mmol/L 4.3  3.7  4.1   Chloride 98 - 111 mmol/L 110  108  107   CO2 22 - 32 mmol/L  19  20   Calcium  8.9 - 10.3 mg/dL  8.8  8.8   Total Protein 6.5 - 8.1 g/dL  7.5  7.9   Total Bilirubin 0.0 - 1.2 mg/dL  0.4  0.4   Alkaline Phos 38 - 126 U/L  36  45   AST 15 - 41 U/L  24  25   ALT 0 - 44 U/L  19  19     Imaging: MR BRAIN WO CONTRAST Result Date: 09/22/2023 CLINICAL DATA:  Neuro deficit, acute, stroke suspected EXAM: MRI HEAD WITHOUT CONTRAST TECHNIQUE: Multiplanar, multiecho pulse sequences of the brain and surrounding structures were obtained without intravenous contrast. COMPARISON:  CT head June 4, 25. FINDINGS: Brain: Small acute infarcts in the left thalamus and left periatrial white matter. No significant edema or mass  effect. No midline shift. Chronic infarct in the left parietal lobe. Remote perforator infarct in the left basal ganglia. Advanced T2/FLAIR hyperintensities the white matter, compatible with chronic microvascular ischemic disease. No evidence of acute hemorrhage, mass lesion or hydrocephalus. Left midbrain wallerian degeneration. Cerebral atrophy. Chronic microhemorrhages in the left frontal and parietal lobes. Vascular: Major arterial flow voids are maintained skull base. Skull and upper cervical spine: Normal marrow signal. Sinuses/Orbits: Clear sinuses.  No acute orbital findings. IMPRESSION: 1. Small acute infarcts in the left thalamus and left periatrial white matter. 2. Advanced chronic microvascular ischemic disease. Electronically Signed   By: Stevenson Elbe M.D.   On: 09/22/2023 02:05   CT Head Wo Contrast Result Date: 09/21/2023 CLINICAL DATA:  Altered level of consciousness, increasing confusion EXAM: CT HEAD WITHOUT CONTRAST TECHNIQUE: Contiguous axial images were obtained from the base of the skull through the vertex without intravenous contrast. RADIATION DOSE REDUCTION: This exam was performed according to the departmental dose-optimization program which includes automated exposure control, adjustment of the mA and/or kV according to patient size and/or use of iterative reconstruction technique. COMPARISON:  08/13/2022 FINDINGS: Brain: Stable hypodensities within the periventricular white matter and basal ganglia consistent with chronic small vessel ischemic changes. No signs of acute infarct or hemorrhage. Lateral ventricles and midline structures are stable. No acute extra-axial fluid collections. No mass effect. Vascular: No hyperdense vessel or unexpected calcification. Skull: Normal. Negative for fracture or focal lesion. Sinuses/Orbits: No acute finding. Other: None. IMPRESSION: 1. Stable head CT, no acute intracranial process. Electronically Signed   By: Bobbye Burrow M.D.   On:  09/21/2023 19:50    Assessment/Plan:   Principal Problem:   Acute cerebral infarction [facility]) Active Problems:   Seizure disorder (HCC)   Hypertension associated with diabetes (HCC)   Type 2 diabetes with complication (HCC)   CKD (chronic kidney disease) stage 4, GFR 15-29 ml/min (HCC)   Encephalopathy   Aphasia   Neutropenia (HCC)   Patient Summary: Zayne Draheim. is a 78 y.o. male with PMH of hypertension, type 2 diabetes, CKD stage IV, seizures, stroke presented to ED for confusion and expressive aphasia, admitted for stroke.   Small Acute infarct Left thalamus and Left Periatrial white matter  Acute on chronic expressive aphasia  Presented with 3 weeks of worsening confusion and one month of worsening expressive aphasia. MRB with findings of small acute infarcts in the left thalamus and left periarterial  white matter. MRA head showed patent intracranial arterial vasculature, mild stenosis R cavernous ICA likely related to atherosclerosis, mild irregularity and narrowing of the mid basilar artery. Per evaluation today, AO x 2, neuro exam limited due to confusion. 5/5 strength b/l UE and LE. Unclear if the infarcts are acute as symptoms have been present for over 3 weeks. Neurology is on board:  - Stroke work up: - Cardiac: f/u TTE to evaluate for cardio embolic/ shunting  - Brain: MR brain resulted with findings above.  - Vessels: f/u Vas US  carotid B/L  - Blood: LDL 73, A1c 5.1, INR 1.1, PTT 14.0 - Treatment:  - Passed bedside swallow exam   - Aspirin  81mg  daily + plavix  75mg  daily x 21 days, followed by monotherapy with ASA 81 mg   - Start Lipitor 80 mg daily, goal LDL < 70  - Therapy:  - SLP/PT/OT  - SBP goal:  - Allow permissive HTN first 24 hours, goal <220/110 then slowly normalize  - Will start amlodipine  10 mg today, can consider starting hydralazine  25mg  q8h tonight (passed 24 hours) if SBP > 180   Altered Mental Status  Chronic Encephalopathy  Has history  of heavy EtOH, presented with ~ 4 weeks of worsening confusion and expressive aphasia. On exam, patient has incoherent speech with few words that are understandable. Differentials include but not limited to metabolic vs infectious vs structural vs toxins vs nutritional abnormalities vs wernicke's encephalopathy. Appreciate neurology's recommendations, work up so far:  - No acute metabolic abnormalities - UA negative for nitrites or leukocytes. 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 B1 pending  - EEG pending - Continue IV thiamine  daily  Previous history of seizure Per chart review, patient used to be on Keppra , however last dispense history 11/2021.  No obvious signs of seizure. -EEG pending  AKI on CKD stage IV Serum creatinine 4.10 (3.63) baseline 3.4-3.6; elevated likely due to uncontrolled blood pressures, will monitor and plan on slowly normalizing blood pressure readings. - Avoid nephrotoxic agent, renally dose medications - Strict I's and O's  Type 2 diabetes A1c 5.1, CBG 84 this morning. - No SSI needed for now   Diet: SLP eval needed  IVF: None,None VTE: Enoxaparin  Wounds: None  Code: Full PT/OT recs: Pending  TOC recs: Pending  Family Update: Presley Broker    Dispo: Anticipated discharge to pending in 3 days pending Medical Management .   Signature: Lanney Pitts, D.O.  Internal Medicine Resident, PGY-1 [facility]  9:43 AM, 09/22/2023   Please contact the on call pager after 5 pm and on weekends at [phone].

## 2023-09-23 ENCOUNTER — Inpatient Hospital Stay (HOSPITAL_COMMUNITY)

## 2023-09-23 DIAGNOSIS — I639 Cerebral infarction, unspecified: Secondary | ICD-10-CM

## 2023-09-23 DIAGNOSIS — I10 Essential (primary) hypertension: Secondary | ICD-10-CM

## 2023-09-23 DIAGNOSIS — F1090 Alcohol use, unspecified, uncomplicated: Secondary | ICD-10-CM

## 2023-09-23 DIAGNOSIS — I6389 Other cerebral infarction: Secondary | ICD-10-CM | POA: Diagnosis not present

## 2023-09-23 DIAGNOSIS — N184 Chronic kidney disease, stage 4 (severe): Secondary | ICD-10-CM | POA: Diagnosis not present

## 2023-09-23 DIAGNOSIS — I739 Peripheral vascular disease, unspecified: Secondary | ICD-10-CM

## 2023-09-23 DIAGNOSIS — E1159 Type 2 diabetes mellitus with other circulatory complications: Secondary | ICD-10-CM | POA: Diagnosis not present

## 2023-09-23 DIAGNOSIS — Z8669 Personal history of other diseases of the nervous system and sense organs: Secondary | ICD-10-CM

## 2023-09-23 DIAGNOSIS — E785 Hyperlipidemia, unspecified: Secondary | ICD-10-CM | POA: Diagnosis not present

## 2023-09-23 DIAGNOSIS — I6381 Other cerebral infarction due to occlusion or stenosis of small artery: Secondary | ICD-10-CM | POA: Diagnosis not present

## 2023-09-23 DIAGNOSIS — E1122 Type 2 diabetes mellitus with diabetic chronic kidney disease: Secondary | ICD-10-CM | POA: Diagnosis not present

## 2023-09-23 LAB — ECHOCARDIOGRAM COMPLETE BUBBLE STUDY
AR max vel: 3.14 cm2
AV Peak grad: 5.5 mmHg
Ao pk vel: 1.17 m/s
Area-P 1/2: 4.6 cm2
S' Lateral: 2.6 cm

## 2023-09-23 LAB — BASIC METABOLIC PANEL WITH GFR
Anion gap: 8 (ref 5–15)
BUN: 72 mg/dL — ABNORMAL HIGH (ref 8–23)
CO2: 20 mmol/L — ABNORMAL LOW (ref 22–32)
Calcium: 8.5 mg/dL — ABNORMAL LOW (ref 8.9–10.3)
Chloride: 111 mmol/L (ref 98–111)
Creatinine, Ser: 4.13 mg/dL — ABNORMAL HIGH (ref 0.61–1.24)
GFR, Estimated: 14 mL/min — ABNORMAL LOW (ref >60)
Glucose, Bld: 98 mg/dL (ref 70–99)
Potassium: 3.9 mmol/L (ref 3.5–5.1)
Sodium: 139 mmol/L (ref 135–145)

## 2023-09-23 LAB — GLUCOSE, CAPILLARY: Glucose-Capillary: 94 mg/dL (ref 70–99)

## 2023-09-23 LAB — CBC
HCT: 28 % — ABNORMAL LOW (ref 39.0–52.0)
Hemoglobin: 9 g/dL — ABNORMAL LOW (ref 13.0–17.0)
MCH: 28.4 pg (ref 26.0–34.0)
MCHC: 32.1 g/dL (ref 30.0–36.0)
MCV: 88.3 fL (ref 80.0–100.0)
Platelets: 116 10*3/uL — ABNORMAL LOW (ref 150–400)
RBC: 3.17 MIL/uL — ABNORMAL LOW (ref 4.22–5.81)
RDW: 14.4 % (ref 11.5–15.5)
WBC: 3.3 10*3/uL — ABNORMAL LOW (ref 4.0–10.5)
nRBC: 0 % (ref 0.0–0.2)

## 2023-09-23 MED ORDER — ENSURE PLUS HIGH PROTEIN PO LIQD
237.0000 mL | Freq: Three times a day (TID) | ORAL | Status: DC
  Administered 2023-09-23 – 2023-09-24 (×4): 237 mL via ORAL

## 2023-09-23 MED ORDER — PROSIGHT PO TABS
1.0000 | ORAL_TABLET | Freq: Every day | ORAL | Status: DC
  Administered 2023-09-23 – 2023-09-24 (×2): 1 via ORAL
  Filled 2023-09-23 (×2): qty 1

## 2023-09-23 NOTE — TOC Initial Note (Addendum)
 Transition of Care (TOC) - Initial/Assessment Note    Patient Details  Name: Gregory Nunez. MRN: [id number] Date of Birth: Jul 26, 1945  Transition of Care [facility]) CM/SW Contact:    Tandy Fam, LCSW Phone Number: 09/23/2023, 11:03 AM  Clinical Narrative:       CSW spoke with patient's nephew, [name], to discuss recommendation for SNF. [name] in agreement, requesting [facility] as patient as has been there in the past. CSW completed referral, awaiting responses. CSW to follow.       UPDATE: [facility] is able to offer a bed, will have a bed available tomorrow if insurance is approved. CSW requested CMA to initiate insurance authorization. CSW to follow.      Expected Discharge Plan: Skilled Nursing Facility Barriers to Discharge: Continued Medical Work up, English as a second language teacher   Patient Goals and CMS Choice Patient states their goals for this hospitalization and ongoing recovery are:: patient unable to participate in goal setting, not fully oriented CMS Medicare.gov Compare Post Acute Care list provided to:: Patient Represenative (must comment) Choice offered to / list presented to :  Darlin Ehrlich)  ownership interest in [facility].provided to::  Immunologist)    Expected Discharge Plan and Services     Post Acute Care Choice: Skilled Nursing Facility Living arrangements for the past 2 months: Apartment                                      Prior Living Arrangements/Services Living arrangements for the past 2 months: Apartment Lives with:: Self Patient language and need for interpreter reviewed:: No Do you feel safe going back to the place where you live?: Yes      Need for Family Participation in Patient Care: Yes (Comment) Care giver support system in place?: No (comment)   Criminal Activity/Legal Involvement Pertinent to Current Situation/Hospitalization: No - Comment as needed  Activities of Daily Living   ADL  Screening (condition at time of admission) Independently performs ADLs?: No Does the patient have a NEW difficulty with bathing/dressing/toileting/self-feeding that is expected to last >3 days?: Yes (Initiates electronic notice to provider for possible OT consult) Does the patient have a NEW difficulty with getting in/out of bed, walking, or climbing stairs that is expected to last >3 days?: Yes (Initiates electronic notice to provider for possible PT consult) Does the patient have a NEW difficulty with communication that is expected to last >3 days?: Yes (Initiates electronic notice to provider for possible SLP consult) Is the patient deaf or have difficulty hearing?: No Does the patient have difficulty seeing, even when wearing glasses/contacts?: No Does the patient have difficulty concentrating, remembering, or making decisions?: Yes  Permission Sought/Granted Permission sought to share information with : Facility Medical sales representative, Family Supports Permission granted to share information with : Yes, Verbal Permission Granted  Share Information with NAME: [name]  Permission granted to share info w AGENCY: SNF  Permission granted to share info w Relationship: Nephew     Emotional Assessment Appearance:: Appears stated age Attitude/Demeanor/Rapport: Unable to Assess Affect (typically observed): Unable to Assess Orientation: : Oriented to Self Alcohol / Substance Use: Not Applicable Psych Involvement: No (comment)  Admission diagnosis:  Acute ischemic stroke [facility]) [I63.9] Acute cerebral infarction [facility]) [I63.9] Patient Active Problem List   Diagnosis Date Noted   Acute cerebral infarction (HCC) 09/22/2023   Encephalopathy 09/22/2023   Aphasia 09/22/2023   Neutropenia (  HCC) 09/22/2023   Chronic kidney disease (CKD), stage IV (severe) (HCC) 09/22/2023   Essential hypertension 09/22/2023   Confusion with non-focal neuro exam 08/17/2023   Chronic active hepatitis C (HCC)  11/03/2022   History of epistaxis 12/04/2021   Debility 04/15/2021   History of alcohol abuse 04/13/2021   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/12/2021   Weakness of both legs    Adenomatous duodenal polyp    AVM (arteriovenous malformation) of duodenum, acquired with hemorrhage    Angiodysplasia of intestine    Iron  deficiency anemia 05/22/2020   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 (HCC)    History of CVA (cerebrovascular accident) 04/23/2020   Hyperlipidemia associated with type 2 diabetes mellitus (HCC) 04/23/2020   CKD (chronic kidney disease) stage 4, GFR 15-29 ml/min (HCC) 04/23/2020   Tobacco use 04/23/2020   Seizure disorder (HCC) 11/08/2018   Hypertension associated with diabetes (HCC) 11/08/2018   Type 2 diabetes with complication (HCC) 11/08/2018   Prostate cancer (HCC) 02/27/2014   PCP:  Malen Scudder, DO Pharmacy:   [id number] - [place], [place] - [address] [place] [address] Phone: [phone] Fax: [phone]     Social Drivers of Health (SDOH) Social History: SDOH Screenings   Food Insecurity: No Food Insecurity (09/21/2023)  Housing: Low Risk  (09/21/2023)  Transportation Needs: No Transportation Needs (09/21/2023)  Utilities: Not At Risk (09/21/2023)  Alcohol Screen: Low Risk  (09/21/2023)  Depression (PHQ2-9): Low Risk  (11/03/2022)  Financial Resource Strain: Low Risk  (09/21/2023)  Physical Activity: Insufficiently Active (09/21/2023)  Social Connections: Socially Isolated (09/21/2023)  Stress: No Stress Concern Present (09/21/2023)  Tobacco Use: High Risk (09/21/2023)  Health Literacy: Adequate Health Literacy (11/03/2022)   SDOH Interventions:     Readmission Risk Interventions    06/20/2023    2:55 PM 06/15/2023    3:00 PM  Readmission Risk Prevention Plan  Post Dischage Appt  Complete  Medication Screening  Complete  Transportation Screening Complete Complete  PCP or Specialist Appt within 5-7 Days  Complete   Home Care Screening Complete   Medication Review (RN CM) Complete

## 2023-09-23 NOTE — Progress Notes (Signed)
 Echocardiogram 2D Echocardiogram has been performed.  Gregory Nunez 09/23/2023, 12:01 PM

## 2023-09-23 NOTE — Progress Notes (Signed)
 Carotid duplex has been completed.   Results can be found under chart review under CV PROC. 09/23/2023 12:49 PM Kian Ottaviano RVT, RDMS

## 2023-09-23 NOTE — TOC CAGE-AID Note (Signed)
 Transition of Care [facility]) - CAGE-AID Screening   Patient Details  Name: Gregory Nunez. MRN: [id number] Date of Birth: 03/27/1946  Transition of Care [facility]) CM/SW Contact:    Randell Bussing, LCSW Phone Number: 09/23/2023, 9:17 AM   Clinical Narrative:    CAGE-AID Screening: Substance Abuse Screening unable to be completed due to: : Patient unable to participate

## 2023-09-23 NOTE — NC FL2 (Signed)
 [place]  MEDICAID FL2 LEVEL OF CARE FORM     IDENTIFICATION  Patient Name: Gregory Nunez. Birthdate: [birth year] Sex: male Admission Date (Current Location): 09/21/2023  [facility] and [place] Number:  Producer, television/film/video and Address:  [facility], [address], [place], [place] [address]      Provider Number: [id number]  Attending Physician Name and Address:  Driscilla George, MD  Relative Name and Phone Number:       Current Level of Care: Hospital Recommended Level of Care: Skilled Nursing Facility Prior Approval Number:    Date Approved/Denied:   PASRR Number: [id number]  Discharge Plan: SNF    Current Diagnoses: Patient Active Problem List   Diagnosis Date Noted   Acute cerebral infarction (HCC) 09/22/2023   Encephalopathy 09/22/2023   Aphasia 09/22/2023   Neutropenia (HCC) 09/22/2023   Chronic kidney disease (CKD), stage IV (severe) (HCC) 09/22/2023   Essential hypertension 09/22/2023   Confusion with non-focal neuro exam 08/17/2023   Chronic active hepatitis C (HCC) 11/03/2022   History of epistaxis 12/04/2021   Debility 04/15/2021   History of alcohol abuse 04/13/2021   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/12/2021   Weakness of both legs    Adenomatous duodenal polyp    AVM (arteriovenous malformation) of duodenum, acquired with hemorrhage    Angiodysplasia of intestine    Iron  deficiency anemia 05/22/2020   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 (HCC)    History of CVA (cerebrovascular accident) 04/23/2020   Hyperlipidemia associated with type 2 diabetes mellitus (HCC) 04/23/2020   CKD (chronic kidney disease) stage 4, GFR 15-29 ml/min (HCC) 04/23/2020   Tobacco use 04/23/2020   Seizure disorder (HCC) 11/08/2018   Hypertension associated with diabetes (HCC) 11/08/2018   Type 2 diabetes with complication (HCC) 11/08/2018   Prostate cancer (HCC) 02/27/2014    Orientation RESPIRATION BLADDER  Height & Weight     Self  Normal Continent Weight: 145 lb 1 oz (65.8 kg) Height:  6' (182.9 cm)  BEHAVIORAL SYMPTOMS/MOOD NEUROLOGICAL BOWEL NUTRITION STATUS      Continent Diet (regular)  AMBULATORY STATUS COMMUNICATION OF NEEDS Skin   Limited Assist Verbally Normal                       Personal Care Assistance Level of Assistance  Bathing, Feeding, Dressing Bathing Assistance: Limited assistance Feeding assistance: Limited assistance Dressing Assistance: Limited assistance     Functional Limitations Info  Speech     Speech Info: Impaired (dysarthria)    SPECIAL CARE FACTORS FREQUENCY  PT (By licensed PT), OT (By licensed OT)     PT Frequency: 5x/wk OT Frequency: 5x/wk            Contractures Contractures Info: Not present    Additional Factors Info  Code Status, Allergies Code Status Info: Full Allergies Info: Penicillins           Current Medications (09/23/2023):  This is the current hospital active medication list Current Facility-Administered Medications  Medication Dose Route Frequency Provider Last Rate Last Admin   amLODipine  (NORVASC ) tablet 10 mg  10 mg Oral Daily Tawkaliyar, Roya, DO   10 mg at 09/23/23 0900   aspirin  EC tablet 81 mg  81 mg Oral Daily Imogene Mana, NP   81 mg at 09/23/23 0900   atorvastatin  (LIPITOR) tablet 80 mg  80 mg Oral Daily Tawkaliyar, Roya, DO   80 mg at 09/23/23 0900  clopidogrel  (PLAVIX ) tablet 75 mg  75 mg Oral Daily Imogene Mana, NP   75 mg at 09/23/23 0900   enoxaparin  (LOVENOX ) injection 30 mg  30 mg Subcutaneous Q24H McLendon, Michael, MD   30 mg at 09/23/23 0544   hydrALAZINE  (APRESOLINE ) tablet 25 mg  25 mg Oral Q8H Malen Scudder, DO   25 mg at 09/23/23 0544   losartan  (COZAAR ) tablet 25 mg  25 mg Oral Daily Malen Scudder, DO   25 mg at 09/23/23 0900   thiamine  (VITAMIN B1) 500 mg in sodium chloride  0.9 % 50 mL IVPB  500 mg Intravenous Q8H Khaliqdina, Salman, MD 110 mL/hr at 09/23/23 0548 500 mg at 09/23/23 0548    Followed by   Cecily Cohen ON 09/24/2023] thiamine  (VITAMIN B1) 250 mg in sodium chloride  0.9 % 50 mL IVPB  250 mg Intravenous Daily Khaliqdina, Salman, MD       Followed by   Cecily Cohen ON 09/30/2023] thiamine  (VITAMIN B1) injection 100 mg  100 mg Intravenous Daily Khaliqdina, Salman, MD         Discharge Medications: Please see discharge summary for a list of discharge medications.  Relevant Imaging Results:  Relevant Lab Results:   Additional Information SS#: [id number]  Tandy Fam, LCSW

## 2023-09-23 NOTE — Progress Notes (Signed)
 HD#1 Subjective:   Summary: Gregory Wandell. is a 78 y.o. male with PMH of hypertension, type 2 diabetes, CKD stage IV, seizures, stroke presented to ED for confusion and expressive aphasia, admitted for stroke.  Interim history: Patient is evaluated bedside, denies any chest pain or shortness of breath.  He is alert and oriented to self only.  Still has expressive aphasia, difficulty in understanding words.  Denies any chest pain or shortness of breath.    Objective:  Vital signs in last 24 hours: Vitals:   09/23/23 0021 09/23/23 0357 09/23/23 0754 09/23/23 1247  BP: (!) 164/101 (!) 165/95 (!) 148/84 (!) 157/86  Pulse: 97 97 96 93  Resp: 16 16 18 18   Temp: 98 F (36.7 C) 98 F (36.7 C) 98.4 F (36.9 C) (!) 97.5 F (36.4 C)  TempSrc: Oral Oral Oral Oral  SpO2: 100% 100% 100% 100%  Weight:      Height:       Supplemental O2: Room Air SpO2: 100 %   Physical Exam:  Constitutional: Appears chronically ill, laying in bed, no acute distress HENT: normocephalic atraumatic, mucous membranes moist Eyes: conjunctiva non-erythematous Neck: supple Cardiovascular: Regular rate Pulmonary/Chest: Breathing comfortably Abdominal: Soft, nontender MSK: No lower extremity edema Neurological: Awake, alert, oriented to self.  Not oriented to time, location, event.  Has expressive aphasia.   - Neuro Exam. 5/5 grip strength b/l; 5/5 UE strength b/l; 5/5 LE strength b/l.  Skin: warm and dry  Filed Weights   09/21/23 1841  Weight: 65.8 kg     Intake/Output Summary (Last 24 hours) at 09/23/2023 1333 Last data filed at 09/23/2023 0900 Gross per 24 hour  Intake 210 ml  Output --  Net 210 ml   Net IO Since Admission: 227.1 mL [09/23/23 1333]  Pertinent Labs:    Latest Ref Rng & Units 09/23/2023    3:47 AM 09/22/2023   12:40 AM 09/21/2023    7:27 PM  CBC  WBC 4.0 - 10.5 K/uL 3.3   2.5   Hemoglobin 13.0 - 17.0 g/dL 9.0  9.2  8.7   Hematocrit 39.0 - 52.0 % 28.0  27.0  26.5    Platelets 150 - 400 K/uL 116   127        Latest Ref Rng & Units 09/23/2023    3:47 AM 09/22/2023   12:40 AM 09/21/2023    7:27 PM  CMP  Glucose 70 - 99 mg/dL 98  161  096   BUN 8 - 23 mg/dL 72  73  73   Creatinine 0.61 - 1.24 mg/dL 0.45  4.09  8.11   Sodium 135 - 145 mmol/L 139  142  139   Potassium 3.5 - 5.1 mmol/L 3.9  4.3  3.7   Chloride 98 - 111 mmol/L 111  110  108   CO2 22 - 32 mmol/L 20   19   Calcium  8.9 - 10.3 mg/dL 8.5   8.8   Total Protein 6.5 - 8.1 g/dL   7.5   Total Bilirubin 0.0 - 1.2 mg/dL   0.4   Alkaline Phos 38 - 126 U/L   36   AST 15 - 41 U/L   24   ALT 0 - 44 U/L   19     Imaging: VAS US  CAROTID Result Date: 09/23/2023 Carotid Arterial Duplex Study Patient Name:  Gregory Simkin.  Date of Exam:   09/23/2023 Medical Rec #: [id number]  Accession #:    [id number] Date of Birth: 31-Dec-1945                Patient Gender: M Patient Age:   38 years Exam Location:  [facility] Procedure:      VAS US  CAROTID Referring Phys: PRAMOD SETHI --------------------------------------------------------------------------------  Indications:       CVA. Risk Factors:      Hypertension, hyperlipidemia, Diabetes, current smoker, prior                    CVA. Other Factors:     CKD,. Comparison Study:  No previous exams Performing Technologist: Jody Hill RVT, RDMS  Examination Guidelines: A complete evaluation includes B-mode imaging, spectral Doppler, color Doppler, and power Doppler as needed of all accessible portions of each vessel. Bilateral testing is considered an integral part of a complete examination. Limited examinations for reoccurring indications may be performed as noted.  Right Carotid Findings: +----------+--------+--------+--------+------------------+------------------+           PSV cm/sEDV cm/sStenosisPlaque DescriptionComments           +----------+--------+--------+--------+------------------+------------------+ CCA Prox  108     10                                                    +----------+--------+--------+--------+------------------+------------------+ CCA Distal78      11                                intimal thickening +----------+--------+--------+--------+------------------+------------------+ ICA Prox  42      11              focal and calcific                   +----------+--------+--------+--------+------------------+------------------+ ICA Distal37      10                                                   +----------+--------+--------+--------+------------------+------------------+ ECA       80      0                                                    +----------+--------+--------+--------+------------------+------------------+ +----------+--------+-------+----------------+-------------------+           PSV cm/sEDV cmsDescribe        Arm Pressure (mmHG) +----------+--------+-------+----------------+-------------------+ [id number]            Multiphasic, WNL                    +----------+--------+-------+----------------+-------------------+ +---------+--------+--+--------+-+---------+ VertebralPSV cm/s46EDV cm/s9Antegrade +---------+--------+--+--------+-+---------+  Left Carotid Findings: +----------+--------+--------+--------+---------------------+------------------+           PSV cm/sEDV cm/sStenosisPlaque Description   Comments           +----------+--------+--------+--------+---------------------+------------------+ CCA Prox  111     14                                                      +----------+--------+--------+--------+---------------------+------------------+  CCA Distal86      11                                   intimal thickening +----------+--------+--------+--------+---------------------+------------------+ ICA Prox  32      9               focal and                                                                 heterogenous                             +----------+--------+--------+--------+---------------------+------------------+ ICA Distal29      9                                                       +----------+--------+--------+--------+---------------------+------------------+ ECA       70      0                                                       +----------+--------+--------+--------+---------------------+------------------+ +----------+--------+--------+----------------+-------------------+           PSV cm/sEDV cm/sDescribe        Arm Pressure (mmHG) +----------+--------+--------+----------------+-------------------+ [id number]              Multiphasic, WNL                    +----------+--------+--------+----------------+-------------------+ +---------+--------+--+--------+-+---------+ VertebralPSV cm/s28EDV cm/s6Antegrade +---------+--------+--+--------+-+---------+   Summary: Right Carotid: The extracranial vessels were near-normal with only minimal wall                thickening or plaque. Left Carotid: The extracranial vessels were near-normal with only minimal wall               thickening or plaque. Vertebrals:  Bilateral vertebral arteries demonstrate antegrade flow. Subclavians: Normal flow hemodynamics were seen in bilateral subclavian              arteries. *See table(s) above for measurements and observations.     Preliminary    ECHOCARDIOGRAM COMPLETE BUBBLE STUDY Result Date: 09/23/2023    ECHOCARDIOGRAM REPORT   Patient Name:   Gregory Wegmann. Date of Exam: 09/23/2023 Medical Rec #:  [id number]               Height:       72.0 in Accession #:    [id number]              Weight:       145.1 lb Date of Birth:  [birth year]               BSA:          1.859 m Patient Age:    65 years  BP:           148/84 mmHg Patient Gender: M                       HR:           88 bpm. Exam Location:  Inpatient Procedure: 2D Echo, Cardiac Doppler, Color Doppler and Saline Contrast  Bubble            Study (Both Spectral and Color Flow Doppler were utilized during            procedure). Indications:    Stroke  History:        Patient has prior history of Echocardiogram examinations, most                 recent 04/24/2020. CKD, stage 4; Risk Factors:Hypertension,                 Diabetes, Dyslipidemia and Current Smoker.  Sonographer:    Terrilee Few RCS Referring Phys: [id number] JULIE MACHEN IMPRESSIONS  1. Left ventricular ejection fraction, by estimation, is 60 to 65%. The left ventricle has normal function. The left ventricle has no regional wall motion abnormalities. There is mild left ventricular hypertrophy. Left ventricular diastolic parameters are consistent with Grade I diastolic dysfunction (impaired relaxation).  2. Right ventricular systolic function is normal. The right ventricular size is normal.  3. The mitral valve is abnormal. No evidence of mitral valve regurgitation. No evidence of mitral stenosis.  4. The aortic valve is tricuspid. There is mild calcification of the aortic valve. There is mild thickening of the aortic valve. Aortic valve regurgitation is not visualized. Aortic valve sclerosis is present, with no evidence of aortic valve stenosis.  5. The inferior vena cava is normal in size with greater than 50% respiratory variability, suggesting right atrial pressure of 3 mmHg.  6. Agitated saline contrast bubble study was negative, with no evidence of any interatrial shunt. FINDINGS  Left Ventricle: Left ventricular ejection fraction, by estimation, is 60 to 65%. The left ventricle has normal function. The left ventricle has no regional wall motion abnormalities. Strain was performed and the global longitudinal strain is indeterminate. The left ventricular internal cavity size was normal in size. There is mild left ventricular hypertrophy. Left ventricular diastolic parameters are consistent with Grade I diastolic dysfunction (impaired relaxation). Right Ventricle: The  right ventricular size is normal. No increase in right ventricular wall thickness. Right ventricular systolic function is normal. Left Atrium: Left atrial size was normal in size. Right Atrium: Right atrial size was normal in size. Pericardium: There is no evidence of pericardial effusion. Mitral Valve: The mitral valve is abnormal. There is mild thickening of the mitral valve leaflet(s). There is mild calcification of the mitral valve leaflet(s). No evidence of mitral valve regurgitation. No evidence of mitral valve stenosis. Tricuspid Valve: The tricuspid valve is normal in structure. Tricuspid valve regurgitation is trivial. No evidence of tricuspid stenosis. Aortic Valve: The aortic valve is tricuspid. There is mild calcification of the aortic valve. There is mild thickening of the aortic valve. Aortic valve regurgitation is not visualized. Aortic valve sclerosis is present, with no evidence of aortic valve stenosis. Aortic valve peak gradient measures 5.5 mmHg. Pulmonic Valve: The pulmonic valve was normal in structure. Pulmonic valve regurgitation is mild. No evidence of pulmonic stenosis. Aorta: The aortic root is normal in size and structure. Venous: The inferior vena cava is normal in size with greater than 50%  respiratory variability, suggesting right atrial pressure of 3 mmHg. IAS/Shunts: The interatrial septum appears to be lipomatous. No atrial level shunt detected by color flow Doppler. Agitated saline contrast was given intravenously to evaluate for intracardiac shunting. Agitated saline contrast bubble study was negative,  with no evidence of any interatrial shunt. Additional Comments: 3D was performed not requiring image post processing on an independent workstation and was indeterminate.  LEFT VENTRICLE PLAX 2D LVIDd:         3.80 cm   Diastology LVIDs:         2.60 cm   LV e' medial:    6.31 cm/s LV PW:         1.00 cm   LV E/e' medial:  11.8 LV IVS:        0.90 cm   LV e' lateral:   6.31 cm/s  LVOT diam:     2.20 cm   LV E/e' lateral: 11.8 LV SV:         65 LV SV Index:   35 LVOT Area:     3.80 cm  RIGHT VENTRICLE             IVC RV S prime:     14.00 cm/s  IVC diam: 2.00 cm TAPSE (M-mode): 2.0 cm LEFT ATRIUM             Index        RIGHT ATRIUM           Index LA diam:        3.20 cm 1.72 cm/m   RA Area:     12.60 cm LA Vol (A2C):   42.7 ml 22.97 ml/m  RA Volume:   30.50 ml  16.41 ml/m LA Vol (A4C):   53.5 ml 28.78 ml/m LA Biplane Vol: 49.1 ml 26.42 ml/m  AORTIC VALVE AV Area (Vmax): 3.14 cm AV Vmax:        117.00 cm/s AV Peak Grad:   5.5 mmHg LVOT Vmax:      96.60 cm/s LVOT Vmean:     62.200 cm/s LVOT VTI:       0.172 m  AORTA Ao Root diam: 3.20 cm Ao Asc diam:  3.30 cm MITRAL VALVE MV Area (PHT): 4.60 cm     SHUNTS MV Decel Time: 165 msec     Systemic VTI:  0.17 m MV E velocity: 74.50 cm/s   Systemic Diam: 2.20 cm MV A velocity: 100.00 cm/s MV E/A ratio:  0.74 Janelle Mediate MD Electronically signed by Janelle Mediate MD Signature Date/Time: 09/23/2023/12:40:55 PM    Final    EEG adult Result Date: 09/22/2023 Arleene Lack, MD     09/22/2023  5:25 PM Patient Name: Gregory Leaven Alease Hunter. MRN: [id number] Epilepsy Attending: Arleene Lack Referring Physician/Provider: Adria Hopkins, MD Date: 09/22/2023 Duration: 26.27 mins Patient history: 77yo m with ams. EEG to evaluate for seizure Level of alertness: Awake AEDs during EEG study: None Technical aspects: This EEG study was done with scalp electrodes positioned according to the 10-20 International system of electrode placement. Electrical activity was reviewed with band pass filter of 1-70Hz , sensitivity of 7 uV/mm, display speed of 72mm/sec with a 60Hz  notched filter applied as appropriate. EEG data were recorded continuously and digitally stored.  Video monitoring was available and reviewed as appropriate. Description: The posterior dominant rhythm consists of 7.5 Hz activity of moderate voltage (25-35 uV) seen predominantly in posterior  head regions, symmetric and reactive to eye opening and  eye closing. EEG showed continuous generalized 5 to 7 Hz theta slowing. Hyperventilation and photic stimulation were not performed.   ABNORMALITY - Continuous slow, generalized IMPRESSION: This study is suggestive of mild diffuse encephalopathy. No seizures or epileptiform discharges were seen throughout the recording. Priyanka Suzanne Erps    Assessment/Plan:   Principal Problem:   Acute cerebral infarction [facility]) Active Problems:   Seizure disorder (HCC)   Hypertension associated with diabetes (HCC)   Type 2 diabetes with complication (HCC)   CKD (chronic kidney disease) stage 4, GFR 15-29 ml/min (HCC)   Encephalopathy   Aphasia   Neutropenia (HCC)   Chronic kidney disease (CKD), stage IV (severe) (HCC)   Essential hypertension   Patient Summary: Gregory Doig. is a 78 y.o. male with PMH of hypertension, type 2 diabetes, CKD stage IV, seizures, stroke presented to ED for confusion and expressive aphasia, admitted for stroke.   Small Acute infarct Left thalamus and Left Periatrial white matter  Acute on chronic expressive aphasia  Presented with 3 weeks of worsening confusion and one month of worsening expressive aphasia. MRB with findings of small acute infarcts in the left thalamus and left periarterial white matter. MRA head showed patent intracranial arterial vasculature, mild stenosis R cavernous ICA likely related to atherosclerosis, mild irregularity and narrowing of the mid basilar artery. Per evaluation today, AO x self, neuro exam limited due to confusion. 5/5 strength b/l UE and LE. Unclear if the infarcts are acute as symptoms have been present for over 3 weeks. Neurology is on board:  - Stroke work up: - Cardiac: f/u TTE to evaluate for cardio embolic/ shunting  - Brain: MR brain resulted with findings above.  - Vessels: f/u Vas US  carotid B/L  - Blood: LDL 73, A1c 5.1, INR 1.1, PTT 14.0 - Treatment:  - Passed  bedside swallow exam; SLP recommended regular diet with thin fluid consistency.  Nutritional supplements and multivitamins placed.  - Aspirin  81mg  daily + plavix  75mg  daily x 21 days, followed by monotherapy with ASA 81 mg   - Lipitor 80 mg daily, goal LDL < 70  - Therapy:  - PT/OT-not a CIR candidate, does not have 24 hours social support at the house.   - Medically stable, pending SNF facility - SBP goal:  - Amlodipine  10 mg daily, hydralazine  25 mg every 8 hours.  Plan to slowly normalize blood pressures.  - Continue to hold losartan  today.  Altered Mental Status  Chronic Encephalopathy  Has history of heavy EtOH, presented with ~ 4 weeks of worsening confusion and expressive aphasia. On exam, patient has incoherent speech with few words that are understandable. Differentials include but not limited to metabolic vs infectious vs structural vs toxins vs nutritional abnormalities vs wernicke's encephalopathy. Appreciate neurology's recommendations, work up so far:  - No acute metabolic abnormalities - UA negative for nitrites or leukocytes. 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 B1 pending  - EEG negative for acute seizures.  Does note mild diffuse encephalopathy. - Continue IV thiamine  daily - Start nutritional supplements and multivitamins.  Previous history of seizure Per chart review, patient used to be on Keppra , however last dispense history 11/2021.  No obvious signs of seizure. -EEG negative for acute seizures, does note mild diffuse encephalopathy.  AKI on CKD stage IV Serum creatinine 4.13 (4.10) baseline 3.4-3.6; elevated likely due to uncontrolled blood pressures, will  monitor and plan on slowly normalizing blood pressure readings. - Avoid nephrotoxic agent, renally dose medications - Strict I's and O's  Type 2 diabetes A1c 5.1, CBG 98 this morning. - No SSI needed  for now   Diet: Regular diet, thin fluids IVF: None,None VTE: Enoxaparin  Wounds: None  Code: Full PT/OT recs: SNF facility [facility] recs: SNF facility Family Update: [facility]    Dispo: Anticipated discharge to SNF facility, pending bed placement.  Signature: Lanney Pitts, D.O.  Internal Medicine Resident, PGY-1 [facility]  1:33 PM, 09/23/2023   Please contact the on call pager after 5 pm and on weekends at [phone].

## 2023-09-23 NOTE — Plan of Care (Signed)
   Problem: Activity: Goal: Risk for activity intolerance will decrease Outcome: Progressing

## 2023-09-23 NOTE — Progress Notes (Signed)
 STROKE TEAM PROGRESS NOTE   INTERIM HISTORY/SUBJECTIVE Patient is lying in bed.  Continues to have mild expressive and receptive aphasia.  Follows simple midline and one-step commands.  Able to move all 4 extremities well.  Vital signs stable.  Neurological exam unchanged.  OBJECTIVE  CBC    Component Value Date/Time   WBC 3.3 (L) 09/23/2023 0347   RBC 3.17 (L) 09/23/2023 0347   HGB 9.0 (L) 09/23/2023 0347   HGB 10.6 (L) 10/14/2022 1016   HCT 28.0 (L) 09/23/2023 0347   HCT 32.2 (L) 10/14/2022 1016   PLT 116 (L) 09/23/2023 0347   PLT 166 10/14/2022 1016   MCV 88.3 09/23/2023 0347   MCV 93 10/14/2022 1016   MCH 28.4 09/23/2023 0347   MCHC 32.1 09/23/2023 0347   RDW 14.4 09/23/2023 0347   RDW 11.9 10/14/2022 1016   LYMPHSABS 0.8 09/21/2023 1927   MONOABS 0.3 09/21/2023 1927   EOSABS 0.1 09/21/2023 1927   BASOSABS 0.0 09/21/2023 1927    BMET    Component Value Date/Time   NA 139 09/23/2023 0347   NA 140 10/14/2022 1016   K 3.9 09/23/2023 0347   CL 111 09/23/2023 0347   CO2 20 (L) 09/23/2023 0347   GLUCOSE 98 09/23/2023 0347   BUN 72 (H) 09/23/2023 0347   BUN 39 (H) 10/14/2022 1016   CREATININE 4.13 (H) 09/23/2023 0347   CREATININE 1.52 (H) 08/28/2020 1427   CALCIUM  8.5 (L) 09/23/2023 0347   EGFR 15.0 07/08/2023 1237   EGFR 27 (L) 10/14/2022 1016   GFRNONAA 14 (L) 09/23/2023 0347   GFRNONAA 45 (L) 08/28/2020 1427    IMAGING past 24 hours VAS US  CAROTID Result Date: 09/23/2023 Carotid Arterial Duplex Study Patient Name:  Gregory Nunez.  Date of Exam:   09/23/2023 Medical Rec #: [id number]                Accession #:    [id number] Date of Birth: December 14, 1945                Patient Gender: M Patient Age:   78 years Exam Location:  [facility] Procedure:      VAS US  CAROTID Referring Phys: Ziair Penson --------------------------------------------------------------------------------  Indications:       CVA. Risk Factors:      Hypertension, hyperlipidemia, Diabetes,  current smoker, prior                    CVA. Other Factors:     CKD,. Comparison Study:  No previous exams Performing Technologist: Jody Hill RVT, RDMS  Examination Guidelines: A complete evaluation includes B-mode imaging, spectral Doppler, color Doppler, and power Doppler as needed of all accessible portions of each vessel. Bilateral testing is considered an integral part of a complete examination. Limited examinations for reoccurring indications may be performed as noted.  Right Carotid Findings: +----------+--------+--------+--------+------------------+------------------+           PSV cm/sEDV cm/sStenosisPlaque DescriptionComments           +----------+--------+--------+--------+------------------+------------------+ CCA Prox  108     10                                                   +----------+--------+--------+--------+------------------+------------------+ CCA Distal78      11  intimal thickening +----------+--------+--------+--------+------------------+------------------+ ICA Prox  42      11              focal and calcific                   +----------+--------+--------+--------+------------------+------------------+ ICA Distal37      10                                                   +----------+--------+--------+--------+------------------+------------------+ ECA       80      0                                                    +----------+--------+--------+--------+------------------+------------------+ +----------+--------+-------+----------------+-------------------+           PSV cm/sEDV cmsDescribe        Arm Pressure (mmHG) +----------+--------+-------+----------------+-------------------+ [id number]            Multiphasic, WNL                    +----------+--------+-------+----------------+-------------------+ +---------+--------+--+--------+-+---------+ VertebralPSV cm/s46EDV cm/s9Antegrade  +---------+--------+--+--------+-+---------+  Left Carotid Findings: +----------+--------+--------+--------+---------------------+------------------+           PSV cm/sEDV cm/sStenosisPlaque Description   Comments           +----------+--------+--------+--------+---------------------+------------------+ CCA Prox  111     14                                                      +----------+--------+--------+--------+---------------------+------------------+ CCA Distal86      11                                   intimal thickening +----------+--------+--------+--------+---------------------+------------------+ ICA Prox  32      9               focal and                                                                 heterogenous                            +----------+--------+--------+--------+---------------------+------------------+ ICA Distal29      9                                                       +----------+--------+--------+--------+---------------------+------------------+ ECA       70      0                                                       +----------+--------+--------+--------+---------------------+------------------+ +----------+--------+--------+----------------+-------------------+  PSV cm/sEDV cm/sDescribe        Arm Pressure (mmHG) +----------+--------+--------+----------------+-------------------+ [id number]              Multiphasic, WNL                    +----------+--------+--------+----------------+-------------------+ +---------+--------+--+--------+-+---------+ VertebralPSV cm/s28EDV cm/s6Antegrade +---------+--------+--+--------+-+---------+   Summary: Right Carotid: The extracranial vessels were near-normal with only minimal wall                thickening or plaque. Left Carotid: The extracranial vessels were near-normal with only minimal wall               thickening or plaque. Vertebrals:  Bilateral  vertebral arteries demonstrate antegrade flow. Subclavians: Normal flow hemodynamics were seen in bilateral subclavian              arteries. *See table(s) above for measurements and observations.     Preliminary    ECHOCARDIOGRAM COMPLETE BUBBLE STUDY Result Date: 09/23/2023    ECHOCARDIOGRAM REPORT   Patient Name:   Gregory Nunez. Date of Exam: 09/23/2023 Medical Rec #:  [id number]               Height:       72.0 in Accession #:    [id number]              Weight:       145.1 lb Date of Birth:  04/17/1946               BSA:          1.859 m Patient Age:    77 years                BP:           148/84 mmHg Patient Gender: M                       HR:           88 bpm. Exam Location:  Inpatient Procedure: 2D Echo, Cardiac Doppler, Color Doppler and Saline Contrast Bubble            Study (Both Spectral and Color Flow Doppler were utilized during            procedure). Indications:    Stroke  History:        Patient has prior history of Echocardiogram examinations, most                 recent 04/24/2020. CKD, stage 4; Risk Factors:Hypertension,                 Diabetes, Dyslipidemia and Current Smoker.  Sonographer:    Terrilee Few RCS Referring Phys: [id number] JULIE MACHEN IMPRESSIONS  1. Left ventricular ejection fraction, by estimation, is 60 to 65%. The left ventricle has normal function. The left ventricle has no regional wall motion abnormalities. There is mild left ventricular hypertrophy. Left ventricular diastolic parameters are consistent with Grade I diastolic dysfunction (impaired relaxation).  2. Right ventricular systolic function is normal. The right ventricular size is normal.  3. The mitral valve is abnormal. No evidence of mitral valve regurgitation. No evidence of mitral stenosis.  4. The aortic valve is tricuspid. There is mild calcification of the aortic valve. There is mild thickening of the aortic valve. Aortic valve regurgitation is not visualized. Aortic valve sclerosis is present,  with no evidence of aortic valve stenosis.  5. The inferior  vena cava is normal in size with greater than 50% respiratory variability, suggesting right atrial pressure of 3 mmHg.  6. Agitated saline contrast bubble study was negative, with no evidence of any interatrial shunt. FINDINGS  Left Ventricle: Left ventricular ejection fraction, by estimation, is 60 to 65%. The left ventricle has normal function. The left ventricle has no regional wall motion abnormalities. Strain was performed and the global longitudinal strain is indeterminate. The left ventricular internal cavity size was normal in size. There is mild left ventricular hypertrophy. Left ventricular diastolic parameters are consistent with Grade I diastolic dysfunction (impaired relaxation). Right Ventricle: The right ventricular size is normal. No increase in right ventricular wall thickness. Right ventricular systolic function is normal. Left Atrium: Left atrial size was normal in size. Right Atrium: Right atrial size was normal in size. Pericardium: There is no evidence of pericardial effusion. Mitral Valve: The mitral valve is abnormal. There is mild thickening of the mitral valve leaflet(s). There is mild calcification of the mitral valve leaflet(s). No evidence of mitral valve regurgitation. No evidence of mitral valve stenosis. Tricuspid Valve: The tricuspid valve is normal in structure. Tricuspid valve regurgitation is trivial. No evidence of tricuspid stenosis. Aortic Valve: The aortic valve is tricuspid. There is mild calcification of the aortic valve. There is mild thickening of the aortic valve. Aortic valve regurgitation is not visualized. Aortic valve sclerosis is present, with no evidence of aortic valve stenosis. Aortic valve peak gradient measures 5.5 mmHg. Pulmonic Valve: The pulmonic valve was normal in structure. Pulmonic valve regurgitation is mild. No evidence of pulmonic stenosis. Aorta: The aortic root is normal in size and  structure. Venous: The inferior vena cava is normal in size with greater than 50% respiratory variability, suggesting right atrial pressure of 3 mmHg. IAS/Shunts: The interatrial septum appears to be lipomatous. No atrial level shunt detected by color flow Doppler. Agitated saline contrast was given intravenously to evaluate for intracardiac shunting. Agitated saline contrast bubble study was negative,  with no evidence of any interatrial shunt. Additional Comments: 3D was performed not requiring image post processing on an independent workstation and was indeterminate.  LEFT VENTRICLE PLAX 2D LVIDd:         3.80 cm   Diastology LVIDs:         2.60 cm   LV e' medial:    6.31 cm/s LV PW:         1.00 cm   LV E/e' medial:  11.8 LV IVS:        0.90 cm   LV e' lateral:   6.31 cm/s LVOT diam:     2.20 cm   LV E/e' lateral: 11.8 LV SV:         65 LV SV Index:   35 LVOT Area:     3.80 cm  RIGHT VENTRICLE             IVC RV S prime:     14.00 cm/s  IVC diam: 2.00 cm TAPSE (M-mode): 2.0 cm LEFT ATRIUM             Index        RIGHT ATRIUM           Index LA diam:        3.20 cm 1.72 cm/m   RA Area:     12.60 cm LA Vol (A2C):   42.7 ml 22.97 ml/m  RA Volume:   30.50 ml  16.41 ml/m LA Vol (A4C):  53.5 ml 28.78 ml/m LA Biplane Vol: 49.1 ml 26.42 ml/m  AORTIC VALVE AV Area (Vmax): 3.14 cm AV Vmax:        117.00 cm/s AV Peak Grad:   5.5 mmHg LVOT Vmax:      96.60 cm/s LVOT Vmean:     62.200 cm/s LVOT VTI:       0.172 m  AORTA Ao Root diam: 3.20 cm Ao Asc diam:  3.30 cm MITRAL VALVE MV Area (PHT): 4.60 cm     SHUNTS MV Decel Time: 165 msec     Systemic VTI:  0.17 m MV E velocity: 74.50 cm/s   Systemic Diam: 2.20 cm MV A velocity: 100.00 cm/s MV E/A ratio:  0.74 Janelle Mediate MD Electronically signed by Janelle Mediate MD Signature Date/Time: 09/23/2023/12:40:55 PM    Final    EEG adult Result Date: 09/22/2023 Arleene Lack, MD     09/22/2023  5:25 PM Patient Name: Gregory Nunez. MRN: [id number] Epilepsy Attending:  Arleene Lack Referring Physician/Provider: Adria Hopkins, MD Date: 09/22/2023 Duration: 26.27 mins Patient history: 77yo m with ams. EEG to evaluate for seizure Level of alertness: Awake AEDs during EEG study: None Technical aspects: This EEG study was done with scalp electrodes positioned according to the 10-20 International system of electrode placement. Electrical activity was reviewed with band pass filter of 1-70Hz , sensitivity of 7 uV/mm, display speed of 69mm/sec with a 60Hz  notched filter applied as appropriate. EEG data were recorded continuously and digitally stored.  Video monitoring was available and reviewed as appropriate. Description: The posterior dominant rhythm consists of 7.5 Hz activity of moderate voltage (25-35 uV) seen predominantly in posterior head regions, symmetric and reactive to eye opening and eye closing. EEG showed continuous generalized 5 to 7 Hz theta slowing. Hyperventilation and photic stimulation were not performed.   ABNORMALITY - Continuous slow, generalized IMPRESSION: This study is suggestive of mild diffuse encephalopathy. No seizures or epileptiform discharges were seen throughout the recording. Arleene Lack    Vitals:   09/23/23 0021 09/23/23 0357 09/23/23 0754 09/23/23 1247  BP: (!) 164/101 (!) 165/95 (!) 148/84 (!) 157/86  Pulse: 97 97 96 93  Resp: 16 16 18 18   Temp: 98 F (36.7 C) 98 F (36.7 C) 98.4 F (36.9 C) (!) 97.5 F (36.4 C)  TempSrc: Oral Oral Oral Oral  SpO2: 100% 100% 100% 100%  Weight:      Height:         PHYSICAL EXAM General:  Alert, well-nourished, well-developed patient in no acute distress Psych:  Mood and affect appropriate for situation CV: Regular rate and rhythm on monitor Respiratory:  Regular, unlabored respirations on room air GI: Abdomen soft and nontender   NEURO:  Mental Status: Alert, states name but mostly unintelligible Speech/Language: speech is dysarthric with expressive aphasia.  Follows one step  commands  Pseudobulbar state Jaw jerk brisk Cranial Nerves:  II: PERRL. Visual fields full.  III, IV, VI: EOMI. Eyelids elevate symmetrically.  V: Sensation is intact to light touch and symmetrical to face.  VII: Face is symmetrical resting and smiling VIII: hearing intact to voice. IX, X: Palate elevates symmetrically. Phonation is normal.  OZ:[id number] shrug 5/5. XII: tongue is midline without fasciculations. Motor:  Left upper extremity weak, left hand weak Right dorisflexion weakness Right lower and left lower extremities equal at the hip Tone: paratonia generalized.  Increased tone left greater than right Sensation- Intact to light touch bilaterally.  Gait- deferred  ASSESSMENT/PLAN  Gregory Nunez. is a 78 y.o. male with history of EtOH use, prior hx of withdrawal seizure, stroke who was brought in by his nephew for confusion.  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  MRI  Small acute infarcts in the left thalamus and left periatrial white matter MRA  Patent intracranial arterial vasculature. Mild stenosis along the anterior genu of the right cavernous ICA likely related to atherosclerosis. Additional mild irregularity and mild narrowing of the mid basilar artery. Carotid Doppler bilateral no significant stenosis. 2D Echo ejection fraction 60 to 65%.  Left atrial size normal. EEG-mild diffuse slowing.  No seizure activity. LDL 73 HgbA1c 5.1 VTE prophylaxis - Lovenox   No antithrombotic prior to admission, now on aspirin  81 mg daily and clopidogrel  75 mg daily for 3 weeks and then ASA 81mg  alone. Therapy recommendations:  CIR Disposition:  Pending l completion of workup and therapy evaluation  Hx of Stroke/TIA January 2022 patient presented to hospital for right-sided weakness  and shakiness.  Patient was diagnosed with stroke and completed work-up. Followed up with [facility] in 2023 for both seizure and stroke follow-up  Hx of Seizure  July 2020 patient presented to hospital for witnessed seizure, possibly related to alcohol withdrawal June 2022 patient presented to hospital for syncope and visual scintillations.  Abnormal EEG slowing was noted.  He previously has been on 500 mg twice daily, however he is not currently on anything per his most recent fill history Will spot check EEG since he is not at baseline  Hypertension Home meds: Norvasc , hydralazine , Cozaar  Stable Blood Pressure Goal: BP less than 220/110   Hyperlipidemia Home meds: Atorvastatin  40 mg, resumed in hospital LDL 73, goal < 70 Continue statin at discharge  Other Stroke Risk Factors ETOH use, alcohol level <15, advised to drink no more than 1 drink(s) a day Obesity, Body mass index is 19.67 kg/m., BMI >/= 30 associated with increased stroke risk, recommend weight loss, diet and exercise as appropriate    Hospital day # 1  .  Patient with prior history of lacunar stroke with right hemiparesis and history of seizures presents with confusion and speech and language difficulties due to left thalamic and left subcortical lacunar infarcts.  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  Recommend aspirin  and Plavix  for 3 weeks followed by aspirin  alone and aggressive risk factor modification.  Continue ongoing stroke workup.  Mobilize out of bed.  Physical occupational speech therapy consults.     No family available at the bedside for discussion.  Greater than 50% time during this 35 minute visit was spent on counseling and coordination of care about his multiple lacunar strokes and pseudobulbar sleep and answering questions.  Stroke team will sign off.  Kindly call for questions  Ardella Beaver, MD Medical Director [facility] Pager:  [phone] 09/23/2023 2:17 PM  To contact Stroke Continuity provider, please refer to [email or web address]. After hours, contact General Neurology

## 2023-09-24 LAB — RENAL FUNCTION PANEL
Albumin: 3 g/dL — ABNORMAL LOW (ref 3.5–5.0)
Anion gap: 8 (ref 5–15)
BUN: 74 mg/dL — ABNORMAL HIGH (ref 8–23)
CO2: 17 mmol/L — ABNORMAL LOW (ref 22–32)
Calcium: 8 mg/dL — ABNORMAL LOW (ref 8.9–10.3)
Chloride: 110 mmol/L (ref 98–111)
Creatinine, Ser: 4.3 mg/dL — ABNORMAL HIGH (ref 0.61–1.24)
GFR, Estimated: 14 mL/min — ABNORMAL LOW (ref >60)
Glucose, Bld: 97 mg/dL (ref 70–99)
Phosphorus: 3.8 mg/dL (ref 2.5–4.6)
Potassium: 4 mmol/L (ref 3.5–5.1)
Sodium: 135 mmol/L (ref 135–145)

## 2023-09-24 LAB — CBC
HCT: 26.4 % — ABNORMAL LOW (ref 39.0–52.0)
Hemoglobin: 8.8 g/dL — ABNORMAL LOW (ref 13.0–17.0)
MCH: 29.3 pg (ref 26.0–34.0)
MCHC: 33.3 g/dL (ref 30.0–36.0)
MCV: 88 fL (ref 80.0–100.0)
Platelets: 120 10*3/uL — ABNORMAL LOW (ref 150–400)
RBC: 3 MIL/uL — ABNORMAL LOW (ref 4.22–5.81)
RDW: 14.4 % (ref 11.5–15.5)
WBC: 2.7 10*3/uL — ABNORMAL LOW (ref 4.0–10.5)
nRBC: 0 % (ref 0.0–0.2)

## 2023-09-24 MED ORDER — ATORVASTATIN CALCIUM 80 MG PO TABS: 80.0000 mg | ORAL_TABLET | Freq: Every day | ORAL | Status: AC

## 2023-09-24 MED ORDER — ENSURE PLUS HIGH PROTEIN PO LIQD
237.0000 mL | Freq: Three times a day (TID) | ORAL | Status: DC
Start: 2023-09-24 — End: 2023-10-14

## 2023-09-24 MED ORDER — PROSIGHT PO TABS: 1.0000 | ORAL_TABLET | Freq: Every day | ORAL | Status: DC

## 2023-09-24 MED ORDER — ASPIRIN 81 MG PO TBEC: 81.0000 mg | DELAYED_RELEASE_TABLET | Freq: Every day | ORAL | Status: AC

## 2023-09-24 MED ORDER — CLOPIDOGREL BISULFATE 75 MG PO TABS: 75.0000 mg | ORAL_TABLET | Freq: Every day | ORAL | 0 refills | Status: AC

## 2023-09-24 NOTE — Discharge Instructions (Signed)
 You came to the hospital for confusion, were found to have stroke.   Start taking these new medications: - Take Plavix  75 mg, 1 tablet by mouth daily for 17 days - Take aspirin  81 mg, take 1 tab by mouth daily  I am also sending you home with multivitamins and nutritional supplements.  For your blood pressure: -Take amlodipine  10 mg 1 tab by mouth daily - Take hydralazine , 1 tablet by mouth 3 times a day  Stop the following medication: -Do not take losartan , until you are seen by the primary care doctor  For high cholesterol Take Lipitor 80 mg 1 tablet by mouth daily  Please make sure you schedule an appointment after you come out of the rehab with your primary care doctor.  If you have any of these following symptoms, please call us  or seek care at an emergency department: -Chest Pain -Difficulty Breathing -Worsening abdominal pain -Syncope (passing out) -Drooping of face -Slurred speech -Sudden weakness in your leg or arm -Fever -Chills -blood in the stool -dark black, sticky stool  If you have any questions or concerns, call our clinic at [phone], [phone] or after hours call [phone] and ask for the internal medicine resident on call.  I am glad you are feeling better. It was a pleasure taking care for you. I wish a good recovery and good health!   Dr. Lanney Pitts

## 2023-09-24 NOTE — Progress Notes (Addendum)
 Report given to Nurse Gorden Latino Hurdle at [facility].

## 2023-09-24 NOTE — Plan of Care (Signed)
   Problem: Education: Goal: Knowledge of General Education information will improve Description: Including pain rating scale, medication(s)/side effects and non-pharmacologic comfort measures Outcome: Not Progressing   Problem: Health Behavior/Discharge Planning: Goal: Ability to manage health-related needs will improve Outcome: Not Progressing   Problem: Clinical Measurements: Goal: Ability to maintain clinical measurements within normal limits will improve Outcome: Progressing

## 2023-09-24 NOTE — Progress Notes (Addendum)
 Subjective:   Summary: Gregory Nunez. is a 78 y.o. male with PMH of hypertension, type 2 diabetes, CKD stage IV, seizures, stroke presented to ED for confusion and expressive aphasia, admitted for stroke.   Overnight Events: None   Interim history: Patient is evaluated bedside.  Laying bed, no acute distress.  Denies any chest pain or shortness of breath.  Objective:  Vital signs in last 24 hours: Vitals:   09/24/23 0014 09/24/23 0346 09/24/23 0430 09/24/23 0743  BP: (!) 171/89 (!) 160/82 (!) 159/91 (!) 151/73  Pulse: 95 (!) 119 93 84  Resp: 18 18  17   Temp: 98.7 F (37.1 C) 98.7 F (37.1 C) 98.2 F (36.8 C) 97.9 F (36.6 C)  TempSrc: Oral Oral Oral Oral  SpO2: 100% 100% 99% 100%  Weight:      Height:       Supplemental O2: Room Air SpO2: 100 %   Physical Exam:  Constitutional: Appears chronically ill, laying in bed, no acute distress HENT: normocephalic atraumatic, mucous membranes moist Cardiovascular: Regular rate Pulmonary/Chest: Breathing comfortably Abdominal: Soft, nontender MSK: No lower extremity edema Neurological: Awake, alert, oriented to self only.  Not oriented to time, location or event.  Has expressive aphasia.  Able to lift himself up from laying to sitting position. Skin: warm and dry  Filed Weights   09/21/23 1841  Weight: 65.8 kg     Intake/Output Summary (Last 24 hours) at 09/24/2023 1109 Last data filed at 09/23/2023 2103 Gross per 24 hour  Intake 340 ml  Output --  Net 340 ml   Net IO Since Admission: 567.1 mL [09/24/23 1109]  Pertinent Labs:    Latest Ref Rng & Units 09/24/2023    4:18 AM 09/23/2023    3:47 AM 09/22/2023   12:40 AM  CBC  WBC 4.0 - 10.5 K/uL 2.7  3.3    Hemoglobin 13.0 - 17.0 g/dL 8.8  9.0  9.2   Hematocrit 39.0 - 52.0 % 26.4  28.0  27.0   Platelets 150 - 400 K/uL 120  116         Latest Ref Rng & Units 09/24/2023    4:18 AM 09/23/2023    3:47 AM 09/22/2023   12:40 AM  CMP  Glucose 70 - 99 mg/dL 97  98   161   BUN 8 - 23 mg/dL 74  72  73   Creatinine 0.61 - 1.24 mg/dL 0.96  0.45  4.09   Sodium 135 - 145 mmol/L 135  139  142   Potassium 3.5 - 5.1 mmol/L 4.0  3.9  4.3   Chloride 98 - 111 mmol/L 110  111  110   CO2 22 - 32 mmol/L 17  20    Calcium  8.9 - 10.3 mg/dL 8.0  8.5      Imaging: VAS US  CAROTID Result Date: 09/23/2023 Carotid Arterial Duplex Study Patient Name:  Gregory Nunez.  Date of Exam:   09/23/2023 Medical Rec #: [id number]                Accession #:    [id number] Date of Birth: 1945-07-05                Patient Gender: M Patient Age:   24 years Exam Location:  [facility] Procedure:      VAS US  CAROTID Referring Phys: PRAMOD SETHI --------------------------------------------------------------------------------  Indications:       CVA. Risk Factors:  Hypertension, hyperlipidemia, Diabetes, current smoker, prior                    CVA. Other Factors:     CKD,. Comparison Study:  No previous exams Performing Technologist: Jody Hill RVT, RDMS  Examination Guidelines: A complete evaluation includes B-mode imaging, spectral Doppler, color Doppler, and power Doppler as needed of all accessible portions of each vessel. Bilateral testing is considered an integral part of a complete examination. Limited examinations for reoccurring indications may be performed as noted.  Right Carotid Findings: +----------+--------+--------+--------+------------------+------------------+           PSV cm/sEDV cm/sStenosisPlaque DescriptionComments           +----------+--------+--------+--------+------------------+------------------+ CCA Prox  108     10                                                   +----------+--------+--------+--------+------------------+------------------+ CCA Distal78      11                                intimal thickening +----------+--------+--------+--------+------------------+------------------+ ICA Prox  42      11              focal and  calcific                   +----------+--------+--------+--------+------------------+------------------+ ICA Distal37      10                                                   +----------+--------+--------+--------+------------------+------------------+ ECA       80      0                                                    +----------+--------+--------+--------+------------------+------------------+ +----------+--------+-------+----------------+-------------------+           PSV cm/sEDV cmsDescribe        Arm Pressure (mmHG) +----------+--------+-------+----------------+-------------------+ [id number]            Multiphasic, WNL                    +----------+--------+-------+----------------+-------------------+ +---------+--------+--+--------+-+---------+ VertebralPSV cm/s46EDV cm/s9Antegrade +---------+--------+--+--------+-+---------+  Left Carotid Findings: +----------+--------+--------+--------+---------------------+------------------+           PSV cm/sEDV cm/sStenosisPlaque Description   Comments           +----------+--------+--------+--------+---------------------+------------------+ CCA Prox  111     14                                                      +----------+--------+--------+--------+---------------------+------------------+ CCA Distal86      11                                   intimal thickening +----------+--------+--------+--------+---------------------+------------------+ ICA  Prox  32      9               focal and                                                                 heterogenous                            +----------+--------+--------+--------+---------------------+------------------+ ICA Distal29      9                                                       +----------+--------+--------+--------+---------------------+------------------+ ECA       70      0                                                        +----------+--------+--------+--------+---------------------+------------------+ +----------+--------+--------+----------------+-------------------+           PSV cm/sEDV cm/sDescribe        Arm Pressure (mmHG) +----------+--------+--------+----------------+-------------------+ [id number]              Multiphasic, WNL                    +----------+--------+--------+----------------+-------------------+ +---------+--------+--+--------+-+---------+ VertebralPSV cm/s28EDV cm/s6Antegrade +---------+--------+--+--------+-+---------+   Summary: Right Carotid: The extracranial vessels were near-normal with only minimal wall                thickening or plaque. Left Carotid: The extracranial vessels were near-normal with only minimal wall               thickening or plaque. Vertebrals:  Bilateral vertebral arteries demonstrate antegrade flow. Subclavians: Normal flow hemodynamics were seen in bilateral subclavian              arteries. *See table(s) above for measurements and observations.     Preliminary    ECHOCARDIOGRAM COMPLETE BUBBLE STUDY Result Date: 09/23/2023    ECHOCARDIOGRAM REPORT   Patient Name:   Gregory Nunez. Date of Exam: 09/23/2023 Medical Rec #:  [id number]               Height:       72.0 in Accession #:    [id number]              Weight:       145.1 lb Date of Birth:  12-Jun-1945               BSA:          1.859 m Patient Age:    77 years                BP:           148/84 mmHg Patient Gender: M                       HR:  88 bpm. Exam Location:  Inpatient Procedure: 2D Echo, Cardiac Doppler, Color Doppler and Saline Contrast Bubble            Study (Both Spectral and Color Flow Doppler were utilized during            procedure). Indications:    Stroke  History:        Patient has prior history of Echocardiogram examinations, most                 recent 04/24/2020. CKD, stage 4; Risk Factors:Hypertension,                 Diabetes, Dyslipidemia and  Current Smoker.  Sonographer:    Terrilee Few RCS Referring Phys: [id number] JULIE MACHEN IMPRESSIONS  1. Left ventricular ejection fraction, by estimation, is 60 to 65%. The left ventricle has normal function. The left ventricle has no regional wall motion abnormalities. There is mild left ventricular hypertrophy. Left ventricular diastolic parameters are consistent with Grade I diastolic dysfunction (impaired relaxation).  2. Right ventricular systolic function is normal. The right ventricular size is normal.  3. The mitral valve is abnormal. No evidence of mitral valve regurgitation. No evidence of mitral stenosis.  4. The aortic valve is tricuspid. There is mild calcification of the aortic valve. There is mild thickening of the aortic valve. Aortic valve regurgitation is not visualized. Aortic valve sclerosis is present, with no evidence of aortic valve stenosis.  5. The inferior vena cava is normal in size with greater than 50% respiratory variability, suggesting right atrial pressure of 3 mmHg.  6. Agitated saline contrast bubble study was negative, with no evidence of any interatrial shunt. FINDINGS  Left Ventricle: Left ventricular ejection fraction, by estimation, is 60 to 65%. The left ventricle has normal function. The left ventricle has no regional wall motion abnormalities. Strain was performed and the global longitudinal strain is indeterminate. The left ventricular internal cavity size was normal in size. There is mild left ventricular hypertrophy. Left ventricular diastolic parameters are consistent with Grade I diastolic dysfunction (impaired relaxation). Right Ventricle: The right ventricular size is normal. No increase in right ventricular wall thickness. Right ventricular systolic function is normal. Left Atrium: Left atrial size was normal in size. Right Atrium: Right atrial size was normal in size. Pericardium: There is no evidence of pericardial effusion. Mitral Valve: The mitral valve is  abnormal. There is mild thickening of the mitral valve leaflet(s). There is mild calcification of the mitral valve leaflet(s). No evidence of mitral valve regurgitation. No evidence of mitral valve stenosis. Tricuspid Valve: The tricuspid valve is normal in structure. Tricuspid valve regurgitation is trivial. No evidence of tricuspid stenosis. Aortic Valve: The aortic valve is tricuspid. There is mild calcification of the aortic valve. There is mild thickening of the aortic valve. Aortic valve regurgitation is not visualized. Aortic valve sclerosis is present, with no evidence of aortic valve stenosis. Aortic valve peak gradient measures 5.5 mmHg. Pulmonic Valve: The pulmonic valve was normal in structure. Pulmonic valve regurgitation is mild. No evidence of pulmonic stenosis. Aorta: The aortic root is normal in size and structure. Venous: The inferior vena cava is normal in size with greater than 50% respiratory variability, suggesting right atrial pressure of 3 mmHg. IAS/Shunts: The interatrial septum appears to be lipomatous. No atrial level shunt detected by color flow Doppler. Agitated saline contrast was given intravenously to evaluate for intracardiac shunting. Agitated saline contrast bubble study was negative,  with no evidence of  any interatrial shunt. Additional Comments: 3D was performed not requiring image post processing on an independent workstation and was indeterminate.  LEFT VENTRICLE PLAX 2D LVIDd:         3.80 cm   Diastology LVIDs:         2.60 cm   LV e' medial:    6.31 cm/s LV PW:         1.00 cm   LV E/e' medial:  11.8 LV IVS:        0.90 cm   LV e' lateral:   6.31 cm/s LVOT diam:     2.20 cm   LV E/e' lateral: 11.8 LV SV:         65 LV SV Index:   35 LVOT Area:     3.80 cm  RIGHT VENTRICLE             IVC RV S prime:     14.00 cm/s  IVC diam: 2.00 cm TAPSE (M-mode): 2.0 cm LEFT ATRIUM             Index        RIGHT ATRIUM           Index LA diam:        3.20 cm 1.72 cm/m   RA Area:      12.60 cm LA Vol (A2C):   42.7 ml 22.97 ml/m  RA Volume:   30.50 ml  16.41 ml/m LA Vol (A4C):   53.5 ml 28.78 ml/m LA Biplane Vol: 49.1 ml 26.42 ml/m  AORTIC VALVE AV Area (Vmax): 3.14 cm AV Vmax:        117.00 cm/s AV Peak Grad:   5.5 mmHg LVOT Vmax:      96.60 cm/s LVOT Vmean:     62.200 cm/s LVOT VTI:       0.172 m  AORTA Ao Root diam: 3.20 cm Ao Asc diam:  3.30 cm MITRAL VALVE MV Area (PHT): 4.60 cm     SHUNTS MV Decel Time: 165 msec     Systemic VTI:  0.17 m MV E velocity: 74.50 cm/s   Systemic Diam: 2.20 cm MV A velocity: 100.00 cm/s MV E/A ratio:  0.74 Janelle Mediate MD Electronically signed by Janelle Mediate MD Signature Date/Time: 09/23/2023/12:40:55 PM    Final     Assessment/Plan:   Principal Problem:   Acute cerebral infarction [facility]) Active Problems:   Seizure disorder (HCC)   Hypertension associated with diabetes (HCC)   Type 2 diabetes with complication (HCC)   CKD (chronic kidney disease) stage 4, GFR 15-29 ml/min (HCC)   Encephalopathy   Aphasia   Neutropenia (HCC)   Chronic kidney disease (CKD), stage IV (severe) (HCC)   Essential hypertension   Patient Summary: Gregory Nunez. is a 78 y.o. male with PMH of hypertension, type 2 diabetes, CKD stage IV, seizures, stroke presented to ED for confusion and expressive aphasia, admitted for stroke.     Small Acute infarct Left thalamus and Left Periatrial white matter  Acute on chronic expressive aphasia  Presented with 3 weeks of worsening confusion and one month of worsening expressive aphasia. MRB with findings of small acute infarcts in the left thalamus and left periarterial white matter. MRA head showed patent intracranial arterial vasculature, mild stenosis R cavernous ICA likely related to atherosclerosis, mild irregularity and narrowing of the mid basilar artery. Per evaluation today, AO x self, neuro exam limited due to confusion. 5/5 strength b/l UE and LE. Unclear  if the infarcts are acute as symptoms have been  present for over 3 weeks. Neurology is on board:  - Stroke work up: - Cardiac: TTE negative for shunting, LVEF 60 to 65%, grade 1 diastolic dysfunction, normal RV, no RWMA.  - Brain: MR brain resulted with findings above.  - Vessels: Vas US  carotid B/L with minimal wall thickening or plaque - Blood: LDL 73, A1c 5.1, INR 1.1, PTT 14.0 - Treatment:             - Passed bedside swallow exam; SLP recommended regular diet with thin fluid consistency.  Nutritional supplements and multivitamins placed.             - Aspirin  81mg  daily + plavix  75mg  daily x 21 days, followed by monotherapy with ASA 81 mg              - Lipitor 80 mg daily, goal LDL < 70  - Therapy:             - PT/OT-not a CIR candidate, does not have 24 hours social support at the house.              - Medically stable, pending SNF facility, pending insurance authorization - SBP goal:             - Amlodipine  10 mg daily, hydralazine  25 mg every 8 hours.  Plan to slowly normalize blood pressures.             - Continue to hold losartan  today.   Altered Mental Status  Chronic Encephalopathy  Has history of heavy EtOH, presented with ~ 4 weeks of worsening confusion and expressive aphasia. On exam, patient has incoherent speech with few words that are understandable. Differentials include but not limited to metabolic vs infectious vs structural vs toxins vs nutritional abnormalities vs wernicke's encephalopathy. Appreciate neurology's recommendations, work up so far consistent with structural abnormality. - No acute metabolic abnormalities - UA negative for nitrites or leukocytes. 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 B1 pending  - EEG negative for acute seizures.  Does note mild diffuse encephalopathy. - Continue IV thiamine  daily - Start nutritional supplements and multivitamins.   Previous history of seizure Per chart  review, patient used to be on Keppra , however last dispense history 11/2021.  No obvious signs of seizure. -EEG negative for acute seizures, does note mild diffuse encephalopathy.   AKI on CKD stage IV Serum creatinine 4.30 (4.13) baseline 3.4-3.6; elevated likely due to uncontrolled blood pressures, will monitor and plan on slowly normalizing blood pressure readings. - Avoid nephrotoxic agent, renally dose medications - Strict I's and O's   Type 2 diabetes A1c 5.1, CBG 97 this morning. - No SSI needed for now   Diet: Thin Liquids  IVF: None,None VTE: Enoxaparin  Wounds: None  Code: Full PT/OT recs: SNF for Subacute PT, none. TOC recs: None Family Update: Gregory Nunez, [place]   Dispo: Anticipated discharge to Rehab in 1 days pending Medical management.   Signature: Lanney Pitts, D.O.  Internal Medicine Resident, PGY-1 [facility]  11:09 AM, 09/24/2023   Please contact the on call pager after 5 pm and on weekends at [phone].

## 2023-09-24 NOTE — Discharge Summary (Signed)
 Name: Gregory Nunez. MRN: [id number] DOB: 1946-04-02 78 y.o. PCP: Gregory Scudder, DO  Date of Admission: 09/21/2023  6:32 PM Date of Discharge:  09/24/2023 Attending Physician: Dr. Ancil Balzarine  DISCHARGE DIAGNOSIS:  Primary Problem: Acute cerebral infarction [facility])   Hospital Problems: Principal Problem:   Acute cerebral infarction [facility]) Active Problems:   Seizure disorder (HCC)   Hypertension associated with diabetes (HCC)   Type 2 diabetes with complication (HCC)   CKD (chronic kidney disease) stage 4, GFR 15-29 ml/min (HCC)   Encephalopathy   Aphasia   Neutropenia (HCC)   Chronic kidney disease (CKD), stage IV (severe) (HCC)   Essential hypertension    DISCHARGE MEDICATIONS:   Allergies as of 09/24/2023       Reactions   Penicillins Other (See Comments)   Pt does not remember reaction but states he woke up in the hospital after taking        Medication List     PAUSE taking these medications    losartan  25 MG tablet Wait to take this until your doctor or other care provider tells you to start again. Commonly known as: COZAAR  Take 1 tablet (25 mg total) by mouth daily.       STOP taking these medications    sodium bicarbonate  650 MG tablet   Vitamin D (Ergocalciferol) 1.25 MG (50000 UNIT) Caps capsule Commonly known as: DRISDOL       TAKE these medications    amLODipine  10 MG tablet Commonly known as: NORVASC  Take 1 tablet (10 mg total) by mouth daily.   aspirin  EC 81 MG tablet Take 1 tablet (81 mg total) by mouth daily. Swallow whole. Start taking on: September 25, 2023   atorvastatin  80 MG tablet Commonly known as: LIPITOR Take 1 tablet (80 mg total) by mouth daily. Start taking on: September 25, 2023 What changed:  medication strength how much to take   clopidogrel  75 MG tablet Commonly known as: PLAVIX  Take 1 tablet (75 mg total) by mouth daily for 17 days. Start taking on: September 25, 2023   feeding supplement Liqd Take 237 mLs by mouth 3 (three)  times daily between meals.   ferrous sulfate  325 (65 FE) MG tablet Take 1 tablet (325 mg total) by mouth daily with breakfast.   hydrALAZINE  25 MG tablet Commonly known as: APRESOLINE  Take 1 tablet (25 mg total) by mouth every 8 (eight) hours.   multivitamin Tabs tablet Take 1 tablet by mouth daily. Start taking on: September 25, 2023        DISPOSITION AND FOLLOW-UP:  [name] was discharged from [facility] in Good condition. At the hospital follow up visit please address:  Small Acute infarct Left thalamus and Left Periatrial white matter  Acute on chronic expressive aphasia  Discharged with aspirin  and Plavix  to complete 21 days then monotherapy with aspirin  Discharged on Lipitor 80 mg daily Please make sure patient follows with neurology No motor deficits noted.  Acute on chronic stress aphasia/chronic encephalopathy Likely due to structural abnormalities, no other acute causes notified. Oriented to self.  AKI on CKD stage IV Please check patient's serum creatinine levels Encourage p.o. intake Hypertension: Patient is discharged on hydralazine  25 mg 3 times daily and amlodipine  10 mg daily.  We held losartan  here to allow slow normalization of the blood pressures.  Follow-up Recommendations: Consults: None  Labs: Basic Metabolic Profile and CBC Studies: None  Medications:  New medications: Aspirin  81 milligram, Plavix  75  mg, Lipitor 80 mg Stop Medications: Losartan   Follow-up Appointments:  Contact information for follow-up providers     Gregory Scudder, DO. Schedule an appointment as soon as possible for a visit.   Specialty: Internal Medicine Why: Hospital follow-up Contact information: [address] [place] [address] [phone]              Contact information for after-discharge care     Destination     [facility] Preferred SNF .   Service: Skilled Nursing Contact information: [address] [place]  [phone]                     HOSPITAL COURSE:  Patient Summary: Small Acute infarct Left thalamus and Left Periatrial white matter  Acute on chronic expressive aphasia  Presented with 3 weeks of worsening confusion and one month of worsening expressive aphasia. MRB with findings of small acute infarcts in the left thalamus and left periarterial white matter. MRA head showed patent intracranial arterial vasculature, mild stenosis R cavernous ICA likely related to atherosclerosis, mild irregularity and narrowing of the mid basilar artery. Per evaluation today, AO x self, neuro exam limited due to confusion. 5/5 strength b/l UE and LE. Unclear if the infarcts are acute as symptoms have been present for over 3 weeks. Neurology is on board: Echo negative for shunting.  Ultrasound of the bilateral carotid showed minimal wall thickening or plaque.  LDL 73, A1c 5.1, INR 1.1, PTT 14.0.  Patient had passed bedside swallow, speech recommended regular diet with fluid/thin consistency.  No show supplements multivitamins placed.  Patient was started on aspirin  81 mg and Pepcid 5 mg daily for 21 days then monotherapy with aspirin  alone.  Patient was started on Lipitor 80 mg daily for goal LDL less than 70.  Patient work with PT OT during this hospitalization, was not a CIR candidate due to does not have 24 hours which supported the house.  Recommended SNF facility.  We do allow permissive hypertension for the first 24 hours and then started patient on amlodipine  10 mg and hydralazine  25 mg every 8 hours to slowly normalize blood pressure readings.  We held losartan .  Altered Mental Status  Chronic Encephalopathy  Has history of heavy EtOH, presented with ~ 4 weeks of worsening confusion and expressive aphasia. On exam, patient has incoherent speech with few words that are understandable. Differentials include but not limited to metabolic vs infectious vs  structural vs toxins vs nutritional abnormalities vs wernicke's encephalopathy. Appreciate neurology's recommendations, work up so far negative for acute metabolic abnormalities.  UA was negative for nitrites or leukocytes.  Imaging was notable for advanced chronic microvascular ischemic disease.  UDS negative.  Alcohol less than 15.  B12 383, folate 8.7, TSH 1.04, RPR nonreactive, vitamin B1 pending.  Patient was started on IV thiamine  daily, plan to stop once discharged.  EEG does not note acute seizures, shows mild diffuse encephalopathy.  Patient was started on nutritional supplements and vitamins.  Confusion appears to be chronic, likely worsened with underlying structural disease.   Previous history of seizure Per chart review, patient used to be on Keppra , however last dispense history 11/2021.  No obvious signs of seizure. -EEG negative for acute seizures, does note mild diffuse encephalopathy.   AKI on CKD stage IV Serum creatinine 4.30 (4.13) baseline 3.4-3.6; elevated likely due to uncontrolled blood pressures, will monitor and plan on slowly normalizing blood pressure readings.   Type  2 diabetes A1c 5.1, CBG 98 this morning. - No SSI needed for now    DISCHARGE INSTRUCTIONS:   Discharge Instructions     Diet - low sodium heart healthy   Complete by: As directed    Discharge instructions   Complete by: As directed    You came to the hospital for confusion, were found to have stroke.   Start taking these new medications: - Take Plavix  75 mg, 1 tablet by mouth daily for 17 days - Take aspirin  81 mg, take 1 tab by mouth daily  I am also sending you home with multivitamins and nutritional supplements.  For your blood pressure: -Take amlodipine  10 mg 1 tab by mouth daily - Take hydralazine , 1 tablet by mouth 3 times a day  Stop the following medication: -Do not take losartan , until you are seen by the primary care doctor  For high cholesterol Take Lipitor 80 mg 1 tablet by  mouth daily  Please make sure you schedule an appointment after you come out of the rehab with your primary care doctor.  If you have any of these following symptoms, please call us  or seek care at an emergency department: -Chest Pain -Difficulty Breathing -Worsening abdominal pain -Syncope (passing out) -Drooping of face -Slurred speech -Sudden weakness in your leg or arm -Fever -Chills -blood in the stool -dark black, sticky stool  If you have any questions or concerns, call our clinic at [phone], [phone] or after hours call [phone] and ask for the internal medicine resident on call.  I am glad you are feeling better. It was a pleasure taking care for you. I wish a good recovery and good health!   Dr. Lanney Pitts   Increase activity slowly   Complete by: As directed        SUBJECTIVE:  Patient is evaluated bedside.  Laying bed, no acute distress.  Denies any chest pain or shortness of breath.  Discharge Vitals:   BP 138/76 (BP Location: Left Arm)   Pulse 86   Temp 97.7 F (36.5 C) (Oral)   Resp 18   Ht 6' (1.829 m)   Wt 65.8 kg   SpO2 100%   BMI 19.67 kg/m   OBJECTIVE:  Physical Exam   Constitutional: Appears chronically ill, laying in bed, no acute distress HENT: normocephalic atraumatic, mucous membranes moist Cardiovascular: Regular rate Pulmonary/Chest: Breathing comfortably Abdominal: Soft, nontender MSK: No lower extremity edema Neurological: Awake, alert, oriented to self only.  Not oriented to time, location or event.  Has expressive aphasia.  Able to lift himself up from laying to sitting position. Skin: warm and dry  Pertinent Labs, Studies, and Procedures:     Latest Ref Rng & Units 09/24/2023    4:18 AM 09/23/2023    3:47 AM 09/22/2023   12:40 AM  CBC  WBC 4.0 - 10.5 K/uL 2.7  3.3    Hemoglobin 13.0 - 17.0 g/dL 8.8  9.0  9.2   Hematocrit 39.0 - 52.0 % 26.4  28.0  27.0   Platelets 150 - 400 K/uL 120  116         Latest Ref  Rng & Units 09/24/2023    4:18 AM 09/23/2023    3:47 AM 09/22/2023   12:40 AM  CMP  Glucose 70 - 99 mg/dL 97  98  578   BUN 8 - 23 mg/dL 74  72  73   Creatinine 0.61 - 1.24 mg/dL 4.69  6.29  5.28   Sodium  135 - 145 mmol/L 135  139  142   Potassium 3.5 - 5.1 mmol/L 4.0  3.9  4.3   Chloride 98 - 111 mmol/L 110  111  110   CO2 22 - 32 mmol/L 17  20    Calcium  8.9 - 10.3 mg/dL 8.0  8.5      EEG adult Result Date: 09/22/2023 Arleene Lack, MD     09/22/2023  5:25 PM Patient Name: Gregory Nunez. MRN: [id number] Epilepsy Attending: Arleene Lack Referring Physician/Provider: Adria Hopkins, MD Date: 09/22/2023 Duration: 26.27 mins Patient history: 77yo m with ams. EEG to evaluate for seizure Level of alertness: Awake AEDs during EEG study: None Technical aspects: This EEG study was done with scalp electrodes positioned according to the 10-20 International system of electrode placement. Electrical activity was reviewed with band pass filter of 1-70Hz , sensitivity of 7 uV/mm, display speed of 57mm/sec with a 60Hz  notched filter applied as appropriate. EEG data were recorded continuously and digitally stored.  Video monitoring was available and reviewed as appropriate. Description: The posterior dominant rhythm consists of 7.5 Hz activity of moderate voltage (25-35 uV) seen predominantly in posterior head regions, symmetric and reactive to eye opening and eye closing. EEG showed continuous generalized 5 to 7 Hz theta slowing. Hyperventilation and photic stimulation were not performed.   ABNORMALITY - Continuous slow, generalized IMPRESSION: This study is suggestive of mild diffuse encephalopathy. No seizures or epileptiform discharges were seen throughout the recording. Priyanka Suzanne Erps   MR ANGIO HEAD WO CONTRAST Result Date: 09/22/2023 CLINICAL DATA:  Neuro deficit, concern for stroke. Small acute infarcts in the left thalamus and left periatrial white matter. EXAM: MRA HEAD WITHOUT CONTRAST  TECHNIQUE: Angiographic images of the Circle of Willis were acquired using MRA technique without intravenous contrast. COMPARISON:  Same day MRI head. FINDINGS: Anterior circulation: The intracranial internal carotid arteries are patent. There is mild narrowing of the right ICA at the anterior genu of the cavernous segment likely related to atherosclerosis. The right M1 segment and right MCA bifurcation is patent. Right M2 branches and visualized distal right MCA branches are patent. The left M1 segment and left MCA bifurcation are patent. Left M2 branches and visualized distal left MCA branches are patent. The A1 and A2 segments are patent bilaterally. Visualized distal ACA branches appear patent. Posterior circulation: The visualized intracranial vertebral arteries are patent. Left vertebral artery is dominant. The basilar artery is patent. There is focal mild narrowing of the mid basilar artery. The bilateral PCAs are patent. Superior cerebellar arteries patent bilaterally. The bilateral AICA are visualized proximally. The bilateral PICA are visualized although the vessel origins are not included within the field of view. Anatomic variants: None significant. Other: None. IMPRESSION: Patent intracranial arterial vasculature. Mild stenosis along the anterior genu of the right cavernous ICA likely related to atherosclerosis. Additional mild irregularity and mild narrowing of the mid basilar artery. Electronically Signed   By: Denny Flack M.D.   On: 09/22/2023 09:42   MR BRAIN WO CONTRAST Result Date: 09/22/2023 CLINICAL DATA:  Neuro deficit, acute, stroke suspected EXAM: MRI HEAD WITHOUT CONTRAST TECHNIQUE: Multiplanar, multiecho pulse sequences of the brain and surrounding structures were obtained without intravenous contrast. COMPARISON:  CT head June 4, 25. FINDINGS: Brain: Small acute infarcts in the left thalamus and left periatrial white matter. No significant edema or mass effect. No midline shift.  Chronic infarct in the left parietal lobe. Remote perforator infarct in the left basal ganglia. Advanced  T2/FLAIR hyperintensities the white matter, compatible with chronic microvascular ischemic disease. No evidence of acute hemorrhage, mass lesion or hydrocephalus. Left midbrain wallerian degeneration. Cerebral atrophy. Chronic microhemorrhages in the left frontal and parietal lobes. Vascular: Major arterial flow voids are maintained skull base. Skull and upper cervical spine: Normal marrow signal. Sinuses/Orbits: Clear sinuses.  No acute orbital findings. IMPRESSION: 1. Small acute infarcts in the left thalamus and left periatrial white matter. 2. Advanced chronic microvascular ischemic disease. Electronically Signed   By: Stevenson Elbe M.D.   On: 09/22/2023 02:05   CT Head Wo Contrast Result Date: 09/21/2023 CLINICAL DATA:  Altered level of consciousness, increasing confusion EXAM: CT HEAD WITHOUT CONTRAST TECHNIQUE: Contiguous axial images were obtained from the base of the skull through the vertex without intravenous contrast. RADIATION DOSE REDUCTION: This exam was performed according to the departmental dose-optimization program which includes automated exposure control, adjustment of the mA and/or kV according to patient size and/or use of iterative reconstruction technique. COMPARISON:  08/13/2022 FINDINGS: Brain: Stable hypodensities within the periventricular white matter and basal ganglia consistent with chronic small vessel ischemic changes. No signs of acute infarct or hemorrhage. Lateral ventricles and midline structures are stable. No acute extra-axial fluid collections. No mass effect. Vascular: No hyperdense vessel or unexpected calcification. Skull: Normal. Negative for fracture or focal lesion. Sinuses/Orbits: No acute finding. Other: None. IMPRESSION: 1. Stable head CT, no acute intracranial process. Electronically Signed   By: Bobbye Burrow M.D.   On: 09/21/2023 19:50      Signed: Lanney Pitts, D.O.  Internal Medicine Resident, PGY-1 [facility]  Pager: [phone] 12:27 PM, 09/24/2023

## 2023-09-24 NOTE — Hospital Course (Addendum)
 Small Acute infarct Left thalamus and Left Periatrial white matter  Acute on chronic expressive aphasia  Presented with 3 weeks of worsening confusion and one month of worsening expressive aphasia. MRB with findings of small acute infarcts in the left thalamus and left periarterial white matter. MRA head showed patent intracranial arterial vasculature, mild stenosis R cavernous ICA likely related to atherosclerosis, mild irregularity and narrowing of the mid basilar artery. Per evaluation today, AO x self, neuro exam limited due to confusion. 5/5 strength b/l UE and LE. Unclear if the infarcts are acute as symptoms have been present for over 3 weeks. Neurology is on board: Echo negative for shunting.  Ultrasound of the bilateral carotid showed minimal wall thickening or plaque.  LDL 73, A1c 5.1, INR 1.1, PTT 14.0.  Patient had passed bedside swallow, speech recommended regular diet with fluid/thin consistency.  No show supplements multivitamins placed.  Patient was started on aspirin  81 mg and Pepcid 5 mg daily for 21 days then monotherapy with aspirin  alone.  Patient was started on Lipitor 80 mg daily for goal LDL less than 70.  Patient work with PT OT during this hospitalization, was not a CIR candidate due to does not have 24 hours which supported the house.  Recommended SNF facility.  We do allow permissive hypertension for the first 24 hours and then started patient on amlodipine  10 mg and hydralazine  25 mg every 8 hours to slowly normalize blood pressure readings.  We held losartan .  Altered Mental Status  Chronic Encephalopathy  Has history of heavy EtOH, presented with ~ 4 weeks of worsening confusion and expressive aphasia. On exam, patient has incoherent speech with few words that are understandable. Differentials include but not limited to metabolic vs infectious vs structural vs toxins vs nutritional abnormalities vs wernicke's encephalopathy. Appreciate neurology's recommendations, work up so far  negative for acute metabolic abnormalities.  UA was negative for nitrites or leukocytes.  Imaging was notable for advanced chronic microvascular ischemic disease.  UDS negative.  Alcohol less than 15.  B12 383, folate 8.7, TSH 1.04, RPR nonreactive, vitamin B1 pending.  Patient was started on IV thiamine  daily, plan to stop once discharged.  EEG does not note acute seizures, shows mild diffuse encephalopathy.  Patient was started on nutritional supplements and vitamins.  Confusion appears to be chronic, likely worsened with underlying structural disease.   Previous history of seizure Per chart review, patient used to be on Keppra , however last dispense history 11/2021.  No obvious signs of seizure. -EEG negative for acute seizures, does note mild diffuse encephalopathy.   AKI on CKD stage IV Serum creatinine 4.30 (4.13) baseline 3.4-3.6; elevated likely due to uncontrolled blood pressures, will monitor and plan on slowly normalizing blood pressure readings.   Type 2 diabetes A1c 5.1, CBG 98 this morning. - No SSI needed for now    ============================================================================  You came to the hospital for confusion, were found to have stroke.   Start taking these new medications: - Take Plavix  75 mg, 1 tablet by mouth daily for 17 days - Take aspirin  81 mg, take 1 tab by mouth daily  I am also sending you home with multivitamins and nutritional supplements.  For your blood pressure: -Take amlodipine  10 mg 1 tab by mouth daily - Take hydralazine , 1 tablet by mouth 3 times a day  Stop the following medication: -Do not take losartan , until you are seen by the primary care doctor  For high cholesterol Take Lipitor 80 mg 1  tablet by mouth daily  Please make sure you schedule an appointment after you come out of the rehab with your primary care doctor.  If you have any of these following symptoms, please call us  or seek care at an emergency  department: -Chest Pain -Difficulty Breathing -Worsening abdominal pain -Syncope (passing out) -Drooping of face -Slurred speech -Sudden weakness in your leg or arm -Fever -Chills -blood in the stool -dark black, sticky stool  If you have any questions or concerns, call our clinic at [phone], [phone] or after hours call [phone] and ask for the internal medicine resident on call.  I am glad you are feeling better. It was a pleasure taking care for you. I wish a good recovery and good health!   Dr. Lanney Pitts    ===============================================================================

## 2023-09-24 NOTE — TOC Transition Note (Signed)
 Transition of Care [facility]) - Discharge Note   Patient Details  Name: Gregory Nunez. MRN: [id number] Date of Birth: [birth year]  [facility] CM/SW Contact:  Jannice Mends, LCSW Phone Number: 09/24/2023, 1:42 PM   Clinical Narrative:    Patient will DC to: [facility] Anticipated DC date: 09/24/23 Family notified: Daved Eriksson Transport by: Lyna Sandhoff   Per MD patient ready for DC to [facility]. RN to call report prior to discharge [phone] room 114p). RN, patient, patient's family, and facility notified of DC. Discharge Summary and FL2 sent to facility. DC packet on chart. Ambulance transport requested for patient.   CSW will sign off for now as social work intervention is no longer needed. Please consult us  again if new needs arise.     Final next level of care: Skilled Nursing Facility Barriers to Discharge: Barriers Resolved   Patient Goals and CMS Choice Patient states their goals for this hospitalization and ongoing recovery are:: patient unable to participate in goal setting, not fully oriented CMS Medicare.gov Compare Post Acute Care list provided to:: Patient Represenative (must comment) Choice offered to / list presented to :  Darlin Ehrlich) [place] ownership interest in [facility].provided to::  (Nephew)    Discharge Placement   Existing PASRR number confirmed : 09/24/23          Patient [facility] bed at: [facility] Patient to be transferred to facility by: [facility] Name of family member notified: Nephew Patient and family notified of of transfer: 09/24/23  Discharge Plan and Services Additional resources added to the After Visit Summary for       Post Acute Care Choice: Skilled Nursing Facility                               Social Drivers of Health (SDOH) Interventions SDOH Screenings   Food Insecurity: No Food Insecurity (09/21/2023)  Housing: Low Risk  (09/21/2023)  Transportation Needs: No Transportation Needs  (09/21/2023)  Utilities: Not At Risk (09/21/2023)  Alcohol Screen: Low Risk  (09/21/2023)  Depression (PHQ2-9): Low Risk  (11/03/2022)  Financial Resource Strain: Low Risk  (09/21/2023)  Physical Activity: Insufficiently Active (09/21/2023)  Social Connections: Socially Isolated (09/21/2023)  Stress: No Stress Concern Present (09/21/2023)  Tobacco Use: High Risk (09/21/2023)  Health Literacy: Adequate Health Literacy (11/03/2022)     Readmission Risk Interventions    06/20/2023    2:55 PM 06/15/2023    3:00 PM  Readmission Risk Prevention Plan  Post Dischage Appt  Complete  Medication Screening  Complete  Transportation Screening Complete Complete  PCP or Specialist Appt within 5-7 Days Complete   Home Care Screening Complete   Medication Review (RN CM) Complete

## 2023-09-24 NOTE — TOC Progression Note (Addendum)
 Transition of Care [facility]) - Progression Note    Patient Details  Name: Gregory Nunez. MRN: [id number] Date of Birth: 1945-05-26  Transition of Care [facility]) CM/SW Contact  Jannice Mends, LCSW Phone Number: 09/24/2023, 8:33 AM  Clinical Narrative:    8:33 AM-Awaiting confirmation of bed from [facility]. Insurance approval received, Ref# [id number], Della Fent ID# [id number], effective 09/24/2023-09/27/2023.  12:10 PM-[facility] able to accept patient today after 2pm. CSW updated patient's nephew, [name], and answered questions about future needs for patient. CSW emailed ([email or web address]) him list of ALFs in [place] if he feels that patient's cognition does not improve after rehab to return home with his home aide.     Expected Discharge Plan: Skilled Nursing Facility Barriers to Discharge: Continued Medical Work up, English as a second language teacher  Expected Discharge Plan and Services     Post Acute Care Choice: Skilled Nursing Facility Living arrangements for the past 2 months: Apartment                                       Social Determinants of Health (SDOH) Interventions SDOH Screenings   Food Insecurity: No Food Insecurity (09/21/2023)  Housing: Low Risk  (09/21/2023)  Transportation Needs: No Transportation Needs (09/21/2023)  Utilities: Not At Risk (09/21/2023)  Alcohol Screen: Low Risk  (09/21/2023)  Depression (PHQ2-9): Low Risk  (11/03/2022)  Financial Resource Strain: Low Risk  (09/21/2023)  Physical Activity: Insufficiently Active (09/21/2023)  Social Connections: Socially Isolated (09/21/2023)  Stress: No Stress Concern Present (09/21/2023)  Tobacco Use: High Risk (09/21/2023)  Health Literacy: Adequate Health Literacy (11/03/2022)    Readmission Risk Interventions    06/20/2023    2:55 PM 06/15/2023    3:00 PM  Readmission Risk Prevention Plan  Post Dischage Appt  Complete  Medication Screening  Complete  Transportation Screening Complete Complete   PCP or Specialist Appt within 5-7 Days Complete   Home Care Screening Complete   Medication Review (RN CM) Complete

## 2023-09-29 LAB — VITAMIN B1: Vitamin B1 (Thiamine): 55 nmol/L — ABNORMAL LOW (ref 66.5–200.0)

## 2023-09-30 NOTE — Progress Notes (Signed)
 Internal Medicine Attending:  I reviewed the AWV findings of the medical professional who conducted the visit. I was present in the office suite and immediately available to provide assistance and direction throughout the time the service was provided.

## 2023-10-01 ENCOUNTER — Encounter (HOSPITAL_COMMUNITY): Payer: Self-pay

## 2023-10-01 ENCOUNTER — Other Ambulatory Visit: Payer: Self-pay

## 2023-10-01 ENCOUNTER — Observation Stay (HOSPITAL_COMMUNITY)
Admission: EM | Admit: 2023-10-01 | Discharge: 2023-10-04 | Disposition: A | Discharge status: Skilled Nursing Facility | Attending: Internal Medicine | Admitting: Internal Medicine

## 2023-10-01 DIAGNOSIS — I69322 Dysarthria following cerebral infarction: Secondary | ICD-10-CM

## 2023-10-01 DIAGNOSIS — D631 Anemia in chronic kidney disease: Secondary | ICD-10-CM | POA: Diagnosis not present

## 2023-10-01 DIAGNOSIS — K922 Gastrointestinal hemorrhage, unspecified: Principal | ICD-10-CM | POA: Diagnosis present

## 2023-10-01 DIAGNOSIS — B182 Chronic viral hepatitis C: Secondary | ICD-10-CM | POA: Diagnosis present

## 2023-10-01 DIAGNOSIS — E1122 Type 2 diabetes mellitus with diabetic chronic kidney disease: Secondary | ICD-10-CM | POA: Diagnosis not present

## 2023-10-01 DIAGNOSIS — Z79899 Other long term (current) drug therapy: Secondary | ICD-10-CM | POA: Insufficient documentation

## 2023-10-01 DIAGNOSIS — Z87891 Personal history of nicotine dependence: Secondary | ICD-10-CM | POA: Diagnosis not present

## 2023-10-01 DIAGNOSIS — R2689 Other abnormalities of gait and mobility: Secondary | ICD-10-CM | POA: Diagnosis not present

## 2023-10-01 DIAGNOSIS — D62 Acute posthemorrhagic anemia: Principal | ICD-10-CM | POA: Insufficient documentation

## 2023-10-01 DIAGNOSIS — K31819 Angiodysplasia of stomach and duodenum without bleeding: Secondary | ICD-10-CM | POA: Insufficient documentation

## 2023-10-01 DIAGNOSIS — K3189 Other diseases of stomach and duodenum: Secondary | ICD-10-CM | POA: Insufficient documentation

## 2023-10-01 DIAGNOSIS — D509 Iron deficiency anemia, unspecified: Secondary | ICD-10-CM | POA: Diagnosis present

## 2023-10-01 DIAGNOSIS — R4701 Aphasia: Secondary | ICD-10-CM | POA: Insufficient documentation

## 2023-10-01 DIAGNOSIS — N184 Chronic kidney disease, stage 4 (severe): Secondary | ICD-10-CM | POA: Insufficient documentation

## 2023-10-01 DIAGNOSIS — K2970 Gastritis, unspecified, without bleeding: Secondary | ICD-10-CM | POA: Diagnosis not present

## 2023-10-01 DIAGNOSIS — I152 Hypertension secondary to endocrine disorders: Secondary | ICD-10-CM | POA: Diagnosis present

## 2023-10-01 DIAGNOSIS — Q2739 Arteriovenous malformation, other site: Secondary | ICD-10-CM | POA: Insufficient documentation

## 2023-10-01 DIAGNOSIS — I69822 Dysarthria following other cerebrovascular disease: Secondary | ICD-10-CM | POA: Diagnosis not present

## 2023-10-01 DIAGNOSIS — E1159 Type 2 diabetes mellitus with other circulatory complications: Secondary | ICD-10-CM | POA: Diagnosis present

## 2023-10-01 DIAGNOSIS — Z8673 Personal history of transient ischemic attack (TIA), and cerebral infarction without residual deficits: Secondary | ICD-10-CM | POA: Insufficient documentation

## 2023-10-01 DIAGNOSIS — K5521 Angiodysplasia of colon with hemorrhage: Secondary | ICD-10-CM

## 2023-10-01 DIAGNOSIS — K5520 Angiodysplasia of colon without hemorrhage: Secondary | ICD-10-CM | POA: Diagnosis present

## 2023-10-01 DIAGNOSIS — K921 Melena: Secondary | ICD-10-CM | POA: Diagnosis present

## 2023-10-01 DIAGNOSIS — K31811 Angiodysplasia of stomach and duodenum with bleeding: Secondary | ICD-10-CM | POA: Diagnosis present

## 2023-10-01 DIAGNOSIS — K269 Duodenal ulcer, unspecified as acute or chronic, without hemorrhage or perforation: Secondary | ICD-10-CM

## 2023-10-01 DIAGNOSIS — Z7982 Long term (current) use of aspirin: Secondary | ICD-10-CM | POA: Insufficient documentation

## 2023-10-01 DIAGNOSIS — D649 Anemia, unspecified: Secondary | ICD-10-CM | POA: Diagnosis present

## 2023-10-01 HISTORY — DX: Dysarthria following cerebral infarction: I69.322

## 2023-10-01 HISTORY — DX: Angiodysplasia of stomach and duodenum with bleeding: K31.811

## 2023-10-01 HISTORY — DX: Epilepsy, unspecified, not intractable, without status epilepticus: G40.909

## 2023-10-01 HISTORY — DX: Cognitive communication deficit: R41.841

## 2023-10-01 HISTORY — DX: Unspecified viral hepatitis C without hepatic coma: B19.20

## 2023-10-01 HISTORY — DX: Chronic kidney disease, stage 4 (severe): N18.4

## 2023-10-01 LAB — POC OCCULT BLOOD, ED: Fecal Occult Bld: POSITIVE — AB

## 2023-10-01 LAB — CBC WITH DIFFERENTIAL/PLATELET
Abs Immature Granulocytes: 0.03 10*3/uL (ref 0.00–0.07)
Basophils Absolute: 0 10*3/uL (ref 0.0–0.1)
Basophils Relative: 0 %
Eosinophils Absolute: 0.2 10*3/uL (ref 0.0–0.5)
Eosinophils Relative: 5 %
HCT: 15.7 % — ABNORMAL LOW (ref 39.0–52.0)
Hemoglobin: 5 g/dL — CL (ref 13.0–17.0)
Immature Granulocytes: 1 %
Lymphocytes Relative: 19 %
Lymphs Abs: 0.7 10*3/uL (ref 0.7–4.0)
MCH: 29.2 pg (ref 26.0–34.0)
MCHC: 31.8 g/dL (ref 30.0–36.0)
MCV: 91.8 fL (ref 80.0–100.0)
Monocytes Absolute: 0.4 10*3/uL (ref 0.1–1.0)
Monocytes Relative: 11 %
Neutro Abs: 2.5 10*3/uL (ref 1.7–7.7)
Neutrophils Relative %: 64 %
Platelets: 138 10*3/uL — ABNORMAL LOW (ref 150–400)
RBC: 1.71 MIL/uL — ABNORMAL LOW (ref 4.22–5.81)
RDW: 14.8 % (ref 11.5–15.5)
WBC: 3.9 10*3/uL — ABNORMAL LOW (ref 4.0–10.5)
nRBC: 0 % (ref 0.0–0.2)

## 2023-10-01 LAB — COMPREHENSIVE METABOLIC PANEL WITH GFR
ALT: 33 U/L (ref 0–44)
AST: 33 U/L (ref 15–41)
Albumin: 2.8 g/dL — ABNORMAL LOW (ref 3.5–5.0)
Alkaline Phosphatase: 43 U/L (ref 38–126)
Anion gap: 9 (ref 5–15)
BUN: 108 mg/dL — ABNORMAL HIGH (ref 8–23)
CO2: 19 mmol/L — ABNORMAL LOW (ref 22–32)
Calcium: 8.4 mg/dL — ABNORMAL LOW (ref 8.9–10.3)
Chloride: 112 mmol/L — ABNORMAL HIGH (ref 98–111)
Creatinine, Ser: 4.23 mg/dL — ABNORMAL HIGH (ref 0.61–1.24)
GFR, Estimated: 14 mL/min — ABNORMAL LOW (ref >60)
Glucose, Bld: 135 mg/dL — ABNORMAL HIGH (ref 70–99)
Potassium: 4.6 mmol/L (ref 3.5–5.1)
Sodium: 140 mmol/L (ref 135–145)
Total Bilirubin: 0.5 mg/dL (ref 0.0–1.2)
Total Protein: 6.3 g/dL — ABNORMAL LOW (ref 6.5–8.1)

## 2023-10-01 LAB — PREPARE RBC (CROSSMATCH)

## 2023-10-01 MED ORDER — SODIUM CHLORIDE 0.9% IV SOLUTION
Freq: Once | INTRAVENOUS | Status: AC
Start: 2023-10-01 — End: 2023-10-02

## 2023-10-01 MED ORDER — PANTOPRAZOLE SODIUM 40 MG IV SOLR
40.0000 mg | Freq: Two times a day (BID) | INTRAVENOUS | Status: DC
  Administered 2023-10-02 – 2023-10-03 (×2): 40 mg via INTRAVENOUS
  Filled 2023-10-01 (×2): qty 10

## 2023-10-01 MED ORDER — PROSIGHT PO TABS
1.0000 | ORAL_TABLET | Freq: Every day | ORAL | Status: DC
  Administered 2023-10-03 – 2023-10-04 (×2): 1 via ORAL
  Filled 2023-10-01 (×3): qty 1

## 2023-10-01 MED ORDER — FERROUS SULFATE 325 (65 FE) MG PO TABS
325.0000 mg | ORAL_TABLET | Freq: Every day | ORAL | Status: DC
  Administered 2023-10-02 – 2023-10-04 (×3): 325 mg via ORAL
  Filled 2023-10-01 (×3): qty 1

## 2023-10-01 MED ORDER — ENSURE PLUS HIGH PROTEIN PO LIQD
237.0000 mL | Freq: Three times a day (TID) | ORAL | Status: DC
  Administered 2023-10-03 – 2023-10-04 (×4): 237 mL via ORAL
  Filled 2023-10-01: qty 237

## 2023-10-01 MED ORDER — ATORVASTATIN CALCIUM 80 MG PO TABS
80.0000 mg | ORAL_TABLET | Freq: Every day | ORAL | Status: DC
  Administered 2023-10-02 – 2023-10-04 (×3): 80 mg via ORAL
  Filled 2023-10-01 (×3): qty 1

## 2023-10-01 MED ORDER — PANTOPRAZOLE SODIUM 40 MG IV SOLR
40.0000 mg | INTRAVENOUS | Status: AC
  Administered 2023-10-01: 40 mg via INTRAVENOUS
  Filled 2023-10-01 (×2): qty 10

## 2023-10-01 MED ORDER — THIAMINE MONONITRATE 100 MG PO TABS
100.0000 mg | ORAL_TABLET | Freq: Every day | ORAL | Status: DC
  Administered 2023-10-02 – 2023-10-04 (×3): 100 mg via ORAL
  Filled 2023-10-01 (×4): qty 1

## 2023-10-01 MED ORDER — SODIUM CHLORIDE 0.9% FLUSH
3.0000 mL | Freq: Two times a day (BID) | INTRAVENOUS | Status: DC
  Administered 2023-10-02 – 2023-10-04 (×4): 3 mL via INTRAVENOUS

## 2023-10-01 MED ORDER — HYDRALAZINE HCL 25 MG PO TABS
25.0000 mg | ORAL_TABLET | Freq: Three times a day (TID) | ORAL | Status: DC
  Administered 2023-10-02 – 2023-10-04 (×7): 25 mg via ORAL
  Filled 2023-10-01 (×8): qty 1

## 2023-10-01 MED ORDER — AMLODIPINE BESYLATE 10 MG PO TABS
10.0000 mg | ORAL_TABLET | Freq: Every day | ORAL | Status: DC
  Administered 2023-10-02 – 2023-10-04 (×3): 10 mg via ORAL
  Filled 2023-10-01 (×3): qty 1

## 2023-10-01 NOTE — ED Notes (Signed)
 1st unit PRBC infusing at 120 ml/hr x 15 mins  . IV site unremarkable , denies pain /respirations unlabored , afebrile , EDP explained tests results and plan of care to patient .

## 2023-10-01 NOTE — ED Notes (Signed)
 No fecal impaction checked by ED RN .

## 2023-10-01 NOTE — ED Notes (Signed)
 2nd unit PRBC started at 120 ml/hr , afebrile , denies pain/respirations unlabored .

## 2023-10-01 NOTE — ED Notes (Signed)
 1st unit PRBC infusing with adverse effect , denies pain/respirations unlabored , afebrile .

## 2023-10-01 NOTE — ED Provider Notes (Signed)
 [place] EMERGENCY DEPARTMENT AT [facility] Provider Note   CSN: [id number] Arrival date & time: 10/01/23  1806     Patient presents with: Low hemoglobin   Gregory Nunez. is a 78 y.o. 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  Patient had routine lab work done at facility with a hemoglobin of 5.9.  Patient reports he has some fatigue but no other complaints.  He denies any belly pain.  He does not notice any changes to his stool.  HPI     Prior to Admission medications   Medication Sig Start Date End Date Taking? Authorizing Provider  amLODipine  (NORVASC ) 10 MG tablet Take 1 tablet (10 mg total) by mouth daily. 06/20/23   Lonita Roach, MD  aspirin  EC 81 MG tablet Take 1 tablet (81 mg total) by mouth daily. Swallow whole. 09/25/23   Tawkaliyar, Roya, DO  atorvastatin  (LIPITOR ) 80 MG tablet Take 1 tablet (80 mg total) by mouth daily. 09/25/23   Tawkaliyar, Roya, DO  clopidogrel  (PLAVIX ) 75 MG tablet Take 1 tablet (75 mg total) by mouth daily for 17 days. 09/25/23 10/12/23  Tawkaliyar, Roya, DO  feeding supplement (ENSURE PLUS HIGH PROTEIN) LIQD Take 237 mLs by mouth 3 (three) times daily between meals. 09/24/23   Lanney Pitts, DO  ferrous sulfate  325 (65 FE) MG tablet Take 1 tablet (325 mg total) by mouth daily with breakfast. 06/21/23   Lonita Roach, MD  hydrALAZINE  (APRESOLINE ) 25 MG tablet Take 1 tablet (25 mg total) by mouth every 8 (eight) hours. 10/14/22   Vita Grip, MD  losartan  (COZAAR ) 25 MG tablet Take 1 tablet (25 mg total) by mouth daily. 06/21/23   Lonita Roach, MD  multivitamin (PROSIGHT) TABS tablet Take 1 tablet by mouth daily. 09/25/23   Tawkaliyar, Roya, DO  COMBIVENT RESPIMAT 20-100 MCG/ACT AERS respimat Inhale 1 puff into the lungs every 6 (six) hours as needed for  shortness of breath. 06/12/18 08/19/19  [provider]  mometasone -formoterol  (DULERA) 100-5 MCG/ACT AERO Inhale 2 puffs into the lungs daily. Patient not taking: Reported on 08/19/2019 04/17/18 08/19/19  Fonnie Iba I, MD  sucralfate  (CARAFATE ) 1 g tablet Take 1 tablet (1 g total) by mouth 4 (four) times daily. Patient not taking: Reported on 08/19/2019 04/17/18 08/19/19  Fonnie Iba I, MD  tiotropium (SPIRIVA  HANDIHALER) 18 MCG inhalation capsule Place 1 capsule (18 mcg total) into inhaler and inhale daily. Patient not taking: Reported on 08/19/2019 04/17/18 08/19/19  Fonnie Iba I, MD  traZODone  (DESYREL ) 50 MG tablet Take 50 mg by mouth at bedtime as needed for sleep. Patient not taking: Reported on 10/06/2020  10/08/20  [provider]    Allergies: Penicillins    Review of Systems  Constitutional:  Positive for fatigue.  Gastrointestinal:  Negative for abdominal pain.    Updated Vital Signs BP (!) 129/59   Pulse 98   Temp 98.4 F (36.9 C) (Oral)   Resp 17   Ht 6' (1.829 m)   SpO2 100%   BMI 19.67 kg/m   Physical Exam Vitals and nursing note reviewed. Exam conducted with a chaperone present Kaye Parsons, EMT).  Constitutional:      Appearance: He is not toxic-appearing.     Comments: Chronically ill-appearing, does appear slightly pale from patient picture on chart   Eyes:  General: No scleral icterus.   Cardiovascular:     Rate and Rhythm: Tachycardia present.  Pulmonary:     Effort: Pulmonary effort is normal. No respiratory distress.  Abdominal:     Palpations: Abdomen is soft.     Tenderness: There is no abdominal tenderness. There is no guarding or rebound.     Comments: No abdominal tenderness to palpation.  Soft.  No guarding or rebound.  Genitourinary:    Comments: Small amount of darker stool present on DRE.  No frank red blood.  Skin:    General: Skin is warm and dry.   Neurological:     Mental Status: He is alert.     (all labs  ordered are listed, but only abnormal results are displayed) Labs Reviewed  CBC WITH DIFFERENTIAL/PLATELET - Abnormal; Notable for the following components:      Result Value   WBC 3.9 (*)    RBC 1.71 (*)    Hemoglobin 5.0 (*)    HCT 15.7 (*)    Platelets 138 (*)    All other components within normal limits  COMPREHENSIVE METABOLIC PANEL WITH GFR  POC OCCULT BLOOD, ED  TYPE AND SCREEN    EKG: None  Radiology: No results found.  .Critical Care  Performed by: Spence Dux, PA-C Authorized by: Spence Dux, PA-C   Critical care provider statement:    Critical care time (minutes):  45   Critical care was necessary to treat or prevent imminent or life-threatening deterioration of the following conditions: anemia requiring multiple transfusions.   Critical care was time spent personally by me on the following activities:  Development of treatment plan with patient or surrogate, discussions with consultants, evaluation of patient's response to treatment, examination of patient, ordering and review of laboratory studies, ordering and review of radiographic studies, ordering and performing treatments and interventions, pulse oximetry, re-evaluation of patient's condition, review of old charts and obtaining history from patient or surrogate   Care discussed with: admitting provider      Medications Ordered in the ED - No data to display  Clinical Course as of 10/01/23 2352  Sat Oct 01, 2023  1939 Heme positive, verbal result from lab tech.  [RR]    Clinical Course User Index [RR] Spence Dux, PA-C   Medical Decision Making Amount and/or Complexity of Data Reviewed Labs: ordered.  Risk Prescription drug management. Decision regarding hospitalization.   78 y.o. male presents to the ER for evaluation of low blood count. Differential diagnosis includes but is not limited to GI bleed, IDA, lab error. Vital signs BP 163/91, HR 100, otherwise unremarkable. Physical exam as noted  above.   I independently reviewed and interpreted the patient's labs. CMP shows cord of 112, bicarb of 19, glucose 135.  BUN at 108 which is more elevated than previous.  Creatinine 4.23 which appears around his baseline.  Calcium  0.4, total protein 6.3, albumin 2.8.  No other electrolyte or LFT abnormality CBC does show slight leukopenia at 3.9.  Hemoglobin decreased at 5.0.  Hematocrit 15.7.  Platelets at 13.8.  It appears his baseline hemoglobin is around 9.  He is Hemoccult positive.  When doing DRE, patient had foul-smelling urine, did add on urinalysis for this.  He has no abdominal tenderness palpation.  He is not complaining of any abdominal pain.  He does not have any frank red blood on digital rectal exam.  I discussed with my attending do not see CT imaging needed at this time.  Given  his hemoglobin of 5.0, I have ordered 3 units for transfusion.  We discussed transfusion with the patient which he agrees to.  He will need admission to medicine for further evaluation on this. Admit to IM Teaching Service.   I discussed this case with my attending physician who cosigned this note including patient's presenting symptoms, physical exam, and planned diagnostics and interventions. Attending physician stated agreement with plan or made changes to plan which were implemented.   Attending physician assessed patient at bedside.  Portions of this report may have been transcribed using voice recognition software. Every effort was made to ensure accuracy; however, inadvertent computerized transcription errors may be present.    Final diagnoses:  Gastrointestinal hemorrhage, unspecified gastrointestinal hemorrhage type    ED Discharge Orders     None          Spence Dux, [place] 10/02/23 0004    Almond Army, MD 10/02/23 2311

## 2023-10-01 NOTE — ED Notes (Signed)
 1st unit PRBC completed with no adverse effect , afebrile , denies pain /respirations unlabored .

## 2023-10-01 NOTE — ED Notes (Signed)
 2nd unit PRBC infusing at 250 ml/hr , no adverse effect , afebrile , denies pain/respirations unlabored .

## 2023-10-01 NOTE — H&P (Cosign Needed)
 Date: 10/01/2023               Patient Name:  Gregory Nunez. MRN: [id number]  DOB: Feb 06, 1946 Age / Sex: 78 y.o., male   PCP: Malen Scudder, DO         Medical Service: Internal Medicine Teaching Service         Attending Physician: IMTSattending2025/2026: Dr. Tod Forward      First Contact: Dr. Carleen Chary, DO    Second Contact: Dr. Jonelle Neri, DO          Pager Information: First Contact Pager: [phone]   Second Contact Pager: [phone]   SUBJECTIVE   Chief Complaint: Abnormal hemoglobin at SNF  History of Present Illness: Gregory Nunez. is a 78 y.o. male with PMH of CKD IV, T2DM, HTN, alcohol use disorder, cognitive decline at baseline recently discharged from the IMTS after diagnosis of L infarct in the L thalamus and parietal white matter, being brought to the [facility] from [facility] for a repeat hgb of 5.9 after 6/7 discharge hemoglobin of 8.8.  Of note, patient was recently discharged from IMTS service on DAPT therapy for an acute CVA. Per nursing home papers, he has been compliant with medications. They have noted melatonic stools but no frank blood in stool or urine. No hematemesis, abdominal discomfort, bruising, or falls.   Denies chest pain, shortness of breath, lightheadedness. At times feels palpitations but has not been persistent.   In the ED, patient had +FOBT without frank blood on rectal exam. Repeat labs in the ED with Hgb 5.0. Patient was started on pRBC transfusion and IV protonix .   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 Hyperlipidemia Iron  deficiency anemia Impaired mobility Tobacco use disorder Prior history of alcohol use, in remission  Past Surgical History Past Surgical History:  Procedure Laterality Date   BIOPSY  04/16/2018   Procedure: BIOPSY;  Surgeon: Tami Falcon,  MD;  Location: [facility];  Service: Endoscopy;;   BIOPSY  05/23/2020   Procedure: BIOPSY;  Surgeon: Lajuan Pila, MD;  Location: [facility];  Service: Gastroenterology;;  EGD and COLON   BIOPSY  12/09/2020   Procedure: BIOPSY;  Surgeon: Lindle Rhea, MD;  Location: [facility] ENDOSCOPY;  Service: Gastroenterology;;   biopsy on throat     hx of    CATARACT EXTRACTION Bilateral    Dr. Donovan Gallant   COLONOSCOPY N/A 05/23/2020   Procedure: COLONOSCOPY;  Surgeon: Lajuan Pila, MD;  Location: [facility];  Service: Gastroenterology;  Laterality: N/A;   ENTEROSCOPY N/A 10/07/2020   Procedure: ENTEROSCOPY;  Surgeon: Kenney Peacemaker, MD;  Location[facility] ENDOSCOPY;  Service: Endoscopy;  Laterality: N/A;   ESOPHAGOGASTRODUODENOSCOPY Left 04/16/2018   Procedure: ESOPHAGOGASTRODUODENOSCOPY (EGD);  Surgeon: Tami Falcon, MD;  Location: [facility];  Service: Endoscopy;  Laterality: Left;   ESOPHAGOGASTRODUODENOSCOPY (EGD) WITH PROPOFOL  N/A 05/23/2020   Procedure: ESOPHAGOGASTRODUODENOSCOPY (EGD) WITH PROPOFOL ;  Surgeon: Lajuan Pila, MD;  Location: [facility];  Service: Gastroenterology;  Laterality: N/A;   ESOPHAGOGASTRODUODENOSCOPY (EGD) WITH PROPOFOL  N/A 12/09/2020   Procedure: ESOPHAGOGASTRODUODENOSCOPY (EGD) WITH PROPOFOL ;  Surgeon: Lindle Rhea, MD;  Location: [facility] ENDOSCOPY;  Service: Gastroenterology;  Laterality: N/A;   EYE SURGERY     FOOT SURGERY     HOT HEMOSTASIS N/A 04/16/2018   Procedure: HOT HEMOSTASIS (ARGON PLASMA COAGULATION/BICAP);  Surgeon: Tami Falcon, MD;  Location: [facility];  Service: Endoscopy;  Laterality: N/A;   HOT HEMOSTASIS N/A 05/23/2020   Procedure: HOT HEMOSTASIS (ARGON PLASMA COAGULATION/BICAP);  Surgeon: Lajuan Pila, MD;  Location: [facility];  Service: Gastroenterology;  Laterality: N/A;   HOT HEMOSTASIS N/A 12/09/2020   Procedure: HOT HEMOSTASIS (ARGON PLASMA COAGULATION/BICAP);  Surgeon: Lindle Rhea, MD;  Location: [facility] ENDOSCOPY;  Service:  Gastroenterology;  Laterality: N/A;   LYMPHADENECTOMY Bilateral 02/27/2014   Procedure: BILATERAL LYMPHADENECTOMY;  Surgeon: Osborn Blaze, MD;  Location: [facility] ORS;  Service: Urology;  Laterality: Bilateral;   POLYPECTOMY  05/23/2020   Procedure: POLYPECTOMY;  Surgeon: Lajuan Pila, MD;  Location: [facility] ENDOS[facility];  Service: Gastroenterology;;   PROSTATE BIOPSY  12/2013   Gleason 4+3=7, volume 31.31 cc   ROBOT ASSISTED LAPAROSCOPIC RADICAL PROSTATECTOMY N/A 02/27/2014   Procedure: ROBOTIC ASSISTED LAPAROSCOPIC RADICAL PROSTATECTOMY WITH INDOCYANINE GREEN DYE;  Surgeon: Osborn Blaze, MD;  Location: [facility];  Service: Urology;  Laterality: N/A;    Meds:  Hydralazine  25 mg TID - last given at 3PM Amlodipine  10 mg daily  Atorvastatin  80 mg daily ASA 81 mg daily - last given at 10A Ferrous sulfate  325 mg tablet last given at 10A Plavix  75 mg daily   Social:  Lives at [facility] Support: his brother Keane Martelli  Level of Function: ambulates with rollator walker PCP:  Malen Scudder, DO  Substances: prior history of tobacco and alcohol use, in remission. No recreational drus use  Family History:  Family History  Problem Relation Age of Onset   Heart disease Mother    Heart attack Father 23   Cancer Sister        breast   Colon cancer Neg Hx    Esophageal cancer Neg Hx    Rectal cancer Neg Hx    Stomach cancer Neg Hx      Allergies: Allergies as of 10/01/2023 - Review Complete 10/01/2023  Allergen Reaction Noted   Penicillins Other (See Comments) 05/16/2011    Review of Systems: A complete ROS was negative except as per HPI.   OBJECTIVE:   Physical Exam: Blood pressure (!) 133/58, pulse (!) 103, temperature 98.5 F (36.9 C), temperature source Oral, resp. rate 14, height 6' (1.829 m), SpO2 100%.  Constitutional: Chronically ill-appearing man laying in bed in no acute distress HENT: Moist MM Eyes: anicteric sclera Cardiovascular: tachycardia   Pulmonary/Chest: normal work of breathing on room air, lungs clear to auscultation bilaterally Abdominal: soft, non-tender, non-distended MSK: decrease muscle bulk, normal tone. Neurological: alert, oriented to self, to place, and situation. Face symmetric, dysarthric and intermittent expressive aphasia,  5/5 strength in bilateral upper and lower extremities, gait exam deferred Skin: warm and dry Psych: Pleasant mood and affect  Labs: CBC    Component Value Date/Time   WBC 3.9 (L) 10/01/2023 1826   RBC 1.71 (L) 10/01/2023 1826   HGB 5.0 (LL) 10/01/2023 1826   HGB 10.6 (L) 10/14/2022 1016   HCT 15.7 (L) 10/01/2023 1826   HCT 32.2 (L) 10/14/2022 1016   PLT 138 (L) 10/01/2023 1826   PLT 166 10/14/2022 1016   MCV 91.8 10/01/2023 1826   MCV 93 10/14/2022 1016   MCH 29.2 10/01/2023 1826   MCHC 31.8 10/01/2023 1826   RDW 14.8 10/01/2023 1826   RDW 11.9 10/14/2022 1016   LYMPHSABS 0.7 10/01/2023 1826   MONOABS 0.4 10/01/2023 1826   EOSABS 0.2 10/01/2023 1826   BASOSABS 0.0 10/01/2023 1826     CMP     Component Value Date/Time  NA 140 10/01/2023 1826   NA 140 10/14/2022 1016   K 4.6 10/01/2023 1826   CL 112 (H) 10/01/2023 1826   CO2 19 (L) 10/01/2023 1826   GLUCOSE 135 (H) 10/01/2023 1826   BUN 108 (H) 10/01/2023 1826   BUN 39 (H) 10/14/2022 1016   CREATININE 4.23 (H) 10/01/2023 1826   CREATININE 1.52 (H) 08/28/2020 1427   CALCIUM  8.4 (L) 10/01/2023 1826   PROT 6.3 (L) 10/01/2023 1826   PROT 7.4 10/14/2022 1016   ALBUMIN 2.8 (L) 10/01/2023 1826   ALBUMIN 4.0 10/14/2022 1016   AST 33 10/01/2023 1826   ALT 33 10/01/2023 1826   ALKPHOS 43 10/01/2023 1826   BILITOT 0.5 10/01/2023 1826   BILITOT <0.2 10/14/2022 1016   GFRNONAA 14 (L) 10/01/2023 1826   GFRNONAA 45 (L) 08/28/2020 1427   GFRAA 52 (L) 08/28/2020 1427    Imaging: None  EKG: personally reviewed my interpretation is sinus tachycardia with nonspecific T wave abnormalities seen on prior EKG in April 2025.    ASSESSMENT & PLAN:   Assessment & Plan by Problem: Principal Problem:   Acute on chronic anemia Active Problems:   Hypertension associated with diabetes (HCC)   History of CVA (cerebrovascular accident)   CKD (chronic kidney disease) stage 4, GFR 15-29 ml/min (HCC)   Iron  deficiency anemia   Angiodysplasia of intestine   AVM (arteriovenous malformation) of duodenum, acquired with hemorrhage   Anemia due to chronic kidney disease   Chronic active hepatitis C (HCC)   Aphasia   GI bleed   Dysarthria following cerebral infarction   Gregory Leaven Jaison Petraglia. is a 78 y.o. person living with a history of multiple CVAs, last one on 09/2023, CKD IV, T2DM, HTN, alcohol use disorder, cognitive impairment, expressive aphasia at baseline admitted for acute on chronic anemia on hospital day 0  Acute on chronic anemia Suspected GI bleed Iron  deficiency anemia Hx duodenal AVMs Anemia of chronic disease Last hospitalized for same in 05/2023 thought to be in setting of CKD, treated with EPO and pRBC. Hgb during recent hospitalization for acute CVA on 09/2023 remained stable between 8.3-9.0. Patient was discharged on DAPT, which has been continued long term facility. With his history of stomach and duodenum angiodysplasia without esophageal varices per prior EGD (last in 2022), no recent PPI on medication history, and DAPT, suspect his acute anemia is secondary to blood loss. Currently with mild tachycardia otherwise HDS. Will manage with: - Transfusion of 3 units of pRBC, ordered by ED provider - Follow up post transfusion HH - Orthostatic vital signs - IV PPI - Clear liquid diet - GI consult to follow in the AM by day team - Hold ASA and Plavix  until Hgb normalizes - Follow up Iron  studies; will likely need IV iron  supplementation - Follow up reticulocyte counts, PT, INR - Monitor Hgb daily   CKD IV Likely secondary to uncontrolled hypertension. Baseline prior to 09/2023 hospitalization 3.4-3.6.  Since, Cr ~4.30, around same today, likely progression of disease. 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 Will hold ASA and clopidogrel  in the setting of acute anemia requiring transfusions - Holding ASA and Plavix  - Will need to weigh benefits and risk of long term DAPT for this  patient - Continue Atorvastatin  80 mg daily - Will consult PT and OT to continue therapy during this hospitalization - Lives at long term facility  Hepatitis C with partial treatment Advanced fibrosis per Fib-4 No documented history of cirrhosis ; has a history of alcohol use disorder, now in remission, and chronic hepatitis C. Per chart review, he received incomplete treatment with Dr. Alwin Baars in 2024. Fib4 score 3.59, suggesting advanced fibrosis. Last liver US  in 2024 without changes, however, he will likely need elastogram. No cirrhosis stigmata on physical exam.  - Consider elastogram for this patient - Will repeat Hep C RNA quant - Monitor hepatic function during hospitalization  Thrombocytopenia Has been chronic for this patient. Currently improved from 116 to 120 since discharge. Normal B12 and folate in 09/2023. Etiology may be explained for TPO decrease in setting of chronic Hep C, malnutrition, marrow supression. Low probability of HIT in this patient recently - Follow up the smear - Hep C RNA quant as above - Monitor PLT count - No need for transfusion at this time  HTN Mildly elevated today. Last dose of medications at noon. - Will resume Amlodipine  and hydralazine  - Continue to hold ARB (discontinued at last hospitalization)  T2DM - Diet controlled. Last A1c in June 2025 5.1 - Does not need SSI  - Monitor  on plasma labs  Protein calorie malnutrition Thiamine  deficiency - Ensure  - Thiamine  replacement  Hx of alcohol use disorder Alcohol withdrawal seizure Hx tobacco use disorder Last Keppra  dose in 2023. No recent documented seizure activity. No recent hx of alcohol use - Do not think this person needs CIWA at this time   Best practice: Diet: CLD VTE: SCD Code: Full per facility documentation and patient discussion in room  Disposition planning: Prior to Admission Living Arrangement: SNF, [facility] Anticipated Discharge Location: SNF Barriers to Discharge: Clinical stability  Family update: Attempted calling brother x2 without success  Dispo: Admit patient to Observation with expected length of stay less than 2 midnights.  Signed: Cathey Clunes, MD Internal Medicine Resident  10/01/2023, 10:30 PM  Please contact IM Residency On-Call Pager at: [phone] or [phone].

## 2023-10-01 NOTE — ED Triage Notes (Signed)
 Pt presents to ED via EMS form [facility] for low hemoglobin. 5.9. Pt has no complaints.

## 2023-10-01 NOTE — ED Notes (Signed)
Patient signed consent form for blood transfusion . 

## 2023-10-02 DIAGNOSIS — D62 Acute posthemorrhagic anemia: Secondary | ICD-10-CM | POA: Diagnosis not present

## 2023-10-02 DIAGNOSIS — D649 Anemia, unspecified: Secondary | ICD-10-CM | POA: Diagnosis not present

## 2023-10-02 LAB — TYPE AND SCREEN
ABO/RH(D): O POS
Antibody Screen: NEGATIVE
Unit division: 0
Unit division: 0
Unit division: 0

## 2023-10-02 LAB — TECHNOLOGIST SMEAR REVIEW: Plt Morphology: DECREASED

## 2023-10-02 LAB — CBC WITH DIFFERENTIAL/PLATELET
Abs Immature Granulocytes: 0.07 10*3/uL (ref 0.00–0.07)
Basophils Absolute: 0 10*3/uL (ref 0.0–0.1)
Basophils Relative: 0 %
Eosinophils Absolute: 0.2 10*3/uL (ref 0.0–0.5)
Eosinophils Relative: 4 %
HCT: 28.4 % — ABNORMAL LOW (ref 39.0–52.0)
Hemoglobin: 9.7 g/dL — ABNORMAL LOW (ref 13.0–17.0)
Immature Granulocytes: 1 %
Lymphocytes Relative: 16 %
Lymphs Abs: 0.9 10*3/uL (ref 0.7–4.0)
MCH: 29.9 pg (ref 26.0–34.0)
MCHC: 34.2 g/dL (ref 30.0–36.0)
MCV: 87.7 fL (ref 80.0–100.0)
Monocytes Absolute: 0.6 10*3/uL (ref 0.1–1.0)
Monocytes Relative: 10 %
Neutro Abs: 4 10*3/uL (ref 1.7–7.7)
Neutrophils Relative %: 69 %
Platelets: 126 10*3/uL — ABNORMAL LOW (ref 150–400)
RBC: 3.24 MIL/uL — ABNORMAL LOW (ref 4.22–5.81)
RDW: 14.6 % (ref 11.5–15.5)
WBC: 5.8 10*3/uL (ref 4.0–10.5)
nRBC: 0 % (ref 0.0–0.2)

## 2023-10-02 LAB — BPAM RBC
Blood Product Expiration Date: 202507082359
Blood Product Expiration Date: 202507082359
Blood Product Expiration Date: 202507112359
ISSUE DATE / TIME: 202506141938
ISSUE DATE / TIME: 202506142134
ISSUE DATE / TIME: 202506142335
Unit Type and Rh: 5100
Unit Type and Rh: 5100
Unit Type and Rh: 5100

## 2023-10-02 LAB — MRSA NEXT GEN BY PCR, NASAL: MRSA by PCR Next Gen: NOT DETECTED

## 2023-10-02 LAB — RETICULOCYTES
Immature Retic Fract: 10.8 % (ref 2.3–15.9)
RBC.: 3.29 MIL/uL — ABNORMAL LOW (ref 4.22–5.81)
Retic Count, Absolute: 63.8 10*3/uL (ref 19.0–186.0)
Retic Ct Pct: 1.9 % (ref 0.4–3.1)

## 2023-10-02 LAB — BASIC METABOLIC PANEL WITH GFR
Anion gap: 10 (ref 5–15)
BUN: 107 mg/dL — ABNORMAL HIGH (ref 8–23)
CO2: 17 mmol/L — ABNORMAL LOW (ref 22–32)
Calcium: 8.6 mg/dL — ABNORMAL LOW (ref 8.9–10.3)
Chloride: 114 mmol/L — ABNORMAL HIGH (ref 98–111)
Creatinine, Ser: 3.9 mg/dL — ABNORMAL HIGH (ref 0.61–1.24)
GFR, Estimated: 15 mL/min — ABNORMAL LOW (ref >60)
Glucose, Bld: 108 mg/dL — ABNORMAL HIGH (ref 70–99)
Potassium: 4.3 mmol/L (ref 3.5–5.1)
Sodium: 141 mmol/L (ref 135–145)

## 2023-10-02 LAB — PROTIME-INR
INR: 1 (ref 0.8–1.2)
Prothrombin Time: 13.8 s (ref 11.4–15.2)

## 2023-10-02 LAB — HEMOGLOBIN AND HEMATOCRIT, BLOOD
HCT: 26.9 % — ABNORMAL LOW (ref 39.0–52.0)
Hemoglobin: 9.2 g/dL — ABNORMAL LOW (ref 13.0–17.0)

## 2023-10-02 LAB — IRON AND TIBC
Iron: 253 ug/dL — ABNORMAL HIGH (ref 45–182)
Saturation Ratios: 75 % — ABNORMAL HIGH (ref 17.9–39.5)
TIBC: 339 ug/dL (ref 250–450)
UIBC: 86 ug/dL

## 2023-10-02 LAB — FERRITIN: Ferritin: 40 ng/mL (ref 24–336)

## 2023-10-02 LAB — APTT: aPTT: 31 s (ref 24–36)

## 2023-10-02 MED ORDER — SODIUM CHLORIDE 0.9 % IV SOLN: INTRAVENOUS | Status: DC

## 2023-10-02 MED ORDER — TRAZODONE HCL 50 MG PO TABS
25.0000 mg | ORAL_TABLET | Freq: Once | ORAL | Status: AC
  Administered 2023-10-02: 25 mg via ORAL
  Filled 2023-10-02: qty 1

## 2023-10-02 MED ORDER — ACETAMINOPHEN 325 MG PO TABS: 650.0000 mg | ORAL_TABLET | Freq: Four times a day (QID) | ORAL | Status: DC | PRN

## 2023-10-02 MED ORDER — PANTOPRAZOLE SODIUM 40 MG PO TBEC
40.0000 mg | DELAYED_RELEASE_TABLET | Freq: Once | ORAL | Status: AC
  Administered 2023-10-02: 40 mg via ORAL
  Filled 2023-10-02: qty 1

## 2023-10-02 NOTE — Care Management Obs Status (Signed)
 MEDICARE OBSERVATION STATUS NOTIFICATION   Patient Details  Name: Gregory Nunez. MRN: [id number] Date of Birth: 05/02/1945   Medicare Observation Status Notification Given:  Yes  Pt unable to sign due to unable to see screen and poor hand coordination.  Jannine Meo, RN 10/02/2023, 9:12 AM

## 2023-10-02 NOTE — Consult Note (Signed)
 Reason for Consult: Melena and anemia Referring Physician: Jerilynn Montenegro. HPI: This is a 78 year old male with a PMH of IDA, small bowel AVMs, colonic adenomas, CKD, DM, HTN, alcohol abuse, and recent CVA admitted for severe anemia.  The patient was transferred to [facility] for further evaluation and treatment of his anemia.  The patient had routine blood work at his facility and it showed his HGB was at 5.9 g/dL.  The patient had an HGB of 8.8 g/dL at the time of D/C for his recent CVA.  With his CVA he was placed on anticoagulation, specifically Plavix .  In the hospital his HGB was at 5.0 g/dL and there was report of melenic stools at his facility.  The patient does not report any melena.  His stools were noted to be heme positive.  Over the years he had multiple endoscopic procedures with [facility].  Very small nonbleeding AVMs were noted in the upper GI tract and they were ablated with APC.  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)    Epilepsy (HCC)    GERD (gastroesophageal reflux disease)    if drinks alcohol   GI bleed 10/05/2020   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   Patient is approximately 5-week status post bunion correction left foot   Hyperlipidemia    Hypertension    Hypertensive retinopathy    OU   Hypoglycemia 10/22/2020   Malignant neoplasm  of prostate (HCC) 01/09/2014   Neuropathy    Pancreatitis    Pneumococcal vaccination administered at current visit 11/10/2020   Prostate cancer (HCC) 12/19/2013   Gleason 4+3=7, volume 31.31 cc   Rhabdomyolysis 04/12/2021   Sciatica    Shortness of breath dyspnea    with exertion    Viral hepatitis C     Past Surgical History:  Procedure Laterality Date   BIOPSY  04/16/2018   Procedure: BIOPSY;  Surgeon: Tami Falcon, MD;  Location: [facility];  Service: Endoscopy;;   BIOPSY  05/23/2020   Procedure: BIOPSY;  Surgeon: Lajuan Pila, MD;  Location: [facility];  Service: Gastroenterology;;  EGD and COLON   BIOPSY  12/09/2020   Procedure: BIOPSY;  Surgeon: Lindle Rhea, MD;  Location: [facility] ENDOSCOPY;  Service: Gastroenterology;;   biopsy on throat     hx of    CATARACT EXTRACTION Bilateral    Dr. Donovan Gallant   COLONOSCOPY N/A 05/23/2020   Procedure: COLONOSCOPY;  Surgeon: Lajuan Pila, MD;  Location: [facility];  Service: Gastroenterology;  Laterality: N/A;   ENTEROSCOPY N/A 10/07/2020   Procedure: ENTEROSCOPY;  Surgeon: Kenney Peacemaker, MD;  Location: [facility] ENDOSCOPY;  Service: Endoscopy;  Laterality: N/A;   ESOPHAGOGASTRODUODENOSCOPY Left 04/16/2018   Procedure: ESOPHAGOGASTRODUODENOSCOPY (EGD);  Surgeon: Tami Falcon, MD;  Location: [facility];  Service: Endoscopy;  Laterality: Left;   ESOPHAGOGASTRODUODENOSCOPY (EGD) WITH PROPOFOL  N/A  05/23/2020   Procedure: ESOPHAGOGASTRODUODENOSCOPY (EGD) WITH PROPOFOL ;  Surgeon: Lajuan Pila, MD;  Location: [facility];  Service: Gastroenterology;  Laterality: N/A;   ESOPHAGOGASTRODUODENOSCOPY (EGD) WITH PROPOFOL  N/A 12/09/2020   Procedure: ESOPHAGOGASTRODUODENOSCOPY (EGD) WITH PROPOFOL ;  Surgeon: Lindle Rhea, MD;  Location: [facility] END[facility]COPY;  Service: Gastroenterology;  Laterality: N/A;   EYE SURGERY     FOOT SURGERY     HOT HEMOSTASIS N/A 04/16/2018   Procedure: HOT HEMOSTASIS (ARGON PLASMA COAGULATION/BICAP);  Surgeon: Tami Falcon,  MD;  Location: [facility];  Service: Endoscopy;  Laterality: N/A;   HOT HEMOSTASIS N/A 05/23/2020   Procedure: HOT HEMOSTASIS (ARGON PLASMA COAGULATION/BICAP);  Surgeon: Lajuan Pila, MD;  Location: [facility];  Service: Gastroenterology;  Laterality: N/A;   HOT HEMOSTASIS N/A 12/09/2020   Procedure: HOT HEMOSTASIS (ARGON PLASMA COAGULATION/BICAP);  Surgeon: Lindle Rhea, MD;  Location[facility]COPY;  Service: Gastroenterology;  Laterality: N/A;   LYMPHADENECTOMY Bilateral 02/27/2014   Procedure: BILATERAL LYMPHADENECTOMY;  Surgeon: Osborn Blaze, MD;  Location: [facility];  Service: Urology;  Laterality: Bilateral;   POLYPECTOMY  05/23/2020   Procedure: POLYPECTOMY;  Surgeon: Lajuan Pila, MD;  Location: [facility];  Service: Gastroenterology;;   PROSTATE BIOPSY  12/2013   Gleason 4+3=7, volume 31.31 cc   ROBOT ASSISTED LAPAROSCOPIC RADICAL PROSTATECTOMY N/A 02/27/2014   Procedure: ROBOTIC ASSISTED LAPAROSCOPIC RADICAL PROSTATECTOMY WITH INDOCYANINE GREEN DYE;  Surgeon: Osborn Blaze, MD;  Location: [facility];  Service: Urology;  Laterality: N/A;    Family History  Problem Relation Age of Onset   Heart disease Mother    Heart attack Father 50   Cancer Sister        breast   Colon cancer Neg Hx    Esophageal cancer Neg Hx    Rectal cancer Neg Hx    Stomach cancer Neg Hx     Social History:  reports that he has quit smoking. His smoking use included cigarettes. He has never used smokeless tobacco. He reports that he does not currently use alcohol after a past usage of about 1.0 standard drink of alcohol per week. He reports that he does not currently use drugs after having used the following drugs: Marijuana, Cocaine, and Heroin.  Allergies:  Allergies  Allergen Reactions   Penicillins Other (See Comments)    Pt does not remember reaction but states he woke up in the hospital after taking      Medications: Scheduled:  amLODipine   10 mg Oral Daily   atorvastatin   80 mg Oral  Daily   feeding supplement  237 mL Oral TID BM   ferrous sulfate   325 mg Oral Q breakfast   hydrALAZINE   25 mg Oral Q8H   multivitamin  1 tablet Oral Daily   pantoprazole  (PROTONIX ) IV  40 mg Intravenous Q12H   sodium chloride  flush  3 mL Intravenous Q12H   thiamine   100 mg Oral Daily   Continuous:  Results for orders placed or performed during the hospital encounter of 10/01/23 (from the past 24 hours)  Type and screen [facility]     Status: None (Preliminary result)   Collection Time: 10/01/23  6:25 PM  Result Value Ref Range   ABO/RH(D) O POS    Antibody Screen NEG    Sample Expiration 10/04/2023,2359    Unit Number [id number]    Blood Component Type RED CELLS,LR    Unit division 00    Status of Unit ISSUED    Transfusion Status OK TO TRANSFUSE    Crossmatch Result  Compatible    Unit Number [id number]    Blood Component Type RED CELLS,LR    Unit division 00    Status of Unit ISSUED    Transfusion Status OK TO TRANSFUSE    Crossmatch Result Compatible    Unit Number [id number]    Blood Component Type RED CELLS,LR    Unit division 00    Status of Unit ISSUED    Transfusion Status OK TO TRANSFUSE    Crossmatch Result      Compatible Performed at [facility], [address], [place], [place] [address]   CBC with Differential     Status: Abnormal   Collection Time: 10/01/23  6:26 PM  Result Value Ref Range   WBC 3.9 (L) 4.0 - 10.5 K/uL   RBC 1.71 (L) 4.22 - 5.81 MIL/uL   Hemoglobin 5.0 (LL) 13.0 - 17.0 g/dL   HCT 78.4 (L) 69.6 - 29.5 %   MCV 91.8 80.0 - 100.0 fL   MCH 29.2 26.0 - 34.0 pg   MCHC 31.8 30.0 - 36.0 g/dL   RDW 28.4 13.2 - 44.0 %   Platelets 138 (L) 150 - 400 K/uL   nRBC 0.0 0.0 - 0.2 %   Neutrophils Relative % 64 %   Neutro Abs 2.5 1.7 - 7.7 K/uL   Lymphocytes Relative 19 %   Lymphs Abs 0.7 0.7 - 4.0 K/uL   Monocytes Relative 11 %   Monocytes Absolute 0.4 0.1 - 1.0 K/uL   Eosinophils Relative 5 %   Eosinophils  Absolute 0.2 0.0 - 0.5 K/uL   Basophils Relative 0 %   Basophils Absolute 0.0 0.0 - 0.1 K/uL   Immature Granulocytes 1 %   Abs Immature Granulocytes 0.03 0.00 - 0.07 K/uL  Comprehensive metabolic panel     Status: Abnormal   Collection Time: 10/01/23  6:26 PM  Result Value Ref Range   Sodium 140 135 - 145 mmol/L   Potassium 4.6 3.5 - 5.1 mmol/L   Chloride 112 (H) 98 - 111 mmol/L   CO2 19 (L) 22 - 32 mmol/L   Glucose, Bld 135 (H) 70 - 99 mg/dL   BUN 102 (H) 8 - 23 mg/dL   Creatinine, Ser 7.25 (H) 0.61 - 1.24 mg/dL   Calcium  8.4 (L) 8.9 - 10.3 mg/dL   Total Protein 6.3 (L) 6.5 - 8.1 g/dL   Albumin 2.8 (L) 3.5 - 5.0 g/dL   AST 33 15 - 41 U/L   ALT 33 0 - 44 U/L   Alkaline Phosphatase 43 38 - 126 U/L   Total Bilirubin 0.5 0.0 - 1.2 mg/dL   GFR, Estimated 14 (L) >60 mL/min   Anion gap 9 5 - 15  Prepare RBC (crossmatch)     Status: None   Collection Time: 10/01/23  7:30 PM  Result Value Ref Range   Order Confirmation      ORDER PROCESSED BY BLOOD BANK Performed at [facility], [address], [place], [place] [address]   POC occult blood, ED     Status: Abnormal   Collection Time: 10/01/23  8:31 PM  Result Value Ref Range   Fecal Occult Bld POSITIVE (A) NEGATIVE  Basic metabolic panel     Status: Abnormal   Collection Time: 10/02/23  3:56 AM  Result Value Ref Range   Sodium 141 135 - 145 mmol/L   Potassium 4.3 3.5 - 5.1 mmol/L   Chloride 114 (H) 98 - 111 mmol/L  CO2 17 (L) 22 - 32 mmol/L   Glucose, Bld 108 (H) 70 - 99 mg/dL   BUN 161 (H) 8 - 23 mg/dL   Creatinine, Ser 0.96 (H) 0.61 - 1.24 mg/dL   Calcium  8.6 (L) 8.9 - 10.3 mg/dL   GFR, Estimated 15 (L) >60 mL/min   Anion gap 10 5 - 15  Protime-INR     Status: None   Collection Time: 10/02/23  3:56 AM  Result Value Ref Range   Prothrombin Time 13.8 11.4 - 15.2 seconds   INR 1.0 0.8 - 1.2  APTT     Status: None   Collection Time: 10/02/23  3:56 AM  Result Value Ref Range   aPTT 31 24 - 36 seconds   Reticulocytes     Status: Abnormal   Collection Time: 10/02/23  3:56 AM  Result Value Ref Range   Retic Ct Pct 1.9 0.4 - 3.1 %   RBC. 3.29 (L) 4.22 - 5.81 MIL/uL   Retic Count, Absolute 63.8 19.0 - 186.0 K/uL   Immature Retic Fract 10.8 2.3 - 15.9 %  Ferritin     Status: None   Collection Time: 10/02/23  3:56 AM  Result Value Ref Range   Ferritin 40 24 - 336 ng/mL  Iron  and TIBC     Status: Abnormal   Collection Time: 10/02/23  3:56 AM  Result Value Ref Range   Iron  253 (H) 45 - 182 ug/dL   TIBC 045 409 - 811 ug/dL   Saturation Ratios 75 (H) 17.9 - 39.5 %   UIBC 86 ug/dL  CBC with Differential/Platelet     Status: Abnormal   Collection Time: 10/02/23  3:56 AM  Result Value Ref Range   WBC 5.8 4.0 - 10.5 K/uL   RBC 3.24 (L) 4.22 - 5.81 MIL/uL   Hemoglobin 9.7 (L) 13.0 - 17.0 g/dL   HCT 91.4 (L) 78.2 - 95.6 %   MCV 87.7 80.0 - 100.0 fL   MCH 29.9 26.0 - 34.0 pg   MCHC 34.2 30.0 - 36.0 g/dL   RDW 21.3 08.6 - 57.8 %   Platelets 126 (L) 150 - 400 K/uL   nRBC 0.0 0.0 - 0.2 %   Neutrophils Relative % 69 %   Neutro Abs 4.0 1.7 - 7.7 K/uL   Lymphocytes Relative 16 %   Lymphs Abs 0.9 0.7 - 4.0 K/uL   Monocytes Relative 10 %   Monocytes Absolute 0.6 0.1 - 1.0 K/uL   Eosinophils Relative 4 %   Eosinophils Absolute 0.2 0.0 - 0.5 K/uL   Basophils Relative 0 %   Basophils Absolute 0.0 0.0 - 0.1 K/uL   Immature Granulocytes 1 %   Abs Immature Granulocytes 0.07 0.00 - 0.07 K/uL     No results found.  ROS:  As stated above in the HPI otherwise negative.  Blood pressure (!) 154/91, pulse (!) 101, temperature 98 F (36.7 C), temperature source Oral, resp. rate 18, height 6' (1.829 m), weight 66.4 kg, SpO2 100%.    PE: Gen: NAD, Alert and Oriented, dysarthric HEENT:  [place]/AT, EOMI Neck: Supple, no LAD Lungs: CTA Bilaterally CV: RRR without M/G/R ABD: Soft, NTND, +BS Ext: No C/C/E  Assessment/Plan: 1) Severe anemia. 2) History of small bowel AVMs. 3) Recent CVA on  Plavix .   An enteroscopy will be scheduled for the patient, even though his Plavix  was just recently held.  This will help to maximize the yield for finding a bleeding lesion.  Plan: 1)  Enteroscopy tomorrow with possible APC.  Shania Bjelland D 10/02/2023, 11:16 AM

## 2023-10-02 NOTE — TOC Initial Note (Addendum)
 Transition of Care (TOC) - Initial/Assessment Note    Patient Details  Name: Gregory Nunez. MRN: [id number] Date of Birth: [birth year]  Transition of Care [facility]) CM/SW Contact:    Jeffory Mings, [place] Phone Number: 10/02/2023, 2:27 PM  Clinical Narrative:  Patient admitted from [facility] where he is a STR resident. Unable to reach pt's nephew to confirm dc plan, left voicemail requesting return call. Spoke to [place] with [facility] who confirmed pt able to return pending new auth. Home and Community/[employer] auth request submitted, reference # [id number]. SW will follow.   Paullette Boston, MSW, LCSW [phone] (coverage)               Expected Discharge Plan: Skilled Nursing Facility Barriers to Discharge: Insurance Authorization   Patient Goals and CMS Choice            Expected Discharge Plan and Services     Post Acute Care Choice: Skilled Nursing Facility Living arrangements for the past 2 months: Skilled Nursing Facility                                      Prior Living Arrangements/Services Living arrangements for the past 2 months: Skilled Nursing Facility Lives with:: Facility Resident Patient language and need for interpreter reviewed:: Yes        Need for Family Participation in Patient Care: Yes (Comment) Care giver support system in place?: No (comment)   Criminal Activity/Legal Involvement Pertinent to Current Situation/Hospitalization: No - Comment as needed  Activities of Daily Living   ADL Screening (condition at time of admission) Independently performs ADLs?: No Does the patient have a NEW difficulty with bathing/dressing/toileting/self-feeding that is expected to last >3 days?: No Does the patient have a NEW difficulty with getting in/out of bed, walking, or climbing stairs that is expected to last >3 days?: No Does the patient have a NEW difficulty with communication that is expected to last >3 days?: No Is  the patient deaf or have difficulty hearing?: No Does the patient have difficulty seeing, even when wearing glasses/contacts?: No Does the patient have difficulty concentrating, remembering, or making decisions?: Yes  Permission Sought/Granted                  Emotional Assessment       Orientation: : Oriented to Self, Oriented to Place Alcohol / Substance Use: Not Applicable Psych Involvement: No (comment)  Admission diagnosis:  GI bleed [K92.2] Gastrointestinal hemorrhage, unspecified gastrointestinal hemorrhage type [K92.2] Patient Active Problem List   Diagnosis Date Noted   GI bleed 10/01/2023   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   Acute cerebral infarction (HCC) 09/22/2023   Encephalopathy 09/22/2023   Aphasia 09/22/2023   Neutropenia (HCC) 09/22/2023   Chronic kidney disease (CKD), stage IV (severe) (HCC) 09/22/2023   Essential hypertension 09/22/2023   Confusion with non-focal neuro exam 08/17/2023   Chronic active hepatitis C (HCC) 11/03/2022   History of epistaxis 12/04/2021   Debility 04/15/2021   History of alcohol abuse 04/13/2021   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/12/2021   Weakness of both legs    Adenomatous duodenal polyp    AVM (arteriovenous malformation) of duodenum, acquired with hemorrhage    Acute on chronic anemia 10/22/2020   Angiodysplasia of intestine    Iron  deficiency anemia 05/22/2020   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 (HCC)  History of CVA (cerebrovascular accident) 04/23/2020   Hyperlipidemia associated with type 2 diabetes mellitus (HCC) 04/23/2020   CKD (chronic kidney disease) stage 4, GFR 15-29 ml/min (HCC) 04/23/2020   Tobacco use 04/23/2020   Seizure disorder (HCC) 11/08/2018   Hypertension associated with diabetes (HCC) 11/08/2018   Type 2 diabetes with complication (HCC) 11/08/2018   Prostate cancer (HCC) 02/27/2014   PCP:  Malen Scudder, DO Pharmacy:   [id number] [place], [place] - [address] [place] [address] Phone: [phone] Fax: [phone]     Social Drivers of Health (SDOH) Social History: SDOH Screenings   Food Insecurity: No Food Insecurity (10/02/2023)  Housing: Low Risk  (10/02/2023)  Transportation Needs: No Transportation Needs (10/02/2023)  Utilities: Not At Risk (10/02/2023)  Alcohol Screen: Low Risk  (09/21/2023)  Depression (PHQ2-9): Low Risk  (11/03/2022)  Financial Resource Strain: Low Risk  (09/21/2023)  Physical Activity: Insufficiently Active (09/21/2023)  Social Connections: Socially Isolated (09/21/2023)  Stress: No Stress Concern Present (09/21/2023)  Tobacco Use: Medium Risk (10/01/2023)  Health Literacy: Adequate Health Literacy (11/03/2022)   SDOH Interventions:     Readmission Risk Interventions    06/20/2023    2:55 PM 06/15/2023    3:00 PM  Readmission Risk Prevention Plan  Post Dischage Appt  Complete  Medication Screening  Complete  Transportation Screening Complete Complete  PCP or Specialist Appt within 5-7 Days Complete   Home Care Screening Complete   Medication Review (RN CM) Complete

## 2023-10-02 NOTE — Progress Notes (Signed)
 OT Cancellation Note  Patient Details Name: Gregory Nunez. MRN: [id number] DOB: 05-15-1945   Cancelled Treatment:    Reason Eval/Treat Not Completed: Patient at procedure or test/ unavailable  Karilyn Ouch, OTR/L [facility] Acute Rehabilitation Office: [phone]   Emery Hans 10/02/2023, 7:23 AM

## 2023-10-02 NOTE — Progress Notes (Signed)
 HD#0 SUBJECTIVE:  Patient Summary: Gregory Nunez. is a 78 y.o. man with a history of multiple CVAs (last one on 09/2023), upper GI bleed, CKD IV, T2DM, HTN, alcohol use disorder, cognitive impairment, expressive aphasia at baseline admitted for acute on chronic anemia with concern for upper GI bleed.  Overnight Events and Interim History: Admission overnight and receive 3u PRBC in setting of melena. He has no complaints this morning. No abdominal pain, N/V/D, no bowel movements since Friday and no blood or melena seen since.   OBJECTIVE:  Vital Signs: Vitals:   10/02/23 0509 10/02/23 0628 10/02/23 0715 10/02/23 0720  BP: (!) 172/112 (!) 180/90  (!) 154/91  Pulse:  97  (!) 101  Resp:  16  18  Temp:  98 F (36.7 C)  98 F (36.7 C)  TempSrc:    Oral  SpO2:  100%  100%  Weight:   66.4 kg   Height:   6' (1.829 m)    Supplemental O2: Room Air SpO2: 100 %  Filed Weights   10/02/23 0715  Weight: 66.4 kg     Intake/Output Summary (Last 24 hours) at 10/02/2023 1023 Last data filed at 10/02/2023 0503 Gross per 24 hour  Intake 1163.84 ml  Output --  Net 1163.84 ml   Net IO Since Admission: 1,163.84 mL [10/02/23 1023]  Physical Exam: Physical Exam Constitutional:      General: He is not in acute distress.    Appearance: Normal appearance. He is not ill-appearing.   Cardiovascular:     Rate and Rhythm: Normal rate.     Pulses: Normal pulses.  Pulmonary:     Effort: Pulmonary effort is normal.     Breath sounds: Normal breath sounds. No wheezing or rales.  Abdominal:     General: Abdomen is flat. There is no distension.     Tenderness: There is no abdominal tenderness. There is no guarding.   Musculoskeletal:        General: No tenderness.     Right lower leg: No edema.     Left lower leg: No edema.   Skin:    General: Skin is warm and dry.     Comments: Pale mucus membranes   Neurological:     General: No focal deficit present.     Mental Status: He is  alert and oriented to person, place, and time.   Psychiatric:        Mood and Affect: Mood normal.        Behavior: Behavior normal.    Patient Lines/Drains/Airways Status     Active Line/Drains/Airways     Name Placement date Placement time Site Days   Peripheral IV 10/01/23 20 G Anterior;Left;Upper Arm 10/01/23  1812  Arm  1   Peripheral IV 10/01/23 18 G Right Wrist 10/01/23  2050  Wrist  1   External Urinary Catheter 10/02/23  1009  --  less than 1            ASSESSMENT/PLAN:  Assessment: Principal Problem:   Acute on chronic anemia Active Problems:   Hypertension associated with diabetes (HCC)   History of CVA (cerebrovascular accident)   CKD (chronic kidney disease) stage 4, GFR 15-29 ml/min (HCC)   Iron  deficiency anemia   Angiodysplasia of intestine   AVM (arteriovenous malformation) of duodenum, acquired with hemorrhage   Anemia due to chronic kidney disease   Chronic active hepatitis C (HCC)   Aphasia   GI bleed   Dysarthria  following cerebral infarction  Gregory Nunez. is a 78 y.o. man with a history of multiple CVAs (last one on 09/2023), upper GI bleed, CKD IV, T2DM, HTN, alcohol use disorder, cognitive impairment, expressive aphasia at baseline admitted for acute on chronic anemia with concern for upper GI bleed.  Plan: Acute on chronic anemia Suspected GI bleed Iron  deficiency anemia Hx duodenal AVMs Anemia of chronic disease  GI to evaluate for EGD, thank you. Concern for upper GI bleed. This has happened before, with severe anemia in Feb of this year and bleeding AVMs in the duodenum with intervention in 2022, also prior treatment with EPO. Noteworthy he had recent stroke and discharge on DAPT, earlier this month, suggesting a hemorrhage.   Good to see that he is stable after receiving 3u PRBCs with hemoglobin now above 9, up from 5 at admission.  - Orthostatic vital signs - IV PPI protonix  40 - Clear liquid diet - GI to see today - Hold  ASA and Plavix  until Hgb normalizes - Monitor Hgb daily   CKD IV Likely secondary to uncontrolled hypertension. Baseline prior to 09/2023 hospitalization 3.4-3.6. Since, Cr ~4.30, around same today, likely progression of disease. 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 Will hold ASA and clopidogrel  in the setting of acute anemia requiring transfusions - Holding ASA and Plavix  - Will need to weigh benefits and risk of long term DAPT for this patient - Continue Atorvastatin  80 mg daily - Will consult PT and OT to continue therapy during this hospitalization - Lives at long term facility   Hepatitis C with partial treatment Advanced fibrosis per Fib-4 No documented history of cirrhosis ; has a history of alcohol use disorder, now in remission, and chronic hepatitis C. Per chart review, he received incomplete treatment with Dr. Alwin Baars in 2024. Fib4 score 3.59, suggesting advanced fibrosis. Last liver US  in 2024 without changes, however, he will likely need elastogram. No cirrhosis stigmata on physical exam.  - Consider elastogram for this patient - Will repeat Hep C RNA quant - Monitor hepatic function during hospitalization   Thrombocytopenia Has been chronic for this patient. Currently improved from 116 to 120 since discharge. Normal B12 and folate in 09/2023. Etiology may be explained for TPO decrease in setting of chronic Hep C, malnutrition, marrow supression. Low probability of HIT in this patient recently - Follow up the smear - Hep C RNA quant as above - Monitor PLT count - No need for transfusion at this time   HTN Mildly elevated today. - Have resumed Amlodipine  and hydralazine  - Continue  to hold ARB (discontinued at last hospitalization)   T2DM - Diet controlled. Last A1c in June 2025 5.1 - Does not need SSI  - Monitor on plasma labs   Protein calorie malnutrition Thiamine  deficiency - Ensure  - Thiamine  replacement   Hx of alcohol use disorder Alcohol withdrawal seizure Hx tobacco use disorder Last Keppra  dose in 2023. No recent documented seizure activity. No recent hx of alcohol use - Do not think this person needs CIWA at this time  Best Practice: Diet: Clear liquid diet VTE: SCDs Start: 10/01/23 2136 Code: Full AB: none Pain Medicine: Tylenol  prn Bowel Regimen: none Therapy Recs: PT/OT pending  DISPO: Anticipated discharge TBD.  Signature: Carleen Chary, D.O.  Internal Medicine Resident, PGY-1 [facility]  Pager: # [phone]. 10:23 AM, 10/02/2023

## 2023-10-02 NOTE — Plan of Care (Signed)

## 2023-10-02 NOTE — Evaluation (Signed)
 Occupational Therapy Evaluation Patient Details Name: Gregory Nunez. MRN: [id number] DOB: 1946/03/17 Today's Date: 10/02/2023   History of Present Illness   Pt is a 78 y.o. male presenting 6/14 from [facility] with hgb of 5.9 after 6/7 discharge with hgb of 8.8. 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 PMH significant for CKD IV, T2DM, HTN, alcohol use disorder, cognitive decline     Clinical Impressions PTA, pt recently at [facility] per chart; prior to recent admission, was independent in ADL with family assist with IADL. Upon eval, pt with dysarthric garbled speech, following one step commands with increased time and cues, and needing min A for functional mobility as well as up to mod A for ADL. Patient will benefit from continued inpatient follow up therapy, <3 hours/day      If plan is discharge home, recommend the following:   A little help with walking and/or transfers;A lot of help with bathing/dressing/bathroom;Assistance with cooking/housework;Direct supervision/assist for medications management;Assist for transportation;Direct supervision/assist for financial management;Help with stairs or ramp for entrance     Functional Status Assessment   Patient has had a recent decline in their functional status and demonstrates the ability to make significant improvements in function in a reasonable and predictable amount of time.     Equipment Recommendations   Other (comment) (defer)     Recommendations for Other Services         Precautions/Restrictions   Precautions Precautions: Fall Recall of Precautions/Restrictions: Impaired Precaution/Restrictions Comments: Watch BP     Mobility Bed Mobility Overal bed mobility: Needs Assistance Bed Mobility: Supine to Sit     Supine to sit: Min assist     General bed mobility comments: cues for guidance    Transfers Overall  transfer level: Needs assistance Equipment used: Rolling walker (2 wheels) Transfers: Sit to/from Stand, Bed to chair/wheelchair/BSC Sit to Stand: Min assist     Step pivot transfers: Min assist     General transfer comment: min A for rise/steady and min A for balance with steps toward chair as well as dense cues for problem solving      Balance Overall balance assessment: Needs assistance Sitting-balance support: Feet supported Sitting balance-Leahy Scale: Fair   Postural control: Posterior lean Standing balance support: Single extremity supported, Bilateral upper extremity supported Standing balance-Leahy Scale: Poor                             ADL either performed or assessed with clinical judgement   ADL Overall ADL's : Needs assistance/impaired Eating/Feeding: Set up;Sitting   Grooming: Set up;Sitting   Upper Body Bathing: Set up;Sitting   Lower Body Bathing: Minimal assistance;Sit to/from stand   Upper Body Dressing : Set up;Sitting   Lower Body Dressing: Minimal assistance;Sit to/from stand   Toilet Transfer: Minimal assistance;Stand-pivot   Toileting- Clothing Manipulation and Hygiene: Moderate assistance       Functional mobility during ADLs: Minimal assistance       Vision Baseline Vision/History:  (pt reports he does not wear glasses but endorses visual decline for a year or more) Additional Comments: Pt with questionably decr vision/attention on R and unable to read medicare forms brought in by CM. will continue to assess. decreased smoothness of tracking during session, increaed time to report how many fingers therapist is holding up, difficulty following commands for visual field and saccade testing  Perception         Praxis         Pertinent Vitals/Pain Pain Assessment Pain Assessment: No/denies pain     Extremity/Trunk Assessment Upper Extremity Assessment Upper Extremity Assessment: Generalized weakness;RUE  deficits/detail RUE Deficits / Details: Grip is weaker than L, but functional.  Good AROM   Lower Extremity Assessment Lower Extremity Assessment: Defer to PT evaluation   Cervical / Trunk Assessment Cervical / Trunk Assessment: Kyphotic   Communication Communication Communication: Impaired Factors Affecting Communication: Difficulty expressing self   Cognition Arousal: Alert Behavior During Therapy: WFL for tasks assessed/performed Cognition: History of cognitive impairments, Cognition impaired   Orientation impairments: Time Awareness: Intellectual awareness impaired, Online awareness impaired Memory impairment (select all impairments): Short-term memory, Working memory Attention impairment (select first level of impairment): Sustained attention Executive functioning impairment (select all impairments): Sequencing, Reasoning, Problem solving OT - Cognition Comments: Pt with dysarthria and intermittent difficulty getting words out with garbled speech. pt aware he is in the hospital. Needing up to min cues to slow speech and continue attempting to express self. Follows one step commands with 80% accuracy and increased time.                 Following commands: Impaired Following commands impaired: Follows one step commands inconsistently, Follows one step commands with increased time     Cueing  General Comments   Cueing Techniques: Verbal cues;Gestural cues;Tactile cues      Exercises     Shoulder Instructions      Home Living Family/patient expects to be discharged to:: Other (Comment) (prior to recent hospitalization, was at senior living apt per chart) Living Arrangements: Alone Available Help at Discharge: Family;Available PRN/intermittently Type of Home: Apartment Home Access: Elevator     Home Layout: One level     Bathroom Shower/Tub: Producer, television/film/video: Standard     Home Equipment: Grab bars - tub/shower;Cane - single point;Shower  seat   Additional Comments: No family present today. Pt did state he walks with a cane and per chart, his nephews assist with getting groceries. per his report, he walks to get groceries, unsure accuracy of report.  Lives With: Alone    Prior Functioning/Environment Prior Level of Function : Patient poor historian/Family not available;History of Falls (last six months)             Mobility Comments: using cane PTA, has falls at home ADLs Comments: per chart, does not drive or cook; pt with inconsistent report of whether he had started therapy at [facility] rehab    OT Problem List: Decreased strength;Decreased activity tolerance;Impaired balance (sitting and/or standing);Decreased coordination;Impaired UE functional use;Decreased safety awareness;Decreased cognition   OT Treatment/Interventions: Self-care/ADL training;Therapeutic activities;Patient/family education;DME and/or AE instruction;Balance training      OT Goals(Current goals can be found in the care plan section)   Acute Rehab OT Goals OT Goal Formulation: Patient unable to participate in goal setting Time For Goal Achievement: 10/16/23 Potential to Achieve Goals: Fair   OT Frequency:  Min 2X/week    Co-evaluation              AM-PAC OT 6 Clicks Daily Activity     Outcome Measure Help from another person eating meals?: A Little Help from another person taking care of personal grooming?: A Little Help from another person toileting, which includes using toliet, bedpan, or urinal?: A Lot Help from another person bathing (including washing, rinsing, drying)?: A Lot Help from another person  to put on and taking off regular upper body clothing?: A Little Help from another person to put on and taking off regular lower body clothing?: A Lot 6 Click Score: 15   End of Session Equipment Utilized During Treatment: Gait belt;Rolling walker (2 wheels) Nurse Communication: Mobility status  Activity Tolerance: Patient  tolerated treatment well Patient left: in chair;with call bell/phone within reach;with chair alarm set  OT Visit Diagnosis: Unsteadiness on feet (R26.81);Muscle weakness (generalized) (M62.81);Ataxia, unspecified (R27.0)                Time: 1308-6578 OT Time Calculation (min): 29 min Charges:  OT General Charges $OT Visit: 1 Visit OT Evaluation $OT Eval Moderate Complexity: 1 Mod OT Treatments $Self Care/Home Management : 8-22 mins  Karilyn Ouch, OTR/L [facility] Acute Rehabilitation Office: [phone]   Emery Hans 10/02/2023, 10:32 AM

## 2023-10-02 NOTE — Evaluation (Signed)
 Physical Therapy Evaluation Patient Details Name: Gregory Nunez. MRN: [id number] DOB: 06-03-1945 Today's Date: 10/02/2023  History of Present Illness  Pt is a 78 y.o. male presenting 6/14 from [facility] with hgb of 5.9 after 6/7 discharge with hgb of 8.8. 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 PMH significant for CKD IV, T2DM, HTN, alcohol use disorder, cognitive decline   Clinical Impression  Pt presents with condition above and deficits mentioned below, see PT Problem List. The pt recently discharged to a SNF for rehab per chart review. Currently, the pt demonstrates deficits in expressive communication, balance, activity tolerance, and power. He tends to veer/drift to the R when ambulating and needs min-modA for balance and to avoid hitting obstacles on his R. He is at high risk for falls and could benefit from short-term inpatient rehab, < 3 hours/day. Will continue to follow acutely.        If plan is discharge home, recommend the following: Assistance with feeding;Help with stairs or ramp for entrance;Assist for transportation;Assistance with cooking/housework;Direct supervision/assist for medications management;Supervision due to cognitive status;A lot of help with walking and/or transfers;A lot of help with bathing/dressing/bathroom;Direct supervision/assist for financial management   Can travel by private vehicle   Yes    Equipment Recommendations Other (comment) (defer to next venue of care)  Recommendations for Other Services       Functional Status Assessment Patient has had a recent decline in their functional status and demonstrates the ability to make significant improvements in function in a reasonable and predictable amount of time.     Precautions / Restrictions Precautions Precautions: Fall Recall of Precautions/Restrictions: Impaired Precaution/Restrictions Comments: Watch  BP Restrictions Weight Bearing Restrictions Per Provider Order: No      Mobility  Bed Mobility Overal bed mobility: Needs Assistance Bed Mobility: Sit to Supine       Sit to supine: Contact guard assist, HOB elevated   General bed mobility comments: VCs provided to return to supine, CGA for safety    Transfers Overall transfer level: Needs assistance Equipment used: Rolling walker (2 wheels) Transfers: Sit to/from Stand Sit to Stand: Min assist           General transfer comment: MinA needed for balance when standing from recliner to RW    Ambulation/Gait Ambulation/Gait assistance: Mod assist, Min assist Gait Distance (Feet): 82 Feet Assistive device: Rolling walker (2 wheels) Gait Pattern/deviations: Step-through pattern, Decreased stride length, Drifts right/left, Narrow base of support, Trunk flexed Gait velocity: reduced Gait velocity interpretation: <1.31 ft/sec, indicative of household ambulator   General Gait Details: Pt ambulates with a flexed posture and narrow stance despite cues to widen stance. Pt tends to lean and veer to the R, needing cues and modA to avoid obstacles and direct RW. Min-modA for balance throughout.  Stairs            Wheelchair Mobility     Tilt Bed    Modified Rankin (Stroke Patients Only)       Balance Overall balance assessment: Needs assistance Sitting-balance support: Feet supported Sitting balance-Leahy Scale: Fair   Postural control: Posterior lean Standing balance support: Bilateral upper extremity supported, During functional activity, Reliant on assistive device for balance Standing balance-Leahy Scale: Poor Standing balance comment: reliant on RW and min-modA  Pertinent Vitals/Pain Pain Assessment Pain Assessment: Faces Faces Pain Scale: No hurt Pain Intervention(s): Monitored during session    Home Living Family/patient expects to be discharged to:: Other  (Comment) (prior to recent hospitalization, was at senior living apt per chart) Living Arrangements: Alone Available Help at Discharge: Family;Available PRN/intermittently Type of Home: Apartment Home Access: Elevator       Home Layout: One level Home Equipment: Grab bars - tub/shower;Cane - single point;Shower seat Additional Comments: No family present today. Pt did state he walks with a cane and per chart, his nephews assist with getting groceries. per his report, he walks to get groceries, unsure accuracy of report. (per OT Eval)    Prior Function Prior Level of Function : Patient poor historian/Family not available;History of Falls (last six months)             Mobility Comments: using cane PTA, has falls at home ADLs Comments: per chart, does not drive or cook; pt with inconsistent report of whether he had started therapy at [facility] rehab     Extremity/Trunk Assessment   Upper Extremity Assessment Upper Extremity Assessment: Defer to OT evaluation    Lower Extremity Assessment Lower Extremity Assessment: Generalized weakness (noted with functional mobility, grossly >/= 4+/5 bil with MMT; unsure of sensation as pt provides inconsistent answers when questioned)    Cervical / Trunk Assessment Cervical / Trunk Assessment: Kyphotic  Communication   Communication Communication: Impaired Factors Affecting Communication: Difficulty expressing self    Cognition Arousal: Alert Behavior During Therapy: WFL for tasks assessed/performed   PT - Cognitive impairments: Difficult to assess Difficult to assess due to: Impaired communication                     PT - Cognition Comments: Difficult to assess cognition due to pt's expressive language deficits. Follows simple multi-modal cues ~80% of the time with consisency. Delayed processing noted. Following commands: Impaired Following commands impaired: Follows one step commands inconsistently, Follows one step commands  with increased time     Cueing Cueing Techniques: Verbal cues, Gestural cues, Tactile cues, Visual cues     General Comments      Exercises     Assessment/Plan    PT Assessment Patient needs continued PT services  PT Problem List Decreased strength;Decreased balance;Decreased mobility;Decreased coordination;Decreased activity tolerance;Decreased cognition       PT Treatment Interventions DME instruction;Gait training;Patient/family education;Functional mobility training;Therapeutic exercise;Therapeutic activities;Balance training;Neuromuscular re-education;Cognitive remediation    PT Goals (Current goals can be found in the Care Plan section)  Acute Rehab PT Goals Patient Stated Goal: agreeable to session, did not state specific goal PT Goal Formulation: With patient Time For Goal Achievement: 10/16/23 Potential to Achieve Goals: Fair    Frequency Min 2X/week     Co-evaluation               AM-PAC PT 6 Clicks Mobility  Outcome Measure Help needed turning from your back to your side while in a flat bed without using bedrails?: A Little Help needed moving from lying on your back to sitting on the side of a flat bed without using bedrails?: A Little Help needed moving to and from a bed to a chair (including a wheelchair)?: A Little Help needed standing up from a chair using your arms (e.g., wheelchair or bedside chair)?: A Little Help needed to walk in hospital room?: A Lot Help needed climbing 3-5 steps with a railing? : Total 6 Click Score: 15  End of Session Equipment Utilized During Treatment: Gait belt Activity Tolerance: Patient tolerated treatment well Patient left: in bed;with call bell/phone within reach;with bed alarm set Nurse Communication: Mobility status;Other (comment) (confirmed current speech is his baseline with RN) PT Visit Diagnosis: Other abnormalities of gait and mobility (R26.89);Muscle weakness (generalized) (M62.81);Other symptoms and  signs involving the nervous system (R29.898);Unsteadiness on feet (R26.81);Difficulty in walking, not elsewhere classified (R26.2)    Time: 1191-4782 PT Time Calculation (min) (ACUTE ONLY): 17 min   Charges:   PT Evaluation $PT Eval Moderate Complexity: 1 Mod   PT General Charges $$ ACUTE PT VISIT: 1 Visit         Vernida Goodie, PT, DPT Acute Rehabilitation Services  Office: [phone]   Ellyn Hack 10/02/2023, 2:12 PM

## 2023-10-02 NOTE — H&P (View-Only) (Signed)
 Reason for Consult: Melena and anemia Referring Physician: Jerilynn Montenegro. HPI: This is a 78 year old male with a PMH of IDA, small bowel AVMs, colonic adenomas, CKD, DM, HTN, alcohol abuse, and recent CVA admitted for severe anemia.  The patient was transferred to [facility] for further evaluation and treatment of his anemia.  The patient had routine blood work at his facility and it showed his HGB was at 5.9 g/dL.  The patient had an HGB of 8.8 g/dL at the time of D/C for his recent CVA.  With his CVA he was placed on anticoagulation, specifically Plavix .  In the hospital his HGB was at 5.0 g/dL and there was report of melenic stools at his facility.  The patient does not report any melena.  His stools were noted to be heme positive.  Over the years he had multiple endoscopic procedures with [facility].  Very small nonbleeding AVMs were noted in the upper GI tract and they were ablated with APC.  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)    Epilepsy (HCC)    GERD (gastroesophageal reflux disease)    if drinks alcohol   GI bleed 10/05/2020   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   Patient is approximately 5-week status post bunion correction left foot   Hyperlipidemia    Hypertension    Hypertensive retinopathy    OU   Hypoglycemia 10/22/2020   Malignant neoplasm  of prostate (HCC) 01/09/2014   Neuropathy    Pancreatitis    Pneumococcal vaccination administered at current visit 11/10/2020   Prostate cancer (HCC) 12/19/2013   Gleason 4+3=7, volume 31.31 cc   Rhabdomyolysis 04/12/2021   Sciatica    Shortness of breath dyspnea    with exertion    Viral hepatitis C     Past Surgical History:  Procedure Laterality Date   BIOPSY  04/16/2018   Procedure: BIOPSY;  Surgeon: Tami Falcon, MD;  Location: [facility];  Service: Endoscopy;;   BIOPSY  05/23/2020   Procedure: BIOPSY;  Surgeon: Lajuan Pila, MD;  Location: [facility];  Service: Gastroenterology;;  EGD and COLON   BIOPSY  12/09/2020   Procedure: BIOPSY;  Surgeon: Lindle Rhea, MD;  Location: [facility] ENDOSCOPY;  Service: Gastroenterology;;   biopsy on throat     hx of    CATARACT EXTRACTION Bilateral    Dr. Donovan Gallant   COLONOSCOPY N/A 05/23/2020   Procedure: COLONOSCOPY;  Surgeon: Lajuan Pila, MD;  Location: [facility];  Service: Gastroenterology;  Laterality: N/A;   ENTEROSCOPY N/A 10/07/2020   Procedure: ENTEROSCOPY;  Surgeon: Kenney Peacemaker, MD;  Location: [facility] ENDOSCOPY;  Service: Endoscopy;  Laterality: N/A;   ESOPHAGOGASTRODUODENOSCOPY Left 04/16/2018   Procedure: ESOPHAGOGASTRODUODENOSCOPY (EGD);  Surgeon: Tami Falcon, MD;  Location: [facility];  Service: Endoscopy;  Laterality: Left;   ESOPHAGOGASTRODUODENOSCOPY (EGD) WITH PROPOFOL  N/A  05/23/2020   Procedure: ESOPHAGOGASTRODUODENOSCOPY (EGD) WITH PROPOFOL ;  Surgeon: Lajuan Pila, MD;  Location: [facility];  Service: Gastroenterology;  Laterality: N/A;   ESOPHAGOGASTRODUODENOSCOPY (EGD) WITH PROPOFOL  N/A 12/09/2020   Procedure: ESOPHAGOGASTRODUODENOSCOPY (EGD) WITH PROPOFOL ;  Surgeon: Lindle Rhea, MD;  Location: [facility]COPY;  Service: Gastroenterology;  Laterality: N/A;   EYE SURGERY     FOOT SURGERY     HOT HEMOSTASIS N/A 04/16/2018   Procedure: HOT HEMOSTASIS (ARGON PLASMA COAGULATION/BICAP);  Surgeon: Tami Falcon,  MD;  Location: [facility];  Service: Endoscopy;  Laterality: N/A;   HOT HEMOSTASIS N/A 05/23/2020   Procedure: HOT HEMOSTASIS (ARGON PLASMA COAGULATION/BICAP);  Surgeon: Lajuan Pila, MD;  Location: [facility];  Service: Gastroenterology;  Laterality: N/A;   HOT HEMOSTASIS N/A 12/09/2020   Procedure: HOT HEMOSTASIS (ARGON PLASMA COAGULATION/BICAP);  Surgeon: Lindle Rhea, MD;  Location[facility]COPY;  Service: Gastroenterology;  Laterality: N/A;   LYMPHADENECTOMY Bilateral 02/27/2014   Procedure: BILATERAL LYMPHADENECTOMY;  Surgeon: Osborn Blaze, MD;  Location: [facility];  Service: Urology;  Laterality: Bilateral;   POLYPECTOMY  05/23/2020   Procedure: POLYPECTOMY;  Surgeon: Lajuan Pila, MD;  Location: [facility];  Service: Gastroenterology;;   PROSTATE BIOPSY  12/2013   Gleason 4+3=7, volume 31.31 cc   ROBOT ASSISTED LAPAROSCOPIC RADICAL PROSTATECTOMY N/A 02/27/2014   Procedure: ROBOTIC ASSISTED LAPAROSCOPIC RADICAL PROSTATECTOMY WITH INDOCYANINE GREEN DYE;  Surgeon: Osborn Blaze, MD;  Location: [facility];  Service: Urology;  Laterality: N/A;    Family History  Problem Relation Age of Onset   Heart disease Mother    Heart attack Father 50   Cancer Sister        breast   Colon cancer Neg Hx    Esophageal cancer Neg Hx    Rectal cancer Neg Hx    Stomach cancer Neg Hx     Social History:  reports that he has quit smoking. His smoking use included cigarettes. He has never used smokeless tobacco. He reports that he does not currently use alcohol after a past usage of about 1.0 standard drink of alcohol per week. He reports that he does not currently use drugs after having used the following drugs: Marijuana, Cocaine, and Heroin.  Allergies:  Allergies  Allergen Reactions   Penicillins Other (See Comments)    Pt does not remember reaction but states he woke up in the hospital after taking      Medications: Scheduled:  amLODipine   10 mg Oral Daily   atorvastatin   80 mg Oral  Daily   feeding supplement  237 mL Oral TID BM   ferrous sulfate   325 mg Oral Q breakfast   hydrALAZINE   25 mg Oral Q8H   multivitamin  1 tablet Oral Daily   pantoprazole  (PROTONIX ) IV  40 mg Intravenous Q12H   sodium chloride  flush  3 mL Intravenous Q12H   thiamine   100 mg Oral Daily   Continuous:  Results for orders placed or performed during the hospital encounter of 10/01/23 (from the past 24 hours)  Type and screen [facility]     Status: None (Preliminary result)   Collection Time: 10/01/23  6:25 PM  Result Value Ref Range   ABO/RH(D) O POS    Antibody Screen NEG    Sample Expiration 10/04/2023,2359    Unit Number [id number]    Blood Component Type RED CELLS,LR    Unit division 00    Status of Unit ISSUED    Transfusion Status OK TO TRANSFUSE    Crossmatch Result  Compatible    Unit Number [id number]    Blood Component Type RED CELLS,LR    Unit division 00    Status of Unit ISSUED    Transfusion Status OK TO TRANSFUSE    Crossmatch Result Compatible    Unit Number [id number]    Blood Component Type RED CELLS,LR    Unit division 00    Status of Unit ISSUED    Transfusion Status OK TO TRANSFUSE    Crossmatch Result      Compatible Performed at [facility], [address], [place], [place] [address]   CBC with Differential     Status: Abnormal   Collection Time: 10/01/23  6:26 PM  Result Value Ref Range   WBC 3.9 (L) 4.0 - 10.5 K/uL   RBC 1.71 (L) 4.22 - 5.81 MIL/uL   Hemoglobin 5.0 (LL) 13.0 - 17.0 g/dL   HCT 78.4 (L) 69.6 - 29.5 %   MCV 91.8 80.0 - 100.0 fL   MCH 29.2 26.0 - 34.0 pg   MCHC 31.8 30.0 - 36.0 g/dL   RDW 28.4 13.2 - 44.0 %   Platelets 138 (L) 150 - 400 K/uL   nRBC 0.0 0.0 - 0.2 %   Neutrophils Relative % 64 %   Neutro Abs 2.5 1.7 - 7.7 K/uL   Lymphocytes Relative 19 %   Lymphs Abs 0.7 0.7 - 4.0 K/uL   Monocytes Relative 11 %   Monocytes Absolute 0.4 0.1 - 1.0 K/uL   Eosinophils Relative 5 %   Eosinophils  Absolute 0.2 0.0 - 0.5 K/uL   Basophils Relative 0 %   Basophils Absolute 0.0 0.0 - 0.1 K/uL   Immature Granulocytes 1 %   Abs Immature Granulocytes 0.03 0.00 - 0.07 K/uL  Comprehensive metabolic panel     Status: Abnormal   Collection Time: 10/01/23  6:26 PM  Result Value Ref Range   Sodium 140 135 - 145 mmol/L   Potassium 4.6 3.5 - 5.1 mmol/L   Chloride 112 (H) 98 - 111 mmol/L   CO2 19 (L) 22 - 32 mmol/L   Glucose, Bld 135 (H) 70 - 99 mg/dL   BUN 102 (H) 8 - 23 mg/dL   Creatinine, Ser 7.25 (H) 0.61 - 1.24 mg/dL   Calcium  8.4 (L) 8.9 - 10.3 mg/dL   Total Protein 6.3 (L) 6.5 - 8.1 g/dL   Albumin 2.8 (L) 3.5 - 5.0 g/dL   AST 33 15 - 41 U/L   ALT 33 0 - 44 U/L   Alkaline Phosphatase 43 38 - 126 U/L   Total Bilirubin 0.5 0.0 - 1.2 mg/dL   GFR, Estimated 14 (L) >60 mL/min   Anion gap 9 5 - 15  Prepare RBC (crossmatch)     Status: None   Collection Time: 10/01/23  7:30 PM  Result Value Ref Range   Order Confirmation      ORDER PROCESSED BY BLOOD BANK Performed at [facility], [address], [place], [place] [address]   POC occult blood, ED     Status: Abnormal   Collection Time: 10/01/23  8:31 PM  Result Value Ref Range   Fecal Occult Bld POSITIVE (A) NEGATIVE  Basic metabolic panel     Status: Abnormal   Collection Time: 10/02/23  3:56 AM  Result Value Ref Range   Sodium 141 135 - 145 mmol/L   Potassium 4.3 3.5 - 5.1 mmol/L   Chloride 114 (H) 98 - 111 mmol/L  CO2 17 (L) 22 - 32 mmol/L   Glucose, Bld 108 (H) 70 - 99 mg/dL   BUN 161 (H) 8 - 23 mg/dL   Creatinine, Ser 0.96 (H) 0.61 - 1.24 mg/dL   Calcium  8.6 (L) 8.9 - 10.3 mg/dL   GFR, Estimated 15 (L) >60 mL/min   Anion gap 10 5 - 15  Protime-INR     Status: None   Collection Time: 10/02/23  3:56 AM  Result Value Ref Range   Prothrombin Time 13.8 11.4 - 15.2 seconds   INR 1.0 0.8 - 1.2  APTT     Status: None   Collection Time: 10/02/23  3:56 AM  Result Value Ref Range   aPTT 31 24 - 36 seconds   Reticulocytes     Status: Abnormal   Collection Time: 10/02/23  3:56 AM  Result Value Ref Range   Retic Ct Pct 1.9 0.4 - 3.1 %   RBC. 3.29 (L) 4.22 - 5.81 MIL/uL   Retic Count, Absolute 63.8 19.0 - 186.0 K/uL   Immature Retic Fract 10.8 2.3 - 15.9 %  Ferritin     Status: None   Collection Time: 10/02/23  3:56 AM  Result Value Ref Range   Ferritin 40 24 - 336 ng/mL  Iron  and TIBC     Status: Abnormal   Collection Time: 10/02/23  3:56 AM  Result Value Ref Range   Iron  253 (H) 45 - 182 ug/dL   TIBC 045 409 - 811 ug/dL   Saturation Ratios 75 (H) 17.9 - 39.5 %   UIBC 86 ug/dL  CBC with Differential/Platelet     Status: Abnormal   Collection Time: 10/02/23  3:56 AM  Result Value Ref Range   WBC 5.8 4.0 - 10.5 K/uL   RBC 3.24 (L) 4.22 - 5.81 MIL/uL   Hemoglobin 9.7 (L) 13.0 - 17.0 g/dL   HCT 91.4 (L) 78.2 - 95.6 %   MCV 87.7 80.0 - 100.0 fL   MCH 29.9 26.0 - 34.0 pg   MCHC 34.2 30.0 - 36.0 g/dL   RDW 21.3 08.6 - 57.8 %   Platelets 126 (L) 150 - 400 K/uL   nRBC 0.0 0.0 - 0.2 %   Neutrophils Relative % 69 %   Neutro Abs 4.0 1.7 - 7.7 K/uL   Lymphocytes Relative 16 %   Lymphs Abs 0.9 0.7 - 4.0 K/uL   Monocytes Relative 10 %   Monocytes Absolute 0.6 0.1 - 1.0 K/uL   Eosinophils Relative 4 %   Eosinophils Absolute 0.2 0.0 - 0.5 K/uL   Basophils Relative 0 %   Basophils Absolute 0.0 0.0 - 0.1 K/uL   Immature Granulocytes 1 %   Abs Immature Granulocytes 0.07 0.00 - 0.07 K/uL     No results found.  ROS:  As stated above in the HPI otherwise negative.  Blood pressure (!) 154/91, pulse (!) 101, temperature 98 F (36.7 C), temperature source Oral, resp. rate 18, height 6' (1.829 m), weight 66.4 kg, SpO2 100%.    PE: Gen: NAD, Alert and Oriented, dysarthric HEENT:  [place]/AT, EOMI Neck: Supple, no LAD Lungs: CTA Bilaterally CV: RRR without M/G/R ABD: Soft, NTND, +BS Ext: No C/C/E  Assessment/Plan: 1) Severe anemia. 2) History of small bowel AVMs. 3) Recent CVA on  Plavix .   An enteroscopy will be scheduled for the patient, even though his Plavix  was just recently held.  This will help to maximize the yield for finding a bleeding lesion.  Plan: 1)  Enteroscopy tomorrow with possible APC.  Shania Bjelland D 10/02/2023, 11:16 AM

## 2023-10-03 ENCOUNTER — Observation Stay (HOSPITAL_COMMUNITY): Admitting: Anesthesiology

## 2023-10-03 ENCOUNTER — Encounter (HOSPITAL_COMMUNITY)
Admission: EM | Disposition: A | Discharge status: Skilled Nursing Facility | Payer: Self-pay | Source: Home / Self Care | Attending: Emergency Medicine

## 2023-10-03 ENCOUNTER — Encounter (HOSPITAL_COMMUNITY): Payer: Self-pay | Admitting: Internal Medicine

## 2023-10-03 DIAGNOSIS — K3189 Other diseases of stomach and duodenum: Secondary | ICD-10-CM | POA: Diagnosis not present

## 2023-10-03 DIAGNOSIS — K2970 Gastritis, unspecified, without bleeding: Secondary | ICD-10-CM

## 2023-10-03 DIAGNOSIS — K269 Duodenal ulcer, unspecified as acute or chronic, without hemorrhage or perforation: Secondary | ICD-10-CM

## 2023-10-03 DIAGNOSIS — D62 Acute posthemorrhagic anemia: Secondary | ICD-10-CM | POA: Diagnosis not present

## 2023-10-03 DIAGNOSIS — K5520 Angiodysplasia of colon without hemorrhage: Secondary | ICD-10-CM

## 2023-10-03 DIAGNOSIS — K5521 Angiodysplasia of colon with hemorrhage: Secondary | ICD-10-CM

## 2023-10-03 DIAGNOSIS — K31819 Angiodysplasia of stomach and duodenum without bleeding: Secondary | ICD-10-CM | POA: Diagnosis not present

## 2023-10-03 DIAGNOSIS — R195 Other fecal abnormalities: Secondary | ICD-10-CM

## 2023-10-03 LAB — BASIC METABOLIC PANEL WITH GFR
Anion gap: 6 (ref 5–15)
BUN: 88 mg/dL — ABNORMAL HIGH (ref 8–23)
CO2: 22 mmol/L (ref 22–32)
Calcium: 8.8 mg/dL — ABNORMAL LOW (ref 8.9–10.3)
Chloride: 112 mmol/L — ABNORMAL HIGH (ref 98–111)
Creatinine, Ser: 3.9 mg/dL — ABNORMAL HIGH (ref 0.61–1.24)
GFR, Estimated: 15 mL/min — ABNORMAL LOW (ref >60)
Glucose, Bld: 86 mg/dL (ref 70–99)
Potassium: 4.4 mmol/L (ref 3.5–5.1)
Sodium: 140 mmol/L (ref 135–145)

## 2023-10-03 LAB — CBC
HCT: 26.8 % — ABNORMAL LOW (ref 39.0–52.0)
Hemoglobin: 9.1 g/dL — ABNORMAL LOW (ref 13.0–17.0)
MCH: 29.6 pg (ref 26.0–34.0)
MCHC: 34 g/dL (ref 30.0–36.0)
MCV: 87.3 fL (ref 80.0–100.0)
Platelets: 126 10*3/uL — ABNORMAL LOW (ref 150–400)
RBC: 3.07 MIL/uL — ABNORMAL LOW (ref 4.22–5.81)
RDW: 15.2 % (ref 11.5–15.5)
WBC: 5.1 10*3/uL (ref 4.0–10.5)
nRBC: 0 % (ref 0.0–0.2)

## 2023-10-03 SURGERY — ENTEROSCOPY
Anesthesia: Monitor Anesthesia Care

## 2023-10-03 MED ORDER — EPHEDRINE SULFATE-NACL 50-0.9 MG/10ML-% IV SOSY
PREFILLED_SYRINGE | INTRAVENOUS | Status: DC | PRN
  Administered 2023-10-03: 5 mg via INTRAVENOUS

## 2023-10-03 MED ORDER — HYDROMORPHONE HCL 1 MG/ML IJ SOLN: 0.2500 mg | INTRAMUSCULAR | Status: DC | PRN

## 2023-10-03 MED ORDER — TRAZODONE HCL 50 MG PO TABS
50.0000 mg | ORAL_TABLET | Freq: Every evening | ORAL | Status: DC | PRN
  Administered 2023-10-03: 50 mg via ORAL
  Filled 2023-10-03: qty 1

## 2023-10-03 MED ORDER — SODIUM CHLORIDE 0.9 % IV SOLN: 12.5000 mg | INTRAVENOUS | Status: DC | PRN

## 2023-10-03 MED ORDER — PANTOPRAZOLE SODIUM 40 MG PO TBEC
40.0000 mg | DELAYED_RELEASE_TABLET | Freq: Two times a day (BID) | ORAL | Status: DC
  Administered 2023-10-03 – 2023-10-04 (×2): 40 mg via ORAL
  Filled 2023-10-03 (×2): qty 1

## 2023-10-03 MED ORDER — OXYCODONE HCL 5 MG PO TABS: 5.0000 mg | ORAL_TABLET | Freq: Once | ORAL | Status: DC | PRN

## 2023-10-03 MED ORDER — OXYCODONE HCL 5 MG/5ML PO SOLN: 5.0000 mg | Freq: Once | ORAL | Status: DC | PRN

## 2023-10-03 MED ORDER — PROPOFOL 10 MG/ML IV BOLUS
INTRAVENOUS | Status: DC | PRN
  Administered 2023-10-03: 80 mg via INTRAVENOUS
  Administered 2023-10-03 (×4): 20 mg via INTRAVENOUS

## 2023-10-03 MED ORDER — ONDANSETRON HCL 4 MG/2ML IJ SOLN
INTRAMUSCULAR | Status: DC | PRN
  Administered 2023-10-03: 4 mg via INTRAVENOUS

## 2023-10-03 MED ORDER — ORAL CARE MOUTH RINSE: 15.0000 mL | OROMUCOSAL | Status: DC | PRN

## 2023-10-03 MED ORDER — LIDOCAINE 2% (20 MG/ML) 5 ML SYRINGE
INTRAMUSCULAR | Status: DC | PRN
  Administered 2023-10-03: 60 mg via INTRAVENOUS

## 2023-10-03 MED ORDER — PHENYLEPHRINE 80 MCG/ML (10ML) SYRINGE FOR IV PUSH (FOR BLOOD PRESSURE SUPPORT)
PREFILLED_SYRINGE | INTRAVENOUS | Status: DC | PRN
  Administered 2023-10-03 (×3): 80 ug via INTRAVENOUS

## 2023-10-03 NOTE — Transfer of Care (Signed)
 Immediate Anesthesia Transfer of Care Note  Patient: Gregory Nunez.  Procedure(s) Performed: ENTEROSCOPY EGD, WITH ARGON PLASMA COAGULATION  Patient Location: PACU  Anesthesia Type:MAC  Level of Consciousness: sedated  Airway & Oxygen  Therapy: Patient Spontanous Breathing  Post-op Assessment: Report given to RN  Post vital signs: Reviewed and stable  Last Vitals:  Vitals Value Taken Time  BP 104/66   Temp    Pulse 76 10/03/23 10:39  Resp 14 10/03/23 10:39  SpO2 100 % 10/03/23 10:39  Vitals shown include unfiled device data.  Last Pain:  Vitals:   10/03/23 0840  TempSrc: Temporal  PainSc: 0-No pain         Complications: No notable events documented.

## 2023-10-03 NOTE — TOC Progression Note (Signed)
 Transition of Care [facility]) - Progression Note    Patient Details  Name: Gregory Nunez. MRN: [id number] Date of Birth: 09-23-1945  Transition of [facility]) CM/SW Contact  Elspeth Hals, LCSW Phone Number: 10/03/2023, 2:43 PM  Clinical Narrative:    SNF auth request approved in [place]: [id number], , 3 days: 6/16-6/18.  MD aware.  CSW confirmed with [facility] that pt can return when stable.  Pt is STR at [facility].    Expected Discharge Plan: Skilled Nursing Facility Barriers to Discharge: Insurance Authorization  Expected Discharge Plan and Services     Post Acute Care Choice: Skilled Nursing Facility Living arrangements for the past 2 months: Skilled Nursing Facility                                       Social Determinants of Health (SDOH) Interventions SDOH Screenings   Food Insecurity: No Food Insecurity (10/02/2023)  Housing: Low Risk  (10/02/2023)  Transportation Needs: No Transportation Needs (10/02/2023)  Utilities: Not At Risk (10/02/2023)  Alcohol Screen: Low Risk  (09/21/2023)  Depression (PHQ2-9): Low Risk  (11/03/2022)  Financial Resource Strain: Low Risk  (09/21/2023)  Physical Activity: Insufficiently Active (09/21/2023)  Social Connections: Socially Isolated (09/21/2023)  Stress: No Stress Concern Present (09/21/2023)  Tobacco Use: Medium Risk (10/03/2023)  Health Literacy: Adequate Health Literacy (11/03/2022)    Readmission Risk Interventions    06/20/2023    2:55 PM 06/15/2023    3:00 PM  Readmission Risk Prevention Plan  Post Dischage Appt  Complete  Medication Screening  Complete  Transportation Screening Complete Complete  PCP or Specialist Appt within 5-7 Days Complete   Home Care Screening Complete   Medication Review (RN CM) Complete

## 2023-10-03 NOTE — Plan of Care (Signed)

## 2023-10-03 NOTE — Interval H&P Note (Signed)
 History and Physical Interval Note:  Transfused 3 units RBCs with posttransfusion H/H 9.1/26.8 today.  No overt bleeding reported overnight.  Iron  panel yesterday with ferritin 40, iron  253, sat 75% (collected after transfusion?).  Previously showed iron  deficiency in 05/2023 with ferritin 15, iron  35, sat 7%.  Plan for push enteroscopy today for diagnostic and therapeutic intent.  10/03/2023 9:46 AM  Gregory Nunez Gregory Nunez.  has presented today for surgery, with the diagnosis of GI bleed.  The various methods of treatment have been discussed with the patient and family. After consideration of risks, benefits and other options for treatment, the patient has consented to  Procedure(s): ENTEROSCOPY (N/A) as a surgical intervention.  The patient's history has been reviewed, patient examined, no change in status, stable for surgery.  I have reviewed the patient's chart and labs.  Questions were answered to the patient's satisfaction.     Laquetta Plank Praveen Coia

## 2023-10-03 NOTE — Progress Notes (Signed)
   10/02/23 2133  Provider Notification  Provider Name/Title Ronni Colace  Date Provider Notified 10/02/23  Time Provider Notified 2133  Method of Notification Page  Notification Reason Change in status (Patient anxious and increasingly anxious)  Provider response At bedside;See new orders  Date of Provider Response 10/02/23   Patient is increasingly anxious and restless.  He has pulled both PIV out with no recollection.  Patient has some expressive aphasia so it is difficult to completely assess orientation.  He is alert to person, place, month, and year.but no day or situation.  He felt like nobody had helped him all day.  He does have memory impairment.  He also pulled his condom cath off.  Dr. Herminio Lopes came to bedside.  Order for Trazadone ordered and given.  Also, order for PO Protonix  received and given since no current PIV.  Will have IV team restart in am and draw labs at the same time before scheduled enteroscopy.  Patient given complete bath and linen change.  Condom catheter reapplied.  Will continue to monitor patient.  Necia Bali RN

## 2023-10-03 NOTE — Anesthesia Postprocedure Evaluation (Signed)
 Anesthesia Post Note  Patient: Gregory Nunez.  Procedure(s) Performed: ENTEROSCOPY EGD, WITH ARGON PLASMA COAGULATION     Patient location during evaluation: PACU Anesthesia Type: MAC Level of consciousness: awake and alert Pain management: pain level controlled Vital Signs Assessment: post-procedure vital signs reviewed and stable Respiratory status: spontaneous breathing, nonlabored ventilation and respiratory function stable Cardiovascular status: blood pressure returned to baseline and stable Postop Assessment: no apparent nausea or vomiting Anesthetic complications: no   No notable events documented.  Last Vitals:  Vitals:   10/03/23 1108 10/03/23 1126  BP: 120/81 133/73  Pulse: 83 86  Resp: (!) 22 19  Temp:  36.8 C  SpO2: 100% 100%    Last Pain:  Vitals:   10/03/23 1126  TempSrc: Oral  PainSc:                  Earvin Goldberg

## 2023-10-03 NOTE — Anesthesia Preprocedure Evaluation (Signed)
 Anesthesia Evaluation  Patient identified by MRN, date of birth, ID band Patient awake    Reviewed: Allergy & Precautions, NPO status , Patient's Chart, lab work & pertinent test results  Airway Mallampati: II  TM Distance: >3 FB Neck ROM: Full    Dental no notable dental hx.    Pulmonary shortness of breath, Patient abstained from smoking., former smoker   Pulmonary exam normal breath sounds clear to auscultation       Cardiovascular hypertension, Pt. on medications Normal cardiovascular exam Rhythm:Regular Rate:Normal     Neuro/Psych Seizures -,    Depression    CVA    GI/Hepatic ,GERD  ,,(+)     substance abuse  alcohol use  Endo/Other  diabetes    Renal/GU Renal disease     Musculoskeletal   Abdominal   Peds  Hematology  (+) Blood dyscrasia, anemia   Anesthesia Other Findings   Reproductive/Obstetrics                             Anesthesia Physical Anesthesia Plan  ASA: 3  Anesthesia Plan: MAC   Post-op Pain Management:    Induction: Intravenous  PONV Risk Score and Plan: 1 and Propofol  infusion  Airway Management Planned: Simple Face Mask and Nasal Cannula  Additional Equipment:   Intra-op Plan:   Post-operative Plan:   Informed Consent: I have reviewed the patients History and Physical, chart, labs and discussed the procedure including the risks, benefits and alternatives for the proposed anesthesia with the patient or authorized representative who has indicated his/her understanding and acceptance.     Dental advisory given  Plan Discussed with: CRNA and Anesthesiologist  Anesthesia Plan Comments:         Anesthesia Quick Evaluation

## 2023-10-03 NOTE — Progress Notes (Addendum)
 HD#0 SUBJECTIVE:  Patient Summary: Gregory Nunez. is a 78 y.o. 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   Overnight Events and Interim History: This am push enteroscopy. Seen after procedure. He has no complaints or concerns. Denies any pain, N/V/D, reports one solid brown stool without blood or melena yesterday.  OBJECTIVE:  Vital Signs: Vitals:   10/03/23 1050 10/03/23 1100 10/03/23 1108 10/03/23 1126  BP: 97/63 102/65 120/81 133/73  Pulse: 80 78 83 86  Resp: 15 14 (!) 22 19  Temp:    98.3 F (36.8 C)  TempSrc:    Oral  SpO2: 98% 96% 100% 100%  Weight:      Height:       Supplemental O2: Room Air  Filed Weights   10/02/23 0715 10/03/23 0840  Weight: 66.4 kg 66.4 kg    Intake/Output Summary (Last 24 hours) at 10/03/2023 1157 Last data filed at 10/03/2023 0600 Gross per 24 hour  Intake 240 ml  Output 450 ml  Net -210 ml   Net IO Since Admission: 953.84 mL [10/03/23 1157]  Physical Exam: Physical Exam Constitutional:      General: He is not in acute distress.    Appearance: He is not ill-appearing.   Cardiovascular:     Rate and Rhythm: Normal rate and regular rhythm.     Pulses: Normal pulses.  Pulmonary:     Effort: Pulmonary effort is normal.     Breath sounds: Normal breath sounds. No wheezing or rales.  Abdominal:     General: Abdomen is flat.     Tenderness: There is no abdominal tenderness.   Musculoskeletal:     Right lower leg: No edema.     Left lower leg: No edema.   Skin:    General: Skin is warm and dry.   Neurological:     General: No focal deficit present.     Mental Status: He is alert and oriented to person, place, and time.   Psychiatric:        Mood and Affect: Mood normal.        Behavior:  Behavior normal.    Patient Lines/Drains/Airways Status     Active Line/Drains/Airways     Name Placement date Placement time Site Days   Peripheral IV 10/03/23 20 G 1 Right;Posterior Forearm 10/03/23  0639  Forearm  less than 1   External Urinary Catheter 10/02/23  1009  --  1            ASSESSMENT/PLAN:  Assessment: Principal Problem:   Acute on chronic anemia Active Problems:   Hypertension associated with diabetes (HCC)   History of CVA (cerebrovascular accident)   CKD (chronic kidney disease) stage 4, GFR 15-29 ml/min (HCC)   Iron  deficiency anemia   Angiodysplasia of intestine   AVM (arteriovenous malformation) of duodenum, acquired with hemorrhage   Anemia due to chronic kidney disease   Chronic active hepatitis C (HCC)   Aphasia   GI bleed   Dysarthria following cerebral infarction   Gastritis and gastroduodenitis   Duodenal erosion   AVM (arteriovenous malformation) of small bowel, acquired  Gregory Nunez. is a 78 y.o. 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   Plan: Acute on chronic anemia Suspected GI bleed Iron  deficiency anemia Hx duodenal AVMs Anemia of chronic disease   GI following, thank you. S/p push enteroscopy this am. Results were APC of four small nonbleeding small bowel AVMs and biopsy of moderate gastritis. Suspect intermittent bleeding from AVMs as cause for anemia that is exacerbated by Plavix . Plan to hold Plavix  for the next 2-3 days to allow for appropriate ulcer/eschar over APC sites. Hemodynamically stable and pt has no complaints. BM yesterday was solid and without blood. - IV PPI protonix  40 - Advance diet as tolerated - Hold ASA/plavix  3 days at least. Need to consider utility of this  medicine going forward. He had a recent stroke. - Hg goal above 7, will follow - Orthostatic vital signs - Potential discharge tomorrow if stable. Need to see if he can return to his SNF from recent discharge.   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 Holding ASA and clopidogrel  per above. - Must weigh benefits and risk of long term DAPT for this patient - Continue Atorvastatin  80 mg daily - Will consult PT and OT to continue therapy during this hospitalization - Lives at long term facility  CKD IV Likely secondary to uncontrolled hypertension. Baseline prior to 09/2023 hospitalization 3.4-3.6. Since, Cr ~4.30, around same today, likely progression of disease. Last renal US  in Feb 2025 without abnormalities - Strict In/out - Follow up U/A - Avoid nephrotoxic medications   Hepatitis C with partial treatment Advanced fibrosis per Fib-4 No documented history of cirrhosis ; has a history of alcohol use disorder, now in remission, and chronic hepatitis C. Per chart review, he received incomplete treatment with Dr. Alwin Baars in 2024. Fib4 score 3.59, suggesting advanced fibrosis. Last liver US  in 2024 without changes, however, he will likely need elastogram. No cirrhosis stigmata on physical exam.  - Consider elastogram for this patient - Follow Hep C RNA quant - Monitor hepatic function during hospitalization   HTN Stable on homeAmlodipine and hydralazine  - Continue to hold ARB (discontinued at last hospitalization)   T2DM Diet controlled. Last A1c in June 2025 5.1. Does not need SSI    Protein calorie malnutrition Thiamine  deficiency - Ensure supplement - Thiamine  replacement   Best Practice: Diet: Advance to regular VTE: SCDs Start: 10/01/23 2136 Code: Full AB: none Pain Medicine: Tylenol   prn Bowel Regimen: none Therapy Recs: PT/OT rec SNF, will return him to prior facility DISPO: Anticipated discharge tomorrow pending no further bleeding, stable anemia.  Signature: Carleen Chary, D.O.  Internal Medicine Resident, PGY-1 [facility]  Pager: # [phone]. 11:57 AM, 10/03/2023

## 2023-10-03 NOTE — Op Note (Signed)
 [facility] Patient Name: Gregory Nunez Procedure Date : 10/03/2023 MRN: [id number] Attending MD: Harry Lindau , MD, [id number] Date of Birth: 1946/03/16 CSN: [id number] Age: 78 Admit Type: Inpatient Procedure:                Small bowel enteroscopy Indications:              Acute post hemorrhagic anemia, FOBT+, History of                            arteriovenous malformation in the small intestine Providers:                Harry Lindau, MD, Ila Malay, RN, Nicki Barnacle, Technician Referring MD:              Medicines:                Monitored Anesthesia Care Complications:            No immediate complications. Estimated Blood Loss:     Estimated blood loss was minimal. Procedure:                Pre-Anesthesia Assessment:                           - Prior to the procedure, a History and Physical                            was performed, and patient medications and                            allergies were reviewed. The patient's tolerance of                            previous anesthesia was also reviewed. The risks                            and benefits of the procedure and the sedation                            options and risks were discussed with the patient.                            All questions were answered, and informed consent                            was obtained. Prior Anticoagulants: The patient has                            taken Plavix  (clopidogrel ), last dose was 2 days                            prior to procedure. ASA Grade Assessment: III - A  patient with severe systemic disease. After                            reviewing the risks and benefits, the patient was                            deemed in satisfactory condition to undergo the                            procedure.                           After obtaining informed consent, the endoscope was                            passed  under direct vision. Throughout the                            procedure, the patient's blood pressure, pulse, and                            oxygen  saturations were monitored continuously. The                            SIF-Q180 ([id number]) Olympus enteroscope was                            introduced through the mouth and advanced to the                            proximal jejunum. The small bowel enteroscopy was                            accomplished without difficulty. The patient                            tolerated the procedure well. Scope In: Scope Out: Findings:      The examined esophagus was normal.      Localized moderate inflammation characterized by congestion (edema) and       erythema was found in the cardia, in the gastric fundus, and in the       proximal gastric body. Biopsies were taken with a cold forceps for       histology. Estimated blood loss was minimal.      Minimal inflammation characterized by erythema was found in the gastric       body and in the gastric antrum.      Four angioectasias with no bleeding were found in the second portion of       the duodenum (2) and in the fourth portion of the duodenum (2).       Coagulation for hemostasis using argon plasma at 2 liters/minute and 20       watts was successful. Estimated blood loss was minimal.      There was no evidence of significant pathology in the proximal jejunum.      A single, shallow erosion with no bleeding was found  in the duodenal       bulb. There was no stigmata of bleeding to warrant additional endoscopic       intervention. Impression:               - Normal esophagus.                           - Moderate, patchy gastritis noted in the gastric                            fundus, gastric cardia, and proximal gastric body.                            This was biopsied.                           - Minimal, nonulcer gastritis noted in the                            remainder of the stomach.                            - Four non-bleeding angioectasias in the duodenum.                            Treated with argon plasma coagulation (APC).                           - The examined portion of the jejunum was normal.                           - Single small, shallow erosion in the duodenal                            bulb. There is no active bleeding or stigmata of                            recent bleeding. Recommendation:           - Return patient to hospital ward for ongoing care.                           - Advance diet as tolerated.                           - Await pathology results.                           - Use Protonix  (pantoprazole ) 40 mg PO BID for 6                            weeks, then reduce to 40 mg daily. Procedure Code(s):        --- Professional ---                           [phone], 59, Small  intestinal endoscopy, enteroscopy                            beyond second portion of duodenum, not including                            ileum; with control of bleeding (eg, injection,                            bipolar cautery, unipolar cautery, laser, heater                            probe, stapler, plasma coagulator)                           44361, Small intestinal endoscopy, enteroscopy                            beyond second portion of duodenum, not including                            ileum; with biopsy, single or multiple Diagnosis Code(s):        --- Professional ---                           K29.70, Gastritis, unspecified, without bleeding                           K31.819, Angiodysplasia of stomach and duodenum                            without bleeding                           K31.89, Other diseases of stomach and duodenum                           D62, Acute posthemorrhagic anemia CPT copyright 2022 American Medical Association. All rights reserved. The codes documented in this report are preliminary and upon coder review may  be revised to meet current  compliance requirements. Harry Lindau, MD 10/03/2023 10:58:14 AM Number of Addenda: 0

## 2023-10-04 ENCOUNTER — Ambulatory Visit: Payer: Self-pay | Admitting: Gastroenterology

## 2023-10-04 DIAGNOSIS — D62 Acute posthemorrhagic anemia: Secondary | ICD-10-CM | POA: Diagnosis not present

## 2023-10-04 LAB — CBC
HCT: 29.2 % — ABNORMAL LOW (ref 39.0–52.0)
Hemoglobin: 9.6 g/dL — ABNORMAL LOW (ref 13.0–17.0)
MCH: 29.2 pg (ref 26.0–34.0)
MCHC: 32.9 g/dL (ref 30.0–36.0)
MCV: 88.8 fL (ref 80.0–100.0)
Platelets: 138 10*3/uL — ABNORMAL LOW (ref 150–400)
RBC: 3.29 MIL/uL — ABNORMAL LOW (ref 4.22–5.81)
RDW: 14.8 % (ref 11.5–15.5)
WBC: 5 10*3/uL (ref 4.0–10.5)
nRBC: 0 % (ref 0.0–0.2)

## 2023-10-04 LAB — SURGICAL PATHOLOGY

## 2023-10-04 LAB — BASIC METABOLIC PANEL WITH GFR
Anion gap: 8 (ref 5–15)
BUN: 85 mg/dL — ABNORMAL HIGH (ref 8–23)
CO2: 20 mmol/L — ABNORMAL LOW (ref 22–32)
Calcium: 8.8 mg/dL — ABNORMAL LOW (ref 8.9–10.3)
Chloride: 112 mmol/L — ABNORMAL HIGH (ref 98–111)
Creatinine, Ser: 3.89 mg/dL — ABNORMAL HIGH (ref 0.61–1.24)
GFR, Estimated: 15 mL/min — ABNORMAL LOW (ref >60)
Glucose, Bld: 151 mg/dL — ABNORMAL HIGH (ref 70–99)
Potassium: 4.3 mmol/L (ref 3.5–5.1)
Sodium: 140 mmol/L (ref 135–145)

## 2023-10-04 LAB — HCV RNA QUANT
HCV Quantitative Log: 4.332 {Log_IU}/mL
HCV Quantitative: 21500 [IU]/mL

## 2023-10-04 MED ORDER — VITAMIN B-1 100 MG PO TABS: 100.0000 mg | ORAL_TABLET | Freq: Every day | ORAL | Status: DC

## 2023-10-04 MED ORDER — PANTOPRAZOLE SODIUM 40 MG PO TBEC: DELAYED_RELEASE_TABLET | ORAL | Status: DC

## 2023-10-04 NOTE — Progress Notes (Signed)
 Physical Therapy Treatment Patient Details Name: Gregory Nunez. MRN: [id number] DOB: 10-14-1945 Today's Date: 10/04/2023   History of Present Illness Pt is a 78 y.o. male presenting 6/14 from [facility] with hgb of 5.9 after 6/7 discharge with hgb of 8.8. 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 PMH significant for CKD IV, T2DM, HTN, alcohol use disorder, cognitive decline    PT Comments  Pt received up in chair eating lunch. Pt needed mod A to stand from recliner, later was able to stand from higher bed with min A. Worked on standing with erect posture and dynamic reaching activities that focused attention on L side as pt continues to have decreased attention to L. With gait pt hits R sided obstacles repetitively, improved with practice throughout gait. Had one LOB to L with turning R, min A to correct. Patient will benefit from continued inpatient follow up therapy, <3 hours/day.     If plan is discharge home, recommend the following: Assistance with feeding;Help with stairs or ramp for entrance;Assist for transportation;Assistance with cooking/housework;Direct supervision/assist for medications management;Supervision due to cognitive status;A lot of help with walking and/or transfers;A lot of help with bathing/dressing/bathroom;Direct supervision/assist for financial management   Can travel by private vehicle     Yes  Equipment Recommendations  Other (comment) (defer to next venue of care)    Recommendations for Other Services       Precautions / Restrictions Precautions Precautions: Fall Recall of Precautions/Restrictions: Impaired Precaution/Restrictions Comments: Watch BP Restrictions Weight Bearing Restrictions Per Provider Order: No     Mobility  Bed Mobility Overal bed mobility: Needs Assistance Bed Mobility: Sit to Supine       Sit to supine: Supervision   General bed mobility comments:  vc's for safety    Transfers Overall transfer level: Needs assistance Equipment used: Rolling walker (2 wheels) Transfers: Sit to/from Stand Sit to Stand: Min assist, Mod assist           General transfer comment: needed mod A from recliner, stood with min A from higher bed    Ambulation/Gait Ambulation/Gait assistance: Min assist Gait Distance (Feet): 200 Feet Assistive device: Rolling walker (2 wheels) Gait Pattern/deviations: Step-through pattern, Decreased stride length, Drifts right/left, Narrow base of support, Trunk flexed Gait velocity: reduced Gait velocity interpretation: <1.8 ft/sec, indicate of risk for recurrent falls   General Gait Details: continues to hit obstacles on the R, did better maintaining middle of the hallway on the way back after making a game for him to stay off the R wall. LOB to L with turning R, min A to correct   Stairs             Wheelchair Mobility     Tilt Bed    Modified Rankin (Stroke Patients Only) Modified Rankin (Stroke Patients Only) Pre-Morbid Rankin Score: Moderate disability Modified Rankin: Moderately severe disability     Balance Overall balance assessment: Needs assistance Sitting-balance support: Feet supported Sitting balance-Leahy Scale: Fair     Standing balance support: Bilateral upper extremity supported, During functional activity, Reliant on assistive device for balance Standing balance-Leahy Scale: Poor Standing balance comment: reliant on RW and min A               High Level Balance Comments: worked on attention to body position in standing as well as reaching activities. Cues for upright posture  Communication Communication Communication: Impaired Factors Affecting Communication: Difficulty expressing self  Cognition Arousal: Alert Behavior During Therapy: WFL for tasks assessed/performed   PT - Cognitive impairments: Difficult to assess Difficult to assess due to:  Impaired communication                     PT - Cognition Comments: Difficult to assess cognition due to pt's expressive language deficits. Follows simple multi-modal cues ~80% of the time with consisency. Delayed processing noted. Decreased attention to L side Following commands: Impaired Following commands impaired: Follows one step commands inconsistently, Follows one step commands with increased time    Cueing Cueing Techniques: Verbal cues, Gestural cues, Tactile cues, Visual cues  Exercises      General Comments General comments (skin integrity, edema, etc.): VSS.      Pertinent Vitals/Pain Pain Assessment Pain Assessment: No/denies pain    Home Living                          Prior Function            PT Goals (current goals can now be found in the care plan section) Acute Rehab PT Goals Patient Stated Goal: agreeable to session, did not state specific goal PT Goal Formulation: With patient Time For Goal Achievement: 10/16/23 Potential to Achieve Goals: Fair Progress towards PT goals: Progressing toward goals    Frequency    Min 2X/week      PT Plan      Co-evaluation              AM-PAC PT 6 Clicks Mobility   Outcome Measure  Help needed turning from your back to your side while in a flat bed without using bedrails?: A Little Help needed moving from lying on your back to sitting on the side of a flat bed without using bedrails?: A Little Help needed moving to and from a bed to a chair (including a wheelchair)?: A Little Help needed standing up from a chair using your arms (e.g., wheelchair or bedside chair)?: A Little Help needed to walk in hospital room?: A Lot Help needed climbing 3-5 steps with a railing? : Total 6 Click Score: 15    End of Session Equipment Utilized During Treatment: Gait belt Activity Tolerance: Patient tolerated treatment well Patient left: in bed;with call bell/phone within reach;with bed alarm  set Nurse Communication: Mobility status PT Visit Diagnosis: Other abnormalities of gait and mobility (R26.89);Muscle weakness (generalized) (M62.81);Other symptoms and signs involving the nervous system (R29.898);Unsteadiness on feet (R26.81);Difficulty in walking, not elsewhere classified (R26.2)     Time: 1610-9604 PT Time Calculation (min) (ACUTE ONLY): 16 min  Charges:    $Gait Training: 8-22 mins PT General Charges $$ ACUTE PT VISIT: 1 Visit                     Amey Ka, PT  Acute Rehab Services Secure chat preferred Office [phone]    Deloris Fetters Harrington Jobe 10/04/2023, 1:23 PM

## 2023-10-04 NOTE — Hospital Course (Signed)
 Gregory Nunez. is a 78 y.o. 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    Acute on chronic anemia Suspected GI bleed Iron  deficiency anemia Hx duodenal AVMs Anemia of chronic disease Last hospitalized for a GI bleed in 05/2023. Recent discharge on 6/5 for acute CVA for which he received DAPT. Presented to [facility] ED on 6/14 with Hg 6 at SNF. Received 3u PRBCs. Push enteroscopy revealed nonbleeding AVMs in the small intestine, treated with argon coagulation. Thereafter stable Hg above 9, tolerating a diet and twice had bowel movements without blood or melena.  The plan is to dicharge on protonix . He will take 40mg  twice daily for 6 weeks. Then, starting 7/29, he will take 40mg  protonix  daily indefinitely.  Continue to take iron  supplementation as instructed.  Given his recent stroke, he needs to complete DAPT until 3 weeks post-stroke. Discussion with GI team, plan is to resume DAPT on 6/19 and continue until 6/26. Then starting on 6/27, stop plavix  and continue aspirin  81 daily. Always take Asprin and plavix  at the same time as protonix .  He needs a CBC on 6/27. If Hg is below 9, return to the ED if he is having bloody/melenic stools. If he is not having bloody/melenic stools but Hg is below 9, contact Dr. Bonnie Butters Cirigliano's office for an appointment.   CKD IV-V This is likely his new baseline with Cr 3-4 and GFR around 15. It progressed this year with his many illnesses. Recommend monitoring of Cr, urine output. At recent hospitalization plans were made for follow-up with  K[facility]. Please ensure this is in place.   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 Anticoagulation therapy per above.   Hepatitis C with partial treatment Advanced fibrosis per Fib-4 No documented history of cirrhosis ; has a history of alcohol use disorder, now in remission, and chronic hepatitis C. Per chart review, he received incomplete treatment with Dr. Alwin Baars in 2024. Fib4 score 3.59, suggesting advanced fibrosis. Last liver US  in 2024 without changes, however, he will likely need elastogram. No cirrhosis stigmata on physical exam.  - Consider elastogram for this patient - Will repeat Hep C RNA quant - Monitor hepatic function during hospitalization   HTN Resume Amlodipine  and hydralazine . Continue to hold ARB (discontinued at last hospitalization)  Protein calorie malnutrition Thiamine  deficiency Recommend Ensure nutritional shakes twice daily and continued thiamine  replacement.

## 2023-10-04 NOTE — TOC Transition Note (Signed)
 Transition of Care [facility]) - Discharge Note   Patient Details  Name: Gregory Nunez. MRN: [id number] Date of Birth: March 22, 1946  Transition of Care [facility]) CM/SW Contact:  Tandy Fam, [place] Phone Number: 10/04/2023, 1:17 PM   Clinical Narrative:   CSW updated by MD that patient stable for discharge today, and confirmed with [facility] that they are able to admit today. CSW spoke with nephew, [name], who is in agreement. Transport arranged with [facility] for next available.  Nurse to call report to [phone], Room 114.    Final next level of care: Skilled Nursing Facility Barriers to Discharge: Barriers Resolved   Patient Goals and CMS Choice            Discharge Placement              Patient chooses bed at: [facility] Patient to be transferred to facility by: [facility] Name of family member notified: Nephew Patient and family notified of of transfer: 10/04/23  Discharge Plan and Services Additional resources added to the After Visit Summary for       Post Acute Care Choice: Skilled Nursing Facility                               Social Drivers of Health (SDOH) Interventions SDOH Screenings   Food Insecurity: No Food Insecurity (10/02/2023)  Housing: Low Risk  (10/02/2023)  Transportation Needs: No Transportation Needs (10/02/2023)  Utilities: Not At Risk (10/02/2023)  Alcohol Screen: Low Risk  (09/21/2023)  Depression (PHQ2-9): Low Risk  (11/03/2022)  Financial Resource Strain: Low Risk  (09/21/2023)  Physical Activity: Insufficiently Active (09/21/2023)  Social Connections: Socially Isolated (09/21/2023)  Stress: No Stress Concern Present (09/21/2023)  Tobacco Use: Medium Risk (10/03/2023)  Health Literacy: Adequate Health Literacy (11/03/2022)     Readmission Risk Interventions    06/20/2023    2:55 PM 06/15/2023    3:00 PM  Readmission Risk Prevention Plan  Post Dischage Appt  Complete  Medication Screening  Complete   Transportation Screening Complete Complete  PCP or Specialist Appt within 5-7 Days Complete   Home Care Screening Complete   Medication Review (RN CM) Complete

## 2023-10-04 NOTE — Progress Notes (Signed)
 Mobility Specialist Progress Note:    10/04/23 0925  Mobility  Activity Ambulated with assistance in hallway  Level of Assistance Minimal assist, patient does 75% or more  Assistive Device Front wheel walker  Distance Ambulated (ft) 100 ft  Activity Response Tolerated well  Mobility Referral Yes  Mobility visit 1 Mobility  Mobility Specialist Start Time (ACUTE ONLY) [id number]  Mobility Specialist Stop Time (ACUTE ONLY) 0904  Mobility Specialist Time Calculation (min) (ACUTE ONLY) 10 min   Received pt in bed, agreeable to mobility. Pt required MinA to EOB and stand. Required MinA and cues throughout ambulation d/t veering R. No c/o throughout and returned to room w/o fault. Pt left in chair and call bell. All needs met. Chair alarm on.   Doak Free Mobility Specialist  Please contact via [employer] or  Rehab Office [phone]

## 2023-10-04 NOTE — Progress Notes (Signed)
 Pathology results from yesterday's EGD reviewed and show erosive reactive gastropathy with features of iron  pill gastritis.  No evidence of Helicobacter pylori, metaplasia, dysplasia, malignancy.  Plan to treat with PPI as prescribed.  Patient discharging home today.   Harry Lindau, DO, [facility]

## 2023-10-04 NOTE — Discharge Summary (Signed)
 Name: Gregory Nunez. MRN: [id number] DOB: 1945/07/19 78 y.o. PCP: Malen Scudder, DO  Date of Admission: 10/01/2023  6:06 PM Date of Discharge:  10/04/23 Attending Physician: Dr. Broadus Canes  DISCHARGE DIAGNOSIS:  Primary Problem: Acute on chronic anemia   Hospital Problems: Principal Problem:   Acute on chronic anemia Active Problems:   Hypertension associated with diabetes (HCC)   History of CVA (cerebrovascular accident)   CKD (chronic kidney disease) stage 4, GFR 15-29 ml/min (HCC)   Iron  deficiency anemia   Angiodysplasia of intestine   AVM (arteriovenous malformation) of duodenum, acquired with hemorrhage   Anemia due to chronic kidney disease   Chronic active hepatitis C (HCC)   Aphasia   GI bleed   Dysarthria following cerebral infarction   Gastritis and gastroduodenitis   Duodenal erosion   AVM (arteriovenous malformation) of small bowel, acquired    DISCHARGE MEDICATIONS:   Allergies as of 10/04/2023       Reactions   Penicillins Other (See Comments)   Pt does not remember reaction but states he woke up in the hospital after taking        Medication List     PAUSE taking these medications    aspirin  EC 81 MG tablet Wait to take this until: October 06, 2023 Take 1 tablet (81 mg total) by mouth daily. Swallow whole.   clopidogrel  75 MG tablet Wait to take this until: October 06, 2023 Resume this medicine on 6/19 and then stop the medicine altogether on 6/27. The final day to receive this medicine is 6/26. Commonly known as: PLAVIX  Take 1 tablet (75 mg total) by mouth daily for 17 days.   losartan  25 MG tablet Wait to take this until your doctor or other care provider tells you to start again. Commonly known as: COZAAR  Take 1 tablet (25 mg total) by mouth daily.       TAKE these medications    amLODipine  10 MG tablet Commonly known as: NORVASC  Take 1 tablet (10 mg total) by mouth daily.   atorvastatin  80 MG tablet Commonly known as:  LIPITOR  Take 1 tablet (80 mg total) by mouth daily. What changed: when to take this   feeding supplement Liqd Take 237 mLs by mouth 3 (three) times daily between meals.   ferrous sulfate  325 (65 FE) MG tablet Take 1 tablet (325 mg total) by mouth daily with breakfast.   hydrALAZINE  25 MG tablet Commonly known as: APRESOLINE  Take 1 tablet (25 mg total) by mouth every 8 (eight) hours.   multivitamin Tabs tablet Take 1 tablet by mouth daily.   pantoprazole  40 MG tablet Commonly known as: PROTONIX  Take 40mg  (1 tablet) twice daily for 6 weeks. Then on 11/15/23, reduce dose to 40mg  (1 tablet) daily. Do this until instructed to stop.   thiamine  100 MG tablet Commonly known as: Vitamin B-1 Take 1 tablet (100 mg total) by mouth daily. Start taking on: October 05, 2023        DISPOSITION AND FOLLOW-UP:  Gregory Gumaro [place]. was discharged from [facility] in stable condition. At the hospital follow up visit please address:  Follow-up Recommendations: Consults: Needs to see Nephrology [facility]) for CKD 4/5 and would need to see Gastroenterology (Dr Harry Lindau) if evidence of recurrent GI bleed or lowering hemoglobin. Labs: CBC on 6/29 Studies: Ref to GI for endoscopy if evidence of recurrent bleed Medications:  Protonix  40 BID x 6 weeks then daily therafter  DAPT with ASA  81 and clopidogrel  75 daily staring 6/19 and ending 6/26. Then on 6/27, ASA 81 daily indefinitely. Always take aspirin  and plavix  alongside a dose of protonix .  Continue supplementation of iron  and thiamine .  Follow-up Appointments:  Contact information for after-discharge care     Destination     [facility] .   Service: Skilled Nursing Contact information: [address] [place]  [phone]                     HOSPITAL COURSE:  Patient Summary: Gregory Rothbauer. is a 78 y.o. man with a history of multiple CVAs (last  one on 09/2023), upper GI bleed, CKD IV, T2DM, HTN, alcohol use disorder, cognitive impairment, expressive aphasia at baseline admitted for acute on chronic anemia due to upper GI bleed with subsequent endoscopy and APC of duodenal AVMs.    Acute on chronic anemia Suspected GI bleed Iron  deficiency anemia Hx duodenal AVMs Anemia of chronic disease Last hospitalized for a GI bleed in 05/2023. Recent discharge on 6/5 for acute CVA for which he received DAPT. Presented to [facility] ED on 6/14 with Hg 6 at SNF. Received 3u PRBCs. Push enteroscopy revealed nonbleeding AVMs in the small intestine, treated with argon coagulation. Thereafter stable Hg above 9, tolerating a diet and twice had bowel movements without blood or melena.  The plan is to dicharge on protonix . He will take 40mg  twice daily for 6 weeks. Then, starting 7/29, he will take 40mg  protonix  daily indefinitely.  Continue to take iron  supplementation as instructed.  Given his recent stroke, he needs to complete DAPT until 3 weeks post-stroke. Discussion with GI team, plan is to resume DAPT on 6/19 and continue until 6/26. Then starting on 6/27, stop plavix  and continue aspirin  81 daily. Always take Asprin and plavix  at the same time as protonix .  He needs a CBC on 6/27. If Hg is below 9, return to the ED if he is having bloody/melenic stools. If he is not having bloody/melenic stools but Hg is below 9, contact Dr. Bonnie Butters Cirigliano's office for an appointment.   CKD IV-V This is likely his new baseline with Cr 3-4 and GFR around 15. It progressed this year with his many illnesses. Recommend monitoring of Cr, urine output. At recent hospitalization plans were made for follow-up with [facility]. Please ensure this is in place.   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  Anticoagulation therapy per above.   Hepatitis C with partial treatment Advanced fibrosis per Fib-4 No documented history of cirrhosis ; has a history of alcohol use disorder, now in remission, and chronic hepatitis C. Per chart review, he received incomplete treatment with Dr. Alwin Baars in 2024. Fib4 score 3.59, suggesting advanced fibrosis. Last liver US  in 2024 without changes, however, he will likely need elastogram. No cirrhosis stigmata on physical exam.  - Consider elastogram for this patient - Will repeat Hep C RNA quant - Monitor hepatic function during hospitalization   HTN Resume Amlodipine  and hydralazine . Continue to hold ARB (discontinued at last hospitalization)  Protein calorie malnutrition Thiamine  deficiency Recommend Ensure nutritional shakes twice daily and continued thiamine  replacement.   DISCHARGE INSTRUCTIONS:   Discharge Instructions     Call MD for:  extreme fatigue   Complete by: As directed    Call MD for:  persistant dizziness or light-headedness  Complete by: As directed    Call MD for:  persistant nausea and vomiting   Complete by: As directed    Call MD for:  severe uncontrolled pain   Complete by: As directed    Diet - low sodium heart healthy   Complete by: As directed    Discharge instructions   Complete by: As directed    You were hospitalized for a bleed in the small intestine. Fortunately, this was fixed.  Going forward, take a medicine called Protonix /pantoprazole  to protect the stomach. Take 1 tablet (40mg ) twice daily for 6 weeks. Then on 7/29, reduce to taking 1 tablet (40mg ) daily. Take this medicine at the same time that you take your daily aspirin .  Starting on 6/19, resume aspirin  and clopidogrel /plavix  once daily. Take these medicines at the same time that you take Protonix . Then, on 6/27, stop taking clopidogrel /plavix  altogether but continue your aspirin  daily.  Plan to get bloodwork 10 days from discharge, that would be 6/27.    Increase activity slowly   Complete by: As directed        SUBJECTIVE:  Feels well. No complaints. No pain, N/V/D. Recalls brown and well-formed bowel movements. Report of bloodless stool confirmed by nursing. Discharge Vitals:   BP (!) 143/65   Pulse (!) 102   Temp 98.4 F (36.9 C)   Resp 18   Ht 6' (1.829 m)   Wt 66.4 kg   SpO2 100%   BMI 19.85 kg/m   OBJECTIVE:  Physical Exam Constitutional:      General: He is not in acute distress.    Appearance: He is not ill-appearing.   Cardiovascular:     Rate and Rhythm: Normal rate and regular rhythm.     Pulses: Normal pulses.  Pulmonary:     Effort: Pulmonary effort is normal.     Breath sounds: Normal breath sounds. No wheezing or rales.  Abdominal:     General: Abdomen is flat. There is no distension.     Tenderness: There is no abdominal tenderness.   Musculoskeletal:     Right lower leg: No edema.     Left lower leg: No edema.   Neurological:     General: No focal deficit present.     Mental Status: He is alert. Mental status is at baseline.     Comments: He has stable dysarthria and moderate cognitive impairment due to his CVA/chronic vascular disease  Psychiatric:        Mood and Affect: Mood normal.        Behavior: Behavior normal.     Pertinent Labs, Studies, and Procedures:     Latest Ref Rng & Units 10/04/2023    8:26 AM 10/03/2023    5:22 AM 10/02/2023    3:21 PM  CBC  WBC 4.0 - 10.5 K/uL 5.0  5.1    Hemoglobin 13.0 - 17.0 g/dL 9.6  9.1  9.2   Hematocrit 39.0 - 52.0 % 29.2  26.8  26.9   Platelets 150 - 400 K/uL 138  126         Latest Ref Rng & Units 10/04/2023    8:26 AM 10/03/2023    5:22 AM 10/02/2023    3:56 AM  CMP  Glucose 70 - 99 mg/dL 161  86  096   BUN 8 - 23 mg/dL 85  88  045   Creatinine 0.61 - 1.24 mg/dL 4.09  8.11  9.14   Sodium 135 - 145 mmol/L 140  140  141   Potassium 3.5 - 5.1 mmol/L 4.3  4.4  4.3   Chloride 98 - 111 mmol/L 112  112  114   CO2 22 - 32 mmol/L 20  22  17     Calcium  8.9 - 10.3 mg/dL 8.8  8.8  8.6     Signed: Carleen Chary, DO Internal Medicine Resident, PGY-1 [facility]  11:19 AM, 10/04/2023

## 2023-10-05 ENCOUNTER — Encounter (HOSPITAL_COMMUNITY): Payer: Self-pay | Admitting: Gastroenterology

## 2023-10-13 ENCOUNTER — Other Ambulatory Visit: Payer: Self-pay

## 2023-10-13 ENCOUNTER — Encounter (HOSPITAL_COMMUNITY): Payer: Self-pay

## 2023-10-13 ENCOUNTER — Inpatient Hospital Stay (HOSPITAL_COMMUNITY)
Admission: EM | Admit: 2023-10-13 | Discharge: 2023-10-17 | DRG: 378 | Disposition: A | Discharge status: Skilled Nursing Facility | Source: Skilled Nursing Facility | Attending: Internal Medicine | Admitting: Internal Medicine

## 2023-10-13 DIAGNOSIS — E785 Hyperlipidemia, unspecified: Secondary | ICD-10-CM | POA: Diagnosis present

## 2023-10-13 DIAGNOSIS — E1122 Type 2 diabetes mellitus with diabetic chronic kidney disease: Secondary | ICD-10-CM | POA: Diagnosis present

## 2023-10-13 DIAGNOSIS — K31811 Angiodysplasia of stomach and duodenum with bleeding: Secondary | ICD-10-CM | POA: Diagnosis present

## 2023-10-13 DIAGNOSIS — I120 Hypertensive chronic kidney disease with stage 5 chronic kidney disease or end stage renal disease: Secondary | ICD-10-CM | POA: Diagnosis present

## 2023-10-13 DIAGNOSIS — E440 Moderate protein-calorie malnutrition: Secondary | ICD-10-CM | POA: Diagnosis present

## 2023-10-13 DIAGNOSIS — K219 Gastro-esophageal reflux disease without esophagitis: Secondary | ICD-10-CM | POA: Diagnosis present

## 2023-10-13 DIAGNOSIS — E46 Unspecified protein-calorie malnutrition: Secondary | ICD-10-CM | POA: Insufficient documentation

## 2023-10-13 DIAGNOSIS — Z789 Other specified health status: Secondary | ICD-10-CM | POA: Diagnosis present

## 2023-10-13 DIAGNOSIS — K922 Gastrointestinal hemorrhage, unspecified: Secondary | ICD-10-CM | POA: Diagnosis present

## 2023-10-13 DIAGNOSIS — F0150 Vascular dementia without behavioral disturbance: Secondary | ICD-10-CM | POA: Diagnosis present

## 2023-10-13 DIAGNOSIS — Z7902 Long term (current) use of antithrombotics/antiplatelets: Secondary | ICD-10-CM

## 2023-10-13 DIAGNOSIS — K5521 Angiodysplasia of colon with hemorrhage: Secondary | ICD-10-CM | POA: Diagnosis not present

## 2023-10-13 DIAGNOSIS — D500 Iron deficiency anemia secondary to blood loss (chronic): Secondary | ICD-10-CM | POA: Diagnosis not present

## 2023-10-13 DIAGNOSIS — D62 Acute posthemorrhagic anemia: Secondary | ICD-10-CM | POA: Diagnosis present

## 2023-10-13 DIAGNOSIS — E1159 Type 2 diabetes mellitus with other circulatory complications: Secondary | ICD-10-CM | POA: Diagnosis present

## 2023-10-13 DIAGNOSIS — D649 Anemia, unspecified: Secondary | ICD-10-CM | POA: Diagnosis present

## 2023-10-13 DIAGNOSIS — N185 Chronic kidney disease, stage 5: Secondary | ICD-10-CM | POA: Diagnosis present

## 2023-10-13 DIAGNOSIS — K3189 Other diseases of stomach and duodenum: Secondary | ICD-10-CM | POA: Diagnosis present

## 2023-10-13 DIAGNOSIS — Z8673 Personal history of transient ischemic attack (TIA), and cerebral infarction without residual deficits: Secondary | ICD-10-CM

## 2023-10-13 DIAGNOSIS — Z7982 Long term (current) use of aspirin: Secondary | ICD-10-CM

## 2023-10-13 DIAGNOSIS — I639 Cerebral infarction, unspecified: Secondary | ICD-10-CM | POA: Diagnosis present

## 2023-10-13 DIAGNOSIS — E519 Thiamine deficiency, unspecified: Secondary | ICD-10-CM | POA: Diagnosis present

## 2023-10-13 DIAGNOSIS — Z7951 Long term (current) use of inhaled steroids: Secondary | ICD-10-CM

## 2023-10-13 DIAGNOSIS — Z8249 Family history of ischemic heart disease and other diseases of the circulatory system: Secondary | ICD-10-CM

## 2023-10-13 DIAGNOSIS — G40909 Epilepsy, unspecified, not intractable, without status epilepticus: Secondary | ICD-10-CM | POA: Diagnosis present

## 2023-10-13 DIAGNOSIS — Z79899 Other long term (current) drug therapy: Secondary | ICD-10-CM

## 2023-10-13 DIAGNOSIS — I152 Hypertension secondary to endocrine disorders: Secondary | ICD-10-CM | POA: Diagnosis present

## 2023-10-13 DIAGNOSIS — F1091 Alcohol use, unspecified, in remission: Secondary | ICD-10-CM | POA: Diagnosis present

## 2023-10-13 DIAGNOSIS — I69322 Dysarthria following cerebral infarction: Secondary | ICD-10-CM

## 2023-10-13 DIAGNOSIS — E113293 Type 2 diabetes mellitus with mild nonproliferative diabetic retinopathy without macular edema, bilateral: Secondary | ICD-10-CM | POA: Diagnosis present

## 2023-10-13 DIAGNOSIS — Z880 Allergy status to penicillin: Secondary | ICD-10-CM

## 2023-10-13 DIAGNOSIS — Z8546 Personal history of malignant neoplasm of prostate: Secondary | ICD-10-CM

## 2023-10-13 DIAGNOSIS — Z87891 Personal history of nicotine dependence: Secondary | ICD-10-CM

## 2023-10-13 DIAGNOSIS — Z604 Social exclusion and rejection: Secondary | ICD-10-CM | POA: Diagnosis present

## 2023-10-13 DIAGNOSIS — D631 Anemia in chronic kidney disease: Secondary | ICD-10-CM | POA: Diagnosis present

## 2023-10-13 DIAGNOSIS — N184 Chronic kidney disease, stage 4 (severe): Secondary | ICD-10-CM | POA: Diagnosis present

## 2023-10-13 DIAGNOSIS — K5520 Angiodysplasia of colon without hemorrhage: Secondary | ICD-10-CM | POA: Diagnosis present

## 2023-10-13 DIAGNOSIS — Z681 Body mass index (BMI) 19 or less, adult: Secondary | ICD-10-CM

## 2023-10-13 LAB — TYPE AND SCREEN: Antibody Screen: NEGATIVE

## 2023-10-13 LAB — COMPREHENSIVE METABOLIC PANEL WITH GFR
ALT: 37 U/L (ref 0–44)
AST: 50 U/L — ABNORMAL HIGH (ref 15–41)
Albumin: 2.9 g/dL — ABNORMAL LOW (ref 3.5–5.0)
Alkaline Phosphatase: 52 U/L (ref 38–126)
Anion gap: 11 (ref 5–15)
BUN: 104 mg/dL — ABNORMAL HIGH (ref 8–23)
CO2: 18 mmol/L — ABNORMAL LOW (ref 22–32)
Calcium: 8.2 mg/dL — ABNORMAL LOW (ref 8.9–10.3)
Chloride: 107 mmol/L (ref 98–111)
Creatinine, Ser: 4.27 mg/dL — ABNORMAL HIGH (ref 0.61–1.24)
GFR, Estimated: 14 mL/min — ABNORMAL LOW (ref >60)
Glucose, Bld: 189 mg/dL — ABNORMAL HIGH (ref 70–99)
Potassium: 5.6 mmol/L — ABNORMAL HIGH (ref 3.5–5.1)
Sodium: 136 mmol/L (ref 135–145)
Total Bilirubin: 0.8 mg/dL (ref 0.0–1.2)
Total Protein: 6.7 g/dL (ref 6.5–8.1)

## 2023-10-13 LAB — CBC
HCT: 17.6 % — ABNORMAL LOW (ref 39.0–52.0)
Hemoglobin: 5.8 g/dL — CL (ref 13.0–17.0)
MCH: 30.7 pg (ref 26.0–34.0)
MCHC: 33 g/dL (ref 30.0–36.0)
MCV: 93.1 fL (ref 80.0–100.0)
Platelets: 161 10*3/uL (ref 150–400)
RBC: 1.89 MIL/uL — ABNORMAL LOW (ref 4.22–5.81)
RDW: 15.3 % (ref 11.5–15.5)
WBC: 5.5 10*3/uL (ref 4.0–10.5)
nRBC: 0 % (ref 0.0–0.2)

## 2023-10-13 LAB — POC OCCULT BLOOD, ED: Fecal Occult Bld: POSITIVE — AB

## 2023-10-13 LAB — PREPARE RBC (CROSSMATCH)

## 2023-10-13 MED ORDER — LACTATED RINGERS IV SOLN: INTRAVENOUS | Status: DC

## 2023-10-13 MED ORDER — PANTOPRAZOLE SODIUM 40 MG IV SOLR
80.0000 mg | Freq: Once | INTRAVENOUS | Status: AC
  Administered 2023-10-13: 80 mg via INTRAVENOUS
  Filled 2023-10-13: qty 20

## 2023-10-13 MED ORDER — SODIUM CHLORIDE 0.9% IV SOLUTION: Freq: Once | INTRAVENOUS | Status: AC

## 2023-10-13 NOTE — ED Triage Notes (Signed)
 Pt is coming from [facility] for low hgb, They report his Hgb was 6.3. Pt has no complaints.   Medic vitals   150/80 102hr 100%ra [id number]

## 2023-10-13 NOTE — Hospital Course (Addendum)
 Gregory Nunez. is a 78 y.o.male with history of CKD IV/V, ischemic CVA (infarct in the L thalamus and parietal white matter 09/2023) with residual dysarthria, HTN, cognitive impairment who is being brought in from [facility] after Hgb was found to be 6.3. Admitted for acute on chronic anemia 2/2 GI bleed.   Acute on chronic anemia UGI Bleed Hx of Duodenal AVM's Anemia of Chronic Disease IDA Hgb 5.8 on admission and melena. He received 2u PRBCs. This was a recurrent bleed from earlier this month. Unfortunately his recent stroke with need of DAPT is contributing. Enteroscopy revealed 6 non-bleeding AVMs in the small bowel. He will need PPI scheduled *** going forward and GI intends octreotide depot as he will need ongoing plavix  for his CVAs.  CKD stage IV/V Elevated Creatinine Mild elevation in creatinine though interpretation limited given advanced CKD. This may reflect new baseline. - He needs outpatient Nephrology follow-up   History of CVA (L infarct in the L thalamus and parietal white matter 09/2023) HLD Cognitive Impairment Cognitive impairment which has been attributed to chronic vascular disease further c/b recent stroke.  He appears at baseline. -Resume home Lipitor  80 mg   Protein calorie malnutrition Thiamine  deficiency -Resume home thiamine  -[employer]

## 2023-10-13 NOTE — ED Provider Notes (Signed)
 [facility] DEPT [facility] Emergency Department Provider Note MRN:  [id number]  Arrival date & time: 10/13/23     Chief Complaint   abnormal labs    History of Present Illness   Gregory Nunez. is a 78 y.o. year-old male with a history of CKD, stroke, diabetes presenting to the ED with chief complaint of abnormal lab.  Patient sent here for abnormal labs, low hemoglobin.  Patient is without complaints, denies pain, no bleeding sources.  Review of Systems  A thorough review of systems was obtained and all systems are negative except as noted in the HPI and PMH.   Patient's Health History    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)    Epilepsy (HCC)    GERD (gastroesophageal reflux disease)    if drinks alcohol   GI bleed 10/05/2020   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   Patient is approximately 5-week status post bunion correction left foot   Hyperlipidemia    Hypertension    Hypertensive retinopathy    OU   Hypoglycemia 10/22/2020   Malignant neoplasm of prostate (HCC) 01/09/2014   Neuropathy    Pancreatitis    Pneumococcal vaccination administered at current visit 11/10/2020   Prostate cancer (HCC) 12/19/2013   Gleason 4+3=7, volume 31.31 cc   Rhabdomyolysis 04/12/2021   Sciatica    Shortness of breath dyspnea    with  exertion    Viral hepatitis C     Past Surgical History:  Procedure Laterality Date   BIOPSY  04/16/2018   Procedure: BIOPSY;  Surgeon: Kristie Lamprey, MD;  Location: [facility]OS[facility];  Service: Endoscopy;;   BIOPSY  05/23/2020   Procedure: BIOPSY;  Surgeon: Charlanne Groom, MD;  Location: [facility];  Service: Gastroenterology;;  EGD and COLON   BIOPSY  12/09/2020   Procedure: BIOPSY;  Surgeon: Eda Iha, MD;  Location: [facility] ENDOSCOPY;  Service: Gastroenterology;;   biopsy on throat     hx of    CATARACT EXTRACTION Bilateral    Dr. Medford Ferrier   COLONOSCOPY N/A 05/23/2020   Procedure: COLONOSCOPY;  Surgeon: Charlanne Groom, MD;  Location: [facility]OSCOPY;  Service: Gastroenterology;  Laterality: N/A;   ENTEROSCOPY N/A 10/07/2020   Procedure: ENTEROSCOPY;  Surgeon: Avram Lupita BRAVO, MD;  Location: [facility] ENDOSCOPY;  Service: Endoscopy;  Laterality: N/A;   ENTEROSCOPY N/A 10/03/2023   Procedure: ENTEROSCOPY;  Surgeon: San Sandor GAILS, DO;  Location: [facility];  Service: Gastroenterology;  Laterality: N/A;   ESOPHAGOGASTRODUODENOSCOPY Left 04/16/2018   Procedure: ESOPHAGOGASTRODUODENOSCOPY (EGD);  Surgeon: Kristie Lamprey, MD;  Location: [facility];  Service: Endoscopy;  Laterality: Left;   ESOPHAGOGASTRODUODENOSCOPY (EGD) WITH PROPOFOL  N/A 05/23/2020   Procedure: ESOPHAGOGASTRODUODENOSCOPY (EGD) WITH PROPOFOL ;  Surgeon: Charlanne Groom, MD;  Location: [facility];  Service: Gastroenterology;  Laterality: N/A;   ESOPHAGOGASTRODUODENOSCOPY (EGD) WITH PROPOFOL  N/A 12/09/2020   Procedure: ESOPHAGOGASTRODUODENOSCOPY (  EGD) WITH PROPOFOL ;  Surgeon: Eda Iha, MD;  Location: [facility] ENDOSCOPY;  Service: Gastroenterology;  Laterality: N/A;   EYE SURGERY     FOOT SURGERY     HOT HEMOSTASIS N/A 04/16/2018   Procedure: HOT HEMOSTASIS (ARGON PLASMA COAGULATION/BICAP);  Surgeon: Kristie Lamprey, MD;  Location: [facility];  Service: Endoscopy;  Laterality: N/A;   HOT HEMOSTASIS N/A 05/23/2020   Procedure: HOT  HEMOSTASIS (ARGON PLASMA COAGULATION/BICAP);  Surgeon: Charlanne Groom, MD;  Location: [facility];  Service: Gastroenterology;  Laterality: N/A;   HOT HEMOSTASIS N/A 12/09/2020   Procedure: HOT HEMOSTASIS (ARGON PLASMA COAGULATION/BICAP);  Surgeon: Eda Iha, MD;  Location[facility] ENDOSCOPY;  Service: Gastroenterology;  Laterality: N/A;   HOT HEMOSTASIS N/A 10/03/2023   Procedure: EGD, WITH ARGON PLASMA COAGULATION;  Surgeon: San Sandor GAILS, DO;  Location: [facility];  Service: Gastroenterology;  Laterality: N/A;   LYMPHADENECTOMY Bilateral 02/27/2014   Procedure: BILATERAL LYMPHADENECTOMY;  Surgeon: Ricardo Likens, MD;  Location: [facility];  Service: Urology;  Laterality: Bilateral;   POLYPECTOMY  05/23/2020   Procedure: POLYPECTOMY;  Surgeon: Charlanne Groom, MD;  Location: [facility] ENDOS[facility];  Service: Gastroenterology;;   PROSTATE BIOPSY  12/2013   Gleason 4+3=7, volume 31.31 cc   ROBOT ASSISTED LAPAROSCOPIC RADICAL PROSTATECTOMY N/A 02/27/2014   Procedure: ROBOTIC ASSISTED LAPAROSCOPIC RADICAL PROSTATECTOMY WITH INDOCYANINE GREEN DYE;  Surgeon: Ricardo Likens, MD;  Location: [facility];  Service: Urology;  Laterality: N/A;    Family History  Problem Relation Age of Onset   Heart disease Mother    Heart attack Father 2   Cancer Sister        breast   Colon cancer Neg Hx    Esophageal cancer Neg Hx    Rectal cancer Neg Hx    Stomach cancer Neg Hx     Social History   Socioeconomic History   Marital status: Married    Spouse name: Not on file   Number of children: Not on file   Years of education: Not on file   Highest education level: Not on file  Occupational History   Not on file  Tobacco Use   Smoking status: Former    Current packs/day: 0.50    Types: Cigarettes   Smokeless tobacco: Never   Tobacco comments:    1 ppd Wants Patches .  Stopped 1.5 weeks ago   Vaping Use   Vaping status: Never Used  Substance and Sexual Activity   Alcohol use: Not Currently    Alcohol/week:  1.0 standard drink of alcohol    Types: 1 Cans of beer per week   Drug use: Not Currently    Types: Marijuana, Cocaine, Heroin    Comment: past hx approx 30 years ago    Sexual activity: Not on file  Other Topics Concern   Not on file  Social History Narrative   Not on file   Social Drivers of Health   Financial Resource Strain: Low Risk  (09/21/2023)   Overall Financial Resource Strain (CARDIA)    Difficulty of Paying Living Expenses: Not hard at all  Food Insecurity: No Food Insecurity (10/02/2023)   Hunger Vital Sign    Worried About Running Out of Food in the Last Year: Never true    Ran Out of Food in the Last Year: Never true  Transportation Needs: No Transportation Needs (10/02/2023)   PRAPARE - Administrator, Civil Service (Medical): No    Lack of Transportation (Non-Medical): No  Physical Activity: Insufficiently Active (09/21/2023)  Exercise Vital Sign    Days of Exercise per Week: 2 days    Minutes of Exercise per Session: 30 min  Stress: No Stress Concern Present (09/21/2023)   [employer] of Occupational Health - Occupational Stress Questionnaire    Feeling of Stress : Not at all  Social Connections: Socially Isolated (09/21/2023)   Social Connection and Isolation Panel    Frequency of Communication with Friends and Family: Three times a week    Frequency of Social Gatherings with Friends and Family: Once a week    Attends Religious Services: Never    Database administrator or Organizations: No    Attends Banker Meetings: Never    Marital Status: Divorced  Catering manager Violence: Not At Risk (10/02/2023)   Humiliation, Afraid, Rape, and Kick questionnaire    Fear of Current or Ex-Partner: No    Emotionally Abused: No    Physically Abused: No    Sexually Abused: No     Physical Exam   Vitals:   10/13/23 2200  BP: (!) 145/67  Pulse: (!) 103  Resp: 18  Temp: 98.1 F (36.7 C)  SpO2: 100%    CONSTITUTIONAL: Well-appearing,  NAD NEURO/PSYCH:  Alert and oriented x 3, no focal deficits EYES:  eyes equal and reactive ENT/NECK:  no LAD, no JVD CARDIO: Regular rate, well-perfused, normal S1 and S2 PULM:  CTAB no wheezing or rhonchi GI/GU:  non-distended, non-tender MSK/SPINE:  No gross deformities, no edema SKIN:  no rash, atraumatic   *Additional and/or pertinent findings included in MDM below  Diagnostic and Interventional Summary    EKG Interpretation Date/Time:    Ventricular Rate:    PR Interval:    QRS Duration:    QT Interval:    QTC Calculation:   R Axis:      Text Interpretation:         Labs Reviewed  COMPREHENSIVE METABOLIC PANEL WITH GFR - Abnormal; Notable for the following components:      Result Value   Potassium 5.6 (*)    CO2 18 (*)    Glucose, Bld 189 (*)    BUN 104 (*)    Creatinine, Ser 4.27 (*)    Calcium  8.2 (*)    Albumin 2.9 (*)    AST 50 (*)    GFR, Estimated 14 (*)    All other components within normal limits  CBC - Abnormal; Notable for the following components:   RBC 1.89 (*)    Hemoglobin 5.8 (*)    HCT 17.6 (*)    All other components within normal limits  POC OCCULT BLOOD, ED - Abnormal; Notable for the following components:   Fecal Occult Bld POSITIVE (*)    All other components within normal limits  TYPE AND SCREEN  PREPARE RBC (CROSSMATCH)    No orders to display    Medications  0.9 %  sodium chloride  infusion (Manually program via Guardrails IV Fluids) (has no administration in time range)  lactated ringers  infusion (has no administration in time range)  pantoprazole  (PROTONIX ) injection 80 mg (80 mg Intravenous Given 10/13/23 2333)     Procedures  /  Critical Care .Critical Care  Performed by: Theadore Ozell HERO, MD Authorized by: Theadore Ozell HERO, MD   Critical care provider statement:    Critical care time (minutes):  35   Critical care was necessary to treat or prevent imminent or life-threatening deterioration of the following conditions:  anemia requiring blood transfusion.  Critical care was time spent personally by me on the following activities:  Development of treatment plan with patient or surrogate, discussions with consultants, evaluation of patient's response to treatment, examination of patient, ordering and review of laboratory studies, ordering and review of radiographic studies, ordering and performing treatments and interventions, pulse oximetry, re-evaluation of patient's condition and review of old charts   ED Course and Medical Decision Making  Initial Impression and Ddx Downtrending hemoglobin however asymptomatic with reassuring vital signs.  Very well-appearing.  Pretty steep drop from just 9 days ago hemoglobin of greater than 9 and now hemoglobin less than 6.  Has a history of iron  deficiency anemia but this drop would suggest active bleeding.  Rectal exam reveals obvious melanotic stool, Hemoccult confirms GI bleeding.  Providing Protonix , blood transfusion, will need admission.  Past medical/surgical history that increases complexity of ED encounter: CKD, stroke  Interpretation of Diagnostics I personally reviewed the Laboratory Testing and my interpretation is as follows: Hemoglobin drop as described above, also mild increase in creatinine    Patient Reassessment and Ultimate Disposition/Management     Will admit to internal medicine service.  Patient management required discussion with the following services or consulting groups:  Hospitalist Service  Complexity of Problems Addressed Acute illness or injury that poses threat of life of bodily function  Additional Data Reviewed and Analyzed Further history obtained from: Prior labs/imaging results  Additional Factors Impacting ED Encounter Risk Consideration of hospitalization  Ozell HERO. Theadore, MD [facility] Emergency Medicine [facility] [email or web address]  Final Clinical Impressions(s) / ED Diagnoses     ICD-10-CM    1. Blood loss anemia  D50.0     2. Gastrointestinal hemorrhage, unspecified gastrointestinal hemorrhage type  K92.2       ED Discharge Orders     None        Discharge Instructions Discussed with and Provided to Patient:   Discharge Instructions   None      Theadore Ozell HERO, MD 10/13/23 2339

## 2023-10-14 ENCOUNTER — Encounter (HOSPITAL_COMMUNITY)
Admission: EM | Disposition: A | Discharge status: Skilled Nursing Facility | Payer: Self-pay | Source: Skilled Nursing Facility | Attending: Infectious Diseases

## 2023-10-14 ENCOUNTER — Observation Stay (HOSPITAL_COMMUNITY): Admitting: Anesthesiology

## 2023-10-14 DIAGNOSIS — D6489 Other specified anemias: Secondary | ICD-10-CM | POA: Diagnosis not present

## 2023-10-14 DIAGNOSIS — K922 Gastrointestinal hemorrhage, unspecified: Secondary | ICD-10-CM

## 2023-10-14 DIAGNOSIS — D638 Anemia in other chronic diseases classified elsewhere: Secondary | ICD-10-CM | POA: Diagnosis not present

## 2023-10-14 DIAGNOSIS — K3189 Other diseases of stomach and duodenum: Secondary | ICD-10-CM

## 2023-10-14 DIAGNOSIS — D509 Iron deficiency anemia, unspecified: Secondary | ICD-10-CM

## 2023-10-14 DIAGNOSIS — Z8673 Personal history of transient ischemic attack (TIA), and cerebral infarction without residual deficits: Secondary | ICD-10-CM

## 2023-10-14 DIAGNOSIS — N185 Chronic kidney disease, stage 5: Secondary | ICD-10-CM | POA: Diagnosis not present

## 2023-10-14 DIAGNOSIS — N184 Chronic kidney disease, stage 4 (severe): Secondary | ICD-10-CM

## 2023-10-14 DIAGNOSIS — E43 Unspecified severe protein-calorie malnutrition: Secondary | ICD-10-CM

## 2023-10-14 DIAGNOSIS — E785 Hyperlipidemia, unspecified: Secondary | ICD-10-CM

## 2023-10-14 DIAGNOSIS — Z87891 Personal history of nicotine dependence: Secondary | ICD-10-CM

## 2023-10-14 DIAGNOSIS — K31819 Angiodysplasia of stomach and duodenum without bleeding: Secondary | ICD-10-CM | POA: Diagnosis not present

## 2023-10-14 DIAGNOSIS — E46 Unspecified protein-calorie malnutrition: Secondary | ICD-10-CM | POA: Diagnosis not present

## 2023-10-14 DIAGNOSIS — I129 Hypertensive chronic kidney disease with stage 1 through stage 4 chronic kidney disease, or unspecified chronic kidney disease: Secondary | ICD-10-CM | POA: Diagnosis not present

## 2023-10-14 DIAGNOSIS — K921 Melena: Secondary | ICD-10-CM | POA: Diagnosis not present

## 2023-10-14 DIAGNOSIS — D62 Acute posthemorrhagic anemia: Secondary | ICD-10-CM | POA: Diagnosis not present

## 2023-10-14 DIAGNOSIS — E519 Thiamine deficiency, unspecified: Secondary | ICD-10-CM

## 2023-10-14 DIAGNOSIS — I10 Essential (primary) hypertension: Secondary | ICD-10-CM | POA: Diagnosis not present

## 2023-10-14 DIAGNOSIS — R7989 Other specified abnormal findings of blood chemistry: Secondary | ICD-10-CM

## 2023-10-14 DIAGNOSIS — Z7902 Long term (current) use of antithrombotics/antiplatelets: Secondary | ICD-10-CM

## 2023-10-14 DIAGNOSIS — G3184 Mild cognitive impairment, so stated: Secondary | ICD-10-CM

## 2023-10-14 DIAGNOSIS — E119 Type 2 diabetes mellitus without complications: Secondary | ICD-10-CM

## 2023-10-14 LAB — CBC
HCT: 23.4 % — ABNORMAL LOW (ref 39.0–52.0)
Hemoglobin: 7.8 g/dL — ABNORMAL LOW (ref 13.0–17.0)
MCH: 30.2 pg (ref 26.0–34.0)
MCHC: 33.3 g/dL (ref 30.0–36.0)
MCV: 90.7 fL (ref 80.0–100.0)
Platelets: 141 10*3/uL — ABNORMAL LOW (ref 150–400)
RBC: 2.58 MIL/uL — ABNORMAL LOW (ref 4.22–5.81)
RDW: 14.9 % (ref 11.5–15.5)
WBC: 5 10*3/uL (ref 4.0–10.5)
nRBC: 0 % (ref 0.0–0.2)

## 2023-10-14 LAB — RENAL FUNCTION PANEL
Albumin: 2.8 g/dL — ABNORMAL LOW (ref 3.5–5.0)
Albumin: 2.7 g/dL — ABNORMAL LOW (ref 3.5–5.0)
Albumin: 2.7 g/dL — ABNORMAL LOW (ref 3.5–5.0)
Anion gap: 15 (ref 5–15)
Anion gap: 9 (ref 5–15)
Anion gap: 13 (ref 5–15)
BUN: 105 mg/dL — ABNORMAL HIGH (ref 8–23)
BUN: 102 mg/dL — ABNORMAL HIGH (ref 8–23)
BUN: 94 mg/dL — ABNORMAL HIGH (ref 8–23)
CO2: 15 mmol/L — ABNORMAL LOW (ref 22–32)
CO2: 19 mmol/L — ABNORMAL LOW (ref 22–32)
CO2: 20 mmol/L — ABNORMAL LOW (ref 22–32)
Calcium: 8.3 mg/dL — ABNORMAL LOW (ref 8.9–10.3)
Calcium: 8.4 mg/dL — ABNORMAL LOW (ref 8.9–10.3)
Calcium: 8.7 mg/dL — ABNORMAL LOW (ref 8.9–10.3)
Chloride: 107 mmol/L (ref 98–111)
Chloride: 111 mmol/L (ref 98–111)
Chloride: 109 mmol/L (ref 98–111)
Creatinine, Ser: 4.19 mg/dL — ABNORMAL HIGH (ref 0.61–1.24)
Creatinine, Ser: 4.31 mg/dL — ABNORMAL HIGH (ref 0.61–1.24)
Creatinine, Ser: 4.14 mg/dL — ABNORMAL HIGH (ref 0.61–1.24)
GFR, Estimated: 14 mL/min — ABNORMAL LOW (ref >60)
GFR, Estimated: 14 mL/min — ABNORMAL LOW (ref >60)
GFR, Estimated: 14 mL/min — ABNORMAL LOW (ref >60)
Glucose, Bld: 188 mg/dL — ABNORMAL HIGH (ref 70–99)
Glucose, Bld: 153 mg/dL — ABNORMAL HIGH (ref 70–99)
Glucose, Bld: 79 mg/dL (ref 70–99)
Phosphorus: 4.5 mg/dL (ref 2.5–4.6)
Phosphorus: 4.3 mg/dL (ref 2.5–4.6)
Phosphorus: 5.1 mg/dL — ABNORMAL HIGH (ref 2.5–4.6)
Potassium: 5.6 mmol/L — ABNORMAL HIGH (ref 3.5–5.1)
Potassium: 4.4 mmol/L (ref 3.5–5.1)
Potassium: 4 mmol/L (ref 3.5–5.1)
Sodium: 137 mmol/L (ref 135–145)
Sodium: 139 mmol/L (ref 135–145)
Sodium: 142 mmol/L (ref 135–145)

## 2023-10-14 SURGERY — ENTEROSCOPY
Anesthesia: Monitor Anesthesia Care

## 2023-10-14 MED ORDER — THIAMINE MONONITRATE 100 MG PO TABS
100.0000 mg | ORAL_TABLET | Freq: Every day | ORAL | Status: DC
  Administered 2023-10-15 – 2023-10-17 (×3): 100 mg via ORAL
  Filled 2023-10-14 (×3): qty 1

## 2023-10-14 MED ORDER — STERILE WATER FOR INJECTION IJ SOLN
INTRAMUSCULAR | Status: AC
  Administered 2023-10-14: 10 mL
  Filled 2023-10-14: qty 10

## 2023-10-14 MED ORDER — LACTATED RINGERS IV SOLN: INTRAVENOUS | Status: DC

## 2023-10-14 MED ORDER — PANTOPRAZOLE SODIUM 40 MG IV SOLR
40.0000 mg | Freq: Two times a day (BID) | INTRAVENOUS | Status: DC
  Administered 2023-10-14 – 2023-10-15 (×3): 40 mg via INTRAVENOUS
  Filled 2023-10-14 (×3): qty 10

## 2023-10-14 MED ORDER — ATORVASTATIN CALCIUM 80 MG PO TABS
80.0000 mg | ORAL_TABLET | Freq: Every evening | ORAL | Status: DC
  Administered 2023-10-14 – 2023-10-16 (×3): 80 mg via ORAL
  Filled 2023-10-14 (×3): qty 1

## 2023-10-14 MED ORDER — PHENYLEPHRINE 80 MCG/ML (10ML) SYRINGE FOR IV PUSH (FOR BLOOD PRESSURE SUPPORT)
PREFILLED_SYRINGE | INTRAVENOUS | Status: DC | PRN
  Administered 2023-10-14 (×3): 160 ug via INTRAVENOUS

## 2023-10-14 MED ORDER — ONDANSETRON HCL 4 MG/2ML IJ SOLN
INTRAMUSCULAR | Status: DC | PRN
  Administered 2023-10-14: 4 mg via INTRAVENOUS

## 2023-10-14 MED ORDER — PROPOFOL 10 MG/ML IV BOLUS
INTRAVENOUS | Status: DC | PRN
  Administered 2023-10-14: 20 mg via INTRAVENOUS

## 2023-10-14 MED ORDER — PROPOFOL 500 MG/50ML IV EMUL
INTRAVENOUS | Status: DC | PRN
  Administered 2023-10-14: 150 ug/kg/min via INTRAVENOUS

## 2023-10-14 MED ORDER — ORAL CARE MOUTH RINSE: 15.0000 mL | OROMUCOSAL | Status: DC | PRN

## 2023-10-14 MED ORDER — SODIUM CHLORIDE 0.9 % IV SOLN: INTRAVENOUS | Status: DC

## 2023-10-14 MED ORDER — LIDOCAINE 2% (20 MG/ML) 5 ML SYRINGE
INTRAMUSCULAR | Status: DC | PRN
Start: 2023-10-14 — End: 2023-10-14
  Administered 2023-10-14: 50 mg via INTRAVENOUS

## 2023-10-14 MED ORDER — AMLODIPINE BESYLATE 10 MG PO TABS
10.0000 mg | ORAL_TABLET | Freq: Every day | ORAL | Status: DC
  Administered 2023-10-15 – 2023-10-17 (×3): 10 mg via ORAL
  Filled 2023-10-14 (×3): qty 1

## 2023-10-14 NOTE — Anesthesia Preprocedure Evaluation (Signed)
 Anesthesia Evaluation  Patient identified by MRN, date of birth, ID band Patient awake    Reviewed: Allergy & Precautions, NPO status , Patient's Chart, lab work & pertinent test results  Airway Mallampati: II  TM Distance: >3 FB Neck ROM: Full    Dental no notable dental hx.    Pulmonary shortness of breath, Patient abstained from smoking., former smoker   Pulmonary exam normal breath sounds clear to auscultation       Cardiovascular hypertension, Pt. on medications Normal cardiovascular exam Rhythm:Regular Rate:Normal     Neuro/Psych Seizures -,    Depression    CVA    GI/Hepatic ,GERD  ,,(+)     substance abuse  alcohol use  Endo/Other  diabetes    Renal/GU Renal InsufficiencyRenal disease     Musculoskeletal   Abdominal   Peds  Hematology  (+) Blood dyscrasia, anemia   Anesthesia Other Findings   Reproductive/Obstetrics                             Anesthesia Physical Anesthesia Plan  ASA: 3  Anesthesia Plan: MAC   Post-op Pain Management: Minimal or no pain anticipated   Induction: Intravenous  PONV Risk Score and Plan: 1 and Propofol  infusion and Treatment may vary due to age or medical condition  Airway Management Planned: Simple Face Mask and Nasal Cannula  Additional Equipment:   Intra-op Plan:   Post-operative Plan:   Informed Consent: I have reviewed the patients History and Physical, chart, labs and discussed the procedure including the risks, benefits and alternatives for the proposed anesthesia with the patient or authorized representative who has indicated his/her understanding and acceptance.     Dental advisory given  Plan Discussed with: CRNA and Anesthesiologist  Anesthesia Plan Comments:         Anesthesia Quick Evaluation

## 2023-10-14 NOTE — Progress Notes (Signed)
 Patient arrived to unit. Patient alert and oriented to self only.  VS obtained, MD notified of elevated BP and PO meds ordered but patient currently NPO, awaiting recommendations. No IV access at this time, IV consult ordered prior to arrival to unit. External urinary catheter placed on patient due to incontinence.Call bell within reach and bed alarm in place.

## 2023-10-14 NOTE — Transfer of Care (Signed)
 Immediate Anesthesia Transfer of Care Note  Patient: Gregory Nunez.  Procedure(s) Performed: ENTEROSCOPY EGD, WITH ARGON PLASMA COAGULATION  Patient Location: Endoscopy Unit  Anesthesia Type:MAC  Level of Consciousness: sedated and responds to stimulation  Airway & Oxygen  Therapy: Patient Spontanous Breathing and Patient connected to nasal cannula oxygen   Post-op Assessment: Report given to RN and Post -op Vital signs reviewed and stable  Post vital signs: Reviewed and stable  Last Vitals:  Vitals Value Taken Time  BP 106/60 10/14/23 14:52  Temp 36.2 C 10/14/23 14:50  Pulse 70 10/14/23 14:53  Resp 14 10/14/23 14:53  SpO2 100 % 10/14/23 14:53  Vitals shown include unfiled device data.  Last Pain:  Vitals:   10/14/23 1450  TempSrc: Temporal  PainSc: Asleep         Complications: No notable events documented.

## 2023-10-14 NOTE — Care Management Obs Status (Signed)
 MEDICARE OBSERVATION STATUS NOTIFICATION   Patient Details  Name: Gregory Nunez. MRN: [id number] Date of Birth: 1945/11/21   Medicare Observation Status Notification Given:  Yes    Corean JAYSON Canary, RN 10/14/2023, 8:42 AM

## 2023-10-14 NOTE — Anesthesia Postprocedure Evaluation (Signed)
 Anesthesia Post Note  Patient: Gregory Nunez.  Procedure(s) Performed: ENTEROSCOPY EGD, WITH ARGON PLASMA COAGULATION     Patient location during evaluation: PACU Anesthesia Type: MAC Level of consciousness: awake and alert Pain management: pain level controlled Vital Signs Assessment: post-procedure vital signs reviewed and stable Respiratory status: spontaneous breathing, nonlabored ventilation and respiratory function stable Cardiovascular status: blood pressure returned to baseline and stable Postop Assessment: no apparent nausea or vomiting Anesthetic complications: no   No notable events documented.  Last Vitals:  Vitals:   10/14/23 1520 10/14/23 1610  BP: 130/68 (!) 141/78  Pulse: 72 80  Resp: 12 16  Temp:  36.7 C  SpO2: 100% 100%    Last Pain:  Vitals:   10/14/23 1610  TempSrc: Oral  PainSc:                  Butler Levander Pinal

## 2023-10-14 NOTE — H&P (Addendum)
 Date: 10/14/2023               Patient Name:  Gregory Nunez. MRN: [id number]  DOB: 24-Mar-1946 Age / Sex: 78 y.o., male   PCP: Addie Perkins, DO         Medical Service: Internal Medicine Teaching Service         Attending Physician: Dr. Trudy, Mliss Dragon, MD      First Contact: Dr. Missy Sandhoff, MD    Second Contact: Dr. Hadassah Kristy Ahr, MD          After Hours (After 5p/  First Contact Pager: [phone]  weekends / holidays): Second Contact Pager: [phone]   SUBJECTIVE   Chief Complaint: Anemia from [facility]   History of Present Illness: Gregory Nunez. 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 Patient discharged 6/17 after 3-day admission for acute on chronic anemia 2/2 UGI bleed. 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 Hemoglobin day of discharge was 9.6.  Patient denies bleeding from stool or any other site.  Further denies falls, SOB, light-headedness, chest pain, palpitations, abdominal pain, dysuria, constipation, diarrhea.  Of note, patient with cognitive impairment so it is somewhat difficult to fully assess reliability of ROS though he appears to be at baseline.  Spoke with Joee, LPN, at [facility] who states that patient was off-centered from walking this morning prompting CBC which showed Hgb of 6.3 . They also noticed a large tarry stool in toilet.  Generally ambulates well with a rolling walker but seem to be struggling a little more today.  No witnessed falls.  At baseline he is alert and oriented to name and place but not year or situation.  He has been taking ASA and Plavix  for recent CVA with last dose of Plavix  being today as planned  during last admission.   Medications:  Current Outpatient Medications  Medication Instructions   amLODipine  (NORVASC ) 10 mg, Oral, Daily   aspirin  EC 81 mg, Oral, Daily, Swallow whole.   atorvastatin  (LIPITOR ) 80 mg, Oral, Daily   feeding supplement (ENSURE PLUS HIGH PROTEIN) LIQD 237 mLs, Oral, 3 times daily between meals   ferrous sulfate  325 mg, Oral, Daily with breakfast   hydrALAZINE  (APRESOLINE ) 25 mg, Oral, Every 8 hours   [Paused] losartan  (COZAAR ) 25 mg, Oral, Daily   multivitamin (PROSIGHT) TABS tablet 1 tablet, Oral, Daily   pantoprazole  (PROTONIX ) 40 MG tablet Take 40mg  (1 tablet) twice daily for 6 weeks. Then on 11/15/23, reduce dose to 40mg  (1 tablet) daily. Do this until instructed to stop.   thiamine  (VITAMIN B-1) 100 mg, Oral, Daily    Past Medical History Past Medical History:  Diagnosis Date   Acute metabolic encephalopathy 10/22/2020   Acute metabolic encephalopathy 10/22/2020   Alcohol use 04/23/2020   Alcohol withdrawal seizure with complication, with unspecified complication (HCC) 11/08/2018   Angiodysplasia of stomach and duodenum with bleeding    CAO (chronic airflow obstruction) (HCC)    Cataract    OD   Chronic kidney disease, stage 4 (severe) (HCC)    Cognitive communication deficit    CVA (cerebral vascular accident) (HCC)    Depression    Diabetes mellitus without complication (HCC)    Diabetic  retinopathy (HCC)    NPDR OU   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)    Epilepsy (HCC)    GERD (gastroesophageal reflux disease)    if drinks alcohol   GI bleed 10/05/2020   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   Patient is approximately 5-week status post bunion correction left foot   Hyperlipidemia    Hypertension    Hypertensive retinopathy    OU   Hypoglycemia 10/22/2020   Malignant neoplasm of prostate (HCC) 01/09/2014   Neuropathy    Pancreatitis    Pneumococcal vaccination administered at current visit  11/10/2020   Prostate cancer (HCC) 12/19/2013   Gleason 4+3=7, volume 31.31 cc   Rhabdomyolysis 04/12/2021   Sciatica    Shortness of breath dyspnea    with exertion    Viral hepatitis C     Past Surgical History Past Surgical History:  Procedure Laterality Date   BIOPSY  04/16/2018   Procedure: BIOPSY;  Surgeon: Kristie Lamprey, MD;  Location: [facility] ENDOSCOPY;  Service: Endoscopy;;   BIOPSY  05/23/2020   Procedure: BIOPSY;  Surgeon: Charlanne Groom, MD;  Location: [facility] ENDOSCOPY;  Service: Gastroenterology;;  EGD and COLON   BIOPSY  12/09/2020   Procedure: BIOPSY;  Surgeon: Eda Iha, MD;  Location: [facility] ENDOSCOPY;  Service: Gastroenterology;;   biopsy on throat     hx of    CATARACT EXTRACTION Bilateral    Dr. Medford Ferrier   COLONOSCOPY N/A 05/23/2020   Procedure: COLONOSCOPY;  Surgeon: Charlanne Groom, MD;  Location: [facility] ENDOSCOPY;  Service: Gastroenterology;  Laterality: N/A;   ENTEROSCOPY N/A 10/07/2020   Procedure: ENTEROSCOPY;  Surgeon: Avram Lupita BRAVO, MD;  Location: [facility] ENDOSCOPY;  Service: Endoscopy;  Laterality: N/A;   ENTEROSCOPY N/A 10/03/2023   Procedure: ENTEROSCOPY;  Surgeon: San Sandor GAILS, DO;  Location: [facility] ENDOSCOPY;  Service: Gastroenterology;  Laterality: N/A;   ESOPHAGOGASTRODUODENOSCOPY Left 04/16/2018   Procedure: ESOPHAGOGASTRODUODENOSCOPY (EGD);  Surgeon: Kristie Lamprey, MD;  Location: [facility];  Service: Endoscopy;  Laterality: Left;   ESOPHAGOGASTRODUODENOSCOPY (EGD) WITH PROPOFOL  N/A 05/23/2020   Procedure: ESOPHAGOGASTRODUODENOSCOPY (EGD) WITH PROPOFOL ;  Surgeon: Charlanne Groom, MD;  Location: [facility];  Service: Gastroenterology;  Laterality: N/A;   ESOPHAGOGASTRODUODENOSCOPY (EGD) WITH PROPOFOL  N/A 12/09/2020   Procedure: ESOPHAGOGASTRODUODENOSCOPY (EGD) WITH PROPOFOL ;  Surgeon: Eda Iha, MD;  Location: [facility] ENDOSCOPY;  Service: Gastroenterology;  Laterality: N/A;   EYE SURGERY     FOOT SURGERY     HOT HEMOSTASIS N/A 04/16/2018   Procedure: HOT  HEMOSTASIS (ARGON PLASMA COAGULATION/BICAP);  Surgeon: Kristie Lamprey, MD;  Location: [facility];  Service: Endoscopy;  Laterality: N/A;   HOT HEMOSTASIS N/A 05/23/2020   Procedure: HOT HEMOSTASIS (ARGON PLASMA COAGULATION/BICAP);  Surgeon: Charlanne Groom, MD;  Location: [facility];  Service: Gastroenterology;  Laterality: N/A;   HOT HEMOSTASIS N/A 12/09/2020   Procedure: HOT HEMOSTASIS (ARGON PLASMA COAGULATION/BICAP);  Surgeon: Eda Iha, MD;  Location: [facility] ENDOSCOPY;  Service: Gastroenterology;  Laterality: N/A;   HOT HEMOSTASIS N/A 10/03/2023   Procedure: EGD, WITH ARGON PLASMA COAGULATION;  Surgeon: San Sandor GAILS, DO;  Location: [facility] ENDOSCOPY;  Service: Gastroenterology;  Laterality: N/A;   LYMPHADENECTOMY Bilateral 02/27/2014   Procedure: BILATERAL LYMPHADENECTOMY;  Surgeon: Ricardo Likens, MD;  Location: [facility] ORS;  Service: Urology;  Laterality: Bilateral;   POLYPECTOMY  05/23/2020   Procedure: POLYPECTOMY;  Surgeon: Charlanne Groom, MD;  Location: [facility] ENDOSCOPY;  Service: Gastroenterology;;   PROSTATE BIOPSY  12/2013   Gleason 4+3=7, volume 31.31 cc   ROBOT ASSISTED  LAPAROSCOPIC RADICAL PROSTATECTOMY N/A 02/27/2014   Procedure: ROBOTIC ASSISTED LAPAROSCOPIC RADICAL PROSTATECTOMY WITH INDOCYANINE GREEN DYE;  Surgeon: Ricardo Likens, MD;  Location: [facility] ORS;  Service: Urology;  Laterality: N/A;    Social:  Lives: [employer] Support: Leonel, Bazil Dhanani Level of Function: rolling walker for ambulation PCP: Dr. Damien Hutchinson (formerly) Substances: prior history of tobacco and alcohol use, in remission. No recreational drus use   Family History:  Family History  Problem Relation Age of Onset   Heart disease Mother    Heart attack Father 17   Cancer Sister        breast   Colon cancer Neg Hx    Esophageal cancer Neg Hx    Rectal cancer Neg Hx    Stomach cancer Neg Hx     Allergies: Allergies as of 10/13/2023 - Reviewed 10/13/2023  Allergen Reaction Noted    Penicillins Other (See Comments) 05/16/2011    Review of Systems: A complete ROS was negative except as per HPI.   OBJECTIVE:   Physical Exam: Blood pressure (!) 143/71, pulse 100, temperature 98.4 F (36.9 C), temperature source Oral, resp. rate 14, SpO2 100%.   Constitutional: well-appearing, lying in bed, in no acute distress HENT: normocephalic atraumatic, mucous membranes moist Eyes: PERRLA, conjunctiva with pallor Cardiovascular: regular rate and rhythm, no m/r/g, extremities are warm Pulmonary/Chest: normal work of breathing on room air, lungs clear to auscultation bilaterally Abdominal: soft, non-tender, non-distended MSK: normal bulk and tone Neurological: alert & oriented to name (a little confused about place, but asked if it was [place]) CN II-VII intact, expressive aphasia, good strength throughout Skin: warm and dry, no bruising, abrasions, ulcers  Labs: CBC    Component Value Date/Time   WBC 5.5 10/13/2023 2255   RBC 1.89 (L) 10/13/2023 2255   HGB 5.8 (LL) 10/13/2023 2255   HGB 10.6 (L) 10/14/2022 1016   HCT 17.6 (L) 10/13/2023 2255   HCT 32.2 (L) 10/14/2022 1016   PLT 161 10/13/2023 2255   PLT 166 10/14/2022 1016   MCV 93.1 10/13/2023 2255   MCV 93 10/14/2022 1016   MCH 30.7 10/13/2023 2255   MCHC 33.0 10/13/2023 2255   RDW 15.3 10/13/2023 2255   RDW 11.9 10/14/2022 1016   LYMPHSABS 0.9 10/02/2023 0356   MONOABS 0.6 10/02/2023 0356   EOSABS 0.2 10/02/2023 0356   BASOSABS 0.0 10/02/2023 0356     CMP     Component Value Date/Time   NA 136 10/13/2023 2255   NA 137 10/13/2023 2255   NA 140 10/14/2022 1016   K 5.6 (H) 10/13/2023 2255   K 5.6 (H) 10/13/2023 2255   CL 107 10/13/2023 2255   CL 107 10/13/2023 2255   CO2 18 (L) 10/13/2023 2255   CO2 15 (L) 10/13/2023 2255   GLUCOSE 189 (H) 10/13/2023 2255   GLUCOSE 188 (H) 10/13/2023 2255   BUN 104 (H) 10/13/2023 2255   BUN 105 (H) 10/13/2023 2255   BUN 39 (H) 10/14/2022 1016   CREATININE 4.27 (H)  10/13/2023 2255   CREATININE 4.19 (H) 10/13/2023 2255   CREATININE 1.52 (H) 08/28/2020 1427   CALCIUM  8.2 (L) 10/13/2023 2255   CALCIUM  8.3 (L) 10/13/2023 2255   PROT 6.7 10/13/2023 2255   PROT 7.4 10/14/2022 1016   ALBUMIN 2.9 (L) 10/13/2023 2255   ALBUMIN 2.8 (L) 10/13/2023 2255   ALBUMIN 4.0 10/14/2022 1016   AST 50 (H) 10/13/2023 2255   ALT 37 10/13/2023 2255   ALKPHOS 52 10/13/2023  2255   BILITOT 0.8 10/13/2023 2255   BILITOT <0.2 10/14/2022 1016   GFRNONAA 14 (L) 10/13/2023 2255   GFRNONAA 14 (L) 10/13/2023 2255   GFRNONAA 45 (L) 08/28/2020 1427   GFRAA 52 (L) 08/28/2020 1427    Imaging:  EKG: personally reviewed my interpretation is NSR  ASSESSMENT & PLAN:   Assessment & Plan by Problem: Principal Problem:   Acute on chronic anemia Active Problems:   Hypertension    History of CVA (cerebrovascular accident)   Chronic kidney disease (CKD), stage IV (severe) (HCC)   Essential hypertension   Protein-calorie malnutrition (HCC)   Thiamine  deficiency   Debby Duos Saqib Cazarez. 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 Admitted for acute on chronic anemia 2/2 suspected GI bleed.  Acute on chronic anemia UGI Bleed Hx of Duodenal AVM's Anemia of Chronic Disease IDA Hgb 5.8 on admission. ED FOBT unsurprisingly positive.  Melanotic stool on ED rectal exam and this was also noted at [facility].  I suspect this is related to AVMs in the duodenum that were found during push enteroscopy last admission further c/b resumption of DAPT.  Plavix  end date was today.  I do not suspect brisk enough bleed that would necessitate angiography.  He is HDS and overall well-appearing. 6/15 iron  studies with ferritin of 40 which is below goal for CKD 4. Ganzoni equation for iron  deficit is of  limited utility given that this is not isolated IDA.  2 units PRBCs ordered in ED. GI has been consulted. - Follow-up GI recommendations - Trend post-transfusion CBC - IV Protonix  40 twice daily - Hold ASA - Orthostatics  CKD stage IV/V Elevated Creatinine Mild elevation in creatinine though interpretation limited given advanced CKD. Suspect elevation is 2/2 to above. GFR is at baseline. - Trend RFP - Losartan  has been held since last admission, continue holding - LR @ 50 cc/hr  - Avoid nephrotoxic medications - He needs outpatient Nephrology follow-up  Hypertension Mildly hypertensive.  Will hold home amlodipine  and hydralazine  given suspected GI bleed. Also holding Losartan  per above. - Consider resuming Amlodipine  and/or Hydralazine  once bleed is elucidated/contained and/or he becomes increasingly hypertensive  History of CVA (L infarct in the L thalamus and parietal white matter 09/2023) HLD Cognitive Impairment Cognitive impairment which has been attributed to chronic vascular disease further c/b recent stroke.  He appears at baseline. -Resume home Lipitor  80 mg  Protein calorie malnutrition Thiamine  deficiency -Resume home thiamine  -Nepro Shakes when not NPO  Diet: NPO VTE: SCDs IVF: LR @ 50 cc/hr Code: Full  Dispo: Admit patient to Observation with expected length of stay less than 2 midnights.  Signed: Marylu Gee, DO Internal Medicine Resident PGY-1 10/14/2023, 1:32 AM

## 2023-10-14 NOTE — Op Note (Signed)
 [place] [facility] Patient Name: Gregory Nunez Procedure Date : 10/14/2023 MRN: [id number] Attending MD: Elspeth SQUIBB. Leigh , MD, [id number] Date of Birth: 1946-01-29 CSN: [id number] Age: 78 Admit Type: Inpatient Procedure:                Small bowel enteroscopy Indications:              Melena - recurrent anemia on PLavix  for recent CVA,                            recently had AVMs of the small bowel treated < 2                            weeks ago - rule out post APC related ulcer bleed                            vs. additional AVMs Providers:                Elspeth P. Leigh, MD, Burnard Fire RN, RN, Felice Sar, Technician Referring MD:              Medicines:                Monitored Anesthesia Care Complications:            No immediate complications. Estimated blood loss:                            Minimal. Estimated Blood Loss:     Estimated blood loss was minimal. Procedure:                Pre-Anesthesia Assessment:                           - Prior to the procedure, a History and Physical                            was performed, and patient medications and                            allergies were reviewed. The patient's tolerance of                            previous anesthesia was also reviewed. The risks                            and benefits of the procedure and the sedation                            options and risks were discussed with the patient.                            All questions were answered, and informed consent  was obtained. Prior Anticoagulants: The patient has                            taken Plavix  (clopidogrel ), last dose was 1 day                            prior to procedure. ASA Grade Assessment: III - A                            patient with severe systemic disease. After                            reviewing the risks and benefits, the patient was                             deemed in satisfactory condition to undergo the                            procedure.                           After obtaining informed consent, the endoscope was                            passed under direct vision. Throughout the                            procedure, the patient's blood pressure, pulse, and                            oxygen  saturations were monitored continuously. The                            [id number]-[id number] ([id number]) Olympus colonoscope was                            introduced through the mouth, and advanced to the                            proximal jejunum. After obtaining informed consent,                            the endoscope was passed under direct vision.                            Throughout the procedure, the patient's blood                            pressure, pulse, and oxygen  saturations were                            monitored continuously.The small bowel enteroscopy  was accomplished without difficulty. The patient                            tolerated the procedure well. Scope In: Scope Out: Findings:      The examined esophagus was normal.      Patchy mildly erythematous mucosa was found in the gastric antrum.      The exam of the stomach was otherwise normal.      A single angiodysplastic lesion with no bleeding was found in the       duodenal bulb. Fulguration to ablate the lesion to prevent bleeding by       argon plasma was successful.      The exam of the duodenum was otherwise normal.      Five angiodysplastic lesions with no bleeding were found in the proximal       jejunum. Fulguration to ablate the lesion to prevent bleeding by argon       plasma was successful.      Exam of the jejunum was otherwise normal.      The distal most extent reached in the proximal jejunum was tattooed with       an injection of Spot (carbon black). Impression:               - Normal esophagus.                           - Erythematous  mucosa in the antrum.                           - Normal stomach otherwise.                           - A single non-bleeding angiodysplastic lesion in                            the duodenum. Treated with argon plasma coagulation                            (APC).                           - Five non-bleeding angiodysplastic lesions in the                            proximal duodenum. Treated with argon plasma                            coagulation (APC).                           - Distal most extent of proximal jejunum reached                            was tattooed.                           - Normal remainder of examined small bowel. Recommendation:           - Return patient  to hospital ward for ongoing care.                           - Clear liquid diet.                           - Continue present medications.                           - Hold Plavix  today                           - Trend Hgb                           - Given the patient needs to continue Plavix  moving                            forward, and recurrent anemia, suspected additional                            small bowel AVMs distally, would recommend                            octreotide depot injections as outpatient to reduce                            his bleeding risk                           - If further bleeding recommend capsule endoscopy                            to evaluate further distally, assess AVM burden and                            for a dominant lesion to cause bleeding Procedure Code(s):        --- Professional ---                           [phone], Small intestinal endoscopy, enteroscopy                            beyond second portion of duodenum, not including                            ileum; with control of bleeding (eg, injection,                            bipolar cautery, unipolar cautery, laser, heater                            probe, stapler, plasma coagulator)                            44799, Unlisted procedure, small intestine Diagnosis  Code(s):        --- Professional ---                           K31.89, Other diseases of stomach and duodenum                           K31.819, Angiodysplasia of stomach and duodenum                            without bleeding                           K92.1, Melena (includes Hematochezia) CPT copyright 2022 [employer]. All rights reserved. The codes documented in this report are preliminary and upon coder review may  be revised to meet current compliance requirements. Elspeth P. Leigh, MD 10/14/2023 3:08:11 PM This report has been signed electronically. Number of Addenda: 0

## 2023-10-14 NOTE — Consult Note (Signed)
 Consultation Note   Referring Provider:  Internal Medicine Teaching Service PCP: Addie Perkins, DO Primary Gastroenterologist:  Elspeth Naval, MD  Reason for Consultation: GI bleed DOA: 10/13/2023         Hospital Day: 2   ASSESSMENT    78 yo male with a history of iron  deficiency anemia, small bowel AVMs, colon polyps, CKD IV / V, DM, hypertension, alcohol abuse, thiamine  deficiency,  and CVA with residual dysarthria.    Recurrent acute on chronic anemia FOBT positive (reported dark tarry stool prior to admission however he takes oral iron  ). On plavix  and ASA for very recent CVA.  History of small bowel AVMs status post multiple endoscopies.  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 Presenting hemoglobin 5.8, down from 9 less than 2 weeks ago.  Patient is hemodynamically stable.  Hemoglobin improved post transfusion  Recent CVA ( early June) with residual dysarthria  On plavix  and asa  CKD IV/ V BUN  94, Creatinine 4.14 GFR 14  Protein calorie malnutrition Thiamine  deficiency  See PMH for additional history  Principal Problem:   Acute on chronic anemia Active Problems:   Hypertension associated with diabetes (HCC)   History of CVA (cerebrovascular accident)   Chronic kidney disease (CKD), stage IV (severe) (HCC)   Essential hypertension   AVM (arteriovenous malformation) of small bowel, acquired   Protein-calorie malnutrition (HCC)   Thiamine  deficiency     PLAN:   --Monitor H&H.  Hemoglobin improved to 7.8 post 2 units of blood --Twice daily IV PP --n.p.o. -- Patient will need EGD, probably today.  The risks and benefits of EGD with possible biopsies were discussed with the patient who agrees to proceed.  Patient has dysarthria but he did make it clear to me that he understood the procedure and had no questions  HPI   Brief history  Patient has previously undergone multiple  endoscopic procedures with small nonbleeding AVMs in the upper GI tract which were ablated .  He was hospitalized earlier this month for severe anemia.  He had had a recent CVA and was started on Plavix  .  His hemoglobin was 5 and there were reports of melenic stools . 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  He required 3 units of blood during that admission.  Hemoglobin 9.6 at time of discharge.   Interval history Patient brought to ED yesterday from [facility] health care for evaluation of low hemoglobin (hemoglobin was 6.3).  Apparently labs were checked because patient's gait was unusual for him. He reportedly had a large tarry stool at the facility but oral iron  is on home med list.  He has still been taking aspirin  and Plavix  for recent CVA.  Patient is not aware of any gastrointestinal bleeding.  No nausea or vomiting.  No abdominal pain.    Labs and Imaging:  Recent Labs    10/13/23 2255 10/14/23 0044 10/14/23 0652  PROT 6.7  --   --   ALBUMIN 2.8*  2.9* 2.7* 2.7*  AST 50*  --   --   ALT 37  --   --  ALKPHOS 52  --   --   BILITOT 0.8  --   --    Recent Labs    10/13/23 2255 10/14/23 0652  WBC 5.5 5.0  HGB 5.8* 7.8*  HCT 17.6* 23.4*  MCV 93.1 90.7  PLT 161 141*   Recent Labs    10/13/23 2255 10/14/23 0044 10/14/23 0652  NA 137  136 139 142  K 5.6*  5.6* 4.4 4.0  CL 107  107 111 109  CO2 15*  18* 19* 20*  GLUCOSE 188*  189* 153* 79  BUN 105*  104* 102* 94*  CREATININE 4.19*  4.27* 4.31* 4.14*  CALCIUM  8.3*  8.2* 8.4* 8.7*     VAS US  CAROTID Carotid Arterial Duplex Study  Patient Name:  Neri Samek.  Date of Exam:   09/23/2023 Medical Rec #: [id number]                Accession #:    [id number] Date of Birth: 06-24-1945                Patient Gender: M Patient Age:   78 years Exam Location:  [facility] Procedure:       VAS US  CAROTID Referring Phys: PRAMOD SETHI  --------------------------------------------------------------------------------   Indications:       CVA. Risk Factors:      Hypertension, hyperlipidemia, Diabetes, current smoker, prior                    CVA. Other Factors:     CKD,. Comparison Study:  No previous exams  Performing Technologist: Jody Hill RVT, RDMS    Examination Guidelines: A complete evaluation includes B-mode imaging, spectral Doppler, color Doppler, and power Doppler as needed of all accessible portions of each vessel. Bilateral testing is considered an integral part of a complete examination. Limited examinations for reoccurring indications may be performed as noted.    Right Carotid Findings: +----------+--------+--------+--------+------------------+------------------+           PSV cm/sEDV cm/sStenosisPlaque DescriptionComments           +----------+--------+--------+--------+------------------+------------------+ CCA Prox  108     10                                                   +----------+--------+--------+--------+------------------+------------------+ CCA Distal78      11                                intimal thickening +----------+--------+--------+--------+------------------+------------------+ ICA Prox  42      11              focal and calcific                   +----------+--------+--------+--------+------------------+------------------+ ICA Distal37      10                                                   +----------+--------+--------+--------+------------------+------------------+ ECA       80      0                                                    +----------+--------+--------+--------+------------------+------------------+  +----------+--------+-------+----------------+-------------------+  PSV cm/sEDV cmsDescribe        Arm Pressure  (mmHG) +----------+--------+-------+----------------+-------------------+ [id number]            Multiphasic, WNL                    +----------+--------+-------+----------------+-------------------+  +---------+--------+--+--------+-+---------+ VertebralPSV cm/s46EDV cm/s9Antegrade +---------+--------+--+--------+-+---------+     Left Carotid Findings: +----------+--------+--------+--------+---------------------+------------------+           PSV cm/sEDV cm/sStenosisPlaque Description   Comments           +----------+--------+--------+--------+---------------------+------------------+ CCA Prox  111     14                                                      +----------+--------+--------+--------+---------------------+------------------+ CCA Distal86      11                                   intimal thickening +----------+--------+--------+--------+---------------------+------------------+ ICA Prox  32      9               focal and                                                                 heterogenous                            +----------+--------+--------+--------+---------------------+------------------+ ICA Distal29      9                                                       +----------+--------+--------+--------+---------------------+------------------+ ECA       70      0                                                       +----------+--------+--------+--------+---------------------+------------------+  +----------+--------+--------+----------------+-------------------+           PSV cm/sEDV cm/sDescribe        Arm Pressure (mmHG) +----------+--------+--------+----------------+-------------------+ [id number]              Multiphasic, WNL                    +----------+--------+--------+----------------+-------------------+  +---------+--------+--+--------+-+---------+ VertebralPSV  cm/s28EDV cm/s6Antegrade +---------+--------+--+--------+-+---------+        Summary: Right Carotid: The extracranial vessels were near-normal with only minimal wall                thickening or plaque.  Left Carotid: The extracranial vessels were near-normal with only minimal wall               thickening or plaque.  Vertebrals:  Bilateral vertebral arteries demonstrate antegrade flow. Subclavians: Normal flow hemodynamics were seen  in bilateral subclavian              arteries.  *See table(s) above for measurements and observations.    Electronically signed by Eather Popp MD on 09/26/2023 at 8:00:57 AM.      Final      Pertinent GI Studies   Patient has had multiple endoscopic procedures.  Below is most recent   10/14/23 Small bowel endoscopy - Normal esophagus. - Moderate, patchy gastritis noted in the gastric fundus, gastric cardia, and proximal gastric body. This was biopsied. - Minimal, nonulcer gastritis noted in the remainder of the stomach. - Four non-bleeding angioectasias in the duodenum. Treated with argon plasma coagulation (APC). - The examined portion of the jejunum was normal. - Single small, shallow erosion in the duodenal bulb. There is no active bleeding or stigmata of recent bleeding.  Most recent colonoscopy was February 2022 Adequate prep - Three 4 to 6 mm polyps in the proximal ascending colon and in the mid ascending colon, removed with a cold snare. Resected and retrieved. - One 6 mm polyp in the proximal sigmoid colon, removed with a cold snare. Resected and retrieved. - One 4 mm polyp in the distal rectum, removed with a cold biopsy forceps. Resected and retrieved. - Small lipoma in the mid ascending colon. - Non-bleeding internal hemorrhoids. - The examined portion of the ileum was normal. - The examination was otherwise normal on direct and retroflexion views.  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)    Epilepsy (HCC)    GERD (gastroesophageal reflux disease)    if drinks alcohol   GI bleed 10/05/2020   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   Patient is approximately 5-week status post bunion correction left foot   Hyperlipidemia    Hypertension    Hypertensive retinopathy    OU   Hypoglycemia 10/22/2020   Malignant neoplasm of prostate (HCC) 01/09/2014   Neuropathy    Pancreatitis    Pneumococcal vaccination administered at current visit 11/10/2020   Prostate cancer (HCC) 12/19/2013   Gleason 4+3=7, volume 31.31 cc   Rhabdomyolysis 04/12/2021   Sciatica    Shortness of breath dyspnea    with exertion    Viral hepatitis C     Past Surgical History:  Procedure Laterality Date   BIOPSY  04/16/2018   Procedure: BIOPSY;  Surgeon: Kristie Lamprey, MD;  Location: [facility];  Service: Endoscopy;;   BIOPSY  05/23/2020   Procedure: BIOPSY;  Surgeon: Charlanne Groom, MD;  Location: [facility];  Service: Gastroenterology;;  EGD and COLON   BIOPSY  12/09/2020   Procedure: BIOPSY;  Surgeon: Eda Iha, MD;  Location[facility]COPY;  Service: Gastroenterology;;   biopsy on throat     hx of    CATARACT EXTRACTION Bilateral    Dr. Medford Ferrier   COLONOSCOPY N/A 05/23/2020  Procedure: COLONOSCOPY;  Surgeon: Charlanne Groom, MD;  Location: [facility];  Service: Gastroenterology;  Laterality: N/A;    ENTEROSCOPY N/A 10/07/2020   Procedure: ENTEROSCOPY;  Surgeon: Avram Lupita BRAVO, MD;  Location: [facility] ENDOSCOPY;  Service: Endoscopy;  Laterality: N/A;   ENTEROSCOPY N/A 10/03/2023   Procedure: ENTEROSCOPY;  Surgeon: San Sandor GAILS, DO;  Location: [facility] ENDOSCOPY;  Service: Gastroenterology;  Laterality: N/A;   ESOPHAGOGASTRODUODENOSCOPY Left 04/16/2018   Procedure: ESOPHAGOGASTRODUODENOSCOPY (EGD);  Surgeon: Kristie Lamprey, MD;  Location: [facility];  Service: Endoscopy;  Laterality: Left;   ESOPHAGOGASTRODUODENOSCOPY (EGD) WITH PROPOFOL  N/A 05/23/2020   Procedure: ESOPHAGOGASTRODUODENOSCOPY (EGD) WITH PROPOFOL ;  Surgeon: Charlanne Groom, MD;  Location: [facility];  Service: Gastroenterology;  Laterality: N/A;   ESOPHAGOGASTRODUODENOSCOPY (EGD) WITH PROPOFOL  N/A 12/09/2020   Procedure: ESOPHAGOGASTRODUODENOSCOPY (EGD) WITH PROPOFOL ;  Surgeon: Eda Iha, MD;  Location: [facility] ENDOSCOPY;  Service: Gastroenterology;  Laterality: N/A;   EYE SURGERY     FOOT SURGERY     HOT HEMOSTASIS N/A 04/16/2018   Procedure: HOT HEMOSTASIS (ARGON PLASMA COAGULATION/BICAP);  Surgeon: Kristie Lamprey, MD;  Location: [facility];  Service: Endoscopy;  Laterality: N/A;   HOT HEMOSTASIS N/A 05/23/2020   Procedure: HOT HEMOSTASIS (ARGON PLASMA COAGULATION/BICAP);  Surgeon: Charlanne Groom, MD;  Location: [facility];  Service: Gastroenterology;  Laterality: N/A;   HOT HEMOSTASIS N/A 12/09/2020   Procedure: HOT HEMOSTASIS (ARGON PLASMA COAGULATION/BICAP);  Surgeon: Eda Iha, MD;  Location: [facility] ENDOSCOPY;  Service: Gastroenterology;  Laterality: N/A;   HOT HEMOSTASIS N/A 10/03/2023   Procedure: EGD, WITH ARGON PLASMA COAGULATION;  Surgeon: San Sandor GAILS, DO;  Location: [facility] ENDOSCOPY;  Service: Gastroenterology;  Laterality: N/A;   LYMPHADENECTOMY Bilateral 02/27/2014   Procedure: BILATERAL LYMPHADENECTOMY;  Surgeon: Ricardo Likens, MD;  Location: [facility];  Service: Urology;  Laterality: Bilateral;   POLYPECTOMY   05/23/2020   Procedure: POLYPECTOMY;  Surgeon: Charlanne Groom, MD;  Location: [facility];  Service: Gastroenterology;;   PROSTATE BIOPSY  12/2013   Gleason 4+3=7, volume 31.31 cc   ROBOT ASSISTED LAPAROSCOPIC RADICAL PROSTATECTOMY N/A 02/27/2014   Procedure: ROBOTIC ASSISTED LAPAROSCOPIC RADICAL PROSTATECTOMY WITH INDOCYANINE GREEN DYE;  Surgeon: Ricardo Likens, MD;  Location: [facility];  Service: Urology;  Laterality: N/A;    Family History  Problem Relation Age of Onset   Heart disease Mother    Heart attack Father 69   Cancer Sister        breast   Colon cancer Neg Hx    Esophageal cancer Neg Hx    Rectal cancer Neg Hx    Stomach cancer Neg Hx     Prior to Admission medications   Medication Sig Start Date End Date Taking? Authorizing Provider  amLODipine  (NORVASC ) 10 MG tablet Take 1 tablet (10 mg total) by mouth daily. 06/20/23  Yes Jadine Toribio SQUIBB, MD  aspirin  EC 81 MG tablet Take 1 tablet (81 mg total) by mouth daily. Swallow whole. 09/25/23  Yes Tawkaliyar, Roya, DO  atorvastatin  (LIPITOR ) 80 MG tablet Take 1 tablet (80 mg total) by mouth daily. Patient taking differently: Take 80 mg by mouth every evening. 09/25/23  Yes Tawkaliyar, Roya, DO  clopidogrel  (PLAVIX ) 75 MG tablet Take 75 mg by mouth every evening.   Yes [provider]  ferrous sulfate  325 (65 FE) MG tablet Take 1 tablet (325 mg total) by mouth daily with breakfast. 06/21/23  Yes Jadine Toribio SQUIBB, MD  hydrALAZINE  (APRESOLINE ) 25 MG tablet Take 1 tablet (25 mg total) by mouth every 8 (eight) hours. 10/14/22  Yes Lou Claretta HERO, MD  multivitamin (PROSIGHT) TABS tablet Take 1 tablet by mouth daily. 09/25/23  Yes Tawkaliyar, Roya, DO  pantoprazole  (PROTONIX ) 40 MG tablet Take 40mg  (1 tablet) twice daily for 6 weeks. Then on 11/15/23, reduce dose to 40mg  (1 tablet) daily. Do this until instructed to stop. 10/04/23  Yes Juberg, Christopher, DO  COMBIVENT RESPIMAT 20-100 MCG/ACT AERS respimat Inhale 1 puff into the lungs  every 6 (six) hours as needed for shortness of breath. 06/12/18 08/19/19  [provider]  mometasone -formoterol  (DULERA) 100-5 MCG/ACT AERO Inhale 2 puffs into the lungs daily. Patient not taking: Reported on 08/19/2019 04/17/18 08/19/19  Rosario Eland I, MD  sucralfate  (CARAFATE ) 1 g tablet Take 1 tablet (1 g total) by mouth 4 (four) times daily. Patient not taking: Reported on 08/19/2019 04/17/18 08/19/19  Rosario Eland I, MD  tiotropium (SPIRIVA  HANDIHALER) 18 MCG inhalation capsule Place 1 capsule (18 mcg total) into inhaler and inhale daily. Patient not taking: Reported on 08/19/2019 04/17/18 08/19/19  Rosario Eland I, MD  traZODone  (DESYREL ) 50 MG tablet Take 50 mg by mouth at bedtime as needed for sleep. Patient not taking: Reported on 10/06/2020  10/08/20  [provider]    Current Facility-Administered Medications  Medication Dose Route Frequency Provider Last Rate Last Admin   amLODipine  (NORVASC ) tablet 10 mg  10 mg Oral Daily Juberg, Christopher, DO       atorvastatin  (LIPITOR ) tablet 80 mg  80 mg Oral QPM Marylu Gee, DO       pantoprazole  (PROTONIX ) injection 40 mg  40 mg Intravenous Q12H Marylu Gee, DO       thiamine  (VITAMIN B1) tablet 100 mg  100 mg Oral Daily Marylu Gee, DO       Current Outpatient Medications  Medication Sig Dispense Refill   amLODipine  (NORVASC ) 10 MG tablet Take 1 tablet (10 mg total) by mouth daily.     aspirin  EC 81 MG tablet Take 1 tablet (81 mg total) by mouth daily. Swallow whole.     atorvastatin  (LIPITOR ) 80 MG tablet Take 1 tablet (80 mg total) by mouth daily. (Patient taking differently: Take 80 mg by mouth every evening.)     clopidogrel  (PLAVIX ) 75 MG tablet Take 75 mg by mouth every evening.     ferrous sulfate  325 (65 FE) MG tablet Take 1 tablet (325 mg total) by mouth daily with breakfast.     hydrALAZINE  (APRESOLINE ) 25 MG tablet Take 1 tablet (25 mg total) by mouth every 8 (eight) hours. 270 tablet 3   multivitamin  (PROSIGHT) TABS tablet Take 1 tablet by mouth daily.     pantoprazole  (PROTONIX ) 40 MG tablet Take 40mg  (1 tablet) twice daily for 6 weeks. Then on 11/15/23, reduce dose to 40mg  (1 tablet) daily. Do this until instructed to stop.      Allergies as of 10/13/2023 - Reviewed 10/13/2023  Allergen Reaction Noted   Penicillins Other (See Comments) 05/16/2011    Social History   Socioeconomic History   Marital status: Married    Spouse name: Not on file   Number of children: Not on file   Years of education: Not on file   Highest education level: Not on file  Occupational History   Not on file  Tobacco Use   Smoking status: Former    Current packs/day: 0.50    Types: Cigarettes   Smokeless tobacco: Never   Tobacco comments:    1 ppd Wants Patches .  Stopped 1.5 weeks  ago   Vaping Use   Vaping status: Never Used  Substance and Sexual Activity   Alcohol use: Not Currently    Alcohol/week: 1.0 standard drink of alcohol    Types: 1 Cans of beer per week   Drug use: Not Currently    Types: Marijuana, Cocaine, Heroin    Comment: past hx approx 30 years ago    Sexual activity: Not on file  Other Topics Concern   Not on file  Social History Narrative   Not on file   Social Drivers of Health   Financial Resource Strain: Low Risk  (09/21/2023)   Overall Financial Resource Strain (CARDIA)    Difficulty of Paying Living Expenses: Not hard at all  Food Insecurity: No Food Insecurity (10/02/2023)   Hunger Vital Sign    Worried About Running Out of Food in the Last Year: Never true    Ran Out of Food in the Last Year: Never true  Transportation Needs: No Transportation Needs (10/02/2023)   PRAPARE - Administrator, Civil Service (Medical): No    Lack of Transportation (Non-Medical): No  Physical Activity: Insufficiently Active (09/21/2023)   Exercise Vital Sign    Days of Exercise per Week: 2 days    Minutes of Exercise per Session: 30 min  Stress: No Stress Concern Present  (09/21/2023)   [employer] of Occupational Health - Occupational Stress Questionnaire    Feeling of Stress : Not at all  Social Connections: Socially Isolated (09/21/2023)   Social Connection and Isolation Panel    Frequency of Communication with Friends and Family: Three times a week    Frequency of Social Gatherings with Friends and Family: Once a week    Attends Religious Services: Never    Database administrator or Organizations: No    Attends Banker Meetings: Never    Marital Status: Divorced  Catering manager Violence: Not At Risk (10/02/2023)   Humiliation, Afraid, Rape, and Kick questionnaire    Fear of Current or Ex-Partner: No    Emotionally Abused: No    Physically Abused: No    Sexually Abused: No     Code Status   Code Status: Full Code  Review of Systems: Due to some cognitive impairment and dysarthria it is somewhat difficult to assess reliability of ROS  Physical Exam: Vital signs in last 24 hours: Temp:  [98.1 F (36.7 C)-98.4 F (36.9 C)] 98.1 F (36.7 C) (06/27 0803) Pulse Rate:  [82-103] 86 (06/27 0815) Resp:  [11-22] 11 (06/27 0815) BP: (134-175)/(66-95) 136/66 (06/27 0815) SpO2:  [100 %] 100 % (06/27 0815)    General:  Pleasant thin male in NAD Psych:  Cooperative. Normal mood and affect Eyes: Pupils equal Ears:  Normal auditory acuity Nose: No deformity, discharge or lesions Neck:  Supple, no masses felt Lungs:  Clear to auscultation.  Heart:  Regular rate,  Abdomen:  Soft, nondistended, nontender, active bowel sounds, no masses felt Rectal :  Deferred Msk: Symmetrical without gross deformities.  Neurologic:  Alert, oriented, grossly normal neurologically Extremities : No edema Skin:  Intact without significant lesions.    Intake/Output from previous day: 06/26 0701 - 06/27 0700 In: 932.1 [I.V.:646.1; Blood:286] Out: 250 [Urine:250] Intake/Output this shift:  No intake/output data recorded.   Vina Dasen, NP-C    10/14/2023, 9:06 AM

## 2023-10-14 NOTE — Progress Notes (Addendum)
 HD#0 SUBJECTIVE:  Patient Summary: Gregory Nunez. 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 He is admitted for an upper GI bleed.  Was recently discharged 6/17 after 3-day admission for acute on chronic anemia 2/2 UGI bleed. 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   Overnight Events and Interim History: Admission overnight. Denies complaints or concerns this morning. He maintains that he is not seeing melena but report from facility is otherwise. He denies abdominal pain, N/V/D, chills, dizziness or any other specific complaint. He reports he is hungry and thirsty.  OBJECTIVE:  Vital Signs: Vitals:   10/14/23 1500 10/14/23 1510 10/14/23 1520 10/14/23 1610  BP: 104/60 118/69 130/68 (!) 141/78  Pulse: 70 72 72 80  Resp: 14 13 12 16   Temp:    98 F (36.7 C)  TempSrc:    Oral  SpO2: 100% 100% 100% 100%  Weight:      Height:       Supplemental O2: Room Air SpO2: 100 %  Filed Weights   10/14/23 0946 10/14/23 1216  Weight: 66.3 kg 66.3 kg     Intake/Output Summary (Last 24 hours) at 10/14/2023 1844 Last data filed at 10/14/2023 1440 Gross per 24 hour  Intake 1082.1 ml  Output 250 ml  Net 832.1 ml   Net IO Since Admission: 832.1 mL [10/14/23 1844]  Physical Exam: Physical Exam Constitutional:      General: He is not in acute distress.    Appearance: He is not ill-appearing.   Cardiovascular:     Rate and Rhythm: Normal rate and regular rhythm.     Pulses: Normal pulses.  Pulmonary:     Effort: Pulmonary effort is normal.     Breath sounds: Normal breath sounds. No wheezing or rales.  Abdominal:     General: Abdomen is flat.     Palpations: Abdomen is soft.     Tenderness: There is no  abdominal tenderness. There is no guarding.     Comments: Normal bowel sounds   Skin:    General: Skin is warm.     Coloration: Skin is pale.     Comments: Pallor of palms, nail beds   Neurological:     Mental Status: He is alert. Mental status is at baseline.     Comments: Dysarthria. Orientation waxes and wanes. Answers questions appropriately.  Psychiatric:        Mood and Affect: Mood normal.        Behavior: Behavior normal.    Patient Lines/Drains/Airways Status     Active Line/Drains/Airways     Name Placement date Placement time Site Days   Peripheral IV 10/14/23 22 G 1 Anterior;Right Forearm 10/14/23  1012  Forearm  less than 1   External Urinary Catheter 10/14/23  0955  --  less than 1            ASSESSMENT/PLAN:  Assessment: Principal Problem:   Acute on chronic blood loss anemia Active Problems:   AVM (arteriovenous malformation) of small bowel, acquired   Upper GI bleeding   Recent cerebrovascular accident (CVA)   Hypertension associated with diabetes (HCC)   Chronic kidney disease (CKD), stage IV (severe) (HCC)   Protein-calorie malnutrition (HCC)   Antiplatelet or antithrombotic long-term use  Gregory Nunez. 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 He is admitted for an upper GI bleed.  Plan: Acute on chronic anemia 2/2 UGI Bleed Hx of Duodenal AVM's Hx IDA/Anemia of Chronic Disease GI following, thank you. Plan for EGD today. Hgb 5.8 on admission now above 7 s/p 2u PRBCs. He has AVMs in the duodenum identified at admission earlier this month. Remains hemodynamically stable.  - Recs per GI - IV Protonix  40 twice daily - Hold ASA - Orthostatics  CKD stage IV/V Elevated Creatinine Mild elevation in creatinine though interpretation limited given  advanced CKD. Suspect elevation is 2/2 to above. GFR is at baseline. - Trend RFP - he received fluid resuscitation at admission - Losartan  has been held since last admission, continue holding that and other nephrotoxic medications  Hypertension Mildly hypertensive.  Will resume oral amlodipine  and hydralazine  after his procedure  History of CVA (L infarct in the L thalamus and parietal white matter 09/2023) HLD Cognitive Impairment Cognitive impairment which has been attributed to chronic vascular disease further c/b recent stroke.  He appears at baseline. -Resume home Lipitor  80 mg  Protein calorie malnutrition Thiamine  deficiency -Resume home thiamine  -Nepro Shakes when not NPO  Best Practice: Diet: NPO IVF: None VTE: SCDs Start: 10/14/23 0028 Code: Full Therapy Recs: Pending. Anticipate return to SNF. DISPO: Anticipated discharge TBD pending control of GI bleed.  Signature: Lonni Africa, D.O.  Internal Medicine Resident, PGY-1 [facility]  Pager: # [phone]. 6:44 PM, 10/14/2023

## 2023-10-14 NOTE — ED Notes (Signed)
 This RN observed patient trying to get out of bed, patient stated he needed to urinate. Patient had already urinated on self. Gown and linens changed, patient put in clean brief, soiled clothing placed in belongings bag. Patient states no other needs at this time.

## 2023-10-14 NOTE — ED Notes (Signed)
 Admitting MD at bedside.

## 2023-10-15 DIAGNOSIS — K219 Gastro-esophageal reflux disease without esophagitis: Secondary | ICD-10-CM | POA: Diagnosis present

## 2023-10-15 DIAGNOSIS — E785 Hyperlipidemia, unspecified: Secondary | ICD-10-CM | POA: Diagnosis present

## 2023-10-15 DIAGNOSIS — F0150 Vascular dementia without behavioral disturbance: Secondary | ICD-10-CM | POA: Diagnosis present

## 2023-10-15 DIAGNOSIS — Z7902 Long term (current) use of antithrombotics/antiplatelets: Secondary | ICD-10-CM | POA: Diagnosis not present

## 2023-10-15 DIAGNOSIS — Z8546 Personal history of malignant neoplasm of prostate: Secondary | ICD-10-CM | POA: Diagnosis not present

## 2023-10-15 DIAGNOSIS — E440 Moderate protein-calorie malnutrition: Secondary | ICD-10-CM | POA: Diagnosis present

## 2023-10-15 DIAGNOSIS — I69322 Dysarthria following cerebral infarction: Secondary | ICD-10-CM | POA: Diagnosis not present

## 2023-10-15 DIAGNOSIS — E1159 Type 2 diabetes mellitus with other circulatory complications: Secondary | ICD-10-CM | POA: Diagnosis present

## 2023-10-15 DIAGNOSIS — E113293 Type 2 diabetes mellitus with mild nonproliferative diabetic retinopathy without macular edema, bilateral: Secondary | ICD-10-CM | POA: Diagnosis present

## 2023-10-15 DIAGNOSIS — Z789 Other specified health status: Secondary | ICD-10-CM | POA: Diagnosis present

## 2023-10-15 DIAGNOSIS — I639 Cerebral infarction, unspecified: Secondary | ICD-10-CM | POA: Diagnosis not present

## 2023-10-15 DIAGNOSIS — D62 Acute posthemorrhagic anemia: Secondary | ICD-10-CM | POA: Diagnosis present

## 2023-10-15 DIAGNOSIS — K31811 Angiodysplasia of stomach and duodenum with bleeding: Secondary | ICD-10-CM | POA: Diagnosis present

## 2023-10-15 DIAGNOSIS — N185 Chronic kidney disease, stage 5: Secondary | ICD-10-CM | POA: Diagnosis present

## 2023-10-15 DIAGNOSIS — D500 Iron deficiency anemia secondary to blood loss (chronic): Secondary | ICD-10-CM | POA: Diagnosis present

## 2023-10-15 DIAGNOSIS — Z8249 Family history of ischemic heart disease and other diseases of the circulatory system: Secondary | ICD-10-CM | POA: Diagnosis not present

## 2023-10-15 DIAGNOSIS — K5521 Angiodysplasia of colon with hemorrhage: Secondary | ICD-10-CM | POA: Diagnosis present

## 2023-10-15 DIAGNOSIS — I152 Hypertension secondary to endocrine disorders: Secondary | ICD-10-CM | POA: Diagnosis present

## 2023-10-15 DIAGNOSIS — I129 Hypertensive chronic kidney disease with stage 1 through stage 4 chronic kidney disease, or unspecified chronic kidney disease: Secondary | ICD-10-CM | POA: Diagnosis not present

## 2023-10-15 DIAGNOSIS — Z7951 Long term (current) use of inhaled steroids: Secondary | ICD-10-CM | POA: Diagnosis not present

## 2023-10-15 DIAGNOSIS — K31819 Angiodysplasia of stomach and duodenum without bleeding: Secondary | ICD-10-CM | POA: Diagnosis not present

## 2023-10-15 DIAGNOSIS — Z8673 Personal history of transient ischemic attack (TIA), and cerebral infarction without residual deficits: Secondary | ICD-10-CM

## 2023-10-15 DIAGNOSIS — I120 Hypertensive chronic kidney disease with stage 5 chronic kidney disease or end stage renal disease: Secondary | ICD-10-CM | POA: Diagnosis present

## 2023-10-15 DIAGNOSIS — K5520 Angiodysplasia of colon without hemorrhage: Secondary | ICD-10-CM | POA: Diagnosis not present

## 2023-10-15 DIAGNOSIS — D649 Anemia, unspecified: Secondary | ICD-10-CM | POA: Diagnosis not present

## 2023-10-15 DIAGNOSIS — D631 Anemia in chronic kidney disease: Secondary | ICD-10-CM | POA: Diagnosis present

## 2023-10-15 DIAGNOSIS — Z87891 Personal history of nicotine dependence: Secondary | ICD-10-CM | POA: Diagnosis not present

## 2023-10-15 DIAGNOSIS — Z681 Body mass index (BMI) 19 or less, adult: Secondary | ICD-10-CM | POA: Diagnosis not present

## 2023-10-15 DIAGNOSIS — K922 Gastrointestinal hemorrhage, unspecified: Secondary | ICD-10-CM | POA: Diagnosis not present

## 2023-10-15 DIAGNOSIS — Z7982 Long term (current) use of aspirin: Secondary | ICD-10-CM | POA: Diagnosis not present

## 2023-10-15 DIAGNOSIS — N184 Chronic kidney disease, stage 4 (severe): Secondary | ICD-10-CM | POA: Diagnosis not present

## 2023-10-15 DIAGNOSIS — E1122 Type 2 diabetes mellitus with diabetic chronic kidney disease: Secondary | ICD-10-CM | POA: Diagnosis present

## 2023-10-15 DIAGNOSIS — D6489 Other specified anemias: Secondary | ICD-10-CM | POA: Diagnosis not present

## 2023-10-15 DIAGNOSIS — G40909 Epilepsy, unspecified, not intractable, without status epilepticus: Secondary | ICD-10-CM | POA: Diagnosis present

## 2023-10-15 DIAGNOSIS — E519 Thiamine deficiency, unspecified: Secondary | ICD-10-CM | POA: Diagnosis present

## 2023-10-15 LAB — RENAL FUNCTION PANEL
Albumin: 2.7 g/dL — ABNORMAL LOW (ref 3.5–5.0)
Anion gap: 8 (ref 5–15)
BUN: 82 mg/dL — ABNORMAL HIGH (ref 8–23)
CO2: 23 mmol/L (ref 22–32)
Calcium: 8.7 mg/dL — ABNORMAL LOW (ref 8.9–10.3)
Chloride: 112 mmol/L — ABNORMAL HIGH (ref 98–111)
Creatinine, Ser: 4.02 mg/dL — ABNORMAL HIGH (ref 0.61–1.24)
GFR, Estimated: 15 mL/min — ABNORMAL LOW (ref >60)
Glucose, Bld: 92 mg/dL (ref 70–99)
Phosphorus: 5 mg/dL — ABNORMAL HIGH (ref 2.5–4.6)
Potassium: 4.2 mmol/L (ref 3.5–5.1)
Sodium: 143 mmol/L (ref 135–145)

## 2023-10-15 LAB — BPAM RBC
Blood Product Expiration Date: 202507242359
Blood Product Expiration Date: 202507242359
ISSUE DATE / TIME: 202506270006
ISSUE DATE / TIME: 202506270218
Unit Type and Rh: 5100
Unit Type and Rh: 5100

## 2023-10-15 LAB — TYPE AND SCREEN
ABO/RH(D): O POS
Unit division: 0
Unit division: 0

## 2023-10-15 LAB — CBC
HCT: 23.3 % — ABNORMAL LOW (ref 39.0–52.0)
Hemoglobin: 7.9 g/dL — ABNORMAL LOW (ref 13.0–17.0)
MCH: 30.4 pg (ref 26.0–34.0)
MCHC: 33.9 g/dL (ref 30.0–36.0)
MCV: 89.6 fL (ref 80.0–100.0)
Platelets: 149 10*3/uL — ABNORMAL LOW (ref 150–400)
RBC: 2.6 MIL/uL — ABNORMAL LOW (ref 4.22–5.81)
RDW: 15.5 % (ref 11.5–15.5)
WBC: 6.2 10*3/uL (ref 4.0–10.5)
nRBC: 0 % (ref 0.0–0.2)

## 2023-10-15 MED ORDER — PANTOPRAZOLE SODIUM 40 MG PO TBEC
40.0000 mg | DELAYED_RELEASE_TABLET | Freq: Two times a day (BID) | ORAL | Status: DC
  Administered 2023-10-15 – 2023-10-17 (×3): 40 mg via ORAL
  Filled 2023-10-15 (×4): qty 1

## 2023-10-15 NOTE — Progress Notes (Signed)
 Progress Note   Subjective  Stable post enteroscopy yesterday. Denies complaints, wants to eat.   Objective   Vital signs in last 24 hours: Temp:  [97.2 F (36.2 C)-98.9 F (37.2 C)] 98.9 F (37.2 C) (06/28 0808) Pulse Rate:  [70-92] 86 (06/28 0808) Resp:  [12-23] 16 (06/28 0808) BP: (104-158)/(60-84) 154/81 (06/28 0808) SpO2:  [100 %] 100 % (06/28 0808) Weight:  [66.3 kg] 66.3 kg (06/27 1216) Last BM Date :  (PTA) General:    AA male in NAD Neurologic:  Alert and oriented,  grossly normal neurologically. Psych:  Cooperative. Normal mood and affect.  Intake/Output from previous day: 06/27 0701 - 06/28 0700 In: 870 [P.O.:720; I.V.:150] Out: 1125 [Urine:1125] Intake/Output this shift: Total I/O In: 120 [P.O.:120] Out: 400 [Urine:400]  Lab Results: Recent Labs    10/13/23 2255 10/14/23 0652 10/15/23 0516  WBC 5.5 5.0 6.2  HGB 5.8* 7.8* 7.9*  HCT 17.6* 23.4* 23.3*  PLT 161 141* 149*   BMET Recent Labs    10/14/23 0044 10/14/23 0652 10/15/23 0516  NA 139 142 143  K 4.4 4.0 4.2  CL 111 109 112*  CO2 19* 20* 23  GLUCOSE 153* 79 92  BUN 102* 94* 82*  CREATININE 4.31* 4.14* 4.02*  CALCIUM  8.4* 8.7* 8.7*   LFT Recent Labs    10/13/23 2255 10/14/23 0044 10/15/23 0516  PROT 6.7  --   --   ALBUMIN 2.8*  2.9*   < > 2.7*  AST 50*  --   --   ALT 37  --   --   ALKPHOS 52  --   --   BILITOT 0.8  --   --    < > = values in this interval not displayed.   PT/INR No results for input(s): LABPROT, INR in the last 72 hours.  Studies/Results: No results found.     Assessment / Plan:    78 y/o male here with the following:  Acute on chronic anemia - secondary to small bowel AVMs Recent CVA - on Plavix  and aspirin   Enteroscopy yesterday, multiple small bowel AVMs ablated. He has now had several AVMs treated in the past 2 weeks on 2 procedures. No evidence of post APC related ulcer bleeding.  Hemoglobin is stable post procedure.  I think he can  resume his previous diet and eat today.  I think he can resume Plavix  as well and monitor his hemoglobin.  If no further bleeding today and stable, then perhaps he can go home tomorrow.  If he has worsening anemia or recurrent bleeding with resumption of Plavix , then he will need a capsule endoscopy to assess burden of additional AVMs further down in his bowel.  He is at risk for AVM related bleeding in the setting of CKD.  Of note, I think this patient would benefit from octreotide depot therapy once monthly as outpatient to prevent recurrent AVM bleeding, if he is a candidate for that.  This can be difficult to get approved by insurance and would need assistance from pharmacy for approval.  PLAN: - resume prior diet - resume Plavix  - monitor Hgb and for rebleeding - if further bleeding, will need capsule endoscopy - recommend octreotide depot as outpatient if no contraindications and covered by insurance - will need close monitoring of Hgb upon discharge. Await course today, consider discharge in the next 1-2 days - we will reassess him tomorrow, call with questions in the interim  Marcey Naval, MD  [facility]

## 2023-10-15 NOTE — Plan of Care (Signed)

## 2023-10-15 NOTE — Progress Notes (Signed)
 HD#0 SUBJECTIVE:  Patient Summary: Gregory Nunez. 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 He is admitted for an upper GI bleed.   Was recently discharged 6/17 after 3-day admission for acute on chronic anemia 2/2 UGI bleed. 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  Overnight Events and Interim History: Since yesterday, enteroscopy with nonbleeding AVMs ablated. The patient feels well this morning. Is hungry and wants to eat. Has had no BMs, BPR, no abdominal pain, N/V. No acute complaints.  Significant Hospital Events:  6/27 Enteroscopy with 6 nonbleeding AVMs in small bowel treated APC  OBJECTIVE:  Vital Signs: Vitals:   10/14/23 1610 10/14/23 2007 10/15/23 0357 10/15/23 0808  BP: (!) 141/78 (!) 143/76 (!) 154/83 (!) 154/81  Pulse: 80 80 92 86  Resp: 16 16 16 16   Temp: 98 F (36.7 C) 98.7 F (37.1 C) 98.4 F (36.9 C) 98.9 F (37.2 C)  TempSrc: Oral   Oral  SpO2: 100% 100% 100% 100%  Weight:      Height:       Supplemental O2: Room Air SpO2: 100 %  Filed Weights   10/14/23 0946 10/14/23 1216  Weight: 66.3 kg 66.3 kg     Intake/Output Summary (Last 24 hours) at 10/15/2023 1100 Last data filed at 10/15/2023 1016 Gross per 24 hour  Intake 990 ml  Output 1525 ml  Net -535 ml   Net IO Since Admission: 147.1 mL [10/15/23 1100]  Physical Exam: Physical Exam Constitutional:      General: He is not in acute distress.    Appearance: He is not ill-appearing.  Cardiovascular:     Rate and Rhythm: Normal rate and regular rhythm.     Pulses: Normal pulses.  Pulmonary:     Effort: Pulmonary effort is normal.     Breath sounds: Normal breath sounds. No wheezing or rales.  Abdominal:      General: Abdomen is flat.     Palpations: Abdomen is soft.     Tenderness: There is no abdominal tenderness. There is no guarding.     Comments: Normal bowel sounds  Skin:    General: Skin is warm.  Neurological:     Mental Status: He is alert. Mental status is at baseline.     Comments: Dysarthria. Orientation waxes and wanes. Answers questions appropriately.  Psychiatric:        Mood and Affect: Mood normal.        Behavior: Behavior normal.   Patient Lines/Drains/Airways Status     Active Line/Drains/Airways     Name Placement date Placement time Site Days   Peripheral IV 10/14/23 22 G 1 Anterior;Right Forearm 10/14/23  1012  Forearm  1   External Urinary Catheter 10/14/23  0955  --  1            ASSESSMENT/PLAN:  Assessment: Principal Problem:   Acute on chronic blood loss anemia Active Problems:   Hypertension associated with diabetes (HCC)   Chronic kidney disease (CKD), stage IV (severe) (HCC)   AVM (arteriovenous malformation) of small bowel, acquired   Protein-calorie malnutrition (HCC)   Antiplatelet or antithrombotic long-term use   Upper GI bleeding   Recent cerebrovascular accident (CVA)  Gregory Nunez. 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 He is admitted for an upper GI bleed.  Plan: Acute on chronic anemia 2/2 UGI Bleed Hx of Duodenal AVM's Hx IDA/Anemia of Chronic Disease GI following, thank you. EGD yesterday with 6 nonbleeding ABMs treated with APC. Hg now stable in upper 7s. No bleeding reported. Will advance his diet and observe.  - Recs per GI - PPI 40 twice daily - Hold ASA   CKD stage IV/V Elevated Creatinine Improving. Suspect he is near a new baseline. - Trend RFP - he received fluid resuscitation at admission - Losartan  has been  held since last admission, continue holding that and other nephrotoxic medications   Hypertension Mildly hypertensive.  Will resume oral amlodipine . Add hydralazine  if needed.   History of CVA (L infarct in the L thalamus and parietal white matter 09/2023) HLD Cognitive Impairment Cognitive impairment which has been attributed to chronic vascular disease further c/b recent stroke.  He appears at baseline. -Resume home Lipitor  80 mg   Protein calorie malnutrition Thiamine  deficiency -Resume home thiamine  -Nepro Shakes when not NPO  Best Practice: Diet: Heart healthy VTE: SCDs Start: 10/14/23 0028 Code: Full Therapy Recs: Pending DISPO: Anticipated discharge TBD to Skilled nursing facility pending auth and bleeding stability.  Signature: Lonni Africa, D.O.  Internal Medicine Resident, PGY-1 [facility]  Pager: # [phone]. 11:00 AM, 10/15/2023

## 2023-10-15 NOTE — Evaluation (Signed)
 Occupational Therapy Evaluation Patient Details Name: Gregory Nunez. MRN: [id number] DOB: [birth year] Today's Date: 10/15/2023   History of Present Illness   Pt is 78 yo presenting from [facility] for hgb of 6.3 admitted for upper GI bleed. PMH: CKD, ischemic CVA, dysarthria, Htn, cognitive impairment.     Clinical Impressions Pt admitted based on above, and was seen based on problem list below. Pt presented from [facility], completing short term rehab. Today pt is requiring set up  to min  assist for ADLs. Cog is primary limiting factor. Bed mobility and functional transfers are  CGA. Pt confused and with dysarthritic speech, anxious regarding mobility. Requiring max encouragement to complete STS.  Pt would benefit from returning to [facility] to complete rehab in familiar environment. OT will continue to follow acutely to maximize functional independence.        If plan is discharge home, recommend the following:   A little help with walking and/or transfers;A lot of help with bathing/dressing/bathroom;Assistance with cooking/housework;Direct supervision/assist for medications management;Assist for transportation;Direct supervision/assist for financial management;Help with stairs or ramp for entrance     Functional Status Assessment   Patient has had a recent decline in their functional status and demonstrates the ability to make significant improvements in function in a reasonable and predictable amount of time.     Equipment Recommendations   Other (comment) (Defer to next venue)     Recommendations for Other Services         Precautions/Restrictions   Precautions Precautions: Fall Recall of Precautions/Restrictions: Impaired Restrictions Weight Bearing Restrictions Per Provider Order: No     Mobility Bed Mobility Overal bed mobility: Needs Assistance Bed Mobility: Supine to Sit, Sit to Supine      Supine to sit: Contact guard Sit to supine: Supervision   General bed mobility comments: vc's for safety    Transfers Overall transfer level: Needs assistance Equipment used: 1 person hand held assist Transfers: Sit to/from Stand Sit to Stand: Min assist     General transfer comment: Min A from EOB with encouragement.      Balance Overall balance assessment: Needs assistance Sitting-balance support: Feet supported, Feet unsupported, Single extremity supported Sitting balance-Leahy Scale: Fair   Postural control: Posterior lean Standing balance support: Single extremity supported Standing balance-Leahy Scale: Poor Standing balance comment: Requires external support.         ADL either performed or assessed with clinical judgement   ADL Overall ADL's : Needs assistance/impaired Eating/Feeding: Set up;Sitting   Grooming: Set up;Sitting   Upper Body Bathing: Set up;Sitting   Lower Body Bathing: Minimal assistance;Sit to/from stand   Upper Body Dressing : Set up;Sitting   Lower Body Dressing: Minimal assistance;Sit to/from stand   Toilet Transfer: Minimal assistance;Stand-pivot   Toileting- Clothing Manipulation and Hygiene: Minimal assistance;Sit to/from stand       Functional mobility during ADLs: Minimal assistance General ADL Comments: Cog primary limiting factor     Vision Baseline Vision/History: 0 No visual deficits Vision Assessment?: No apparent visual deficits            Pertinent Vitals/Pain Pain Assessment Pain Assessment: No/denies pain Pain Intervention(s): Monitored during session     Extremity/Trunk Assessment Upper Extremity Assessment Upper Extremity Assessment: Generalized weakness   Lower Extremity Assessment Lower Extremity Assessment: Defer to PT evaluation   Cervical / Trunk Assessment Cervical / Trunk Assessment: Kyphotic   Communication Communication Communication: Impaired Factors Affecting Communication: Difficulty  expressing self  Cognition Arousal: Alert Behavior During Therapy: Agitated, Anxious Cognition: History of cognitive impairments, Cognition impaired   Orientation impairments: Time, Situation Awareness: Intellectual awareness impaired, Online awareness impaired Memory impairment (select all impairments): Short-term memory, Working memory Attention impairment (select first level of impairment): Sustained attention Executive functioning impairment (select all impairments): Sequencing, Reasoning, Problem solving OT - Cognition Comments: Pt with dysarthria and intermittent difficulty getting words out with garbled speech. pt aware he has been transferred to multiple places but unable to state where he is                 Following commands: Impaired Following commands impaired: Follows one step commands inconsistently, Follows one step commands with increased time     Cueing  General Comments   Cueing Techniques: Verbal cues;Gestural cues;Tactile cues;Visual cues  Pt anxious regardging mobility requires max encouragement           Home Living Family/patient expects to be discharged to:: Skilled nursing facility Living Arrangements: Other (Comment) (SNF) Available Help at Discharge: Family;Available PRN/intermittently Type of Home: Apartment Home Access: Elevator     Home Layout: One level     Bathroom Shower/Tub: Producer, television/film/video: Standard Bathroom Accessibility: Yes   Home Equipment: Grab bars - tub/shower;Cane - single point;Shower seat   Additional Comments: No family present today. information taken from previous chart.  Lives With: Alone    Prior Functioning/Environment Prior Level of Function : Patient poor historian/Family not available;History of Falls (last six months)       ADLs Comments: per chart, does not drive or cook; pt with inconsistent report of whether he had started therapy at [facility] rehab    OT Problem List: Decreased  strength;Decreased activity tolerance;Impaired balance (sitting and/or standing);Decreased coordination;Impaired UE functional use;Decreased safety awareness;Decreased cognition   OT Treatment/Interventions: Self-care/ADL training;Therapeutic activities;Patient/family education;DME and/or AE instruction;Balance training      OT Goals(Current goals can be found in the care plan section)   Acute Rehab OT Goals OT Goal Formulation: Patient unable to participate in goal setting Time For Goal Achievement: 10/29/23 Potential to Achieve Goals: Fair   OT Frequency:  Min 2X/week    Co-evaluation PT/OT/SLP Co-Evaluation/Treatment: Yes Reason for Co-Treatment: To address functional/ADL transfers;Necessary to address cognition/behavior during functional activity   OT goals addressed during session: ADL's and self-care      AM-PAC OT 6 Clicks Daily Activity     Outcome Measure Help from another person eating meals?: A Little Help from another person taking care of personal grooming?: A Little Help from another person toileting, which includes using toliet, bedpan, or urinal?: A Lot Help from another person bathing (including washing, rinsing, drying)?: A Lot Help from another person to put on and taking off regular upper body clothing?: A Little Help from another person to put on and taking off regular lower body clothing?: A Lot 6 Click Score: 15   End of Session Equipment Utilized During Treatment: Gait belt;Rolling walker (2 wheels) Nurse Communication: Mobility status  Activity Tolerance: Patient tolerated treatment well Patient left: in chair;with call bell/phone within reach;with chair alarm set  OT Visit Diagnosis: Unsteadiness on feet (R26.81);Muscle weakness (generalized) (M62.81);Ataxia, unspecified (R27.0)                Time: 8968-8952 OT Time Calculation (min): 16 min Charges:  OT General Charges $OT Visit: 1 Visit OT Evaluation $OT Eval Moderate Complexity: 1  Mod  Davion Meara C, OT  Acute Rehabilitation Services Office [phone] Secure chat preferred  Adrianne GORMAN Savers 10/15/2023, 11:05 AM

## 2023-10-15 NOTE — Evaluation (Signed)
 Physical Therapy Evaluation Patient Details Name: Gregory Nunez. MRN: [id number] DOB: Apr 26, 1945 Today's Date: 10/15/2023  History of Present Illness  Pt is 78 yo presenting from [facility] for hgb of 6.3 admitted for upper GI bleed. PMH: CKD, ischemic CVA, dysarthria, Htn, cognitive impairment.  Clinical Impression  Pt is presenting below previous baseline level of functioning. Pt presented from [facility] center. Currently pt is CGA to supervision for bed mobility and Min A for standing EOB. Pt was unable to progress gait today due to fatigue and anxiety. Due to pt current functional status, home set up and available assistance at home recommending skilled physical therapy services < 3 hours/day in order to address strength, balance and functional mobility to decrease risk for falls, injury, immobility, skin break down and re-hospitalization.          If plan is discharge home, recommend the following: Help with stairs or ramp for entrance;Assist for transportation;Assistance with cooking/housework;Supervision due to cognitive status;A lot of help with walking and/or transfers     Equipment Recommendations Wheelchair cushion (measurements PT);Wheelchair (measurements PT);Hospital bed;BSC/3in1     Functional Status Assessment Patient has had a recent decline in their functional status and demonstrates the ability to make significant improvements in function in a reasonable and predictable amount of time.     Precautions / Restrictions Precautions Precautions: Fall Recall of Precautions/Restrictions: Impaired Restrictions Weight Bearing Restrictions Per Provider Order: No      Mobility  Bed Mobility Overal bed mobility: Needs Assistance Bed Mobility: Supine to Sit, Sit to Supine     Supine to sit: Contact guard Sit to supine: Supervision   General bed mobility comments: vc's for safety    Transfers Overall transfer level: Needs  assistance Equipment used: 1 person hand held assist Transfers: Sit to/from Stand Sit to Stand: Min assist           General transfer comment: Min A from EOB with encouragement.    Ambulation/Gait     General Gait Details: due to fatigue unable to perform today     Balance Overall balance assessment: Needs assistance Sitting-balance support: Feet supported, Feet unsupported, Single extremity supported Sitting balance-Leahy Scale: Fair   Postural control: Posterior lean Standing balance support: Single extremity supported Standing balance-Leahy Scale: Poor Standing balance comment: Requires external support.         Pertinent Vitals/Pain Pain Assessment Pain Assessment: No/denies pain Pain Intervention(s): Monitored during session    Home Living Family/patient expects to be discharged to:: Skilled nursing facility Living Arrangements: Other (Comment) (SNF) Available Help at Discharge: Family;Available PRN/intermittently Type of Home: Apartment Home Access: Elevator       Home Layout: One level Home Equipment: Grab bars - tub/shower;Cane - single point;Shower seat Additional Comments: No family present today. information taken from previous chart.    Prior Function Prior Level of Function : Patient poor historian/Family not available;History of Falls (last six months)               ADLs Comments: per chart, does not drive or cook; pt with inconsistent report of whether he had started therapy at [facility] rehab     Extremity/Trunk Assessment   Upper Extremity Assessment Upper Extremity Assessment: Generalized weakness;Defer to OT evaluation    Lower Extremity Assessment Lower Extremity Assessment: Generalized weakness    Cervical / Trunk Assessment Cervical / Trunk Assessment: Kyphotic  Communication   Communication Communication: Impaired Factors Affecting Communication: Difficulty expressing self    Cognition Arousal: Alert  Behavior During  Therapy: Agitated, Anxious   PT - Cognitive impairments: Difficult to assess Difficult to assess due to: Impaired communication       PT - Cognition Comments: Difficult to assess cognition due to pt's expressive language deficits. Follows simple multi-modal cues ~50% of the time with consisency and encouragement. Following commands: Impaired Following commands impaired: Follows one step commands inconsistently, Follows one step commands with increased time     Cueing Cueing Techniques: Verbal cues, Gestural cues, Tactile cues, Visual cues     General Comments General comments (skin integrity, edema, etc.): Pt nervous about getting out of bed. Required encouragement and education. More agreeable with functional tasks provided.        Assessment/Plan    PT Assessment Patient needs continued PT services  PT Problem List Decreased strength;Decreased balance;Decreased mobility;Decreased coordination;Decreased activity tolerance;Decreased cognition       PT Treatment Interventions DME instruction;Gait training;Patient/family education;Functional mobility training;Therapeutic exercise;Therapeutic activities;Balance training;Neuromuscular re-education;Cognitive remediation    PT Goals (Current goals can be found in the Care Plan section)  Acute Rehab PT Goals PT Goal Formulation: Patient unable to participate in goal setting Time For Goal Achievement: 10/29/23 Potential to Achieve Goals: Fair    Frequency Min 2X/week        AM-PAC PT 6 Clicks Mobility  Outcome Measure Help needed turning from your back to your side while in a flat bed without using bedrails?: A Little Help needed moving from lying on your back to sitting on the side of a flat bed without using bedrails?: A Little Help needed moving to and from a bed to a chair (including a wheelchair)?: A Little Help needed standing up from a chair using your arms (e.g., wheelchair or bedside chair)?: A Little Help needed to  walk in hospital room?: A Lot Help needed climbing 3-5 steps with a railing? : Total 6 Click Score: 15    End of Session Equipment Utilized During Treatment: Gait belt Activity Tolerance: Patient tolerated treatment well Patient left: in bed;with call bell/phone within reach;with bed alarm set Nurse Communication: Mobility status PT Visit Diagnosis: Other abnormalities of gait and mobility (R26.89);Muscle weakness (generalized) (M62.81);Other symptoms and signs involving the nervous system (R29.898);Unsteadiness on feet (R26.81);Difficulty in walking, not elsewhere classified (R26.2)    Time: 8964-8952 PT Time Calculation (min) (ACUTE ONLY): 12 min   Charges:   PT Evaluation $PT Eval Low Complexity: 1 Low   PT General Charges $$ ACUTE PT VISIT: 1 Visit         Dorothyann Maier, DPT, CLT  Acute Rehabilitation Services Office: [phone] (Secure chat preferred)   Dorothyann VEAR Maier 10/15/2023, 10:55 AM

## 2023-10-15 NOTE — TOC Progression Note (Signed)
 Per MD, pt medically stable for dc back to SNF today. Spoke to [place] with [facility] admissions who reports pt will need new auth to return. PT/OT evals requested. MD aware of barrier to dc.   Julien Das, MSW, LCSW [phone] (coverage)

## 2023-10-16 ENCOUNTER — Encounter (HOSPITAL_COMMUNITY): Payer: Self-pay | Admitting: Gastroenterology

## 2023-10-16 DIAGNOSIS — Z7902 Long term (current) use of antithrombotics/antiplatelets: Secondary | ICD-10-CM | POA: Diagnosis not present

## 2023-10-16 DIAGNOSIS — K31819 Angiodysplasia of stomach and duodenum without bleeding: Secondary | ICD-10-CM

## 2023-10-16 DIAGNOSIS — D649 Anemia, unspecified: Secondary | ICD-10-CM | POA: Diagnosis not present

## 2023-10-16 DIAGNOSIS — I639 Cerebral infarction, unspecified: Secondary | ICD-10-CM

## 2023-10-16 DIAGNOSIS — N184 Chronic kidney disease, stage 4 (severe): Secondary | ICD-10-CM

## 2023-10-16 LAB — BASIC METABOLIC PANEL WITH GFR
Anion gap: 12 (ref 5–15)
BUN: 77 mg/dL — ABNORMAL HIGH (ref 8–23)
CO2: 22 mmol/L (ref 22–32)
Calcium: 8.6 mg/dL — ABNORMAL LOW (ref 8.9–10.3)
Chloride: 104 mmol/L (ref 98–111)
Creatinine, Ser: 4.22 mg/dL — ABNORMAL HIGH (ref 0.61–1.24)
GFR, Estimated: 14 mL/min — ABNORMAL LOW (ref >60)
Glucose, Bld: 121 mg/dL — ABNORMAL HIGH (ref 70–99)
Potassium: 4.5 mmol/L (ref 3.5–5.1)
Sodium: 138 mmol/L (ref 135–145)

## 2023-10-16 LAB — CBC
HCT: 24.1 % — ABNORMAL LOW (ref 39.0–52.0)
Hemoglobin: 8.1 g/dL — ABNORMAL LOW (ref 13.0–17.0)
MCH: 30.8 pg (ref 26.0–34.0)
MCHC: 33.6 g/dL (ref 30.0–36.0)
MCV: 91.6 fL (ref 80.0–100.0)
Platelets: 160 10*3/uL (ref 150–400)
RBC: 2.63 MIL/uL — ABNORMAL LOW (ref 4.22–5.81)
RDW: 15.3 % (ref 11.5–15.5)
WBC: 5.1 10*3/uL (ref 4.0–10.5)
nRBC: 0 % (ref 0.0–0.2)

## 2023-10-16 MED ORDER — ASPIRIN 81 MG PO TBEC
81.0000 mg | DELAYED_RELEASE_TABLET | Freq: Every day | ORAL | Status: DC
  Administered 2023-10-16 – 2023-10-17 (×2): 81 mg via ORAL
  Filled 2023-10-16 (×2): qty 1

## 2023-10-16 NOTE — Discharge Summary (Shared)
 Name: Gregory Nunez. MRN: [id number] DOB: 08/27/1945 78 y.o. PCP: Charmayne Holmes, DO  Date of Admission: 10/13/2023  9:55 PM Date of Discharge: No discharge date for patient encounter. Attending Physician: {[email or web address]}  Discharge Diagnosis: 1. Principal Problem:   Acute on chronic blood loss anemia Active Problems:   Hypertension associated with diabetes (HCC)   Cerebrovascular accident (CVA) (HCC)   Stage 4 chronic kidney disease (HCC)   AVM (arteriovenous malformation) of small bowel, acquired   Protein-calorie malnutrition (HCC)   Antiplatelet or antithrombotic long-term use   Upper GI bleeding   Recent cerebrovascular accident (CVA)   Acute on chronic anemia  ***  Discharge Medications: Allergies as of 10/16/2023       Reactions   Penicillins Other (See Comments)   Pt does not remember reaction but states he woke up in the hospital after taking     Med Rec must be completed prior to using this [facility]***       Disposition and follow-up:   [name] Nunez. was discharged from [facility] in {DISCHARGE CONDITION:19696} condition.  At the hospital follow up visit please address:  1.  ***  2.  Labs / imaging needed at time of follow-up: ***  3.  Pending labs/ test needing follow-up: ***  Follow-up Appointments:  Contact information for after-discharge care     Destination     [facility] .   Service: Skilled Nursing Contact information: [address] [place]  [address] [phone]                      Hospital Course by problem list: [name]. is a 78 y.o. person living with a history of *** who presented with *** and admitted for ***  now bring discharge on hospital day 1 with the following pertinent hospital course:  Gregory Nunez. 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 Admitted for acute on chronic anemia 2/2 GI bleed.   Acute on chronic anemia UGI Bleed Hx of Duodenal AVM's Anemia of Chronic Disease IDA Hgb 5.8 on admission and melena. He received 2u PRBCs. This was a recurrent bleed from earlier this month. Unfortunately his recent stroke with need of DAPT is contributing. Enteroscopy revealed 6 non-bleeding AVMs in the small bowel. He will need PPI scheduled *** going forward and GI intends octreotide depot as he will need ongoing plavix  for his CVAs.  CKD stage IV/V Elevated Creatinine Mild elevation in creatinine though interpretation limited given advanced CKD. This may reflect new baseline. - He needs outpatient Nephrology follow-up   History of CVA (L infarct in the L thalamus and parietal white matter 09/2023) HLD Cognitive Impairment Cognitive impairment which has been attributed to chronic vascular disease further c/b recent stroke.  He appears at baseline. -Resume home Lipitor  80 mg   Protein calorie malnutrition Thiamine  deficiency -Resume home thiamine  -[employer]   Stable chronic medical conditions: *** *** ***   Discharge Exam:   BP (!) 147/69 (BP Location: Left Arm)   Pulse 88   Temp 98.8 F (37.1 C) (Oral)   Resp 16   Ht 6' (1.829 m)   Wt 66.3 kg   SpO2 100%   BMI 19.82 kg/m  Discharge  exam: ***  Pertinent Labs, Studies, and Procedures:     Latest Ref Rng & Units 10/16/2023   11:17 AM 10/15/2023    5:16 AM 10/14/2023    6:52 AM  CBC  WBC 4.0 - 10.5 K/uL 5.1  6.2  5.0   Hemoglobin 13.0 - 17.0 g/dL 8.1  7.9  7.8   Hematocrit 39.0 - 52.0 % 24.1  23.3  23.4   Platelets 150 - 400 K/uL 160  149  141        Latest Ref Rng & Units 10/16/2023   11:17 AM 10/15/2023    5:16 AM 10/14/2023    6:52 AM  CMP  Glucose 70 - 99 mg/dL 878  92  79   BUN 8 - 23 mg/dL 77  82   94   Creatinine 0.61 - 1.24 mg/dL 5.77  5.97  5.85   Sodium 135 - 145 mmol/L 138  143  142   Potassium 3.5 - 5.1 mmol/L 4.5  4.2  4.0   Chloride 98 - 111 mmol/L 104  112  109   CO2 22 - 32 mmol/L 22  23  20    Calcium  8.9 - 10.3 mg/dL 8.6  8.7  8.7     No results found.   Discharge Instructions:   Signed: Elnora Ip, MD 10/16/2023, 4:38 PM

## 2023-10-16 NOTE — Plan of Care (Signed)

## 2023-10-16 NOTE — TOC Progression Note (Signed)
 Home and Community/[employer] auth request submitted for [facility], reference [id number]. Anticipate determination by tomorrow. [facility] is able to accept pt back pending new auth.   Julien Das, MSW, LCSW [phone] (coverage)

## 2023-10-16 NOTE — Progress Notes (Signed)
 HD#1 SUBJECTIVE:  Patient Summary: Gregory Hernon. is a 78 year old male with history of CKD IV/V, ischemic CVA (infarct in the L thalamus and parietal white matter 09/2023) with residual dysarthria, HTN, cognitive impairment who was brought in from [facility] for a hgb of 6.3. He is admitted for UGIB s/p duodenal AVM treated with APC, with ASA restarted on 6/29, awaiting SNF authorization for discharge  Overnight Events and Interim History:  No acute events. Tolerated Regular diet without discomfort. Would like to go to SNF soon  Significant Hospital Events:  6/27 Enteroscopy with 6 nonbleeding AVMs in small bowel treated APC 6/28 regular diet 6/29 ASA 81  mg restarted  OBJECTIVE:  Vital Signs: Vitals:   10/15/23 1526 10/15/23 2025 10/16/23 0340 10/16/23 0743  BP: 133/68 136/80 128/78 (!) 147/69  Pulse: 91 87 93 88  Resp: 18 18 18 16   Temp: 99.4 F (37.4 C) 98.2 F (36.8 C) 99 F (37.2 C) 98.8 F (37.1 C)  TempSrc: Oral Oral Oral Oral  SpO2: 100% 99% 100% 100%  Weight:      Height:       Supplemental O2: Room Air SpO2: 100 %  Filed Weights   10/14/23 0946 10/14/23 1216  Weight: 66.3 kg 66.3 kg     Intake/Output Summary (Last 24 hours) at 10/16/2023 1055 Last data filed at 10/15/2023 1400 Gross per 24 hour  Intake --  Output 600 ml  Net -600 ml   Net IO Since Admission: -452.9 mL [10/16/23 1055]  Physical Exam: General: Pleasant, well-appearing man laying in bed. No acute distress. Head: Normocephalic. Atraumatic. CV: RRR. No murmurs, rubs, or gallops. No LE edema Pulmonary: Lungs CTAB. Normal effort. No wheezing or rales. Abdominal: Soft, nontender, nondistended. Normal bowel sounds. Extremities: Palpable radial and DP pulses.  Skin: Warm and dry.  Neuro: A&Ox to self and place, baseline Psych: Normal mood and affect   Patient Lines/Drains/Airways Status     Active Line/Drains/Airways     Name Placement date Placement time Site Days    Peripheral IV 10/14/23 22 G 1 Anterior;Right Forearm 10/14/23  1012  Forearm  1   External Urinary Catheter 10/14/23  0955  --  1            ASSESSMENT/PLAN:  Assessment: Principal Problem:   Acute on chronic blood loss anemia Active Problems:   Hypertension associated with diabetes (HCC)   Cerebrovascular accident (CVA) (HCC)   Chronic kidney disease (CKD), stage IV (severe) (HCC)   AVM (arteriovenous malformation) of small bowel, acquired   Protein-calorie malnutrition (HCC)   Antiplatelet or antithrombotic long-term use   Upper GI bleeding   Recent cerebrovascular accident (CVA)   Acute on chronic anemia   Plan: Acute on chronic anemia 2/2 UGI Bleed Hx of Duodenal AVM's Hx IDA/Anemia of Chronic Disease GI following, thank you. EGD yesterday with 6 nonbleeding AVM treated with APC. No bleeding reported.  - Follow up Hgb today - PPI 40 twice daily - Re-start ASA 81 mg daily - Monitor Hgb in 7 days - Will need outpatient outpatient octreotide depot therapy once monthly to prevent AVM bleeding if covered by insurance - If re-bleeds, capsule endoscopy   CKD stage IV/V Improving BUN and Cr as of 6/28.  Would benefit of restarting ARB once BUN normalizes - Hgb monitoring as above - RFP monitoring; will follow up today   Hypertension On amlodipine  - Holding Hydralazine  and ARB   History of CVA (L infarct in the L  thalamus and parietal white matter 09/2023) HLD Cognitive Impairment Cognitive impairment which has been attributed to chronic vascular disease further c/b recent stroke.  He appears at baseline. - Resume home Lipitor  80 mg - Restarted ASA 81 mg daily   Protein calorie malnutrition Thiamine  deficiency - On home thiamine   Best Practice: Diet: Heart healthy VTE: SCDs Start: 10/14/23 0028 Code: Full Therapy Recs: SNF DISPO: Anticipated discharge TBD to Skilled nursing facility pending  Authorization from SNF  Signature: Hadassah Kristy Ahr,  MD [facility] Internal Medicine Program - PGY-2 10:55 AM, 10/16/2023

## 2023-10-16 NOTE — Progress Notes (Addendum)
      Progress Note   Subjective  No further bleeding, tolerating diet.    Objective   Vital signs in last 24 hours: Temp:  [98.2 F (36.8 C)-99.4 F (37.4 C)] 98.8 F (37.1 C) (06/29 0743) Pulse Rate:  [87-93] 88 (06/29 0743) Resp:  [16-18] 16 (06/29 0743) BP: (128-147)/(68-80) 147/69 (06/29 0743) SpO2:  [99 %-100 %] 100 % (06/29 0743) Last BM Date :  (PTA) General:    AA male in NAD Psych:  Cooperative. Normal mood and affect.  Intake/Output from previous day: 06/28 0701 - 06/29 0700 In: 120 [P.O.:120] Out: 1000 [Urine:1000] Intake/Output this shift: No intake/output data recorded.  Lab Results: Recent Labs    10/13/23 2255 10/14/23 0652 10/15/23 0516  WBC 5.5 5.0 6.2  HGB 5.8* 7.8* 7.9*  HCT 17.6* 23.4* 23.3*  PLT 161 141* 149*   BMET Recent Labs    10/14/23 0044 10/14/23 0652 10/15/23 0516  NA 139 142 143  K 4.4 4.0 4.2  CL 111 109 112*  CO2 19* 20* 23  GLUCOSE 153* 79 92  BUN 102* 94* 82*  CREATININE 4.31* 4.14* 4.02*  CALCIUM  8.4* 8.7* 8.7*   LFT Recent Labs    10/13/23 2255 10/14/23 0044 10/15/23 0516  PROT 6.7  --   --   ALBUMIN 2.8*  2.9*   < > 2.7*  AST 50*  --   --   ALT 37  --   --   ALKPHOS 52  --   --   BILITOT 0.8  --   --    < > = values in this interval not displayed.   PT/INR No results for input(s): LABPROT, INR in the last 72 hours.  Studies/Results: No results found.     Assessment / Plan:    78 y/o male here with the following:   Acute on chronic anemia - secondary to small bowel AVMs Recent CVA - on Plavix  and aspirin    Enteroscopy 6/27, multiple small bowel AVMs ablated. He has now had several AVMs treated in the past 2 weeks on 2 procedures. No evidence of post APC related ulcer bleeding.  Hemoglobin stable yesterday post procedure. Ordered today and not done yet.   Okay to resume aspirin  or Plavix  (if this was recommended for his CVA). If Hgb stable today then I think okay to discharge. Would ask  pharmacy to see if he is a candidate for octreotide therapy as outpatient, this will help reduce his risk for rebleeding / transfusion.   We will coordinate a follow up lab at our office next week if he can make a lab draw. If going to SNF hopefully they can check it there.    PLAN: - continue diet - okay to resume Plavix  if needed - monitor Hgb and for rebleeding, if stable I think okay to be discharged - if further bleeding, will need capsule endoscopy - recommend octreotide depot as outpatient if no contraindications and covered by insurance - repeat Hgb in the next few days at SNF or our office if he can come to the lab, will discuss with primary team  We will sign off for now, please call with questions in the interim.   Marcey Naval, MD [facility]

## 2023-10-16 NOTE — Plan of Care (Signed)
  Problem: Education: Goal: Knowledge of General Education information will improve Description: Including pain rating scale, medication(s)/side effects and non-pharmacologic comfort measures 10/16/2023 0702 by Carmela Wadie BROCKS, RN Outcome: Progressing 10/16/2023 0702 by Carmela Wadie BROCKS, RN Outcome: Progressing   Problem: Health Behavior/Discharge Planning: Goal: Ability to manage health-related needs will improve 10/16/2023 0702 by Carmela Wadie BROCKS, RN Outcome: Progressing 10/16/2023 0702 by Carmela Wadie BROCKS, RN Outcome: Progressing   Problem: Clinical Measurements: Goal: Ability to maintain clinical measurements within normal limits will improve 10/16/2023 0702 by Carmela Wadie BROCKS, RN Outcome: Progressing 10/16/2023 0702 by Carmela Wadie BROCKS, RN Outcome: Progressing Goal: Will remain free from infection 10/16/2023 0702 by Carmela Wadie BROCKS, RN Outcome: Progressing 10/16/2023 0702 by Carmela Wadie BROCKS, RN Outcome: Progressing Goal: Diagnostic test results will improve 10/16/2023 0702 by Carmela Wadie BROCKS, RN Outcome: Progressing 10/16/2023 0702 by Carmela Wadie BROCKS, RN Outcome: Progressing Goal: Respiratory complications will improve 10/16/2023 0702 by Carmela Wadie BROCKS, RN Outcome: Progressing 10/16/2023 0702 by Carmela Wadie BROCKS, RN Outcome: Progressing Goal: Cardiovascular complication will be avoided 10/16/2023 0702 by Carmela Wadie BROCKS, RN Outcome: Progressing 10/16/2023 0702 by Carmela Wadie BROCKS, RN Outcome: Progressing   Problem: Activity: Goal: Risk for activity intolerance will decrease 10/16/2023 0702 by Carmela Wadie BROCKS, RN Outcome: Progressing 10/16/2023 0702 by Carmela Wadie BROCKS, RN Outcome: Progressing   Problem: Nutrition: Goal: Adequate nutrition will be maintained 10/16/2023 0702 by Carmela Wadie BROCKS, RN Outcome: Progressing 10/16/2023 0702 by Carmela Wadie BROCKS, RN Outcome: Progressing   Problem: Coping: Goal: Level of anxiety will decrease 10/16/2023  0702 by Carmela Wadie BROCKS, RN Outcome: Progressing 10/16/2023 0702 by Carmela Wadie BROCKS, RN Outcome: Progressing   Problem: Elimination: Goal: Will not experience complications related to bowel motility 10/16/2023 0702 by Carmela Wadie BROCKS, RN Outcome: Progressing 10/16/2023 0702 by Carmela Wadie BROCKS, RN Outcome: Progressing Goal: Will not experience complications related to urinary retention 10/16/2023 0702 by Carmela Wadie BROCKS, RN Outcome: Progressing 10/16/2023 0702 by Carmela Wadie BROCKS, RN Outcome: Progressing   Problem: Pain Managment: Goal: General experience of comfort will improve and/or be controlled 10/16/2023 0702 by Carmela Wadie BROCKS, RN Outcome: Progressing 10/16/2023 0702 by Carmela Wadie BROCKS, RN Outcome: Progressing   Problem: Safety: Goal: Ability to remain free from injury will improve 10/16/2023 0702 by Carmela Wadie BROCKS, RN Outcome: Progressing 10/16/2023 0702 by Carmela Wadie BROCKS, RN Outcome: Progressing   Problem: Skin Integrity: Goal: Risk for impaired skin integrity will decrease 10/16/2023 0702 by Carmela Wadie BROCKS, RN Outcome: Progressing 10/16/2023 0702 by Carmela Wadie BROCKS, RN Outcome: Progressing

## 2023-10-17 ENCOUNTER — Other Ambulatory Visit (HOSPITAL_COMMUNITY): Payer: Self-pay

## 2023-10-17 ENCOUNTER — Telehealth (HOSPITAL_COMMUNITY): Payer: Self-pay | Admitting: Pharmacy Technician

## 2023-10-17 ENCOUNTER — Encounter: Payer: Self-pay | Admitting: Nurse Practitioner

## 2023-10-17 DIAGNOSIS — D500 Iron deficiency anemia secondary to blood loss (chronic): Secondary | ICD-10-CM

## 2023-10-17 DIAGNOSIS — K922 Gastrointestinal hemorrhage, unspecified: Secondary | ICD-10-CM

## 2023-10-17 DIAGNOSIS — I129 Hypertensive chronic kidney disease with stage 1 through stage 4 chronic kidney disease, or unspecified chronic kidney disease: Secondary | ICD-10-CM

## 2023-10-17 LAB — RENAL FUNCTION PANEL
Albumin: 2.8 g/dL — ABNORMAL LOW (ref 3.5–5.0)
Anion gap: 9 (ref 5–15)
BUN: 85 mg/dL — ABNORMAL HIGH (ref 8–23)
CO2: 20 mmol/L — ABNORMAL LOW (ref 22–32)
Calcium: 8.7 mg/dL — ABNORMAL LOW (ref 8.9–10.3)
Chloride: 110 mmol/L (ref 98–111)
Creatinine, Ser: 4.63 mg/dL — ABNORMAL HIGH (ref 0.61–1.24)
GFR, Estimated: 12 mL/min — ABNORMAL LOW (ref >60)
Glucose, Bld: 102 mg/dL — ABNORMAL HIGH (ref 70–99)
Phosphorus: 5.4 mg/dL — ABNORMAL HIGH (ref 2.5–4.6)
Potassium: 4.7 mmol/L (ref 3.5–5.1)
Sodium: 139 mmol/L (ref 135–145)

## 2023-10-17 LAB — CBC
HCT: 25.3 % — ABNORMAL LOW (ref 39.0–52.0)
Hemoglobin: 8.4 g/dL — ABNORMAL LOW (ref 13.0–17.0)
MCH: 30.2 pg (ref 26.0–34.0)
MCHC: 33.2 g/dL (ref 30.0–36.0)
MCV: 91 fL (ref 80.0–100.0)
Platelets: 171 10*3/uL (ref 150–400)
RBC: 2.78 MIL/uL — ABNORMAL LOW (ref 4.22–5.81)
RDW: 15.2 % (ref 11.5–15.5)
WBC: 5.2 10*3/uL (ref 4.0–10.5)
nRBC: 0 % (ref 0.0–0.2)

## 2023-10-17 LAB — GLUCOSE, CAPILLARY: Glucose-Capillary: 90 mg/dL (ref 70–99)

## 2023-10-17 MED ORDER — VITAMIN B-1 100 MG PO TABS: 100.0000 mg | ORAL_TABLET | Freq: Every day | ORAL | Status: AC

## 2023-10-17 MED ORDER — OCTREOTIDE ACETATE 20 MG IM KIT
20.0000 mg | PACK | INTRAMUSCULAR | 2 refills | Status: AC
Start: 2023-10-17 — End: ?
  Filled 2023-10-17 – 2023-11-18 (×2): qty 1, 28d supply, fill #0
  Filled 2023-12-21: qty 1, 28d supply, fill #1

## 2023-10-17 MED ORDER — HYDRALAZINE HCL 25 MG PO TABS
25.0000 mg | ORAL_TABLET | Freq: Three times a day (TID) | ORAL | Status: DC
  Administered 2023-10-17 (×2): 25 mg via ORAL
  Filled 2023-10-17 (×2): qty 1

## 2023-10-17 MED ORDER — PANTOPRAZOLE SODIUM 40 MG PO TBEC: 40.0000 mg | DELAYED_RELEASE_TABLET | Freq: Two times a day (BID) | ORAL | Status: AC

## 2023-10-17 NOTE — [place] FL2 (Signed)
 [place]  MEDICAID FL2 LEVEL OF CARE FORM     IDENTIFICATION  Patient Name: Gregory Nunez. Birthdate: May 05, 1945 Sex: male Admission Date (Current Location): 10/13/2023  [facility] and [place] Number:  Producer, television/film/video and Address:  [facility], [address] [place], [place] [address]      Provider Number: [id number]  Attending Physician Name and Address:  Eben Reyes BROCKS, MD  Relative Name and Phone Number:       Current Level of Care: Hospital Recommended Level of Care: Skilled Nursing Facility Prior Approval Number:    Date Approved/Denied:   PASRR Number: [id number]  Discharge Plan: SNF    Current Diagnoses: Patient Active Problem List   Diagnosis Date Noted   Acute on chronic anemia 10/15/2023   Protein-calorie malnutrition (HCC) 10/14/2023   Thiamine  deficiency 10/14/2023   Antiplatelet or antithrombotic long-term use 10/14/2023   Upper GI bleeding 10/14/2023   Recent cerebrovascular accident (CVA) 10/14/2023   Gastritis and gastroduodenitis 10/03/2023   Duodenal erosion 10/03/2023   AVM (arteriovenous malformation) of small bowel, 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   GI bleed 10/01/2023   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   Cerebrovascular accident (CVA) (HCC) 09/22/2023   Encephalopathy 09/22/2023   Aphasia 09/22/2023   Neutropenia (HCC) 09/22/2023   Stage 4 chronic kidney disease (HCC) 09/22/2023   Essential hypertension 09/22/2023   Confusion with non-focal neuro exam 08/17/2023   Acute on chronic blood loss anemia 06/13/2023   Chronic active hepatitis C (HCC) 11/03/2022   History of epistaxis 12/04/2021   Debility 04/15/2021   History of alcohol abuse 04/13/2021   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/12/2021   Weakness of both legs    Adenomatous duodenal polyp    AVM (arteriovenous malformation) of duodenum, acquired with hemorrhage    Angiodysplasia of intestine    Iron  deficiency anemia  05/22/2020   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 (HCC)    History of CVA (cerebrovascular accident) 04/23/2020   Hyperlipidemia associated with type 2 diabetes mellitus (HCC) 04/23/2020   CKD (chronic kidney disease) stage 4, GFR 15-29 ml/min (HCC) 04/23/2020   Tobacco use 04/23/2020   Seizure disorder (HCC) 11/08/2018   Hypertension associated with diabetes (HCC) 11/08/2018   Type 2 diabetes with complication (HCC) 11/08/2018   Prostate cancer (HCC) 02/27/2014    Orientation RESPIRATION BLADDER Height & Weight     Self  Normal Continent, External catheter (External Urinary Catheter) Weight: 146 lb 2.6 oz (66.3 kg) Height:  6' (182.9 cm)  BEHAVIORAL SYMPTOMS/MOOD NEUROLOGICAL BOWEL NUTRITION STATUS      Continent Diet (Please see discharge summary)  AMBULATORY STATUS COMMUNICATION OF NEEDS Skin   Limited Assist Verbally Other (Comment) (WDL, Wound/Incision LDAs)                       Personal Care Assistance Level of Assistance  Bathing, Feeding, Dressing Bathing Assistance: Maximum assistance Feeding assistance: Limited assistance Dressing Assistance: Maximum assistance     Functional Limitations Info  Sight, Hearing, Speech Sight Info: Adequate Hearing Info: Adequate Speech Info: Impaired (difficulty speaking)    SPECIAL CARE FACTORS FREQUENCY  PT (By licensed PT), OT (By licensed OT)     PT Frequency: 5x min weekly OT Frequency: 5x min weekly            Contractures Contractures Info: Not present    Additional Factors Info  Code Status, Allergies Code Status Info: FULL Allergies Info: Penicillins  Current Medications (10/17/2023):  This is the current hospital active medication list Current Facility-Administered Medications  Medication Dose Route Frequency Provider Last Rate Last Admin   amLODipine  (NORVASC ) tablet 10 mg  10 mg Oral Daily Juberg, Christopher, DO   10 mg at 10/17/23 9056   aspirin  EC tablet 81 mg   81 mg Oral Daily Gomez-Caraballo, Maria, MD   81 mg at 10/17/23 9056   atorvastatin  (LIPITOR ) tablet 80 mg  80 mg Oral QPM Marylu Gee, DO   80 mg at 10/16/23 1746   hydrALAZINE  (APRESOLINE ) tablet 25 mg  25 mg Oral Q8H Gomez-Caraballo, Maria, MD   25 mg at 10/17/23 9055   Oral care mouth rinse  15 mL Mouth Rinse PRN Trudy Mliss Dragon, MD       pantoprazole  (PROTONIX ) EC tablet 40 mg  40 mg Oral BID Juberg, Christopher, DO   40 mg at 10/17/23 9056   thiamine  (VITAMIN B1) tablet 100 mg  100 mg Oral Daily Marylu Gee, DO   100 mg at 10/17/23 9056     Discharge Medications: Please see discharge summary for a list of discharge medications.  Relevant Imaging Results:  Relevant Lab Results:   Additional Information [id number]-[id number]  Isaiah Public, LCSWA

## 2023-10-17 NOTE — Telephone Encounter (Signed)
 Pharmacy Patient Advocate Encounter  Received notification from [facility] that Prior Authorization for Octreotide Acetate 10MG  kit  has been APPROVED from 10/17/2023 to 04/18/2024. Ran test claim, Copay is $0.00. This test claim was processed through [facility]- copay amounts may vary at other pharmacies due to pharmacy/plan contracts, or as the patient moves through the different stages of their insurance plan.   PA #/Case ID/Reference #: [id number]

## 2023-10-17 NOTE — TOC Progression Note (Addendum)
 Transition of Care [facility]) - Progression Note    Patient Details  Name: Gregory Nunez. MRN: [id number] Date of Birth: 07/16/1945  Transition of [facility]) CM/SW Contact  Gregory Nunez, LCSWA Phone Number: 10/17/2023, 11:31 AM  Clinical Narrative:      CSW spoke with patients nephew Gregory Nunez who confirmed that plan is for patient to return to [facility] for short term rehab. CSW spoke with patients insurance and they confirmed that patients insurance authorization currently pending. Patient has SNF bed at [facility]. CSW will continue to follow.  Update- CSW received call from patients insurance that  Patients insurance authorization has been approved with start date 6/30-7/2. Reference # [id number]. CSW awaiting to hear back from Gregory Nunez with [facility] to see if patient able to dc over today if medically stable.  Update- Gregory Nunez with [facility] informed CSW that patient able to dc over today if medically stable. CSW informed MD.  Update- MD informed CSW that the octreotide is not available until tomorrow. MD informed CSW that she spoke with nephew Gregory Nunez and he could come pick it up tomorrow and deliver to the SNF. CSW informed Gregory Nunez with [facility] who confirmed that is okay for nephew to pick up tomorrow and deliver to facility.  CSW also confirmed with patients nephew Gregory Nunez that he can come and pick up octreotide tomorrow and will deliver to SNF.CSW informed MD.    Expected Discharge Plan and Services                                               Social Determinants of Health (SDOH) Interventions SDOH Screenings   Food Insecurity: Patient Unable To Answer (10/14/2023)  Housing: Patient Unable To Answer (10/14/2023)  Transportation Needs: Patient Unable To Answer (10/14/2023)  Utilities: Patient Unable To Answer (10/14/2023)  Alcohol Screen: Low Risk  (09/21/2023)  Depression (PHQ2-9): Low Risk  (11/03/2022)  Financial Resource Strain: Low Risk  (09/21/2023)  Physical Activity: Insufficiently Active (09/21/2023)   Social Connections: Patient Unable To Answer (10/14/2023)  Recent Concern: Social Connections - Socially Isolated (09/21/2023)  Stress: No Stress Concern Present (09/21/2023)  Tobacco Use: Medium Risk (10/13/2023)  Health Literacy: Adequate Health Literacy (11/03/2022)    Readmission Risk Interventions    06/20/2023    2:55 PM 06/15/2023    3:00 PM  Readmission Risk Prevention Plan  Post Dischage Appt  Complete  Medication Screening  Complete  Transportation Screening Complete Complete  PCP or Specialist Appt within 5-7 Days Complete   Home Care Screening Complete   Medication Review (RN CM) Complete

## 2023-10-17 NOTE — TOC Transition Note (Addendum)
 Transition of Care [facility]) - Discharge Note   Patient Details  Name: Gregory Nunez. MRN: [id number] Date of Birth: 07/17/1945  Transition of Care [facility]) CM/SW Contact:  Isaiah Public, LCSWA Phone Number: 10/17/2023, 4:28 PM   Clinical Narrative:     Patient will DC to: [facility]  Anticipated DC date: 10/17/2023  Family notified: Ozell   Transport by [facility]  ?  Per MD patient ready for DC to [facility] . RN, patient, patient's family, and facility notified of DC. Discharge Summary sent to facility. RN given number for report [phone] RM# [phone]. DC packet on chart. Ambulance transport requested for patient.  CSW signing off.     Final next level of care: Skilled Nursing Facility Barriers to Discharge: No Barriers Identified   Patient Goals and CMS Choice     Choice offered to / list presented to :  (nephew Ozell)      Discharge Placement              Patient chooses bed at: [facility] Patient to be transferred to facility by: [facility] Name of family member notified: Leonel Ozell Patient and family notified of of transfer: 10/17/23  Discharge Plan and Services Additional resources added to the After Visit Summary for   In-house Referral: Clinical Social Work                                   Social Drivers of Health (SDOH) Interventions SDOH Screenings   Food Insecurity: Patient Unable To Answer (10/14/2023)  Housing: Patient Unable To Answer (10/14/2023)  Transportation Needs: Patient Unable To Answer (10/14/2023)  Utilities: Patient Unable To Answer (10/14/2023)  Alcohol Screen: Low Risk  (09/21/2023)  Depression (PHQ2-9): Low Risk  (11/03/2022)  Financial Resource Strain: Low Risk  (09/21/2023)  Physical Activity: Insufficiently Active (09/21/2023)  Social Connections: Patient Unable To Answer (10/14/2023)  Recent Concern: Social Connections - Socially Isolated (09/21/2023)  Stress: No Stress Concern Present (09/21/2023)  Tobacco Use: Medium Risk  (10/13/2023)  Health Literacy: Adequate Health Literacy (11/03/2022)     Readmission Risk Interventions    06/20/2023    2:55 PM 06/15/2023    3:00 PM  Readmission Risk Prevention Plan  Post Dischage Appt  Complete  Medication Screening  Complete  Transportation Screening Complete Complete  PCP or Specialist Appt within 5-7 Days Complete   Home Care Screening Complete   Medication Review (RN CM) Complete

## 2023-10-17 NOTE — Telephone Encounter (Signed)
 Pharmacy Patient Advocate Encounter   Received notification from Inpatient Request that prior authorization for Octreotide Acetate 10MG  kit is required/requested.   Insurance verification completed.   The patient is insured through [place] .   Per test claim: PA required; PA submitted to above mentioned insurance via CoverMyMeds Key/confirmation #/EOC [facility] Status is pending

## 2023-10-17 NOTE — Telephone Encounter (Signed)
 Patient Product/process development scientist completed.    The patient is insured through [facility]. Patient has Medicare and is not eligible for a copay card, but may be able to apply for patient assistance or Medicare RX Payment Plan (Patient Must reach out to their plan, if eligible for payment plan), if available.    Ran test claim for octreotide 10 mg injection and Requires Prior Authorization   This test claim was processed through [employer]- copay amounts may vary at other pharmacies due to [employer], or as the patient moves through the different stages of their insurance plan.     Reyes Sharps, CPHT Pharmacy Technician III Certified Patient Advocate [facility] Pharmacy Patient Advocate Team Direct Number: [phone]  Fax: [phone]

## 2023-10-17 NOTE — Progress Notes (Signed)
 .imts  HD#2 SUBJECTIVE:  Patient Summary: Gregory Bright. 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 He is admitted for UGIB s/p duodenal AVM treated with APC, with ASA restarted on 6/29, awaiting SNF authorization for discharge  Overnight Events and Interim History:  No acute events. Tolerated Regular diet without discomfort. Would like to go to SNF soon  Significant Hospital Events:  6/27 Enteroscopy with 6 nonbleeding AVMs in small bowel treated APC 6/28 regular diet 6/29 ASA 81  mg restarted  OBJECTIVE:  Vital Signs: Vitals:   10/16/23 1709 10/16/23 2021 10/17/23 0416 10/17/23 0825  BP: (!) 152/79 126/73 (!) 156/77 (!) 158/82  Pulse: 90 85 (!) 101 93  Resp:  16 16 20   Temp:  (!) 97.5 F (36.4 C) 98 F (36.7 C) 98.4 F (36.9 C)  TempSrc:  Oral Oral Oral  SpO2: 100% 100% 100% 100%  Weight:      Height:       Supplemental O2: Room Air SpO2: 100 %  Filed Weights   10/14/23 0946 10/14/23 1216  Weight: 66.3 kg 66.3 kg     Intake/Output Summary (Last 24 hours) at 10/17/2023 1353 Last data filed at 10/17/2023 0500 Gross per 24 hour  Intake --  Output 400 ml  Net -400 ml   Net IO Since Admission: -852.9 mL [10/17/23 1353]  Physical Exam: General: Pleasant, well-appearing man laying in bed. No acute distress. CV: RRR. No murmurs, rubs, or gallops. No LE edema Pulmonary: Lungs CTAB. Normal effort. No wheezing or rales. Abdominal: Soft, nontender, nondistended. Normal bowel sounds.  Skin: Warm and dry.  Neuro: A&Ox to self, baseline  Psych: Normal mood and affect   Patient Lines/Drains/Airways Status     Active Line/Drains/Airways     Name Placement date Placement time Site Days   Peripheral IV 10/14/23 22 G 1 Anterior;Right Forearm 10/14/23  1012  Forearm  1    External Urinary Catheter 10/14/23  0955  --  1            ASSESSMENT/PLAN:  Assessment: Principal Problem:   Acute on chronic blood loss anemia Active Problems:   Hypertension associated with diabetes (HCC)   Cerebrovascular accident (CVA) (HCC)   Stage 4 chronic kidney disease (HCC)   AVM (arteriovenous malformation) of small bowel, acquired   Protein-calorie malnutrition (HCC)   Antiplatelet or antithrombotic long-term use   Upper GI bleeding   Recent cerebrovascular accident (CVA)   Acute on chronic anemia   Plan: Acute on chronic anemia 2/2 UGI Bleed Hx of Duodenal AVM's Hx IDA/Anemia of Chronic Disease GI following, thank you. EGD sunday with 6 nonbleeding AVM treated with APC. No bleeding reported.  - Follow up Hgb today - PPI 40 twice daily - Re-start ASA 81 mg daily - Monitor Hgb in 6 days - Will need outpatient outpatient octreotide depot therapy once monthly to prevent AVM bleeding if covered by insurance - If re-bleeds, capsule endoscopy   CKD stage IV/V Improving BUN and Cr as of 6/28.  Would benefit of restarting ARB once BUN normalizes - Hgb monitoring as above - RFP monitoring; will follow up today   Hypertension On amlodipine  - Holding Hydralazine  and ARB   History of CVA (L infarct in the L thalamus and parietal white matter 09/2023) HLD Cognitive Impairment Cognitive  impairment which has been attributed to chronic vascular disease further c/b recent stroke.  He appears at baseline. - Resume home Lipitor  80 mg - Restarted ASA 81 mg daily   Protein calorie malnutrition Thiamine  deficiency - On home thiamine   Best Practice: Diet: Heart healthy VTE: SCDs Start: 10/14/23 0028 Code: Full Therapy Recs: SNF DISPO: Anticipated discharge TBD to Skilled nursing facility pending  Authorization from SNF  Signature: Sallyanne Primas, DO [facility] Program - PGY-1 1:53 PM, 10/17/2023

## 2023-10-17 NOTE — Plan of Care (Signed)

## 2023-10-18 ENCOUNTER — Other Ambulatory Visit (HOSPITAL_COMMUNITY): Payer: Self-pay

## 2023-10-18 DIAGNOSIS — D500 Iron deficiency anemia secondary to blood loss (chronic): Principal | ICD-10-CM

## 2023-11-01 ENCOUNTER — Other Ambulatory Visit: Payer: Self-pay

## 2023-11-01 NOTE — Telephone Encounter (Signed)
 ERROR

## 2023-11-18 ENCOUNTER — Other Ambulatory Visit (HOSPITAL_COMMUNITY): Payer: Self-pay

## 2023-11-21 ENCOUNTER — Other Ambulatory Visit (HOSPITAL_COMMUNITY): Payer: Self-pay

## 2023-11-22 ENCOUNTER — Other Ambulatory Visit: Payer: Self-pay

## 2023-11-22 ENCOUNTER — Encounter (HOSPITAL_COMMUNITY): Payer: Self-pay

## 2023-11-22 ENCOUNTER — Other Ambulatory Visit (HOSPITAL_COMMUNITY): Payer: Self-pay

## 2023-11-22 NOTE — Progress Notes (Signed)
 Encounter created in error. Please disregard.

## 2023-11-23 ENCOUNTER — Encounter (HOSPITAL_COMMUNITY): Payer: Self-pay | Admitting: Emergency Medicine

## 2023-11-23 ENCOUNTER — Observation Stay (HOSPITAL_COMMUNITY)
Admission: EM | Admit: 2023-11-23 | Discharge: 2023-11-28 | Disposition: A | Discharge status: Home / Self Care | Attending: Emergency Medicine | Admitting: Emergency Medicine

## 2023-11-23 ENCOUNTER — Other Ambulatory Visit: Payer: Self-pay

## 2023-11-23 DIAGNOSIS — R2681 Unsteadiness on feet: Secondary | ICD-10-CM | POA: Insufficient documentation

## 2023-11-23 DIAGNOSIS — B182 Chronic viral hepatitis C: Secondary | ICD-10-CM | POA: Diagnosis not present

## 2023-11-23 DIAGNOSIS — D509 Iron deficiency anemia, unspecified: Secondary | ICD-10-CM | POA: Diagnosis not present

## 2023-11-23 DIAGNOSIS — Q2733 Arteriovenous malformation of digestive system vessel: Secondary | ICD-10-CM | POA: Diagnosis not present

## 2023-11-23 DIAGNOSIS — Z87891 Personal history of nicotine dependence: Secondary | ICD-10-CM | POA: Insufficient documentation

## 2023-11-23 DIAGNOSIS — I120 Hypertensive chronic kidney disease with stage 5 chronic kidney disease or end stage renal disease: Secondary | ICD-10-CM | POA: Insufficient documentation

## 2023-11-23 DIAGNOSIS — K922 Gastrointestinal hemorrhage, unspecified: Principal | ICD-10-CM

## 2023-11-23 DIAGNOSIS — R7401 Elevation of levels of liver transaminase levels: Secondary | ICD-10-CM | POA: Diagnosis not present

## 2023-11-23 DIAGNOSIS — R471 Dysarthria and anarthria: Secondary | ICD-10-CM | POA: Diagnosis not present

## 2023-11-23 DIAGNOSIS — B1910 Unspecified viral hepatitis B without hepatic coma: Secondary | ICD-10-CM | POA: Insufficient documentation

## 2023-11-23 DIAGNOSIS — Z8673 Personal history of transient ischemic attack (TIA), and cerebral infarction without residual deficits: Secondary | ICD-10-CM | POA: Diagnosis not present

## 2023-11-23 DIAGNOSIS — N185 Chronic kidney disease, stage 5: Secondary | ICD-10-CM | POA: Diagnosis not present

## 2023-11-23 DIAGNOSIS — D696 Thrombocytopenia, unspecified: Secondary | ICD-10-CM | POA: Diagnosis not present

## 2023-11-23 DIAGNOSIS — D72819 Decreased white blood cell count, unspecified: Secondary | ICD-10-CM | POA: Insufficient documentation

## 2023-11-23 DIAGNOSIS — F0150 Vascular dementia without behavioral disturbance: Secondary | ICD-10-CM | POA: Diagnosis not present

## 2023-11-23 DIAGNOSIS — Z7982 Long term (current) use of aspirin: Secondary | ICD-10-CM | POA: Diagnosis not present

## 2023-11-23 DIAGNOSIS — M6281 Muscle weakness (generalized): Secondary | ICD-10-CM | POA: Diagnosis not present

## 2023-11-23 DIAGNOSIS — E1122 Type 2 diabetes mellitus with diabetic chronic kidney disease: Secondary | ICD-10-CM | POA: Insufficient documentation

## 2023-11-23 LAB — CBC
HCT: 21.1 % — ABNORMAL LOW (ref 39.0–52.0)
Hemoglobin: 6.7 g/dL — CL (ref 13.0–17.0)
MCH: 30.9 pg (ref 26.0–34.0)
MCHC: 31.8 g/dL (ref 30.0–36.0)
MCV: 97.2 fL (ref 80.0–100.0)
Platelets: 120 K/uL — ABNORMAL LOW (ref 150–400)
RBC: 2.17 MIL/uL — ABNORMAL LOW (ref 4.22–5.81)
RDW: 15.1 % (ref 11.5–15.5)
WBC: 3.1 K/uL — ABNORMAL LOW (ref 4.0–10.5)
nRBC: 0 % (ref 0.0–0.2)

## 2023-11-23 LAB — COMPREHENSIVE METABOLIC PANEL WITH GFR
ALT: 56 U/L — ABNORMAL HIGH (ref 0–44)
AST: 59 U/L — ABNORMAL HIGH (ref 15–41)
Albumin: 3.1 g/dL — ABNORMAL LOW (ref 3.5–5.0)
Alkaline Phosphatase: 56 U/L (ref 38–126)
Anion gap: 11 (ref 5–15)
BUN: 73 mg/dL — ABNORMAL HIGH (ref 8–23)
CO2: 21 mmol/L — ABNORMAL LOW (ref 22–32)
Calcium: 8.3 mg/dL — ABNORMAL LOW (ref 8.9–10.3)
Chloride: 107 mmol/L (ref 98–111)
Creatinine, Ser: 4.57 mg/dL — ABNORMAL HIGH (ref 0.61–1.24)
GFR, Estimated: 13 mL/min — ABNORMAL LOW (ref >60)
Glucose, Bld: 187 mg/dL — ABNORMAL HIGH (ref 70–99)
Potassium: 4.8 mmol/L (ref 3.5–5.1)
Sodium: 139 mmol/L (ref 135–145)
Total Bilirubin: 0.5 mg/dL (ref 0.0–1.2)
Total Protein: 7.6 g/dL (ref 6.5–8.1)

## 2023-11-23 LAB — PREPARE RBC (CROSSMATCH)

## 2023-11-23 MED ORDER — SODIUM CHLORIDE 0.9% IV SOLUTION: Freq: Once | INTRAVENOUS | Status: DC

## 2023-11-23 MED ORDER — ATORVASTATIN CALCIUM 80 MG PO TABS
80.0000 mg | ORAL_TABLET | Freq: Every evening | ORAL | Status: DC
  Administered 2023-11-24 – 2023-11-27 (×4): 80 mg via ORAL
  Filled 2023-11-23 (×4): qty 1

## 2023-11-23 MED ORDER — HEPARIN SODIUM (PORCINE) 5000 UNIT/ML IJ SOLN
5000.0000 [IU] | Freq: Three times a day (TID) | INTRAMUSCULAR | Status: DC
  Filled 2023-11-23: qty 1

## 2023-11-23 MED ORDER — PANTOPRAZOLE SODIUM 40 MG IV SOLR
40.0000 mg | Freq: Two times a day (BID) | INTRAVENOUS | Status: DC
  Filled 2023-11-23: qty 10

## 2023-11-23 MED ORDER — AMLODIPINE BESYLATE 10 MG PO TABS
10.0000 mg | ORAL_TABLET | Freq: Every day | ORAL | Status: DC
  Administered 2023-11-24 – 2023-11-28 (×5): 10 mg via ORAL
  Filled 2023-11-23 (×5): qty 1

## 2023-11-23 MED ORDER — PANTOPRAZOLE SODIUM 40 MG IV SOLR
40.0000 mg | Freq: Once | INTRAVENOUS | Status: AC
  Administered 2023-11-23: 40 mg via INTRAVENOUS
  Filled 2023-11-23: qty 10

## 2023-11-23 MED ORDER — HYDRALAZINE HCL 25 MG PO TABS
25.0000 mg | ORAL_TABLET | Freq: Three times a day (TID) | ORAL | Status: DC
Start: 2023-11-24 — End: 2023-11-28
  Administered 2023-11-24 – 2023-11-28 (×16): 25 mg via ORAL
  Filled 2023-11-23 (×14): qty 1

## 2023-11-23 NOTE — Hospital Course (Addendum)
 Gregory Nunez. is a 78 y.o. 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   #Chronic duodenal AVM #Acute on Chronic Anemia d/t GI bleeding #Anemia of Chronic Disease Patient without subjective complaints but questionable whether there were dark stools at SNF. ED physician notes dark stool on patient's brief during his exam.  Vital signs stable. No pertinent exam findings. Previous push enteroscopy on 10/14/23 showing multiple AVMs in duodenum that were treated with argon plasma coagulation. Patient's hemoglobin improved to 8.4 from 6.7 after 1 unit of packed red blood cells in the emergency department. Trended H&H's while inpatient, hemoglobin stable at 8.4. Normocytic anemia is likely due to known duodenal AVMs, however, the patient has CKD5 which may be a contributing factor. He received IV Protonix  40 mg in the ED. The patient is stable and has GI appointment scheduled for 12/09/2023 with [facility] from prior admission. Recommendations from prior hospitalization for GI bleed were outpatient follow-up, capsule endoscopy if still bleeding.  Continued PPI twice daily, octreotide  Depo q 28 days, and took caution with iron  pills. Per dispense history, Octreotide  was dispensed by pharmacy but SNF is unable to confirm whether this was administered or not.  Consulted GI for recommendations on whether the patient would benefit from inpatient capsule endoscopy. GI evaluated and proceeded with capsule endoscopy after half of colonoscopy prep. He may not be a candidate for bevacizumab  if he has persistent bleeding. Per nursing staff, the patient refused his bowel prep for capsule endoscopy but was able to swallow capsule.  Unsure of what the quality of  exam will be. I do not think we will be able to have a risk/benefit discussion about resuming aspirin  with the patient but I discussed this with one of his family members today.  Aspirin  was resumed prior to discharge with the caveat of if there is any additional bleeding this medication should be stopped and not restarted due to the risks of rehospitalization for GI bleed outweighing the risks of microvascular strokes.   #Mild Transaminitis #Chronic HCV AST of 59 and ALT of 56 are slightly elevated from baseline on admission. Previously saw Dr. Eben on 11/23/22 for chronic HCV and treated with Mavyret  of which he was on treatment from August 2024-February 2025.  FIB4 score is 4.99 so he will likely need GI follow up for elastogram. Per chart review, he was supposed to get an elastogram in August 2024 but this was not completed. Plan to resume Mavyret  at discharge with plans to treat for 8 weeks with follow-up confirmatory testing.  Hepatitis panel was rechecked, still has active hepatitis C infection.  He also shows that he has no immune response to hepatitis B, will need an updated hepatitis B vaccine outpatient.   #History of Ischemic CVA with residual dysarthria #Vascular dementia Patient had an ischemic stroke on 09/21/23 with infarcts in the left thalamus and parietal white matter. He appeared to be at baseline mental status. Given the acute concerns for GI bleed with hemoglobin < 7, will held aspirin .  Continued home atorvastatin  80 mg while inpatient.  Nursing staff reported patient has been agitated, especially at night.  He has also reported having difficulty sleeping.  As needed melatonin 3mg  was ordered.  #Hypertension Blood pressure on admission  is 151/85.  Blood pressures have been normotensive to slightly hypertensive while here. Continued home amlodipine  10mg  daily and hydralazine  25mg  daily.   #T2DM Last A1c of 5.1% on 09/22/23. Glucose elevated while inpatient, but no higher than 190.  Not currently taking any anti-hyperglycemics as disease is well-controlled.  Will need to follow-up PCP outpatient.    #Leukopenia #Thrombocytopenia WBC down to 3.1 and platelets of 120 on initial CBC. These CBC abnormalities appear to be chronic and stable issues. We will continue to follow while the patient requires inpatient care. He was placed on heparin  for VTE and we monitored for signs of HIT.  Patient refused heparin  injection, has not received any while inpatient.    #CKD5 Cr is around 4.57, around baseline. Appears stable. Will require heparin  for VTE due to renal dysfunction.  Patient refused heparin  multiple times.

## 2023-11-23 NOTE — H&P (Signed)
 Date: 11/24/2023               Patient Name:  Gregory Nunez. MRN: [id number]  DOB: 06/14/1945 Age / Sex: 78 y.o., male   PCP: Charmayne Holmes, DO         Medical Service: Internal Medicine Teaching Service         Attending Physician: Dr. MICAEL Riis Winfrey      First Contact: Gregory Penning, DO    Second Contact: Dr. Fairy Pool, DO          Pager Information: First Contact Pager: [phone]   Second Contact Pager: [phone]   SUBJECTIVE   Chief Complaint: GI Bleeding  History of Present Illness - limited due to patient's dementia Gregory Nunez. is a 78 y.o. 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 Subjectively, the patient feels well and denies any fatigue, dizziness, hematochezia, melena, chest pain, shortness of breath, diarrhea. Denies recent NSAID use.  Called SNF for further information and was told the patient was sent to the ED for critically low hemoglobin of 6.3. No other subjective information was provided.  Of note, the patient was admitted to IMTS on 10/14/2023 for GI bleed as well. Small bowel enteroscopy on 10/14/23: showing angiodysplastic lesions in the duodenum treated with argon plasma coagulation. He was transfused and discharged with plan of GI follow up if Hgb continued to downtrend. He has an appointment with [facility] on 12/09/23.  ED Course:  Labs significant for Hgb of 6.7.  Imaging: None Received 1 unit pRBCs, 40mg  IV protonix  Consults: None  Meds - patient unable to confirm current medications. Called SNF and now waiting for them to fax over [facility]. Meds from last discharge summary: Amlodipine  10 mg daily Aspirin  81 mg daily Atorvastatin  80 mg daily Hydralazine  25 mg 3 times daily Octreotide  20 mg  injection q. 28 days Pantoprazole  40 mg twice daily Thiamine  100 mg daily  Allergies  Allergies as of 11/23/2023 - Review Complete 11/23/2023  Allergen Reaction Noted   Penicillins Other (See Comments) 05/16/2011   Past Medical History 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)    Epilepsy (HCC)    GERD (gastroesophageal reflux disease)    if drinks alcohol   GI bleed 10/05/2020   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   Patient is approximately 5-week status post bunion correction left foot   Hyperlipidemia    Hypertension    Hypertensive retinopathy    OU   Hypoglycemia 10/22/2020   Malignant neoplasm of prostate (HCC) 01/09/2014   Neuropathy    Pancreatitis    Pneumococcal vaccination administered at current visit 11/10/2020  Prostate cancer (HCC) 12/19/2013   Gleason 4+3=7, volume 31.31 cc   Rhabdomyolysis 04/12/2021   Sciatica    Shortness of breath dyspnea    with exertion    Viral hepatitis C     Past Surgical History Past Surgical History:  Procedure Laterality Date   BIOPSY  04/16/2018   Procedure: BIOPSY;  Surgeon: Kristie Lamprey, MD;  Location: [facility] ENDOSCOPY;  Service: Endoscopy;;   BIOPSY  05/23/2020   Procedure: BIOPSY;  Surgeon: Charlanne Groom, MD;  Location: [facility] ENDOSCOPY;  Service: Gastroenterology;;  EGD and COLON   BIOPSY  12/09/2020    Procedure: BIOPSY;  Surgeon: Eda Iha, MD;  Location: [facility] ENDOSCOPY;  Service: Gastroenterology;;   biopsy on throat     hx of    CATARACT EXTRACTION Bilateral    Dr. Medford Ferrier   COLONOSCOPY N/A 05/23/2020   Procedure: COLONOSCOPY;  Surgeon: Charlanne Groom, MD;  Location: [facility] ENDOSCOPY;  Service: Gastroenterology;  Laterality: N/A;   ENTEROSCOPY N/A 10/07/2020   Procedure: ENTEROSCOPY;  Surgeon: Avram Lupita BRAVO, MD;  Location: [facility] ENDOSCOPY;  Service: Endoscopy;  Laterality: N/A;   ENTEROSCOPY N/A 10/03/2023   Procedure: ENTEROSCOPY;  Surgeon: San Sandor GAILS, DO;  Location: [facility] ENDOSCOPY;  Service: Gastroenterology;  Laterality: N/A;   ENTEROSCOPY N/A 10/14/2023   Procedure: ENTEROSCOPY;  Surgeon: Leigh Elspeth SQUIBB, MD;  Location: [facility] ENDOSCOPY;  Service: Gastroenterology;  Laterality: N/A;   ESOPHAGOGASTRODUODENOSCOPY Left 04/16/2018   Procedure: ESOPHAGOGASTRODUODENOSCOPY (EGD);  Surgeon: Kristie Lamprey, MD;  Location: [facility];  Service: Endoscopy;  Laterality: Left;   ESOPHAGOGASTRODUODENOSCOPY (EGD) WITH PROPOFOL  N/A 05/23/2020   Procedure: ESOPHAGOGASTRODUODENOSCOPY (EGD) WITH PROPOFOL ;  Surgeon: Charlanne Groom, MD;  Location: [facility] ENDOSCOPY;  Service: Gastroenterology;  Laterality: N/A;   ESOPHAGOGASTRODUODENOSCOPY (EGD) WITH PROPOFOL  N/A 12/09/2020   Procedure: ESOPHAGOGASTRODUODENOSCOPY (EGD) WITH PROPOFOL ;  Surgeon: Eda Iha, MD;  Location: [facility] ENDOSCOPY;  Service: Gastroenterology;  Laterality: N/A;   EYE SURGERY     FOOT SURGERY     HOT HEMOSTASIS N/A 04/16/2018   Procedure: HOT HEMOSTASIS (ARGON PLASMA COAGULATION/BICAP);  Surgeon: Kristie Lamprey, MD;  Location: [facility];  Service: Endoscopy;  Laterality: N/A;   HOT HEMOSTASIS N/A 05/23/2020   Procedure: HOT HEMOSTASIS (ARGON PLASMA COAGULATION/BICAP);  Surgeon: Charlanne Groom, MD;  Location: [facility];  Service: Gastroenterology;  Laterality: N/A;   HOT HEMOSTASIS N/A 12/09/2020   Procedure: HOT HEMOSTASIS (ARGON  PLASMA COAGULATION/BICAP);  Surgeon: Eda Iha, MD;  Location: [facility] ENDOSCOPY;  Service: Gastroenterology;  Laterality: N/A;   HOT HEMOSTASIS N/A 10/03/2023   Procedure: EGD, WITH ARGON PLASMA COAGULATION;  Surgeon: San Sandor GAILS, DO;  Location: [facility] ENDOSCOPY;  Service: Gastroenterology;  Laterality: N/A;   HOT HEMOSTASIS N/A 10/14/2023   Procedure: EGD, WITH ARGON PLASMA COAGULATION;  Surgeon: Leigh Elspeth SQUIBB, MD;  Location: [facility] ENDOSCOPY;  Service: Gastroenterology;  Laterality: N/A;   LYMPHADENECTOMY Bilateral 02/27/2014   Procedure: BILATERAL LYMPHADENECTOMY;  Surgeon: Ricardo Likens, MD;  Location: [facility] ORS;  Service: Urology;  Laterality: Bilateral;   POLYPECTOMY  05/23/2020   Procedure: POLYPECTOMY;  Surgeon: Charlanne Groom, MD;  Location: [facility] ENDOSCOPY;  Service: Gastroenterology;;   PROSTATE BIOPSY  12/2013   Gleason 4+3=7, volume 31.31 cc   ROBOT ASSISTED LAPAROSCOPIC RADICAL PROSTATECTOMY N/A 02/27/2014   Procedure: ROBOTIC ASSISTED LAPAROSCOPIC RADICAL PROSTATECTOMY WITH INDOCYANINE GREEN DYE;  Surgeon: Ricardo Likens, MD;  Location: [facility] ORS;  Service: Urology;  Laterality: N/A;   Social History  Living Situation: [employer] Support: Nephew - Gregory Nunez Level of Function: Lives at  SNF: uses rolling walker for ambulation; Needs assistance with IADLs PCP: Charmayne Holmes, DO  Substances: -Tobacco: Former smoker -Alcohol: Former use; denies currently -Recreational Drug: Denies  Family History  Family History  Problem Relation Age of Onset   Heart disease Mother    Heart attack Father 30   Cancer Sister        breast   Colon cancer Neg Hx    Esophageal cancer Neg Hx    Rectal cancer Neg Hx    Stomach cancer Neg Hx      Review of Systems  A complete ROS was negative except as per HPI.   OBJECTIVE:   Physical Exam: Blood pressure (!) 151/85, pulse 80, temperature 97.7 F (36.5 C), temperature source Oral, resp. rate 16, height 6' (1.829 m),  weight 66 kg, SpO2 100%.  Constitutional: well-appearing, well-nourished, in no acute distress - lying comfortably in bed in ED hallway HENT: normocephalic atraumatic, mucous membranes moist Eyes: conjunctiva non-erythematous, PERRL, no scleral icterus Cardiovascular: regular rate and rhythm, no m/r/g Pulmonary/Chest: normal work of breathing on room air Abdominal: soft, non-tender, non-distended, bowel sounds normal MSK: normal bulk and tone Neurological: alert & oriented to name, place, situation but not to month, year, or DOB. Dysarthric with expressive aphasia. Skin: warm and dry Extremities: no edema or cyanosis; peripheral pulses intact Psych: normal mood and affect  Labs: CBC    Component Value Date/Time   WBC 3.1 (L) 11/23/2023 2115   RBC 2.17 (L) 11/23/2023 2115   HGB 6.7 (LL) 11/23/2023 2115   HGB 10.6 (L) 10/14/2022 1016   HCT 21.1 (L) 11/23/2023 2115   HCT 32.2 (L) 10/14/2022 1016   PLT 120 (L) 11/23/2023 2115   PLT 166 10/14/2022 1016   MCV 97.2 11/23/2023 2115   MCV 93 10/14/2022 1016   MCH 30.9 11/23/2023 2115   MCHC 31.8 11/23/2023 2115   RDW 15.1 11/23/2023 2115   RDW 11.9 10/14/2022 1016   LYMPHSABS 0.9 10/02/2023 0356   MONOABS 0.6 10/02/2023 0356   EOSABS 0.2 10/02/2023 0356   BASOSABS 0.0 10/02/2023 0356     CMP     Component Value Date/Time   NA 139 11/23/2023 2115   NA 140 10/14/2022 1016   K 4.8 11/23/2023 2115   CL 107 11/23/2023 2115   CO2 21 (L) 11/23/2023 2115   GLUCOSE 187 (H) 11/23/2023 2115   BUN 73 (H) 11/23/2023 2115   BUN 39 (H) 10/14/2022 1016   CREATININE 4.57 (H) 11/23/2023 2115   CREATININE 1.52 (H) 08/28/2020 1427   CALCIUM  8.3 (L) 11/23/2023 2115   PROT 7.6 11/23/2023 2115   PROT 7.4 10/14/2022 1016   ALBUMIN 3.1 (L) 11/23/2023 2115   ALBUMIN 4.0 10/14/2022 1016   AST 59 (H) 11/23/2023 2115   ALT 56 (H) 11/23/2023 2115   ALKPHOS 56 11/23/2023 2115   BILITOT 0.5 11/23/2023 2115   BILITOT <0.2 10/14/2022 1016   GFRNONAA  13 (L) 11/23/2023 2115   GFRNONAA 45 (L) 08/28/2020 1427   GFRAA 52 (L) 08/28/2020 1427    Imaging: none  EKG: none obtained  ASSESSMENT & PLAN:   Assessment & Plan by Problem: Principal Problem:   GI bleeding   Gregory Nunez. 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  #Chronic duodenal AVM #Acute on Chronic Anemia d/t GI bleeding #Anemia of Chronic Disease Patient without subjective complaints but questionable whether there were dark stools at SNF. ED physician notes dark stool on patient's brief during his exam.  Vital signs stable. No pertinent exam findings. Previous push enteroscopy on 10/14/23 showing multiple AVMs in duodenum that were treated with argon plasma coagulation. Hemoglobin of 6.7. Normocytic anemia is likely due to known duodenal AVMs, however, the patient has CKD5 which may be a contributing factor. He received 1 unit of blood and PPI in the ED. The patient is stable and has GI appointment scheduled for 12/09/2023 from prior admission. Recommendations from prior hospitalization for GI bleed were outpatient follow-up, capsule endoscopy if still bleeding, PPI twice daily, octreotide  Depo shot every 28 days, and caution with iron  pills. Per dispense history, Octreotide  was dispensed by pharmacy but SNF is unable to confirm whether this was administered or not. We are awaiting MAR to be faxed over for more information. Appropriate to wait for morning to consult GI for recommendations on whether the patient would benefit from further interventions at this time. -Trend H&H -s/p 1 pRBCs in ED -Transfuse if Hgb < 7 -IV Protonix  40 mg twice daily  #History of Ischemic CVA with residual dysarthria #Vascular dementia Patient had an ischemic  stroke on 09/21/23 with infarcts in the left thalamus and parietal white matter. He appears to be at baseline mental status. Given the acute concerns for GI bleed with hemoglobin < 7, will hold aspirin . Octreotide  shots were started due to increased bleeding risk from aspirin  and Plavix . -Continue atorvastatin  80mg  daily -Holding home ASA 81mg   #Hypertension -Continue home amlodipine  10mg  daily -Continue hydralazine  25mg  daily  #T2DM Last A1c of 5.1% on 09/22/23. Glucose elevated to 187. Not currently taking any anti-hyperglycemics as disease is well-controlled. -Follow up with PCP outpatient  #Leukopenia #Thrombocytopenia WBC down to 3.1 and platelets of 120 on initial CBC. These CBC abnormalities appear to be chronic and stable issues. We will continue to follow while the patient requires inpatient care. He is on heparin  for VTE and we will monitor for signs of HIT. -Continue to monitor  #CKD5 GFR down to 13, Cr is around baseline. Appears stable. Will require heparin  for VTE due to renal dysfunction.  -Continue to monitor  #Mild Transaminitis #Chronic HCV AST of 59 and ALT of 56 are slightly elevated from baseline. Previously saw Dr. Eben on 11/23/22 for chronic HCV and treated with Mavyret  which was stopped at his previous hospitalization in February. FIB4 score is 4.99 so he will likely need GI follow up for elastogram. Per chart review, he was supposed to get an elastogram in August 2024 but this was not completed. We will check hepatitis panel to monitor for current infection. -Hepatitis panel pending  Best practice: Diet: Normal VTE: Heparin  IVF: NS via Guardrails at 10mL/hr Code: Full  Disposition planning: Prior to Admission Living Arrangement: SNF Anticipated Discharge Location: SNF  Dispo: Admit patient to Observation with expected length of stay less than 2 midnights.  Signed: Letha Cheadle, MD [facility]  PGY-1 11/24/2023, 12:39 AM  Please contact IM Residency  On-Call Pager at: [phone] or [phone].

## 2023-11-23 NOTE — ED Provider Notes (Signed)
 [place] EMERGENCY DEPARTMENT AT [facility] Provider Note  CSN: [id number] Arrival date & time: 11/23/23 2056  Chief Complaint(s) Abnormal Lab  HPI Gregory Nunez. is a 78 y.o. male who was sent from [facility] health care center due to low hemoglobin.  Patient with a history of CVA, dementia, upper GI bleeding, recent admission for anemia.  Patient denies any complaints at this time.   Past Medical History 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)    Epilepsy (HCC)    GERD (gastroesophageal reflux disease)    if drinks alcohol   GI bleed 10/05/2020   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   Patient is approximately 5-week status post bunion correction left foot   Hyperlipidemia    Hypertension    Hypertensive retinopathy    OU   Hypoglycemia 10/22/2020   Malignant neoplasm of prostate (HCC) 01/09/2014   Neuropathy    Pancreatitis    Pneumococcal vaccination administered at current visit 11/10/2020   Prostate cancer (HCC) 12/19/2013   Gleason 4+3=7, volume 31.31 cc   Rhabdomyolysis 04/12/2021   Sciatica    Shortness of breath dyspnea    with exertion    Viral hepatitis C    Patient Active Problem List   Diagnosis Date Noted   Blood loss anemia 10/18/2023   Acute on chronic anemia 10/15/2023   Protein-calorie malnutrition (HCC)  10/14/2023   Thiamine  deficiency 10/14/2023   Antiplatelet or antithrombotic long-term use 10/14/2023   Upper GI bleeding 10/14/2023   Recent cerebrovascular accident (CVA) 10/14/2023   Gastritis and gastroduodenitis 10/03/2023   Duodenal erosion 10/03/2023   AVM (arteriovenous malformation) of small bowel, 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   GI bleed 10/01/2023   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   Cerebrovascular accident (CVA) (HCC) 09/22/2023   Encephalopathy 09/22/2023   Aphasia 09/22/2023   Neutropenia (HCC) 09/22/2023   Stage 4 chronic kidney disease (HCC) 09/22/2023   Essential hypertension 09/22/2023   Confusion with non-focal neuro exam 08/17/2023   Acute on chronic blood loss anemia 06/13/2023   Chronic active hepatitis C (HCC) 11/03/2022   History of epistaxis 12/04/2021   Debility 04/15/2021   History of alcohol abuse 04/13/2021   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/12/2021   Weakness of both legs    Adenomatous duodenal polyp    AVM (arteriovenous malformation) of duodenum, acquired with hemorrhage    Angiodysplasia of intestine    Iron  deficiency anemia 05/22/2020   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 (HCC)    History of CVA (cerebrovascular accident) 04/23/2020   Hyperlipidemia associated with type 2 diabetes mellitus (HCC) 04/23/2020   CKD (chronic kidney disease) stage 4, GFR 15-29 ml/min (HCC) 04/23/2020   Tobacco use 04/23/2020   Seizure disorder (HCC) 11/08/2018   Hypertension associated with diabetes (HCC) 11/08/2018   Type 2 diabetes with complication (HCC)  11/08/2018   Prostate cancer (HCC) 02/27/2014   Home Medication(s) Prior to Admission medications   Medication Sig Start Date End Date Taking? Authorizing Provider  amLODipine  (NORVASC ) 10 MG tablet Take 1 tablet (10 mg total) by mouth daily. 06/20/23   Jadine Toribio SQUIBB, MD  aspirin  EC 81 MG tablet Take 1 tablet (81 mg total) by mouth daily. Swallow whole. 09/25/23    Tawkaliyar, Roya, DO  atorvastatin  (LIPITOR ) 80 MG tablet Take 1 tablet (80 mg total) by mouth daily. Patient taking differently: Take 80 mg by mouth every evening. 09/25/23   Tawkaliyar, Roya, DO  hydrALAZINE  (APRESOLINE ) 25 MG tablet Take 1 tablet (25 mg total) by mouth every 8 (eight) hours. 10/14/22   Amponsah, Prosper M, MD  multivitamin (PROSIGHT) TABS tablet Take 1 tablet by mouth daily. 09/25/23   Tawkaliyar, Roya, DO  octreotide  (SANDOSTATIN  LAR) 20 MG injection Inject 20 mg into the muscle every 28 (twenty-eight) days. 10/17/23   Elnora Ip, MD  pantoprazole  (PROTONIX ) 40 MG tablet Take 1 tablet (40 mg total) by mouth 2 (two) times daily. 10/17/23   Elnora Ip, MD  thiamine  (VITAMIN B-1) 100 MG tablet Take 1 tablet (100 mg total) by mouth daily. 10/17/23   Elnora Ip, MD  COMBIVENT RESPIMAT 20-100 MCG/ACT AERS respimat Inhale 1 puff into the lungs every 6 (six) hours as needed for shortness of breath. 06/12/18 08/19/19  [provider]  mometasone -formoterol  (DULERA) 100-5 MCG/ACT AERO Inhale 2 puffs into the lungs daily. Patient not taking: Reported on 08/19/2019 04/17/18 08/19/19  Rosario Eland I, MD  sucralfate  (CARAFATE ) 1 g tablet Take 1 tablet (1 g total) by mouth 4 (four) times daily. Patient not taking: Reported on 08/19/2019 04/17/18 08/19/19  Rosario Eland I, MD  tiotropium (SPIRIVA  HANDIHALER) 18 MCG inhalation capsule Place 1 capsule (18 mcg total) into inhaler and inhale daily. Patient not taking: Reported on 08/19/2019 04/17/18 08/19/19  Rosario Eland I, MD  traZODone  (DESYREL ) 50 MG tablet Take 50 mg by mouth at bedtime as needed for sleep. Patient not taking: Reported on 10/06/2020  10/08/20  [provider]                                                                                                                                    Past Surgical History Past Surgical History:  Procedure Laterality Date   BIOPSY  04/16/2018    Procedure: BIOPSY;  Surgeon: Kristie Lamprey, MD;  Location: [facility] ENDOSCOPY;  Service: Endoscopy;;   BIOPSY  05/23/2020   Procedure: BIOPSY;  Surgeon: Charlanne Groom, MD;  Location: [facility] ENDOSCOPY;  Service: Gastroenterology;;  EGD and COLON   BIOPSY  12/09/2020   Procedure: BIOPSY;  Surgeon: Eda Iha, MD;  Location: [facility] ENDOSCOPY;  Service: Gastroenterology;;   biopsy on throat     hx of    CATARACT EXTRACTION Bilateral    Dr. Medford Ferrier   COLONOSCOPY N/A 05/23/2020  Procedure: COLONOSCOPY;  Surgeon: Charlanne Groom, MD;  Location: [facility];  Service: Gastroenterology;  Laterality: N/A;   ENTEROSCOPY N/A 10/07/2020   Procedure: ENTEROSCOPY;  Surgeon: Avram Lupita BRAVO, MD;  Location: [facility] ENDOSCOPY;  Service: Endoscopy;  Laterality: N/A;   ENTEROSCOPY N/A 10/03/2023   Procedure: ENTEROSCOPY;  Surgeon: San Sandor GAILS, DO;  Location: [facility] ENDOSCOPY;  Service: Gastroenterology;  Laterality: N/A;   ENTEROSCOPY N/A 10/14/2023   Procedure: ENTEROSCOPY;  Surgeon: Leigh Elspeth SQUIBB, MD;  Location: [facility] ENDOSCOPY;  Service: Gastroenterology;  Laterality: N/A;   ESOPHAGOGASTRODUODENOSCOPY Left 04/16/2018   Procedure: ESOPHAGOGASTRODUODENOSCOPY (EGD);  Surgeon: Kristie Lamprey, MD;  Location: [facility];  Service: Endoscopy;  Laterality: Left;   ESOPHAGOGASTRODUODENOSCOPY (EGD) WITH PROPOFOL  N/A 05/23/2020   Procedure: ESOPHAGOGASTRODUODENOSCOPY (EGD) WITH PROPOFOL ;  Surgeon: Charlanne Groom, MD;  Location: [facility]OS[facility];  Service: Gastroenterology;  Laterality: N/A;   ESOPHAGOGASTRODUODENOSCOPY (EGD) WITH PROPOFOL  N/A 12/09/2020   Procedure: ESOPHAGOGASTRODUODENOSCOPY (EGD) WITH PROPOFOL ;  Surgeon: Eda Iha, MD;  Location: [facility] ENDOSCOPY;  Service: Gastroenterology;  Laterality: N/A;   EYE SURGERY     FOOT SURGERY     HOT HEMOSTASIS N/A 04/16/2018   Procedure: HOT HEMOSTASIS (ARGON PLASMA COAGULATION/BICAP);  Surgeon: Kristie Lamprey, MD;  Location: [facility];  Service: Endoscopy;  Laterality: N/A;    HOT HEMOSTASIS N/A 05/23/2020   Procedure: HOT HEMOSTASIS (ARGON PLASMA COAGULATION/BICAP);  Surgeon: Charlanne Groom, MD;  Location: [facility];  Service: Gastroenterology;  Laterality: N/A;   HOT HEMOSTASIS N/A 12/09/2020   Procedure: HOT HEMOSTASIS (ARGON PLASMA COAGULATION/BICAP);  Surgeon: Eda Iha, MD;  Location: [facility] ENDOSCOPY;  Service: Gastroenterology;  Laterality: N/A;   HOT HEMOSTASIS N/A 10/03/2023   Procedure: EGD, WITH ARGON PLASMA COAGULATION;  Surgeon: San Sandor GAILS, DO;  Location: [facility] ENDOSCOPY;  Service: Gastroenterology;  Laterality: N/A;   HOT HEMOSTASIS N/A 10/14/2023   Procedure: EGD, WITH ARGON PLASMA COAGULATION;  Surgeon: Leigh Elspeth SQUIBB, MD;  Location: [facility] ENDOSCOPY;  Service: Gastroenterology;  Laterality: N/A;   LYMPHADENECTOMY Bilateral 02/27/2014   Procedure: BILATERAL LYMPHADENECTOMY;  Surgeon: Ricardo Likens, MD;  Location: [facility] ORS;  Service: Urology;  Laterality: Bilateral;   POLYPECTOMY  05/23/2020   Procedure: POLYPECTOMY;  Surgeon: Charlanne Groom, MD;  Location: [facility] ENDOSCOPY;  Service: Gastroenterology;;   PROSTATE BIOPSY  12/2013   Gleason 4+3=7, volume 31.31 cc   ROBOT ASSISTED LAPAROSCOPIC RADICAL PROSTATECTOMY N/A 02/27/2014   Procedure: ROBOTIC ASSISTED LAPAROSCOPIC RADICAL PROSTATECTOMY WITH INDOCYANINE GREEN DYE;  Surgeon: Ricardo Likens, MD;  Location: [facility] ORS;  Service: Urology;  Laterality: N/A;   Family History Family History  Problem Relation Age of Onset   Heart disease Mother    Heart attack Father 75   Cancer Sister        breast   Colon cancer Neg Hx    Esophageal cancer Neg Hx    Rectal cancer Neg Hx    Stomach cancer Neg Hx     Social History Social History   Tobacco Use   Smoking status: Former    Current packs/day: 0.50    Types: Cigarettes   Smokeless tobacco: Never   Tobacco comments:    1 ppd Wants Patches .  Stopped 1.5 weeks ago   Vaping Use   Vaping status: Never Used  Substance Use Topics   Alcohol use:  Not Currently    Alcohol/week: 1.0 standard drink of alcohol    Types: 1 Cans of beer per week   Drug use: Not Currently    Types: Marijuana, Cocaine, Heroin  Comment: past hx approx 30 years ago    Allergies Penicillins  Review of Systems Review of Systems  Physical Exam Vital Signs  I have reviewed the triage vital signs BP (!) 151/85 (BP Location: Right Arm)   Pulse 80   Temp 97.7 F (36.5 C) (Oral)   Resp 16   Ht 6' (1.829 m)   Wt 66 kg   SpO2 100%   BMI 19.73 kg/m   Physical Exam Vitals and nursing note reviewed.  Eyes:     Pupils: Pupils are equal, round, and reactive to light.  Cardiovascular:     Rate and Rhythm: Normal rate.  Abdominal:     General: Abdomen is flat.  Neurological:     Mental Status: He is alert.     ED Results and Treatments Labs (all labs ordered are listed, but only abnormal results are displayed) Labs Reviewed  COMPREHENSIVE METABOLIC PANEL WITH GFR - Abnormal; Notable for the following components:      Result Value   CO2 21 (*)    Glucose, Bld 187 (*)    BUN 73 (*)    Creatinine, Ser 4.57 (*)    Calcium  8.3 (*)    Albumin 3.1 (*)    AST 59 (*)    ALT 56 (*)    GFR, Estimated 13 (*)    All other components within normal limits  CBC - Abnormal; Notable for the following components:   WBC 3.1 (*)    RBC 2.17 (*)    Hemoglobin 6.7 (*)    HCT 21.1 (*)    Platelets 120 (*)    All other components within normal limits  TYPE AND SCREEN  PREPARE RBC (CROSSMATCH)                                                                                                                          Radiology No results found.  Pertinent labs & imaging results that were available during my care of the patient were reviewed by me and considered in my medical decision making (see MDM for details).  Medications Ordered in ED Medications  0.9 %  sodium chloride  infusion (Manually program via Guardrails IV Fluids) (has no administration in time  range)  pantoprazole  (PROTONIX ) injection 40 mg (40 mg Intravenous Given 11/23/23 2140)  Procedures .Critical Care  Performed by: Mannie Fairy DASEN, DO Authorized by: Mannie Fairy DASEN, DO   Critical care provider statement:    Critical care time (minutes):  33   Critical care was necessary to treat or prevent imminent or life-threatening deterioration of the following conditions: Severe blood loss anemia.   Critical care was time spent personally by me on the following activities:  Development of treatment plan with patient or surrogate, discussions with consultants, evaluation of patient's response to treatment, examination of patient, ordering and review of laboratory studies, ordering and review of radiographic studies, ordering and performing treatments and interventions, pulse oximetry, re-evaluation of patient's condition and review of old charts   (including critical care time)  Medical Decision Making / ED Course   This patient presents to the ED for concern of anemia GI bleed, this involves an extensive number of treatment options, and is a complaint that carries with it a high risk of complications and morbidity.  The differential diagnosis includes GI bleed, history of AVM, chronic anemia.  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  He is on Protonix , octreotide .  I did appreciate dark stool in the patient's brief, there is no brisk bleeding.  Appears to be melena.  Patient's hemoglobin here 6.7.  He is hemodynamically stable.  Will begin to transfuse.  Protonix  ordered.  Will admit patient to hospitalist service.   Additional history obtained: -Additional history obtained from EMS -External records from outside source obtained and reviewed including: Chart review including previous notes, labs, imaging, consultation notes   Lab  Tests: -I ordered, reviewed, and interpreted labs.   The pertinent results include:   Labs Reviewed  COMPREHENSIVE METABOLIC PANEL WITH GFR - Abnormal; Notable for the following components:      Result Value   CO2 21 (*)    Glucose, Bld 187 (*)    BUN 73 (*)    Creatinine, Ser 4.57 (*)    Calcium  8.3 (*)    Albumin 3.1 (*)    AST 59 (*)    ALT 56 (*)    GFR, Estimated 13 (*)    All other components within normal limits  CBC - Abnormal; Notable for the following components:   WBC 3.1 (*)    RBC 2.17 (*)    Hemoglobin 6.7 (*)    HCT 21.1 (*)    Platelets 120 (*)    All other components within normal limits  TYPE AND SCREEN  PREPARE RBC (CROSSMATCH)        Imaging Studies ordered:  I independently visualized and interpreted imaging. I agree with the radiologist interpretation   Medicines ordered and prescription drug management: Meds ordered this encounter  Medications   pantoprazole  (PROTONIX ) injection 40 mg   0.9 %  sodium chloride  infusion (Manually program via Guardrails IV Fluids)    -I have reviewed the patients home medicines and have made adjustments as needed  Cardiac Monitoring: The patient was maintained on a cardiac monitor.  I personally viewed and interpreted the cardiac monitored which showed an underlying rhythm of: Normal sinus rhythm  Social Determinants of Health:  Factors impacting patients care include: Multiple medical pulmonary disease including chronic GI bleeds, dementia   Reevaluation: After the interventions noted above, I reevaluated the patient and found that they have :improved  Co morbidities that complicate the patient evaluation  Past Medical History:  Diagnosis Date   Acute metabolic encephalopathy 10/22/2020   Acute metabolic encephalopathy 10/22/2020  Alcohol use 04/23/2020   Alcohol withdrawal seizure with complication, with unspecified complication (HCC) 11/08/2018   Angiodysplasia of stomach and duodenum with bleeding     CAO (chronic airflow obstruction) (HCC)    Cataract    OD   Chronic kidney disease, stage 4 (severe) (HCC)    Cognitive communication deficit    CVA (cerebral vascular accident) (HCC)    Depression    Diabetes mellitus without complication (HCC)    Diabetic retinopathy (HCC)    NPDR OU   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)    Epilepsy (HCC)    GERD (gastroesophageal reflux disease)    if drinks alcohol   GI bleed 10/05/2020   HAV (hallux abducto valgus) 01/17/2013   Patient is approximately 5-week status post bunion correction left foot   Hyperlipidemia    Hypertension    Hypertensive retinopathy    OU   Hypoglycemia 10/22/2020   Malignant neoplasm of prostate (HCC) 01/09/2014   Neuropathy    Pancreatitis    Pneumococcal vaccination administered at current visit 11/10/2020   Prostate cancer (HCC) 12/19/2013   Gleason 4+3=7, volume 31.31 cc   Rhabdomyolysis 04/12/2021   Sciatica    Shortness of breath dyspnea    with exertion    Viral hepatitis C       Final Clinical Impression(s) / ED Diagnoses Final diagnoses:  Iron  deficiency anemia, unspecified iron  deficiency anemia type  Gastrointestinal hemorrhage, unspecified gastrointestinal hemorrhage type     @PCDICTATION @    Mannie Pac T, DO 11/23/23 2210

## 2023-11-23 NOTE — ED Triage Notes (Signed)
 Pt to ED via [facility] from [facility] c/o abnormal lab.  Per EMS pt sent out for low hemoglobin, EMS unsure what hemoglobin level was or when labs drawn.  EMS states A&Ox4; hx stroke with left side deficits, dementia, and DBM 2, but has no complaints.  EMS vitals: 198/96 BP, 90 HR, 100% RA, 221 CBG

## 2023-11-24 DIAGNOSIS — D500 Iron deficiency anemia secondary to blood loss (chronic): Secondary | ICD-10-CM

## 2023-11-24 DIAGNOSIS — K5521 Angiodysplasia of colon with hemorrhage: Secondary | ICD-10-CM

## 2023-11-24 DIAGNOSIS — D509 Iron deficiency anemia, unspecified: Secondary | ICD-10-CM | POA: Diagnosis not present

## 2023-11-24 DIAGNOSIS — D696 Thrombocytopenia, unspecified: Secondary | ICD-10-CM | POA: Diagnosis not present

## 2023-11-24 DIAGNOSIS — K922 Gastrointestinal hemorrhage, unspecified: Secondary | ICD-10-CM | POA: Diagnosis not present

## 2023-11-24 LAB — BASIC METABOLIC PANEL WITH GFR
Anion gap: 10 (ref 5–15)
BUN: 69 mg/dL — ABNORMAL HIGH (ref 8–23)
CO2: 20 mmol/L — ABNORMAL LOW (ref 22–32)
Calcium: 8.5 mg/dL — ABNORMAL LOW (ref 8.9–10.3)
Chloride: 106 mmol/L (ref 98–111)
Creatinine, Ser: 4.26 mg/dL — ABNORMAL HIGH (ref 0.61–1.24)
GFR, Estimated: 14 mL/min — ABNORMAL LOW (ref >60)
Glucose, Bld: 143 mg/dL — ABNORMAL HIGH (ref 70–99)
Potassium: 4.3 mmol/L (ref 3.5–5.1)
Sodium: 136 mmol/L (ref 135–145)

## 2023-11-24 LAB — BPAM RBC
Blood Product Expiration Date: 202508312359
ISSUE DATE / TIME: 202508062304
Unit Type and Rh: 5100

## 2023-11-24 LAB — TYPE AND SCREEN
ABO/RH(D): O POS
Antibody Screen: NEGATIVE
Unit division: 0

## 2023-11-24 LAB — CBC
HCT: 25 % — ABNORMAL LOW (ref 39.0–52.0)
Hemoglobin: 8.4 g/dL — ABNORMAL LOW (ref 13.0–17.0)
MCH: 31 pg (ref 26.0–34.0)
MCHC: 33.6 g/dL (ref 30.0–36.0)
MCV: 92.3 fL (ref 80.0–100.0)
Platelets: 121 K/uL — ABNORMAL LOW (ref 150–400)
RBC: 2.71 MIL/uL — ABNORMAL LOW (ref 4.22–5.81)
RDW: 14.9 % (ref 11.5–15.5)
WBC: 3.3 K/uL — ABNORMAL LOW (ref 4.0–10.5)
nRBC: 0 % (ref 0.0–0.2)

## 2023-11-24 LAB — HEPATITIS PANEL, ACUTE
HCV Ab: REACTIVE — AB
Hep A IgM: NONREACTIVE
Hep B C IgM: NONREACTIVE
Hepatitis B Surface Ag: NONREACTIVE

## 2023-11-24 LAB — HEPATITIS B SURFACE ANTIBODY,QUALITATIVE: Hep B S Ab: NONREACTIVE

## 2023-11-24 MED ORDER — HEPATITIS B VAC RECOMB ADJ 20 MCG/0.5ML IM SOSY: 0.5000 mL | PREFILLED_SYRINGE | Freq: Once | INTRAMUSCULAR | Status: DC

## 2023-11-24 MED ORDER — POLYETHYLENE GLYCOL 3350 17 GM/SCOOP PO POWD
119.0000 g | Freq: Once | ORAL | Status: DC
  Filled 2023-11-24: qty 119

## 2023-11-24 MED ORDER — MELATONIN 3 MG PO TABS
3.0000 mg | ORAL_TABLET | Freq: Every evening | ORAL | Status: DC | PRN
  Administered 2023-11-24 – 2023-11-25 (×2): 3 mg via ORAL
  Filled 2023-11-24 (×2): qty 1

## 2023-11-24 MED ORDER — HEPATITIS B VAC RECOMB ADJ 20 MCG/0.5ML IM SOSY
0.5000 mL | PREFILLED_SYRINGE | Freq: Once | INTRAMUSCULAR | Status: DC
  Filled 2023-11-24: qty 0.5

## 2023-11-24 MED ORDER — PANTOPRAZOLE SODIUM 40 MG PO TBEC
40.0000 mg | DELAYED_RELEASE_TABLET | Freq: Two times a day (BID) | ORAL | Status: DC
  Administered 2023-11-24 – 2023-11-28 (×9): 40 mg via ORAL
  Filled 2023-11-24 (×9): qty 1

## 2023-11-24 MED ORDER — SODIUM CHLORIDE 0.9 % IV SOLN: INTRAVENOUS | Status: DC

## 2023-11-24 NOTE — TOC Progression Note (Signed)
 Transition of Care [facility]) - Progression Note    Patient Details  Name: Gregory Nunez. MRN: [id number] Date of Birth: 1945-08-31  [facility] CM/SW Contact  Rosaline JONELLE Joe, RN Phone Number: 11/24/2023, 9:07 AM  Clinical Narrative:    Patient admitted to the hospital from [facility] facility with GI bleeding.  IP Care management team will continue to follow the patient as he progresses and facilitate patient's return to the facility when stable.                     Expected Discharge Plan and Services                                               Social Drivers of Health (SDOH) Interventions SDOH Screenings   Food Insecurity: Patient Unable To Answer (11/24/2023)  Housing: Low Risk  (11/24/2023)  Transportation Needs: Patient Unable To Answer (11/24/2023)  Utilities: Patient Unable To Answer (11/24/2023)  Alcohol Screen: Low Risk  (09/21/2023)  Depression (PHQ2-9): Low Risk  (11/03/2022)  Financial Resource Strain: Low Risk  (09/21/2023)  Physical Activity: Insufficiently Active (09/21/2023)  Social Connections: Patient Unable To Answer (11/24/2023)  Recent Concern: Social Connections - Socially Isolated (09/21/2023)  Stress: No Stress Concern Present (09/21/2023)  Tobacco Use: Medium Risk (11/23/2023)  Health Literacy: Adequate Health Literacy (11/03/2022)    Readmission Risk Interventions    10/17/2023    4:48 PM 06/20/2023    2:55 PM 06/15/2023    3:00 PM  Readmission Risk Prevention Plan  Post Dischage Appt   Complete  Medication Screening   Complete  Transportation Screening Complete Complete Complete  PCP or Specialist Appt within 5-7 Days  Complete   Home Care Screening  Complete   Medication Review (RN CM)  Complete

## 2023-11-24 NOTE — TOC Progression Note (Signed)
 Transition of Care [facility]) - Progression Note    Patient Details  Name: Gregory Nunez. MRN: [id number] Date of Birth: Mar 05, 1946  Transition of Care [facility]) CM/SW Contact  Ivet Guerrieri A [place], LCSW Phone Number: 11/24/2023, 3:02 PM  Clinical Narrative:     CSW was notified by Kia at [facility] that pt is short term rehab at [facility], will need a PT/OT eval to return so authorization back to facility can be conducted.  MD notified.   CSW will continue to follow.                       Expected Discharge Plan and Services                                               Social Drivers of Health (SDOH) Interventions SDOH Screenings   Food Insecurity: Patient Unable To Answer (11/24/2023)  Housing: Low Risk  (11/24/2023)  Transportation Needs: Patient Unable To Answer (11/24/2023)  Utilities: Patient Unable To Answer (11/24/2023)  Alcohol Screen: Low Risk  (09/21/2023)  Depression (PHQ2-9): Low Risk  (11/03/2022)  Financial Resource Strain: Low Risk  (09/21/2023)  Physical Activity: Insufficiently Active (09/21/2023)  Social Connections: Patient Unable To Answer (11/24/2023)  Recent Concern: Social Connections - Socially Isolated (09/21/2023)  Stress: No Stress Concern Present (09/21/2023)  Tobacco Use: Medium Risk (11/23/2023)  Health Literacy: Adequate Health Literacy (11/03/2022)    Readmission Risk Interventions    10/17/2023    4:48 PM 06/20/2023    2:55 PM 06/15/2023    3:00 PM  Readmission Risk Prevention Plan  Post Dischage Appt   Complete  Medication Screening   Complete  Transportation Screening Complete Complete Complete  PCP or Specialist Appt within 5-7 Days  Complete   Home Care Screening  Complete   Medication Review (RN CM)  Complete

## 2023-11-24 NOTE — Plan of Care (Signed)

## 2023-11-24 NOTE — Progress Notes (Signed)
 HD#1 SUBJECTIVE:  Patient Summary: Gregory Nunez. is a 78 y.o. 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   Overnight Events: none  Interim History: Patient evaluated at bedside. He feels well this morning. He denies belly pain or headaches today. He doesn't recall using the bathroom yet this morning. Overall he reports that he feels good. He does not recall having low blood levels in the past. He is oriented to person, but not oriented to his own birthday. He does recall that it is August currently. He is unsure why he was brought here.   OBJECTIVE:  Vital Signs: Vitals:   11/24/23 0333 11/24/23 0334 11/24/23 0904 11/24/23 0904  BP: (!) 156/84 (!) 156/84 (!) 159/79 (!) 159/79  Pulse: 83 84 81 81  Resp: 16 18    Temp: (!) 97.5 F (36.4 C) (!) 97.5 F (36.4 C) 98.2 F (36.8 C) 98.2 F (36.8 C)  TempSrc: Oral  Oral   SpO2:  100% 100% 100%  Weight:      Height:       Supplemental O2: Room Air SpO2: 100 %  Filed Weights   11/23/23 2102  Weight: 66 kg     Intake/Output Summary (Last 24 hours) at 11/24/2023 1053 Last data filed at 11/24/2023 0334 Gross per 24 hour  Intake 598 ml  Output --  Net 598 ml   Net IO Since Admission: 598 mL [11/24/23 1053]  Physical Exam: Physical Exam Constitutional:      Appearance: Normal appearance.  Eyes:     Extraocular Movements: Extraocular movements intact.     Pupils: Pupils are equal, round, and reactive to light.  Cardiovascular:     Rate and Rhythm: Normal rate and regular rhythm.     Pulses: Normal pulses.  Pulmonary:     Effort: Pulmonary effort is normal.     Breath sounds: Normal breath sounds.  Abdominal:     General: Abdomen is flat. There is no distension.     Palpations: Abdomen is soft.     Tenderness:  There is no abdominal tenderness. There is no guarding.  Skin:    General: Skin is warm and dry.     Coloration: Skin is not pale.  Neurological:     Mental Status: He is alert. Mental status is at baseline.     Comments: Oriented to self  Psychiatric:        Mood and Affect: Mood normal.        Behavior: Behavior normal.     Patient Lines/Drains/Airways Status     Active Line/Drains/Airways     Name Placement date Placement time Site Days   Peripheral IV 11/24/23 22 G 1 Anterior;Right;Upper Arm 11/24/23  0412  Arm  less than 1            Pertinent labs and imaging:      Latest Ref Rng & Units 11/24/2023    7:50 AM 11/23/2023    9:15 PM 10/17/2023    6:19 AM  CBC  WBC 4.0 - 10.5 K/uL 3.3  3.1  5.2   Hemoglobin 13.0 - 17.0 g/dL 8.4  6.7  8.4   Hematocrit 39.0 - 52.0 % 25.0  21.1  25.3   Platelets 150 - 400 K/uL 121  120  171  Latest Ref Rng & Units 11/24/2023    7:50 AM 11/23/2023    9:15 PM 10/17/2023    6:19 AM  CMP  Glucose 70 - 99 mg/dL 856  812  897   BUN 8 - 23 mg/dL 69  73  85   Creatinine 0.61 - 1.24 mg/dL 5.73  5.42  5.36   Sodium 135 - 145 mmol/L 136  139  139   Potassium 3.5 - 5.1 mmol/L 4.3  4.8  4.7   Chloride 98 - 111 mmol/L 106  107  110   CO2 22 - 32 mmol/L 20  21  20    Calcium  8.9 - 10.3 mg/dL 8.5  8.3  8.7   Total Protein 6.5 - 8.1 g/dL  7.6    Total Bilirubin 0.0 - 1.2 mg/dL  0.5    Alkaline Phos 38 - 126 U/L  56    AST 15 - 41 U/L  59    ALT 0 - 44 U/L  56      No results found.  ASSESSMENT/PLAN:  Assessment: Principal Problem:   GI bleeding  Gregory Nunez. is a 78 y.o. 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   Plan: #Chronic duodenal AVM #Acute on Chronic anemia d/t GI bleeding #Anemia of Chronic  Disease Patient brought by EMS with a hemoglobin of 6.7. At his SNF, his Hgb reportedly 6.3. He received 1 unit of packed red blood cells, and 40 mg of IV Protonix . In the ED dark stool was appreciated in the patient's briefs without brisk bleeding that appeared to be melena.  Hemodynamically stable with no pertinent exam findings on initial evaluation. Hgb 8.4 on repeat CBC post-transfusion.  Patient had push enteroscopy on 6/27 during last admission, showing multiple AVMs in the duodenum that were treated with argon plasma coagulation. Normocytic anemia likely due to known duodenal AVMs, CKD stage 5 which may be an additional contributing factor.  During last admission, GI recommended outpatient follow-up, capsule endoscopy if still bleeding, PPI twice daily, octreotide  Depo shot every 28 days, and caution with iron  pill.  Patient on home Protonix  40 mg twice daily and octreotide  20 mg injection at his SNF.  He currently has an appointment with [facility] on 8/22.  Consult GI for further recommendations on whether patient would benefit from capsule endoscopy during this hospital admission. - GI consult - Trend CBC, monitor H&H daily - Transfuse if hemoglobin < 7 - IV Protonix  40 mg twice daily   #Mild Transaminitis #Chronic HCV AST 59 and ALT 56 slightly elevated from baseline on admission.  Previously by Dr. Eben on 11/23/22 for chronic HCV and treated with Mavyret  which was stopped at his previous hospitalization in February 2025.  Fib4 score is 4.99 so he would likely need GI follow-up for elastogram.  He was supposed to receive this in August 2024, but this was not completed. Will discuss the need to restart Mavyret  while inpatient, and if patient needs further workup to assess for fibrosis. - Pending hepatitis panel to monitor current infection. -- Pending ID recommendations for restarting Mavyret  and Fibrosure exam  -- Assess for cirrhosis stigmata   #History of Ischemic CVA with residual  dysarthria #Vascular dementia Patient has a history of stroke with left-sided defects due to ischemic stroke  on 09/21/23 with infarcts in the left thalamus and parietal white matter.  Appears to be at baseline mental status, which is A&O to self and situation.  Holding aspirin  given current GI bleed. -- Continue home atorvastatin  80 mg daily. - Hold home aspirin  81 mg  #Hypertension Blood pressure on admission is 151/85.  Continue home medications. - Amlodipine  10 mg daily - Hydralazine  25 mg daily  #T2DM, controlled # A1c of 5.1% on 6/5.  Glucose elevated on admission at 187.  Not taking any antihyperglycemics currently. - Monitor, follow-up with PCP outpatient  #Leukopenia #Thrombocytopenia White blood cell 3.1 and platelets 120 on initial CBC.  Both appear to be chronic and stable.  Continue to monitor.   -- On heparin  for VTE, will monitor for signs of HIT.  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 Creatinine around baseline at 4.57, stable. Heparin  for VTE due to renal dysfunction. - Continue to monitor   Best Practice: Diet: Regular diet IVF: Fluids: 0.9NS, Rate: 42mL/hr VTE: heparin  injection 5,000 Units Start: 11/24/23 1400 Code: Full  Disposition planning: Therapy Recs: SNF, [facility] Family Contact: Nephew DISPO: Anticipated discharge in 1-2 days to Skilled nursing facility pending clinical improvement.    Signature:  Jonael Paradiso [facility]  10:53 AM, 11/24/2023  On Call pager [phone]

## 2023-11-24 NOTE — Consult Note (Signed)
 Consultation Note   Referring Provider:  Teaching Service PCP: Charmayne Holmes, DO Primary Gastroenterologist:     Elspeth Naval, MD Reason for Consultation:  GI bleed DOA: 11/23/2023         Hospital Day: 2   ASSESSMENT    78 yo male with a history of iron  deficiency anemia, small bowel AVMs, colon polyps, CKD IV / V, DM, hypertension, alcohol abuse, thiamine  deficiency,  HCV, cholelithiasis , and CVA with residual dysarthria.  Admitted with recurrent decline in hemoglobin  Recurrent acute on chronic anemia AVM disease Decline in hemoglobin most likely secondary to bleeding AVMs.  He was started on subcu octreotide  at the time of his last hospitalization in late June.  However according to the H&P, SNF was unable to confirm whether or not he has since gotten an injection though it looks like the medication was dispensed by the pharmacy .    CKD5  Chronic, thrombocytopenia No findings of cirrhosis on prior ultrasounds though there is a history of alcohol abuse.  INR's have been normal. Hasn't had abdominal imaging so unknown if spleen enlarged.  Yes  History of colon polyps Two subcentimeter tubular adenomas removed in February 2022  History of ischemic CVA with residual dysarthria Based on outpatient med list he apparently no longer takes Plavix , aspirin  only   Mild transaminitis  HCV Treated with Mavyret  ( ? Completed).  HCV quantitative was 21K in June.    Principal Problem:   GI bleeding      PLAN:   --Small bowel video capsule study tomorrow.  Not easy to communicate with patient given expressive aphasia.  I did try and contact his nephew Ozell to update him on the plan but got a voicemail -- have ordered clear liquid diet the remainder of the day, n.p.o. after midnight, one half MiraLAX  prep this evening --Trend H&H.  Hemoglobin improved a little more than expected with only 1 unit of blood (6.7>>8.4)  HPI   GI  History   78 y.o. year old male whose had several previous admissions for GI bleed secondary to AVM disease found on multiple upper endoscopies.  We saw him during hospitalization in late June for recurrent GI bleeding.  At that time he was on Plavix  for recent stroke. He underwent inpatient small bowel enteroscopy with findings  of multiple duodenal AVMs.  He was started on Sandostatin  injections upon discharge.  At the time of discharge his hemoglobin was 8.4 after blood transfusions.  10/14/2023 Small bowel enteroscopy - Normal esophagus. - Erythematous mucosa in the antrum. - Normal stomach otherwise. - A single non-bleeding angiodysplastic lesion in the duodenum. Treated with argon plasma coagulation (APC). - Five non-bleeding angiodysplastic lesions in the proximal duodenum. Treated with argon plasma coagulation (APC). - Distal most extent of proximal jejunum reached was tattooed. - Normal remainder of examined small bowel  Last Colonoscopy was Feb 2022 - Three 4 to 6 mm polyps in the proximal ascending colon and in the mid ascending colon, removed with a cold snare. Resected and retrieved. - One 6 mm polyp in the proximal sigmoid colon, removed with a cold snare. Resected and retrieved. - One 4 mm polyp in the distal rectum, removed with a cold biopsy forceps.  Resected and retrieved. - Small lipoma in the mid ascending colon. - Non-bleeding internal hemorrhoids. - The examined portion of the ileum was normal. - The examination was otherwise normal on direct and retroflexion views B. COLON, CECUM, POLYPECTOMY:  - Tubular adenoma(s) without high-grade dysplasia or malignancy   C. COLON, SIGMOID, RECTUM, POLYPECTOMY:  - Tubular adenoma without high-grade dysplasia or malignancy  - Hyperplastic polyp(s    Interval History  Gregory Nunez was admitted yesterday for evaluation of a low hgb found on labs at [facility].  Apparently there was dark stool in his briefs in the ED.  according to the H&P it is unclear whether SNF has been administering the octreotide .   Hemoglobin 6.7 WBC 3.1 Platelets 121  Labs and Imaging:  Recent Labs    11/23/23 2115  PROT 7.6  ALBUMIN 3.1*  AST 59*  ALT 56*  ALKPHOS 56  BILITOT 0.5   Recent Labs    11/23/23 2115 11/24/23 0750  WBC 3.1* 3.3*  HGB 6.7* 8.4*  HCT 21.1* 25.0*  MCV 97.2 92.3  PLT 120* 121*   Recent Labs    11/23/23 2115 11/24/23 0750  NA 139 136  K 4.8 4.3  CL 107 106  CO2 21* 20*  GLUCOSE 187* 143*  BUN 73* 69*  CREATININE 4.57* 4.26*  CALCIUM  8.3* 8.5*     VAS US  CAROTID Carotid Arterial Duplex Study  Patient Name:  Gregory Nunez.  Date of Exam:   09/23/2023 Medical Rec #: [id number]                Accession #:    [id number] Date of Birth: 1946/02/27                Patient Gender: M Patient Age:   60 years Exam Location:  [facility] Procedure:      VAS US  CAROTID Referring Phys: PRAMOD SETHI  --------------------------------------------------------------------------------   Indications:       CVA. Risk Factors:      Hypertension, hyperlipidemia, Diabetes, current smoker, prior                    CVA. Other Factors:     CKD,. Comparison Study:  No previous exams  Performing Technologist: Jody Hill RVT, RDMS    Examination Guidelines: A complete evaluation includes B-mode imaging, spectral Doppler, color Doppler, and power Doppler as needed of all accessible portions of each vessel. Bilateral testing is considered an integral part of a complete examination. Limited examinations for reoccurring indications may be performed as noted.    Right Carotid Findings: +----------+--------+--------+--------+------------------+------------------+           PSV cm/sEDV cm/sStenosisPlaque DescriptionComments           +----------+--------+--------+--------+------------------+------------------+ CCA Prox  108     10                                                    +----------+--------+--------+--------+------------------+------------------+ CCA Distal78      11                                intimal thickening +----------+--------+--------+--------+------------------+------------------+ ICA Prox  42      11  focal and calcific                   +----------+--------+--------+--------+------------------+------------------+ ICA Distal37      10                                                   +----------+--------+--------+--------+------------------+------------------+ ECA       80      0                                                    +----------+--------+--------+--------+------------------+------------------+  +----------+--------+-------+----------------+-------------------+           PSV cm/sEDV cmsDescribe        Arm Pressure (mmHG) +----------+--------+-------+----------------+-------------------+ [id number]            Multiphasic, WNL                    +----------+--------+-------+----------------+-------------------+  +---------+--------+--+--------+-+---------+ VertebralPSV cm/s46EDV cm/s9Antegrade +---------+--------+--+--------+-+---------+     Left Carotid Findings: +----------+--------+--------+--------+---------------------+------------------+           PSV cm/sEDV cm/sStenosisPlaque Description   Comments           +----------+--------+--------+--------+---------------------+------------------+ CCA Prox  111     14                                                      +----------+--------+--------+--------+---------------------+------------------+ CCA Distal86      11                                   intimal thickening +----------+--------+--------+--------+---------------------+------------------+ ICA Prox  32      9               focal and                                                                 heterogenous                             +----------+--------+--------+--------+---------------------+------------------+ ICA Distal29      9                                                       +----------+--------+--------+--------+---------------------+------------------+ ECA       70      0                                                       +----------+--------+--------+--------+---------------------+------------------+  +----------+--------+--------+----------------+-------------------+  PSV cm/sEDV cm/sDescribe        Arm Pressure (mmHG) +----------+--------+--------+----------------+-------------------+ [id number]              Multiphasic, WNL                    +----------+--------+--------+----------------+-------------------+  +---------+--------+--+--------+-+---------+ VertebralPSV cm/s28EDV cm/s6Antegrade +---------+--------+--+--------+-+---------+        Summary: Right Carotid: The extracranial vessels were near-normal with only minimal wall                thickening or plaque.  Left Carotid: The extracranial vessels were near-normal with only minimal wall               thickening or plaque.  Vertebrals:  Bilateral vertebral arteries demonstrate antegrade flow. Subclavians: Normal flow hemodynamics were seen in bilateral subclavian              arteries.  *See table(s) above for measurements and observations.    Electronically signed by Eather Popp MD on 09/26/2023 at 8:00:57 AM.      Final      Past Medical History:  Diagnosis Date   Acute metabolic encephalopathy 10/22/2020   Acute metabolic encephalopathy 10/22/2020   Alcohol use 04/23/2020   Alcohol withdrawal seizure with complication, with unspecified complication (HCC) 11/08/2018   Angiodysplasia of stomach and duodenum with bleeding    CAO (chronic airflow obstruction) (HCC)    Cataract    OD   Chronic kidney disease, stage 4 (severe) (HCC)    Cognitive communication  deficit    CVA (cerebral vascular accident) (HCC)    Depression    Diabetes mellitus without complication (HCC)    Diabetic retinopathy (HCC)    NPDR OU   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)    Epilepsy (HCC)    GERD (gastroesophageal reflux disease)    if drinks alcohol   GI bleed 10/05/2020   HAV (hallux abducto valgus) 01/17/2013   Patient is approximately 5-week status post bunion correction left foot   Hyperlipidemia    Hypertension    Hypertensive retinopathy    OU   Hypoglycemia 10/22/2020   Malignant neoplasm of prostate (HCC) 01/09/2014   Neuropathy    Pancreatitis    Pneumococcal vaccination administered at current visit 11/10/2020   Prostate cancer (HCC) 12/19/2013   Gleason 4+3=7, volume 31.31 cc   Rhabdomyolysis 04/12/2021   Sciatica    Shortness of breath dyspnea    with exertion    Viral hepatitis C     Past Surgical History:  Procedure Laterality Date   BIOPSY  04/16/2018   Procedure: BIOPSY;  Surgeon: Kristie Lamprey, MD;  Location: [facility];  Service: Endoscopy;;   BIOPSY  05/23/2020   Procedure: BIOPSY;  Surgeon: Charlanne Groom, MD;  Location: [facility];  Service: Gastroenterology;;  EGD and COLON   BIOPSY  12/09/2020   Procedure: BIOPSY;  Surgeon: Eda Iha, MD;  Location: [facility] ENDOSCOPY;  Service: Gastroenterology;;   biopsy on throat     hx of    CATARACT EXTRACTION Bilateral    Dr. Medford Ferrier   COLONOSCOPY N/A 05/23/2020   Procedure: COLONOSCOPY;  Surgeon: Charlanne Groom, MD;  Location: [facility];  Service: Gastroenterology;  Laterality: N/A;   ENTEROSCOPY N/A 10/07/2020   Procedure: ENTEROSCOPY;  Surgeon: Avram Lupita BRAVO, MD;  Location: [facility] ENDOSCOPY;  Service: Endoscopy;  Laterality: N/A;   ENTEROSCOPY N/A 10/03/2023   Procedure: ENTEROSCOPY;  Surgeon: San Sandor GAILS, DO;  Location: [facility] ENDOSCOPY;  Service: Gastroenterology;  Laterality: N/A;   ENTEROSCOPY N/A 10/14/2023   Procedure: ENTEROSCOPY;   Surgeon: Leigh Elspeth SQUIBB, MD;  Location: [facility] ENDOSCOPY;  Service: Gastroenterology;  Laterality: N/A;   ESOPHAGOGASTRODUODENOSCOPY Left 04/16/2018   Procedure: ESOPHAGOGASTRODUODENOSCOPY (EGD);  Surgeon: Kristie Lamprey, MD;  Location: [facility];  Service: Endoscopy;  Laterality: Left;   ESOPHAGOGASTRODUODENOSCOPY (EGD) WITH PROPOFOL  N/A 05/23/2020   Procedure: ESOPHAGOGASTRODUODENOSCOPY (EGD) WITH PROPOFOL ;  Surgeon: Charlanne Groom, MD;  Location: [facility];  Service: Gastroenterology;  Laterality: N/A;   ESOPHAGOGASTRODUODENOSCOPY (EGD) WITH PROPOFOL  N/A 12/09/2020   Procedure: ESOPHAGOGASTRODUODENOSCOPY (EGD) WITH PROPOFOL ;  Surgeon: Eda Iha, MD;  Location: [facility] ENDOSCOPY;  Service: Gastroenterology;  Laterality: N/A;   EYE SURGERY     FOOT SURGERY     HOT HEMOSTASIS N/A 04/16/2018   Procedure: HOT HEMOSTASIS (ARGON PLASMA COAGULATION/BICAP);  Surgeon: Kristie Lamprey, MD;  Location: [facility];  Service: Endoscopy;  Laterality: N/A;   HOT HEMOSTASIS N/A 05/23/2020   Procedure: HOT HEMOSTASIS (ARGON PLASMA COAGULATION/BICAP);  Surgeon: Charlanne Groom, MD;  Location: [facility];  Service: Gastroenterology;  Laterality: N/A;   HOT HEMOSTASIS N/A 12/09/2020   Procedure: HOT HEMOSTASIS (ARGON PLASMA COAGULATION/BICAP);  Surgeon: Eda Iha, MD;  Location: [facility] ENDOSCOPY;  Service: Gastroenterology;  Laterality: N/A;   HOT HEMOSTASIS N/A 10/03/2023   Procedure: EGD, WITH ARGON PLASMA COAGULATION;  Surgeon: San Sandor GAILS, DO;  Location: [facility]OS[facility];  Service: Gastroenterology;  Laterality: N/A;   HOT HEMOSTASIS N/A 10/14/2023   Procedure: EGD, WITH ARGON PLASMA COAGULATION;  Surgeon: Leigh Elspeth SQUIBB, MD;  Location[facility]COPY;  Service: Gastroenterology;  Laterality: N/A;   LYMPHADENECTOMY Bilateral 02/27/2014   Procedure: BILATERAL LYMPHADENECTOMY;  Surgeon: Ricardo Likens, MD;  Location: [facility];  Service: Urology;  Laterality: Bilateral;   POLYPECTOMY  05/23/2020    Procedure: POLYPECTOMY;  Surgeon: Charlanne Groom, MD;  Location: [facility];  Service: Gastroenterology;;   PROSTATE BIOPSY  12/2013   Gleason 4+3=7, volume 31.31 cc   ROBOT ASSISTED LAPAROSCOPIC RADICAL PROSTATECTOMY N/A 02/27/2014   Procedure: ROBOTIC ASSISTED LAPAROSCOPIC RADICAL PROSTATECTOMY WITH INDOCYANINE GREEN DYE;  Surgeon: Ricardo Likens, MD;  Location: [facility];  Service: Urology;  Laterality: N/A;    Family History  Problem Relation Age of Onset   Heart disease Mother    Heart attack Father 47   Cancer Sister        breast   Colon cancer Neg Hx    Esophageal cancer Neg Hx    Rectal cancer Neg Hx    Stomach cancer Neg Hx     Prior to Admission medications   Medication Sig Start Date End Date Taking? Authorizing Provider  amLODipine  (NORVASC ) 10 MG tablet Take 1 tablet (10 mg total) by mouth daily. 06/20/23  Yes Jadine Toribio SQUIBB, MD  aspirin  EC 81 MG tablet Take 1 tablet (81 mg total) by mouth daily. Swallow whole. Patient taking differently: Take 81 mg by mouth every evening. Swallow whole. 09/25/23  Yes Tawkaliyar, Roya, DO  atorvastatin  (LIPITOR ) 80 MG tablet Take 1 tablet (80 mg total) by mouth daily. Patient taking differently: Take 80 mg by mouth every evening. 09/25/23  Yes Tawkaliyar, Roya, DO  hydrALAZINE  (APRESOLINE ) 25 MG tablet Take 1 tablet (25 mg total) by mouth every 8 (eight) hours. 10/14/22  Yes Lou Claretta HERO, MD  levETIRAcetam  (KEPPRA ) 500 MG tablet Take 500 mg by mouth 2 (two) times daily.   Yes [provider]  multivitamin (PROSIGHT) TABS tablet Take 1 tablet by mouth daily. 09/25/23  Yes  Tawkaliyar, Roya, DO  Nutritional Supplements (NUTRITIONAL DRINK) LIQD Take 90 mLs by mouth in the morning, at noon, and at bedtime. Med Plus 2.0   Yes [provider]  octreotide  (SANDOSTATIN  LAR) 20 MG injection Inject 20 mg into the muscle every 28 (twenty-eight) days. 10/17/23  Yes Elnora Ip, MD  pantoprazole  (PROTONIX ) 40 MG tablet Take 1  tablet (40 mg total) by mouth 2 (two) times daily. 10/17/23  Yes Elnora Ip, MD  sennosides-docusate sodium  (SENOKOT-S) 8.6-50 MG tablet Take 1 tablet by mouth daily.   Yes [provider]  thiamine  (VITAMIN B-1) 100 MG tablet Take 1 tablet (100 mg total) by mouth daily. 10/17/23  Yes Elnora Ip, MD  COMBIVENT RESPIMAT 20-100 MCG/ACT AERS respimat Inhale 1 puff into the lungs every 6 (six) hours as needed for shortness of breath. 06/12/18 08/19/19  [provider]  mometasone -formoterol  (DULERA) 100-5 MCG/ACT AERO Inhale 2 puffs into the lungs daily. Patient not taking: Reported on 08/19/2019 04/17/18 08/19/19  Rosario Eland I, MD  sucralfate  (CARAFATE ) 1 g tablet Take 1 tablet (1 g total) by mouth 4 (four) times daily. Patient not taking: Reported on 08/19/2019 04/17/18 08/19/19  Rosario Eland I, MD  tiotropium (SPIRIVA  HANDIHALER) 18 MCG inhalation capsule Place 1 capsule (18 mcg total) into inhaler and inhale daily. Patient not taking: Reported on 08/19/2019 04/17/18 08/19/19  Rosario Eland I, MD  traZODone  (DESYREL ) 50 MG tablet Take 50 mg by mouth at bedtime as needed for sleep. Patient not taking: Reported on 10/06/2020  10/08/20  [provider]    Current Facility-Administered Medications  Medication Dose Route Frequency Provider Last Rate Last Admin   0.9 %  sodium chloride  infusion (Manually program via Guardrails IV Fluids)   Intravenous Once Mannie Pac T, DO       amLODipine  (NORVASC ) tablet 10 mg  10 mg Oral Daily Nooruddin, Saad, MD   10 mg at 11/24/23 0937   atorvastatin  (LIPITOR ) tablet 80 mg  80 mg Oral QPM Nooruddin, Saad, MD       heparin  injection 5,000 Units  5,000 Units Subcutaneous Q8H Nooruddin, Saad, MD       hydrALAZINE  (APRESOLINE ) tablet 25 mg  25 mg Oral Q8H Nooruddin, Saad, MD   25 mg at 11/24/23 9389   pantoprazole  (PROTONIX ) EC tablet 40 mg  40 mg Oral BID Jolaine Pac, DO   40 mg at 11/24/23 1238    Allergies  as of 11/23/2023 - Review Complete 11/23/2023  Allergen Reaction Noted   Penicillins Other (See Comments) 05/16/2011    Social History   Socioeconomic History   Marital status: Married    Spouse name: Not on file   Number of children: Not on file   Years of education: Not on file   Highest education level: Not on file  Occupational History   Not on file  Tobacco Use   Smoking status: Former    Current packs/day: 0.50    Types: Cigarettes   Smokeless tobacco: Never   Tobacco comments:    1 ppd Wants Patches .  Stopped 1.5 weeks ago   Vaping Use   Vaping status: Never Used  Substance and Sexual Activity   Alcohol use: Not Currently    Alcohol/week: 1.0 standard drink of alcohol    Types: 1 Cans of beer per week   Drug use: Not Currently    Types: Marijuana, Cocaine, Heroin    Comment: past hx approx 30 years ago    Sexual activity: Not  on file  Other Topics Concern   Not on file  Social History Narrative   Not on file   Social Drivers of Health   Financial Resource Strain: Low Risk  (09/21/2023)   Overall Financial Resource Strain (CARDIA)    Difficulty of Paying Living Expenses: Not hard at all  Food Insecurity: Patient Unable To Answer (11/24/2023)   Hunger Vital Sign    Worried About Programme researcher, broadcasting/film/video in the Last Year: Patient unable to answer    Ran Out of Food in the Last Year: Patient unable to answer  Transportation Needs: Patient Unable To Answer (11/24/2023)   PRAPARE - Transportation    Lack of Transportation (Medical): Patient unable to answer    Lack of Transportation (Non-Medical): Patient unable to answer  Physical Activity: Insufficiently Active (09/21/2023)   Exercise Vital Sign    Days of Exercise per Week: 2 days    Minutes of Exercise per Session: 30 min  Stress: No Stress Concern Present (09/21/2023)   [employer] of Occupational Health - Occupational Stress Questionnaire    Feeling of Stress : Not at all  Social Connections: Patient Unable To  Answer (11/24/2023)   Social Connection and Isolation Panel    Frequency of Communication with Friends and Family: Patient unable to answer    Frequency of Social Gatherings with Friends and Family: Patient unable to answer    Attends Religious Services: Patient unable to answer    Active Member of Clubs or Organizations: Patient unable to answer    Attends Banker Meetings: Patient unable to answer    Marital Status: Patient unable to answer  Recent Concern: Social Connections - Socially Isolated (09/21/2023)   Social Connection and Isolation Panel    Frequency of Communication with Friends and Family: Three times a week    Frequency of Social Gatherings with Friends and Family: Once a week    Attends Religious Services: Never    Database administrator or Organizations: No    Attends Banker Meetings: Never    Marital Status: Divorced  Catering manager Violence: Patient Unable To Answer (11/24/2023)   Humiliation, Afraid, Rape, and Kick questionnaire    Fear of Current or Ex-Partner: Patient unable to answer    Emotionally Abused: Patient unable to answer    Physically Abused: Patient unable to answer    Sexually Abused: Patient unable to answer     Code Status   Code Status: Full Code  Review of Systems: All systems reviewed and negative except where noted in HPI.  Physical Exam: Vital signs in last 24 hours: Temp:  [97.4 F (36.3 C)-98.2 F (36.8 C)] 97.5 F (36.4 C) (08/07 1331) Pulse Rate:  [78-98] 98 (08/07 1331) Resp:  [14-18] 17 (08/07 1331) BP: (129-161)/(72-87) 129/72 (08/07 1331) SpO2:  [100 %] 100 % (08/07 1331) Weight:  [66 kg] 66 kg (08/06 2102)    General:  Pleasant thin male in NAD Psych:  Cooperative. Normal mood and affect Eyes: Pupils equal Ears:  Normal auditory acuity Nose: No deformity, discharge or lesions Neck:  Supple, no masses felt Lungs:  Clear to auscultation.  Heart:  Regular rate, regular rhythm.  Abdomen:  Soft,  nondistended, nontender, active bowel sounds, no masses felt Rectal :  Deferred Msk: Symmetrical without gross deformities.  Neurologic:  Alert, unable to tell me what month it is, has expressive aphagia Extremities : No edema Skin:  Intact without significant lesions.    Intake/Output from previous  day: 08/06 0701 - 08/07 0700 In: 598 [I.V.:100; Blood:498] Out: -  Intake/Output this shift:  No intake/output data recorded.   Vina Dasen, NP-C   11/24/2023, 1:48 PM

## 2023-11-25 ENCOUNTER — Encounter (HOSPITAL_COMMUNITY)
Admission: EM | Disposition: A | Discharge status: Home / Self Care | Payer: Self-pay | Source: Home / Self Care | Attending: Emergency Medicine

## 2023-11-25 ENCOUNTER — Telehealth: Payer: Self-pay | Admitting: *Deleted

## 2023-11-25 ENCOUNTER — Other Ambulatory Visit: Payer: Self-pay | Admitting: Nurse Practitioner

## 2023-11-25 DIAGNOSIS — K922 Gastrointestinal hemorrhage, unspecified: Secondary | ICD-10-CM | POA: Diagnosis not present

## 2023-11-25 DIAGNOSIS — T189XXA Foreign body of alimentary tract, part unspecified, initial encounter: Secondary | ICD-10-CM

## 2023-11-25 DIAGNOSIS — D696 Thrombocytopenia, unspecified: Secondary | ICD-10-CM | POA: Diagnosis not present

## 2023-11-25 DIAGNOSIS — D500 Iron deficiency anemia secondary to blood loss (chronic): Secondary | ICD-10-CM | POA: Diagnosis not present

## 2023-11-25 DIAGNOSIS — D509 Iron deficiency anemia, unspecified: Secondary | ICD-10-CM | POA: Diagnosis not present

## 2023-11-25 DIAGNOSIS — K5521 Angiodysplasia of colon with hemorrhage: Secondary | ICD-10-CM | POA: Diagnosis not present

## 2023-11-25 LAB — BASIC METABOLIC PANEL WITH GFR
Anion gap: 12 (ref 5–15)
BUN: 70 mg/dL — ABNORMAL HIGH (ref 8–23)
CO2: 21 mmol/L — ABNORMAL LOW (ref 22–32)
Calcium: 8.6 mg/dL — ABNORMAL LOW (ref 8.9–10.3)
Chloride: 107 mmol/L (ref 98–111)
Creatinine, Ser: 4.42 mg/dL — ABNORMAL HIGH (ref 0.61–1.24)
GFR, Estimated: 13 mL/min — ABNORMAL LOW (ref >60)
Glucose, Bld: 135 mg/dL — ABNORMAL HIGH (ref 70–99)
Potassium: 4.6 mmol/L (ref 3.5–5.1)
Sodium: 140 mmol/L (ref 135–145)

## 2023-11-25 LAB — CBC
HCT: 25.2 % — ABNORMAL LOW (ref 39.0–52.0)
Hemoglobin: 8.4 g/dL — ABNORMAL LOW (ref 13.0–17.0)
MCH: 30.7 pg (ref 26.0–34.0)
MCHC: 33.3 g/dL (ref 30.0–36.0)
MCV: 92 fL (ref 80.0–100.0)
Platelets: 128 K/uL — ABNORMAL LOW (ref 150–400)
RBC: 2.74 MIL/uL — ABNORMAL LOW (ref 4.22–5.81)
RDW: 14.9 % (ref 11.5–15.5)
WBC: 3.1 K/uL — ABNORMAL LOW (ref 4.0–10.5)
nRBC: 0 % (ref 0.0–0.2)

## 2023-11-25 SURGERY — IMAGING PROCEDURE, GI TRACT, INTRALUMINAL, VIA CAPSULE
Anesthesia: LOCAL

## 2023-11-25 MED ORDER — METOCLOPRAMIDE HCL 5 MG/ML IJ SOLN: 10.0000 mg | Freq: Four times a day (QID) | INTRAMUSCULAR | Status: AC

## 2023-11-25 SURGICAL SUPPLY — 1 items: TOWEL COTTON PACK 4EA (MISCELLANEOUS) ×2 IMPLANT

## 2023-11-25 NOTE — TOC Progression Note (Signed)
 Transition of Care [facility]) - Progression Note    Patient Details  Name: Gregory Nunez. MRN: [id number] Date of Birth: [birth year]  Transition of [facility]) CM/SW Contact  Anisia Leija A [place], LCSW Phone Number: 11/25/2023, 4:45 PM  Clinical Narrative:      CSW will start insurance authorization for pt to return to [facility] at [place] once closer to medical stability.  CSW will continue to follow.                Expected Discharge Plan and Services                                               Social Drivers of Health (SDOH) Interventions SDOH Screenings   Food Insecurity: Patient Unable To Answer (11/24/2023)  Housing: Low Risk  (11/24/2023)  Transportation Needs: Patient Unable To Answer (11/24/2023)  Utilities: Patient Unable To Answer (11/24/2023)  Alcohol Screen: Low Risk  (09/21/2023)  Depression (PHQ2-9): Low Risk  (11/03/2022)  Financial Resource Strain: Low Risk  (09/21/2023)  Physical Activity: Insufficiently Active (09/21/2023)  Social Connections: Patient Unable To Answer (11/24/2023)  Recent Concern: Social Connections - Socially Isolated (09/21/2023)  Stress: No Stress Concern Present (09/21/2023)  Tobacco Use: Medium Risk (11/23/2023)  Health Literacy: Adequate Health Literacy (11/03/2022)    Readmission Risk Interventions    10/17/2023    4:48 PM 06/20/2023    2:55 PM 06/15/2023    3:00 PM  Readmission Risk Prevention Plan  Post Dischage Appt   Complete  Medication Screening   Complete  Transportation Screening Complete Complete Complete  PCP or Specialist Appt within 5-7 Days  Complete   Home Care Screening  Complete   Medication Review (RN CM)  Complete

## 2023-11-25 NOTE — Progress Notes (Signed)
 HD#1 SUBJECTIVE:  Patient Summary: Gregory Nunez. is a 78 y.o. 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   Overnight Events: none  Interim History: Patient evaluated at bedside. He feels well this morning. He reports that he did not sleep very well but did not clearly state why. Denies difficulty with breathing, denies chest pain or abdominal pain, he denies any issues with urinating and has been using the toilet without issue. He does not think that he has had a BM in the past 24 hours. Spoke with nursing, who states the patient has been agitated, especially at night. He is refusing some of his medications and cursing out the nursing staff.   OBJECTIVE:  Vital Signs: Vitals:   11/24/23 1632 11/24/23 2029 11/25/23 0625 11/25/23 0818  BP: 134/77 126/86 (!) 155/86 139/87  Pulse: 87 92  89  Resp: 16 16  18   Temp: 98.5 F (36.9 C) 97.8 F (36.6 C)  (!) 97.5 F (36.4 C)  TempSrc:  Oral    SpO2: 100% 100%  100%  Weight:      Height:       Supplemental O2: Room Air SpO2: 100 %  Filed Weights   11/23/23 2102  Weight: 66 kg    No intake or output data in the 24 hours ending 11/25/23 1055  Net IO Since Admission: 598 mL [11/25/23 1055]  Physical Exam: Physical Exam Constitutional:      Appearance: Normal appearance.  Eyes:     Extraocular Movements: Extraocular movements intact.     Pupils: Pupils are equal, round, and reactive to light.  Cardiovascular:     Rate and Rhythm: Normal rate and regular rhythm.     Pulses: Normal pulses.  Pulmonary:     Effort: Pulmonary effort is normal.     Breath sounds: Normal breath sounds.  Abdominal:     General: Abdomen is flat. There is no distension.     Palpations: Abdomen is soft.     Tenderness: There is no  abdominal tenderness. There is no guarding.  Skin:    General: Skin is warm and dry.     Coloration: Skin is not pale.  Neurological:     Mental Status: He is alert. Mental status is at baseline.     Comments: Oriented to self  Psychiatric:        Mood and Affect: Mood normal.        Behavior: Behavior normal.     Patient Lines/Drains/Airways Status     Active Line/Drains/Airways     Name Placement date Placement time Site Days   Peripheral IV 11/24/23 22 G 1 Anterior;Right;Upper Arm 11/24/23  0412  Arm  less than 1            Pertinent labs and imaging:      Latest Ref Rng & Units 11/25/2023    1:59 AM 11/24/2023    7:50 AM 11/23/2023    9:15 PM  CBC  WBC 4.0 - 10.5 K/uL 3.1  3.3  3.1   Hemoglobin 13.0 - 17.0 g/dL 8.4  8.4  6.7   Hematocrit 39.0 - 52.0 % 25.2  25.0  21.1   Platelets 150 - 400 K/uL 128  121  120        Latest  Ref Rng & Units 11/25/2023    1:59 AM 11/24/2023    7:50 AM 11/23/2023    9:15 PM  CMP  Glucose 70 - 99 mg/dL 864  856  812   BUN 8 - 23 mg/dL 70  69  73   Creatinine 0.61 - 1.24 mg/dL 5.57  5.73  5.42   Sodium 135 - 145 mmol/L 140  136  139   Potassium 3.5 - 5.1 mmol/L 4.6  4.3  4.8   Chloride 98 - 111 mmol/L 107  106  107   CO2 22 - 32 mmol/L 21  20  21    Calcium  8.9 - 10.3 mg/dL 8.6  8.5  8.3   Total Protein 6.5 - 8.1 g/dL   7.6   Total Bilirubin 0.0 - 1.2 mg/dL   0.5   Alkaline Phos 38 - 126 U/L   56   AST 15 - 41 U/L   59   ALT 0 - 44 U/L   56     No results found.  ASSESSMENT/PLAN:  Assessment: Principal Problem:   GI bleeding  Gregory Nunez. is a 78 y.o. 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   Plan: #Chronic duodenal AVM #Acute on Chronic anemia d/t GI bleeding #Anemia of Chronic Disease Patient brought by  EMS with a hemoglobin of 6.7. At his SNF, his Hgb reportedly 6.3. He received 1 unit of packed red blood cells, and 40 mg of IV Protonix . In the ED dark stool was appreciated in the patient's briefs without brisk bleeding that appeared to be melena.  Hemodynamically stable with no pertinent exam findings on initial evaluation. Hgb 8.4 on repeat CBC post-transfusion. Stable today at 8.4. GI evaluated yesterday and will proceed with capsule endoscopy for today after half of colonoscopy prep. He may not be a candidate for bevacizumab  if he has persistent bleeding. Per nursing staff, the patient refused his bowel prep for capsule endoscopy. - Pending capsule endoscopy results - Trend CBC, monitor H&H daily - Transfuse if hemoglobin < 7 - IV Protonix  40 mg twice daily   #Mild Transaminitis #Chronic HCV AST 59 and ALT 56 slightly elevated from baseline on admission.  Seen previously by Dr. Eben on 11/23/22 for chronic HCV and treated with Mavyret  which was stopped at his previous hospitalization in February 2025.  Fib4 score is 4.99 so he would likely need GI follow-up for elastogram.  He was supposed to receive this in August 2024, but this was not completed.  He was found to have active hepatitis C while inpatient.  Discussed with Dr. Eben about restarting Mavyret .  Patient will need to start after discharge. - Restart Mavyret  at an outpatient setting   #History of Ischemic CVA with residual dysarthria #Vascular dementia Patient has a history of stroke with left-sided defects due to ischemic stroke on 09/21/23 with infarcts in the left thalamus and parietal white matter.  Appears to be at baseline mental status, which is A&O to self and situation.  Holding aspirin  given current GI bleed. -- Continue home atorvastatin  80 mg daily. - Hold home aspirin  81 mg  #Hypertension Blood pressure on admission is 151/85.  Blood pressures have been normotensive to slightly hypertensive while here. Continue home  medications. - Amlodipine  10 mg daily - Hydralazine   25 mg daily  #T2DM, controlled # A1c of 5.1% on 6/5.  Glucose elevated on admission at 187.  Not taking any antihyperglycemics currently. - Monitor, follow-up with PCP outpatient  #Leukopenia #Thrombocytopenia White blood cell 3.1 and platelets 120 on initial CBC.  Both appear to be chronic and stable.  Continue to monitor.  Patient refusing heparin  injection, has not received any while inpatient.  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 Creatinine around baseline at 4.57, stable.  Creatinine today 4.42. rinHepa for VTE due to renal dysfunction was ordered, but patient refused multiple times. - Continue to monitor   Best Practice: Diet: Regular diet IVF: Fluids: 0.9NS, Rate: 3mL/hr VTE: Place and maintain sequential compression device Start: 11/24/23 1634 Code: Full  Disposition planning: Therapy Recs: SNF, [facility] Family Contact: Nephew DISPO: Anticipated discharge in 1-2 days to Skilled nursing facility pending clinical improvement.    Signature:  Hezekiah Veltre [facility]  10:55 AM, 11/25/2023  On Call pager [phone]

## 2023-11-25 NOTE — Progress Notes (Signed)
 OT Cancellation Note  Patient Details Name: Gregory Nunez. MRN: [id number] DOB: [birth year]   Cancelled Treatment:    Reason Eval/Treat Not Completed: Patient declined, no reason specified  Kennth Mliss Helling 11/25/2023, 1:27 PM Mliss HERO, OTR/L Acute Rehabilitation Services Office: [phone]

## 2023-11-25 NOTE — Progress Notes (Signed)
 Givens capsule endoscopy ordered by MD Pyrtle.  Patient ingested capsule at 1000.  Per Given's capsule instructions, patient to remain NPO until 1200 at which time they may progress to clear liquid diet. At 1400 patient may have a small snack such as a half a sandwich or a bowl of soup. At 1800 patient may progress to previously ordered diet.  The capsule endoscopy study will conclude at 2200 at which time the recorder and leads or belt can be removed and placed in a patient belongings bag. Endoscopy staff will pick up the equipment in the AM.  Instructions provided to patient and inpatient RN. Patient and RN demonstrated understanding.

## 2023-11-25 NOTE — Plan of Care (Signed)

## 2023-11-25 NOTE — Evaluation (Signed)
 Physical Therapy Evaluation Patient Details Name: Gregory Nunez. MRN: [id number] DOB: 05-20-1945 Today's Date: 11/25/2023  History of Present Illness  78 yo male admitted with recurrent decline in hemoglobin. GIB. PMH:  iron  deficiency anemia, small bowel AVMs, colon polyps, CKD IV / V, DM, hypertension, alcohol abuse, thiamine  deficiency,  HCV, cholelithiasis , and CVA with residual dysarthria.  Clinical Impression  Pt admitted with above diagnosis. Pt self limiting and would not do more than get to EOB even with max encouragement.  Pt is confused. Pt will benefit from post acute rehab < 3 hours day.   Pt currently with functional limitations due to the deficits listed below (see PT Problem List). Pt will benefit from acute skilled PT to increase their independence and safety with mobility to allow discharge.           If plan is discharge home, recommend the following: Two people to help with walking and/or transfers;Two people to help with bathing/dressing/bathroom;Assistance with cooking/housework;Assist for transportation;Help with stairs or ramp for entrance;Supervision due to cognitive status   Can travel by private vehicle   No    Equipment Recommendations None recommended by PT  Recommendations for Other Services       Functional Status Assessment Patient has had a recent decline in their functional status and demonstrates the ability to make significant improvements in function in a reasonable and predictable amount of time.     Precautions / Restrictions Precautions Precautions: Fall Restrictions Weight Bearing Restrictions Per Provider Order: No      Mobility  Bed Mobility Overal bed mobility: Independent             General bed mobility comments: Pt came to EOB without assist.  Pt appeared agreeable however once at EOB, refused to stand and refused to get back in bed and just kept sitting on EOB. Pt with incr agitation and took incr time to get pt to lie  back down.    Transfers                   General transfer comment: Refused    Ambulation/Gait               General Gait Details: refused  Stairs            Wheelchair Mobility     Tilt Bed    Modified Rankin (Stroke Patients Only)       Balance Overall balance assessment: Needs assistance Sitting-balance support: No upper extremity supported, Feet supported Sitting balance-Leahy Scale: Good                                       Pertinent Vitals/Pain Pain Assessment Pain Assessment: No/denies pain    Home Living Family/patient expects to be discharged to:: Skilled nursing facility                   Additional Comments: No family present today. information taken from previous chart.    Prior Function Prior Level of Function : Patient poor historian/Family not available;History of Falls (last six months)             Mobility Comments: using cane PTA, has falls at home ADLs Comments: per chart, does not drive or cook; per chart pt has been at [facility] health care since at least June as it was documented on previous admit.  Pt could not  answer questions as he is confused.     Extremity/Trunk Assessment   Upper Extremity Assessment Upper Extremity Assessment: Defer to OT evaluation    Lower Extremity Assessment Lower Extremity Assessment: Difficult to assess due to impaired cognition    Cervical / Trunk Assessment Cervical / Trunk Assessment: Normal  Communication   Communication Communication: Impaired Factors Affecting Communication: Difficulty expressing self    Cognition Arousal: Alert Behavior During Therapy: Impulsive, Anxious, Restless, Agitated   PT - Cognitive impairments: Difficult to assess, Orientation, Awareness, Attention, Memory, Initiation, Sequencing, Problem solving, Safety/Judgement   Orientation impairments: Place, Time, Situation                     Following commands:  Impaired Following commands impaired: Follows one step commands inconsistently     Cueing Cueing Techniques: Verbal cues, Gestural cues, Tactile cues, Visual cues     General Comments General comments (skin integrity, edema, etc.): VSS    Exercises     Assessment/Plan    PT Assessment Patient needs continued PT services  PT Problem List Decreased activity tolerance;Decreased balance;Decreased knowledge of use of DME;Decreased safety awareness;Decreased knowledge of precautions       PT Treatment Interventions DME instruction;Gait training;Functional mobility training;Therapeutic activities;Therapeutic exercise;Balance training;Patient/family education    PT Goals (Current goals can be found in the Care Plan section)  Acute Rehab PT Goals Patient Stated Goal: to go back to [facility] care PT Goal Formulation: Patient unable to participate in goal setting Time For Goal Achievement: 12/09/23 Potential to Achieve Goals: Fair    Frequency Min 2X/week     Co-evaluation               AM-PAC PT 6 Clicks Mobility  Outcome Measure Help needed turning from your back to your side while in a flat bed without using bedrails?: None Help needed moving from lying on your back to sitting on the side of a flat bed without using bedrails?: None Help needed moving to and from a bed to a chair (including a wheelchair)?: A Lot Help needed standing up from a chair using your arms (e.g., wheelchair or bedside chair)?: A Lot Help needed to walk in hospital room?: Total Help needed climbing 3-5 steps with a railing? : Total 6 Click Score: 14    End of Session   Activity Tolerance:  (pt self limiting) Patient left: in bed;with call bell/phone within reach;with bed alarm set Nurse Communication: Mobility status;Need for lift equipment PT Visit Diagnosis: Muscle weakness (generalized) (M62.81)    Time: 9083-9059 PT Time Calculation (min) (ACUTE ONLY): 24 min   Charges:   PT  Evaluation $PT Eval Moderate Complexity: 1 Mod PT Treatments $Therapeutic Activity: 8-22 mins PT General Charges $$ ACUTE PT VISIT: 1 Visit         Gregory Nunez,PT Acute Rehab Services [phone]   Gregory Nunez 11/25/2023, 10:46 AM

## 2023-11-25 NOTE — Telephone Encounter (Signed)
 Message Received: Today Kerman Vina HERO, NP  Claudene Naomie SAILOR, RN  Would you please place an order in epic for a KUB to be done in 1 week under JMP's name? This needs to be done to ensure that the Givens capsule study passed .

## 2023-11-25 NOTE — NC FL2 (Signed)
 [place]  MEDICAID [id number] LEVEL OF CARE FORM     IDENTIFICATION  Patient Name: Gregory Nunez. Birthdate: Dec 02, 1945 Sex: male Admission Date (Current Location): 11/23/2023  [facility] and [place] Number:  Producer, television/film/video and Address:  [facility], [address], [place], [place] [address]      Provider Number: [id number]  Attending Physician Name and Address:  Francesco Elsie NOVAK, MD  Relative Name and Phone Number:  Ang[place], [facility])  [phone]    Current Level of Care: Hospital Recommended Level of Care: Skilled Nursing Facility Prior Approval Number:    Date Approved/Denied:   PASRR Number: [id number]  Discharge Plan: SNF    Current Diagnoses: Patient Active Problem List   Diagnosis Date Noted   GI bleeding 11/23/2023   Blood loss anemia 10/18/2023   Acute on chronic anemia 10/15/2023   Protein-calorie malnutrition (HCC) 10/14/2023   Thiamine  deficiency 10/14/2023   Antiplatelet or antithrombotic long-term use 10/14/2023   Upper GI bleeding 10/14/2023   Recent cerebrovascular accident (CVA) 10/14/2023   Gastritis and gastroduodenitis 10/03/2023   Duodenal erosion 10/03/2023   AVM (arteriovenous malformation) of small bowel, 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   GI bleed 10/01/2023   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   Cerebrovascular accident (CVA) (HCC) 09/22/2023   Encephalopathy 09/22/2023   Aphasia 09/22/2023   Neutropenia (HCC) 09/22/2023   Stage 4 chronic kidney disease (HCC) 09/22/2023   Essential hypertension 09/22/2023   Confusion with non-focal neuro exam 08/17/2023   Acute on chronic blood loss anemia 06/13/2023   Chronic active hepatitis C (HCC) 11/03/2022   History of epistaxis 12/04/2021   Debility 04/15/2021   History of alcohol abuse 04/13/2021   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/12/2021   Weakness of both legs    Adenomatous duodenal polyp    AVM (arteriovenous malformation) of duodenum,  acquired with hemorrhage    Angiodysplasia of intestine    Iron  deficiency anemia 05/22/2020   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 (HCC)    History of CVA (cerebrovascular accident) 04/23/2020   Hyperlipidemia associated with type 2 diabetes mellitus (HCC) 04/23/2020   CKD (chronic kidney disease) stage 4, GFR 15-29 ml/min (HCC) 04/23/2020   Tobacco use 04/23/2020   Seizure disorder (HCC) 11/08/2018   Hypertension associated with diabetes (HCC) 11/08/2018   Type 2 diabetes with complication (HCC) 11/08/2018   Prostate cancer (HCC) 02/27/2014    Orientation RESPIRATION BLADDER Height & Weight     Self  Normal Continent Weight: 145 lb 8.1 oz (66 kg) Height:  6' (182.9 cm)  BEHAVIORAL SYMPTOMS/MOOD NEUROLOGICAL BOWEL NUTRITION STATUS      Continent Diet (see DC summary)  AMBULATORY STATUS COMMUNICATION OF NEEDS Skin   Extensive Assist Verbally Normal                       Personal Care Assistance Level of Assistance  Bathing, Feeding, Dressing Bathing Assistance: Maximum assistance Feeding assistance: Limited assistance Dressing Assistance: Maximum assistance     Functional Limitations Info  Sight, Hearing, Speech Sight Info: Adequate Hearing Info: Adequate Speech Info: Adequate    SPECIAL CARE FACTORS FREQUENCY  PT (By licensed PT), OT (By licensed OT)     PT Frequency: 5x/week OT Frequency: 5x/week            Contractures Contractures Info: Not present    Additional Factors Info  Code Status, Allergies Code Status Info: FULL Allergies Info: Penicillins  Current Medications (11/25/2023):  This is the current hospital active medication list Current Facility-Administered Medications  Medication Dose Route Frequency Provider Last Rate Last Admin   0.9 %  sodium chloride  infusion (Manually program via Guardrails IV Fluids)   Intravenous Once Mannie Pac T, DO       amLODipine  (NORVASC ) tablet 10 mg  10 mg Oral Daily  Nooruddin, Saad, MD   10 mg at 11/24/23 9062   atorvastatin  (LIPITOR ) tablet 80 mg  80 mg Oral QPM Nooruddin, Saad, MD   80 mg at 11/24/23 1819   hydrALAZINE  (APRESOLINE ) tablet 25 mg  25 mg Oral Q8H Nooruddin, Saad, MD   25 mg at 11/25/23 1327   melatonin tablet 3 mg  3 mg Oral QHS PRN Tan, Dawson, MD   3 mg at 11/24/23 2130   metoCLOPramide  (REGLAN ) injection 10 mg  10 mg Intravenous Q6H Pyrtle, Gordy HERO, MD       pantoprazole  (PROTONIX ) EC tablet 40 mg  40 mg Oral BID Jolaine Pac, DO   40 mg at 11/24/23 2130   polyethylene glycol powder (GLYCOLAX /MIRALAX ) container 119 g  119 g Oral Once Guenther, Paula M, NP         Discharge Medications: Please see discharge summary for a list of discharge medications.  Relevant Imaging Results:  Relevant Lab Results:   Additional Information SSN: [id number]  Rosa Wyly A [place], LCSW

## 2023-11-25 NOTE — Progress Notes (Signed)
 Daily Progress Note  DOA: 11/23/2023 Hospital Day: 3   ASSESSMENT    Patient Profile:  78 yo male with a history of iron  deficiency anemia, small bowel AVMs, colon polyps, CKD IV / V, DM, hypertension, alcohol abuse, thiamine  deficiency,  HCV, cholelithiasis , and CVA with residual dysarthria.  Admitted with recurrent decline in hemoglobin   Recurrent acute on chronic anemia AVM disease Decline in hemoglobin probably related to bleeding AVMs.  He was started on subcu octreotide  at the time of last hospital discharge in late June but unable to confirm whether or not he received it.   TODAY>> Hgb remains stable at 8.4 post transfusion on 8/6   CKD5   Chronic, thrombocytopenia No findings of cirrhosis on prior ultrasounds though there is a history of alcohol abuse.  INR's have been normal. Hasn't had abdominal imaging so unknown if spleen enlarged.  Yes   History of colon polyps Two subcentimeter tubular adenomas removed in February 2022   History of ischemic CVA with residual dysarthria Based on outpatient med list he apparently no longer takes Plavix , aspirin  only    Mild transaminitis  HCV Treated with Mavyret  ( ? Completed).  HCV quantitative was 21K in June.     PLAN   --Patient did not take his partial bowel prep for the video capsule study last night.  Furthermore, when I went into the room to see him today the capsule equipment was laying on the bed, patient took it off himself he says.   -- GI signing off.  Patient needs a KUB in 1 week to ensure passage of video capsule.  We can do this at our [facility] office on [address] ( Radiology in the basement) if he is willing / able to show up. Should go sometime between 9 am and 4 pm. I will put order in Epic.    Subjective   No complaints other than just doesn't feel well. No specific symptoms   Objective   GI Studies:    Recent Labs    11/23/23 2115 11/24/23 0750 11/25/23 0159  WBC 3.1* 3.3*  3.1*  HGB 6.7* 8.4* 8.4*  HCT 21.1* 25.0* 25.2*  MCV 97.2 92.3 92.0  PLT 120* 121* 128*   No results for input(s): FOLATE, VITAMINB12, FERRITIN, TIBC, IRONPCTSAT in the last 72 hours. Recent Labs    11/23/23 2115 11/24/23 0750 11/25/23 0159  NA 139 136 140  K 4.8 4.3 4.6  CL 107 106 107  CO2 21* 20* 21*  GLUCOSE 187* 143* 135*  BUN 73* 69* 70*  CREATININE 4.57* 4.26* 4.42*  CALCIUM  8.3* 8.5* 8.6*   Recent Labs    11/23/23 2115  PROT 7.6  ALBUMIN 3.1*  AST 59*  ALT 56*  ALKPHOS 56  BILITOT 0.5      Imaging:  VAS US  CAROTID Carotid Arterial Duplex Study  Patient Name:  Gregory Nunez.  Date of Exam:   09/23/2023 Medical Rec #: [id number]                Accession #:    [id number] Date of Birth: 1945-06-12                Patient Gender: M Patient Age:   78 years Exam Location:  [facility] Procedure:      VAS US  CAROTID Referring Phys: PRAMOD SETHI  --------------------------------------------------------------------------------   Indications:       CVA. Risk Factors:  Hypertension, hyperlipidemia, Diabetes, current smoker, prior                    CVA. Other Factors:     CKD,. Comparison Study:  No previous exams  Performing Technologist: Jody Hill RVT, RDMS    Examination Guidelines: A complete evaluation includes B-mode imaging, spectral Doppler, color Doppler, and power Doppler as needed of all accessible portions of each vessel. Bilateral testing is considered an integral part of a complete examination. Limited examinations for reoccurring indications may be performed as noted.    Right Carotid Findings: +----------+--------+--------+--------+------------------+------------------+           PSV cm/sEDV cm/sStenosisPlaque DescriptionComments           +----------+--------+--------+--------+------------------+------------------+ CCA Prox  108     10                                                    +----------+--------+--------+--------+------------------+------------------+ CCA Distal78      11                                intimal thickening +----------+--------+--------+--------+------------------+------------------+ ICA Prox  42      11              focal and calcific                   +----------+--------+--------+--------+------------------+------------------+ ICA Distal37      10                                                   +----------+--------+--------+--------+------------------+------------------+ ECA       80      0                                                    +----------+--------+--------+--------+------------------+------------------+  +----------+--------+-------+----------------+-------------------+           PSV cm/sEDV cmsDescribe        Arm Pressure (mmHG) +----------+--------+-------+----------------+-------------------+ [id number]            Multiphasic, WNL                    +----------+--------+-------+----------------+-------------------+  +---------+--------+--+--------+-+---------+ VertebralPSV cm/s46EDV cm/s9Antegrade +---------+--------+--+--------+-+---------+     Left Carotid Findings: +----------+--------+--------+--------+---------------------+------------------+           PSV cm/sEDV cm/sStenosisPlaque Description   Comments           +----------+--------+--------+--------+---------------------+------------------+ CCA Prox  111     14                                                      +----------+--------+--------+--------+---------------------+------------------+ CCA Distal86      11  intimal thickening +----------+--------+--------+--------+---------------------+------------------+ ICA Prox  32      9               focal and                                                                 heterogenous                             +----------+--------+--------+--------+---------------------+------------------+ ICA Distal29      9                                                       +----------+--------+--------+--------+---------------------+------------------+ ECA       70      0                                                       +----------+--------+--------+--------+---------------------+------------------+  +----------+--------+--------+----------------+-------------------+           PSV cm/sEDV cm/sDescribe        Arm Pressure (mmHG) +----------+--------+--------+----------------+-------------------+ Dlarojcpjw04              Multiphasic, WNL                    +----------+--------+--------+----------------+-------------------+  +---------+--------+--+--------+-+---------+ VertebralPSV cm/s28EDV cm/s6Antegrade +---------+--------+--+--------+-+---------+        Summary: Right Carotid: The extracranial vessels were near-normal with only minimal wall                thickening or plaque.  Left Carotid: The extracranial vessels were near-normal with only minimal wall               thickening or plaque.  Vertebrals:  Bilateral vertebral arteries demonstrate antegrade flow. Subclavians: Normal flow hemodynamics were seen in bilateral subclavian              arteries.  *See table(s) above for measurements and observations.    Electronically signed by Eather Popp MD on 09/26/2023 at 8:00:57 AM.      Final       Scheduled inpatient medications:   sodium chloride    Intravenous Once   amLODipine   10 mg Oral Daily   atorvastatin   80 mg Oral QPM   hydrALAZINE   25 mg Oral Q8H   metoCLOPramide  (REGLAN ) injection  10 mg Intravenous Q6H   pantoprazole   40 mg Oral BID   polyethylene glycol powder  119 g Oral Once   Continuous inpatient infusions:  PRN inpatient medications: melatonin  Vital signs in last 24 hours: Temp:  [97.5 F (36.4 C)-98.5 F  (36.9 C)] 98 F (36.7 C) (08/08 1207) Pulse Rate:  [82-92] 82 (08/08 1207) Resp:  [16-18] 16 (08/08 1207) BP: (126-155)/(77-87) 145/87 (08/08 1207) SpO2:  [100 %] 100 % (08/08 1207)   No intake or output data in the 24 hours ending 11/25/23 1417  Intake/Output from previous day: No intake/output data recorded.  Intake/Output this shift: No intake/output data recorded.   Physical Exam:  General: Alert thin male in NAD, Keeps covers pulled over his head.  Heart:  Regular rate and rhythm.  Pulmonary: Normal respiratory effort Abdomen: Soft, nondistended, nontender. Normal bowel sounds. Neurologic: Alert and oriented Psych: Pleasant. Cooperative     LOS: 0 days   Vina Dasen ,NP 11/25/2023, 2:17 PM

## 2023-11-25 NOTE — Telephone Encounter (Signed)
 Order for KUB entered in EPIC to be completed 12/02/23.

## 2023-11-26 DIAGNOSIS — K922 Gastrointestinal hemorrhage, unspecified: Secondary | ICD-10-CM | POA: Diagnosis not present

## 2023-11-26 LAB — CBC
HCT: 26.4 % — ABNORMAL LOW (ref 39.0–52.0)
Hemoglobin: 8.9 g/dL — ABNORMAL LOW (ref 13.0–17.0)
MCH: 31.1 pg (ref 26.0–34.0)
MCHC: 33.7 g/dL (ref 30.0–36.0)
MCV: 92.3 fL (ref 80.0–100.0)
Platelets: 142 K/uL — ABNORMAL LOW (ref 150–400)
RBC: 2.86 MIL/uL — ABNORMAL LOW (ref 4.22–5.81)
RDW: 14.6 % (ref 11.5–15.5)
WBC: 3.1 K/uL — ABNORMAL LOW (ref 4.0–10.5)
nRBC: 0 % (ref 0.0–0.2)

## 2023-11-26 LAB — MRSA NEXT GEN BY PCR, NASAL: MRSA by PCR Next Gen: NOT DETECTED

## 2023-11-26 NOTE — Progress Notes (Signed)
 HD#1 SUBJECTIVE:  Patient Summary: Gregory Kloos. is a 78 y.o. 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   Overnight Events: none  Interim History:  Patient is doing well this morning and reports good sleep.  Denies any new or worsening symptoms including abdominal pain, 4 from her bowel movements, nausea, or vomiting.  He does appear at baseline which is alert and oriented to person and place but not fully situation or time.  OBJECTIVE:  Vital Signs: Vitals:   11/25/23 0818 11/25/23 1207 11/25/23 2000 11/26/23 0459  BP: 139/87 (!) 145/87 131/77 (!) 147/90  Pulse: 89 82 70 82  Resp: 18 16 17 18   Temp: (!) 97.5 F (36.4 C) 98 F (36.7 C) 98.2 F (36.8 C) 98.6 F (37 C)  TempSrc:   Oral Oral  SpO2: 100% 100% 97% 100%  Weight:      Height:       Supplemental O2: Room Air SpO2: 100 %  Filed Weights   11/23/23 2102  Weight: 66 kg     Intake/Output Summary (Last 24 hours) at 11/26/2023 9386 Last data filed at 11/25/2023 2000 Gross per 24 hour  Intake 240 ml  Output --  Net 240 ml    Net IO Since Admission: 838 mL [11/26/23 0613]  Physical Exam: Constitutional: Comfortable appearing elderly male laying in bed. In no acute distress. Cardio:Regular rate and rhythm. Pulm:Normal work of breathing on room air. Abdomen: Soft, non-tender, non-distended FDX:Wzhjupcz for extremity edema. Skin:Warm and dry. Neuro: Alert and oriented to person and place but not situation and time.  moves all 4 extremities independently.  Patient Lines/Drains/Airways Status     Active Line/Drains/Airways     Name Placement date Placement time Site Days   Peripheral IV 11/24/23 22 G 1 Anterior;Right;Upper Arm 11/24/23  0412  Arm  less than 1            Pertinent labs and  imaging:      Latest Ref Rng & Units 11/26/2023    1:58 AM 11/25/2023    1:59 AM 11/24/2023    7:50 AM  CBC  WBC 4.0 - 10.5 K/uL 3.1  3.1  3.3   Hemoglobin 13.0 - 17.0 g/dL 8.9  8.4  8.4   Hematocrit 39.0 - 52.0 % 26.4  25.2  25.0   Platelets 150 - 400 K/uL 142  128  121        Latest Ref Rng & Units 11/25/2023    1:59 AM 11/24/2023    7:50 AM 11/23/2023    9:15 PM  CMP  Glucose 70 - 99 mg/dL 864  856  812   BUN 8 - 23 mg/dL 70  69  73   Creatinine 0.61 - 1.24 mg/dL 5.57  5.73  5.42   Sodium 135 - 145 mmol/L 140  136  139   Potassium 3.5 - 5.1 mmol/L 4.6  4.3  4.8   Chloride 98 - 111 mmol/L 107  106  107   CO2 22 - 32 mmol/L 21  20  21    Calcium  8.9 - 10.3 mg/dL 8.6  8.5  8.3   Total Protein 6.5 - 8.1 g/dL   7.6   Total Bilirubin 0.0 - 1.2 mg/dL   0.5   Alkaline Phos 38 -  126 U/L   56   AST 15 - 41 U/L   59   ALT 0 - 44 U/L   56     No results found.  ASSESSMENT/PLAN:  Assessment: Principal Problem:   GI bleeding  Gregory Bartell. is a 78 y.o. 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   Plan: #Acute on Chronic anemia d/t GI bleeding #Chronic duodenal AVM #Anemia of Chronic Disease Hemoglobin remained stable at 8.9 this morning.  No signs of active bleeding.  He swallowed pill camera yesterday and then did not want to wear the pill camera device.  GI plan for repeat KUB in 1 week to ensure pill camera passes.  At past admission was prescribed subcutaneous octreotide  every 4 weeks and it is unclear if this needs to be continued or if it was given in the first place.  This will be clarified with GI prior to discharge.  We also held his aspirin  on admission due to his bleeding.  He has had multiple GI bleeds over the last several months and I am unsure if we are going to  get valuable data from this pill endoscopy due to the lack of prep.  I do not think we will be able to have a risk/benefit discussion about resuming aspirin  with the patient but I will discuss with one of his family members today. - Monitor for any dark or red bowel movements  #Mild Transaminitis #Chronic HCV Appears to have been on treatment from August 2024-February 2025.  Hepatitis C quant elevated on 10/01/2023.  Previously on Mavyret  and will plan to resume this at discharge with plans to treat for 8 weeks with follow-up confirmatory testing.   #History of Ischemic CVA with residual dysarthria #Vascular dementia Patient has a history of stroke with left-sided defects due to ischemic stroke on 09/21/23 with infarcts in the left thalamus and parietal white matter.  Appears to be at baseline mental status. Holding aspirin  given recurrent GI bleed, will discuss with family member today -- Continue home atorvastatin  80 mg daily. - Hold home aspirin  81 mg  #Hypertension - Amlodipine  10 mg daily - Hydralazine  25 mg daily  #T2DM, controlled # A1c of 5.1% on 6/5.  Glucose elevated on admission at 187.  Not taking any antihyperglycemics currently. - Monitor, follow-up with PCP outpatient  #Leukopenia #Thrombocytopenia White blood cell 3.1 and platelets 120 on initial CBC.  Both appear to be chronic and stable.  Could be related to the phenol injection and/or chronic hep C.   #CKD5 Creatinine around baseline at 4.57, stable.  Best Practice: Diet: Regular diet IVF: Fluids: 0.9NS, Rate: 22mL/hr VTE: Place and maintain sequential compression device Start: 11/24/23 1634 Code: Full  Disposition planning: Therapy Recs: SNF, [facility] Family Contact: Nephew DISPO: Anticipated discharge in 0-1 days to Skilled nursing facility pending clinical improvement.  Patient is medically stable for discharge.    Signature: Fairy Pool, DO Internal Medicine Resident, PGY-3 Please  contact the on call pager at [phone] for any urgent or emergent needs. 6:19 AM 11/26/2023

## 2023-11-26 NOTE — Evaluation (Signed)
 Occupational Therapy Evaluation Patient Details Name: Gregory Nunez. MRN: [id number] DOB: 25-Apr-1945 Today's Date: 11/26/2023   History of Present Illness   78 yo male admitted with recurrent decline in hemoglobin. GIB. PMH:  iron  deficiency anemia, small bowel AVMs, colon polyps, CKD IV / V, DM, hypertension, alcohol abuse, thiamine  deficiency,  HCV, cholelithiasis , and CVA with residual dysarthria.     Clinical Impressions Pt resting in bed comfortably, no complaints. Pt from LTC, plans to return to LTC, unsure of PLOF, limited eval today due to agitation when asked to perform OOB activities. Pt demos ability to perform bed mobility without physical assist, sitting EOB and rolling but did not want to perform bedside ADLs or upright activities. Pt was able to follow commands with increased time and answer questions about PLOF, unsure of accuracy, not oriented to date. Pt would benefit from continued acute OT to maximize participation in ADLs and OOB activities, DC back to postacute rehab recommended.      If plan is discharge home, recommend the following:   A little help with walking and/or transfers;A little help with bathing/dressing/bathroom;Assistance with cooking/housework;Help with stairs or ramp for entrance;Assist for transportation;Supervision due to cognitive status;Direct supervision/assist for medications management;Direct supervision/assist for financial management     Functional Status Assessment   Patient has had a recent decline in their functional status and demonstrates the ability to make significant improvements in function in a reasonable and predictable amount of time.     Equipment Recommendations   None recommended by OT     Recommendations for Other Services         Precautions/Restrictions   Precautions Precautions: Fall Recall of Precautions/Restrictions: Impaired Restrictions Weight Bearing Restrictions Per Provider Order: No      Mobility Bed Mobility Overal bed mobility: Modified Independent             General bed mobility comments: able to roll and get to EOB without assist, refuses further activities    Transfers                   General transfer comment: refused      Balance Overall balance assessment: Needs assistance Sitting-balance support: No upper extremity supported, Feet supported Sitting balance-Leahy Scale: Good                                     ADL either performed or assessed with clinical judgement   ADL Overall ADL's : Needs assistance/impaired Eating/Feeding: Set up;Bed level               Upper Body Dressing : Set up;Supervision/safety;Cueing for sequencing;Standing                     General ADL Comments: limited due to decreased cognition and agitation, refused to perform OOB activities or attempt certain ADLs     Vision Ability to See in Adequate Light: 2 Moderately impaired Patient Visual Report: Blurring of vision       Perception         Praxis         Pertinent Vitals/Pain Pain Assessment Pain Assessment: No/denies pain     Extremity/Trunk Assessment Upper Extremity Assessment Upper Extremity Assessment: Difficult to assess due to impaired cognition           Communication Communication Communication: Impaired Factors Affecting Communication: Difficulty expressing self;Reduced clarity of speech  Cognition Arousal: Alert Behavior During Therapy: Flat affect, Agitated Cognition: History of cognitive impairments             OT - Cognition Comments: history of dementia, agitated when asked to perform OOB activities or ADLs                 Following commands: Impaired Following commands impaired: Follows one step commands with increased time     Cueing  General Comments   Cueing Techniques: Verbal cues;Gestural cues      Exercises     Shoulder Instructions      Home Living  Family/patient expects to be discharged to:: Skilled nursing facility                                 Additional Comments: No family present today. information taken from previous chart.      Prior Functioning/Environment Prior Level of Function : Patient poor historian/Family not available;History of Falls (last six months)             Mobility Comments: Pt reports using cane ADLs Comments: reports ind    OT Problem List: Decreased strength;Decreased activity tolerance;Decreased safety awareness   OT Treatment/Interventions: Self-care/ADL training;Therapeutic exercise;Energy conservation;DME and/or AE instruction;Therapeutic activities;Patient/family education;Balance training      OT Goals(Current goals can be found in the care plan section)   Acute Rehab OT Goals Patient Stated Goal: to return home OT Goal Formulation: With patient Time For Goal Achievement: 12/10/23 Potential to Achieve Goals: Good   OT Frequency:  Min 2X/week    Co-evaluation              AM-PAC OT 6 Clicks Daily Activity     Outcome Measure Help from another person eating meals?: A Little Help from another person taking care of personal grooming?: A Little Help from another person toileting, which includes using toliet, bedpan, or urinal?: A Little Help from another person bathing (including washing, rinsing, drying)?: A Lot Help from another person to put on and taking off regular upper body clothing?: A Little Help from another person to put on and taking off regular lower body clothing?: A Lot 6 Click Score: 16   End of Session Nurse Communication: Mobility status  Activity Tolerance: Treatment limited secondary to agitation Patient left: in bed;with call bell/phone within reach  OT Visit Diagnosis: Unsteadiness on feet (R26.81);Other abnormalities of gait and mobility (R26.89);Muscle weakness (generalized) (M62.81);Other symptoms and signs involving cognitive function                 Time: 8476-8466 OT Time Calculation (min): 10 min Charges:  OT General Charges $OT Visit: 1 Visit OT Evaluation $OT Eval Low Complexity: 1 Low  [address] OTR/L   Elouise JONELLE Bott 11/26/2023, 3:37 PM

## 2023-11-26 NOTE — TOC Progression Note (Signed)
 Transition of Care [facility]) - Progression Note    Patient Details  Name: Gregory Nunez. MRN: [id number] Date of Birth: [birth year]  [facility] CM/SW Contact  Gwenn Frieze [place], [place] Phone Number: 11/26/2023, 10:16 AM  Clinical Narrative: Per MD, pt medically stable for dc to SNF today. Home and Community/[employer] auth request has been submitted for [facility], reference [id number]. MD updated re barrier to dc.   Frieze Gwenn, MSW, LCSW [phone] (coverage)      Expected Discharge Plan: Skilled Nursing Facility Barriers to Discharge: Insurance Authorization               Expected Discharge Plan and Services     Post Acute Care Choice: Skilled Nursing Facility Living arrangements for the past 2 months: Skilled Nursing Facility                                       Social Drivers of Health (SDOH) Interventions SDOH Screenings   Food Insecurity: Patient Unable To Answer (11/24/2023)  Housing: Low Risk  (11/24/2023)  Transportation Needs: Patient Unable To Answer (11/24/2023)  Utilities: Patient Unable To Answer (11/24/2023)  Alcohol Screen: Low Risk  (09/21/2023)  Depression (PHQ2-9): Low Risk  (11/03/2022)  Financial Resource Strain: Low Risk  (09/21/2023)  Physical Activity: Insufficiently Active (09/21/2023)  Social Connections: Patient Unable To Answer (11/24/2023)  Recent Concern: Social Connections - Socially Isolated (09/21/2023)  Stress: No Stress Concern Present (09/21/2023)  Tobacco Use: Medium Risk (11/23/2023)  Health Literacy: Adequate Health Literacy (11/03/2022)    Readmission Risk Interventions    10/17/2023    4:48 PM 06/20/2023    2:55 PM 06/15/2023    3:00 PM  Readmission Risk Prevention Plan  Post Dischage Appt   Complete  Medication Screening   Complete  Transportation Screening Complete Complete Complete  PCP or Specialist Appt within 5-7 Days  Complete   Home Care Screening  Complete   Medication Review (RN CM)   Complete

## 2023-11-27 ENCOUNTER — Encounter (HOSPITAL_COMMUNITY): Payer: Self-pay | Admitting: Internal Medicine

## 2023-11-27 DIAGNOSIS — D509 Iron deficiency anemia, unspecified: Secondary | ICD-10-CM | POA: Diagnosis not present

## 2023-11-27 DIAGNOSIS — K922 Gastrointestinal hemorrhage, unspecified: Secondary | ICD-10-CM | POA: Diagnosis not present

## 2023-11-27 DIAGNOSIS — F0150 Vascular dementia without behavioral disturbance: Secondary | ICD-10-CM

## 2023-11-27 DIAGNOSIS — E1122 Type 2 diabetes mellitus with diabetic chronic kidney disease: Secondary | ICD-10-CM

## 2023-11-27 DIAGNOSIS — B182 Chronic viral hepatitis C: Secondary | ICD-10-CM

## 2023-11-27 DIAGNOSIS — I69322 Dysarthria following cerebral infarction: Secondary | ICD-10-CM

## 2023-11-27 DIAGNOSIS — Z79899 Other long term (current) drug therapy: Secondary | ICD-10-CM

## 2023-11-27 DIAGNOSIS — R7401 Elevation of levels of liver transaminase levels: Secondary | ICD-10-CM

## 2023-11-27 DIAGNOSIS — I1311 Hypertensive heart and chronic kidney disease without heart failure, with stage 5 chronic kidney disease, or end stage renal disease: Secondary | ICD-10-CM | POA: Diagnosis not present

## 2023-11-27 DIAGNOSIS — D631 Anemia in chronic kidney disease: Secondary | ICD-10-CM

## 2023-11-27 DIAGNOSIS — N185 Chronic kidney disease, stage 5: Secondary | ICD-10-CM

## 2023-11-27 LAB — RENAL FUNCTION PANEL
Albumin: 3.5 g/dL (ref 3.5–5.0)
Anion gap: 12 (ref 5–15)
BUN: 71 mg/dL — ABNORMAL HIGH (ref 8–23)
CO2: 20 mmol/L — ABNORMAL LOW (ref 22–32)
Calcium: 8.9 mg/dL (ref 8.9–10.3)
Chloride: 106 mmol/L (ref 98–111)
Creatinine, Ser: 4.82 mg/dL — ABNORMAL HIGH (ref 0.61–1.24)
GFR, Estimated: 12 mL/min — ABNORMAL LOW (ref >60)
Glucose, Bld: 108 mg/dL — ABNORMAL HIGH (ref 70–99)
Phosphorus: 5.9 mg/dL — ABNORMAL HIGH (ref 2.5–4.6)
Potassium: 4.4 mmol/L (ref 3.5–5.1)
Sodium: 138 mmol/L (ref 135–145)

## 2023-11-27 LAB — CBC
HCT: 28.3 % — ABNORMAL LOW (ref 39.0–52.0)
Hemoglobin: 9.5 g/dL — ABNORMAL LOW (ref 13.0–17.0)
MCH: 30.3 pg (ref 26.0–34.0)
MCHC: 33.6 g/dL (ref 30.0–36.0)
MCV: 90.1 fL (ref 80.0–100.0)
Platelets: 157 K/uL (ref 150–400)
RBC: 3.14 MIL/uL — ABNORMAL LOW (ref 4.22–5.81)
RDW: 14.4 % (ref 11.5–15.5)
WBC: 3.1 K/uL — ABNORMAL LOW (ref 4.0–10.5)
nRBC: 0 % (ref 0.0–0.2)

## 2023-11-27 NOTE — TOC Progression Note (Signed)
 Transition of Care [facility]) - Progression Note    Patient Details  Name: Gregory Nunez. MRN: [id number] Date of Birth: 07/12/1945  Transition of Care [facility]) CM/SW Contact  Gwenn Frieze [place], [place] Phone Number: 11/27/2023, 12:28 PM  Clinical Narrative: Per [facility]/[employer], auth for [facility] remains pending at this time and has been sent for med review. No additional clinicals or information being requested. MD updated re barrier to dc.     Frieze Gwenn, MSW, LCSW [phone] (coverage)        Expected Discharge Plan: Skilled Nursing Facility Barriers to Discharge: Insurance Authorization               Expected Discharge Plan and Services     Post Acute Care Choice: Skilled Nursing Facility Living arrangements for the past 2 months: Skilled Nursing Facility                                       Social Drivers of Health (SDOH) Interventions SDOH Screenings   Food Insecurity: Patient Unable To Answer (11/24/2023)  Housing: Low Risk  (11/24/2023)  Transportation Needs: Patient Unable To Answer (11/24/2023)  Utilities: Patient Unable To Answer (11/24/2023)  Alcohol Screen: Low Risk  (09/21/2023)  Depression (PHQ2-9): Low Risk  (11/03/2022)  Financial Resource Strain: Low Risk  (09/21/2023)  Physical Activity: Insufficiently Active (09/21/2023)  Social Connections: Patient Unable To Answer (11/24/2023)  Recent Concern: Social Connections - Socially Isolated (09/21/2023)  Stress: No Stress Concern Present (09/21/2023)  Tobacco Use: Medium Risk (11/23/2023)  Health Literacy: Adequate Health Literacy (11/03/2022)    Readmission Risk Interventions    10/17/2023    4:48 PM 06/20/2023    2:55 PM 06/15/2023    3:00 PM  Readmission Risk Prevention Plan  Post Dischage Appt   Complete  Medication Screening   Complete  Transportation Screening Complete Complete Complete  PCP or Specialist Appt within 5-7 Days  Complete   Home Care Screening  Complete    Medication Review (RN CM)  Complete

## 2023-11-27 NOTE — Plan of Care (Signed)
  Problem: Education: Goal: Knowledge of General Education information will improve Description: Including pain rating scale, medication(s)/side effects and non-pharmacologic comfort measures Outcome: Progressing   Problem: Clinical Measurements: Goal: Will remain free from infection Outcome: Progressing   Problem: Nutrition: Goal: Adequate nutrition will be maintained Outcome: Progressing   Problem: Pain Managment: Goal: General experience of comfort will improve and/or be controlled Outcome: Progressing   Problem: Safety: Goal: Ability to remain free from injury will improve Outcome: Progressing Note: Bed alarm is on. Continuously.    Problem: Skin Integrity: Goal: Risk for impaired skin integrity will decrease Outcome: Progressing

## 2023-11-27 NOTE — Progress Notes (Signed)
 HD#1 SUBJECTIVE:  Patient Summary: Gregory Moss. is a 78 y.o. 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   Overnight Events: none  Interim History:  Doing well this morning without any new or worsening symptoms.  He is at baseline mental status which is alert and oriented to person, place, +/- situation, but not time.  He wants to go home and knows that he came from [facility] healthcare SNF.  OBJECTIVE:  Vital Signs: Vitals:   11/26/23 1615 11/26/23 2015 11/27/23 0000 11/27/23 0434  BP: 135/74 127/81 132/77 (!) 157/80  Pulse: 86 92 89 86  Resp: 16 18 17 18   Temp: 97.6 F (36.4 C)  98 F (36.7 C) 98.3 F (36.8 C)  TempSrc:      SpO2: 100% 99% 100% 100%  Weight:      Height:       Supplemental O2: Room Air SpO2: 100 %  Filed Weights   11/23/23 2102  Weight: 66 kg    No intake or output data in the 24 hours ending 11/27/23 0556   Net IO Since Admission: 838 mL [11/27/23 0556]  Physical Exam: Constitutional: Comfortable appearing elderly male laying in bed. In no acute distress. Pulm:Normal work of breathing on room air. Abdomen: Soft, non-tender, non-distended FDX:Wzhjupcz for extremity edema. Skin:Warm and dry. Neuro: Alert and oriented to person and place but not situation and time.  moves all 4 extremities independently.  Pertinent labs and imaging:      Latest Ref Rng & Units 11/26/2023    1:58 AM 11/25/2023    1:59 AM 11/24/2023    7:50 AM  CBC  WBC 4.0 - 10.5 K/uL 3.1  3.1  3.3   Hemoglobin 13.0 - 17.0 g/dL 8.9  8.4  8.4   Hematocrit 39.0 - 52.0 % 26.4  25.2  25.0   Platelets 150 - 400 K/uL 142  128  121        Latest Ref Rng & Units 11/25/2023    1:59 AM 11/24/2023    7:50 AM 11/23/2023    9:15 PM  CMP  Glucose 70 - 99 mg/dL 864  856  812    BUN 8 - 23 mg/dL 70  69  73   Creatinine 0.61 - 1.24 mg/dL 5.57  5.73  5.42   Sodium 135 - 145 mmol/L 140  136  139   Potassium 3.5 - 5.1 mmol/L 4.6  4.3  4.8   Chloride 98 - 111 mmol/L 107  106  107   CO2 22 - 32 mmol/L 21  20  21    Calcium  8.9 - 10.3 mg/dL 8.6  8.5  8.3   Total Protein 6.5 - 8.1 g/dL   7.6   Total Bilirubin 0.0 - 1.2 mg/dL   0.5   Alkaline Phos 38 - 126 U/L   56   AST 15 - 41 U/L   59   ALT 0 - 44 U/L   56     No results found.  ASSESSMENT/PLAN:  Assessment: Principal Problem:   GI bleeding  Gregory Mendiola. is a 78 y.o. 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  No signs of active bleeding.  Pending KUB in 1 week after PillCam endoscopy with GI. - Stable for discharge from GI bleed standpoint, pending SNF insurance Auth - Resume octreotide  subcutaneous every 4 weeks outpatient - Continue pantoprazole  40 mg daily  #Mild Transaminitis #Chronic HCV Appears to have been on treatment from August 2024-February 2025.  Hepatitis C quant elevated on 10/01/2023.  Previously on Mavyret  and will plan to resume this at discharge with plans to treat for 8 weeks with follow-up confirmatory testing.   #History of Ischemic CVA with residual dysarthria #Vascular dementia Patient has a history of stroke with left-sided defects due to ischemic stroke on 09/21/23 with infarcts in the left thalamus and parietal white matter.  Appears to be at baseline mental status. Holding aspirin  given recurrent GI bleed, will discuss with family member again today. -- Continue home atorvastatin  80 mg daily. - Hold home aspirin  81  mg  #Hypertension - Amlodipine  10 mg daily - Hydralazine  25 mg daily  #T2DM, controlled # A1c of 5.1% on 6/5.  Glucose elevated on admission at 187.  Not taking any antihyperglycemics currently. - Monitor, follow-up with PCP outpatient  #Leukopenia #Thrombocytopenia White blood cell 3.1 and platelets 120 on initial CBC.  Both appear to be chronic and stable.  Could be related to the phenol injection and/or chronic hep C.   #CKD5 Creatinine around baseline at 4.57, stable.  Best Practice: Diet: Regular diet IVF: Fluids: 0.9NS, Rate: 51mL/hr VTE: Place and maintain sequential compression device Start: 11/24/23 1634 Code: Full  Disposition planning: Therapy Recs: SNF, [facility] Family Contact: Nephew DISPO: Anticipated discharge in 0-1 days to Skilled nursing facility pending SNF insurance authorization.  Patient is medically stable for discharge.  Signature: Fairy Pool, DO Internal Medicine Resident, PGY-3 Please contact the on call pager at [phone] for any urgent or emergent needs. 5:56 AM 11/27/2023

## 2023-11-28 ENCOUNTER — Telehealth (HOSPITAL_COMMUNITY): Payer: Self-pay | Admitting: Pharmacy Technician

## 2023-11-28 ENCOUNTER — Other Ambulatory Visit (HOSPITAL_COMMUNITY): Payer: Self-pay

## 2023-11-28 DIAGNOSIS — D509 Iron deficiency anemia, unspecified: Secondary | ICD-10-CM | POA: Diagnosis not present

## 2023-11-28 DIAGNOSIS — K922 Gastrointestinal hemorrhage, unspecified: Secondary | ICD-10-CM | POA: Diagnosis not present

## 2023-11-28 MED ORDER — MELATONIN 3 MG PO TABS: 3.0000 mg | ORAL_TABLET | Freq: Every evening | ORAL | Status: DC | PRN

## 2023-11-28 MED ORDER — ASPIRIN 81 MG PO TBEC: 81.0000 mg | DELAYED_RELEASE_TABLET | Freq: Every evening | ORAL | Status: DC

## 2023-11-28 MED ORDER — MAVYRET 100-40 MG PO TABS: 3.0000 | ORAL_TABLET | Freq: Every day | ORAL | Status: DC

## 2023-11-28 NOTE — TOC Progression Note (Signed)
 Transition of Care [facility]) - Progression Note    Patient Details  Name: Gregory Nunez. MRN: [id number] Date of Birth: 10/31/1945  Transition of Care [facility]) CM/SW Contact  Montie LOISE [place], [place] Phone Number: 11/28/2023, 2:16 PM  Clinical Narrative:     CSW spoke with patient's nephew, Ozell- CSW introduced self and explained role. CSW informed Ozell, insurance denied approval for SNF. He reports family is working on him returning home with one of his sisters w/ the assistance of person care services. He states family still needs a couple more days to prepare.  He states the patient was receiving care through [facility]. He states patient can no longer live at home alone.   Patient has been at [facility] since 09/24/23. [facility] is aware the peer to peer was denied but was still agreeable to the patient returning.  Patient's nephew, Ozell was informed he could appeal [facility] decision but declined. He was agreeable to [facility] re-starting the auth  for SNF and discharging the patient back to [facility].  MD team updated.  Montie Vicci, MSW, LCSW Clinical Social Worker    Expected Discharge Plan: Skilled Nursing Facility Barriers to Discharge: Barriers Resolved               Expected Discharge Plan and Services     Post Acute Care Choice: Skilled Nursing Facility Living arrangements for the past 2 months: Skilled Nursing Facility Expected Discharge Date: 11/28/23                                     Social Drivers of Health (SDOH) Interventions SDOH Screenings   Food Insecurity: Patient Unable To Answer (11/24/2023)  Housing: Low Risk  (11/24/2023)  Transportation Needs: Patient Unable To Answer (11/24/2023)  Utilities: Patient Unable To Answer (11/24/2023)  Alcohol Screen: Low Risk  (09/21/2023)  Depression (PHQ2-9): Low Risk  (11/03/2022)  Financial Resource Strain: Low Risk  (09/21/2023)  Physical Activity: Insufficiently Active (09/21/2023)  Social  Connections: Patient Unable To Answer (11/24/2023)  Recent Concern: Social Connections - Socially Isolated (09/21/2023)  Stress: No Stress Concern Present (09/21/2023)  Tobacco Use: Medium Risk (11/23/2023)  Health Literacy: Adequate Health Literacy (11/03/2022)    Readmission Risk Interventions    10/17/2023    4:48 PM 06/20/2023    2:55 PM 06/15/2023    3:00 PM  Readmission Risk Prevention Plan  Post Dischage Appt   Complete  Medication Screening   Complete  Transportation Screening Complete Complete Complete  PCP or Specialist Appt within 5-7 Days  Complete   Home Care Screening  Complete   Medication Review (RN CM)  Complete

## 2023-11-28 NOTE — Discharge Summary (Signed)
 Name: Gregory Nunez. MRN: [id number] DOB: 08-Aug-1945 78 y.o. PCP: Charmayne Holmes, DO  Date of Admission: 11/23/2023  8:56 PM Date of Discharge: 11/28/2023  Attending Physician: Dr. MICAEL Riis Winfrey  Discharge Diagnosis: Principal Problem:   GI bleeding Acute on Chronic Anemia d/t GI Bleeding Chronic Duodenal AVMs Anemia of Chronic Disease Mild Transaminitis Chronic HCV infection History of Ischemic CVA  Dysarthria Vascular Dementia Hypetension T2DM Leukopenia Thrombocytopenia CKD5 Nonreactive Hepatitis B Immunity  Discharge Medications: Allergies as of 11/28/2023       Reactions   Penicillins Other (See Comments)   Pt does not remember reaction but states he woke up in the hospital after taking        Medication List     STOP taking these medications    levETIRAcetam  500 MG tablet Commonly known as: KEPPRA        TAKE these medications    amLODipine  10 MG tablet Commonly known as: NORVASC  Take 1 tablet (10 mg total) by mouth daily.   aspirin  EC 81 MG tablet Take 1 tablet (81 mg total) by mouth daily. Swallow whole. What changed: when to take this   atorvastatin  80 MG tablet Commonly known as: LIPITOR  Take 1 tablet (80 mg total) by mouth daily. What changed: when to take this   hydrALAZINE  25 MG tablet Commonly known as: APRESOLINE  Take 1 tablet (25 mg total) by mouth every 8 (eight) hours.   Mavyret  100-40 MG Tabs Generic drug: Glecaprevir -Pibrentasvir  Take 3 tablets by mouth daily.   melatonin 3 MG Tabs tablet Take 1 tablet (3 mg total) by mouth at bedtime as needed.   multivitamin Tabs tablet Take 1 tablet by mouth daily.   Nutritional Drink Liqd Take 90 mLs by mouth in the morning, at noon, and at bedtime. Med Plus 2.0   pantoprazole  40 MG tablet Commonly known as: PROTONIX  Take 1 tablet (40 mg total) by mouth 2 (two) times daily.   SandoSTATIN  LAR Depot 20 MG injection Generic drug: octreotide  Inject 20 mg into the  muscle every 28 (twenty-eight) days.   sennosides-docusate sodium  8.6-50 MG tablet Commonly known as: SENOKOT-S Take 1 tablet by mouth daily.   thiamine  100 MG tablet Commonly known as: Vitamin B-1 Take 1 tablet (100 mg total) by mouth daily.        Disposition and follow-up:   Gregory Jeramey Lanuza. was discharged from [facility] in Good condition.  At the hospital follow up visit please address:  1.  Follow-up:  a. GI recommendations and results from KUB that is to be completed on 8/15, ensuring that the Givens capsule study has passed.    b. CBC and CMP to follow up his renal, liver, and blood count have remained stable since discharge. He will also be discharged on Mavyret  for his chronic hepatitis C infection and need follow-up on this medication as well.   2.  Labs / imaging needed at time of follow-up: CBC, CMP   Follow-up Appointments:   Hospital Course by problem list: Gregory Vera. is a 78 y.o. 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   #Chronic duodenal AVM #Acute on Chronic Anemia d/t GI bleeding #Anemia of Chronic Disease Patient without subjective complaints but questionable  whether there were dark stools at SNF. ED physician notes dark stool on patient's brief during his exam.  Vital signs stable. No pertinent exam findings. Previous push enteroscopy on 10/14/23 showing multiple AVMs in duodenum that were treated with argon plasma coagulation. Patient's hemoglobin improved to 8.4 from 6.7 after 1 unit of packed red blood cells in the emergency department. Trended H&H's while inpatient, hemoglobin stable at 8.4. Normocytic anemia is likely due to known duodenal AVMs,  however, the patient has CKD5 which may be a contributing factor. He received IV Protonix  40 mg in the ED. The patient is stable and has GI appointment scheduled for 12/09/2023 with [facility] from prior admission. Recommendations from prior hospitalization for GI bleed were outpatient follow-up, capsule endoscopy if still bleeding.  Continued PPI twice daily, octreotide  Depo q 28 days, and took caution with iron  pills. Per dispense history, Octreotide  was dispensed by pharmacy but SNF is unable to confirm whether this was administered or not.  Consulted GI for recommendations on whether the patient would benefit from inpatient capsule endoscopy. GI evaluated and proceeded with capsule endoscopy after half of colonoscopy prep. He may not be a candidate for bevacizumab  if he has persistent bleeding. Per nursing staff, the patient refused his bowel prep for capsule endoscopy but was able to swallow capsule.  Unsure of what the quality of exam will be. I do not think we will be able to have a risk/benefit discussion about resuming aspirin  with the patient but I discussed this with one of his family members today.  Aspirin  was resumed prior to discharge with the caveat of if there is any additional bleeding this medication should be stopped and not restarted due to the risks of rehospitalization for GI bleed outweighing the risks of microvascular strokes.   #Mild Transaminitis #Chronic HCV AST of 59 and ALT of 56 are slightly elevated from baseline on admission. Previously saw Dr. Eben on 11/23/22 for chronic HCV and treated with Mavyret  of which he was on treatment from August 2024-February 2025.  FIB4 score is 4.99 so he will likely need GI follow up for elastogram. Per chart review, he was supposed to get an elastogram in August 2024 but this was not completed. Plan to resume Mavyret  at discharge with plans to treat for 8 weeks with follow-up confirmatory testing.  Hepatitis panel was rechecked, still has active  hepatitis C infection.  He also shows that he has no immune response to hepatitis B, will need an updated hepatitis B vaccine outpatient.   #History of Ischemic CVA with residual dysarthria #Vascular dementia Patient had an ischemic stroke on 09/21/23 with infarcts in the left thalamus and parietal white matter. He appeared to be at baseline mental status. Given the acute concerns for GI bleed with hemoglobin < 7, will held aspirin .  Continued home atorvastatin  80 mg while inpatient.  Nursing staff reported patient has been agitated, especially at night.  He has also reported having difficulty sleeping.  As needed melatonin 3mg  was ordered.  #Hypertension Blood pressure on admission is 151/85.  Blood pressures have been normotensive to slightly hypertensive while here. Continued home amlodipine  10mg  daily and hydralazine  25mg  daily.   #T2DM Last A1c of 5.1% on 09/22/23. Glucose elevated while inpatient, but no higher than 190. Not currently taking any anti-hyperglycemics as disease is well-controlled.  Will need to follow-up PCP outpatient.    #Leukopenia #Thrombocytopenia WBC down to 3.1 and platelets of 120 on initial CBC. These CBC abnormalities appear to be  chronic and stable issues. We will continue to follow while the patient requires inpatient care. He was placed on heparin  for VTE and we monitored for signs of HIT.  Patient refused heparin  injection, has not received any while inpatient.    #CKD5 Cr is around 4.57, around baseline. Appears stable. Will require heparin  for VTE due to renal dysfunction.  Patient refused heparin  multiple times.   Discharge Subjective: Patient was feeling well today and slept well overnight. Doing well this morning without any new or worsening symptoms.  He is at baseline mental status which is alert and oriented to person, place, +/- situation, but not time.  He wants to go home and knows that he came from [facility] healthcare SNF.  Discharge Exam:   BP  127/70 (BP Location: Left Arm)   Pulse 92   Temp 98 F (36.7 C)   Resp 16   Ht 6' (1.829 m)   Wt 66 kg   SpO2 100%   BMI 19.73 kg/m  Constitutional: thin-appearing male laying in bed, in no acute distress HENT: normocephalic atraumatic, mucous membranes moist Eyes: conjunctiva non-erythematous Neck: supple Cardiovascular: regular rate and rhythm, no m/r/g Pulmonary/Chest: normal work of breathing on room air, lungs clear to auscultation bilaterally Abdominal: soft, non-tender, non-distended Neurological: alert & oriented x 3, 5/5 strength in bilateral upper and lower extremities, normal gait Skin: warm and dry Psych: normal mood and affect, at baseline   Pertinent Labs, Studies, and Procedures:     Latest Ref Rng & Units 11/27/2023    5:38 AM 11/26/2023    1:58 AM 11/25/2023    1:59 AM  CBC  WBC 4.0 - 10.5 K/uL 3.1  3.1  3.1   Hemoglobin 13.0 - 17.0 g/dL 9.5  8.9  8.4   Hematocrit 39.0 - 52.0 % 28.3  26.4  25.2   Platelets 150 - 400 K/uL 157  142  128        Latest Ref Rng & Units 11/27/2023    5:38 AM 11/25/2023    1:59 AM 11/24/2023    7:50 AM  CMP  Glucose 70 - 99 mg/dL 891  864  856   BUN 8 - 23 mg/dL 71  70  69   Creatinine 0.61 - 1.24 mg/dL 5.17  5.57  5.73   Sodium 135 - 145 mmol/L 138  140  136   Potassium 3.5 - 5.1 mmol/L 4.4  4.6  4.3   Chloride 98 - 111 mmol/L 106  107  106   CO2 22 - 32 mmol/L 20  21  20    Calcium  8.9 - 10.3 mg/dL 8.9  8.6  8.5     No results found.   Discharge Instructions:   Discharge Instructions      Manas Hickling.,  You were recently admitted to [facility] for a drop in your hemoglobin and bloody, dark bowel movements that indicated you had a bleed somewhere in your GI tract. We had the GI doctors work with you to see where your bleed was, and they want to see you outside of the hospital to make sure you passed your capsule endoscopy appropriately.  Continue taking your home medications with the following  changes  Start taking: Miralax  powder for constipation. Mavyret  100-40mg  tablets, three times a day. This medication will help clear your hepatitis C infection. It is recommended you continue this medication for 8 weeks. We would recommend you also follow up with Dr. Eben to work with  your Hepatitis C infection.  Continue taking: Amlodipine  20mg  tablet, once daily Aspirin  81mg  tablet, once daily Atorvastatin  80mg  tablet, once daily Hydralazine  25mg  every 8 hours Multivitamin  Nutritional drinks Pantoprazole  40mg  (Protonix ) Octreotide  20mg  injection Senokot tablet Thiamine  100mg    You should seek further medical care if you have dark or bloody bowel movements, any bloody vomiting, or have new dizziness that makes you pass out. If you have symptoms of bleeding requiring another hospitalization, then it would be best to stop your Aspirin  completely, despite its benefits, because the risks for bleeding will become too high to be considered therapeutic.  We recommend that you see your primary care doctor in about a week to make sure that you continue to improve. We are so glad that you are feeling better.  Sincerely, Jaden Amilibia, DO     Signed: Fairy Pool, DO Internal Medicine Resident, PGY-1 11/28/2023, 2:51 PM   Please contact the on call pager after 5 pm and on weekends at [phone].

## 2023-11-28 NOTE — Progress Notes (Signed)
 HD#5 SUBJECTIVE:  Patient Summary: Gregory Bua. is a 78 y.o. 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   Overnight Events: none  Interim History:  Doing well this morning without any new or worsening symptoms.  He is at baseline mental status which is alert and oriented to person, place, +/- situation, but not time.  He wants to go home and knows that he came from [facility] healthcare SNF.  OBJECTIVE:  Vital Signs: Vitals:   11/27/23 2041 11/27/23 2335 11/28/23 0500 11/28/23 0814  BP: (!) 149/77 138/78 (!) 154/83 127/70  Pulse: 78 89 91 92  Resp:      Temp: 97.8 F (36.6 C) 97.6 F (36.4 C) 97.8 F (36.6 C) 98 F (36.7 C)  TempSrc:  Oral Oral   SpO2: 100% 100% 99% 100%  Weight:      Height:       Supplemental O2: Room Air SpO2: 100 %  Filed Weights   11/23/23 2102  Weight: 66 kg    No intake or output data in the 24 hours ending 11/28/23 1131   Net IO Since Admission: 838 mL [11/28/23 1131]  Physical Exam: Constitutional: Comfortable appearing elderly male laying in bed. In no acute distress. Pulm: Normal work of breathing on room air. Abdomen: Soft, non-tender, non-distended MSK: Negative for extremity edema. Skin: Warm and dry. Neuro: Alert and oriented to person and place but not situation and time. Moves all 4 extremities independently.  Pertinent labs and imaging:      Latest Ref Rng & Units 11/27/2023    5:38 AM 11/26/2023    1:58 AM 11/25/2023    1:59 AM  CBC  WBC 4.0 - 10.5 K/uL 3.1  3.1  3.1   Hemoglobin 13.0 - 17.0 g/dL 9.5  8.9  8.4   Hematocrit 39.0 - 52.0 % 28.3  26.4  25.2   Platelets 150 - 400 K/uL 157  142  128        Latest Ref Rng & Units 11/27/2023    5:38 AM 11/25/2023    1:59 AM 11/24/2023    7:50 AM  CMP  Glucose 70 -  99 mg/dL 891  864  856   BUN 8 - 23 mg/dL 71  70  69   Creatinine 0.61 - 1.24 mg/dL 5.17  5.57  5.73   Sodium 135 - 145 mmol/L 138  140  136   Potassium 3.5 - 5.1 mmol/L 4.4  4.6  4.3   Chloride 98 - 111 mmol/L 106  107  106   CO2 22 - 32 mmol/L 20  21  20    Calcium  8.9 - 10.3 mg/dL 8.9  8.6  8.5     No results found.  ASSESSMENT/PLAN:  Assessment: Principal Problem:   GI bleeding  Gregory Nunez. is a 78 y.o. 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  No signs of active bleeding.  Pending KUB in 1 week after  PillCam endoscopy with GI. SNF insurance authorization denied due to patient's refusal for PT. Will contact family to discuss potential discharge to home and risk/benefits of Aspirin  use. Will have PT work with patient again to see if he becomes more participatory and reevaluate SNF authorization if he is. - Stable for discharge from GI bleed standpoint - Resume octreotide  subcutaneous every 4 weeks outpatient - Continue pantoprazole  40 mg daily  #Mild Transaminitis #Chronic HCV Appears to have been on treatment from August 2024-February 2025.  Hepatitis C quant elevated on 10/01/2023.  Previously on Mavyret  and will plan to resume this at discharge with plans to treat for 8 weeks with follow-up confirmatory testing.   #History of Ischemic CVA with residual dysarthria #Vascular dementia Patient has a history of stroke with left-sided defects due to ischemic stroke on 09/21/23 with infarcts in the left thalamus and parietal white matter.  Appears to be at baseline mental status. Holding aspirin  given recurrent GI bleed, will discuss with  family member again today. -- Continue home atorvastatin  80 mg daily. - Hold home aspirin  81 mg  #Hypertension - Amlodipine  10 mg daily - Hydralazine  25 mg daily  #T2DM, controlled # A1c of 5.1% on 6/5.  Glucose elevated on admission at 187.  Not taking any antihyperglycemics currently. - Monitor, follow-up with PCP outpatient  #Leukopenia #Thrombocytopenia White blood cell 3.1 and platelets 120 on initial CBC.  Both appear to be chronic and stable.  Could be related to the phenol injection and/or chronic hep C.   #CKD5 Creatinine around baseline at 4.57, stable.  Best Practice: Diet: Regular diet IVF: Fluids: 0.9NS, Rate: 17mL/hr VTE: Place and maintain sequential compression device Start: 11/24/23 1634 Code: Full  Disposition planning: Therapy Recs: SNF, [facility] Family Contact: Nephew DISPO: Anticipated discharge in 0-1 days to Skilled nursing facility pending SNF insurance authorization.  Patient is medically stable for discharge.  Signature: Kiran Lapine, DO Internal Medicine Resident, PGY-3 Please contact the on call pager at [phone] for any urgent or emergent needs. 11:31 AM 11/28/2023

## 2023-11-28 NOTE — Progress Notes (Signed)
 Physical Therapy Treatment Patient Details Name: Gregory Nunez. MRN: [id number] DOB: [birth year] Today's Date: 11/28/2023   History of Present Illness 78 yo male admitted with recurrent decline in hemoglobin. GIB. PMH:  iron  deficiency anemia, small bowel AVMs, colon polyps, CKD IV / V, DM, hypertension, alcohol abuse, thiamine  deficiency,  HCV, cholelithiasis , and CVA with residual dysarthria.    PT Comments  Patient progressing to OOB though did not want to stay in chair. Walked in room to closet and looked for his personal belongings in the closet prior to return to bed.  Patient difficult to understand though seems this is not his baseline.  He feels weak.  Noted potential for return to SNF though if he returns home may need RW; w/c, BSC and HHPT if he is denied rehab.  PT will continue to follow.     If plan is discharge home, recommend the following: A little help with walking and/or transfers;A little help with bathing/dressing/bathroom;Help with stairs or ramp for entrance;Assistance with cooking/housework   Can travel by private vehicle     No  Equipment Recommendations  Wheelchair (measurements PT);Hospital bed;Rolling walker (2 wheels);BSC/3in1    Recommendations for Other Services       Precautions / Restrictions Precautions Precautions: Fall     Mobility  Bed Mobility Overal bed mobility: Modified Independent                  Transfers Overall transfer level: Needs assistance Equipment used: 1 person hand held assist Transfers: Sit to/from Stand, Bed to chair/wheelchair/BSC Sit to Stand: Min assist   Step pivot transfers: Min assist       General transfer comment: assist for balance; hand hold for balance and safety stepping to recliner    Ambulation/Gait Ambulation/Gait assistance: Min assist Gait Distance (Feet): 6 Feet Assistive device: 1 person hand held assist Gait Pattern/deviations: Step-to pattern, Decreased stride length, Decreased  dorsiflexion - right       General Gait Details: A for balance and weakness, noted R foot drop with ambulation to closet and back with encouragement   Stairs             Wheelchair Mobility     Tilt Bed    Modified Rankin (Stroke Patients Only)       Balance Overall balance assessment: Needs assistance   Sitting balance-Leahy Scale: Good     Standing balance support: Single extremity supported Standing balance-Leahy Scale: Poor Standing balance comment: UE support and min A for balance esp initially standing                            Communication Communication Communication: Impaired Factors Affecting Communication: Difficulty expressing self;Reduced clarity of speech  Cognition Arousal: Alert Behavior During Therapy: WFL for tasks assessed/performed   PT - Cognitive impairments: Difficult to assess Difficult to assess due to: Impaired communication                         Following commands impaired: Only follows one step commands consistently, Follows one step commands with increased time    Cueing Cueing Techniques: Verbal cues, Gestural cues  Exercises      General Comments        Pertinent Vitals/Pain Pain Assessment Pain Assessment: No/denies pain    Home Living  Prior Function            PT Goals (current goals can now be found in the care plan section) Progress towards PT goals: Progressing toward goals    Frequency    Min 2X/week      PT Plan      Co-evaluation              AM-PAC PT 6 Clicks Mobility   Outcome Measure  Help needed turning from your back to your side while in a flat bed without using bedrails?: None Help needed moving from lying on your back to sitting on the side of a flat bed without using bedrails?: None Help needed moving to and from a bed to a chair (including a wheelchair)?: A Little Help needed standing up from a chair using your  arms (e.g., wheelchair or bedside chair)?: A Little Help needed to walk in hospital room?: Total Help needed climbing 3-5 steps with a railing? : Total 6 Click Score: 16    End of Session Equipment Utilized During Treatment: Gait belt Activity Tolerance: Patient tolerated treatment well Patient left: in bed;with call bell/phone within reach;with bed alarm set   PT Visit Diagnosis: Other abnormalities of gait and mobility (R26.89);Muscle weakness (generalized) (M62.81)     Time: 8789-8768 PT Time Calculation (min) (ACUTE ONLY): 21 min  Charges:    $Therapeutic Activity: 8-22 mins PT General Charges $$ ACUTE PT VISIT: 1 Visit                     Micheline Portal, PT Acute Rehabilitation Services [phone] 11/28/2023    Montie Portal 11/28/2023, 1:27 PM

## 2023-11-28 NOTE — Care Management Obs Status (Signed)
 MEDICARE OBSERVATION STATUS NOTIFICATION   Patient Details  Name: Gregory Nunez. MRN: [id number] Date of Birth: [birth year]   Medicare Observation Status Notification Given:  Yes  Observation status notification given to Gregory Nunez patients nephew at [phone] and will leave copy in patients room  Gregory Nunez 11/28/2023, 1:44 PM

## 2023-11-28 NOTE — Telephone Encounter (Signed)
 Pharmacy Patient Advocate Encounter   Received notification from Inpatient Request that prior authorization for Mavyret  100-40MG  tablets  is required/requested.   Insurance verification completed.   The patient is insured through [place] .   Per test claim: PA required; PA submitted to above mentioned insurance via Latent Key/confirmation #/EOC [id number] Status is pending

## 2023-11-28 NOTE — Plan of Care (Signed)

## 2023-11-28 NOTE — Telephone Encounter (Signed)
 Pharmacy Patient Advocate Encounter  Received notification from [facility] that Prior Authorization for Mavyret  100-40MG  tablets  has been DENIED.  Full denial letter will be uploaded to the media tab. See denial reason below. drug must be used with Sovaldi (sofosbuvir) and ribavirin.   PA #/Case ID/Reference #: [id number]

## 2023-11-28 NOTE — Discharge Instructions (Addendum)
 Gregory Nunez.,  You were recently admitted to [facility] for a drop in your hemoglobin and bloody, dark bowel movements that indicated you had a bleed somewhere in your GI tract. We had the GI doctors work with you to see where your bleed was, and they want to see you outside of the hospital to make sure you passed your capsule endoscopy appropriately.  Continue taking your home medications with the following changes  Start taking: Miralax  powder for constipation. Mavyret  100-40mg  tablets, three times a day. This medication will help clear your hepatitis C infection. It is recommended you continue this medication for 8 weeks. We would recommend you also follow up with Dr. Eben to work with your Hepatitis C infection.  Continue taking: Amlodipine  20mg  tablet, once daily Aspirin  81mg  tablet, once daily Atorvastatin  80mg  tablet, once daily Hydralazine  25mg  every 8 hours Multivitamin  Nutritional drinks Pantoprazole  40mg  (Protonix ) Octreotide  20mg  injection Senokot tablet Thiamine  100mg    You should seek further medical care if you have dark or bloody bowel movements, any bloody vomiting, or have new dizziness that makes you pass out. If you have symptoms of bleeding requiring another hospitalization, then it would be best to stop your Aspirin  completely, despite its benefits, because the risks for bleeding will become too high to be considered therapeutic.  We recommend that you see your primary care doctor in about a week to make sure that you continue to improve. We are so glad that you are feeling better.  Sincerely, Jaden Amilibia, DO

## 2023-11-28 NOTE — Telephone Encounter (Addendum)
 Patient Product/process development scientist completed.    The patient is insured through [facility].   Ran test claim for Mavyret  100-40 mg tablets and Requires Prior Authorization   This test claim was processed through [employer]- copay amounts may vary at other pharmacies due to [employer], or as the patient moves through the different stages of their insurance plan.     Reyes Sharps, CPHT Pharmacy Technician III Certified Patient Advocate [facility] Pharm[facility] Patient Advocate Team Direct Number: [phone]  Fax: [phone]

## 2023-11-28 NOTE — TOC Transition Note (Signed)
 Transition of Care [facility]) - Discharge Note   Patient Details  Name: Gregory Nunez. MRN: [id number] Date of Birth: [birth year]  [facility] CM/SW Contact:  Montie LOISE Vicci, LCSW Phone Number: 11/28/2023, 2:55 PM   Clinical Narrative:     Patient will Discharge to: [facility]  Discharge Date: 11/28/23 Family Notified: nephew  Transport Ab:EUJM  Per MD patient is ready for discharge. RN, patient, and facility notified of discharge. Discharge Summary sent to facility. RN given number for [phone]. Ambulance transport requested for patient.   Clinical Social Worker signing off.  Montie Vicci, MSW, LCSW Clinical Social Worker      Final next level of care: Skilled Nursing Facility Barriers to Discharge: Barriers Resolved   Patient Goals and CMS Choice            Discharge Placement                       Discharge Plan and Services Additional resources added to the After Visit Summary for       Post Acute Care Choice: Skilled Nursing Facility                               Social Drivers of Health (SDOH) Interventions SDOH Screenings   Food Insecurity: Patient Unable To Answer (11/24/2023)  Housing: Low Risk  (11/24/2023)  Transportation Needs: Patient Unable To Answer (11/24/2023)  Utilities: Patient Unable To Answer (11/24/2023)  Alcohol Screen: Low Risk  (09/21/2023)  Depression (PHQ2-9): Low Risk  (11/03/2022)  Financial Resource Strain: Low Risk  (09/21/2023)  Physical Activity: Insufficiently Active (09/21/2023)  Social Connections: Patient Unable To Answer (11/24/2023)  Recent Concern: Social Connections - Socially Isolated (09/21/2023)  Stress: No Stress Concern Present (09/21/2023)  Tobacco Use: Medium Risk (11/23/2023)  Health Literacy: Adequate Health Literacy (11/03/2022)     Readmission Risk Interventions    10/17/2023    4:48 PM 06/20/2023    2:55 PM 06/15/2023    3:00 PM  Readmission Risk Prevention Plan   Post Dischage Appt   Complete  Medication Screening   Complete  Transportation Screening Complete Complete Complete  PCP or Specialist Appt within 5-7 Days  Complete   Home Care Screening  Complete   Medication Review (RN CM)  Complete

## 2023-11-29 ENCOUNTER — Other Ambulatory Visit (HOSPITAL_COMMUNITY): Payer: Self-pay

## 2023-11-30 NOTE — Telephone Encounter (Signed)
 Spoke to Therapist, sports at [facility] (phone [phone]) where patient is staying for rehab. I have advised of need for KUB on 12/02/23 to look for retained capsule endoscopy pill. Amber states she can actually order KUB to be completed at the facility rather than transporting him. States she will place order.

## 2023-12-03 ENCOUNTER — Other Ambulatory Visit (HOSPITAL_COMMUNITY): Payer: Self-pay

## 2023-12-05 ENCOUNTER — Other Ambulatory Visit: Payer: Self-pay

## 2023-12-05 ENCOUNTER — Other Ambulatory Visit (HOSPITAL_COMMUNITY): Payer: Self-pay

## 2023-12-06 ENCOUNTER — Other Ambulatory Visit: Payer: Self-pay

## 2023-12-06 ENCOUNTER — Other Ambulatory Visit (HOSPITAL_COMMUNITY): Payer: Self-pay

## 2023-12-06 MED ORDER — MAVYRET 100-40 MG PO TABS: ORAL_TABLET | ORAL | 0 refills | Status: DC

## 2023-12-06 NOTE — Progress Notes (Signed)
 Denial letter for Mavyret  has been faxed to provider per request from [facility].

## 2023-12-09 ENCOUNTER — Ambulatory Visit: Admitting: Nurse Practitioner

## 2023-12-17 ENCOUNTER — Other Ambulatory Visit (HOSPITAL_COMMUNITY): Payer: Self-pay

## 2023-12-21 ENCOUNTER — Other Ambulatory Visit: Payer: Self-pay

## 2024-01-04 ENCOUNTER — Encounter (HOSPITAL_COMMUNITY): Payer: Self-pay

## 2024-01-04 ENCOUNTER — Observation Stay (HOSPITAL_COMMUNITY)
Admission: EM | Admit: 2024-01-04 | Discharge: 2024-01-06 | Disposition: A | Discharge status: Home / Self Care | Source: Skilled Nursing Facility | Attending: Internal Medicine | Admitting: Internal Medicine

## 2024-01-04 ENCOUNTER — Other Ambulatory Visit: Payer: Self-pay

## 2024-01-04 DIAGNOSIS — D696 Thrombocytopenia, unspecified: Secondary | ICD-10-CM | POA: Insufficient documentation

## 2024-01-04 DIAGNOSIS — I69322 Dysarthria following cerebral infarction: Secondary | ICD-10-CM

## 2024-01-04 DIAGNOSIS — D509 Iron deficiency anemia, unspecified: Principal | ICD-10-CM | POA: Insufficient documentation

## 2024-01-04 DIAGNOSIS — Q2733 Arteriovenous malformation of digestive system vessel: Secondary | ICD-10-CM | POA: Diagnosis not present

## 2024-01-04 DIAGNOSIS — K5521 Angiodysplasia of colon with hemorrhage: Secondary | ICD-10-CM | POA: Diagnosis present

## 2024-01-04 DIAGNOSIS — K5520 Angiodysplasia of colon without hemorrhage: Secondary | ICD-10-CM | POA: Diagnosis present

## 2024-01-04 DIAGNOSIS — Z79899 Other long term (current) drug therapy: Secondary | ICD-10-CM | POA: Insufficient documentation

## 2024-01-04 DIAGNOSIS — B182 Chronic viral hepatitis C: Secondary | ICD-10-CM | POA: Diagnosis present

## 2024-01-04 DIAGNOSIS — N185 Chronic kidney disease, stage 5: Secondary | ICD-10-CM | POA: Diagnosis not present

## 2024-01-04 DIAGNOSIS — I639 Cerebral infarction, unspecified: Secondary | ICD-10-CM | POA: Diagnosis present

## 2024-01-04 DIAGNOSIS — D631 Anemia in chronic kidney disease: Secondary | ICD-10-CM | POA: Diagnosis not present

## 2024-01-04 DIAGNOSIS — R2681 Unsteadiness on feet: Secondary | ICD-10-CM | POA: Insufficient documentation

## 2024-01-04 DIAGNOSIS — E1122 Type 2 diabetes mellitus with diabetic chronic kidney disease: Secondary | ICD-10-CM | POA: Diagnosis not present

## 2024-01-04 DIAGNOSIS — E1169 Type 2 diabetes mellitus with other specified complication: Secondary | ICD-10-CM | POA: Diagnosis present

## 2024-01-04 DIAGNOSIS — D62 Acute posthemorrhagic anemia: Secondary | ICD-10-CM

## 2024-01-04 DIAGNOSIS — D649 Anemia, unspecified: Principal | ICD-10-CM

## 2024-01-04 DIAGNOSIS — K922 Gastrointestinal hemorrhage, unspecified: Secondary | ICD-10-CM | POA: Diagnosis not present

## 2024-01-04 DIAGNOSIS — Z8673 Personal history of transient ischemic attack (TIA), and cerebral infarction without residual deficits: Secondary | ICD-10-CM | POA: Insufficient documentation

## 2024-01-04 DIAGNOSIS — Z8719 Personal history of other diseases of the digestive system: Secondary | ICD-10-CM

## 2024-01-04 DIAGNOSIS — M6281 Muscle weakness (generalized): Secondary | ICD-10-CM | POA: Diagnosis not present

## 2024-01-04 DIAGNOSIS — Z794 Long term (current) use of insulin: Secondary | ICD-10-CM | POA: Diagnosis not present

## 2024-01-04 DIAGNOSIS — Z7982 Long term (current) use of aspirin: Secondary | ICD-10-CM | POA: Diagnosis not present

## 2024-01-04 DIAGNOSIS — I120 Hypertensive chronic kidney disease with stage 5 chronic kidney disease or end stage renal disease: Secondary | ICD-10-CM | POA: Insufficient documentation

## 2024-01-04 DIAGNOSIS — I10 Essential (primary) hypertension: Secondary | ICD-10-CM | POA: Diagnosis present

## 2024-01-04 DIAGNOSIS — E785 Hyperlipidemia, unspecified: Secondary | ICD-10-CM | POA: Insufficient documentation

## 2024-01-04 DIAGNOSIS — F0150 Vascular dementia without behavioral disturbance: Secondary | ICD-10-CM

## 2024-01-04 LAB — COMPREHENSIVE METABOLIC PANEL WITH GFR
ALT: 14 U/L (ref 0–44)
AST: 18 U/L (ref 15–41)
Albumin: 2.5 g/dL — ABNORMAL LOW (ref 3.5–5.0)
Alkaline Phosphatase: 56 U/L (ref 38–126)
Anion gap: 11 (ref 5–15)
BUN: 78 mg/dL — ABNORMAL HIGH (ref 8–23)
CO2: 18 mmol/L — ABNORMAL LOW (ref 22–32)
Calcium: 8.3 mg/dL — ABNORMAL LOW (ref 8.9–10.3)
Chloride: 110 mmol/L (ref 98–111)
Creatinine, Ser: 4.36 mg/dL — ABNORMAL HIGH (ref 0.61–1.24)
GFR, Estimated: 13 mL/min — ABNORMAL LOW (ref >60)
Glucose, Bld: 401 mg/dL — ABNORMAL HIGH (ref 70–99)
Potassium: 4.3 mmol/L (ref 3.5–5.1)
Sodium: 139 mmol/L (ref 135–145)
Total Bilirubin: 0.4 mg/dL (ref 0.0–1.2)
Total Protein: 7.2 g/dL (ref 6.5–8.1)

## 2024-01-04 LAB — I-STAT VENOUS BLOOD GAS, ED
Acid-base deficit: 2 mmol/L (ref 0.0–2.0)
Bicarbonate: 22.6 mmol/L (ref 20.0–28.0)
Calcium, Ion: 1.15 mmol/L (ref 1.15–1.40)
HCT: 16 % — ABNORMAL LOW (ref 39.0–52.0)
Hemoglobin: 5.4 g/dL — CL (ref 13.0–17.0)
O2 Saturation: 98 %
Potassium: 4.6 mmol/L (ref 3.5–5.1)
Sodium: 142 mmol/L (ref 135–145)
TCO2: 24 mmol/L (ref 22–32)
pCO2, Ven: 37.3 mmHg — ABNORMAL LOW (ref 44–60)
pH, Ven: 7.39 (ref 7.25–7.43)
pO2, Ven: 107 mmHg — ABNORMAL HIGH (ref 32–45)

## 2024-01-04 LAB — CBC WITH DIFFERENTIAL/PLATELET
Abs Immature Granulocytes: 0.03 K/uL (ref 0.00–0.07)
Basophils Absolute: 0 K/uL (ref 0.0–0.1)
Basophils Relative: 0 %
Eosinophils Absolute: 0.2 K/uL (ref 0.0–0.5)
Eosinophils Relative: 4 %
HCT: 18.5 % — ABNORMAL LOW (ref 39.0–52.0)
Hemoglobin: 5.8 g/dL — CL (ref 13.0–17.0)
Immature Granulocytes: 1 %
Lymphocytes Relative: 14 %
Lymphs Abs: 0.9 K/uL (ref 0.7–4.0)
MCH: 30.9 pg (ref 26.0–34.0)
MCHC: 31.4 g/dL (ref 30.0–36.0)
MCV: 98.4 fL (ref 80.0–100.0)
Monocytes Absolute: 0.4 K/uL (ref 0.1–1.0)
Monocytes Relative: 7 %
Neutro Abs: 4.8 K/uL (ref 1.7–7.7)
Neutrophils Relative %: 74 %
Platelets: 140 K/uL — ABNORMAL LOW (ref 150–400)
RBC: 1.88 MIL/uL — ABNORMAL LOW (ref 4.22–5.81)
RDW: 13.9 % (ref 11.5–15.5)
WBC: 6.3 K/uL (ref 4.0–10.5)
nRBC: 0 % (ref 0.0–0.2)

## 2024-01-04 LAB — RETICULOCYTES
Immature Retic Fract: 11.8 % (ref 2.3–15.9)
RBC.: 1.89 MIL/uL — ABNORMAL LOW (ref 4.22–5.81)
Retic Count, Absolute: 34.2 K/uL (ref 19.0–186.0)
Retic Ct Pct: 1.8 % (ref 0.4–3.1)

## 2024-01-04 LAB — VITAMIN B12: Vitamin B-12: 814 pg/mL (ref 180–914)

## 2024-01-04 LAB — FERRITIN: Ferritin: 26 ng/mL (ref 24–336)

## 2024-01-04 LAB — PROTIME-INR
INR: 1.1 (ref 0.8–1.2)
Prothrombin Time: 15.2 s (ref 11.4–15.2)

## 2024-01-04 LAB — PREPARE RBC (CROSSMATCH)

## 2024-01-04 LAB — IRON AND TIBC
Iron: 31 ug/dL — ABNORMAL LOW (ref 45–182)
Saturation Ratios: 11 % — ABNORMAL LOW (ref 17.9–39.5)
TIBC: 281 ug/dL (ref 250–450)
UIBC: 250 ug/dL

## 2024-01-04 LAB — POC OCCULT BLOOD, ED: Fecal Occult Bld: POSITIVE — AB

## 2024-01-04 LAB — FOLATE: Folate: 7.7 ng/mL (ref >5.9)

## 2024-01-04 MED ORDER — SODIUM CHLORIDE 0.9% IV SOLUTION: Freq: Once | INTRAVENOUS | Status: AC

## 2024-01-04 MED ORDER — LACTATED RINGERS IV BOLUS
1000.0000 mL | Freq: Once | INTRAVENOUS | Status: AC
  Administered 2024-01-04: 1000 mL via INTRAVENOUS

## 2024-01-04 NOTE — H&P (Incomplete)
 Date: 01/04/2024               Patient Name:  Gregory Nunez. MRN: [id number]  DOB: 11-22-1945 Age / Sex: 78 y.o., male   PCP: Charmayne Holmes, DO         Medical Service: Internal Medicine Teaching Service         Attending Physician: Dr. MICAEL Riis Winfrey      First Contact: Rebecka Pion, DO}    Second Contact: Dr. Roetta Chars, MD          Pager Information: First Contact Pager: [phone]   Second Contact Pager: [phone]   SUBJECTIVE   Chief Complaint: ***  History of Present Illness: Gregory Cuartas. is a 78 y.o. male with PMH of past medical history of chronic duodenal AVM, anemia of chronic disease, chronic thrombocytopenia vascular dementia, CKD 5, history of ischemic CVA with residual dysarthria, chronic HCV.  [facility]. Dementia +dysarthria Low hemoblogin. D/c recent. GI bleed. Duodenal AVM, CKD stage 5. 2 Units. No abdominal pain, no blood but FOBT positive. Exactly what happened last time. No CTA bc contrast   Patient presents from [facility] with hemoglobin 5.8.  Patient has a history of vascular dementia and is an unreliable historian.  He is oriented to self and place he is asymptomatic denying dizziness, lightheadedness, weakness, blood in stool, black stool.  Denies diarrhea, constipation, nausea, vomiting.  He endorses weight loss of unknown amount.  Presents with *** Endorses *** Denies ***  *** Alert and oriented to self and place but not time His baseline is *** Denies diarrhea, n/v Denies dizziness or lightheaded or weakness Denies blood in stools or black stools Has had weight loss Endorses fair appetite and staying hydrated Denies chest pain, SOB Denies abdominal pain, acid reflux  No other complaints at this time Still producing urine and no symptoms at this time  Denies hematuria   *** ED Course: Labs significant for *** Imaging *** Received *** Consulted ***  Meds:  Patient reported: *** -amlodipine  10  mg -ASA 81 mg  -atorvastatin  80 mg  -hydralazine  25 mg TID -Mavyret  100-40 mg x 3 tablets daily -melatonin 3 mg -MVI -Protonix  40 mg  BID -Sandostatin  LAR 20 mg every 28 days last dose September 9 -thiamine  100 mg  No outpatient medications have been marked as taking for the 01/04/24 encounter [facility] Encounter).    Past Medical History ***  Past Surgical History Past Surgical History:  Procedure Laterality Date  . BIOPSY  04/16/2018   Procedure: BIOPSY;  Surgeon: Kristie Lamprey, MD;  Location: [facility] ENDOSCOPY;  Service: Endoscopy;;  . BIOPSY  05/23/2020   Procedure: BIOPSY;  Surgeon: Charlanne Groom, MD;  Location: [facility] ENDOSCOPY;  Service: Gastroenterology;;  EGD and COLON  . BIOPSY  12/09/2020   Procedure: BIOPSY;  Surgeon: Eda Iha, MD;  Location: [facility] ENDOSCOPY;  Service: Gastroenterology;;  . biopsy on throat     hx of   . CATARACT EXTRACTION Bilateral    Dr. Medford Ferrier  . COLONOSCOPY N/A 05/23/2020   Procedure: COLONOSCOPY;  Surgeon: Charlanne Groom, MD;  Location: [facility];  Service: Gastroenterology;  Laterality: N/A;  . ENTEROSCOPY N/A 10/07/2020   Procedure: ENTEROSCOPY;  Surgeon: Avram Lupita BRAVO, MD;  Location: [facility] ENDOSCOPY;  Service: Endoscopy;  Laterality: N/A;  . ENTEROSCOPY N/A 10/03/2023   Procedure: ENTEROSCOPY;  Surgeon: San Sandor GAILS, DO;  Location: [facility] ENDOSCOPY;  Service: Gastroenterology;  Laterality: N/A;  . ENTEROSCOPY N/A 10/14/2023  Procedure: ENTEROSCOPY;  Surgeon: Leigh Elspeth SQUIBB, MD;  Location: [facility] ENDOSCOPY;  Service: Gastroenterology;  Laterality: N/A;  . ESOPHAGOGASTRODUODENOSCOPY Left 04/16/2018   Procedure: ESOPHAGOGASTRODUODENOSCOPY (EGD);  Surgeon: Kristie Lamprey, MD;  Location: [facility];  Service: Endoscopy;  Laterality: Left;  . ESOPHAGOGASTRODUODENOSCOPY (EGD) WITH PROPOFOL  N/A 05/23/2020   Procedure: ESOPHAGOGASTRODUODENOSCOPY (EGD) WITH PROPOFOL ;  Surgeon: Charlanne Groom, MD;  Location: [facility]OS[facility];  Service: Gastroenterology;   Laterality: N/A;  . ESOPHAGOGASTRODUODENOSCOPY (EGD) WITH PROPOFOL  N/A 12/09/2020   Procedure: ESOPHAGOGASTRODUODENOSCOPY (EGD) WITH PROPOFOL ;  Surgeon: Eda Iha, MD;  Location: [facility] ENDOSCOPY;  Service: Gastroenterology;  Laterality: N/A;  . EYE SURGERY    . FOOT SURGERY    . GIVENS CAPSULE STUDY N/A 11/25/2023   Procedure: IMAGING PROCEDURE, GI TRACT, INTRALUMINAL, VIA CAPSULE;  Surgeon: Albertus Gordy HERO, MD;  Location: [facility] ENDOSCOPY;  Service: Gastroenterology;  Laterality: N/A;  . HOT HEMOSTASIS N/A 04/16/2018   Procedure: HOT HEMOSTASIS (ARGON PLASMA COAGULATION/BICAP);  Surgeon: Kristie Lamprey, MD;  Location: [facility];  Service: Endoscopy;  Laterality: N/A;  . HOT HEMOSTASIS N/A 05/23/2020   Procedure: HOT HEMOSTASIS (ARGON PLASMA COAGULATION/BICAP);  Surgeon: Charlanne Groom, MD;  Location: [facility];  Service: Gastroenterology;  Laterality: N/A;  . HOT HEMOSTASIS N/A 12/09/2020   Procedure: HOT HEMOSTASIS (ARGON PLASMA COAGULATION/BICAP);  Surgeon: Eda Iha, MD;  Location: [facility] ENDOSCOPY;  Service: Gastroenterology;  Laterality: N/A;  . HOT HEMOSTASIS N/A 10/03/2023   Procedure: EGD, WITH ARGON PLASMA COAGULATION;  Surgeon: San Sandor GAILS, DO;  Location: [facility] ENDOSCOPY;  Service: Gastroenterology;  Laterality: N/A;  . HOT HEMOSTASIS N/A 10/14/2023   Procedure: EGD, WITH ARGON PLASMA COAGULATION;  Surgeon: Leigh Elspeth SQUIBB, MD;  Location: [facility] ENDOSCOPY;  Service: Gastroenterology;  Laterality: N/A;  . LYMPHADENECTOMY Bilateral 02/27/2014   Procedure: BILATERAL LYMPHADENECTOMY;  Surgeon: Ricardo Likens, MD;  Location: [facility];  Service: Urology;  Laterality: Bilateral;  . POLYPECTOMY  05/23/2020   Procedure: POLYPECTOMY;  Surgeon: Charlanne Groom, MD;  Location: [facility] ENDOSCOPY;  Service: Gastroenterology;;  . PROSTATE BIOPSY  12/2013   Gleason 4+3=7, volume 31.31 cc  . ROBOT ASSISTED LAPAROSCOPIC RADICAL PROSTATECTOMY N/A 02/27/2014   Procedure: ROBOTIC ASSISTED LAPAROSCOPIC RADICAL  PROSTATECTOMY WITH INDOCYANINE GREEN DYE;  Surgeon: Ricardo Likens, MD;  Location: [facility];  Service: Urology;  Laterality: N/A;   Social:  Lives: [facility]*** Support: nephew listed as contact Level of Function: *** PCP: [name] King, Elliott, DO  Substances: -Tobacco: denies -Alcohol: denies -Recreational Drug: denies  Family History: *** Family History  Problem Relation Age of Onset  . Heart disease Mother   . Heart attack Father 64  . Cancer Sister        breast  . Colon cancer Neg Hx   . Esophageal cancer Neg Hx   . Rectal cancer Neg Hx   . Stomach cancer Neg Hx      Allergies: Allergies as of 01/04/2024 - Review Complete 01/04/2024  Allergen Reaction Noted  . Penicillins Other (See Comments) 05/16/2011    Review of Systems: A complete ROS was negative except as per HPI.   OBJECTIVE:   Physical Exam: Blood pressure (!) 166/86, pulse 90, temperature 98 F (36.7 C), temperature source Oral, resp. rate 15, height 6' (1.829 m), weight 66 kg, SpO2 100%.  Constitutional: well-appearing *** sitting in ***, in no acute distress HENT: normocephalic atraumatic, mucous membranes moist Eyes: conjunctiva non-erythematous Neck: supple Cardiovascular: regular rate and rhythm, no m/r/g Pulmonary/Chest: normal work of breathing on room air, lungs clear to auscultation bilaterally  Abdominal: soft, non-tender, non-distended MSK: normal bulk and tone Neurological: alert & oriented x 3, 5/5 strength in bilateral upper and lower extremities, normal gait Skin: warm and dry Psych: ***  Labs: CBC    Component Value Date/Time   WBC 6.3 01/04/2024 2211   RBC 1.89 (L) 01/04/2024 2211   RBC 1.88 (L) 01/04/2024 2211   HGB 5.8 (LL) 01/04/2024 2211   HGB 10.6 (L) 10/14/2022 1016   HCT 18.5 (L) 01/04/2024 2211   HCT 32.2 (L) 10/14/2022 1016   PLT 140 (L) 01/04/2024 2211   PLT 166 10/14/2022 1016   MCV 98.4 01/04/2024 2211   MCV 93 10/14/2022 1016   MCH 30.9 01/04/2024 2211   MCHC 31.4  01/04/2024 2211   RDW 13.9 01/04/2024 2211   RDW 11.9 10/14/2022 1016   LYMPHSABS 0.9 01/04/2024 2211   MONOABS 0.4 01/04/2024 2211   EOSABS 0.2 01/04/2024 2211   BASOSABS 0.0 01/04/2024 2211     CMP     Component Value Date/Time   NA 139 01/04/2024 2211   NA 140 10/14/2022 1016   K 4.3 01/04/2024 2211   CL 110 01/04/2024 2211   CO2 18 (L) 01/04/2024 2211   GLUCOSE 401 (H) 01/04/2024 2211   BUN 78 (H) 01/04/2024 2211   BUN 39 (H) 10/14/2022 1016   CREATININE 4.36 (H) 01/04/2024 2211   CREATININE 1.52 (H) 08/28/2020 1427   CALCIUM  8.3 (L) 01/04/2024 2211   PROT 7.2 01/04/2024 2211   PROT 7.4 10/14/2022 1016   ALBUMIN 2.5 (L) 01/04/2024 2211   ALBUMIN 4.0 10/14/2022 1016   AST 18 01/04/2024 2211   ALT 14 01/04/2024 2211   ALKPHOS 56 01/04/2024 2211   BILITOT 0.4 01/04/2024 2211   BILITOT <0.2 10/14/2022 1016   GFRNONAA 13 (L) 01/04/2024 2211   GFRNONAA 45 (L) 08/28/2020 1427   GFRAA 52 (L) 08/28/2020 1427    Imaging: *** No results found.   EKG: personally reviewed my interpretation is***. Prior EKG***  ASSESSMENT & PLAN:   Assessment & Plan by Problem: Active Problems:   * No active hospital problems. *   Gregory Lasecki. is a 78 y.o. person living with a history of *** who presented with *** and admitted for *** on hospital day 0  Chronic Duodenal AVM Acute on Chronic Anemia d/t GI bleeding  Anemia of Chronic Disease Last hospoitalization 1 month ago (11/23/2023) after stay for anemia secondary to GI bleed. History of duodenal AVM. Saw GI refused bowel prep so did PillCam.  8/15 got KUB to ensure that pill capsule passed, unsure if capsule passed.  Was nonadherent to PillCam equipment.  CKD5 could be contributing to anemia Asymptomatic -stop ASA -  History of Ischemic CVA with residual dysarthria  Vascular Dementia  Hypertension ***  T2DM Glucose 400 on admit   Thrombocytopenia (stable)  CKD5 Could be contributing to anemia   Chronic  HCV LFTs normalized    Best practice: Diet: {NAMES:3044014::Normal,Heart Healthy,Carb-Modified,Renal,Carb/Renal,NPO,TPN,Tube Feeds} VTE: {NAMES:3044014::Heparin ,Enoxaparin ,SCDs,DOAC,None} IVF: {NAMES:3044014::None,NS,1/2 NS,LR,D5,D10},{NAMES:3044014::None,10cc/hr,25cc/hr,50cc/hr,75cc/hr,100cc/hr,110cc/hr,125cc/hr,Bolus} Code: {NAMES:3044014::Full,DNR,DNI,DNR/DNI,Comfort Care,Unknown}  Disposition planning: Prior to Admission Living Arrangement: {NAMES:3044014::Home, living ***,SNF, ***,Homeless,***} Anticipated Discharge Location: {NAMES:3044014::Home,SNF,CIR,***}  Dispo: Admit patient to {STATUS:3044014::Observation with expected length of stay less than 2 midnights.,Inpatient with expected length of stay greater than 2 midnights.}  Signed: Benuel Braun, DO Internal Medicine Resident  01/04/2024, 11:14 PM  On Call pager: [phone]

## 2024-01-04 NOTE — ED Triage Notes (Signed)
 Pt bib ems from [facility] chief complaint of abnormal labs. Facility stated that pt has a resulted hgb of 5.7 and hct of 17. Pt does has hx of Dementia. Alert and oriented to self, and place during triage. BGL 565 from ems, 380 during triage. Pt denies any pain/ SOB

## 2024-01-04 NOTE — Hospital Course (Signed)
 Gregory Nunez Gregory Nunez. is a 78 y.o. person living with a history of chronic duodenal AVM, anemia of chronic disease, chronic thrombocytopenia vascular dementia, CKD 5, history of ischemic CVA with residual dysarthria, HTN, chronic HCV who presented with hemoglobin 5.8 and admitted for acute on chronic blood loss anemia on hospital day    Chronic Duodenal AVM Acute on Chronic Anemia d/t GI bleeding  Anemia of Chronic Disease Presented with hemoglobin 5.8 and asymptomatic denying dizziness, lightheadedness, weakness.  Denied blood in stool or melena. On admission, pt said he felt well overall and was just there because his blood numbers were down. Pt was FOBT+. Previous push enteroscopy on 10/14/2023 showed multiple AVMs in duodenum that were treated with argon plasma coagulation.  GI attempted PillCam during last hospitalization 11/28/2023 but that was unsuccessful.  Patient was lost to follow-up outpatient. Pt was Hemodynamically stable on admission with BP 174/91, regular rate, 2+ radial pulse. His last dose of octreotide  was on 12/27/2023. Pt received 2 units of blood. His Asprin was stopped during admission due concerns for acute bleed. Continued oral Protonix  40 mg twice daily. Pt's hgb continued to improve and was stable around 9.3 during discharge. GI was consulted and recommended managing pt conservatively with blood and observing for any bleeds. GI recommended that a procedure was not necessary at this time if Hgb was stable with no signs of ongoing bleeding.His iron  levels were low so we gave him IV iron . Saw pt on discharge day, he said that he felt fine. His speech was hard to understand due to dysarthria. Pt was stable to be discharged to SNF. He needs OP monitoring on hgb levels and signs of bleeding.   History of Ischemic CVA with residual dysarthria  Vascular Dementia Ischemic stroke documented 09/21/2023 with infarcts in left thalamus and parietal white matter.  On admission, patient oriented to  self and place but not year or month.  Patient believes that he lives at home across the street from the hospital and is checked in on by his aunt 3 times a month.  Dysarthria present on physical exam on admission and during discharge. Pt can continue aspirin  and atorvastatin  upon discharge. Pt's home medications included Plavix  for CVA, however, he was not taking it, per charting. Pt informed to not take plavix .    Hypertension Patient was hypertensive with BP of 174/91 on admission and had a BP of 170/90 on discharge. Home medications included amlodipine  10 mg and hydralazine  25 mg 3 times daily that were continued.   T2DM (controlled) Glucose 400 on admit.  Last documented A1c on 09/22/2023 was 5.1. Pt was placed on SSI. His CBG was 115 during discharge.    Chronic Thrombocytopenia (stable) Chronic Iron  Deficiency Anemia Platelets 140 on admission.  Last admission platelets increased to 157 before discharge.  Likely from chronic kidney disease or iron  deficiency anemia. Current levels normalized to 150.    CKD5 Stable since last hospitalization   Chronic HCV HCV antibody reactive 11/24/2023.  Currently on  MAVYRET  100 to 48 mg x 3 tablets daily. LFTs normalized from previous admission

## 2024-01-04 NOTE — H&P (Incomplete)
 Date: 01/05/2024               Patient Name:  Gregory Nunez. MRN: [id number]  DOB: [birth year] Age / Sex: 78 y.o., male   PCP: Charmayne Holmes, DO         Medical Service: Internal Medicine Teaching Service         Attending Physician: Dr. MICAEL Riis Winfrey      First Contact: Rebecka Pion, DO}    Second Contact: Dr. Roetta Chars, MD          Pager Information: First Contact Pager: [phone]   Second Contact Pager: [phone]   SUBJECTIVE   Chief Complaint: Hemoglobin 5.8  History of Present Illness: Gregory Burkett. is a 78 y.o. male with PMH of past medical history of chronic duodenal AVM, anemia of chronic disease, chronic thrombocytopenia vascular dementia, CKD 5, history of ischemic CVA with residual dysarthria, HTN, chronic HCV.  Patient presents from [facility] with hemoglobin 5.8.  Patient has a history of vascular dementia and is an unreliable historian.  He is oriented to self and place, on questioning he believes that he still lives alone and is checked on by his aunt 3 times a month, however patient is a resident of [facility].)  He is asymptomatic denying dizziness, lightheadedness, weakness, blood in stool, black stool.  Denies diarrhea, constipation, nausea, vomiting, abdominal pain, acid reflux, hematuria.  He endorses weight loss of unknown amount in unknown time, eating and drinking normally.  Denies chest pain, shortness of breath.  He states he overall feels very well and is just here because his blood numbers were down.  Last hospitalization was 11/28/2023 for GI bleeding and acute on chronic anemia secondary to GI bleed.  Previous push enteroscopy on 10/14/2023 showed multiple AVMs in duodenum that were treated with argon plasma coagulation.  He received PillCam, however it was not adherent to the equipment and refused bowel prep.  Quality of exam would have likely been poor, and unclear if PillCam was obtained as there was supposed to be a  follow-up KUB for retained PillCam in nursing facility and I do not see a KUB documented.  Aspirin  was resumed prior to discharge with the caveat of stopping if there was any additional bleeding.  He had an appointment scheduled with  GI on 8/22/202 but patient no-showed.  ED Course: Labs significant for hemoglobin 5.8, hematocrit 18.5, platelets 140, INR 1.1, PT 15.2, B12 814, folate 7.7, iron  31, percent saturation 11, ferritin 26, absolute reticulocyte count 34.2, CO2 18, glucose 401, BUN 78, creatinine 4.36, GFR 13.  Imaging x-ray abdomen ordered Received LR bolus 1 L, 2 units of blood ordered but not yet received.  Meds:  Documented from [facility] paperwork -amlodipine  10 mg -ASA 81 mg  -atorvastatin  80 mg  -hydralazine  25 mg TID -Mavyret  100-40 mg x 3 tablets daily -melatonin 3 mg -MVI -Protonix  40 mg  BID -Sandostatin  LAR 20 mg every 28 days last dose September 9 -thiamine  100 mg  No outpatient medications have been marked as taking for the [facility]).    Past Medical History chronic duodenal AVM, anemia of chronic disease, chronic thrombocytopenia vascular dementia, CKD 5, history of ischemic CVA with residual dysarthria, HTN, chronic HCV  Past Surgical History Past Surgical History:  Procedure Laterality Date   BIOPSY  04/16/2018   Procedure: BIOPSY;  Surgeon: Kristie Lamprey, MD;  Location: [facility];  Service: Endoscopy;;   BIOPSY  05/23/2020  Procedure: BIOPSY;  Surgeon: Charlanne Groom, MD;  Location: [facility];  Service: Gastroenterology;;  EGD and COLON   BIOPSY  12/09/2020   Procedure: BIOPSY;  Surgeon: Eda Iha, MD;  Location: [facility] ENDOSCOPY;  Service: Gastroenterology;;   biopsy on throat     hx of    CATARACT EXTRACTION Bilateral    Dr. Medford Ferrier   COLONOSCOPY N/A 05/23/2020   Procedure: COLONOSCOPY;  Surgeon: Charlanne Groom, MD;  Location: [facility];  Service: Gastroenterology;  Laterality: N/A;    ENTEROSCOPY N/A 10/07/2020   Procedure: ENTEROSCOPY;  Surgeon: Avram Lupita BRAVO, MD;  Location: [facility] ENDOSCOPY;  Service: Endoscopy;  Laterality: N/A;   ENTEROSCOPY N/A 10/03/2023   Procedure: ENTEROSCOPY;  Surgeon: San Sandor GAILS, DO;  Location: [facility] ENDOSCOPY;  Service: Gastroenterology;  Laterality: N/A;   ENTEROSCOPY N/A 10/14/2023   Procedure: ENTEROSCOPY;  Surgeon: Leigh Elspeth SQUIBB, MD;  Location: [facility] ENDOSCOPY;  Service: Gastroenterology;  Laterality: N/A;   ESOPHAGOGASTRODUODENOSCOPY Left 04/16/2018   Procedure: ESOPHAGOGASTRODUODENOSCOPY (EGD);  Surgeon: Kristie Lamprey, MD;  Location: [facility];  Service: Endoscopy;  Laterality: Left;   ESOPHAGOGASTRODUODENOSCOPY (EGD) WITH PROPOFOL  N/A 05/23/2020   Procedure: ESOPHAGOGASTRODUODENOSCOPY (EGD) WITH PROPOFOL ;  Surgeon: Charlanne Groom, MD;  Location: [facility];  Service: Gastroenterology;  Laterality: N/A;   ESOPHAGOGASTRODUODENOSCOPY (EGD) WITH PROPOFOL  N/A 12/09/2020   Procedure: ESOPHAGOGASTRODUODENOSCOPY (EGD) WITH PROPOFOL ;  Surgeon: Eda Iha, MD;  Location: [facility] ENDOSCOPY;  Service: Gastroenterology;  Laterality: N/A;   EYE SURGERY     FOOT SURGERY     GIVENS CAPSULE STUDY N/A 11/25/2023   Procedure: IMAGING PROCEDURE, GI TRACT, INTRALUMINAL, VIA CAPSULE;  Surgeon: Albertus Gordy HERO, MD;  Location: [facility] ENDOS[facility];  Service: Gastroenterology;  Laterality: N/A;   HOT HEMOSTASIS N/A 04/16/2018   Procedure: HOT HEMOSTASIS (ARGON PLASMA COAGULATION/BICAP);  Surgeon: Kristie Lamprey, MD;  Location: [facility];  Service: Endoscopy;  Laterality: N/A;   HOT HEMOSTASIS N/A 05/23/2020   Procedure: HOT HEMOSTASIS (ARGON PLASMA COAGULATION/BICAP);  Surgeon: Charlanne Groom, MD;  Location: [facility];  Service: Gastroenterology;  Laterality: N/A;   HOT HEMOSTASIS N/A 12/09/2020   Procedure: HOT HEMOSTASIS (ARGON PLASMA COAGULATION/BICAP);  Surgeon: Eda Iha, MD;  Location: [facility] ENDOSCOPY;  Service: Gastroenterology;  Laterality: N/A;   HOT  HEMOSTASIS N/A 10/03/2023   Procedure: EGD, WITH ARGON PLASMA COAGULATION;  Surgeon: San Sandor GAILS, DO;  Location: [facility];  Service: Gastroenterology;  Laterality: N/A;   HOT HEMOSTASIS N/A 10/14/2023   Procedure: EGD, WITH ARGON PLASMA COAGULATION;  Surgeon: Leigh Elspeth SQUIBB, MD;  Location: [facility] ENDOSCOPY;  Service: Gastroenterology;  Laterality: N/A;   LYMPHADENECTOMY Bilateral 02/27/2014   Procedure: BILATERAL LYMPHADENECTOMY;  Surgeon: Ricardo Likens, MD;  Location: [facility];  Service: Urology;  Laterality: Bilateral;   POLYPECTOMY  05/23/2020   Procedure: POLYPECTOMY;  Surgeon: Charlanne Groom, MD;  Location: [facility] ENDOS[facility];  Service: Gastroenterology;;   PROSTATE BIOPSY  12/2013   Gleason 4+3=7, volume 31.31 cc   ROBOT ASSISTED LAPAROSCOPIC RADICAL PROSTATECTOMY N/A 02/27/2014   Procedure: ROBOTIC ASSISTED LAPAROSCOPIC RADICAL PROSTATECTOMY WITH INDOCYANINE GREEN DYE;  Surgeon: Ricardo Likens, MD;  Location: [facility];  Service: Urology;  Laterality: N/A;   Social:  Lives: [facility] Support: nephew listed as contact Fuller Makin) Level of Function: States he is able to perform ADLs independently.  Lives at SNF.  Last hospitalization documented needs assistance with IADLs and uses rolling walker for ambulation. PCP: [name] King, Viktoria, DO  Substances: -Tobacco: denies -Alcohol: denies -Recreational Drug: denies  Family History:  Family History  Problem  Relation Age of Onset   Heart disease Mother    Heart attack Father 40   Cancer Sister        breast   Colon cancer Neg Hx    Esophageal cancer Neg Hx    Rectal cancer Neg Hx    Stomach cancer Neg Hx      Allergies: Allergies as of 01/04/2024 - Review Complete 01/04/2024  Allergen Reaction Noted   Penicillins Other (See Comments) 05/16/2011    Review of Systems: A complete ROS was negative except as per HPI.   OBJECTIVE:   Physical Exam: Blood pressure (!) 166/86, pulse 90, temperature 98 F (36.7 C), temperature  source Oral, resp. rate 15, height 6' (1.829 m), weight 66 kg, SpO2 100%.  Physical Exam Cardiovascular:     Rate and Rhythm: Normal rate and regular rhythm.     Pulses:          Radial pulses are 2+ on the left side.     Heart sounds: Normal heart sounds.  Pulmonary:     Effort: Pulmonary effort is normal.     Breath sounds: Normal breath sounds. No stridor. No wheezing, rhonchi or rales.  Abdominal:     General: Abdomen is flat. Bowel sounds are normal. There is no distension.     Palpations: Abdomen is soft. There is no mass.     Tenderness: There is no abdominal tenderness. There is no guarding or rebound.     Hernia: No hernia is present.  Musculoskeletal:     Right lower leg: No edema.     Left lower leg: No edema.  Skin:    General: Skin is warm and dry.  Neurological:     Mental Status: He is alert.     Cranial Nerves: Dysarthria present.     Comments: Oriented x 2 to self and place      Labs: CBC    Component Value Date/Time   WBC 6.3 01/04/2024 2211   RBC 1.89 (L) 01/04/2024 2211   RBC 1.88 (L) 01/04/2024 2211   HGB 5.4 (LL) 01/04/2024 2350   HGB 10.6 (L) 10/14/2022 1016   HCT 16.0 (L) 01/04/2024 2350   HCT 32.2 (L) 10/14/2022 1016   PLT 140 (L) 01/04/2024 2211   PLT 166 10/14/2022 1016   MCV 98.4 01/04/2024 2211   MCV 93 10/14/2022 1016   MCH 30.9 01/04/2024 2211   MCHC 31.4 01/04/2024 2211   RDW 13.9 01/04/2024 2211   RDW 11.9 10/14/2022 1016   LYMPHSABS 0.9 01/04/2024 2211   MONOABS 0.4 01/04/2024 2211   EOSABS 0.2 01/04/2024 2211   BASOSABS 0.0 01/04/2024 2211     CMP     Component Value Date/Time   NA 142 01/04/2024 2350   NA 140 10/14/2022 1016   K 4.6 01/04/2024 2350   CL 110 01/04/2024 2211   CO2 18 (L) 01/04/2024 2211   GLUCOSE 401 (H) 01/04/2024 2211   BUN 78 (H) 01/04/2024 2211   BUN 39 (H) 10/14/2022 1016   CREATININE 4.36 (H) 01/04/2024 2211   CREATININE 1.52 (H) 08/28/2020 1427   CALCIUM  8.3 (L) 01/04/2024 2211   PROT 7.2  01/04/2024 2211   PROT 7.4 10/14/2022 1016   ALBUMIN 2.5 (L) 01/04/2024 2211   ALBUMIN 4.0 10/14/2022 1016   AST 18 01/04/2024 2211   ALT 14 01/04/2024 2211   ALKPHOS 56 01/04/2024 2211   BILITOT 0.4 01/04/2024 2211   BILITOT <0.2 10/14/2022 1016   GFRNONAA 13 (L)  01/04/2024 2211   GFRNONAA 45 (L) 08/28/2020 1427   GFRAA 52 (L) 08/28/2020 1427    Imaging: No results found.   ASSESSMENT & PLAN:   Assessment & Plan by Problem: Principal Problem:   Acute on chronic blood loss anemia Active Problems:   History of CVA (cerebrovascular accident)   Hyperlipidemia associated with type 2 diabetes mellitus (HCC)   Iron  deficiency anemia   Anemia due to chronic kidney disease   Chronic active hepatitis C (HCC)   Cerebrovascular accident (CVA) (HCC)   Essential hypertension   AVM (arteriovenous malformation) of small bowel, acquired   Gregory Beirne. is a 78 y.o. person living with a history of chronic duodenal AVM, anemia of chronic disease, chronic thrombocytopenia vascular dementia, CKD 5, history of ischemic CVA with residual dysarthria, HTN, chronic HCV who presented with hemoglobin 5.8 and admitted for acute on chronic blood loss anemia on hospital day 0  Chronic Duodenal AVM Acute on Chronic Anemia d/t GI bleeding  Anemia of Chronic Disease Presented with hemoglobin 5.8 and asymptomatic denying dizziness, lightheadedness, weakness.  Denies blood in stool or melena.  FOBT+. Previous push enteroscopy on 10/14/2023 showed multiple AVMs in duodenum that were treated with argon plasma coagulation.  GI attempted PillCam after last hospitalization 11/28/2023 but there is no documented results that I was able to obtain.  Patient was lost to follow-up outpatient.  CKD5 could be contributing to anemia, however this is likely due to the AVMs.  Hemodynamically stable BP 174/91, regular rate, 2+ radial pulse.  Last dose of octreotide  12/27/2023 (recommended from last hospitalization every 28  days).  2 units of blood have been ordered in the ED but on exam he had not yet received. - ED ordered 2 units packed red blood cells -Trend H&H -Transfuse again if hemoglobin less than 7 - Stop ASA - Consult GI in a.m. - Continue oral Protonix  40 mg twice daily  History of Ischemic CVA with residual dysarthria  Vascular Dementia Ischemic stroke documented 09/21/2023 with infarcts in left thalamus and parietal white matter.  Patient oriented to self and place but not year or month.  Patient believes that he lives at home across the street from the hospital and is checked in on by his aunt 3 times a month.  Dysarthria present on physical exam.  Patient takes aspirin  and atorvastatin . - Stop aspirin  and setting of acute bleed - Continue atorvastatin  80 mg daily  Hypertension Patient was hypertensive on admission with last documented blood pressure at 174/91.  Home medications include amlodipine  10 mg and hydralazine  25 mg 3 times daily. - Amlodipine  and hydralazine  - Monitor blood pressure  T2DM (controlled) Glucose 400 on admit.  Last documented A1c on 09/22/2023 was 5.1.  Last documented elevated A1c was 08/14/2022 at 5.8.  No home medications listed. - Sliding scale ordered  Chronic Thrombocytopenia (stable) Chronic Iron  Deficiency Anemia Platelets 140 on admission.  Last admission platelets increased to 157 before discharge.  Likely from chronic kidney disease or iron  deficiency anemia. Iron  Deficit 928 calculated with Ganzoni equation. Fe 31, %saturation 11, ferritin 26. -Continue to trend  -consider IV iron  (would need 500 mg additional Fe IV after 2 U blood)  CKD5 Patient still producing urine.  BUN 78, creatinine 4.36, GFR 13.  Relatively stable since last hospitalization. Could be contributing to anemia  - Continue to monitor BMP  Chronic HCV HCV antibody reactive 11/24/2023.  Currently taking MAVYRET  100 to 48 mg x 3 tablets daily -LFTs  normalized from previous admission     Other medications ordered - Melatonin 3 mg tablet - Thiamine  100 mg  Best practice: Diet: heart VTE: SCDs in setting of acute bleed  Code: Full  Disposition planning: Prior to Admission Living Arrangement: [facility] Anticipated Discharge Location: [facility]  Dispo: Admit patient to Observation with expected length of stay less than 2 midnights.  Signed: Benuel Braun, DO Internal Medicine Resident  01/05/2024, 1:27 AM  On Call pager: [phone]

## 2024-01-04 NOTE — ED Provider Notes (Addendum)
 [place] EMERGENCY DEPARTMENT AT [facility] Provider Note   CSN: [id number] Arrival date & time: 01/04/24  2131     Patient presents with: Abnormal labs   Gregory Nunez. is a 78 y.o. male.   78 year old male brought to the emergency department for evaluation of some abnormal labs.  EMS brought the patient from [place] [facility].  Patient has a history of dementia.  He is alert and only oriented to himself.  He denies any rectal bleeding, abdominal pain, chest pain, or shortness of breath. Patient reportedly has a hemoglobin of 5.7 Chart review shows recent discharge from the hospital on 11/28/2023 for GI bleeding and acute on chronic anemia secondary to GI bleeding.  His medical history also includes chronic HCV infection, ischemic CVA, dysarthria, hypertension, and diabetes.  The history is provided by the EMS personnel. History limited by: Patient's dementia.       Prior to Admission medications   Medication Sig Start Date End Date Taking? Authorizing Provider  amLODipine  (NORVASC ) 10 MG tablet Take 1 tablet (10 mg total) by mouth daily. 06/20/23   Jadine Toribio SQUIBB, MD  aspirin  EC 81 MG tablet Take 1 tablet (81 mg total) by mouth daily. Swallow whole. Patient taking differently: Take 81 mg by mouth every evening. Swallow whole. 09/25/23   Tawkaliyar, Roya, DO  atorvastatin  (LIPITOR ) 80 MG tablet Take 1 tablet (80 mg total) by mouth daily. Patient taking differently: Take 80 mg by mouth every evening. 09/25/23   Tawkaliyar, Roya, DO  Glecaprevir -Pibrentasvir  (MAVYRET ) 100-40 MG TABS Take 3 tablets by mouth daily. 11/28/23 01/23/24  Jolaine Pac, DO  Glecaprevir -Pibrentasvir  (MAVYRET ) 100-40 MG TABS Take 3 tablet by mouth once a day 12/03/23     hydrALAZINE  (APRESOLINE ) 25 MG tablet Take 1 tablet (25 mg total) by mouth every 8 (eight) hours. 10/14/22   Amponsah, Prosper M, MD  melatonin 3 MG TABS tablet Take 1 tablet (3 mg total) by mouth at bedtime as needed.  11/28/23   Jolaine Pac, DO  multivitamin (PROSIGHT) TABS tablet Take 1 tablet by mouth daily. 09/25/23   Tawkaliyar, Roya, DO  Nutritional Supplements (NUTRITIONAL DRINK) LIQD Take 90 mLs by mouth in the morning, at noon, and at bedtime. Med Plus 2.0    [provider]  octreotide  (SANDOSTATIN  LAR) 20 MG injection Inject 20 mg into the muscle every 28 (twenty-eight) days. 10/17/23   Elnora Ip, MD  pantoprazole  (PROTONIX ) 40 MG tablet Take 1 tablet (40 mg total) by mouth 2 (two) times daily. 10/17/23   Elnora Ip, MD  sennosides-docusate sodium  (SENOKOT-S) 8.6-50 MG tablet Take 1 tablet by mouth daily.    [provider]  thiamine  (VITAMIN B-1) 100 MG tablet Take 1 tablet (100 mg total) by mouth daily. 10/17/23   Elnora Ip, MD  COMBIVENT RESPIMAT 20-100 MCG/ACT AERS respimat Inhale 1 puff into the lungs every 6 (six) hours as needed for shortness of breath. 06/12/18 08/19/19  [provider]  mometasone -formoterol  (DULERA) 100-5 MCG/ACT AERO Inhale 2 puffs into the lungs daily. Patient not taking: Reported on 08/19/2019 04/17/18 08/19/19  Rosario Eland I, MD  sucralfate  (CARAFATE ) 1 g tablet Take 1 tablet (1 g total) by mouth 4 (four) times daily. Patient not taking: Reported on 08/19/2019 04/17/18 08/19/19  Rosario Eland I, MD  tiotropium (SPIRIVA  HANDIHALER) 18 MCG inhalation capsule Place 1 capsule (18 mcg total) into inhaler and inhale daily. Patient not taking: Reported on 08/19/2019 04/17/18 08/19/19  Rosario Eland FERNS, MD  traZODone  (DESYREL ) 50 MG tablet Take 50 mg by mouth at bedtime as needed for sleep. Patient not taking: Reported on 10/06/2020  10/08/20  [provider]    Allergies: Penicillins    Review of Systems  All other systems reviewed and are negative.   Updated Vital Signs BP (!) 166/86 (BP Location: Right Arm)   Pulse 90   Temp 98 F (36.7 C) (Oral)   Resp 15   Ht 6' (1.829 m)   Wt 66 kg   SpO2 100%    BMI 19.73 kg/m   Physical Exam Vitals and nursing note reviewed.  Constitutional:      General: He is not in acute distress. HENT:     Mouth/Throat:     Mouth: Mucous membranes are moist.  Eyes:     Conjunctiva/sclera: Conjunctivae normal.  Cardiovascular:     Rate and Rhythm: Normal rate.     Pulses: Normal pulses.  Pulmonary:     Effort: Pulmonary effort is normal.     Breath sounds: Normal breath sounds.  Abdominal:     General: Abdomen is flat.     Palpations: Abdomen is soft.  Genitourinary:    Rectum: Normal.  Skin:    General: Skin is warm.  Neurological:     Mental Status: He is alert. Mental status is at baseline.     (all labs ordered are listed, but only abnormal results are displayed) Labs Reviewed  RETICULOCYTES - Abnormal; Notable for the following components:      Result Value   RBC. 1.89 (*)    All other components within normal limits  COMPREHENSIVE METABOLIC PANEL WITH GFR - Abnormal; Notable for the following components:   CO2 18 (*)    Glucose, Bld 401 (*)    BUN 78 (*)    Creatinine, Ser 4.36 (*)    Calcium  8.3 (*)    Albumin 2.5 (*)    GFR, Estimated 13 (*)    All other components within normal limits  CBC WITH DIFFERENTIAL/PLATELET - Abnormal; Notable for the following components:   RBC 1.88 (*)    Hemoglobin 5.8 (*)    HCT 18.5 (*)    Platelets 140 (*)    All other components within normal limits  POC OCCULT BLOOD, ED - Abnormal; Notable for the following components:   Fecal Occult Bld POSITIVE (*)    All other components within normal limits  PROTIME-INR  VITAMIN B12  FOLATE  IRON  AND TIBC  FERRITIN  I-STAT VENOUS BLOOD GAS, ED  TYPE AND SCREEN  PREPARE RBC (CROSSMATCH)    EKG: None  Radiology: No results found.   .Critical Care  Performed by: Shyra Emile, DO Authorized by: Brityn Mastrogiovanni, DO   Critical care provider statement:    Critical care time (minutes):  35   Critical care was necessary to treat or prevent  imminent or life-threatening deterioration of the following conditions:  Circulatory failure   Critical care was time spent personally by me on the following activities:  Development of treatment plan with patient or surrogate, blood draw for specimens, examination of patient, obtaining history from patient or surrogate, re-evaluation of patient's condition, ordering and review of radiographic studies, ordering and review of laboratory studies and ordering and performing treatments and interventions    Medications Ordered in the ED  0.9 %  sodium chloride  infusion (Manually program via Guardrails IV Fluids) (has no administration in time range)  lactated ringers  bolus 1,000 mL (1,000 mLs Intravenous New  Bag/Given 01/04/24 2210)                                    Medical Decision Making 78 year old male brought to the emergency department for evaluation of a low hemoglobin.  He is well-appearing but has a known baseline dementia with limited history provided.  He is denying any known GI bleeding but was discharged from the hospital 1 month ago after stay for anemia secondary to a GI bleed.  He has a history of duodenal AVM and this has been evaluated for possible source of bleeding.  He has seen GI during his recent stay. Differential includes but not limited to upper GI bleed, lower GI bleed, iron  deficiency anemia, and others. We proceeded with rechecking of the hemoglobin and running basic labs. His hemoglobin was confirmed to be 5.8.  We have ordered 2 units here in the ED to start transfusion. Patient has a history of CKD stage V we were not able to proceed with a CTA of the abdomen/pelvis. Patient will be admitted for blood transfusion and GI consultation.  Amount and/or Complexity of Data Reviewed Labs: ordered. Radiology: ordered.  Risk Prescription drug management.    Final diagnoses:  Anemia, unspecified type  Gastrointestinal hemorrhage, unspecified gastrointestinal hemorrhage  type    ED Discharge Orders     None         Taquanna Borras, DO 01/04/24 2307

## 2024-01-05 DIAGNOSIS — D649 Anemia, unspecified: Secondary | ICD-10-CM

## 2024-01-05 DIAGNOSIS — K5520 Angiodysplasia of colon without hemorrhage: Secondary | ICD-10-CM

## 2024-01-05 DIAGNOSIS — N183 Chronic kidney disease, stage 3 unspecified: Secondary | ICD-10-CM

## 2024-01-05 DIAGNOSIS — D509 Iron deficiency anemia, unspecified: Secondary | ICD-10-CM | POA: Diagnosis not present

## 2024-01-05 DIAGNOSIS — D62 Acute posthemorrhagic anemia: Secondary | ICD-10-CM | POA: Diagnosis not present

## 2024-01-05 DIAGNOSIS — F0150 Vascular dementia without behavioral disturbance: Secondary | ICD-10-CM | POA: Diagnosis not present

## 2024-01-05 DIAGNOSIS — Z8673 Personal history of transient ischemic attack (TIA), and cerebral infarction without residual deficits: Secondary | ICD-10-CM

## 2024-01-05 DIAGNOSIS — D631 Anemia in chronic kidney disease: Secondary | ICD-10-CM

## 2024-01-05 DIAGNOSIS — K922 Gastrointestinal hemorrhage, unspecified: Secondary | ICD-10-CM | POA: Diagnosis not present

## 2024-01-05 DIAGNOSIS — Q2733 Arteriovenous malformation of digestive system vessel: Secondary | ICD-10-CM | POA: Diagnosis not present

## 2024-01-05 LAB — CBG MONITORING, ED
Glucose-Capillary: 174 mg/dL — ABNORMAL HIGH (ref 70–99)
Glucose-Capillary: 269 mg/dL — ABNORMAL HIGH (ref 70–99)
Glucose-Capillary: 380 mg/dL — ABNORMAL HIGH (ref 70–99)

## 2024-01-05 LAB — BASIC METABOLIC PANEL WITH GFR
Anion gap: 16 — ABNORMAL HIGH (ref 5–15)
BUN: 67 mg/dL — ABNORMAL HIGH (ref 8–23)
CO2: 19 mmol/L — ABNORMAL LOW (ref 22–32)
Calcium: 8.5 mg/dL — ABNORMAL LOW (ref 8.9–10.3)
Chloride: 105 mmol/L (ref 98–111)
Creatinine, Ser: 3.77 mg/dL — ABNORMAL HIGH (ref 0.61–1.24)
GFR, Estimated: 16 mL/min — ABNORMAL LOW (ref >60)
Glucose, Bld: 194 mg/dL — ABNORMAL HIGH (ref 70–99)
Potassium: 4.1 mmol/L (ref 3.5–5.1)
Sodium: 140 mmol/L (ref 135–145)

## 2024-01-05 LAB — HEMOGLOBIN AND HEMATOCRIT, BLOOD
HCT: 24.8 % — ABNORMAL LOW (ref 39.0–52.0)
Hemoglobin: 8.5 g/dL — ABNORMAL LOW (ref 13.0–17.0)

## 2024-01-05 LAB — GLUCOSE, CAPILLARY
Glucose-Capillary: 254 mg/dL — ABNORMAL HIGH (ref 70–99)
Glucose-Capillary: 208 mg/dL — ABNORMAL HIGH (ref 70–99)
Glucose-Capillary: 95 mg/dL (ref 70–99)

## 2024-01-05 MED ORDER — SENNOSIDES-DOCUSATE SODIUM 8.6-50 MG PO TABS: 1.0000 | ORAL_TABLET | Freq: Every evening | ORAL | Status: DC | PRN

## 2024-01-05 MED ORDER — HYDRALAZINE HCL 25 MG PO TABS
25.0000 mg | ORAL_TABLET | Freq: Three times a day (TID) | ORAL | Status: DC
  Administered 2024-01-05 – 2024-01-06 (×6): 25 mg via ORAL
  Filled 2024-01-05 (×6): qty 1

## 2024-01-05 MED ORDER — GLECAPREVIR-PIBRENTASVIR 100-40 MG PO TABS: 3.0000 | ORAL_TABLET | Freq: Every day | ORAL | Status: DC

## 2024-01-05 MED ORDER — ACETAMINOPHEN 325 MG PO TABS: 650.0000 mg | ORAL_TABLET | Freq: Four times a day (QID) | ORAL | Status: DC | PRN

## 2024-01-05 MED ORDER — MELATONIN 3 MG PO TABS
3.0000 mg | ORAL_TABLET | Freq: Every evening | ORAL | Status: DC | PRN
Start: 2024-01-05 — End: 2024-01-07

## 2024-01-05 MED ORDER — ACETAMINOPHEN 650 MG RE SUPP: 650.0000 mg | Freq: Four times a day (QID) | RECTAL | Status: DC | PRN

## 2024-01-05 MED ORDER — INSULIN ASPART 100 UNIT/ML IJ SOLN
0.0000 [IU] | Freq: Three times a day (TID) | INTRAMUSCULAR | Status: DC
  Administered 2024-01-05: 2 [IU] via SUBCUTANEOUS
  Administered 2024-01-05: 3 [IU] via SUBCUTANEOUS
  Administered 2024-01-05: 1 [IU] via SUBCUTANEOUS

## 2024-01-05 MED ORDER — PANTOPRAZOLE SODIUM 40 MG PO TBEC
40.0000 mg | DELAYED_RELEASE_TABLET | Freq: Two times a day (BID) | ORAL | Status: DC
  Administered 2024-01-05 – 2024-01-06 (×4): 40 mg via ORAL
  Filled 2024-01-05 (×4): qty 1

## 2024-01-05 MED ORDER — ATORVASTATIN CALCIUM 80 MG PO TABS
80.0000 mg | ORAL_TABLET | Freq: Every evening | ORAL | Status: DC
  Administered 2024-01-05 – 2024-01-06 (×2): 80 mg via ORAL
  Filled 2024-01-05 (×2): qty 1

## 2024-01-05 MED ORDER — AMLODIPINE BESYLATE 10 MG PO TABS
10.0000 mg | ORAL_TABLET | Freq: Every day | ORAL | Status: DC
  Administered 2024-01-05 – 2024-01-06 (×2): 10 mg via ORAL
  Filled 2024-01-05: qty 1
  Filled 2024-01-05: qty 2

## 2024-01-05 MED ORDER — THIAMINE MONONITRATE 100 MG PO TABS
100.0000 mg | ORAL_TABLET | Freq: Every day | ORAL | Status: DC
  Administered 2024-01-05 – 2024-01-06 (×2): 100 mg via ORAL
  Filled 2024-01-05 (×2): qty 1

## 2024-01-05 NOTE — NC FL2 (Signed)
 [place]  MEDICAID FL2 LEVEL OF CARE FORM     IDENTIFICATION  Patient Name: Gregory Nunez. Birthdate: 06/17/1945 Sex: male Admission Date (Current Location): 01/04/2024  [facility] and [place] Number:  Producer, television/film/video and Address:  [facility] [place], [address], [place], [place] [address]      Provider Number: [id number]  Attending Physician Name and Address:  Francesco Elsie NOVAK, MD  Relative Name and Phone Number:  Khoen Genet; Nephew; [phone]    Current Level of Care: SNF Recommended Level of Care: Skilled Nursing Facility Prior Approval Number:    Date Approved/Denied:   PASRR Number: [id number]  Discharge Plan: SNF    Current Diagnoses: Patient Active Problem List   Diagnosis Date Noted   GI bleeding 11/23/2023   Blood loss anemia 10/18/2023   Acute on chronic anemia 10/15/2023   Protein-calorie malnutrition (HCC) 10/14/2023   Thiamine  deficiency 10/14/2023   Antiplatelet or antithrombotic long-term use 10/14/2023   Upper GI bleeding 10/14/2023   Recent cerebrovascular accident (CVA) 10/14/2023   Gastritis and gastroduodenitis 10/03/2023   Duodenal erosion 10/03/2023   AVM (arteriovenous malformation) of small bowel, 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   GI bleed 10/01/2023   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   Cerebrovascular accident (CVA) (HCC) 09/22/2023   Encephalopathy 09/22/2023   Aphasia 09/22/2023   Neutropenia (HCC) 09/22/2023   Stage 4 chronic kidney disease (HCC) 09/22/2023   Essential hypertension 09/22/2023   Confusion with non-focal neuro exam 08/17/2023   Acute on chronic blood loss anemia 06/13/2023   Chronic active hepatitis C (HCC) 11/03/2022   History of epistaxis 12/04/2021   Debility 04/15/2021   History of alcohol abuse 04/13/2021   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/12/2021   Weakness of both legs    Adenomatous duodenal polyp    AVM (arteriovenous malformation) of duodenum,  acquired with hemorrhage    Angiodysplasia of intestine    Iron  deficiency anemia 05/22/2020   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 (HCC)    History of CVA (cerebrovascular accident) 04/23/2020   Hyperlipidemia associated with type 2 diabetes mellitus (HCC) 04/23/2020   CKD (chronic kidney disease) stage 4, GFR 15-29 ml/min (HCC) 04/23/2020   Tobacco use 04/23/2020   Seizure disorder (HCC) 11/08/2018   Hypertension associated with diabetes (HCC) 11/08/2018   Type 2 diabetes with complication (HCC) 11/08/2018   Prostate cancer (HCC) 02/27/2014    Orientation RESPIRATION BLADDER Height & Weight     Self  Normal (Room Air) Continent Weight: 145 lb 8.1 oz (66 kg) Height:  6' (182.9 cm)  BEHAVIORAL SYMPTOMS/MOOD NEUROLOGICAL BOWEL NUTRITION STATUS    Convulsions/Seizures (Seizure Disorder) Continent Diet (Please see discharge summary)  AMBULATORY STATUS COMMUNICATION OF NEEDS Skin   Extensive Assist Verbally Normal                       Personal Care Assistance Level of Assistance  Bathing, Dressing, Feeding Bathing Assistance: Maximum assistance Feeding assistance: Maximum assistance Dressing Assistance: Maximum assistance     Functional Limitations Info             SPECIAL CARE FACTORS FREQUENCY  PT (By licensed PT), OT (By licensed OT)     PT Frequency: 5x OT Frequency: 5x            Contractures Contractures Info: Not present    Additional Factors Info  Code Status, Allergies, Insulin  Sliding Scale Code Status Info: Full Code Allergies Info: Penicillins  Insulin  Sliding Scale Info: Please see discharge summary       Current Medications (01/05/2024):  This is the current hospital active medication list Current Facility-Administered Medications  Medication Dose Route Frequency Provider Last Rate Last Admin   acetaminophen  (TYLENOL ) tablet 650 mg  650 mg Oral Q6H PRN Zheng, Michael, DO       Or   acetaminophen  (TYLENOL )  suppository 650 mg  650 mg Rectal Q6H PRN Zheng, Michael, DO       amLODipine  (NORVASC ) tablet 10 mg  10 mg Oral Daily Zheng, Michael, DO   10 mg at 01/05/24 1118   atorvastatin  (LIPITOR ) tablet 80 mg  80 mg Oral QPM Zheng, Michael, DO       Glecaprevir -Pibrentasvir  100-40 MG TABS 3 tablet  3 tablet Oral Daily Zheng, Michael, DO       hydrALAZINE  (APRESOLINE ) tablet 25 mg  25 mg Oral Q8H Zheng, Michael, DO   25 mg at 01/05/24 9381   insulin  aspart (novoLOG ) injection 0-6 Units  0-6 Units Subcutaneous TID WC Zheng, Michael, DO   3 Units at 01/05/24 1329   melatonin tablet 3 mg  3 mg Oral QHS PRN Zheng, Michael, DO       pantoprazole  (PROTONIX ) EC tablet 40 mg  40 mg Oral BID Zheng, Michael, DO   40 mg at 01/05/24 1118   senna-docusate (Senokot-S) tablet 1 tablet  1 tablet Oral QHS PRN Zheng, Michael, DO       thiamine  (VITAMIN B1) tablet 100 mg  100 mg Oral Daily Zheng, Michael, DO   100 mg at 01/05/24 1118     Discharge Medications: Please see discharge summary for a list of discharge medications.  Relevant Imaging Results:  Relevant Lab Results:   Additional Information SSN: [id number]  Lauraine FORBES Saa, LCSWA

## 2024-01-05 NOTE — Evaluation (Signed)
 Physical Therapy Evaluation Patient Details Name: Gregory Nunez. MRN: [id number] DOB: [birth year] Today's Date: 01/05/2024  History of Present Illness  Pt is a 78 y/o M admitted on 01/04/24 after presenting with c/o abnormal labs, Hgb 5.7. Pt is being treated for acute on chronic anemia 2/2 GI bleeding. PMH: chronic duodenal AVM, anemia of chronic disease, chronic thrombocytopenia, vascular dementia, CKD5, ischemic CVA with residual dysarthria, HTN, HCV  Clinical Impression  Pt seen for PT evaluation with pt agreeable. Pt is pleasantly confused but AxOx3 during session. Pt presents with decreased safety awareness, decreased overall awareness. Pt requires min +2 increasing to max assist +2 for gait around bed with RW. Pt would benefit from ongoing PT services while in acute setting to increase independence to decrease caregiver burden & reduce fall risk with mobility.        If plan is discharge home, recommend the following: A lot of help with walking and/or transfers;A lot of help with bathing/dressing/bathroom   Can travel by private vehicle        Equipment Recommendations Other (comment) (defer to facility)  Recommendations for Other Services       Functional Status Assessment Patient has had a recent decline in their functional status and demonstrates the ability to make significant improvements in function in a reasonable and predictable amount of time.     Precautions / Restrictions Precautions Precautions: Fall Restrictions Weight Bearing Restrictions Per Provider Order: No      Mobility  Bed Mobility Overal bed mobility: Needs Assistance Bed Mobility: Sit to Supine, Supine to Sit     Supine to sit: Mod assist Sit to supine: Min assist   General bed mobility comments: from ED stretcher    Transfers Overall transfer level: Needs assistance Equipment used: Rolling walker (2 wheels) Transfers: Sit to/from Stand Sit to Stand: Min assist            General transfer comment: cuing re: hand placement    Ambulation/Gait Ambulation/Gait assistance: Min assist, +2 physical assistance, Max assist, Mod assist Gait Distance (Feet): 10 Feet Assistive device: Rolling walker (2 wheels) Gait Pattern/deviations: Decreased dorsiflexion - right, Decreased dorsiflexion - left, Decreased step length - left, Decreased step length - right, Decreased stride length Gait velocity: decreased     General Gait Details: Pt ambulates around stretcher with RW & min assist +2 increasing to max assist +2 as pt with c/o fatigue. Pt with decreased step length BLE, absent RLE dorsiflexion during swing phase, absent RLE heel strike, cuing re: hand placement on RW as pt attempting to move LUE to front bar at times, assistance with weight shifting, cuing for stepping, increased assistance with RW management.  Stairs            Wheelchair Mobility     Tilt Bed    Modified Rankin (Stroke Patients Only)       Balance Overall balance assessment: Needs assistance Sitting-balance support: Feet supported       Standing balance support: Bilateral upper extremity supported, During functional activity, Reliant on assistive device for balance Standing balance-Leahy Scale: Poor                               Pertinent Vitals/Pain Pain Assessment Pain Assessment: Faces Faces Pain Scale: Hurts a little bit Pain Location: generalized with movement, R thigh Pain Descriptors / Indicators: Grimacing, Discomfort Pain Intervention(s): Limited activity within patient's tolerance, Monitored during session  Home Living Family/patient expects to be discharged to:: Skilled nursing facility                   Additional Comments: No family present today. information taken from previous chart. Pt from [facility].    Prior Function Prior Level of Function : Patient poor historian/Family not available;History of Falls (last six  months)             Mobility Comments: Pt reports using a SPC or RW, no WC use and states he walks ADLs Comments: reports mod I for most tasks, staff assists with bathing. unsure of accuracy     Extremity/Trunk Assessment   Upper Extremity Assessment Upper Extremity Assessment: Defer to OT evaluation RUE Deficits / Details: using functionally but needed assist to grasp RW LUE Deficits / Details: using functionally    Lower Extremity Assessment Lower Extremity Assessment: Generalized weakness;RLE deficits/detail RLE Deficits / Details: hx of CVA with residual RLE weakness, foot drop during gait. Not formally assessed.    Cervical / Trunk Assessment Cervical / Trunk Assessment: Kyphotic  Communication   Communication Communication: Impaired Factors Affecting Communication: Difficulty expressing self;Reduced clarity of speech    Cognition Arousal: Alert Behavior During Therapy: WFL for tasks assessed/performed   PT - Cognitive impairments: History of cognitive impairments                       PT - Cognition Comments: Pleasantly confused throughout session. Oriented to self, place, situation. Following commands: Impaired Following commands impaired: Only follows one step commands consistently, Follows multi-step commands inconsistently     Cueing Cueing Techniques: Verbal cues     General Comments General comments (skin integrity, edema, etc.): VSS on RA    Exercises     Assessment/Plan    PT Assessment Patient needs continued PT services  PT Problem List Decreased strength;Decreased range of motion;Decreased activity tolerance;Decreased balance;Decreased mobility;Decreased knowledge of precautions;Decreased safety awareness;Decreased knowledge of use of DME;Decreased cognition       PT Treatment Interventions Balance training;DME instruction;Therapeutic activities;Neuromuscular re-education;Gait training;Patient/family education;Functional mobility  training;Therapeutic exercise    PT Goals (Current goals can be found in the Care Plan section)  Acute Rehab PT Goals PT Goal Formulation: Patient unable to participate in goal setting Time For Goal Achievement: 01/19/24 Potential to Achieve Goals: Fair    Frequency Min 2X/week     Co-evaluation PT/OT/SLP Co-Evaluation/Treatment: Yes Reason for Co-Treatment: Complexity of the patient's impairments (multi-system involvement);For patient/therapist safety;To address functional/ADL transfers PT goals addressed during session: Mobility/safety with mobility;Balance;Proper use of DME         AM-PAC PT 6 Clicks Mobility  Outcome Measure Help needed turning from your back to your side while in a flat bed without using bedrails?: A Little Help needed moving from lying on your back to sitting on the side of a flat bed without using bedrails?: A Lot Help needed moving to and from a bed to a chair (including a wheelchair)?: A Lot Help needed standing up from a chair using your arms (e.g., wheelchair or bedside chair)?: A Lot Help needed to walk in hospital room?: Total Help needed climbing 3-5 steps with a railing? : Total 6 Click Score: 11    End of Session Equipment Utilized During Treatment: Gait belt Activity Tolerance: Patient limited by fatigue Patient left: in bed;with call bell/phone within reach Nurse Communication: Mobility status PT Visit Diagnosis: Muscle weakness (generalized) (M62.81);Other abnormalities of gait and mobility (R26.89);Unsteadiness on  feet (R26.81);Difficulty in walking, not elsewhere classified (R26.2)    Time: 9061-9044 PT Time Calculation (min) (ACUTE ONLY): 17 min   Charges:   PT Evaluation $PT Eval Moderate Complexity: 1 Mod   PT General Charges $$ ACUTE PT VISIT: 1 Visit         Richerd Pinal, PT, DPT 01/05/24, 1:08 PM   Richerd CHRISTELLA Pinal 01/05/2024, 1:07 PM

## 2024-01-05 NOTE — Care Management (Signed)
 Transition of Care [name]) - Inpatient Brief Assessment   Patient Details  Name: Jeffey Janssen. MRN: [id number] Date of Birth: [birth year]  Transition of Care [facility]) CM/SW Contact:    Corean JAYSON Canary, RN Phone Number: 01/05/2024, 10:23 AM   Clinical Narrative:  78 year old with a history of Chronic duodenal AVM, CKD presents with weakness from [facility] He is confused at baseline. He has a low blood count requiring transfusion. Called nephew Kaicen Desena for collateral information and MOON. Left message to return call to discuss Will go back to [facility] upon discharge Transition of Care Asessment: Insurance and Status: Insurance coverage has been reviewed Patient has primary care physician: Yes Home environment has been reviewed: Apartment Prior level of function:: Independent Prior/Current Home Services: No current home services (No listing in [place]) Social Drivers of Health Review: SDOH reviewed needs interventions (For social isolation) Readmission risk has been reviewed: Yes Transition of care needs: transition of care needs identified, TOC will continue to follow

## 2024-01-05 NOTE — ED Notes (Signed)
 Entered room to check on status of blood transfusion. Upon entering room pt noted to have removed both PIVs, and was sitting in a pool of blood from the transfusion still infusing. Pt reports removing the IVs, because I thought it was the right thing to do. Pt pleasant and cooperative however confused. Pt linen changed, new PIV established, blood transfusion resumed. Estimated blood loss from IV removal about 75mL. Safety mitts applied to pt's hands, circulation WDL. Room door remains open.

## 2024-01-05 NOTE — Consult Note (Addendum)
 Consultation Note   Referring Provider:   Teaching service  PCP: Charmayne Holmes, DO Primary Gastroenterologist:   Elspeth Naval, MD Reason for Consultation: Recurrent anemia DOA: 01/04/2024         Hospital Day: 2   ASSESSMENT    78 year old male with multiple admissions for acute on chronic anemia secondary to small bowel angioectasia .  He is treated with Sandostatin  . Admitted now with another acute drop in hemoglobin  / FOBT+ on Plavix  .  Suspect recurrent AVM bleeding Last dose of Sandostatin  reportedly 9/9 ( gets Q 28 days) Hemoglobin improved to 8.5 post 2 units RBCs  History of CVA Seems he is no longer taking Plavix  though it is on his outpatient med list?   Vascular dementia  Chronic thrombocytopenia Platelets stable at 140.  There is a history of alcohol abuse but prior RUQ ultrasound without findings of cirrhosis and doppler with normal direction of blood flow toward liver. INR's have been normal. Hasn't had abdominal imaging so unknown if spleen enlarged.   CKD IV/V  DM2  Hypertension  HCV On Mavyret   History of colon polyps Two subcentimeter tubular adenomas removed in February 202  Cholelithiasis  See PMH for additional medical problems / history  Principal Problem:   Acute on chronic blood loss anemia Active Problems:   History of CVA (cerebrovascular accident)   Hyperlipidemia associated with type 2 diabetes mellitus (HCC)   Iron  deficiency anemia   Anemia due to chronic kidney disease   Chronic active hepatitis C (HCC)   Cerebrovascular accident (CVA) (HCC)   Essential hypertension   AVM (arteriovenous malformation) of small bowel, acquired   PLAN:   --Continue PO BID PPI --Trend H/H --? Repeat upper endoscopy and +/- reattempt at small bowel video capsule study.  Patient may need to have a sitter in the room during the video capsule study as he removed the equipment / leads the last time  we tried it.    --I don't feel patient is able to consent for endoscopic procedures. Reached out to admitting team to discuss - patient's Nephew ETTER Ozell Louder) makes his medical decisions.     HPI   Brief history  Mr. Kunert has had multiple admissions for GI bleed secondary to AVM disease found on multiple upper endoscopies.  He has had APC of angiodysplastic lesions on multiple occasions. He has been treated with Sandostatin  injections, multiple blood transfusions.  Most recently , he had 2 small bowel endoscopies in June of this year with ablation of multiple angioectasias of the small bowel .  Following that he was readmitted early August with a recurrent decline in hemoglobin.  Plan was for small bowel video capsule study.  Patient agreed to study but then he removed the camera leads and equipment about 1 hour into the study.  Hemoglobin when he left the hospital was 9.5.   Interval history: Patient brought from [facility] to ED yesterday for abnormal labs.  Hemoglobin was 5.7.  Unable to get history from Mr. Hatchel.  He has dementia and has also had a stroke and has dysarthria.  He cannot express his thoughts.  I asked him several times about whether he has been having any black stool  and he repeatedly says twice but cannot elaborate.  He is also unable to tell me where he lives.  Review of systems unreliable but denies any abdominal pain, nausea or vomiting.   Relevant workup thus far: Presenting hemoglobin 5.8 Ferritin 26 FOBT positive B12 814 Folate 7.7 Glucose on admission 401>>194 Creatinine 4.36>> 3.77   Labs and Imaging:  Recent Labs    01/04/24 2211  PROT 7.2  ALBUMIN 2.5*  AST 18  ALT 14  ALKPHOS 56  BILITOT 0.4   Recent Labs    01/04/24 2211 01/04/24 2350 01/05/24 0814  WBC 6.3  --   --   HGB 5.8* 5.4* 8.5*  HCT 18.5* 16.0* 24.8*  MCV 98.4  --   --   PLT 140*  --   --    Recent Labs    01/04/24 2211 01/04/24 2350 01/05/24 0617  NA 139  142 140  K 4.3 4.6 4.1  CL 110  --  105  CO2 18*  --  19*  GLUCOSE 401*  --  194*  BUN 78*  --  67*  CREATININE 4.36*  --  3.77*  CALCIUM  8.3*  --  8.5*    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)    Epilepsy (HCC)    GERD (gastroesophageal reflux disease)    if drinks alcohol   GI bleed 10/05/2020   HAV (hallux abducto valgus) 01/17/2013   Patient is approximately 5-week status post bunion correction left foot   Hyperlipidemia    Hypertension    Hypertensive retinopathy    OU   Hypoglycemia 10/22/2020   Malignant neoplasm of prostate (HCC) 01/09/2014   Neuropathy    Pancreatitis    Pneumococcal vaccination administered at current visit 11/10/2020   Prostate cancer (HCC) 12/19/2013   Gleason 4+3=7, volume 31.31 cc   Rhabdomyolysis 04/12/2021   Sciatica    Shortness of breath dyspnea    with exertion    Viral hepatitis C     Past Surgical History:  Procedure Laterality Date   BIOPSY  04/16/2018   Procedure: BIOPSY;  Surgeon: Kristie Lamprey, MD;  Location: [facility]COPY;  Service: Endoscopy;;   BIOPSY  05/23/2020   Procedure: BIOPSY;  Surgeon: Charlanne Groom, MD;  Location: [facility];  Service: Gastroenterology;;  EGD and COLON   BIOPSY  12/09/2020   Procedure: BIOPSY;  Surgeon: Eda Iha, MD;  Location: [facility] ENDOSCOPY;  Service:  Gastroenterology;;   biopsy on throat     hx of    CATARACT EXTRACTION Bilateral    Dr. Medford Ferrier   COLONOSCOPY N/A 05/23/2020   Procedure: COLONOSCOPY;  Surgeon: Charlanne Groom, MD;  Location: [facility];  Service: Gastroenterology;  Laterality: N/A;   ENTEROSCOPY N/A 10/07/2020   Procedure: ENTEROSCOPY;  Surgeon: Avram Lupita BRAVO, MD;  Location: [facility] ENDOSCOPY;  Service: Endoscopy;  Laterality: N/A;  ENTEROSCOPY N/A 10/03/2023   Procedure: ENTEROSCOPY;  Surgeon: San Sandor GAILS, DO;  Location: [facility]OSCOPY;  Service: Gastroenterology;  Laterality: N/A;   ENTEROSCOPY N/A 10/14/2023   Procedure: ENTEROSCOPY;  Surgeon: Leigh Elspeth SQUIBB, MD;  Location: [facility]COPY;  Service: Gastroenterology;  Laterality: N/A;   ESOPHAGOGASTRODUODENOSCOPY Left 04/16/2018   Procedure: ESOPHAGOGASTRODUODENOSCOPY (EGD);  Surgeon: Kristie Lamprey, MD;  Location: [facility];  Service: Endoscopy;  Laterality: Left;   ESOPHAGOGASTRODUODENOSCOPY (EGD) WITH PROPOFOL  N/A 05/23/2020   Procedure: ESOPHAGOGASTRODUODENOSCOPY (EGD) WITH PROPOFOL ;  Surgeon: Charlanne Groom, MD;  Location: [facility];  Service: Gastroenterology;  Laterality: N/A;   ESOPHAGOGASTRODUODENOSCOPY (EGD) WITH PROPOFOL  N/A 12/09/2020   Procedure: ESOPHAGOGASTRODUODENOSCOPY (EGD) WITH PROPOFOL ;  Surgeon: Eda Iha, MD;  Location: [facility] ENDOSCOPY;  Service: Gastroenterology;  Laterality: N/A;   EYE SURGERY     FOOT SURGERY     GIVENS CAPSULE STUDY N/A 11/25/2023   Procedure: IMAGING PROCEDURE, GI TRACT, INTRALUMINAL, VIA CAPSULE;  Surgeon: Albertus Gordy HERO, MD;  Location: [facility] ENDOSCOPY;  Service: Gastroenterology;  Laterality: N/A;   HOT HEMOSTASIS N/A 04/16/2018   Procedure: HOT HEMOSTASIS (ARGON PLASMA COAGULATION/BICAP);  Surgeon: Kristie Lamprey, MD;  Location: [facility];  Service: Endoscopy;  Laterality: N/A;   HOT HEMOSTASIS N/A 05/23/2020   Procedure: HOT HEMOSTASIS (ARGON PLASMA COAGULATION/BICAP);  Surgeon: Charlanne Groom, MD;  Location: [facility]OSCOPY;  Service: Gastroenterology;  Laterality: N/A;   HOT HEMOSTASIS N/A 12/09/2020   Procedure: HOT HEMOSTASIS (ARGON PLASMA COAGULATION/BICAP);  Surgeon: Eda Iha, MD;  Location: [facility] ENDOSCOPY;  Service: Gastroenterology;  Laterality: N/A;   HOT HEMOSTASIS N/A 10/03/2023   Procedure: EGD, WITH ARGON PLASMA COAGULATION;  Surgeon: San Sandor GAILS, DO;  Location: [facility] ENDOSCOPY;  Service: Gastroenterology;  Laterality: N/A;   HOT HEMOSTASIS N/A 10/14/2023   Procedure: EGD, WITH ARGON PLASMA COAGULATION;  Surgeon: Leigh Elspeth SQUIBB, MD;  Location: [facility] ENDOSCOPY;  Service: Gastroenterology;  Laterality: N/A;   LYMPHADENECTOMY Bilateral 02/27/2014   Procedure: BILATERAL LYMPHADENECTOMY;  Surgeon: Ricardo Likens, MD;  Location: [facility] ORS;  Service: Urology;  Laterality: Bilateral;   POLYPECTOMY  05/23/2020   Procedure: POLYPECTOMY;  Surgeon: Charlanne Groom, MD;  Location: [facility] ENDOSCOPY;  Service: Gastroenterology;;   PROSTATE BIOPSY  12/2013   Gleason 4+3=7, volume 31.31 cc   ROBOT ASSISTED LAPAROSCOPIC RADICAL PROSTATECTOMY N/A 02/27/2014   Procedure: ROBOTIC ASSISTED LAPAROSCOPIC RADICAL PROSTATECTOMY WITH INDOCYANINE GREEN DYE;  Surgeon: Ricardo Likens, MD;  Location: [facility] ORS;  Service: Urology;  Laterality: N/A;    Family History  Problem Relation Age of Onset   Heart disease Mother    Heart attack Father 11   Cancer Sister        breast   Colon cancer Neg Hx    Esophageal cancer Neg Hx    Rectal cancer Neg Hx    Stomach cancer Neg Hx     Prior to Admission medications   Medication Sig Start Date End Date Taking? Authorizing Provider  amLODipine  (NORVASC ) 10 MG tablet Take 1 tablet (10 mg total) by mouth daily. 06/20/23  Yes Jadine Toribio SQUIBB, MD  aspirin  EC 81 MG tablet Take 1 tablet (81 mg total) by mouth daily. Swallow whole. Patient taking differently: Take 81 mg by mouth daily at 4 PM. Swallow whole. 09/25/23  Yes Tawkaliyar, Roya, DO  atorvastatin  (LIPITOR ) 80 MG tablet Take 1  tablet (80 mg total) by mouth daily. Patient taking differently: Take 80 mg by mouth daily at 6 PM. 09/25/23  Yes Tawkaliyar, Roya, DO  clopidogrel  (PLAVIX ) 75 MG  tablet Take 75 mg by mouth daily. 12/18/23  Yes [provider]  Glecaprevir -Pibrentasvir  (MAVYRET ) 100-40 MG TABS Take 3 tablet by mouth once a day Patient taking differently: Take 3 tablets by mouth daily at 4 PM. Take three tablets by mouth daily at 1600.  Hold medication from 12/20/23-12/25/23. 12/03/23  Yes   hydrALAZINE  (APRESOLINE ) 25 MG tablet Take 1 tablet (25 mg total) by mouth every 8 (eight) hours. 10/14/22  Yes Lou Claretta HERO, MD  Multiple Vitamin (MULTIVITAMIN WITH MINERALS) TABS tablet Take 1 tablet by mouth every morning.   Yes [provider]  Nutritional Supplements (NUTRITIONAL DRINK) LIQD Take 90 mLs by mouth in the morning, at noon, and at bedtime. Med Plus 2.0   Yes [provider]  octreotide  (SANDOSTATIN  LAR) 20 MG injection Inject 20 mg into the muscle every 28 (twenty-eight) days. 10/17/23  Yes Elnora Ip, MD  pantoprazole  (PROTONIX ) 40 MG tablet Take 1 tablet (40 mg total) by mouth 2 (two) times daily. 10/17/23  Yes Elnora Ip, MD  sennosides-docusate sodium  (SENOKOT-S) 8.6-50 MG tablet Take 1 tablet by mouth daily.   Yes [provider]  thiamine  (VITAMIN B-1) 100 MG tablet Take 1 tablet (100 mg total) by mouth daily. Patient taking differently: Take 100 mg by mouth every morning. 10/17/23  Yes Gomez-Caraballo, Maria, MD  Vitamin D, Ergocalciferol, (DRISDOL) 1.25 MG (50000 UNIT) CAPS capsule Take 50,000 Units by mouth once a week. 12/18/23  Yes [provider]  COMBIVENT RESPIMAT 20-100 MCG/ACT AERS respimat Inhale 1 puff into the lungs every 6 (six) hours as needed for shortness of breath. 06/12/18 08/19/19  [provider]  mometasone -formoterol  (DULERA) 100-5 MCG/ACT AERO Inhale 2 puffs into the lungs daily. Patient not taking: Reported on  08/19/2019 04/17/18 08/19/19  Rosario Eland I, MD  sucralfate  (CARAFATE ) 1 g tablet Take 1 tablet (1 g total) by mouth 4 (four) times daily. Patient not taking: Reported on 08/19/2019 04/17/18 08/19/19  Rosario Eland I, MD  tiotropium (SPIRIVA  HANDIHALER) 18 MCG inhalation capsule Place 1 capsule (18 mcg total) into inhaler and inhale daily. Patient not taking: Reported on 08/19/2019 04/17/18 08/19/19  Rosario Eland I, MD  traZODone  (DESYREL ) 50 MG tablet Take 50 mg by mouth at bedtime as needed for sleep. Patient not taking: Reported on 10/06/2020  10/08/20  [provider]    Current Facility-Administered Medications  Medication Dose Route Frequency Provider Last Rate Last Admin   acetaminophen  (TYLENOL ) tablet 650 mg  650 mg Oral Q6H PRN Zheng, Michael, DO       Or   acetaminophen  (TYLENOL ) suppository 650 mg  650 mg Rectal Q6H PRN Zheng, Michael, DO       amLODipine  (NORVASC ) tablet 10 mg  10 mg Oral Daily Zheng, Michael, DO   10 mg at 01/05/24 1118   atorvastatin  (LIPITOR ) tablet 80 mg  80 mg Oral QPM Zheng, Michael, DO       Glecaprevir -Pibrentasvir  100-40 MG TABS 3 tablet  3 tablet Oral Daily Zheng, Michael, DO       hydrALAZINE  (APRESOLINE ) tablet 25 mg  25 mg Oral Q8H Zheng, Michael, DO   25 mg at 01/05/24 9381   insulin  aspart (novoLOG ) injection 0-6 Units  0-6 Units Subcutaneous TID WC Zheng, Michael, DO   3 Units at 01/05/24 1329   melatonin tablet 3 mg  3 mg Oral QHS PRN Zheng, Michael, DO       pantoprazole  (PROTONIX ) EC tablet 40 mg  40 mg Oral BID Zheng,  Ozell, DO   40 mg at 01/05/24 1118   senna-docusate (Senokot-S) tablet 1 tablet  1 tablet Oral QHS PRN Zheng, Michael, DO       thiamine  (VITAMIN B1) tablet 100 mg  100 mg Oral Daily Zheng, Michael, DO   100 mg at 01/05/24 1118    Allergies as of 01/04/2024 - Review Complete 01/04/2024  Allergen Reaction Noted   Penicillins Other (See Comments) 05/16/2011    Social History   Socioeconomic History   Marital  status: Married    Spouse name: Not on file   Number of children: Not on file   Years of education: Not on file   Highest education level: Not on file  Occupational History   Not on file  Tobacco Use   Smoking status: Former    Current packs/day: 0.50    Types: Cigarettes   Smokeless tobacco: Never   Tobacco comments:    1 ppd Wants Patches .  Stopped 1.5 weeks ago   Vaping Use   Vaping status: Never Used  Substance and Sexual Activity   Alcohol use: Not Currently    Alcohol/week: 1.0 standard drink of alcohol    Types: 1 Cans of beer per week   Drug use: Not Currently    Types: Marijuana, Cocaine, Heroin    Comment: past hx approx 30 years ago    Sexual activity: Not on file  Other Topics Concern   Not on file  Social History Narrative   Not on file   Social Drivers of Health   Financial Resource Strain: Low Risk  (09/21/2023)   Overall Financial Resource Strain (CARDIA)    Difficulty of Paying Living Expenses: Not hard at all  Food Insecurity: Patient Unable To Answer (01/05/2024)   Hunger Vital Sign    Worried About Running Out of Food in the Last Year: Patient unable to answer    Ran Out of Food in the Last Year: Patient unable to answer  Transportation Needs: No Transportation Needs (01/05/2024)   PRAPARE - Administrator, Civil Service (Medical): No    Lack of Transportation (Non-Medical): No  Physical Activity: Insufficiently Active (09/21/2023)   Exercise Vital Sign    Days of Exercise per Week: 2 days    Minutes of Exercise per Session: 30 min  Stress: No Stress Concern Present (09/21/2023)   [employer] of Occupational Health - Occupational Stress Questionnaire    Feeling of Stress : Not at all  Social Connections: Patient Unable To Answer (01/05/2024)   Social Connection and Isolation Panel    Frequency of Communication with Friends and Family: Patient unable to answer    Frequency of Social Gatherings with Friends and Family: Patient unable to  answer    Attends Religious Services: Patient unable to answer    Active Member of Clubs or Organizations: Patient unable to answer    Attends Banker Meetings: Patient unable to answer    Marital Status: Patient unable to answer  Intimate Partner Violence: Not At Risk (01/05/2024)   Humiliation, Afraid, Rape, and Kick questionnaire    Fear of Current or Ex-Partner: No    Emotionally Abused: No    Physically Abused: No    Sexually Abused: No     Code Status   Code Status: Full Code  Review of Systems: Not obtained - not reliable. Patient with dementia and dysarthria  Physical Exam: Vital signs in last 24 hours: Temp:  [97.7 F (36.5 C)-98.9 F (37.2  C)] 98.9 F (37.2 C) (09/18 1224) Pulse Rate:  [79-90] 81 (09/18 1224) Resp:  [11-18] 16 (09/18 0800) BP: (155-185)/(78-95) 155/78 (09/18 1224) SpO2:  [100 %] 100 % (09/18 1224) Weight:  [66 kg] 66 kg (09/17 2136)    General:  Pleasant male in NAD Psych:  Cooperative. Normal mood and affect Eyes: Pupils equal Ears:  Normal auditory acuity Nose: No deformity, discharge or lesions Neck:  Supple, no masses felt Lungs:  Clear to auscultation.  Heart:  Regular rate, regular rhythm.  Abdomen:  Soft, nondistended, nontender, active bowel sounds, no masses felt Rectal :  Deferred Msk: Symmetrical without gross deformities.  Neurologic:  Alert, oriented, grossly normal neurologically Extremities : No edema Skin:  Intact without significant lesions.    Intake/Output from previous day: No intake/output data recorded. Intake/Output this shift:  No intake/output data recorded.   Vina Dasen, NP-C   01/05/2024, 1:30 PM  ------------------------------------------------------------  I have taken a history, reviewed the chart and examined the patient. I performed a substantive portion of this encounter, including complete performance of at least one of the key components, in conjunction with the APP. I agree with  the APP's note, impression and recommendations  78 year old male with vascular dementia and advanced kidney disease, with numerous hospital admissions for acute on chronic anemia secondary to GI bleeding from small bowel angioectasias.  Most recent enteroscopy in June with multiple jejunal AVMs ablated with APC.  Subsequent capsule endoscopy ordered, but unable to complete due to patient noncompliance with equipment.  KUB had been ordered to assess for clearance of capsule, but has not been completed. He was admitted from his care facility with a hemoglobin of 5, which improved to 8 after 2 units PRBC.  Patient is pleasantly demented, so unclear if he was having overt bleeding.  Almost certain that patient has bled from intestinal AVMs.  Although repeat enteroscopy or capsule endoscopy could be considered, this may be best managed conservatively with blood products as needed.  Recommend supportive care and observation for now.  If patient demonstrates any evidence of ongoing GI bleeding, we can repeat push enteroscopy. If patient's hemoglobin remained stable and stools not concerning for active bleeding, would avoid another sedated procedure in this patient. Continue octreotide . Okay for regular diet.  Keni Wafer E. Stacia, MD [facility]astroenter[facility]

## 2024-01-05 NOTE — Progress Notes (Signed)
 HD#0 SUBJECTIVE:  Patient Summary: Gregory Nunez. is a 78 y.o. person living with a history of chronic duodenal AVM, anemia of chronic disease, chronic thrombocytopenia vascular dementia, CKD 5, history of ischemic CVA with residual dysarthria, HTN, chronic HCV who presented with hemoglobin 5.8 and admitted for acute on chronic blood loss anemia   Overnight Events: None  Interim History: Saw pt at bedside this AM. Gregory Nunez he was feeling good. Mentioned the room was cold. Denied any acute complaints.   OBJECTIVE:  Vital Signs: Vitals:   01/05/24 0600 01/05/24 0800 01/05/24 1118 01/05/24 1224  BP: (!) 182/85 (!) 175/92 (!) 185/81 (!) 155/78  Pulse: 79 88  81  Resp: 11 16    Temp:    98.9 F (37.2 C)  TempSrc:      SpO2: 100% 100%  100%  Weight:      Height:       Supplemental O2: Room Air SpO2: 100 %  Filed Weights   01/04/24 2136  Weight: 66 kg    No intake or output data in the 24 hours ending 01/05/24 1410 Net IO Since Admission: No IO data has been entered for this period [01/05/24 1410]  Physical Exam: Physical Exam HENT:     Head: Normocephalic.  Cardiovascular:     Rate and Rhythm: Normal rate and regular rhythm.  Pulmonary:     Effort: Pulmonary effort is normal.     Breath sounds: Normal breath sounds.  Neurological:     Comments: Dysarthria Present      Patient Lines/Drains/Airways Status     Active Line/Drains/Airways     Name Placement date Placement time Site Days   Peripheral IV 01/05/24 18 G Anterior;Left;Upper Arm 01/05/24  0529  Arm  less than 1            Pertinent labs and imaging:      Latest Ref Rng & Units 01/05/2024    8:14 AM 01/04/2024   11:50 PM 01/04/2024   10:11 PM  CBC  WBC 4.0 - 10.5 K/uL   6.3   Hemoglobin 13.0 - 17.0 g/dL 8.5  5.4  5.8   Hematocrit 39.0 - 52.0 % 24.8  16.0  18.5   Platelets 150 - 400 K/uL   140        Latest Ref Rng & Units 01/05/2024    6:17 AM 01/04/2024   11:50 PM 01/04/2024   10:11 PM   CMP  Glucose 70 - 99 mg/dL 805   598   BUN 8 - 23 mg/dL 67   78   Creatinine 9.38 - 1.24 mg/dL 6.22   5.63   Sodium 864 - 145 mmol/L 140  142  139   Potassium 3.5 - 5.1 mmol/L 4.1  4.6  4.3   Chloride 98 - 111 mmol/L 105   110   CO2 22 - 32 mmol/L 19   18   Calcium  8.9 - 10.3 mg/dL 8.5   8.3   Total Protein 6.5 - 8.1 g/dL   7.2   Total Bilirubin 0.0 - 1.2 mg/dL   0.4   Alkaline Phos 38 - 126 U/L   56   AST 15 - 41 U/L   18   ALT 0 - 44 U/L   14     No results found.  ASSESSMENT/PLAN:  Assessment: Principal Problem:   Acute on chronic blood loss anemia Active Problems:   History of CVA (cerebrovascular accident)   Hyperlipidemia associated with type  2 diabetes mellitus (HCC)   Iron  deficiency anemia   Anemia due to chronic kidney disease   Chronic active hepatitis C (HCC)   Cerebrovascular accident (CVA) (HCC)   Essential hypertension   AVM (arteriovenous malformation) of small bowel, acquired   Plan: Gregory Nunez. is a 78 y.o. person living with a history of chronic duodenal AVM, anemia of chronic disease, chronic thrombocytopenia vascular dementia, CKD 5, history of ischemic CVA with residual dysarthria, HTN, chronic HCV who presented with hemoglobin 5.8 and admitted for acute on chronic blood loss anemia on hospital day 0   Chronic Duodenal AVM Acute on Chronic Anemia d/t GI bleeding  Anemia of Chronic Disease Pt denied any acute complaints, said he was feeling good. His repeat hgb after transfusion had improved to 8.5. GI consulted, appreciate recommendations.   -Transfuse again if hemoglobin less than 7 - GI onboard, appreciate recommendations - stopped ASA - Continue oral Protonix  40 mg twice daily - AM CBC    History of Ischemic CVA with residual dysarthria  Vascular Dementia Ischemic stroke on 09/21/2023. Patient on aspirin  and atorvastatin . ASA was paused for his acute bleed.  - Stopped aspirin   - Continue atorvastatin  80 mg daily    Hypertension Current BP 155/78.  Home medications include amlodipine  10 mg and hydralazine  25 mg 3 times daily. - Amlodipine  and hydralazine  - Monitor blood pressure   T2DM (controlled) On SSI   Chronic Thrombocytopenia (stable) Chronic Iron  Deficiency Anemia Platelets 140 on admission.   -Continue to trend     CKD5 stable since last hospitalization.  - Continue to monitor BMP   Chronic HCV HCV antibody reactive 11/24/2023.  Currently taking MAVYRET  100 to 48 mg x 3 tablets daily -LFTs normalized from previous admission   Best Practice: Diet: heart VTE: SCDs in setting of acute bleed  Code: Full   Disposition planning: Prior to Admission Living Arrangement: [facility] Anticipated Discharge Location: [facility]   Dispo: Admit patient to Observation with expected length of stay less than 2 midnights. Signature:  Rebecka Edgardo [name]  2:10 PM, 01/05/2024  On Call pager [phone]

## 2024-01-05 NOTE — ED Notes (Signed)
Transport called for pt.

## 2024-01-05 NOTE — TOC Initial Note (Addendum)
 Transition of Care (TOC) - Initial/Assessment Note    Patient Details  Name: Gregory Nunez. MRN: [id number] Date of Birth: December 20, 1945  Transition of Care [facility]) CM/SW Contact:    Lauraine FORBES Saa, LCSWA Phone Number: 01/05/2024, 2:03 PM  Clinical Narrative:                  2:03 PM Per chart review, patient is from [facility]. SNF informed CSW that patient was STR at SNF (06/07-06/30; 08/04-09/17) and could return when medically ready whether SNF insurance authorization is approved or not. CSW consulted financial counseling regarding LTC Medicaid. CSW sent patient's FL2 to [facility]. Patient has a PCP and insurance. Patient has HH histroy with [facility] and [facility]. Patient has DME (RW, shower stool, shower chair, BSC) history with [employer] and [facility]. Patient's preferred pharmacy's are [facility], [facility] [place], and [facility] [address]. CSW will continue to follow and be available to assist.  Expected Discharge Plan: Skilled Nursing Facility Barriers to Discharge: Continued Medical Work up, English as a second language teacher   Patient Goals and CMS Choice            Expected Discharge Plan and Services In-house Referral: Clinical Social Work   Post Acute Care Choice: Skilled Nursing Facility Living arrangements for the past 2 months: Skilled Nursing Facility                                      Prior Living Arrangements/Services Living arrangements for the past 2 months: Skilled Nursing Facility Lives with:: Facility Resident Patient language and need for interpreter reviewed:: Yes        Need for Family Participation in Patient Care: Yes (Comment) Care giver support system in place?: Yes (comment)   Criminal Activity/Legal Involvement Pertinent to Current Situation/Hospitalization: No - Comment as needed  Activities of Daily Living   ADL Screening (condition at time of admission) Independently performs ADLs?:  No Does the patient have a NEW difficulty with bathing/dressing/toileting/self-feeding that is expected to last >3 days?: No Does the patient have a NEW difficulty with getting in/out of bed, walking, or climbing stairs that is expected to last >3 days?: No Does the patient have a NEW difficulty with communication that is expected to last >3 days?: No Is the patient deaf or have difficulty hearing?: No Does the patient have difficulty seeing, even when wearing glasses/contacts?: No Does the patient have difficulty concentrating, remembering, or making decisions?: Yes  Permission Sought/Granted Permission sought to share information with : Family Supports, Magazine features editor Permission granted to share information with : No (Contact information on chart)  Share Information with NAME: Puneet Masoner  Permission granted to share info w AGENCY: [facility]  Permission granted to share info w Relationship: Nephew  Permission granted to share info w Contact Information: [phone]  Emotional Assessment   Attitude/Demeanor/Rapport: Unable to Assess Affect (typically observed): Unable to Assess Orientation: : Oriented to Self Alcohol / Substance Use: Not Applicable Psych Involvement: No (comment)  Admission diagnosis:  Gastrointestinal hemorrhage, unspecified gastrointestinal hemorrhage type [K92.2] Anemia, unspecified type [D64.9] Acute on chronic blood loss anemia [D62] Patient Active Problem List   Diagnosis Date Noted   GI bleeding 11/23/2023   Blood loss anemia 10/18/2023   Acute on chronic anemia 10/15/2023   Protein-calorie malnutrition (HCC) 10/14/2023   Thiamine  deficiency 10/14/2023   Antiplatelet or antithrombotic  long-term use 10/14/2023   Upper GI bleeding 10/14/2023   Recent cerebrovascular accident (CVA) 10/14/2023   Gastritis and gastroduodenitis 10/03/2023   Duodenal erosion 10/03/2023   AVM (arteriovenous malformation) of small bowel,  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   GI bleed 10/01/2023   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   Cerebrovascular accident (CVA) (HCC) 09/22/2023   Encephalopathy 09/22/2023   Aphasia 09/22/2023   Neutropenia (HCC) 09/22/2023   Stage 4 chronic kidney disease (HCC) 09/22/2023   Essential hypertension 09/22/2023   Confusion with non-focal neuro exam 08/17/2023   Acute on chronic blood loss anemia 06/13/2023   Chronic active hepatitis C (HCC) 11/03/2022   History of epistaxis 12/04/2021   Debility 04/15/2021   History of alcohol abuse 04/13/2021   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/12/2021   Weakness of both legs    Adenomatous duodenal polyp    AVM (arteriovenous malformation) of duodenum, acquired with hemorrhage    Angiodysplasia of intestine    Iron  deficiency anemia 05/22/2020   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 (HCC)    History of CVA (cerebrovascular accident) 04/23/2020   Hyperlipidemia associated with type 2 diabetes mellitus (HCC) 04/23/2020   CKD (chronic kidney disease) stage 4, GFR 15-29 ml/min (HCC) 04/23/2020   Tobacco use 04/23/2020   Seizure disorder (HCC) 11/08/2018   Hypertension associated with diabetes (HCC) 11/08/2018   Type 2 diabetes with complication (HCC) 11/08/2018   Prostate cancer (HCC) 02/27/2014   PCP:  Charmayne Holmes, DO Pharmacy:   [name] [place], [place] - [address] [place] [address] Phone: [phone] Fax: [phone]  [facility] [place] [facility] [place] [place] - [address] [place] [address] [place] [address] Phone: [phone] Fax: [phone]  [facility] [address] [place] [address] Phone: [phone] Fax: [phone]     Social Drivers of Health (SDOH) Social History: SDOH Screenings   Food Insecurity: Patient Unable To Answer (01/05/2024)  Housing: Low Risk  (01/05/2024)  Transportation Needs: No  Transportation Needs (01/05/2024)  Utilities: Not At Risk (01/05/2024)  Alcohol Screen: Low Risk  (09/21/2023)  Depression (PHQ2-9): Low Risk  (11/03/2022)  Financial Resource Strain: Low Risk  (09/21/2023)  Physical Activity: Insufficiently Active (09/21/2023)  Social Connections: Patient Unable To Answer (01/05/2024)  Stress: No Stress Concern Present (09/21/2023)  Tobacco Use: Medium Risk (01/04/2024)  Health Literacy: Adequate Health Literacy (11/03/2022)   SDOH Interventions:     Readmission Risk Interventions    10/17/2023    4:48 PM 06/20/2023    2:55 PM 06/15/2023    3:00 PM  Readmission Risk Prevention Plan  Post Dischage Appt   Complete  Medication Screening   Complete  Transportation Screening Complete Complete Complete  PCP or Specialist Appt within 5-7 Days  Complete   Home Care Screening  Complete   Medication Review (RN CM)  Complete

## 2024-01-05 NOTE — Evaluation (Addendum)
 Occupational Therapy Evaluation Patient Details Name: Gregory Nunez. MRN: [id number] DOB: [birth year] Today's Date: 01/05/2024   History of Present Illness   Gregory Aloia. is a 78 y.o. male who presented from [facility] with hgb of 5.8. PMHx: multiple recent admissions for GI bleeds, chronic duodenal AVM, anemia of chronic disease, chronic thrombocytopenia vascular dementia, CKD 5, history of ischemic CVA with residual dysarthria, HTN, chronic HCV.     Clinical Impressions Gregory Nunez was evaluated s/p the above admission list. He resides at [facility] and has assist for ADLs and mobility? at baseline. Upon evaluation the pt was limited by weakness, baseline cognitive impairment, unsteady gait and poor tolerance for activity. Overall he needed min A to complete bed mobility and to stand, he was initially able to walk with min A but progressed to needing max A +2. Due to the deficits listed below the pt also needs up to max A for LB ADLs and min A for UB ADLs. Pt will benefit from continued acute OT services and skilled inpatient follow up therapy, <3 hours/day.      If plan is discharge home, recommend the following:   A lot of help with bathing/dressing/bathroom;A lot of help with walking and/or transfers;Assistance with cooking/housework;Assist for transportation;Direct supervision/assist for medications management;Direct supervision/assist for financial management;Help with stairs or ramp for entrance;Supervision due to cognitive status     Functional Status Assessment   Patient has had a recent decline in their functional status and demonstrates the ability to make significant improvements in function in a reasonable and predictable amount of time.     Equipment Recommendations   None recommended by OT      Precautions/Restrictions   Precautions Precautions: Fall Recall of Precautions/Restrictions: Intact Restrictions Weight Bearing Restrictions Per  Provider Order: No     Mobility Bed Mobility Overal bed mobility: Needs Assistance Bed Mobility: Sit to Supine, Supine to Sit     Supine to sit: Mod assist Sit to supine: Min assist        Transfers Overall transfer level: Needs assistance Equipment used: Rolling walker (2 wheels) Transfers: Sit to/from Stand Sit to Stand: Min assist           General transfer comment: with fatigue pt progressed to needing max A +2 for ambulation with RW      Balance Overall balance assessment: Needs assistance Sitting-balance support: Feet supported Sitting balance-Leahy Scale: Fair     Standing balance support: Bilateral upper extremity supported, During functional activity Standing balance-Leahy Scale: Poor                             ADL either performed or assessed with clinical judgement   ADL Overall ADL's : Needs assistance/impaired Eating/Feeding: Set up;Sitting   Grooming: Set up;Sitting   Upper Body Bathing: Minimal assistance;Sitting   Lower Body Bathing: Moderate assistance;Sit to/from stand   Upper Body Dressing : Minimal assistance;Sitting   Lower Body Dressing: Moderate assistance;Sit to/from stand   Toilet Transfer: Minimal assistance;+2 for physical assistance;+2 for safety/equipment;Stand-pivot   Toileting- Clothing Manipulation and Hygiene: Moderate assistance       Functional mobility during ADLs: Minimal assistance;Maximal assistance;+2 for physical assistance;+2 for safety/equipment General ADL Comments: intially pt only needed min A, he progressed to max A +2 with fatigue     Vision Baseline Vision/History: 0 No visual deficits Vision Assessment?: No apparent visual deficits     Perception Perception: Not tested  Praxis Praxis: Not tested       Pertinent Vitals/Pain Pain Assessment Pain Assessment: Faces Faces Pain Scale: Hurts a little bit Pain Location: generalized with movement Pain Descriptors / Indicators:  Grimacing, Discomfort Pain Intervention(s): Limited activity within patient's tolerance     Extremity/Trunk Assessment Upper Extremity Assessment Upper Extremity Assessment: Generalized weakness;Difficult to assess due to impaired cognition;LUE deficits/detail;RUE deficits/detail RUE Deficits / Details: using functionally but needed assist to grasp RW LUE Deficits / Details: using functionally   Lower Extremity Assessment Lower Extremity Assessment: Defer to PT evaluation   Cervical / Trunk Assessment Cervical / Trunk Assessment: Kyphotic   Communication Communication Communication: Impaired Factors Affecting Communication: Difficulty expressing self;Reduced clarity of speech   Cognition Arousal: Alert Behavior During Therapy: WFL for tasks assessed/performed Cognition: History of cognitive impairments             OT - Cognition Comments: followed all simple 1 step commands, pleasantly confused. oritented to himself, location and loosely to situation                 Following commands: Impaired Following commands impaired: Only follows one step commands consistently, Follows multi-step commands inconsistently     Cueing  General Comments   Cueing Techniques: Verbal cues  VSS on RA           Home Living Family/patient expects to be discharged to:: Skilled nursing facility                Prior Functioning/Environment Prior Level of Function : Patient poor historian/Family not available;History of Falls (last six months)             Mobility Comments: Pt reports using a SPC or RW, no WC use and states he walks ADLs Comments: reports mod I for most tasks, staff assists with bathing. unsure of accuracy    OT Problem List: Decreased strength;Decreased range of motion;Decreased activity tolerance;Decreased safety awareness;Decreased knowledge of use of DME or AE;Decreased knowledge of precautions;Decreased cognition   OT Treatment/Interventions:  Self-care/ADL training;Therapeutic exercise;DME and/or AE instruction;Patient/family education;Balance training      OT Goals(Current goals can be found in the care plan section)   Acute Rehab OT Goals Patient Stated Goal: to get better OT Goal Formulation: With patient Time For Goal Achievement: 01/19/24 Potential to Achieve Goals: Good ADL Goals Pt Will Perform Grooming: with modified independence Pt Will Perform Upper Body Dressing: with modified independence Pt Will Perform Lower Body Dressing: with min assist;sit to/from stand Pt Will Transfer to Toilet: with contact guard assist;ambulating   OT Frequency:  Min 1X/week       AM-PAC OT 6 Clicks Daily Activity     Outcome Measure Help from another person eating meals?: A Little Help from another person taking care of personal grooming?: A Lot Help from another person toileting, which includes using toliet, bedpan, or urinal?: A Lot Help from another person bathing (including washing, rinsing, drying)?: A Lot Help from another person to put on and taking off regular upper body clothing?: A Little Help from another person to put on and taking off regular lower body clothing?: A Lot 6 Click Score: 14   End of Session Equipment Utilized During Treatment: Rolling walker (2 wheels);Gait belt Nurse Communication: Mobility status  Activity Tolerance: Patient tolerated treatment well Patient left: in bed;with call bell/phone within reach;with bed alarm set  OT Visit Diagnosis: Unsteadiness on feet (R26.81);Other abnormalities of gait and mobility (R26.89);Muscle weakness (generalized) (M62.81)  Time: 9061-9044 OT Time Calculation (min): 17 min Charges:  OT General Charges $OT Visit: 1 Visit OT Evaluation $OT Eval Moderate Complexity: 1 Mod  Gregory Nunez, OTR/L Acute Rehabilitation Services Office [phone] Secure Chat Communication Preferred   Gregory Nunez 01/05/2024, 12:19 PM

## 2024-01-06 DIAGNOSIS — F0150 Vascular dementia without behavioral disturbance: Secondary | ICD-10-CM | POA: Diagnosis not present

## 2024-01-06 DIAGNOSIS — N185 Chronic kidney disease, stage 5: Secondary | ICD-10-CM

## 2024-01-06 DIAGNOSIS — K5520 Angiodysplasia of colon without hemorrhage: Secondary | ICD-10-CM | POA: Diagnosis not present

## 2024-01-06 DIAGNOSIS — D62 Acute posthemorrhagic anemia: Secondary | ICD-10-CM | POA: Diagnosis not present

## 2024-01-06 DIAGNOSIS — Q2733 Arteriovenous malformation of digestive system vessel: Secondary | ICD-10-CM | POA: Diagnosis not present

## 2024-01-06 DIAGNOSIS — K922 Gastrointestinal hemorrhage, unspecified: Secondary | ICD-10-CM | POA: Diagnosis not present

## 2024-01-06 DIAGNOSIS — D509 Iron deficiency anemia, unspecified: Secondary | ICD-10-CM | POA: Diagnosis not present

## 2024-01-06 DIAGNOSIS — Z8673 Personal history of transient ischemic attack (TIA), and cerebral infarction without residual deficits: Secondary | ICD-10-CM | POA: Diagnosis not present

## 2024-01-06 LAB — BPAM RBC
Blood Product Expiration Date: 202510122359
Blood Product Expiration Date: 202510122359
ISSUE DATE / TIME: 202509180104
ISSUE DATE / TIME: 202509180337
Unit Type and Rh: 5100
Unit Type and Rh: 5100

## 2024-01-06 LAB — TYPE AND SCREEN
ABO/RH(D): O POS
Unit division: 0
Unit division: 0

## 2024-01-06 LAB — BASIC METABOLIC PANEL WITH GFR
Anion gap: 8 (ref 5–15)
BUN: 74 mg/dL — ABNORMAL HIGH (ref 8–23)
CO2: 19 mmol/L — ABNORMAL LOW (ref 22–32)
Calcium: 8.6 mg/dL — ABNORMAL LOW (ref 8.9–10.3)
Chloride: 109 mmol/L (ref 98–111)
Creatinine, Ser: 4.25 mg/dL — ABNORMAL HIGH (ref 0.61–1.24)
GFR, Estimated: 14 mL/min — ABNORMAL LOW (ref >60)
Glucose, Bld: 169 mg/dL — ABNORMAL HIGH (ref 70–99)
Potassium: 4.3 mmol/L (ref 3.5–5.1)
Sodium: 136 mmol/L (ref 135–145)

## 2024-01-06 LAB — CBC WITH DIFFERENTIAL/PLATELET
Abs Immature Granulocytes: 0.04 K/uL (ref 0.00–0.07)
Basophils Absolute: 0 K/uL (ref 0.0–0.1)
Basophils Relative: 0 %
Eosinophils Absolute: 0.2 K/uL (ref 0.0–0.5)
Eosinophils Relative: 3 %
HCT: 27.2 % — ABNORMAL LOW (ref 39.0–52.0)
Hemoglobin: 9.3 g/dL — ABNORMAL LOW (ref 13.0–17.0)
Immature Granulocytes: 1 %
Lymphocytes Relative: 13 %
Lymphs Abs: 0.9 K/uL (ref 0.7–4.0)
MCH: 30.5 pg (ref 26.0–34.0)
MCHC: 34.2 g/dL (ref 30.0–36.0)
MCV: 89.2 fL (ref 80.0–100.0)
Monocytes Absolute: 0.4 K/uL (ref 0.1–1.0)
Monocytes Relative: 6 %
Neutro Abs: 5.1 K/uL (ref 1.7–7.7)
Neutrophils Relative %: 77 %
Platelets: 150 K/uL (ref 150–400)
RBC: 3.05 MIL/uL — ABNORMAL LOW (ref 4.22–5.81)
RDW: 14 % (ref 11.5–15.5)
WBC: 6.6 K/uL (ref 4.0–10.5)
nRBC: 0 % (ref 0.0–0.2)

## 2024-01-06 LAB — GLUCOSE, CAPILLARY
Glucose-Capillary: 163 mg/dL — ABNORMAL HIGH (ref 70–99)
Glucose-Capillary: 115 mg/dL — ABNORMAL HIGH (ref 70–99)
Glucose-Capillary: 187 mg/dL — ABNORMAL HIGH (ref 70–99)
Glucose-Capillary: 176 mg/dL — ABNORMAL HIGH (ref 70–99)

## 2024-01-06 MED ORDER — TRAZODONE HCL 50 MG PO TABS
50.0000 mg | ORAL_TABLET | Freq: Once | ORAL | Status: AC
  Administered 2024-01-06: 50 mg via ORAL
  Filled 2024-01-06: qty 1

## 2024-01-06 MED ORDER — IRON SUCROSE 500 MG IVPB - SIMPLE MED
500.0000 mg | Freq: Once | INTRAVENOUS | Status: DC
  Filled 2024-01-06: qty 275

## 2024-01-06 MED ORDER — INSULIN ASPART 100 UNIT/ML IJ SOLN: 0.0000 [IU] | Freq: Every day | INTRAMUSCULAR | Status: DC

## 2024-01-06 MED ORDER — SODIUM CHLORIDE 0.9 % IV SOLN
500.0000 mg | Freq: Once | INTRAVENOUS | Status: AC
  Administered 2024-01-06: 500 mg via INTRAVENOUS
  Filled 2024-01-06: qty 25

## 2024-01-06 MED ORDER — INSULIN ASPART 100 UNIT/ML IJ SOLN
0.0000 [IU] | Freq: Three times a day (TID) | INTRAMUSCULAR | Status: DC
  Administered 2024-01-06 (×2): 3 [IU] via SUBCUTANEOUS

## 2024-01-06 MED ORDER — MAVYRET 100-40 MG PO TABS: 3.0000 | ORAL_TABLET | Freq: Every day | ORAL | Status: DC

## 2024-01-06 MED ORDER — SENNOSIDES-DOCUSATE SODIUM 8.6-50 MG PO TABS
2.0000 | ORAL_TABLET | Freq: Every day | ORAL | Status: DC
  Administered 2024-01-06: 2 via ORAL
  Filled 2024-01-06: qty 2

## 2024-01-06 MED ORDER — ACETAMINOPHEN 325 MG PO TABS: 650.0000 mg | ORAL_TABLET | Freq: Four times a day (QID) | ORAL | 0 refills | Status: AC | PRN

## 2024-01-06 MED ORDER — MELATONIN 3 MG PO TABS: 3.0000 mg | ORAL_TABLET | Freq: Every evening | ORAL | Status: DC | PRN

## 2024-01-06 MED ORDER — QUETIAPINE FUMARATE 25 MG PO TABS: 25.0000 mg | ORAL_TABLET | Freq: Once | ORAL | Status: DC

## 2024-01-06 NOTE — Plan of Care (Signed)

## 2024-01-06 NOTE — Plan of Care (Signed)
 Pt leaving via ambulance to snf

## 2024-01-06 NOTE — Discharge Summary (Signed)
 Name: Gregory Nunez Gregory Nunez. MRN: [id number] DOB: [birth year] 78 y.o. PCP: Charmayne Holmes, DO  Date of Admission: 01/04/2024  9:31 PM Date of Discharge: 01/06/2024 Attending Physician: Dr. MICAEL Riis Winfrey  Discharge Diagnosis: 1. Principal Problem:   Acute on chronic blood loss anemia Active Problems:   History of CVA (cerebrovascular accident)   Hyperlipidemia associated with type 2 diabetes mellitus (HCC)   Iron  deficiency anemia   Anemia due to chronic kidney disease   Chronic active hepatitis C (HCC)   Cerebrovascular accident (CVA) (HCC)   Essential hypertension   AVM (arteriovenous malformation) of small bowel, acquired   Anemia   Discharge Medications: Allergies as of 01/06/2024       Reactions   Penicillins Anaphylaxis, Other (See Comments)   Pt does not remember reaction but states he woke up in the hospital after taking        Medication List     STOP taking these medications    clopidogrel  75 MG tablet Commonly known as: PLAVIX        TAKE these medications    acetaminophen  325 MG tablet Commonly known as: TYLENOL  Take 2 tablets (650 mg total) by mouth every 6 (six) hours as needed for mild pain (pain score 1-3) or fever (or Fever >/= 101).   amLODipine  10 MG tablet Commonly known as: NORVASC  Take 1 tablet (10 mg total) by mouth daily.   aspirin  EC 81 MG tablet Take 1 tablet (81 mg total) by mouth daily. Swallow whole. What changed: when to take this   atorvastatin  80 MG tablet Commonly known as: LIPITOR  Take 1 tablet (80 mg total) by mouth daily. What changed: when to take this   hydrALAZINE  25 MG tablet Commonly known as: APRESOLINE  Take 1 tablet (25 mg total) by mouth every 8 (eight) hours.   Mavyret  100-40 MG Tabs Generic drug: Glecaprevir -Pibrentasvir  Take 3 tablet by mouth once a day What changed:  how much to take how to take this when to take this additional instructions   Mavyret  100-40 MG Tabs Generic drug:  Glecaprevir -Pibrentasvir  Take 3 tablets by mouth daily. What changed: Another medication with the same name was changed. Make sure you understand how and when to take each.   melatonin 3 MG Tabs tablet Take 1 tablet (3 mg total) by mouth at bedtime as needed.   multivitamin with minerals Tabs tablet Take 1 tablet by mouth every morning.   Nutritional Drink Liqd Take 90 mLs by mouth in the morning, at noon, and at bedtime. Med Plus 2.0   pantoprazole  40 MG tablet Commonly known as: PROTONIX  Take 1 tablet (40 mg total) by mouth 2 (two) times daily.   SandoSTATIN  LAR [place] 20 MG injection Generic drug: octreotide  Inject 20 mg into the muscle every 28 (twenty-eight) days.   sennosides-docusate sodium  8.6-50 MG tablet Commonly known as: SENOKOT-S Take 1 tablet by mouth daily.   thiamine  100 MG tablet Commonly known as: Vitamin B-1 Take 1 tablet (100 mg total) by mouth daily. What changed: when to take this   Vitamin D (Ergocalciferol) 1.25 MG (50000 UNIT) Caps capsule Commonly known as: DRISDOL Take 50,000 Units by mouth once a week.        Disposition and follow-up:   [name] was discharged from [facility] in Good condition.  At the hospital follow up visit please address:  1.  If pt is fatigued and if Hgb levels are low  2.  Labs / imaging needed  at time of follow-up: CBC, Fe   3.  Pending labs/ test needing follow-up: none  Follow-up Appointments: PCP   Hospital Course by problem list: Gregory Nunez. is a 78 y.o. person living with a history of chronic duodenal AVM, anemia of chronic disease, chronic thrombocytopenia vascular dementia, CKD 5, history of ischemic CVA with residual dysarthria, HTN, chronic HCV who presented with hemoglobin 5.8 and admitted for acute on chronic blood loss anemia on hospital day    Chronic Duodenal AVM Acute on Chronic Anemia d/t GI bleeding  Anemia of Chronic Disease Presented with  hemoglobin 5.8 and asymptomatic denying dizziness, lightheadedness, weakness.  Denied blood in stool or melena. On admission, pt said he felt well overall and was just there because his blood numbers were down. Pt was FOBT+. Previous push enteroscopy on 10/14/2023 showed multiple AVMs in duodenum that were treated with argon plasma coagulation.  GI attempted PillCam during last hospitalization 11/28/2023 but that was unsuccessful.  Patient was lost to follow-up outpatient. Pt was Hemodynamically stable on admission with BP 174/91, regular rate, 2+ radial pulse. His last dose of octreotide  was on 12/27/2023. Pt received 2 units of blood. His Asprin was stopped due concerns for acute bleed. Continued oral Protonix  40 mg twice daily. Pt's hgb continued to improve and was stable around 9.3 during discharge. GI was consulted and recommended managing pt conservatively with blood and observing for any bleeds. GI recommended that a procedure was not necessary at this time if Hgb was stable with no signs of ongoing bleeding.His iron  levels were low so we gave him IV iron . Saw pt on discharge day, he said that he felt fine. His speech was hard to understand due to dysarthria. Pt was stable to be discharged to SNF. He needs OP monitoring on hgb levels and signs of bleeding.   History of Ischemic CVA with residual dysarthria  Vascular Dementia Ischemic stroke documented 09/21/2023 with infarcts in left thalamus and parietal white matter.  On admission, patient oriented to self and place but not year or month.  Patient believes that he lives at home across the street from the hospital and is checked in on by his aunt 3 times a month.  Dysarthria present on physical exam on admission and during discharge. Pt can continue aspirin  and atorvastatin  upon discharge. Pt's home medications included Plavix  for CVA, however, he was not taking it, per charting. Pt informed to not take plavix .    Hypertension Patient was hypertensive with  BP of 174/91 on admission and had a BP of 170/90 on discharge. Home medications included amlodipine  10 mg and hydralazine  25 mg 3 times daily that were continued.   T2DM (controlled) Glucose 400 on admit.  Last documented A1c on 09/22/2023 was 5.1. Pt was placed on SSI. His CBG was 115 during discharge.    Chronic Thrombocytopenia (stable) Chronic Iron  Deficiency Anemia Platelets 140 on admission.  Last admission platelets increased to 157 before discharge.  Likely from chronic kidney disease or iron  deficiency anemia. Current levels normalized to 150.    CKD5 Stable since last hospitalization   Chronic HCV HCV antibody reactive 11/24/2023.  Currently on  MAVYRET  100 to 48 mg x 3 tablets daily. LFTs normalized from previous admission        Subjective Saw pt at bedside this AM. [name] he felt fine. Speech hard to understand due to dysarthria.   Discharge Exam:   BP (!) 170/90 (BP Location: Right Arm)   Pulse 97  Temp 98.2 F (36.8 C) (Oral)   Resp 20   Ht 6' (1.829 m)   Wt 66 kg   SpO2 100%   BMI 19.73 kg/m  Discharge exam:  HENT:     Head: Normocephalic.  Pulmonary:     Effort: Pulmonary effort is normal.  Neurological:     Comments: Dysarthria noted   Pertinent Labs, Studies, and Procedures:     Latest Ref Rng & Units 01/06/2024    9:05 AM 01/05/2024    8:14 AM 01/04/2024   11:50 PM  CBC  WBC 4.0 - 10.5 K/uL 6.6     Hemoglobin 13.0 - 17.0 g/dL 9.3  8.5  5.4   Hematocrit 39.0 - 52.0 % 27.2  24.8  16.0   Platelets 150 - 400 K/uL 150          Latest Ref Rng & Units 01/06/2024    9:05 AM 01/05/2024    6:17 AM 01/04/2024   11:50 PM  CMP  Glucose 70 - 99 mg/dL 830  805    BUN 8 - 23 mg/dL 74  67    Creatinine 9.38 - 1.24 mg/dL 5.74  6.22    Sodium 864 - 145 mmol/L 136  140  142   Potassium 3.5 - 5.1 mmol/L 4.3  4.1  4.6   Chloride 98 - 111 mmol/L 109  105    CO2 22 - 32 mmol/L 19  19    Calcium  8.9 - 10.3 mg/dL 8.6  8.5      No results found.   Discharge  Instructions: Discharge Instructions     Call MD for:  extreme fatigue   Complete by: As directed    Call MD for:  persistant dizziness or light-headedness   Complete by: As directed    Diet - low sodium heart healthy   Complete by: As directed    Increase activity slowly   Complete by: As directed        Signed: Edgardo Pontiff, DO 01/06/2024, 2:05 PM

## 2024-01-06 NOTE — Plan of Care (Signed)
 Patient unable to comprehend teaching and is questionable if he is able to remember any instructions. Has difficulty following 1 step commands

## 2024-01-06 NOTE — Progress Notes (Signed)
 Patient refused to let the lab draw labs that are ordered for this am. Told the lab tech goodbye.  She will attempt later this am to obtain labs that are ordered.

## 2024-01-06 NOTE — Care Management Obs Status (Addendum)
 MEDICARE OBSERVATION STATUS NOTIFICATION   Patient Details  Name: Gregory Nunez. MRN: [id number] Date of Birth: 05/26/1945   Medicare Observation Status Notification Given:  Yes  Verbally reviewed observation notice with [name] telephonically at [phone].  [name] ask me to mail the copy to him.   [name] 01/06/2024, 10:45 AM

## 2024-01-06 NOTE — Progress Notes (Signed)
 Pharmacist rounding with the [facility]MT[facility], asked to provide consult for IV Iron  replacement. Assessed total body iron  deficit for iron  replacement therapy based upon the following considerations:  Weight 145 pounds; Targeted hemoglobin of 12 g/dL; Actual hemoglobin of 9.3 g/dL; Iron  Stores (use 500 mg for adults).   Lynwood Lites, PharmD,CPP Clinical Pharmacist Practitioner

## 2024-01-06 NOTE — Progress Notes (Addendum)
 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  He is treated with Sandostatin  . Admitted with another acute drop in hemoglobin  / FOBT+  ( ? on Plavix ) .  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  No plans for repeat endoscopic evaluation this admission.   --If patient rebleeds will consider push enteroscopy or reattempt at capsule endoscopy  (patient removed equipment/leads during the study during last admission ).  --Continue octreotide    Subjective   No physical complaints    Objective   GI Studies:  None this admission  Recent Labs    01/04/24 2211 01/04/24 2350 01/05/24 0814 01/06/24 0905  WBC 6.3  --   --  6.6  HGB 5.8* 5.4* 8.5* 9.3*  HCT 18.5* 16.0* 24.8* 27.2*  MCV 98.4  --   --  89.2  PLT 140*  --   --  150   Recent Labs    01/04/24 2211  FOLATE 7.7  VITAMINB12 814  FERRITIN 26  TIBC 281  IRONPCTSAT 11*   Recent Labs    01/04/24 2211 01/04/24 2350 01/05/24 0617 01/06/24 0905  NA 139 142 140 136  K 4.3 4.6 4.1 4.3  CL 110  --  105 109  CO2 18*  --  19* 19*   GLUCOSE 401*  --  194* 169*  BUN 78*  --  67* 74*  CREATININE 4.36*  --  3.77* 4.25*  CALCIUM  8.3*  --  8.5* 8.6*   Recent Labs    01/04/24 2211  PROT 7.2  ALBUMIN 2.5*  AST 18  ALT 14  ALKPHOS 56  BILITOT 0.4      Imaging:  VAS US  CAROTID Carotid Arterial Duplex Study  Patient Name:  Gregory Nunez.  Date of Exam:   09/23/2023 Medical Rec #: [id number]                Accession #:    [id number] Date of Birth: 1946-01-12                Patient Gender: M Patient Age:   82 years Exam Location:  [facility] Procedure:      VAS US  CAROTID Referring Phys: PRAMOD SETHI  --------------------------------------------------------------------------------   Indications:       CVA. Risk Factors:      Hypertension, hyperlipidemia, Diabetes, current smoker, prior                    CVA. Other Factors:     CKD,. Comparison Study:  No previous exams  Performing Technologist: Jody Hill RVT, RDMS  Examination Guidelines: A complete evaluation includes B-mode imaging, spectral Doppler, color Doppler, and power Doppler as needed of all accessible portions of each vessel. Bilateral testing is considered an integral part of a complete examination. Limited examinations for reoccurring indications may be performed as noted.    Right Carotid Findings: +----------+--------+--------+--------+------------------+------------------+           PSV cm/sEDV cm/sStenosisPlaque DescriptionComments           +----------+--------+--------+--------+------------------+------------------+ CCA Prox  108     10                                                   +----------+--------+--------+--------+------------------+------------------+ CCA Distal78      11                                intimal thickening +----------+--------+--------+--------+------------------+------------------+ ICA Prox  42      11              focal and calcific                    +----------+--------+--------+--------+------------------+------------------+ ICA Distal37      10                                                   +----------+--------+--------+--------+------------------+------------------+ ECA       80      0                                                    +----------+--------+--------+--------+------------------+------------------+  +----------+--------+-------+----------------+-------------------+           PSV cm/sEDV cmsDescribe        Arm Pressure (mmHG) +----------+--------+-------+----------------+-------------------+ [id number]            Multiphasic, WNL                    +----------+--------+-------+----------------+-------------------+  +---------+--------+--+--------+-+---------+ VertebralPSV cm/s46EDV cm/s9Antegrade +---------+--------+--+--------+-+---------+     Left Carotid Findings: +----------+--------+--------+--------+---------------------+------------------+           PSV cm/sEDV cm/sStenosisPlaque Description   Comments           +----------+--------+--------+--------+---------------------+------------------+ CCA Prox  111     14                                                      +----------+--------+--------+--------+---------------------+------------------+ CCA Distal86      11                                   intimal thickening +----------+--------+--------+--------+---------------------+------------------+ ICA Prox  32      9               focal and  heterogenous                            +----------+--------+--------+--------+---------------------+------------------+ ICA Distal29      9                                                       +----------+--------+--------+--------+---------------------+------------------+ ECA       70      0                                                        +----------+--------+--------+--------+---------------------+------------------+  +----------+--------+--------+----------------+-------------------+           PSV cm/sEDV cm/sDescribe        Arm Pressure (mmHG) +----------+--------+--------+----------------+-------------------+ [id number]              Multiphasic, WNL                    +----------+--------+--------+----------------+-------------------+  +---------+--------+--+--------+-+---------+ VertebralPSV cm/s28EDV cm/s6Antegrade +---------+--------+--+--------+-+---------+        Summary: Right Carotid: The extracranial vessels were near-normal with only minimal wall                thickening or plaque.  Left Carotid: The extracranial vessels were near-normal with only minimal wall               thickening or plaque.  Vertebrals:  Bilateral vertebral arteries demonstrate antegrade flow. Subclavians: Normal flow hemodynamics were seen in bilateral subclavian              arteries.  *See table(s) above for measurements and observations.    Electronically signed by Eather Popp MD on 09/26/2023 at 8:00:57 AM.      Final       Scheduled inpatient medications:   amLODipine   10 mg Oral Daily   atorvastatin   80 mg Oral QPM   Glecaprevir -Pibrentasvir   3 tablet Oral Daily   hydrALAZINE   25 mg Oral Q8H   insulin  aspart  0-15 Units Subcutaneous TID WC   insulin  aspart  0-5 Units Subcutaneous QHS   pantoprazole   40 mg Oral BID   senna-docusate  2 tablet Oral QHS   thiamine   100 mg Oral Daily   Continuous inpatient infusions:  PRN inpatient medications: acetaminophen  **OR** acetaminophen , melatonin, senna-docusate  Vital signs in last 24 hours: Temp:  [98.2 F (36.8 C)-98.3 F (36.8 C)] 98.2 F (36.8 C) (09/19 1041) Pulse Rate:  [90-99] 97 (09/19 1600) Resp:  [18-20] 20 (09/19 1041) BP: (160-176)/(81-91) 160/86 (09/19 1600) SpO2:  [100 %] 100 % (09/19 1041) Last BM  Date : 01/06/24  Intake/Output Summary (Last 24 hours) at 01/06/2024 1725 Last data filed at 01/06/2024 1702 Gross per 24 hour  Intake 240 ml  Output 300 ml  Net -60 ml    Intake/Output from previous day: 09/18 0701 - 09/19 0700 In: 177 [P.O.:177] Out: -  Intake/Output this shift: Total I/O In: 240 [P.O.:240] Out: 300 [Urine:300]   Physical Exam:  General: Alert male in NAD Heart:  Regular rate and rhythm.  Pulmonary: Normal respiratory effort Abdomen: Soft, nondistended, nontender. Normal bowel sounds. Neurologic: Alert and oriented  Psych: Pleasant. Cooperative     LOS: 0 days   Vina Dasen ,NP 01/06/2024, 5:25 PM  ----------------------------------------------------------------  I have taken a history, reviewed the chart and examined the patient. I performed a substantive portion of this encounter, including complete performance of at least one of the key components, in conjunction with the APP. I agree with the APP's note, impression and recommendations  Hemoglobin stable, no evidence of active bleeding. Recommend conservative management of current bleeding AVMs for now.  Hold off on repeat endoscopic evaluation. Continue octreotide .  Likely okay for discharge from GI standpoint tomorrow. GI will sign off.  Please reconsult if patient shows evidence of rebleeding.  Amaris Garrette E. Stacia, MD [facility]

## 2024-01-06 NOTE — Progress Notes (Signed)
 Pt leaving via ambulance. Vitals stable. Paperwork given. Pt had a couple things in bag unknown, given to ambulance personnel

## 2024-01-06 NOTE — Progress Notes (Signed)
 Patient had wet the bed and nursing changed sheets and cleaned patient up. Patient became very agitated and unable to calm him down. Patient yelling out loud and trying to get out of bed. Asking nurse to hold me.  Attempted to redirect patient to lie back and relax but patient refusing to cooperate.  He continues yelling out loud at this time.  MD notified and awaiting for a response.

## 2024-01-06 NOTE — TOC Progression Note (Addendum)
 Transition of Care [facility]) - Progression Note    Patient Details  Name: Gregory Nunez. MRN: [id number] Date of Birth: 01-Jan-1946  [facility] CM/SW Contact  Lauraine FORBES Saa, LCSWA Phone Number: 01/06/2024, 10:33 AM  Clinical Narrative:     10:33 AM Per medical team, patient is medically ready to discharge to SNF today. [facility] SNF admissions confirmed patient could return without SNF insurance authorization; however, they could not admit patient today due to COVID outbreak admission protocols. CSW made medical team aware. CSW will continue to follow and be available to assist.  Expected Discharge Plan: Skilled Nursing Facility Barriers to Discharge: Other (must enter comment) (SNF pendibg bed availability)               Expected Discharge Plan and Services In-house Referral: Clinical Social Work   Post Acute Care Choice: Skilled Nursing Facility Living arrangements for the past 2 months: Skilled Nursing Facility                                       Social Drivers of Health (SDOH) Interventions SDOH Screenings   Food Insecurity: Patient Unable To Answer (01/05/2024)  Housing: Low Risk  (01/05/2024)  Transportation Needs: No Transportation Needs (01/05/2024)  Utilities: Not At Risk (01/05/2024)  Alcohol Screen: Low Risk  (09/21/2023)  Depression (PHQ2-9): Low Risk  (11/03/2022)  Financial Resource Strain: Low Risk  (09/21/2023)  Physical Activity: Insufficiently Active (09/21/2023)  Social Connections: Patient Unable To Answer (01/05/2024)  Stress: No Stress Concern Present (09/21/2023)  Tobacco Use: Medium Risk (01/04/2024)  Health Literacy: Adequate Health Literacy (11/03/2022)    Readmission Risk Interventions    10/17/2023    4:48 PM 06/20/2023    2:55 PM 06/15/2023    3:00 PM  Readmission Risk Prevention Plan  Post Dischage Appt   Complete  Medication Screening   Complete  Transportation Screening Complete Complete Complete  PCP  or Specialist Appt within 5-7 Days  Complete   Home Care Screening  Complete   Medication Review (RN CM)  Complete

## 2024-01-06 NOTE — TOC Transition Note (Addendum)
 Transition of Care [facility]) - Discharge Note   Patient Details  Name: Gregory Nunez. MRN: [id number] Date of Birth: 22-Feb-1946  Transition of Care [facility]) CM/SW Contact:  Lauraine FORBES Saa, LCSWA Phone Number: 01/06/2024, 2:28 PM   Clinical Narrative:     Patient will DC to: [facility] SNF Anticipated DC date: 01/06/2024 Family notified: Fintan Grater; [place]; [phone] Transport by: [facility]   Per MD patient ready for DC to [facility] SNF. Per SNF, patient has be available to discharge to today. RN to call report prior to discharge [phone]). RN, patient's family, and facility notified of DC (patient is not fully oriented). SNF informed patient's nephew of current COVID outbreak at [facility]. Discharge Summary and FL2 sent to facility. DC packet on chart. Ambulance transport requested for patient at 17:00 as requested by bedside RN due to IV RX.  CSW will sign off for now as social work intervention is no longer needed. Please consult us  again if new needs arise.    Final next level of care: Skilled Nursing Facility Barriers to Discharge: Barriers Resolved   Patient Goals and CMS Choice            Discharge Placement              Patient chooses bed at: [facility] Patient to be transferred to facility by: [facility] Name of family member notified: Kayla Weekes; [place]; [phone] Patient and family notified of of transfer: 01/06/24  Discharge Plan and Services Additional resources added to the After Visit Summary for   In-house Referral: Clinical Social Work   Post Acute Care Choice: Skilled Nursing Facility                               Social Drivers of Health (SDOH) Interventions SDOH Screenings   Food Insecurity: Patient Unable To Answer (01/05/2024)  Housing: Low Risk  (01/05/2024)  Transportation Needs: No Transportation Needs (01/05/2024)  Utilities: Not At Risk (01/05/2024)  Alcohol Screen: Low Risk  (09/21/2023)   Depression (PHQ2-9): Low Risk  (11/03/2022)  Financial Resource Strain: Low Risk  (09/21/2023)  Physical Activity: Insufficiently Active (09/21/2023)  Social Connections: Patient Unable To Answer (01/05/2024)  Stress: No Stress Concern Present (09/21/2023)  Tobacco Use: Medium Risk (01/04/2024)  Health Literacy: Adequate Health Literacy (11/03/2022)     Readmission Risk Interventions    10/17/2023    4:48 PM 06/20/2023    2:55 PM 06/15/2023    3:00 PM  Readmission Risk Prevention Plan  Post Dischage Appt   Complete  Medication Screening   Complete  Transportation Screening Complete Complete Complete  PCP or Specialist Appt within 5-7 Days  Complete   Home Care Screening  Complete   Medication Review (RN CM)  Complete

## 2024-01-06 NOTE — Progress Notes (Signed)
 HD#0 SUBJECTIVE:  Patient Summary: Gregory Nunez. is a 78 y.o. person person living with a history of chronic duodenal AVM, anemia of chronic disease, chronic thrombocytopenia vascular dementia, CKD 5, history of ischemic CVA with residual dysarthria, HTN, chronic HCV who presented with hemoglobin 5.8 and admitted for acute on chronic blood loss anemia   Overnight Events:  Denied getting AM labs. Had an episode of agitation and received trazodone .   Interim History:  Saw pt at bedside this AM. [name] he felt fine. Speech hard to understand due to dysarthria.   OBJECTIVE:  Vital Signs: Vitals:   01/05/24 2000 01/05/24 2156 01/06/24 0545 01/06/24 1041  BP: (!) 162/81 (!) 162/81 (!) 176/91 (!) 170/90  Pulse: 90  99 97  Resp: 18  18 20   Temp: 98.3 F (36.8 C)   98.2 F (36.8 C)  TempSrc: Oral   Oral  SpO2: 100%   100%  Weight:      Height:       Supplemental O2: Room Air SpO2: 100 %  Filed Weights   01/04/24 2136  Weight: 66 kg     Intake/Output Summary (Last 24 hours) at 01/06/2024 1114 Last data filed at 01/05/2024 1500 Gross per 24 hour  Intake 177 ml  Output --  Net 177 ml   Net IO Since Admission: 177 mL [01/06/24 1114]  Physical Exam: Physical Exam HENT:     Head: Normocephalic.  Pulmonary:     Effort: Pulmonary effort is normal.  Neurological:     Comments: Dysarthria noted     Patient Lines/Drains/Airways Status     Active Line/Drains/Airways     Name Placement date Placement time Site Days   Peripheral IV 01/05/24 18 G Anterior;Left;Upper Arm 01/05/24  0529  Arm  1            Pertinent labs and imaging:      Latest Ref Rng & Units 01/06/2024    9:05 AM 01/05/2024    8:14 AM 01/04/2024   11:50 PM  CBC  WBC 4.0 - 10.5 K/uL 6.6     Hemoglobin 13.0 - 17.0 g/dL 9.3  8.5  5.4   Hematocrit 39.0 - 52.0 % 27.2  24.8  16.0   Platelets 150 - 400 K/uL 150          Latest Ref Rng & Units 01/05/2024    6:17 AM 01/04/2024   11:50 PM 01/04/2024    10:11 PM  CMP  Glucose 70 - 99 mg/dL 805   598   BUN 8 - 23 mg/dL 67   78   Creatinine 9.38 - 1.24 mg/dL 6.22   5.63   Sodium 864 - 145 mmol/L 140  142  139   Potassium 3.5 - 5.1 mmol/L 4.1  4.6  4.3   Chloride 98 - 111 mmol/L 105   110   CO2 22 - 32 mmol/L 19   18   Calcium  8.9 - 10.3 mg/dL 8.5   8.3   Total Protein 6.5 - 8.1 g/dL   7.2   Total Bilirubin 0.0 - 1.2 mg/dL   0.4   Alkaline Phos 38 - 126 U/L   56   AST 15 - 41 U/L   18   ALT 0 - 44 U/L   14     No results found.  ASSESSMENT/PLAN:  Assessment: Principal Problem:   Acute on chronic blood loss anemia Active Problems:   History of CVA (cerebrovascular accident)   Hyperlipidemia associated  with type 2 diabetes mellitus (HCC)   Iron  deficiency anemia   Anemia due to chronic kidney disease   Chronic active hepatitis C (HCC)   Cerebrovascular accident (CVA) (HCC)   Essential hypertension   AVM (arteriovenous malformation) of small bowel, acquired   Anemia   Plan: Gregory Nunez. is a 78 y.o. person living with a history of chronic duodenal AVM, anemia of chronic disease, chronic thrombocytopenia vascular dementia, CKD 5, history of ischemic CVA with residual dysarthria, HTN, chronic HCV who presented with hemoglobin 5.8 and admitted for acute on chronic blood loss anemia on hospital day 0   Chronic Duodenal AVM Acute on Chronic Anemia d/t GI bleeding  Anemia of Chronic Disease Pt denied any acute complaints, said he was feeling good. Unable to repeat labs in AM as he denied. Was able to get labs later and Hgb improved to 9.3. GI recommended managing him conservatively with blood and observing for any bleeds. Will hold on any procedure now if Hgb stable, no signs of ongoing bleeding. Pt can be discharged to SNF with current improved hgb levels, however d/c delayed due to no bed availability. Low iron  levels with ferritin levels on the lower end of normal. Will replete levels with IV iron .   - Manage  conservatively - ordered IV iron  - Transfuse again if hemoglobin less than 7 - d/c to SNF upon bed availability  - GI onboard, appreciate recommendations - stopped ASA - Continue oral Protonix  40 mg twice daily - AM CBC     History of Ischemic CVA with residual dysarthria  Vascular Dementia - Stopped aspirin   - Continue atorvastatin  80 mg daily   Hypertension Current BP 170/90.  Home medications include amlodipine  10 mg and hydralazine  25 mg 3 times daily. - Amlodipine  and hydralazine  - Monitor blood pressure   T2DM (controlled) On SSI   Chronic Thrombocytopenia (stable) Chronic Iron  Deficiency Anemia Current levels normalized to 150. -Continue to trend     CKD5 stable since last hospitalization.  - Continue to monitor BMP   Chronic HCV HCV antibody reactive 11/24/2023.  Currently taking MAVYRET  100 to 48 mg x 3 tablets daily -LFTs normalized from previous admission  Best Practice: Diet: heart VTE: SCDs in setting of acute bleed  Code: Full   Disposition planning: Prior to Admission Living Arrangement: [facility] Anticipated Discharge Location: [facility]   Dispo: Admit patient to Observation with expected length of stay less than 2 midnights. Signature:    Signature:  Rebecka Edgardo [name]  11:14 AM, 01/06/2024  On Call pager [phone]

## 2024-01-06 NOTE — Discharge Instructions (Addendum)
 To Gregory Nunez [name]. or their caretakers,  You were recently admitted to [facility] for a GI bleed, that was likely in the setting of your known AV malformations throughout your GI tract.  Continue taking your home medications with the following changes:  Continue taking octreotide  weekly.  All other home medications Stop taking Plavix     You should seek further medical care if your hgb levels drop too low or if your symptoms worsen.  Please follow up with the following doctors/specialties: Primary Care Physician within 1 week of discharge   We recommend that you also see your primary care doctor in about a week to make sure that you continue to improve. We are so glad that you are feeling better.  Sincerely,  [facility]

## 2024-02-03 ENCOUNTER — Encounter (HOSPITAL_COMMUNITY): Payer: Self-pay

## 2024-02-20 ENCOUNTER — Encounter (HOSPITAL_COMMUNITY): Payer: Self-pay

## 2024-02-20 ENCOUNTER — Ambulatory Visit: Admitting: Diagnostic Neuroimaging

## 2024-02-20 ENCOUNTER — Encounter: Payer: Self-pay | Admitting: Diagnostic Neuroimaging

## 2024-02-20 VITALS — BP 139/73 | HR 90 | Ht 69.0 in

## 2024-02-20 DIAGNOSIS — I63312 Cerebral infarction due to thrombosis of left middle cerebral artery: Secondary | ICD-10-CM | POA: Diagnosis not present

## 2024-02-20 DIAGNOSIS — I63332 Cerebral infarction due to thrombosis of left posterior cerebral artery: Secondary | ICD-10-CM

## 2024-02-20 NOTE — Progress Notes (Signed)
 [facility]  PATIENT: Gregory Nunez. DOB: Apr 02, 1946  REFERRING CLINICIAN: Ascencion Camelia HERO, NP-C HISTORY FROM: patient and brother  REASON FOR VISIT: new consult    HISTORICAL  CHIEF COMPLAINT:  Chief Complaint  Patient presents with   Seizures    Rm 7 with brother Norleen  Pt is well, brother is unaware of any seizure concerns.     HISTORY OF PRESENT ILLNESS:   UPDATE (02/20/24, VRP): Since last visit, was in hospital for stroke in June 2025. Then 2 more admissions for GI bleeding and anemia. Referral notes request seizure follow up, but there is no mention of breakthrough seizures in notes or per family.  PRIOR HPI (10/07/20, VRP): 78 year old male here for evaluation of stroke and seizure.  July 2020 patient presented to hospital for witnessed seizure, possibly related to alcohol withdrawal.  January 2022 patient presented to hospital for right-sided weakness and shakiness.  Patient was diagnosed with stroke and completed work-up.  June 2022 patient presented to hospital for syncope and visual scintillations.  Abnormal EEG slowing was noted.  Patient was discharged on antiseizure medication.  Since that time patient is doing well.  No further seizures or syncope.  No more strokelike symptoms.  He stayed away from alcohol.   REVIEW OF SYSTEMS: Full 14 system review of systems performed and negative with exception of: as per HPI.   ALLERGIES: Allergies  Allergen Reactions   Penicillins Anaphylaxis and Other (See Comments)    Pt does not remember reaction but states he woke up in the hospital after taking      HOME MEDICATIONS: Outpatient Medications Prior to Visit  Medication Sig Dispense Refill   acetaminophen  (TYLENOL ) 325 MG tablet Take 2 tablets (650 mg total) by mouth every 6 (six) hours as needed for mild pain (pain score 1-3) or fever (or Fever >/= 101). 30 tablet 0   aspirin  EC 81 MG tablet Take 1 tablet (81 mg total) by mouth  daily. Swallow whole.     atorvastatin  (LIPITOR ) 80 MG tablet Take 1 tablet (80 mg total) by mouth daily.     Glecaprevir -Pibrentasvir  (MAVYRET ) 100-40 MG TABS Take 3 tablet by mouth once a day 171 tablet 0   Glecaprevir -Pibrentasvir  (MAVYRET ) 100-40 MG TABS Take 3 tablets by mouth daily.     hydrALAZINE  (APRESOLINE ) 25 MG tablet Take 1 tablet (25 mg total) by mouth every 8 (eight) hours. 270 tablet 3   Multiple Vitamin (MULTIVITAMIN WITH MINERALS) TABS tablet Take 1 tablet by mouth every morning.     Nutritional Supplements (NUTRITIONAL DRINK) LIQD Take 90 mLs by mouth in the morning, at noon, and at bedtime. Med Plus 2.0     octreotide  (SANDOSTATIN  LAR) 20 MG injection Inject 20 mg into the muscle every 28 (twenty-eight) days. 1 each 2   pantoprazole  (PROTONIX ) 40 MG tablet Take 1 tablet (40 mg total) by mouth 2 (two) times daily.     sennosides-docusate sodium  (SENOKOT-S) 8.6-50 MG tablet Take 1 tablet by mouth daily.     thiamine  (VITAMIN B-1) 100 MG tablet Take 1 tablet (100 mg total) by mouth daily.     Vitamin D, Ergocalciferol, (DRISDOL) 1.25 MG (50000 UNIT) CAPS capsule Take 50,000 Units by mouth once a week.     amLODipine  (NORVASC ) 10 MG tablet Take 1 tablet (10 mg total) by mouth daily.     melatonin 3 MG TABS tablet Take 1 tablet (3 mg total) by mouth at bedtime as needed.  No facility-administered medications prior to visit.    PAST MEDICAL HISTORY: Past Medical History:  Diagnosis Date   Acute metabolic encephalopathy 10/22/2020   Acute metabolic encephalopathy 10/22/2020   Alcohol use 04/23/2020   Alcohol withdrawal seizure with complication, with unspecified complication (HCC) 11/08/2018   Angiodysplasia of stomach and duodenum with bleeding    CAO (chronic airflow obstruction) (HCC)    Cataract    OD   Chronic kidney disease, stage 4 (severe) (HCC)    Cognitive communication deficit    CVA (cerebral vascular accident) (HCC)    Depression    Diabetes mellitus  without complication (HCC)    Diabetic retinopathy (HCC)    NPDR OU   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)    Epilepsy (HCC)    GERD (gastroesophageal reflux disease)    if drinks alcohol   GI bleed 10/05/2020   HAV (hallux abducto valgus) 01/17/2013   Patient is approximately 5-week status post bunion correction left foot   Hyperlipidemia    Hypertension    Hypertensive retinopathy    OU   Hypoglycemia 10/22/2020   Malignant neoplasm of prostate (HCC) 01/09/2014   Neuropathy    Pancreatitis    Pneumococcal vaccination administered at current visit 11/10/2020   Prostate cancer (HCC) 12/19/2013   Gleason 4+3=7, volume 31.31 cc   Rhabdomyolysis 04/12/2021   Sciatica    Shortness of breath dyspnea    with exertion    Viral hepatitis C     PAST SURGICAL HISTORY: Past Surgical History:  Procedure Laterality Date   BIOPSY  04/16/2018   Procedure: BIOPSY;  Surgeon: Kristie Lamprey, MD;  Location: [facility];  Service: Endoscopy;;   BIOPSY  05/23/2020   Procedure: BIOPSY;  Surgeon: Charlanne Groom, MD;  Location: [facility];  Service: Gastroenterology;;  EGD and COLON   BIOPSY  12/09/2020   Procedure: BIOPSY;  Surgeon: Eda Iha, MD;  Location: [facility] ENDOSCOPY;  Service: Gastroenterology;;   biopsy on throat     hx of    CATARACT EXTRACTION Bilateral    Dr. Medford Ferrier   COLONOSCOPY N/A 05/23/2020   Procedure: COLONOSCOPY;  Surgeon: Charlanne Groom, MD;  Location: [facility];  Service: Gastroenterology;  Laterality: N/A;   ENTEROSCOPY N/A 10/07/2020   Procedure: ENTEROSCOPY;  Surgeon: Avram Lupita BRAVO, MD;  Location: [facility] ENDOSCOPY;  Service: Endoscopy;  Laterality: N/A;   ENTEROSCOPY N/A 10/03/2023   Procedure: ENTEROSCOPY;  Surgeon: San Sandor GAILS, DO;  Location: [facility]COPY;  Service: Gastroenterology;  Laterality: N/A;   ENTEROSCOPY N/A 10/14/2023   Procedure: ENTEROSCOPY;  Surgeon: Leigh Elspeth SQUIBB, MD;  Location: [facility] ENDOSCOPY;   Service: Gastroenterology;  Laterality: N/A;   ESOPHAGOGASTRODUODENOSCOPY Left 04/16/2018   Procedure: ESOPHAGOGASTRODUODENOSCOPY (EGD);  Surgeon: Kristie Lamprey, MD;  Location: [facility];  Service: Endoscopy;  Laterality: Left;   ESOPHAGOGASTRODUODENOSCOPY (EGD) WITH PROPOFOL  N/A 05/23/2020   Procedure: ESOPHAGOGASTRODUODENOSCOPY (EGD) WITH PROPOFOL ;  Surgeon: Charlanne Groom, MD;  Location: [facility];  Service: Gastroenterology;  Laterality: N/A;   ESOPHAGOGASTRODUODENOSCOPY (EGD) WITH PROPOFOL  N/A 12/09/2020   Procedure: ESOPHAGOGASTRODUODENOSCOPY (EGD) WITH PROPOFOL ;  Surgeon: Eda Iha, MD;  Location: [facility] ENDOSCOPY;  Service: Gastroenterology;  Laterality: N/A;   EYE SURGERY     FOOT SURGERY     GIVENS CAPSULE STUDY N/A 11/25/2023   Procedure: IMAGING PROCEDURE, GI TRACT, INTRALUMINAL, VIA CAPSULE;  Surgeon: Albertus Gordy HERO, MD;  Location: [facility]OS[facility];  Service: Gastroenterology;  Laterality: N/A;   HOT HEMOSTASIS N/A 04/16/2018   Procedure: HOT HEMOSTASIS (  ARGON PLASMA COAGULATION/BICAP);  Surgeon: Kristie Lamprey, MD;  Location: [facility];  Service: Endoscopy;  Laterality: N/A;   HOT HEMOSTASIS N/A 05/23/2020   Procedure: HOT HEMOSTASIS (ARGON PLASMA COAGULATION/BICAP);  Surgeon: Charlanne Groom, MD;  Location: [facility];  Service: Gastroenterology;  Laterality: N/A;   HOT HEMOSTASIS N/A 12/09/2020   Procedure: HOT HEMOSTASIS (ARGON PLASMA COAGULATION/BICAP);  Surgeon: Eda Iha, MD;  Location: [facility] ENDOSCOPY;  Service: Gastroenterology;  Laterality: N/A;   HOT HEMOSTASIS N/A 10/03/2023   Procedure: EGD, WITH ARGON PLASMA COAGULATION;  Surgeon: San Sandor GAILS, DO;  Location: [facility]OS[facility];  Service: Gastroenterology;  Laterality: N/A;   HOT HEMOSTASIS N/A 10/14/2023   Procedure: EGD, WITH ARGON PLASMA COAGULATION;  Surgeon: Leigh Elspeth SQUIBB, MD;  Location: [facility] ENDOSCOPY;  Service: Gastroenterology;  Laterality: N/A;   LYMPHADENECTOMY Bilateral 02/27/2014   Procedure: BILATERAL  LYMPHADENECTOMY;  Surgeon: Ricardo Likens, MD;  Location: [facility];  Service: Urology;  Laterality: Bilateral;   POLYPECTOMY  05/23/2020   Procedure: POLYPECTOMY;  Surgeon: Charlanne Groom, MD;  Location: [facility];  Service: Gastroenterology;;   PROSTATE BIOPSY  12/2013   Gleason 4+3=7, volume 31.31 cc   ROBOT ASSISTED LAPAROSCOPIC RADICAL PROSTATECTOMY N/A 02/27/2014   Procedure: ROBOTIC ASSISTED LAPAROSCOPIC RADICAL PROSTATECTOMY WITH INDOCYANINE GREEN DYE;  Surgeon: Ricardo Likens, MD;  Location: [facility];  Service: Urology;  Laterality: N/A;    FAMILY HISTORY: Family History  Problem Relation Age of Onset   Heart disease Mother    Heart attack Father 50   Cancer Sister        breast   Colon cancer Neg Hx    Esophageal cancer Neg Hx    Rectal cancer Neg Hx    Stomach cancer Neg Hx     SOCIAL HISTORY: Social History   Socioeconomic History   Marital status: Married    Spouse name: Not on file   Number of children: Not on file   Years of education: Not on file   Highest education level: Not on file  Occupational History   Not on file  Tobacco Use   Smoking status: Former    Current packs/day: 0.50    Types: Cigarettes   Smokeless tobacco: Never   Tobacco comments:    1 ppd Wants Patches .  Stopped 1.5 weeks ago   Vaping Use   Vaping status: Never Used  Substance and Sexual Activity   Alcohol use: Not Currently    Alcohol/week: 1.0 standard drink of alcohol    Types: 1 Cans of beer per week   Drug use: Not Currently    Types: Marijuana, Cocaine, Heroin    Comment: past hx approx 30 years ago    Sexual activity: Not on file  Other Topics Concern   Not on file  Social History Narrative   Not on file   Social Drivers of Health   Financial Resource Strain: Low Risk  (09/21/2023)   Overall Financial Resource Strain (CARDIA)    Difficulty of Paying Living Expenses: Not hard at all  Food Insecurity: Patient Unable To Answer (01/05/2024)   Hunger Vital Sign    Worried  About Running Out of Food in the Last Year: Patient unable to answer    Ran Out of Food in the Last Year: Patient unable to answer  Transportation Needs: No Transportation Needs (01/05/2024)   PRAPARE - Administrator, Civil Service (Medical): No    Lack of Transportation (Non-Medical): No  Physical Activity: Insufficiently Active (09/21/2023)   Exercise  Vital Sign    Days of Exercise per Week: 2 days    Minutes of Exercise per Session: 30 min  Stress: No Stress Concern Present (09/21/2023)   [employer] of Occupational Health - Occupational Stress Questionnaire    Feeling of Stress : Not at all  Social Connections: Patient Unable To Answer (01/05/2024)   Social Connection and Isolation Panel    Frequency of Communication with Friends and Family: Patient unable to answer    Frequency of Social Gatherings with Friends and Family: Patient unable to answer    Attends Religious Services: Patient unable to answer    Active Member of Clubs or Organizations: Patient unable to answer    Attends Banker Meetings: Patient unable to answer    Marital Status: Patient unable to answer  Intimate Partner Violence: Not At Risk (01/05/2024)   Humiliation, Afraid, Rape, and Kick questionnaire    Fear of Current or Ex-Partner: No    Emotionally Abused: No    Physically Abused: No    Sexually Abused: No     PHYSICAL EXAM  GENERAL EXAM/CONSTITUTIONAL: Vitals:  Vitals:   02/20/24 1520  BP: 139/73  Pulse: 90  Height: 5' 9 (1.753 m)    Body mass index is 21.49 kg/m. Wt Readings from Last 3 Encounters:  01/04/24 145 lb 8.1 oz (66 kg)  11/23/23 145 lb 8.1 oz (66 kg)  10/14/23 146 lb 2.6 oz (66.3 kg)   Patient is in no distress; well developed, nourished and groomed; neck is supple  CARDIOVASCULAR: Examination of carotid arteries is normal; no carotid bruits Regular rate and rhythm, no murmurs Examination of peripheral vascular system by observation and palpation is  normal  EYES: Ophthalmoscopic exam of optic discs and posterior segments is normal; no papilledema or hemorrhages No results found.  MUSCULOSKELETAL: Gait, strength, tone, movements noted in Neurologic exam below  NEUROLOGIC: MENTAL STATUS:     09/21/2023   11:27 AM  MMSE - Mini Mental State Exam  Not completed: Unable to complete   awake, alert, oriented to person, place and time recent and remote memory intact normal attention and concentration language fluent, comprehension intact, naming intact fund of knowledge appropriate  CRANIAL NERVE:  2nd - no papilledema on fundoscopic exam 2nd, 3rd, 4th, 6th - pupils equal and reactive to light, visual fields full to confrontation, extraocular muscles intact, no nystagmus 5th - facial sensation symmetric 7th - facial strength symmetric 8th - hearing intact 9th - palate elevates symmetrically, uvula midline 11th - shoulder shrug symmetric 12th - tongue protrusion midline MILD DYSARTHRIA  MOTOR:  normal bulk and tone BUE 5 RLE 4 (DF 3); LLE 5  SENSORY:  normal and symmetric to light touch, temperature, vibration  COORDINATION:  finger-nose-finger, fine finger movements normal  REFLEXES:  deep tendon reflexes TRACE and symmetric  GAIT/STATION:  narrow based gait; CAUTIOUS     DIAGNOSTIC DATA (LABS, IMAGING, TESTING) - I reviewed patient records, labs, notes, testing and imaging myself where available.  Lab Results  Component Value Date   WBC 6.6 01/06/2024   HGB 9.3 (L) 01/06/2024   HCT 27.2 (L) 01/06/2024   MCV 89.2 01/06/2024   PLT 150 01/06/2024      Component Value Date/Time   NA 136 01/06/2024 0905   NA 140 10/14/2022 1016   K 4.3 01/06/2024 0905   CL 109 01/06/2024 0905   CO2 19 (L) 01/06/2024 0905   GLUCOSE 169 (H) 01/06/2024 0905   BUN 74 (H) 01/06/2024  0905   BUN 39 (H) 10/14/2022 1016   CREATININE 4.25 (H) 01/06/2024 0905   CREATININE 1.52 (H) 08/28/2020 1427   CALCIUM  8.6 (L) 01/06/2024  0905   PROT 7.2 01/04/2024 2211   PROT 7.4 10/14/2022 1016   ALBUMIN 2.5 (L) 01/04/2024 2211   ALBUMIN 4.0 10/14/2022 1016   AST 18 01/04/2024 2211   ALT 14 01/04/2024 2211   ALKPHOS 56 01/04/2024 2211   BILITOT 0.4 01/04/2024 2211   BILITOT <0.2 10/14/2022 1016   GFRNONAA 14 (L) 01/06/2024 0905   GFRNONAA 45 (L) 08/28/2020 1427   GFRAA 52 (L) 08/28/2020 1427   Lab Results  Component Value Date   CHOL 146 09/22/2023   HDL 63 09/22/2023   LDLCALC 73 09/22/2023   TRIG 51 09/22/2023   CHOLHDL 2.3 09/22/2023   Lab Results  Component Value Date   HGBA1C 5.1 09/22/2023   Lab Results  Component Value Date   VITAMINB12 814 01/04/2024   Lab Results  Component Value Date   TSH 1.040 09/22/2023    10/07/20 EEG - This study is suggestive of nonspecific cortical dysfunction in left temporal region.  No seizures or epileptiform discharges were seen throughout the recording.  04/23/20 MRI HEAD IMPRESSION: 1. 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 2. Additional 5 mm subacute ischemic nonhemorrhagic right periatrial white matter infarct. 3. 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   04/23/20 MRA HEAD IMPRESSION: 1. Motion degraded exam. 2. Negative intracranial MRA for large vessel occlusion. 3. 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 No other hemodynamically significant or correctable stenosis identified.   04/23/20 MRA NECK IMPRESSION: 1. Wide patency of both carotid artery systems within the neck. 2. Single short-segment moderate stenosis involving the pre foraminal left V1 segment. Vertebral arteries otherwise widely patent within the neck. Left vertebral artery dominant.   04/24/20 TTE -  Conclusion(s)/Recommendation(s): No intracardiac source of embolism  detected on this transthoracic study. A transesophageal echocardiogram is  recommended to exclude cardiac source of embolism if clinically indicated.   09/22/23 MRI brain  1. Small acute infarcts in the left thalamus and left periatrial white matter. 2. Advanced chronic microvascular ischemic disease.  09/22/23 EEG - This study is suggestive of mild diffuse encephalopathy. No seizures or epileptiform discharges were seen throughout the recording.   ASSESSMENT AND PLAN  78 y.o. year old male here with stroke and seizures, history of alcohol abuse.   Dx:  1. 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    EDUARD FABIENE HANLON, MD 02/20/2024, 3:46 PM Certified in Neurology, Neurophysiology and Neuroimaging  [facility] [address] [place], [place] [address] [phone]

## 2024-02-20 NOTE — Patient Instructions (Signed)
  STROKE PREVENTION - aspirin  81mg  daily, statin, BP control  - continue PT, OT, ST  MODERATE-SEVERE VASCULAR DEMENTIA - continue supportive care  SEIZURE PREVENTION (last events in May 2020 and June 2022) - possibly related to alcohol withdrawal; no anti-seizure meds at this time

## 2024-02-27 ENCOUNTER — Ambulatory Visit: Admitting: Gastroenterology

## 2024-02-27 ENCOUNTER — Other Ambulatory Visit

## 2024-02-27 ENCOUNTER — Encounter: Payer: Self-pay | Admitting: Gastroenterology

## 2024-02-27 VITALS — BP 100/54 | HR 97 | Ht 69.0 in | Wt 156.0 lb

## 2024-02-27 DIAGNOSIS — N184 Chronic kidney disease, stage 4 (severe): Secondary | ICD-10-CM

## 2024-02-27 DIAGNOSIS — D500 Iron deficiency anemia secondary to blood loss (chronic): Secondary | ICD-10-CM

## 2024-02-27 DIAGNOSIS — K922 Gastrointestinal hemorrhage, unspecified: Secondary | ICD-10-CM

## 2024-02-27 DIAGNOSIS — I639 Cerebral infarction, unspecified: Secondary | ICD-10-CM | POA: Diagnosis not present

## 2024-02-27 DIAGNOSIS — K5520 Angiodysplasia of colon without hemorrhage: Secondary | ICD-10-CM

## 2024-02-27 LAB — CBC WITH DIFFERENTIAL/PLATELET
Basophils Absolute: 0 K/uL (ref 0.0–0.1)
Basophils Relative: 0.5 % (ref 0.0–3.0)
Eosinophils Absolute: 0.2 K/uL (ref 0.0–0.7)
Eosinophils Relative: 3.9 % (ref 0.0–5.0)
HCT: 21.6 % — CL (ref 39.0–52.0)
Hemoglobin: 7.4 g/dL — CL (ref 13.0–17.0)
Lymphocytes Relative: 21.9 % (ref 12.0–46.0)
Lymphs Abs: 0.9 K/uL (ref 0.7–4.0)
MCHC: 34.1 g/dL (ref 30.0–36.0)
MCV: 91.6 fl (ref 78.0–100.0)
Monocytes Absolute: 0.4 K/uL (ref 0.1–1.0)
Monocytes Relative: 9.5 % (ref 3.0–12.0)
Neutro Abs: 2.7 K/uL (ref 1.4–7.7)
Neutrophils Relative %: 64.2 % (ref 43.0–77.0)
Platelets: 145 K/uL — ABNORMAL LOW (ref 150.0–400.0)
RBC: 2.36 Mil/uL — ABNORMAL LOW (ref 4.22–5.81)
RDW: 15.5 % (ref 11.5–15.5)
WBC: 4.2 K/uL (ref 4.0–10.5)

## 2024-02-27 LAB — COMPREHENSIVE METABOLIC PANEL WITH GFR
ALT: 14 U/L (ref 0–53)
AST: 19 U/L (ref 0–37)
Albumin: 3.7 g/dL (ref 3.5–5.2)
Alkaline Phosphatase: 76 U/L (ref 39–117)
BUN: 80 mg/dL — ABNORMAL HIGH (ref 6–23)
CO2: 21 meq/L (ref 19–32)
Calcium: 9.1 mg/dL (ref 8.4–10.5)
Chloride: 107 meq/L (ref 96–112)
Creatinine, Ser: 4.33 mg/dL — ABNORMAL HIGH (ref 0.40–1.50)
GFR: 12.61 mL/min — CL (ref >60.00)
Glucose, Bld: 261 mg/dL — ABNORMAL HIGH (ref 70–99)
Potassium: 4.3 meq/L (ref 3.5–5.1)
Sodium: 138 meq/L (ref 135–145)
Total Bilirubin: 0.3 mg/dL (ref 0.2–1.2)
Total Protein: 8 g/dL (ref 6.0–8.3)

## 2024-02-27 LAB — IBC + FERRITIN
Ferritin: 71.1 ng/mL (ref 22.0–322.0)
Iron: 78 ug/dL (ref 42–165)
Saturation Ratios: 29.6 % (ref 20.0–50.0)
TIBC: 263.2 ug/dL (ref 250.0–450.0)
Transferrin: 188 mg/dL — ABNORMAL LOW (ref 212.0–360.0)

## 2024-02-27 LAB — B12 AND FOLATE PANEL
Folate: 8 ng/mL (ref >5.9)
Vitamin B-12: 769 pg/mL (ref 211–911)

## 2024-02-27 NOTE — Progress Notes (Signed)
 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  He has had APC treatment on multiple occasions and is treated with Sandostatin  injections and multiple blood transfusions.  Most recent admission was 12/2022.  Nephew is his museum/gallery exhibitions officer and he resides at [facility] which nephew states has been helpful with his medication compliance and monitoring.  During last admission September 2025 patient had plans for small bowel video capsule study, however, he removed the camera leads and equipment about 1 hour into the study   Last CBC shows Hgb 9.3 (patient's baseline is 8-9) and this was 01/06/2024.  Patient's nephew states his caretakers deny any overt bleeding at this time.  Patient is pleasantly demented.  He is taking his Sandostatin  every 28 days    PREVIOUS GI WORKUP   April 2018 screening colonoscopy  -multiple polyps up to 6 mm. Path - TA and SSA   Dec 2019 EGD for IDA -Duodenal bulb AVM, non-bleeding. Ablated. Diffuse gastritis with erosions. Path - chronic gastritis and intestinal metaplasia c/w chronic atrophic gastritis   Feb 2022 EGD for heme + anemia -non-bleeding AVM in body of stomach, ablated. Five 2-4 mm AVMs in D2 (ablated), a larger AVM with oozing in D3 was ablated. Path - chronic gastritis, reactive changes. No H.pylori.    Feb 2022 colonoscopy with TI intubation to 2 cm for heme + anemia -Adequate bowel prep. Multiple polyps up to 6 mm. Small ascending colon lipoma. Polyp path - hyperplastic polyps, tubular adenomas  Small bowel enteroscopy 10/07/2020 for IDA - Normal esophagus.  - Normal stomach.  - Normal  examined duodenum.  - The examined portion of the jejunum was normal.  - No specimens collected. NO ADDITOONAL AVMS SEEN - HAD SEVERAL ABLATED 2/ 2022  EGD 12/09/2020 for IDA - Z- line regular, 40 cm from the incisors. -  History of gastric intestinal metaplsai. Biopsied.  - Two non- bleeding angioectasias in the duodenum. Treated with argon plasma coagulation ( APC) .  - A single duodenal polyp. Resected and retrieved.  - The examination was otherwise normal.  Small bowel enteroscopy 10/03/2023 for IDA - Normal esophagus.  - Moderate, patchy gastritis noted in the gastric fundus, gastric cardia, and proximal gastric body. This was biopsied.  - Minimal, nonulcer gastritis noted in the remainder of the stomach.  - Four non- bleeding angioectasias in the duodenum. Treated with argon plasma coagulation ( APC) .  - The examined portion of the jejunum was normal.  - Single small, shallow erosion in the duodenal bulb. There is no active bleeding or stigmata of recent bleeding.   Small bowel colonoscopy 10/14/2023 - Normal esophagus.  - Erythematous mucosa in the antrum.  - Normal stomach otherwise.  - A single non- bleeding angiodysplastic lesion in the duodenum. Treated with argon plasma coagulation ( APC) .  - Five non- bleeding angiodysplastic lesions in the proximal duodenum. Treated with argon plasma coagulation ( APC) .  - Distal most extent of proximal jejunum reached was tattooed.  - Normal remainder of examined small bowel.  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)    Epilepsy (HCC)    GERD (gastroesophageal reflux disease)    if drinks alcohol   GI bleed 10/05/2020   HAV (hallux abducto valgus) 01/17/2013   Patient is approximately 5-week status post bunion correction left foot   Hyperlipidemia    Hypertension    Hypertensive retinopathy    OU   Hypoglycemia 10/22/2020   Malignant neoplasm of prostate (HCC) 01/09/2014   Neuropathy    Pancreatitis    Pneumococcal vaccination administered at current visit 11/10/2020   Prostate cancer (HCC) 12/19/2013   Gleason 4+3=7, volume 31.31 cc   Rhabdomyolysis 04/12/2021   Sciatica    Shortness of breath dyspnea    with exertion    Viral hepatitis C     Past Surgical History:  Procedure Laterality Date   BIOPSY  04/16/2018   Procedure: BIOPSY;  Surgeon: Kristie Lamprey, MD;  Location: [facility];  Service: Endoscopy;;   BIOPSY  05/23/2020   Procedure: BIOPSY;  Surgeon: Charlanne Groom, MD;  Location: [facility];  Service: Gastroenterology;;  EGD and COLON   BIOPSY  12/09/2020   Procedure: BIOPSY;  Surgeon: Eda Iha, MD;  Location: [facility] ENDOSCOPY;  Service: Gastroenterology;;   biopsy on throat     hx of    CATARACT EXTRACTION Bilateral    Dr. Medford Ferrier   COLONOSCOPY N/A 05/23/2020   Procedure: COLONOSCOPY;  Surgeon: Charlanne Groom, MD;  Location: [facility];  Service: Gastroenterology;  Laterality: N/A;   ENTEROSCOPY N/A 10/07/2020   Procedure: ENTEROSCOPY;  Surgeon: Avram Lupita BRAVO, MD;  Location: [facility] ENDOSCOPY;  Service: Endoscopy;  Laterality: N/A;   ENTEROSCOPY N/A 10/03/2023   Procedure: ENTEROSCOPY;  Surgeon: San Sandor GAILS, DO;  Location: [facility] ENDOSCOPY;  Service: Gastroenterology;  Laterality: N/A;   ENTEROSCOPY N/A 10/14/2023   Procedure: ENTEROSCOPY;  Surgeon:  Leigh Elspeth SQUIBB, MD;  Location: [facility] ENDOSCOPY;  Service: Gastroenterology;  Laterality: N/A;   ESOPHAGOGASTRODUODENOSCOPY Left 04/16/2018   Procedure: ESOPHAGOGASTRODUODENOSCOPY (EGD);  Surgeon: Kristie Lamprey, MD;  Location: [facility];  Service: Endoscopy;  Laterality: Left;   ESOPHAGOGASTRODUODENOSCOPY (EGD) WITH PROPOFOL  N/A 05/23/2020   Procedure: ESOPHAGOGASTRODUODENOSCOPY (EGD) WITH PROPOFOL ;  Surgeon: Charlanne Groom, MD;  Location: [facility];  Service: Gastroenterology;  Laterality: N/A;   ESOPHAGOGASTRODUODENOSCOPY (EGD) WITH PROPOFOL  N/A 12/09/2020   Procedure: ESOPHAGOGASTRODUODENOSCOPY (EGD) WITH PROPOFOL ;  Surgeon: Eda Iha, MD;  Location: [facility] ENDOSCOPY;  Service: Gastroenterology;  Laterality: N/A;   EYE SURGERY     FOOT SURGERY     GIVENS CAPSULE STUDY N/A 11/25/2023   Procedure: IMAGING PROCEDURE, GI TRACT, INTRALUMINAL, VIA CAPSULE;  Surgeon: Albertus Gordy HERO, MD;  Location: [facility] ENDOSCOPY;  Service: Gastroenterology;  Laterality: N/A;   HOT HEMOSTASIS N/A 04/16/2018   Procedure: HOT HEMOSTASIS (ARGON PLASMA COAGULATION/BICAP);  Surgeon: Kristie Lamprey, MD;  Location: [facility];  Service: Endoscopy;  Laterality: N/A;   HOT HEMOSTASIS N/A 05/23/2020   Procedure: HOT HEMOSTASIS (ARGON PLASMA COAGULATION/BICAP);  Surgeon: Charlanne Groom, MD;  Location: [facility]OSCOPY;  Service: Gastroenterology;  Laterality: N/A;   HOT HEMOSTASIS N/A 12/09/2020   Procedure: HOT HEMOSTASIS (ARGON PLASMA COAGULATION/BICAP);  Surgeon: Eda Iha, MD;  Location: [facility] ENDOSCOPY;  Service: Gastroenterology;  Laterality: N/A;   HOT HEMOSTASIS N/A 10/03/2023   Procedure: EGD, WITH ARGON PLASMA COAGULATION;  Surgeon: San Sandor GAILS, DO;  Location: [facility] ENDOSCOPY;  Service: Gastroenterology;  Laterality: N/A;   HOT HEMOSTASIS N/A 10/14/2023   Procedure: EGD, WITH ARGON PLASMA COAGULATION;  Surgeon: Leigh Elspeth SQUIBB, MD;  Location: [facility] ENDOSCOPY;  Service: Gastroenterology;  Laterality: N/A;    LYMPHADENECTOMY Bilateral 02/27/2014   Procedure: BILATERAL LYMPHADENECTOMY;  Surgeon: Ricardo Likens, MD;  Location: [facility] ORS;  Service: Urology;  Laterality: Bilateral;   POLYPECTOMY  05/23/2020   Procedure: POLYPECTOMY;  Surgeon: Charlanne Groom, MD;  Location: [facility] ENDOSCOPY;  Service: Gastroenterology;;   PROSTATE BIOPSY  12/2013   Gleason 4+3=7, volume 31.31 cc   ROBOT ASSISTED LAPAROSCOPIC RADICAL PROSTATECTOMY N/A 02/27/2014   Procedure: ROBOTIC ASSISTED LAPAROSCOPIC RADICAL PROSTATECTOMY WITH INDOCYANINE GREEN DYE;  Surgeon: Ricardo Likens, MD;  Location: [facility];  Service: Urology;  Laterality: N/A;    Current Outpatient Medications  Medication Sig Dispense Refill   acetaminophen  (TYLENOL ) 325 MG tablet Take 2 tablets (650 mg total) by mouth every 6 (six) hours as needed for mild pain (pain score 1-3) or fever (or Fever >/= 101). 30 tablet 0   amLODipine  (NORVASC ) 10 MG tablet Take 10 mg by mouth daily.     aspirin  EC 81 MG tablet Take 1 tablet (81 mg total) by mouth daily. Swallow whole.     atorvastatin  (LIPITOR ) 80 MG tablet Take 1 tablet (80 mg total) by mouth daily.     Glecaprevir -Pibrentasvir  (MAVYRET ) 100-40 MG TABS Take 3 tablets by mouth daily.     hydrALAZINE  (APRESOLINE ) 25 MG tablet Take 1 tablet (25 mg total) by mouth every 8 (eight) hours. 270 tablet 3   Multiple Vitamin (MULTIVITAMIN WITH MINERALS) TABS tablet Take 1 tablet by mouth every morning.     octreotide  (SANDOSTATIN  LAR) 20 MG injection Inject 20 mg into the muscle every 28 (twenty-eight) days. 1 each 2   pantoprazole  (PROTONIX ) 40 MG tablet Take 1 tablet (40 mg total) by mouth 2 (two) times daily.     sennosides-docusate sodium  (SENOKOT-S) 8.6-50 MG tablet Take 1 tablet by mouth daily.     thiamine  (VITAMIN B-1) 100 MG tablet Take 1 tablet (100 mg total) by mouth daily.     Vitamin D, Ergocalciferol, (DRISDOL) 1.25 MG (50000 UNIT) CAPS capsule Take 50,000 Units by mouth once a week.     Nutritional Supplements  (NUTRITIONAL DRINK) LIQD Take 90 mLs by mouth in the morning, at noon, and at bedtime. Med Plus 2.0     No current facility-administered medications for this visit.    Allergies as of 02/27/2024 - Review Complete 02/27/2024  Allergen Reaction Noted   Penicillins Anaphylaxis and Other (See Comments) 05/16/2011    Family History  Problem Relation Age of Onset   Heart disease Mother    Heart attack Father 32   Cancer Sister        breast   Colon cancer Neg Hx    Esophageal cancer Neg Hx    Rectal cancer Neg Hx    Stomach cancer Neg Hx     Social History   Socioeconomic History   Marital status: Married    Spouse name: Not on file   Number of children: Not on file  Years of education: Not on file   Highest education level: Not on file  Occupational History   Not on file  Tobacco Use   Smoking status: Former    Current packs/day: 0.50    Types: Cigarettes   Smokeless tobacco: Never   Tobacco comments:    1 ppd Wants Patches .  Stopped 1.5 weeks ago   Vaping Use   Vaping status: Never Used  Substance and Sexual Activity   Alcohol use: Not Currently    Alcohol/week: 1.0 standard drink of alcohol    Types: 1 Cans of beer per week   Drug use: Not Currently    Types: Marijuana, Cocaine, Heroin    Comment: past hx approx 30 years ago    Sexual activity: Not on file  Other Topics Concern   Not on file  Social History Narrative   Not on file   Social Drivers of Health   Financial Resource Strain: Low Risk  (09/21/2023)   Overall Financial Resource Strain (CARDIA)    Difficulty of Paying Living Expenses: Not hard at all  Food Insecurity: Patient Unable To Answer (01/05/2024)   Hunger Vital Sign    Worried About Running Out of Food in the Last Year: Patient unable to answer    Ran Out of Food in the Last Year: Patient unable to answer  Transportation Needs: No Transportation Needs (01/05/2024)   PRAPARE - Administrator, Civil Service (Medical): No    Lack  of Transportation (Non-Medical): No  Physical Activity: Insufficiently Active (09/21/2023)   Exercise Vital Sign    Days of Exercise per Week: 2 days    Minutes of Exercise per Session: 30 min  Stress: No Stress Concern Present (09/21/2023)   [employer] of Occupational Health - Occupational Stress Questionnaire    Feeling of Stress : Not at all  Social Connections: Patient Unable To Answer (01/05/2024)   Social Connection and Isolation Panel    Frequency of Communication with Friends and Family: Patient unable to answer    Frequency of Social Gatherings with Friends and Family: Patient unable to answer    Attends Religious Services: Patient unable to answer    Active Member of Clubs or Organizations: Patient unable to answer    Attends Banker Meetings: Patient unable to answer    Marital Status: Patient unable to answer  Intimate Partner Violence: Not At Risk (01/05/2024)   Humiliation, Afraid, Rape, and Kick questionnaire    Fear of Current or Ex-Partner: No    Emotionally Abused: No    Physically Abused: No    Sexually Abused: No    Review of Systems:    Constitutional: No weight loss, fever, chills, weakness or fatigue HEENT: Eyes: No change in vision               Ears, Nose, Throat:  No change in hearing or congestion Skin: No rash or itching Cardiovascular: No chest pain, chest pressure or palpitations   Respiratory: No SOB or cough Gastrointestinal: See HPI and otherwise negative Genitourinary: No dysuria or change in urinary frequency Neurological: No headache, dizziness or syncope Musculoskeletal: No new muscle or joint pain Hematologic: No bleeding or bruising Psychiatric: No history of depression or anxiety    Physical Exam:  Vital signs: BP (!) 100/54   Pulse 97   Ht 5' 9 (1.753 m)   Wt 156 lb (70.8 kg)   SpO2 99%   BMI 23.04 kg/m   Constitutional: NAD, alert  and cooperative Head:  Normocephalic and atraumatic. Eyes:   PEERL, EOMI. No  icterus. Conjunctiva pink. Respiratory: Respirations even and unlabored. Lungs clear to auscultation bilaterally.   No wheezes, crackles, or rhonchi.  Cardiovascular:  Regular rate and rhythm. No peripheral edema, cyanosis or pallor.  Gastrointestinal:  Soft, nondistended, nontender. No rebound or guarding. Normal bowel sounds. No appreciable masses or hepatomegaly. Rectal:  Declines Msk:  Symmetrical without gross deformities. Without edema, no deformity or joint abnormality.  Neurologic: Pleasantly demented Skin:   Dry and intact without significant lesions or rashes. Psychiatric: Oriented to person, place and time. Demonstrates good judgement and reason without abnormal affect or behaviors.   RELEVANT LABS AND IMAGING: CBC    Component Value Date/Time   WBC 6.6 01/06/2024 0905   RBC 3.05 (L) 01/06/2024 0905   HGB 9.3 (L) 01/06/2024 0905   HGB 10.6 (L) 10/14/2022 1016   HCT 27.2 (L) 01/06/2024 0905   HCT 32.2 (L) 10/14/2022 1016   PLT 150 01/06/2024 0905   PLT 166 10/14/2022 1016   MCV 89.2 01/06/2024 0905   MCV 93 10/14/2022 1016   MCH 30.5 01/06/2024 0905   MCHC 34.2 01/06/2024 0905   RDW 14.0 01/06/2024 0905   RDW 11.9 10/14/2022 1016   LYMPHSABS 0.9 01/06/2024 0905   MONOABS 0.4 01/06/2024 0905   EOSABS 0.2 01/06/2024 0905   BASOSABS 0.0 01/06/2024 0905    CMP     Component Value Date/Time   NA 136 01/06/2024 0905   NA 140 10/14/2022 1016   K 4.3 01/06/2024 0905   CL 109 01/06/2024 0905   CO2 19 (L) 01/06/2024 0905   GLUCOSE 169 (H) 01/06/2024 0905   BUN 74 (H) 01/06/2024 0905   BUN 39 (H) 10/14/2022 1016   CREATININE 4.25 (H) 01/06/2024 0905   CREATININE 1.52 (H) 08/28/2020 1427   CALCIUM  8.6 (L) 01/06/2024 0905   PROT 7.2 01/04/2024 2211   PROT 7.4 10/14/2022 1016   ALBUMIN 2.5 (L) 01/04/2024 2211   ALBUMIN 4.0 10/14/2022 1016   AST 18 01/04/2024 2211   ALT 14 01/04/2024 2211   ALKPHOS 56 01/04/2024 2211   BILITOT 0.4 01/04/2024 2211   BILITOT <0.2  10/14/2022 1016   GFRNONAA 14 (L) 01/06/2024 0905   GFRNONAA 45 (L) 08/28/2020 1427   GFRAA 52 (L) 08/28/2020 1427     Assessment/Plan:   78 year old male history of recent CVA June 2025 (no longer on Plavix ), CKD, vascular dementia, multiple hospitalizations for 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  Numerous hospital admissions for acute on chronic anemia secondary to GI bleeding from small bowel angioectasias with most recent enteroscopy in June with multiple jejunal AVMs ablated with APC.  Capsule endoscopy ordered but patient was noncompliant with equipment.  Patient unable to consent for procedures secondary to vascular dementia and nephew would prefer supportive care and conservative management moving forward if possible.  Reportedly no overt bleeding and compliant with his octreotide  every 28 days. - Recheck CBC, CMP, iron  studies today - Continue octreotide  - Pending results of CBC may consider capsule endoscopy.  Patient may be best managed conservatively with blood products as needed and serial CBCs  CVA June 2025 No longer on Plavix  per outpatient med list  CKD  History of colon polyps Two subcentimeter tubular adenomas removed in February 2022  Vascular dementia  Tynika Luddy Mollie RIGGERS [facility] 02/27/2024, 3:15 PM  Cc: Charmayne Holmes, DO

## 2024-02-27 NOTE — Patient Instructions (Signed)
 Your provider has requested that you go to the basement level for lab work before leaving today. Press B on the elevator. The lab is located at the first door on the left as you exit the elevator.  _______________________________________________________  If your blood pressure at your visit was 140/90 or greater, please contact your primary care physician to follow up on this.  _______________________________________________________  If you are age 76 or older, your body mass index should be between 23-30. Your Body mass index is 23.04 kg/m. If this is out of the aforementioned range listed, please consider follow up with your Primary Care Provider.  If you are age 26 or younger, your body mass index should be between 19-25. Your Body mass index is 23.04 kg/m. If this is out of the aformentioned range listed, please consider follow up with your Primary Care Provider.   ________________________________________________________  The [facility] providers would like to encourage you to use MYCHART to communicate with providers for non-urgent requests or questions.  Due to long hold times on the telephone, sending your provider a message by [facility] may be a faster and more efficient way to get a response.  Please allow 48 business hours for a response.  Please remember that this is for non-urgent requests.  _______________________________________________________  [facility] is using a team-based approach to care.  Your team is made up of your doctor and two to three APPS. Our APPS (Nurse Practitioners and Physician Assistants) work with your physician to ensure care continuity for you. They are fully qualified to address your health concerns and develop a treatment plan. They communicate directly with your gastroenterologist to care for you. Seeing the Advanced Practice Practitioners on your physician's team can help you by facilitating care more promptly, often allowing for earlier  appointments, access to diagnostic testing, procedures, and other specialty referrals.

## 2024-02-27 NOTE — Progress Notes (Signed)
 With assessment and plan as outlined.  Would continue octreotide  and monitor hemoglobin.  If he cannot tolerate capsule exam or comply with that then if he had recurrent bleeding and worsening anemia would consider enteroscopy as needed.  Hopefully he continues to be stable with octreotide 

## 2024-02-28 ENCOUNTER — Other Ambulatory Visit: Payer: Self-pay

## 2024-02-28 ENCOUNTER — Ambulatory Visit: Payer: Self-pay | Admitting: *Deleted

## 2024-02-28 ENCOUNTER — Inpatient Hospital Stay (HOSPITAL_COMMUNITY)
Admission: EM | Admit: 2024-02-28 | Discharge: 2024-03-03 | DRG: 378 | Disposition: A | Discharge status: Skilled Nursing Facility | Source: Skilled Nursing Facility

## 2024-02-28 DIAGNOSIS — K5521 Angiodysplasia of colon with hemorrhage: Secondary | ICD-10-CM | POA: Diagnosis present

## 2024-02-28 DIAGNOSIS — G40909 Epilepsy, unspecified, not intractable, without status epilepticus: Secondary | ICD-10-CM | POA: Diagnosis present

## 2024-02-28 DIAGNOSIS — K31819 Angiodysplasia of stomach and duodenum without bleeding: Secondary | ICD-10-CM | POA: Diagnosis not present

## 2024-02-28 DIAGNOSIS — K449 Diaphragmatic hernia without obstruction or gangrene: Secondary | ICD-10-CM | POA: Diagnosis present

## 2024-02-28 DIAGNOSIS — D631 Anemia in chronic kidney disease: Secondary | ICD-10-CM | POA: Diagnosis not present

## 2024-02-28 DIAGNOSIS — K219 Gastro-esophageal reflux disease without esophagitis: Secondary | ICD-10-CM | POA: Diagnosis present

## 2024-02-28 DIAGNOSIS — E1122 Type 2 diabetes mellitus with diabetic chronic kidney disease: Secondary | ICD-10-CM | POA: Diagnosis present

## 2024-02-28 DIAGNOSIS — D62 Acute posthemorrhagic anemia: Secondary | ICD-10-CM | POA: Diagnosis present

## 2024-02-28 DIAGNOSIS — K8020 Calculus of gallbladder without cholecystitis without obstruction: Secondary | ICD-10-CM | POA: Diagnosis present

## 2024-02-28 DIAGNOSIS — E119 Type 2 diabetes mellitus without complications: Secondary | ICD-10-CM

## 2024-02-28 DIAGNOSIS — N184 Chronic kidney disease, stage 4 (severe): Secondary | ICD-10-CM | POA: Diagnosis not present

## 2024-02-28 DIAGNOSIS — Z7982 Long term (current) use of aspirin: Secondary | ICD-10-CM

## 2024-02-28 DIAGNOSIS — D649 Anemia, unspecified: Secondary | ICD-10-CM | POA: Diagnosis not present

## 2024-02-28 DIAGNOSIS — Z789 Other specified health status: Secondary | ICD-10-CM

## 2024-02-28 DIAGNOSIS — K5520 Angiodysplasia of colon without hemorrhage: Secondary | ICD-10-CM | POA: Diagnosis not present

## 2024-02-28 DIAGNOSIS — K2940 Chronic atrophic gastritis without bleeding: Secondary | ICD-10-CM | POA: Diagnosis present

## 2024-02-28 DIAGNOSIS — Z7951 Long term (current) use of inhaled steroids: Secondary | ICD-10-CM | POA: Diagnosis not present

## 2024-02-28 DIAGNOSIS — Z880 Allergy status to penicillin: Secondary | ICD-10-CM

## 2024-02-28 DIAGNOSIS — D500 Iron deficiency anemia secondary to blood loss (chronic): Principal | ICD-10-CM

## 2024-02-28 DIAGNOSIS — B182 Chronic viral hepatitis C: Secondary | ICD-10-CM | POA: Diagnosis present

## 2024-02-28 DIAGNOSIS — I69322 Dysarthria following cerebral infarction: Secondary | ICD-10-CM | POA: Diagnosis not present

## 2024-02-28 DIAGNOSIS — K317 Polyp of stomach and duodenum: Secondary | ICD-10-CM | POA: Diagnosis present

## 2024-02-28 DIAGNOSIS — F17210 Nicotine dependence, cigarettes, uncomplicated: Secondary | ICD-10-CM | POA: Diagnosis present

## 2024-02-28 DIAGNOSIS — D696 Thrombocytopenia, unspecified: Secondary | ICD-10-CM | POA: Diagnosis present

## 2024-02-28 DIAGNOSIS — E113293 Type 2 diabetes mellitus with mild nonproliferative diabetic retinopathy without macular edema, bilateral: Secondary | ICD-10-CM | POA: Diagnosis present

## 2024-02-28 DIAGNOSIS — N185 Chronic kidney disease, stage 5: Secondary | ICD-10-CM | POA: Diagnosis present

## 2024-02-28 DIAGNOSIS — K31811 Angiodysplasia of stomach and duodenum with bleeding: Secondary | ICD-10-CM | POA: Diagnosis not present

## 2024-02-28 DIAGNOSIS — Z8249 Family history of ischemic heart disease and other diseases of the circulatory system: Secondary | ICD-10-CM

## 2024-02-28 DIAGNOSIS — F0390 Unspecified dementia without behavioral disturbance: Secondary | ICD-10-CM | POA: Diagnosis present

## 2024-02-28 DIAGNOSIS — E519 Thiamine deficiency, unspecified: Secondary | ICD-10-CM | POA: Diagnosis present

## 2024-02-28 DIAGNOSIS — I120 Hypertensive chronic kidney disease with stage 5 chronic kidney disease or end stage renal disease: Secondary | ICD-10-CM | POA: Diagnosis present

## 2024-02-28 DIAGNOSIS — Z9842 Cataract extraction status, left eye: Secondary | ICD-10-CM

## 2024-02-28 DIAGNOSIS — K3189 Other diseases of stomach and duodenum: Secondary | ICD-10-CM | POA: Diagnosis present

## 2024-02-28 DIAGNOSIS — I129 Hypertensive chronic kidney disease with stage 1 through stage 4 chronic kidney disease, or unspecified chronic kidney disease: Secondary | ICD-10-CM | POA: Diagnosis not present

## 2024-02-28 DIAGNOSIS — E785 Hyperlipidemia, unspecified: Secondary | ICD-10-CM | POA: Diagnosis present

## 2024-02-28 DIAGNOSIS — B1920 Unspecified viral hepatitis C without hepatic coma: Secondary | ICD-10-CM | POA: Diagnosis present

## 2024-02-28 DIAGNOSIS — Z860100 Personal history of colon polyps, unspecified: Secondary | ICD-10-CM

## 2024-02-28 DIAGNOSIS — J449 Chronic obstructive pulmonary disease, unspecified: Secondary | ICD-10-CM | POA: Diagnosis present

## 2024-02-28 DIAGNOSIS — Z9841 Cataract extraction status, right eye: Secondary | ICD-10-CM

## 2024-02-28 DIAGNOSIS — Z79899 Other long term (current) drug therapy: Secondary | ICD-10-CM

## 2024-02-28 DIAGNOSIS — Z87891 Personal history of nicotine dependence: Secondary | ICD-10-CM | POA: Diagnosis not present

## 2024-02-28 DIAGNOSIS — Z8546 Personal history of malignant neoplasm of prostate: Secondary | ICD-10-CM

## 2024-02-28 LAB — CBC WITH DIFFERENTIAL/PLATELET
Abs Immature Granulocytes: 0.01 K/uL (ref 0.00–0.07)
Basophils Absolute: 0 K/uL (ref 0.0–0.1)
Basophils Relative: 0 %
Eosinophils Absolute: 0.2 K/uL (ref 0.0–0.5)
Eosinophils Relative: 4 %
HCT: 21.9 % — ABNORMAL LOW (ref 39.0–52.0)
Hemoglobin: 7 g/dL — ABNORMAL LOW (ref 13.0–17.0)
Immature Granulocytes: 0 %
Lymphocytes Relative: 24 %
Lymphs Abs: 1 K/uL (ref 0.7–4.0)
MCH: 30.6 pg (ref 26.0–34.0)
MCHC: 32 g/dL (ref 30.0–36.0)
MCV: 95.6 fL (ref 80.0–100.0)
Monocytes Absolute: 0.4 K/uL (ref 0.1–1.0)
Monocytes Relative: 9 %
Neutro Abs: 2.7 K/uL (ref 1.7–7.7)
Neutrophils Relative %: 63 %
Platelets: 142 K/uL — ABNORMAL LOW (ref 150–400)
RBC: 2.29 MIL/uL — ABNORMAL LOW (ref 4.22–5.81)
RDW: 15 % (ref 11.5–15.5)
WBC: 4.3 K/uL (ref 4.0–10.5)
nRBC: 0 % (ref 0.0–0.2)

## 2024-02-28 LAB — BASIC METABOLIC PANEL WITH GFR
Anion gap: 11 (ref 5–15)
BUN: 79 mg/dL — ABNORMAL HIGH (ref 8–23)
CO2: 19 mmol/L — ABNORMAL LOW (ref 22–32)
Calcium: 8.9 mg/dL (ref 8.9–10.3)
Chloride: 109 mmol/L (ref 98–111)
Creatinine, Ser: 4.33 mg/dL — ABNORMAL HIGH (ref 0.61–1.24)
GFR, Estimated: 13 mL/min — ABNORMAL LOW (ref >60)
Glucose, Bld: 249 mg/dL — ABNORMAL HIGH (ref 70–99)
Potassium: 4.2 mmol/L (ref 3.5–5.1)
Sodium: 139 mmol/L (ref 135–145)

## 2024-02-28 LAB — GLUCOSE, CAPILLARY
Glucose-Capillary: 173 mg/dL — ABNORMAL HIGH (ref 70–99)
Glucose-Capillary: 154 mg/dL — ABNORMAL HIGH (ref 70–99)

## 2024-02-28 LAB — PREPARE RBC (CROSSMATCH)

## 2024-02-28 MED ORDER — HYDRALAZINE HCL 25 MG PO TABS
25.0000 mg | ORAL_TABLET | Freq: Three times a day (TID) | ORAL | Status: DC
  Administered 2024-02-28 – 2024-03-03 (×13): 25 mg via ORAL
  Filled 2024-02-28 (×12): qty 1

## 2024-02-28 MED ORDER — ATORVASTATIN CALCIUM 80 MG PO TABS
80.0000 mg | ORAL_TABLET | Freq: Every day | ORAL | Status: DC
  Administered 2024-02-29 – 2024-03-03 (×3): 80 mg via ORAL
  Filled 2024-02-28 (×3): qty 1

## 2024-02-28 MED ORDER — AMLODIPINE BESYLATE 10 MG PO TABS
10.0000 mg | ORAL_TABLET | Freq: Every day | ORAL | Status: DC
  Administered 2024-02-29 – 2024-03-03 (×3): 10 mg via ORAL
  Filled 2024-02-28 (×3): qty 1

## 2024-02-28 MED ORDER — THIAMINE MONONITRATE 100 MG PO TABS
100.0000 mg | ORAL_TABLET | Freq: Every day | ORAL | Status: DC
  Administered 2024-02-29 – 2024-03-03 (×3): 100 mg via ORAL
  Filled 2024-02-28 (×3): qty 1

## 2024-02-28 MED ORDER — ACETAMINOPHEN 650 MG RE SUPP: 650.0000 mg | Freq: Four times a day (QID) | RECTAL | Status: DC | PRN

## 2024-02-28 MED ORDER — INSULIN ASPART 100 UNIT/ML IJ SOLN
0.0000 [IU] | INTRAMUSCULAR | Status: DC
  Administered 2024-02-28 (×2): 1 [IU] via SUBCUTANEOUS
  Administered 2024-02-29: 2 [IU] via SUBCUTANEOUS
  Administered 2024-02-29: 5 [IU] via SUBCUTANEOUS
  Administered 2024-03-01: 3 [IU] via SUBCUTANEOUS
  Administered 2024-03-01: 4 [IU] via SUBCUTANEOUS
  Administered 2024-03-02: 1 [IU] via SUBCUTANEOUS
  Filled 2024-02-28: qty 1
  Filled 2024-02-28: qty 5
  Filled 2024-02-28: qty 4
  Filled 2024-02-28: qty 3
  Filled 2024-02-28: qty 1

## 2024-02-28 MED ORDER — ASPIRIN 81 MG PO TBEC
81.0000 mg | DELAYED_RELEASE_TABLET | Freq: Every day | ORAL | Status: DC
  Administered 2024-02-28 – 2024-03-03 (×4): 81 mg via ORAL
  Filled 2024-02-28 (×3): qty 1

## 2024-02-28 MED ORDER — ACETAMINOPHEN 325 MG PO TABS
650.0000 mg | ORAL_TABLET | Freq: Four times a day (QID) | ORAL | Status: DC | PRN
  Administered 2024-03-02: 650 mg via ORAL
  Filled 2024-02-28: qty 2

## 2024-02-28 MED ORDER — PANTOPRAZOLE SODIUM 40 MG PO TBEC
40.0000 mg | DELAYED_RELEASE_TABLET | Freq: Two times a day (BID) | ORAL | Status: DC
  Administered 2024-02-28 – 2024-03-03 (×7): 40 mg via ORAL
  Filled 2024-02-28 (×7): qty 1

## 2024-02-28 MED ORDER — SODIUM CHLORIDE 0.9% IV SOLUTION: Freq: Once | INTRAVENOUS | Status: AC

## 2024-02-28 NOTE — Plan of Care (Signed)
 FMTS Brief Progress Note  S: Gregory Nunez. is a 78 y.o. male with hx of chronic GI bleed, CVA, dementia, HTN presenting from long term care facility sent by outpatient GI for acute anemia. Pt c/o nose bleed but the bleeding is now stopped. He rest comfortable in bed w/o any SE from blood transfusion   O: BP 138/81   Pulse 82   Temp 98.1 F (36.7 C) (Oral)   Resp 18   Ht 5' 9 (1.753 m)   Wt 67.3 kg   SpO2 100%   BMI 21.91 kg/m   Physical Exam HENT:     Nose:     Comments: Dried blood on bilateral nasal  Cardiovascular:     Rate and Rhythm: Normal rate.     Pulses: Normal pulses.  Pulmonary:     Effort: Pulmonary effort is normal.  Abdominal:     Palpations: Abdomen is soft.  Skin:    General: Skin is warm and dry.  Neurological:     General: No focal deficit present.     Mental Status: He is oriented to person, place, and time.      Assessment & Plan Acute on chronic anemia AVM (arteriovenous malformation) of small bowel, acquired with hemorrhage - Completed blood transfusion - Pending  post-transfusion H/H - NPO except sips with meds - AM CBC, BMP - Pain : Tylenol  650 mg every 6 hours as needed for pain - Continue home Protonix  40 BID  - Home Octreotide  injection every 28 days, last given 10/21    Gregory Houston NOVAK, DO 02/28/2024, 7:55 PM PGY-1, [facility] Night Resident  Please page [phone] with questions.

## 2024-02-28 NOTE — Assessment & Plan Note (Addendum)
 Well-controlled, currently not on medication. Last A1c 5.1 in 09/2023.  -Sensitive sliding scale insulin  during admission -CBG qACHS

## 2024-02-28 NOTE — Assessment & Plan Note (Addendum)
 HTN: Continue home amlodipine  10 mg daily, continue home hydralazine  25 mg every 8 hours CVA: Continue home aspirin  81 mg daily HLD: Continue home atorvastatin  80 mg daily GERD: Continue home pantoprazole  40 mg twice daily B1 deficiency: Continue home thiamine  tablet 100 mg daily Chronic Hep C: Hold home Mavyret  100-40mg ; started in August, no recent dispenses, suspect patient completed recent therapy and will hold off during admission at this time

## 2024-02-28 NOTE — ED Triage Notes (Signed)
 Patient arrives via [facility] from [facility] for abnormal labs. Patient went to GI doctor yesterday, Hgb 7.4 noted, liver and creatinine were abnormal. Patient alert GCS 14. Hx of same.   EMS vitals 150/96 BP 98 O2 86 HR 16 RR

## 2024-02-28 NOTE — ED Notes (Signed)
 Pt stated he had a nose bleed. Tried to clean pt up and place him in a gown, but he refused. Pod RN made aware.

## 2024-02-28 NOTE — H&P (Addendum)
 Hospital Admission History and Physical Service Pager: [phone]  Patient name: Gregory Nunez. Medical record number: [id number] Date of Birth: [birth year] Age: 78 y.o. Gender: male  Primary Care Provider: Charmayne Holmes, DO Consultants: GI Code Status: Full which was confirmed with patient and HCPOA Preferred Emergency Contact:  Zinedine Ellner [place], [place]) [phone]  Chief Complaint: acute anemia  Medical Decision Making: Gregory Nunez. is a 78 y.o. male with hx of chronic GI bleed, CVA, dementia, HTN presenting from long term care facility sent by outpatient GI for acute anemia, Hgb 7.4. Differential for presentation of this includes:  Chronic duodenal AVMs: Most likely etiology with prior history and similar presenting prior GI bleeds in this patient Peptic ulcer disease with acute upper GI bleeding: Increased risk in older adults especially those with prior GI bleed such as this patient, and can be asymptomatic as presented by this patient. Lower GI bleed: Most common cause of GI bleeding which can present as painless hematochezia and acute anemia however this patient is not exhibiting signs of hematochezia. GI malignancy: Less likely without family history or personal history or hematochezia. Assessment & Plan Acute anemia Sent for evaluation by GI provider for hemoglobin of 7.4, 7.0 on repeat. Patient is asymptomatic. Differentials as above.  -- Admit to FMTS, attending Dr Anders, MedSurg floor  -- Appreciate GI recommendations (consulted by EDP)  - NPO except sips with meds - AM CBC, BMP - Transfuse 2 unit PRBC; post transfusion H/H - Tylenol  650 mg every 6 hours as needed for pain - Follows up with [facility] -- Continue home Protonix  40 BID  - Home Octreotide  injection every 28 days, last given 10/21 Controlled type 2 diabetes mellitus without complication, without long-term current use of insulin  (HCC) Well-controlled, currently not  on medication. Last A1c 5.1 in 09/2023.  -Sensitive sliding scale insulin  during admission -CBG qACHS CKD (chronic kidney disease), stage V (HCC) Creatinine of 4.33 on arrival which appears around patient baseline. - Continue to monitor, AM BMP - Encourage p.o. hydration when back on diet Chronic health problem HTN: Continue home amlodipine  10 mg daily, continue home hydralazine  25 mg every 8 hours CVA: Continue home aspirin  81 mg daily HLD: Continue home atorvastatin  80 mg daily GERD: Continue home pantoprazole  40 mg twice daily B1 deficiency: Continue home thiamine  tablet 100 mg daily Chronic Hep C: Hold home Mavyret  100-40mg ; started in August, no recent dispenses, suspect patient completed recent therapy and will hold off during admission at this time   FEN/GI: NPO, sips with meds VTE Prophylaxis: SCDs  Disposition: admit to medsurg  History of Present Illness:  Gregory Sayed. is a 78 y.o. male presenting for acute anemia found on standard labs by GI provider and sent to ED for evaluation.  Patient has no complaints or concerns at this time.  He is completely asymptomatic.  He has not noticed any hematochezia, hematemesis, hemoptysis, hematuria, abdominal pain, or any other signs of internal bleeding.  Patient resides full-time in a skilled nursing facility called the [facility].    In the ED, labs were significant for an initial hemoglobin of 7.4.  This decreased to a hemoglobin of 7.0 on repeat labs.  Glucose 249, platelets 142, creatinine 4.33, GFR 13.  2 units of PRBC were ordered in the ED.  Review Of Systems: Per HPI  Pertinent Past Medical History: Phonic duodenal AVMs Prior GI bleeds CKD stage V HTN CVA HLD GERD Thiamine   deficiency Chronic hep C Remainder reviewed in history tab.   Pertinent Past Surgical History:  Remainder reviewed in history tab.   Pertinent Social History: Tobacco use: Yes/No/Former Alcohol use: Former Other  Substance use: none Lives at full-time skilled nursing facility: [facility]  Pertinent Family History: Remainder reviewed in history tab.   Important Outpatient Medications: Amlodipine  10 mg Aspirin  81 mg Atorvastatin  80 mg Hydralazine  25 mg May be ret 100-40 mg Multivitamin Pantoprazole  40 mg Sandostatin  injection 20 mg every 28 days Senokot Vitamin B1 Vitamin D Remainder reviewed in medication history.   Objective: BP 134/76 (BP Location: Right Arm)   Pulse 84   Temp 98.4 F (36.9 C) (Oral)   Resp 18   Ht 5' 9 (1.753 m)   Wt 70.8 kg   SpO2 100%   BMI 23.04 kg/m  Exam: General: Well-appearing male lying supine comfortably in hospital bed in no acute distress Eyes: Nonicteric sclera, EOMI ENTM: Clear nares, clear oropharynx, edentulous Neck: Supple, full range of motion Cardiovascular: Distant heart sounds, no murmurs, 2+ radial pulses Respiratory: CTAB, normal work of breathing on room air, no W/R/R Gastrointestinal: Soft, nontender, nondistended, BS present MSK: Moves all extremities equally, no peripheral edema Derm: No rashes, lesions, or ecchymoses appreciated Neuro: Nerves III to XII grossly intact, dysarthric (s/p CVA) Psych: Appropriate mood and affect  Labs:  CBC BMET  Recent Labs  Lab 02/28/24 1040  WBC 4.3  HGB 7.0*  HCT 21.9*  PLT 142*   Recent Labs  Lab 02/28/24 1040  NA 139  K 4.2  CL 109  CO2 19*  BUN 79*  CREATININE 4.33*  GLUCOSE 249*  CALCIUM  8.9     EKG: No significant change since prior 6/25  Lupie Credit, DO 02/28/2024, 2:19 PM PGY-1, [facility]  FPTS Intern pager: [phone], text pages welcome Secure chat group [facility] Teaching Service   I have verified that the service and findings are accurately documented in the resident's note above.  Damien Cassis, MD                  02/28/2024, 4:06 PM

## 2024-02-28 NOTE — Consult Note (Addendum)
 Consultation Note   Referring Provider:   Emergency Services PCP: Charmayne Holmes, DO Primary Gastroenterologist:  Elspeth Naval, MD      Reason for Consultation:  Anemia DOA: 02/28/2024         Hospital Day: 1   ATTENDING ADDENDUM I personally saw the patient and performed a substantive portion of this encounter (>50% time spent), including a complete performance of at least one of the key components (MDM, Hx and/or Exam), in conjunction with Vina Dasen.  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  I performed his last enteroscopy in June where multiple AVMs were ablated.  He had been placed on octreotide  Depo injections monthly to help reduce his risk for further bleeding.  He was seen in the office for routine follow-up yesterday, routine labs obtained and showed a hemoglobin of 7.4, down 2 points from his last draw in September.  He denies any overt GI bleeding.  Repeated in the ED, hemoglobin 7.0, got blood transfusion today.  He is hemodynamically stable, no active bleeding observed so far.  The patient is really not able to provide much history here due to his dementia.  I spoke with his cousin Jaymian Bogart and reviewed his recent course with him and options.  One option is to repeat an enteroscopy and ablate any AVMs seen, however unclear how much if any this will help him depending on if he has diffuse AVMs in the rest of the small bowel, or a dominant lesion elsewhere.  Capsule endoscopy has been recommended and attempted in the past, however he was not able to comply with keeping the leads on and this procedure was aborted the last time it was attempted.  I think this would be the most useful test to clarify burden of AVMs and assess for dominant lesion causing his symptoms if he is willing to comply with it.  The other option would be to transfuse him PRBC as needed and  keep a close eye on his blood counts as an outpatient.  I suspect he would continue to need blood transfusion once monthly or so over time without an intervention, given his track record over the past several months.  Following a good discussion with Ozell, he wants to try another attempt at capsule endoscopy if the patient is up for it.  He is going to have family come spend the day with the patient tomorrow to make sure he keeps the leads on and complies with the test.  I think that is reasonable.  Will give him clear liquids tonight, n.p.o. after midnight, place the capsule tomorrow.  Hopefully we can have a read back on Thursday to help make further recommendations.  If there is a dominant AVM we think causing majority of his anemia we can discuss if it is amenable to treatment or not.  If he has innumerable AVMs throughout his entire small bowel that would be much more difficult to treat with an intervention and would just continue medical therapy with octreotide  in that setting.  All questions answered, they agree with the plan.  > 75 minutes spent on this encounter.  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  Readmitted now with recurrent decline in hemoglobin, presumably in absence of overt GI bleeding Hemoglobin 7.4, down from 9.3 mid September.  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   --Clear liquid diet now --NPO after MN  --Monitor H/H and transfuse hgg <7 --Continue BID Pantprazole  HPI   Brief history  Mr. 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 Lesions have been treated with APC on multiple occasions and he is on monthly Sandostatin  injections.  Recent history includes more stress 2 small bowel endoscopies with ablation of multiple angioectasias of the small bowel .  Following that he was readmitted early August with a recurrent decline in hemoglobin.  Plan was for small bowel video capsule study.  Patient agreed to study but then he removed the camera leads and equipment about 1 hour into the study. He was readmitted in mid September 2025.  He was transfused 2 units of RBCs, endoscopic evaluation not pursued  Interval history Patient was seen in our office for hospital follow-up yesterday. Labs remarkable for decline in hgb from 9.3 to 7.4. Ferritin 71, TIC 263, 29 % iron  sat, 78 serum iron . Our office contacted patient and asked they take him to ED.   In ED WBC normal. WBC 7, platelets 142, BUN 79 / Cr 4.3 ( around baseline)   History taking limited given patient's dementia but he denies blood in stool or dark stools. He denies abdominal pain, n/v or any other GI symptoms. His only complaint is that he is hungry    Pertinent GI Studies   April 2018 screening colonoscopy  -multiple polyps up to 6 mm. Path - TA and SSA   Dec 2019 EGD for IDA -Duodenal bulb AVM, non-bleeding. Ablated. Diffuse gastritis with erosions. 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 Five 2-4 mm AVMs in D2 (ablated), a larger AVM with oozing in D3 was ablated. Path - chronic gastritis, reactive changes. No H.pylori.    Feb 2022 colonoscopy with TI intubation to 2 cm for heme + anemia -Adequate bowel prep. Multiple polyps up to 6 mm. Small ascending colon lipoma. Polyp path - hyperplastic polyps, tubular adenomas   Small bowel enteroscopy 10/07/2020 for IDA - Normal esophagus.  - Normal stomach.  - Normal examined duodenum.  - The examined portion of the jejunum was normal.  - No specimens collected. NO ADDITOONAL AVMS SEEN - HAD SEVERAL ABLATED 2/ 2022   EGD 12/09/2020 for IDA - Z- line regular, 40 cm from the incisors. -  History of gastric  intestinal metaplsai. Biopsied.  - Two non- bleeding angioectasias in the duodenum. Treated with argon plasma coagulation ( APC) .  - A single duodenal polyp. Resected and retrieved.  - The examination was otherwise normal.   Small bowel enteroscopy 10/03/2023 for IDA - Normal esophagus.  - Moderate, patchy gastritis noted in the gastric fundus, gastric cardia, and proximal gastric body. This was biopsied.  - Minimal, nonulcer gastritis noted in the remainder of the stomach.  - Four non- bleeding angioectasias in the duodenum. Treated with argon plasma coagulation ( APC) .  - The examined portion of the jejunum was normal.  - Single small, shallow erosion in the duodenal bulb. There is no active bleeding or stigmata of recent bleeding.    Small bowel endoscopy/27/2025 - Normal esophagus.  - Erythematous mucosa in the antrum.  - Normal stomach otherwise.  - A single non- bleeding angiodysplastic lesion in the duodenum. Treated with argon plasma coagulation ( APC) .  - Five non- bleeding angiodysplastic lesions in the proximal duodenum. Treated with argon plasma coagulation ( APC) .  - Distal most extent of proximal jejunum reached was tattooed.  - Normal remainder of examined small bowel.   Labs and  Imaging:  Recent Labs    02/27/24 1510  PROT 8.0  ALBUMIN 3.7  AST 19  ALT 14  ALKPHOS 76  BILITOT 0.3   Recent Labs    02/27/24 1510 02/28/24 1040  WBC 4.2 4.3  HGB 7.4 Repeated and verified X2.* 7.0*  HCT 21.6 Repeated and verified X2.* 21.9*  MCV 91.6 95.6  PLT 145.0* 142*   Recent Labs    02/27/24 1510 02/28/24 1040  NA 138 139  K 4.3 4.2  CL 107 109  CO2 21 19*  GLUCOSE 261* 249*  BUN 80* 79*  CREATININE 4.33* 4.33*  CALCIUM  9.1 8.9    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)    Epilepsy (HCC)    GERD (gastroesophageal reflux disease)    if drinks alcohol   GI bleed 10/05/2020   HAV (hallux abducto valgus) 01/17/2013   Patient is approximately 5-week status post bunion correction left foot   Hyperlipidemia    Hypertension    Hypertensive retinopathy    OU   Hypoglycemia 10/22/2020   Malignant neoplasm of prostate (HCC) 01/09/2014   Neuropathy    Pancreatitis    Pneumococcal vaccination administered at current visit 11/10/2020   Prostate cancer (HCC) 12/19/2013   Gleason 4+3=7, volume 31.31 cc   Rhabdomyolysis 04/12/2021   Sciatica    Shortness of breath dyspnea    with exertion    Viral hepatitis C     Past Surgical History:  Procedure Laterality Date   BIOPSY  04/16/2018   Procedure: BIOPSY;  Surgeon: Kristie Lamprey, MD;   Location: [facility];  Service: Endoscopy;;   BIOPSY  05/23/2020  Procedure: BIOPSY;  Surgeon: Charlanne Groom, MD;  Location: [facility];  Service: Gastroenterology;;  EGD and COLON   BIOPSY  12/09/2020   Procedure: BIOPSY;  Surgeon: Eda Iha, MD;  Location: [facility] ENDOSCOPY;  Service: Gastroenterology;;   biopsy on throat     hx of    CATARACT EXTRACTION Bilateral    Dr. Medford Ferrier   COLONOSCOPY N/A 05/23/2020   Procedure: COLONOSCOPY;  Surgeon: Charlanne Groom, MD;  Location: [facility];  Service: Gastroenterology;  Laterality: N/A;   ENTEROSCOPY N/A 10/07/2020   Procedure: ENTEROSCOPY;  Surgeon: Avram Lupita BRAVO, MD;  Location: [facility]COPY;  Service: Endoscopy;  Laterality: N/A;   ENTEROSCOPY N/A 10/03/2023   Procedure: ENTEROSCOPY;  Surgeon: San Sandor GAILS, DO;  Location: [facility]OSCOPY;  Service: Gastroenterology;  Laterality: N/A;   ENTEROSCOPY N/A 10/14/2023   Procedure: ENTEROSCOPY;  Surgeon: Leigh Elspeth SQUIBB, MD;  Location: [facility] ENDOSCOPY;  Service: Gastroenterology;  Laterality: N/A;   ESOPHAGOGASTRODUODENOSCOPY Left 04/16/2018   Procedure: ESOPHAGOGASTRODUODENOSCOPY (EGD);  Surgeon: Kristie Lamprey, MD;  Location: [facility];  Service: Endoscopy;  Laterality: Left;   ESOPHAGOGASTRODUODENOSCOPY (EGD) WITH PROPOFOL  N/A 05/23/2020   Procedure: ESOPHAGOGASTRODUODENOSCOPY (EGD) WITH PROPOFOL ;  Surgeon: Charlanne Groom, MD;  Location: [facility];  Service: Gastroenterology;  Laterality: N/A;   ESOPHAGOGASTRODUODENOSCOPY (EGD) WITH PROPOFOL  N/A 12/09/2020   Procedure: ESOPHAGOGASTRODUODENOSCOPY (EGD) WITH PROPOFOL ;  Surgeon: Eda Iha, MD;  Location: [facility] ENDOSCOPY;  Service: Gastroenterology;  Laterality: N/A;   EYE SURGERY     FOOT SURGERY     GIVENS CAPSULE STUDY N/A 11/25/2023   Procedure: IMAGING PROCEDURE, GI TRACT, INTRALUMINAL, VIA CAPSULE;  Surgeon: Albertus Gordy HERO, MD;  Location: [facility]OS[facility];  Service: Gastroenterology;  Laterality: N/A;   HOT HEMOSTASIS N/A 04/16/2018    Procedure: HOT HEMOSTASIS (ARGON PLASMA COAGULATION/BICAP);  Surgeon: Kristie Lamprey, MD;  Location: [facility];  Service: Endoscopy;  Laterality: N/A;   HOT HEMOSTASIS N/A 05/23/2020   Procedure: HOT HEMOSTASIS (ARGON PLASMA COAGULATION/BICAP);  Surgeon: Charlanne Groom, MD;  Location: [facility];  Service: Gastroenterology;  Laterality: N/A;   HOT HEMOSTASIS N/A 12/09/2020   Procedure: HOT HEMOSTASIS (ARGON PLASMA COAGULATION/BICAP);  Surgeon: Eda Iha, MD;  Location: [facility] ENDOSCOPY;  Service: Gastroenterology;  Laterality: N/A;   HOT HEMOSTASIS N/A 10/03/2023   Procedure: EGD, WITH ARGON PLASMA COAGULATION;  Surgeon: San Sandor GAILS, DO;  Location: [facility];  Service: Gastroenterology;  Laterality: N/A;   HOT HEMOSTASIS N/A 10/14/2023   Procedure: EGD, WITH ARGON PLASMA COAGULATION;  Surgeon: Leigh Elspeth SQUIBB, MD;  Location: [facility] ENDOSCOPY;  Service: Gastroenterology;  Laterality: N/A;   LYMPHADENECTOMY Bilateral 02/27/2014   Procedure: BILATERAL LYMPHADENECTOMY;  Surgeon: Ricardo Likens, MD;  Location: [facility];  Service: Urology;  Laterality: Bilateral;   POLYPECTOMY  05/23/2020   Procedure: POLYPECTOMY;  Surgeon: Charlanne Groom, MD;  Location: [facility];  Service: Gastroenterology;;   PROSTATE BIOPSY  12/2013   Gleason 4+3=7, volume 31.31 cc   ROBOT ASSISTED LAPAROSCOPIC RADICAL PROSTATECTOMY N/A 02/27/2014   Procedure: ROBOTIC ASSISTED LAPAROSCOPIC RADICAL PROSTATECTOMY WITH INDOCYANINE GREEN DYE;  Surgeon: Ricardo Likens, MD;  Location: [facility];  Service: Urology;  Laterality: N/A;    Family History  Problem Relation Age of Onset   Heart disease Mother    Heart attack Father 6   Cancer Sister        breast   Colon cancer Neg Hx    Esophageal cancer Neg Hx    Rectal cancer Neg Hx    Stomach cancer Neg Hx  Prior to Admission medications   Medication Sig Start Date End Date Taking? Authorizing Provider  acetaminophen  (TYLENOL ) 325 MG tablet Take 2 tablets (650 mg  total) by mouth every 6 (six) hours as needed for mild pain (pain score 1-3) or fever (or Fever >/= 101). 01/06/24  Yes Syeda, Raeeha, DO  amLODipine  (NORVASC ) 10 MG tablet Take 10 mg by mouth daily.   Yes [provider]  aspirin  EC 81 MG tablet Take 1 tablet (81 mg total) by mouth daily. Swallow whole. 09/25/23  Yes Tawkaliyar, Roya, DO  atorvastatin  (LIPITOR ) 80 MG tablet Take 1 tablet (80 mg total) by mouth daily. 09/25/23  Yes Tawkaliyar, Roya, DO  Glecaprevir -Pibrentasvir  (MAVYRET ) 100-40 MG TABS Take 3 tablets by mouth daily. 01/06/24 03/02/24 Yes Syeda, Raeeha, DO  hydrALAZINE  (APRESOLINE ) 25 MG tablet Take 1 tablet (25 mg total) by mouth every 8 (eight) hours. 10/14/22  Yes Lou Claretta HERO, MD  Multiple Vitamin (MULTIVITAMIN WITH MINERALS) TABS tablet Take 1 tablet by mouth every morning.   Yes [provider]  octreotide  (SANDOSTATIN  LAR) 20 MG injection Inject 20 mg into the muscle every 28 (twenty-eight) days. 10/17/23  Yes Elnora Ip, MD  pantoprazole  (PROTONIX ) 40 MG tablet Take 1 tablet (40 mg total) by mouth 2 (two) times daily. 10/17/23  Yes Elnora Ip, MD  sennosides-docusate sodium  (SENOKOT-S) 8.6-50 MG tablet Take 1 tablet by mouth daily.   Yes [provider]  thiamine  (VITAMIN B-1) 100 MG tablet Take 1 tablet (100 mg total) by mouth daily. 10/17/23  Yes Gomez-Caraballo, Maria, MD  Vitamin D, Ergocalciferol, (DRISDOL) 1.25 MG (50000 UNIT) CAPS capsule Take 50,000 Units by mouth once a week. 12/18/23  Yes [provider]  COMBIVENT RESPIMAT 20-100 MCG/ACT AERS respimat Inhale 1 puff into the lungs every 6 (six) hours as needed for shortness of breath. 06/12/18 08/19/19  [provider]  mometasone -formoterol  (DULERA) 100-5 MCG/ACT AERO Inhale 2 puffs into the lungs daily. Patient not taking: Reported on 08/19/2019 04/17/18 08/19/19  Rosario Eland I, MD  sucralfate  (CARAFATE ) 1 g tablet Take 1 tablet (1 g total) by mouth 4  (four) times daily. Patient not taking: Reported on 08/19/2019 04/17/18 08/19/19  Rosario Eland I, MD  tiotropium (SPIRIVA  HANDIHALER) 18 MCG inhalation capsule Place 1 capsule (18 mcg total) into inhaler and inhale daily. Patient not taking: Reported on 08/19/2019 04/17/18 08/19/19  Rosario Eland I, MD  traZODone  (DESYREL ) 50 MG tablet Take 50 mg by mouth at bedtime as needed for sleep. Patient not taking: Reported on 10/06/2020  10/08/20  [provider]    Current Facility-Administered Medications  Medication Dose Route Frequency Provider Last Rate Last Admin   0.9 %  sodium chloride  infusion (Manually program via Guardrails IV Fluids)   Intravenous Once Sofia, Leslie K, PA-C       acetaminophen  (TYLENOL ) tablet 650 mg  650 mg Oral Q6H PRN Lupie Credit, DO       Or   acetaminophen  (TYLENOL ) suppository 650 mg  650 mg Rectal Q6H PRN Lupie Credit, DO       [START ON 02/29/2024] amLODipine  (NORVASC ) tablet 10 mg  10 mg Oral Daily Diona Perkins, MD       aspirin  EC tablet 81 mg  81 mg Oral Daily Diona Perkins, MD       atorvastatin  (LIPITOR ) tablet 80 mg  80 mg Oral Daily Diona Perkins, MD       hydrALAZINE  (APRESOLINE ) tablet 25 mg  25 mg Oral Q8H Diona Perkins,  MD       insulin  aspart (novoLOG ) injection 0-6 Units  0-6 Units Subcutaneous Q4H Diona Perkins, MD       pantoprazole  (PROTONIX ) EC tablet 40 mg  40 mg Oral BID Diona Perkins, MD       [START ON 02/29/2024] thiamine  (VITAMIN B1) tablet 100 mg  100 mg Oral Daily Diona Perkins, MD       Current Outpatient Medications  Medication Sig Dispense Refill   acetaminophen  (TYLENOL ) 325 MG tablet Take 2 tablets (650 mg total) by mouth every 6 (six) hours as needed for mild pain (pain score 1-3) or fever (or Fever >/= 101). 30 tablet 0   amLODipine  (NORVASC ) 10 MG tablet Take 10 mg by mouth daily.     aspirin  EC 81 MG tablet Take 1 tablet (81 mg total) by mouth daily. Swallow whole.     atorvastatin  (LIPITOR ) 80 MG tablet Take 1 tablet (80 mg total)  by mouth daily.     Glecaprevir -Pibrentasvir  (MAVYRET ) 100-40 MG TABS Take 3 tablets by mouth daily.     hydrALAZINE  (APRESOLINE ) 25 MG tablet Take 1 tablet (25 mg total) by mouth every 8 (eight) hours. 270 tablet 3   Multiple Vitamin (MULTIVITAMIN WITH MINERALS) TABS tablet Take 1 tablet by mouth every morning.     octreotide  (SANDOSTATIN  LAR) 20 MG injection Inject 20 mg into the muscle every 28 (twenty-eight) days. 1 each 2   pantoprazole  (PROTONIX ) 40 MG tablet Take 1 tablet (40 mg total) by mouth 2 (two) times daily.     sennosides-docusate sodium  (SENOKOT-S) 8.6-50 MG tablet Take 1 tablet by mouth daily.     thiamine  (VITAMIN B-1) 100 MG tablet Take 1 tablet (100 mg total) by mouth daily.     Vitamin D, Ergocalciferol, (DRISDOL) 1.25 MG (50000 UNIT) CAPS capsule Take 50,000 Units by mouth once a week.      Allergies as of 02/28/2024 - Review Complete 02/28/2024  Allergen Reaction Noted   Penicillins Anaphylaxis and Other (See Comments) 05/16/2011    Social History   Socioeconomic History   Marital status: Married    Spouse name: Not on file   Number of children: Not on file   Years of education: Not on file   Highest education level: Not on file  Occupational History   Not on file  Tobacco Use   Smoking status: Former    Current packs/day: 0.50    Types: Cigarettes   Smokeless tobacco: Never   Tobacco comments:    1 ppd Wants Patches .  Stopped 1.5 weeks ago   Vaping Use   Vaping status: Never Used  Substance and Sexual Activity   Alcohol use: Not Currently    Alcohol/week: 1.0 standard drink of alcohol    Types: 1 Cans of beer per week   Drug use: Not Currently    Types: Marijuana, Cocaine, Heroin    Comment: past hx approx 30 years ago    Sexual activity: Not on file  Other Topics Concern   Not on file  Social History Narrative   Not on file   Social Drivers of Health   Financial Resource Strain: Low Risk  (09/21/2023)   Overall Financial Resource Strain  (CARDIA)    Difficulty of Paying Living Expenses: Not hard at all  Food Insecurity: Patient Unable To Answer (01/05/2024)   Hunger Vital Sign    Worried About Running Out of Food in the Last Year: Patient unable to answer    Ran Out  of Food in the Last Year: Patient unable to answer  Transportation Needs: No Transportation Needs (01/05/2024)   PRAPARE - Administrator, Civil Service (Medical): No    Lack of Transportation (Non-Medical): No  Physical Activity: Insufficiently Active (09/21/2023)   Exercise Vital Sign    Days of Exercise per Week: 2 days    Minutes of Exercise per Session: 30 min  Stress: No Stress Concern Present (09/21/2023)   [employer] of Occupational Health - Occupational Stress Questionnaire    Feeling of Stress : Not at all  Social Connections: Patient Unable To Answer (01/05/2024)   Social Connection and Isolation Panel    Frequency of Communication with Friends and Family: Patient unable to answer    Frequency of Social Gatherings with Friends and Family: Patient unable to answer    Attends Religious Services: Patient unable to answer    Active Member of Clubs or Organizations: Patient unable to answer    Attends Banker Meetings: Patient unable to answer    Marital Status: Patient unable to answer  Intimate Partner Violence: Not At Risk (01/05/2024)   Humiliation, Afraid, Rape, and Kick questionnaire    Fear of Current or Ex-Partner: No    Emotionally Abused: No    Physically Abused: No    Sexually Abused: No     Code Status   Code Status: Full Code  Review of Systems: All systems reviewed and negative except where noted in HPI.  Physical Exam: Vital signs in last 24 hours: Temp:  [98.4 F (36.9 C)-98.6 F (37 C)] 98.6 F (37 C) (11/11 1548) Pulse Rate:  [84-89] 89 (11/11 1515) Resp:  [18] 18 (11/11 1017) BP: (134-145)/(76-77) 145/77 (11/11 1515) SpO2:  [100 %] 100 % (11/11 1515) Weight:  [70.8 kg] 70.8 kg (11/11 1024)     General:  Pleasant male in NAD Psych:  Cooperative. Normal mood and affect Eyes: Pupils equal Ears:  Normal auditory acuity Nose: No deformity, discharge or lesions Neck:  Supple, no masses felt Lungs:  Clear to auscultation.  Heart:  Regular rate, regular rhythm.  Abdomen:  Soft, nondistended, nontender, active bowel sounds, no masses felt Rectal :  Deferred Msk: Symmetrical without gross deformities.  Neurologic:  Alert, oriented to place but not year.  Extremities : No edema Skin:  Intact without significant lesions.    Intake/Output from previous day: No intake/output data recorded. Intake/Output this shift:  No intake/output data recorded.   Vina Dasen, NP-C   02/28/2024, 3:53 PM

## 2024-02-28 NOTE — Hospital Course (Addendum)
 Gregory Nunez. 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 His hospital course is detailed below:  Acute on chronic anemia AVM (arteriovenous malformation) of small bowel, acquired with hemorrhage [name] was sent to the [facility] ED after outpatient GI provider found hemoglobin of 7.4 on lab work.  The patient was asymptomatic.  On arrival, admission hemoglobin was 7.0.  The patient received 2 units PRBC and was followed by GI.  He received capsule endoscopy 11/12 with shows multiple intestinal AVMs causing chronic blood loss which he undergone for Intestinal AVM ablation in the stomach, duodenum, and jejunum on 11/14. His hospital course was uneventful after the procedure he will need to continue Protonix  and octreotide  depot injections at home and f/u with outpatient GI in 1 week after discharge. Patient was discharged with stable hemoglobin   Controlled type 2 diabetes mellitus without complication, without long-term current use of insulin   Well-controlled, currently not on medication. Last A1c 5.1 in 09/2023.  Patient was placed on sensitive sliding scale insulin  during admission without complications.  Chronic hepatitis C  History of chronic hepatitis C with 6-week treatment with Mavyret  08-12/2023 confirmed with patient's primary nurse Antoinette at his long-term healthcare facility [facility].  An HCV RNA was checked on 02/29/2024 and was not detected   CKD (chronic kidney disease), stage V  Creatinine of 4.33 on admission which was consistent with patient baseline. Not on dialysis.  Other chronic conditions were medically managed with home medications and formulary alternatives as necessary (hypertension, CVA, HLD, GERD, vitamin B1 deficiency)  PCP Follow-up  Recommendations: Ensure GI follow-up in 1 week Ensure Nephrology follow up for CKD  ID follow-up for HCV management

## 2024-02-28 NOTE — Assessment & Plan Note (Signed)
-   Completed blood transfusion - Pending  post-transfusion H/H - NPO except sips with meds - AM CBC, BMP - Pain : Tylenol  650 mg every 6 hours as needed for pain - Continue home Protonix  40 BID  - Home Octreotide  injection every 28 days, last given 10/21

## 2024-02-28 NOTE — ED Notes (Signed)
 Awaiting provider response to advise how to proceed with blood transfusion as patient has cognitive impairment.

## 2024-02-28 NOTE — Assessment & Plan Note (Addendum)
 Sent for evaluation by GI provider for hemoglobin of 7.4, 7.0 on repeat. Patient is asymptomatic. Differentials as above.  -- Admit to FMTS, attending Dr Anders, MedSurg floor  -- Appreciate GI recommendations (consulted by EDP)  - NPO except sips with meds - AM CBC, BMP - Transfuse 2 unit PRBC; post transfusion H/H - Tylenol  650 mg every 6 hours as needed for pain - Follows up with  GI outpatient -- Continue home Protonix  40 BID  - Home Octreotide  injection every 28 days, last given 10/21

## 2024-02-28 NOTE — Plan of Care (Signed)
 Called and spoke with patient's nephew Martel Galvan, who identifies as patient's healthcare power of attorney.  Discussed current care plan for patient.  Discussed recommendation for blood transfusion.  Discussed risks and benefits of blood transfusion.  Ozell Louder, patient's HCPOA, consents to patient receiving blood products at this time.

## 2024-02-28 NOTE — Assessment & Plan Note (Addendum)
 Creatinine of 4.33 on arrival which appears around patient baseline. - Continue to monitor, AM BMP - Encourage p.o. hydration when back on diet

## 2024-02-28 NOTE — ED Provider Notes (Signed)
 [place] EMERGENCY DEPARTMENT AT [facility] Provider Note   CSN: [id number] Arrival date & time: 02/28/24  1010     Patient presents with: No chief complaint on file.   Gregory Nunez. is a 78 y.o. male.   Pt has a past medical history of chronic gi bleeding.  Pt has had multiple GI evaluations and multiple admissions.  Pt's hemoglobin was tested yesterday and was 7.4.  Gi provider advised facility to send pt to ED for evaluation.  Pt has a history of dementia. Pt has required transfusions in the past.   The history is provided by the patient. No language interpreter was used.       Prior to Admission medications   Medication Sig Start Date End Date Taking? Authorizing Provider  acetaminophen  (TYLENOL ) 325 MG tablet Take 2 tablets (650 mg total) by mouth every 6 (six) hours as needed for mild pain (pain score 1-3) or fever (or Fever >/= 101). 01/06/24   Syeda, Raeeha, DO  amLODipine  (NORVASC ) 10 MG tablet Take 10 mg by mouth daily.    [provider]  aspirin  EC 81 MG tablet Take 1 tablet (81 mg total) by mouth daily. Swallow whole. 09/25/23   Tawkaliyar, Roya, DO  atorvastatin  (LIPITOR ) 80 MG tablet Take 1 tablet (80 mg total) by mouth daily. 09/25/23   Tawkaliyar, Roya, DO  Glecaprevir -Pibrentasvir  (MAVYRET ) 100-40 MG TABS Take 3 tablets by mouth daily. 01/06/24 03/02/24  Syeda, Raeeha, DO  hydrALAZINE  (APRESOLINE ) 25 MG tablet Take 1 tablet (25 mg total) by mouth every 8 (eight) hours. 10/14/22   Lou Claretta HERO, MD  Multiple Vitamin (MULTIVITAMIN WITH MINERALS) TABS tablet Take 1 tablet by mouth every morning.    [provider]  Nutritional Supplements (NUTRITIONAL DRINK) LIQD Take 90 mLs by mouth in the morning, at noon, and at bedtime. Med Plus 2.0    [provider]  octreotide  (SANDOSTATIN  LAR) 20 MG injection Inject 20 mg into the muscle every 28 (twenty-eight) days. 10/17/23   Elnora Ip, MD  pantoprazole  (PROTONIX )  40 MG tablet Take 1 tablet (40 mg total) by mouth 2 (two) times daily. 10/17/23   Elnora Ip, MD  sennosides-docusate sodium  (SENOKOT-S) 8.6-50 MG tablet Take 1 tablet by mouth daily.    [provider]  thiamine  (VITAMIN B-1) 100 MG tablet Take 1 tablet (100 mg total) by mouth daily. 10/17/23   Gomez-Caraballo, Maria, MD  Vitamin D, Ergocalciferol, (DRISDOL) 1.25 MG (50000 UNIT) CAPS capsule Take 50,000 Units by mouth once a week. 12/18/23   [provider]  COMBIVENT RESPIMAT 20-100 MCG/ACT AERS respimat Inhale 1 puff into the lungs every 6 (six) hours as needed for shortness of breath. 06/12/18 08/19/19  [provider]  mometasone -formoterol  (DULERA) 100-5 MCG/ACT AERO Inhale 2 puffs into the lungs daily. Patient not taking: Reported on 08/19/2019 04/17/18 08/19/19  Rosario Eland I, MD  sucralfate  (CARAFATE ) 1 g tablet Take 1 tablet (1 g total) by mouth 4 (four) times daily. Patient not taking: Reported on 08/19/2019 04/17/18 08/19/19  Rosario Eland I, MD  tiotropium (SPIRIVA  HANDIHALER) 18 MCG inhalation capsule Place 1 capsule (18 mcg total) into inhaler and inhale daily. Patient not taking: Reported on 08/19/2019 04/17/18 08/19/19  Rosario Eland I, MD  traZODone  (DESYREL ) 50 MG tablet Take 50 mg by mouth at bedtime as needed for sleep. Patient not taking: Reported on 10/06/2020  10/08/20  [provider]    Allergies: Penicillins    Review of Systems  All other systems reviewed and are negative.   Updated Vital Signs BP 134/76 (BP Location: Right Arm)   Pulse 84   Temp 98.4 F (36.9 C) (Oral)   Resp 18   Ht 5' 9 (1.753 m)   Wt 70.8 kg   SpO2 100%   BMI 23.04 kg/m   Physical Exam Vitals and nursing note reviewed.  Constitutional:      Appearance: He is well-developed.  HENT:     Head: Normocephalic.     Mouth/Throat:     Mouth: Mucous membranes are moist.  Cardiovascular:     Rate and Rhythm: Normal rate.  Pulmonary:      Effort: Pulmonary effort is normal.  Abdominal:     General: There is no distension.  Musculoskeletal:        General: Normal range of motion.     Cervical back: Normal range of motion.  Skin:    General: Skin is warm.  Neurological:     General: No focal deficit present.     Mental Status: He is alert. He is disoriented.     Comments: Pt can exchange greetings, pleasant,      (all labs ordered are listed, but only abnormal results are displayed) Labs Reviewed  CBC WITH DIFFERENTIAL/PLATELET - Abnormal; Notable for the following components:      Result Value   RBC 2.29 (*)    Hemoglobin 7.0 (*)    HCT 21.9 (*)    Platelets 142 (*)    All other components within normal limits  BASIC METABOLIC PANEL WITH GFR - Abnormal; Notable for the following components:   CO2 19 (*)    Glucose, Bld 249 (*)    BUN 79 (*)    Creatinine, Ser 4.33 (*)    GFR, Estimated 13 (*)    All other components within normal limits  TYPE AND SCREEN    EKG: None  Radiology: No results found.   .Critical Care  Performed by: Flint Sonny POUR, PA-C Authorized by: Flint Sonny POUR, PA-C   Critical care provider statement:    Critical care time (minutes):  30   Critical care start time:  02/28/2024 10:30 AM   Critical care end time:  02/28/2024 12:28 PM   Critical care time was exclusive of:  Separately billable procedures and treating other patients and teaching time   Critical care was time spent personally by me on the following activities:  Ordering and performing treatments and interventions, ordering and review of laboratory studies, re-evaluation of patient's condition and review of old charts   I assumed direction of critical care for this patient from another provider in my specialty: no     Care discussed with: admitting provider      Medications Ordered in the ED - No data to display                                  Medical Decision Making Pt sent to the ED for decreased hemoglobin.     Amount and/or Complexity of Data Reviewed Independent Historian:     Details: Pt's facility notes and Gi notes reviewed.  External Data Reviewed: notes.    Details: Internal medicine and GI notes reviewed  Labs: ordered. Decision-making details documented in ED Course.    Details: Labs ordered reviewed and interpreted. CBC  hemoglobin is 7.0. Glucose is 249. Creatine is 4.33 GFR is 13. Discussion of management  or test interpretation with external provider(s): I consulted internal medicine.  Pt is no longer an Internal medicine patient.  He is in a SNF.  Unassigned medicine consulted to admit. GI consulted to consult  Risk Prescription drug management. Decision regarding hospitalization.  Critical Care Total time providing critical care: 30 minutes        Final diagnoses:  Iron  deficiency anemia due to chronic blood loss    ED Discharge Orders     None          Cliffton Spradley K, PA-C 02/28/24 1228    Simon Lavonia SAILOR, MD 02/28/24 1536

## 2024-02-29 ENCOUNTER — Encounter (HOSPITAL_COMMUNITY)
Admission: EM | Disposition: A | Discharge status: Skilled Nursing Facility | Payer: Self-pay | Source: Skilled Nursing Facility | Attending: Family Medicine

## 2024-02-29 DIAGNOSIS — D649 Anemia, unspecified: Secondary | ICD-10-CM | POA: Diagnosis not present

## 2024-02-29 DIAGNOSIS — K5521 Angiodysplasia of colon with hemorrhage: Secondary | ICD-10-CM

## 2024-02-29 DIAGNOSIS — D500 Iron deficiency anemia secondary to blood loss (chronic): Secondary | ICD-10-CM

## 2024-02-29 DIAGNOSIS — K5520 Angiodysplasia of colon without hemorrhage: Secondary | ICD-10-CM

## 2024-02-29 LAB — TYPE AND SCREEN
ABO/RH(D): O POS
Antibody Screen: NEGATIVE
Unit division: 0
Unit division: 0

## 2024-02-29 LAB — BPAM RBC
Blood Product Expiration Date: 202512072359
Blood Product Expiration Date: 202512102359
ISSUE DATE / TIME: 202511111702
ISSUE DATE / TIME: 202511112046
Unit Type and Rh: 5100
Unit Type and Rh: 5100

## 2024-02-29 LAB — GLUCOSE, CAPILLARY
Glucose-Capillary: 114 mg/dL — ABNORMAL HIGH (ref 70–99)
Glucose-Capillary: 125 mg/dL — ABNORMAL HIGH (ref 70–99)
Glucose-Capillary: 238 mg/dL — ABNORMAL HIGH (ref 70–99)
Glucose-Capillary: 393 mg/dL — ABNORMAL HIGH (ref 70–99)
Glucose-Capillary: 65 mg/dL — ABNORMAL LOW (ref 70–99)
Glucose-Capillary: 84 mg/dL (ref 70–99)

## 2024-02-29 LAB — HEMOGLOBIN AND HEMATOCRIT, BLOOD
HCT: 29.9 % — ABNORMAL LOW (ref 39.0–52.0)
HCT: 31.7 % — ABNORMAL LOW (ref 39.0–52.0)
Hemoglobin: 10.3 g/dL — ABNORMAL LOW (ref 13.0–17.0)
Hemoglobin: 10.7 g/dL — ABNORMAL LOW (ref 13.0–17.0)

## 2024-02-29 SURGERY — IMAGING PROCEDURE, GI TRACT, INTRALUMINAL, VIA CAPSULE
Anesthesia: LOCAL

## 2024-02-29 SURGICAL SUPPLY — 1 items: TOWEL COTTON PACK 4EA (MISCELLANEOUS) ×2 IMPLANT

## 2024-02-29 NOTE — TOC Initial Note (Signed)
 Transition of Care (TOC) - Initial/Assessment Note    Patient Details  Name: Gregory Nunez. MRN: [id number] Date of Birth: 1946-03-29  Transition of Care [facility]) CM/SW Contact:    Lauraine FORBES Saa, LCSWA Phone Number: 02/29/2024, 10:16 AM  Clinical Narrative:                  10:16 AM Per chart review, patient is from [facility]. SNF confirmed patient is LTC at [facility]. Patient has insurance and a PCP. Patient has HH history with [facility] and [facility]. Patient has DME (RW, BSC, shower stool/chair) history with [facility] and [facility]. Patient's preferred pharmacy's are [facility], [facility] [address] and [facility]. CSW to send patient's FL2 to SNF upon PT/OT evaluations. CSW requested PT/OT orders to be placed by medical team. CSW will continue to follow.  Expected Discharge Plan: Long Term Nursing Home Barriers to Discharge: Continued Medical Work up   Patient Goals and CMS Choice            Expected Discharge Plan and Services In-house Referral: Clinical Social Work   Post Acute Care Choice: Skilled Nursing Facility Living arrangements for the past 2 months: Skilled Nursing Facility                                      Prior Living Arrangements/Services Living arrangements for the past 2 months: Skilled Nursing Facility Lives with:: Facility Resident Patient language and need for interpreter reviewed:: Yes        Need for Family Participation in Patient Care: Yes (Comment) Care giver support system in place?: Yes (comment) Current home services: DME Criminal Activity/Legal Involvement Pertinent to Current Situation/Hospitalization: No - Comment as needed  Activities of Daily Living      Permission Sought/Granted Permission sought to share information with : Facility Medical Sales Representative, Family Supports Permission granted to share information with : No (Contact information on chart)  Share Information with  NAME: Shakir Petrosino  Permission granted to share info w AGENCY: [facility] LTC  Permission granted to share info w Relationship: Nephew  Permission granted to share info w Contact Information: [phone]  Emotional Assessment   Attitude/Demeanor/Rapport: Unable to Assess Affect (typically observed): Unable to Assess   Alcohol / Substance Use: Not Applicable Psych Involvement: No (comment)  Admission diagnosis:  Iron  deficiency anemia due to chronic blood loss [D50.0] Acute anemia [D64.9] Patient Active Problem List   Diagnosis Date Noted   Acute on chronic anemia 02/28/2024   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) 02/28/2024   CKD (chronic kidney disease), stage V (HCC) 02/28/2024   Acute anemia 01/05/2024   GI bleeding 11/23/2023   Blood loss anemia 10/18/2023   Chronic health problem 10/15/2023   Protein-calorie malnutrition 10/14/2023   Thiamine  deficiency 10/14/2023   Antiplatelet or antithrombotic long-term use 10/14/2023   Upper GI bleeding 10/14/2023   Recent cerebrovascular accident (CVA) 10/14/2023   Gastritis and gastroduodenitis 10/03/2023   Duodenal erosion 10/03/2023   AVM (arteriovenous malformation) of small bowel, 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   GI bleed 10/01/2023   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   Cerebrovascular accident (CVA) (HCC) 09/22/2023   Encephalopathy 09/22/2023   Aphasia 09/22/2023   Neutropenia 09/22/2023   Stage 4 chronic kidney disease (HCC) 09/22/2023   Essential hypertension 09/22/2023   Confusion with non-focal neuro exam  08/17/2023   Acute on chronic blood loss anemia 06/13/2023   Chronic active hepatitis C (HCC) 11/03/2022   History of epistaxis 12/04/2021   Debility 04/15/2021   History of alcohol abuse 04/13/2021   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/12/2021   Weakness of both legs    Adenomatous duodenal polyp    AVM (arteriovenous  malformation) of duodenum, acquired with hemorrhage    Angiodysplasia of intestine    Iron  deficiency anemia 05/22/2020   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 (HCC)    History of CVA (cerebrovascular accident) 04/23/2020   Hyperlipidemia associated with type 2 diabetes mellitus (HCC) 04/23/2020   CKD (chronic kidney disease) stage 4, GFR 15-29 ml/min (HCC) 04/23/2020   Tobacco use 04/23/2020   Seizure disorder (HCC) 11/08/2018   Hypertension associated with diabetes (HCC) 11/08/2018   Controlled type 2 diabetes mellitus without complication, without long-term current use of insulin  (HCC) 11/08/2018   Prostate cancer (HCC) 02/27/2014   PCP:  Charmayne Holmes, DO Pharmacy:   [id number] [name], [place] - [address] [place] [address] Phone: [phone] Fax: [phone]  [facility] [facility] [place], [place] - [address] [place] [address] [place] [address] Phone: [phone] Fax: [phone]  [facility] [address] [place] [address] Phone: [phone] Fax: [phone]     Social Drivers of Health (SDOH) Social History: SDOH Screenings   Food Insecurity: Patient Unable To Answer (01/05/2024)  Housing: Low Risk  (01/05/2024)  Transportation Needs: No Transportation Needs (01/05/2024)  Utilities: Not At Risk (01/05/2024)  Alcohol Screen: Low Risk  (09/21/2023)  Depression (PHQ2-9): Low Risk  (11/03/2022)  Financial Resource Strain: Low Risk  (09/21/2023)  Physical Activity: Insufficiently Active (09/21/2023)  Social Connections: Patient Unable To Answer (01/05/2024)  Stress: No Stress Concern Present (09/21/2023)  Tobacco Use: Medium Risk (02/27/2024)  Health Literacy: Adequate Health Literacy (11/03/2022)   SDOH Interventions:     Readmission Risk Interventions    10/17/2023    4:48 PM 06/20/2023    2:55 PM 06/15/2023    3:00 PM  Readmission Risk Prevention Plan  Post  Dischage Appt   Complete  Medication Screening   Complete  Transportation Screening Complete Complete Complete  PCP or Specialist Appt within 5-7 Days  Complete   Home Care Screening  Complete   Medication Review (RN CM)  Complete

## 2024-02-29 NOTE — Assessment & Plan Note (Signed)
 Per Tedi primary nurse for patient at long term care facility, patient has received 6 weeks of Mevyret between 08-12/2023 and none since due to hospital visits.  - Pharmacy consulted, appreciate recs  - Could be cleared with 6 week tx. Check HCV RNA first, if positive, refer him to [facility] for retreatment (longer duration, and they will likely want to start him on alternate therapy)

## 2024-02-29 NOTE — Assessment & Plan Note (Addendum)
 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

## 2024-02-29 NOTE — Progress Notes (Signed)
 Assisted endoscopy RN with placing belt for pill endoscopy test on patient this a.m. and called pt's nephew Ozell who said a family member was on the way to sit with patient to assist with helping him not remove the belt. No family member came today and so frequently rounded on patient to make sure belt was secure, but pt continually removed the belt and his gown and external catheter. Belt was maintained close to the patient despite it being removed and the monitor still blinking blue at this time. Report given to oncoming RN and will continue to monitor.

## 2024-02-29 NOTE — Progress Notes (Signed)
 PT Cancellation Note  Patient Details Name: Gregory Nunez. MRN: [id number] DOB: 1945/10/28   Cancelled Treatment:    Reason Eval/Treat Not Completed: Other (comment) Pt is undergoing capsule study and has been frequently attempting to remove belt. PT will hold until capsule study is complete.   Bernardino JINNY Ruth 02/29/2024, 4:09 PM

## 2024-02-29 NOTE — Progress Notes (Signed)
 [facility] capsule endoscopy ordered by MD Armbruster.  Patient ingested capsule at 0910.  Per Given's capsule instructions, patient to remain NPO until 1110 at which time they may progress to clear liquid diet. At 1310 patient may have a small snack such as a half a sandwich or a bowl of soup. At 1710 patient may progress to previously ordered diet.  The capsule endoscopy study will conclude at 2110 at which time the recorder and leads or belt can be removed and placed in a patient belongings bag. Endoscopy staff will pick up the equipment in the AM.

## 2024-02-29 NOTE — Progress Notes (Signed)
      Progress Note   Subjective  Capsule placed this AM. He was supposed to be wearing a belt with it attached however he removed it. Monitor is close to his waist and still blinking, so capsule remains active. No family in room   Objective   Vital signs in last 24 hours: Temp:  [97.7 F (36.5 C)-98.7 F (37.1 C)] 97.7 F (36.5 C) (11/12 0918) Pulse Rate:  [79-92] 90 (11/12 0918) Resp:  [17-18] 18 (11/12 0918) BP: (132-160)/(73-89) 160/85 (11/12 0918) SpO2:  [100 %] 100 % (11/12 0918) Weight:  [67.3 kg] 67.3 kg (11/12 0918)   General:     AA male in NAD Neurologic:  dementia - not able to reliably answer questions appropriately Psych:  Cooperative. Normal mood and affect.  Intake/Output from previous day: 11/11 0701 - 11/12 0700 In: 665 [Blood:665] Out: -  Intake/Output this shift: No intake/output data recorded.  Lab Results: Recent Labs    02/27/24 1510 02/28/24 1040 02/29/24 0039  WBC 4.2 4.3  --   HGB 7.4 Repeated and verified X2.* 7.0* 10.3*  HCT 21.6 Repeated and verified X2.* 21.9* 29.9*  PLT 145.0* 142*  --    BMET Recent Labs    02/27/24 1510 02/28/24 1040  NA 138 139  K 4.3 4.2  CL 107 109  CO2 21 19*  GLUCOSE 261* 249*  BUN 80* 79*  CREATININE 4.33* 4.33*  CALCIUM  9.1 8.9   LFT Recent Labs    02/27/24 1510  PROT 8.0  ALBUMIN 3.7  AST 19  ALT 14  ALKPHOS 76  BILITOT 0.3   PT/INR No results for input(s): LABPROT, INR in the last 72 hours.  Studies/Results: No results found.     Assessment / Plan:    78 y/o male here with the following:  Acute on chronic anemia Small bowel AVMs  See yesterday's note for details. Admitted with worsening anemia. Had extensive discussion with family - options were to repeat enteroscopy, try another capsule study, or just transfuse and monitor closely as outpatient. They elected to do a capsule study which I think is the best next step. He swallowed it this AM.  The last time we tried the  capsule he removed the device and it ended the study prematurely. There was supposed to be family in the room to make sure that did not happen today and he is by himself. He has already removed the belt that was placed to secure it, however the monitor is near his waist and blinking blue so it is still active. Spoke with nursing staff, they will try to keep this on his waist as best as he will allow to do the study.  It should be downloaded tomorrow for a result tomorrow. Further recommendations pending the results.  Otherwise Hgb rose higher than expected post transfusion, continue to monitor. No overt bleeding.  Marcey Naval, MD [facility]

## 2024-02-29 NOTE — Assessment & Plan Note (Addendum)
 Hgb 10.3 post 2 PRBC transfusion.  - GI consulted, appreciate recs  - Capsule endoscopy 11/12 - NPO except sips with meds, restart diet after capsule endoscopy completed today - AM CBC, BMP, q12h H/H - Pain management: Tylenol  650 mg every 6 hours as needed for pain - Continue home Protonix  40 BID  - Home Octreotide  injection every 28 days, last given 10/21 - Delerium precautions

## 2024-02-29 NOTE — Assessment & Plan Note (Signed)
 Creatinine of 4.33 on arrival which appears around patient baseline. - Continue to monitor, AM BMP - Encourage p.o. hydration when back on diet

## 2024-02-29 NOTE — Progress Notes (Signed)
 Daily Progress Note Intern Pager: [phone]  Patient name: Gregory Nunez. Medical record number: [id number] Date of birth: 1945-06-19 Age: 78 y.o. 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 is a 78 y.o. male with hx of chronic GI bleed, CVA, dementia, HTN presenting from long term care facility sent by outpatient GI for acute anemia, Hgb 7.4. Differential for presentation of this includes: Assessment & Plan Acute on chronic anemia AVM (arteriovenous malformation) of small bowel, acquired with hemorrhage Hgb 10.3 post 2 PRBC transfusion.  - GI consulted, appreciate recs  - Capsule endoscopy 11/12 - NPO except sips with meds, restart diet after capsule endoscopy completed today - AM CBC, BMP, q12h H/H - Pain management: Tylenol  650 mg every 6 hours as needed for pain - Continue home Protonix  40 BID  - Home Octreotide  injection every 28 days, last given 10/21 - Delerium precautions Controlled type 2 diabetes mellitus without complication, without long-term current use of insulin  (HCC) Well-controlled, currently not on medication. Last A1c 5.1 in 09/2023.  - Sensitive sliding scale insulin  during admission - CBG qACHS Chronic hepatitis C (HCC) Per Gregory Nunez primary nurse for patient at long term care facility, patient has received 6 weeks of Mevyret between 08-12/2023 and none since due to hospital visits.  - Pharmacy consulted, appreciate recs  - Could be cleared with 6 week tx. Check HCV RNA first, if positive, refer him to [facility] for retreatment (longer duration, and they will likely want to start him on alternate therapy) CKD (chronic kidney disease), stage V (HCC) Creatinine of 4.33 on arrival which appears around patient baseline. -  Continue to monitor, AM BMP - Encourage p.o. hydration when back on diet Chronic health problem HTN: Continue home amlodipine  10 mg daily, continue home hydralazine  25 mg every 8 hours CVA: Continue home aspirin  81 mg daily HLD: Continue home atorvastatin  80 mg daily GERD: Continue home pantoprazole  40 mg twice daily B1 deficiency: Continue home thiamine  tablet 100 mg daily  FEN/GI: NPO, sips with meds PPx: SCDs Dispo:Home pending clinical improvement .   Subjective:  Patient was seen and evaluated at bedside. There were no overnight events. Patient states he is doing well and has no complaints. VS stable overnight.  Objective: Temp:  [97.7 F (36.5 C)-98.7 F (37.1 C)] 98.3 F (36.8 C) (11/12 0452) Pulse Rate:  [79-92] 92 (11/12 0452) Resp:  [17-18] 18 (11/12 0452) BP: (132-156)/(73-89) 150/73 (11/12 0452) SpO2:  [100 %] 100 % (11/12 0452) Weight:  [67.3 kg-70.8 kg] 67.3 kg (11/11 1600) Physical Exam: General: resting comfortably lying supine in hospital bed in no acute distress Cardiovascular: distant heart sounds, no m/r/g, 2+ radial pulses Respiratory: CTAB, normal WOB on RA, no w/r/r Abdomen: soft, non-tender, non-distended, diminished BS Extremities: no peripheral edema, moves all extremities equally  Laboratory: Most recent CBC Lab Results  Component Value Date   WBC 4.3 02/28/2024   HGB 10.3 (L) 02/29/2024   HCT 29.9 (L) 02/29/2024   MCV 95.6 02/28/2024   PLT 142 (L) 02/28/2024   Most recent BMP    Latest Ref Rng & Units 02/28/2024   10:40 AM  BMP  Glucose 70 - 99 mg/dL 750   BUN 8 - 23 mg/dL 79   Creatinine 9.38 - 1.24  mg/dL 5.66   Sodium 864 - 854 mmol/L 139   Potassium 3.5 - 5.1 mmol/L 4.2   Chloride 98 - 111 mmol/L 109   CO2 22 - 32 mmol/L 19   Calcium  8.9 - 10.3 mg/dL 8.9     Lupie Credit, DO 02/29/2024, 7:38 AM  PGY-1,  Family Medicine FPTS Intern pager: [phone], text pages welcome Secure chat group [facility]  Teaching Service

## 2024-02-29 NOTE — Assessment & Plan Note (Addendum)
 Well-controlled, currently not on medication. Last A1c 5.1 in 09/2023.  -Sensitive sliding scale insulin  during admission -CBG qACHS

## 2024-03-01 DIAGNOSIS — D649 Anemia, unspecified: Secondary | ICD-10-CM | POA: Diagnosis not present

## 2024-03-01 DIAGNOSIS — K5521 Angiodysplasia of colon with hemorrhage: Secondary | ICD-10-CM | POA: Diagnosis not present

## 2024-03-01 LAB — CBC
HCT: 30.9 % — ABNORMAL LOW (ref 39.0–52.0)
Hemoglobin: 10.7 g/dL — ABNORMAL LOW (ref 13.0–17.0)
MCH: 30.9 pg (ref 26.0–34.0)
MCHC: 34.6 g/dL (ref 30.0–36.0)
MCV: 89.3 fL (ref 80.0–100.0)
Platelets: 136 K/uL — ABNORMAL LOW (ref 150–400)
RBC: 3.46 MIL/uL — ABNORMAL LOW (ref 4.22–5.81)
RDW: 14.6 % (ref 11.5–15.5)
WBC: 5.7 K/uL (ref 4.0–10.5)
nRBC: 0 % (ref 0.0–0.2)

## 2024-03-01 LAB — BASIC METABOLIC PANEL WITH GFR
Anion gap: 11 (ref 5–15)
BUN: 66 mg/dL — ABNORMAL HIGH (ref 8–23)
CO2: 21 mmol/L — ABNORMAL LOW (ref 22–32)
Calcium: 8.8 mg/dL — ABNORMAL LOW (ref 8.9–10.3)
Chloride: 104 mmol/L (ref 98–111)
Creatinine, Ser: 3.76 mg/dL — ABNORMAL HIGH (ref 0.61–1.24)
GFR, Estimated: 16 mL/min — ABNORMAL LOW (ref >60)
Glucose, Bld: 105 mg/dL — ABNORMAL HIGH (ref 70–99)
Potassium: 3.7 mmol/L (ref 3.5–5.1)
Sodium: 136 mmol/L (ref 135–145)

## 2024-03-01 LAB — GLUCOSE, CAPILLARY
Glucose-Capillary: 101 mg/dL — ABNORMAL HIGH (ref 70–99)
Glucose-Capillary: 120 mg/dL — ABNORMAL HIGH (ref 70–99)
Glucose-Capillary: 133 mg/dL — ABNORMAL HIGH (ref 70–99)
Glucose-Capillary: 266 mg/dL — ABNORMAL HIGH (ref 70–99)
Glucose-Capillary: 339 mg/dL — ABNORMAL HIGH (ref 70–99)
Glucose-Capillary: 85 mg/dL (ref 70–99)

## 2024-03-01 LAB — HCV RNA QUANT: HCV Quantitative: NOT DETECTED [IU]/mL (ref >50)

## 2024-03-01 MED ORDER — ENSURE PLUS HIGH PROTEIN PO LIQD
237.0000 mL | Freq: Two times a day (BID) | ORAL | Status: DC
  Administered 2024-03-03 (×2): 237 mL via ORAL

## 2024-03-01 MED ORDER — ENSURE PLUS HIGH PROTEIN PO LIQD: 237.0000 mL | Freq: Two times a day (BID) | ORAL | Status: DC

## 2024-03-01 NOTE — Evaluation (Signed)
 Occupational Therapy Evaluation Patient Details Name: Gregory Nunez. MRN: [id number] DOB: [birth year] Today's Date: 03/01/2024   History of Present Illness   78 y.o. male presents to [facility] hospital on 02/27/2024 from [facility] LTC at SNF with anemia. Capsule endoscopy initiating on 11/12. PMH includes chronic GIB, HTN, dementia, CVA.     Clinical Impressions Pt reported they were using a wc at PLOF and would just get up to [facility] but poor historian. Pt need max encouragement to complete bed mobility with min-mod assist and min x1 person assist to step pivot to chair. Pt was unaware of BM and needed total assist for peri care while standing. He then was set up with breakfast at the end session. Recommended to return to LTC with [facility] therapies and mobility team to follow. Acute Occupational Therapy signing off but re consult if needed.      If plan is discharge home, recommend the following:   A little help with walking and/or transfers;A lot of help with bathing/dressing/bathroom;Assistance with cooking/housework;Assist for transportation;Help with stairs or ramp for entrance;Supervision due to cognitive status;Direct supervision/assist for financial management;Direct supervision/assist for medications management;Assistance with feeding     Functional Status Assessment   Patient has had a recent decline in their functional status and demonstrates the ability to make significant improvements in function in a reasonable and predictable amount of time.     Equipment Recommendations   None recommended by OT     Recommendations for Other Services         Precautions/Restrictions   Precautions Precautions: Fall Recall of Precautions/Restrictions: Intact Restrictions Weight Bearing Restrictions Per Provider Order: No     Mobility Bed Mobility Overal bed mobility: Needs Assistance Bed Mobility: Supine to Sit     Supine to sit: Min assist     General bed  mobility comments: Increased time and effort throughout. Able to slightly assist with shifting hips forward with bed pad before pt refused further help    Transfers Overall transfer level: Needs assistance Equipment used: 1 person hand held assist Transfers: Sit to/from Stand, Bed to chair/wheelchair/BSC Sit to Stand: Min assist     Step pivot transfers: Min assist     General transfer comment: MinA via 1HH for steadying assist      Balance Overall balance assessment: Needs assistance Sitting-balance support: Feet supported, No upper extremity supported Sitting balance-Leahy Scale: Fair     Standing balance support: Single extremity supported, During functional activity, Reliant on assistive device for balance Standing balance-Leahy Scale: Poor Standing balance comment: reliant on 1UE support                           ADL either performed or assessed with clinical judgement   ADL Overall ADL's : At baseline (per chart appears to be at baseline needs constant supervision and assist with all ADLs)                                             Vision         Perception         Praxis         Pertinent Vitals/Pain Pain Assessment Pain Assessment: No/denies pain     Extremity/Trunk Assessment Upper Extremity Assessment Upper Extremity Assessment: Overall WFL for tasks assessed   Lower Extremity Assessment  Lower Extremity Assessment: Defer to PT evaluation   Cervical / Trunk Assessment Cervical / Trunk Assessment: Kyphotic   Communication Communication Communication: Impaired Factors Affecting Communication: Difficulty expressing self;Reduced clarity of speech   Cognition Arousal: Alert Behavior During Therapy: Restless, Agitated Cognition: History of cognitive impairments                               Following commands: Impaired Following commands impaired: Follows one step commands with increased time, Follows  one step commands inconsistently     Cueing  General Comments   Cueing Techniques: Verbal cues;Tactile cues      Exercises     Shoulder Instructions      Home Living Family/patient expects to be discharged to:: Skilled nursing facility (LTC) Living Arrangements: Alone Available Help at Discharge: Family;Available PRN/intermittently                             Additional Comments: No family present today. information taken from previous chart. Pt from [facility].      Prior Functioning/Environment Prior Level of Function : Patient poor historian/Family not available;History of Falls (last six months)             Mobility Comments: Reported performing step-pivots to WC. Uses WC for mobility ADLs Comments: Reported staff assists with bathing    OT Problem List: Decreased strength;Decreased activity tolerance;Impaired balance (sitting and/or standing);Decreased safety awareness;Decreased knowledge of use of DME or AE   OT Treatment/Interventions:        OT Goals(Current goals can be found in the care plan section)   Acute Rehab OT Goals Patient Stated Goal: none OT Goal Formulation: With patient Time For Goal Achievement: 03/15/24 Potential to Achieve Goals: Fair   OT Frequency:       Co-evaluation PT/OT/SLP Co-Evaluation/Treatment: Yes Reason for Co-Treatment: Complexity of the patient's impairments (multi-system involvement);For patient/therapist safety PT goals addressed during session: Mobility/safety with mobility;Balance OT goals addressed during session: ADL's and self-care      AM-PAC OT 6 Clicks Daily Activity     Outcome Measure Help from another person eating meals?: A Little Help from another person taking care of personal grooming?: A Little Help from another person toileting, which includes using toliet, bedpan, or urinal?: A Lot Help from another person bathing (including washing, rinsing, drying)?: A Lot Help  from another person to put on and taking off regular upper body clothing?: A Little Help from another person to put on and taking off regular lower body clothing?: A Lot 6 Click Score: 15   End of Session Equipment Utilized During Treatment: Gait belt Nurse Communication: Mobility status  Activity Tolerance: Treatment limited secondary to agitation Patient left: in chair;with call bell/phone within reach;with chair alarm set  OT Visit Diagnosis: Unsteadiness on feet (R26.81);Other abnormalities of gait and mobility (R26.89);Repeated falls (R29.6);Muscle weakness (generalized) (M62.81)                Time: 9073-9049 OT Time Calculation (min): 24 min Charges:  OT General Charges $OT Visit: 1 Visit OT Evaluation $OT Eval Low Complexity: 1 Low  Warrick POUR OTR/L  Acute Rehab Services  [phone] office number   Warrick Berber 03/01/2024, 10:19 AM

## 2024-03-01 NOTE — Progress Notes (Signed)
 [name] (niece) called for update. Update given. Gregory Nunez

## 2024-03-01 NOTE — Assessment & Plan Note (Addendum)
 Hgb 10.3 post 2 PRBC transfusion.  - GI consulted, appreciate recs  - Capsule endoscopy 11/12, awaiting further recommendations from capsule endoscopy results - Lab holiday - Pain management: Tylenol  650 mg every 6 hours as needed for pain - Continue home Protonix  40 BID  - Home Octreotide  injection every 28 days, last given 10/21 - Delerium precautions

## 2024-03-01 NOTE — Progress Notes (Signed)
      Progress Note   Subjective  Patient denies complaints. No bleeding symptoms. Hgb stable.   Objective   Vital signs in last 24 hours: Temp:  [97.9 F (36.6 C)-99 F (37.2 C)] 99 F (37.2 C) (11/13 1455) Pulse Rate:  [86-105] 104 (11/13 1455) Resp:  [18-20] 18 (11/13 1455) BP: (138-161)/(74-86) 138/79 (11/13 1455) SpO2:  [100 %] 100 % (11/13 1455)   General:    AA male in NAD Psych:  Cooperative. Normal mood and affect.  Intake/Output from previous day: 11/12 0701 - 11/13 0700 In: -  Out: 800 [Urine:800] Intake/Output this shift: Total I/O In: 480 [P.O.:480] Out: -   Lab Results: Recent Labs    02/28/24 1040 02/29/24 0039 02/29/24 1802 03/01/24 0434  WBC 4.3  --   --  5.7  HGB 7.0* 10.3* 10.7* 10.7*  HCT 21.9* 29.9* 31.7* 30.9*  PLT 142*  --   --  136*   BMET Recent Labs    02/28/24 1040 03/01/24 0434  NA 139 136  K 4.2 3.7  CL 109 104  CO2 19* 21*  GLUCOSE 249* 105*  BUN 79* 66*  CREATININE 4.33* 3.76*  CALCIUM  8.9 8.8*   LFT No results for input(s): PROT, ALBUMIN, AST, ALT, ALKPHOS, BILITOT, BILIDIR, IBILI in the last 72 hours. PT/INR No results for input(s): LABPROT, INR in the last 72 hours.  Studies/Results: No results found.     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  I had a discussion with the family during admission to discuss options, they proceeded with capsule endoscopy.  That was done yesterday and interpreted today.  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 No active bleeding or heme noted anywhere. There do appear to be at least a few of the AVMs within reach of standard enteroscope.  He is  on octreotide  depo injections as an outpatient for this, unfortunately it is not controlling his blood loss.  Difficult situation.  I suspect if we do nothing he will likely require transfusions periodically, perhaps monthly and need close monitoring of his blood counts.  I otherwise offered them another enteroscopy with ablation of as many AVMs as we can find to see if that will make a difference.  I discussed with family, Kervin Bones, how aggressive he wanted us  to be with this.  Discussed risks of enteroscopy and ablation of AVMs to include bleeding and bowel injury etc, and risks of anesthesia.  We can try to do an enteroscopy and see if that will help him although I cannot guarantee it will make any change in his course.  Following discussion of this they do want to try it.  Please make n.p.o. after midnight, hopefully can do enteroscopy tomorrow.  Trend hemoglobin and monitor for rebleeding   Marcey Naval, MD [facility] Gastroenterology

## 2024-03-01 NOTE — Plan of Care (Signed)

## 2024-03-01 NOTE — Assessment & Plan Note (Signed)
 Well-controlled, currently not on medication. Last A1c 5.1 in 09/2023.  - Sensitive sliding scale insulin  during admission - CBG qACHS

## 2024-03-01 NOTE — Progress Notes (Signed)
 Daily Progress Note Intern Pager: [phone]  Patient name: Gregory Nunez. Medical record number: [id number] Date of birth: November 10, 1945 Age: 78 y.o. Gender: male  Primary Care Provider: Charmayne Holmes, DO Consultants: GI Code Status: Full  Pt Overview and Major Events to Date:  11/11: Admitted for acute anemia 2/2 chronic duodenal AVMs 11/12: Capsule endoscopy by GI  Medical Decision Making:  Debby Meribeth Allard Lightsey. is a 78 y.o. male with hx of chronic GI bleed, CVA, dementia, HTN presenting from long term care facility sent by outpatient GI for acute anemia, Hgb 7.4.  Assessment & Plan Acute on chronic anemia AVM (arteriovenous malformation) of small bowel, acquired with hemorrhage Hgb 10.3 post 2 PRBC transfusion.  - GI consulted, appreciate recs  - Capsule endoscopy 11/12, awaiting further recommendations from capsule endoscopy results - Lab holiday - Pain management: Tylenol  650 mg every 6 hours as needed for pain - Continue home Protonix  40 BID  - Home Octreotide  injection every 28 days, last given 10/21 - Delerium precautions Controlled type 2 diabetes mellitus without complication, without long-term current use of insulin  (HCC) Well-controlled, currently not on medication. Last A1c 5.1 in 09/2023.  - Sensitive sliding scale insulin  during admission - CBG qACHS Chronic hepatitis C (HCC) Per Antoinette primary nurse for patient at long term care facility, patient has received 6 weeks of Mevyret between 08-12/2023 and none since due to hospital visits.  - Pharmacy consulted, appreciate recs  - Could be cleared with 6 week tx however this is a shorter duration than recommended. Check HCV RNA first, if positive, refer him to [facility] for retreatment (longer duration, and they will likely want to start him on alternate therapy) -Awaiting HCV RNA results CKD (chronic kidney disease), stage V (HCC) Creatinine at baseline. - Continue to monitor, AM BMP - Encourage  p.o. hydration Chronic health problem HTN: Continue home amlodipine  10 mg daily, continue home hydralazine  25 mg every 8 hours CVA: Continue home aspirin  81 mg daily HLD: Continue home atorvastatin  80 mg daily GERD: Continue home pantoprazole  40 mg twice daily B1 deficiency: Continue home thiamine  tablet 100 mg daily  FEN/GI: Regular diet PPx: SCDs Dispo:Long-term care facility pending GI recs.  Subjective:  Patient was seen and evaluated at bedside.  There were no overnight events.  Objective: Temp:  [97.7 F (36.5 C)-97.9 F (36.6 C)] 97.9 F (36.6 C) (11/13 0530) Pulse Rate:  [86-105] 86 (11/13 0530) Resp:  [18-20] 20 (11/12 2048) BP: (143-161)/(74-85) 143/81 (11/13 0530) SpO2:  [100 %] 100 % (11/13 0530) Weight:  [67.3 kg] 67.3 kg (11/12 0918) Physical Exam: General: Sleeping comfortably in hospital bed, easy to wake, no acute distress Cardiovascular: RRR, no murmurs, 2+ radial pulses Respiratory: CTAB, normal work of breathing on room air, no wheezes/rales/rhonchi Abdomen: Soft, nontender, nondistended, bowel sounds present Extremities: No peripheral edema, moves all extremities equally  Laboratory: Most recent CBC Lab Results  Component Value Date   WBC 5.7 03/01/2024   HGB 10.7 (L) 03/01/2024   HCT 30.9 (L) 03/01/2024   MCV 89.3 03/01/2024   PLT 136 (L) 03/01/2024   Most recent BMP    Latest Ref Rng & Units 03/01/2024    4:34 AM  BMP  Glucose 70 - 99 mg/dL 894   BUN 8 - 23 mg/dL 66   Creatinine 9.38 - 1.24 mg/dL 6.23   Sodium 864 - 854 mmol/L 136   Potassium 3.5 - 5.1 mmol/L 3.7   Chloride 98 - 111 mmol/L  104   CO2 22 - 32 mmol/L 21   Calcium  8.9 - 10.3 mg/dL 8.8    Lupie Credit, DO 03/01/2024, 7:53 AM  PGY-1, [facility] Family Medicine FPTS Intern pager: [phone], text pages welcome Secure chat group [facility] Teaching Service

## 2024-03-01 NOTE — Assessment & Plan Note (Addendum)
 Per Antoinette primary nurse for patient at long term care facility, patient has received 6 weeks of Mevyret between 08-12/2023 and none since due to hospital visits.  - Pharmacy consulted, appreciate recs  - Could be cleared with 6 week tx however this is a shorter duration than recommended. Check HCV RNA first, if positive, refer him to [facility] for retreatment (longer duration, and they will likely want to start him on alternate therapy) -Awaiting HCV RNA results

## 2024-03-01 NOTE — NC FL2 (Signed)
 [place]  MEDICAID FL2 LEVEL OF CARE FORM     IDENTIFICATION  Patient Name: Gregory Nunez. Birthdate: [birth year] Sex: male Admission Date (Current Location): 02/28/2024  [facility] and [place] Number:  Producer, Television/film/video and Address:  [facility], 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) 02/28/2024   CKD (chronic kidney disease), stage V (HCC) 02/28/2024   Acute anemia 01/05/2024   GI bleeding 11/23/2023   Blood loss anemia 10/18/2023   Chronic health problem 10/15/2023   Protein-calorie malnutrition 10/14/2023   Thiamine  deficiency 10/14/2023   Antiplatelet or antithrombotic long-term use 10/14/2023   Upper GI bleeding 10/14/2023   Recent cerebrovascular accident (CVA) 10/14/2023   Gastritis and gastroduodenitis 10/03/2023   Duodenal erosion 10/03/2023   AVM (arteriovenous malformation) of small bowel, 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   GI bleed 10/01/2023   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   Cerebrovascular accident (CVA) (HCC) 09/22/2023   Encephalopathy 09/22/2023   Aphasia 09/22/2023   Neutropenia 09/22/2023   Stage 4 chronic kidney disease (HCC) 09/22/2023   Essential hypertension 09/22/2023   Confusion with non-focal neuro exam 08/17/2023   Acute on chronic blood loss anemia 06/13/2023   Chronic hepatitis C (HCC) 11/03/2022   History of epistaxis 12/04/2021    Debility 04/15/2021   History of alcohol abuse 04/13/2021   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/12/2021   Weakness of both legs    Adenomatous duodenal polyp    AVM (arteriovenous malformation) of duodenum, acquired with hemorrhage    Angiodysplasia of intestine    Iron  deficiency anemia 05/22/2020   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 (HCC)    History of CVA (cerebrovascular accident) 04/23/2020   Hyperlipidemia associated with type 2 diabetes mellitus (HCC) 04/23/2020   CKD (chronic kidney disease) stage 4, GFR 15-29 ml/min (HCC) 04/23/2020   Tobacco use 04/23/2020   Seizure disorder (HCC) 11/08/2018   Hypertension associated with diabetes (HCC) 11/08/2018   Controlled type 2 diabetes mellitus without complication, without long-term current use of insulin  (HCC) 11/08/2018   Prostate cancer (HCC) 02/27/2014    Orientation RESPIRATION BLADDER Height & Weight     Self  Normal (Room Air) Incontinent Weight: 148 lb 5.9 oz (67.3 kg) Height:  5' 9 (175.3 cm)  BEHAVIORAL SYMPTOMS/MOOD NEUROLOGICAL BOWEL NUTRITION STATUS    Convulsions/Seizures (Seizure Disorder) Incontinent Diet (Please see discharge summary)  AMBULATORY STATUS COMMUNICATION OF NEEDS Skin   Limited Assist Verbally Normal                       Personal Care Assistance Level of Assistance  Bathing, Feeding, Dressing Bathing Assistance: Limited assistance Feeding assistance: Limited assistance Dressing Assistance: Limited assistance     Functional Limitations Info             SPECIAL CARE FACTORS FREQUENCY  PT (By licensed PT), OT (By licensed OT)  PT Frequency: 5x OT Frequency: 5x            Contractures Contractures Info: Not present    Additional Factors Info  Allergies, Code Status, Insulin  Sliding Scale Code Status Info: Full Code Allergies Info: Penicillins   Insulin  Sliding Scale Info: Please see discharge summary       Current Medications  (03/01/2024):  This is the current hospital active medication list Current Facility-Administered Medications  Medication Dose Route Frequency Provider Last Rate Last Admin   acetaminophen  (TYLENOL ) tablet 650 mg  650 mg Oral Q6H PRN [name], Camie, DO       Or   acetaminophen  (TYLENOL ) suppository 650 mg  650 mg Rectal Q6H PRN [name], Camie, DO       amLODipine  (NORVASC ) tablet 10 mg  10 mg Oral Daily Diona Perkins, MD   10 mg at 03/01/24 1015   aspirin  EC tablet 81 mg  81 mg Oral Daily Diona Perkins, MD   81 mg at 03/01/24 1015   atorvastatin  (LIPITOR ) tablet 80 mg  80 mg Oral Daily Diona Perkins, MD   80 mg at 03/01/24 1015   hydrALAZINE  (APRESOLINE ) tablet 25 mg  25 mg Oral Q8H Diona Perkins, MD   25 mg at 03/01/24 0535   insulin  aspart (novoLOG ) injection 0-6 Units  0-6 Units Subcutaneous Q4H Diona Perkins, MD   4 Units at 03/01/24 1238   pantoprazole  (PROTONIX ) EC tablet 40 mg  40 mg Oral BID Diona Perkins, MD   40 mg at 03/01/24 1015   thiamine  (VITAMIN B1) tablet 100 mg  100 mg Oral Daily Diona Perkins, MD   100 mg at 03/01/24 1015     Discharge Medications: Please see discharge summary for a list of discharge medications.  Relevant Imaging Results:  Relevant Lab Results:   Additional Information SSN: [id number]  Lauraine FORBES Saa, LCSWA

## 2024-03-01 NOTE — TOC Progression Note (Signed)
 Transition of Care [facility]) - Progression Note    Patient Details  Name: Gregory Nunez. MRN: [id number] Date of Birth: 09-20-1945  Transition of Care [facility]) CM/SW Contact  Lauraine FORBES Saa, LCSWA Phone Number: 03/01/2024, 1:38 PM  Clinical Narrative:     1:38 PM CSW sent patient's FL2 to [facility] SN[facility] LTC. Medical team informed CSW that patient's discharge date is pending GI recommendations. CSW relayed information to SNF. CSW will continue to follow.  Expected Discharge Plan: Long Term Nursing Home Barriers to Discharge: Continued Medical Work up               Expected Discharge Plan and Services In-house Referral: Clinical Social Work   Post Acute Care Choice: Skilled Nursing Facility Living arrangements for the past 2 months: Skilled Nursing Facility                                       Social Drivers of Health (SDOH) Interventions SDOH Screenings   Food Insecurity: Patient Unable To Answer (02/29/2024)  Housing: Low Risk  (02/29/2024)  Transportation Needs: No Transportation Needs (02/29/2024)  Utilities: Not At Risk (02/29/2024)  Alcohol Screen: Low Risk  (09/21/2023)  Depression (PHQ2-9): Low Risk  (11/03/2022)  Financial Resource Strain: Low Risk  (09/21/2023)  Physical Activity: Insufficiently Active (09/21/2023)  Social Connections: Socially Isolated (02/29/2024)  Stress: No Stress Concern Present (09/21/2023)  Tobacco Use: Medium Risk (02/27/2024)  Health Literacy: Adequate Health Literacy (11/03/2022)    Readmission Risk Interventions    10/17/2023    4:48 PM 06/20/2023    2:55 PM 06/15/2023    3:00 PM  Readmission Risk Prevention Plan  Post Dischage Appt   Complete  Medication Screening   Complete  Transportation Screening Complete Complete Complete  PCP or Specialist Appt within 5-7 Days  Complete   Home Care Screening  Complete   Medication Review (RN CM)  Complete

## 2024-03-01 NOTE — Progress Notes (Signed)
 Patient refused blood sugar check.[name],DO aware no new orders. Gregory Nunez

## 2024-03-01 NOTE — Assessment & Plan Note (Addendum)
 Creatinine at baseline. - Continue to monitor, AM BMP - Encourage p.o. hydration

## 2024-03-01 NOTE — Evaluation (Addendum)
 Physical Therapy Evaluation Patient Details Name: Gregory Nunez. MRN: [id number] DOB: Jul 25, 1945 Today's Date: 03/01/2024  History of Present Illness  78 y.o. male presents to [facility] hospital on 02/27/2024 from [facility] LTC at SNF with anemia. Capsule endoscopy initiating on 11/12. PMH includes chronic GIB, HTN, dementia, CVA.  Clinical Impression  Pt admitted from LTC facility where pt reported performing step-pivots to manual WC. Pt reported using a manual WC for all mobility. High levels of encouragement needed for pt to participate with pt agreeing to move to the recliner to eat breakfast. Pt is likely at mobility baseline requiring MinA for bed mobility and MinA to step-pivot via 1HH. Deferring post-acute PT needs to LTC facility. Recommending pt continue to mobilize with mobility specialist while in the acute setting. Pt has no further acute PT needs with acute PT signing off. Please re-consult if new needs arise.         If plan is discharge home, recommend the following: A lot of help with walking and/or transfers;A lot of help with bathing/dressing/bathroom;Assist for transportation;Help with stairs or ramp for entrance;Direct supervision/assist for financial management;Direct supervision/assist for medications management;Assistance with cooking/housework;Assistance with feeding   Can travel by private vehicle   NoYes    Equipment Recommendations None recommended by PT     Functional Status Assessment Patient has had a recent decline in their functional status and demonstrates the ability to make significant improvements in function in a reasonable and predictable amount of time.     Precautions / Restrictions Precautions Precautions: Fall Recall of Precautions/Restrictions: Intact Restrictions Weight Bearing Restrictions Per Provider Order: No      Mobility  Bed Mobility Overal bed mobility: Needs Assistance Bed Mobility: Supine to Sit     Supine to  sit: Min assist     General bed mobility comments: Increased time and effort throughout. Able to slightly assist with shifting hips forward with bed pad before pt refused further help    Transfers Overall transfer level: Needs assistance Equipment used: 1 person hand held assist Transfers: Sit to/from Stand, Bed to chair/wheelchair/BSC Sit to Stand: Min assist   Step pivot transfers: Min assist      General transfer comment: MinA via 1HH for steadying assist       Balance Overall balance assessment: Needs assistance Sitting-balance support: Feet supported, No upper extremity supported Sitting balance-Leahy Scale: Fair     Standing balance support: Single extremity supported, During functional activity, Reliant on assistive device for balance Standing balance-Leahy Scale: Poor Standing balance comment: reliant on 1UE support       Pertinent Vitals/Pain Pain Assessment Pain Assessment: No/denies pain    Home Living Family/patient expects to be discharged to:: Skilled nursing facility (LTC) Living Arrangements: Alone  Prior Function Prior Level of Function : Patient poor historian/Family not available;History of Falls (last six months)    Mobility Comments: Reported performing step-pivots to WC. Uses WC for mobility ADLs Comments: Reported staff assists with bathing     Extremity/Trunk Assessment   Upper Extremity Assessment Upper Extremity Assessment: Overall WFL for tasks assessed    Lower Extremity Assessment Lower Extremity Assessment: Defer to PT evaluation    Cervical / Trunk Assessment Cervical / Trunk Assessment: Kyphotic  Communication   Communication Communication: Impaired Factors Affecting Communication: Difficulty expressing self;Reduced clarity of speech    Cognition Arousal: Alert Behavior During Therapy: Restless, Agitated   PT - Cognitive impairments: No family/caregiver present to determine baseline      PT -  Cognition Comments:  Intermittently agitated and restless. Able to be re-directed with increased time Following commands: Impaired Following commands impaired: Follows one step commands with increased time, Follows one step commands inconsistently     Cueing Cueing Techniques: Verbal cues, Tactile cues      PT Assessment All further PT needs can be met in the next venue of care  PT Problem List Decreased strength;Decreased activity tolerance;Decreased balance;Decreased mobility;Decreased cognition;Decreased knowledge of use of DME;Decreased safety awareness           PT Goals (Current goals can be found in the Care Plan section)  Acute Rehab PT Goals PT Goal Formulation: All assessment and education complete, DC therapy         Co-evaluation   Reason for Co-Treatment: Complexity of the patient's impairments (multi-system involvement);For patient/therapist safety PT goals addressed during session: Mobility/safety with mobility;Balance OT goals addressed during session: ADL's and self-care       AM-PAC PT 6 Clicks Mobility  Outcome Measure Help needed turning from your back to your side while in a flat bed without using bedrails?: None Help needed moving from lying on your back to sitting on the side of a flat bed without using bedrails?: A Little Help needed moving to and from a bed to a chair (including a wheelchair)?: A Little Help needed standing up from a chair using your arms (e.g., wheelchair or bedside chair)?: A Little Help needed to walk in hospital room?: Total Help needed climbing 3-5 steps with a railing? : Total 6 Click Score: 15    End of Session   Activity Tolerance: Patient tolerated treatment well Patient left: in chair;with call bell/phone within reach;with chair alarm set Nurse Communication: Mobility status PT Visit Diagnosis: Other abnormalities of gait and mobility (R26.89);Muscle weakness (generalized) (M62.81)    Time: 9091-9075 PT Time Calculation (min) (ACUTE  ONLY): 16 min   Charges:   PT Evaluation $PT Eval Low Complexity: 1 Low   PT General Charges $$ ACUTE PT VISIT: 1 Visit       Kate ORN, PT, DPT Secure Chat Preferred  Rehab Office [phone]   Kate BRAVO [name] 03/01/2024, 1:01 PM

## 2024-03-01 NOTE — H&P (View-Only) (Signed)
      Progress Note   Subjective  Patient denies complaints. No bleeding symptoms. Hgb stable.   Objective   Vital signs in last 24 hours: Temp:  [97.9 F (36.6 C)-99 F (37.2 C)] 99 F (37.2 C) (11/13 1455) Pulse Rate:  [86-105] 104 (11/13 1455) Resp:  [18-20] 18 (11/13 1455) BP: (138-161)/(74-86) 138/79 (11/13 1455) SpO2:  [100 %] 100 % (11/13 1455)   General:    AA male in NAD Psych:  Cooperative. Normal mood and affect.  Intake/Output from previous day: 11/12 0701 - 11/13 0700 In: -  Out: 800 [Urine:800] Intake/Output this shift: Total I/O In: 480 [P.O.:480] Out: -   Lab Results: Recent Labs    02/28/24 1040 02/29/24 0039 02/29/24 1802 03/01/24 0434  WBC 4.3  --   --  5.7  HGB 7.0* 10.3* 10.7* 10.7*  HCT 21.9* 29.9* 31.7* 30.9*  PLT 142*  --   --  136*   BMET Recent Labs    02/28/24 1040 03/01/24 0434  NA 139 136  K 4.2 3.7  CL 109 104  CO2 19* 21*  GLUCOSE 249* 105*  BUN 79* 66*  CREATININE 4.33* 3.76*  CALCIUM  8.9 8.8*   LFT No results for input(s): PROT, ALBUMIN, AST, ALT, ALKPHOS, BILITOT, BILIDIR, IBILI in the last 72 hours. PT/INR No results for input(s): LABPROT, INR in the last 72 hours.  Studies/Results: No results found.     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  I had a discussion with the family during admission to discuss options, they proceeded with capsule endoscopy.  That was done yesterday and interpreted today.  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 No active bleeding or heme noted anywhere. There do appear to be at least a few of the AVMs within reach of standard enteroscope.  He is  on octreotide  depo injections as an outpatient for this, unfortunately it is not controlling his blood loss.  Difficult situation.  I suspect if we do nothing he will likely require transfusions periodically, perhaps monthly and need close monitoring of his blood counts.  I otherwise offered them another enteroscopy with ablation of as many AVMs as we can find to see if that will make a difference.  I discussed with family, Kervin Bones, how aggressive he wanted us  to be with this.  Discussed risks of enteroscopy and ablation of AVMs to include bleeding and bowel injury etc, and risks of anesthesia.  We can try to do an enteroscopy and see if that will help him although I cannot guarantee it will make any change in his course.  Following discussion of this they do want to try it.  Please make n.p.o. after midnight, hopefully can do enteroscopy tomorrow.  Trend hemoglobin and monitor for rebleeding   Marcey Naval, MD [facility]

## 2024-03-01 NOTE — Assessment & Plan Note (Signed)
 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

## 2024-03-02 ENCOUNTER — Encounter (HOSPITAL_COMMUNITY)
Admission: EM | Disposition: A | Discharge status: Skilled Nursing Facility | Payer: Self-pay | Source: Skilled Nursing Facility | Attending: Family Medicine

## 2024-03-02 ENCOUNTER — Inpatient Hospital Stay (HOSPITAL_COMMUNITY): Admitting: Anesthesiology

## 2024-03-02 ENCOUNTER — Telehealth: Payer: Self-pay

## 2024-03-02 ENCOUNTER — Encounter (HOSPITAL_COMMUNITY): Payer: Self-pay

## 2024-03-02 DIAGNOSIS — K449 Diaphragmatic hernia without obstruction or gangrene: Secondary | ICD-10-CM

## 2024-03-02 DIAGNOSIS — K31819 Angiodysplasia of stomach and duodenum without bleeding: Secondary | ICD-10-CM | POA: Diagnosis not present

## 2024-03-02 DIAGNOSIS — E1122 Type 2 diabetes mellitus with diabetic chronic kidney disease: Secondary | ICD-10-CM | POA: Diagnosis not present

## 2024-03-02 DIAGNOSIS — D649 Anemia, unspecified: Secondary | ICD-10-CM | POA: Diagnosis not present

## 2024-03-02 DIAGNOSIS — D631 Anemia in chronic kidney disease: Secondary | ICD-10-CM

## 2024-03-02 DIAGNOSIS — I129 Hypertensive chronic kidney disease with stage 1 through stage 4 chronic kidney disease, or unspecified chronic kidney disease: Secondary | ICD-10-CM

## 2024-03-02 DIAGNOSIS — K31811 Angiodysplasia of stomach and duodenum with bleeding: Secondary | ICD-10-CM

## 2024-03-02 DIAGNOSIS — D500 Iron deficiency anemia secondary to blood loss (chronic): Secondary | ICD-10-CM

## 2024-03-02 DIAGNOSIS — K3189 Other diseases of stomach and duodenum: Secondary | ICD-10-CM

## 2024-03-02 DIAGNOSIS — N184 Chronic kidney disease, stage 4 (severe): Secondary | ICD-10-CM

## 2024-03-02 DIAGNOSIS — K5521 Angiodysplasia of colon with hemorrhage: Secondary | ICD-10-CM | POA: Diagnosis not present

## 2024-03-02 DIAGNOSIS — Z87891 Personal history of nicotine dependence: Secondary | ICD-10-CM

## 2024-03-02 DIAGNOSIS — K5520 Angiodysplasia of colon without hemorrhage: Secondary | ICD-10-CM

## 2024-03-02 LAB — CBC WITH DIFFERENTIAL/PLATELET
Abs Immature Granulocytes: 0.01 K/uL (ref 0.00–0.07)
Basophils Absolute: 0 K/uL (ref 0.0–0.1)
Basophils Relative: 0 %
Eosinophils Absolute: 0.1 K/uL (ref 0.0–0.5)
Eosinophils Relative: 3 %
HCT: 31.4 % — ABNORMAL LOW (ref 39.0–52.0)
Hemoglobin: 10.3 g/dL — ABNORMAL LOW (ref 13.0–17.0)
Immature Granulocytes: 0 %
Lymphocytes Relative: 23 %
Lymphs Abs: 1.3 K/uL (ref 0.7–4.0)
MCH: 30.8 pg (ref 26.0–34.0)
MCHC: 32.8 g/dL (ref 30.0–36.0)
MCV: 94 fL (ref 80.0–100.0)
Monocytes Absolute: 0.6 K/uL (ref 0.1–1.0)
Monocytes Relative: 10 %
Neutro Abs: 3.5 K/uL (ref 1.7–7.7)
Neutrophils Relative %: 64 %
Platelets: 138 K/uL — ABNORMAL LOW (ref 150–400)
RBC: 3.34 MIL/uL — ABNORMAL LOW (ref 4.22–5.81)
RDW: 14.4 % (ref 11.5–15.5)
WBC: 5.5 K/uL (ref 4.0–10.5)
nRBC: 0 % (ref 0.0–0.2)

## 2024-03-02 LAB — GLUCOSE, CAPILLARY
Glucose-Capillary: 163 mg/dL — ABNORMAL HIGH (ref 70–99)
Glucose-Capillary: 114 mg/dL — ABNORMAL HIGH (ref 70–99)
Glucose-Capillary: 108 mg/dL — ABNORMAL HIGH (ref 70–99)
Glucose-Capillary: 117 mg/dL — ABNORMAL HIGH (ref 70–99)
Glucose-Capillary: 145 mg/dL — ABNORMAL HIGH (ref 70–99)
Glucose-Capillary: 224 mg/dL — ABNORMAL HIGH (ref 70–99)

## 2024-03-02 LAB — BASIC METABOLIC PANEL WITH GFR
Anion gap: 15 (ref 5–15)
BUN: 72 mg/dL — ABNORMAL HIGH (ref 8–23)
CO2: 18 mmol/L — ABNORMAL LOW (ref 22–32)
Calcium: 8.5 mg/dL — ABNORMAL LOW (ref 8.9–10.3)
Chloride: 106 mmol/L (ref 98–111)
Creatinine, Ser: 4.3 mg/dL — ABNORMAL HIGH (ref 0.61–1.24)
GFR, Estimated: 14 mL/min — ABNORMAL LOW (ref >60)
Glucose, Bld: 108 mg/dL — ABNORMAL HIGH (ref 70–99)
Potassium: 4.1 mmol/L (ref 3.5–5.1)
Sodium: 139 mmol/L (ref 135–145)

## 2024-03-02 SURGERY — ENTEROSCOPY
Anesthesia: Monitor Anesthesia Care

## 2024-03-02 MED ORDER — PHENYLEPHRINE HCL (PRESSORS) 10 MG/ML IV SOLN
INTRAVENOUS | Status: DC | PRN
  Administered 2024-03-02: 80 ug via INTRAVENOUS

## 2024-03-02 MED ORDER — SODIUM CHLORIDE 0.9 % IV SOLN
INTRAVENOUS | Status: AC | PRN
Start: 2024-03-02 — End: 2024-03-02
  Administered 2024-03-02: 500 mL via INTRAVENOUS

## 2024-03-02 MED ORDER — INSULIN ASPART 100 UNIT/ML IJ SOLN
0.0000 [IU] | Freq: Three times a day (TID) | INTRAMUSCULAR | Status: DC
  Administered 2024-03-03: 1 [IU] via SUBCUTANEOUS
  Filled 2024-03-02: qty 1

## 2024-03-02 MED ORDER — PHENYLEPHRINE HCL-NACL 20-0.9 MG/250ML-% IV SOLN
INTRAVENOUS | Status: DC | PRN
  Administered 2024-03-02: 50 ug/min via INTRAVENOUS

## 2024-03-02 MED ORDER — PROPOFOL 500 MG/50ML IV EMUL
INTRAVENOUS | Status: DC | PRN
Start: 2024-03-02 — End: 2024-03-02
  Administered 2024-03-02: 100 ug/kg/min via INTRAVENOUS
  Administered 2024-03-02: 50 mg via INTRAVENOUS

## 2024-03-02 NOTE — Assessment & Plan Note (Addendum)
 Hgb stable post 2 PRBC transfusion.  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 Full liquid diet today, advance to soft tonight. If he does well overnight can go home tomorrow. GI office to coordinate f/u labs in one week. Continue Protonix  and octreotide  depot injections at home.  - Pain management: Tylenol  650 mg every 6 hours as needed for pain - Continue home Protonix  40 BID  - Home Octreotide  injection every 28 days, last given 10/21 - Delerium precautions - AM CBC, BMP

## 2024-03-02 NOTE — Assessment & Plan Note (Signed)
 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

## 2024-03-02 NOTE — Telephone Encounter (Signed)
 CBC order entered. Will contact pts family next week re repeat labs

## 2024-03-02 NOTE — TOC Progression Note (Signed)
 Transition of Care [facility]) - Progression Note    Patient Details  Name: Gregory Nunez. MRN: [id number] Date of Birth: 12-31-1945  Transition of Care [facility]) CM/SW Contact  Lauraine FORBES Saa, LCSWA Phone Number: 03/02/2024, 9:28 AM  Clinical Narrative:     9:28 AM Per MD resident, patient is anticipated to discharge this weekend pending GI recommendations. [facility] SN[facility] LTC made aware. CSW will continue to follow.  Expected Discharge Plan: Long Term Nursing Home Barriers to Discharge: Continued Medical Work up               Expected Discharge Plan and Services In-house Referral: Clinical Social Work   Post Acute Care Choice: Skilled Nursing Facility Living arrangements for the past 2 months: Skilled Nursing Facility                                       Social Drivers of Health (SDOH) Interventions SDOH Screenings   Food Insecurity: Patient Unable To Answer (02/29/2024)  Housing: Low Risk  (02/29/2024)  Transportation Needs: No Transportation Needs (02/29/2024)  Utilities: Not At Risk (02/29/2024)  Alcohol Screen: Low Risk  (09/21/2023)  Depression (PHQ2-9): Low Risk  (11/03/2022)  Financial Resource Strain: Low Risk  (09/21/2023)  Physical Activity: Insufficiently Active (09/21/2023)  Social Connections: Socially Isolated (02/29/2024)  Stress: No Stress Concern Present (09/21/2023)  Tobacco Use: Medium Risk (02/27/2024)  Health Literacy: Adequate Health Literacy (11/03/2022)    Readmission Risk Interventions    10/17/2023    4:48 PM 06/20/2023    2:55 PM 06/15/2023    3:00 PM  Readmission Risk Prevention Plan  Post Dischage Appt   Complete  Medication Screening   Complete  Transportation Screening Complete Complete Complete  PCP or Specialist Appt within 5-7 Days  Complete   Home Care Screening  Complete   Medication Review (RN CM)  Complete

## 2024-03-02 NOTE — Telephone Encounter (Signed)
-----   Message from Elspeth SHAUNNA Naval sent at 03/02/2024 11:26 AM EST ----- Regarding: follow up labs Jan can you help coordinate a CBC in one week for this patient - will need to communicate with his family, patient has dementia. Thanks. Can contact them next week, he will be in the hospital today. Thanks

## 2024-03-02 NOTE — Plan of Care (Signed)
 FMTS Interim Progress Note  S: Patient seen at bedside following enteroscopy with AVM ablations.  Patient found sitting up comfortably in bed and requesting food.  He denies abdominal pain, nausea, vomiting, chest pain, SOB, or dizziness.  No other complaints at this time.  O: BP 115/67 (BP Location: Right Arm)   Pulse 87   Temp 97.8 F (36.6 C) (Oral)   Resp 16   Ht 5' 9 (1.753 m)   Wt 67.3 kg   SpO2 100%   BMI 21.91 kg/m     A/P: - Start soft diet, advance as tolerated - Proceed with plan as outlined MD progress note  Elodie Palma, MD 03/02/2024, 2:38 PM PGY-1, [facility] Medicine Service pager [phone]

## 2024-03-02 NOTE — Op Note (Signed)
 [facility] Patient Name: Gregory Nunez Procedure Date : 03/02/2024 MRN: [id number] Attending MD: Elspeth SQUIBB. Leigh , MD, [id number] Date of Birth: [birth year] CSN: [id number] Age: 78 Admit Type: Inpatient Procedure:                Small bowel enteroscopy Indications:              Arteriovenous malformation in the small intestine -                            prior small bowel enteroscopies with ablation of                            numerous AVMs - capsule endoscopy yesterday shows                            AVMs in all portions of the small intestine,                            highest burden in proximal small bowel. Enteroscopy                            to ablate AVMs to see if this will help with                            anemia. On octreotide  depot as outpatient Providers:                Elspeth P. Leigh, MD, Darleene Bare, RN,                            Haskel Chris, Technician Referring MD:              Medicines:                Monitored Anesthesia Care Complications:            No immediate complications. Estimated blood loss:                            Minimal. Estimated Blood Loss:     Estimated blood loss was minimal. Procedure:                Pre-Anesthesia Assessment:                           - Prior to the procedure, a History and Physical                            was performed, and patient medications and                            allergies were reviewed. The patient's tolerance of                            previous anesthesia was also reviewed. The risks  and benefits of the procedure and the sedation                            options and risks were discussed with the patient.                            All questions were answered, and informed consent                            was obtained. Prior Anticoagulants: The patient has                            taken no anticoagulant or antiplatelet agents. ASA                             Grade Assessment: III - A patient with severe                            systemic disease. After reviewing the risks and                            benefits, the patient was deemed in satisfactory                            condition to undergo the procedure.                           After obtaining informed consent, the endoscope was                            passed under direct vision. Throughout the                            procedure, the patient's blood pressure, pulse, and                            oxygen  saturations were monitored continuously. The                            PCF-H190TL ([id number]) Olympus colonoscope was                            introduced through the mouth and advanced to the                            proximal jejunum. The small bowel enteroscopy was                            accomplished without difficulty. The patient                            tolerated the procedure well. Scope In: Scope Out: Findings:      The Z-line was regular and was found 40 cm from the incisors.  A small hiatal hernia was present.      The exam of the esophagus was otherwise normal.      A few small angiodysplastic lesions with stigmata of recent bleeding       (adherent heme) were found in the gastric antrum. Fulguration to ablate       the lesions to prevent bleeding by argon plasma was successful.      The exam of the stomach was otherwise normal.      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       Fulguration to ablate the lesions to prevent bleeding by argon plasma       was successful. One of the lesions in the second portion had bleeding       with APC ablation. Took multiple applications of APC to stop it. To       prevent bleeding post-intervention, two hemostatic clips were       successfully placed across the area.      Mucosal changes were  found in the ampulla - it appeared prominent /       enlarged. Biopsies were taken with a cold forceps for histology to rule       out adenoma.      The exam of the duodenum was otherwise normal.      A single angiodysplastic lesion with no bleeding was found in the       proximal jejunum. Fulguration to ablate the lesion to prevent bleeding       by argon plasma was successful.      Exam of the jejunum was otherwise normal. Impression:               - Z-line regular, 40 cm from the incisors.                           - Small hiatal hernia.                           - Normal esophagus otherwise.                           - A few recently bleeding angiodysplastic lesions                            in the stomach. Treated with argon plasma                            coagulation (APC).                           - Three non-bleeding angiodysplastic lesions in the                            duodenum. Treated with argon plasma coagulation                            (APC). 2 Clips were placed across one lesion that  developed bleeding while treating with APC.                           - Mucosal changes of the ampulla as outlined.                            Biopsied.                           - A single non-bleeding angiodysplastic lesion in                            the jejunum. Treated with argon plasma coagulation                            (APC).                           Multiple AVMs treated, hopefully this helps with                            his anemia. Continue octreotide  depot as outpatient                            as well as protonix . Recommendation:           - Return patient to hospital ward for ongoing care.                           - Liquid diet now, advance to soft later today                           - Continue present medications.                           - Await pathology results.                           - Trend Hgb tomorrow - if stable can  be discharged                            tomorrow.                           - Continue octreotide  as outpatient as well as                            protonix                            - Our office will coordinate blood work to be done                            in one week                           - Call with questions, GI  service will sign off for                            now. I will contact the patient's family to relay                            recommendations Procedure Code(s):        --- Professional ---                           [phone], 59, Small intestinal endoscopy, enteroscopy                            beyond second portion of duodenum, not including                            ileum; with control of bleeding (eg, injection,                            bipolar cautery, unipolar cautery, laser, heater                            probe, stapler, plasma coagulator)                           44361, Small intestinal endoscopy, enteroscopy                            beyond second portion of duodenum, not including                            ileum; with biopsy, single or multiple Diagnosis Code(s):        --- Professional ---                           K44.9, Diaphragmatic hernia without obstruction or                            gangrene                           K31.811, Angiodysplasia of stomach and duodenum                            with bleeding                           K31.89, Other diseases of stomach and duodenum CPT copyright 2022 American [employer] Association. All rights reserved. The codes documented in this report are preliminary and upon coder review may  be revised to meet current compliance requirements. Elspeth P. Anders Hohmann, MD 03/02/2024 11:21:08 AM This report has been signed electronically. Number of Addenda: 0

## 2024-03-02 NOTE — Anesthesia Postprocedure Evaluation (Signed)
 Anesthesia Post Note  Patient: Gregory Nunez.  Procedure(s) Performed: ENTEROSCOPY     Patient location during evaluation: PACU Anesthesia Type: MAC Level of consciousness: awake and alert Pain management: pain level controlled Vital Signs Assessment: post-procedure vital signs reviewed and stable Respiratory status: spontaneous breathing, nonlabored ventilation and respiratory function stable Cardiovascular status: stable and blood pressure returned to baseline Postop Assessment: no apparent nausea or vomiting Anesthetic complications: no   No notable events documented.  Last Vitals:  Vitals:   03/02/24 1130 03/02/24 1221  BP: 113/88 115/67  Pulse: 98 87  Resp: 16 16  Temp:  36.6 C  SpO2: 100% 100%    Last Pain:  Vitals:   03/02/24 1221  TempSrc: Oral  PainSc:                  Maleiya Pergola A.

## 2024-03-02 NOTE — Progress Notes (Signed)
 Daily Progress Note Intern Pager: [phone]  Patient name: Gregory Nunez. Medical record number: [id number] Date of birth: 1945/05/26 Age: 78 y.o. 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 is a 78 y.o. male with hx of chronic GI bleed, CVA, dementia, HTN presenting from long term care facility sent by outpatient GI for acute anemia, Hgb 7.4. Hospital course has been uneventful, however, the patient will be taken to the OR for intestinal AVM ablation today, 11/14. Assessment & Plan Acute on chronic anemia AVM (arteriovenous malformation) of small bowel, acquired with hemorrhage Hgb stable post 2 PRBC transfusion.  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 Full liquid diet today, advance to soft tonight. If he does well overnight can go home tomorrow. GI office to coordinate f/u labs in one week. Continue Protonix  and octreotide  depot injections at home.  - Pain management: Tylenol  650 mg every 6 hours as needed for pain - Continue home Protonix  40 BID  - Home Octreotide  injection every 28 days, last given 10/21 - Delerium precautions - AM CBC, BMP Controlled type 2 diabetes mellitus without complication, without long-term current use of insulin  (HCC) Well-controlled, currently not on medication. Last A1c 5.1 in 09/2023.  - Sensitive sliding scale insulin  during admission - CBG qACHS Chronic hepatitis C (HCC) Per Antoinette primary nurse for patient at long term care facility, patient has received 6 weeks of Mevyret  between 08-12/2023 and none since due to hospital visits.  - Pharmacy consulted, appreciate recs  - Could be cleared with 6 week tx however this is a shorter duration than recommended. Check HCV RNA first, if positive, refer him to [facility] clinic for retreatment (longer duration, and they will likely want to start him on alternate therapy) -Awaiting HCV RNA results; last 3.548 08/16/2022 CKD (chronic kidney disease), stage V (HCC) Creatinine worsening most likely secondary to poor PO intake with NPO around multiple procedures while hospitalized.  - Continue to monitor, - Encourage p.o. hydration  - Outpatient nephrology follow up at d/c  Chronic health problem HTN: Continue home amlodipine  10 mg daily, continue home hydralazine  25 mg every 8 hours CVA: Continue home aspirin  81 mg daily HLD: Continue home atorvastatin  80 mg daily GERD: Continue home pantoprazole  40 mg twice daily B1 deficiency: Continue home thiamine  tablet 100 mg daily  FEN/GI: Regular diet  PPx: SCDs Dispo:Long term care facility pending clinical improvement. Barriers include clearance from GI post ablation procedure.   Subjective:  Patient was seen and evaluated at bedside. There were no overnight events.  Doing well, no complaints this AM.   Objective: Temp:  [97.3 F (36.3 C)-99 F (37.2 C)] 98.1 F (36.7 C) (11/14 0739) Pulse Rate:  [86-104] 86 (11/14 0739) Resp:  [16-18] 16 (11/14 0739) BP: (127-156)/(71-86) 127/71 (11/14 0739) SpO2:  [100 %] 100 % (11/14 0739) Physical Exam: General: resting comfortably lying supine in hospital bed in no acute distress Cardiovascular: RRR, no murmurs, 2+ radial pulses Respiratory: CTAB, normal WOB on RA Abdomen: soft, non-tender, non-distended, BS present Extremities: no peripheral edema, moves  all extremities equally   Laboratory: Most recent CBC Lab Results  Component Value Date   WBC 5.7 03/01/2024   HGB 10.7 (L) 03/01/2024   HCT 30.9 (L) 03/01/2024   MCV 89.3  03/01/2024   PLT 136 (L) 03/01/2024   Most recent BMP    Latest Ref Rng & Units 03/01/2024    4:34 AM  BMP  Glucose 70 - 99 mg/dL 894   BUN 8 - 23 mg/dL 66   Creatinine 9.38 - 1.24 mg/dL 6.23   Sodium 864 - 854 mmol/L 136   Potassium 3.5 - 5.1 mmol/L 3.7   Chloride 98 - 111 mmol/L 104   CO2 22 - 32 mmol/L 21   Calcium  8.9 - 10.3 mg/dL 8.8    Lupie Credit, DO 03/02/2024, 7:51 AM  PGY-1, [place] [facility] FPTS Intern pager: [phone], text pages welcome Secure chat group [facility] Teaching Service

## 2024-03-02 NOTE — Care Management Important Message (Signed)
 Important Message  Patient Details  Name: Gregory Nunez. MRN: [id number] Date of Birth: January 22, 1946   Important Message Given:  Yes - Medicare IM     Claretta Deed 03/02/2024, 3:21 PM

## 2024-03-02 NOTE — Transfer of Care (Signed)
 Immediate Anesthesia Transfer of Care Note  Patient: Gregory Nunez.  Procedure(s) Performed: ENTEROSCOPY  Patient Location: PACU  Anesthesia Type:MAC  Level of Consciousness: awake and drowsy  Airway & Oxygen  Therapy: Patient Spontanous Breathing and Patient connected to face mask oxygen   Post-op Assessment: Report given to RN and Post -op Vital signs reviewed and stable  Post vital signs: Reviewed and stable  Last Vitals:  Vitals Value Taken Time  BP 105/68 03/02/24 11:10  Temp    Pulse 92 03/02/24 11:11  Resp 16 03/02/24 11:11  SpO2 100 % 03/02/24 11:11  Vitals shown include unfiled device data.  Last Pain:  Vitals:   03/02/24 0954  TempSrc: Temporal  PainSc: 0-No pain      Patients Stated Pain Goal: 0 (03/02/24 0954)  Complications: No notable events documented.

## 2024-03-02 NOTE — Interval H&P Note (Signed)
 History and Physical Interval Note: Hgb stable overnight. No bleeding symptoms, passing brown stool in endo this AM on pre-op evaluation. Capsule endoscopy showed numerous AVMs in the bowel, some in proximal bowel. I have offered enteroscopy to ablate any AVMs seen to see if this will reduce his risk of recurrent anemia while he continues octreotide . Spoke with his health care proxy Aengus Sauceda yesterday who consented on the patient's behalf after discussion of risks / benefits. They wish to proceed. All questions answered.  03/02/2024 9:42 AM  Debby Duos Louder Raddle.  has presented today for surgery, with the diagnosis of anemia, small bowel AVMs.  The various methods of treatment have been discussed with the patient and family. After consideration of risks, benefits and other options for treatment, the patient has consented to  Procedure(s): ENTEROSCOPY (N/A) as a surgical intervention.  The patient's history has been reviewed, patient examined, no change in status, stable for surgery.  I have reviewed the patient's chart and labs.  Questions were answered to the patient's satisfaction.     Elspeth P Katrisha Segall

## 2024-03-02 NOTE — Plan of Care (Signed)

## 2024-03-02 NOTE — Assessment & Plan Note (Addendum)
 Creatinine worsening most likely secondary to poor PO intake with NPO around multiple procedures while hospitalized.  - Continue to monitor, - Encourage p.o. hydration  - Outpatient nephrology follow up at d/c

## 2024-03-02 NOTE — Assessment & Plan Note (Signed)
 Well-controlled, currently not on medication. Last A1c 5.1 in 09/2023.  -Sensitive sliding scale insulin  during admission -CBG qACHS

## 2024-03-02 NOTE — Progress Notes (Signed)
 Insulin  was ordered Q4. Changed to before meals and bedtime as pt is now a ENGINEER, CIVIL (CONSULTING)

## 2024-03-02 NOTE — Assessment & Plan Note (Addendum)
 Per Antoinette primary nurse for patient at long term care facility, patient has received 6 weeks of Mevyret between 08-12/2023 and none since due to hospital visits.  - Pharmacy consulted, appreciate recs  - Could be cleared with 6 week tx however this is a shorter duration than recommended. Check HCV RNA first, if positive, refer him to [facility] clinic for retreatment (longer duration, and they will likely want to start him on alternate therapy) -Awaiting HCV RNA results; last 3.548 08/16/2022

## 2024-03-02 NOTE — Anesthesia Preprocedure Evaluation (Signed)
 Anesthesia Evaluation  Patient identified by MRN, date of birth, ID band Patient awake    Reviewed: Allergy & Precautions, NPO status , Patient's Chart, lab work & pertinent test results  Airway Mallampati: II  TM Distance: >3 FB Neck ROM: Full    Dental  (+) Edentulous Upper, Edentulous Lower   Pulmonary shortness of breath and with exertion, COPD,  COPD inhaler, Patient abstained from smoking., former smoker   Pulmonary exam normal breath sounds clear to auscultation       Cardiovascular hypertension, Pt. on medications Normal cardiovascular exam Rhythm:Regular Rate:Normal     Neuro/Psych Seizures -,  PSYCHIATRIC DISORDERS  Depression     Neuromuscular disease CVA, Residual Symptoms    GI/Hepatic PUD,GERD  Medicated,,(+)     substance abuse  alcohol use, Hepatitis -, CSmall bowel AVM's   Endo/Other  diabetes, Well Controlled, Type 2, Oral Hypoglycemic Agents    Renal/GU Renal InsufficiencyRenal disease  negative genitourinary   Musculoskeletal negative musculoskeletal ROS (+)    Abdominal   Peds  Hematology  (+) Blood dyscrasia, anemia   Anesthesia Other Findings   Reproductive/Obstetrics                              Anesthesia Physical Anesthesia Plan  ASA: 3  Anesthesia Plan: MAC   Post-op Pain Management: Minimal or no pain anticipated   Induction: Intravenous  PONV Risk Score and Plan: 2 and Propofol  infusion and Treatment may vary due to age or medical condition  Airway Management Planned: Simple Face Mask and Natural Airway  Additional Equipment: None  Intra-op Plan:   Post-operative Plan:   Informed Consent: I have reviewed the patients History and Physical, chart, labs and discussed the procedure including the risks, benefits and alternatives for the proposed anesthesia with the patient or authorized representative who has indicated his/her understanding and  acceptance.     Dental advisory given  Plan Discussed with: CRNA and Anesthesiologist  Anesthesia Plan Comments:          Anesthesia Quick Evaluation

## 2024-03-03 ENCOUNTER — Encounter (HOSPITAL_COMMUNITY): Payer: Self-pay | Admitting: Gastroenterology

## 2024-03-03 DIAGNOSIS — D649 Anemia, unspecified: Secondary | ICD-10-CM | POA: Diagnosis not present

## 2024-03-03 DIAGNOSIS — K5521 Angiodysplasia of colon with hemorrhage: Secondary | ICD-10-CM | POA: Diagnosis not present

## 2024-03-03 LAB — CBC
HCT: 29 % — ABNORMAL LOW (ref 39.0–52.0)
Hemoglobin: 9.9 g/dL — ABNORMAL LOW (ref 13.0–17.0)
MCH: 30.9 pg (ref 26.0–34.0)
MCHC: 34.1 g/dL (ref 30.0–36.0)
MCV: 90.6 fL (ref 80.0–100.0)
Platelets: 134 K/uL — ABNORMAL LOW (ref 150–400)
RBC: 3.2 MIL/uL — ABNORMAL LOW (ref 4.22–5.81)
RDW: 14.4 % (ref 11.5–15.5)
WBC: 6.5 K/uL (ref 4.0–10.5)
nRBC: 0 % (ref 0.0–0.2)

## 2024-03-03 LAB — GLUCOSE, CAPILLARY
Glucose-Capillary: 115 mg/dL — ABNORMAL HIGH (ref 70–99)
Glucose-Capillary: 129 mg/dL — ABNORMAL HIGH (ref 70–99)
Glucose-Capillary: 151 mg/dL — ABNORMAL HIGH (ref 70–99)

## 2024-03-03 LAB — BASIC METABOLIC PANEL WITH GFR
Anion gap: 13 (ref 5–15)
BUN: 73 mg/dL — ABNORMAL HIGH (ref 8–23)
CO2: 19 mmol/L — ABNORMAL LOW (ref 22–32)
Calcium: 8.5 mg/dL — ABNORMAL LOW (ref 8.9–10.3)
Chloride: 105 mmol/L (ref 98–111)
Creatinine, Ser: 4.35 mg/dL — ABNORMAL HIGH (ref 0.61–1.24)
GFR, Estimated: 13 mL/min — ABNORMAL LOW (ref >60)
Glucose, Bld: 157 mg/dL — ABNORMAL HIGH (ref 70–99)
Potassium: 3.9 mmol/L (ref 3.5–5.1)
Sodium: 137 mmol/L (ref 135–145)

## 2024-03-03 NOTE — Progress Notes (Signed)
 Called and was on hold for over 5 minutes to give report. Will call again later.

## 2024-03-03 NOTE — TOC Transition Note (Signed)
 Transition of Care [facility]) - Discharge Note   Patient Details  Name: Gregory Nunez. MRN: [id number] Date of Birth: 1946/01/31  Transition of Care [facility]) CM/SW Contact:  Gwenn Julien Norris, [place] Phone Number: 03/03/2024, 12:24 PM   Clinical Narrative: Pt for dc back to [facility] where he is a LTC resident. Spoke to Kia in admissions who confirmed they are prepared to admit pt to room 222A. Pt's nephew Ozell aware of dc and reports agreeable. RN provided with number for report and [facility] arranged for transport. SW signing off at dc.   Julien Gwenn, MSW, LCSW [phone] (coverage)        Final next level of care: Skilled Nursing Facility Barriers to Discharge: Barriers Resolved   Patient Goals and CMS Choice            Discharge Placement              Patient chooses bed at: [facility] Patient to be transferred to facility by: [facility] Name of family member notified: Michael/nephew Patient and family notified of of transfer: 03/03/24  Discharge Plan and Services Additional resources added to the After Visit Summary for   In-house Referral: Clinical Social Work   Post Acute Care Choice: Skilled Nursing Facility                               Social Drivers of Health (SDOH) Interventions SDOH Screenings   Food Insecurity: Patient Unable To Answer (02/29/2024)  Housing: Low Risk  (02/29/2024)  Transportation Needs: No Transportation Needs (02/29/2024)  Utilities: Not At Risk (02/29/2024)  Alcohol Screen: Low Risk  (09/21/2023)  Depression (PHQ2-9): Low Risk  (11/03/2022)  Financial Resource Strain: Low Risk  (09/21/2023)  Physical Activity: Insufficiently Active (09/21/2023)  Social Connections: Socially Isolated (02/29/2024)  Stress: No Stress Concern Present (09/21/2023)  Tobacco Use: Medium Risk (03/02/2024)  Health Literacy: Adequate Health Literacy (11/03/2022)     Readmission Risk Interventions    10/17/2023    4:48 PM  06/20/2023    2:55 PM 06/15/2023    3:00 PM  Readmission Risk Prevention Plan  Post Dischage Appt   Complete  Medication Screening   Complete  Transportation Screening Complete Complete Complete  PCP or Specialist Appt within 5-7 Days  Complete   Home Care Screening  Complete   Medication Review (RN CM)  Complete

## 2024-03-03 NOTE — Assessment & Plan Note (Signed)
 Well-controlled, currently not on medication. Last A1c 5.1 in 09/2023.  -Sensitive sliding scale insulin  during admission -CBG qACHS

## 2024-03-03 NOTE — Assessment & Plan Note (Signed)
 Creatinine worsening most likely secondary to poor PO intake with NPO around multiple procedures while hospitalized.  - Continue to monitor, - Encourage p.o. hydration  - Outpatient nephrology follow up at d/c

## 2024-03-03 NOTE — Progress Notes (Addendum)
 Daily Progress Note Intern Pager: [phone]  Patient name: Gregory Nunez. Medical record number: [id number] Date of birth: [birth year] Age: 78 y.o. Gender: male  Primary Care Provider: Charmayne Holmes, DO Consultants: GI Code Status: Full  Pt Overview and Major Events to Date:  11/11: Admitted for acute anemia 2/2 chronic duodenal AVMs 11/12: Capsule endoscopy by GI indicative of multiple bleeding intestinal AVMs 11/14: AVM ablation by GI  Medical Decision Making:   Gregory Meribeth Diogo Anne. is a 78 y.o. male with hx of chronic GI bleed, CVA, dementia, HTN presenting from long term care facility sent by outpatient GI for acute anemia, Hgb 7.4. Hospital course has been uneventful, however, the patient will be taken to the OR for intestinal AVM ablation today, 11/14. Assessment & Plan AVM (arteriovenous malformation) of small bowel, acquired with hemorrhage Acute on chronic anemia Gastric AVM H/H has been stable after AVM Ablation. He received 2 units of pRBC during this hospitalization  Advanced to soft diet OVN from Full liquid diet - GI consulted, appreciate recs  - Capsule endoscopy 11/12: multiple intestinal AVMs causing chronic blood loss  - Intestinal AVM ablation 11/14: AVMs ablated in the stomach, duodenum, and jejunum - GI office to coordinate f/u labs in one week.  - Continue Protonix  and octreotide  depot injections at home.  - Pain management: Tylenol  650 mg every 6 hours as needed for pain - Continue home Protonix  40 BID  - Home Octreotide  injection every 28 days, last given 10/21 - Delerium precautions - AM CBC, BMP Controlled type 2 diabetes mellitus without complication, without long-term current use of insulin  (HCC) Well-controlled, currently not on medication. Last A1c 5.1 in 09/2023.  - Sensitive sliding scale insulin  during admission - CBG qACHS Chronic hepatitis C (HCC) Per Antoinette primary nurse for patient at long term care facility, patient has  received 6 weeks of Mevyret between 08-12/2023 and none since due to hospital visits.  - Pharmacy consulted, appreciate recs  - Could be cleared with 6 week tx however this is a shorter duration than recommended. - Plan on Check HCV RNA, if positive, will refer him to [facility] clinic for retreatment (longer duration, and they will likely want to start him on alternate therapy) - Negative HCV RNA results on 02/29/24  CKD (chronic kidney disease), stage V (HCC) Creatinine worsening most likely secondary to poor PO intake with NPO around multiple procedures while hospitalized.  - Continue to monitor, - Encourage p.o. hydration  - Outpatient nephrology follow up at d/c  Chronic health problem HTN: Continue home amlodipine  10 mg daily, continue home hydralazine  25 mg every 8 hours CVA: Continue home aspirin  81 mg daily HLD: Continue home atorvastatin  80 mg daily GERD: Continue home pantoprazole  40 mg twice daily B1 deficiency: Continue home thiamine  tablet 100 mg daily  FEN/GI: Regular diet  PPx: SCDs Dispo:Long term care facility pending clinical improvement. Barriers include clearance from GI post ablation procedure.   Subjective:  Doing well this morning. Rest comfortable in bed after AM bath. Denies coughing up blood or blood in stool. Last BM was yesterday 03/02/24 per patient   Objective: Temp:  [96.8 F (36 C)-98.4 F (36.9 C)] 98.2 F (36.8 C) (11/15 0448) Pulse Rate:  [78-98] 78 (11/15 0448) Resp:  [16-18] 18 (11/15 0448) BP: (98-146)/(66-88) 146/80 (11/15 0448) SpO2:  [98 %-100 %] 98 % (11/15 0448) Weight:  [67.3 kg] 67.3 kg (11/14 0954) Physical Exam: General: resting comfortably lying supine in hospital bed in  no acute distress Cardiovascular: RRR, no murmurs, 2+ radial pulses Respiratory: CTAB, normal WOB on RA Abdomen: soft, non-tender, non-distended, BS present Extremities: no peripheral edema, moves all extremities equally   Laboratory: Most recent CBC Lab Results   Component Value Date   WBC 6.5 03/03/2024   HGB 9.9 (L) 03/03/2024   HCT 29.0 (L) 03/03/2024   MCV 90.6 03/03/2024   PLT 134 (L) 03/03/2024   Most recent BMP    Latest Ref Rng & Units 03/02/2024    8:08 AM  BMP  Glucose 70 - 99 mg/dL 891   BUN 8 - 23 mg/dL 72   Creatinine 9.38 - 1.24 mg/dL 5.69   Sodium 864 - 854 mmol/L 139   Potassium 3.5 - 5.1 mmol/L 4.1   Chloride 98 - 111 mmol/L 106   CO2 22 - 32 mmol/L 18   Calcium  8.9 - 10.3 mg/dL 8.5    Suzen Elder B, DO 03/03/2024, 5:23 AM  PGY-1, [place] [facility] FPTS Intern pager: [phone], text pages welcome Secure chat group [facility] Teaching Service

## 2024-03-03 NOTE — Assessment & Plan Note (Signed)
 H/H has been stable after AVM Ablation. He received 2 units of pRBC during this hospitalization  Advanced to soft diet OVN from Full liquid diet - GI consulted, appreciate recs  - Capsule endoscopy 11/12: multiple intestinal AVMs causing chronic blood loss  - Intestinal AVM ablation 11/14: AVMs ablated in the stomach, duodenum, and jejunum - GI office to coordinate f/u labs in one week.  - Continue Protonix  and octreotide  depot injections at home.  - Pain management: Tylenol  650 mg every 6 hours as needed for pain - Continue home Protonix  40 BID  - Home Octreotide  injection every 28 days, last given 10/21 - Delerium precautions - AM CBC, BMP

## 2024-03-03 NOTE — Assessment & Plan Note (Addendum)
 Per Antoinette primary nurse for patient at long term care facility, patient has received 6 weeks of Mevyret between 08-12/2023 and none since due to hospital visits.  - Pharmacy consulted, appreciate recs  - Could be cleared with 6 week tx however this is a shorter duration than recommended. - Plan on Check HCV RNA, if positive, will refer him to [facility] clinic for retreatment (longer duration, and they will likely want to start him on alternate therapy) - Negative HCV RNA results on 02/29/24

## 2024-03-03 NOTE — Discharge Summary (Shared)
 Family Medicine Teaching Service [facility] [place] [facility] Discharge Summary  Patient name: Gregory Nunez. Medical record number: [id number] Date of birth: 03-17-1946 Age: 78 y.o. Gender: male Date of Admission: 02/28/2024  Date of Discharge: *** Admitting Physician: Camie Dixons, DO  Primary Care Provider: Charmayne Holmes, DO Consultants: GI  Indication for Hospitalization: Gastric AVM, GI bleed  Discharge Diagnoses/Problem List:  Principal Problem for Admission: Gastric AVM Other Problems addressed during stay:  Principal Problem:   AVM (arteriovenous malformation) of small bowel, acquired with hemorrhage Active Problems:   Controlled type 2 diabetes mellitus without complication, without long-term current use of insulin  (HCC)   Chronic hepatitis C (HCC)   Chronic health problem   Acute on chronic anemia   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   Brief Nunez Course:  Gregory Mcevers. is a 78 y.o.male with a history of chronic GI bleed secondary to chronic duodenal AVMs, CVA, dementia, HTN, HLD, GERD, vitamin B1 deficiency who was admitted to the family medicine teaching Service at [facility] for acute anemia secondary to GI bleed. His Nunez course is detailed below:  Acute on chronic anemia AVM (arteriovenous malformation) of small bowel, acquired with hemorrhage Gregory Nunez was sent to the [facility] ED after outpatient GI provider found hemoglobin of 7.4 on lab work.  The patient was asymptomatic.  On arrival, admission hemoglobin was 7.0.  The patient received 2 units PRBC and was followed by GI.  He received capsule endoscopy 11/12 with the following results Gregory Nunez course was uneventful and patient was discharged with stable hemoglobin of ***.  Controlled type 2  diabetes mellitus without complication, without long-term current use of insulin   Well-controlled, currently not on medication. Last A1c 5.1 in 09/2023.  Patient was placed on sensitive sliding scale insulin  during admission without complications.  Chronic hepatitis C  History of chronic hepatitis C with 6-week treatment with Mavyret  08-12/2023 confirmed with patient's primary nurse Antoinette at his long-term healthcare facility [facility].  An HCV RNA was checked *** patient was referred to our [facility] clinic for retreatment ***.  CKD (chronic kidney disease), stage V  Creatinine of 4.33 on admission which was consistent with patient baseline. Not on dialysis.  Other chronic conditions were medically managed with home medications and formulary alternatives as necessary (hypertension, CVA, HLD, GERD, vitamin B1 deficiency)  PCP Follow-up Recommendations: Close GI follow-up ID follow-up Ensure Nephrology follow up within a week of discharge      Results/Tests Pending at Time of Discharge:  Unresulted Labs (From admission, onward)     Start     Ordered   03/03/24 0500  Basic metabolic panel with GFR  Tomorrow morning,   R        03/02/24 1035             Disposition: ***  Discharge Condition: ***  Discharge Exam:  Vitals:   03/02/24 2008 03/03/24 0448  BP: (!) 142/81 (!) 146/80  Pulse: 85 78  Resp: 18 18  Temp: 97.6 F (36.4 C) 98.2 F (36.8 C)  SpO2: 100% 98%   ***  Significant Procedures: ***  Significant Labs and Imaging:  Recent Labs  Lab 03/02/24 0808 03/03/24 0432  WBC 5.5 6.5  HGB 10.3* 9.9*  HCT 31.4* 29.0*  PLT 138* 134*   Recent Labs  Lab 03/02/24 0808  NA 139  K 4.1  CL 106  CO2 18*  GLUCOSE 108*  BUN 72*  CREATININE 4.30*  CALCIUM  8.5*    *** Pertinent Imaging ***    Discharge Medications:  Allergies as of 03/03/2024       Reactions   Penicillins Anaphylaxis, Other (See Comments)   Pt does not remember reaction  but states he woke up in the Nunez after taking     Med Rec must be completed prior to using this [facility]***       Discharge Instructions: Please refer to Patient Instructions section of EMR for full details.  Patient was counseled important signs and symptoms that should prompt return to medical care, changes in medications, dietary instructions, activity restrictions, and follow up appointments.   Follow-Up Appointments:   Suzen Houston NOVAK, DO 03/03/2024, 5:16 AM PGY-1, [facility]

## 2024-03-03 NOTE — Discharge Instructions (Addendum)
 Dear Debby Duos [name].,  Thank you for letting us  participate in your care. You were hospitalized for Gastric AVM result in GI bleed and diagnosed with AVM (arteriovenous malformation) of small bowel, acquired with hemorrhage. You were treated with Intestinal AVM ablation 11/14: AVMs ablated in the stomach, duodenum, and jejunum .   POST-HOSPITAL & CARE INSTRUCTIONS F/u with GI in 1 weeks  Please continue Continue Protonix  and octreotide  depot injections at home.  Go to your follow up appointments (listed below)  DOCTOR'S APPOINTMENT   Future Appointments  Date Time Provider Department Center  03/06/2024 12:00 PM Tina Pauletta BROCKS, MD [facility] None  03/06/2024 12:30 PM [facility] None  09/26/2024 11:50 AM [facility] ANNUAL WELLNESS VISIT [facility] [address]     Take care and be well!  Family Medicine Teaching Service Inpatient Team [place]  [facility]  [address] [place], [place] [address] [phone]

## 2024-03-03 NOTE — Progress Notes (Signed)
 Report given to [place]

## 2024-03-03 NOTE — Discharge Summary (Signed)
 Family Medicine Teaching Service [facility] Discharge Summary  Patient name: Gregory Nunez. Medical record number: [id number] Date of birth: 06-Oct-1945 Age: 78 y.o. Gender: male Date of Admission: 02/28/2024  Date of Discharge: 03/03/24  Admitting Physician: Camie Dixons, DO  Primary Care Provider: Charmayne Holmes, DO Consultants: GI  Indication for Hospitalization: Acute on chronic anemia  Discharge Diagnoses/Problem List:  Principal Problem for Admission: Acute on chronic anemia Other Problems addressed during stay:  Principal Problem:   AVM (arteriovenous malformation) of small bowel, acquired with hemorrhage Active Problems:   Controlled type 2 diabetes mellitus without complication, without long-term current use of insulin  (HCC)   Chronic hepatitis C (HCC)   Acute on chronic anemia   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 His hospital course is detailed below:  Acute on chronic anemia AVM (arteriovenous malformation) of small bowel, acquired with hemorrhage Gregory Nunez was sent to the [facility] ED after outpatient GI provider found hemoglobin of 7.4 on lab work.  The patient was asymptomatic.  On arrival, admission hemoglobin was 7.0.  The patient received 2 units PRBC and was followed by GI.  He received capsule endoscopy 11/12 with shows multiple intestinal AVMs causing chronic blood loss which he undergone for Intestinal AVM ablation in the stomach, duodenum, and jejunum on 11/14. His hospital course was uneventful after the procedure he will need to continue Protonix  and octreotide  depot  injections at home and f/u with outpatient GI in 1 week after discharge. Patient was discharged with stable hemoglobin   Controlled type 2 diabetes mellitus without complication, without long-term current use of insulin   Well-controlled, currently not on medication. Last A1c 5.1 in 09/2023.  Patient was placed on sensitive sliding scale insulin  during admission without complications.  Chronic hepatitis C  History of chronic hepatitis C with 6-week treatment with Mavyret  08-12/2023 confirmed with patient's primary nurse Antoinette at his long-term healthcare facility [facility].  An HCV RNA was checked on 02/29/2024 and was not detected   CKD (chronic kidney disease), stage V  Creatinine of 4.33 on admission which was consistent with patient baseline. Not on dialysis.  Other chronic conditions were medically managed with home medications and formulary alternatives as necessary (hypertension, CVA, HLD, GERD, vitamin B1 deficiency)  PCP Follow-up Recommendations: Ensure GI follow-up in 1 week Ensure Nephrology follow up for CKD  ID follow-up for HCV management      Results/Tests Pending at Time of Discharge:  Unresulted Labs (From admission, onward)    None       Disposition: Return to long-term care facility [name]  Discharge Condition: Improved, stable  Discharge Exam:  Vitals:   03/03/24 0448 03/03/24 0743  BP: (!) 146/80 (!) 153/69  Pulse: 78 83  Resp: 18 17  Temp: 98.2 F (36.8 C) 98.1 F (36.7 C)  SpO2: 98% 100%   Per Dr Coralee 03/03/24: General: resting comfortably lying supine in hospital bed in no acute distress Cardiovascular: RRR, no murmurs, 2+ radial pulses Respiratory: CTAB, normal WOB on RA Abdomen: soft, non-tender, non-distended, BS present Extremities: no peripheral edema, moves  all extremities equally   Significant Procedures:  Capsule endoscopy 02/29/24: Multiple AVMs noted, no active bleed Small bowel enteroscopy 03/02/24:  Multiple AVMs treated  Significant Labs and Imaging:  Recent Labs  Lab 03/02/24 0808 03/03/24 0432  WBC 5.5 6.5  HGB 10.3* 9.9*  HCT 31.4* 29.0*  PLT 138* 134*   Recent Labs  Lab 03/02/24 0808 03/03/24 0432  NA 139 137  K 4.1 3.9  CL 106 105  CO2 18* 19*  GLUCOSE 108* 157*  BUN 72* 73*  CREATININE 4.30* 4.35*  CALCIUM  8.5* 8.5*     Discharge Medications:  Allergies as of 03/03/2024       Reactions   Penicillins Anaphylaxis, Other (See Comments)   Pt does not remember reaction but states he woke up in the hospital after taking        Medication List     STOP taking these medications    Mavyret  100-40 MG Tabs Generic drug: Glecaprevir -Pibrentasvir        TAKE these medications    acetaminophen  325 MG tablet Commonly known as: TYLENOL  Take 2 tablets (650 mg total) by mouth every 6 (six) hours as needed for mild pain (pain score 1-3) or fever (or Fever >/= 101).   amLODipine  10 MG tablet Commonly known as: NORVASC  Take 10 mg by mouth daily.   aspirin  EC 81 MG tablet Take 1 tablet (81 mg total) by mouth daily. Swallow whole.   atorvastatin  80 MG tablet Commonly known as: LIPITOR  Take 1 tablet (80 mg total) by mouth daily.   hydrALAZINE  25 MG tablet Commonly known as: APRESOLINE  Take 1 tablet (25 mg total) by mouth every 8 (eight) hours.   multivitamin with minerals Tabs tablet Take 1 tablet by mouth every morning.   pantoprazole  40 MG tablet Commonly known as: PROTONIX  Take 1 tablet (40 mg total) by mouth 2 (two) times daily.   SandoSTATIN  LAR [place] 20 MG injection Generic drug: octreotide  Inject 20 mg into the muscle every 28 (twenty-eight) days.   sennosides-docusate sodium  8.6-50 MG tablet Commonly known as: SENOKOT-S Take 1 tablet by mouth daily.   thiamine  100 MG tablet Commonly known as: Vitamin B-1 Take 1 tablet (100 mg total) by mouth daily.   Vitamin D (Ergocalciferol) 1.25 MG (50000 UNIT) Caps capsule Commonly known as:  DRISDOL Take 50,000 Units by mouth once a week.        Discharge Instructions: Please refer to Patient Instructions section of EMR for full details.  Patient was counseled important signs and symptoms that should prompt return to medical care, changes in medications, dietary instructions, activity restrictions, and follow up appointments.   Follow-Up Appointments:  Future Appointments  Date Time Provider Department Center  03/06/2024 12:00 PM Tina Pauletta BROCKS, MD [facility] None  03/06/2024 12:30 PM [facility] None  09/26/2024 11:50 AM [facility] ANNUAL WELLNESS VISIT [facility] [address] Romie Diona Damien, MD 03/03/2024, 12:22 PM PGY-2, [facility]

## 2024-03-03 NOTE — Assessment & Plan Note (Signed)
 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

## 2024-03-04 ENCOUNTER — Encounter (HOSPITAL_COMMUNITY): Payer: Self-pay | Admitting: Gastroenterology

## 2024-03-05 LAB — SURGICAL PATHOLOGY

## 2024-03-06 ENCOUNTER — Other Ambulatory Visit: Payer: Self-pay | Admitting: *Deleted

## 2024-03-06 ENCOUNTER — Inpatient Hospital Stay

## 2024-03-06 ENCOUNTER — Ambulatory Visit: Payer: Self-pay | Admitting: Gastroenterology

## 2024-03-06 VITALS — BP 142/73 | HR 85 | Temp 97.7°F | Resp 18 | Wt 155.0 lb

## 2024-03-06 DIAGNOSIS — K5521 Angiodysplasia of colon with hemorrhage: Secondary | ICD-10-CM | POA: Diagnosis not present

## 2024-03-06 DIAGNOSIS — B182 Chronic viral hepatitis C: Secondary | ICD-10-CM | POA: Insufficient documentation

## 2024-03-06 DIAGNOSIS — D500 Iron deficiency anemia secondary to blood loss (chronic): Secondary | ICD-10-CM

## 2024-03-06 DIAGNOSIS — N185 Chronic kidney disease, stage 5: Secondary | ICD-10-CM | POA: Insufficient documentation

## 2024-03-06 DIAGNOSIS — K5520 Angiodysplasia of colon without hemorrhage: Secondary | ICD-10-CM

## 2024-03-06 DIAGNOSIS — I129 Hypertensive chronic kidney disease with stage 1 through stage 4 chronic kidney disease, or unspecified chronic kidney disease: Secondary | ICD-10-CM | POA: Diagnosis not present

## 2024-03-06 DIAGNOSIS — F03C0 Unspecified dementia, severe, without behavioral disturbance, psychotic disturbance, mood disturbance, and anxiety: Secondary | ICD-10-CM | POA: Insufficient documentation

## 2024-03-06 DIAGNOSIS — D696 Thrombocytopenia, unspecified: Secondary | ICD-10-CM | POA: Insufficient documentation

## 2024-03-06 DIAGNOSIS — D649 Anemia, unspecified: Secondary | ICD-10-CM

## 2024-03-06 DIAGNOSIS — E1122 Type 2 diabetes mellitus with diabetic chronic kidney disease: Secondary | ICD-10-CM | POA: Diagnosis not present

## 2024-03-06 DIAGNOSIS — K922 Gastrointestinal hemorrhage, unspecified: Secondary | ICD-10-CM

## 2024-03-06 LAB — IRON AND IRON BINDING CAPACITY (CC-WL,HP ONLY)
Iron: 74 ug/dL (ref 45–182)
Saturation Ratios: 26 % (ref 17.9–39.5)
TIBC: 287 ug/dL (ref 250–450)
UIBC: 213 ug/dL

## 2024-03-06 LAB — FERRITIN: Ferritin: 181 ng/mL (ref 24–336)

## 2024-03-06 NOTE — Progress Notes (Signed)
 Pt arrived from [facility] with no attendant; packet of information provided. Bright affect; states I live with my family and they are very good to me.  RN spoke with his niece, [name] Legacy, [phone] phone, who confirmed he resides at [facility].

## 2024-03-06 NOTE — Progress Notes (Signed)
 [facility] CONSULT NOTE  Patient Care Team: Charmayne Holmes, DO as PCP - General (Internal Medicine) Shelda Atlas, MD (Internal Medicine)  ASSESSMENT & PLAN:  78 y.o. male with history of AVM of small bowel with GI bleed, chronic anemia, DM2, chronic hepatitis C, hypertension, CKD stage V, GERD, dementia, CVA referred to [facility] H[facility]ology [facility] for anemia.  Appears to have severe dementia. Spoke to [place] over the phone. Will obtain initial testing. 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 Return with him in a few weeks to go over results Assessment & Plan Normocytic anemia Ordered labs as below  Orders Placed This Encounter  Procedures   Ferritin    Standing Status:   Future    Number of Occurrences:   1    Expiration Date:   03/06/2025   Iron  and Iron  Binding Capacity ([facility] only)    Standing Status:   Future    Number of Occurrences:   1    Expiration Date:   03/06/2025   IgG, IgA, IgM    Standing Status:   Future    Number of Occurrences:   1    Expiration Date:   03/06/2025   Multiple Myeloma Panel (SPEP&IFE w/QIG)    Standing Status:   Future    Number of Occurrences:   1    Expiration Date:   04/05/2024   Kappa/lambda light chains    Standing Status:   Future    Number of Occurrences:   1    Expiration Date:   03/06/2025   Gregory JAYSON Chihuahua, MD 11/18/202511:30 PM   CHIEF COMPLAINTS/PURPOSE OF CONSULTATION:  Anemia  HISTORY OF PRESENTING ILLNESS:  Gregory Nunez. 78 y.o. male is here because of anemia.  Per chart, patient was in the emergency room in November 2025.  At that time show hemoglobin of 7.0.  Report he received 2 units of PRBC.  Received capsule endoscopy on 11/12 and showed multiple intestinal AVM causing chronic blood loss.  He underwent intestinal AVM ablation in the stomach, duodenum, and jejunum 11/14.  He was recommended to continue Protonix  and outpatient follow-up with GI.  History show intermittent acute on chronic  anemia.  Worsening a few months ago.  MCV mostly normal.  Platelet range from mild thrombocytopenia to low normal.  WBC was normal.  Hemoglobin highest was 10.7 after transfusion.  Mostly appears between 7-8.  His creatinine level seems to range from 3.6, the lowest 2 up to 4.54 since June 2025.  On 11/15 was 4.35.  Report CKD 5.  Last transferrin was low.  Iron  saturation was 30%.  No ferritin level.  Folate was 8.  B12 was 769.  M protein was not observed in 2024.  Kappa and lambda were both elevated with slightly abnormal ratio.  Recall, blood was positive in June and September of this year.  MEDICAL HISTORY:  Past Medical History:  Diagnosis Date   Acute metabolic encephalopathy 10/22/2020   Acute metabolic encephalopathy 10/22/2020   Alcohol use 04/23/2020   Alcohol withdrawal seizure with complication, with unspecified complication (HCC) 11/08/2018   Angiodysplasia of stomach and duodenum with bleeding    CAO (chronic airflow obstruction) (HCC)    Cataract    OD   Chronic kidney disease, stage 4 (severe) (HCC)    Cognitive communication deficit    CVA (cerebral vascular accident) (HCC)    Depression    Diabetes mellitus without complication (HCC)    Diabetic retinopathy (HCC)    NPDR  OU   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)    Epilepsy (HCC)    GERD (gastroesophageal reflux disease)    if drinks alcohol   GI bleed 10/05/2020   HAV (hallux abducto valgus) 01/17/2013   Patient is approximately 5-week status post bunion correction left foot   Hyperlipidemia    Hypertension    Hypertensive retinopathy    OU   Hypoglycemia 10/22/2020   Malignant neoplasm of prostate (HCC) 01/09/2014   Neuropathy    Pancreatitis    Pneumococcal vaccination administered at current visit 11/10/2020   Prostate cancer (HCC) 12/19/2013   Gleason 4+3=7, volume 31.31 cc   Rhabdomyolysis 04/12/2021   Sciatica    Shortness of breath dyspnea    with exertion     Viral hepatitis C     SURGICAL HISTORY: Past Surgical History:  Procedure Laterality Date   BIOPSY  04/16/2018   Procedure: BIOPSY;  Surgeon: Kristie Lamprey, MD;  [facility];  Service: Endoscopy;;   BIOPSY  05/23/2020   Procedure: BIOPSY;  Surgeon: Charlanne Groom, MD;  Location: [facility];  Service: Gastroenterology;;  EGD and COLON   BIOPSY  12/09/2020   Procedure: BIOPSY;  Surgeon: Eda Iha, MD;  Location: [facility] ENDOSCOPY;  Service: Gastroenterology;;   biopsy on throat     hx of    CATARACT EXTRACTION Bilateral    Dr. Medford Ferrier   COLONOSCOPY N/A 05/23/2020   Procedure: COLONOSCOPY;  Surgeon: Charlanne Groom, MD;  Location: [facility];  Service: Gastroenterology;  Laterality: N/A;   ENTEROSCOPY N/A 10/07/2020   Procedure: ENTEROSCOPY;  Surgeon: Avram Lupita BRAVO, MD;  Location: [facility] ENDOSCOPY;  Service: Endoscopy;  Laterality: N/A;   ENTEROSCOPY N/A 10/03/2023   Procedure: ENTEROSCOPY;  Surgeon: San Sandor GAILS, DO;  Location: [facility] ENDOSCOPY;  Service: Gastroenterology;  Laterality: N/A;   ENTEROSCOPY N/A 10/14/2023   Procedure: ENTEROSCOPY;  Surgeon: Leigh Elspeth SQUIBB, MD;  Location: [facility] ENDOSCOPY;  Service: Gastroenterology;  Laterality: N/A;   ENTEROSCOPY N/A 03/02/2024   Procedure: ENTEROSCOPY;  Surgeon: Leigh Elspeth SQUIBB, MD;  Location: [facility] ENDOSCOPY;  Service: Gastroenterology;  Laterality: N/A;   ESOPHAGOGASTRODUODENOSCOPY Left 04/16/2018   Procedure: ESOPHAGOGASTRODUODENOSCOPY (EGD);  Surgeon: Kristie Lamprey, MD;  Location: [facility];  Service: Endoscopy;  Laterality: Left;   ESOPHAGOGASTRODUODENOSCOPY (EGD) WITH PROPOFOL  N/A 05/23/2020   Procedure: ESOPHAGOGASTRODUODENOSCOPY (EGD) WITH PROPOFOL ;  Surgeon: Charlanne Groom, MD;  [facility];  Service: Gastroenterology;  Laterality: N/A;   ESOPHAGOGASTRODUODENOSCOPY (EGD) WITH PROPOFOL  N/A 12/09/2020   Procedure: ESOPHAGOGASTRODUODENOSCOPY (EGD) WITH PROPOFOL ;  Surgeon: Eda Iha, MD;  Location: [facility]  ENDOSCOPY;  Service: Gastroenterology;  Laterality: N/A;   EYE SURGERY     FOOT SURGERY     GIVENS CAPSULE STUDY N/A 11/25/2023   Procedure: IMAGING PROCEDURE, GI TRACT, INTRALUMINAL, VIA CAPSULE;  Surgeon: Albertus Gordy HERO, MD;  Location: [facility] ENDOSCOPY;  Service: Gastroenterology;  Laterality: N/A;   GIVENS CAPSULE STUDY N/A 02/29/2024   Procedure: IMAGING PROCEDURE, GI TRACT, INTRALUMINAL, VIA CAPSULE;  Surgeon: Leigh Elspeth SQUIBB, MD;  Location: [facility]OS[facility];  Service: Gastroenterology;  Laterality: N/A;   HOT HEMOSTASIS N/A 04/16/2018   Procedure: HOT HEMOSTASIS (ARGON PLASMA COAGULATION/BICAP);  Surgeon: Kristie Lamprey, MD;  Location: [facility];  Service: Endoscopy;  Laterality: N/A;   HOT HEMOSTASIS N/A 05/23/2020   Procedure: HOT HEMOSTASIS (ARGON PLASMA COAGULATION/BICAP);  Surgeon: Charlanne Groom, MD;  Location: [facility];  Service: Gastroenterology;  Laterality: N/A;   HOT HEMOSTASIS N/A 12/09/2020   Procedure: HOT HEMOSTASIS (ARGON PLASMA  COAGULATION/BICAP);  Surgeon: Eda Iha, MD;  Location: [facility] ENDOSCOPY;  Service: Gastroenterology;  Laterality: N/A;   HOT HEMOSTASIS N/A 10/03/2023   Procedure: EGD, WITH ARGON PLASMA COAGULATION;  Surgeon: San Sandor GAILS, DO;  Location: [facility];  Service: Gastroenterology;  Laterality: N/A;   HOT HEMOSTASIS N/A 10/14/2023   Procedure: EGD, WITH ARGON PLASMA COAGULATION;  Surgeon: Leigh Elspeth SQUIBB, MD;  Location: [facility] ENDOSCOPY;  Service: Gastroenterology;  Laterality: N/A;   LYMPHADENECTOMY Bilateral 02/27/2014   Procedure: BILATERAL LYMPHADENECTOMY;  Surgeon: Ricardo Likens, MD;  Location: [facility];  Service: Urology;  Laterality: Bilateral;   POLYPECTOMY  05/23/2020   Procedure: POLYPECTOMY;  Surgeon: Charlanne Groom, MD;  Location[facility];  Service: Gastroenterology;;   PROSTATE BIOPSY  12/2013   Gleason 4+3=7, volume 31.31 cc   ROBOT ASSISTED LAPAROSCOPIC RADICAL PROSTATECTOMY N/A 02/27/2014   Procedure: ROBOTIC ASSISTED  LAPAROSCOPIC RADICAL PROSTATECTOMY WITH INDOCYANINE GREEN DYE;  Surgeon: Ricardo Likens, MD;  Location: [facility];  Service: Urology;  Laterality: N/A;    SOCIAL HISTORY: Social History   Socioeconomic History   Marital status: Married    Spouse name: Not on file   Number of children: Not on file   Years of education: Not on file   Highest education level: Not on file  Occupational History   Not on file  Tobacco Use   Smoking status: Former    Current packs/day: 0.50    Types: Cigarettes   Smokeless tobacco: Never   Tobacco comments:    1 ppd Wants Patches .  Stopped 1.5 weeks ago   Vaping Use   Vaping status: Never Used  Substance and Sexual Activity   Alcohol use: Not Currently    Alcohol/week: 1.0 standard drink of alcohol    Types: 1 Cans of beer per week   Drug use: Not Currently    Types: Marijuana, Cocaine, Heroin    Comment: past hx approx 30 years ago    Sexual activity: Not on file  Other Topics Concern   Not on file  Social History Narrative   Not on file   Social Drivers of Health   Financial Resource Strain: Low Risk  (09/21/2023)   Overall Financial Resource Strain (CARDIA)    Difficulty of Paying Living Expenses: Not hard at all  Food Insecurity: Patient Unable To Answer (02/29/2024)   Hunger Vital Sign    Worried About Running Out of Food in the Last Year: Patient unable to answer    Ran Out of Food in the Last Year: Patient unable to answer  Transportation Needs: No Transportation Needs (02/29/2024)   PRAPARE - Administrator, Civil Service (Medical): No    Lack of Transportation (Non-Medical): No  Physical Activity: Insufficiently Active (09/21/2023)   Exercise Vital Sign    Days of Exercise per Week: 2 days    Minutes of Exercise per Session: 30 min  Stress: No Stress Concern Present (09/21/2023)   [employer] of Occupational Health - Occupational Stress Questionnaire    Feeling of Stress : Not at all  Social Connections: Socially  Isolated (02/29/2024)   Social Connection and Isolation Panel    Frequency of Communication with Friends and Family: Never    Frequency of Social Gatherings with Friends and Family: Never    Attends Religious Services: Never    Database Administrator or Organizations: No    Attends Banker Meetings: Never    Marital Status: Divorced  Catering Manager Violence: Not At Risk (  02/29/2024)   Humiliation, Afraid, Rape, and Kick questionnaire    Fear of Current or Ex-Partner: No    Emotionally Abused: No    Physically Abused: No    Sexually Abused: No    FAMILY HISTORY: Family History  Problem Relation Age of Onset   Heart disease Mother    Heart attack Father 67   Cancer Sister        breast   Colon cancer Neg Hx    Esophageal cancer Neg Hx    Rectal cancer Neg Hx    Stomach cancer Neg Hx     ALLERGIES:  is allergic to penicillins.  MEDICATIONS:  Current Outpatient Medications  Medication Sig Dispense Refill   acetaminophen  (TYLENOL ) 325 MG tablet Take 2 tablets (650 mg total) by mouth every 6 (six) hours as needed for mild pain (pain score 1-3) or fever (or Fever >/= 101). 30 tablet 0   amLODipine  (NORVASC ) 10 MG tablet Take 10 mg by mouth daily.     aspirin  EC 81 MG tablet Take 1 tablet (81 mg total) by mouth daily. Swallow whole.     atorvastatin  (LIPITOR ) 80 MG tablet Take 1 tablet (80 mg total) by mouth daily.     hydrALAZINE  (APRESOLINE ) 25 MG tablet Take 1 tablet (25 mg total) by mouth every 8 (eight) hours. 270 tablet 3   Multiple Vitamin (MULTIVITAMIN WITH MINERALS) TABS tablet Take 1 tablet by mouth every morning.     octreotide  (SANDOSTATIN  LAR) 20 MG injection Inject 20 mg into the muscle every 28 (twenty-eight) days. 1 each 2   pantoprazole  (PROTONIX ) 40 MG tablet Take 1 tablet (40 mg total) by mouth 2 (two) times daily.     sennosides-docusate sodium  (SENOKOT-S) 8.6-50 MG tablet Take 1 tablet by mouth daily.     thiamine  (VITAMIN B-1) 100 MG tablet Take  1 tablet (100 mg total) by mouth daily.     Vitamin D, Ergocalciferol, (DRISDOL) 1.25 MG (50000 UNIT) CAPS capsule Take 50,000 Units by mouth once a week.     No current facility-administered medications for this visit.    REVIEW OF SYSTEMS:   All relevant systems were reviewed with the patient and are negative.  PHYSICAL EXAMINATION:  Vitals:   03/06/24 1135 03/06/24 1146  BP: (!) 149/79 (!) 142/73  Pulse: 85   Resp: 18   Temp: 97.7 F (36.5 C)   SpO2: 100%    Filed Weights   03/06/24 1135  Weight: 155 lb (70.3 kg)    GENERAL: alert, no distress and comfortable on wheelchair NECK: supple LYMPH:  no palpable cervical, axillary lymphadenopathy LUNGS: clear to auscultation with normal breathing effort HEART: regular rate & rhythm and no murmurs and no lower extremity edema ABDOMEN: abdomen soft, non-tender and nondistended

## 2024-03-07 ENCOUNTER — Telehealth: Payer: Self-pay

## 2024-03-07 LAB — KAPPA/LAMBDA LIGHT CHAINS
Kappa free light chain: 232.5 mg/L — ABNORMAL HIGH (ref 3.3–19.4)
Kappa, lambda light chain ratio: 2.42 — ABNORMAL HIGH (ref 0.26–1.65)
Lambda free light chains: 96 mg/L — ABNORMAL HIGH (ref 5.7–26.3)

## 2024-03-07 LAB — IGG, IGA, IGM
IgA: 250 mg/dL (ref 61–437)
IgG (Immunoglobin G), Serum: 2389 mg/dL — ABNORMAL HIGH (ref 603–1613)
IgM (Immunoglobulin M), Srm: 52 mg/dL (ref 15–143)

## 2024-03-07 NOTE — Telephone Encounter (Signed)
-----   Message from [facility] sent at 03/02/2024 12:31 PM EST ----- Regarding: FW: follow up labs Order is in.  Cbc Due end of this week ----- Message ----- From: Leigh Elspeth SQUIBB, MD Sent: 03/02/2024  11:27 AM EST To: Clarita CHRISTELLA Favre, CMA Subject: follow up labs                                 Jan can you help coordinate a CBC in one week for this patient - will need to communicate with his family, patient has dementia. Thanks. Can contact them next week, he will be in the hospital today. Thanks

## 2024-03-07 NOTE — Telephone Encounter (Signed)
 Called and spoke to pt's nephew.  He will see that patient gets to the lab on Friday.  Order is in.

## 2024-03-08 ENCOUNTER — Ambulatory Visit: Payer: Self-pay

## 2024-03-08 ENCOUNTER — Other Ambulatory Visit

## 2024-03-08 ENCOUNTER — Ambulatory Visit: Payer: Self-pay | Admitting: Gastroenterology

## 2024-03-08 DIAGNOSIS — D500 Iron deficiency anemia secondary to blood loss (chronic): Secondary | ICD-10-CM

## 2024-03-08 DIAGNOSIS — K922 Gastrointestinal hemorrhage, unspecified: Secondary | ICD-10-CM

## 2024-03-08 DIAGNOSIS — K5520 Angiodysplasia of colon without hemorrhage: Secondary | ICD-10-CM

## 2024-03-08 LAB — MULTIPLE MYELOMA PANEL, SERUM
Albumin SerPl Elph-Mcnc: 3.3 g/dL (ref 2.9–4.4)
Albumin/Glob SerPl: 0.8 (ref 0.7–1.7)
Alpha 1: 0.3 g/dL (ref 0.0–0.4)
Alpha2 Glob SerPl Elph-Mcnc: 1 g/dL (ref 0.4–1.0)
B-Globulin SerPl Elph-Mcnc: 1 g/dL (ref 0.7–1.3)
Gamma Glob SerPl Elph-Mcnc: 2.2 g/dL — ABNORMAL HIGH (ref 0.4–1.8)
Globulin, Total: 4.5 g/dL — ABNORMAL HIGH (ref 2.2–3.9)
IgA: 254 mg/dL (ref 61–437)
IgG (Immunoglobin G), Serum: 2369 mg/dL — ABNORMAL HIGH (ref 603–1613)
IgM (Immunoglobulin M), Srm: 54 mg/dL (ref 15–143)
Total Protein ELP: 7.8 g/dL (ref 6.0–8.5)

## 2024-03-08 LAB — CBC WITH DIFFERENTIAL/PLATELET
Basophils Absolute: 0 K/uL (ref 0.0–0.1)
Basophils Relative: 0.5 % (ref 0.0–3.0)
Eosinophils Absolute: 0.2 K/uL (ref 0.0–0.7)
Eosinophils Relative: 3.5 % (ref 0.0–5.0)
HCT: 28.6 % — ABNORMAL LOW (ref 39.0–52.0)
Hemoglobin: 9.7 g/dL — ABNORMAL LOW (ref 13.0–17.0)
Lymphocytes Relative: 18.6 % (ref 12.0–46.0)
Lymphs Abs: 0.9 K/uL (ref 0.7–4.0)
MCHC: 33.8 g/dL (ref 30.0–36.0)
MCV: 91.2 fl (ref 78.0–100.0)
Monocytes Absolute: 0.4 K/uL (ref 0.1–1.0)
Monocytes Relative: 9.2 % (ref 3.0–12.0)
Neutro Abs: 3.2 K/uL (ref 1.4–7.7)
Neutrophils Relative %: 68.2 % (ref 43.0–77.0)
Platelets: 138 K/uL — ABNORMAL LOW (ref 150.0–400.0)
RBC: 3.14 Mil/uL — ABNORMAL LOW (ref 4.22–5.81)
RDW: 14.8 % (ref 11.5–15.5)
WBC: 4.7 K/uL (ref 4.0–10.5)

## 2024-03-29 NOTE — Progress Notes (Unsigned)
 [facility] OFFICE PROGRESS NOTE  Patient Care Team: Charmayne Holmes, DO as PCP - General (Internal Medicine) Shelda Atlas, MD (Internal Medicine)  78 y.o. male with history of AVM of small bowel with GI bleed, chronic anemia, DM2, chronic hepatitis C, hypertension, CKD stage V, GERD, dementia, CVA referred to [facility] H[facility]ology [facility] Oncology for anemia.   Appears to have severe dementia. Spoke to [place] over the phone. Will obtain initial testing. 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 Return with him in a few weeks to go over results  Lab from November showed chronic kidney disease, normal ferritin, B12, folate.  SPEP showed polyclonal increase in immunoglobulin and M protein was not observed.  IgG was elevated.  Hemoglobin was 9.7 with normal MCV.  Kappa was 232.5 and lambda 96.  K/L 2.42.  Assessment & Plan   No orders of the defined types were placed in this encounter.    Pauletta JAYSON Chihuahua, MD  INTERVAL HISTORY: Patient returns for follow-up.  Oncology History   No problem history exists.     PHYSICAL EXAMINATION: ECOG PERFORMANCE STATUS: {CHL ONC ECOG PS:[phone]}  There were no vitals filed for this visit. There were no vitals filed for this visit.  GENERAL: alert, no distress and comfortable SKIN: skin color normal and no jaundice or bruising or petechiae on exposed skin EYES: normal, sclera clear OROPHARYNX: no exudate  NECK: No palpable mass LYMPH:  no palpable cervical, axillary lymphadenopathy  LUNGS: clear to auscultation and no wheeze or rales with normal breathing effort HEART: regular rate & rhythm  ABDOMEN: abdomen soft, non-tender and nondistended. Musculoskeletal: no edema NEURO: no focal motor/sensory deficits  Relevant data reviewed during this visit included labs.  New labs ordered.

## 2024-03-30 ENCOUNTER — Inpatient Hospital Stay

## 2024-03-30 VITALS — BP 143/71 | HR 90 | Temp 97.7°F | Resp 16 | Ht 69.0 in

## 2024-03-30 DIAGNOSIS — D631 Anemia in chronic kidney disease: Secondary | ICD-10-CM | POA: Diagnosis present

## 2024-03-30 DIAGNOSIS — N185 Chronic kidney disease, stage 5: Secondary | ICD-10-CM | POA: Diagnosis present

## 2024-03-30 DIAGNOSIS — Z993 Dependence on wheelchair: Secondary | ICD-10-CM | POA: Diagnosis not present

## 2024-03-30 DIAGNOSIS — Z7189 Other specified counseling: Secondary | ICD-10-CM | POA: Insufficient documentation

## 2024-03-30 NOTE — Assessment & Plan Note (Signed)
 Currently stable May continue to treat underlying disease Transfuse if symptomatic or hemoglobin less than 7 Follow-up with iron  and ferritin periodically to see if any deficiency

## 2024-03-30 NOTE — Assessment & Plan Note (Addendum)
 Discussed with DPOA Michael Gleed over the phone. Recommend palliative care consult and follow-up with PCP.

## 2024-09-26 ENCOUNTER — Ambulatory Visit
